# Patient Record
Sex: Female | Born: 1970 | Race: White | Hispanic: No | Marital: Married | Smoking: Never smoker
Health system: Southern US, Community
[De-identification: ages and names within clinical notes are randomized; demographics above are authoritative.]

## PROBLEM LIST (undated history)

## (undated) DIAGNOSIS — I10 Essential (primary) hypertension: Secondary | ICD-10-CM

## (undated) DIAGNOSIS — M1990 Unspecified osteoarthritis, unspecified site: Secondary | ICD-10-CM

## (undated) DIAGNOSIS — Z171 Estrogen receptor negative status [ER-]: Principal | ICD-10-CM

## (undated) DIAGNOSIS — T4145XA Adverse effect of unspecified anesthetic, initial encounter: Secondary | ICD-10-CM

## (undated) DIAGNOSIS — T8859XA Other complications of anesthesia, initial encounter: Secondary | ICD-10-CM

## (undated) DIAGNOSIS — F419 Anxiety disorder, unspecified: Secondary | ICD-10-CM

## (undated) DIAGNOSIS — C50412 Malignant neoplasm of upper-outer quadrant of left female breast: Principal | ICD-10-CM

## (undated) DIAGNOSIS — C50919 Malignant neoplasm of unspecified site of unspecified female breast: Secondary | ICD-10-CM

## (undated) DIAGNOSIS — K5900 Constipation, unspecified: Secondary | ICD-10-CM

## (undated) DIAGNOSIS — Z923 Personal history of irradiation: Secondary | ICD-10-CM

## (undated) DIAGNOSIS — F32A Depression, unspecified: Secondary | ICD-10-CM

## (undated) HISTORY — DX: Depression, unspecified: F32.A

## (undated) HISTORY — DX: Constipation, unspecified: K59.00

## (undated) HISTORY — DX: Anxiety disorder, unspecified: F41.9

## (undated) HISTORY — DX: Estrogen receptor negative status (ER-): Z17.1

## (undated) HISTORY — DX: Malignant neoplasm of upper-outer quadrant of left female breast: C50.412

## (undated) HISTORY — PX: TUBAL LIGATION: SHX77

## (undated) HISTORY — PX: ANKLE SURGERY: SHX546

## (undated) HISTORY — PX: WISDOM TOOTH EXTRACTION: SHX21

---
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
## 1999-04-13 ENCOUNTER — Inpatient Hospital Stay (HOSPITAL_COMMUNITY): Admission: AD | Admit: 1999-04-13 | Discharge: 1999-04-13 | Payer: Self-pay | Admitting: Obstetrics & Gynecology

## 1999-04-16 ENCOUNTER — Other Ambulatory Visit: Admission: RE | Admit: 1999-04-16 | Discharge: 1999-04-16 | Payer: Self-pay | Admitting: Obstetrics & Gynecology

## 1999-12-06 ENCOUNTER — Ambulatory Visit (HOSPITAL_COMMUNITY): Admission: RE | Admit: 1999-12-06 | Discharge: 1999-12-06 | Payer: Self-pay | Admitting: Obstetrics and Gynecology

## 1999-12-06 ENCOUNTER — Encounter (INDEPENDENT_AMBULATORY_CARE_PROVIDER_SITE_OTHER): Payer: Self-pay | Admitting: Specialist

## 2005-03-02 ENCOUNTER — Ambulatory Visit: Payer: Self-pay | Admitting: Cardiovascular Disease

## 2005-03-02 ENCOUNTER — Observation Stay (HOSPITAL_COMMUNITY): Admission: EM | Admit: 2005-03-02 | Discharge: 2005-03-03 | Payer: Self-pay | Admitting: Emergency Medicine

## 2005-03-03 ENCOUNTER — Encounter: Payer: Self-pay | Admitting: Cardiovascular Disease

## 2008-04-29 ENCOUNTER — Emergency Department (HOSPITAL_BASED_OUTPATIENT_CLINIC_OR_DEPARTMENT_OTHER): Admission: EM | Admit: 2008-04-29 | Discharge: 2008-04-30 | Payer: Self-pay | Admitting: Emergency Medicine

## 2008-04-30 ENCOUNTER — Ambulatory Visit: Payer: Self-pay | Admitting: Diagnostic Radiology

## 2009-01-07 ENCOUNTER — Ambulatory Visit: Payer: Self-pay | Admitting: Diagnostic Radiology

## 2009-01-07 ENCOUNTER — Emergency Department (HOSPITAL_BASED_OUTPATIENT_CLINIC_OR_DEPARTMENT_OTHER): Admission: EM | Admit: 2009-01-07 | Discharge: 2009-01-07 | Payer: Self-pay | Admitting: Emergency Medicine

## 2009-01-10 ENCOUNTER — Ambulatory Visit (HOSPITAL_BASED_OUTPATIENT_CLINIC_OR_DEPARTMENT_OTHER): Admission: RE | Admit: 2009-01-10 | Discharge: 2009-01-11 | Payer: Self-pay | Admitting: Orthopedic Surgery

## 2009-04-24 ENCOUNTER — Ambulatory Visit: Payer: Self-pay | Admitting: Diagnostic Radiology

## 2009-04-24 ENCOUNTER — Emergency Department (HOSPITAL_BASED_OUTPATIENT_CLINIC_OR_DEPARTMENT_OTHER): Admission: EM | Admit: 2009-04-24 | Discharge: 2009-04-24 | Payer: Self-pay | Admitting: Emergency Medicine

## 2010-01-04 ENCOUNTER — Emergency Department (HOSPITAL_BASED_OUTPATIENT_CLINIC_OR_DEPARTMENT_OTHER)
Admission: EM | Admit: 2010-01-04 | Discharge: 2010-01-05 | Payer: Self-pay | Source: Home / Self Care | Admitting: Emergency Medicine

## 2010-01-26 ENCOUNTER — Encounter: Payer: Self-pay | Admitting: Family Medicine

## 2010-03-17 LAB — CBC
HCT: 37.1 % (ref 36.0–46.0)
Hemoglobin: 12.8 g/dL (ref 12.0–15.0)
MCH: 28.6 pg (ref 26.0–34.0)
MCHC: 34.5 g/dL (ref 30.0–36.0)
MCV: 83 fL (ref 78.0–100.0)
Platelets: 209 10*3/uL (ref 150–400)
RBC: 4.47 MIL/uL (ref 3.87–5.11)
RDW: 13.2 % (ref 11.5–15.5)
WBC: 5.7 K/uL (ref 4.0–10.5)

## 2010-03-17 LAB — COMPREHENSIVE METABOLIC PANEL WITH GFR
ALT: 32 U/L (ref 0–35)
AST: 29 U/L (ref 0–37)
Alkaline Phosphatase: 117 U/L (ref 39–117)
BUN: 8 mg/dL (ref 6–23)
CO2: 24 meq/L (ref 19–32)
Chloride: 104 meq/L (ref 96–112)
GFR calc Af Amer: >60 mL/min (ref >60)
GFR calc non Af Amer: >60 mL/min (ref >60)
Glucose, Bld: 102 mg/dL — ABNORMAL HIGH (ref 70–99)
Total Protein: 7.5 g/dL (ref 6.0–8.3)

## 2010-03-17 LAB — DIFFERENTIAL
Basophils Absolute: 0 K/uL (ref 0.0–0.1)
Basophils Relative: 1 % (ref 0–1)
Eosinophils Absolute: 0.1 10*3/uL (ref 0.0–0.7)
Eosinophils Relative: 1 % (ref 0–5)
Lymphocytes Relative: 26 % (ref 12–46)
Lymphs Abs: 1.5 10*3/uL (ref 0.7–4.0)
Monocytes Absolute: 0.4 10*3/uL (ref 0.1–1.0)
Monocytes Relative: 8 % (ref 3–12)
Neutro Abs: 3.7 10*3/uL (ref 1.7–7.7)
Neutrophils Relative %: 65 % (ref 43–77)

## 2010-03-17 LAB — COMPREHENSIVE METABOLIC PANEL
Albumin: 4.1 g/dL (ref 3.5–5.2)
Calcium: 9.1 mg/dL (ref 8.4–10.5)
Creatinine, Ser: 0.6 mg/dL (ref 0.4–1.2)
Potassium: 4.1 mEq/L (ref 3.5–5.1)
Sodium: 139 mEq/L (ref 135–145)
Total Bilirubin: 1 mg/dL (ref 0.3–1.2)

## 2010-03-17 LAB — URINALYSIS, ROUTINE W REFLEX MICROSCOPIC
Glucose, UA: NEGATIVE mg/dL
Hgb urine dipstick: NEGATIVE
Ketones, ur: NEGATIVE mg/dL
Nitrite: NEGATIVE
Protein, ur: NEGATIVE mg/dL
Specific Gravity, Urine: 1.025 (ref 1.005–1.030)
Urobilinogen, UA: 0.2 mg/dL (ref 0.0–1.0)
pH: 5.5 (ref 5.0–8.0)

## 2010-03-17 LAB — LIPASE, BLOOD: Lipase: 103 U/L (ref 23–300)

## 2010-03-17 LAB — PREGNANCY, URINE: Preg Test, Ur: NEGATIVE

## 2010-03-25 LAB — POCT CARDIAC MARKERS
CKMB, poc: <1 ng/mL — ABNORMAL LOW (ref 1.0–8.0)
Myoglobin, poc: 50.7 ng/mL (ref 12–200)
Troponin i, poc: <0.05 ng/mL (ref 0.00–0.09)

## 2010-03-25 LAB — BASIC METABOLIC PANEL WITH GFR
BUN: 7 mg/dL (ref 6–23)
Calcium: 9.1 mg/dL (ref 8.4–10.5)
GFR calc Af Amer: >60 mL/min (ref >60)
Glucose, Bld: 102 mg/dL — ABNORMAL HIGH (ref 70–99)
Potassium: 3.6 meq/L (ref 3.5–5.1)
Sodium: 142 meq/L (ref 135–145)

## 2010-03-25 LAB — CBC
HCT: 38.7 % (ref 36.0–46.0)
Hemoglobin: 13.2 g/dL (ref 12.0–15.0)
MCHC: 34.1 g/dL (ref 30.0–36.0)
MCV: 85.3 fL (ref 78.0–100.0)
Platelets: 261 10*3/uL (ref 150–400)
RBC: 4.54 MIL/uL (ref 3.87–5.11)
RDW: 12.7 % (ref 11.5–15.5)
WBC: 5.1 K/uL (ref 4.0–10.5)

## 2010-03-25 LAB — PREGNANCY, URINE: Preg Test, Ur: NEGATIVE

## 2010-03-25 LAB — URINALYSIS, ROUTINE W REFLEX MICROSCOPIC
Glucose, UA: NEGATIVE mg/dL
Hgb urine dipstick: NEGATIVE
Ketones, ur: 15 mg/dL — AB
Nitrite: NEGATIVE
Protein, ur: NEGATIVE mg/dL
Specific Gravity, Urine: 1.031 — ABNORMAL HIGH (ref 1.005–1.030)
Urobilinogen, UA: 1 mg/dL (ref 0.0–1.0)
pH: 5.5 (ref 5.0–8.0)

## 2010-03-25 LAB — DIFFERENTIAL
Basophils Absolute: 0 K/uL (ref 0.0–0.1)
Basophils Relative: 1 % (ref 0–1)
Eosinophils Absolute: 0.1 K/uL (ref 0.0–0.7)
Eosinophils Relative: 2 % (ref 0–5)
Lymphocytes Relative: 27 % (ref 12–46)
Lymphs Abs: 1.4 10*3/uL (ref 0.7–4.0)
Monocytes Absolute: 0.3 K/uL (ref 0.1–1.0)
Monocytes Relative: 5 % (ref 3–12)
Neutro Abs: 3.3 K/uL (ref 1.7–7.7)
Neutrophils Relative %: 66 % (ref 43–77)

## 2010-03-25 LAB — URINE MICROSCOPIC-ADD ON

## 2010-03-25 LAB — BASIC METABOLIC PANEL
CO2: 29 mEq/L (ref 19–32)
Chloride: 102 mEq/L (ref 96–112)
Creatinine, Ser: 0.7 mg/dL (ref 0.4–1.2)
GFR calc non Af Amer: >60 mL/min (ref >60)

## 2010-03-25 LAB — D-DIMER, QUANTITATIVE: D-Dimer, Quant: 0.37 ug/mL-FEU (ref 0.00–0.48)

## 2010-05-23 NOTE — H&P (Signed)
[name], [name]              ACCOUNT NO.:  [id number]   MEDICAL RECORD NO.:  [id number]          PATIENT TYPE:  INP   LOCATION:  5511                         FACILITY:  [facility]   PHYSICIAN:  Charlton Haws, M.D.     DATE OF BIRTH:  10/01/1970   DATE OF ADMISSION:  03/02/2005  DATE OF DISCHARGE:                                HISTORY & PHYSICAL   CARDIOLOGIST:  New, being seen by Dr. Maurine Cane   PRIMARY CARE PHYSICIAN:  None.  Patient previously was followed by Dr. Cliffton Asters  in [place] prior to moving to [place].   She was admitted by Dr. Julio Sicks   We were asked to consult secondary to chest pain.   Ms. Luscher is a 40 year old Caucasian female with no known coronary artery  disease history who complains of chest discomfort episodes approximately for  four months, however, has postponed going to the doctor until yesterday.  She states chest discomfort is not similar to the pleuritic discomfort she  had recently with bronchitis in which she was treated with steroids and  antibiotics in December of 2006.  This discomfort is more of an intense  sharp pain associated with palpitations at times.  Yesterday she experienced  episode around 5 p.m. while she was studying.  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  She  complains of episodes of palpitations with her heart racing to the point  that she states she fell down the steps back in December with episode, but  denied any syncopal episodes with this.  Really not sure clarification of  exactly what made her fall.  Her chest discomfort yesterday was above her  left breast.  She states it is sharp pain radiating up into her neck and  down her back.  She states she did become short of breath.  She did have  palpitations with this.  She denied any lightheadedness or dizziness.  She  states she does  have a headache.  When she has the chest discomfort it feels  like an intense pounding like her blood pressure is up.  She states she  rested for a while.  The chest pain eased off.  Initially she rated it an 8-  9 on a scale of 1-10, sharp, intense pressure.  Lasted around five to six  hours.  She states it waxed and waned throughout the evening.  However, the  chest pain did not resolve until after the IV nitroglycerin was initiated in  the emergency room and it did not resolve at that time until 45 minutes  after the nitroglycerin had been initiated.  Blood pressure initially in the  emergency room was 149/97.  Patient also received Toradol in the emergency  room and sublingual nitroglycerin prior to the drip being initiated.  She  also received a Xopenex nebulizer treatment secondary to wheezing and 1 mg  of morphine IV in the emergency department.  Currently she is rating her  chest discomfort around a 3 or 4.  She is resting quietly on the bed without  any frank discomfort.   ALLERGIES:  No known drug allergies.   MEDICATIONS:  At home, none.  Here she is receiving Proventil inhaler,  aspirin 325, Toradol, Xopenex, nitroglycerin.  She also has morphine p.r.n.  IV.   PAST MEDICAL HISTORY:  1.  Pregnancy induced hypertension with episodes of preeclampsia.  Patient      states blood pressure has returned to normal since pregnancies.      Cholesterol questionable mild elevation; however, patient states it was      okay five years ago.  2.  Recent bronchitis treated with steroids and antibiotics.   SOCIAL HISTORY:  She lives in [place] with her husband.  She is a Biomedical scientist.  She has four children.  She denies any tobacco, ETOH, drug, or  herbal medication use.  She is on a regular diet.  No routine exercise.   FAMILY HISTORY:  Mother alive with hypertension, obesity.  Father alive with  CHF and atrial fibrillation, borderline diabetes.  A brother with  hypertension.    REVIEW OF SYSTEMS:  Positive for sweats, headache, chest pain, shortness of  breath, dyspnea on exertion, edema, palpitations, questionable  presyncope/syncopal episode with falling down steps.  Positive for wheezing.  Positive for nausea.   PHYSICAL EXAMINATION:  VITAL SIGNS:  Temperature 98, pulse 82, respirations  20, blood pressure 149/97, saturation 95% on room air.  GENERAL:  She is in no acute distress.  HEENT:  Pupils are equal, round, and reactive to light.  Sclera is clear.  NECK:  Without lymphadenopathy.  Negative bruit or JVD.  CARDIOVASCULAR:  Regular rate and rhythm.  S1, S2.  Pulses are 2+ and equal  without bruits.  LUNGS:  Clear to auscultation bilaterally.  ABDOMEN:  Soft, nontender.  Positive bowel sounds.  EXTREMITIES:  Without clubbing, cyanosis, edema.  She has 2+ DPs bilateral.  NEUROLOGIC:  Alert and oriented x3.  Cranial nerves II-XII grossly intact.   Chest x-ray showing mild peribronchial thickening without focal air space  disease.  EKG:  Sinus rhythm at 81 without acute changes.  Hemoglobin 13.6,  hematocrit 40.  Sodium 139, potassium 3.7, BUN 10, creatinine 0.8 with a  glucose of 115.  D-dimer less than 0.22.  Cardiac enzymes:  First set  troponin 0.01.   Dr. Maurine Cane in to examine and assess patient with atypical chest pain in  setting of recent bronchitis and increased psychosocial stress at home, job,  and school.   PLAN:  Rule out myocardial infarction.  Discontinue her nitroglycerin.  We  will use Percocet and Toradol for pain.  Will plan on obtaining a CT of the  chest and echocardiogram today and doing a stress Myoview inpatient  tomorrow.      Dorian Pod, NP    ______________________________  Charlton Haws, M.D.    MB/MEDQ  D:  03/02/2005  T:  03/02/2005  Job:  [id number]

## 2010-05-23 NOTE — Op Note (Signed)
[facility]  Patient:    [name], [name]                          MRN: [id number] Proc. Date: 12/06/99 Adm. Date:  [id number] Attending:  Miguel Aschoff                           Operative Report  PREOPERATIVE DIAGNOSIS:       Missed abortion.  POSTOPERATIVE DIAGNOSIS:      Missed abortion.  OPERATION:                    Suction dilation and curettage.  SURGEON:                      Dr. Miguel Aschoff.  ANESTHESIA:                   IV sedation with paracervical block.  COMPLICATIONS:                None.  ESTIMATED BLOOD LOSS:         Approximately 50 cc.  INDICATIONS:                  Patient is a 40 year old white female, gravida 4, para 2-0-1-2 now approximately eight to nine weeks pregnant. The patient has had serial HCG values drawn which have fallen. Follow-up ultrasound has confirmed the presence on an an-embryonic pregnancy. In view of the falling HCGs and the abnormal ultrasound she presents now to undergo suction D&C to evacuate the uterus of this abnormal pregnancy.  DESCRIPTION OF PROCEDURE:     Patient was taken to the operating room, placed in the supine position, and IV sedation was administered. She was then placed in the dorsal lithotomy position and prepped and draped in the usual sterile fashion. Speculum was placed in the vaginal vault and anterior cervical lip was grasped with the tenaculum and then injected with 18 cc 1% Xylocaine by placing 6 cc at the 12 oclock, 4 oclock and 8 oclock positions. After this was done.  Serial Pratt dilators were used until the endocervical canal was dilated up to a 25 Pratt dilator; then a #8 vacuum curet was placed in the uterine cavity and contents of the uterus were evacuated without difficulty. After this was done sharp curettage was carried out yielding the small amount of additional tissue then a final pass was made with the vacuum curet. At this point with no additional tissue being returned  and good hemostasis, all instruments were removed, the patient was taken out of the lithotomy position and brought to the recovery room in satisfactory condition.  Plan is for the patient is to be discharged home. Medications for home include Darvocet-N 100 one every six hours as needed for pain and doxycycline 100 mg twice a day x 3 days. She is to call if there are any problems such as fever, pain, or heavy bleeding. She is instructed to set up a follow-up visit in the office for four weeks. DD:  12/06/99 TD:  12/06/99 Job: [id number]

## 2010-05-23 NOTE — Consult Note (Signed)
[name], [name]              ACCOUNT NO.:  [id number]   MEDICAL RECORD NO.:  [id number]          PATIENT TYPE:  INP   LOCATION:  5511                         FACILITY:  [facility]   PHYSICIAN:  Charlton Haws, M.D.     DATE OF BIRTH:  10/14/1970   DATE OF CONSULTATION:  DATE OF DISCHARGE:                                   CONSULTATION   Consult for chest pain.   [name] is a 40 year old patient admitted earlier this morning by  primary care.   The patient had substernal chest pain.   Her only past medical history is really some preeclampsia with one of her  pregnancies.  She has four healthy children.   The patient has been recovering from some bronchitis and completed a course  of antibiotics and steroids recently.   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   The pain is atypical for angina, it is not necessarily exertional, it has  been consistent and not really relieved with nitro.  She seems to get better  relief with Toradol.   On evaluation in the ER her enzymes are negative, her D-Dimer is less than  0.22.   She is also noted on review of systems increasing palpitations.   PAST MEDICAL HISTORY:  Remarkable for recent bronchitis treated with  antibiotics and steroids, pregnancy induced hypertension.   The patient has no known allergies.   She denies any tobacco, alcohol or drug use.   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   EXAM:  She is afebrile, heart rate is 90 and regular, blood pressure is  currently 148/90, sats are 95% on room air.  LUNGS:  Clear.  Carotids normal.  S1, S2  without murmur, rub, gallop, click.  ABDOMEN:  Benign.  EXTREMITIES:  Intact pulses, no edema.   Lab work is unremarkable.  She has ruled out for myocardial infarction.  D-  Dimer is less than 0.2.  Chest x-ray shows mild bronchitis and no focal  infiltrates.  EKG essentially shows sinus rhythm at 81 and is normal.   IMPRESSION:  I do not think the patient's pain is cardiac in etiology  however, it was bad enough for her to come to the hospital.   I think it is reasonable to check a 2D echocardiogram and also a chest CT to  rule out noncardiac causes of her pain, so long as these are normal, she can  be scheduled for a stress Myoview in the morning.   I would stop her IV nitroglycerin for the time being, I would treat her pain  with Darvocet and Toradol.   Further recommendations will be based on the results of her echo  and stress  test.   I suspect that the patient is musculoskeletal or pleuritic in nature from  her recent bronchitis and increased anxiety and stress levels.           ______________________________  Charlton Haws, M.D.     PN/MEDQ  D:  03/02/2005  T:  03/02/2005  Job:  [id number]

## 2010-05-23 NOTE — H&P (Signed)
[name], [name]              ACCOUNT NO.:  [id number]   MEDICAL RECORD NO.:  [id number]          PATIENT TYPE:  EMS   LOCATION:  MAJO                         FACILITY:  [facility]   PHYSICIAN:  Hollice Espy, M.D.DATE OF BIRTH:  August 24, 1970   DATE OF ADMISSION:  03/02/2005  DATE OF DISCHARGE:                                HISTORY & PHYSICAL   ATTENDING PHYSICIAN:  Dr. Greggory Stallion Osei-Bonsu   CHIEF COMPLAINT:  Chest pressure.   HISTORY OF PRESENT ILLNESS:  Patient is a 40 year old white female with  essentially no past medical history other than preeclampsia about a year ago  who presents to the emergency room after an episode of chest tightness.  Patient lately has been recovering from a case of bronchitis and completed a  course of antibiotics and steroids for that.  She has felt that she has not  fully recovered from this; however, she says the episodes of chest pressure  and shortness of breath were different than her previous episode of  bronchitis.  She describes it as a chest pressure, about a 6 or 7 out of 10  starting over her left breast and feeling it up to her left shoulder and  left side of neck.  She said that she felt some associated shortness of  breath with it, as well, she felt some nausea.  She became concerned and  came into the emergency room for further evaluation.  There she had a chest  x-ray done which showed bronchitis.  She initially received a dose of  Toradol which she said actually felt like the chest pressure was worsened  and was to an 8 or 9 out of 10.  She then received some nitroglycerin which  greatly improved her symptoms and made it down to about a 2/10.  Currently  she says she feels some residual chest pressure but it is more lower and  lateral on her left breast, more underneath then radiating down to her left  axilla.  She says overall her symptoms feel much better.  She no longer  feels any shortness of breath now.  Laboratory work is done  on the patient.  Patient had an essentially normal EKG, cardiac enzymes, and her D-dimer is  less than 0.22.  Given this history it was felt the patient might be best  evaluated to rule out any type of cardiac issue.  Currently is doing well.  She denies any headaches or vision changes, dysphagia.  No current chest  pain.  Minor chest pressure.  No palpitations.  No wheezing, coughing.  No  abdominal pain.  No hematuria, dysuria, constipation, diarrhea, focal  extremity numbness, weakness, or pain.   REVIEW OF SYSTEMS:  Otherwise negative.   PAST MEDICAL HISTORY:  1.  Recent case of bronchitis.  2.  Pregnancy induced hypertension.   MEDICATIONS:  Patient just finished a dose of steroids and Biaxin, but not  on any long-term medications.   ALLERGIES:  None.   SOCIAL HISTORY:  Patient denies any tobacco, alcohol, or drug use.  She  lives at home with her husband and four  kids.   FAMILY HISTORY:  Noted for extensive CAD.   PHYSICAL EXAMINATION:  VITAL SIGNS:  Temperature 98, heart rate 95, blood  pressure 149/97, respirations 22, O2 saturation 95% on room air.  Following  improvement in patient's chest pressure her blood pressure has come down to  107/61.  GENERAL:  Patient is alert and oriented x3.  No apparent distress.  HEENT:  Normocephalic, atraumatic.  Mucous membranes are moist.  She has no  carotid bruit.  HEART:  Regular rate and rhythm with a very, very subtle 1-2/6 systolic  ejection murmur.  LUNGS:  Clear to auscultation bilaterally, although the ER attending  reported wheezing earlier.  ABDOMEN:  Soft, nontender, nondistended.  Positive bowel sounds.  EXTREMITIES:  No clubbing, cyanosis, edema.   LABORATORIES:  D-dimer less than 0.22.  Sodium 139, potassium 3.7, chloride  106, bicarbonate 25, BUN 10, creatinine 0.8, glucose 115.  CPK 49.6, MB less  than 1, troponin I less than 0.05.  Chest x-ray shows signs of mild  bronchitis and no focal infiltrate.    ASSESSMENT/PLAN:  1.  Chest pressure.  It is possible this may be related to some residual      bronchitis.  Will go ahead and check an ABG but given patient's fairly      good history as well as a family history, and she is slightly on the      obese side, will go ahead and admit her for an observation to rule out      any type of cardiac issue.  She will have two more sets of cardiac      enzymes, check a fasting lipid profile, and get a treadmill stress test.      Hollice Espy, M.D.  Electronically Signed     SKK/MEDQ  D:  03/02/2005  T:  03/02/2005  Job:  [id number]

## 2011-01-27 ENCOUNTER — Emergency Department (HOSPITAL_BASED_OUTPATIENT_CLINIC_OR_DEPARTMENT_OTHER)
Admission: EM | Admit: 2011-01-27 | Discharge: 2011-01-27 | Disposition: A | Discharge status: Home / Self Care | Payer: Self-pay | Attending: Emergency Medicine | Admitting: Emergency Medicine

## 2011-01-27 ENCOUNTER — Encounter (HOSPITAL_BASED_OUTPATIENT_CLINIC_OR_DEPARTMENT_OTHER): Payer: Self-pay

## 2011-01-27 ENCOUNTER — Emergency Department (INDEPENDENT_AMBULATORY_CARE_PROVIDER_SITE_OTHER): Payer: Self-pay

## 2011-01-27 DIAGNOSIS — R059 Cough, unspecified: Secondary | ICD-10-CM

## 2011-01-27 DIAGNOSIS — R05 Cough: Secondary | ICD-10-CM

## 2011-01-27 DIAGNOSIS — J3489 Other specified disorders of nose and nasal sinuses: Secondary | ICD-10-CM

## 2011-01-27 DIAGNOSIS — J329 Chronic sinusitis, unspecified: Secondary | ICD-10-CM | POA: Insufficient documentation

## 2011-01-27 DIAGNOSIS — R509 Fever, unspecified: Secondary | ICD-10-CM

## 2011-01-27 MED ORDER — FLUTICASONE PROPIONATE 50 MCG/ACT NA SUSP: 2.0000 | Freq: Every day | NASAL | Status: DC

## 2011-01-27 MED ORDER — AMOXICILLIN 500 MG PO CAPS: 500.0000 mg | ORAL_CAPSULE | Freq: Three times a day (TID) | ORAL | Status: AC

## 2011-01-27 NOTE — ED Notes (Signed)
Pt states she has been coughing and has had no relief with otc medications.

## 2011-01-27 NOTE — ED Provider Notes (Signed)
History     CSN: [id number]  Arrival date & time 01/27/11  1654   First MD Initiated Contact with Patient 01/27/11 1709      Chief Complaint  Patient presents with  . Cough    (Consider location/radiation/quality/duration/timing/severity/associated sxs/prior treatment) Patient is a 41 y.o. female presenting with cough. The history is provided by the patient. No language interpreter was used.  Cough This is a new problem. The current episode started more than 2 days ago. The problem occurs constantly. The problem has not changed since onset.The cough is productive of sputum. The maximum temperature recorded prior to her arrival was 100 to 100.9 F. Associated symptoms include chills, headaches and rhinorrhea. Pertinent negatives include no sore throat and no shortness of breath. She has tried decongestants for the symptoms. She is not a smoker.    History reviewed. No pertinent past medical history.  Past Surgical History  Procedure Date  . Ankle surgery   . Tubal ligation     No family history on file.  History  Substance Use Topics  . Smoking status: Former Games developer  . Smokeless tobacco: Not on file  . Alcohol Use: No    OB History    Grav Para Term Preterm Abortions TAB SAB Ect Mult Living                  Review of Systems  Constitutional: Positive for chills.  HENT: Positive for rhinorrhea. Negative for sore throat.   Respiratory: Positive for cough. Negative for shortness of breath.   Neurological: Positive for headaches.    Allergies  Review of patient's allergies indicates no known allergies.  Home Medications   Current Outpatient Rx  Name Route Sig Dispense Refill  . ACETAMINOPHEN 500 MG PO TABS Oral Take 1,000 mg by mouth every 6 (six) hours as needed. For fever/pain    . DM-PHENYLEPHRINE-ACETAMINOPHEN 10-5-325 MG PO CAPS Oral Take 2 capsules by mouth every 6 (six) hours as needed. For congestion    . ZICAM COLD REMEDY NA GEL Nasal Place into the nose  2 (two) times daily as needed. For congestion    . IBUPROFEN 200 MG PO TABS Oral Take 200 mg by mouth every 6 (six) hours as needed. For congestion      BP 151/96  Pulse 97  Temp(Src) 98.4 F (36.9 C) (Oral)  Resp 16  Ht 5\' 7"  (1.702 m)  Wt 259 lb (117.482 kg)  BMI 40.57 kg/m2  SpO2 100%  LMP 01/12/2011  Physical Exam  Nursing note and vitals reviewed. Constitutional: She is oriented to person, place, and time. She appears well-developed and well-nourished.  HENT:  Right Ear: Tympanic membrane normal.  Left Ear: Tympanic membrane normal.  Nose: Right sinus exhibits frontal sinus tenderness. Left sinus exhibits frontal sinus tenderness.  Eyes: Conjunctivae and EOM are normal.  Cardiovascular: Normal rate and regular rhythm.   Pulmonary/Chest: Effort normal and breath sounds normal.  Musculoskeletal: Normal range of motion.  Neurological: She is alert and oriented to person, place, and time.  Skin: Skin is warm.    ED Course  Procedures (including critical care time)  Labs Reviewed - No data to display Dg Chest 2 View  01/27/2011  *RADIOLOGY REPORT*  Clinical Data: Cough, fever and sinus pain.  CHEST - 2 VIEW  Comparison: 04/24/2009.  Findings: Trachea is midline.  Heart size normal.  Lungs are clear. No pleural fluid.  IMPRESSION: No acute findings.  Original Report Authenticated By: Reyes Ivan, M.D.  1. Sinusitis       MDM  Chest x-ray negative:will treat for sinusitis        Teressa Lower, NP 01/27/11 1759

## 2011-01-27 NOTE — ED Notes (Signed)
Pt c/o productive cough, fever and sinus pain onset last Thursday.  Pt using Alka Seltzer, zycam and Tylenol for relief at home.

## 2011-01-28 NOTE — ED Provider Notes (Signed)
Medical screening examination/treatment/procedure(s) were performed by non-physician practitioner and as supervising physician I was immediately available for consultation/collaboration.   Brooklin Rieger A. Aleea Hendry, MD 01/28/11 0009 

## 2016-05-16 ENCOUNTER — Encounter (HOSPITAL_BASED_OUTPATIENT_CLINIC_OR_DEPARTMENT_OTHER): Payer: Self-pay | Admitting: *Deleted

## 2016-05-16 ENCOUNTER — Emergency Department (HOSPITAL_BASED_OUTPATIENT_CLINIC_OR_DEPARTMENT_OTHER)
Admission: EM | Admit: 2016-05-16 | Discharge: 2016-05-16 | Disposition: A | Discharge status: Home / Self Care | Payer: Self-pay | Attending: Emergency Medicine | Admitting: Emergency Medicine

## 2016-05-16 DIAGNOSIS — R5383 Other fatigue: Secondary | ICD-10-CM

## 2016-05-16 DIAGNOSIS — Z87891 Personal history of nicotine dependence: Secondary | ICD-10-CM | POA: Insufficient documentation

## 2016-05-16 DIAGNOSIS — N63 Unspecified lump in unspecified breast: Secondary | ICD-10-CM

## 2016-05-16 DIAGNOSIS — N6320 Unspecified lump in the left breast, unspecified quadrant: Secondary | ICD-10-CM | POA: Insufficient documentation

## 2016-05-16 LAB — CBC WITH DIFFERENTIAL/PLATELET
Basophils Absolute: 0 10*3/uL (ref 0.0–0.1)
Basophils Relative: 1 %
EOS ABS: 0.2 10*3/uL (ref 0.0–0.7)
EOS PCT: 3 %
HCT: 35.4 % — ABNORMAL LOW (ref 36.0–46.0)
HEMOGLOBIN: 12.1 g/dL (ref 12.0–15.0)
Lymphocytes Relative: 26 %
Lymphs Abs: 1.7 10*3/uL (ref 0.7–4.0)
MCH: 29.5 pg (ref 26.0–34.0)
MCHC: 34.2 g/dL (ref 30.0–36.0)
MCV: 86.3 fL (ref 78.0–100.0)
Monocytes Absolute: 0.5 10*3/uL (ref 0.1–1.0)
Monocytes Relative: 8 %
Neutro Abs: 4.2 10*3/uL (ref 1.7–7.7)
Neutrophils Relative %: 62 %
PLATELETS: 219 10*3/uL (ref 150–400)
RBC: 4.1 MIL/uL (ref 3.87–5.11)
RDW: 12.8 % (ref 11.5–15.5)
WBC: 6.6 10*3/uL (ref 4.0–10.5)

## 2016-05-16 LAB — BASIC METABOLIC PANEL
Anion gap: 7 (ref 5–15)
BUN: 8 mg/dL (ref 6–20)
CO2: 26 mmol/L (ref 22–32)
Calcium: 8.8 mg/dL — ABNORMAL LOW (ref 8.9–10.3)
Chloride: 104 mmol/L (ref 101–111)
Creatinine, Ser: 0.63 mg/dL (ref 0.44–1.00)
GFR calc Af Amer: >60 mL/min (ref >60)
Glucose, Bld: 96 mg/dL (ref 65–99)
POTASSIUM: 3.6 mmol/L (ref 3.5–5.1)
Sodium: 137 mmol/L (ref 135–145)

## 2016-05-16 LAB — URINALYSIS, ROUTINE W REFLEX MICROSCOPIC
Bilirubin Urine: NEGATIVE
Glucose, UA: NEGATIVE mg/dL
Hgb urine dipstick: NEGATIVE
Ketones, ur: NEGATIVE mg/dL
Leukocytes, UA: NEGATIVE
Nitrite: NEGATIVE
PROTEIN: NEGATIVE mg/dL
Specific Gravity, Urine: 1.023 (ref 1.005–1.030)
pH: 5.5 (ref 5.0–8.0)

## 2016-05-16 NOTE — ED Provider Notes (Signed)
[facility] DEPT Provider Note   CSN: [id number] Arrival date & time: 05/16/16  1618     History   Chief Complaint Chief Complaint  Patient presents with  . Breast Pain    HPI [name] is a 46 y.o. female.  Patient presents with complaint of "not feeling good" for the past 2-3 days. She cannot give specifics except for that she feels tired and does not have a lot of energy. She denies any fevers, URI symptoms, chest pain, shortness of breath, cough, nausea, vomiting, diarrhea, abdominal pain, urinary symptoms. She does note that she first noted a tender lump in her lateral left breast starting yesterday. She states that this area is not been particularly red or warm. Patient is tearful during exam and states that she has been under a lot of stress recently. 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 She has not seen a primary care physician in some time and has not had a routine physical, stating that she cannot take care of herself because she has to many other responsibilities with family. The onset of this condition was acute. The course is constant. Aggravating factors: none. Alleviating factors: none.        History reviewed. No pertinent past medical history.  There are no active problems to display for this patient.   Past Surgical History:  Procedure Laterality Date  . ANKLE SURGERY    . TUBAL LIGATION      OB History    No data available       Home Medications    Prior to Admission medications   Medication Sig Start Date End Date Taking? Authorizing Provider  acetaminophen (TYLENOL) 500 MG tablet Take 1,000 mg by mouth every 6 (six) hours as needed. For fever/pain    [provider]  DM-Phenylephrine-Acetaminophen (ALKA-SELTZER PLUS DAY COLD/FLU) 10-5-325 MG CAPS Take 2 capsules by mouth every 6 (six) hours as needed. For congestion    [provider]  fluticasone (FLONASE)  50 MCG/ACT nasal spray Place 2 sprays into the nose daily. 01/27/11 01/27/12  Glendell Docker, NP  Homeopathic Products [facility] COLD REMEDY) GEL Place into the nose 2 (two) times daily as needed. For congestion    [provider]  ibuprofen (ADVIL,MOTRIN) 200 MG tablet Take 200 mg by mouth every 6 (six) hours as needed. For congestion    [provider]    Family History History reviewed. No pertinent family history.  Social History Social History  Substance Use Topics  . Smoking status: Former Research scientist (life sciences)  . Smokeless tobacco: Not on file  . Alcohol use No     Allergies   Patient has no known allergies.   Review of Systems Review of Systems  Constitutional: Positive for fatigue. Negative for fever.  HENT: Negative for rhinorrhea and sore throat.   Eyes: Negative for redness.  Respiratory: Negative for cough.   Cardiovascular: Positive for chest pain (tender breast lump).  Gastrointestinal: Negative for abdominal pain, diarrhea, nausea and vomiting.  Genitourinary: Negative for dysuria.  Musculoskeletal: Negative for myalgias.  Skin: Negative for rash.  Neurological: Negative for headaches.     Physical Exam Updated Vital Signs BP 136/84 (BP Location: Right Arm)   Pulse 73   Temp 98.9 F (37.2 C) (Oral)   Resp 18   Ht 5\' 7"  (1.702 m)   Wt 259 lb (117.5 kg)   LMP 03/16/2016   SpO2 98%  BMI 40.57 kg/m   Physical Exam  Constitutional: She appears well-developed and well-nourished.  HENT:  Head: Normocephalic and atraumatic.  Mouth/Throat: Oropharynx is clear and moist.  Eyes: Conjunctivae are normal.  Neck: Normal range of motion. Neck supple.  Pulmonary/Chest: No respiratory distress.  Neurological: She is alert.  Skin: Skin is warm and dry.  Patient with 3cm soft left lateral breast mass. No nipple discharge. No overlying redness. No significant tenderness. Most consistent with fibrocystic change.  Psychiatric: Her mood appears anxious. Her  affect is labile.  Patient tearful  Nursing note and vitals reviewed.    ED Treatments / Results  Labs (all labs ordered are listed, but only abnormal results are displayed) Labs Reviewed  CBC WITH DIFFERENTIAL/PLATELET - Abnormal; Notable for the following:       Result Value   HCT 35.4 (*)    All other components within normal limits  BASIC METABOLIC PANEL - Abnormal; Notable for the following:    Calcium 8.8 (*)    All other components within normal limits  URINALYSIS, ROUTINE W REFLEX MICROSCOPIC - Abnormal; Notable for the following:    Color, Urine AMBER (*)    APPearance CLOUDY (*)    All other components within normal limits    Procedures Procedures (including critical care time)  Medications Ordered in ED Medications - No data to display   Initial Impression / Assessment and Plan / ED Course  I have reviewed the triage vital signs and the nursing notes.  Pertinent labs & imaging results that were available during my care of the patient were reviewed by me and considered in my medical decision making (see chart for details).     Patient seen and examined. Discussed workup with patient.  Vital signs reviewed and are as follows: BP 136/84 (BP Location: Right Arm)   Pulse 73   Temp 98.9 F (37.2 C) (Oral)   Resp 18   Ht 5\' 7"  (1.702 m)   Wt 259 lb (117.5 kg)   LMP 03/16/2016   SpO2 98%   BMI 40.57 kg/m   4:06 PM Breast exam performed with nurse tech chaperone.  Regarding this, encouraged PCP follow-up. Referrals given. Low suspicion for breast cancer given exam, however I feel be important for patient to follow-up for recheck to ensure  area is improving or stable.  Discussed that stress may be contributing to her current symptoms and I encouraged her to find time to relax and time for herself. We did discuss that her workup today is not complete and that she would benefit from PCP follow-up and general routine physical to ensure no other contributing  problems such as low vitamin levels or low thyroid recurring.  She verbalizes understanding and agrees with plan. Final Clinical Impressions(s) / ED Diagnoses   Final diagnoses:  Fatigue, unspecified type  Lump or mass in breast   Findings as above. Patient appears well.  No dangerous or life-threatening conditions suspected or identified by history, physical exam, and by work-up. No indications for hospitalization identified.    New Prescriptions Discharge Medication List as of 05/16/2016  6:01 PM       Carlisle Cater, Hershal Coria 05/16/16 1805    Tanna Furry, MD 05/22/16 [phone]

## 2016-05-16 NOTE — Discharge Instructions (Signed)
Please read and follow all provided instructions.  Your diagnoses today include:  1. Fatigue, unspecified type   2. Lump or mass in breast     Tests performed today include:  Blood counts and electrolytes, urine test - no significant findings  Vital signs. See below for your results today.   Medications prescribed:   None  Take any prescribed medications only as directed.  Home care instructions:  Follow any educational materials contained in this packet.  Follow-up instructions: Please follow-up with your primary care provider in the next 1-2 weeks for further evaluation of your symptoms.   You should also have your breast lump rechecked. You may need an ultrasound or mammogram for further evaluation if it does not go away.  Return instructions:   Please return to the Emergency Department if you experience worsening symptoms.   Please return if you have any other emergent concerns.  Additional Information:  Your vital signs today were: BP (!) 163/76 (BP Location: Left Arm)    Pulse 73    Temp 98.9 F (37.2 C) (Oral)    Resp 20    Ht 5\' 7"  (1.702 m)    Wt 117.5 kg    LMP 03/16/2016    SpO2 99%    BMI 40.57 kg/m  If your blood pressure (BP) was elevated above 135/85 this visit, please have this repeated by your doctor within one month. --------------

## 2016-05-16 NOTE — ED Notes (Signed)
Pt states she has not felt well for a couple of weeks. Noticed knot to left breast x couple days. Has not had a mammogram for about 14 years. Also c/o low back pain.

## 2016-05-16 NOTE — ED Triage Notes (Signed)
Pt c/o left breast pain with lump x 1 day

## 2016-05-16 NOTE — ED Notes (Signed)
ED Provider at bedside. 

## 2016-05-22 ENCOUNTER — Ambulatory Visit: Payer: Self-pay | Attending: Internal Medicine | Admitting: Physician Assistant

## 2016-05-22 VITALS — BP 149/92 | HR 83 | Temp 98.6°F | Resp 16 | Wt 256.8 lb

## 2016-05-22 DIAGNOSIS — R030 Elevated blood-pressure reading, without diagnosis of hypertension: Secondary | ICD-10-CM

## 2016-05-22 DIAGNOSIS — R5383 Other fatigue: Secondary | ICD-10-CM

## 2016-05-22 DIAGNOSIS — N6320 Unspecified lump in the left breast, unspecified quadrant: Secondary | ICD-10-CM

## 2016-05-22 DIAGNOSIS — Z79899 Other long term (current) drug therapy: Secondary | ICD-10-CM | POA: Insufficient documentation

## 2016-05-22 NOTE — Progress Notes (Signed)
Patient ID: [name], female   DOB: 04-15-1970, 46 y.o.   MRN: [id number]    [name], is a 46 y.o. female  YIF:[id number]  OMV:[id number]  DOB - 07-31-1970  Subjective:  Chief Complaint and HPI: [name] is a 46 y.o. female here today to establish care and for a follow up visitAfter being seen in the ED 05/16/2016 for fatigue and c/o L breast mass. She noticed the area on her breast ~ 2 weeks ago and it seems to be getting bigger.  It is tender.  Her periods are irregular and they have been since onset of menses.  BTL for [facility].  LMP ~months ago.  Her last MMG was about 14 years ago.  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 Her dad passed away about 1 year ago from [place].  Mom has no h/o breast CA.    ED/Hospital notes reviewed.    ROS:   Constitutional:  No f/c, No night sweats, No unexplained weight loss. +fatigue EENT:  No vision changes, No blurry vision, No hearing changes. No mouth, throat, or ear problems.  Respiratory: No cough, No SOB Cardiac: No CP, no palpitations GI:  No abd pain, No N/V/D. GU: No Urinary s/sx Musculoskeletal: No joint pain Neuro: No headache, no dizziness, no motor weakness.  Skin: No rash Endocrine:  No polydipsia. No polyuria.  Psych: Denies SI/HI  No problems updated.  ALLERGIES: No Known Allergies  PAST MEDICAL HISTORY: No past medical history on file.  MEDICATIONS AT HOME: Prior to Admission medications   Medication Sig Start Date End Date Taking? Authorizing Provider  acetaminophen (TYLENOL) 500 MG tablet Take 1,000 mg by mouth every 6 (six) hours as needed. For fever/pain    [provider]  DM-Phenylephrine-Acetaminophen (ALKA-SELTZER PLUS DAY COLD/FLU) 10-5-325 MG CAPS Take 2 capsules by mouth every 6 (six) hours as needed. For congestion    [provider]  fluticasone (FLONASE) 50 MCG/ACT nasal spray Place 2 sprays into the nose daily. 01/27/11  01/27/12  Glendell Docker, NP  Homeopathic Products [facility] COLD REMEDY) GEL Place into the nose 2 (two) times daily as needed. For congestion    [provider]  ibuprofen (ADVIL,MOTRIN) 200 MG tablet Take 200 mg by mouth every 6 (six) hours as needed. For congestion    [provider]     Objective:  EXAM:   Vitals:   05/22/16 1446  BP: (!) 149/92  Pulse: 83  Resp: 16  Temp: 98.6 F (37 C)  TempSrc: Oral  SpO2: 98%  Weight: 256 lb 12.8 oz (116.5 kg)    General appearance : A&OX3. NAD. Non-toxic-appearing HEENT: Atraumatic and Normocephalic.  PERRLA. EOM intact.  TM clear B. Mouth-MMM, post pharynx WNL w/o erythema, No PND. Neck: supple, no JVD. No cervical lymphadenopathy. No thyromegaly Chest/Lungs:  Breathing-non-labored, Good air entry bilaterally, breath sounds normal without rales, rhonchi, or wheezing  CVS: S1 S2 regular, no murmurs, gallops, rubs  B breasts and axillae examined.  Skin is warm dry and intact with no concerning findings.  R breast with no areas of concern and axilla unremarkable.  L breat-there is an area in the L breast at ~3 o'clock that feels dense c/w fibrocystic changes-there is no discretet/firm/fixed mass.  L axilla unremarkable.  B breast and axilla examined both sitting and lying.   Extremities: Bilateral Lower Ext shows no edema, both legs are warm to touch with =  pulse throughout Neurology:  CN II-XII grossly intact, Non focal.   Psych:  TP linear. J/I WNL. Normal speech. Appropriate eye contact and affect.  Skin:  No Rash  Data Review No results found for: HGBA1C   Assessment & Plan   1. Left breast mass Most consistent with fibrocystic changes by palpation - MM Digital Diagnostic Unilat L; Future  2. Fatigue, unspecified type - Vitamin D, 25-hydroxy - TSH  3. Elevated BP without diagnosis of hypertension Check BP OOO 3-5 times weekly and record and bring to next visit.    Patient have been counseled extensively  about nutrition and exercise  Return in about 3 weeks (around 06/12/2016) for assign PCP; CPE and f/up fatigue/breast lump.  The patient was given clear instructions to go to ER or return to medical center if symptoms don't improve, worsen or new problems develop. The patient verbalized understanding. The patient was told to call to get lab results if they haven't heard anything in the next week.   Freeman Caldron, PA-C [facility] and [facility] [place], [place]   05/22/2016, 4:41 PM

## 2016-05-23 LAB — TSH: TSH: 1.55 u[IU]/mL (ref 0.450–4.500)

## 2016-05-23 LAB — VITAMIN D 25 HYDROXY (VIT D DEFICIENCY, FRACTURES): VIT D 25 HYDROXY: 28.4 ng/mL — AB (ref 30.0–100.0)

## 2016-05-25 ENCOUNTER — Other Ambulatory Visit: Payer: Self-pay | Admitting: Physician Assistant

## 2016-05-25 ENCOUNTER — Other Ambulatory Visit: Payer: Self-pay

## 2016-05-25 DIAGNOSIS — N6320 Unspecified lump in the left breast, unspecified quadrant: Secondary | ICD-10-CM

## 2016-05-27 ENCOUNTER — Ambulatory Visit: Payer: Self-pay

## 2016-05-27 ENCOUNTER — Telehealth: Payer: Self-pay

## 2016-05-27 NOTE — Telephone Encounter (Signed)
Contacted pt to go over lab results pt didn't answer lvm asking pt to give me a call at her earliest convenience   If pt calls back please give results: vitamin D is low. She can take 2000units vitamin D daily over the counter. This can contribute to muscle aches, anxiety, fatigue, and depression. We will recheck this level in 3-4 months. Her thyroid test was normal.

## 2016-05-29 ENCOUNTER — Other Ambulatory Visit (HOSPITAL_COMMUNITY): Payer: Self-pay | Admitting: *Deleted

## 2016-05-29 DIAGNOSIS — N6310 Unspecified lump in the right breast, unspecified quadrant: Secondary | ICD-10-CM

## 2016-06-12 ENCOUNTER — Encounter: Payer: Self-pay | Admitting: Internal Medicine

## 2016-06-12 ENCOUNTER — Ambulatory Visit: Payer: Medicaid Other | Attending: Internal Medicine | Admitting: Internal Medicine

## 2016-06-12 VITALS — BP 142/90 | HR 60 | Temp 98.0°F | Resp 12 | Ht 67.0 in | Wt 260.4 lb

## 2016-06-12 DIAGNOSIS — Z0001 Encounter for general adult medical examination with abnormal findings: Secondary | ICD-10-CM | POA: Diagnosis not present

## 2016-06-12 DIAGNOSIS — I10 Essential (primary) hypertension: Secondary | ICD-10-CM | POA: Insufficient documentation

## 2016-06-12 DIAGNOSIS — R600 Localized edema: Secondary | ICD-10-CM | POA: Insufficient documentation

## 2016-06-12 DIAGNOSIS — Z0000 Encounter for general adult medical examination without abnormal findings: Secondary | ICD-10-CM | POA: Insufficient documentation

## 2016-06-12 DIAGNOSIS — N6320 Unspecified lump in the left breast, unspecified quadrant: Secondary | ICD-10-CM | POA: Diagnosis not present

## 2016-06-12 DIAGNOSIS — N926 Irregular menstruation, unspecified: Secondary | ICD-10-CM

## 2016-06-12 DIAGNOSIS — Z1211 Encounter for screening for malignant neoplasm of colon: Secondary | ICD-10-CM

## 2016-06-12 DIAGNOSIS — E559 Vitamin D deficiency, unspecified: Secondary | ICD-10-CM

## 2016-06-12 DIAGNOSIS — R7989 Other specified abnormal findings of blood chemistry: Secondary | ICD-10-CM

## 2016-06-12 DIAGNOSIS — Z131 Encounter for screening for diabetes mellitus: Secondary | ICD-10-CM

## 2016-06-12 DIAGNOSIS — N925 Other specified irregular menstruation: Secondary | ICD-10-CM | POA: Diagnosis not present

## 2016-06-12 DIAGNOSIS — Z87891 Personal history of nicotine dependence: Secondary | ICD-10-CM | POA: Insufficient documentation

## 2016-06-12 DIAGNOSIS — Z6841 Body Mass Index (BMI) 40.0 and over, adult: Secondary | ICD-10-CM | POA: Insufficient documentation

## 2016-06-12 DIAGNOSIS — N6321 Unspecified lump in the left breast, upper outer quadrant: Secondary | ICD-10-CM | POA: Diagnosis not present

## 2016-06-12 LAB — POCT GLYCOSYLATED HEMOGLOBIN (HGB A1C): HEMOGLOBIN A1C: 5.2

## 2016-06-12 MED ORDER — HYDROCHLOROTHIAZIDE 25 MG PO TABS: 12.5000 mg | ORAL_TABLET | Freq: Every day | ORAL | 3 refills | Status: DC

## 2016-06-12 MED FILL — HYDROCHLOROTHIAZIDE 25 MG T: 25 | 30 days supply | Qty: 15 | Fill #0 | Status: TO

## 2016-06-12 NOTE — Patient Instructions (Addendum)
Give appointment with [name].   Please return to lab after being on blood pressure medication for 1 week. Check blood pressure 2-3 times a week and bring in readings with you  Please keep calendar of when your menses begins and ends and how heavy or light it is.   You can take Vitamin D 400 IU daily.  Purchase over the counter.    DASH Eating Plan DASH stands for "Dietary Approaches to Stop Hypertension." The DASH eating plan is a healthy eating plan that has been shown to reduce high blood pressure (hypertension). It may also reduce your risk for type 2 diabetes, heart disease, and stroke. The DASH eating plan may also help with weight loss. What are tips for following this plan? General guidelines  Avoid eating more than 2,300 mg (milligrams) of salt (sodium) a day. If you have hypertension, you may need to reduce your sodium intake to 1,500 mg a day.  Limit alcohol intake to no more than 1 drink a day for nonpregnant women and 2 drinks a day for men. One drink equals 12 oz of beer, 5 oz of wine, or 1 oz of hard liquor.  Work with your health care provider to maintain a healthy body weight or to lose weight. Ask what an ideal weight is for you.  Get at least 30 minutes of exercise that causes your heart to beat faster (aerobic exercise) most days of the week. Activities may include walking, swimming, or biking.  Work with your health care provider or diet and nutrition specialist (dietitian) to adjust your eating plan to your individual calorie needs. Reading food labels  Check food labels for the amount of sodium per serving. Choose foods with less than 5 percent of the Daily Value of sodium. Generally, foods with less than 300 mg of sodium per serving fit into this eating plan.  To find whole grains, look for the word "whole" as the first word in the ingredient list. Shopping  Buy products labeled as "low-sodium" or "no salt added."  Buy fresh foods. Avoid canned foods and  premade or frozen meals. Cooking  Avoid adding salt when cooking. Use salt-free seasonings or herbs instead of table salt or sea salt. Check with your health care provider or pharmacist before using salt substitutes.  Do not fry foods. Cook foods using healthy methods such as baking, boiling, grilling, and broiling instead.  Cook with heart-healthy oils, such as olive, canola, soybean, or sunflower oil. Meal planning   Eat a balanced diet that includes: ? 5 or more servings of fruits and vegetables each day. At each meal, try to fill half of your plate with fruits and vegetables. ? Up to 6-8 servings of whole grains each day. ? Less than 6 oz of lean meat, poultry, or fish each day. A 3-oz serving of meat is about the same size as a deck of cards. One egg equals 1 oz. ? 2 servings of low-fat dairy each day. ? A serving of nuts, seeds, or beans 5 times each week. ? Heart-healthy fats. Healthy fats called Omega-3 fatty acids are found in foods such as flaxseeds and coldwater fish, like sardines, salmon, and mackerel.  Limit how much you eat of the following: ? Canned or prepackaged foods. ? Food that is high in trans fat, such as fried foods. ? Food that is high in saturated fat, such as fatty meat. ? Sweets, desserts, sugary drinks, and other foods with added sugar. ? Full-fat dairy products.  Do not salt foods before eating.  Try to eat at least 2 vegetarian meals each week.  Eat more home-cooked food and less restaurant, buffet, and fast food.  When eating at a restaurant, ask that your food be prepared with less salt or no salt, if possible. What foods are recommended? The items listed may not be a complete list. Talk with your dietitian about what dietary choices are best for you. Grains Whole-grain or whole-wheat bread. Whole-grain or whole-wheat pasta. Brown rice. [name]. Bulgur. Whole-grain and low-sodium cereals. Pita bread. Low-fat, low-sodium crackers.  Whole-wheat flour tortillas. Vegetables Fresh or frozen vegetables (raw, steamed, roasted, or grilled). Low-sodium or reduced-sodium tomato and vegetable juice. Low-sodium or reduced-sodium tomato sauce and tomato paste. Low-sodium or reduced-sodium canned vegetables. Fruits All fresh, dried, or frozen fruit. Canned fruit in natural juice (without added sugar). Meat and other protein foods Skinless chicken or [place]. Ground chicken or [place]. Pork with fat trimmed off. Fish and seafood. Egg whites. Dried beans, peas, or lentils. Unsalted nuts, nut butters, and seeds. Unsalted canned beans. Lean cuts of beef with fat trimmed off. Low-sodium, lean deli meat. Dairy Low-fat (1%) or fat-free (skim) milk. Fat-free, low-fat, or reduced-fat cheeses. Nonfat, low-sodium ricotta or cottage cheese. Low-fat or nonfat yogurt. Low-fat, low-sodium cheese. Fats and oils Soft margarine without trans fats. Vegetable oil. Low-fat, reduced-fat, or light mayonnaise and salad dressings (reduced-sodium). Canola, safflower, olive, soybean, and sunflower oils. Avocado. Seasoning and other foods Herbs. Spices. Seasoning mixes without salt. Unsalted popcorn and pretzels. Fat-free sweets. What foods are not recommended? The items listed may not be a complete list. Talk with your dietitian about what dietary choices are best for you. Grains Baked goods made with fat, such as croissants, muffins, or some breads. Dry pasta or rice meal packs. Vegetables Creamed or fried vegetables. Vegetables in a cheese sauce. Regular canned vegetables (not low-sodium or reduced-sodium). Regular canned tomato sauce and paste (not low-sodium or reduced-sodium). Regular tomato and vegetable juice (not low-sodium or reduced-sodium). [name]. Olives. Fruits Canned fruit in a light or heavy syrup. Fried fruit. Fruit in cream or butter sauce. Meat and other protein foods Fatty cuts of meat. Ribs. Fried meat. [name]. Sausage. Bologna and other  processed lunch meats. Salami. Fatback. Hotdogs. Bratwurst. Salted nuts and seeds. Canned beans with added salt. Canned or smoked fish. Whole eggs or egg yolks. Chicken or [place] with skin. Dairy Whole or 2% milk, cream, and half-and-half. Whole or full-fat cream cheese. Whole-fat or sweetened yogurt. Full-fat cheese. Nondairy creamers. Whipped toppings. Processed cheese and cheese spreads. Fats and oils Butter. Stick margarine. Lard. Shortening. Ghee. Bacon fat. Tropical oils, such as coconut, palm kernel, or palm oil. Seasoning and other foods Salted popcorn and pretzels. Onion salt, garlic salt, seasoned salt, table salt, and sea salt. Worcestershire sauce. Tartar sauce. Barbecue sauce. Teriyaki sauce. Soy sauce, including reduced-sodium. Steak sauce. Canned and packaged gravies. Fish sauce. Oyster sauce. Cocktail sauce. Horseradish that you find on the shelf. Ketchup. Mustard. Meat flavorings and tenderizers. Bouillon cubes. Hot sauce and Tabasco sauce. Premade or packaged marinades. Premade or packaged taco seasonings. Relishes. Regular salad dressings. Where to find more information:  National Heart, Lung, and [place]: https://wilson-eaton.com/  American Heart Association: www.heart.org Summary  The DASH eating plan is a healthy eating plan that has been shown to reduce high blood pressure (hypertension). It may also reduce your risk for type 2 diabetes, heart disease, and stroke.  With the DASH eating plan, you should limit salt (sodium)  intake to 2,300 mg a day. If you have hypertension, you may need to reduce your sodium intake to 1,500 mg a day.  When on the DASH eating plan, aim to eat more fresh fruits and vegetables, whole grains, lean proteins, low-fat dairy, and heart-healthy fats.  Work with your health care provider or diet and nutrition specialist (dietitian) to adjust your eating plan to your individual calorie needs. This information is not intended to replace advice given to  you by your health care provider. Make sure you discuss any questions you have with your health care provider. Document Released: 12/11/2010 Document Revised: 12/16/2015 Document Reviewed: 12/16/2015 Elsevier Interactive Patient Education  2017 [employer].

## 2016-06-12 NOTE — Progress Notes (Signed)
Patient ID: [name], female    DOB: 06-11-1970  MRN: [id number]  CC: Establish Care   Subjective: [name] is a 46 y.o. female who presents for physical.  Saw PA here on the 18th of last month. Her concerns today include:  1.  Here for pap but currently on menses -Tubal ligation 14 yrs ago -no menses x 5 mths, then started having heavy menses last night.  Prior to 5 mths ago, menses were every 6-8 wks.  Menses were always irregular from teenage years.   -no hot flashes, mood swings, vag. Dryness. -no abn pap. Last pap was 6-7 yrs ago.  -no vaginal discgh or itching -Sexually active with her husband only  Lump in LT breast 1 month -little sore -fhx of BR CA in [place] at age 41+ -last MMG was 14 yrs ago -Referral given for mammogram on last visit 2 weeks ago but patient states she has not been called as yet for this  2.  BP elevated on last visit.   -Reports she was not told to check blood pressures and bring in readings today -hx of HTN during last 2 pregnancies but nl afterwards.  -BS  Also run high in pregnancy -goes to gym 3-4 days a wk; just started going -retaining more fluid in legs x few wks  3.  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 Trying to eat more of a high protein diet, has cut back on fatty foods  Current Outpatient Prescriptions on File Prior to Visit  Medication Sig Dispense Refill  . acetaminophen (TYLENOL) 500 MG tablet Take 1,000 mg by mouth every 6 (six) hours as needed. For fever/pain    . fluticasone (FLONASE) 50 MCG/ACT nasal spray Place 2 sprays into the nose daily. 16 g 0   No current facility-administered medications on file prior to visit.     No Known Allergies  Social History   Social History  . Marital status: Married    Spouse name: N/A  . Number of children: N/A  . Years of education: N/A   Occupational History  . Not on file.   Social History Main Topics  . Smoking status:  Former Research scientist (life sciences)  . Smokeless tobacco: Not on file  . Alcohol use No  . Drug use: No  . Sexual activity: Not on file   Other Topics Concern  . Not on file   Social History Narrative  . No narrative on file    Family History  Problem Relation Age of Onset  . Diabetes Mother   . Hypertension Mother   . Arthritis Mother   . Colon cancer Father   . Diabetes Father   . Heart disease Father   . Hypertension Father   . Breast cancer Maternal Grandfather     Past Surgical History:  Procedure Laterality Date  . ANKLE SURGERY    . TUBAL LIGATION      ROS: Review of Systems  Constitutional: Positive for activity change (Just joined a gym and is working with a Clinical research associate) and appetite change (eating less).  HENT: Negative for congestion, hearing loss and sinus pain.   Eyes: Negative for visual disturbance.  Respiratory: Negative for cough and shortness of breath.   Cardiovascular: Positive for leg swelling. Negative for chest pain and palpitations.  Gastrointestinal: Negative for abdominal pain, blood in stool and constipation.       Has an umbilical hernia that has not changed in size  and is not painful.  Genitourinary: Negative for dyspareunia and dysuria.  Musculoskeletal: Negative for arthralgias.  Neurological: Negative for dizziness and headaches.  Psychiatric/Behavioral: Negative for dysphoric mood. The patient is not nervous/anxious.     PHYSICAL EXAM: BP 132/88 (BP Location: Right Arm, Patient Position: Sitting, Cuff Size: Large)   Pulse 60   Temp 98 F (36.7 C) (Oral)   Resp 12   Ht 5\' 7"  (1.702 m)   Wt 260 lb 6.4 oz (118.1 kg)   LMP 06/11/2016 (Exact Date)   SpO2 98%   BMI 40.78 kg/m   BP 142/90 on repeat Wt Readings from Last 3 Encounters:  06/12/16 260 lb 6.4 oz (118.1 kg)  05/22/16 256 lb 12.8 oz (116.5 kg)  05/16/16 259 lb (117.5 kg)    Physical Exam General appearance - alert, well appearing, and in no distress Mental status - alert, oriented to  person, place, and time, normal mood, behavior, speech, dress, motor activity, and thought processes Eyes - pupils equal and reactive, extraocular eye movements intact Ears - bilateral TM's and external ear canals normal Nose - normal and patent, no erythema, discharge or polyps Mouth - mucous membranes moist, pharynx normal without lesions Neck - supple, no thyroid enlargement Lymphatics -no cervical or axillary lymphadenopathy Chest - clear to auscultation, no wheezes, rales or rhonchi, symmetric air entry Heart - normal rate, regular rhythm, normal S1, S2, no murmurs, rubs, clicks or gallops Abdomen - soft, nontender, nondistended, no masses or organomegaly.  Small reducible umbilical hernia Breasts - firm 3 cm mass felt in the left breast lateral to the nipple. Pelvic - deferred as patient currently has vaginal bleeding Neurological - cranial nerves II through XII intact, motor and sensory grossly normal bilaterally Extremities - 1+ lower extremity edema. Good peripheral pulses   Results for orders placed or performed in visit on 06/12/16  HgB A1c  Result Value Ref Range   Hemoglobin A1C 5.2     ASSESSMENT AND PLAN: ,1. Annual visit for general adult medical examination with abnormal findings -Commended her in efforts to change eating habits and start of her exercise program. Encouraged her to continue. -Counseled extensively on healthy eating habits   2. Left breast mass -I had my nurse called today and schedule this mammogram for her. Further management will be based on results  3. Screening for diabetes mellitus Diabetes screen negative today - HgB A1c  4. Essential hypertension -Started HCTZ which will help with blood pressure and lower extremity edema. -DASH diet discussed -Advised to check blood pressure a few times a week and bring in readings on next visit - hydrochlorothiazide (HYDRODIURIL) 25 MG tablet; Take 0.5 tablets (12.5 mg total) by mouth daily.  Dispense: 45  tablet; Refill: 3 - Lipid panel; Future - Comprehensive metabolic panel; Future  5. Irregular menses -Patient likely perimenopausal. Will check, on levels today. Asked to keep a calendar of bleeding including the length and whether bleeding is heavy or light -Pap smear deferred to next visit due to bleeding - Follicle stimulating hormone; Future - Luteinizing hormone; Future - Prolactin; Future  6. Class 3 severe obesity due to excess calories without serious comorbidity with body mass index (BMI) of 40.0 to 44.9 in adult [facility]) -See discussion above  7. Colon cancer screening -Referred for colon cancer screening given that the age has now changed to 59 and she has family history of colon cancer - Ambulatory referral to Gastroenterology  8. Low vitamin D level Level was 28 on  last visit. Advised to take vitamin D 400 international units daily this can be purchased over-the-counter   Patient was given the opportunity to ask questions.  Patient verbalized understanding of the plan and was able to repeat key elements of the plan.   Orders Placed This Encounter  Procedures  . Lipid panel  . Comprehensive metabolic panel  . Follicle stimulating hormone  . Luteinizing hormone  . Prolactin  . Ambulatory referral to Gastroenterology  . HgB A1c     Requested Prescriptions   Signed Prescriptions Disp Refills  . hydrochlorothiazide (HYDRODIURIL) 25 MG tablet 45 tablet 3    Sig: Take 0.5 tablets (12.5 mg total) by mouth daily.    Return in about 4 weeks (around 07/10/2016) for PAP and recheck BP .  [name], MD, FACP

## 2016-06-18 ENCOUNTER — Encounter (HOSPITAL_COMMUNITY): Payer: Self-pay

## 2016-06-18 ENCOUNTER — Ambulatory Visit
Admission: RE | Admit: 2016-06-18 | Discharge: 2016-06-18 | Disposition: A | Discharge status: Home / Self Care | Payer: No Typology Code available for payment source | Source: Ambulatory Visit | Attending: Obstetrics and Gynecology | Admitting: Obstetrics and Gynecology

## 2016-06-18 ENCOUNTER — Ambulatory Visit (HOSPITAL_COMMUNITY)
Admission: RE | Admit: 2016-06-18 | Discharge: 2016-06-18 | Disposition: A | Discharge status: Home / Self Care | Payer: Medicaid Other | Source: Ambulatory Visit | Attending: Obstetrics and Gynecology | Admitting: Obstetrics and Gynecology

## 2016-06-18 ENCOUNTER — Ambulatory Visit
Admission: RE | Admit: 2016-06-18 | Discharge: 2016-06-18 | Disposition: A | Discharge status: Home / Self Care | Payer: No Typology Code available for payment source | Source: Ambulatory Visit | Attending: Physician Assistant | Admitting: Physician Assistant

## 2016-06-18 ENCOUNTER — Other Ambulatory Visit (HOSPITAL_COMMUNITY): Payer: Self-pay | Admitting: Obstetrics and Gynecology

## 2016-06-18 VITALS — BP 146/100 | Ht 67.5 in | Wt 248.6 lb

## 2016-06-18 DIAGNOSIS — R599 Enlarged lymph nodes, unspecified: Secondary | ICD-10-CM

## 2016-06-18 DIAGNOSIS — N6310 Unspecified lump in the right breast, unspecified quadrant: Secondary | ICD-10-CM

## 2016-06-18 DIAGNOSIS — N6320 Unspecified lump in the left breast, unspecified quadrant: Secondary | ICD-10-CM

## 2016-06-18 DIAGNOSIS — Z01419 Encounter for gynecological examination (general) (routine) without abnormal findings: Secondary | ICD-10-CM

## 2016-06-18 NOTE — Progress Notes (Signed)
Complaints of left breast lump x 5 weeks.  Pap Smear: Pap smear completed today. Last Pap smear was 10 years ago and normal per patient. Per patient has no history of an abnormal Pap smear. No Pap smear results are in EPIC.  Physical exam: Breasts Breasts symmetrical. No skin abnormalities bilateral breasts. No nipple retraction bilateral breasts. No nipple discharge bilateral breasts. No lymphadenopathy. No lumps palpated right breast. Palpated a lump within the left breast between 2-3 o'clock 1 cm from the nipple. Complaints of tenderness when palpated lump. Referred patient to the [facility] for a diagnostic mammogram and possible left breast ultrasound. Appointment scheduled for Thursday, June 18, 2016 at 1520.  Pelvic/Bimanual   Ext Genitalia No lesions, no swelling and no discharge observed on external genitalia.         Vagina Vagina pink and normal texture. No lesions or discharge observed in vagina.          Cervix Cervix is present. Cervix pink and of normal texture. No discharge observed.     Uterus Uterus is present and palpable. Uterus in normal position and normal size.        Adnexae Bilateral ovaries present and palpable. No tenderness on palpation.          Rectovaginal No rectal exam completed today since patient had no rectal complaints. No skin abnormalities observed on exam.    Smoking History: Patient has never smoked.  Patient Navigation: Patient education provided. Access to services provided for patient through [facility] program.

## 2016-06-18 NOTE — Patient Instructions (Signed)
Explained breast self awareness with [name]. Let patient know [facility] will cover Pap smears and HPV typing every 5 years unless has a history of abnormal Pap smears. Referred patient to the [facility] for a diagnostic mammogram and possible left breast ultrasound. Appointment scheduled for Thursday, June 18, 2016 at 1520. Let patient know will follow up with her within the next couple weeks with results with results of Pap smear by phone. [name] verbalized understanding.  [name], Arvil Chaco, RN 3:37 PM

## 2016-06-19 ENCOUNTER — Ambulatory Visit
Admission: RE | Admit: 2016-06-19 | Discharge: 2016-06-19 | Disposition: A | Discharge status: Home / Self Care | Payer: No Typology Code available for payment source | Source: Ambulatory Visit | Attending: Obstetrics and Gynecology | Admitting: Obstetrics and Gynecology

## 2016-06-19 ENCOUNTER — Ambulatory Visit: Payer: Medicaid Other | Attending: Internal Medicine

## 2016-06-19 ENCOUNTER — Encounter (HOSPITAL_COMMUNITY): Payer: Self-pay | Admitting: *Deleted

## 2016-06-19 ENCOUNTER — Ambulatory Visit: Payer: Self-pay

## 2016-06-19 DIAGNOSIS — R599 Enlarged lymph nodes, unspecified: Secondary | ICD-10-CM

## 2016-06-19 DIAGNOSIS — N6320 Unspecified lump in the left breast, unspecified quadrant: Secondary | ICD-10-CM

## 2016-06-19 DIAGNOSIS — N926 Irregular menstruation, unspecified: Secondary | ICD-10-CM | POA: Diagnosis not present

## 2016-06-19 DIAGNOSIS — I10 Essential (primary) hypertension: Secondary | ICD-10-CM

## 2016-06-19 NOTE — Progress Notes (Signed)
Patient here for lab visit only 

## 2016-06-20 LAB — FOLLICLE STIMULATING HORMONE: FSH: 10.9 m[IU]/mL

## 2016-06-20 LAB — COMPREHENSIVE METABOLIC PANEL
ALT: 14 IU/L (ref 0–32)
AST: 18 IU/L (ref 0–40)
Albumin/Globulin Ratio: 1.5 (ref 1.2–2.2)
Albumin: 4.5 g/dL (ref 3.5–5.5)
Alkaline Phosphatase: 101 IU/L (ref 39–117)
BUN/Creatinine Ratio: 21 (ref 9–23)
BUN: 15 mg/dL (ref 6–24)
Bilirubin Total: 0.9 mg/dL (ref 0.0–1.2)
CALCIUM: 9.6 mg/dL (ref 8.7–10.2)
CO2: 26 mmol/L (ref 20–29)
Chloride: 98 mmol/L (ref 96–106)
Creatinine, Ser: 0.72 mg/dL (ref 0.57–1.00)
GFR, EST AFRICAN AMERICAN: 116 mL/min/{1.73_m2} (ref >59)
GFR, EST NON AFRICAN AMERICAN: 101 mL/min/{1.73_m2} (ref >59)
GLUCOSE: 116 mg/dL — AB (ref 65–99)
Globulin, Total: 3.1 g/dL (ref 1.5–4.5)
Potassium: 3.6 mmol/L (ref 3.5–5.2)
Sodium: 141 mmol/L (ref 134–144)
TOTAL PROTEIN: 7.6 g/dL (ref 6.0–8.5)

## 2016-06-20 LAB — LIPID PANEL
CHOL/HDL RATIO: 3.2 ratio (ref 0.0–4.4)
Cholesterol, Total: 200 mg/dL — ABNORMAL HIGH (ref 100–199)
HDL: 62 mg/dL (ref >39)
LDL Calculated: 119 mg/dL — ABNORMAL HIGH (ref 0–99)
Triglycerides: 96 mg/dL (ref 0–149)
VLDL Cholesterol Cal: 19 mg/dL (ref 5–40)

## 2016-06-20 LAB — LUTEINIZING HORMONE: LH: 8.7 m[IU]/mL

## 2016-06-20 LAB — PROLACTIN: PROLACTIN: 23.8 ng/mL — AB (ref 4.8–23.3)

## 2016-06-22 ENCOUNTER — Other Ambulatory Visit: Payer: Self-pay | Admitting: Internal Medicine

## 2016-06-22 ENCOUNTER — Encounter: Payer: Self-pay | Admitting: Gastroenterology

## 2016-06-22 LAB — CYTOLOGY - PAP
DIAGNOSIS: NEGATIVE
HPV: NOT DETECTED

## 2016-06-22 NOTE — Telephone Encounter (Signed)
Pt calling to advise that her breast biopsy came back positive for breast cancer and due to that she is not sleeping. Requests a mild sleep aid. Also states that her bp has been elevated, though she continues to take her meds. Please f/u with pt.

## 2016-06-22 NOTE — Telephone Encounter (Signed)
I attempted to call pt on home phone this evening. No answer. Called mobile and got her voicemail.  I did not leave a message.  Will try again tomorrow.

## 2016-06-22 NOTE — Telephone Encounter (Signed)
Will defer to Dr. Wynetta Emery

## 2016-06-23 ENCOUNTER — Encounter: Payer: Self-pay | Admitting: *Deleted

## 2016-06-23 ENCOUNTER — Telehealth: Payer: Self-pay | Admitting: *Deleted

## 2016-06-23 DIAGNOSIS — Z171 Estrogen receptor negative status [ER-]: Secondary | ICD-10-CM

## 2016-06-23 DIAGNOSIS — C50412 Malignant neoplasm of upper-outer quadrant of left female breast: Secondary | ICD-10-CM | POA: Insufficient documentation

## 2016-06-23 HISTORY — DX: Estrogen receptor negative status (ER-): Z17.1

## 2016-06-23 HISTORY — DX: Malignant neoplasm of upper-outer quadrant of left female breast: C50.412

## 2016-06-23 MED ORDER — TRAZODONE HCL 50 MG PO TABS: 25.0000 mg | ORAL_TABLET | Freq: Every evening | ORAL | 3 refills | Status: DC | PRN

## 2016-06-23 MED FILL — ?TRAZODONE 50 MG TABLET: 50 | 30 days supply | Qty: 30 | Fill #0 | Status: TO

## 2016-06-23 NOTE — Addendum Note (Signed)
Addended by: [name] B on: 06/23/2016 09:00 AM   Modules accepted: Orders

## 2016-06-23 NOTE — Telephone Encounter (Signed)
Spoke with pt this a.m. 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 Patient also reports having had a Pap smear done the same day as her breast biopsy. I have reviewed the Pap smear report that was negative for any cancer cells. Patient reports that her diastolic blood pressure has been running high despite using the HCTZ. She feels this is related to stress of which she is currently going through. Yesterday was her lowest reading of 138/92. She is not sleeping well since this diagnosis and requested something to help her rest at night and get her mind off of things. We discuss putting her on a low dose of trazodone. I will give her the 50 mg tablets she'll take a 1/2-1 tab QHS PRN. She will continue to monitor blood pressure. If pressures still remain high despite getting adequate sleep then we will add a low dose of lisinopril. Patient was agreeable to this plan.

## 2016-06-23 NOTE — Telephone Encounter (Signed)
Confirmed [facility] for 6.27.18 at 1215 .  Instructions and contact information given.

## 2016-06-24 ENCOUNTER — Telehealth: Payer: Self-pay

## 2016-06-24 NOTE — Telephone Encounter (Signed)
Contacted pt to go over lab results pt didn't answer and was unable to lvm  If pt calls back please give results: LDL cholesterol is mildly elevated at 119 with normal being less than 100. Healthy eating and regular exercise will help to lower cholesterol. Hormone levels were normal. Not in perimenopausal range as yet. Kidney and liver function tests were normal.

## 2016-06-26 ENCOUNTER — Ambulatory Visit: Payer: No Typology Code available for payment source | Admitting: Nurse Practitioner

## 2016-06-28 ENCOUNTER — Encounter: Payer: Self-pay | Admitting: General Surgery

## 2016-06-29 ENCOUNTER — Encounter (HOSPITAL_COMMUNITY): Payer: Self-pay | Admitting: *Deleted

## 2016-06-29 NOTE — Progress Notes (Signed)
Letter mailed to patient with negative pap smear results. Next pap smear due in five years.  

## 2016-07-01 ENCOUNTER — Other Ambulatory Visit (HOSPITAL_BASED_OUTPATIENT_CLINIC_OR_DEPARTMENT_OTHER): Payer: Medicaid Other

## 2016-07-01 ENCOUNTER — Ambulatory Visit (HOSPITAL_BASED_OUTPATIENT_CLINIC_OR_DEPARTMENT_OTHER): Payer: Medicaid Other | Admitting: Oncology

## 2016-07-01 ENCOUNTER — Other Ambulatory Visit: Payer: Self-pay | Admitting: *Deleted

## 2016-07-01 ENCOUNTER — Ambulatory Visit
Admission: RE | Admit: 2016-07-01 | Discharge: 2016-07-01 | Disposition: A | Discharge status: Home / Self Care | Payer: Medicaid Other | Source: Ambulatory Visit | Attending: Radiation Oncology | Admitting: Radiation Oncology

## 2016-07-01 ENCOUNTER — Encounter: Payer: Self-pay | Admitting: Oncology

## 2016-07-01 ENCOUNTER — Ambulatory Visit: Payer: Medicaid Other | Attending: General Surgery | Admitting: Physical Therapy

## 2016-07-01 ENCOUNTER — Encounter: Payer: Self-pay | Admitting: Physical Therapy

## 2016-07-01 ENCOUNTER — Other Ambulatory Visit: Payer: Self-pay | Admitting: General Surgery

## 2016-07-01 VITALS — BP 133/73 | HR 69 | Temp 98.4°F | Resp 18 | Ht 67.5 in | Wt 255.2 lb

## 2016-07-01 DIAGNOSIS — Z171 Estrogen receptor negative status [ER-]: Secondary | ICD-10-CM | POA: Insufficient documentation

## 2016-07-01 DIAGNOSIS — Z170 Estrogen receptor positive status [ER+]: Secondary | ICD-10-CM

## 2016-07-01 DIAGNOSIS — R293 Abnormal posture: Secondary | ICD-10-CM | POA: Diagnosis present

## 2016-07-01 DIAGNOSIS — C50412 Malignant neoplasm of upper-outer quadrant of left female breast: Secondary | ICD-10-CM

## 2016-07-01 DIAGNOSIS — C773 Secondary and unspecified malignant neoplasm of axilla and upper limb lymph nodes: Secondary | ICD-10-CM

## 2016-07-01 LAB — CBC WITH DIFFERENTIAL/PLATELET
BASO%: 0.3 % (ref 0.0–2.0)
Basophils Absolute: 0 10*3/uL (ref 0.0–0.1)
EOS ABS: 0.1 10*3/uL (ref 0.0–0.5)
EOS%: 2 % (ref 0.0–7.0)
HCT: 37.3 % (ref 34.8–46.6)
HGB: 12.3 g/dL (ref 11.6–15.9)
LYMPH%: 23.1 % (ref 14.0–49.7)
MCH: 28.5 pg (ref 25.1–34.0)
MCHC: 33 g/dL (ref 31.5–36.0)
MCV: 86.5 fL (ref 79.5–101.0)
MONO#: 0.4 10*3/uL (ref 0.1–0.9)
MONO%: 7.1 % (ref 0.0–14.0)
NEUT#: 4 10*3/uL (ref 1.5–6.5)
NEUT%: 67.5 % (ref 38.4–76.8)
PLATELETS: 188 10*3/uL (ref 145–400)
RBC: 4.31 10*6/uL (ref 3.70–5.45)
RDW: 12.8 % (ref 11.2–14.5)
WBC: 5.9 10*3/uL (ref 3.9–10.3)
lymph#: 1.4 10*3/uL (ref 0.9–3.3)

## 2016-07-01 LAB — COMPREHENSIVE METABOLIC PANEL
ALT: 14 U/L (ref 0–55)
ANION GAP: 8 meq/L (ref 3–11)
AST: 15 U/L (ref 5–34)
Albumin: 3.4 g/dL — ABNORMAL LOW (ref 3.5–5.0)
Alkaline Phosphatase: 85 U/L (ref 40–150)
BUN: 8.8 mg/dL (ref 7.0–26.0)
CHLORIDE: 105 meq/L (ref 98–109)
CO2: 27 meq/L (ref 22–29)
Calcium: 9.1 mg/dL (ref 8.4–10.4)
Creatinine: 0.8 mg/dL (ref 0.6–1.1)
EGFR: 89 mL/min/{1.73_m2} — AB (ref >90)
Glucose: 104 mg/dl (ref 70–140)
POTASSIUM: 3.8 meq/L (ref 3.5–5.1)
Sodium: 140 mEq/L (ref 136–145)
Total Bilirubin: 0.64 mg/dL (ref 0.20–1.20)
Total Protein: 7 g/dL (ref 6.4–8.3)

## 2016-07-01 NOTE — Therapy (Signed)
[place], [place], [address] Phone: [phone]   Fax:  [phone]  Physical Therapy Evaluation  Patient Details  Name: [name] MRN: [id number] Date of Birth: [birth year] Referring Provider: Dr. [name]  Encounter Date: 07/01/2016      PT End of Session - 07/01/16 1629    Visit Number 1   Number of Visits 1   PT Start Time 1694   PT Stop Time 1602   PT Time Calculation (min) 32 min   Activity Tolerance Patient tolerated treatment well   Behavior During Therapy [facility] for tasks assessed/performed      Past Medical History:  Diagnosis Date  . Malignant neoplasm of upper-outer quadrant of left breast in female, estrogen receptor negative ([place]) 06/23/2016    Past Surgical History:  Procedure Laterality Date  . ANKLE SURGERY    . TUBAL LIGATION      There were no vitals filed for this visit.       Subjective Assessment - 07/01/16 1622    Subjective Patient reports she is here today to be seen by her medical team for her newly diagnosed left breast cancer.   Patient is accompained by: Family member   Pertinent History Patient was diagnosed on 06/18/16 with left triple negative breast cancer. It is located in the upper outer quadrant and measures 2.5 cm. 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 She has known positive axillary lymph nodes and is stage 3. She has no other known medical problems.   Patient Stated Goals reduce lymphedema risk and learn post op shoulder ROM HEP   Currently in Pain? Yes   Pain Score 8    Pain Location Back   Pain Orientation Lower;Right;Left   Pain Descriptors / Indicators Aching  Deep   Pain Type Acute pain   Pain Onset 1 to 4 weeks ago   Pain Frequency Intermittent   Aggravating Factors  Wakes her at night   Pain Relieving Factors unknown   Multiple Pain Sites No            [facility] PT Assessment - 07/01/16 0001      Assessment   Medical Diagnosis Left breast cancer   Referring Provider Dr. [name]   Onset Date/Surgical Date 06/18/16   Hand Dominance Right   Prior Therapy none     Precautions   Precautions Other (comment)   Precaution Comments active cancer     Restrictions   Weight Bearing Restrictions No     Balance Screen   Has the patient fallen in the past 6 months No   Has the patient had a decrease in activity level because of a fear of falling?  No   Is the patient reluctant to leave their home because of a fear of falling?  No     Home Social worker Private residence   Living Arrangements Spouse/significant other;Children;Parent  Husband, 70 and 64 y.o. 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   Leisure She goes to [employer] 3-4 x/wk  and does 30 min cardio     Cognition   Overall Cognitive Status Within Functional Limits for tasks assessed     Posture/Postural Control   Posture/Postural Control Postural limitations   Postural Limitations  Forward head;Rounded Shoulders     ROM / Strength   AROM / PROM / Strength AROM;Strength     AROM   AROM Assessment Site Shoulder;Cervical   Right/Left Shoulder Right;Left   Right Shoulder Extension 50 Degrees   Right Shoulder Flexion 162 Degrees   Right Shoulder ABduction 170 Degrees   Right Shoulder Internal Rotation 78 Degrees   Right Shoulder External Rotation 83 Degrees   Left Shoulder Extension 52 Degrees   Left Shoulder Flexion 162 Degrees   Left Shoulder ABduction 169 Degrees   Left Shoulder Internal Rotation 64 Degrees   Left Shoulder External Rotation 85 Degrees   Cervical Flexion WNL   Cervical Extension WNL   Cervical - Right Side Bend WNL   Cervical - Left Side Bend WNL   Cervical - Right Rotation WNL   Cervical - Left  Rotation WNL     Strength   Overall Strength Within functional limits for tasks performed           LYMPHEDEMA/ONCOLOGY QUESTIONNAIRE - 07/01/16 1628      Type   Cancer Type Left breast cancer     Lymphedema Assessments   Lymphedema Assessments Upper extremities     Right Upper Extremity Lymphedema   10 cm Proximal to Olecranon Process 38.5 cm   Olecranon Process 29.7 cm   10 cm Proximal to Ulnar Styloid Process 26.7 cm   Just Proximal to Ulnar Styloid Process 17.9 cm   Across Hand at [employer] 19.7 cm   At [place] of 2nd Digit 6.1 cm     Left Upper Extremity Lymphedema   10 cm Proximal to Olecranon Process 38.5 cm   Olecranon Process 28.8 cm   10 cm Proximal to Ulnar Styloid Process 26.3 cm   Just Proximal to Ulnar Styloid Process 17.4 cm   Across Hand at [employer] 19.3 cm   At [place] of 2nd Digit 6.2 cm         Objective measurements completed on examination: See above findings.            Patient was instructed today in a home exercise program today for post op shoulder range of motion. These included active assist shoulder flexion in sitting, scapular retraction, wall walking with shoulder abduction, and hands behind head external rotation.  She was encouraged to do these twice a day, holding 3 seconds and repeating 5 times when permitted by her physician.          PT Education - 07/01/16 1629    Education provided Yes   Education Details Lymphedema risk reduction and post op shoulder ROM HEP   Person(s) Educated Patient;Spouse   Methods Explanation;Demonstration;Handout   Comprehension Returned demonstration;Verbalized understanding              Breast Clinic Goals - 07/01/16 1632      Patient will be able to verbalize understanding of pertinent lymphedema risk reduction practices relevant to her diagnosis specifically related to skin care.   Time 1   Period Days   Status Achieved     Patient will be able to return demonstrate  and/or verbalize understanding of the post-op home exercise program related to regaining shoulder range of motion.   Time 1   Period Days   Status Achieved     Patient will be able to verbalize understanding of the importance of attending the postoperative After Breast Cancer Class for further lymphedema risk reduction education and therapeutic exercise.   Time 1  Period Days   Status Achieved               Plan - 07/01/16 1629    Clinical Impression Statement Patient was diagnosed on 06/18/16 with left triple negative breast cancer. It is located in the upper outer quadrant and measures 2.5 cm. 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 She has known positive axillary lymph nodes and is stage 3. She has no other known medical problems. Her multidisciplinary medical team met prior to her assessments to determine a recommended treatment plan. She is planning to have neoadjuvant chemotherapy, a left mastectomy and targeted axillary node dissection, and radiation. She will benefit from post op PT to regain shoulder ROM and reduce lymphedema risk as her risk will be high due to a node dissection, radiated axillary nodes, and a BMI greater than 30,.   History and Personal Factors relevant to plan of care: None   Clinical Presentation Stable   Clinical Presentation due to: Stable condition   Clinical Decision Making Low   Rehab Potential Excellent   Clinical Impairments Affecting Rehab Potential none except possible extent of disease    PT Frequency One time visit   PT Treatment/Interventions Therapeutic exercise;Patient/family education   PT Next Visit Plan Will f/u after surgery to determine PT needs   PT Home Exercise Plan Post op shoulder ROM HEP   Consulted and Agree with Plan of Care Patient;Family member/caregiver   Family Member Consulted Husband      Patient will benefit from skilled therapeutic intervention in order to improve the following deficits and  impairments:  Decreased range of motion, Impaired UE functional use, Pain, Decreased knowledge of precautions, Postural dysfunction  Visit Diagnosis: Carcinoma of upper-outer quadrant of left breast in female, estrogen receptor negative ([place]) - Plan: PT plan of care cert/re-cert  Abnormal posture - Plan: PT plan of care cert/re-cert   Patient will follow up at outpatient cancer rehab if needed following surgery.  If the patient requires physical therapy at that time, a specific plan will be dictated and sent to the referring physician for approval. The patient was educated today on appropriate basic range of motion exercises to begin post operatively and the importance of attending the After Breast Cancer class following surgery.  Patient was educated today on lymphedema risk reduction practices as it pertains to recommendations that will benefit the patient immediately following surgery.  She verbalized good understanding.  No additional physical therapy is indicated at this time.      Problem List Patient Active Problem List   Diagnosis Date Noted  . Malignant neoplasm of upper-outer quadrant of left breast in female, estrogen receptor negative ([place]) 06/23/2016  . Left breast mass 06/12/2016  . Essential hypertension 06/12/2016  . Class 3 severe obesity due to excess calories without serious comorbidity with body mass index (BMI) of 40.0 to 44.9 in adult ([place]) 06/12/2016  . Low vitamin D level 06/12/2016  . Irregular menses 06/12/2016   Annia Friendly, PT 07/01/16 4:34 PM   [place], [place], [address] Phone: [phone]   Fax:  [phone]  Name: [name] MRN: [id number] Date of Birth: May 03, 1970

## 2016-07-01 NOTE — Progress Notes (Signed)
[facility] Psychosocial Distress Screening Spiritual Care  Counselor met with patient and her husband in [facility] to introduce [facility] team/resources, reviewing distress screen per protocol.  The patient scored a 10 on the Psychosocial Distress Thermometer which indicates Severe distress. Also assessed for distress and other psychosocial needs.   Counselor reviewed distress screening with [name]. [name] stated that while she had originally reported her distress at a 5/10, it had increased to a 10/10 following her meetings with her treatment team. Patient reported that she was not expecting her diagnosis to be so severe. Patient shared that she is very concerned for her two high-school aged daughters. Patient's father died suddenly from colon cancer in March 2017, leaving her with painful memories associated with cancer.   Patient is the caretaker of her mother and a nurturing figure to her family; patient reported not being able to sleep well because "I have to stay strong during the day because everyone is depending on me, but then at night I have all that time to think about everything that I haven't been able to all day." Patient reports feeling confident in her ability to fight cancer, but disoriented and overwhelmed by the diagnosis.  Follow up needed: Yes. Counselor will f/u with client as she seemed interested in individual counseling; [name] stated that she would like to be contacted by an LCSW to help her connect with resources around Medicare/financial strain.   ON[facility] DISTRESS SCREENING 07/01/2016  Screening Type Initial Screening  Distress experienced in past week (1-10) 10  Practical problem type Insurance  Family Problem type Children  Emotional problem type Adjusting to illness  Physical Problem type Sleep/insomnia  Referral to clinical social work Yes  Referral to support programs Yes   [place] Pam, [place] [facility] [place]

## 2016-07-01 NOTE — Progress Notes (Signed)
Radiation Oncology         [phone] ________________________________  Initial outpatient Consultation  Name: [name] MRN: [id number]  Date: 07/01/2016  DOB: 06/05/1970  [id number], Dalbert Batman, MD  [name], MD   REFERRING PHYSICIAN: [name], MD  DIAGNOSIS:    ICD-10-CM   1. 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 female who initially presented to the ED with a palpable left breast mass.  There was a large area of density on mammogram measuring 10.3 cm. On [place] the mass measured 2.5 cm at the 2 o'clock position. 2 lymph nodes in the left axilla were abnormal. The largest lymph node was biopsied. Biopsy of the left breast mass at the 2 o'clock position revealed grade III invasive ductal carcinoma, triple negative. She also demonstrated triple negative carcinoma in the left axillary tissue upon biopsy.  The patient presents to the clinic today to discuss the role that radiation therapy may play in the treatment of her disease. She is accompanied by her husband today.  On review of systems, the patient is positive for night sweats, fatigue, loss of sleep, glasses, top dentures, and feet swelling. She reports an umbilical hernia from [id number] without complications, mild pain from biopsies, breast lump, as well as back and joint pain. She reports anxiety.  She reports irregular  periods; she is still menstruating. She denies smoking and denies alcohol consumption.   Of note, the patient has a family history of breast cancer in her grandmother. Her father passed away recently of colon cancer.  PREVIOUS RADIATION THERAPY: No  PAST MEDICAL HISTORY:  has a past medical history of Malignant neoplasm of upper-outer quadrant of left breast in female, estrogen receptor negative ([place]) (06/23/2016).    PAST SURGICAL HISTORY: Past Surgical History:  Procedure Laterality Date  . ANKLE SURGERY    . TUBAL LIGATION      FAMILY HISTORY: family history includes Arthritis in her mother; Breast cancer in her maternal aunt and maternal grandmother; Cervical cancer in her maternal aunt; Colon cancer in her father; Diabetes in her father and mother; Heart disease in her father; Hypertension in her father and mother; Lung cancer in her maternal aunt.  SOCIAL HISTORY:  reports that she has never smoked. She has never used smokeless tobacco. She reports that she does not drink alcohol or use drugs. 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  ALLERGIES: Patient has no known allergies.  MEDICATIONS:  Current Outpatient Prescriptions  Medication Sig Dispense Refill  . acetaminophen (TYLENOL) 500 MG tablet Take 1,000 mg by mouth every 6 (six) hours as needed. For fever/pain    . fluticasone (FLONASE) 50 MCG/ACT nasal spray Place 2 sprays into the nose daily. 16 g 0  . hydrochlorothiazide (HYDRODIURIL) 25 MG tablet Take 0.5 tablets (12.5 mg total) by mouth daily. 45 tablet 3  . traZODone (DESYREL) 50 MG tablet  Take 0.5-1 tablets (25-50 mg total) by mouth at bedtime as needed for sleep. 30 tablet 3   No current facility-administered medications for this encounter.     REVIEW OF SYSTEMS: A 10+ POINT REVIEW OF SYSTEMS WAS OBTAINED including neurology, dermatology, psychiatry, cardiac, respiratory, lymph, extremities, GI, GU,  Musculoskeletal, constitutional, breasts, reproductive, HEENT.  All pertinent positives are noted in the HPI.  All others are negative.   PHYSICAL EXAM:   Vitals with BMI 07/01/2016  Height 5' 7.5"  Weight 255 lbs 3 oz  BMI 85.0  Systolic 277  Diastolic 73  Pulse 69  Respirations 18  General: Alert and oriented, in no acute distress. HEENT: Head is normocephalic. Oropharynx and oral cavity are clear. Neck: Neck is supple, no palpable cervical or supraclavicular lymphadenopathy or masses. Heart: Regular in rate and rhythm with no murmurs. Chest: Clear to auscultation bilaterally. Abdomen: Soft, non tender, non distended. Extremities: No edema in her extremities. Lymphatics: see Neck Exam Skin: No concerning lesions. Musculoskeletal: symmetric strength throughout. Neurologic: EOMI. Rapidly alternating movements intact. No obvious focalities. Speech is fluent. Coordination is intact. Psychiatric: Judgment and insight are intact. Affect is appropriate. Breasts: Around the 3 o'clock position of the left breast there is a mass measuring about 9 cm in greatest dimension. No obvious lymphadenopathy in the left axilla. No palpable masses in the right breast or axilla.  ECOG = 0   LABORATORY DATA:  Lab Results  Component Value Date   WBC 5.9 07/01/2016   HGB 12.3 07/01/2016   HCT 37.3 07/01/2016   MCV 86.5 07/01/2016   PLT 188 07/01/2016   CMP     Component Value Date/Time   NA 140 07/01/2016 1135   K 3.8 07/01/2016 1135   CL 98 06/19/2016 0858   CO2 27 07/01/2016 1135   GLUCOSE 104 07/01/2016 1135   BUN 8.8 07/01/2016 1135   CREATININE 0.8 07/01/2016 1135   CALCIUM 9.1 07/01/2016 1135   PROT 7.0 07/01/2016 1135   ALBUMIN 3.4 (L) 07/01/2016 1135   AST 15 07/01/2016 1135   ALT 14 07/01/2016 1135   ALKPHOS 85 07/01/2016 1135   BILITOT 0.64 07/01/2016 1135   GFRNONAA 101 06/19/2016 0858   GFRAA 116 06/19/2016 0858     RADIOGRAPHY: US Breast Ltd Uni Left Inc Axilla  Result  Date: 06/18/2016 CLINICAL DATA:  Left breast upper outer quadrant area of palpable concern felt by the patient. EXAM: 2D DIGITAL DIAGNOSTIC BILATERAL MAMMOGRAM WITH CAD AND ADJUNCT TOMO ULTRASOUND LEFT BREAST COMPARISON:  None available. ACR Breast Density Category b: There are scattered areas of fibroglandular density. FINDINGS: There are no suspicious masses, areas of architectural distortion or microcalcifications in the right breast. There is a regional focal asymmetry in the left breast upper outer quadrant spanning 10.3 by 7.0 by 5.4 cm. Within it, there is an ill-defined mass measuring 1.9 cm. Mammographic images were processed with CAD. On physical exam, there is a firm palpable mass in the left breast upper outer quadrant, middle depth, which measures approximately 5-6 cm. Targeted ultrasound is performed, showing left breast 2 o'clock 7 cm from the nipple hypoechoic irregular mass measuring 1.7 by 1.7 by 2.5 cm. Abnormal shadowing hypoechoic tissue is seen within the adjacent breast parenchyma, too subtle to be measured sonographically. There are two abnormal left lower axillary lymph nodes with cortical thickening of up to 1 cm. IMPRESSION: Highly suspicious left breast upper outer quadrant palpable mass, which corresponds to a large regional focal asymmetry. Sonographic evaluation demonstrates  a 2.5 cm mass at 2 o'clock, which could be used as a target for core needle biopsy, however the extent of disease is suspected to be larger than what the sonographic measurements indicate. Two abnormal left axillary lymph nodes, probably malignant. RECOMMENDATION: Ultrasound-guided core needle biopsy of left breast 2 o'clock mass and 1 of the abnormal left axillary lymph nodes. I have discussed the findings and recommendations with the patient. Results were also provided in writing at the conclusion of the visit. If applicable, a reminder letter will be sent to the patient regarding the next appointment. BI-RADS  CATEGORY  5: Highly suggestive of malignancy. Electronically Signed   By: Fidela Salisbury M.D.   On: 06/18/2016 17:50   Mm Diag Breast Tomo Bilateral  Result Date: 06/18/2016 CLINICAL DATA:  Left breast upper outer quadrant area of palpable concern felt by the patient. EXAM: 2D DIGITAL DIAGNOSTIC BILATERAL MAMMOGRAM WITH CAD AND ADJUNCT TOMO ULTRASOUND LEFT BREAST COMPARISON:  None available. ACR Breast Density Category b: There are scattered areas of fibroglandular density. FINDINGS: There are no suspicious masses, areas of architectural distortion or microcalcifications in the right breast. There is a regional focal asymmetry in the left breast upper outer quadrant spanning 10.3 by 7.0 by 5.4 cm. Within it, there is an ill-defined mass measuring 1.9 cm. Mammographic images were processed with CAD. On physical exam, there is a firm palpable mass in the left breast upper outer quadrant, middle depth, which measures approximately 5-6 cm. Targeted ultrasound is performed, showing left breast 2 o'clock 7 cm from the nipple hypoechoic irregular mass measuring 1.7 by 1.7 by 2.5 cm. Abnormal shadowing hypoechoic tissue is seen within the adjacent breast parenchyma, too subtle to be measured sonographically. There are two abnormal left lower axillary lymph nodes with cortical thickening of up to 1 cm. IMPRESSION: Highly suspicious left breast upper outer quadrant palpable mass, which corresponds to a large regional focal asymmetry. Sonographic evaluation demonstrates a 2.5 cm mass at 2 o'clock, which could be used as a target for core needle biopsy, however the extent of disease is suspected to be larger than what the sonographic measurements indicate. Two abnormal left axillary lymph nodes, probably malignant. RECOMMENDATION: Ultrasound-guided core needle biopsy of left breast 2 o'clock mass and 1 of the abnormal left axillary lymph nodes. I have discussed the findings and recommendations with the patient. Results  were also provided in writing at the conclusion of the visit. If applicable, a reminder letter will be sent to the patient regarding the next appointment. BI-RADS CATEGORY  5: Highly suggestive of malignancy. Electronically Signed   By: Fidela Salisbury M.D.   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 This was found to be concordant by Dr. Abelardo Diesel. Pathology results were discussed with the patient by telephone. The patient reported doing well after the biopsies with tenderness at the sites. Post biopsy instructions and care were reviewed and questions were answered. The patient was encouraged to call [facility] for any additional concerns. The patient was referred to [facility] on July 01, 2016. Pathology results reported by Terie Purser, RN on 06/22/2016. Electronically Signed   By: Abelardo Diesel M.D.   On: 06/22/2016 14:42  Result Date: 06/22/2016 CLINICAL DATA:  Left breast mass and abnormal left axillary lymph node for biopsy. EXAM: ULTRASOUND GUIDED LEFT BREAST CORE NEEDLE BIOPSY COMPARISON:  Previous exam(s). FINDINGS: I met with the patient and we discussed the procedure of ultrasound-guided biopsy, including benefits and alternatives. We discussed the high likelihood of a successful procedure. We discussed the risks of the procedure, including infection, bleeding, tissue injury, clip migration, and inadequate sampling. Informed written consent was given. The usual time-out protocol was performed immediately prior to the procedure. Lesion quadrant: Upper-outer quadrant Using sterile technique and 1% Lidocaine as local anesthetic, under direct ultrasound  visualization, a 12 gauge spring-loaded device was used to perform biopsy of spicular hypoechoic mass at the left breast 2 o'clock using a lateral approach. At the conclusion of the procedure a ribbon tissue marker clip was deployed into the biopsy cavity. Follow up 2 view mammogram was performed and dictated separately. Lesion quadrant: Left axilla Using sterile technique and 1% Lidocaine as local anesthetic, under direct ultrasound visualization, a 14 gauge spring-loaded device was used to perform biopsy of abnormal left axillary lymph node using a lateral approach. At the conclusion of the procedure a HydroMARK tissue marker clip was deployed into the biopsy cavity. Follow up 2 view mammogram was performed and dictated separately. IMPRESSION: Ultrasound guided biopsies of left breast and left axilla. No apparent complications. Electronically Signed: By: Abelardo Diesel M.D. On: 06/19/2016 14:57   Mm Clip Placement Left  Result Date: 06/19/2016 CLINICAL DATA:  Status post ultrasound-guided core biopsy of left breast mass and abnormal left axillary lymph node. EXAM: DIAGNOSTIC LEFT MAMMOGRAM POST ULTRASOUND BIOPSY COMPARISON:  Previous exam(s). FINDINGS: Mammographic images were obtained following ultrasound guided biopsy of abnormal mass at left breast 2 o'clock and abnormal left axillary lymph node. Cc and lateral views of the left breast demonstrate ribbon biopsy clip in the mass of concern at left breast 2 o'clock. Mammogram also demonstrates a HydroMARK clip in the abnormal lymph node of concern in the left axilla. IMPRESSION: Post biopsy mammogram demonstrating biopsy clips in the masses of concern. Final Assessment: Post Procedure Mammograms for Marker Placement Electronically Signed   By: Abelardo Diesel M.D.   On: 06/19/2016 15:23   [place] Lt Breast Bx W Loc Dev 1st Lesion Img Bx Spec US Guide  Addendum Date: 06/22/2016   ADDENDUM REPORT: 06/22/2016 14:42 ADDENDUM: Pathology revealed GRADE III INVASIVE  DUCTAL CARCINOMA of the Left breast, 2:00 o'clock. 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 This was found to be concordant by Dr. Abelardo Diesel. Pathology results were discussed with the patient by telephone. The patient reported doing well after the biopsies with tenderness at the sites. Post biopsy instructions and care were reviewed and questions were answered. The patient was encouraged to call [facility] for any additional concerns. The patient was referred to [facility] at [facility] on July 01, 2016. Pathology results reported by Terie Purser, RN on 06/22/2016. Electronically Signed   By: Abelardo Diesel M.D.   On: 06/22/2016 14:42   Result Date: 06/22/2016 CLINICAL DATA:  Left breast mass and abnormal left axillary lymph node for biopsy. EXAM: ULTRASOUND GUIDED LEFT BREAST CORE NEEDLE BIOPSY COMPARISON:  Previous exam(s). FINDINGS: I met with the patient and we discussed the procedure of ultrasound-guided biopsy, including benefits and alternatives. We discussed the high likelihood of a successful procedure. We discussed the risks of the procedure, including infection, bleeding,  tissue injury, clip migration, and inadequate sampling. Informed written consent was given. The usual time-out protocol was performed immediately prior to the procedure. Lesion quadrant: Upper-outer quadrant Using sterile technique and 1% Lidocaine as local anesthetic, under direct ultrasound visualization, a 12 gauge spring-loaded device was used to perform biopsy of spicular hypoechoic mass at the left breast 2 o'clock using a lateral approach. At the conclusion of the procedure a ribbon tissue marker clip was deployed into the biopsy cavity. Follow up 2 view mammogram was performed and dictated separately. Lesion quadrant: Left axilla Using sterile technique and 1% Lidocaine as local anesthetic, under direct ultrasound  visualization, a 14 gauge spring-loaded device was used to perform biopsy of abnormal left axillary lymph node using a lateral approach. At the conclusion of the procedure a HydroMARK tissue marker clip was deployed into the biopsy cavity. Follow up 2 view mammogram was performed and dictated separately. IMPRESSION: Ultrasound guided biopsies of left breast and left axilla. No apparent complications. Electronically Signed: By: Abelardo Diesel M.D. On: 06/19/2016 14:57      IMPRESSION/PLAN: [name] is a 46 y.o. patient with Left Breast Invasive Ductal Carcinoma, triple negative, Grade III.  Per tumor board discussion, patient is a good candidate for staging studies, genetic testing, MRI of the breasts, neoadjuvant chemotherapy, surgery, possibly with breast conservation depending on response to chemotherapy, and ultimately adjuvant radiotherapy.  It was a pleasure meeting the patient today. We discussed the risks, benefits, and side effects of radiotherapy. I recommend radiotherapy to the left breast to reduce her risk of locoregional recurrence by 2/3.  We discussed that radiation would take approximately 6 weeks to complete and that I would give the patient a few weeks to heal following surgery before starting treatment planning. If chemotherapy were to be given, this would precede radiotherapy. We spoke about acute effects including skin irritation and fatigue as well as much less common late effects including internal organ injury or irritation. We spoke about the latest technology that is used to minimize the risk of late effects for patients undergoing radiotherapy to the breast or chest wall. No guarantees of treatment were given. The patient is enthusiastic about proceeding with treatment. I look forward to participating in the patient's care.  I will await her referral back to me for postoperative follow-up and eventual CT simulation/treatment planning.   She and I are very much hoping her  disease is not metastatic; her back pain is somewhat concerning.  If she has stage IV disease, adjuvant radiotherapy would not improve her life expectancy necessarily but could still have value for local control. We will see what her staging scans show.     __________________________________________   [name], MD  This document serves as a record of services personally performed by [name], MD. It was created on her behalf by Maryla Morrow, a trained medical scribe. The creation of this record is based on the scribe's personal observations and the provider's statements to them. This document has been checked and approved by the attending provider.

## 2016-07-01 NOTE — Patient Instructions (Signed)

## 2016-07-01 NOTE — Progress Notes (Signed)
[place]  Telephone:(336) [phone] Fax:(336) [phone]     ID: [name] DOB: 1970-02-16  MR#: [id number]  IOX#:[id number]  Patient Care Team: [name], MD as PCP - General (Internal Medicine) [name], MD as Consulting Physician (General Surgery) Delray Reza, [name], MD as Consulting Physician (Oncology) [name], MD as Attending Physician (Radiation Oncology) Chauncey Cruel, MD OTHER MD:  CHIEF COMPLAINT: Triple negative breast cancer  CURRENT TREATMENT: Neoadjuvant chemotherapy   BREAST CANCER HISTORY: The patient was seen in the emergency room 05/16/2016 with nonspecific complaints but also noting that she had a tender lump in her lateral left breast which she said she had noted the day before. Exam by the emergency room physician confirmed a 3 cm soft left lateral breast mass without overlying erythema or nipple changes. This was felt to be most consistent with fibrocystic change but the patient was referred back to her primary care physician for further evaluation. She saw the physician assistant in May 18 and then Dr. Wynetta Emery on 06/12/2016 who scheduled a bilateral diagnostic mammography with tomography and left breast ultrasonography at the [facility] 06/18/2016. This found the breast density to be category B. In the left breast upper outer quadrant there was an area of asymmetry measuring up to 10.3 cm. On exam this was firm and palpable and on ultrasound there was an irregular mass measuring 2.5 cm with some subtle changes in the surrounding tissue. In the left lower axilla there were 2 suspicious-looking lymph nodes.  Biopsy of the left breast upper outer quadrant and one of the suspicious lymph nodes 06/19/2016 showed ([id number]) both to be involved by invasive ductal carcinoma, grade 3, estrogen and progesterone receptor negative, HER-2 not amplified with a signals ratio being 1.53-1.74 and the number per cell 2.35-3.05. The MIB-1  was 90%.  The patient's subsequent history is as detailed below.  INTERVAL HISTORY: [name] was evaluated in the multidisciplinary breast cancer clinic 07/01/2016 accompanied by her husband [place]. Her case was also presented in the multidisciplinary breast cancer conference that same morning. At that time a preliminary plan was proposed:Neoadjuvant chemotherapy followed by definitive surgery, adjuvant radiation, and genetics referral  REVIEW OF SYSTEMS: Aside from the mass itself, there were no specific symptoms leading to mammography. The patient denies unusual headaches, visual changes, nausea, vomiting, stiff neck, dizziness, or gait imbalance. There has been no cough, phlegm production, or pleurisy, no chest pain or pressure, and no change in bowel or bladder habits. The patient denies fever, rash, bleeding, unexplained fatigue or unexplained weight loss. She admits to some back pain and joint pain here and there, to anxiety issues, and to a history of umbilical hernia. A detailed review of systems was otherwise entirely negative.   PAST MEDICAL HISTORY: Past Medical History:  Diagnosis Date  . Malignant neoplasm of upper-outer quadrant of left breast in female, estrogen receptor negative ([place]) 06/23/2016    PAST SURGICAL HISTORY: Past Surgical History:  Procedure Laterality Date  . ANKLE SURGERY    . TUBAL LIGATION      FAMILY HISTORY Family History  Problem Relation Age of Onset  . Diabetes Mother   . Hypertension Mother   . Arthritis Mother   . Colon cancer Father   . Diabetes Father   . Heart disease Father   . Hypertension Father   . Breast cancer Maternal Grandmother   The patient's father died at age 30 from colon cancer which she never had treated. The patient's mother is living,  20 years old as of June 2018. The patient has one brother, one sister. On the mother's side there is a history of breast cancer in the grandmother, age 71, and then 1 aunt with breast cancer  age 61, and another with lung cancer age 80. There is no history of ovarian cancer in the family.  GYNECOLOGIC HISTORY:  Patient's last menstrual period was 06/11/2016 (exact date). Menarche age 31, first live birth age 30. She is GX P4. Her periods have never been regular. She had a period in June 2018 but not for 4 months before that. She took birth control for some years remotely with no complications. She is status post bilateral tubal ligation.  SOCIAL HISTORY:  The patient's husband owns and runs a food truck area did the serve mostly [place] food. 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 Her husband Shelda Truby (goes by "[name]") is originally from [place] of. The patient's children from her first marriage are Sunday Shams 47 years old (all ages as of June 2018) currently in the [employer], and [name], who is studying psychology at [name]. Together with [name] the patient's children are [name] and hila, aged 38 and 32.     ADVANCED DIRECTIVES: Not in place   HEALTH MAINTENANCE: Social History  Substance Use Topics  . Smoking status: Never Smoker  . Smokeless tobacco: Never Used  . Alcohol use No     Colonoscopy:Never   PQZ:RAQT 2018  Bone density:Never   No Known Allergies  Current Outpatient Prescriptions  Medication Sig Dispense Refill  . acetaminophen (TYLENOL) 500 MG tablet Take 1,000 mg by mouth every 6 (six) hours as needed. For fever/pain    . fluticasone (FLONASE) 50 MCG/ACT nasal spray Place 2 sprays into the nose daily. 16 g 0  . hydrochlorothiazide (HYDRODIURIL) 25 MG tablet Take 0.5 tablets (12.5 mg total) by mouth daily. 45 tablet 3  . traZODone (DESYREL) 50 MG tablet Take 0.5-1 tablets (25-50 mg total) by mouth at bedtime as needed for sleep. 30 tablet 3   No current facility-administered medications for this visit.     OBJECTIVE: Middle-aged white woman in no acute  distress  Vitals:   07/01/16 1227  BP: 133/73  Pulse: 69  Resp: 18  Temp: 98.4 F (36.9 C)     Body mass index is 39.38 kg/m.   Filed Weights   07/01/16 1227  Weight: 255 lb 3.2 oz (115.8 kg)       ECOG FS:1 - Symptomatic but completely ambulatory  Ocular: Sclerae unicteric, pupils round and equal Ear-nose-throat: Oropharynx clear and moist Lymphatic: No cervical or supraclavicular adenopathy Lungs no rales or rhonchi Heart regular rate and rhythm Abd soft, nontender, positive bowel sounds MSK no focal spinal tenderness, no joint edema Neuro: non-focal, well-oriented, appropriate affect Breasts: The right breast is unremarkable. In the left breast just lateral to the nipple there is a movable mass which measures approximately 3 cm. There is no overlying erythema or nipple retraction. Both axillae are benign.   LAB RESULTS:  CMP     Component Value Date/Time   NA 140 07/01/2016 1135   K 3.8 07/01/2016 1135   CL 98 06/19/2016 0858   CO2 27 07/01/2016 1135   GLUCOSE 104 07/01/2016 1135   BUN 8.8 07/01/2016 1135   CREATININE 0.8 07/01/2016 1135   CALCIUM 9.1 07/01/2016 1135   PROT 7.0 07/01/2016 1135   ALBUMIN  3.4 (L) 07/01/2016 1135   AST 15 07/01/2016 1135   ALT 14 07/01/2016 1135   ALKPHOS 85 07/01/2016 1135   BILITOT 0.64 07/01/2016 1135   GFRNONAA 101 06/19/2016 0858   GFRAA 116 06/19/2016 0858    No results found for: TOTALPROTELP, ALBUMINELP, A1GS, A2GS, BETS, BETA2SER, GAMS, MSPIKE, SPEI  No results found for: Nils Pyle, [facility]  Lab Results  Component Value Date   WBC 5.9 07/01/2016   NEUTROABS 4.0 07/01/2016   HGB 12.3 07/01/2016   HCT 37.3 07/01/2016   MCV 86.5 07/01/2016   PLT 188 07/01/2016      Chemistry      Component Value Date/Time   NA 140 07/01/2016 1135   K 3.8 07/01/2016 1135   CL 98 06/19/2016 0858   CO2 27 07/01/2016 1135   BUN 8.8 07/01/2016 1135   CREATININE 0.8 07/01/2016 1135      Component Value  Date/Time   CALCIUM 9.1 07/01/2016 1135   ALKPHOS 85 07/01/2016 1135   AST 15 07/01/2016 1135   ALT 14 07/01/2016 1135   BILITOT 0.64 07/01/2016 1135       No results found for: LABCA2  No components found for: [id number]  No results for input(s): INR in the last 168 hours.  Urinalysis    Component Value Date/Time   COLORURINE AMBER (A) 05/16/2016 1709   APPEARANCEUR CLOUDY (A) 05/16/2016 1709   LABSPEC 1.023 05/16/2016 1709   PHURINE 5.5 05/16/2016 1709   GLUCOSEU NEGATIVE 05/16/2016 1709   HGBUR NEGATIVE 05/16/2016 1709   BILIRUBINUR NEGATIVE 05/16/2016 1709   KETONESUR NEGATIVE 05/16/2016 1709   PROTEINUR NEGATIVE 05/16/2016 1709   UROBILINOGEN 0.2 01/04/2010 1911   NITRITE NEGATIVE 05/16/2016 1709   LEUKOCYTESUR NEGATIVE 05/16/2016 1709     STUDIES: US Breast Ltd Uni Left Inc Axilla  Result Date: 06/18/2016 CLINICAL DATA:  Left breast upper outer quadrant area of palpable concern felt by the patient. EXAM: 2D DIGITAL DIAGNOSTIC BILATERAL MAMMOGRAM WITH CAD AND ADJUNCT TOMO ULTRASOUND LEFT BREAST COMPARISON:  None available. ACR Breast Density Category b: There are scattered areas of fibroglandular density. FINDINGS: There are no suspicious masses, areas of architectural distortion or microcalcifications in the right breast. There is a regional focal asymmetry in the left breast upper outer quadrant spanning 10.3 by 7.0 by 5.4 cm. Within it, there is an ill-defined mass measuring 1.9 cm. Mammographic images were processed with CAD. On physical exam, there is a firm palpable mass in the left breast upper outer quadrant, middle depth, which measures approximately 5-6 cm. Targeted ultrasound is performed, showing left breast 2 o'clock 7 cm from the nipple hypoechoic irregular mass measuring 1.7 by 1.7 by 2.5 cm. Abnormal shadowing hypoechoic tissue is seen within the adjacent breast parenchyma, too subtle to be measured sonographically. There are two abnormal left lower axillary  lymph nodes with cortical thickening of up to 1 cm. IMPRESSION: Highly suspicious left breast upper outer quadrant palpable mass, which corresponds to a large regional focal asymmetry. Sonographic evaluation demonstrates a 2.5 cm mass at 2 o'clock, which could be used as a target for core needle biopsy, however the extent of disease is suspected to be larger than what the sonographic measurements indicate. Two abnormal left axillary lymph nodes, probably malignant. RECOMMENDATION: Ultrasound-guided core needle biopsy of left breast 2 o'clock mass and 1 of the abnormal left axillary lymph nodes. I have discussed the findings and recommendations with the patient. Results were also provided in writing at the conclusion of the  visit. If applicable, a reminder letter will be sent to the patient regarding the next appointment. BI-RADS CATEGORY  5: Highly suggestive of malignancy. Electronically Signed   By: Fidela Salisbury M.D.   On: 06/18/2016 17:50   Mm Diag Breast Tomo Bilateral  Result Date: 06/18/2016 CLINICAL DATA:  Left breast upper outer quadrant area of palpable concern felt by the patient. EXAM: 2D DIGITAL DIAGNOSTIC BILATERAL MAMMOGRAM WITH CAD AND ADJUNCT TOMO ULTRASOUND LEFT BREAST COMPARISON:  None available. ACR Breast Density Category b: There are scattered areas of fibroglandular density. FINDINGS: There are no suspicious masses, areas of architectural distortion or microcalcifications in the right breast. There is a regional focal asymmetry in the left breast upper outer quadrant spanning 10.3 by 7.0 by 5.4 cm. Within it, there is an ill-defined mass measuring 1.9 cm. Mammographic images were processed with CAD. On physical exam, there is a firm palpable mass in the left breast upper outer quadrant, middle depth, which measures approximately 5-6 cm. Targeted ultrasound is performed, showing left breast 2 o'clock 7 cm from the nipple hypoechoic irregular mass measuring 1.7 by 1.7 by 2.5 cm.  Abnormal shadowing hypoechoic tissue is seen within the adjacent breast parenchyma, too subtle to be measured sonographically. There are two abnormal left lower axillary lymph nodes with cortical thickening of up to 1 cm. IMPRESSION: Highly suspicious left breast upper outer quadrant palpable mass, which corresponds to a large regional focal asymmetry. Sonographic evaluation demonstrates a 2.5 cm mass at 2 o'clock, which could be used as a target for core needle biopsy, however the extent of disease is suspected to be larger than what the sonographic measurements indicate. Two abnormal left axillary lymph nodes, probably malignant. RECOMMENDATION: Ultrasound-guided core needle biopsy of left breast 2 o'clock mass and 1 of the abnormal left axillary lymph nodes. I have discussed the findings and recommendations with the patient. Results were also provided in writing at the conclusion of the visit. If applicable, a reminder letter will be sent to the patient regarding the next appointment. BI-RADS CATEGORY  5: Highly suggestive of malignancy. Electronically Signed   By: Fidela Salisbury M.D.   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 This was found to be concordant by Dr. Abelardo Diesel. Pathology results were discussed with the patient by telephone. The patient reported doing well after the biopsies with tenderness at the sites. Post biopsy instructions and care were reviewed and questions were answered. The patient was encouraged to call [facility] for any additional concerns. The patient was referred to [facility] on July 01, 2016. Pathology results reported by Terie Purser, RN  on 06/22/2016. Electronically Signed   By: Abelardo Diesel M.D.   On: 06/22/2016 14:42   Result Date: 06/22/2016 CLINICAL DATA:  Left breast mass and abnormal left axillary lymph node for biopsy. EXAM: ULTRASOUND GUIDED LEFT BREAST CORE NEEDLE BIOPSY COMPARISON:  Previous exam(s). FINDINGS: I met with the patient and we discussed the procedure of ultrasound-guided biopsy, including benefits and alternatives. We discussed the high likelihood of a successful procedure. We discussed the risks of the procedure, including infection, bleeding, tissue injury, clip migration, and inadequate sampling. Informed written consent was given. The usual time-out protocol was  performed immediately prior to the procedure. Lesion quadrant: Upper-outer quadrant Using sterile technique and 1% Lidocaine as local anesthetic, under direct ultrasound visualization, a 12 gauge spring-loaded device was used to perform biopsy of spicular hypoechoic mass at the left breast 2 o'clock using a lateral approach. At the conclusion of the procedure a ribbon tissue marker clip was deployed into the biopsy cavity. Follow up 2 view mammogram was performed and dictated separately. Lesion quadrant: Left axilla Using sterile technique and 1% Lidocaine as local anesthetic, under direct ultrasound visualization, a 14 gauge spring-loaded device was used to perform biopsy of abnormal left axillary lymph node using a lateral approach. At the conclusion of the procedure a HydroMARK tissue marker clip was deployed into the biopsy cavity. Follow up 2 view mammogram was performed and dictated separately. IMPRESSION: Ultrasound guided biopsies of left breast and left axilla. No apparent complications. Electronically Signed: By: Abelardo Diesel M.D. On: 06/19/2016 14:57   Mm Clip Placement Left  Result Date: 06/19/2016 CLINICAL DATA:  Status post ultrasound-guided core biopsy of left breast mass and abnormal left axillary lymph node. EXAM: DIAGNOSTIC LEFT MAMMOGRAM  POST ULTRASOUND BIOPSY COMPARISON:  Previous exam(s). FINDINGS: Mammographic images were obtained following ultrasound guided biopsy of abnormal mass at left breast 2 o'clock and abnormal left axillary lymph node. Cc and lateral views of the left breast demonstrate ribbon biopsy clip in the mass of concern at left breast 2 o'clock. Mammogram also demonstrates a HydroMARK clip in the abnormal lymph node of concern in the left axilla. IMPRESSION: Post biopsy mammogram demonstrating biopsy clips in the masses of concern. Final Assessment: Post Procedure Mammograms for Marker Placement Electronically Signed   By: Abelardo Diesel M.D.   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 This was found to be concordant by Dr. Abelardo Diesel. Pathology results were discussed with the patient by telephone. The patient reported doing well after the biopsies with tenderness at the sites. Post biopsy instructions and care were reviewed and questions were answered. The patient was encouraged to call [facility] for any additional concerns. The patient was referred to [facility] on July 01, 2016. Pathology results reported by Terie Purser, RN on 06/22/2016. Electronically Signed   By: Abelardo Diesel M.D.   On: 06/22/2016 14:42   Result Date: 06/22/2016 CLINICAL DATA:  Left breast mass and abnormal left axillary lymph node for biopsy. EXAM: ULTRASOUND GUIDED LEFT BREAST CORE NEEDLE BIOPSY COMPARISON:  Previous exam(s). FINDINGS: I met with the patient and we discussed the procedure of ultrasound-guided biopsy, including benefits and alternatives. We discussed the high likelihood of a  successful procedure. We discussed the risks of the procedure, including infection, bleeding, tissue injury, clip migration, and inadequate sampling. Informed written consent was given. The usual time-out protocol was performed immediately prior to the procedure. Lesion quadrant: Upper-outer quadrant Using sterile technique and 1% Lidocaine as local anesthetic, under direct ultrasound visualization, a 12 gauge spring-loaded device was used to perform biopsy of spicular hypoechoic mass at the left breast 2 o'clock using a lateral approach. At the conclusion of the procedure a ribbon tissue marker clip was deployed into the biopsy cavity. Follow up  2 view mammogram was performed and dictated separately. Lesion quadrant: Left axilla Using sterile technique and 1% Lidocaine as local anesthetic, under direct ultrasound visualization, a 14 gauge spring-loaded device was used to perform biopsy of abnormal left axillary lymph node using a lateral approach. At the conclusion of the procedure a HydroMARK tissue marker clip was deployed into the biopsy cavity. Follow up 2 view mammogram was performed and dictated separately. IMPRESSION: Ultrasound guided biopsies of left breast and left axilla. No apparent complications. Electronically Signed: By: Abelardo Diesel M.D. On: 06/19/2016 14:57    ELIGIBLE FOR AVAILABLE RESEARCH PROTOCOL: no   ASSESSMENT: 46 y.o. [name] woman status post left breast upper outer quadrant and left axillary lymph node biopsy 06/19/2016, both positive for a clinical T2-T3 N1, stage IIIB-C invasive ductal carcinoma, grade 3, triple negative, with an MIB-1 of 90%  (1) genetics testing pending  (2) neoadjuvant chemotherapy to consist of doxorubicin and cyclophosphamide in dose dense fashion 4 followed by paclitaxel weekly 12 given with carboplatin.  (3) definitive surgery to follow  (4) adjuvant radiation to follow surgery  (5) consider PREVENT trial--referral placed  07/01/2016   PLAN: We spent the better part of today's hour-long appointment discussing the biology of breast cancer in general, and the specifics of the patient's tumor in particular. We first reviewed the fact that cancer is not one disease but more than 100 different diseases and that it is important to keep them separate-- otherwise when friends and relatives discuss their own cancer experiences with [name] confusion can result. Similarly we explained that if breast cancer spreads to the bone or liver, the patient would not have bone cancer or liver cancer, but breast cancer in the bone and breast cancer in the liver: one cancer in three places-- not 3 different cancers which otherwise would have to be treated in 3 different ways.  We discussed the difference between local and systemic therapy. In terms of loco-regional treatment, lumpectomy plus radiation is equivalent to mastectomy as far as survival is concerned. However in cases of locally advanced disease mastectomy may be unavoidable. For that reason we are going to suggest neoadjuvant treatment and we discussed the fact that the sequence chemotherapy to surgery as opposed to surgery to chemotherapy does not affect the ultimate outcome  We then discussed the rationale for systemic therapy. There is some risk that this cancer may have already spread to other parts of her body. We are going to proceed to staging studies, as is standard in stage III disease. My hope however is that these will be negative. She understands this does not mean this cancer is not hiding in other parts of her body, outside of the breast and armpit: In fact the likelihood of that in stage III disease is high, but those occult deposits would be expected to be microscopic and not to show on any scans or lab test.  Next we went over the options for systemic therapy which are anti-estrogens, anti-HER-2 immunotherapy, and chemotherapy. [name] does not meet criteria for anti-HER-2  immunotherapy or anti-estrogens. Her only choice for systemic treatment is chemotherapy and accordingly that is what we focused on today.  More specifically she will receive cyclophosphamide and doxorubicin in dose dense fashion 4 followed by weekly paclitaxel with weekly carboplatin for a total of 12 doses. The addition of carboplatin to this regimen has been shown to increase the complete pathologic response rate, which is itself a marker for long-term disease-free survival. We discussed the possible toxicities,  side effects and complications of these agents.  Accordingly the plan will be for a port placement, staging studies, echocardiogram, and "chemotherapy school". The patient will then return to see me to operational lies her supportive medications. We are hoping to be able to start her chemotherapy no later than mid July.  [name] has a good understanding of the overall plan. She agrees with it. She knows the goal of treatment in her case is cure. She will call with any problems that may develop before her next visit here.  Chauncey Cruel, MD   07/01/2016 12:30 PM Medical Oncology and Hematology [facility] [address] [place], [place] [address] Tel. [phone]    Fax. [phone]

## 2016-07-02 ENCOUNTER — Telehealth: Payer: Self-pay | Admitting: Internal Medicine

## 2016-07-02 MED ORDER — ALPRAZOLAM 0.5 MG PO TABS: ORAL_TABLET | ORAL | 0 refills | Status: DC

## 2016-07-02 NOTE — Progress Notes (Signed)
START ON PATHWAY REGIMEN - Breast     A cycle is every 21 days:     Doxorubicin      Cyclophosphamide      Paclitaxel      Carboplatin   **Always confirm dose/schedule in your pharmacy ordering system**    Patient Characteristics: Preoperative or Nonsurgical Candidate (Clinical Staging), Neoadjuvant Therapy followed by Surgery, Invasive Disease, Chemotherapy, HER2 Negative/Unknown/Equivocal, ER Negative, Platinum Therapy Indicated Therapeutic Status: Preoperative or Nonsurgical Candidate (Clinical Staging) AJCC M Category: cM0 AJCC Grade: G3 Breast Surgical Plan: Neoadjuvant Therapy followed by Surgery ER Status: Negative (-) AJCC 8 Stage Grouping: IIIC HER2 Status: Negative (-) AJCC T Category: cT3 AJCC N Category: cN1 PR Status: Negative (-) Type of Therapy: Platinum Therapy Indicated Intent of Therapy: Curative Intent, Discussed with Patient

## 2016-07-02 NOTE — Telephone Encounter (Signed)
Spoke with patient this a.m. via phone as a follow-up to see how she is doing. Patient reports that she is feeling overwhelmed after meeting with the oncology treatment team yesterday. She has good family support. She is scheduled to have an MRI in 2 days. She is usually not claustrophobic but is feeling anxious about having it done. I recommended taking a low dose of Xanax and hour before the MRI. Patient was agreeable to this. She also wanted to know when her next appointment with me will be. The last appointment was canceled after she had a Pap smear done through the cancer center. I told her that it is more important for her to get everything done in regards to this new cancer diagnoses but I will have our schedulers give her a follow-up appointment sometime within the next several weeks. Rxn for Xanax to [facility]. Patient will pick it up tomorrow. Patient informed that she must have a designated driver to one from the MRI since she will take Xanax prior to the study.

## 2016-07-03 ENCOUNTER — Other Ambulatory Visit: Payer: Self-pay | Admitting: Internal Medicine

## 2016-07-03 ENCOUNTER — Other Ambulatory Visit: Payer: Self-pay | Admitting: Oncology

## 2016-07-03 ENCOUNTER — Telehealth: Payer: Self-pay | Admitting: *Deleted

## 2016-07-03 ENCOUNTER — Other Ambulatory Visit: Payer: Self-pay | Admitting: *Deleted

## 2016-07-03 ENCOUNTER — Encounter: Payer: Self-pay | Admitting: *Deleted

## 2016-07-03 DIAGNOSIS — Z171 Estrogen receptor negative status [ER-]: Principal | ICD-10-CM

## 2016-07-03 DIAGNOSIS — C50412 Malignant neoplasm of upper-outer quadrant of left female breast: Secondary | ICD-10-CM

## 2016-07-03 NOTE — Telephone Encounter (Signed)
Spoke with patient from [facility] 6/27.  I have her port placement scheduled for 7/10 at 930am with IR.  She is aware of appointment.  Encouraged her to call with any needs or concerns.

## 2016-07-04 ENCOUNTER — Ambulatory Visit (HOSPITAL_COMMUNITY)
Admission: RE | Admit: 2016-07-04 | Discharge: 2016-07-04 | Disposition: A | Discharge status: Home / Self Care | Payer: Medicaid Other | Source: Ambulatory Visit | Attending: Oncology | Admitting: Oncology

## 2016-07-04 DIAGNOSIS — C773 Secondary and unspecified malignant neoplasm of axilla and upper limb lymph nodes: Secondary | ICD-10-CM | POA: Insufficient documentation

## 2016-07-04 DIAGNOSIS — C50412 Malignant neoplasm of upper-outer quadrant of left female breast: Secondary | ICD-10-CM | POA: Diagnosis not present

## 2016-07-04 DIAGNOSIS — Z171 Estrogen receptor negative status [ER-]: Secondary | ICD-10-CM | POA: Diagnosis not present

## 2016-07-04 MED ORDER — GADOBENATE DIMEGLUMINE 529 MG/ML IV SOLN
20.0000 mL | Freq: Once | INTRAVENOUS | Status: AC | PRN
  Administered 2016-07-04: 20 mL via INTRAVENOUS

## 2016-07-06 ENCOUNTER — Ambulatory Visit (HOSPITAL_COMMUNITY)
Admission: RE | Admit: 2016-07-06 | Discharge: 2016-07-06 | Disposition: A | Discharge status: Home / Self Care | Payer: Medicaid Other | Source: Ambulatory Visit | Attending: Oncology | Admitting: Oncology

## 2016-07-06 ENCOUNTER — Encounter (HOSPITAL_COMMUNITY): Payer: Self-pay

## 2016-07-06 ENCOUNTER — Encounter: Payer: Self-pay | Admitting: Oncology

## 2016-07-06 ENCOUNTER — Encounter (HOSPITAL_COMMUNITY)
Admission: RE | Admit: 2016-07-06 | Discharge: 2016-07-06 | Disposition: A | Discharge status: Home / Self Care | Payer: Medicaid Other | Source: Ambulatory Visit | Attending: Oncology | Admitting: Oncology

## 2016-07-06 DIAGNOSIS — K8020 Calculus of gallbladder without cholecystitis without obstruction: Secondary | ICD-10-CM | POA: Insufficient documentation

## 2016-07-06 DIAGNOSIS — Z171 Estrogen receptor negative status [ER-]: Secondary | ICD-10-CM | POA: Insufficient documentation

## 2016-07-06 DIAGNOSIS — C50412 Malignant neoplasm of upper-outer quadrant of left female breast: Secondary | ICD-10-CM | POA: Diagnosis present

## 2016-07-06 DIAGNOSIS — M1288 Other specific arthropathies, not elsewhere classified, other specified site: Secondary | ICD-10-CM | POA: Insufficient documentation

## 2016-07-06 MED ORDER — IOPAMIDOL (ISOVUE-300) INJECTION 61%
75.0000 mL | Freq: Once | INTRAVENOUS | Status: AC | PRN
  Administered 2016-07-06: 75 mL via INTRAVENOUS

## 2016-07-06 MED ORDER — TECHNETIUM TC 99M MEDRONATE IV KIT
19.2000 | PACK | Freq: Once | INTRAVENOUS | Status: AC | PRN
  Administered 2016-07-06: 19.2 via INTRAVENOUS

## 2016-07-06 MED ORDER — IOPAMIDOL (ISOVUE-300) INJECTION 61%
INTRAVENOUS | Status: AC
  Administered 2016-07-06: 75 mL via INTRAVENOUS
  Filled 2016-07-06: qty 75

## 2016-07-06 NOTE — Progress Notes (Signed)
Called patient from 3 day look-back list to introduce myself as her Arboriculturist and to ask if she has financial questions or concerns. Patient states she applied for [facility] Medicaid and wasn't sure if she would be approved or not. Patient and spouse are self-employed and income is limited. Advised her that if not approved for [facility] Medicaid, she would automatically receive a 55% discount for being uninsured and if any treamtment drugs are eligible for assistance, Rob would reach out to her to apply.  Patient had several questions regarding assistance at [facility] and Wellness and the orange card. Advised her to reach out to them to reschedule missed appointment regarding this.  Discussed Advertising account executive and income. Patient would like to apply for the grant and will bring proof of income with her for our scheduled appointment on 07/17/16. Patient has my contact name and number for any additional financial questions or concerns and was very appreciative of the phone call.

## 2016-07-07 ENCOUNTER — Telehealth: Payer: Self-pay | Admitting: *Deleted

## 2016-07-07 ENCOUNTER — Encounter: Payer: Self-pay | Admitting: *Deleted

## 2016-07-07 NOTE — Telephone Encounter (Signed)
Pt called for results of scans- results reviewed with pt including noted areas of arthritis on bone scan with no evidence of bony metastasis.

## 2016-07-10 ENCOUNTER — Ambulatory Visit (HOSPITAL_COMMUNITY)
Admission: RE | Admit: 2016-07-10 | Discharge: 2016-07-10 | Disposition: A | Discharge status: Home / Self Care | Payer: Medicaid Other | Source: Ambulatory Visit | Attending: Oncology | Admitting: Oncology

## 2016-07-10 ENCOUNTER — Other Ambulatory Visit: Payer: Self-pay

## 2016-07-10 ENCOUNTER — Other Ambulatory Visit: Payer: Self-pay | Admitting: *Deleted

## 2016-07-10 DIAGNOSIS — I358 Other nonrheumatic aortic valve disorders: Secondary | ICD-10-CM | POA: Diagnosis not present

## 2016-07-10 DIAGNOSIS — Z171 Estrogen receptor negative status [ER-]: Secondary | ICD-10-CM

## 2016-07-10 DIAGNOSIS — C50412 Malignant neoplasm of upper-outer quadrant of left female breast: Secondary | ICD-10-CM

## 2016-07-10 DIAGNOSIS — I10 Essential (primary) hypertension: Secondary | ICD-10-CM | POA: Insufficient documentation

## 2016-07-10 DIAGNOSIS — I360 Nonrheumatic tricuspid (valve) stenosis: Secondary | ICD-10-CM

## 2016-07-10 LAB — ECHOCARDIOGRAM COMPLETE
AVLVOTPG: 6 mmHg
CHL CUP RV SYS PRESS: 30 mmHg
CHL CUP TV REG PEAK VELOCITY: 262 cm/s
E decel time: 236 msec
EERAT: 7.61
FS: 40 % (ref 28–44)
IVS/LV PW RATIO, ED: 1.33
LA diam index: 1.54 cm/m2
LA vol index: 24.5 mL/m2
LA vol: 58.9 mL
LASIZE: 37 mm
LAVOLA4C: 65.5 mL
LDCA: 2.84 cm2
LEFT ATRIUM END SYS DIAM: 37 mm
LV E/e'average: 7.61
LV PW d: 8.17 mm — AB (ref 0.6–1.1)
LVEEMED: 7.61
LVELAT: 13.4 cm/s
LVOT SV: 81 mL
LVOT VTI: 28.6 cm
LVOT diameter: 19 mm
LVOT peak vel: 125 cm/s
MV Dec: 236
MV pk E vel: 102 m/s
MVPG: 4 mmHg
MVPKAVEL: 60.2 m/s
RV LATERAL S' VELOCITY: 9.14 cm/s
RV TAPSE: 24.9 mm
TDI e' lateral: 13.4
TDI e' medial: 5.98
TRMAXVEL: 262 cm/s

## 2016-07-10 NOTE — Progress Notes (Signed)
  Echocardiogram 2D Echocardiogram has been performed.  [name] [name] [name] 07/10/2016, 12:28 PM

## 2016-07-10 NOTE — Progress Notes (Signed)
[facility] Counseling Note  Counselor called patient to f/u on pt's emotional distress following meeting on 6/27 at [facility]. Pt was busy, so counselor left message with pt's husband, [name].   Westly Pam, [place] [facility] [place]

## 2016-07-13 ENCOUNTER — Other Ambulatory Visit: Payer: Self-pay | Admitting: Radiology

## 2016-07-14 ENCOUNTER — Ambulatory Visit (HOSPITAL_COMMUNITY)
Admission: RE | Admit: 2016-07-14 | Discharge: 2016-07-14 | Disposition: A | Discharge status: Home / Self Care | Payer: Medicaid Other | Source: Ambulatory Visit | Attending: Oncology | Admitting: Oncology

## 2016-07-14 ENCOUNTER — Encounter (HOSPITAL_COMMUNITY): Payer: Self-pay

## 2016-07-14 ENCOUNTER — Other Ambulatory Visit: Payer: Self-pay | Admitting: Oncology

## 2016-07-14 DIAGNOSIS — Z803 Family history of malignant neoplasm of breast: Secondary | ICD-10-CM | POA: Diagnosis not present

## 2016-07-14 DIAGNOSIS — Z7951 Long term (current) use of inhaled steroids: Secondary | ICD-10-CM | POA: Diagnosis not present

## 2016-07-14 DIAGNOSIS — Z171 Estrogen receptor negative status [ER-]: Principal | ICD-10-CM

## 2016-07-14 DIAGNOSIS — C779 Secondary and unspecified malignant neoplasm of lymph node, unspecified: Secondary | ICD-10-CM | POA: Insufficient documentation

## 2016-07-14 DIAGNOSIS — C50412 Malignant neoplasm of upper-outer quadrant of left female breast: Secondary | ICD-10-CM

## 2016-07-14 HISTORY — PX: IR FLUORO GUIDE PORT INSERTION RIGHT: IMG5741

## 2016-07-14 HISTORY — PX: IR US GUIDE VASC ACCESS RIGHT: IMG2390

## 2016-07-14 LAB — CBC
HEMATOCRIT: 36.9 % (ref 36.0–46.0)
Hemoglobin: 12.4 g/dL (ref 12.0–15.0)
MCH: 28.1 pg (ref 26.0–34.0)
MCHC: 33.6 g/dL (ref 30.0–36.0)
MCV: 83.5 fL (ref 78.0–100.0)
Platelets: 209 10*3/uL (ref 150–400)
RBC: 4.42 MIL/uL (ref 3.87–5.11)
RDW: 12.6 % (ref 11.5–15.5)
WBC: 4.9 10*3/uL (ref 4.0–10.5)

## 2016-07-14 LAB — BASIC METABOLIC PANEL WITH GFR
Anion gap: 7 (ref 5–15)
BUN: 11 mg/dL (ref 6–20)
CO2: 27 mmol/L (ref 22–32)
Calcium: 8.9 mg/dL (ref 8.9–10.3)
Chloride: 104 mmol/L (ref 101–111)
Creatinine, Ser: 0.64 mg/dL (ref 0.44–1.00)
GFR calc Af Amer: >60 mL/min
GFR calc non Af Amer: >60 mL/min
Glucose, Bld: 109 mg/dL — ABNORMAL HIGH (ref 65–99)
Potassium: 3.8 mmol/L (ref 3.5–5.1)
Sodium: 138 mmol/L (ref 135–145)

## 2016-07-14 LAB — PROTIME-INR
INR: 1.02
PROTHROMBIN TIME: 13.4 s (ref 11.4–15.2)

## 2016-07-14 LAB — APTT: aPTT: 27 s (ref 24–36)

## 2016-07-14 MED ORDER — LIDOCAINE-EPINEPHRINE (PF) 2 %-1:200000 IJ SOLN
INTRAMUSCULAR | Status: AC | PRN
  Administered 2016-07-14: 20 mL via INTRADERMAL

## 2016-07-14 MED ORDER — HEPARIN SOD (PORK) LOCK FLUSH 100 UNIT/ML IV SOLN
INTRAVENOUS | Status: AC | PRN
  Administered 2016-07-14: 500 [IU] via INTRAVENOUS

## 2016-07-14 MED ORDER — CEFAZOLIN SODIUM-DEXTROSE 2-4 GM/100ML-% IV SOLN
INTRAVENOUS | Status: AC
  Filled 2016-07-14: qty 100

## 2016-07-14 MED ORDER — MIDAZOLAM HCL 2 MG/2ML IJ SOLN
INTRAMUSCULAR | Status: AC | PRN
  Administered 2016-07-14 (×2): 1 mg via INTRAVENOUS

## 2016-07-14 MED ORDER — MIDAZOLAM HCL 2 MG/2ML IJ SOLN
INTRAMUSCULAR | Status: AC
  Filled 2016-07-14: qty 4

## 2016-07-14 MED ORDER — SODIUM CHLORIDE 0.9 % IV SOLN
INTRAVENOUS | Status: DC
  Administered 2016-07-14: 10:00:00 via INTRAVENOUS

## 2016-07-14 MED ORDER — LIDOCAINE-EPINEPHRINE (PF) 2 %-1:200000 IJ SOLN
INTRAMUSCULAR | Status: AC
  Filled 2016-07-14: qty 20

## 2016-07-14 MED ORDER — CEFAZOLIN SODIUM-DEXTROSE 2-4 GM/100ML-% IV SOLN
2.0000 g | INTRAVENOUS | Status: AC
  Administered 2016-07-14: 2 g via INTRAVENOUS

## 2016-07-14 MED ORDER — FENTANYL CITRATE (PF) 100 MCG/2ML IJ SOLN
INTRAMUSCULAR | Status: AC
  Filled 2016-07-14: qty 4

## 2016-07-14 MED ORDER — HEPARIN SOD (PORK) LOCK FLUSH 100 UNIT/ML IV SOLN
INTRAVENOUS | Status: AC
  Filled 2016-07-14: qty 5

## 2016-07-14 MED ORDER — FENTANYL CITRATE (PF) 100 MCG/2ML IJ SOLN
INTRAMUSCULAR | Status: AC | PRN
  Administered 2016-07-14 (×2): 50 ug via INTRAVENOUS

## 2016-07-14 MED ORDER — LIDOCAINE HCL 1 % IJ SOLN
INTRAMUSCULAR | Status: AC
  Filled 2016-07-14: qty 20

## 2016-07-14 NOTE — Procedures (Signed)
   R IJ Port cathter placement with US and fluoroscopy No complication No blood loss. See complete dictation in [facility] PACS.  D. Alden Bensinger MD Main # [phone] Pager  [phone]      

## 2016-07-14 NOTE — Discharge Instructions (Signed)
May remove gauze in 24 hours and shower. Call cancer center at [phone] for any problems or questions.  911 for emergencies.       Implanted Port Insertion, Care After This sheet gives you information about how to care for yourself after your procedure. Your health care provider may also give you more specific instructions. If you have problems or questions, contact your health care provider. What can I expect after the procedure? After your procedure, it is common to have:  Discomfort at the port insertion site.  Bruising on the skin over the port. This should improve over 3-4 days.  Follow these instructions at home: [facility] care  After your port is placed, you will get a manufacturer's information card. The card has information about your port. Keep this card with you at all times.  Take care of the port as told by your health care provider. Ask your health care provider if you or a family member can get training for taking care of the port at home. A home health care nurse may also take care of the port.  Make sure to remember what type of port you have. Incision care  Follow instructions from your health care provider about how to take care of your port insertion site. Make sure you: ? Wash your hands with soap and water before you change your bandage (dressing). If soap and water are not available, use hand sanitizer. ? Change your dressing as told by your health care provider. ? Leave stitches (sutures), skin glue, or adhesive strips in place. These skin closures may need to stay in place for 2 weeks or longer. If adhesive strip edges start to loosen and curl up, you may trim the loose edges. Do not remove adhesive strips completely unless your health care provider tells you to do that.  Check your port insertion site every day for signs of infection. Check for: ? More redness, swelling, or pain. ? More fluid or blood. ? Warmth. ? Pus or a bad smell. General  instructions  Do not take baths, swim, or use a hot tub until your health care provider approves.  Do not lift anything that is heavier than 10 lb (4.5 kg) for a week, or as told by your health care provider.  Ask your health care provider when it is okay to: ? Return to work or school. ? Resume usual physical activities or sports.  Do not drive for 24 hours if you were given a medicine to help you relax (sedative).  Take over-the-counter and prescription medicines only as told by your health care provider.  Wear a medical alert bracelet in case of an emergency. This will tell any health care providers that you have a port.  Keep all follow-up visits as told by your health care provider. This is important. Contact a health care provider if:  You cannot flush your port with saline as directed, or you cannot draw blood from the port.  You have a fever or chills.  You have more redness, swelling, or pain around your port insertion site.  You have more fluid or blood coming from your port insertion site.  Your port insertion site feels warm to the touch.  You have pus or a bad smell coming from the port insertion site. Get help right away if:  You have chest pain or shortness of breath.  You have bleeding from your port that you cannot control. Summary  Take care of the port as told  by your health care provider.  Change your dressing as told by your health care provider.  Keep all follow-up visits as told by your health care provider. This information is not intended to replace advice given to you by your health care provider. Make sure you discuss any questions you have with your health care provider. Document Released: 10/12/2012 Document Revised: 11/13/2015 Document Reviewed: 11/13/2015 Elsevier Interactive Patient Education  2017 [place] An implanted port is a type of central line that is placed under the skin. Central lines are  used to provide IV access when treatment or nutrition needs to be given through a persons veins. Implanted ports are used for long-term IV access. An implanted port may be placed because:  You need IV medicine that would be irritating to the small veins in your hands or arms.  You need long-term IV medicines, such as antibiotics.  You need IV nutrition for a long period.  You need frequent blood draws for lab tests.  You need dialysis.  Implanted ports are usually placed in the chest area, but they can also be placed in the upper arm, the abdomen, or the leg. An implanted port has two main parts:  Reservoir. The reservoir is round and will appear as a small, raised area under your skin. The reservoir is the part where a needle is inserted to give medicines or draw blood.  Catheter. The catheter is a thin, flexible tube that extends from the reservoir. The catheter is placed into a large vein. Medicine that is inserted into the reservoir goes into the catheter and then into the vein.  How will I care for my incision site? Do not get the incision site wet. Bathe or shower as directed by your health care provider. How is my port accessed? Special steps must be taken to access the port:  Before the port is accessed, a numbing cream can be placed on the skin. This helps numb the skin over the port site.  Your health care provider uses a sterile technique to access the port. ? Your health care provider must put on a mask and sterile gloves. ? The skin over your port is cleaned carefully with an antiseptic and allowed to dry. ? The port is gently pinched between sterile gloves, and a needle is inserted into the port.  Only "non-coring" port needles should be used to access the port. Once the port is accessed, a blood return should be checked. This helps ensure that the port is in the vein and is not clogged.  If your port needs to remain accessed for a constant infusion, a clear  (transparent) bandage will be placed over the needle site. The bandage and needle will need to be changed every week, or as directed by your health care provider.  Keep the bandage covering the needle clean and dry. Do not get it wet. Follow your health care providers instructions on how to take a shower or bath while the port is accessed.  If your port does not need to stay accessed, no bandage is needed over the port.  What is flushing? Flushing helps keep the port from getting clogged. Follow your health care providers instructions on how and when to flush the port. Ports are usually flushed with saline solution or a medicine called heparin. The need for flushing will depend on how the port is used.  If the port is used for intermittent medicines or blood draws,  the port will need to be flushed: ? After medicines have been given. ? After blood has been drawn. ? As part of routine maintenance.  If a constant infusion is running, the port may not need to be flushed.  How long will my port stay implanted? The port can stay in for as long as your health care provider thinks it is needed. When it is time for the port to come out, surgery will be done to remove it. The procedure is similar to the one performed when the port was put in. When should I seek immediate medical care? When you have an implanted port, you should seek immediate medical care if:  You notice a bad smell coming from the incision site.  You have swelling, redness, or drainage at the incision site.  You have more swelling or pain at the port site or the surrounding area.  You have a fever that is not controlled with medicine.  This information is not intended to replace advice given to you by your health care provider. Make sure you discuss any questions you have with your health care provider. Document Released: 12/22/2004 Document Revised: 05/30/2015 Document Reviewed: 08/29/2012 Elsevier Interactive Patient  Education  2017 [place].       Moderate Conscious Sedation, Adult, Care After These instructions provide you with information about caring for yourself after your procedure. Your health care provider may also give you more specific instructions. Your treatment has been planned according to current medical practices, but problems sometimes occur. Call your health care provider if you have any problems or questions after your procedure. What can I expect after the procedure? After your procedure, it is common:  To feel sleepy for several hours.  To feel clumsy and have poor balance for several hours.  To have poor judgment for several hours.  To vomit if you eat too soon.  Follow these instructions at home: For at least 24 hours after the procedure:   Do not: ? Participate in activities where you could fall or become injured. ? Drive. ? Use heavy machinery. ? Drink alcohol. ? Take sleeping pills or medicines that cause drowsiness. ? Make important decisions or sign legal documents. ? Take care of children on your own.  Rest. Eating and drinking  Follow the diet recommended by your health care provider.  If you vomit: ? Drink water, juice, or soup when you can drink without vomiting. ? Make sure you have little or no nausea before eating solid foods. General instructions  Have a responsible adult stay with you until you are awake and alert.  Take over-the-counter and prescription medicines only as told by your health care provider.  If you smoke, do not smoke without supervision.  Keep all follow-up visits as told by your health care provider. This is important. Contact a health care provider if:  You keep feeling nauseous or you keep vomiting.  You feel light-headed.  You develop a rash.  You have a fever. Get help right away if:  You have trouble breathing. This information is not intended to replace advice given to you by your health care provider.  Make sure you discuss any questions you have with your health care provider. Document Released: 10/12/2012 Document Revised: 05/27/2015 Document Reviewed: 04/13/2015 Elsevier Interactive Patient Education  [employer].

## 2016-07-14 NOTE — Consult Note (Signed)
Chief Complaint: Patient was seen in consultation today for Port-A-Cath placement   Referring Physician(s): Magrinat,Gustav C  Supervising Physician: Arne Cleveland  Patient Status: [facility] - Out-pt  History of Present Illness: [name] is a 46 y.o. female with history of recently diagnosed left breast carcinoma who presents today for Port-A-Cath placement for planned chemotherapy.  Past Medical History:  Diagnosis Date  . Malignant neoplasm of upper-outer quadrant of left breast in female, estrogen receptor negative ([place]) 06/23/2016    Past Surgical History:  Procedure Laterality Date  . ANKLE SURGERY    . TUBAL LIGATION      Allergies: Patient has no known allergies.  Medications: Prior to Admission medications   Medication Sig Start Date End Date Taking? Authorizing Provider  acetaminophen (TYLENOL) 500 MG tablet Take 1,000 mg by mouth every 6 (six) hours as needed. For fever/pain   Yes [provider]  hydrochlorothiazide (HYDRODIURIL) 25 MG tablet Take 0.5 tablets (12.5 mg total) by mouth daily. 06/12/16  Yes [name], MD  traZODone (DESYREL) 50 MG tablet Take 0.5-1 tablets (25-50 mg total) by mouth at bedtime as needed for sleep. 06/23/16  Yes [name], MD  ALPRAZolam Duanne Moron) 0.5 MG tablet 1 tab 1/2 to 1 hr prior to MRI study.  May repeat x 1 if needed. Patient not taking: Reported on 07/10/2016 07/02/16   [name], MD  fluticasone [facility]) 50 MCG/ACT nasal spray Place 2 sprays into the nose daily. 01/27/11 01/27/12  Glendell Docker, NP     Family History  Problem Relation Age of Onset  . Diabetes Mother   . Hypertension Mother   . Arthritis Mother   . Colon cancer Father   . Diabetes Father   . Heart disease Father   . Hypertension Father   . Breast cancer Maternal Grandmother   . Breast cancer Maternal Aunt   . Cervical cancer Maternal Aunt   . Lung cancer Maternal Aunt     Social History   Social History  .  Marital status: Married    Spouse name: N/A  . Number of children: N/A  . Years of education: N/A   Social History Main Topics  . Smoking status: Never Smoker  . Smokeless tobacco: Never Used  . Alcohol use No  . Drug use: No  . Sexual activity: Not Asked   Other Topics Concern  . None   Social History Narrative  . None      Review of Systems denies fever, chest pain, dyspnea, cough, abdominal/back pain, nausea, vomiting or abnormal bleeding. She does have occasional headaches.  Vital Signs: BP (!) 138/94 (BP Location: Right Arm)   Pulse 68   Temp 98.2 F (36.8 C) (Oral)   Resp 16   LMP 07/06/2016   SpO2 99%   Physical Exam Awake, alert. Chest clear to auscultation bilaterally. Heart with regular rate and rhythm. Abdomen obese,  soft, positive bowel sounds, nontender; lower extremities with full range of motion  Mallampati Score:     Imaging: Ct Chest W Contrast  Result Date: 07/06/2016 CLINICAL DATA:  Left breast cancer. EXAM: CT CHEST WITH CONTRAST TECHNIQUE: Multidetector CT imaging of the chest was performed during intravenous contrast administration. CONTRAST:  75 cc Isovue-300. COMPARISON:  03/02/2005. FINDINGS: Cardiovascular: Vascular structures are unremarkable. Heart is at the upper limits of normal in size. No pericardial effusion. Mediastinum/Nodes: Masslike soft tissue is seen in the lateral aspect of the left breast, measuring approximately 3.4 cm. There are  2 enlarged lymph nodes in the left axilla, measuring up to 1.6 cm in short axis. No mediastinal, hilar, right axillary or internal mammary adenopathy. Esophagus is grossly unremarkable. Lungs/Pleura: There are scattered subpleural lymph nodes measuring up to 4 mm, unchanged. No pleural fluid. Airway is unremarkable. Upper Abdomen: Visualized portion of the liver is unremarkable. A stone is partially imaged in the gallbladder. Visualized portions of the adrenal glands, kidneys, spleen, pancreas, stomach and  bowel are grossly unremarkable. No upper abdominal adenopathy. Musculoskeletal: No worrisome lytic or sclerotic lesions. IMPRESSION: 1. Left breast mass with left axillary adenopathy. No additional evidence of metastatic disease. 2. Cholelithiasis. Electronically Signed   By: Lorin Picket M.D.   On: 07/06/2016 08:57   Nm Bone Scan Whole Body  Result Date: 07/06/2016 CLINICAL DATA:  Left-sided breast carcinoma EXAM: NUCLEAR MEDICINE WHOLE BODY BONE SCAN TECHNIQUE: Whole body anterior and posterior images were obtained approximately 3 hours after intravenous injection of radiopharmaceutical. RADIOPHARMACEUTICALS:  19.2 mCi Technetium-54mMDP IV COMPARISON:  Chest CT July 06, 2016 FINDINGS: There is a punctate focus of increased radiotracer uptake at the level of the pedicle on the right at T11. There is modest increased uptake in each knee and shoulder, likely of arthropathic etiology. Elsewhere, the distribution of uptake of radiotracer is within normal limits. Kidneys are noted in the flank positions bilaterally. IMPRESSION: Small focus of increased uptake in the region of the pedicle of T11 on the right. CT through this area shows equivocal arthropathy in this area. Areas of increased uptake in the shoulders and knees is almost certainly of arthropathic etiology. There are no findings which are felt to be indicative of bony metastatic disease. Electronically Signed   By: WLowella GripIII M.D.   On: 07/06/2016 12:00   Mr Breast Bilateral W Wo Contrast  Result Date: 07/06/2016 CLINICAL DATA:  Newly diagnosed left breast cancer with metastatic lymph node. LABS:  GFR greater than 60 EXAM: BILATERAL BREAST MRI WITH AND WITHOUT CONTRAST TECHNIQUE: Multiplanar, multisequence MR images of both breasts were obtained prior to and following the intravenous administration of 20 ml of MultiHance. THREE-DIMENSIONAL MR IMAGE RENDERING ON INDEPENDENT WORKSTATION: Three-dimensional MR images were rendered by  post-processing of the original MR data on an independent workstation. The three-dimensional MR images were interpreted, and findings are reported in the following complete MRI report for this study. Three dimensional images were evaluated at the independent DynaCad workstation COMPARISON:  Previous exam(s). FINDINGS: Breast composition: b. Scattered fibroglandular tissue. Background parenchymal enhancement: Moderate. Right breast: No mass or abnormal enhancement. Left breast: Irregular enhancing mass within the upper-outer quadrant of the left breast, at middle depth, measuring 2.5 x 2 x 1.6 cm (transverse by AP by craniocaudal dimensions) with biopsy clip artifact, consistent with biopsy proven carcinoma. Additional extensive non-mass enhancement extending anterior, superior and inferior from the biopsy-proven carcinoma. This extensive non-mass enhancement within the outer left breast extends from posterior to anterior/subareolar depth, measuring at least 10 cm AP dimension and 7 cm craniocaudal dimension, almost certainly indicating multifocal and multicentric disease. Lymph nodes: 3 enlarged/morphologically abnormal lymph nodes are identified in the left axilla, 1 of which corresponds to the biopsy-proven lymph node metastasis. No enlarged or morphologically abnormal lymph nodes are identified in the right axilla or within the internal mammary chain regions. Ancillary findings:  None. IMPRESSION: 1. Biopsy-proven carcinoma within the upper-outer quadrant of the left breast, at middle depth, measuring 2.5 cm, with associated biopsy clip artifact. 2. Extensive highly suspicious non-mass enhancement within  the outer left breast, involving the upper outer quadrant and lower outer quadrant, extending from posterior depth to anterior/subareolar depth, measuring over 10 cm AP extent and 7 cm craniocaudal dimension, almost certainly indicating multicentric disease and corresponding to the extent of asymmetry seen on  initial diagnostic mammogram of 06/18/2016. 3. Three enlarged/morphologically abnormal lymph nodes in the left axilla, 1 of which corresponds to the biopsy-proven lymph node metastasis. No additional lymphadenopathy in the right axilla or internal mammary chain regions. 4. No evidence of malignancy within the right breast. RECOMMENDATION: 1. Per current treatment plan for patient's known left breast cancer. 2. If breast conservation therapy for the left breast was considered, MRI-guided biopsies would be needed for the most anterior and superior aspects of the extensive non-mass enhancement in the outer left breast to confirm extent of the presumed multicentric disease. BI-RADS CATEGORY  6: Known biopsy-proven malignancy. Electronically Signed   By: Franki Cabot M.D.   On: 07/06/2016 11:53   US Breast Ltd Uni Left Inc Axilla  Result Date: 06/18/2016 CLINICAL DATA:  Left breast upper outer quadrant area of palpable concern felt by the patient. EXAM: 2D DIGITAL DIAGNOSTIC BILATERAL MAMMOGRAM WITH CAD AND ADJUNCT TOMO ULTRASOUND LEFT BREAST COMPARISON:  None available. ACR Breast Density Category b: There are scattered areas of fibroglandular density. FINDINGS: There are no suspicious masses, areas of architectural distortion or microcalcifications in the right breast. There is a regional focal asymmetry in the left breast upper outer quadrant spanning 10.3 by 7.0 by 5.4 cm. Within it, there is an ill-defined mass measuring 1.9 cm. Mammographic images were processed with CAD. On physical exam, there is a firm palpable mass in the left breast upper outer quadrant, middle depth, which measures approximately 5-6 cm. Targeted ultrasound is performed, showing left breast 2 o'clock 7 cm from the nipple hypoechoic irregular mass measuring 1.7 by 1.7 by 2.5 cm. Abnormal shadowing hypoechoic tissue is seen within the adjacent breast parenchyma, too subtle to be measured sonographically. There are two abnormal left lower  axillary lymph nodes with cortical thickening of up to 1 cm. IMPRESSION: Highly suspicious left breast upper outer quadrant palpable mass, which corresponds to a large regional focal asymmetry. Sonographic evaluation demonstrates a 2.5 cm mass at 2 o'clock, which could be used as a target for core needle biopsy, however the extent of disease is suspected to be larger than what the sonographic measurements indicate. Two abnormal left axillary lymph nodes, probably malignant. RECOMMENDATION: Ultrasound-guided core needle biopsy of left breast 2 o'clock mass and 1 of the abnormal left axillary lymph nodes. I have discussed the findings and recommendations with the patient. Results were also provided in writing at the conclusion of the visit. If applicable, a reminder letter will be sent to the patient regarding the next appointment. BI-RADS CATEGORY  5: Highly suggestive of malignancy. Electronically Signed   By: Fidela Salisbury M.D.   On: 06/18/2016 17:50   Mm Diag Breast Tomo Bilateral  Result Date: 06/18/2016 CLINICAL DATA:  Left breast upper outer quadrant area of palpable concern felt by the patient. EXAM: 2D DIGITAL DIAGNOSTIC BILATERAL MAMMOGRAM WITH CAD AND ADJUNCT TOMO ULTRASOUND LEFT BREAST COMPARISON:  None available. ACR Breast Density Category b: There are scattered areas of fibroglandular density. FINDINGS: There are no suspicious masses, areas of architectural distortion or microcalcifications in the right breast. There is a regional focal asymmetry in the left breast upper outer quadrant spanning 10.3 by 7.0 by 5.4 cm. Within it, there is an ill-defined  mass measuring 1.9 cm. Mammographic images were processed with CAD. On physical exam, there is a firm palpable mass in the left breast upper outer quadrant, middle depth, which measures approximately 5-6 cm. Targeted ultrasound is performed, showing left breast 2 o'clock 7 cm from the nipple hypoechoic irregular mass measuring 1.7 by 1.7 by 2.5  cm. Abnormal shadowing hypoechoic tissue is seen within the adjacent breast parenchyma, too subtle to be measured sonographically. There are two abnormal left lower axillary lymph nodes with cortical thickening of up to 1 cm. IMPRESSION: Highly suspicious left breast upper outer quadrant palpable mass, which corresponds to a large regional focal asymmetry. Sonographic evaluation demonstrates a 2.5 cm mass at 2 o'clock, which could be used as a target for core needle biopsy, however the extent of disease is suspected to be larger than what the sonographic measurements indicate. Two abnormal left axillary lymph nodes, probably malignant. RECOMMENDATION: Ultrasound-guided core needle biopsy of left breast 2 o'clock mass and 1 of the abnormal left axillary lymph nodes. I have discussed the findings and recommendations with the patient. Results were also provided in writing at the conclusion of the visit. If applicable, a reminder letter will be sent to the patient regarding the next appointment. BI-RADS CATEGORY  5: Highly suggestive of malignancy. Electronically Signed   By: Fidela Salisbury M.D.   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 This was found to be concordant by Dr. Abelardo Diesel. Pathology results were discussed with the patient by telephone. The patient reported doing well after the biopsies with tenderness at the sites. Post biopsy instructions and care were reviewed and questions were answered. The patient was encouraged to call [facility] for any additional concerns. The patient was referred to [facility] on July 01, 2016. Pathology results reported by Terie Purser,  RN on 06/22/2016. Electronically Signed   By: Abelardo Diesel M.D.   On: 06/22/2016 14:42   Result Date: 06/22/2016 CLINICAL DATA:  Left breast mass and abnormal left axillary lymph node for biopsy. EXAM: ULTRASOUND GUIDED LEFT BREAST CORE NEEDLE BIOPSY COMPARISON:  Previous exam(s). FINDINGS: I met with the patient and we discussed the procedure of ultrasound-guided biopsy, including benefits and alternatives. We discussed the high likelihood of a successful procedure. We discussed the risks of the procedure, including infection, bleeding, tissue injury, clip migration, and inadequate sampling. Informed written consent was given. The usual time-out protocol was performed immediately prior to the procedure. Lesion quadrant: Upper-outer quadrant Using sterile technique and 1% Lidocaine as local anesthetic, under direct ultrasound visualization, a 12 gauge spring-loaded device was used to perform biopsy of spicular hypoechoic mass at the left breast 2 o'clock using a lateral approach. At the conclusion of the procedure a ribbon tissue marker clip was deployed into the biopsy cavity. Follow up 2 view mammogram was performed and dictated separately. Lesion quadrant: Left axilla Using sterile technique and 1% Lidocaine as local anesthetic, under direct ultrasound visualization, a 14 gauge spring-loaded device was used to perform biopsy of abnormal left axillary lymph node using a lateral approach. At the conclusion of the procedure a HydroMARK tissue marker clip was deployed into the biopsy cavity. Follow up 2 view mammogram was performed and dictated  separately. IMPRESSION: Ultrasound guided biopsies of left breast and left axilla. No apparent complications. Electronically Signed: By: Sherian Rein M.D. On: 06/19/2016 14:57   Mm Clip Placement Left  Result Date: 06/19/2016 CLINICAL DATA:  Status post ultrasound-guided core biopsy of left breast mass and abnormal left axillary lymph node. EXAM: DIAGNOSTIC LEFT  MAMMOGRAM POST ULTRASOUND BIOPSY COMPARISON:  Previous exam(s). FINDINGS: Mammographic images were obtained following ultrasound guided biopsy of abnormal mass at left breast 2 o'clock and abnormal left axillary lymph node. Cc and lateral views of the left breast demonstrate ribbon biopsy clip in the mass of concern at left breast 2 o'clock. Mammogram also demonstrates a HydroMARK clip in the abnormal lymph node of concern in the left axilla. IMPRESSION: Post biopsy mammogram demonstrating biopsy clips in the masses of concern. Final Assessment: Post Procedure Mammograms for Marker Placement Electronically Signed   By: Sherian Rein M.D.   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 This was found to be concordant by Dr. Sherian Rein. Pathology results were discussed with the patient by telephone. The patient reported doing well after the biopsies with tenderness at the sites. Post biopsy instructions and care were reviewed and questions were answered. The patient was encouraged to call [facility] for any additional concerns. The patient was referred to [facility] on July 01, 2016. Pathology results reported by Rene Kocher, RN on 06/22/2016. Electronically Signed   By: Sherian Rein M.D.   On: 06/22/2016 14:42   Result Date: 06/22/2016 CLINICAL DATA:  Left breast mass and abnormal left axillary lymph node for biopsy. EXAM: ULTRASOUND GUIDED LEFT BREAST CORE NEEDLE BIOPSY COMPARISON:  Previous exam(s). FINDINGS: I met with the patient and we discussed the procedure of ultrasound-guided biopsy, including benefits and alternatives. We discussed the high  likelihood of a successful procedure. We discussed the risks of the procedure, including infection, bleeding, tissue injury, clip migration, and inadequate sampling. Informed written consent was given. The usual time-out protocol was performed immediately prior to the procedure. Lesion quadrant: Upper-outer quadrant Using sterile technique and 1% Lidocaine as local anesthetic, under direct ultrasound visualization, a 12 gauge spring-loaded device was used to perform biopsy of spicular hypoechoic mass at the left breast 2 o'clock using a lateral approach. At the conclusion of the procedure a ribbon tissue marker clip was deployed into the biopsy cavity. Follow up 2 view mammogram was performed and dictated separately. Lesion quadrant: Left axilla Using sterile technique and 1% Lidocaine as local anesthetic, under direct ultrasound visualization, a 14 gauge spring-loaded device was used to perform biopsy of abnormal left axillary lymph node using a lateral approach. At the conclusion of the procedure a HydroMARK tissue marker clip was deployed into the biopsy cavity. Follow up 2 view mammogram was performed and dictated separately. IMPRESSION: Ultrasound guided biopsies of left breast and left axilla. No apparent complications. Electronically Signed: By: Sherian Rein M.D. On: 06/19/2016 14:57    Labs:  CBC:  Recent Labs  05/16/16 1709 07/01/16 1135 07/14/16 0954  WBC 6.6 5.9 4.9  HGB 12.1 12.3 12.4  HCT 35.4* 37.3 36.9  PLT 219 188 209    COAGS:  Recent  Labs  07/14/16 0954  INR 1.02  APTT 27    BMP:  Recent Labs  05/16/16 1709 06/19/16 0858 07/01/16 1135 07/14/16 0954  NA 137 141 140 138  K 3.6 3.6 3.8 3.8  CL 104 98  --  104  CO2 _0 GLUCOSE 96 116* 104 109*  BUN 8 15 8.8 11  CALCIUM 8.8* 9.6 9.1 8.9  CREATININE 0.63 0.72 0.8 0.64  GFRNONAA >60 101  --  >60  GFRAA >60 116  --  >60    LIVER FUNCTION TESTS:  Recent Labs  06/19/16 0858 07/01/16 1135  BILITOT  0.9 0.64  AST 18 15  ALT 14 14  ALKPHOS 101 85  PROT 7.6 7.0  ALBUMIN 4.5 3.4*    TUMOR MARKERS: No results for input(s): AFPTM, CEA, CA199, CHROMGRNA in the last 8760 hours.  Assessment and Plan: Patient with history of recently diagnosed left breast carcinoma; presents today for Port-A-Cath placement for chemotherapy.Risks and benefits discussed with the patient/family including, but not limited to bleeding, infection, pneumothorax, or fibrin sheath development and need for additional procedures. All of the patient's questions were answered, patient is agreeable to proceed. Consent signed and in chart.     Thank you for this interesting consult.  I greatly enjoyed meeting Dalisha Furia and look forward to participating in their care.  A copy of this report was sent to the requesting provider on this date.  Electronically Signed: D. Rowe Robert, PA-C 07/14/2016, 11:19 AM   I spent a total of  25 minutes   in face to face in clinical consultation, greater than 50% of which was counseling/coordinating care for Port-A-Cath placement

## 2016-07-15 ENCOUNTER — Encounter: Payer: Self-pay | Admitting: Oncology

## 2016-07-15 NOTE — Progress Notes (Signed)
Patient called to advise she has been approved for [facility] and just wanted to make me aware. Called and left her a voicemail to confirm I received the message.

## 2016-07-16 ENCOUNTER — Encounter (HOSPITAL_COMMUNITY): Payer: Self-pay | Admitting: *Deleted

## 2016-07-16 NOTE — Progress Notes (Signed)
Patient is approved for [employer]. Medicaid ID # [id number] P. Active dates 06/05/16-06/04/17.

## 2016-07-17 ENCOUNTER — Ambulatory Visit (HOSPITAL_BASED_OUTPATIENT_CLINIC_OR_DEPARTMENT_OTHER): Payer: Medicaid Other | Admitting: Oncology

## 2016-07-17 ENCOUNTER — Ambulatory Visit: Payer: Medicaid Other

## 2016-07-17 ENCOUNTER — Encounter: Payer: Self-pay | Admitting: Oncology

## 2016-07-17 VITALS — BP 129/72 | HR 72 | Temp 98.0°F | Resp 16 | Ht 67.5 in | Wt 257.0 lb

## 2016-07-17 DIAGNOSIS — C50412 Malignant neoplasm of upper-outer quadrant of left female breast: Secondary | ICD-10-CM | POA: Diagnosis present

## 2016-07-17 DIAGNOSIS — Z171 Estrogen receptor negative status [ER-]: Secondary | ICD-10-CM | POA: Diagnosis not present

## 2016-07-17 MED ORDER — LIDOCAINE-PRILOCAINE 2.5-2.5 % EX CREA: TOPICAL_CREAM | CUTANEOUS | 3 refills | Status: DC

## 2016-07-17 MED ORDER — DEXAMETHASONE 4 MG PO TABS: ORAL_TABLET | ORAL | 1 refills | Status: DC

## 2016-07-17 MED ORDER — LORATADINE 10 MG PO TABS: 10.0000 mg | ORAL_TABLET | Freq: Every day | ORAL | 1 refills | Status: DC

## 2016-07-17 MED ORDER — PROCHLORPERAZINE MALEATE 10 MG PO TABS: 10.0000 mg | ORAL_TABLET | Freq: Four times a day (QID) | ORAL | 1 refills | Status: DC | PRN

## 2016-07-17 MED ORDER — LORAZEPAM 0.5 MG PO TABS: 0.5000 mg | ORAL_TABLET | Freq: Every evening | ORAL | 0 refills | Status: DC | PRN

## 2016-07-17 MED FILL — PROCHLORPERAZINE 10 MG TAB: 10 | 7 days supply | Qty: 30 | Fill #0

## 2016-07-17 MED FILL — traZODone HCL 50 MG TABS: 50 | 30 days supply | Qty: 30 | Fill #0

## 2016-07-17 MED FILL — DEXAMETHASONE 4 MG TABLET: 4 | 7 days supply | Qty: 30 | Fill #0

## 2016-07-17 MED FILL — LORazepam 0.5 MG TABS: 0.5 | 30 days supply | Qty: 30 | Fill #0

## 2016-07-17 MED FILL — HYDROCHLOROTHIAZIDE 25 MG T: 25 | 30 days supply | Qty: 15 | Fill #0

## 2016-07-17 MED FILL — LIDOCAINE-PRILOCAINE CREAM: 2.5-2.5 | 10 days supply | Qty: 30 | Fill #0

## 2016-07-17 NOTE — Progress Notes (Signed)
Patient came in for our appointment to bring proof of income for the [employer]. Patient approved for one-time $1000 [employer]. Patient given a copy of approval as well as the expense sheet and outpatient pharmacy information. Gave patient a referral for cancer care for additional transportation assistance as well as ACS. Advised patient she may bring ACS to be faxed if she wishes. Patient has my card for any additional financial questions or concerns.

## 2016-07-17 NOTE — Progress Notes (Signed)
[place]  Telephone:(336) [phone] Fax:(336) [phone]     ID: [name] DOB: 1970/09/06  MR#: [id number]  DXA#:[id number]  Patient Care Team: [name], MD as PCP - General (Internal Medicine) [name], MD as Consulting Physician (General Surgery) Zanyia Silbaugh, [name], MD as Consulting Physician (Oncology) [name], MD as Attending Physician (Radiation Oncology) Chauncey Cruel, MD OTHER MD:  CHIEF COMPLAINT: Triple negative breast cancer  CURRENT TREATMENT: Neoadjuvant chemotherapy  INTERVAL HISTORY: [name] returns today for follow-up and treatment of her triple negative breast cancer accompanied by her husband. Since her last visit here she had an echocardiogram, on 07/10/2016, which showed an ejection fraction in the 60-65% range. She had a right IJ Port-A-Cath placed by interventional radiology 07/14/2016.  On 07/06/2016 she had a CT scan of the chest and a bone scan which showed no evidence of metastatic disease. On the same day she had bilateral breast MRI with and without contrast. This showed the mass in the upper outer left breast to measure 2.5 cm, but there was extensive non-masslike enhancement throughout the left breast extending at least over 7 cm. There were 3 and large morphologically abnormal lymph nodes in the left axilla.   She is now ready to start chemotherapy, with day 1 cycle 1 scheduled for 07/20/2016.  She is here today to review her supportive medications.  REVIEW OF SYSTEMS: She did well with port placement, with no unusual pain, bleeding or other complications. She had a harder time with a breast MRI. Xanax was only partially successful at helping her get through it. She is very anxious and somewhat depressed. She is not exercising regularly. She feels a little bit of "pulling" in her left nipple area, which she feels is inverting. A detailed review of systems today was otherwise stable.   BREAST CANCER  HISTORY: From the original intake note:  The patient was seen in the emergency room 05/16/2016 with nonspecific complaints but also noting that she had a tender lump in her lateral left breast which she said she had noted the day before. Exam by the emergency room physician confirmed a 3 cm soft left lateral breast mass without overlying erythema or nipple changes. This was felt to be most consistent with fibrocystic change but the patient was referred back to her primary care physician for further evaluation. She saw the physician assistant in May 18 and then Dr. Wynetta Emery on 06/12/2016 who scheduled a bilateral diagnostic mammography with tomography and left breast ultrasonography at the [facility] 06/18/2016. This found the breast density to be category B. In the left breast upper outer quadrant there was an area of asymmetry measuring up to 10.3 cm. On exam this was firm and palpable and on ultrasound there was an irregular mass measuring 2.5 cm with some subtle changes in the surrounding tissue. In the left lower axilla there were 2 suspicious-looking lymph nodes.  Biopsy of the left breast upper outer quadrant and one of the suspicious lymph nodes 06/19/2016 showed ([id number]) both to be involved by invasive ductal carcinoma, grade 3, estrogen and progesterone receptor negative, HER-2 not amplified with a signals ratio being 1.53-1.74 and the number per cell 2.35-3.05. The MIB-1 was 90%.  The patient's subsequent history is as detailed below.   PAST MEDICAL HISTORY: Past Medical History:  Diagnosis Date  . Malignant neoplasm of upper-outer quadrant of left breast in female, estrogen receptor negative ([place]) 06/23/2016    PAST SURGICAL HISTORY: Past Surgical History:  Procedure Laterality  Date  . ANKLE SURGERY    . IR FLUORO GUIDE PORT INSERTION RIGHT  07/14/2016  . IR US GUIDE VASC ACCESS RIGHT  07/14/2016  . TUBAL LIGATION      FAMILY HISTORY Family History  Problem Relation Age of  Onset  . Diabetes Mother   . Hypertension Mother   . Arthritis Mother   . Colon cancer Father   . Diabetes Father   . Heart disease Father   . Hypertension Father   . Breast cancer Maternal Grandmother   . Breast cancer Maternal Aunt   . Cervical cancer Maternal Aunt   . Lung cancer Maternal Aunt   The patient's father died at age 8 from colon cancer which she never had treated. The patient's mother is living, 39 years old as of June 2018. The patient has one brother, one sister. On the mother's side there is a history of breast cancer in the grandmother, age 25, and then 1 aunt with breast cancer age 29, and another with lung cancer age 8. There is no history of ovarian cancer in the family.  GYNECOLOGIC HISTORY:  Patient's last menstrual period was 07/06/2016. Menarche age 30, first live birth age 65. She is GX P4. Her periods have never been regular. She had a period in June 2018 but not for 4 months before that. She took birth control for some years remotely with no complications. She is status post bilateral tubal ligation.  SOCIAL HISTORY:  The patient's husband owns and runs a food truck area did the serve mostly [place] food. 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 Her husband Anacarolina Evelyn (goes by "[name]") is originally from care of. The patient's children from her first marriage are [name] 47 years old (all ages as of June 2018) currently in the [employer], and [name], who is studying psychology at [employer]. Together with [name] the patient's children are [name] and hila, aged 77 and 23.     ADVANCED DIRECTIVES: Not in place   HEALTH MAINTENANCE: Social History  Substance Use Topics  . Smoking status: Never Smoker  . Smokeless tobacco: Never Used  . Alcohol use No     Colonoscopy:Never   BTD:VVOH 2018  Bone density:Never   No Known Allergies  Current Outpatient  Prescriptions  Medication Sig Dispense Refill  . acetaminophen (TYLENOL) 500 MG tablet Take 1,000 mg by mouth every 6 (six) hours as needed. For fever/pain    . dexamethasone (DECADRON) 4 MG tablet Take 2 tablets by mouth once a day on the day after chemotherapy and then take 2 tablets two times a day for 2 days. Take with food. 30 tablet 1  . fluticasone (FLONASE) 50 MCG/ACT nasal spray Place 2 sprays into the nose daily. 16 g 0  . hydrochlorothiazide (HYDRODIURIL) 25 MG tablet Take 0.5 tablets (12.5 mg total) by mouth daily. 45 tablet 3  . lidocaine-prilocaine (EMLA) cream Apply to affected area once 30 g 3  . loratadine (CLARITIN) 10 MG tablet Take 1 tablet (10 mg total) by mouth daily. 90 tablet 1  . LORazepam (ATIVAN) 0.5 MG tablet Take 1 tablet (0.5 mg total) by mouth at bedtime as needed (Nausea or vomiting). 30 tablet 0  . prochlorperazine (COMPAZINE) 10 MG tablet Take 1 tablet (10 mg total) by mouth every 6 (six) hours as needed (Nausea or vomiting). 30 tablet 1  . traZODone (DESYREL) 50 MG tablet Take  0.5-1 tablets (25-50 mg total) by mouth at bedtime as needed for sleep. 30 tablet 3   No current facility-administered medications for this visit.     OBJECTIVE: Middle-aged white woman Who appears stated age  60:   07/17/16 1405  BP: 129/72  Pulse: 72  Resp: 16  Temp: 98 F (36.7 C)     Body mass index is 39.66 kg/m.   Filed Weights   07/17/16 1405  Weight: 257 lb (116.6 kg)       ECOG FS:1 - Symptomatic but completely ambulatory  Sclerae unicteric, EOMs intact Oropharynx clear and moist No cervical or supraclavicular adenopathy Lungs no rales or rhonchi Heart regular rate and rhythm Abd soft, nontender, positive bowel sounds MSK no focal spinal tenderness, no upper extremity lymphedema Neuro: nonfocal, well oriented, appropriate affect Breasts: The right breast is benign. The mass in the left breast, lateral to the nipple, measures about 3-4 cm. It is movable,  and there is no overlying erythema. It slightly distorts the breast contour when she is sitting or lying. There is very minimal tethering of the left nipple. Both axillae are benign.  LAB RESULTS:  CMP     Component Value Date/Time   NA 138 07/14/2016 0954   NA 140 07/01/2016 1135   K 3.8 07/14/2016 0954   K 3.8 07/01/2016 1135   CL 104 07/14/2016 0954   CO2 27 07/14/2016 0954   CO2 27 07/01/2016 1135   GLUCOSE 109 (H) 07/14/2016 0954   GLUCOSE 104 07/01/2016 1135   BUN 11 07/14/2016 0954   BUN 8.8 07/01/2016 1135   CREATININE 0.64 07/14/2016 0954   CREATININE 0.8 07/01/2016 1135   CALCIUM 8.9 07/14/2016 0954   CALCIUM 9.1 07/01/2016 1135   PROT 7.0 07/01/2016 1135   ALBUMIN 3.4 (L) 07/01/2016 1135   AST 15 07/01/2016 1135   ALT 14 07/01/2016 1135   ALKPHOS 85 07/01/2016 1135   BILITOT 0.64 07/01/2016 1135   GFRNONAA >60 07/14/2016 0954   GFRAA >60 07/14/2016 0954    No results found for: [name], A1GS, A2GS, BETS, BETA2SER, GAMS, MSPIKE, SPEI  No results found for: Nils Pyle, [facility]  Lab Results  Component Value Date   WBC 4.9 07/14/2016   NEUTROABS 4.0 07/01/2016   HGB 12.4 07/14/2016   HCT 36.9 07/14/2016   MCV 83.5 07/14/2016   PLT 209 07/14/2016      Chemistry      Component Value Date/Time   NA 138 07/14/2016 0954   NA 140 07/01/2016 1135   K 3.8 07/14/2016 0954   K 3.8 07/01/2016 1135   CL 104 07/14/2016 0954   CO2 27 07/14/2016 0954   CO2 27 07/01/2016 1135   BUN 11 07/14/2016 0954   BUN 8.8 07/01/2016 1135   CREATININE 0.64 07/14/2016 0954   CREATININE 0.8 07/01/2016 1135      Component Value Date/Time   CALCIUM 8.9 07/14/2016 0954   CALCIUM 9.1 07/01/2016 1135   ALKPHOS 85 07/01/2016 1135   AST 15 07/01/2016 1135   ALT 14 07/01/2016 1135   BILITOT 0.64 07/01/2016 1135       No results found for: LABCA2  No components found for: [id number]   Recent Labs Lab 07/14/16 0954  INR 1.02     Urinalysis    Component Value Date/Time   COLORURINE AMBER (A) 05/16/2016 1709   APPEARANCEUR CLOUDY (A) 05/16/2016 1709   LABSPEC 1.023 05/16/2016 1709   PHURINE 5.5 05/16/2016 1709   GLUCOSEU NEGATIVE  05/16/2016 1709   HGBUR NEGATIVE 05/16/2016 1709   BILIRUBINUR NEGATIVE 05/16/2016 1709   KETONESUR NEGATIVE 05/16/2016 1709   PROTEINUR NEGATIVE 05/16/2016 1709   UROBILINOGEN 0.2 01/04/2010 1911   NITRITE NEGATIVE 05/16/2016 1709   LEUKOCYTESUR NEGATIVE 05/16/2016 1709     STUDIES: Ct Chest W Contrast  Result Date: 07/06/2016 CLINICAL DATA:  Left breast cancer. EXAM: CT CHEST WITH CONTRAST TECHNIQUE: Multidetector CT imaging of the chest was performed during intravenous contrast administration. CONTRAST:  75 cc Isovue-300. COMPARISON:  03/02/2005. FINDINGS: Cardiovascular: Vascular structures are unremarkable. Heart is at the upper limits of normal in size. No pericardial effusion. Mediastinum/Nodes: Masslike soft tissue is seen in the lateral aspect of the left breast, measuring approximately 3.4 cm. There are 2 enlarged lymph nodes in the left axilla, measuring up to 1.6 cm in short axis. No mediastinal, hilar, right axillary or internal mammary adenopathy. Esophagus is grossly unremarkable. Lungs/Pleura: There are scattered subpleural lymph nodes measuring up to 4 mm, unchanged. No pleural fluid. Airway is unremarkable. Upper Abdomen: Visualized portion of the liver is unremarkable. A stone is partially imaged in the gallbladder. Visualized portions of the adrenal glands, kidneys, spleen, pancreas, stomach and bowel are grossly unremarkable. No upper abdominal adenopathy. Musculoskeletal: No worrisome lytic or sclerotic lesions. IMPRESSION: 1. Left breast mass with left axillary adenopathy. No additional evidence of metastatic disease. 2. Cholelithiasis. Electronically Signed   By: Lorin Picket M.D.   On: 07/06/2016 08:57   Nm Bone Scan Whole Body  Result Date: 07/06/2016 CLINICAL  DATA:  Left-sided breast carcinoma EXAM: NUCLEAR MEDICINE WHOLE BODY BONE SCAN TECHNIQUE: Whole body anterior and posterior images were obtained approximately 3 hours after intravenous injection of radiopharmaceutical. RADIOPHARMACEUTICALS:  19.2 mCi Technetium-65mMDP IV COMPARISON:  Chest CT July 06, 2016 FINDINGS: There is a punctate focus of increased radiotracer uptake at the level of the pedicle on the right at T11. There is modest increased uptake in each knee and shoulder, likely of arthropathic etiology. Elsewhere, the distribution of uptake of radiotracer is within normal limits. Kidneys are noted in the flank positions bilaterally. IMPRESSION: Small focus of increased uptake in the region of the pedicle of T11 on the right. CT through this area shows equivocal arthropathy in this area. Areas of increased uptake in the shoulders and knees is almost certainly of arthropathic etiology. There are no findings which are felt to be indicative of bony metastatic disease. Electronically Signed   By: WLowella GripIII M.D.   On: 07/06/2016 12:00   Mr Breast Bilateral W Wo Contrast  Result Date: 07/06/2016 CLINICAL DATA:  Newly diagnosed left breast cancer with metastatic lymph node. LABS:  GFR greater than 60 EXAM: BILATERAL BREAST MRI WITH AND WITHOUT CONTRAST TECHNIQUE: Multiplanar, multisequence MR images of both breasts were obtained prior to and following the intravenous administration of 20 ml of MultiHance. THREE-DIMENSIONAL MR IMAGE RENDERING ON INDEPENDENT WORKSTATION: Three-dimensional MR images were rendered by post-processing of the original MR data on an independent workstation. The three-dimensional MR images were interpreted, and findings are reported in the following complete MRI report for this study. Three dimensional images were evaluated at the independent DynaCad workstation COMPARISON:  Previous exam(s). FINDINGS: Breast composition: b. Scattered fibroglandular tissue. Background  parenchymal enhancement: Moderate. Right breast: No mass or abnormal enhancement. Left breast: Irregular enhancing mass within the upper-outer quadrant of the left breast, at middle depth, measuring 2.5 x 2 x 1.6 cm (transverse by AP by craniocaudal dimensions) with biopsy clip artifact, consistent  with biopsy proven carcinoma. Additional extensive non-mass enhancement extending anterior, superior and inferior from the biopsy-proven carcinoma. This extensive non-mass enhancement within the outer left breast extends from posterior to anterior/subareolar depth, measuring at least 10 cm AP dimension and 7 cm craniocaudal dimension, almost certainly indicating multifocal and multicentric disease. Lymph nodes: 3 enlarged/morphologically abnormal lymph nodes are identified in the left axilla, 1 of which corresponds to the biopsy-proven lymph node metastasis. No enlarged or morphologically abnormal lymph nodes are identified in the right axilla or within the internal mammary chain regions. Ancillary findings:  None. IMPRESSION: 1. Biopsy-proven carcinoma within the upper-outer quadrant of the left breast, at middle depth, measuring 2.5 cm, with associated biopsy clip artifact. 2. Extensive highly suspicious non-mass enhancement within the outer left breast, involving the upper outer quadrant and lower outer quadrant, extending from posterior depth to anterior/subareolar depth, measuring over 10 cm AP extent and 7 cm craniocaudal dimension, almost certainly indicating multicentric disease and corresponding to the extent of asymmetry seen on initial diagnostic mammogram of 06/18/2016. 3. Three enlarged/morphologically abnormal lymph nodes in the left axilla, 1 of which corresponds to the biopsy-proven lymph node metastasis. No additional lymphadenopathy in the right axilla or internal mammary chain regions. 4. No evidence of malignancy within the right breast. RECOMMENDATION: 1. Per current treatment plan for patient's known  left breast cancer. 2. If breast conservation therapy for the left breast was considered, MRI-guided biopsies would be needed for the most anterior and superior aspects of the extensive non-mass enhancement in the outer left breast to confirm extent of the presumed multicentric disease. BI-RADS CATEGORY  6: Known biopsy-proven malignancy. Electronically Signed   By: Franki Cabot M.D.   On: 07/06/2016 11:53   Ir US Guide Vasc Access Right  Result Date: 07/14/2016 CLINICAL DATA:  Left breast carcinoma common the durable venous access for chemotherapy regimen. EXAM: TUNNELED PORT CATHETER PLACEMENT WITH ULTRASOUND AND FLUOROSCOPIC GUIDANCE FLUOROSCOPY TIME:  0.1 minute, 40uGym2 DAP ANESTHESIA/SEDATION: Intravenous Fentanyl and Versed were administered as conscious sedation during continuous monitoring of the patient's level of consciousness and physiological / cardiorespiratory status by the radiology RN, with a total moderate sedation time of 22 minutes. TECHNIQUE: The procedure, risks, benefits, and alternatives were explained to the patient. Questions regarding the procedure were encouraged and answered. The patient understands and consents to the procedure. As antibiotic prophylaxis, cefazolin 2 g was ordered pre-procedure and administered intravenously within one hour of incision. Patency of the right IJ vein was confirmed with ultrasound with image documentation. An appropriate skin site was determined. Skin site was marked. Region was prepped using maximum barrier technique including cap and mask, sterile gown, sterile gloves, large sterile sheet, and Chlorhexidine as cutaneous antisepsis. The region was infiltrated locally with 1% lidocaine. Under real-time ultrasound guidance, the right IJ vein was accessed with a 21 gauge micropuncture needle; the needle tip within the vein was confirmed with ultrasound image documentation. Needle was exchanged over a 018 guidewire for transitional dilator which allowed  passage of the [facility] wire into the IVC. Over this, the transitional dilator was exchanged for a 5 [place] MPA catheter. A small incision was made on the right anterior chest wall and a subcutaneous pocket fashioned. The power-injectable port was positioned and its catheter tunneled to the right IJ dermatotomy site. The MPA catheter was exchanged over an Amplatz wire for a peel-away sheath, through which the port catheter, which had been trimmed to the appropriate length, was advanced and positioned under fluoroscopy with its  tip at the cavoatrial junction. Spot chest radiograph confirms good catheter position and no pneumothorax. The pocket was closed with deep interrupted and subcuticular continuous 3-0 Monocryl sutures. The port was flushed per protocol. The incisions were covered with Dermabond then covered with a sterile dressing. COMPLICATIONS: COMPLICATIONS None immediate IMPRESSION: Technically successful right IJ power-injectable port catheter placement. Ready for routine use. Electronically Signed   By: Lucrezia Europe M.D.   On: 07/14/2016 12:43   [place] Axillary Node Core Biopsy Left  Addendum Date: 06/22/2016   ADDENDUM REPORT: 06/22/2016 14:42 ADDENDUM: Pathology revealed GRADE III INVASIVE DUCTAL CARCINOMA of the Left breast, 2:00 o'clock. 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 This was found to be concordant by Dr. Abelardo Diesel. Pathology results were discussed with the patient by telephone. The patient reported doing well after the biopsies with tenderness at the sites. Post biopsy instructions and care were reviewed and questions were answered. The patient was encouraged to call [facility] for any additional concerns. The patient was referred to [facility] on July 01, 2016. Pathology results reported by Terie Purser, RN on 06/22/2016. Electronically Signed   By: Abelardo Diesel M.D.   On:  06/22/2016 14:42   Result Date: 06/22/2016 CLINICAL DATA:  Left breast mass and abnormal left axillary lymph node for biopsy. EXAM: ULTRASOUND GUIDED LEFT BREAST CORE NEEDLE BIOPSY COMPARISON:  Previous exam(s). FINDINGS: I met with the patient and we discussed the procedure of ultrasound-guided biopsy, including benefits and alternatives. We discussed the high likelihood of a successful procedure. We discussed the risks of the procedure, including infection, bleeding, tissue injury, clip migration, and inadequate sampling. Informed written consent was given. The usual time-out protocol was performed immediately prior to the procedure. Lesion quadrant: Upper-outer quadrant Using sterile technique and 1% Lidocaine as local anesthetic, under direct ultrasound visualization, a 12 gauge spring-loaded device was used to perform biopsy of spicular hypoechoic mass at the left breast 2 o'clock using a lateral approach. At the conclusion of the procedure a ribbon tissue marker clip was deployed into the biopsy cavity. Follow up 2 view mammogram was performed and dictated separately. Lesion quadrant: Left axilla Using sterile technique and 1% Lidocaine as local anesthetic, under direct ultrasound visualization, a 14 gauge spring-loaded device was used to perform biopsy of abnormal left axillary lymph node using a lateral approach. At the conclusion of the procedure a HydroMARK tissue marker clip was deployed into the biopsy cavity. Follow up 2 view mammogram was performed and dictated separately. IMPRESSION: Ultrasound guided biopsies of left breast and left axilla. No apparent complications. Electronically Signed: By: Abelardo Diesel M.D. On: 06/19/2016 14:57   Mm Clip Placement Left  Result Date: 06/19/2016 CLINICAL DATA:  Status post ultrasound-guided core biopsy of left breast mass and abnormal left axillary lymph node. EXAM: DIAGNOSTIC LEFT MAMMOGRAM POST ULTRASOUND BIOPSY COMPARISON:  Previous exam(s). FINDINGS:  Mammographic images were obtained following ultrasound guided biopsy of abnormal mass at left breast 2 o'clock and abnormal left axillary lymph node. Cc and lateral views of the left breast demonstrate ribbon biopsy clip in the mass of concern at left breast 2 o'clock. Mammogram also demonstrates a HydroMARK clip in the abnormal lymph node of concern in the left axilla. IMPRESSION: Post biopsy mammogram demonstrating biopsy clips in the masses of concern. Final Assessment: Post Procedure Mammograms for Marker Placement Electronically Signed   By: Mallie Darting.D.  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 This was found to be concordant by Dr. Abelardo Diesel. Pathology results were discussed with the patient by telephone. The patient reported doing well after the biopsies with tenderness at the sites. Post biopsy instructions and care were reviewed and questions were answered. The patient was encouraged to call [facility] for any additional concerns. The patient was referred to [facility] at [facility] on July 01, 2016. Pathology results reported by Terie Purser, RN on 06/22/2016. Electronically Signed   By: Abelardo Diesel M.D.   On: 06/22/2016 14:42   Result Date: 06/22/2016 CLINICAL DATA:  Left breast mass and abnormal left axillary lymph node for biopsy. EXAM: ULTRASOUND GUIDED LEFT BREAST CORE NEEDLE BIOPSY COMPARISON:  Previous exam(s). FINDINGS: I met with the patient and we discussed the procedure of ultrasound-guided biopsy, including benefits and alternatives. We discussed the high likelihood of a successful procedure. We discussed the risks of the procedure,  including infection, bleeding, tissue injury, clip migration, and inadequate sampling. Informed written consent was given. The usual time-out protocol was performed immediately prior to the procedure. Lesion quadrant: Upper-outer quadrant Using sterile technique and 1% Lidocaine as local anesthetic, under direct ultrasound visualization, a 12 gauge spring-loaded device was used to perform biopsy of spicular hypoechoic mass at the left breast 2 o'clock using a lateral approach. At the conclusion of the procedure a ribbon tissue marker clip was deployed into the biopsy cavity. Follow up 2 view mammogram was performed and dictated separately. Lesion quadrant: Left axilla Using sterile technique and 1% Lidocaine as local anesthetic, under direct ultrasound visualization, a 14 gauge spring-loaded device was used to perform biopsy of abnormal left axillary lymph node using a lateral approach. At the conclusion of the procedure a HydroMARK tissue marker clip was deployed into the biopsy cavity. Follow up 2 view mammogram was performed and dictated separately. IMPRESSION: Ultrasound guided biopsies of left breast and left axilla. No apparent complications. Electronically Signed: By: Abelardo Diesel M.D. On: 06/19/2016 14:57   Ir Fluoro Guide Port Insertion Right  Result Date: 07/14/2016 CLINICAL DATA:  Left breast carcinoma common the durable venous access for chemotherapy regimen. EXAM: TUNNELED PORT CATHETER PLACEMENT WITH ULTRASOUND AND FLUOROSCOPIC GUIDANCE FLUOROSCOPY TIME:  0.1 minute, 40uGym2 DAP ANESTHESIA/SEDATION: Intravenous Fentanyl and Versed were administered as conscious sedation during continuous monitoring of the patient's level of consciousness and physiological / cardiorespiratory status by the radiology RN, with a total moderate sedation time of 22 minutes. TECHNIQUE: The procedure, risks, benefits, and alternatives were explained to the patient. Questions regarding the procedure were encouraged and  answered. The patient understands and consents to the procedure. As antibiotic prophylaxis, cefazolin 2 g was ordered pre-procedure and administered intravenously within one hour of incision. Patency of the right IJ vein was confirmed with ultrasound with image documentation. An appropriate skin site was determined. Skin site was marked. Region was prepped using maximum barrier technique including cap and mask, sterile gown, sterile gloves, large sterile sheet, and Chlorhexidine as cutaneous antisepsis. The region was infiltrated locally with 1% lidocaine. Under real-time ultrasound guidance, the right IJ vein was accessed with a 21 gauge micropuncture needle; the needle tip  within the vein was confirmed with ultrasound image documentation. Needle was exchanged over a 018 guidewire for transitional dilator which allowed passage of the [facility] wire into the IVC. Over this, the transitional dilator was exchanged for a 5 [place] MPA catheter. A small incision was made on the right anterior chest wall and a subcutaneous pocket fashioned. The power-injectable port was positioned and its catheter tunneled to the right IJ dermatotomy site. The MPA catheter was exchanged over an Amplatz wire for a peel-away sheath, through which the port catheter, which had been trimmed to the appropriate length, was advanced and positioned under fluoroscopy with its tip at the cavoatrial junction. Spot chest radiograph confirms good catheter position and no pneumothorax. The pocket was closed with deep interrupted and subcuticular continuous 3-0 Monocryl sutures. The port was flushed per protocol. The incisions were covered with Dermabond then covered with a sterile dressing. COMPLICATIONS: COMPLICATIONS None immediate IMPRESSION: Technically successful right IJ power-injectable port catheter placement. Ready for routine use. Electronically Signed   By: Lucrezia Europe M.D.   On: 07/14/2016 12:43    ELIGIBLE FOR AVAILABLE RESEARCH PROTOCOL:  no   ASSESSMENT: 46 y.o. [place] woman status post left breast upper outer quadrant and left axillary lymph node biopsy 06/19/2016, both positive for a clinical T2-T3 N1, stage IIIB-C invasive ductal carcinoma, grade 3, triple negative, with an MIB-1 of 90%  (1) genetics testing pending  (2) neoadjuvant chemotherapy to consist of doxorubicin and cyclophosphamide in dose dense fashion 4 followed by paclitaxel weekly 12 given with carboplatin starting 07/20/2016  (3) definitive surgery to follow  (4) adjuvant radiation to follow surgery  PLAN: I spent approximately 30 minutes with Elmo and coronary with most of that time spent discussing her concerns. We reviewed images of the MRI so she can see the areas in question. I think she is going to need a mastectomy but we will of course repeat the MRI at the end of her adjuvant treatment.  We again reviewed the possible toxicities side effects and complications of her chemotherapy, which will be standard AC-T. She is scheduled to start Monday, 07/20/2016. I gave them a roadmap of how to take her supportive medications, reviewed that in detail with them, and placed all the prescriptions.  I think she will need significant support at least through the initial chemotherapy cycles. She will need to be seen at least the week after each cycle and possibly with each cycle as well.  I encouraged her to call us with any problems or concerns before the next visit.  Chauncey Cruel, MD   07/19/2016 10:34 AM Medical Oncology and Hematology [facility] [address] [place], [place] [address] Tel. [phone]    Fax. [phone]

## 2016-07-20 ENCOUNTER — Ambulatory Visit: Payer: Medicaid Other

## 2016-07-20 ENCOUNTER — Other Ambulatory Visit: Payer: Medicaid Other

## 2016-07-20 ENCOUNTER — Other Ambulatory Visit: Payer: Self-pay | Admitting: Adult Health

## 2016-07-20 ENCOUNTER — Ambulatory Visit (HOSPITAL_BASED_OUTPATIENT_CLINIC_OR_DEPARTMENT_OTHER): Payer: Medicaid Other

## 2016-07-20 ENCOUNTER — Encounter: Payer: Self-pay | Admitting: *Deleted

## 2016-07-20 ENCOUNTER — Ambulatory Visit: Payer: Medicaid Other | Admitting: Internal Medicine

## 2016-07-20 VITALS — BP 127/75 | HR 68 | Temp 98.4°F | Resp 17

## 2016-07-20 DIAGNOSIS — Z5111 Encounter for antineoplastic chemotherapy: Secondary | ICD-10-CM

## 2016-07-20 DIAGNOSIS — Z95828 Presence of other vascular implants and grafts: Secondary | ICD-10-CM

## 2016-07-20 DIAGNOSIS — C50412 Malignant neoplasm of upper-outer quadrant of left female breast: Secondary | ICD-10-CM | POA: Diagnosis not present

## 2016-07-20 DIAGNOSIS — Z171 Estrogen receptor negative status [ER-]: Principal | ICD-10-CM

## 2016-07-20 DIAGNOSIS — Z5189 Encounter for other specified aftercare: Secondary | ICD-10-CM | POA: Diagnosis not present

## 2016-07-20 LAB — RESEARCH LABS

## 2016-07-20 MED ORDER — SODIUM CHLORIDE 0.9 % IV SOLN
Freq: Once | INTRAVENOUS | Status: AC
  Administered 2016-07-20: 14:00:00 via INTRAVENOUS

## 2016-07-20 MED ORDER — DOXORUBICIN HCL CHEMO IV INJECTION 2 MG/ML
60.0000 mg/m2 | Freq: Once | INTRAVENOUS | Status: AC
  Administered 2016-07-20: 142 mg via INTRAVENOUS
  Filled 2016-07-20: qty 71

## 2016-07-20 MED ORDER — SODIUM CHLORIDE 0.9 % IV SOLN
600.0000 mg/m2 | Freq: Once | INTRAVENOUS | Status: AC
  Administered 2016-07-20: 1420 mg via INTRAVENOUS
  Filled 2016-07-20: qty 71

## 2016-07-20 MED ORDER — SODIUM CHLORIDE 0.9% FLUSH
10.0000 mL | INTRAVENOUS | Status: DC | PRN
Start: 2016-07-20 — End: 2016-07-20
  Administered 2016-07-20: 10 mL
  Filled 2016-07-20: qty 10

## 2016-07-20 MED ORDER — HEPARIN SOD (PORK) LOCK FLUSH 100 UNIT/ML IV SOLN
500.0000 [IU] | Freq: Once | INTRAVENOUS | Status: AC | PRN
  Administered 2016-07-20: 500 [IU]
  Filled 2016-07-20: qty 5

## 2016-07-20 MED ORDER — SODIUM CHLORIDE 0.9 % IV SOLN
Freq: Once | INTRAVENOUS | Status: AC
  Administered 2016-07-20: 14:00:00 via INTRAVENOUS
  Filled 2016-07-20: qty 5

## 2016-07-20 MED ORDER — SODIUM CHLORIDE 0.9% FLUSH
10.0000 mL | INTRAVENOUS | Status: DC | PRN
  Administered 2016-07-20: 10 mL via INTRAVENOUS
  Filled 2016-07-20: qty 10

## 2016-07-20 MED ORDER — PALONOSETRON HCL INJECTION 0.25 MG/5ML
INTRAVENOUS | Status: AC
  Filled 2016-07-20: qty 5

## 2016-07-20 MED ORDER — PEGFILGRASTIM 6 MG/0.6ML ~~LOC~~ PSKT
6.0000 mg | PREFILLED_SYRINGE | Freq: Once | SUBCUTANEOUS | Status: AC
  Administered 2016-07-20: 6 mg via SUBCUTANEOUS
  Filled 2016-07-20: qty 0.6

## 2016-07-20 MED ORDER — PALONOSETRON HCL INJECTION 0.25 MG/5ML
0.2500 mg | Freq: Once | INTRAVENOUS | Status: AC
  Administered 2016-07-20: 0.25 mg via INTRAVENOUS

## 2016-07-20 NOTE — Patient Instructions (Signed)
Implanted Port Home Guide An implanted port is a type of central line that is placed under the skin. Central lines are used to provide IV access when treatment or nutrition needs to be given through a person's veins. Implanted ports are used for long-term IV access. An implanted port may be placed because:  You need IV medicine that would be irritating to the small veins in your hands or arms.  You need long-term IV medicines, such as antibiotics.  You need IV nutrition for a long period.  You need frequent blood draws for lab tests.  You need dialysis.  Implanted ports are usually placed in the chest area, but they can also be placed in the upper arm, the abdomen, or the leg. An implanted port has two main parts:  Reservoir. The reservoir is round and will appear as a small, raised area under your skin. The reservoir is the part where a needle is inserted to give medicines or draw blood.  Catheter. The catheter is a thin, flexible tube that extends from the reservoir. The catheter is placed into a large vein. Medicine that is inserted into the reservoir goes into the catheter and then into the vein.  How will I care for my incision site? Do not get the incision site wet. Bathe or shower as directed by your health care provider. How is my port accessed? Special steps must be taken to access the port:  Before the port is accessed, a numbing cream can be placed on the skin. This helps numb the skin over the port site.  Your health care provider uses a sterile technique to access the port. ? Your health care provider must put on a mask and sterile gloves. ? The skin over your port is cleaned carefully with an antiseptic and allowed to dry. ? The port is gently pinched between sterile gloves, and a needle is inserted into the port.  Only "non-coring" port needles should be used to access the port. Once the port is accessed, a blood return should be checked. This helps ensure that the port  is in the vein and is not clogged.  If your port needs to remain accessed for a constant infusion, a clear (transparent) bandage will be placed over the needle site. The bandage and needle will need to be changed every week, or as directed by your health care provider.  Keep the bandage covering the needle clean and dry. Do not get it wet. Follow your health care provider's instructions on how to take a shower or bath while the port is accessed.  If your port does not need to stay accessed, no bandage is needed over the port.  What is flushing? Flushing helps keep the port from getting clogged. Follow your health care provider's instructions on how and when to flush the port. Ports are usually flushed with saline solution or a medicine called heparin. The need for flushing will depend on how the port is used.  If the port is used for intermittent medicines or blood draws, the port will need to be flushed: ? After medicines have been given. ? After blood has been drawn. ? As part of routine maintenance.  If a constant infusion is running, the port may not need to be flushed.  How long will my port stay implanted? The port can stay in for as long as your health care provider thinks it is needed. When it is time for the port to come out, surgery will be   done to remove it. The procedure is similar to the one performed when the port was put in. When should I seek immediate medical care? When you have an implanted port, you should seek immediate medical care if:  You notice a bad smell coming from the incision site.  You have swelling, redness, or drainage at the incision site.  You have more swelling or pain at the port site or the surrounding area.  You have a fever that is not controlled with medicine.  This information is not intended to replace advice given to you by your health care provider. Make sure you discuss any questions you have with your health care provider. Document  Released: 12/22/2004 Document Revised: 05/30/2015 Document Reviewed: 08/29/2012 Elsevier Interactive Patient Education  2017 Elsevier Inc.  

## 2016-07-20 NOTE — Progress Notes (Signed)
Patient reported sinus burning once Cytoxan infusion finished. Cytoxan infused over 30 minutes. Will notify pharmacy that infusion needs to be slower. Fluids infused afterwards for 10 minutes to decrease sinus headache/burning.

## 2016-07-20 NOTE — Patient Instructions (Signed)
[facility] Discharge Instructions for Patients Receiving Chemotherapy  Today you received the following chemotherapy agents: Adriamycin, Cytoxan    To help prevent nausea and vomiting after your treatment, we encourage you to take your nausea medication as prescribed.   If you develop nausea and vomiting that is not controlled by your nausea medication, call the clinic.   BELOW ARE SYMPTOMS THAT SHOULD BE REPORTED IMMEDIATELY:  *FEVER GREATER THAN 100.5 F  *CHILLS WITH OR WITHOUT FEVER  NAUSEA AND VOMITING THAT IS NOT CONTROLLED WITH YOUR NAUSEA MEDICATION  *UNUSUAL SHORTNESS OF BREATH  *UNUSUAL BRUISING OR BLEEDING  TENDERNESS IN MOUTH AND THROAT WITH OR WITHOUT PRESENCE OF ULCERS  *URINARY PROBLEMS  *BOWEL PROBLEMS  UNUSUAL RASH Items with * indicate a potential emergency and should be followed up as soon as possible.  Feel free to call the clinic you have any questions or concerns. The clinic phone number is [phone].  Please show the [place] at check-in to the Emergency Department and triage nurse.  Doxorubicin injection What is this medicine? DOXORUBICIN (dox oh ROO bi sin) is a chemotherapy drug. It is used to treat many kinds of cancer like leukemia, lymphoma, neuroblastoma, sarcoma, and Wilms' tumor. It is also used to treat bladder cancer, breast cancer, lung cancer, ovarian cancer, stomach cancer, and thyroid cancer. This medicine may be used for other purposes; ask your health care provider or pharmacist if you have questions. COMMON BRAND NAME(S): Adriamycin, Adriamycin PFS, Adriamycin RDF, Rubex What should I tell my health care provider before I take this medicine? They need to know if you have any of these conditions: -heart disease -history of low blood counts caused by a medicine -liver disease -recent or ongoing radiation therapy -an unusual or allergic reaction to doxorubicin, other chemotherapy agents, other medicines,  foods, dyes, or preservatives -pregnant or trying to get pregnant -breast-feeding How should I use this medicine? This drug is given as an infusion into a vein. It is administered in a hospital or clinic by a specially trained health care professional. If you have pain, swelling, burning or any unusual feeling around the site of your injection, tell your health care professional right away. Talk to your pediatrician regarding the use of this medicine in children. Special care may be needed. Overdosage: If you think you have taken too much of this medicine contact a poison control center or emergency room at once. NOTE: This medicine is only for you. Do not share this medicine with others. What if I miss a dose? It is important not to miss your dose. Call your doctor or health care professional if you are unable to keep an appointment. What may interact with this medicine? This medicine may interact with the following medications: -6-mercaptopurine -paclitaxel -phenytoin -St. John's Wort -trastuzumab -verapamil This list may not describe all possible interactions. Give your health care provider a list of all the medicines, herbs, non-prescription drugs, or dietary supplements you use. Also tell them if you smoke, drink alcohol, or use illegal drugs. Some items may interact with your medicine. What should I watch for while using this medicine? This drug may make you feel generally unwell. This is not uncommon, as chemotherapy can affect healthy cells as well as cancer cells. Report any side effects. Continue your course of treatment even though you feel ill unless your doctor tells you to stop. There is a maximum amount of this medicine you should receive throughout your life. The amount depends on  the medical condition being treated and your overall health. Your doctor will watch how much of this medicine you receive in your lifetime. Tell your doctor if you have taken this medicine before. You  may need blood work done while you are taking this medicine. Your urine may turn red for a few days after your dose. This is not blood. If your urine is dark or brown, call your doctor. In some cases, you may be given additional medicines to help with side effects. Follow all directions for their use. Call your doctor or health care professional for advice if you get a fever, chills or sore throat, or other symptoms of a cold or flu. Do not treat yourself. This drug decreases your body's ability to fight infections. Try to avoid being around people who are sick. This medicine may increase your risk to bruise or bleed. Call your doctor or health care professional if you notice any unusual bleeding. Talk to your doctor about your risk of cancer. You may be more at risk for certain types of cancers if you take this medicine. Do not become pregnant while taking this medicine or for 6 months after stopping it. Women should inform their doctor if they wish to become pregnant or think they might be pregnant. Men should not father a child while taking this medicine and for 6 months after stopping it. There is a potential for serious side effects to an unborn child. Talk to your health care professional or pharmacist for more information. Do not breast-feed an infant while taking this medicine. This medicine has caused ovarian failure in some women and reduced sperm counts in some men This medicine may interfere with the ability to have a child. Talk with your doctor or health care professional if you are concerned about your fertility. What side effects may I notice from receiving this medicine? Side effects that you should report to your doctor or health care professional as soon as possible: -allergic reactions like skin rash, itching or hives, swelling of the face, lips, or tongue -breathing problems -chest pain -fast or irregular heartbeat -low blood counts - this medicine may decrease the number of white  blood cells, red blood cells and platelets. You may be at increased risk for infections and bleeding. -pain, redness, or irritation at site where injected -signs of infection - fever or chills, cough, sore throat, pain or difficulty passing urine -signs of decreased platelets or bleeding - bruising, pinpoint red spots on the skin, black, tarry stools, blood in the urine -swelling of the ankles, feet, hands -tiredness -weakness Side effects that usually do not require medical attention (report to your doctor or health care professional if they continue or are bothersome): -diarrhea -hair loss -mouth sores -nail discoloration or damage -nausea -red colored urine -vomiting This list may not describe all possible side effects. Call your doctor for medical advice about side effects. You may report side effects to FDA at 1-800-FDA-1088. Where should I keep my medicine? This drug is given in a hospital or clinic and will not be stored at home. NOTE: This sheet is a summary. It may not cover all possible information. If you have questions about this medicine, talk to your doctor, pharmacist, or health care provider.  2018 Elsevier/Gold Standard (2015-02-18 11:28:51) Cyclophosphamide injection What is this medicine? CYCLOPHOSPHAMIDE (sye kloe FOSS fa mide) is a chemotherapy drug. It slows the growth of cancer cells. This medicine is used to treat many types of cancer like lymphoma, myeloma,  leukemia, breast cancer, and ovarian cancer, to name a few. This medicine may be used for other purposes; ask your health care provider or pharmacist if you have questions. COMMON BRAND NAME(S): Cytoxan, Neosar What should I tell my health care provider before I take this medicine? They need to know if you have any of these conditions: -blood disorders -history of other chemotherapy -infection -kidney disease -liver disease -recent or ongoing radiation therapy -tumors in the bone marrow -an unusual or  allergic reaction to cyclophosphamide, other chemotherapy, other medicines, foods, dyes, or preservatives -pregnant or trying to get pregnant -breast-feeding How should I use this medicine? This drug is usually given as an injection into a vein or muscle or by infusion into a vein. It is administered in a hospital or clinic by a specially trained health care professional. Talk to your pediatrician regarding the use of this medicine in children. Special care may be needed. Overdosage: If you think you have taken too much of this medicine contact a poison control center or emergency room at once. NOTE: This medicine is only for you. Do not share this medicine with others. What if I miss a dose? It is important not to miss your dose. Call your doctor or health care professional if you are unable to keep an appointment. What may interact with this medicine? This medicine may interact with the following medications: -amiodarone -amphotericin B -azathioprine -certain antiviral medicines for HIV or AIDS such as protease inhibitors (e.g., indinavir, ritonavir) and zidovudine -certain blood pressure medications such as benazepril, captopril, enalapril, fosinopril, lisinopril, moexipril, monopril, perindopril, quinapril, ramipril, trandolapril -certain cancer medications such as anthracyclines (e.g., daunorubicin, doxorubicin), busulfan, cytarabine, paclitaxel, pentostatin, tamoxifen, trastuzumab -certain diuretics such as chlorothiazide, chlorthalidone, hydrochlorothiazide, indapamide, metolazone -certain medicines that treat or prevent blood clots like warfarin -certain muscle relaxants such as succinylcholine -cyclosporine -etanercept -indomethacin -medicines to increase blood counts like filgrastim, pegfilgrastim, sargramostim -medicines used as general anesthesia -metronidazole -natalizumab This list may not describe all possible interactions. Give your health care provider a list of all the  medicines, herbs, non-prescription drugs, or dietary supplements you use. Also tell them if you smoke, drink alcohol, or use illegal drugs. Some items may interact with your medicine. What should I watch for while using this medicine? Visit your doctor for checks on your progress. This drug may make you feel generally unwell. This is not uncommon, as chemotherapy can affect healthy cells as well as cancer cells. Report any side effects. Continue your course of treatment even though you feel ill unless your doctor tells you to stop. Drink water or other fluids as directed. Urinate often, even at night. In some cases, you may be given additional medicines to help with side effects. Follow all directions for their use. Call your doctor or health care professional for advice if you get a fever, chills or sore throat, or other symptoms of a cold or flu. Do not treat yourself. This drug decreases your body's ability to fight infections. Try to avoid being around people who are sick. This medicine may increase your risk to bruise or bleed. Call your doctor or health care professional if you notice any unusual bleeding. Be careful brushing and flossing your teeth or using a toothpick because you may get an infection or bleed more easily. If you have any dental work done, tell your dentist you are receiving this medicine. You may get drowsy or dizzy. Do not drive, use machinery, or do anything that needs mental alertness until  you know how this medicine affects you. Do not become pregnant while taking this medicine or for 1 year after stopping it. Women should inform their doctor if they wish to become pregnant or think they might be pregnant. Men should not father a child while taking this medicine and for 4 months after stopping it. There is a potential for serious side effects to an unborn child. Talk to your health care professional or pharmacist for more information. Do not breast-feed an infant while taking  this medicine. This medicine may interfere with the ability to have a child. This medicine has caused ovarian failure in some women. This medicine has caused reduced sperm counts in some men. You should talk with your doctor or health care professional if you are concerned about your fertility. If you are going to have surgery, tell your doctor or health care professional that you have taken this medicine. What side effects may I notice from receiving this medicine? Side effects that you should report to your doctor or health care professional as soon as possible: -allergic reactions like skin rash, itching or hives, swelling of the face, lips, or tongue -low blood counts - this medicine may decrease the number of white blood cells, red blood cells and platelets. You may be at increased risk for infections and bleeding. -signs of infection - fever or chills, cough, sore throat, pain or difficulty passing urine -signs of decreased platelets or bleeding - bruising, pinpoint red spots on the skin, black, tarry stools, blood in the urine -signs of decreased red blood cells - unusually weak or tired, fainting spells, lightheadedness -breathing problems -dark urine -dizziness -palpitations -swelling of the ankles, feet, hands -trouble passing urine or change in the amount of urine -weight gain -yellowing of the eyes or skin Side effects that usually do not require medical attention (report to your doctor or health care professional if they continue or are bothersome): -changes in nail or skin color -hair loss -missed menstrual periods -mouth sores -nausea, vomiting This list may not describe all possible side effects. Call your doctor for medical advice about side effects. You may report side effects to FDA at 1-800-FDA-1088. Where should I keep my medicine? This drug is given in a hospital or clinic and will not be stored at home. NOTE: This sheet is a summary. It may not cover all possible  information. If you have questions about this medicine, talk to your doctor, pharmacist, or health care provider.  2018 Elsevier/Gold Standard (2011-11-06 16:22:58)

## 2016-07-21 ENCOUNTER — Ambulatory Visit: Payer: No Typology Code available for payment source | Admitting: Internal Medicine

## 2016-07-23 ENCOUNTER — Telehealth: Payer: Self-pay

## 2016-07-23 NOTE — Telephone Encounter (Signed)
Chemo f/u

## 2016-07-25 ENCOUNTER — Emergency Department (HOSPITAL_BASED_OUTPATIENT_CLINIC_OR_DEPARTMENT_OTHER)
Admission: EM | Admit: 2016-07-25 | Discharge: 2016-07-25 | Disposition: A | Discharge status: Home / Self Care | Payer: Medicaid Other | Attending: Emergency Medicine | Admitting: Emergency Medicine

## 2016-07-25 ENCOUNTER — Emergency Department (HOSPITAL_BASED_OUTPATIENT_CLINIC_OR_DEPARTMENT_OTHER): Payer: Medicaid Other

## 2016-07-25 ENCOUNTER — Encounter (HOSPITAL_BASED_OUTPATIENT_CLINIC_OR_DEPARTMENT_OTHER): Payer: Self-pay | Admitting: Emergency Medicine

## 2016-07-25 DIAGNOSIS — Z872 Personal history of diseases of the skin and subcutaneous tissue: Secondary | ICD-10-CM | POA: Insufficient documentation

## 2016-07-25 DIAGNOSIS — Z79899 Other long term (current) drug therapy: Secondary | ICD-10-CM | POA: Diagnosis not present

## 2016-07-25 DIAGNOSIS — Z853 Personal history of malignant neoplasm of breast: Secondary | ICD-10-CM | POA: Diagnosis not present

## 2016-07-25 DIAGNOSIS — I10 Essential (primary) hypertension: Secondary | ICD-10-CM | POA: Insufficient documentation

## 2016-07-25 DIAGNOSIS — N644 Mastodynia: Secondary | ICD-10-CM | POA: Diagnosis present

## 2016-07-25 LAB — CBC WITH DIFFERENTIAL/PLATELET
BASOS PCT: 0 %
Basophils Absolute: 0 10*3/uL (ref 0.0–0.1)
EOS ABS: 0.1 10*3/uL (ref 0.0–0.7)
EOS PCT: 3 %
HEMATOCRIT: 39.1 % (ref 36.0–46.0)
Hemoglobin: 13 g/dL (ref 12.0–15.0)
Lymphocytes Relative: 16 %
Lymphs Abs: 0.7 10*3/uL (ref 0.7–4.0)
MCH: 28.4 pg (ref 26.0–34.0)
MCHC: 33.2 g/dL (ref 30.0–36.0)
MCV: 85.4 fL (ref 78.0–100.0)
MONO ABS: 0 10*3/uL — AB (ref 0.1–1.0)
MONOS PCT: 1 %
Neutro Abs: 3.4 10*3/uL (ref 1.7–7.7)
Neutrophils Relative %: 80 %
PLATELETS: 171 10*3/uL (ref 150–400)
RBC: 4.58 MIL/uL (ref 3.87–5.11)
RDW: 12.8 % (ref 11.5–15.5)
WBC: 4.3 10*3/uL (ref 4.0–10.5)

## 2016-07-25 LAB — COMPREHENSIVE METABOLIC PANEL
ALBUMIN: 3.5 g/dL (ref 3.5–5.0)
ALT: 24 U/L (ref 14–54)
ANION GAP: 9 (ref 5–15)
AST: 19 U/L (ref 15–41)
Alkaline Phosphatase: 83 U/L (ref 38–126)
BILIRUBIN TOTAL: 0.7 mg/dL (ref 0.3–1.2)
BUN: 14 mg/dL (ref 6–20)
CO2: 28 mmol/L (ref 22–32)
Calcium: 8.1 mg/dL — ABNORMAL LOW (ref 8.9–10.3)
Chloride: 99 mmol/L — ABNORMAL LOW (ref 101–111)
Creatinine, Ser: 0.57 mg/dL (ref 0.44–1.00)
GFR calc Af Amer: >60 mL/min (ref >60)
GFR calc non Af Amer: >60 mL/min (ref >60)
GLUCOSE: 138 mg/dL — AB (ref 65–99)
POTASSIUM: 3 mmol/L — AB (ref 3.5–5.1)
SODIUM: 136 mmol/L (ref 135–145)
TOTAL PROTEIN: 7 g/dL (ref 6.5–8.1)

## 2016-07-25 LAB — I-STAT CG4 LACTIC ACID, ED: Lactic Acid, Venous: 1.29 mmol/L (ref 0.5–1.9)

## 2016-07-25 MED ORDER — ACETAMINOPHEN 325 MG PO TABS
650.0000 mg | ORAL_TABLET | Freq: Once | ORAL | Status: AC
  Administered 2016-07-25: 650 mg via ORAL
  Filled 2016-07-25: qty 2

## 2016-07-25 MED ORDER — IOPAMIDOL (ISOVUE-300) INJECTION 61%
100.0000 mL | Freq: Once | INTRAVENOUS | Status: AC | PRN
Start: 2016-07-25 — End: 2016-07-25
  Administered 2016-07-25: 100 mL via INTRAVENOUS

## 2016-07-25 NOTE — ED Triage Notes (Signed)
Patient reports that she was recently dx with Breast CA and started chemo on Monday. She reports that today her left breast is more swollen and warm to touch. She states that it feels heavier

## 2016-07-25 NOTE — ED Notes (Signed)
Alert, NAD, calm, interactive, resps e/u, speaking in clear complete sentences, no dyspnea noted, skin W&D, VSS, no changes, denies questions or needs. Out with family x2.

## 2016-07-25 NOTE — ED Notes (Signed)
EDPA into room to update pt/family. Denies questions or needs. VSS. No changes.

## 2016-07-25 NOTE — ED Notes (Signed)
Not in room, pt in CT 

## 2016-07-25 NOTE — ED Provider Notes (Signed)
[facility] DEPT [facility] Provider Note   CSN: [id number] Arrival date & time: 07/25/16  1708     History   Chief Complaint Chief Complaint  Patient presents with  . Breast Pain    HPI [name] is a 46 y.o. female.  The history is provided by the patient and medical records.    46 year old female with newly diagnosed left breast cancer in May 2018, presenting to the ED with increased left breast pain. Had [place] on 06/19/16 which confirmed invasive ductal carcinoma, stage III.  Patient underwent MRI on 07-06-16 which revealed increased mass in upper outer quadrant of left breast as well as 10 x 7 cm enhancement surrounding in the soft tissue of the breast. States she started chemotherapy on Monday 07/20/16.  States she tolerated it fairly well, minimal nausea/vomiting.  She did receive Neulesta infusion on 07/21/16.  States yesterday she started feeling that her left breast had a 10 pound weight inside". States her nipple has started to invert and her breast has felt very warm to the touch. She's not noticed any fever, has been checking regularly at home area no issues with the right breast. States pain is worse with movement or direct palpation. States her left breast also feels more firm to the touch. States she called the oncology office as she was not sure what to do and they encouraged her to be evaluated. States otherwise she's been feeling fairly well, continues to have some fatigue which is unchanged from prior.  She is followed by [name].  Past Medical History:  Diagnosis Date  . Malignant neoplasm of upper-outer quadrant of left breast in female, estrogen receptor negative ([place]) 06/23/2016    Patient Active Problem List   Diagnosis Date Noted  . Malignant neoplasm of upper-outer quadrant of left breast in female, estrogen receptor negative ([place]) 06/23/2016  . Left breast mass 06/12/2016  . Essential hypertension 06/12/2016  . Class 3 severe obesity due to excess calories  without serious comorbidity with body mass index (BMI) of 40.0 to 44.9 in adult ([place]) 06/12/2016  . Low vitamin D level 06/12/2016  . Irregular menses 06/12/2016    Past Surgical History:  Procedure Laterality Date  . ANKLE SURGERY    . IR FLUORO GUIDE PORT INSERTION RIGHT  07/14/2016  . IR US GUIDE VASC ACCESS RIGHT  07/14/2016  . TUBAL LIGATION      OB History    Gravida Para Term Preterm AB Living   6       2 4    SAB TAB Ectopic Multiple Live Births   2       4       Home Medications    Prior to Admission medications   Medication Sig Start Date End Date Taking? Authorizing Provider  acetaminophen (TYLENOL) 500 MG tablet Take 1,000 mg by mouth every 6 (six) hours as needed. For fever/pain    [provider]  dexamethasone (DECADRON) 4 MG tablet Take 2 tablets by mouth once a day on the day after chemotherapy and then take 2 tablets two times a day for 2 days. Take with food. 07/17/16   Magrinat, [name], MD  fluticasone (FLONASE) 50 MCG/ACT nasal spray Place 2 sprays into the nose daily. 01/27/11 01/27/12  Glendell Docker, NP  hydrochlorothiazide (HYDRODIURIL) 25 MG tablet Take 0.5 tablets (12.5 mg total) by mouth daily. 06/12/16   [name], MD  lidocaine-prilocaine (EMLA) cream Apply to affected area once 07/17/16   Magrinat, Sarajane Jews  C, MD  loratadine (CLARITIN) 10 MG tablet Take 1 tablet (10 mg total) by mouth daily. 07/17/16   Magrinat, [name], MD  LORazepam (ATIVAN) 0.5 MG tablet Take 1 tablet (0.5 mg total) by mouth at bedtime as needed (Nausea or vomiting). 07/17/16   Magrinat, [name], MD  prochlorperazine (COMPAZINE) 10 MG tablet Take 1 tablet (10 mg total) by mouth every 6 (six) hours as needed (Nausea or vomiting). 07/17/16   Magrinat, [name], MD  traZODone (DESYREL) 50 MG tablet Take 0.5-1 tablets (25-50 mg total) by mouth at bedtime as needed for sleep. 06/23/16   [name], MD    Family History Family History  Problem Relation Age of Onset    . Diabetes Mother   . Hypertension Mother   . Arthritis Mother   . Colon cancer Father   . Diabetes Father   . Heart disease Father   . Hypertension Father   . Breast cancer Maternal Grandmother   . Breast cancer Maternal Aunt   . Cervical cancer Maternal Aunt   . Lung cancer Maternal Aunt     Social History Social History  Substance Use Topics  . Smoking status: Never Smoker  . Smokeless tobacco: Never Used  . Alcohol use No     Allergies   Patient has no known allergies.   Review of Systems Review of Systems  Constitutional:       Left breast pain  All other systems reviewed and are negative.    Physical Exam Updated Vital Signs BP (!) 118/91 (BP Location: Right Arm)   Pulse 77   Temp 98.4 F (36.9 C) (Oral)   Resp 18   Ht 5' 7.5" (1.715 m)   Wt 116.6 kg (257 lb)   LMP 07/06/2016   SpO2 100%   BMI 39.66 kg/m   Physical Exam  Constitutional: She is oriented to person, place, and time. She appears well-developed and well-nourished.  HENT:  Head: Normocephalic and atraumatic.  Mouth/Throat: Oropharynx is clear and moist.  Eyes: Pupils are equal, round, and reactive to light. Conjunctivae and EOM are normal.  Neck: Normal range of motion.  Cardiovascular: Normal rate, regular rhythm and normal heart sounds.   Pulmonary/Chest: Effort normal and breath sounds normal. No respiratory distress. She has no wheezes. Left breast exhibits inverted nipple, mass, skin change and tenderness.  Left breast with area of firmness along lateral margin extending about 4-5 cm, this area is tender to palpation with further tenderness extending to the inferior portion of the breasts, nipple has inverted, some dimpling of the skin noted; no discharge or bleeding, overlying skin is warm to touch without cellulitic changes Right breast appears normal  Abdominal: Soft. Bowel sounds are normal. There is no tenderness. There is no rebound.  Musculoskeletal: Normal range of motion.   Neurological: She is alert and oriented to person, place, and time.  Skin: Skin is warm and dry.  Psychiatric: She has a normal mood and affect.  Nursing note and vitals reviewed.    ED Treatments / Results  Labs (all labs ordered are listed, but only abnormal results are displayed) Labs Reviewed  CBC WITH DIFFERENTIAL/PLATELET - Abnormal; Notable for the following:       Result Value   Monocytes Absolute 0.0 (*)    All other components within normal limits  COMPREHENSIVE METABOLIC PANEL - Abnormal; Notable for the following:    Potassium 3.0 (*)    Chloride 99 (*)    Glucose, Bld 138 (*)  Calcium 8.1 (*)    All other components within normal limits  CULTURE, BLOOD (ROUTINE X 2)  CULTURE, BLOOD (ROUTINE X 2)  I-STAT CG4 LACTIC ACID, ED  I-STAT CG4 LACTIC ACID, ED    EKG  EKG Interpretation None       Radiology Ct Chest W Contrast  Result Date: 07/25/2016 CLINICAL DATA:  Known left breast cancer with increased swelling and heaviness. Warmth. EXAM: CT CHEST WITH CONTRAST TECHNIQUE: Multidetector CT imaging of the chest was performed during intravenous contrast administration. CONTRAST:  172mL ISOVUE-300 IOPAMIDOL (ISOVUE-300) INJECTION 61% COMPARISON:  07/06/2016 FINDINGS: Cardiovascular: Normal heart size. No pericardial effusion. No acute vascular finding. Mediastinum/Nodes: Negative for thoracic adenopathy. 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 The superficial left axillary node is essentially size stable at 17 mm short axis. There is greater coverage of the left breast compared to prior. A mass with calcification is seen in the outer left breast. This skin is diffusely thicker over the left breast when compared to prior. This may reflect inflammatory cancer, but did develop fairly rapidly and infection is not excluded. Per EMR, no radiation has been performed. Reportedly there is concern for abscess. No abscess is present. Lungs/Pleura:  There is no edema, consolidation, effusion, or pneumothorax. Negative for nodule Upper Abdomen: No acute or aggressive finding. Cholelithiasis, known from prior. Musculoskeletal: No acute or aggressive finding. IMPRESSION: Skin thickening over the left breast which is new/ progressed since 07/06/2016. In the setting of ipsilateral breast cancer with axillary adenopathy, this is primarily concerning for inflammatory cancer, but did develop rapidly and needs correlation for infectious symptoms. There was clinical concern for abscess, which is absent. Electronically Signed   By: Monte Fantasia M.D.   On: 07/25/2016 20:32    Procedures Procedures (including critical care time)  Medications Ordered in ED Medications - No data to display   Initial Impression / Assessment and Plan / ED Course  I have reviewed the triage vital signs and the nursing notes.  Pertinent labs & imaging results that were available during my care of the patient were reviewed by me and considered in my medical decision making (see chart for details).  46 year old female with hx of stage III ductal carcinoma in situ here with skin changes of the left breast over the past 2 days.  No fever or chills at home.  Reports her nipple has also now inverted.  Afebrile, non-toxic in appearance here.  Skin of the left breast does show some dimpling changes, nipple was inverted without drainage or bleeding. Along the lateral margin of the breast skin does feel very firm.  No cellulitic changes.  Patient did start chemotherapy on Monday, 5 days ago. We'll plan for screening labs, lactate.  Discussed with radiology regarding best possible imaging given limited ultrasound capabilities at this facility, recommend CT scan of the chest with contrast with focus on soft tissue of the left breast.  Labwork overall reassuring, normal white count. Lactate is normal. Blood cultures sent and are pending.  CT scan with skin thickening over the left breast  which is progressed from MRI on 07-06-16.  There is concern that this may represent inflammatory cancer. There is no underlying abscess, hematoma, or other abnormalities. No cellulitic changes on my exam. Patient was discussed with on-call oncology, Dr. Marin Olp-- recommends close follow-up in clinic on Monday, patient may need punch biopsy of the skin as well as repeat MRI. I have message patient's oncologist, [name], about CT findings today  and need for close follow-up.  Patient and her family all acknowledged understanding and agreed with plan of care, all questions were answered. Patient was given strict return cautions for any new or worsening symptoms including increased swelling of the breast, erythema, high fever, bleeding, etc.  Final Clinical Impressions(s) / ED Diagnoses   Final diagnoses:  History of skin changes of breast    New Prescriptions Discharge Medication List as of 07/25/2016  9:39 PM       Larene Pickett, PA-C 07/25/16 2237    Varney Biles, MD 07/25/16 2344

## 2016-07-25 NOTE — Discharge Instructions (Signed)
Monitor skin changes at home for any increasing redness, swelling, drainage, bleeding, or if you develop fever. Follow-up with [name] on Monday-- as we discussed, you may need to be scheduled for punch biopsy and/or repeat MRI. I have messaged [name] about your visit so he should be expecting you. Return to the ED for new or worsening symptoms.

## 2016-07-25 NOTE — ED Notes (Signed)
Alert, NAD, calm, interactive, resps e/u, speaking in clear complete sentences, no dyspnea noted, skin W&D, updated, steady gait to b/r, family x2 present, (denies: pain, sob, nausea, dizziness or visual changes). Family at [facility].

## 2016-07-27 ENCOUNTER — Other Ambulatory Visit: Payer: Self-pay

## 2016-07-27 ENCOUNTER — Ambulatory Visit: Payer: Medicaid Other | Attending: Internal Medicine | Admitting: Internal Medicine

## 2016-07-27 ENCOUNTER — Encounter: Payer: Self-pay | Admitting: Internal Medicine

## 2016-07-27 VITALS — BP 115/76 | HR 73 | Temp 98.8°F | Resp 16 | Wt 259.6 lb

## 2016-07-27 DIAGNOSIS — K5900 Constipation, unspecified: Secondary | ICD-10-CM | POA: Insufficient documentation

## 2016-07-27 DIAGNOSIS — Z79899 Other long term (current) drug therapy: Secondary | ICD-10-CM | POA: Insufficient documentation

## 2016-07-27 DIAGNOSIS — R51 Headache: Secondary | ICD-10-CM | POA: Diagnosis not present

## 2016-07-27 DIAGNOSIS — C50412 Malignant neoplasm of upper-outer quadrant of left female breast: Secondary | ICD-10-CM

## 2016-07-27 DIAGNOSIS — N926 Irregular menstruation, unspecified: Secondary | ICD-10-CM | POA: Diagnosis not present

## 2016-07-27 DIAGNOSIS — Z800 Family history of malignant neoplasm of digestive organs: Secondary | ICD-10-CM | POA: Insufficient documentation

## 2016-07-27 DIAGNOSIS — I10 Essential (primary) hypertension: Secondary | ICD-10-CM | POA: Insufficient documentation

## 2016-07-27 DIAGNOSIS — R110 Nausea: Secondary | ICD-10-CM | POA: Insufficient documentation

## 2016-07-27 DIAGNOSIS — Z171 Estrogen receptor negative status [ER-]: Secondary | ICD-10-CM

## 2016-07-27 DIAGNOSIS — Z803 Family history of malignant neoplasm of breast: Secondary | ICD-10-CM | POA: Insufficient documentation

## 2016-07-27 DIAGNOSIS — Z833 Family history of diabetes mellitus: Secondary | ICD-10-CM | POA: Diagnosis not present

## 2016-07-27 DIAGNOSIS — Z8249 Family history of ischemic heart disease and other diseases of the circulatory system: Secondary | ICD-10-CM | POA: Diagnosis not present

## 2016-07-27 DIAGNOSIS — Z801 Family history of malignant neoplasm of trachea, bronchus and lung: Secondary | ICD-10-CM | POA: Insufficient documentation

## 2016-07-27 DIAGNOSIS — Z8261 Family history of arthritis: Secondary | ICD-10-CM | POA: Insufficient documentation

## 2016-07-27 DIAGNOSIS — R519 Headache, unspecified: Secondary | ICD-10-CM

## 2016-07-27 DIAGNOSIS — C50912 Malignant neoplasm of unspecified site of left female breast: Secondary | ICD-10-CM

## 2016-07-27 DIAGNOSIS — Z9889 Other specified postprocedural states: Secondary | ICD-10-CM | POA: Diagnosis not present

## 2016-07-27 MED ORDER — DOCUSATE SODIUM 100 MG PO CAPS: 100.0000 mg | ORAL_CAPSULE | Freq: Two times a day (BID) | ORAL | 2 refills | Status: DC

## 2016-07-27 NOTE — Progress Notes (Signed)
Pt states Saturday her left breast became warm to touch and tender Pt states she called the on call and she was [facility] Pt states she got a CT done and they stated the skin is growing

## 2016-07-27 NOTE — Progress Notes (Signed)
Patient ID: [name], female    DOB: Sep 13, 1970  MRN: [id number]  CC: Follow-up   Subjective: [name] is a 46 y.o. female who presents for f/u visit. Daughter is with her. Her concerns today include:  1. Breast CA -has had 1st round chemo.  Possible surgery in Dec post chem -Patient seen in ER this weekend for redness, warmth and heaviness of the left breast with retraction of the nipple.  Had CAT scan that revealed thickening that seemed consistent with inflammatory breast CA  Has onc appt tomorrow -good family support.  Sleeping OK. Trazodone helps  2.HTN Checking BP.  Range SBP 120-130s, DBP 70-80. Compliant with HCTZ  3.  Obesity: -husband cooking all meals -some constipation over past wk despite Mirilax.  4. Headaches -thinks it is stress.  Started several wks ago -3-4 x a wk.  Last 3-4 hrs [place] areas and back of head.  Not into neck -more of a dull ache. -+ nausea.  No photophobia. Wears reading glasses and vision has change -not taking anything for HA -better laying down -no previous HA hx.   Patient Active Problem List   Diagnosis Date Noted  . Constipation 07/27/2016  . Malignant neoplasm of upper-outer quadrant of left breast in female, estrogen receptor negative ([place]) 06/23/2016  . Left breast mass 06/12/2016  . Essential hypertension 06/12/2016  . Class 3 severe obesity due to excess calories without serious comorbidity with body mass index (BMI) of 40.0 to 44.9 in adult ([place]) 06/12/2016  . Low vitamin D level 06/12/2016  . Irregular menses 06/12/2016     Current Outpatient Prescriptions on File Prior to Visit  Medication Sig Dispense Refill  . acetaminophen (TYLENOL) 500 MG tablet Take 1,000 mg by mouth every 6 (six) hours as needed. For fever/pain    . dexamethasone (DECADRON) 4 MG tablet Take 2 tablets by mouth once a day on the day after chemotherapy and then take 2 tablets two times a day for 2 days. Take with food. 30 tablet 1  .  fluticasone (FLONASE) 50 MCG/ACT nasal spray Place 2 sprays into the nose daily. 16 g 0  . hydrochlorothiazide (HYDRODIURIL) 25 MG tablet Take 0.5 tablets (12.5 mg total) by mouth daily. 45 tablet 3  . lidocaine-prilocaine (EMLA) cream Apply to affected area once 30 g 3  . loratadine (CLARITIN) 10 MG tablet Take 1 tablet (10 mg total) by mouth daily. 90 tablet 1  . LORazepam (ATIVAN) 0.5 MG tablet Take 1 tablet (0.5 mg total) by mouth at bedtime as needed (Nausea or vomiting). 30 tablet 0  . prochlorperazine (COMPAZINE) 10 MG tablet Take 1 tablet (10 mg total) by mouth every 6 (six) hours as needed (Nausea or vomiting). 30 tablet 1  . traZODone (DESYREL) 50 MG tablet Take 0.5-1 tablets (25-50 mg total) by mouth at bedtime as needed for sleep. 30 tablet 3   No current facility-administered medications on file prior to visit.     No Known Allergies  Social History   Social History  . Marital status: Married    Spouse name: N/A  . Number of children: N/A  . Years of education: N/A   Occupational History  . Not on file.   Social History Main Topics  . Smoking status: Never Smoker  . Smokeless tobacco: Never Used  . Alcohol use No  . Drug use: No  . Sexual activity: Not on file   Other Topics Concern  . Not on file   Social  History Narrative  . No narrative on file    Family History  Problem Relation Age of Onset  . Diabetes Mother   . Hypertension Mother   . Arthritis Mother   . Colon cancer Father   . Diabetes Father   . Heart disease Father   . Hypertension Father   . Breast cancer Maternal Grandmother   . Breast cancer Maternal Aunt   . Cervical cancer Maternal Aunt   . Lung cancer Maternal Aunt     Past Surgical History:  Procedure Laterality Date  . ANKLE SURGERY    . IR FLUORO GUIDE PORT INSERTION RIGHT  07/14/2016  . IR US GUIDE VASC ACCESS RIGHT  07/14/2016  . TUBAL LIGATION      ROS: Review of Systems Negative except as stated above  PHYSICAL  EXAM: BP 115/76   Pulse 73   Temp 98.8 F (37.1 C) (Oral)   Resp 16   Wt 259 lb 9.6 oz (117.8 kg)   LMP 07/06/2016   SpO2 96%   BMI 40.06 kg/m   Wt Readings from Last 3 Encounters:  07/27/16 259 lb 9.6 oz (117.8 kg)  07/25/16 257 lb (116.6 kg)  07/17/16 257 lb (116.6 kg)   Physical Exam General appearance - alert, well appearing, middle-age Caucasian female and in no distress Mental status - alert, oriented to person, place, and time, normal mood, behavior, speech, dress, motor activity, and thought processes Neck - supple, no significant adenopathy Chest - clear to auscultation, no wheezes, rales or rhonchi, symmetric air entry Heart - normal rate, regular rhythm, normal S1, S2, no murmurs, rubs, clicks or gallops Breasts - left breast: Nipple is retracted. Some dimpling of the skin. Large firm mass occupying the outer mid to lower quadrant. Skin is very warm to touch. No erythema. No fluctuance. Neurological - cranial nerves II through XII intact, motor and sensory grossly normal bilaterally Extremities - peripheral pulses normal, no pedal edema, no clubbing or cyanosis   ASSESSMENT AND PLAN: 1. Constipation, unspecified constipation type -Recommend increase fiber intake. Add Colace to MiraLAX. -Patient reports that oncologist recommended that she hold off on having the colonoscopy (ordered on last visit) while going through initial chemotherapy - docusate sodium (COLACE) 100 MG capsule; Take 1 capsule (100 mg total) by mouth 2 (two) times daily.  Dispense: 60 capsule; Refill: 2  2. Intractable episodic headache, unspecified headache type -Headache seems consistent with tension headache. However given this is new symptom in recently diagnosed breast cancer, we need to do a CAT scan of the head to rule out any metastasis -Okay to use Tylenol or Advil over-the-counter when necessary  3. Malignant neoplasm of left breast in female, estrogen receptor negative, unspecified site of  breast ([place]) -pt to keep appointment with her oncologist - CT Head Wo Contrast; Future  4. Essential hypertension At goal   Patient was given the opportunity to ask questions.  Patient verbalized understanding of the plan and was able to repeat key elements of the plan.   Orders Placed This Encounter  Procedures  . CT Head Wo Contrast     Requested Prescriptions   Signed Prescriptions Disp Refills  . docusate sodium (COLACE) 100 MG capsule 60 capsule 2    Sig: Take 1 capsule (100 mg total) by mouth 2 (two) times daily.    Return in about 3 months (around 10/27/2016), or if symptoms worsen or fail to improve.  [name], MD, FACP

## 2016-07-27 NOTE — Progress Notes (Signed)
[place]  Telephone:(336) [phone] Fax:(336) [phone]     ID: Annita Ratliff DOB: 1970/05/24  MR#: [id number]  QIH#:[id number]  Patient Care Team: [name], MD as PCP - General (Internal Medicine) [name], MD as Consulting Physician (General Surgery) Dreyden Rohrman, [name], MD as Consulting Physician (Oncology) [name], MD as Attending Physician (Radiation Oncology) Chauncey Cruel, MD OTHER MD:  CHIEF COMPLAINT: Triple negative breast cancer  CURRENT TREATMENT: Neoadjuvant chemotherapy  INTERVAL HISTORY: Coutney returns today for follow-up and treatment of her triple negative breast cancer. Today is day 9 cycle 1 of 4 planned cycles of cyclophosphamide and doxorubicin to be given in dose dense fashion, to be followed by weekly carboplatin and paclitaxel 12  She did remarkably well with her first cycle of chemotherapy, with no nausea whatsoever, much less vomiting. By Thursday, which was day 4, she was beginning to feel a little "peaked". She felt even worse by Saturday morning, which was day 6. That day her breast started to hurt and feel tender in ATM swollen. The nipple seemed to have retracted more. She called and was directed to the emergency room where a CT scan of the chest was obtained. This shows swelling suggestive to them of possible inflammatory breast cancer. It did not suggest infection. She was not started on an antibiotic.  Whatever the problem was the pain and swelling completely resolved over the next day and a half. Know she never had any fever associated with this.  REVIEW OF SYSTEMS: Aside from the problems above, she was somewhat constipated. She usually has a bowel movement every 2-3 days, this time she had 1 just once a week. She has not yet begun to lose her hair. A detailed review of systems today was otherwise noncontributory   BREAST CANCER HISTORY: From the original intake note:  The patient was seen in the emergency  room 05/16/2016 with nonspecific complaints but also noting that she had a tender lump in her lateral left breast which she said she had noted the day before. Exam by the emergency room physician confirmed a 3 cm soft left lateral breast mass without overlying erythema or nipple changes. This was felt to be most consistent with fibrocystic change but the patient was referred back to her primary care physician for further evaluation. She saw the physician assistant in May 18 and then Dr. Wynetta Emery on 06/12/2016 who scheduled a bilateral diagnostic mammography with tomography and left breast ultrasonography at the [facility] 06/18/2016. This found the breast density to be category B. In the left breast upper outer quadrant there was an area of asymmetry measuring up to 10.3 cm. On exam this was firm and palpable and on ultrasound there was an irregular mass measuring 2.5 cm with some subtle changes in the surrounding tissue. In the left lower axilla there were 2 suspicious-looking lymph nodes.  Biopsy of the left breast upper outer quadrant and one of the suspicious lymph nodes 06/19/2016 showed ([id number]) both to be involved by invasive ductal carcinoma, grade 3, estrogen and progesterone receptor negative, HER-2 not amplified with a signals ratio being 1.53-1.74 and the number per cell 2.35-3.05. The MIB-1 was 90%.  The patient's subsequent history is as detailed below.   PAST MEDICAL HISTORY: Past Medical History:  Diagnosis Date  . Malignant neoplasm of upper-outer quadrant of left breast in female, estrogen receptor negative ([place]) 06/23/2016    PAST SURGICAL HISTORY: Past Surgical History:  Procedure Laterality Date  . ANKLE SURGERY    .  IR FLUORO GUIDE PORT INSERTION RIGHT  07/14/2016  . IR US GUIDE VASC ACCESS RIGHT  07/14/2016  . TUBAL LIGATION      FAMILY HISTORY Family History  Problem Relation Age of Onset  . Diabetes Mother   . Hypertension Mother   . Arthritis Mother   . Colon  cancer Father   . Diabetes Father   . Heart disease Father   . Hypertension Father   . Breast cancer Maternal Grandmother   . Breast cancer Maternal Aunt   . Cervical cancer Maternal Aunt   . Lung cancer Maternal Aunt   The patient's father died at age 46 from colon cancer which she never had treated. The patient's mother is living, 74 years old as of June 2018. The patient has one brother, one sister. On the mother's side there is a history of breast cancer in the grandmother, age 9, and then 1 aunt with breast cancer age 18, and another with lung cancer age 85. There is no history of ovarian cancer in the family.  GYNECOLOGIC HISTORY:  Patient's last menstrual period was 07/06/2016. Menarche age 79, first live birth age 33. She is GX P4. Her periods have never been regular. She had a period in June 2018 but not for 4 months before that. She took birth control for some years remotely with no complications. She is status post bilateral tubal ligation.  SOCIAL HISTORY:  The patient's husband owns and runs a food truck area did the serve mostly [place] food. 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 Her husband Satori Krabill (goes by "[name]") is originally from care of. The patient's children from her first marriage are Sunday Shams 47 years old (all ages as of June 2018) currently in the [employer], and [name], who is studying psychology at [name]. Together with [name] the patient's children are [name] and hila, aged 21 and 97.     ADVANCED DIRECTIVES: Not in place   HEALTH MAINTENANCE: Social History  Substance Use Topics  . Smoking status: Never Smoker  . Smokeless tobacco: Never Used  . Alcohol use No     Colonoscopy:Never   QAS:TMHD 2018  Bone density:Never   No Known Allergies  Current Outpatient Prescriptions  Medication Sig Dispense Refill  . acetaminophen (TYLENOL) 500 MG tablet Take  1,000 mg by mouth every 6 (six) hours as needed. For fever/pain    . ciprofloxacin (CIPRO) 500 MG tablet Take 1 tablet (500 mg total) by mouth 2 (two) times daily. 40 tablet 1  . dexamethasone (DECADRON) 4 MG tablet Take 2 tablets by mouth once a day on the day after chemotherapy and then take 2 tablets two times a day for 2 days. Take with food. 30 tablet 1  . docusate sodium (COLACE) 100 MG capsule Take 1 capsule (100 mg total) by mouth 2 (two) times daily. 60 capsule 2  . fluticasone (FLONASE) 50 MCG/ACT nasal spray Place 2 sprays into the nose daily. 16 g 0  . hydrochlorothiazide (HYDRODIURIL) 25 MG tablet Take 0.5 tablets (12.5 mg total) by mouth daily. 45 tablet 3  . lidocaine-prilocaine (EMLA) cream Apply to affected area once 30 g 3  . loratadine (CLARITIN) 10 MG tablet Take 1 tablet (10 mg total) by mouth daily. 90 tablet 1  . LORazepam (ATIVAN) 0.5 MG tablet Take 1 tablet (0.5 mg total) by mouth at bedtime as needed (Nausea or vomiting). 30 tablet  0  . prochlorperazine (COMPAZINE) 10 MG tablet Take 1 tablet (10 mg total) by mouth every 6 (six) hours as needed (Nausea or vomiting). 30 tablet 1  . traZODone (DESYREL) 50 MG tablet Take 0.5-1 tablets (25-50 mg total) by mouth at bedtime as needed for sleep. 30 tablet 3   No current facility-administered medications for this visit.     OBJECTIVE: Middle-aged white woman In no acute distress  Vitals:   07/28/16 1609  BP: 125/88  Pulse: 77  Resp: 18  Temp: 98.3 F (36.8 C)     Body mass index is 39.3 kg/m.   Filed Weights   07/28/16 1609  Weight: 254 lb 11.2 oz (115.5 kg)       ECOG FS:1 - Symptomatic but completely ambulatory  Sclerae unicteric, pupils round and equal Oropharynx clear and moist No cervical or supraclavicular adenopathy Lungs no rales or rhonchi Heart regular rate and rhythm Abd soft, nontender, positive bowel sounds MSK no focal spinal tenderness, no upper extremity lymphedema Neuro: nonfocal, well  oriented, appropriate affect Breasts: The right breast is unremarkable. The left breast is soft, not swollen, in fact wrinkled, with no erythema whatsoever, and only very minimal nipple retraction, no different from what was noted before. There is still a movable mass in the lateral aspect of the left breast. Both axillae are benign.  LAB RESULTS:  CMP     Component Value Date/Time   NA 138 07/28/2016 1534   K 3.8 07/28/2016 1534   CL 99 (L) 07/25/2016 1830   CO2 28 07/28/2016 1534   GLUCOSE 108 07/28/2016 1534   BUN 5.9 (L) 07/28/2016 1534   CREATININE 0.7 07/28/2016 1534   CALCIUM 9.1 07/28/2016 1534   PROT 6.9 07/28/2016 1534   ALBUMIN 3.3 (L) 07/28/2016 1534   AST 11 07/28/2016 1534   ALT 20 07/28/2016 1534   ALKPHOS 96 07/28/2016 1534   BILITOT 0.58 07/28/2016 1534   GFRNONAA >60 07/25/2016 1830   GFRAA >60 07/25/2016 1830    No results found for: TOTALPROTELP, ALBUMINELP, A1GS, A2GS, BETS, BETA2SER, GAMS, MSPIKE, SPEI  No results found for: KPAFRELGTCHN, LAMBDASER, KAPLAMBRATIO  Lab Results  Component Value Date   WBC 1.4 (L) 07/28/2016   NEUTROABS 0.2 (LL) 07/28/2016   HGB 12.7 07/28/2016   HCT 37.9 07/28/2016   MCV 83.9 07/28/2016   PLT 155 07/28/2016      Chemistry      Component Value Date/Time   NA 138 07/28/2016 1534   K 3.8 07/28/2016 1534   CL 99 (L) 07/25/2016 1830   CO2 28 07/28/2016 1534   BUN 5.9 (L) 07/28/2016 1534   CREATININE 0.7 07/28/2016 1534      Component Value Date/Time   CALCIUM 9.1 07/28/2016 1534   ALKPHOS 96 07/28/2016 1534   AST 11 07/28/2016 1534   ALT 20 07/28/2016 1534   BILITOT 0.58 07/28/2016 1534       No results found for: LABCA2  No components found for: GQQPYP950  No results for input(s): INR in the last 168 hours.  Urinalysis    Component Value Date/Time   COLORURINE AMBER (A) 05/16/2016 1709   APPEARANCEUR CLOUDY (A) 05/16/2016 1709   LABSPEC 1.023 05/16/2016 1709   PHURINE 5.5 05/16/2016 1709    GLUCOSEU NEGATIVE 05/16/2016 1709   HGBUR NEGATIVE 05/16/2016 1709   BILIRUBINUR NEGATIVE 05/16/2016 1709   KETONESUR NEGATIVE 05/16/2016 1709   PROTEINUR NEGATIVE 05/16/2016 1709   UROBILINOGEN 0.2 01/04/2010 1911   NITRITE NEGATIVE 05/16/2016  [place] 05/16/2016 1709     STUDIES: Ct Chest W Contrast  Result Date: 07/25/2016 CLINICAL DATA:  Known left breast cancer with increased swelling and heaviness. [place]. EXAM: CT CHEST WITH CONTRAST TECHNIQUE: Multidetector CT imaging of the chest was performed during intravenous contrast administration. CONTRAST:  164m ISOVUE-300 IOPAMIDOL (ISOVUE-300) INJECTION 61% COMPARISON:  07/06/2016 FINDINGS: Cardiovascular: Normal heart size. No pericardial effusion. No acute vascular finding. Mediastinum/Nodes: Negative for thoracic adenopathy. 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 The superficial left axillary node is essentially size stable at 17 mm short axis. There is greater coverage of the left breast compared to prior. A mass with calcification is seen in the outer left breast. This skin is diffusely thicker over the left breast when compared to prior. This may reflect inflammatory cancer, but did develop fairly rapidly and infection is not excluded. Per EMR, no radiation has been performed. Reportedly there is concern for abscess. No abscess is present. Lungs/Pleura: There is no edema, consolidation, effusion, or pneumothorax. Negative for nodule Upper Abdomen: No acute or aggressive finding. Cholelithiasis, known from prior. Musculoskeletal: No acute or aggressive finding. IMPRESSION: Skin thickening over the left breast which is new/ progressed since 07/06/2016. In the setting of ipsilateral breast cancer with axillary adenopathy, this is primarily concerning for inflammatory cancer, but did develop rapidly and needs correlation for infectious symptoms. There was clinical concern for abscess, which is  absent. Electronically Signed   By: JMonte FantasiaM.D.   On: 07/25/2016 20:32   Ct Chest W Contrast  Result Date: 07/06/2016 CLINICAL DATA:  Left breast cancer. EXAM: CT CHEST WITH CONTRAST TECHNIQUE: Multidetector CT imaging of the chest was performed during intravenous contrast administration. CONTRAST:  75 cc Isovue-300. COMPARISON:  03/02/2005. FINDINGS: Cardiovascular: Vascular structures are unremarkable. Heart is at the upper limits of normal in size. No pericardial effusion. Mediastinum/Nodes: Masslike soft tissue is seen in the lateral aspect of the left breast, measuring approximately 3.4 cm. There are 2 enlarged lymph nodes in the left axilla, measuring up to 1.6 cm in short axis. No mediastinal, hilar, right axillary or internal mammary adenopathy. Esophagus is grossly unremarkable. Lungs/Pleura: There are scattered subpleural lymph nodes measuring up to 4 mm, unchanged. No pleural fluid. Airway is unremarkable. Upper Abdomen: Visualized portion of the liver is unremarkable. A stone is partially imaged in the gallbladder. Visualized portions of the adrenal glands, kidneys, spleen, pancreas, stomach and bowel are grossly unremarkable. No upper abdominal adenopathy. Musculoskeletal: No worrisome lytic or sclerotic lesions. IMPRESSION: 1. Left breast mass with left axillary adenopathy. No additional evidence of metastatic disease. 2. Cholelithiasis. Electronically Signed   By: MLorin PicketM.D.   On: 07/06/2016 08:57   Nm Bone Scan Whole Body  Result Date: 07/06/2016 CLINICAL DATA:  Left-sided breast carcinoma EXAM: NUCLEAR MEDICINE WHOLE BODY BONE SCAN TECHNIQUE: Whole body anterior and posterior images were obtained approximately 3 hours after intravenous injection of radiopharmaceutical. RADIOPHARMACEUTICALS:  19.2 mCi Technetium-928mDP IV COMPARISON:  Chest CT July 06, 2016 FINDINGS: There is a punctate focus of increased radiotracer uptake at the level of the pedicle on the right at T11.  There is modest increased uptake in each knee and shoulder, likely of arthropathic etiology. Elsewhere, the distribution of uptake of radiotracer is within normal limits. Kidneys are noted in the flank positions bilaterally. IMPRESSION: Small focus of increased uptake in the region of the pedicle of T11 on the right. CT through this area shows equivocal arthropathy in  this area. Areas of increased uptake in the shoulders and knees is almost certainly of arthropathic etiology. There are no findings which are felt to be indicative of bony metastatic disease. Electronically Signed   By: Lowella Grip III M.D.   On: 07/06/2016 12:00   Mr Breast Bilateral W Wo Contrast  Result Date: 07/06/2016 CLINICAL DATA:  Newly diagnosed left breast cancer with metastatic lymph node. LABS:  GFR greater than 60 EXAM: BILATERAL BREAST MRI WITH AND WITHOUT CONTRAST TECHNIQUE: Multiplanar, multisequence MR images of both breasts were obtained prior to and following the intravenous administration of 20 ml of MultiHance. THREE-DIMENSIONAL MR IMAGE RENDERING ON INDEPENDENT WORKSTATION: Three-dimensional MR images were rendered by post-processing of the original MR data on an independent workstation. The three-dimensional MR images were interpreted, and findings are reported in the following complete MRI report for this study. Three dimensional images were evaluated at the independent DynaCad workstation COMPARISON:  Previous exam(s). FINDINGS: Breast composition: b. Scattered fibroglandular tissue. Background parenchymal enhancement: Moderate. Right breast: No mass or abnormal enhancement. Left breast: Irregular enhancing mass within the upper-outer quadrant of the left breast, at middle depth, measuring 2.5 x 2 x 1.6 cm (transverse by AP by craniocaudal dimensions) with biopsy clip artifact, consistent with biopsy proven carcinoma. Additional extensive non-mass enhancement extending anterior, superior and inferior from the  biopsy-proven carcinoma. This extensive non-mass enhancement within the outer left breast extends from posterior to anterior/subareolar depth, measuring at least 10 cm AP dimension and 7 cm craniocaudal dimension, almost certainly indicating multifocal and multicentric disease. Lymph nodes: 3 enlarged/morphologically abnormal lymph nodes are identified in the left axilla, 1 of which corresponds to the biopsy-proven lymph node metastasis. No enlarged or morphologically abnormal lymph nodes are identified in the right axilla or within the internal mammary chain regions. Ancillary findings:  None. IMPRESSION: 1. Biopsy-proven carcinoma within the upper-outer quadrant of the left breast, at middle depth, measuring 2.5 cm, with associated biopsy clip artifact. 2. Extensive highly suspicious non-mass enhancement within the outer left breast, involving the upper outer quadrant and lower outer quadrant, extending from posterior depth to anterior/subareolar depth, measuring over 10 cm AP extent and 7 cm craniocaudal dimension, almost certainly indicating multicentric disease and corresponding to the extent of asymmetry seen on initial diagnostic mammogram of 06/18/2016. 3. Three enlarged/morphologically abnormal lymph nodes in the left axilla, 1 of which corresponds to the biopsy-proven lymph node metastasis. No additional lymphadenopathy in the right axilla or internal mammary chain regions. 4. No evidence of malignancy within the right breast. RECOMMENDATION: 1. Per current treatment plan for patient's known left breast cancer. 2. If breast conservation therapy for the left breast was considered, MRI-guided biopsies would be needed for the most anterior and superior aspects of the extensive non-mass enhancement in the outer left breast to confirm extent of the presumed multicentric disease. BI-RADS CATEGORY  6: Known biopsy-proven malignancy. Electronically Signed   By: Franki Cabot M.D.   On: 07/06/2016 11:53   Ir US  Guide Vasc Access Right  Result Date: 07/14/2016 CLINICAL DATA:  Left breast carcinoma common the durable venous access for chemotherapy regimen. EXAM: TUNNELED PORT CATHETER PLACEMENT WITH ULTRASOUND AND FLUOROSCOPIC GUIDANCE FLUOROSCOPY TIME:  0.1 minute, 40uGym2 DAP ANESTHESIA/SEDATION: Intravenous Fentanyl and Versed were administered as conscious sedation during continuous monitoring of the patient's level of consciousness and physiological / cardiorespiratory status by the radiology RN, with a total moderate sedation time of 22 minutes. TECHNIQUE: The procedure, risks, benefits, and alternatives were explained to the  patient. Questions regarding the procedure were encouraged and answered. The patient understands and consents to the procedure. As antibiotic prophylaxis, cefazolin 2 g was ordered pre-procedure and administered intravenously within one hour of incision. Patency of the right IJ vein was confirmed with ultrasound with image documentation. An appropriate skin site was determined. Skin site was marked. Region was prepped using maximum barrier technique including cap and mask, sterile gown, sterile gloves, large sterile sheet, and Chlorhexidine as cutaneous antisepsis. The region was infiltrated locally with 1% lidocaine. Under real-time ultrasound guidance, the right IJ vein was accessed with a 21 gauge micropuncture needle; the needle tip within the vein was confirmed with ultrasound image documentation. Needle was exchanged over a 018 guidewire for transitional dilator which allowed passage of the [facility] wire into the IVC. Over this, the transitional dilator was exchanged for a 5 [place] MPA catheter. A small incision was made on the right anterior chest wall and a subcutaneous pocket fashioned. The power-injectable port was positioned and its catheter tunneled to the right IJ dermatotomy site. The MPA catheter was exchanged over an Amplatz wire for a peel-away sheath, through which the port  catheter, which had been trimmed to the appropriate length, was advanced and positioned under fluoroscopy with its tip at the cavoatrial junction. Spot chest radiograph confirms good catheter position and no pneumothorax. The pocket was closed with deep interrupted and subcuticular continuous 3-0 Monocryl sutures. The port was flushed per protocol. The incisions were covered with Dermabond then covered with a sterile dressing. COMPLICATIONS: COMPLICATIONS None immediate IMPRESSION: Technically successful right IJ power-injectable port catheter placement. Ready for routine use. Electronically Signed   By: Lucrezia Europe M.D.   On: 07/14/2016 12:43   Ir Fluoro Guide Port Insertion Right  Result Date: 07/14/2016 CLINICAL DATA:  Left breast carcinoma common the durable venous access for chemotherapy regimen. EXAM: TUNNELED PORT CATHETER PLACEMENT WITH ULTRASOUND AND FLUOROSCOPIC GUIDANCE FLUOROSCOPY TIME:  0.1 minute, 40uGym2 DAP ANESTHESIA/SEDATION: Intravenous Fentanyl and Versed were administered as conscious sedation during continuous monitoring of the patient's level of consciousness and physiological / cardiorespiratory status by the radiology RN, with a total moderate sedation time of 22 minutes. TECHNIQUE: The procedure, risks, benefits, and alternatives were explained to the patient. Questions regarding the procedure were encouraged and answered. The patient understands and consents to the procedure. As antibiotic prophylaxis, cefazolin 2 g was ordered pre-procedure and administered intravenously within one hour of incision. Patency of the right IJ vein was confirmed with ultrasound with image documentation. An appropriate skin site was determined. Skin site was marked. Region was prepped using maximum barrier technique including cap and mask, sterile gown, sterile gloves, large sterile sheet, and Chlorhexidine as cutaneous antisepsis. The region was infiltrated locally with 1% lidocaine. Under real-time  ultrasound guidance, the right IJ vein was accessed with a 21 gauge micropuncture needle; the needle tip within the vein was confirmed with ultrasound image documentation. Needle was exchanged over a 018 guidewire for transitional dilator which allowed passage of the [facility] wire into the IVC. Over this, the transitional dilator was exchanged for a 5 [place] MPA catheter. A small incision was made on the right anterior chest wall and a subcutaneous pocket fashioned. The power-injectable port was positioned and its catheter tunneled to the right IJ dermatotomy site. The MPA catheter was exchanged over an Amplatz wire for a peel-away sheath, through which the port catheter, which had been trimmed to the appropriate length, was advanced and positioned under fluoroscopy with its tip at  the cavoatrial junction. Spot chest radiograph confirms good catheter position and no pneumothorax. The pocket was closed with deep interrupted and subcuticular continuous 3-0 Monocryl sutures. The port was flushed per protocol. The incisions were covered with Dermabond then covered with a sterile dressing. COMPLICATIONS: COMPLICATIONS None immediate IMPRESSION: Technically successful right IJ power-injectable port catheter placement. Ready for routine use. Electronically Signed   By: Lucrezia Europe M.D.   On: 07/14/2016 12:43    ELIGIBLE FOR AVAILABLE RESEARCH PROTOCOL: no   ASSESSMENT: 46 y.o. [place] woman status post left breast upper outer quadrant and left axillary lymph node biopsy 06/19/2016, both positive for a clinical T2-T3 N1, stage IIIB-C invasive ductal carcinoma, grade 3, triple negative, with an MIB-1 of 90%  (1) genetics testing pending  (2) neoadjuvant chemotherapy to consist of doxorubicin and cyclophosphamide in dose dense fashion 4 followed by paclitaxel weekly 12 given with carboplatin starting 07/20/2016  (3) definitive surgery to follow  (4) adjuvant radiation to follow surgery  PLAN: I am not sure  exactly what was seen in the emergency room on the 6 of treatment, but clearly lovely does not have inflammatory breast cancer. There is not a hint of redness or swelling in the left breast today.  She does have significant neutropenia. I think it would be prudent to prophylax her with ciprofloxacin and we will do 500 mg twice daily for 5 days beginning this evening. I'm going ahead and writing her for total of 40 tablets which should be sufficient for the first 4 cycles of chemotherapy.  She understands she is going to be losing her hair within the next 10 days. She already has an appointment for a possible wait donation and is planning to wear had some bandanna [place].  Since she did so well regarding nausea and cutting down her dose of dexamethasone to 4 mg twice a day for the first 2-1/2 days. I am also adding an additional morning dose on Friday and Saturday morning's since that's when she was feeling "peaked". She will continue to use the Compazine as before.  Also she will start stool softeners on day 1 of treatment. Hopefully that will take care of the constipation issue.  Otherwise she will return July 31 for cycle 2 and then a week later to troubleshoot problems after cycle 2.  Chauncey Cruel, MD   07/28/2016 8:09 PM Medical Oncology and Hematology [facility] [address] [place], [place] [address] Tel. [phone]    Fax. [phone]

## 2016-07-27 NOTE — Patient Instructions (Signed)
1.  Use the Colace to help decrease constipation.  Try eating a green leafy veggie with at least one of your meals daily.  Incorporate fiber in your diet.   2. Use Tylenol or Advil as needed for headache.  Try relaxation techniques like deep breathing to help relax.   3. We will get a CAT scan of your head.

## 2016-07-28 ENCOUNTER — Other Ambulatory Visit (HOSPITAL_BASED_OUTPATIENT_CLINIC_OR_DEPARTMENT_OTHER): Payer: Medicaid Other

## 2016-07-28 ENCOUNTER — Other Ambulatory Visit: Payer: Self-pay | Admitting: *Deleted

## 2016-07-28 ENCOUNTER — Ambulatory Visit (HOSPITAL_BASED_OUTPATIENT_CLINIC_OR_DEPARTMENT_OTHER): Payer: Medicaid Other | Admitting: Oncology

## 2016-07-28 ENCOUNTER — Encounter: Payer: Self-pay | Admitting: *Deleted

## 2016-07-28 VITALS — BP 125/88 | HR 77 | Temp 98.3°F | Resp 18 | Ht 67.5 in | Wt 254.7 lb

## 2016-07-28 DIAGNOSIS — D709 Neutropenia, unspecified: Secondary | ICD-10-CM

## 2016-07-28 DIAGNOSIS — Z171 Estrogen receptor negative status [ER-]: Secondary | ICD-10-CM | POA: Diagnosis not present

## 2016-07-28 DIAGNOSIS — C50412 Malignant neoplasm of upper-outer quadrant of left female breast: Secondary | ICD-10-CM

## 2016-07-28 DIAGNOSIS — C773 Secondary and unspecified malignant neoplasm of axilla and upper limb lymph nodes: Secondary | ICD-10-CM

## 2016-07-28 LAB — COMPREHENSIVE METABOLIC PANEL
ALBUMIN: 3.3 g/dL — AB (ref 3.5–5.0)
ALK PHOS: 96 U/L (ref 40–150)
ALT: 20 U/L (ref 0–55)
AST: 11 U/L (ref 5–34)
Anion Gap: 7 mEq/L (ref 3–11)
BUN: 5.9 mg/dL — AB (ref 7.0–26.0)
CALCIUM: 9.1 mg/dL (ref 8.4–10.4)
CHLORIDE: 103 meq/L (ref 98–109)
CO2: 28 mEq/L (ref 22–29)
Creatinine: 0.7 mg/dL (ref 0.6–1.1)
Glucose: 108 mg/dl (ref 70–140)
POTASSIUM: 3.8 meq/L (ref 3.5–5.1)
SODIUM: 138 meq/L (ref 136–145)
Total Bilirubin: 0.58 mg/dL (ref 0.20–1.20)
Total Protein: 6.9 g/dL (ref 6.4–8.3)

## 2016-07-28 LAB — CBC WITH DIFFERENTIAL/PLATELET
BASO%: 2 % (ref 0.0–2.0)
BASOS ABS: 0 10*3/uL (ref 0.0–0.1)
EOS%: 9.4 % — AB (ref 0.0–7.0)
Eosinophils Absolute: 0.1 10*3/uL (ref 0.0–0.5)
HEMATOCRIT: 37.9 % (ref 34.8–46.6)
HGB: 12.7 g/dL (ref 11.6–15.9)
LYMPH%: 63.2 % — AB (ref 14.0–49.7)
MCH: 28.1 pg (ref 25.1–34.0)
MCHC: 33.5 g/dL (ref 31.5–36.0)
MCV: 83.9 fL (ref 79.5–101.0)
MONO#: 0.2 10*3/uL (ref 0.1–0.9)
MONO%: 14.9 % — ABNORMAL HIGH (ref 0.0–14.0)
NEUT#: 0.2 10*3/uL — CL (ref 1.5–6.5)
NEUT%: 10.5 % — AB (ref 38.4–76.8)
PLATELETS: 155 10*3/uL (ref 145–400)
RBC: 4.52 10*6/uL (ref 3.70–5.45)
RDW: 13.1 % (ref 11.2–14.5)
WBC: 1.4 10*3/uL — ABNORMAL LOW (ref 3.9–10.3)
lymph#: 0.9 10*3/uL (ref 0.9–3.3)

## 2016-07-28 LAB — DRAW EXTRA CLOT TUBE

## 2016-07-28 MED ORDER — CIPROFLOXACIN HCL 500 MG PO TABS: 500.0000 mg | ORAL_TABLET | Freq: Two times a day (BID) | ORAL | 1 refills | Status: DC

## 2016-07-28 MED FILL — CIPROFLOXACIN HCL 500 MG TA: 500 | 20 days supply | Qty: 40 | Fill #0

## 2016-07-30 LAB — CULTURE, BLOOD (ROUTINE X 2)
CULTURE: NO GROWTH
CULTURE: NO GROWTH
SPECIAL REQUESTS: ADEQUATE
Special Requests: ADEQUATE

## 2016-07-31 ENCOUNTER — Other Ambulatory Visit: Payer: Self-pay | Admitting: *Deleted

## 2016-07-31 DIAGNOSIS — Z171 Estrogen receptor negative status [ER-]: Principal | ICD-10-CM

## 2016-07-31 DIAGNOSIS — C50412 Malignant neoplasm of upper-outer quadrant of left female breast: Secondary | ICD-10-CM

## 2016-08-03 ENCOUNTER — Other Ambulatory Visit: Payer: No Typology Code available for payment source

## 2016-08-03 ENCOUNTER — Ambulatory Visit: Payer: No Typology Code available for payment source

## 2016-08-03 ENCOUNTER — Ambulatory Visit: Payer: Medicaid Other

## 2016-08-03 ENCOUNTER — Other Ambulatory Visit (HOSPITAL_BASED_OUTPATIENT_CLINIC_OR_DEPARTMENT_OTHER): Payer: Medicaid Other

## 2016-08-03 ENCOUNTER — Ambulatory Visit (HOSPITAL_BASED_OUTPATIENT_CLINIC_OR_DEPARTMENT_OTHER): Payer: Medicaid Other

## 2016-08-03 VITALS — BP 131/85 | HR 59 | Temp 98.2°F | Resp 17

## 2016-08-03 DIAGNOSIS — Z171 Estrogen receptor negative status [ER-]: Principal | ICD-10-CM

## 2016-08-03 DIAGNOSIS — C50412 Malignant neoplasm of upper-outer quadrant of left female breast: Secondary | ICD-10-CM

## 2016-08-03 DIAGNOSIS — Z5111 Encounter for antineoplastic chemotherapy: Secondary | ICD-10-CM

## 2016-08-03 DIAGNOSIS — D709 Neutropenia, unspecified: Secondary | ICD-10-CM | POA: Diagnosis not present

## 2016-08-03 DIAGNOSIS — Z5189 Encounter for other specified aftercare: Secondary | ICD-10-CM | POA: Diagnosis not present

## 2016-08-03 DIAGNOSIS — Z95828 Presence of other vascular implants and grafts: Secondary | ICD-10-CM

## 2016-08-03 LAB — COMPREHENSIVE METABOLIC PANEL
ALBUMIN: 3.4 g/dL — AB (ref 3.5–5.0)
ALT: 14 U/L (ref 0–55)
AST: 13 U/L (ref 5–34)
Alkaline Phosphatase: 86 U/L (ref 40–150)
Anion Gap: 8 mEq/L (ref 3–11)
BUN: 8 mg/dL (ref 7.0–26.0)
CALCIUM: 9.7 mg/dL (ref 8.4–10.4)
CHLORIDE: 105 meq/L (ref 98–109)
CO2: 26 meq/L (ref 22–29)
CREATININE: 0.7 mg/dL (ref 0.6–1.1)
EGFR: >90 mL/min/{1.73_m2} (ref >90)
GLUCOSE: 99 mg/dL (ref 70–140)
Potassium: 4.2 mEq/L (ref 3.5–5.1)
Sodium: 139 mEq/L (ref 136–145)
TOTAL PROTEIN: 6.7 g/dL (ref 6.4–8.3)
Total Bilirubin: 0.46 mg/dL (ref 0.20–1.20)

## 2016-08-03 LAB — CBC WITH DIFFERENTIAL/PLATELET
BASO%: 0.7 % (ref 0.0–2.0)
Basophils Absolute: 0 10*3/uL (ref 0.0–0.1)
EOS%: 0.4 % (ref 0.0–7.0)
Eosinophils Absolute: 0 10*3/uL (ref 0.0–0.5)
HEMATOCRIT: 36.7 % (ref 34.8–46.6)
HEMOGLOBIN: 12.3 g/dL (ref 11.6–15.9)
LYMPH#: 1.4 10*3/uL (ref 0.9–3.3)
LYMPH%: 24.6 % (ref 14.0–49.7)
MCH: 28.5 pg (ref 25.1–34.0)
MCHC: 33.5 g/dL (ref 31.5–36.0)
MCV: 85.2 fL (ref 79.5–101.0)
MONO#: 0.5 10*3/uL (ref 0.1–0.9)
MONO%: 8.1 % (ref 0.0–14.0)
NEUT%: 66.2 % (ref 38.4–76.8)
NEUTROS ABS: 3.8 10*3/uL (ref 1.5–6.5)
NRBC: 0 % (ref 0–0)
Platelets: 138 10*3/uL — ABNORMAL LOW (ref 145–400)
RBC: 4.31 10*6/uL (ref 3.70–5.45)
RDW: 13 % (ref 11.2–14.5)
WBC: 5.7 10*3/uL (ref 3.9–10.3)

## 2016-08-03 MED ORDER — SODIUM CHLORIDE 0.9 % IV SOLN
Freq: Once | INTRAVENOUS | Status: AC
  Administered 2016-08-03: 16:00:00 via INTRAVENOUS
  Filled 2016-08-03: qty 5

## 2016-08-03 MED ORDER — PALONOSETRON HCL INJECTION 0.25 MG/5ML
0.2500 mg | Freq: Once | INTRAVENOUS | Status: AC
  Administered 2016-08-03: 0.25 mg via INTRAVENOUS

## 2016-08-03 MED ORDER — SODIUM CHLORIDE 0.9 % IV SOLN
Freq: Once | INTRAVENOUS | Status: AC
  Administered 2016-08-03: 16:00:00 via INTRAVENOUS

## 2016-08-03 MED ORDER — DOXORUBICIN HCL CHEMO IV INJECTION 2 MG/ML
60.0000 mg/m2 | Freq: Once | INTRAVENOUS | Status: AC
Start: 2016-08-03 — End: 2016-08-03
  Administered 2016-08-03: 142 mg via INTRAVENOUS
  Filled 2016-08-03: qty 71

## 2016-08-03 MED ORDER — PALONOSETRON HCL INJECTION 0.25 MG/5ML
INTRAVENOUS | Status: AC
  Filled 2016-08-03: qty 5

## 2016-08-03 MED ORDER — CYCLOPHOSPHAMIDE CHEMO INJECTION 1 GM
600.0000 mg/m2 | Freq: Once | INTRAMUSCULAR | Status: AC
  Administered 2016-08-03: 1420 mg via INTRAVENOUS
  Filled 2016-08-03: qty 71

## 2016-08-03 MED ORDER — SODIUM CHLORIDE 0.9% FLUSH
10.0000 mL | INTRAVENOUS | Status: DC | PRN
  Filled 2016-08-03: qty 10

## 2016-08-03 MED ORDER — HEPARIN SOD (PORK) LOCK FLUSH 100 UNIT/ML IV SOLN
500.0000 [IU] | Freq: Once | INTRAVENOUS | Status: DC | PRN
  Filled 2016-08-03: qty 5

## 2016-08-03 MED ORDER — PEGFILGRASTIM 6 MG/0.6ML ~~LOC~~ PSKT
6.0000 mg | PREFILLED_SYRINGE | Freq: Once | SUBCUTANEOUS | Status: AC
  Administered 2016-08-03: 6 mg via SUBCUTANEOUS
  Filled 2016-08-03: qty 0.6

## 2016-08-03 NOTE — Patient Instructions (Signed)

## 2016-08-03 NOTE — Patient Instructions (Signed)
[facility] Discharge Instructions for Patients Receiving Chemotherapy  Today you received the following chemotherapy agents Doxorubicin, Cytoxan.   To help prevent nausea and vomiting after your treatment, we encourage you to take your nausea medication as prescribed.    If you develop nausea and vomiting that is not controlled by your nausea medication, call the clinic.   BELOW ARE SYMPTOMS THAT SHOULD BE REPORTED IMMEDIATELY:  *FEVER GREATER THAN 100.5 F  *CHILLS WITH OR WITHOUT FEVER  NAUSEA AND VOMITING THAT IS NOT CONTROLLED WITH YOUR NAUSEA MEDICATION  *UNUSUAL SHORTNESS OF BREATH  *UNUSUAL BRUISING OR BLEEDING  TENDERNESS IN MOUTH AND THROAT WITH OR WITHOUT PRESENCE OF ULCERS  *URINARY PROBLEMS  *BOWEL PROBLEMS  UNUSUAL RASH Items with * indicate a potential emergency and should be followed up as soon as possible.  Feel free to call the clinic you have any questions or concerns. The clinic phone number is [phone].  Please show the CHEMO ALERT CARD at check-in to the Emergency Department and triage nurse.  

## 2016-08-04 ENCOUNTER — Encounter: Payer: Self-pay | Admitting: Genetics

## 2016-08-04 ENCOUNTER — Ambulatory Visit (HOSPITAL_BASED_OUTPATIENT_CLINIC_OR_DEPARTMENT_OTHER): Payer: Medicaid Other | Admitting: Genetics

## 2016-08-04 ENCOUNTER — Other Ambulatory Visit: Payer: Medicaid Other

## 2016-08-04 DIAGNOSIS — Z803 Family history of malignant neoplasm of breast: Secondary | ICD-10-CM | POA: Diagnosis not present

## 2016-08-04 DIAGNOSIS — C50412 Malignant neoplasm of upper-outer quadrant of left female breast: Secondary | ICD-10-CM

## 2016-08-04 DIAGNOSIS — Z7183 Encounter for nonprocreative genetic counseling: Secondary | ICD-10-CM

## 2016-08-04 DIAGNOSIS — C50912 Malignant neoplasm of unspecified site of left female breast: Secondary | ICD-10-CM

## 2016-08-04 DIAGNOSIS — Z171 Estrogen receptor negative status [ER-]: Principal | ICD-10-CM

## 2016-08-04 DIAGNOSIS — Z800 Family history of malignant neoplasm of digestive organs: Secondary | ICD-10-CM | POA: Diagnosis not present

## 2016-08-04 NOTE — Progress Notes (Signed)
REFERRING PROVIDER: Chauncey Cruel, MD [address] [place], [place] [address]  PRIMARY PROVIDER:  [name], MD  PRIMARY REASON FOR VISIT:  1. Malignant neoplasm of left breast in female, estrogen receptor negative, unspecified site of breast ([place])   2. Family history of breast cancer   3. Family history of colon cancer      HISTORY OF PRESENT ILLNESS:   Ms. Cherian, a 46 y.o. female, was seen for a [place] cancer genetics consultation at the request of [name] due to a personal and family history of cancer.  Ms. Moylan presents to clinic today to discuss the possibility of a hereditary predisposition to cancer, genetic testing, and to further clarify her future cancer risks, as well as potential cancer risks for family members. Ms. Errickson is accompanied to her appointment by her 57 year old daughter, Sandie Ano.  CANCER HISTORY: In June 2018, at the age of 15, Ms. 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 She is currently undergoing neoadjuvant chemotherapy.   Past Medical History:  Diagnosis Date  . Malignant neoplasm of upper-outer quadrant of left breast in female, estrogen receptor negative ([place]) 06/23/2016    Past Surgical History:  Procedure Laterality Date  . ANKLE SURGERY    . IR FLUORO GUIDE PORT INSERTION RIGHT  07/14/2016  . IR US GUIDE VASC ACCESS RIGHT  07/14/2016  . TUBAL LIGATION      Social History   Social History  . Marital status: Married    Spouse name: N/A  . Number of children: N/A  . Years of education: N/A   Social History Main Topics  . Smoking status: Never Smoker  . Smokeless tobacco: Never Used  . Alcohol use No  . Drug use: No  . Sexual activity: Not on file   Other Topics Concern  . Not on file   Social History Narrative  . No narrative on file     FAMILY HISTORY:  We obtained a detailed, 4-generation family history.  Significant diagnoses are listed  below: Family History  Problem Relation Age of Onset  . Diabetes Mother   . Hypertension Mother   . Arthritis Mother   . Colon cancer Father 59       d.76 metastatic at time of diagnosis  . Diabetes Father   . Heart disease Father   . Hypertension Father   . Breast cancer Maternal Grandmother 40       d.60s  . Breast cancer Maternal Aunt 52  . Cervical cancer Maternal Aunt 68  . Breast cancer Maternal Aunt 46       d.50s  . Cancer Maternal Uncle        d.62s unspecified type of cancer  . Breast cancer Cousin 58       paternal first-cousin (daughter of unaffected aunt)   Ms. Matsunaga has three daughters, ages 65, 12, and 56, and one son, age 71. None have had cancers or tumors. Ms. Knotek has a brother and a sister. They are twins to each other and are currently 29. Neither have had cancer, though she notes her sister has a long history of abnormal pap tests.  Ms. Wirtanen mother is 24 without cancers. Ms. Christensen had four maternal uncles and six maternal aunts who lived to adulthood. Two maternal aunts died as infants. All of her uncles are deceased. One had an unspecified form of cancer and died in his early-60s. All but one of her aunts is  deceased. Her only living aunt, [name], had breast cancer in her early-50s and cervical cancer shortly after. [name] is currently 21. Another aunt, [name], was diagnosed with breast cancer at 57 and died in her 76s. The rest of the aunts did not have cancer. Ms. Sweetland maternal grandmother was diagnosed with breast cancer at 63 and died in her 47s. Ms. Bisher maternal grandfather left the family when her mother was very young, so she has no further information about him.  Ms. Garrison father recently died from colon cancer at age 47. She reports that his cancer was metastatic at the time of diagnosis and her father refused treatment. Ms. Raval only paternal uncle died at birth. Her only paternal aunt is 57 without cancers. This aunt's  daughter, June, was diagnosed with breast cancer around the age of 19 and is currently in her late-50s. Ms. Youngren paternal grandmother died at 15 without cancer. Her paternal grandfather died at 69 without cancer. This grandfather's mother had tongue cancer and died in her 81s.  Ms. Moroz is unaware of previous family history of genetic testing for hereditary cancer risks. Patient's maternal ancestors are of Caucasian and Native American descent, and paternal ancestors are of Caucasian descent. There is no reported Ashkenazi Jewish ancestry. There is no known consanguinity.  GENETIC COUNSELING ASSESSMENT: Phoebie Shad is a 46 y.o. female with a personal and family history which is somewhat suggestive of a hereditary cancer syndrome and predisposition to cancer. We, therefore, discussed and recommended the following at today's visit.   DISCUSSION: We reviewed the characteristics, features and inheritance patterns of hereditary cancer syndromes. We also discussed genetic testing, including the appropriate family members to test, the process of testing, insurance coverage and turn-around-time for results. We discussed the implications of a negative, positive and/or variant of uncertain significant result. We recommended Ms. Zeiger pursue genetic testing for the Common Hereditary Cancers Panel offered by [facility]. [facility]'s Common Hereditary Cancers Panel includes analysis of the following 46 genes: APC, ATM, AXIN2, BARD1, BMPR1A, BRCA1, BRCA2, BRIP1, CDH1, CDKN2A, CHEK2, CTNNA1, DICER1, EPCAM, GREM1, HOXB13, KIT, MEN1, MLH1, MSH2, MSH3, MSH6, MUTYH, NBN, NF1, NTHL1, PALB2, PDGFRA, PMS2, POLD1, POLE, PTEN, RAD50, RAD51C, RAD51D, SDHA, SDHB, SDHC, SDHD, SMAD4, SMARCA4, STK11, TP53, TSC1, TSC2, and VHL.   Based on Ms. Puccinelli's personal and family history of cancer, she meets medical criteria for genetic testing. Given previous experience with genetic testing performed through [facility] for individuals  with [facility], Ms. Duling should have no out-of-pocket expense for her genetic testing.   PLAN: After considering the risks, benefits, and limitations, Ms. Linzy  provided informed consent to pursue genetic testing and the blood sample was sent to [facility] for analysis of the 46-gene Common Hereditary Cancers Panel. Results should be available within approximately 3 weeks' time, at which point they will be disclosed by telephone to Ms. Key, as will any additional recommendations warranted by these results. This information will also be available in Epic.   Lastly, we encouraged Ms. Gren to remain in contact with cancer genetics annually so that we can continuously update the family history and inform her of any changes in cancer genetics and testing that may be of benefit for this family.   Ms.  Gunnels questions were answered to her satisfaction today. Our contact information was provided should additional questions or concerns arise. Thank you for the referral and allowing [place] to share in the care of your patient.   Mal Misty, MS, [facility]  [email or web address] phone: [phone]  The patient was seen for a total of 50 minutes in face-to-face genetic counseling.  This patient was discussed with Drs. Magrinat, Lindi Adie and/or Burr Medico who agrees with the above.    _______________________________________________________________________ For Office Staff:  Number of people involved in session: 2 Was an Intern/ student involved with case: no

## 2016-08-05 ENCOUNTER — Encounter (HOSPITAL_COMMUNITY): Payer: Self-pay

## 2016-08-05 ENCOUNTER — Telehealth: Payer: Self-pay

## 2016-08-05 ENCOUNTER — Ambulatory Visit (HOSPITAL_COMMUNITY)
Admission: RE | Admit: 2016-08-05 | Discharge: 2016-08-05 | Disposition: A | Discharge status: Home / Self Care | Payer: Medicaid Other | Source: Ambulatory Visit | Attending: Internal Medicine | Admitting: Internal Medicine

## 2016-08-05 DIAGNOSIS — R930 Abnormal findings on diagnostic imaging of skull and head, not elsewhere classified: Secondary | ICD-10-CM | POA: Diagnosis not present

## 2016-08-05 DIAGNOSIS — I6523 Occlusion and stenosis of bilateral carotid arteries: Secondary | ICD-10-CM | POA: Diagnosis not present

## 2016-08-05 DIAGNOSIS — R51 Headache: Secondary | ICD-10-CM | POA: Diagnosis present

## 2016-08-05 DIAGNOSIS — Z171 Estrogen receptor negative status [ER-]: Secondary | ICD-10-CM | POA: Insufficient documentation

## 2016-08-05 DIAGNOSIS — C50912 Malignant neoplasm of unspecified site of left female breast: Secondary | ICD-10-CM

## 2016-08-05 NOTE — Telephone Encounter (Signed)
Diane from [facility] radiology called for  Patient report for pt CT results

## 2016-08-07 ENCOUNTER — Telehealth: Payer: Self-pay | Admitting: Internal Medicine

## 2016-08-07 ENCOUNTER — Encounter: Payer: Self-pay | Admitting: Internal Medicine

## 2016-08-07 DIAGNOSIS — R9389 Abnormal findings on diagnostic imaging of other specified body structures: Secondary | ICD-10-CM

## 2016-08-07 MED FILL — PROCHLORPERAZINE 10 MG TAB: 10 | 7 days supply | Qty: 30 | Fill #1

## 2016-08-07 MED FILL — DEXAMETHASONE 4 MG TABLET: 4 | 7 days supply | Qty: 30 | Fill #1

## 2016-08-07 NOTE — Telephone Encounter (Signed)
Patient concern

## 2016-08-07 NOTE — Telephone Encounter (Signed)
Phone call placed to patient this a.m. on home prone and mobile. No answer on the home phone. I got voicemail on mobile phone but did not leave a message. I will send patient a my chart message letting her know the results of the CAT scan of the brain and the need for an MRI.

## 2016-08-08 NOTE — Telephone Encounter (Signed)
I was able to speak with pt later yesterday after she responded to the Mychart message I had sent. We discussed the results and need for MRI with and without contrast. Pt is concerned and willing to move forward with imaging.  I will also send message to her oncologist to make him aware of CT scan head findings.

## 2016-08-10 ENCOUNTER — Other Ambulatory Visit: Payer: Self-pay

## 2016-08-10 DIAGNOSIS — C50412 Malignant neoplasm of upper-outer quadrant of left female breast: Secondary | ICD-10-CM

## 2016-08-10 DIAGNOSIS — Z171 Estrogen receptor negative status [ER-]: Principal | ICD-10-CM

## 2016-08-10 MED FILL — traZODone HCL 50 MG TABS: 50 | 30 days supply | Qty: 30 | Fill #1

## 2016-08-10 NOTE — Telephone Encounter (Signed)
Contacted pt and made aware of appointment for MRI is scheduled for August 14, 2016 @12pm  but is to arrive at 70 @ [facility] pt is aware and doesn't have any questions or concerns

## 2016-08-11 ENCOUNTER — Telehealth: Payer: Self-pay

## 2016-08-11 ENCOUNTER — Other Ambulatory Visit (HOSPITAL_BASED_OUTPATIENT_CLINIC_OR_DEPARTMENT_OTHER): Payer: Medicaid Other

## 2016-08-11 ENCOUNTER — Ambulatory Visit (HOSPITAL_BASED_OUTPATIENT_CLINIC_OR_DEPARTMENT_OTHER): Payer: Medicaid Other | Admitting: Oncology

## 2016-08-11 VITALS — BP 120/75 | HR 103 | Temp 98.6°F | Resp 18 | Ht 67.5 in | Wt 253.4 lb

## 2016-08-11 DIAGNOSIS — Z171 Estrogen receptor negative status [ER-]: Secondary | ICD-10-CM | POA: Diagnosis not present

## 2016-08-11 DIAGNOSIS — C773 Secondary and unspecified malignant neoplasm of axilla and upper limb lymph nodes: Secondary | ICD-10-CM | POA: Diagnosis not present

## 2016-08-11 DIAGNOSIS — C50412 Malignant neoplasm of upper-outer quadrant of left female breast: Secondary | ICD-10-CM

## 2016-08-11 LAB — CBC WITH DIFFERENTIAL/PLATELET
BASO%: 3.4 % — ABNORMAL HIGH (ref 0.0–2.0)
Basophils Absolute: 0 10*3/uL (ref 0.0–0.1)
EOS ABS: 0 10*3/uL (ref 0.0–0.5)
EOS%: 1.9 % (ref 0.0–7.0)
HEMATOCRIT: 36.5 % (ref 34.8–46.6)
HEMOGLOBIN: 12.3 g/dL (ref 11.6–15.9)
LYMPH#: 0.6 10*3/uL — AB (ref 0.9–3.3)
LYMPH%: 70.7 % — AB (ref 14.0–49.7)
MCH: 28.1 pg (ref 25.1–34.0)
MCHC: 33.6 g/dL (ref 31.5–36.0)
MCV: 83.6 fL (ref 79.5–101.0)
MONO#: 0.1 10*3/uL (ref 0.1–0.9)
MONO%: 15.4 % — ABNORMAL HIGH (ref 0.0–14.0)
NEUT%: 8.6 % — ABNORMAL LOW (ref 38.4–76.8)
NEUTROS ABS: 0.1 10*3/uL — AB (ref 1.5–6.5)
PLATELETS: 120 10*3/uL — AB (ref 145–400)
RBC: 4.36 10*6/uL (ref 3.70–5.45)
RDW: 13.3 % (ref 11.2–14.5)
WBC: 0.9 10*3/uL — AB (ref 3.9–10.3)

## 2016-08-11 LAB — COMPREHENSIVE METABOLIC PANEL
ALBUMIN: 3.3 g/dL — AB (ref 3.5–5.0)
ALK PHOS: 89 U/L (ref 40–150)
ALT: 13 U/L (ref 0–55)
ANION GAP: 9 meq/L (ref 3–11)
AST: 12 U/L (ref 5–34)
BILIRUBIN TOTAL: 0.58 mg/dL (ref 0.20–1.20)
BUN: 8 mg/dL (ref 7.0–26.0)
CALCIUM: 9.2 mg/dL (ref 8.4–10.4)
CO2: 26 mEq/L (ref 22–29)
CREATININE: 0.7 mg/dL (ref 0.6–1.1)
Chloride: 102 mEq/L (ref 98–109)
Glucose: 168 mg/dl — ABNORMAL HIGH (ref 70–140)
Potassium: 3.8 mEq/L (ref 3.5–5.1)
Sodium: 137 mEq/L (ref 136–145)
TOTAL PROTEIN: 6.7 g/dL (ref 6.4–8.3)

## 2016-08-11 LAB — TECHNOLOGIST REVIEW

## 2016-08-11 MED ORDER — DEXAMETHASONE 4 MG PO TABS: 4.0000 mg | ORAL_TABLET | Freq: Two times a day (BID) | ORAL | 1 refills | Status: DC

## 2016-08-11 MED ORDER — POLYETHYLENE GLYCOL 3350 17 G PO PACK: 17.0000 g | PACK | Freq: Every day | ORAL | 0 refills | Status: DC

## 2016-08-11 NOTE — Progress Notes (Signed)
[place]  [phone] [phone]     ID: [name] DOB: [birth year]  MR#: [id number]  WER#:[id number]  Patient Care Team: [name], MD as PCP - General (Internal Medicine) [name], MD as Consulting Physician (General Surgery) Jenny Lai, [name], MD as Consulting Physician (Oncology) [name], MD as Attending Physician (Radiation Oncology) Chauncey Cruel, MD OTHER MD:  CHIEF COMPLAINT: Triple negative breast cancer  CURRENT TREATMENT: Neoadjuvant chemotherapy  INTERVAL HISTORY: [name] returns today for follow-up and treatment of her triple negative breast cancer, accompanied by her son [name]. Today is day 9 cycle 2 of 4 planned cycles of cyclophosphamide and doxorubicin, to be followed by paclitaxel and carboplatin.   [name] did much better with cycle 2. Decreasing the day 2 and 3 steroids and extending the steroids for 2 additional days made an enormous difference. The biggest problem she had was continuing constipation, despite taking Colace 2 tablets twice daily. She had stopped MiraLAX however. She tried apple juice and other remedies but was very uncomfortable for several days and when she did have a bowel movement he was very hard and "tore my bottom up".  [name] is very excited because she says her left breast is "now just the way used to be before".  REVIEW OF SYSTEMS: She had significantly less bone pain with the second dose of OnPro. A detailed review of systems today was otherwise noncontributory  BREAST CANCER HISTORY: From the original intake note:  The patient was seen in the emergency room 05/16/2016 with nonspecific complaints but also noting that she had a tender lump in her lateral left breast which she said she had noted the day before. Exam by the emergency room physician confirmed a 3 cm soft left lateral breast mass without overlying erythema or nipple changes. This was felt to be most consistent with  fibrocystic change but the patient was referred back to her primary care physician for further evaluation. She saw the physician assistant in May 18 and then Dr. Wynetta Emery on 06/12/2016 who scheduled a bilateral diagnostic mammography with tomography and left breast ultrasonography at the [facility] 06/18/2016. This found the breast density to be category B. In the left breast upper outer quadrant there was an area of asymmetry measuring up to 10.3 cm. On exam this was firm and palpable and on ultrasound there was an irregular mass measuring 2.5 cm with some subtle changes in the surrounding tissue. In the left lower axilla there were 2 suspicious-looking lymph nodes.  Biopsy of the left breast upper outer quadrant and one of the suspicious lymph nodes 06/19/2016 showed ([id number]) both to be involved by invasive ductal carcinoma, grade 3, estrogen and progesterone receptor negative, HER-2 not amplified with a signals ratio being 1.53-1.74 and the number per cell 2.35-3.05. The MIB-1 was 90%.  The patient's subsequent history is as detailed below.   PAST MEDICAL HISTORY: Past Medical History:  Diagnosis Date  . Malignant neoplasm of upper-outer quadrant of left breast in female, estrogen receptor negative ([place]) 06/23/2016    PAST SURGICAL HISTORY: Past Surgical History:  Procedure Laterality Date  . ANKLE SURGERY    . IR FLUORO GUIDE PORT INSERTION RIGHT  07/14/2016  . IR US GUIDE VASC ACCESS RIGHT  07/14/2016  . TUBAL LIGATION      FAMILY HISTORY Family History  Problem Relation Age of Onset  . Diabetes Mother   . Hypertension Mother   . Arthritis Mother   . Colon cancer Father 74  d.76 metastatic at time of diagnosis  . Diabetes Father   . Heart disease Father   . Hypertension Father   . Breast cancer Maternal Grandmother 57       d.60s  . Breast cancer Maternal Aunt 52  . Cervical cancer Maternal Aunt 43  . Breast cancer Maternal Aunt 46       d.50s  . Cancer Maternal  Uncle        d.62s unspecified type of cancer  . Breast cancer Cousin 68       paternal first-cousin (daughter of unaffected aunt)  The patient's father died at age 78 from colon cancer which she never had treated. The patient's mother is living, 91 years old as of June 2018. The patient has one brother, one sister. On the mother's side there is a history of breast cancer in the grandmother, age 35, and then 1 aunt with breast cancer age 65, and another with lung cancer age 35. There is no history of ovarian cancer in the family.  GYNECOLOGIC HISTORY:  No LMP recorded. Menarche age 41, first live birth age 66. She is GX P4. Her periods have never been regular. She had a period in June 2018 but not for 4 months before that. She took birth control for some years remotely with no complications. She is status post bilateral tubal ligation.  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 Her husband [name] (goes by "[name]") is originally from [name]. The patient's children from her first marriage are [name] 47 years old (all ages as of June 2018) recently in the [employer] but now taking courses sent rocking him immunity college, and [name], who is studying psychology at [name]. Together with [name] the patient's children are [name] and hila, aged 32 and 55.     ADVANCED DIRECTIVES: Not in place   HEALTH MAINTENANCE: Social History  Substance Use Topics  . Smoking status: Never Smoker  . Smokeless tobacco: Never Used  . Alcohol use No     Colonoscopy:Never   VOJ:JKKX 2018  Bone density:Never   No Known Allergies  Current Outpatient Prescriptions  Medication Sig Dispense Refill  . acetaminophen (TYLENOL) 500 MG tablet Take 1,000 mg by mouth every 6 (six) hours as needed. For  fever/pain    . ciprofloxacin (CIPRO) 500 MG tablet Take 1 tablet (500 mg total) by mouth 2 (two) times daily. 40 tablet 1  . dexamethasone (DECADRON) 4 MG tablet Take 2 tablets by mouth once a day on the day after chemotherapy and then take 2 tablets two times a day for 2 days. Take with food. 30 tablet 1  . docusate sodium (COLACE) 100 MG capsule Take 1 capsule (100 mg total) by mouth 2 (two) times daily. 60 capsule 2  . fluticasone (FLONASE) 50 MCG/ACT nasal spray Place 2 sprays into the nose daily. 16 g 0  . hydrochlorothiazide (HYDRODIURIL) 25 MG tablet Take 0.5 tablets (12.5 mg total) by mouth daily. 45 tablet 3  . lidocaine-prilocaine (EMLA) cream Apply to affected area once 30 g 3  . loratadine (CLARITIN) 10 MG tablet Take 1 tablet (10 mg total) by mouth daily. 90 tablet 1  . LORazepam (ATIVAN) 0.5 MG tablet Take 1 tablet (0.5 mg total) by mouth at bedtime as needed (Nausea or  vomiting). 30 tablet 0  . prochlorperazine (COMPAZINE) 10 MG tablet Take 1 tablet (10 mg total) by mouth every 6 (six) hours as needed (Nausea or vomiting). 30 tablet 1  . traZODone (DESYREL) 50 MG tablet Take 0.5-1 tablets (25-50 mg total) by mouth at bedtime as needed for sleep. 30 tablet 3   No current facility-administered medications for this visit.     OBJECTIVE: Middle-aged white womanWho appears stated age  46:   08/11/16 1231  BP: 120/75  Pulse: (!) 103  Resp: 18  Temp: 98.6 F (37 C)     Body mass index is 39.1 kg/m.   Filed Weights   08/11/16 1231  Weight: 253 lb 6.4 oz (114.9 kg)       ECOG FS:1 - Symptomatic but completely ambulatory  Sclerae unicteric, EOMs intact Oropharynx clear and moist No cervical or supraclavicular adenopathy Lungs no rales or rhonchi Heart regular rate and rhythm Abd soft, nontender, positive bowel sounds MSK no focal spinal tenderness, no upper extremity lymphedema Neuro: nonfocal, well oriented, appropriate affect Breasts: The right breast is benign.  The left breast currently is unremarkable. There are no palpable masses or skin changes and no nipple changes. Both axillae are benign.  Left breast on 08/11/2016     LAB RESULTS:  CMP     Component Value Date/Time   NA 137 08/11/2016 1156   K 3.8 08/11/2016 1156   CL 99 (L) 07/25/2016 1830   CO2 26 08/11/2016 1156   GLUCOSE 168 (H) 08/11/2016 1156   BUN 8.0 08/11/2016 1156   CREATININE 0.7 08/11/2016 1156   CALCIUM 9.2 08/11/2016 1156   PROT 6.7 08/11/2016 1156   ALBUMIN 3.3 (L) 08/11/2016 1156   AST 12 08/11/2016 1156   ALT 13 08/11/2016 1156   ALKPHOS 89 08/11/2016 1156   BILITOT 0.58 08/11/2016 1156   GFRNONAA >60 07/25/2016 1830   GFRAA >60 07/25/2016 1830    No results found for: TOTALPROTELP, ALBUMINELP, A1GS, A2GS, BETS, BETA2SER, GAMS, MSPIKE, SPEI  No results found for: Nils Pyle, [facility]  Lab Results  Component Value Date   WBC 0.9 (LL) 08/11/2016   NEUTROABS 0.1 (LL) 08/11/2016   HGB 12.3 08/11/2016   HCT 36.5 08/11/2016   MCV 83.6 08/11/2016   PLT 120 (L) 08/11/2016      Chemistry      Component Value Date/Time   NA 137 08/11/2016 1156   K 3.8 08/11/2016 1156   CL 99 (L) 07/25/2016 1830   CO2 26 08/11/2016 1156   BUN 8.0 08/11/2016 1156   CREATININE 0.7 08/11/2016 1156      Component Value Date/Time   CALCIUM 9.2 08/11/2016 1156   ALKPHOS 89 08/11/2016 1156   AST 12 08/11/2016 1156   ALT 13 08/11/2016 1156   BILITOT 0.58 08/11/2016 1156       No results found for: LABCA2  No components found for: [id number]  No results for input(s): INR in the last 168 hours.  Urinalysis    Component Value Date/Time   COLORURINE AMBER (A) 05/16/2016 1709   APPEARANCEUR CLOUDY (A) 05/16/2016 1709   LABSPEC 1.023 05/16/2016 1709   PHURINE 5.5 05/16/2016 1709   GLUCOSEU NEGATIVE 05/16/2016 1709   HGBUR NEGATIVE 05/16/2016 1709   BILIRUBINUR NEGATIVE 05/16/2016 1709   KETONESUR NEGATIVE 05/16/2016 1709   PROTEINUR NEGATIVE  05/16/2016 1709   UROBILINOGEN 0.2 01/04/2010 1911   NITRITE NEGATIVE 05/16/2016 1709   LEUKOCYTESUR NEGATIVE 05/16/2016 1709     STUDIES: Ct Head  Wo Contrast  Result Date: 08/05/2016 CLINICAL DATA:  Headache for 2 weeks. Currently receiving chemotherapy for breast carcinoma EXAM: CT HEAD WITHOUT CONTRAST TECHNIQUE: Contiguous axial images were obtained from the base of the skull through the vertex without intravenous contrast. COMPARISON:  None. FINDINGS: Brain: Ventricles are normal in size and configuration. Mild prominence of the cisterna magna is an anatomic variant. There is no intracranial mass, hemorrhage, midline shift, or extra-axial fluid collection apparent. Decreased attenuation is seen throughout mid pons region. Elsewhere gray-white compartments appear normal. Vascular: No hyperdense vessel. There is modest calcification in the cavernous carotid artery regions bilaterally. Skull: The bony calvarium appears intact. There is symmetric somewhat ground-glass type appearance along each inferior orbital floor. Sinuses/Orbits: There is mild mucosal thickening in several ethmoid air cells bilaterally. Other visualized paranasal sinuses are clear. Orbits appear symmetric bilaterally. Other: Mastoid air cells are clear. IMPRESSION: 1. Decreased attenuation is seen in the mid pons region. Elsewhere gray-white compartments appear normal. 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 This entity is usually seen following rapid correction of hyponatremia. It has been seen associated with chemotherapy on occasion, however. Given this appearance, correlation with brain MRI pre and post-contrast is advised. This area conceivably could represent edema from a focal metastasis in this area. 2. Elsewhere gray-white compartments appear normal. No mass is seen on noncontrast enhanced study. No hemorrhage. No extra-axial fluid. 3.  Slight  calcification in each cavernous carotid artery. 4.  Mild mucosal thickening in each ethmoid air cell. 5. Essentially symmetric ground-glass type opacity in each orbital floor region. The appearance of the bone in this area is atypical for metastatic disease. 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 Particular attention to these areas on MR advised. These results will be called to the ordering clinician or representative by the Radiologist Assistant, and communication documented in the PACS or zVision Dashboard. Electronically Signed   By: Lowella Grip III M.D.   On: 08/05/2016 16:05   Ct Chest W Contrast  Result Date: 07/25/2016 CLINICAL DATA:  Known left breast cancer with increased swelling and heaviness. [place]. EXAM: CT CHEST WITH CONTRAST TECHNIQUE: Multidetector CT imaging of the chest was performed during intravenous contrast administration. CONTRAST:  121m ISOVUE-300 IOPAMIDOL (ISOVUE-300) INJECTION 61% COMPARISON:  07/06/2016 FINDINGS: Cardiovascular: Normal heart size. No pericardial effusion. No acute vascular finding. Mediastinum/Nodes: Negative for thoracic adenopathy. 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 The superficial left axillary node is essentially size stable at 17 mm short axis. There is greater coverage of the left breast compared to prior. A mass with calcification is seen in the outer left breast. This skin is diffusely thicker over the left breast when compared to prior. This may reflect inflammatory cancer, but did develop fairly rapidly and infection is not excluded. Per EMR, no radiation has been performed. Reportedly there is concern for abscess. No abscess is present. Lungs/Pleura: There is no edema, consolidation, effusion, or pneumothorax. Negative for nodule Upper Abdomen: No acute or aggressive finding. Cholelithiasis, known from prior. Musculoskeletal: No acute or aggressive finding. IMPRESSION: Skin  thickening over the left breast which is new/ progressed since 07/06/2016. In the setting of ipsilateral breast cancer with axillary adenopathy, this is primarily concerning for inflammatory cancer, but did develop rapidly and needs correlation for infectious symptoms. There was clinical concern for abscess, which is absent. Electronically Signed   By: JMonte FantasiaM.D.   On: 07/25/2016  20:32   Ir US Guide Vasc Access Right  Result Date: 07/14/2016 CLINICAL DATA:  Left breast carcinoma common the durable venous access for chemotherapy regimen. EXAM: TUNNELED PORT CATHETER PLACEMENT WITH ULTRASOUND AND FLUOROSCOPIC GUIDANCE FLUOROSCOPY TIME:  0.1 minute, 40uGym2 DAP ANESTHESIA/SEDATION: Intravenous Fentanyl and Versed were administered as conscious sedation during continuous monitoring of the patient's level of consciousness and physiological / cardiorespiratory status by the radiology RN, with a total moderate sedation time of 22 minutes. TECHNIQUE: The procedure, risks, benefits, and alternatives were explained to the patient. Questions regarding the procedure were encouraged and answered. The patient understands and consents to the procedure. As antibiotic prophylaxis, cefazolin 2 g was ordered pre-procedure and administered intravenously within one hour of incision. Patency of the right IJ vein was confirmed with ultrasound with image documentation. An appropriate skin site was determined. Skin site was marked. Region was prepped using maximum barrier technique including cap and mask, sterile gown, sterile gloves, large sterile sheet, and Chlorhexidine as cutaneous antisepsis. The region was infiltrated locally with 1% lidocaine. Under real-time ultrasound guidance, the right IJ vein was accessed with a 21 gauge micropuncture needle; the needle tip within the vein was confirmed with ultrasound image documentation. Needle was exchanged over a 018 guidewire for transitional dilator which allowed passage of  the [facility] wire into the IVC. Over this, the transitional dilator was exchanged for a 5 [place] MPA catheter. A small incision was made on the right anterior chest wall and a subcutaneous pocket fashioned. The power-injectable port was positioned and its catheter tunneled to the right IJ dermatotomy site. The MPA catheter was exchanged over an Amplatz wire for a peel-away sheath, through which the port catheter, which had been trimmed to the appropriate length, was advanced and positioned under fluoroscopy with its tip at the cavoatrial junction. Spot chest radiograph confirms good catheter position and no pneumothorax. The pocket was closed with deep interrupted and subcuticular continuous 3-0 Monocryl sutures. The port was flushed per protocol. The incisions were covered with Dermabond then covered with a sterile dressing. COMPLICATIONS: COMPLICATIONS None immediate IMPRESSION: Technically successful right IJ power-injectable port catheter placement. Ready for routine use. Electronically Signed   By: Lucrezia Europe M.D.   On: 07/14/2016 12:43   Ir Fluoro Guide Port Insertion Right  Result Date: 07/14/2016 CLINICAL DATA:  Left breast carcinoma common the durable venous access for chemotherapy regimen. EXAM: TUNNELED PORT CATHETER PLACEMENT WITH ULTRASOUND AND FLUOROSCOPIC GUIDANCE FLUOROSCOPY TIME:  0.1 minute, 40uGym2 DAP ANESTHESIA/SEDATION: Intravenous Fentanyl and Versed were administered as conscious sedation during continuous monitoring of the patient's level of consciousness and physiological / cardiorespiratory status by the radiology RN, with a total moderate sedation time of 22 minutes. TECHNIQUE: The procedure, risks, benefits, and alternatives were explained to the patient. Questions regarding the procedure were encouraged and answered. The patient understands and consents to the procedure. As antibiotic prophylaxis, cefazolin 2 g was ordered pre-procedure and administered intravenously within one hour  of incision. Patency of the right IJ vein was confirmed with ultrasound with image documentation. An appropriate skin site was determined. Skin site was marked. Region was prepped using maximum barrier technique including cap and mask, sterile gown, sterile gloves, large sterile sheet, and Chlorhexidine as cutaneous antisepsis. The region was infiltrated locally with 1% lidocaine. Under real-time ultrasound guidance, the right IJ vein was accessed with a 21 gauge micropuncture needle; the needle tip within the vein was confirmed with ultrasound image documentation. Needle was exchanged over a 018 guidewire for  transitional dilator which allowed passage of the [facility] wire into the IVC. Over this, the transitional dilator was exchanged for a 5 [place] MPA catheter. A small incision was made on the right anterior chest wall and a subcutaneous pocket fashioned. The power-injectable port was positioned and its catheter tunneled to the right IJ dermatotomy site. The MPA catheter was exchanged over an Amplatz wire for a peel-away sheath, through which the port catheter, which had been trimmed to the appropriate length, was advanced and positioned under fluoroscopy with its tip at the cavoatrial junction. Spot chest radiograph confirms good catheter position and no pneumothorax. The pocket was closed with deep interrupted and subcuticular continuous 3-0 Monocryl sutures. The port was flushed per protocol. The incisions were covered with Dermabond then covered with a sterile dressing. COMPLICATIONS: COMPLICATIONS None immediate IMPRESSION: Technically successful right IJ power-injectable port catheter placement. Ready for routine use. Electronically Signed   By: Lucrezia Europe M.D.   On: 07/14/2016 12:43    ELIGIBLE FOR AVAILABLE RESEARCH PROTOCOL: no   ASSESSMENT: 46 y.o. [place] woman status post left breast upper outer quadrant and left axillary lymph node biopsy 06/19/2016, both positive for a clinical T2-T3 N1,  stage IIIB-C invasive ductal carcinoma, grade 3, triple negative, with an MIB-1 of 90%  (1) genetics testing 08/04/2016  (2) neoadjuvant chemotherapy to consist of doxorubicin and cyclophosphamide in dose dense fashion 4 followed by paclitaxel weekly 12 given with carboplatin starting 07/20/2016  (3) definitive surgery to follow  (4) adjuvant radiation to follow surgery  PLAN: Locally did much better with cycle 2 and I anticipate she will do equally well or better with the next 2 cycle 4 cyclophosphamide and doxorubicin doses.  The main problem she had was constipation. We are going to have her take docusate 2 tablets twice daily and MiraLAX daily beginning on the day of treatment if she has not had a bowel movement by day 3 she will take milk of magnesia. If that does not work she will call us.  Otherwise she is going on Cipro today to prophylaxis for infection given her severe neutropenia.  She knows to call for any other problems that may develop before her next visit.  Chauncey Cruel, MD   08/11/2016 1:05 PM Medical Oncology and Hematology [facility] [address] [place], [place] [address] Tel. [phone]    Fax. [phone]

## 2016-08-11 NOTE — Telephone Encounter (Signed)
Critical lab values received.  Dr [name] made aware

## 2016-08-14 ENCOUNTER — Ambulatory Visit (HOSPITAL_COMMUNITY): Admission: RE | Admit: 2016-08-14 | Payer: Medicaid Other | Source: Ambulatory Visit

## 2016-08-14 ENCOUNTER — Other Ambulatory Visit: Payer: Self-pay

## 2016-08-14 DIAGNOSIS — Z171 Estrogen receptor negative status [ER-]: Principal | ICD-10-CM

## 2016-08-14 DIAGNOSIS — C50412 Malignant neoplasm of upper-outer quadrant of left female breast: Secondary | ICD-10-CM

## 2016-08-17 ENCOUNTER — Ambulatory Visit: Payer: No Typology Code available for payment source

## 2016-08-17 ENCOUNTER — Other Ambulatory Visit (HOSPITAL_BASED_OUTPATIENT_CLINIC_OR_DEPARTMENT_OTHER): Payer: Medicaid Other

## 2016-08-17 ENCOUNTER — Other Ambulatory Visit: Payer: No Typology Code available for payment source

## 2016-08-17 ENCOUNTER — Ambulatory Visit: Payer: Medicaid Other

## 2016-08-17 ENCOUNTER — Ambulatory Visit (HOSPITAL_BASED_OUTPATIENT_CLINIC_OR_DEPARTMENT_OTHER): Payer: Medicaid Other

## 2016-08-17 DIAGNOSIS — C50412 Malignant neoplasm of upper-outer quadrant of left female breast: Secondary | ICD-10-CM | POA: Diagnosis present

## 2016-08-17 DIAGNOSIS — Z5189 Encounter for other specified aftercare: Secondary | ICD-10-CM

## 2016-08-17 DIAGNOSIS — Z171 Estrogen receptor negative status [ER-]: Principal | ICD-10-CM

## 2016-08-17 DIAGNOSIS — Z95828 Presence of other vascular implants and grafts: Secondary | ICD-10-CM

## 2016-08-17 DIAGNOSIS — Z5111 Encounter for antineoplastic chemotherapy: Secondary | ICD-10-CM | POA: Diagnosis not present

## 2016-08-17 LAB — COMPREHENSIVE METABOLIC PANEL
ALK PHOS: 96 U/L (ref 40–150)
ALT: 14 U/L (ref 0–55)
AST: 13 U/L (ref 5–34)
Albumin: 3.3 g/dL — ABNORMAL LOW (ref 3.5–5.0)
Anion Gap: 9 mEq/L (ref 3–11)
BUN: 7.8 mg/dL (ref 7.0–26.0)
CHLORIDE: 104 meq/L (ref 98–109)
CO2: 27 meq/L (ref 22–29)
Calcium: 9.3 mg/dL (ref 8.4–10.4)
Creatinine: 0.7 mg/dL (ref 0.6–1.1)
GLUCOSE: 111 mg/dL (ref 70–140)
POTASSIUM: 3.6 meq/L (ref 3.5–5.1)
SODIUM: 140 meq/L (ref 136–145)
Total Bilirubin: 0.37 mg/dL (ref 0.20–1.20)
Total Protein: 6.5 g/dL (ref 6.4–8.3)

## 2016-08-17 LAB — CBC WITH DIFFERENTIAL/PLATELET
BASO%: 0.6 % (ref 0.0–2.0)
BASOS ABS: 0 10*3/uL (ref 0.0–0.1)
EOS ABS: 0 10*3/uL (ref 0.0–0.5)
EOS%: 0 % (ref 0.0–7.0)
HCT: 33 % — ABNORMAL LOW (ref 34.8–46.6)
HGB: 11.4 g/dL — ABNORMAL LOW (ref 11.6–15.9)
LYMPH%: 14.3 % (ref 14.0–49.7)
MCH: 28.7 pg (ref 25.1–34.0)
MCHC: 34.6 g/dL (ref 31.5–36.0)
MCV: 82.8 fL (ref 79.5–101.0)
MONO#: 0.5 10*3/uL (ref 0.1–0.9)
MONO%: 6.1 % (ref 0.0–14.0)
NEUT#: 5.9 10*3/uL (ref 1.5–6.5)
NEUT%: 79 % — ABNORMAL HIGH (ref 38.4–76.8)
Platelets: 187 10*3/uL (ref 145–400)
RBC: 3.98 10*6/uL (ref 3.70–5.45)
RDW: 13.8 % (ref 11.2–14.5)
WBC: 7.4 10*3/uL (ref 3.9–10.3)
lymph#: 1.1 10*3/uL (ref 0.9–3.3)

## 2016-08-17 MED ORDER — HEPARIN SOD (PORK) LOCK FLUSH 100 UNIT/ML IV SOLN
500.0000 [IU] | Freq: Once | INTRAVENOUS | Status: AC | PRN
  Administered 2016-08-17: 500 [IU]
  Filled 2016-08-17: qty 5

## 2016-08-17 MED ORDER — SODIUM CHLORIDE 0.9% FLUSH
10.0000 mL | INTRAVENOUS | Status: DC | PRN
  Administered 2016-08-17: 10 mL
  Filled 2016-08-17: qty 10

## 2016-08-17 MED ORDER — SODIUM CHLORIDE 0.9 % IV SOLN
Freq: Once | INTRAVENOUS | Status: AC
  Administered 2016-08-17: 16:00:00 via INTRAVENOUS

## 2016-08-17 MED ORDER — PEGFILGRASTIM 6 MG/0.6ML ~~LOC~~ PSKT
6.0000 mg | PREFILLED_SYRINGE | Freq: Once | SUBCUTANEOUS | Status: AC
  Administered 2016-08-17: 6 mg via SUBCUTANEOUS
  Filled 2016-08-17: qty 0.6

## 2016-08-17 MED ORDER — SODIUM CHLORIDE 0.9 % IV SOLN
Freq: Once | INTRAVENOUS | Status: AC
  Administered 2016-08-17: 16:00:00 via INTRAVENOUS
  Filled 2016-08-17: qty 5

## 2016-08-17 MED ORDER — PALONOSETRON HCL INJECTION 0.25 MG/5ML
INTRAVENOUS | Status: AC
  Filled 2016-08-17: qty 5

## 2016-08-17 MED ORDER — CYCLOPHOSPHAMIDE CHEMO INJECTION 1 GM
600.0000 mg/m2 | Freq: Once | INTRAMUSCULAR | Status: AC
  Administered 2016-08-17: 1420 mg via INTRAVENOUS
  Filled 2016-08-17: qty 71

## 2016-08-17 MED ORDER — DOXORUBICIN HCL CHEMO IV INJECTION 2 MG/ML
60.0000 mg/m2 | Freq: Once | INTRAVENOUS | Status: AC
  Administered 2016-08-17: 142 mg via INTRAVENOUS
  Filled 2016-08-17: qty 71

## 2016-08-17 MED ORDER — SODIUM CHLORIDE 0.9% FLUSH
10.0000 mL | INTRAVENOUS | Status: DC | PRN
  Administered 2016-08-17: 10 mL via INTRAVENOUS
  Filled 2016-08-17: qty 10

## 2016-08-17 MED ORDER — PALONOSETRON HCL INJECTION 0.25 MG/5ML
0.2500 mg | Freq: Once | INTRAVENOUS | Status: AC
  Administered 2016-08-17: 0.25 mg via INTRAVENOUS

## 2016-08-17 NOTE — Patient Instructions (Signed)
[facility] Discharge Instructions for Patients Receiving Chemotherapy  Today you received the following chemotherapy agents Adriamycin /Cytoxan  To help prevent nausea and vomiting after your treatment, we encourage you to take your nausea medication     If you develop nausea and vomiting that is not controlled by your nausea medication, call the clinic.   BELOW ARE SYMPTOMS THAT SHOULD BE REPORTED IMMEDIATELY:  *FEVER GREATER THAN 100.5 F  *CHILLS WITH OR WITHOUT FEVER  NAUSEA AND VOMITING THAT IS NOT CONTROLLED WITH YOUR NAUSEA MEDICATION  *UNUSUAL SHORTNESS OF BREATH  *UNUSUAL BRUISING OR BLEEDING  TENDERNESS IN MOUTH AND THROAT WITH OR WITHOUT PRESENCE OF ULCERS  *URINARY PROBLEMS  *BOWEL PROBLEMS  UNUSUAL RASH Items with * indicate a potential emergency and should be followed up as soon as possible.  Feel free to call the clinic you have any questions or concerns. The clinic phone number is [phone].  Please show the CHEMO ALERT CARD at check-in to the Emergency Department and triage nurse.   

## 2016-08-21 ENCOUNTER — Other Ambulatory Visit: Payer: Self-pay | Admitting: *Deleted

## 2016-08-21 DIAGNOSIS — Z171 Estrogen receptor negative status [ER-]: Principal | ICD-10-CM

## 2016-08-21 DIAGNOSIS — C50412 Malignant neoplasm of upper-outer quadrant of left female breast: Secondary | ICD-10-CM

## 2016-08-24 ENCOUNTER — Other Ambulatory Visit (HOSPITAL_BASED_OUTPATIENT_CLINIC_OR_DEPARTMENT_OTHER): Payer: Medicaid Other

## 2016-08-24 ENCOUNTER — Ambulatory Visit (HOSPITAL_BASED_OUTPATIENT_CLINIC_OR_DEPARTMENT_OTHER): Payer: Medicaid Other | Admitting: Oncology

## 2016-08-24 VITALS — BP 122/68 | HR 108 | Temp 98.3°F | Resp 18 | Ht 67.5 in | Wt 256.7 lb

## 2016-08-24 DIAGNOSIS — Z171 Estrogen receptor negative status [ER-]: Secondary | ICD-10-CM

## 2016-08-24 DIAGNOSIS — C50412 Malignant neoplasm of upper-outer quadrant of left female breast: Secondary | ICD-10-CM

## 2016-08-24 DIAGNOSIS — C773 Secondary and unspecified malignant neoplasm of axilla and upper limb lymph nodes: Secondary | ICD-10-CM

## 2016-08-24 LAB — CBC WITH DIFFERENTIAL/PLATELET
BASO%: 3.2 % — AB (ref 0.0–2.0)
Basophils Absolute: 0 10*3/uL (ref 0.0–0.1)
EOS%: 1.1 % (ref 0.0–7.0)
Eosinophils Absolute: 0 10*3/uL (ref 0.0–0.5)
HEMATOCRIT: 31.6 % — AB (ref 34.8–46.6)
HGB: 10.7 g/dL — ABNORMAL LOW (ref 11.6–15.9)
LYMPH#: 0.3 10*3/uL — AB (ref 0.9–3.3)
LYMPH%: 42.3 % (ref 14.0–49.7)
MCH: 28.2 pg (ref 25.1–34.0)
MCHC: 33.9 g/dL (ref 31.5–36.0)
MCV: 83.1 fL (ref 79.5–101.0)
MONO#: 0 10*3/uL — ABNORMAL LOW (ref 0.1–0.9)
MONO%: 5 % (ref 0.0–14.0)
NEUT%: 48.4 % (ref 38.4–76.8)
NEUTROS ABS: 0.3 10*3/uL — AB (ref 1.5–6.5)
Platelets: 139 10*3/uL — ABNORMAL LOW (ref 145–400)
RBC: 3.8 10*6/uL (ref 3.70–5.45)
RDW: 13.6 % (ref 11.2–14.5)
WBC: 0.6 10*3/uL — AB (ref 3.9–10.3)

## 2016-08-24 LAB — COMPREHENSIVE METABOLIC PANEL
ALBUMIN: 3.3 g/dL — AB (ref 3.5–5.0)
ALK PHOS: 96 U/L (ref 40–150)
ALT: 14 U/L (ref 0–55)
ANION GAP: 7 meq/L (ref 3–11)
AST: 14 U/L (ref 5–34)
BUN: 8 mg/dL (ref 7.0–26.0)
CALCIUM: 9.1 mg/dL (ref 8.4–10.4)
CHLORIDE: 103 meq/L (ref 98–109)
CO2: 29 mEq/L (ref 22–29)
Creatinine: 0.7 mg/dL (ref 0.6–1.1)
Glucose: 163 mg/dl — ABNORMAL HIGH (ref 70–140)
POTASSIUM: 3.4 meq/L — AB (ref 3.5–5.1)
Sodium: 138 mEq/L (ref 136–145)
Total Bilirubin: 0.9 mg/dL (ref 0.20–1.20)
Total Protein: 6.6 g/dL (ref 6.4–8.3)

## 2016-08-24 NOTE — Progress Notes (Signed)
[place]  [phone]) [phone] Fax:([phone]) [phone]     ID: Raneisha Bress DOB: [birth year]  MR#: [id number]  HRC#:[id number]  Patient Care Team: [name], MD as PCP - General (Internal Medicine) [name], MD as Consulting Physician (General Surgery) Bailley Guilford, [name], MD as Consulting Physician (Oncology) [name], MD as Attending Physician (Radiation Oncology) Chauncey Cruel, MD OTHER MD:  CHIEF COMPLAINT: Triple negative breast cancer  CURRENT TREATMENT: Neoadjuvant chemotherapy  INTERVAL HISTORY: Jaloni returns today for follow-up and treatment of her triple negative breast cancer. Today is day 8 cycle 3 of 4 planned cycles of doxorubicin and cyclophosphamide which she receives in dose dense fashion, to be followed by paclitaxel and carboplatin weekly.  She continues to do very well with treatment. She has figured out that if she takes Colace daily she does not need to take MiraLAX and she has a bowel movement most days. It is not perfect she says but it is a lot better and there are no really hard bowel movements.  There have been no intercurrent fevers. Her gums are little sore sometimes but she has never had frank mouth sores.  REVIEW OF SYSTEMS: She was helping her husband on a very busy day in [place] (had to prepare 700 meals) and she was very hot to the point of almost fainting. She noted that she was not sweating. This happened on 2 occasions. It sounds to me like it was close to the heat stroke. Aside from that a detailed review of systems today was stable  BREAST CANCER HISTORY: From the original intake note:  The patient was seen in the emergency room 05/16/2016 with nonspecific complaints but also noting that she had a tender lump in her lateral left breast which she said she had noted the day before. Exam by the emergency room physician confirmed a 3 cm soft left lateral breast mass without overlying erythema or nipple  changes. This was felt to be most consistent with fibrocystic change but the patient was referred back to her primary care physician for further evaluation. She saw the physician assistant in May 18 and then Dr. Wynetta Emery on 06/12/2016 who scheduled a bilateral diagnostic mammography with tomography and left breast ultrasonography at the [facility] 06/18/2016. This found the breast density to be category B. In the left breast upper outer quadrant there was an area of asymmetry measuring up to 10.3 cm. On exam this was firm and palpable and on ultrasound there was an irregular mass measuring 2.5 cm with some subtle changes in the surrounding tissue. In the left lower axilla there were 2 suspicious-looking lymph nodes.  Biopsy of the left breast upper outer quadrant and one of the suspicious lymph nodes 06/19/2016 showed ([id number]) both to be involved by invasive ductal carcinoma, grade 3, estrogen and progesterone receptor negative, HER-2 not amplified with a signals ratio being 1.53-1.74 and the number per cell 2.35-3.05. The MIB-1 was 90%.  The patient's subsequent history is as detailed below.   PAST MEDICAL HISTORY: Past Medical History:  Diagnosis Date  . Malignant neoplasm of upper-outer quadrant of left breast in female, estrogen receptor negative ([place]) 06/23/2016    PAST SURGICAL HISTORY: Past Surgical History:  Procedure Laterality Date  . ANKLE SURGERY    . IR FLUORO GUIDE PORT INSERTION RIGHT  07/14/2016  . IR US GUIDE VASC ACCESS RIGHT  07/14/2016  . TUBAL LIGATION      FAMILY HISTORY Family History  Problem Relation Age of Onset  .  Diabetes Mother   . Hypertension Mother   . Arthritis Mother   . Colon cancer Father 58       d.76 metastatic at time of diagnosis  . Diabetes Father   . Heart disease Father   . Hypertension Father   . Breast cancer Maternal Grandmother 20       d.60s  . Breast cancer Maternal Aunt 52  . Cervical cancer Maternal Aunt 15  . Breast cancer  Maternal Aunt 46       d.50s  . Cancer Maternal Uncle        d.62s unspecified type of cancer  . Breast cancer Cousin 66       paternal first-cousin (daughter of unaffected aunt)  The patient's father died at age 73 from colon cancer which she never had treated. The patient's mother is living, 5 years old as of June 2018. The patient has one brother, one sister. On the mother's side there is a history of breast cancer in the grandmother, age 35, and then 1 aunt with breast cancer age 23, and another with lung cancer age 29. There is no history of ovarian cancer in the family.  GYNECOLOGIC HISTORY:  No LMP recorded. Menarche age 73, first live birth age 2. She is GX P4. Her periods have never been regular. She had a period in June 2018 but not for 4 months before that. She took birth control for some years remotely with no complications. She is status post bilateral tubal ligation.  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 Her husband Delonda Coley (goes by "[name]") is originally from [name]. The patient's children from her first marriage are [name] [name] 47 years old (all ages as of June 2018) recently in the [employer] but now taking courses sent rocking him immunity college, and [name], who is studying psychology at [name]. Together with [name] the patient's children are [name] and hila, aged 11 and 19.     ADVANCED DIRECTIVES: Not in place   HEALTH MAINTENANCE: Social History  Substance Use Topics  . Smoking status: Never Smoker  . Smokeless tobacco: Never Used  . Alcohol use No     Colonoscopy:Never   FAO:ZHYQ 2018  Bone density:Never   No Known Allergies  Current Outpatient Prescriptions  Medication Sig Dispense Refill  . acetaminophen (TYLENOL) 500 MG tablet Take 1,000 mg by  mouth every 6 (six) hours as needed. For fever/pain    . ciprofloxacin (CIPRO) 500 MG tablet Take 1 tablet (500 mg total) by mouth 2 (two) times daily. 40 tablet 1  . dexamethasone (DECADRON) 4 MG tablet Take 1 tablet (4 mg total) by mouth 2 (two) times daily. Take 2 tablets by mouth once a day on the day after chemotherapy and then take 2 tablets two times a day for 2 days. Take with food. 30 tablet 1  . docusate sodium (COLACE) 100 MG capsule Take 1 capsule (100 mg total) by mouth 2 (two) times daily. 60 capsule 2  . hydrochlorothiazide (HYDRODIURIL) 25 MG tablet Take 0.5 tablets (12.5 mg total) by mouth daily. 45 tablet 3  . lidocaine-prilocaine (EMLA) cream Apply to affected area once 30 g 3  . loratadine (CLARITIN) 10 MG tablet Take 1 tablet (10 mg total) by mouth daily. 90 tablet 1  .  LORazepam (ATIVAN) 0.5 MG tablet Take 1 tablet (0.5 mg total) by mouth at bedtime as needed (Nausea or vomiting). 30 tablet 0  . prochlorperazine (COMPAZINE) 10 MG tablet Take 1 tablet (10 mg total) by mouth every 6 (six) hours as needed (Nausea or vomiting). 30 tablet 1  . traZODone (DESYREL) 50 MG tablet Take 0.5-1 tablets (25-50 mg total) by mouth at bedtime as needed for sleep. 30 tablet 3   No current facility-administered medications for this visit.     OBJECTIVE: Middle-aged white woman In no acute distress  Vitals:   08/24/16 1202  BP: 122/68  Pulse: (!) 108  Resp: 18  Temp: 98.3 F (36.8 C)  SpO2: 100%     Body mass index is 39.61 kg/m.   Filed Weights   08/24/16 1202  Weight: 256 lb 11.2 oz (116.4 kg)       ECOG FS:1 - Symptomatic but completely ambulatory  Sclerae unicteric, EOMs intact Oropharynx clear and moist No cervical or supraclavicular adenopathy Lungs no rales or rhonchi Heart regular rate and rhythm Abd soft, nontender, positive bowel sounds MSK no focal spinal tenderness, no upper extremity lymphedema Neuro: nonfocal, well oriented, appropriate affect Breasts: I do  not palpate a mass in either breast. Both axillae are benign.   Left breast on 08/11/2016     LAB RESULTS:  CMP     Component Value Date/Time   NA 140 08/17/2016 1317   K 3.6 08/17/2016 1317   CL 99 (L) 07/25/2016 1830   CO2 27 08/17/2016 1317   GLUCOSE 111 08/17/2016 1317   BUN 7.8 08/17/2016 1317   CREATININE 0.7 08/17/2016 1317   CALCIUM 9.3 08/17/2016 1317   PROT 6.5 08/17/2016 1317   ALBUMIN 3.3 (L) 08/17/2016 1317   AST 13 08/17/2016 1317   ALT 14 08/17/2016 1317   ALKPHOS 96 08/17/2016 1317   BILITOT 0.37 08/17/2016 1317   GFRNONAA >60 07/25/2016 1830   GFRAA >60 07/25/2016 1830    No results found for: TOTALPROTELP, ALBUMINELP, A1GS, A2GS, BETS, BETA2SER, GAMS, MSPIKE, SPEI  No results found for: KPAFRELGTCHN, LAMBDASER, KAPLAMBRATIO  Lab Results  Component Value Date   WBC 0.6 (LL) 08/24/2016   NEUTROABS 0.3 (LL) 08/24/2016   HGB 10.7 (L) 08/24/2016   HCT 31.6 (L) 08/24/2016   MCV 83.1 08/24/2016   PLT 139 (L) 08/24/2016      Chemistry      Component Value Date/Time   NA 140 08/17/2016 1317   K 3.6 08/17/2016 1317   CL 99 (L) 07/25/2016 1830   CO2 27 08/17/2016 1317   BUN 7.8 08/17/2016 1317   CREATININE 0.7 08/17/2016 1317      Component Value Date/Time   CALCIUM 9.3 08/17/2016 1317   ALKPHOS 96 08/17/2016 1317   AST 13 08/17/2016 1317   ALT 14 08/17/2016 1317   BILITOT 0.37 08/17/2016 1317       No results found for: LABCA2  No components found for: [id number]  No results for input(s): INR in the last 168 hours.  Urinalysis    Component Value Date/Time   COLORURINE AMBER (A) 05/16/2016 1709   APPEARANCEUR CLOUDY (A) 05/16/2016 1709   LABSPEC 1.023 05/16/2016 1709   PHURINE 5.5 05/16/2016 1709   GLUCOSEU NEGATIVE 05/16/2016 1709   HGBUR NEGATIVE 05/16/2016 1709   BILIRUBINUR NEGATIVE 05/16/2016 1709   KETONESUR NEGATIVE 05/16/2016 1709   PROTEINUR NEGATIVE 05/16/2016 1709   UROBILINOGEN 0.2 01/04/2010 1911   NITRITE NEGATIVE  05/16/2016 1709  LEUKOCYTESUR NEGATIVE 05/16/2016 1709     STUDIES: Ct Head Wo Contrast  Result Date: 08/05/2016 CLINICAL DATA:  Headache for 2 weeks. Currently receiving chemotherapy for breast carcinoma EXAM: CT HEAD WITHOUT CONTRAST TECHNIQUE: Contiguous axial images were obtained from the base of the skull through the vertex without intravenous contrast. COMPARISON:  None. FINDINGS: Brain: Ventricles are normal in size and configuration. Mild prominence of the cisterna magna is an anatomic variant. There is no intracranial mass, hemorrhage, midline shift, or extra-axial fluid collection apparent. Decreased attenuation is seen throughout mid pons region. Elsewhere gray-white compartments appear normal. Vascular: No hyperdense vessel. There is modest calcification in the cavernous carotid artery regions bilaterally. Skull: The bony calvarium appears intact. There is symmetric somewhat ground-glass type appearance along each inferior orbital floor. Sinuses/Orbits: There is mild mucosal thickening in several ethmoid air cells bilaterally. Other visualized paranasal sinuses are clear. Orbits appear symmetric bilaterally. Other: Mastoid air cells are clear. IMPRESSION: 1. Decreased attenuation is seen in the mid pons region. Elsewhere gray-white compartments appear normal. 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 This entity is usually seen following rapid correction of hyponatremia. It has been seen associated with chemotherapy on occasion, however. Given this appearance, correlation with brain MRI pre and post-contrast is advised. This area conceivably could represent edema from a focal metastasis in this area. 2. Elsewhere gray-white compartments appear normal. No mass is seen on noncontrast enhanced study. No hemorrhage. No extra-axial fluid. 3.  Slight calcification in each cavernous carotid artery. 4.  Mild mucosal thickening in each  ethmoid air cell. 5. Essentially symmetric ground-glass type opacity in each orbital floor region. The appearance of the bone in this area is atypical for metastatic disease. 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 Particular attention to these areas on MR advised. These results will be called to the ordering clinician or representative by the Radiologist Assistant, and communication documented in the PACS or zVision Dashboard. Electronically Signed   By: Lowella Grip III M.D.   On: 08/05/2016 16:05   Ct Chest W Contrast  Result Date: 07/25/2016 CLINICAL DATA:  Known left breast cancer with increased swelling and heaviness. [place]. EXAM: CT CHEST WITH CONTRAST TECHNIQUE: Multidetector CT imaging of the chest was performed during intravenous contrast administration. CONTRAST:  156m ISOVUE-300 IOPAMIDOL (ISOVUE-300) INJECTION 61% COMPARISON:  07/06/2016 FINDINGS: Cardiovascular: Normal heart size. No pericardial effusion. No acute vascular finding. Mediastinum/Nodes: Negative for thoracic adenopathy. 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 The superficial left axillary node is essentially size stable at 17 mm short axis. There is greater coverage of the left breast compared to prior. A mass with calcification is seen in the outer left breast. This skin is diffusely thicker over the left breast when compared to prior. This may reflect inflammatory cancer, but did develop fairly rapidly and infection is not excluded. Per EMR, no radiation has been performed. Reportedly there is concern for abscess. No abscess is present. Lungs/Pleura: There is no edema, consolidation, effusion, or pneumothorax. Negative for nodule Upper Abdomen: No acute or aggressive finding. Cholelithiasis, known from prior. Musculoskeletal: No acute or aggressive finding. IMPRESSION: Skin thickening over the left breast which is new/ progressed since 07/06/2016. In the  setting of ipsilateral breast cancer with axillary adenopathy, this is primarily concerning for inflammatory cancer, but did develop rapidly and needs correlation for infectious symptoms. There was clinical concern for abscess, which is absent. Electronically Signed  By: Monte Fantasia M.D.   On: 07/25/2016 20:32    ELIGIBLE FOR AVAILABLE RESEARCH PROTOCOL: no   ASSESSMENT: 46 y.o. [place] woman status post left breast upper outer quadrant and left axillary lymph node biopsy 06/19/2016, both positive for a clinical T2-T3 N1, stage IIIB-C invasive ductal carcinoma, grade 3, triple negative, with an MIB-1 of 90%  (1) genetics testing 08/04/2016  (2) neoadjuvant chemotherapy to consist of doxorubicin and cyclophosphamide in dose dense fashion 4 followed by paclitaxel weekly 12 given with carboplatin starting 07/20/2016  (3) definitive surgery to follow  (4) adjuvant radiation to follow surgery  PLAN: Missy has pretty much figured out how to survive these treatments and is doing quite well with that. The good news of course is that she has had no major problems and has not needed dose reductions or delays.  We discussed management of the constipation and she is now taking Colace daily. That is working well and she has not needed MiraLAX.  With her Patterson below 500 I am putting her on Cipro for 5 days. She has tolerated that well in the earlier 2 cycles  I don't have a simple explanation for her near heat stroke. It is interesting that she was not sweating despite being very hot. We talked about what she can do that happens but the main thing for her to do is to avoid significant heat exposure in the next few weeks.  She knows to call for any other issues that may develop before the next visit here. Chauncey Cruel, MD   08/24/2016 12:19 PM Medical Oncology and Hematology [facility] [address] [place],  [address] Tel. [phone]    Fax. [phone]

## 2016-08-25 ENCOUNTER — Ambulatory Visit (HOSPITAL_COMMUNITY)
Admission: RE | Admit: 2016-08-25 | Discharge: 2016-08-25 | Disposition: A | Discharge status: Home / Self Care | Payer: Medicaid Other | Source: Ambulatory Visit | Attending: Internal Medicine | Admitting: Internal Medicine

## 2016-08-25 DIAGNOSIS — R9389 Abnormal findings on diagnostic imaging of other specified body structures: Secondary | ICD-10-CM

## 2016-08-25 DIAGNOSIS — R938 Abnormal findings on diagnostic imaging of other specified body structures: Secondary | ICD-10-CM | POA: Insufficient documentation

## 2016-08-25 MED ORDER — GADOBENATE DIMEGLUMINE 529 MG/ML IV SOLN
20.0000 mL | Freq: Once | INTRAVENOUS | Status: AC | PRN
  Administered 2016-08-25: 20 mL via INTRAVENOUS

## 2016-08-26 ENCOUNTER — Emergency Department (HOSPITAL_BASED_OUTPATIENT_CLINIC_OR_DEPARTMENT_OTHER): Payer: Medicaid Other

## 2016-08-26 ENCOUNTER — Inpatient Hospital Stay (HOSPITAL_BASED_OUTPATIENT_CLINIC_OR_DEPARTMENT_OTHER)
Admission: EM | Admit: 2016-08-26 | Discharge: 2016-08-29 | DRG: 809 | Disposition: A | Discharge status: Home / Self Care | Payer: Medicaid Other | Attending: Family Medicine | Admitting: Family Medicine

## 2016-08-26 ENCOUNTER — Encounter (HOSPITAL_BASED_OUTPATIENT_CLINIC_OR_DEPARTMENT_OTHER): Payer: Self-pay

## 2016-08-26 DIAGNOSIS — Z6841 Body Mass Index (BMI) 40.0 and over, adult: Secondary | ICD-10-CM | POA: Diagnosis not present

## 2016-08-26 DIAGNOSIS — A419 Sepsis, unspecified organism: Secondary | ICD-10-CM | POA: Diagnosis not present

## 2016-08-26 DIAGNOSIS — R5081 Fever presenting with conditions classified elsewhere: Secondary | ICD-10-CM | POA: Diagnosis present

## 2016-08-26 DIAGNOSIS — L568 Other specified acute skin changes due to ultraviolet radiation: Secondary | ICD-10-CM | POA: Diagnosis present

## 2016-08-26 DIAGNOSIS — E669 Obesity, unspecified: Secondary | ICD-10-CM | POA: Diagnosis present

## 2016-08-26 DIAGNOSIS — C773 Secondary and unspecified malignant neoplasm of axilla and upper limb lymph nodes: Secondary | ICD-10-CM | POA: Diagnosis not present

## 2016-08-26 DIAGNOSIS — I10 Essential (primary) hypertension: Secondary | ICD-10-CM | POA: Diagnosis present

## 2016-08-26 DIAGNOSIS — Z171 Estrogen receptor negative status [ER-]: Secondary | ICD-10-CM | POA: Diagnosis not present

## 2016-08-26 DIAGNOSIS — E876 Hypokalemia: Secondary | ICD-10-CM | POA: Diagnosis present

## 2016-08-26 DIAGNOSIS — Z833 Family history of diabetes mellitus: Secondary | ICD-10-CM

## 2016-08-26 DIAGNOSIS — B349 Viral infection, unspecified: Secondary | ICD-10-CM | POA: Diagnosis present

## 2016-08-26 DIAGNOSIS — R21 Rash and other nonspecific skin eruption: Secondary | ICD-10-CM | POA: Diagnosis not present

## 2016-08-26 DIAGNOSIS — Z8249 Family history of ischemic heart disease and other diseases of the circulatory system: Secondary | ICD-10-CM

## 2016-08-26 DIAGNOSIS — C50412 Malignant neoplasm of upper-outer quadrant of left female breast: Secondary | ICD-10-CM

## 2016-08-26 DIAGNOSIS — R509 Fever, unspecified: Secondary | ICD-10-CM | POA: Diagnosis not present

## 2016-08-26 DIAGNOSIS — Z9221 Personal history of antineoplastic chemotherapy: Secondary | ICD-10-CM

## 2016-08-26 DIAGNOSIS — D696 Thrombocytopenia, unspecified: Secondary | ICD-10-CM | POA: Diagnosis present

## 2016-08-26 DIAGNOSIS — D709 Neutropenia, unspecified: Secondary | ICD-10-CM | POA: Diagnosis present

## 2016-08-26 DIAGNOSIS — D703 Neutropenia due to infection: Principal | ICD-10-CM | POA: Diagnosis present

## 2016-08-26 DIAGNOSIS — Z803 Family history of malignant neoplasm of breast: Secondary | ICD-10-CM | POA: Diagnosis not present

## 2016-08-26 DIAGNOSIS — R197 Diarrhea, unspecified: Secondary | ICD-10-CM | POA: Diagnosis not present

## 2016-08-26 DIAGNOSIS — Z8049 Family history of malignant neoplasm of other genital organs: Secondary | ICD-10-CM | POA: Diagnosis not present

## 2016-08-26 LAB — URINALYSIS, ROUTINE W REFLEX MICROSCOPIC
BILIRUBIN URINE: NEGATIVE
Glucose, UA: NEGATIVE mg/dL
HGB URINE DIPSTICK: NEGATIVE
KETONES UR: NEGATIVE mg/dL
Leukocytes, UA: NEGATIVE
NITRITE: NEGATIVE
PH: 6 (ref 5.0–8.0)
Protein, ur: NEGATIVE mg/dL
Specific Gravity, Urine: 1.006 (ref 1.005–1.030)

## 2016-08-26 LAB — COMPREHENSIVE METABOLIC PANEL
ALT: 14 U/L (ref 14–54)
ANION GAP: 9 (ref 5–15)
AST: 24 U/L (ref 15–41)
Albumin: 3.4 g/dL — ABNORMAL LOW (ref 3.5–5.0)
Alkaline Phosphatase: 89 U/L (ref 38–126)
BILIRUBIN TOTAL: 0.4 mg/dL (ref 0.3–1.2)
BUN: 5 mg/dL — ABNORMAL LOW (ref 6–20)
CALCIUM: 8.5 mg/dL — AB (ref 8.9–10.3)
CO2: 25 mmol/L (ref 22–32)
Chloride: 102 mmol/L (ref 101–111)
Creatinine, Ser: 0.64 mg/dL (ref 0.44–1.00)
GFR calc non Af Amer: >60 mL/min (ref >60)
Glucose, Bld: 144 mg/dL — ABNORMAL HIGH (ref 65–99)
Potassium: 3.5 mmol/L (ref 3.5–5.1)
Sodium: 136 mmol/L (ref 135–145)
TOTAL PROTEIN: 6.7 g/dL (ref 6.5–8.1)

## 2016-08-26 LAB — CBC WITH DIFFERENTIAL/PLATELET
Basophils Absolute: 0 10*3/uL (ref 0.0–0.1)
Basophils Relative: 0 %
EOS ABS: 0 10*3/uL (ref 0.0–0.7)
Eosinophils Relative: 1 %
HCT: 24.3 % — ABNORMAL LOW (ref 36.0–46.0)
Hemoglobin: 8.1 g/dL — ABNORMAL LOW (ref 12.0–15.0)
LYMPHS PCT: 59 %
Lymphs Abs: 1 10*3/uL (ref 0.7–4.0)
MCH: 28.4 pg (ref 26.0–34.0)
MCHC: 33.3 g/dL (ref 30.0–36.0)
MCV: 85.3 fL (ref 78.0–100.0)
MONO ABS: 0.5 10*3/uL (ref 0.1–1.0)
Monocytes Relative: 28 %
NEUTROS PCT: 12 %
Neutro Abs: 0.2 10*3/uL — ABNORMAL LOW (ref 1.7–7.7)
PLATELETS: 179 10*3/uL (ref 150–400)
RBC: 2.85 MIL/uL — ABNORMAL LOW (ref 3.87–5.11)
RDW: 13.6 % (ref 11.5–15.5)
WBC: 1.7 10*3/uL — ABNORMAL LOW (ref 4.0–10.5)
nRBC: 3 /100 WBC — ABNORMAL HIGH

## 2016-08-26 LAB — PROTIME-INR
INR: 0.9
PROTHROMBIN TIME: 12.1 s (ref 11.4–15.2)

## 2016-08-26 LAB — I-STAT CG4 LACTIC ACID, ED
Lactic Acid, Venous: 2.48 mmol/L (ref 0.5–1.9)
Lactic Acid, Venous: 3.24 mmol/L (ref 0.5–1.9)

## 2016-08-26 MED ORDER — VANCOMYCIN HCL IN DEXTROSE 1-5 GM/200ML-% IV SOLN
1000.0000 mg | Freq: Once | INTRAVENOUS | Status: AC
  Administered 2016-08-26: 1000 mg via INTRAVENOUS
  Filled 2016-08-26: qty 200

## 2016-08-26 MED ORDER — ACETAMINOPHEN 500 MG PO TABS
ORAL_TABLET | ORAL | Status: AC
  Filled 2016-08-26: qty 1

## 2016-08-26 MED ORDER — CEFEPIME HCL 2 G IJ SOLR
INTRAMUSCULAR | Status: AC
  Filled 2016-08-26: qty 2

## 2016-08-26 MED ORDER — DEXTROSE 5 % IV SOLN
2.0000 g | Freq: Once | INTRAVENOUS | Status: AC
  Administered 2016-08-26: 2 g via INTRAVENOUS

## 2016-08-26 MED ORDER — ACETAMINOPHEN 500 MG PO TABS
500.0000 mg | ORAL_TABLET | Freq: Once | ORAL | Status: AC
  Administered 2016-08-26: 500 mg via ORAL

## 2016-08-26 MED ORDER — SODIUM CHLORIDE 0.9 % IV BOLUS (SEPSIS)
2000.0000 mL | Freq: Once | INTRAVENOUS | Status: AC
  Administered 2016-08-26: 2000 mL via INTRAVENOUS

## 2016-08-26 MED ORDER — SODIUM CHLORIDE 0.9 % IV BOLUS (SEPSIS)
1500.0000 mL | Freq: Once | INTRAVENOUS | Status: AC
  Administered 2016-08-26: 1500 mL via INTRAVENOUS

## 2016-08-26 NOTE — ED Notes (Signed)
ED Provider at bedside. 

## 2016-08-26 NOTE — ED Notes (Signed)
amb to BR w/o difficulty 

## 2016-08-26 NOTE — ED Notes (Signed)
Patient transported to X-ray 

## 2016-08-26 NOTE — ED Triage Notes (Signed)
C/o fever started last night-pt is chemo pt-last chemo last Monday-fever at home 100.5-tylenol 635pm-was advised by oncology to come to ED-NAD-steady gait

## 2016-08-26 NOTE — ED Provider Notes (Signed)
[name] Note   CSN: [id number] Arrival date & time: 08/26/16  2011     History   Chief Complaint Chief Complaint  Patient presents with  . Fever    HPI [name] is a 46 y.o. female.  HPI Patient woke around 3 AM with diaphoresis and fever to 100.5. Complaint of generalized myalgias and fatigue. She's had a mild "scratchy" cough. No nausea, vomiting or diarrhea. Denies chest pain or abdominal pain. No urinary symptoms. Has developed bilateral upper extremity rash over the last 2 days. Describes the rash as burning. Patient is being treated for breast cancer. Last treatment was last Monday. She took a course of Decadron and was started on prophylactic Cipro on Monday. Past Medical History:  Diagnosis Date  . Malignant neoplasm of upper-outer quadrant of left breast in female, estrogen receptor negative ([name]) 06/23/2016    Patient Active Problem List   Diagnosis Date Noted  . Neutropenic fever ([name]) 08/26/2016  . Constipation 07/27/2016  . Malignant neoplasm of upper-outer quadrant of left breast in female, estrogen receptor negative ([name]) 06/23/2016  . Left breast mass 06/12/2016  . Essential hypertension 06/12/2016  . Class 3 severe obesity due to excess calories without serious comorbidity with body mass index (BMI) of 40.0 to 44.9 in adult ([name]) 06/12/2016  . Low vitamin D level 06/12/2016  . Irregular menses 06/12/2016    Past Surgical History:  Procedure Laterality Date  . ANKLE SURGERY    . IR FLUORO GUIDE PORT INSERTION RIGHT  07/14/2016  . IR US GUIDE VASC ACCESS RIGHT  07/14/2016  . TUBAL LIGATION      OB History    Gravida Para Term Preterm AB Living   6       2 4    SAB TAB Ectopic Multiple Live Births   2       4       Home Medications    Prior to Admission medications   Medication Sig Start Date End Date Taking? Authorizing Provider  acetaminophen (TYLENOL) 500 MG tablet Take 1,000 mg by mouth every 6 (six) hours as  needed. For fever/pain    [provider]  ciprofloxacin (CIPRO) 500 MG tablet Take 1 tablet (500 mg total) by mouth 2 (two) times daily. 07/28/16   Magrinat, [name], MD  dexamethasone (DECADRON) 4 MG tablet Take 1 tablet (4 mg total) by mouth 2 (two) times daily. Take 2 tablets by mouth once a day on the day after chemotherapy and then take 2 tablets two times a day for 2 days. Take with food. 08/11/16   Magrinat, [name], MD  docusate sodium (COLACE) 100 MG capsule Take 1 capsule (100 mg total) by mouth 2 (two) times daily. 07/27/16   [name], MD  hydrochlorothiazide (HYDRODIURIL) 25 MG tablet Take 0.5 tablets (12.5 mg total) by mouth daily. 06/12/16   [name], MD  lidocaine-prilocaine (EMLA) cream Apply to affected area once 07/17/16   Magrinat, [name], MD  loratadine (CLARITIN) 10 MG tablet Take 1 tablet (10 mg total) by mouth daily. 07/17/16   Magrinat, [name], MD  LORazepam (ATIVAN) 0.5 MG tablet Take 1 tablet (0.5 mg total) by mouth at bedtime as needed (Nausea or vomiting). 07/17/16   Magrinat, [name], MD  prochlorperazine (COMPAZINE) 10 MG tablet Take 1 tablet (10 mg total) by mouth every 6 (six) hours as needed (Nausea or vomiting). 07/17/16   Magrinat, [name], MD  traZODone (DESYREL) 50  MG tablet Take 0.5-1 tablets (25-50 mg total) by mouth at bedtime as needed for sleep. 06/23/16   [name], MD    Family History Family History  Problem Relation Age of Onset  . Diabetes Mother   . Hypertension Mother   . Arthritis Mother   . Colon cancer Father 68       d.76 metastatic at time of diagnosis  . Diabetes Father   . Heart disease Father   . Hypertension Father   . Breast cancer Maternal Grandmother 25       d.60s  . Breast cancer Maternal Aunt 52  . Cervical cancer Maternal Aunt 96  . Breast cancer Maternal Aunt 46       d.50s  . Cancer Maternal Uncle        d.62s unspecified type of cancer  . Breast cancer Cousin 43       paternal  first-cousin (daughter of unaffected aunt)    Social History Social History  Substance Use Topics  . Smoking status: Never Smoker  . Smokeless tobacco: Never Used  . Alcohol use No     Allergies   Patient has no known allergies.   Review of Systems Review of Systems  Constitutional: Positive for chills, diaphoresis, fatigue and fever.  HENT: Negative for sinus pain, sinus pressure, sore throat and trouble swallowing.   Eyes: Negative for photophobia and visual disturbance.  Respiratory: Positive for cough. Negative for shortness of breath.   Cardiovascular: Negative for chest pain, palpitations and leg swelling.  Gastrointestinal: Negative for abdominal pain, diarrhea, nausea and vomiting.  Genitourinary: Negative for difficulty urinating, dysuria, flank pain and frequency.  Musculoskeletal: Positive for back pain and myalgias. Negative for neck pain and neck stiffness.  Skin: Negative for rash and wound.  Neurological: Positive for weakness (generalized). Negative for dizziness, syncope, light-headedness, numbness and headaches.  All other systems reviewed and are negative.    Physical Exam Updated Vital Signs BP (!) 149/90   Pulse 91   Temp 98.5 F (36.9 C)   Resp 20   Ht 5' 7.5" (1.715 m)   Wt 114.8 kg (253 lb)   LMP  (LMP Unknown)   SpO2 100%   BMI 39.04 kg/m   Physical Exam  Constitutional: She is oriented to person, place, and time. She appears well-developed and well-nourished. No distress.  HENT:  Head: Normocephalic and atraumatic.  Mouth/Throat: Oropharynx is clear and moist. No oropharyngeal exudate.  Eyes: Pupils are equal, round, and reactive to light. EOM are normal.  Neck: Normal range of motion. Neck supple.  No meningismus  Cardiovascular: Regular rhythm.  Exam reveals no gallop and no friction rub.   No murmur heard. Tachycardia  Pulmonary/Chest: Effort normal and breath sounds normal. No respiratory distress. She has no wheezes. She has no  rales. She exhibits no tenderness.  Abdominal: Soft. Bowel sounds are normal. She exhibits no distension and no mass. There is no tenderness. There is no rebound and no guarding.  Musculoskeletal: Normal range of motion. She exhibits no edema or tenderness.  No midline thoracic or lumbar tenderness. No CVA tenderness bilaterally.  Lymphadenopathy:    She has no cervical adenopathy.  Neurological: She is alert and oriented to person, place, and time.  Moving all extremities without focal deficit. Sensation fully intact.  Skin: Skin is warm and dry. Capillary refill takes less than 2 seconds. Rash noted. No erythema.  Patient with clusters of maculopapular rash to the dorsum of the bilateral forearms.  Mildly erythematous.  Psychiatric: She has a normal mood and affect. Her behavior is normal.  Nursing note and vitals reviewed.    ED Treatments / Results  Labs (all labs ordered are listed, but only abnormal results are displayed) Labs Reviewed  COMPREHENSIVE METABOLIC PANEL - Abnormal; Notable for the following:       Result Value   Glucose, Bld 144 (*)    BUN 5 (*)    Calcium 8.5 (*)    Albumin 3.4 (*)    All other components within normal limits  CBC WITH DIFFERENTIAL/PLATELET - Abnormal; Notable for the following:    WBC 1.7 (*)    RBC 2.85 (*)    Hemoglobin 8.1 (*)    HCT 24.3 (*)    nRBC 3 (*)    Neutro Abs 0.2 (*)    All other components within normal limits  I-STAT CG4 LACTIC ACID, ED - Abnormal; Notable for the following:    Lactic Acid, Venous 3.24 (*)    All other components within normal limits  CULTURE, BLOOD (ROUTINE X 2)  CULTURE, BLOOD (ROUTINE X 2)  PROTIME-INR  URINALYSIS, ROUTINE W REFLEX MICROSCOPIC  I-STAT CG4 LACTIC ACID, ED    EKG  EKG Interpretation None       Radiology Dg Chest 2 View  Result Date: 08/26/2016 CLINICAL DATA:  Fever, active chemotherapy for breast cancer. EXAM: CHEST  2 VIEW COMPARISON:  Chest CT 07/25/2016 FINDINGS: Right  chest port with tip in the mid SVC. The cardiomediastinal contours are normal. The lungs are clear. Pulmonary vasculature is normal. No consolidation, pleural effusion, or pneumothorax. No acute osseous abnormalities are seen. IMPRESSION: No acute abnormality.  Right chest port in place. Electronically Signed   By: Jeb Levering M.D.   On: 08/26/2016 21:43   Mr Jeri Cos KW Contrast  Result Date: 08/26/2016 CLINICAL DATA:  Breast cancer with headache. Abnormal recent CT scan. EXAM: MRI HEAD WITHOUT AND WITH CONTRAST TECHNIQUE: Multiplanar, multiecho pulse sequences of the brain and surrounding structures were obtained without and with intravenous contrast. CONTRAST:  57mL MULTIHANCE GADOBENATE DIMEGLUMINE 529 MG/ML IV SOLN COMPARISON:  None. FINDINGS: Brain: The midline structures are normal. There is no focal diffusion restriction to indicate acute infarct. There are 1-2 scattered foci of hyperintense T2 weighted signal in the frontal white matter, which may be seen in the setting of migraine headaches or early chronic microvascular disease; however, it is also seen in normal patients of this age. Parenchymal signal is otherwise normal. Specifically, there is no abnormality of the pons. No intraparenchymal hematoma or chronic microhemorrhage. Brain volume is normal for age without lobar predominant atrophy. The dura is normal and there is no extra-axial collection. Vascular: Major intracranial arterial and venous sinus flow voids are preserved. Skull and upper cervical spine: The visualized skull base, calvarium, upper cervical spine and extracranial soft tissues are normal. Sinuses/Orbits: No fluid levels or advanced mucosal thickening. No mastoid or middle ear effusion. Normal orbits. Specifically, no abnormality of the orbital floor. IMPRESSION: Normal MRI of the brain. Electronically Signed   By: Ulyses Jarred M.D.   On: 08/26/2016 01:04    Procedures Procedures (including critical care  time)  Medications Ordered in ED Medications  vancomycin (VANCOCIN) IVPB 1000 mg/200 mL premix (1,000 mg Intravenous New Bag/Given 08/26/16 2221)  sodium chloride 0.9 % bolus 1,500 mL (1,500 mLs Intravenous New Bag/Given 08/26/16 2200)  ceFEPIme (MAXIPIME) 2 g injection (not administered)  sodium chloride 0.9 % bolus 2,000 mL (0  mLs Intravenous Stopped 08/26/16 2200)  ceFEPIme (MAXIPIME) 2 g in dextrose 5 % 50 mL IVPB (0 g Intravenous Stopped 08/26/16 2222)     Initial Impression / Assessment and Plan / ED Course  I have reviewed the triage vital signs and the nursing notes.  Pertinent labs & imaging results that were available during my care of the patient were reviewed by me and considered in my medical decision making (see chart for details).     Meet sepsis criteria. Given broad-spectrum antibiotics and 30 mL per KG. The source of infection is not identified. Discussed with oncologist and anticipate transfer to [facility] long Tachycardia has resolved. Patient remains hemodynamically stable. Discussed with oncologist on call who will see patient tomorrow morning. Agrees with plan. Hospitalist, Dr. Dreama Saa will accept in transfer.  Final Clinical Impressions(s) / ED Diagnoses   Final diagnoses:  Neutropenic fever ([place])    New Prescriptions New Prescriptions   No medications on file     Julianne Rice, MD 08/26/16 2254

## 2016-08-26 NOTE — Progress Notes (Signed)
I was called regarding transfer to [facility]. Pt has breast ca s/p chemotherapy , last one on Monday, comes today with cc of fever/chills and found to be tachycardic. UA/CXR and PE was unremarkable in identifying source of infection. ED physician started vanc/zosyn. Oncologist was called and he advised transfer to [facility] and will see pt in am. I accepted the trasnfer to a tele bed. Pt is not hypotensive. Tachycardia improved with IVF.

## 2016-08-27 ENCOUNTER — Other Ambulatory Visit: Payer: Self-pay | Admitting: Oncology

## 2016-08-27 DIAGNOSIS — R21 Rash and other nonspecific skin eruption: Secondary | ICD-10-CM | POA: Diagnosis present

## 2016-08-27 DIAGNOSIS — C773 Secondary and unspecified malignant neoplasm of axilla and upper limb lymph nodes: Secondary | ICD-10-CM

## 2016-08-27 DIAGNOSIS — R5081 Fever presenting with conditions classified elsewhere: Secondary | ICD-10-CM

## 2016-08-27 DIAGNOSIS — A419 Sepsis, unspecified organism: Secondary | ICD-10-CM | POA: Diagnosis present

## 2016-08-27 DIAGNOSIS — I10 Essential (primary) hypertension: Secondary | ICD-10-CM

## 2016-08-27 DIAGNOSIS — D709 Neutropenia, unspecified: Secondary | ICD-10-CM

## 2016-08-27 DIAGNOSIS — Z171 Estrogen receptor negative status [ER-]: Secondary | ICD-10-CM

## 2016-08-27 DIAGNOSIS — R197 Diarrhea, unspecified: Secondary | ICD-10-CM

## 2016-08-27 DIAGNOSIS — C50412 Malignant neoplasm of upper-outer quadrant of left female breast: Secondary | ICD-10-CM

## 2016-08-27 DIAGNOSIS — E876 Hypokalemia: Secondary | ICD-10-CM

## 2016-08-27 LAB — CBC
HEMATOCRIT: 24.7 % — AB (ref 36.0–46.0)
Hemoglobin: 8.4 g/dL — ABNORMAL LOW (ref 12.0–15.0)
MCH: 28.5 pg (ref 26.0–34.0)
MCHC: 34 g/dL (ref 30.0–36.0)
MCV: 83.7 fL (ref 78.0–100.0)
Platelets: 110 10*3/uL — ABNORMAL LOW (ref 150–400)
RBC: 2.95 MIL/uL — ABNORMAL LOW (ref 3.87–5.11)
RDW: 14.2 % (ref 11.5–15.5)
WBC: 1.6 10*3/uL — AB (ref 4.0–10.5)

## 2016-08-27 LAB — BRAIN NATRIURETIC PEPTIDE: B NATRIURETIC PEPTIDE 5: 23.8 pg/mL (ref 0.0–100.0)

## 2016-08-27 LAB — BASIC METABOLIC PANEL
Anion gap: 4 — ABNORMAL LOW (ref 5–15)
CHLORIDE: 109 mmol/L (ref 101–111)
CO2: 27 mmol/L (ref 22–32)
CREATININE: 0.51 mg/dL (ref 0.44–1.00)
Calcium: 7.8 mg/dL — ABNORMAL LOW (ref 8.9–10.3)
GFR calc Af Amer: >60 mL/min (ref >60)
GFR calc non Af Amer: >60 mL/min (ref >60)
GLUCOSE: 113 mg/dL — AB (ref 65–99)
POTASSIUM: 3.4 mmol/L — AB (ref 3.5–5.1)
SODIUM: 140 mmol/L (ref 135–145)

## 2016-08-27 LAB — RESPIRATORY PANEL BY PCR
ADENOVIRUS-RVPPCR: NOT DETECTED
Bordetella pertussis: NOT DETECTED
CORONAVIRUS 229E-RVPPCR: NOT DETECTED
CORONAVIRUS HKU1-RVPPCR: NOT DETECTED
CORONAVIRUS NL63-RVPPCR: NOT DETECTED
Chlamydophila pneumoniae: NOT DETECTED
Coronavirus OC43: NOT DETECTED
INFLUENZA A-RVPPCR: NOT DETECTED
INFLUENZA B-RVPPCR: NOT DETECTED
MYCOPLASMA PNEUMONIAE-RVPPCR: NOT DETECTED
Metapneumovirus: NOT DETECTED
PARAINFLUENZA VIRUS 1-RVPPCR: NOT DETECTED
PARAINFLUENZA VIRUS 4-RVPPCR: NOT DETECTED
Parainfluenza Virus 2: NOT DETECTED
Parainfluenza Virus 3: NOT DETECTED
RHINOVIRUS / ENTEROVIRUS - RVPPCR: NOT DETECTED
Respiratory Syncytial Virus: NOT DETECTED

## 2016-08-27 LAB — HIV ANTIBODY (ROUTINE TESTING W REFLEX): HIV Screen 4th Generation wRfx: NONREACTIVE

## 2016-08-27 LAB — LACTIC ACID, PLASMA
LACTIC ACID, VENOUS: 1.1 mmol/L (ref 0.5–1.9)
LACTIC ACID, VENOUS: 1.7 mmol/L (ref 0.5–1.9)
Lactic Acid, Venous: 2.2 mmol/L (ref 0.5–1.9)

## 2016-08-27 LAB — PROCALCITONIN: Procalcitonin: <0.1 ng/mL

## 2016-08-27 LAB — HCG, QUANTITATIVE, PREGNANCY: HCG, BETA CHAIN, QUANT, S: 1 m[IU]/mL (ref <5)

## 2016-08-27 MED ORDER — DOCUSATE SODIUM 100 MG PO CAPS: 100.0000 mg | ORAL_CAPSULE | Freq: Two times a day (BID) | ORAL | Status: DC | PRN

## 2016-08-27 MED ORDER — VANCOMYCIN HCL IN DEXTROSE 1-5 GM/200ML-% IV SOLN
1000.0000 mg | Freq: Three times a day (TID) | INTRAVENOUS | Status: DC
  Administered 2016-08-27 – 2016-08-29 (×7): 1000 mg via INTRAVENOUS
  Filled 2016-08-27 (×6): qty 200

## 2016-08-27 MED ORDER — VALACYCLOVIR HCL 500 MG PO TABS
1000.0000 mg | ORAL_TABLET | Freq: Three times a day (TID) | ORAL | Status: DC
  Administered 2016-08-27 – 2016-08-28 (×4): 1000 mg via ORAL
  Filled 2016-08-27 (×4): qty 2

## 2016-08-27 MED ORDER — ENOXAPARIN SODIUM 40 MG/0.4ML ~~LOC~~ SOLN: 40.0000 mg | SUBCUTANEOUS | Status: DC

## 2016-08-27 MED ORDER — SODIUM CHLORIDE 0.9 % IV SOLN
INTRAVENOUS | Status: DC
  Administered 2016-08-27 – 2016-08-29 (×6): via INTRAVENOUS

## 2016-08-27 MED ORDER — ENOXAPARIN SODIUM 60 MG/0.6ML ~~LOC~~ SOLN
60.0000 mg | Freq: Every day | SUBCUTANEOUS | Status: DC
Start: 2016-08-27 — End: 2016-08-29
  Administered 2016-08-27 – 2016-08-29 (×3): 60 mg via SUBCUTANEOUS
  Filled 2016-08-27 (×3): qty 0.6

## 2016-08-27 MED ORDER — LORAZEPAM 0.5 MG PO TABS: 0.5000 mg | ORAL_TABLET | Freq: Every evening | ORAL | Status: DC | PRN

## 2016-08-27 MED ORDER — DEXTROSE 5 % IV SOLN
2.0000 g | Freq: Three times a day (TID) | INTRAVENOUS | Status: DC
  Administered 2016-08-27 – 2016-08-29 (×7): 2 g via INTRAVENOUS
  Filled 2016-08-27 (×8): qty 2

## 2016-08-27 MED ORDER — VANCOMYCIN HCL IN DEXTROSE 1-5 GM/200ML-% IV SOLN
1000.0000 mg | INTRAVENOUS | Status: AC
  Administered 2016-08-27: 1000 mg via INTRAVENOUS
  Filled 2016-08-27: qty 200

## 2016-08-27 MED ORDER — LORATADINE 10 MG PO TABS: 10.0000 mg | ORAL_TABLET | Freq: Every day | ORAL | Status: DC

## 2016-08-27 MED ORDER — ACETAMINOPHEN 500 MG PO TABS
500.0000 mg | ORAL_TABLET | Freq: Four times a day (QID) | ORAL | Status: DC | PRN
  Administered 2016-08-27 – 2016-08-29 (×4): 500 mg via ORAL
  Filled 2016-08-27 (×5): qty 1

## 2016-08-27 MED ORDER — HYDRALAZINE HCL 20 MG/ML IJ SOLN: 5.0000 mg | INTRAMUSCULAR | Status: DC | PRN

## 2016-08-27 MED ORDER — HYDROCORTISONE 2.5 % RE CREA
TOPICAL_CREAM | Freq: Two times a day (BID) | RECTAL | Status: DC
  Administered 2016-08-27 – 2016-08-28 (×4): via RECTAL
  Administered 2016-08-28: 1 via RECTAL
  Administered 2016-08-29: 09:00:00 via RECTAL
  Filled 2016-08-27: qty 28.35

## 2016-08-27 MED ORDER — POTASSIUM CHLORIDE 20 MEQ/15ML (10%) PO SOLN
20.0000 meq | Freq: Once | ORAL | Status: AC
  Administered 2016-08-27: 20 meq via ORAL
  Filled 2016-08-27: qty 15

## 2016-08-27 MED ORDER — TRAZODONE HCL 50 MG PO TABS
25.0000 mg | ORAL_TABLET | Freq: Every evening | ORAL | Status: DC | PRN
  Administered 2016-08-27: 50 mg via ORAL
  Filled 2016-08-27 (×2): qty 1

## 2016-08-27 MED ORDER — ONDANSETRON HCL 4 MG/2ML IJ SOLN
4.0000 mg | Freq: Three times a day (TID) | INTRAMUSCULAR | Status: DC | PRN
  Administered 2016-08-27: 4 mg via INTRAVENOUS
  Filled 2016-08-27: qty 2

## 2016-08-27 MED ORDER — LIDOCAINE-PRILOCAINE 2.5-2.5 % EX CREA: TOPICAL_CREAM | Freq: Every day | CUTANEOUS | Status: DC | PRN

## 2016-08-27 MED ORDER — DOCUSATE SODIUM 100 MG PO CAPS
200.0000 mg | ORAL_CAPSULE | Freq: Two times a day (BID) | ORAL | Status: DC
  Administered 2016-08-27 – 2016-08-29 (×5): 200 mg via ORAL
  Filled 2016-08-27 (×5): qty 2

## 2016-08-27 MED ORDER — VALACYCLOVIR HCL 500 MG PO TABS
500.0000 mg | ORAL_TABLET | Freq: Three times a day (TID) | ORAL | Status: DC
  Filled 2016-08-27: qty 1

## 2016-08-27 MED ORDER — ZOLPIDEM TARTRATE 5 MG PO TABS: 5.0000 mg | ORAL_TABLET | Freq: Every evening | ORAL | Status: DC | PRN

## 2016-08-27 MED ORDER — SODIUM CHLORIDE 0.9% FLUSH
10.0000 mL | INTRAVENOUS | Status: DC | PRN
  Administered 2016-08-29: 10 mL
  Filled 2016-08-27: qty 40

## 2016-08-27 MED ORDER — SODIUM CHLORIDE 0.9 % IV BOLUS (SEPSIS)
1000.0000 mL | Freq: Once | INTRAVENOUS | Status: AC
  Administered 2016-08-27: 1000 mL via INTRAVENOUS

## 2016-08-27 NOTE — Progress Notes (Signed)
CRITICAL VALUE ALERT  Critical Value:  Lactic acid 2.2  Date & Time Notied:  08/27/16, 1352  Provider Notified: Dr. Teryl Lucy  Orders Received/Actions taken: Orders received

## 2016-08-27 NOTE — Progress Notes (Signed)
Patient seen and examined at bedside, patient admitted after midnight, please see earlier detailed admission note by Ivor Costa, MD. Briefly, patient presented with fevers, chills and a rash and found to be neutropenic. She was started on empiric treatment including treatment for possible shingles. [name] called to see patient. Patient given Neulasta on 08/17/2016.   Cordelia Poche, MD [facility] 08/27/2016, 11:03 AM Pager: [phone]

## 2016-08-27 NOTE — Progress Notes (Signed)
Patient just arrived from [facility]. Admitting doctor notified.

## 2016-08-27 NOTE — Progress Notes (Signed)
Pharmacy Antibiotic Note  [name] is a 46 y.o. female with hx of breast ca admitted on 08/26/2016 with neutropenic fever .  Pharmacy has been consulted for cefepime and vancomycin dosing.  Plan: Cefepime 2 Gm IV q8h Vancomycin 1 GM x1 ED + 1 Gm = 2 Gm load then 1 Gm IV q8h VT=15-20 mg/L Daily Scr Adjusted Lovenox to 60 mg daily (~0.5 mg/kg) in pt with BMI>30 F/u scr/cultures/levels as needed  Height: 5' 7.5" (171.5 cm) Weight: 253 lb (114.8 kg) IBW/kg (Calculated) : 62.75  Temp (24hrs), Avg:99.3 F (37.4 C), Min:98.5 F (36.9 C), Max:100.1 F (37.8 C)   Recent Labs Lab 08/24/16 1152 08/24/16 1152 08/26/16 2100 08/26/16 2102 08/26/16 2355  WBC 0.6*  --  1.7*  --   --   CREATININE  --  0.7 0.64  --   --   LATICACIDVEN  --   --   --  3.24* 2.48*    Estimated Creatinine Clearance: 114.7 mL/min (by C-G formula based on SCr of 0.64 mg/dL).    No Known Allergies  Antimicrobials this admission: 8/22 cefepime >>  8/23 vancomycin >>   Dose adjustments this admission:   Microbiology results:  BCx:   UCx:    Sputum:    MRSA PCR:   Thank you for allowing pharmacy to be a part of this patient's care.  Dorrene German 08/27/2016 2:07 AM

## 2016-08-27 NOTE — Progress Notes (Signed)
[name]   DOB:10/28/1970   WC#:[id number]   VOH#:[id number]  Subjective:  [name] did well with cycle 3 of her chemo, then developed diarrhea (24 hrs) low-grade fever (masked by tylenol) and bony aches-- which she had not had with previous chemotherapy cycles. She started cipro prophylactcally 08/19. Denies other symptoms on extensive ROS. Husband not in room   Objective: young appearing White woman exmained in bed Vitals:   08/27/16 0618 08/27/16 1431  BP: 110/65 115/65  Pulse: 90 89  Resp: (!) 21 20  Temp: 98.5 F (36.9 C) 97.8 F (36.6 C)  SpO2: 98% 98%    Body mass index is 40.74 kg/m.  Intake/Output Summary (Last 24 hours) at 08/27/16 1917 Last data filed at 08/27/16 1500  Gross per 24 hour  Intake          1362.25 ml  Output              800 ml  Net           562.25 ml     Sclerae unicteric  Oropharynx shows no thrush or other lesions  No cervical or supraclavicular adenopathy  Lungs no rales or wheezes--auscultated anterolaterally  Heart regular rate and rhythm  Abdomen soft, +BS  Neuro nonfocal  Breast exam: deferred  CBG (last 3)  No results for input(s): GLUCAP in the last 72 hours.   Labs:  Lab Results  Component Value Date   WBC 1.6 (L) 08/27/2016   HGB 8.4 (L) 08/27/2016   HCT 24.7 (L) 08/27/2016   MCV 83.7 08/27/2016   PLT 110 (L) 08/27/2016   NEUTROABS 0.2 (L) 08/26/2016    @LASTCHEMISTRY @  Urine Studies No results for input(s): UHGB, CRYS in the last 72 hours.  Invalid input(s): UACOL, UAPR, USPG, UPH, UTP, UGL, UKET, UBIL, UNIT, UROB, ULEU, UEPI, UWBC, URBC, UBAC, CAST, UCOM, [place]  Basic Metabolic Panel:  Recent Labs Lab 08/24/16 1152 08/26/16 2100 08/27/16 0448  NA 138 136 140  K 3.4* 3.5 3.4*  CL  --  102 109  CO2 29 25 27   GLUCOSE 163* 144* 113*  BUN 8.0 5* <5*  CREATININE 0.7 0.64 0.51  CALCIUM 9.1 8.5* 7.8*   GFR Estimated Creatinine Clearance: 117.5 mL/min (by C-G formula based on SCr of 0.51 mg/dL). Liver Function  Tests:  Recent Labs Lab 08/24/16 1152 08/26/16 2100  AST 14 24  ALT 14 14  ALKPHOS 96 89  BILITOT 0.90 0.4  PROT 6.6 6.7  ALBUMIN 3.3* 3.4*   No results for input(s): LIPASE, AMYLASE in the last 168 hours. No results for input(s): AMMONIA in the last 168 hours. Coagulation profile  Recent Labs Lab 08/26/16 2100  INR 0.90    CBC:  Recent Labs Lab 08/24/16 1152 08/26/16 2100 08/27/16 0448  WBC 0.6* 1.7* 1.6*  NEUTROABS 0.3* 0.2*  --   HGB 10.7* 8.1* 8.4*  HCT 31.6* 24.3* 24.7*  MCV 83.1 85.3 83.7  PLT 139* 179 110*   Cardiac Enzymes: No results for input(s): CKTOTAL, CKMB, CKMBINDEX, TROPONINI in the last 168 hours. BNP: Invalid input(s): POCBNP CBG: No results for input(s): GLUCAP in the last 168 hours. D-Dimer No results for input(s): DDIMER in the last 72 hours. Hgb A1c No results for input(s): HGBA1C in the last 72 hours. Lipid Profile No results for input(s): CHOL, HDL, LDLCALC, TRIG, CHOLHDL, LDLDIRECT in the last 72 hours. Thyroid function studies No results for input(s): TSH, T4TOTAL, T3FREE, THYROIDAB in the last 72 hours.  Invalid input(s):  FREET3 Anemia work up No results for input(s): VITAMINB12, FOLATE, FERRITIN, TIBC, IRON, RETICCTPCT in the last 72 hours. Microbiology Recent Results (from the past 240 hour(s))  Culture, blood (Routine x 2)     Status: None (Preliminary result)   Collection Time: 08/26/16  8:50 PM  Result Value Ref Range Status   Specimen Description BLOOD RIGHT PORTA CATH  Final   Special Requests   Final    BOTTLES DRAWN AEROBIC AND ANAEROBIC Blood Culture adequate volume   Culture   Final    NO GROWTH < 12 HOURS Performed at [facility], [address], [place], [place] [address]    Report Status PENDING  Incomplete  Culture, blood (Routine x 2)     Status: None (Preliminary result)   Collection Time: 08/26/16  9:06 PM  Result Value Ref Range Status   Specimen Description BLOOD LEFT WRIST  Final   Special  Requests   Final    BOTTLES DRAWN AEROBIC AND ANAEROBIC Blood Culture adequate volume   Culture   Final    NO GROWTH < 12 HOURS Performed at [facility], [place] [address], [place], [place] [address]    Report Status PENDING  Incomplete  Respiratory Panel by PCR     Status: None   Collection Time: 08/27/16  1:58 AM  Result Value Ref Range Status   Adenovirus NOT DETECTED NOT DETECTED Final   Coronavirus 229E NOT DETECTED NOT DETECTED Final   Coronavirus HKU1 NOT DETECTED NOT DETECTED Final   Coronavirus NL63 NOT DETECTED NOT DETECTED Final   Coronavirus OC43 NOT DETECTED NOT DETECTED Final   Metapneumovirus NOT DETECTED NOT DETECTED Final   Rhinovirus / Enterovirus NOT DETECTED NOT DETECTED Final   Influenza A NOT DETECTED NOT DETECTED Final   Influenza B NOT DETECTED NOT DETECTED Final   Parainfluenza Virus 1 NOT DETECTED NOT DETECTED Final   Parainfluenza Virus 2 NOT DETECTED NOT DETECTED Final   Parainfluenza Virus 3 NOT DETECTED NOT DETECTED Final   Parainfluenza Virus 4 NOT DETECTED NOT DETECTED Final   Respiratory Syncytial Virus NOT DETECTED NOT DETECTED Final   Bordetella pertussis NOT DETECTED NOT DETECTED Final   Chlamydophila pneumoniae NOT DETECTED NOT DETECTED Final   Mycoplasma pneumoniae NOT DETECTED NOT DETECTED Final    Comment: Performed at [facility], [place] [address] [place], [place] [address]      Studies:  Dg Chest 2 View  Result Date: 08/26/2016 CLINICAL DATA:  Fever, active chemotherapy for breast cancer. EXAM: CHEST  2 VIEW COMPARISON:  Chest CT 07/25/2016 FINDINGS: Right chest port with tip in the mid SVC. The cardiomediastinal contours are normal. The lungs are clear. Pulmonary vasculature is normal. No consolidation, pleural effusion, or pneumothorax. No acute osseous abnormalities are seen. IMPRESSION: No acute abnormality.  Right chest port in place. Electronically Signed   By: Jeb Levering M.D.   On: 08/26/2016 21:43    Assessment:  46 y.o. [place] woman admitted with febrile neutropenia, currently day 11 cycle 3 of cytoxan/ adriamycin, day 10 neulasta  (0) status post left breast upper outer quadrant and left axillary lymph node biopsy 06/19/2016, both positive for a clinical T2-T3 N1, stage IIIB-C invasive ductal carcinoma, grade 3, triple negative, with an MIB-1 of 90%  (1) genetics testing 08/04/2016  (2) neoadjuvant chemotherapy to consist of doxorubicin and cyclophosphamide in dose dense fashion 4 followed by paclitaxel weekly 12 given with carboplatin starting 07/20/2016  (3) definitive surgery to follow  (  4) adjuvant radiation to follow surgery    Plan:  Given the brief diarrhea and the bony aches, in absence of localizing symptoms, most likely we are dealing with a viral illness. Note she was on cipro at the time of fever.  Counts are rising as c/w 3 days ago and should be normalizing soon. No need for neupogen  Sh is scheduled for cycle 4 on 08/27 but this will be delayed a week.  Ancipate d/c to home soon, likely SAT.   Will follow with you   Chauncey Cruel, MD 08/27/2016  7:17 PM Medical Oncology and Hematology [facility] [address] [place], [place] [address] Tel. [phone]    Fax. [phone]

## 2016-08-27 NOTE — H&P (Addendum)
History and Physical    [name] DPO:[id number] DOB: [birth year] DOA: 08/26/2016  Referring MD/NP/PA:   PCP: [name], MD   Patient coming from:  The patient is coming from home.  At baseline, pt is independent for most of ADL.   Chief Complaint: Fever, chills, rash  HPI: [name] is a 46 y.o. female with medical history significant of hypertension, prostate cancer, anemia, who presents with fever, chills and rash.  Patient states that she just complicated a course of chemotherapy on last Monday. She taking Cipro for prophylaxis purposes. She developed fever and chills today. She has a dry cough and shortness of breath on exertion, but no chest pain. Denies tenderness in the calf area. She has a runny nose, but no sore throat. Patient states that she had diarrhea 2 days ago which has resolved. Currently no nausea, vomiting, abdominal pain or diarrhea. No symptoms of UTI. Patient states that she noted rashes in both arms 2 days ago. The rashes are erythematous, with burning sensation, but no pain or itches.  ED Course: pt was found to have WBC 1.7, hemoglobin 8.1, lactic acid 3.24-->2.48, INR 0.90, negative urinalysis, potassium 3.4, creatinine normal, temperature 100.1, tachycardia, tachypnea, O2 sat are 97% on room air, negative chest x-ray. Patient is admitted to telemetry bed as inpatient.  Review of Systems:   General: has fevers, chills, no body weight gain, has poor appetite, has fatigue HEENT: no blurry vision, hearing changes or sore throat Respiratory: no dyspnea, has coughing, no wheezing CV: no chest pain, no palpitations GI: no nausea, vomiting, abdominal pain, diarrhea, constipation GU: no dysuria, burning on urination, increased urinary frequency, hematuria  Ext: no leg edema Neuro: no unilateral weakness, numbness, or tingling, no vision change or hearing loss Skin: has rash, no skin tear. MSK: No muscle spasm, no deformity, no limitation of range of  movement in spin Heme: No easy bruising.  Travel history: No recent long distant travel.  Allergy: No Known Allergies  Past Medical History:  Diagnosis Date  . Malignant neoplasm of upper-outer quadrant of left breast in female, estrogen receptor negative ([place]) 06/23/2016    Past Surgical History:  Procedure Laterality Date  . ANKLE SURGERY    . IR FLUORO GUIDE PORT INSERTION RIGHT  07/14/2016  . IR US GUIDE VASC ACCESS RIGHT  07/14/2016  . TUBAL LIGATION      Social History:  reports that she has never smoked. She has never used smokeless tobacco. She reports that she does not drink alcohol or use drugs.  Family History:  Family History  Problem Relation Age of Onset  . Diabetes Mother   . Hypertension Mother   . Arthritis Mother   . Colon cancer Father 67       d.76 metastatic at time of diagnosis  . Diabetes Father   . Heart disease Father   . Hypertension Father   . Breast cancer Maternal Grandmother 4       d.60s  . Breast cancer Maternal Aunt 52  . Cervical cancer Maternal Aunt 66  . Breast cancer Maternal Aunt 46       d.50s  . Cancer Maternal Uncle        d.62s unspecified type of cancer  . Breast cancer Cousin 84       paternal first-cousin (daughter of unaffected aunt)     Prior to Admission medications   Medication Sig Start Date End Date Taking? Authorizing Provider  acetaminophen (TYLENOL) 500 MG tablet Take  1,000 mg by mouth every 6 (six) hours as needed. For fever/pain    [provider]  ciprofloxacin (CIPRO) 500 MG tablet Take 1 tablet (500 mg total) by mouth 2 (two) times daily. 07/28/16   Magrinat, [name], MD  dexamethasone (DECADRON) 4 MG tablet Take 1 tablet (4 mg total) by mouth 2 (two) times daily. Take 2 tablets by mouth once a day on the day after chemotherapy and then take 2 tablets two times a day for 2 days. Take with food. 08/11/16   Magrinat, [name], MD  docusate sodium (COLACE) 100 MG capsule Take 1 capsule (100 mg total) by  mouth 2 (two) times daily. 07/27/16   [name], MD  hydrochlorothiazide (HYDRODIURIL) 25 MG tablet Take 0.5 tablets (12.5 mg total) by mouth daily. 06/12/16   [name], MD  lidocaine-prilocaine (EMLA) cream Apply to affected area once 07/17/16   Magrinat, [name], MD  loratadine (CLARITIN) 10 MG tablet Take 1 tablet (10 mg total) by mouth daily. 07/17/16   Magrinat, [name], MD  LORazepam (ATIVAN) 0.5 MG tablet Take 1 tablet (0.5 mg total) by mouth at bedtime as needed (Nausea or vomiting). 07/17/16   Magrinat, [name], MD  prochlorperazine (COMPAZINE) 10 MG tablet Take 1 tablet (10 mg total) by mouth every 6 (six) hours as needed (Nausea or vomiting). 07/17/16   Magrinat, [name], MD  traZODone (DESYREL) 50 MG tablet Take 0.5-1 tablets (25-50 mg total) by mouth at bedtime as needed for sleep. 06/23/16   [name], MD    Physical Exam: Vitals:   08/26/16 2345 08/26/16 2352 08/27/16 0130 08/27/16 0138  BP:   129/73   Pulse: 100  96   Resp: (!) 28  16   Temp:  99.5 F (37.5 C) 99.2 F (37.3 C)   TempSrc:  Oral Oral   SpO2: 98%  97%   Weight:      Height:    5' 7.5" (1.715 m)   General: Not in acute distress HEENT:       Eyes: PERRL, EOMI, no scleral icterus.       ENT: No discharge from the ears and nose, no pharynx injection, no tonsillar enlargement.        Neck: No JVD, no bruit, no mass felt. Heme: No neck lymph node enlargement. Cardiac: S1/S2, RRR, No murmurs, No gallops or rubs. Respiratory:  No rales, wheezing, rhonchi or rubs. GI: Soft, nondistended, nontender, no rebound pain, no organomegaly, BS present. GU: No hematuria Ext: No pitting leg edema bilaterally. 2+DP/PT pulse bilaterally. Musculoskeletal: No joint deformities, No joint redness or warmth, no limitation of ROM in spin. Skin: has erythematous rashes in both arms. Neuro: Alert, oriented X3, cranial nerves II-XII grossly intact, moves all extremities normally. Psych: Patient is not  psychotic, no suicidal or hemocidal ideation.  Labs on Admission: I have personally reviewed following labs and imaging studies  CBC:  Recent Labs Lab 08/24/16 1152 08/26/16 2100  WBC 0.6* 1.7*  NEUTROABS 0.3* 0.2*  HGB 10.7* 8.1*  HCT 31.6* 24.3*  MCV 83.1 85.3  PLT 139* 101   Basic Metabolic Panel:  Recent Labs Lab 08/24/16 1152 08/26/16 2100  NA 138 136  K 3.4* 3.5  CL  --  102  CO2 29 25  GLUCOSE 163* 144*  BUN 8.0 5*  CREATININE 0.7 0.64  CALCIUM 9.1 8.5*   GFR: Estimated Creatinine Clearance: 114.7 mL/min (by C-G formula based on SCr of 0.64 mg/dL).  Liver Function Tests:  Recent Labs Lab 08/24/16 1152 08/26/16 2100  AST 14 24  ALT 14 14  ALKPHOS 96 89  BILITOT 0.90 0.4  PROT 6.6 6.7  ALBUMIN 3.3* 3.4*   No results for input(s): LIPASE, AMYLASE in the last 168 hours. No results for input(s): AMMONIA in the last 168 hours. Coagulation Profile:  Recent Labs Lab 08/26/16 2100  INR 0.90   Cardiac Enzymes: No results for input(s): CKTOTAL, CKMB, CKMBINDEX, TROPONINI in the last 168 hours. BNP (last 3 results) No results for input(s): PROBNP in the last 8760 hours. HbA1C: No results for input(s): HGBA1C in the last 72 hours. CBG: No results for input(s): GLUCAP in the last 168 hours. Lipid Profile: No results for input(s): CHOL, HDL, LDLCALC, TRIG, CHOLHDL, LDLDIRECT in the last 72 hours. Thyroid Function Tests: No results for input(s): TSH, T4TOTAL, FREET4, T3FREE, THYROIDAB in the last 72 hours. Anemia Panel: No results for input(s): VITAMINB12, FOLATE, FERRITIN, TIBC, IRON, RETICCTPCT in the last 72 hours. Urine analysis:    Component Value Date/Time   COLORURINE YELLOW 08/26/2016 [place] 08/26/2016 2203   LABSPEC 1.006 08/26/2016 2203   PHURINE 6.0 08/26/2016 2203   GLUCOSEU NEGATIVE 08/26/2016 2203   HGBUR NEGATIVE 08/26/2016 2203   BILIRUBINUR NEGATIVE 08/26/2016 2203   KETONESUR NEGATIVE 08/26/2016 2203    PROTEINUR NEGATIVE 08/26/2016 2203   UROBILINOGEN 0.2 01/04/2010 1911   NITRITE NEGATIVE 08/26/2016 2203   LEUKOCYTESUR NEGATIVE 08/26/2016 2203   Sepsis Labs: @LABRCNTIP (procalcitonin:4,lacticidven:4) )No results found for this or any previous visit (from the past 240 hour(s)).   Radiological Exams on Admission: Dg Chest 2 View  Result Date: 08/26/2016 CLINICAL DATA:  Fever, active chemotherapy for breast cancer. EXAM: CHEST  2 VIEW COMPARISON:  Chest CT 07/25/2016 FINDINGS: Right chest port with tip in the mid SVC. The cardiomediastinal contours are normal. The lungs are clear. Pulmonary vasculature is normal. No consolidation, pleural effusion, or pneumothorax. No acute osseous abnormalities are seen. IMPRESSION: No acute abnormality.  Right chest port in place. Electronically Signed   By: Jeb Levering M.D.   On: 08/26/2016 21:43   Mr Jeri Cos RF Contrast  Result Date: 08/26/2016 CLINICAL DATA:  Breast cancer with headache. Abnormal recent CT scan. EXAM: MRI HEAD WITHOUT AND WITH CONTRAST TECHNIQUE: Multiplanar, multiecho pulse sequences of the brain and surrounding structures were obtained without and with intravenous contrast. CONTRAST:  25mL MULTIHANCE GADOBENATE DIMEGLUMINE 529 MG/ML IV SOLN COMPARISON:  None. FINDINGS: Brain: The midline structures are normal. There is no focal diffusion restriction to indicate acute infarct. There are 1-2 scattered foci of hyperintense T2 weighted signal in the frontal white matter, which may be seen in the setting of migraine headaches or early chronic microvascular disease; however, it is also seen in normal patients of this age. Parenchymal signal is otherwise normal. Specifically, there is no abnormality of the pons. No intraparenchymal hematoma or chronic microhemorrhage. Brain volume is normal for age without lobar predominant atrophy. The dura is normal and there is no extra-axial collection. Vascular: Major intracranial arterial and venous sinus  flow voids are preserved. Skull and upper cervical spine: The visualized skull base, calvarium, upper cervical spine and extracranial soft tissues are normal. Sinuses/Orbits: No fluid levels or advanced mucosal thickening. No mastoid or middle ear effusion. Normal orbits. Specifically, no abnormality of the orbital floor. IMPRESSION: Normal MRI of the brain. Electronically Signed   By: Ulyses Jarred M.D.   On: 08/26/2016 01:04  EKG: Not done in ED, will get one.   Assessment/Plan Principal Problem:   Neutropenic fever ([place]) Active Problems:   Essential hypertension   Malignant neoplasm of upper-outer quadrant of left breast in female, estrogen receptor negative (HCC)   Sepsis (HCC)   Rash   Hypokalemia   Neutropenic fever and sepsis: WBC 1.7. Source of infection not clear. Urinalysis negative. Chest x-ray negative. Patient admits Christopher sepsis with tachycardia, tachypnea, fever and neutropenia. Lactic acid elevated. Hemodynamically stable currently.  - will admit to tele bed as inpt - started vancomycin and cefepime - blood culture x2 and urine culutre - Check respiratory virus panel due to runny nose - will get Procalcitonin and trend lactic acid level per sepsis protocol  -IVF: 3.5 L of NS bolus, followed by 125 cc/h  Essential hypertension: -hold HCTZ -IV hydralazine when necessary  Malignant neoplasm of upper-outer quadrant of left breast in female, estrogen receptor negative ([place]): Complicated course course of chemotherapy 2 weeks ago. Followed up with [name]  -Oncology was consulted by EDP (ED physician did not record oncologist name), will see in AM -will add [name] to treatment team  Rashes in arms: Unclear etiology. Patient states that her rashes are burning.  -start Valtrex empirically for possible shingles due to neutropenic fever and sepsis.  Hypokalemia: K=3.4 on admission. - Repleted   DVT ppx: SQ Lovenox Code Status: Full code Family  Communication:  Yes, patient's husband at bed side Disposition Plan:  Anticipate discharge back to previous home environment Consults called:  Oncology was consulted by EDP (ED physician did not record oncologist name. Admission status:  Inpatient/tele     Date of Service 08/27/2016    Ivor Costa [facility] Pager [phone]  If 7PM-7AM, please contact night-coverage [email or web address] Password [facility] 08/27/2016, 4:35 AM

## 2016-08-28 ENCOUNTER — Other Ambulatory Visit: Payer: Self-pay | Admitting: Emergency Medicine

## 2016-08-28 DIAGNOSIS — Z171 Estrogen receptor negative status [ER-]: Principal | ICD-10-CM

## 2016-08-28 DIAGNOSIS — C50412 Malignant neoplasm of upper-outer quadrant of left female breast: Secondary | ICD-10-CM

## 2016-08-28 LAB — BASIC METABOLIC PANEL
ANION GAP: 4 — AB (ref 5–15)
CALCIUM: 8.2 mg/dL — AB (ref 8.9–10.3)
CO2: 26 mmol/L (ref 22–32)
Chloride: 112 mmol/L — ABNORMAL HIGH (ref 101–111)
Creatinine, Ser: 0.55 mg/dL (ref 0.44–1.00)
GFR calc Af Amer: >60 mL/min (ref >60)
GFR calc non Af Amer: >60 mL/min (ref >60)
GLUCOSE: 96 mg/dL (ref 65–99)
Potassium: 3.7 mmol/L (ref 3.5–5.1)
SODIUM: 142 mmol/L (ref 135–145)

## 2016-08-28 LAB — CBC WITH DIFFERENTIAL/PLATELET
Basophils Absolute: 0.1 10*3/uL (ref 0.0–0.1)
Basophils Relative: 2 %
EOS ABS: 0 10*3/uL (ref 0.0–0.7)
Eosinophils Relative: 0 %
HCT: 25.7 % — ABNORMAL LOW (ref 36.0–46.0)
HEMOGLOBIN: 8.8 g/dL — AB (ref 12.0–15.0)
Lymphocytes Relative: 26 %
Lymphs Abs: 0.7 10*3/uL (ref 0.7–4.0)
MCH: 28.9 pg (ref 26.0–34.0)
MCHC: 34.2 g/dL (ref 30.0–36.0)
MCV: 84.3 fL (ref 78.0–100.0)
MONO ABS: 0.6 10*3/uL (ref 0.1–1.0)
Monocytes Relative: 24 %
NEUTROS ABS: 1.3 10*3/uL — AB (ref 1.7–7.7)
NRBC: 2 /100{WBCs} — AB
Neutrophils Relative %: 48 %
Platelets: 115 10*3/uL — ABNORMAL LOW (ref 150–400)
RBC: 3.05 MIL/uL — ABNORMAL LOW (ref 3.87–5.11)
RDW: 14.6 % (ref 11.5–15.5)
WBC: 2.7 10*3/uL — AB (ref 4.0–10.5)

## 2016-08-28 LAB — LACTIC ACID, PLASMA: LACTIC ACID, VENOUS: 1.6 mmol/L (ref 0.5–1.9)

## 2016-08-28 LAB — URINE CULTURE: Culture: NO GROWTH

## 2016-08-28 MED ORDER — DIPHENHYDRAMINE HCL 25 MG PO CAPS
25.0000 mg | ORAL_CAPSULE | ORAL | Status: DC | PRN
  Administered 2016-08-29: 25 mg via ORAL
  Filled 2016-08-28: qty 1

## 2016-08-28 NOTE — Progress Notes (Signed)
PROGRESS NOTE    [name]  TGY:[id number] DOB: 1970-12-09 DOA: 08/26/2016 PCP: [name], MD   Brief Narrative: [name] is a 46 y.o. female with a history of breast cancer currently undergoing chemotherapy,n, anemia. Patient presented with fever, chills, and rash. She is found to be neutropenic and started on empiric antibiotics.   Assessment & Plan:   Principal Problem:   Neutropenic fever ([place]) Active Problems:   Essential hypertension   Malignant neoplasm of upper-outer quadrant of left breast in female, estrogen receptor negative (HCC)   Sepsis ([place])   Rash   Hypokalemia   Neutropenic fever SIRS Unknown source. Possibly secondary to viral illness (if so, likely viral GI illness). Neutrophils improved. Afebrile in last 24 hours while on antibiotics -continue vancomycin and cefepime -blood cultures pending  Essential hypertension Patient states this started during a time of stress for her. She has never had high blood pressure otherwise. Blood pressure very controlled here. -discontinue hydrochlorothiazide on discharge  Malignant neoplasm upper-outer quadrant of left breast Follows with [name]. Patient undergoing chemotherapy.  Rash Initially thought to be secondary to shingles. Since this is bilateral in the setting of patient receiving doxorubicin, would favor photosensitivity rash rather than shingles. -Discontinue Valtrex  Hypokalemia Resolved with supplementation  Thrombocytopenia Pelvic showed a 2 by chemotherapy. Patient has had thrombocytopenia dating back to July. Trended up slightly.  DVT prophylaxis: Lovenox Code Status: Full code Family Communication: None at bedside Disposition Plan: Discharge home in 24 hours pending culture results   Consultants:   Medical oncology  Procedures:   None  Antimicrobials:  Vancomycin  Cefepime  Valtrex    Subjective: No problems overnight. Rash  improved.  Objective: Vitals:   08/27/16 0618 08/27/16 1431 08/27/16 2004 08/28/16 0447  BP: 110/65 115/65 131/71 110/71  Pulse: 90 89 83 76  Resp: (!) 21 20 19 20   Temp: 98.5 F (36.9 C) 97.8 F (36.6 C) 99.2 F (37.3 C) (!) 97.5 F (36.4 C)  TempSrc: Oral Oral Oral Oral  SpO2: 98% 98% 99% 97%  Weight:      Height:        Intake/Output Summary (Last 24 hours) at 08/28/16 1332 Last data filed at 08/28/16 1100  Gross per 24 hour  Intake          3558.34 ml  Output                2 ml  Net          3556.34 ml   Filed Weights   08/26/16 2018 08/26/16 2112 08/27/16 0138  Weight: 114.8 kg (253 lb) 114.8 kg (253 lb) 119.7 kg (264 lb)    Examination:  General exam: Appears calm and comfortable Respiratory system: Clear to auscultation. Respiratory effort normal. Cardiovascular system: S1 & S2 heard, RRR. No murmurs, rubs, gallops or clicks. Gastrointestinal system: Abdomen is nondistended, soft and nontender. Normal bowel sounds heard. Central nervous system: Alert and oriented. No focal neurological deficits. Extremities: No edema. No calf tenderness Skin: No cyanosis. Papular rash with surrounding erythema of bilateral forearms Psychiatry: Judgement and insight appear normal. Mood & affect appropriate.     Data Reviewed: I have personally reviewed following labs and imaging studies  CBC:  Recent Labs Lab 08/24/16 1152 08/26/16 2100 08/27/16 0448 08/28/16 0434  WBC 0.6* 1.7* 1.6* 2.7*  NEUTROABS 0.3* 0.2*  --  1.3*  HGB 10.7* 8.1* 8.4* 8.8*  HCT 31.6* 24.3* 24.7* 25.7*  MCV 83.1 85.3 83.7 84.3  PLT 139* 179 110* 536*   Basic Metabolic Panel:  Recent Labs Lab 08/24/16 1152 08/26/16 2100 08/27/16 0448 08/28/16 0434  NA 138 136 140 142  K 3.4* 3.5 3.4* 3.7  CL  --  102 109 112*  CO2 29 25 27 26   GLUCOSE 163* 144* 113* 96  BUN 8.0 5* <5* <5*  CREATININE 0.7 0.64 0.51 0.55  CALCIUM 9.1 8.5* 7.8* 8.2*   GFR: Estimated Creatinine Clearance: 117.5  mL/min (by C-G formula based on SCr of 0.55 mg/dL). Liver Function Tests:  Recent Labs Lab 08/24/16 1152 08/26/16 2100  AST 14 24  ALT 14 14  ALKPHOS 96 89  BILITOT 0.90 0.4  PROT 6.6 6.7  ALBUMIN 3.3* 3.4*   No results for input(s): LIPASE, AMYLASE in the last 168 hours. No results for input(s): AMMONIA in the last 168 hours. Coagulation Profile:  Recent Labs Lab 08/26/16 2100  INR 0.90   Cardiac Enzymes: No results for input(s): CKTOTAL, CKMB, CKMBINDEX, TROPONINI in the last 168 hours. BNP (last 3 results) No results for input(s): PROBNP in the last 8760 hours. HbA1C: No results for input(s): HGBA1C in the last 72 hours. CBG: No results for input(s): GLUCAP in the last 168 hours. Lipid Profile: No results for input(s): CHOL, HDL, LDLCALC, TRIG, CHOLHDL, LDLDIRECT in the last 72 hours. Thyroid Function Tests: No results for input(s): TSH, T4TOTAL, FREET4, T3FREE, THYROIDAB in the last 72 hours. Anemia Panel: No results for input(s): VITAMINB12, FOLATE, FERRITIN, TIBC, IRON, RETICCTPCT in the last 72 hours. Sepsis Labs:  Recent Labs Lab 08/27/16 0221 08/27/16 0448 08/27/16 1258 08/28/16 0434  PROCALCITON <0.10  --   --   --   LATICACIDVEN 1.1 1.7 2.2* 1.6    Recent Results (from the past 240 hour(s))  Culture, blood (Routine x 2)     Status: None (Preliminary result)   Collection Time: 08/26/16  8:50 PM  Result Value Ref Range Status   Specimen Description BLOOD RIGHT PORTA CATH  Final   Special Requests   Final    BOTTLES DRAWN AEROBIC AND ANAEROBIC Blood Culture adequate volume   Culture   Final    NO GROWTH < 12 HOURS Performed at [facility], [place] [address] [place], [place] [address]    Report Status PENDING  Incomplete  Culture, blood (Routine x 2)     Status: None (Preliminary result)   Collection Time: 08/26/16  9:06 PM  Result Value Ref Range Status   Specimen Description BLOOD LEFT WRIST  Final   Special Requests   Final    BOTTLES  DRAWN AEROBIC AND ANAEROBIC Blood Culture adequate volume   Culture   Final    NO GROWTH < 12 HOURS Performed at [facility], [place] [address] [place], [place] [address]    Report Status PENDING  Incomplete  Respiratory Panel by PCR     Status: None   Collection Time: 08/27/16  1:58 AM  Result Value Ref Range Status   Adenovirus NOT DETECTED NOT DETECTED Final   Coronavirus 229E NOT DETECTED NOT DETECTED Final   Coronavirus HKU1 NOT DETECTED NOT DETECTED Final   Coronavirus NL63 NOT DETECTED NOT DETECTED Final   Coronavirus OC43 NOT DETECTED NOT DETECTED Final   Metapneumovirus NOT DETECTED NOT DETECTED Final   Rhinovirus / Enterovirus NOT DETECTED NOT DETECTED Final   Influenza A NOT DETECTED NOT DETECTED Final   Influenza B NOT DETECTED NOT DETECTED Final   Parainfluenza Virus 1 NOT  DETECTED NOT DETECTED Final   Parainfluenza Virus 2 NOT DETECTED NOT DETECTED Final   Parainfluenza Virus 3 NOT DETECTED NOT DETECTED Final   Parainfluenza Virus 4 NOT DETECTED NOT DETECTED Final   Respiratory Syncytial Virus NOT DETECTED NOT DETECTED Final   Bordetella pertussis NOT DETECTED NOT DETECTED Final   Chlamydophila pneumoniae NOT DETECTED NOT DETECTED Final   Mycoplasma pneumoniae NOT DETECTED NOT DETECTED Final    Comment: Performed at [facility], [place] [address], [place], [place] [address]  Urine Culture     Status: None   Collection Time: 08/27/16  5:20 AM  Result Value Ref Range Status   Specimen Description URINE, CLEAN CATCH  Final   Special Requests NONE  Final   Culture   Final    NO GROWTH Performed at [facility], [place] [name] [place], [place] [address]    Report Status 08/28/2016 FINAL  Final         Radiology Studies: Dg Chest 2 View  Result Date: 08/26/2016 CLINICAL DATA:  Fever, active chemotherapy for breast cancer. EXAM: CHEST  2 VIEW COMPARISON:  Chest CT 07/25/2016 FINDINGS: Right chest port with tip in the mid SVC. The cardiomediastinal  contours are normal. The lungs are clear. Pulmonary vasculature is normal. No consolidation, pleural effusion, or pneumothorax. No acute osseous abnormalities are seen. IMPRESSION: No acute abnormality.  Right chest port in place. Electronically Signed   By: Jeb Levering M.D.   On: 08/26/2016 21:43        Scheduled Meds: . docusate sodium  200 mg Oral BID  . enoxaparin (LOVENOX) injection  60 mg Subcutaneous Daily  . hydrocortisone   Rectal BID   Continuous Infusions: . sodium chloride 125 mL/hr at 08/27/16 2215  . ceFEPime (MAXIPIME) IV Stopped (08/28/16 1950)  . vancomycin Stopped (08/28/16 0841)     LOS: 2 days     Cordelia Poche, MD [facility] 08/28/2016, 1:32 PM Pager: [phone]  If 7PM-7AM, please contact night-coverage [email or web address] Password [email or web address] 08/28/2016, 1:32 PM

## 2016-08-28 NOTE — Progress Notes (Signed)
Report received from Caren Griffins., RN patient is in bed currently sleeping. Will continue to monitor.

## 2016-08-29 ENCOUNTER — Other Ambulatory Visit: Payer: Self-pay | Admitting: Oncology

## 2016-08-29 LAB — CREATININE, SERUM: Creatinine, Ser: 0.5 mg/dL (ref 0.44–1.00)

## 2016-08-29 MED ORDER — ACETAMINOPHEN 500 MG PO TABS: 1000.0000 mg | ORAL_TABLET | Freq: Four times a day (QID) | ORAL | Status: DC | PRN

## 2016-08-29 MED ORDER — HEPARIN SOD (PORK) LOCK FLUSH 100 UNIT/ML IV SOLN
500.0000 [IU] | INTRAVENOUS | Status: AC | PRN
  Administered 2016-08-29: 500 [IU]

## 2016-08-29 NOTE — Discharge Instructions (Signed)
[name],  You were admitted for concern of bacterial infection in the setting of low white blood cell counts. Your counts improved and workup was negative for bacterial infection. You can follow-up with your primary care physician and oncologist. Your chemotherapy schedule was adjusted.

## 2016-08-29 NOTE — Discharge Summary (Signed)
Physician Discharge Summary  [name] KXF:[id number] DOB: [birth year] DOA: 08/26/2016  PCP: [name], MD  Admit date: 08/26/2016 Discharge date: 08/29/2016  Admitted From: Home Disposition: Home  Recommendations for Outpatient Follow-up:  1. Follow up with PCP in 1 week 2. Follow up with Oncologist 3. Please obtain CBC in one week  Home Health: None Equipment/Devices: None  Discharge Condition: Stable CODE STATUS: Full code Diet recommendation: Heart healthy   Brief/Interim Summary:  Admission HPI written by Ivor Costa, MD   Chief Complaint: Fever, chills, rash  HPI: [name] is a 46 y.o. female with medical history significant of hypertension, prostate cancer, anemia, who presents with fever, chills and rash.  Patient states that she just complicated a course of chemotherapy on last Monday. She taking Cipro for prophylaxis purposes. She developed fever and chills today. She has a dry cough and shortness of breath on exertion, but no chest pain. Denies tenderness in the calf area. She has a runny nose, but no sore throat. Patient states that she had diarrhea 2 days ago which has resolved. Currently no nausea, vomiting, abdominal pain or diarrhea. No symptoms of UTI. Patient states that she noted rashes in both arms 2 days ago. The rashes are erythematous, with burning sensation, but no pain or itches.  ED Course: pt was found to have WBC 1.7, hemoglobin 8.1, lactic acid 3.24-->2.48, INR 0.90, negative urinalysis, potassium 3.4, creatinine normal, temperature 100.1, tachycardia, tachypnea, O2 sat are 97% on room air, negative chest x-ray. Patient is admitted to telemetry bed as inpatient.   Hospital course:  Neutropenic fever SIRS Unknown source. Possibly secondary to viral illness (if so, likely viral GI illness). Neutrophils improved. Afebrile in last 24 hours while on antibiotics. Vancomycin and cefepime discontinued. Likely secondary to viral illness.  Oncology consulted and agreed. Discharged without antibiotics. Blood cultures negative to date but final is pending.  Essential hypertension Patient states this started during a time of stress for her. She has never had high blood pressure otherwise. Blood pressure very controlled here. Discontinued hydrochlorothiazide at discharge.  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. Patient undergoing chemotherapy.  Rash Initially thought to be secondary to shingles. Since this is bilateral in the setting of patient receiving doxorubicin, would favor photosensitivity rash rather than shingles. Valtrex was started discontinued prior to discharge. Rash improved prior to discharge.  Hypokalemia Resolved with supplementation  Thrombocytopenia Patient has had thrombocytopenia dating back to July. Trended up slightly but stable. Outpatient follow-up.  Discharge Diagnoses:  Principal Problem:   Neutropenic fever ([place]) Active Problems:   Essential hypertension   Malignant neoplasm of upper-outer quadrant of left breast in female, estrogen receptor negative ([place])   Sepsis ([place])   Rash   Hypokalemia    Discharge Instructions  Discharge Instructions    Call MD for:  difficulty breathing, headache or visual disturbances    Complete by:  As directed    Call MD for:  extreme fatigue    Complete by:  As directed    Call MD for:  persistant dizziness or light-headedness    Complete by:  As directed    Call MD for:  persistant nausea and vomiting    Complete by:  As directed    Call MD for:  temperature >100.4    Complete by:  As directed    Increase activity slowly    Complete by:  As directed      Allergies as of 08/29/2016  No Known Allergies     Medication List    STOP taking these medications   ciprofloxacin 500 MG tablet Commonly known as:  CIPRO   hydrochlorothiazide 25 MG tablet Commonly known as:  HYDRODIURIL     TAKE these  medications   acetaminophen 500 MG tablet Commonly known as:  TYLENOL Take 2 tablets (1,000 mg total) by mouth every 6 (six) hours as needed for mild pain. For fever/pain What changed:  when to take this   dexamethasone 4 MG tablet Commonly known as:  DECADRON Take 1 tablet (4 mg total) by mouth 2 (two) times daily. Take 2 tablets by mouth once a day on the day after chemotherapy and then take 2 tablets two times a day for 2 days. Take with food.   docusate sodium 100 MG capsule Commonly known as:  COLACE Take 1 capsule (100 mg total) by mouth 2 (two) times daily. What changed:  how much to take   lidocaine-prilocaine cream Commonly known as:  EMLA Apply to affected area once   loratadine 10 MG tablet Commonly known as:  CLARITIN Take 1 tablet (10 mg total) by mouth daily. What changed:  when to take this   LORazepam 0.5 MG tablet Commonly known as:  ATIVAN Take 1 tablet (0.5 mg total) by mouth at bedtime as needed (Nausea or vomiting).   prochlorperazine 10 MG tablet Commonly known as:  COMPAZINE Take 1 tablet (10 mg total) by mouth every 6 (six) hours as needed (Nausea or vomiting).   traZODone 50 MG tablet Commonly known as:  DESYREL Take 0.5-1 tablets (25-50 mg total) by mouth at bedtime as needed for sleep.            Discharge Care Instructions        Start     Ordered   08/29/16 0000  Increase activity slowly     08/29/16 1030   08/29/16 0000  Call MD for:  temperature >100.4     08/29/16 1030   08/29/16 0000  Call MD for:  persistant nausea and vomiting     08/29/16 1030   08/29/16 0000  Call MD for:  difficulty breathing, headache or visual disturbances     08/29/16 1030   08/29/16 0000  Call MD for:  persistant dizziness or light-headedness     08/29/16 1030   08/29/16 0000  Call MD for:  extreme fatigue     08/29/16 1030   08/29/16 0000  acetaminophen (TYLENOL) 500 MG tablet  Every 6 hours PRN     08/29/16 1030     Follow-up Information     [name], MD. Schedule an appointment as soon as possible for a visit in 1 week(s).   Specialty:  Internal Medicine Contact information: [address] [place] [address] [phone]        Magrinat, [name], MD Follow up.   Specialty:  Oncology Contact information: [place] [address] [phone]          No Known Allergies  Consultations:  Oncology   Procedures/Studies: Dg Chest 2 View  Result Date: 08/26/2016 CLINICAL DATA:  Fever, active chemotherapy for breast cancer. EXAM: CHEST  2 VIEW COMPARISON:  Chest CT 07/25/2016 FINDINGS: Right chest port with tip in the mid SVC. The cardiomediastinal contours are normal. The lungs are clear. Pulmonary vasculature is normal. No consolidation, pleural effusion, or pneumothorax. No acute osseous abnormalities are seen. IMPRESSION: No acute abnormality.  Right chest port  in place. Electronically Signed   By: Jeb Levering M.D.   On: 08/26/2016 21:43   Ct Head Wo Contrast  Result Date: 08/05/2016 CLINICAL DATA:  Headache for 2 weeks. Currently receiving chemotherapy for breast carcinoma EXAM: CT HEAD WITHOUT CONTRAST TECHNIQUE: Contiguous axial images were obtained from the base of the skull through the vertex without intravenous contrast. COMPARISON:  None. FINDINGS: Brain: Ventricles are normal in size and configuration. Mild prominence of the cisterna magna is an anatomic variant. There is no intracranial mass, hemorrhage, midline shift, or extra-axial fluid collection apparent. Decreased attenuation is seen throughout mid pons region. Elsewhere gray-white compartments appear normal. Vascular: No hyperdense vessel. There is modest calcification in the cavernous carotid artery regions bilaterally. Skull: The bony calvarium appears intact. There is symmetric somewhat ground-glass type appearance along each inferior orbital floor. Sinuses/Orbits: There is mild mucosal thickening in several  ethmoid air cells bilaterally. Other visualized paranasal sinuses are clear. Orbits appear symmetric bilaterally. Other: Mastoid air cells are clear. IMPRESSION: 1. Decreased attenuation is seen in the mid pons region. Elsewhere gray-white compartments appear normal. 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 This entity is usually seen following rapid correction of hyponatremia. It has been seen associated with chemotherapy on occasion, however. Given this appearance, correlation with brain MRI pre and post-contrast is advised. This area conceivably could represent edema from a focal metastasis in this area. 2. Elsewhere gray-white compartments appear normal. No mass is seen on noncontrast enhanced study. No hemorrhage. No extra-axial fluid. 3.  Slight calcification in each cavernous carotid artery. 4.  Mild mucosal thickening in each ethmoid air cell. 5. Essentially symmetric ground-glass type opacity in each orbital floor region. The appearance of the bone in this area is atypical for metastatic disease. 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 Particular attention to these areas on MR advised. These results will be called to the ordering clinician or representative by the Radiologist Assistant, and communication documented in the PACS or zVision Dashboard. Electronically Signed   By: Lowella Grip III M.D.   On: 08/05/2016 16:05   Mr Jeri Cos ZT Contrast  Result Date: 08/26/2016 CLINICAL DATA:  Breast cancer with headache. Abnormal recent CT scan. EXAM: MRI HEAD WITHOUT AND WITH CONTRAST TECHNIQUE: Multiplanar, multiecho pulse sequences of the brain and surrounding structures were obtained without and with intravenous contrast. CONTRAST:  35mL MULTIHANCE GADOBENATE DIMEGLUMINE 529 MG/ML IV SOLN COMPARISON:  None. FINDINGS: Brain: The midline structures are normal. There is no focal diffusion  restriction to indicate acute infarct. There are 1-2 scattered foci of hyperintense T2 weighted signal in the frontal white matter, which may be seen in the setting of migraine headaches or early chronic microvascular disease; however, it is also seen in normal patients of this age. Parenchymal signal is otherwise normal. Specifically, there is no abnormality of the pons. No intraparenchymal hematoma or chronic microhemorrhage. Brain volume is normal for age without lobar predominant atrophy. The dura is normal and there is no extra-axial collection. Vascular: Major intracranial arterial and venous sinus flow voids are preserved. Skull and upper cervical spine: The visualized skull base, calvarium, upper cervical spine and extracranial soft tissues are normal. Sinuses/Orbits: No fluid levels or advanced mucosal thickening. No mastoid or middle ear effusion. Normal orbits. Specifically, no abnormality of the orbital floor. IMPRESSION: Normal MRI of the brain. Electronically Signed   By: Ulyses Jarred M.D.   On: 08/26/2016 01:04  Subjective: No chills, sweats  Discharge Exam: Vitals:   08/28/16 2113 08/29/16 0526  BP: 123/72 117/70  Pulse: 84 87  Resp: 20 16  Temp: 98.4 F (36.9 C) 98.6 F (37 C)  SpO2: 100% 97%   Vitals:   08/28/16 0447 08/28/16 1300 08/28/16 2113 08/29/16 0526  BP: 110/71 123/64 123/72 117/70  Pulse: 76 94 84 87  Resp: 20 20 20 16   Temp: (!) 97.5 F (36.4 C) 99.2 F (37.3 C) 98.4 F (36.9 C) 98.6 F (37 C)  TempSrc: Oral Oral Oral Oral  SpO2: 97% 100% 100% 97%  Weight:      Height:        General exam: Appears calm and comfortable Respiratory system: Clear to auscultation. Respiratory effort normal. Cardiovascular system: S1 & S2 heard, RRR. No murmurs, rubs, gallops or clicks. Gastrointestinal system: Abdomen is nondistended, soft and nontender. Normal bowel sounds heard. Central nervous system: Alert and oriented. No focal neurological  deficits. Extremities: No edema. No calf tenderness Skin: No cyanosis. Papular rash with surrounding erythema of bilateral forearms Psychiatry: Judgement and insight appear normal. Mood & affect appropriate.     The results of significant diagnostics from this hospitalization (including imaging, microbiology, ancillary and laboratory) are listed below for reference.     Microbiology: Recent Results (from the past 240 hour(s))  Culture, blood (Routine x 2)     Status: None (Preliminary result)   Collection Time: 08/26/16  8:50 PM  Result Value Ref Range Status   Specimen Description BLOOD RIGHT PORTA CATH  Final   Special Requests   Final    BOTTLES DRAWN AEROBIC AND ANAEROBIC Blood Culture adequate volume   Culture NO GROWTH 3 DAYS  Final   Report Status PENDING  Incomplete  Culture, blood (Routine x 2)     Status: None (Preliminary result)   Collection Time: 08/26/16  9:06 PM  Result Value Ref Range Status   Specimen Description BLOOD LEFT WRIST  Final   Special Requests   Final    BOTTLES DRAWN AEROBIC AND ANAEROBIC Blood Culture adequate volume   Culture NO GROWTH 3 DAYS  Final   Report Status PENDING  Incomplete  Respiratory Panel by PCR     Status: None   Collection Time: 08/27/16  1:58 AM  Result Value Ref Range Status   Adenovirus NOT DETECTED NOT DETECTED Final   Coronavirus 229E NOT DETECTED NOT DETECTED Final   Coronavirus HKU1 NOT DETECTED NOT DETECTED Final   Coronavirus NL63 NOT DETECTED NOT DETECTED Final   Coronavirus OC43 NOT DETECTED NOT DETECTED Final   Metapneumovirus NOT DETECTED NOT DETECTED Final   Rhinovirus / Enterovirus NOT DETECTED NOT DETECTED Final   Influenza A NOT DETECTED NOT DETECTED Final   Influenza B NOT DETECTED NOT DETECTED Final   Parainfluenza Virus 1 NOT DETECTED NOT DETECTED Final   Parainfluenza Virus 2 NOT DETECTED NOT DETECTED Final   Parainfluenza Virus 3 NOT DETECTED NOT DETECTED Final   Parainfluenza Virus 4 NOT DETECTED NOT  DETECTED Final   Respiratory Syncytial Virus NOT DETECTED NOT DETECTED Final   Bordetella pertussis NOT DETECTED NOT DETECTED Final   Chlamydophila pneumoniae NOT DETECTED NOT DETECTED Final   Mycoplasma pneumoniae NOT DETECTED NOT DETECTED Final    Comment: Performed at [facility], [place] [address], [place], [place] [address]  Urine Culture     Status: None   Collection Time: 08/27/16  5:20 AM  Result Value Ref Range Status   Specimen  Description URINE, CLEAN CATCH  Final   Special Requests NONE  Final   Culture   Final    NO GROWTH Performed at [facility], [place] [address], [place], [place] [address]    Report Status 08/28/2016 FINAL  Final     Labs: BNP (last 3 results)  Recent Labs  08/27/16 0221  BNP 34.1   Basic Metabolic Panel:  Recent Labs Lab 08/24/16 1152 08/26/16 2100 08/27/16 0448 08/28/16 0434 08/29/16 0338  NA 138 136 140 142  --   K 3.4* 3.5 3.4* 3.7  --   CL  --  102 109 112*  --   CO2 29 25 27 26   --   GLUCOSE 163* 144* 113* 96  --   BUN 8.0 5* <5* <5*  --   CREATININE 0.7 0.64 0.51 0.55 0.50  CALCIUM 9.1 8.5* 7.8* 8.2*  --    Liver Function Tests:  Recent Labs Lab 08/24/16 1152 08/26/16 2100  AST 14 24  ALT 14 14  ALKPHOS 96 89  BILITOT 0.90 0.4  PROT 6.6 6.7  ALBUMIN 3.3* 3.4*   No results for input(s): LIPASE, AMYLASE in the last 168 hours. No results for input(s): AMMONIA in the last 168 hours. CBC:  Recent Labs Lab 08/24/16 1152 08/26/16 2100 08/27/16 0448 08/28/16 0434  WBC 0.6* 1.7* 1.6* 2.7*  NEUTROABS 0.3* 0.2*  --  1.3*  HGB 10.7* 8.1* 8.4* 8.8*  HCT 31.6* 24.3* 24.7* 25.7*  MCV 83.1 85.3 83.7 84.3  PLT 139* 179 110* 115*   Cardiac Enzymes: No results for input(s): CKTOTAL, CKMB, CKMBINDEX, TROPONINI in the last 168 hours. BNP: Invalid input(s): POCBNP CBG: No results for input(s): GLUCAP in the last 168 hours. D-Dimer No results for input(s): DDIMER in the last 72 hours. Hgb A1c No results for  input(s): HGBA1C in the last 72 hours. Lipid Profile No results for input(s): CHOL, HDL, LDLCALC, TRIG, CHOLHDL, LDLDIRECT in the last 72 hours. Thyroid function studies No results for input(s): TSH, T4TOTAL, T3FREE, THYROIDAB in the last 72 hours.  Invalid input(s): FREET3 Anemia work up No results for input(s): VITAMINB12, FOLATE, FERRITIN, TIBC, IRON, RETICCTPCT in the last 72 hours. Urinalysis    Component Value Date/Time   COLORURINE YELLOW 08/26/2016 2203   APPEARANCEUR CLEAR 08/26/2016 2203   LABSPEC 1.006 08/26/2016 2203   PHURINE 6.0 08/26/2016 2203   GLUCOSEU NEGATIVE 08/26/2016 2203   HGBUR NEGATIVE 08/26/2016 2203   BILIRUBINUR NEGATIVE 08/26/2016 2203   KETONESUR NEGATIVE 08/26/2016 2203   PROTEINUR NEGATIVE 08/26/2016 2203   UROBILINOGEN 0.2 01/04/2010 1911   NITRITE NEGATIVE 08/26/2016 2203   LEUKOCYTESUR NEGATIVE 08/26/2016 2203   Sepsis Labs Invalid input(s): PROCALCITONIN,  WBC,  LACTICIDVEN Microbiology Recent Results (from the past 240 hour(s))  Culture, blood (Routine x 2)     Status: None (Preliminary result)   Collection Time: 08/26/16  8:50 PM  Result Value Ref Range Status   Specimen Description BLOOD RIGHT PORTA CATH  Final   Special Requests   Final    BOTTLES DRAWN AEROBIC AND ANAEROBIC Blood Culture adequate volume   Culture NO GROWTH 3 DAYS  Final   Report Status PENDING  Incomplete  Culture, blood (Routine x 2)     Status: None (Preliminary result)   Collection Time: 08/26/16  9:06 PM  Result Value Ref Range Status   Specimen Description BLOOD LEFT WRIST  Final   Special Requests   Final    BOTTLES DRAWN AEROBIC AND ANAEROBIC  Blood Culture adequate volume   Culture NO GROWTH 3 DAYS  Final   Report Status PENDING  Incomplete  Respiratory Panel by PCR     Status: None   Collection Time: 08/27/16  1:58 AM  Result Value Ref Range Status   Adenovirus NOT DETECTED NOT DETECTED Final   Coronavirus 229E NOT DETECTED NOT DETECTED Final    Coronavirus HKU1 NOT DETECTED NOT DETECTED Final   Coronavirus NL63 NOT DETECTED NOT DETECTED Final   Coronavirus OC43 NOT DETECTED NOT DETECTED Final   Metapneumovirus NOT DETECTED NOT DETECTED Final   Rhinovirus / Enterovirus NOT DETECTED NOT DETECTED Final   Influenza A NOT DETECTED NOT DETECTED Final   Influenza B NOT DETECTED NOT DETECTED Final   Parainfluenza Virus 1 NOT DETECTED NOT DETECTED Final   Parainfluenza Virus 2 NOT DETECTED NOT DETECTED Final   Parainfluenza Virus 3 NOT DETECTED NOT DETECTED Final   Parainfluenza Virus 4 NOT DETECTED NOT DETECTED Final   Respiratory Syncytial Virus NOT DETECTED NOT DETECTED Final   Bordetella pertussis NOT DETECTED NOT DETECTED Final   Chlamydophila pneumoniae NOT DETECTED NOT DETECTED Final   Mycoplasma pneumoniae NOT DETECTED NOT DETECTED Final    Comment: Performed at [facility], [place] [address] [place], [place] [address]  Urine Culture     Status: None   Collection Time: 08/27/16  5:20 AM  Result Value Ref Range Status   Specimen Description URINE, CLEAN CATCH  Final   Special Requests NONE  Final   Culture   Final    NO GROWTH Performed at [facility], [place] [address] [place], [place] [address]    Report Status 08/28/2016 FINAL  Final     SIGNED:   Cordelia Poche, MD [facility] 08/29/2016, 10:32 AM Pager [phone]  If 7PM-7AM, please contact night-coverage [email or web address] Password [email or web address]

## 2016-08-29 NOTE — Progress Notes (Signed)
[name]   DOB:15-Jul-1970   ZD#:[id number]   IRJ#:[id number]  Subjective:  [name] is feeling much better, takinh short walks in her room, sitting up in recliner; no intercurrent fever, cough, phlegm, SOB; discussed brain MRI results;eager to go home   Objective: young appearing White woman exmained in bed Vitals:   08/28/16 2113 08/29/16 0526  BP: 123/72 117/70  Pulse: 84 87  Resp: 20 16  Temp: 98.4 F (36.9 C) 98.6 F (37 C)  SpO2: 100% 97%    Body mass index is 40.74 kg/m.  Intake/Output Summary (Last 24 hours) at 08/29/16 1023 Last data filed at 08/29/16 0200  Gross per 24 hour  Intake          2865.01 ml  Output                6 ml  Net          2859.01 ml    CBG (last 3)  No results for input(s): GLUCAP in the last 72 hours.   Labs:  Lab Results  Component Value Date   WBC 2.7 (L) 08/28/2016   HGB 8.8 (L) 08/28/2016   HCT 25.7 (L) 08/28/2016   MCV 84.3 08/28/2016   PLT 115 (L) 08/28/2016   NEUTROABS 1.3 (L) 08/28/2016    @LASTCHEMISTRY @  Urine Studies No results for input(s): UHGB, CRYS in the last 72 hours.  Invalid input(s): UACOL, UAPR, USPG, UPH, UTP, UGL, UKET, UBIL, UNIT, UROB, ULEU, UEPI, UWBC, URBC, UBAC, CAST, [place], [place]  Basic Metabolic Panel:  Recent Labs Lab 08/24/16 1152  08/26/16 2100 08/27/16 0448 08/28/16 0434 08/29/16 0338  NA 138  --  136 140 142  --   K 3.4*  < > 3.5 3.4* 3.7  --   CL  --   --  102 109 112*  --   CO2 29  --  25 27 26   --   GLUCOSE 163*  --  144* 113* 96  --   BUN 8.0  --  5* <5* <5*  --   CREATININE 0.7  --  0.64 0.51 0.55 0.50  CALCIUM 9.1  --  8.5* 7.8* 8.2*  --   < > = values in this interval not displayed. GFR Estimated Creatinine Clearance: 117.5 mL/min (by C-G formula based on SCr of 0.5 mg/dL). Liver Function Tests:  Recent Labs Lab 08/24/16 1152 08/26/16 2100  AST 14 24  ALT 14 14  ALKPHOS 96 89  BILITOT 0.90 0.4  PROT 6.6 6.7  ALBUMIN 3.3* 3.4*   No results for input(s): LIPASE,  AMYLASE in the last 168 hours. No results for input(s): AMMONIA in the last 168 hours. Coagulation profile  Recent Labs Lab 08/26/16 2100  INR 0.90    CBC:  Recent Labs Lab 08/24/16 1152 08/26/16 2100 08/27/16 0448 08/28/16 0434  WBC 0.6* 1.7* 1.6* 2.7*  NEUTROABS 0.3* 0.2*  --  1.3*  HGB 10.7* 8.1* 8.4* 8.8*  HCT 31.6* 24.3* 24.7* 25.7*  MCV 83.1 85.3 83.7 84.3  PLT 139* 179 110* 115*   Cardiac Enzymes: No results for input(s): CKTOTAL, CKMB, CKMBINDEX, TROPONINI in the last 168 hours. BNP: Invalid input(s): POCBNP CBG: No results for input(s): GLUCAP in the last 168 hours. D-Dimer No results for input(s): DDIMER in the last 72 hours. Hgb A1c No results for input(s): HGBA1C in the last 72 hours. Lipid Profile No results for input(s): CHOL, HDL, LDLCALC, TRIG, CHOLHDL, LDLDIRECT in the last 72 hours. Thyroid function studies No  results for input(s): TSH, T4TOTAL, T3FREE, THYROIDAB in the last 72 hours.  Invalid input(s): FREET3 Anemia work up No results for input(s): VITAMINB12, FOLATE, FERRITIN, TIBC, IRON, RETICCTPCT in the last 72 hours. Microbiology Recent Results (from the past 240 hour(s))  Culture, blood (Routine x 2)     Status: None (Preliminary result)   Collection Time: 08/26/16  8:50 PM  Result Value Ref Range Status   Specimen Description BLOOD RIGHT PORTA CATH  Final   Special Requests   Final    BOTTLES DRAWN AEROBIC AND ANAEROBIC Blood Culture adequate volume   Culture NO GROWTH 3 DAYS  Final   Report Status PENDING  Incomplete  Culture, blood (Routine x 2)     Status: None (Preliminary result)   Collection Time: 08/26/16  9:06 PM  Result Value Ref Range Status   Specimen Description BLOOD LEFT WRIST  Final   Special Requests   Final    BOTTLES DRAWN AEROBIC AND ANAEROBIC Blood Culture adequate volume   Culture NO GROWTH 3 DAYS  Final   Report Status PENDING  Incomplete  Respiratory Panel by PCR     Status: None   Collection Time: 08/27/16   1:58 AM  Result Value Ref Range Status   Adenovirus NOT DETECTED NOT DETECTED Final   Coronavirus 229E NOT DETECTED NOT DETECTED Final   Coronavirus HKU1 NOT DETECTED NOT DETECTED Final   Coronavirus NL63 NOT DETECTED NOT DETECTED Final   Coronavirus OC43 NOT DETECTED NOT DETECTED Final   Metapneumovirus NOT DETECTED NOT DETECTED Final   Rhinovirus / Enterovirus NOT DETECTED NOT DETECTED Final   Influenza A NOT DETECTED NOT DETECTED Final   Influenza B NOT DETECTED NOT DETECTED Final   Parainfluenza Virus 1 NOT DETECTED NOT DETECTED Final   Parainfluenza Virus 2 NOT DETECTED NOT DETECTED Final   Parainfluenza Virus 3 NOT DETECTED NOT DETECTED Final   Parainfluenza Virus 4 NOT DETECTED NOT DETECTED Final   Respiratory Syncytial Virus NOT DETECTED NOT DETECTED Final   Bordetella pertussis NOT DETECTED NOT DETECTED Final   Chlamydophila pneumoniae NOT DETECTED NOT DETECTED Final   Mycoplasma pneumoniae NOT DETECTED NOT DETECTED Final    Comment: Performed at [facility], [place] [address], [place], [place] [address]  Urine Culture     Status: None   Collection Time: 08/27/16  5:20 AM  Result Value Ref Range Status   Specimen Description URINE, CLEAN CATCH  Final   Special Requests NONE  Final   Culture   Final    NO GROWTH Performed at [facility], [place] [address], [place], [place] [address]    Report Status 08/28/2016 FINAL  Final      Studies:  Dg Chest 2 View  Result Date: 08/26/2016 CLINICAL DATA:  Fever, active chemotherapy for breast cancer. EXAM: CHEST  2 VIEW COMPARISON:  Chest CT 07/25/2016 FINDINGS: Right chest port with tip in the mid SVC. The cardiomediastinal contours are normal. The lungs are clear. Pulmonary vasculature is normal. No consolidation, pleural effusion, or pneumothorax. No acute osseous abnormalities are seen. IMPRESSION: No acute abnormality.  Right chest port in place. Electronically Signed   By: Jeb Levering M.D.   On: 08/26/2016 21:43    Ct Head Wo Contrast  Result Date: 08/05/2016 CLINICAL DATA:  Headache for 2 weeks. Currently receiving chemotherapy for breast carcinoma EXAM: CT HEAD WITHOUT CONTRAST TECHNIQUE: Contiguous axial images were obtained from the base of the skull through the vertex without intravenous contrast. COMPARISON:  None. FINDINGS: Brain: Ventricles are normal in size and configuration. Mild prominence of the cisterna magna is an anatomic variant. There is no intracranial mass, hemorrhage, midline shift, or extra-axial fluid collection apparent. Decreased attenuation is seen throughout mid pons region. Elsewhere gray-white compartments appear normal. Vascular: No hyperdense vessel. There is modest calcification in the cavernous carotid artery regions bilaterally. Skull: The bony calvarium appears intact. There is symmetric somewhat ground-glass type appearance along each inferior orbital floor. Sinuses/Orbits: There is mild mucosal thickening in several ethmoid air cells bilaterally. Other visualized paranasal sinuses are clear. Orbits appear symmetric bilaterally. Other: Mastoid air cells are clear. IMPRESSION: 1. Decreased attenuation is seen in the mid pons region. Elsewhere gray-white compartments appear normal. 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 This entity is usually seen following rapid correction of hyponatremia. It has been seen associated with chemotherapy on occasion, however. Given this appearance, correlation with brain MRI pre and post-contrast is advised. This area conceivably could represent edema from a focal metastasis in this area. 2. Elsewhere gray-white compartments appear normal. No mass is seen on noncontrast enhanced study. No hemorrhage. No extra-axial fluid. 3.  Slight calcification in each cavernous carotid artery. 4.  Mild mucosal thickening in each ethmoid air cell. 5. Essentially symmetric ground-glass type opacity  in each orbital floor region. The appearance of the bone in this area is atypical for metastatic disease. 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 Particular attention to these areas on MR advised. These results will be called to the ordering clinician or representative by the Radiologist Assistant, and communication documented in the PACS or zVision Dashboard. Electronically Signed   By: Lowella Grip III M.D.   On: 08/05/2016 16:05   Mr Jeri Cos TM Contrast  Result Date: 08/26/2016 CLINICAL DATA:  Breast cancer with headache. Abnormal recent CT scan. EXAM: MRI HEAD WITHOUT AND WITH CONTRAST TECHNIQUE: Multiplanar, multiecho pulse sequences of the brain and surrounding structures were obtained without and with intravenous contrast. CONTRAST:  41mL MULTIHANCE GADOBENATE DIMEGLUMINE 529 MG/ML IV SOLN COMPARISON:  None. FINDINGS: Brain: The midline structures are normal. There is no focal diffusion restriction to indicate acute infarct. There are 1-2 scattered foci of hyperintense T2 weighted signal in the frontal white matter, which may be seen in the setting of migraine headaches or early chronic microvascular disease; however, it is also seen in normal patients of this age. Parenchymal signal is otherwise normal. Specifically, there is no abnormality of the pons. No intraparenchymal hematoma or chronic microhemorrhage. Brain volume is normal for age without lobar predominant atrophy. The dura is normal and there is no extra-axial collection. Vascular: Major intracranial arterial and venous sinus flow voids are preserved. Skull and upper cervical spine: The visualized skull base, calvarium, upper cervical spine and extracranial soft tissues are normal. Sinuses/Orbits: No fluid levels or advanced mucosal thickening. No mastoid or middle ear effusion. Normal orbits. Specifically, no abnormality of the orbital floor. IMPRESSION: Normal MRI of the brain. Electronically Signed    By: Ulyses Jarred M.D.   On: 08/26/2016 01:04    Assessment: 46 y.o. [place] woman admitted with febrile neutropenia, currently day 13 cycle 3 of cytoxan/ adriamycin, day 12 neulasta  (0) status post left breast upper outer quadrant and left axillary lymph node biopsy 06/19/2016, both positive for a clinical T2-T3 N1, stage IIIB-C invasive ductal carcinoma, grade 3, triple negative, with an MIB-1 of 90%  (1) genetics testing  08/04/2016  (2) neoadjuvant chemotherapy to consist of doxorubicin and cyclophosphamide in dose dense fashion 4 followed by paclitaxel weekly 12 given with carboplatin starting 07/20/2016  (3) definitive surgery to follow  (4) adjuvant radiation to follow surgery    Plan:  Counts have recovered, she is afebrile, and all cultures remain negative; agree with d/c plans  She was on cipro when current events developed. I would be comfortable no resuming oral antibiotics at discharge but leave that to [facility] discretion  I have cancelled her 8/27 treatment and visit and moved it to 9/4. I have also dropped her chemo doses by 10%. She is aware.  Appreciate your excellent care of this patient!    Chauncey Cruel, MD 08/29/2016  10:23 AM Medical Oncology and Hematology [facility] [address] [place], [place] [address] Tel. [phone]    Fax. [phone]

## 2016-08-29 NOTE — Progress Notes (Signed)
On call provider notified regarding pt's expired telemetry order. Will continue to monitor closely. Carnella Guadalajara I

## 2016-08-30 LAB — BLOOD CULTURE ID PANEL (REFLEXED)

## 2016-08-31 ENCOUNTER — Ambulatory Visit: Payer: No Typology Code available for payment source

## 2016-08-31 ENCOUNTER — Other Ambulatory Visit: Payer: No Typology Code available for payment source

## 2016-08-31 ENCOUNTER — Other Ambulatory Visit: Payer: Medicaid Other

## 2016-08-31 ENCOUNTER — Ambulatory Visit: Payer: Medicaid Other | Admitting: Oncology

## 2016-08-31 LAB — CULTURE, BLOOD (ROUTINE X 2)
Culture: NO GROWTH
Special Requests: ADEQUATE

## 2016-09-02 LAB — CULTURE, BLOOD (ROUTINE X 2): Special Requests: ADEQUATE

## 2016-09-03 ENCOUNTER — Telehealth: Payer: Self-pay | Admitting: Oncology

## 2016-09-03 NOTE — Telephone Encounter (Signed)
sw pt to inform if infusion a[ppt 9/6 at 3 pm per Murvin Natal email

## 2016-09-04 ENCOUNTER — Telehealth: Payer: Self-pay | Admitting: Genetics

## 2016-09-04 NOTE — Telephone Encounter (Addendum)
Reviewed that germline genetic testing revealed no pathogenic mutations. This is typically considered to be a negative result. Testing was performed through [facility]'s 46-gene Common Hereditary Cancers Panel. [facility] Common Hereditary Cancers Panel includes analysis of the following 46 genes: APC, ATM, AXIN2, BARD1, BMPR1A, BRCA1, BRCA2, BRIP1, CDH1, CDKN2A, CHEK2, CTNNA1, DICER1, EPCAM, GREM1, HOXB13, KIT, MEN1, MLH1, MSH2, MSH3, MSH6, MUTYH, NBN, NF1, NTHL1, PALB2, PDGFRA, PMS2, POLD1, POLE, PTEN, RAD50, RAD51C, RAD51D, SDHA, SDHB, SDHC, SDHD, SMAD4, SMARCA4, STK11, TP53, TSC1, TSC2, and VHL.  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 A third genetic testing laboratory ([facility]) classifies this variant as a VUS. We reviewed why different labs may have different classifications of the same variant and acknowledged the complexity of making medical management decisions in the context of conflicting interpretations. Ms. Olvera was informed that [facility] An[facility] will test two of her family members at no expense as part of the family variant resolution program. Ms. Branca plans to have her mother and maternal aunt, [name], come in for a consultation and blood draw on Monday, September 10.  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 However, there are no known conflicting interpretations of this variant. Therefore, the presence of this VUS should not be used to make decisions regarding medical management.  I plan to meet with Ms. Dinges during her infusion appointment on Thursday, September 6 to discuss implications of her results and next steps in more detail. For more detailed discussion, please see genetic counseling documentation from 09/08/2016  once available. Result report dated 08/17/2016.

## 2016-09-08 ENCOUNTER — Encounter: Payer: Self-pay | Admitting: *Deleted

## 2016-09-08 ENCOUNTER — Ambulatory Visit (HOSPITAL_BASED_OUTPATIENT_CLINIC_OR_DEPARTMENT_OTHER): Payer: Medicaid Other | Admitting: Oncology

## 2016-09-08 ENCOUNTER — Other Ambulatory Visit (HOSPITAL_BASED_OUTPATIENT_CLINIC_OR_DEPARTMENT_OTHER): Payer: Medicaid Other

## 2016-09-08 ENCOUNTER — Other Ambulatory Visit: Payer: Medicaid Other

## 2016-09-08 VITALS — BP 124/73 | HR 86 | Temp 98.1°F | Resp 17 | Ht 67.5 in | Wt 256.4 lb

## 2016-09-08 DIAGNOSIS — C50412 Malignant neoplasm of upper-outer quadrant of left female breast: Secondary | ICD-10-CM

## 2016-09-08 DIAGNOSIS — Z171 Estrogen receptor negative status [ER-]: Principal | ICD-10-CM

## 2016-09-08 DIAGNOSIS — C773 Secondary and unspecified malignant neoplasm of axilla and upper limb lymph nodes: Secondary | ICD-10-CM | POA: Diagnosis not present

## 2016-09-08 LAB — CBC WITH DIFFERENTIAL/PLATELET
BASO%: 1.3 % (ref 0.0–2.0)
Basophils Absolute: 0.1 10*3/uL (ref 0.0–0.1)
EOS ABS: 0 10*3/uL (ref 0.0–0.5)
EOS%: 0.3 % (ref 0.0–7.0)
HEMATOCRIT: 34.6 % — AB (ref 34.8–46.6)
HEMOGLOBIN: 11.9 g/dL (ref 11.6–15.9)
LYMPH#: 0.9 10*3/uL (ref 0.9–3.3)
LYMPH%: 20.5 % (ref 14.0–49.7)
MCH: 29.8 pg (ref 25.1–34.0)
MCHC: 34.4 g/dL (ref 31.5–36.0)
MCV: 86.8 fL (ref 79.5–101.0)
MONO#: 0.5 10*3/uL (ref 0.1–0.9)
MONO%: 11.6 % (ref 0.0–14.0)
NEUT%: 66.3 % (ref 38.4–76.8)
NEUTROS ABS: 2.9 10*3/uL (ref 1.5–6.5)
PLATELETS: 271 10*3/uL (ref 145–400)
RBC: 3.98 10*6/uL (ref 3.70–5.45)
RDW: 19.3 % — AB (ref 11.2–14.5)
WBC: 4.3 10*3/uL (ref 3.9–10.3)

## 2016-09-08 LAB — COMPREHENSIVE METABOLIC PANEL
ALT: 22 U/L (ref 0–55)
AST: 22 U/L (ref 5–34)
Albumin: 3.8 g/dL (ref 3.5–5.0)
Alkaline Phosphatase: 81 U/L (ref 40–150)
Anion Gap: 13 mEq/L — ABNORMAL HIGH (ref 3–11)
BUN: 10.1 mg/dL (ref 7.0–26.0)
CO2: 29 meq/L (ref 22–29)
Calcium: 10 mg/dL (ref 8.4–10.4)
Chloride: 101 mEq/L (ref 98–109)
Creatinine: 0.8 mg/dL (ref 0.6–1.1)
EGFR: 89 mL/min/{1.73_m2} — AB (ref >90)
GLUCOSE: 112 mg/dL (ref 70–140)
POTASSIUM: 3.5 meq/L (ref 3.5–5.1)
SODIUM: 143 meq/L (ref 136–145)
Total Bilirubin: 0.71 mg/dL (ref 0.20–1.20)
Total Protein: 7.5 g/dL (ref 6.4–8.3)

## 2016-09-08 NOTE — Progress Notes (Signed)
[name]  [phone] [phone]     ID: [name] DOB: 1970/04/04  MR#: [id number]  [id number]#:[id number]  Patient Care Team: [name], MD as PCP - General (Internal Medicine) [name], MD as Consulting Physician (General Surgery) Maigan Bittinger, [name], MD as Consulting Physician (Oncology) [name], MD as Attending Physician (Radiation Oncology) Soijett A Blue OTHER MD:  CHIEF COMPLAINT: Triple negative breast cancer  CURRENT TREATMENT: Neoadjuvant chemotherapy  INTERVAL HISTORY: [name] returns today for follow-up and treatment of her triple negative breast cancer. After her first third cycle of cyclophosphamide and doxorubicin she had to be admitted with febrile neutropenia. This delayed her treatment by week. The treatment was further delayed by 3 days because of lack of availability in the cancer Center.  I have also dropped her dose by 10% for this final cyclophosphamide and doxorubicin cycle.   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 She notes that she has been recently taken off of her HCTZ and her blood pressure has improved. She states that she is awaiting conclusive results of her genetic testing and notes that her results were previously reviewed by two labs with inconclusive results. Pt reports that her genetic testing results will be sent to a 3rd lab where it will be tested again for conclusive results. Pt states that her mother and aunt will be tested through genetic testing this coming Monday, 09/14/2016. Pt reports that she would like to discuss possible mastectomy procedure that would be completed by Dr. Dalbert Batman due to her preliminary genetic testing results.  Pt reports that following her treatment on Thursday, 09/10/2016 she would like her treatments to be on Mondays. Pt LMP was in May 2018 with spotting noted in June 2018.    REVIEW OF SYSTEMS: Denies fever, mouth sores,  trouble swallowing, cough, or bowel issues. A detailed review of systems today was otherwise stable except as noted above   BREAST CANCER HISTORY: From the original intake note:  The patient was seen in the emergency room 05/16/2016 with nonspecific complaints but also noting that she had a tender lump in her lateral left breast which she said she had noted the day before. Exam by the emergency room physician confirmed a 3 cm soft left lateral breast mass without overlying erythema or nipple changes. This was felt to be most consistent with fibrocystic change but the patient was referred back to her primary care physician for further evaluation. She saw the physician assistant in May 18 and then Dr. Wynetta Sarah on 06/12/2016 who scheduled a bilateral diagnostic mammography with tomography and left breast ultrasonography at the [facility] 06/18/2016. This found the breast density to be category B. In the left breast upper outer quadrant there was an area of asymmetry measuring up to 10.3 cm. On exam this was firm and palpable and on ultrasound there was an irregular mass measuring 2.5 cm with some subtle changes in the surrounding tissue. In the left lower axilla there were 2 suspicious-looking lymph nodes.  Biopsy of the left breast upper outer quadrant and one of the suspicious lymph nodes 06/19/2016 showed ([id number]) both to be involved by invasive ductal carcinoma, grade 3, estrogen and progesterone receptor negative, HER-2 not amplified with a signals ratio being 1.53-1.74 and the number per cell 2.35-3.05. The MIB-1 was 90%.  The patient's subsequent history is as detailed below.   PAST MEDICAL HISTORY: Past Medical History:  Diagnosis Date  .  Malignant neoplasm of upper-outer quadrant of left breast in female, estrogen receptor negative ([facility]) 06/23/2016    PAST SURGICAL HISTORY: Past Surgical History:  Procedure Laterality Date  . ANKLE SURGERY    . IR FLUORO GUIDE PORT INSERTION RIGHT   07/14/2016  . IR US GUIDE VASC ACCESS RIGHT  07/14/2016  . TUBAL LIGATION      FAMILY HISTORY Family History  Problem Relation Age of Onset  . Diabetes Mother   . Hypertension Mother   . Arthritis Mother   . Colon cancer Father 66       d.76 metastatic at time of diagnosis  . Diabetes Father   . Heart disease Father   . Hypertension Father   . Breast cancer Maternal Grandmother 50       d.60s  . Breast cancer Maternal Aunt 52  . Cervical cancer Maternal Aunt 79  . Breast cancer Maternal Aunt 46       d.50s  . Cancer Maternal Uncle        d.62s unspecified type of cancer  . Breast cancer Cousin 35       paternal first-cousin (daughter of unaffected aunt)  The patient's father died at age 21 from colon cancer which she never had treated. The patient's mother is living, 64 years old as of June 2018. The patient has one brother, one sister. On the mother's side there is a history of breast cancer in the grandmother, age 101, and then 1 aunt with breast cancer age 32, and another with lung cancer age 104. There is no history of ovarian cancer in the family.  GYNECOLOGIC HISTORY:  No LMP recorded (lmp unknown). Menarche age 56, first live birth age 34. She is GX P4. Her periods have never been regular. She had a period in June 2018 but not for 4 months before that. She took birth control for some years remotely with no complications. She is status post bilateral tubal ligation.  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 Her husband Saman Giddens (goes by "[name]") is originally from [name]. The patient's children from her first marriage are [name] [name] 46 years old (all ages as of June 2018) recently in the [employer] but now taking courses sent rocking him immunity college, and [name], who is studying  psychology at [name]. Together with [name] the patient's children are [name] and hila, aged 14 and 58.     ADVANCED DIRECTIVES: Not in place   HEALTH MAINTENANCE: Social History  Substance Use Topics  . Smoking status: Never Smoker  . Smokeless tobacco: Never Used  . Alcohol use No     Colonoscopy:Never   NTI:RWER 2018  Bone density:Never   No Known Allergies  Current Outpatient Prescriptions  Medication Sig Dispense Refill  . acetaminophen (TYLENOL) 500 MG tablet Take 2 tablets (1,000 mg total) by mouth every 6 (six) hours as needed for mild pain. For fever/pain    . dexamethasone (DECADRON) 4 MG tablet Take 1 tablet (4 mg total) by mouth 2 (two) times daily. Take 2 tablets by mouth once a day on the day after chemotherapy and then take 2 tablets two times a day for 2 days. Take with food. 30 tablet 1  . docusate sodium (COLACE) 100 MG capsule Take 1 capsule (100 mg total) by  mouth 2 (two) times daily. (Patient taking differently: Take 200 mg by mouth 2 (two) times daily. ) 60 capsule 2  . lidocaine-prilocaine (EMLA) cream Apply to affected area once 30 g 3  . loratadine (CLARITIN) 10 MG tablet Take 1 tablet (10 mg total) by mouth daily. (Patient taking differently: Take 10 mg by mouth as directed. ) 90 tablet 1  . LORazepam (ATIVAN) 0.5 MG tablet Take 1 tablet (0.5 mg total) by mouth at bedtime as needed (Nausea or vomiting). 30 tablet 0  . prochlorperazine (COMPAZINE) 10 MG tablet Take 1 tablet (10 mg total) by mouth every 6 (six) hours as needed (Nausea or vomiting). 30 tablet 1  . traZODone (DESYREL) 50 MG tablet Take 0.5-1 tablets (25-50 mg total) by mouth at bedtime as needed for sleep. 30 tablet 3   No current facility-administered medications for this visit.     OBJECTIVE: Middle-aged white woman Who appears stated age  46:   09/08/16 1225  BP: 124/73  Pulse: 86  Resp: 17  Temp: 98.1 F (36.7 C)  SpO2: 100%     Body mass index is 39.57 kg/m.    Filed Weights   09/08/16 1225  Weight: 256 lb 6.4 oz (116.3 kg)       ECOG FS:1 - Symptomatic but completely ambulatory  Sclerae unicteric, pupils round and equal Oropharynx clear and moist No cervical or supraclavicular adenopathy Lungs no rales or rhonchi Heart regular rate and rhythm Abd soft, nontender, positive bowel sounds MSK no focal spinal tenderness, no upper extremity lymphedema Neuro: nonfocal, well oriented, appropriate affect Breasts: I do not palpate a mass in either breast. Both axillae are benign   Left breast on 08/11/2016     LAB RESULTS:  CMP     Component Value Date/Time   NA 143 09/08/2016 1148   K 3.5 09/08/2016 1148   CL 112 (H) 08/28/2016 0434   CO2 29 09/08/2016 1148   GLUCOSE 112 09/08/2016 1148   BUN 10.1 09/08/2016 1148   CREATININE 0.8 09/08/2016 1148   CALCIUM 10.0 09/08/2016 1148   PROT 7.5 09/08/2016 1148   ALBUMIN 3.8 09/08/2016 1148   AST 22 09/08/2016 1148   ALT 22 09/08/2016 1148   ALKPHOS 81 09/08/2016 1148   BILITOT 0.71 09/08/2016 1148   GFRNONAA >60 08/29/2016 0338   GFRAA >60 08/29/2016 0338    No results found for: TOTALPROTELP, ALBUMINELP, A1GS, A2GS, BETS, BETA2SER, GAMS, MSPIKE, SPEI  No results found for: Nils Pyle, [facility]  Lab Results  Component Value Date   WBC 4.3 09/08/2016   NEUTROABS 2.9 09/08/2016   HGB 11.9 09/08/2016   HCT 34.6 (L) 09/08/2016   MCV 86.8 09/08/2016   PLT 271 09/08/2016      Chemistry      Component Value Date/Time   NA 143 09/08/2016 1148   K 3.5 09/08/2016 1148   CL 112 (H) 08/28/2016 0434   CO2 29 09/08/2016 1148   BUN 10.1 09/08/2016 1148   CREATININE 0.8 09/08/2016 1148      Component Value Date/Time   CALCIUM 10.0 09/08/2016 1148   ALKPHOS 81 09/08/2016 1148   AST 22 09/08/2016 1148   ALT 22 09/08/2016 1148   BILITOT 0.71 09/08/2016 1148       No results found for: LABCA2  No components found for: [id number]  No results for input(s):  INR in the last 168 hours.  Urinalysis    Component Value Date/Time   COLORURINE YELLOW 08/26/2016 2203  APPEARANCEUR CLEAR 08/26/2016 2203   LABSPEC 1.006 08/26/2016 2203   PHURINE 6.0 08/26/2016 2203   GLUCOSEU NEGATIVE 08/26/2016 2203   HGBUR NEGATIVE 08/26/2016 2203   BILIRUBINUR NEGATIVE 08/26/2016 2203   KETONESUR NEGATIVE 08/26/2016 2203   PROTEINUR NEGATIVE 08/26/2016 2203   UROBILINOGEN 0.2 01/04/2010 1911   NITRITE NEGATIVE 08/26/2016 2203   LEUKOCYTESUR NEGATIVE 08/26/2016 2203     STUDIES: Dg Chest 2 View  Result Date: 08/26/2016 CLINICAL DATA:  Fever, active chemotherapy for breast cancer. EXAM: CHEST  2 VIEW COMPARISON:  Chest CT 07/25/2016 FINDINGS: Right chest port with tip in the mid SVC. The cardiomediastinal contours are normal. The lungs are clear. Pulmonary vasculature is normal. No consolidation, pleural effusion, or pneumothorax. No acute osseous abnormalities are seen. IMPRESSION: No acute abnormality.  Right chest port in place. Electronically Signed   By: Jeb Levering M.D.   On: 08/26/2016 21:43   Mr Jeri Cos NO Contrast  Result Date: 08/26/2016 CLINICAL DATA:  Breast cancer with headache. Abnormal recent CT scan. EXAM: MRI HEAD WITHOUT AND WITH CONTRAST TECHNIQUE: Multiplanar, multiecho pulse sequences of the brain and surrounding structures were obtained without and with intravenous contrast. CONTRAST:  64m MULTIHANCE GADOBENATE DIMEGLUMINE 529 MG/ML IV SOLN COMPARISON:  None. FINDINGS: Brain: The midline structures are normal. There is no focal diffusion restriction to indicate acute infarct. There are 1-2 scattered foci of hyperintense T2 weighted signal in the frontal white matter, which may be seen in the setting of migraine headaches or early chronic microvascular disease; however, it is also seen in normal patients of this age. Parenchymal signal is otherwise normal. Specifically, there is no abnormality of the pons. No intraparenchymal hematoma or  chronic microhemorrhage. Brain volume is normal for age without lobar predominant atrophy. The dura is normal and there is no extra-axial collection. Vascular: Major intracranial arterial and venous sinus flow voids are preserved. Skull and upper cervical spine: The visualized skull base, calvarium, upper cervical spine and extracranial soft tissues are normal. Sinuses/Orbits: No fluid levels or advanced mucosal thickening. No mastoid or middle ear effusion. Normal orbits. Specifically, no abnormality of the orbital floor. IMPRESSION: Normal MRI of the brain. Electronically Signed   By: KUlyses JarredM.D.   On: 08/26/2016 01:04    ELIGIBLE FOR AVAILABLE RESEARCH PROTOCOL: no   ASSESSMENT: 46y.o. 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 Additional testing of the patient's mother and maternal aunt is pending. Otherwise [facility]'s Common Hereditary Cancers Panel found no deleterious mutations in APC, ATM, AXIN2, BARD1, BMPR1A, BRCA1, BRCA2, BRIP1, CDH1, CDKN2A, CHEK2, CTNNA1, DICER1, EPCAM, GREM1, HOXB13, KIT, MEN1, MLH1, MSH2, MSH3, MSH6, MUTYH, NBN, NF1, NTHL1, PALB2, PDGFRA, PMS2, POLD1, POLE, PTEN, RAD50, RAD51C, RAD51D, SDHA, SDHB, SDHC, SDHD, SMAD4, SMARCA4, STK11, TP53, TSC1, TSC2, and VHL.  (2) neoadjuvant chemotherapy to consist of doxorubicin and cyclophosphamide in dose dense fashion 4 completed 09/10/2016 to be followed by paclitaxel weekly 12 given with carboplatin   (a) cycle 4 of cyclophosphamide and doxorubicin was delayed 10 days and dose decreased 10% because of febrile neutropenia after cycle  3  (3) definitive surgery to follow  (4) adjuvant radiation to  follow surgery  PLAN: [name] has recovered well from her febrile neutropenia episode and is ready to proceed to her final cycle of cyclophosphamide and doxorubicin. Once she completes that she will be ready to start paclitaxel and carboplatin, to be started 09/28/2016. We will see her that day to review how she is doing, Chek counts, and oriented her to the use of oral anti-emetics, which may not require steroids.  We reviewed her genetics testing, which raises a question regarding a deleterious mutation in BRCA2. Testing of family members is pending  At this point she is interested in the possibility of nipple sparing mastectomies with immediate expander reconstruction. She would also consider bilateral salpingo-oophorectomy.  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  She knows to call for any other problems that may develop before her next visit here.  [name], [name], MD  09/08/16 12:47 PM Medical Oncology and Hematology [facility] [address] [place], [place] [address] Tel. [phone]    Fax. [phone]  This document serves as a record of services personally performed by Lurline Del, MD. It was created on her behalf by Steva Colder, a trained medical scribe. The creation of this record is based on the scribe's personal observations and the provider's statements to them. This document has been checked and approved by the attending provider.

## 2016-09-10 ENCOUNTER — Ambulatory Visit (HOSPITAL_BASED_OUTPATIENT_CLINIC_OR_DEPARTMENT_OTHER): Payer: Medicaid Other

## 2016-09-10 VITALS — BP 127/76 | HR 97 | Temp 98.3°F | Resp 18

## 2016-09-10 DIAGNOSIS — C50412 Malignant neoplasm of upper-outer quadrant of left female breast: Secondary | ICD-10-CM

## 2016-09-10 DIAGNOSIS — Z171 Estrogen receptor negative status [ER-]: Principal | ICD-10-CM

## 2016-09-10 DIAGNOSIS — C773 Secondary and unspecified malignant neoplasm of axilla and upper limb lymph nodes: Secondary | ICD-10-CM

## 2016-09-10 DIAGNOSIS — Z5111 Encounter for antineoplastic chemotherapy: Secondary | ICD-10-CM

## 2016-09-10 MED ORDER — PALONOSETRON HCL INJECTION 0.25 MG/5ML
0.2500 mg | Freq: Once | INTRAVENOUS | Status: AC
  Administered 2016-09-10: 0.25 mg via INTRAVENOUS

## 2016-09-10 MED ORDER — SODIUM CHLORIDE 0.9 % IV SOLN
Freq: Once | INTRAVENOUS | Status: AC
  Administered 2016-09-10: 15:00:00 via INTRAVENOUS

## 2016-09-10 MED ORDER — PALONOSETRON HCL INJECTION 0.25 MG/5ML
INTRAVENOUS | Status: AC
  Filled 2016-09-10: qty 5

## 2016-09-10 MED ORDER — SODIUM CHLORIDE 0.9 % IV SOLN
Freq: Once | INTRAVENOUS | Status: AC
  Administered 2016-09-10: 15:00:00 via INTRAVENOUS
  Filled 2016-09-10: qty 5

## 2016-09-10 MED ORDER — SODIUM CHLORIDE 0.9% FLUSH
10.0000 mL | INTRAVENOUS | Status: DC | PRN
  Administered 2016-09-10: 10 mL
  Filled 2016-09-10: qty 10

## 2016-09-10 MED ORDER — PEGFILGRASTIM 6 MG/0.6ML ~~LOC~~ PSKT
6.0000 mg | PREFILLED_SYRINGE | Freq: Once | SUBCUTANEOUS | Status: AC
  Administered 2016-09-10: 6 mg via SUBCUTANEOUS
  Filled 2016-09-10: qty 0.6

## 2016-09-10 MED ORDER — DOXORUBICIN HCL CHEMO IV INJECTION 2 MG/ML
54.0000 mg/m2 | Freq: Once | INTRAVENOUS | Status: AC
  Administered 2016-09-10: 126 mg via INTRAVENOUS
  Filled 2016-09-10: qty 63

## 2016-09-10 MED ORDER — SODIUM CHLORIDE 0.9 % IV SOLN
540.0000 mg/m2 | Freq: Once | INTRAVENOUS | Status: AC
  Administered 2016-09-10: 1260 mg via INTRAVENOUS
  Filled 2016-09-10: qty 63

## 2016-09-10 MED ORDER — HEPARIN SOD (PORK) LOCK FLUSH 100 UNIT/ML IV SOLN
500.0000 [IU] | Freq: Once | INTRAVENOUS | Status: AC | PRN
Start: 2016-09-10 — End: 2016-09-10
  Administered 2016-09-10: 500 [IU]
  Filled 2016-09-10: qty 5

## 2016-09-10 NOTE — Patient Instructions (Signed)
[facility] Discharge Instructions for Patients Receiving Chemotherapy  Today you received the following chemotherapy agents Adriamycin/Cytoxan.  To help prevent nausea and vomiting after your treatment, we encourage you to take your nausea medication as prescribed.    If you develop nausea and vomiting that is not controlled by your nausea medication, call the clinic.   BELOW ARE SYMPTOMS THAT SHOULD BE REPORTED IMMEDIATELY:  *FEVER GREATER THAN 100.5 F  *CHILLS WITH OR WITHOUT FEVER  NAUSEA AND VOMITING THAT IS NOT CONTROLLED WITH YOUR NAUSEA MEDICATION  *UNUSUAL SHORTNESS OF BREATH  *UNUSUAL BRUISING OR BLEEDING  TENDERNESS IN MOUTH AND THROAT WITH OR WITHOUT PRESENCE OF ULCERS  *URINARY PROBLEMS  *BOWEL PROBLEMS  UNUSUAL RASH Items with * indicate a potential emergency and should be followed up as soon as possible.  Feel free to call the clinic you have any questions or concerns. The clinic phone number is [phone].  Please show the CHEMO ALERT CARD at check-in to the Emergency Department and triage nurse.   

## 2016-09-21 ENCOUNTER — Encounter: Payer: Self-pay | Admitting: Genetic Counselor

## 2016-09-21 DIAGNOSIS — Z1379 Encounter for other screening for genetic and chromosomal anomalies: Secondary | ICD-10-CM | POA: Insufficient documentation

## 2016-09-25 ENCOUNTER — Other Ambulatory Visit: Payer: Self-pay

## 2016-09-25 DIAGNOSIS — C50412 Malignant neoplasm of upper-outer quadrant of left female breast: Secondary | ICD-10-CM

## 2016-09-25 DIAGNOSIS — Z171 Estrogen receptor negative status [ER-]: Principal | ICD-10-CM

## 2016-09-28 ENCOUNTER — Other Ambulatory Visit: Payer: Self-pay | Admitting: Hematology and Oncology

## 2016-09-28 ENCOUNTER — Other Ambulatory Visit (HOSPITAL_BASED_OUTPATIENT_CLINIC_OR_DEPARTMENT_OTHER): Payer: Medicaid Other

## 2016-09-28 ENCOUNTER — Ambulatory Visit (HOSPITAL_BASED_OUTPATIENT_CLINIC_OR_DEPARTMENT_OTHER): Payer: Medicaid Other | Admitting: Adult Health

## 2016-09-28 ENCOUNTER — Ambulatory Visit (HOSPITAL_BASED_OUTPATIENT_CLINIC_OR_DEPARTMENT_OTHER): Payer: Medicaid Other

## 2016-09-28 ENCOUNTER — Other Ambulatory Visit (HOSPITAL_COMMUNITY)
Admission: RE | Admit: 2016-09-28 | Discharge: 2016-09-28 | Disposition: A | Discharge status: Home / Self Care | Payer: Medicaid Other | Source: Other Acute Inpatient Hospital | Attending: Adult Health | Admitting: Adult Health

## 2016-09-28 ENCOUNTER — Ambulatory Visit: Payer: Medicaid Other

## 2016-09-28 VITALS — BP 121/82 | HR 81 | Temp 98.2°F | Resp 18 | Ht 67.5 in | Wt 259.2 lb

## 2016-09-28 VITALS — BP 125/86 | HR 78 | Temp 99.2°F | Resp 16

## 2016-09-28 DIAGNOSIS — C50412 Malignant neoplasm of upper-outer quadrant of left female breast: Secondary | ICD-10-CM

## 2016-09-28 DIAGNOSIS — Z171 Estrogen receptor negative status [ER-]: Principal | ICD-10-CM

## 2016-09-28 DIAGNOSIS — R399 Unspecified symptoms and signs involving the genitourinary system: Secondary | ICD-10-CM | POA: Diagnosis present

## 2016-09-28 DIAGNOSIS — Z5111 Encounter for antineoplastic chemotherapy: Secondary | ICD-10-CM

## 2016-09-28 DIAGNOSIS — C773 Secondary and unspecified malignant neoplasm of axilla and upper limb lymph nodes: Secondary | ICD-10-CM | POA: Diagnosis not present

## 2016-09-28 LAB — CBC WITH DIFFERENTIAL/PLATELET
BASO%: 0.7 % (ref 0.0–2.0)
BASOS ABS: 0 10*3/uL (ref 0.0–0.1)
EOS%: 0.2 % (ref 0.0–7.0)
Eosinophils Absolute: 0 10*3/uL (ref 0.0–0.5)
HEMATOCRIT: 32 % — AB (ref 34.8–46.6)
HGB: 10.8 g/dL — ABNORMAL LOW (ref 11.6–15.9)
LYMPH%: 20.9 % (ref 14.0–49.7)
MCH: 30 pg (ref 25.1–34.0)
MCHC: 33.8 g/dL (ref 31.5–36.0)
MCV: 88.7 fL (ref 79.5–101.0)
MONO#: 0.5 10*3/uL (ref 0.1–0.9)
MONO%: 11.4 % (ref 0.0–14.0)
NEUT#: 3.2 10*3/uL (ref 1.5–6.5)
NEUT%: 66.8 % (ref 38.4–76.8)
Platelets: 226 10*3/uL (ref 145–400)
RBC: 3.61 10*6/uL — AB (ref 3.70–5.45)
RDW: 18.5 % — ABNORMAL HIGH (ref 11.2–14.5)
WBC: 4.8 10*3/uL (ref 3.9–10.3)
lymph#: 1 10*3/uL (ref 0.9–3.3)

## 2016-09-28 LAB — COMPREHENSIVE METABOLIC PANEL
ALBUMIN: 3.4 g/dL — AB (ref 3.5–5.0)
ALK PHOS: 84 U/L (ref 40–150)
ALT: 19 U/L (ref 0–55)
AST: 20 U/L (ref 5–34)
Anion Gap: 7 mEq/L (ref 3–11)
BILIRUBIN TOTAL: 0.57 mg/dL (ref 0.20–1.20)
BUN: 6.3 mg/dL — AB (ref 7.0–26.0)
CO2: 27 meq/L (ref 22–29)
Calcium: 9.3 mg/dL (ref 8.4–10.4)
Chloride: 104 mEq/L (ref 98–109)
Creatinine: 0.7 mg/dL (ref 0.6–1.1)
EGFR: >90 mL/min/{1.73_m2} (ref >90)
GLUCOSE: 104 mg/dL (ref 70–140)
POTASSIUM: 3.7 meq/L (ref 3.5–5.1)
SODIUM: 139 meq/L (ref 136–145)
TOTAL PROTEIN: 6.7 g/dL (ref 6.4–8.3)

## 2016-09-28 LAB — URINALYSIS, COMPLETE (UACMP) WITH MICROSCOPIC
Bilirubin Urine: NEGATIVE
GLUCOSE, UA: NEGATIVE mg/dL
HGB URINE DIPSTICK: NEGATIVE
Ketones, ur: NEGATIVE mg/dL
Leukocytes, UA: NEGATIVE
NITRITE: NEGATIVE
Protein, ur: NEGATIVE mg/dL
RBC / HPF: NONE SEEN RBC/hpf (ref 0–5)
SPECIFIC GRAVITY, URINE: 1.002 — AB (ref 1.005–1.030)
pH: 5 (ref 5.0–8.0)

## 2016-09-28 MED ORDER — FAMOTIDINE IN NACL 20-0.9 MG/50ML-% IV SOLN
20.0000 mg | Freq: Once | INTRAVENOUS | Status: AC
  Administered 2016-09-28: 20 mg via INTRAVENOUS

## 2016-09-28 MED ORDER — PALONOSETRON HCL INJECTION 0.25 MG/5ML
INTRAVENOUS | Status: AC
  Filled 2016-09-28: qty 5

## 2016-09-28 MED ORDER — DEXTROSE 5 % IV SOLN
80.0000 mg/m2 | Freq: Once | INTRAVENOUS | Status: AC
  Administered 2016-09-28: 186 mg via INTRAVENOUS
  Filled 2016-09-28: qty 31

## 2016-09-28 MED ORDER — DIPHENHYDRAMINE HCL 50 MG/ML IJ SOLN
50.0000 mg | Freq: Once | INTRAMUSCULAR | Status: AC
  Administered 2016-09-28: 50 mg via INTRAVENOUS

## 2016-09-28 MED ORDER — SODIUM CHLORIDE 0.9% FLUSH
10.0000 mL | Freq: Once | INTRAVENOUS | Status: AC
  Administered 2016-09-28: 10 mL
  Filled 2016-09-28: qty 10

## 2016-09-28 MED ORDER — LIDOCAINE-PRILOCAINE 2.5-2.5 % EX CREA: TOPICAL_CREAM | CUTANEOUS | 3 refills | Status: DC

## 2016-09-28 MED ORDER — SODIUM CHLORIDE 0.9 % IV SOLN
Freq: Once | INTRAVENOUS | Status: AC
  Administered 2016-09-28: 16:00:00 via INTRAVENOUS

## 2016-09-28 MED ORDER — SODIUM CHLORIDE 0.9 % IV SOLN
300.0000 mg | Freq: Once | INTRAVENOUS | Status: AC
  Administered 2016-09-28: 300 mg via INTRAVENOUS
  Filled 2016-09-28: qty 30

## 2016-09-28 MED ORDER — HEPARIN SOD (PORK) LOCK FLUSH 100 UNIT/ML IV SOLN
500.0000 [IU] | Freq: Once | INTRAVENOUS | Status: AC | PRN
  Administered 2016-09-28: 500 [IU]
  Filled 2016-09-28: qty 5

## 2016-09-28 MED ORDER — PROCHLORPERAZINE MALEATE 10 MG PO TABS: 10.0000 mg | ORAL_TABLET | Freq: Four times a day (QID) | ORAL | 1 refills | Status: DC | PRN

## 2016-09-28 MED ORDER — SODIUM CHLORIDE 0.9 % IV SOLN
20.0000 mg | Freq: Once | INTRAVENOUS | Status: AC
  Administered 2016-09-28: 20 mg via INTRAVENOUS
  Filled 2016-09-28: qty 2

## 2016-09-28 MED ORDER — SODIUM CHLORIDE 0.9% FLUSH
10.0000 mL | INTRAVENOUS | Status: DC | PRN
  Administered 2016-09-28: 10 mL
  Filled 2016-09-28: qty 10

## 2016-09-28 MED ORDER — FAMOTIDINE IN NACL 20-0.9 MG/50ML-% IV SOLN
INTRAVENOUS | Status: AC
  Filled 2016-09-28: qty 50

## 2016-09-28 MED ORDER — DIPHENHYDRAMINE HCL 50 MG/ML IJ SOLN
INTRAMUSCULAR | Status: AC
  Filled 2016-09-28: qty 1

## 2016-09-28 MED ORDER — PALONOSETRON HCL INJECTION 0.25 MG/5ML
0.2500 mg | Freq: Once | INTRAVENOUS | Status: AC
  Administered 2016-09-28: 0.25 mg via INTRAVENOUS

## 2016-09-28 MED FILL — PROCHLORPERAZINE 10 MG TAB: 10 | 8 days supply | Qty: 30 | Fill #0

## 2016-09-28 NOTE — Progress Notes (Signed)
[place]  [phone] [phone]     ID: [name] DOB: 02-17-1970  MR#: [id number]  KGM#:[id number]  Patient Care Team: [name], MD as PCP - General (Internal Medicine) [name], MD as Consulting Physician (General Surgery) Magrinat, [name], MD as Consulting Physician (Oncology) [name], MD as Attending Physician (Radiation Oncology) [name], NP OTHER MD:  CHIEF COMPLAINT: Triple negative breast cancer  CURRENT TREATMENT: Neoadjuvant chemotherapy  INTERVAL HISTORY: [name] returns today for follow-up and treatment of her triple negative breast cancer.  She is doing well today.  She is slightly anxious to start chemotherapy with Paclitaxel and Carboplatin.    REVIEW OF SYSTEMS: [name] has had a couple of weeks of mild low grade fevers in the absence of any other symptoms.  She says that she will feel hot when this happens and sometimes will check her temperature and it will be at the most 100 degrees.  She denies any cold symptoms such as cough, nasal drainage.  She denies any urinary symptoms, skin symptoms.  This has been going on since prior to receiving her final dose of Doxorubucin and Cyclophosphamide.  She denies any other issues today.     BREAST CANCER HISTORY: From the original intake note:  The patient was seen in the emergency room 05/16/2016 with nonspecific complaints but also noting that she had a tender lump in her lateral left breast which she said she had noted the day before. Exam by the emergency room physician confirmed a 3 cm soft left lateral breast mass without overlying erythema or nipple changes. This was felt to be most consistent with fibrocystic change but the patient was referred back to her primary care physician for further evaluation. She saw the physician assistant in May 18 and then Dr. Wynetta Emery on 06/12/2016 who scheduled a bilateral diagnostic mammography with tomography and left  breast ultrasonography at the [facility] 06/18/2016. This found the breast density to be category B. In the left breast upper outer quadrant there was an area of asymmetry measuring up to 10.3 cm. On exam this was firm and palpable and on ultrasound there was an irregular mass measuring 2.5 cm with some subtle changes in the surrounding tissue. In the left lower axilla there were 2 suspicious-looking lymph nodes.  Biopsy of the left breast upper outer quadrant and one of the suspicious lymph nodes 06/19/2016 showed ([id number]) both to be involved by invasive ductal carcinoma, grade 3, estrogen and progesterone receptor negative, HER-2 not amplified with a signals ratio being 1.53-1.74 and the number per cell 2.35-3.05. The MIB-1 was 90%.  The patient's subsequent history is as detailed below.   PAST MEDICAL HISTORY: Past Medical History:  Diagnosis Date  . Malignant neoplasm of upper-outer quadrant of left breast in female, estrogen receptor negative ([facility]) 06/23/2016    PAST SURGICAL HISTORY: Past Surgical History:  Procedure Laterality Date  . ANKLE SURGERY    . IR FLUORO GUIDE PORT INSERTION RIGHT  07/14/2016  . IR US GUIDE VASC ACCESS RIGHT  07/14/2016  . TUBAL LIGATION      FAMILY HISTORY Family History  Problem Relation Age of Onset  . Diabetes Mother   . Hypertension Mother   . Arthritis Mother   . Colon cancer Father 24       d.76 metastatic at time of diagnosis  . Diabetes Father   . Heart disease Father   . Hypertension Father   . Breast cancer Maternal Grandmother  56       d.60s  . Breast cancer Maternal Aunt 52  . Cervical cancer Maternal Aunt 38  . Breast cancer Maternal Aunt 46       d.50s  . Cancer Maternal Uncle        d.62s unspecified type of cancer  . Breast cancer Cousin 61       paternal first-cousin (daughter of unaffected aunt)  The patient's father died at age 56 from colon cancer which she never had treated. The patient's mother is living, 5 years  old as of June 2018. The patient has one brother, one sister. On the mother's side there is a history of breast cancer in the grandmother, age 76, and then 1 aunt with breast cancer age 32, and another with lung cancer age 22. There is no history of ovarian cancer in the family.  GYNECOLOGIC HISTORY:  No LMP recorded (lmp unknown). Menarche age 55, first live birth age 57. She is GX P4. Her periods have never been regular. She had a period in June 2018 but not for 4 months before that. She took birth control for some years remotely with no complications. She is status post bilateral tubal ligation.  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 Her husband Tremeka Helbling (goes by "[name]") is originally from [name]. The patient's children from her first marriage are [name] [name] 47 years old (all ages as of June 2018) recently in the [employer] but now taking courses sent rocking him immunity college, and [name], who is studying psychology at [name]. Together with [name] the patient's children are [name] and hila, aged 73 and 22.     ADVANCED DIRECTIVES: Not in place   HEALTH MAINTENANCE: Social History  Substance Use Topics  . Smoking status: Never Smoker  . Smokeless tobacco: Never Used  . Alcohol use No     Colonoscopy:Never   EYE:[facility] 2018  Bone density:Never   No Known Allergies  Current Outpatient Prescriptions  Medication Sig Dispense Refill  . acetaminophen (TYLENOL) 500 MG tablet Take 2 tablets (1,000 mg total) by mouth every 6 (six) hours as needed for mild pain. For fever/pain    . dexamethasone (DECADRON) 4 MG tablet Take 1 tablet (4 mg total) by mouth 2 (two) times daily. Take 2 tablets by mouth once a day on the day after chemotherapy and then take 2 tablets two times a day for 2  days. Take with food. 30 tablet 1  . docusate sodium (COLACE) 100 MG capsule Take 1 capsule (100 mg total) by mouth 2 (two) times daily. (Patient taking differently: Take 200 mg by mouth 2 (two) times daily. ) 60 capsule 2  . lidocaine-prilocaine (EMLA) cream Apply to affected area once 30 g 3  . lidocaine-prilocaine (EMLA) cream Apply to affected area once 30 g 3  . loratadine (CLARITIN) 10 MG tablet Take 1 tablet (10 mg total) by mouth daily. (Patient taking differently: Take 10 mg by mouth as directed. ) 90 tablet 1  . LORazepam (ATIVAN) 0.5 MG tablet Take 1 tablet (0.5 mg total) by mouth at bedtime as needed (Nausea or vomiting). 30 tablet 0  . prochlorperazine (COMPAZINE) 10 MG tablet Take 1 tablet (10 mg total) by mouth every 6 (six) hours as needed (Nausea or vomiting). 30 tablet  1  . traZODone (DESYREL) 50 MG tablet Take 0.5-1 tablets (25-50 mg total) by mouth at bedtime as needed for sleep. 30 tablet 3   No current facility-administered medications for this visit.     OBJECTIVE:   Vitals:   09/28/16 1500  BP: 121/82  Pulse: 81  Resp: 18  Temp: 98.2 F (36.8 C)  SpO2: 100%     Body mass index is 40 kg/m.   Filed Weights   09/28/16 1500  Weight: 259 lb 3.2 oz (117.6 kg)  GENERAL: Patient is a well appearing female in no acute distress HEENT:  Sclerae anicteric.  Oropharynx clear and moist. No ulcerations or evidence of oropharyngeal candidiasis. Neck is supple.  NODES:  No cervical, supraclavicular, or axillary lymphadenopathy palpated.  BREAST EXAM:  Deferred. LUNGS:  Clear to auscultation bilaterally.  No wheezes or rhonchi. HEART:  Regular rate and rhythm. No murmur appreciated. ABDOMEN:  Soft, nontender.  Positive, normoactive bowel sounds. No organomegaly palpated. MSK:  No focal spinal tenderness to palpation. Full range of motion bilaterally in the upper extremities. EXTREMITIES:  No peripheral edema.   SKIN:  Clear with no obvious rashes or skin changes. No nail  dyscrasia. NEURO:  Nonfocal. Well oriented.  Appropriate affect. ECOG FS:1 - Symptomatic but completely ambulatory    LAB RESULTS:  CMP     Component Value Date/Time   NA 139 09/28/2016 1428   K 3.7 09/28/2016 1428   CL 112 (H) 08/28/2016 0434   CO2 27 09/28/2016 1428   GLUCOSE 104 09/28/2016 1428   BUN 6.3 (L) 09/28/2016 1428   CREATININE 0.7 09/28/2016 1428   CALCIUM 9.3 09/28/2016 1428   PROT 6.7 09/28/2016 1428   ALBUMIN 3.4 (L) 09/28/2016 1428   AST 20 09/28/2016 1428   ALT 19 09/28/2016 1428   ALKPHOS 84 09/28/2016 1428   BILITOT 0.57 09/28/2016 1428   GFRNONAA >60 08/29/2016 0338   GFRAA >60 08/29/2016 0338    No results found for: TOTALPROTELP, ALBUMINELP, A1GS, A2GS, BETS, BETA2SER, GAMS, MSPIKE, SPEI  No results found for: KPAFRELGTCHN, LAMBDASER, KAPLAMBRATIO  Lab Results  Component Value Date   WBC 4.8 09/28/2016   NEUTROABS 3.2 09/28/2016   HGB 10.8 (L) 09/28/2016   HCT 32.0 (L) 09/28/2016   MCV 88.7 09/28/2016   PLT 226 09/28/2016      Chemistry      Component Value Date/Time   NA 139 09/28/2016 1428   K 3.7 09/28/2016 1428   CL 112 (H) 08/28/2016 0434   CO2 27 09/28/2016 1428   BUN 6.3 (L) 09/28/2016 1428   CREATININE 0.7 09/28/2016 1428      Component Value Date/Time   CALCIUM 9.3 09/28/2016 1428   ALKPHOS 84 09/28/2016 1428   AST 20 09/28/2016 1428   ALT 19 09/28/2016 1428   BILITOT 0.57 09/28/2016 1428       No results found for: LABCA2  No components found for: [id number]  No results for input(s): INR in the last 168 hours.  Urinalysis    Component Value Date/Time   COLORURINE STRAW (A) 09/28/2016 1515   APPEARANCEUR CLEAR 09/28/2016 1515   LABSPEC 1.002 (L) 09/28/2016 1515   PHURINE 5.0 09/28/2016 1515   GLUCOSEU NEGATIVE 09/28/2016 1515   [place] 09/28/2016 [place] 09/28/2016 1515   [place] 09/28/2016 1515   PROTEINUR NEGATIVE 09/28/2016 1515   UROBILINOGEN 0.2 01/04/2010 1911    NITRITE NEGATIVE 09/28/2016 1515   LEUKOCYTESUR NEGATIVE 09/28/2016 1515  STUDIES: No results found.  ELIGIBLE FOR AVAILABLE RESEARCH PROTOCOL: no   ASSESSMENT: 46 y.o. 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 Additional testing of the patient's mother and maternal aunt is pending. Otherwise [facility]'s Common Hereditary Cancers Panel found no deleterious mutations in APC, ATM, AXIN2, BARD1, BMPR1A, BRCA1, BRCA2, BRIP1, CDH1, CDKN2A, CHEK2, CTNNA1, DICER1, EPCAM, GREM1, HOXB13, KIT, MEN1, MLH1, MSH2, MSH3, MSH6, MUTYH, NBN, NF1, NTHL1, PALB2, PDGFRA, PMS2, POLD1, POLE, PTEN, RAD50, RAD51C, RAD51D, SDHA, SDHB, SDHC, SDHD, SMAD4, SMARCA4, STK11, TP53, TSC1, TSC2, and VHL.  (2) neoadjuvant chemotherapy to consist of doxorubicin and cyclophosphamide in dose dense fashion 4 completed 09/10/2016 to be followed by paclitaxel weekly 12 given with carboplatin   (a) cycle 4 of cyclophosphamide and doxorubicin was delayed 10 days and dose decreased 10% because of febrile neutropenia after cycle 3  (3) definitive surgery to follow  (4) adjuvant radiation to follow surgery  PLAN:  Rissa is doing well today.  Her labs today are normal.  I reviewed these with her in detail.  I reviewed the Paclitaxel and Carboplatin with her in detail.  I reviewed possible side effects including peripheral neuropathy.  She verbalizes understanding of her treatment.  She is positive about her treatment because her mass has gotten much smaller.  In regards to these low grade fevers, I cannot find an etiology.  For now she will  monitor, and if they increase, continue, or if she develops any accompanying symptoms, she will let us know.    Geanna will return in 1 week for labs, follow up, and her second week of chemotherapy.  She knows to call between now and then for any questions or concerns.    A total of (30) minutes of face-to-face time was spent with this patient with greater than 50% of that time in counseling and care-coordination.   Gardenia Phlegm, NP  09/29/16 8:24 AM Medical Oncology and Hematology [facility] [address] [place], [place] [address] Tel. [phone]    Fax. [phone]

## 2016-09-28 NOTE — Patient Instructions (Addendum)
[facility] Discharge Instructions for Patients Receiving Chemotherapy  Today you received the following chemotherapy agents: Taxol and Carboplatin To help prevent nausea and vomiting after your treatment, we encourage you to take your nausea medication as prescribed.   If you develop nausea and vomiting that is not controlled by your nausea medication, call the clinic.   BELOW ARE SYMPTOMS THAT SHOULD BE REPORTED IMMEDIATELY:  *FEVER GREATER THAN 100.5 F  *CHILLS WITH OR WITHOUT FEVER  NAUSEA AND VOMITING THAT IS NOT CONTROLLED WITH YOUR NAUSEA MEDICATION  *UNUSUAL SHORTNESS OF BREATH  *UNUSUAL BRUISING OR BLEEDING  TENDERNESS IN MOUTH AND THROAT WITH OR WITHOUT PRESENCE OF ULCERS  *URINARY PROBLEMS  *BOWEL PROBLEMS  UNUSUAL RASH Items with * indicate a potential emergency and should be followed up as soon as possible.  Feel free to call the clinic you have any questions or concerns. The clinic phone number is [phone].  Please show the [facility] at check-in to the Emergency Department and triage nurse.  Carboplatin injection What is this medicine? CARBOPLATIN (KAR boe pla tin) is a chemotherapy drug. It targets fast dividing cells, like cancer cells, and causes these cells to die. This medicine is used to treat ovarian cancer and many other cancers. This medicine may be used for other purposes; ask your health care provider or pharmacist if you have questions. COMMON BRAND NAME(S): Paraplatin What should I tell my health care provider before I take this medicine? They need to know if you have any of these conditions: -blood disorders -hearing problems -kidney disease -recent or ongoing radiation therapy -an unusual or allergic reaction to carboplatin, cisplatin, other chemotherapy, other medicines, foods, dyes, or preservatives -pregnant or trying to get pregnant -breast-feeding How should I use this medicine? This drug is usually given as  an infusion into a vein. It is administered in a hospital or clinic by a specially trained health care professional. Talk to your pediatrician regarding the use of this medicine in children. Special care may be needed. Overdosage: If you think you have taken too much of this medicine contact a poison control center or emergency room at once. NOTE: This medicine is only for you. Do not share this medicine with others. What if I miss a dose? It is important not to miss a dose. Call your doctor or health care professional if you are unable to keep an appointment. What may interact with this medicine? -medicines for seizures -medicines to increase blood counts like filgrastim, pegfilgrastim, sargramostim -some antibiotics like amikacin, gentamicin, neomycin, streptomycin, tobramycin -vaccines Talk to your doctor or health care professional before taking any of these medicines: -acetaminophen -aspirin -ibuprofen -ketoprofen -naproxen This list may not describe all possible interactions. Give your health care provider a list of all the medicines, herbs, non-prescription drugs, or dietary supplements you use. Also tell them if you smoke, drink alcohol, or use illegal drugs. Some items may interact with your medicine. What should I watch for while using this medicine? Your condition will be monitored carefully while you are receiving this medicine. You will need important blood work done while you are taking this medicine. This drug may make you feel generally unwell. This is not uncommon, as chemotherapy can affect healthy cells as well as cancer cells. Report any side effects. Continue your course of treatment even though you feel ill unless your doctor tells you to stop. In some cases, you may be given additional medicines to help with side effects. Follow all  directions for their use. Call your doctor or health care professional for advice if you get a fever, chills or sore throat, or other  symptoms of a cold or flu. Do not treat yourself. This drug decreases your body's ability to fight infections. Try to avoid being around people who are sick. This medicine may increase your risk to bruise or bleed. Call your doctor or health care professional if you notice any unusual bleeding. Be careful brushing and flossing your teeth or using a toothpick because you may get an infection or bleed more easily. If you have any dental work done, tell your dentist you are receiving this medicine. Avoid taking products that contain aspirin, acetaminophen, ibuprofen, naproxen, or ketoprofen unless instructed by your doctor. These medicines may hide a fever. Do not become pregnant while taking this medicine. Women should inform their doctor if they wish to become pregnant or think they might be pregnant. There is a potential for serious side effects to an unborn child. Talk to your health care professional or pharmacist for more information. Do not breast-feed an infant while taking this medicine. What side effects may I notice from receiving this medicine? Side effects that you should report to your doctor or health care professional as soon as possible: -allergic reactions like skin rash, itching or hives, swelling of the face, lips, or tongue -signs of infection - fever or chills, cough, sore throat, pain or difficulty passing urine -signs of decreased platelets or bleeding - bruising, pinpoint red spots on the skin, black, tarry stools, nosebleeds -signs of decreased red blood cells - unusually weak or tired, fainting spells, lightheadedness -breathing problems -changes in hearing -changes in vision -chest pain -high blood pressure -low blood counts - This drug may decrease the number of white blood cells, red blood cells and platelets. You may be at increased risk for infections and bleeding. -nausea and vomiting -pain, swelling, redness or irritation at the injection site -pain, tingling,  numbness in the hands or feet -problems with balance, talking, walking -trouble passing urine or change in the amount of urine Side effects that usually do not require medical attention (report to your doctor or health care professional if they continue or are bothersome): -hair loss -loss of appetite -metallic taste in the mouth or changes in taste This list may not describe all possible side effects. Call your doctor for medical advice about side effects. You may report side effects to FDA at 1-800-FDA-1088. Where should I keep my medicine? This drug is given in a hospital or clinic and will not be stored at home. NOTE: This sheet is a summary. It may not cover all possible information. If you have questions about this medicine, talk to your doctor, pharmacist, or health care provider.  2018 [employer]/Gold Standard (2007-03-29 14:38:05)    Paclitaxel injection What is this medicine? PACLITAXEL (PAK li TAX el) is a chemotherapy drug. It targets fast dividing cells, like cancer cells, and causes these cells to die. This medicine is used to treat ovarian cancer, breast cancer, and other cancers. This medicine may be used for other purposes; ask your health care provider or pharmacist if you have questions. COMMON BRAND NAME(S): Onxol, Taxol What should I tell my health care provider before I take this medicine? They need to know if you have any of these conditions: -blood disorders -irregular heartbeat -infection (especially a virus infection such as chickenpox, cold sores, or herpes) -liver disease -previous or ongoing radiation therapy -an unusual or allergic reaction  to paclitaxel, alcohol, polyoxyethylated castor oil, other chemotherapy agents, other medicines, foods, dyes, or preservatives -pregnant or trying to get pregnant -breast-feeding How should I use this medicine? This drug is given as an infusion into a vein. It is administered in a hospital or clinic by a specially  trained health care professional. Talk to your pediatrician regarding the use of this medicine in children. Special care may be needed. Overdosage: If you think you have taken too much of this medicine contact a poison control center or emergency room at once. NOTE: This medicine is only for you. Do not share this medicine with others. What if I miss a dose? It is important not to miss your dose. Call your doctor or health care professional if you are unable to keep an appointment. What may interact with this medicine? Do not take this medicine with any of the following medications: -disulfiram -metronidazole This medicine may also interact with the following medications: -cyclosporine -diazepam -ketoconazole -medicines to increase blood counts like filgrastim, pegfilgrastim, sargramostim -other chemotherapy drugs like cisplatin, doxorubicin, epirubicin, etoposide, teniposide, vincristine -quinidine -testosterone -vaccines -verapamil Talk to your doctor or health care professional before taking any of these medicines: -acetaminophen -aspirin -ibuprofen -ketoprofen -naproxen This list may not describe all possible interactions. Give your health care provider a list of all the medicines, herbs, non-prescription drugs, or dietary supplements you use. Also tell them if you smoke, drink alcohol, or use illegal drugs. Some items may interact with your medicine. What should I watch for while using this medicine? Your condition will be monitored carefully while you are receiving this medicine. You will need important blood work done while you are taking this medicine. This medicine can cause serious allergic reactions. To reduce your risk you will need to take other medicine(s) before treatment with this medicine. If you experience allergic reactions like skin rash, itching or hives, swelling of the face, lips, or tongue, tell your doctor or health care professional right away. In some cases,  you may be given additional medicines to help with side effects. Follow all directions for their use. This drug may make you feel generally unwell. This is not uncommon, as chemotherapy can affect healthy cells as well as cancer cells. Report any side effects. Continue your course of treatment even though you feel ill unless your doctor tells you to stop. Call your doctor or health care professional for advice if you get a fever, chills or sore throat, or other symptoms of a cold or flu. Do not treat yourself. This drug decreases your body's ability to fight infections. Try to avoid being around people who are sick. This medicine may increase your risk to bruise or bleed. Call your doctor or health care professional if you notice any unusual bleeding. Be careful brushing and flossing your teeth or using a toothpick because you may get an infection or bleed more easily. If you have any dental work done, tell your dentist you are receiving this medicine. Avoid taking products that contain aspirin, acetaminophen, ibuprofen, naproxen, or ketoprofen unless instructed by your doctor. These medicines may hide a fever. Do not become pregnant while taking this medicine. Women should inform their doctor if they wish to become pregnant or think they might be pregnant. There is a potential for serious side effects to an unborn child. Talk to your health care professional or pharmacist for more information. Do not breast-feed an infant while taking this medicine. Men are advised not to father a child while receiving  this medicine. This product may contain alcohol. Ask your pharmacist or healthcare provider if this medicine contains alcohol. Be sure to tell all healthcare providers you are taking this medicine. Certain medicines, like metronidazole and disulfiram, can cause an unpleasant reaction when taken with alcohol. The reaction includes flushing, headache, nausea, vomiting, sweating, and increased thirst. The  reaction can last from 30 minutes to several hours. What side effects may I notice from receiving this medicine? Side effects that you should report to your doctor or health care professional as soon as possible: -allergic reactions like skin rash, itching or hives, swelling of the face, lips, or tongue -low blood counts - This drug may decrease the number of white blood cells, red blood cells and platelets. You may be at increased risk for infections and bleeding. -signs of infection - fever or chills, cough, sore throat, pain or difficulty passing urine -signs of decreased platelets or bleeding - bruising, pinpoint red spots on the skin, black, tarry stools, nosebleeds -signs of decreased red blood cells - unusually weak or tired, fainting spells, lightheadedness -breathing problems -chest pain -high or low blood pressure -mouth sores -nausea and vomiting -pain, swelling, redness or irritation at the injection site -pain, tingling, numbness in the hands or feet -slow or irregular heartbeat -swelling of the ankle, feet, hands Side effects that usually do not require medical attention (report to your doctor or health care professional if they continue or are bothersome): -bone pain -complete hair loss including hair on your head, underarms, pubic hair, eyebrows, and eyelashes -changes in the color of fingernails -diarrhea -loosening of the fingernails -loss of appetite -muscle or joint pain -red flush to skin -sweating This list may not describe all possible side effects. Call your doctor for medical advice about side effects. You may report side effects to FDA at 1-800-FDA-1088. Where should I keep my medicine? This drug is given in a hospital or clinic and will not be stored at home. NOTE: This sheet is a summary. It may not cover all possible information. If you have questions about this medicine, talk to your doctor, pharmacist, or health care provider.  2018 [employer]  Standard (2014-10-23 19:58:00)

## 2016-09-29 ENCOUNTER — Telehealth: Payer: Self-pay

## 2016-09-29 ENCOUNTER — Encounter: Payer: Self-pay | Admitting: Adult Health

## 2016-09-29 NOTE — Telephone Encounter (Signed)
Chemo f/u call.  See flowsheet 

## 2016-10-05 ENCOUNTER — Other Ambulatory Visit (HOSPITAL_BASED_OUTPATIENT_CLINIC_OR_DEPARTMENT_OTHER): Payer: Medicaid Other

## 2016-10-05 ENCOUNTER — Encounter: Payer: Self-pay | Admitting: *Deleted

## 2016-10-05 ENCOUNTER — Ambulatory Visit: Payer: Medicaid Other

## 2016-10-05 ENCOUNTER — Ambulatory Visit (HOSPITAL_BASED_OUTPATIENT_CLINIC_OR_DEPARTMENT_OTHER): Payer: Medicaid Other | Admitting: Adult Health

## 2016-10-05 ENCOUNTER — Telehealth: Payer: Self-pay

## 2016-10-05 ENCOUNTER — Ambulatory Visit (HOSPITAL_BASED_OUTPATIENT_CLINIC_OR_DEPARTMENT_OTHER): Payer: Medicaid Other

## 2016-10-05 VITALS — BP 109/60 | HR 99 | Temp 98.6°F | Resp 18 | Ht 67.5 in | Wt 257.4 lb

## 2016-10-05 DIAGNOSIS — Z171 Estrogen receptor negative status [ER-]: Secondary | ICD-10-CM

## 2016-10-05 DIAGNOSIS — C773 Secondary and unspecified malignant neoplasm of axilla and upper limb lymph nodes: Secondary | ICD-10-CM

## 2016-10-05 DIAGNOSIS — C50412 Malignant neoplasm of upper-outer quadrant of left female breast: Secondary | ICD-10-CM

## 2016-10-05 DIAGNOSIS — Z5111 Encounter for antineoplastic chemotherapy: Secondary | ICD-10-CM

## 2016-10-05 LAB — CBC WITH DIFFERENTIAL/PLATELET
BASO%: 1.9 % (ref 0.0–2.0)
BASOS ABS: 0.1 10*3/uL (ref 0.0–0.1)
EOS%: 0.4 % (ref 0.0–7.0)
Eosinophils Absolute: 0 10*3/uL (ref 0.0–0.5)
HCT: 30.7 % — ABNORMAL LOW (ref 34.8–46.6)
HEMOGLOBIN: 10.6 g/dL — AB (ref 11.6–15.9)
LYMPH%: 20.5 % (ref 14.0–49.7)
MCH: 30.7 pg (ref 25.1–34.0)
MCHC: 34.4 g/dL (ref 31.5–36.0)
MCV: 89.2 fL (ref 79.5–101.0)
MONO#: 0.4 10*3/uL (ref 0.1–0.9)
MONO%: 8 % (ref 0.0–14.0)
NEUT#: 3.1 10*3/uL (ref 1.5–6.5)
NEUT%: 69.2 % (ref 38.4–76.8)
Platelets: 249 10*3/uL (ref 145–400)
RBC: 3.45 10*6/uL — ABNORMAL LOW (ref 3.70–5.45)
RDW: 17.7 % — AB (ref 11.2–14.5)
WBC: 4.5 10*3/uL (ref 3.9–10.3)
lymph#: 0.9 10*3/uL (ref 0.9–3.3)

## 2016-10-05 LAB — COMPREHENSIVE METABOLIC PANEL
ALBUMIN: 3.7 g/dL (ref 3.5–5.0)
ALK PHOS: 86 U/L (ref 40–150)
ALT: 35 U/L (ref 0–55)
ANION GAP: 11 meq/L (ref 3–11)
AST: 25 U/L (ref 5–34)
BILIRUBIN TOTAL: 0.68 mg/dL (ref 0.20–1.20)
BUN: 7.4 mg/dL (ref 7.0–26.0)
CALCIUM: 9.3 mg/dL (ref 8.4–10.4)
CO2: 24 meq/L (ref 22–29)
CREATININE: 0.7 mg/dL (ref 0.6–1.1)
Chloride: 102 mEq/L (ref 98–109)
EGFR: >90 mL/min/{1.73_m2} (ref >90)
Glucose: 118 mg/dl (ref 70–140)
Potassium: 3.6 mEq/L (ref 3.5–5.1)
Sodium: 137 mEq/L (ref 136–145)
TOTAL PROTEIN: 7.1 g/dL (ref 6.4–8.3)

## 2016-10-05 MED ORDER — LIDOCAINE-PRILOCAINE 2.5-2.5 % EX CREA: TOPICAL_CREAM | CUTANEOUS | 3 refills | Status: DC

## 2016-10-05 MED ORDER — VENLAFAXINE HCL ER 37.5 MG PO CP24: 37.5000 mg | ORAL_CAPSULE | Freq: Every day | ORAL | 0 refills | Status: DC

## 2016-10-05 MED ORDER — PALONOSETRON HCL INJECTION 0.25 MG/5ML
0.2500 mg | Freq: Once | INTRAVENOUS | Status: AC
  Administered 2016-10-05: 0.25 mg via INTRAVENOUS

## 2016-10-05 MED ORDER — SODIUM CHLORIDE 0.9 % IV SOLN
300.0000 mg | Freq: Once | INTRAVENOUS | Status: AC
  Administered 2016-10-05: 300 mg via INTRAVENOUS
  Filled 2016-10-05: qty 30

## 2016-10-05 MED ORDER — FAMOTIDINE IN NACL 20-0.9 MG/50ML-% IV SOLN
INTRAVENOUS | Status: AC
  Filled 2016-10-05: qty 50

## 2016-10-05 MED ORDER — PALONOSETRON HCL INJECTION 0.25 MG/5ML
INTRAVENOUS | Status: AC
  Filled 2016-10-05: qty 5

## 2016-10-05 MED ORDER — DEXAMETHASONE SODIUM PHOSPHATE 100 MG/10ML IJ SOLN
20.0000 mg | Freq: Once | INTRAMUSCULAR | Status: AC
  Administered 2016-10-05: 20 mg via INTRAVENOUS
  Filled 2016-10-05: qty 2

## 2016-10-05 MED ORDER — SODIUM CHLORIDE 0.9 % IV SOLN
Freq: Once | INTRAVENOUS | Status: AC
  Administered 2016-10-05: 16:00:00 via INTRAVENOUS

## 2016-10-05 MED ORDER — TRAZODONE HCL 50 MG PO TABS: 50.0000 mg | ORAL_TABLET | Freq: Every evening | ORAL | 0 refills | Status: DC | PRN

## 2016-10-05 MED ORDER — DIPHENHYDRAMINE HCL 50 MG/ML IJ SOLN
INTRAMUSCULAR | Status: AC
  Filled 2016-10-05: qty 1

## 2016-10-05 MED ORDER — SODIUM CHLORIDE 0.9% FLUSH
10.0000 mL | INTRAVENOUS | Status: DC | PRN
  Administered 2016-10-05: 10 mL
  Filled 2016-10-05: qty 10

## 2016-10-05 MED ORDER — FAMOTIDINE IN NACL 20-0.9 MG/50ML-% IV SOLN
20.0000 mg | Freq: Once | INTRAVENOUS | Status: AC
  Administered 2016-10-05: 20 mg via INTRAVENOUS

## 2016-10-05 MED ORDER — PACLITAXEL CHEMO INJECTION 300 MG/50ML
80.0000 mg/m2 | Freq: Once | INTRAVENOUS | Status: AC
  Administered 2016-10-05: 186 mg via INTRAVENOUS
  Filled 2016-10-05: qty 31

## 2016-10-05 MED ORDER — DIPHENHYDRAMINE HCL 50 MG/ML IJ SOLN
50.0000 mg | Freq: Once | INTRAMUSCULAR | Status: AC
  Administered 2016-10-05: 50 mg via INTRAVENOUS

## 2016-10-05 MED ORDER — SODIUM CHLORIDE 0.9% FLUSH
10.0000 mL | Freq: Once | INTRAVENOUS | Status: AC
  Administered 2016-10-05: 10 mL
  Filled 2016-10-05: qty 10

## 2016-10-05 MED ORDER — HEPARIN SOD (PORK) LOCK FLUSH 100 UNIT/ML IV SOLN
500.0000 [IU] | Freq: Once | INTRAVENOUS | Status: AC | PRN
  Administered 2016-10-05: 500 [IU]
  Filled 2016-10-05: qty 5

## 2016-10-05 MED FILL — LIDOCAINE-PRILOCAINE CREAM: 2.5-2.5 | 10 days supply | Qty: 30 | Fill #0

## 2016-10-05 MED FILL — VENLAFAXINE HCL ER 37.5 MG: 37.5 | 30 days supply | Qty: 30 | Fill #0

## 2016-10-05 MED FILL — traZODone HCL 50 MG TABS: 50 | 30 days supply | Qty: 60 | Fill #0

## 2016-10-05 NOTE — Telephone Encounter (Signed)
Printed avs and calender per 10/1 los..AP requested for patient to see her on 10/8@2 :30 (double book)

## 2016-10-05 NOTE — Progress Notes (Signed)
[facility]  [phone] [phone]     ID: [name] DOB: Feb 26, 1970  MR#: [id number]  HER#:[id number]  Patient Care Team: Sarah Pier, MD as PCP - General (Internal Medicine) Sarah Skates, MD as Consulting Physician (General Surgery) Khan, Sarah Dad, MD as Consulting Physician (Oncology) Sarah Gibson, MD as Attending Physician (Radiation Oncology) Sarah Dock, NP OTHER MD:  CHIEF COMPLAINT: Triple negative breast cancer  CURRENT TREATMENT: Neoadjuvant chemotherapy  INTERVAL HISTORY: [name] returns today for follow-up and treatment of her triple negative breast cancer.  She is here prior to receiving cycle 2 of Paclitaxel and Carboplatin.  She tolerated her first cycle relatively well.  She particularly denies peripheral neuropathy.    REVIEW OF SYSTEMS: [name] is doing well today.  She did have some mild aches following chemotherapy, but this has resolved.  She is increasingly tearful today due her genetic testing results and what that may mean for her surgery.  She was initially happy, due to her tumor shrinking to the point she can no longer feel it, and was hopeful that she could perhaps undergo lumpectomy.  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  She has not yet followed up with genetics to review the results in further detail.    [name]'s husband is very concerned about her.  She has experienced increased tearfulness, anhedonia, fatigue, increased sleeping.  This has been worse over the past few weeks, and is getting worse.  She denies any suicidal ideation.     BREAST CANCER HISTORY: From the original intake note:  The patient was seen in the emergency room 05/16/2016 with nonspecific complaints but also noting that she had a tender lump in her lateral  left breast which she said she had noted the day before. Exam by the emergency room physician confirmed a 3 cm soft left lateral breast mass without overlying erythema or nipple changes. This was felt to be most consistent with fibrocystic change but the patient was referred back to her primary care physician for further evaluation. She saw the physician assistant in May 18 and then Dr. Wynetta Khan on 06/12/2016 who scheduled a bilateral diagnostic mammography with tomography and left breast ultrasonography at the [facility] 06/18/2016. This found the breast density to be category B. In the left breast upper outer quadrant there was an area of asymmetry measuring up to 10.3 cm. On exam this was firm and palpable and on ultrasound there was an irregular mass measuring 2.5 cm with some subtle changes in the surrounding tissue. In the left lower axilla there were 2 suspicious-looking lymph nodes.  Biopsy of the left breast upper outer quadrant and one of the suspicious lymph nodes 06/19/2016 showed ([id number]) both to be involved by invasive ductal carcinoma, grade 3, estrogen and progesterone receptor negative, HER-2 not amplified with a signals ratio being 1.53-1.74 and the number per cell 2.35-3.05. The MIB-1 was 90%.  The patient's subsequent history is as detailed below.   PAST MEDICAL HISTORY: Past Medical History:  Diagnosis Date  . Malignant neoplasm of upper-outer quadrant of left breast in female, estrogen receptor negative ([facility]) 06/23/2016    PAST SURGICAL HISTORY: Past Surgical History:  Procedure Laterality Date  . ANKLE SURGERY    . IR FLUORO GUIDE PORT INSERTION RIGHT  07/14/2016  . IR US GUIDE VASC  ACCESS RIGHT  07/14/2016  . TUBAL LIGATION      FAMILY HISTORY Family History  Problem Relation Age of Onset  . Diabetes Mother   . Hypertension Mother   . Arthritis Mother   . Colon cancer Father 33       d.76 metastatic at time of diagnosis  . Diabetes Father   . Heart disease  Father   . Hypertension Father   . Breast cancer Maternal Grandmother 68       d.60s  . Breast cancer Maternal Aunt 52  . Cervical cancer Maternal Aunt 93  . Breast cancer Maternal Aunt 46       d.50s  . Cancer Maternal Uncle        d.62s unspecified type of cancer  . Breast cancer Cousin 24       paternal first-cousin (daughter of unaffected aunt)  The patient's father died at age 28 from colon cancer which she never had treated. The patient's mother is living, 53 years old as of June 2018. The patient has one brother, one sister. On the mother's side there is a history of breast cancer in the grandmother, age 28, and then 1 aunt with breast cancer age 75, and another with lung cancer age 67. There is no history of ovarian cancer in the family.  GYNECOLOGIC HISTORY:  No LMP recorded. Menarche age 45, first live birth age 4. She is GX P4. Her periods have never been regular. She had a period in June 2018 but not for 4 months before that. She took birth control for some years remotely with no complications. She is status post bilateral tubal ligation.  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 Her husband [name] (goes by "[name]") is originally from [name]. The patient's children from her first marriage are [name] "[name]" [name] 47 years old (all ages as of June 2018) recently in the [employer] but now taking courses sent rocking him immunity college, and [name], who is studying psychology at [name]. Together with [name] the patient's children are [name] and [name], aged 71 and 18.     ADVANCED DIRECTIVES: Not in place   HEALTH MAINTENANCE: Social History  Substance Use Topics  . Smoking status: Never Smoker  . Smokeless tobacco: Never Used  . Alcohol use No     Colonoscopy:Never    OXB:DZHG 2018  Bone density:Never   No Known Allergies  Current Outpatient Prescriptions  Medication Sig Dispense Refill  . docusate sodium (COLACE) 100 MG capsule Take 1 capsule (100 mg total) by mouth 2 (two) times daily. (Patient taking differently: Take 200 mg by mouth 2 (two) times daily. ) 60 capsule 2  . lidocaine-prilocaine (EMLA) cream Apply to affected area once 30 g 3  . lidocaine-prilocaine (EMLA) cream Apply to affected area once 30 g 3  . acetaminophen (TYLENOL) 500 MG tablet Take 2 tablets (1,000 mg total) by mouth every 6 (six) hours as needed for mild pain. For fever/pain     No current facility-administered medications for this visit.     OBJECTIVE:   Vitals:   10/05/16 1446  BP: 109/60  Pulse: 99  Resp: 18  Temp: 98.6 F (37 C)  SpO2: 99%     Body mass index is 39.72 kg/m.   Filed Weights  10/05/16 1446  Weight: 257 lb 6.4 oz (116.8 kg)  GENERAL: Patient is a well appearing female in no acute distress HEENT:  Sclerae anicteric.  Oropharynx clear and moist. No ulcerations or evidence of oropharyngeal candidiasis. Neck is supple.  NODES:  No cervical, supraclavicular, or axillary lymphadenopathy palpated.  BREAST EXAM:  Deferred. LUNGS:  Clear to auscultation bilaterally.  No wheezes or rhonchi. HEART:  Regular rate and rhythm. No murmur appreciated. ABDOMEN:  Soft, nontender.  Positive, normoactive bowel sounds. No organomegaly palpated. MSK:  No focal spinal tenderness to palpation. Full range of motion bilaterally in the upper extremities. EXTREMITIES:  No peripheral edema.   SKIN:  Clear with no obvious rashes or skin changes. No nail dyscrasia. NEURO:  Nonfocal. Well oriented. Tearful. ECOG FS:1 - Symptomatic but completely ambulatory    LAB RESULTS:  CMP     Component Value Date/Time   NA 139 09/28/2016 1428   K 3.7 09/28/2016 1428   CL 112 (H) 08/28/2016 0434   CO2 27 09/28/2016 1428   GLUCOSE 104 09/28/2016 1428   BUN 6.3 (L)  09/28/2016 1428   CREATININE 0.7 09/28/2016 1428   CALCIUM 9.3 09/28/2016 1428   PROT 6.7 09/28/2016 1428   ALBUMIN 3.4 (L) 09/28/2016 1428   AST 20 09/28/2016 1428   ALT 19 09/28/2016 1428   ALKPHOS 84 09/28/2016 1428   BILITOT 0.57 09/28/2016 1428   GFRNONAA >60 08/29/2016 0338   GFRAA >60 08/29/2016 0338    No results found for: TOTALPROTELP, ALBUMINELP, A1GS, A2GS, BETS, BETA2SER, GAMS, MSPIKE, SPEI  No results found for: KPAFRELGTCHN, LAMBDASER, [facility]  Lab Results  Component Value Date   WBC 4.5 10/05/2016   NEUTROABS 3.1 10/05/2016   HGB 10.6 (L) 10/05/2016   HCT 30.7 (L) 10/05/2016   MCV 89.2 10/05/2016   PLT 249 10/05/2016      Chemistry      Component Value Date/Time   NA 139 09/28/2016 1428   K 3.7 09/28/2016 1428   CL 112 (H) 08/28/2016 0434   CO2 27 09/28/2016 1428   BUN 6.3 (L) 09/28/2016 1428   CREATININE 0.7 09/28/2016 1428      Component Value Date/Time   CALCIUM 9.3 09/28/2016 1428   ALKPHOS 84 09/28/2016 1428   AST 20 09/28/2016 1428   ALT 19 09/28/2016 1428   BILITOT 0.57 09/28/2016 1428       No results found for: LABCA2  No components found for: [id number]  No results for input(s): INR in the last 168 hours.  Urinalysis    Component Value Date/Time   COLORURINE STRAW (A) 09/28/2016 1515   APPEARANCEUR CLEAR 09/28/2016 1515   LABSPEC 1.002 (L) 09/28/2016 1515   PHURINE 5.0 09/28/2016 1515   GLUCOSEU NEGATIVE 09/28/2016 1515   HGBUR NEGATIVE 09/28/2016 1515   BILIRUBINUR NEGATIVE 09/28/2016 1515   KETONESUR NEGATIVE 09/28/2016 1515   PROTEINUR NEGATIVE 09/28/2016 1515   UROBILINOGEN 0.2 01/04/2010 1911   NITRITE NEGATIVE 09/28/2016 1515   LEUKOCYTESUR NEGATIVE 09/28/2016 1515     STUDIES: No results found.  ELIGIBLE FOR AVAILABLE RESEARCH PROTOCOL: no   ASSESSMENT: 46 y.o. 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 This has been classified as likely pathogenic by [place] [facility]  but not other labs. Additional testing of the patient's mother and maternal aunt is pending. Otherwise [facility]'s Common Hereditary Cancers Panel found no deleterious mutations in APC, ATM, AXIN2, BARD1, BMPR1A, BRCA1, BRCA2, BRIP1, CDH1, CDKN2A, CHEK2, CTNNA1, DICER1, EPCAM, GREM1, HOXB13, KIT, MEN1, MLH1, MSH2, MSH3, MSH6, MUTYH, NBN, NF1, NTHL1, PALB2, PDGFRA, PMS2, POLD1, POLE, PTEN, RAD50, RAD51C, RAD51D, SDHA, SDHB, SDHC, SDHD, SMAD4, SMARCA4, STK11, TP53, TSC1, TSC2, and VHL.  (2) neoadjuvant chemotherapy to consist of doxorubicin and cyclophosphamide in dose dense fashion 4 completed 09/10/2016 to be followed by paclitaxel weekly 12 given with carboplatin   (a) cycle 4 of cyclophosphamide and doxorubicin was delayed 10 days and dose decreased 10% because of febrile neutropenia after cycle 3  (3) definitive surgery to follow  (4) adjuvant radiation to follow surgery  PLAN: [name] is doing moderately well today.  Her labs are stable and she will proceed with her second cycle of pacliataxel and carboplatin.  She and I reviewed that she is likely experiencing anxiety and depression.  I reviewed options for treatment including Effexor XR 37.5 mg daily.  This is a very low dose and will likely need to be increased soon.  I reviewed that with her, in addition to the risks/benefits of Effexor.  She would like to start taking it.  Due to this, I will see her back next week and make sure she is tolerating it well and see if we need to increase it and check in with how she is feeling.  I will also send a message to genetics to see if someone can sit down with her and discuss her results.    [name] will return in 1 week for labs,  follow up, and her third week of Paclitaxel and Carboplatin.  She knows to call between now and then for any questions or concerns.    A total of (30) minutes of face-to-face time was spent with this patient with greater than 50% of that time in counseling and care-coordination.   Sarah Phlegm, NP  10/05/16 2:52 PM Medical Oncology and Hematology [facility] [address] [place], [place] [address] Tel. [phone]    Fax. [phone]

## 2016-10-05 NOTE — Patient Instructions (Signed)
[facility] Discharge Instructions for Patients Receiving Chemotherapy  Today you received the following chemotherapy agents Taxol and Carboplatin.  To help prevent nausea and vomiting after your treatment, we encourage you to take your nausea medication as prescribed.   If you develop nausea and vomiting that is not controlled by your nausea medication, call the clinic.   BELOW ARE SYMPTOMS THAT SHOULD BE REPORTED IMMEDIATELY:  *FEVER GREATER THAN 100.5 F  *CHILLS WITH OR WITHOUT FEVER  NAUSEA AND VOMITING THAT IS NOT CONTROLLED WITH YOUR NAUSEA MEDICATION  *UNUSUAL SHORTNESS OF BREATH  *UNUSUAL BRUISING OR BLEEDING  TENDERNESS IN MOUTH AND THROAT WITH OR WITHOUT PRESENCE OF ULCERS  *URINARY PROBLEMS  *BOWEL PROBLEMS  UNUSUAL RASH Items with * indicate a potential emergency and should be followed up as soon as possible.  Feel free to call the clinic you have any questions or concerns. The clinic phone number is [phone].  Please show the CHEMO ALERT CARD at check-in to the Emergency Department and triage nurse.   

## 2016-10-05 NOTE — Patient Instructions (Signed)
Venlafaxine extended-release capsules  What is this medicine?  VENLAFAXINE(VEN la fax een) is used to treat depression, anxiety and panic disorder.  This medicine may be used for other purposes; ask your health care provider or pharmacist if you have questions.  COMMON BRAND NAME(S): Effexor XR  What should I tell my health care provider before I take this medicine?  They need to know if you have any of these conditions:  -bleeding disorders  -glaucoma  -heart disease  -high blood pressure  -high cholesterol  -kidney disease  -liver disease  -low levels of sodium in the blood  -mania or bipolar disorder  -seizures  -suicidal thoughts, plans, or attempt; a previous suicide attempt by you or a family  -take medicines that treat or prevent blood clots  -thyroid disease  -an unusual or allergic reaction to venlafaxine, desvenlafaxine, other medicines, foods, dyes, or preservatives  -pregnant or trying to get pregnant  -breast-feeding  How should I use this medicine?  Take this medicine by mouth with a full glass of water. Follow the directions on the prescription label. Do not cut, crush, or chew this medicine. Take it with food. If needed, the capsule may be carefully opened and the entire contents sprinkled on a spoonful of cool applesauce. Swallow the applesauce/pellet mixture right away without chewing and follow with a glass of water to ensure complete swallowing of the pellets. Try to take your medicine at about the same time each day. Do not take your medicine more often than directed. Do not stop taking this medicine suddenly except upon the advice of your doctor. Stopping this medicine too quickly may cause serious side effects or your condition may worsen.  A special MedGuide will be given to you by the pharmacist with each prescription and refill. Be sure to read this information carefully each time.  Talk to your pediatrician regarding the use of this medicine in children. Special care may be  needed.  Overdosage: If you think you have taken too much of this medicine contact a poison control center or emergency room at once.  NOTE: This medicine is only for you. Do not share this medicine with others.  What if I miss a dose?  If you miss a dose, take it as soon as you can. If it is almost time for your next dose, take only that dose. Do not take double or extra doses.  What may interact with this medicine?  Do not take this medicine with any of the following medications:  -certain medicines for fungal infections like fluconazole, itraconazole, ketoconazole, posaconazole, voriconazole  -cisapride  -desvenlafaxine  -dofetilide  -dronedarone  -duloxetine  -levomilnacipran  -linezolid  -MAOIs like Carbex, Eldepryl, Marplan, Nardil, and Parnate  -methylene blue (injected into a vein)  -milnacipran  -pimozide  -thioridazine  -ziprasidone  This medicine may also interact with the following medications:  -amphetamines  -aspirin and aspirin-like medicines  -certain medicines for depression, anxiety, or psychotic disturbances  -certain medicines for migraine headaches like almotriptan, eletriptan, frovatriptan, naratriptan, rizatriptan, sumatriptan, zolmitriptan  -certain medicines for sleep  -certain medicines that treat or prevent blood clots like dalteparin, enoxaparin, warfarin  -cimetidine  -clozapine  -diuretics  -fentanyl  -furazolidone  -indinavir  -isoniazid  -lithium  -metoprolol  -NSAIDS, medicines for pain and inflammation, like ibuprofen or naproxen  -other medicines that prolong the QT interval (cause an abnormal heart rhythm)  -procarbazine  -rasagiline  -supplements like St. John's wort, kava kava, valerian  -tramadol  -tryptophan    worse. Visit your doctor or health care professional for regular checks on your progress. Because it may take several weeks to see the full effects of this medicine, it is important to continue your treatment as prescribed by your doctor. Patients and their families should watch out for new or worsening thoughts of suicide or depression. Also watch out for sudden changes in feelings such as feeling anxious, agitated, panicky, irritable, hostile, aggressive, impulsive, severely restless, overly excited and hyperactive, or not being able to sleep. If this happens, especially at the beginning of treatment or after a change in dose, call your health care professional. This medicine can cause an increase in blood pressure. Check with your doctor for instructions on monitoring your blood pressure while taking this medicine. You may get drowsy or dizzy. Do not drive, use machinery, or do anything that needs mental alertness until you know how this medicine affects you. Do not stand or sit up quickly, especially if you are an older patient. This reduces the risk of dizzy or fainting spells. Alcohol may interfere with the effect of this medicine. Avoid alcoholic drinks. Your mouth may get dry. Chewing sugarless gum, sucking hard candy and drinking plenty of water will help. Contact your doctor if the problem does not go away or is severe. What side effects may I notice from receiving this medicine? Side effects that you should report to your doctor or health care professional as soon as possible: -allergic reactions like skin rash, itching or hives, swelling of the face, lips, or tongue -anxious -breathing problems -confusion -changes in vision -chest pain -confusion -elevated mood, decreased need for sleep, racing thoughts, impulsive behavior -eye pain -fast, irregular  heartbeat -feeling faint or lightheaded, falls -feeling agitated, angry, or irritable -hallucination, loss of contact with reality -high blood pressure -loss of balance or coordination -palpitations -redness, blistering, peeling or loosening of the skin, including inside the mouth -restlessness, pacing, inability to keep still -seizures -stiff muscles -suicidal thoughts or other mood changes -trouble passing urine or change in the amount of urine -trouble sleeping -unusual bleeding or bruising -unusually weak or tired -vomiting Side effects that usually do not require medical attention (report to your doctor or health care professional if they continue or are bothersome): -change in sex drive or performance -change in appetite or weight -constipation -dizziness -dry mouth -headache -increased sweating -nausea -tired This list may not describe all possible side effects. Call your doctor for medical advice about side effects. You may report side effects to FDA at 1-800-FDA-1088. Where should I keep my medicine? Keep out of the reach of children. Store at a controlled temperature between 20 and 25 degrees C (68 degrees and 77 degrees F), in a dry place. Throw away any unused medicine after the expiration date. NOTE: This sheet is a summary. It may not cover all possible information. If you have questions about this medicine, talk to your doctor, pharmacist, or health care provider.  2018 Elsevier/Gold Standard (2015-05-23 18:38:02)  

## 2016-10-06 ENCOUNTER — Ambulatory Visit: Payer: Self-pay | Admitting: Genetics

## 2016-10-06 ENCOUNTER — Encounter: Payer: Self-pay | Admitting: Adult Health

## 2016-10-06 DIAGNOSIS — Z1379 Encounter for other screening for genetic and chromosomal anomalies: Secondary | ICD-10-CM

## 2016-10-06 NOTE — Progress Notes (Signed)
HPI: Ms. Cortopassi was previously seen in the [place] clinic on 08/04/2016 due to a personal and family history of cancer and concerns regarding a hereditary predisposition to cancer. Please refer to our prior cancer genetics clinic note for more information regarding Ms. Eves's medical, social and family histories, and our assessment and recommendations, at the time. Ms. Platter recent genetic test results were disclosed to her, as were recommendations warranted by these results. These results and recommendations are discussed in more detail below.  CANCER HISTORY:  In June 2018, at the age of 46, Ms. Balow was diagnosed with triple negative invasive ductal carcinoma of the left breast. She is currently undergoing neoadjuvant chemotherapy.    Malignant neoplasm of upper-outer quadrant of left breast in female, estrogen receptor negative ([place])   06/23/2016 Initial Diagnosis    Malignant neoplasm of upper-outer quadrant of left breast in female, estrogen receptor negative ([place])     08/17/2016 Genetic Testing    BRCA2 c.8169T>A and MSH2 c.2050G>T VUS found on the common hereditary cancer panel.  The Hereditary Gene Panel offered by [facility] includes sequencing and/or deletion duplication testing of the following 46 genes: APC, ATM, AXIN2, BARD1, BMPR1A, BRCA1, BRCA2, BRIP1, CDH1, CDKN2A (p14ARF), CDKN2A (p16INK4a), CHEK2, CTNNA1, DICER1, EPCAM (Deletion/duplication testing only), GREM1 (promoter region deletion/duplication testing only), KIT, MEN1, MLH1, MSH2, MSH3, MSH6, MUTYH, NBN, NF1, NHTL1, PALB2, PDGFRA, PMS2, POLD1, POLE, PTEN, RAD50, RAD51C, RAD51D, SDHB, SDHC, SDHD, SMAD4, SMARCA4. STK11, TP53, TSC1, TSC2, and VHL.  The following genes were evaluated for sequence changes only: SDHA and HOXB13 c.251G>A variant only.  The report date is August 17, 2016.         FAMILY HISTORY:  We obtained a detailed, 4-generation family history.  Significant diagnoses are listed  below: Family History  Problem Relation Age of Onset  . Diabetes Mother   . Hypertension Mother   . Arthritis Mother   . Colon cancer Father 27       d.76 metastatic at time of diagnosis  . Diabetes Father   . Heart disease Father   . Hypertension Father   . Breast cancer Maternal Grandmother 32       d.60s  . Breast cancer Maternal Aunt 52  . Cervical cancer Maternal Aunt 61  . Breast cancer Maternal Aunt 46       d.50s  . Cancer Maternal Uncle        d.62s unspecified type of cancer  . Breast cancer Cousin 21       paternal first-cousin (daughter of unaffected aunt)   Ms. Schouten has three daughters, ages 63, 1, and 45, and one son, age 36. None have had cancers or tumors. Ms. Abernathy has a brother and a sister. They are twins to each other and are currently 7. Neither have had cancer, though she notes her sister has a long history of abnormal pap tests.  Ms. Garretson mother is 61 without cancers. Ms. Friedly had four maternal uncles and six maternal aunts who lived to adulthood. Two maternal aunts died as infants. All of her uncles are deceased. One had an unspecified form of cancer and died in his early-60s. All but one of her aunts is deceased. Her only living aunt, [name], had breast cancer in her early-50s and cervical cancer shortly after. [name] is currently 35. Another aunt, [name], was diagnosed with breast cancer at 18 and died in her 59s. The rest of the aunts did not have cancer. Ms. Philemon maternal grandmother was  diagnosed with breast cancer at 51 and died in her 19s. Ms. Romo maternal grandfather left the family when her mother was very young, so she has no further information about him.  Ms. Benito father recently died from colon cancer at age 33. She reports that his cancer was metastatic at the time of diagnosis and her father refused treatment. Ms. Blanchard only paternal uncle died at birth. Her only paternal aunt is 25 without cancers. This aunt's  daughter, June, was diagnosed with breast cancer around the age of 80 and is currently in her late-50s. Ms. Novicki paternal grandmother died at 40 without cancer. Her paternal grandfather died at 55 without cancer. This grandfather's mother had tongue cancer and died in her 20s.  Ms. Macquarrie is unaware of previous family history of genetic testing for hereditary cancer risks. Patient's maternal ancestors are of Caucasian and Native American descent, and paternal ancestors are of Caucasian descent. There is no reported Ashkenazi Jewish ancestry. There is no known consanguinity.  GENETIC TEST RESULTS: Genetic testing performed through [facility]'s Common Hereditary Cancer Panel reported out on 08/17/2016 showed no pathogenic mutations. This is typically considered to be a negative result. Testing was performed through [facility]'s 46-gene Common Hereditary Cancers Panel. [facility]'s Common Hereditary Cancers Panel includes analysis of the following 46 genes: APC, ATM, AXIN2, BARD1, BMPR1A, BRCA1, BRCA2, BRIP1, CDH1, CDKN2A, CHEK2, CTNNA1, DICER1, EPCAM, GREM1, HOXB13, KIT, MEN1, MLH1, MSH2, MSH3, MSH6, MUTYH, NBN, NF1, NTHL1, PALB2, PDGFRA, PMS2, POLD1, POLE, PTEN, RAD50, RAD51C, RAD51D, SDHA, SDHB, SDHC, SDHD, SMAD4, SMARCA4, STK11, TP53, TSC1, TSC2, and VHL.  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 Discussed that though VUSs should not typically change clinical management, another genetic testing laboratory [facility]) has classified this VUS as "likely pathogenic". A third genetic testing laboratory ([facility]) classifies this variant as a VUS. During our telephone results disclosure, we reviewed why different labs may have different classifications of the same variant and acknowledged the complexity of making medical management decisions in the context of conflicting interpretations. Ms. Lemonds was informed that [facility] will  test two of her family members at no expense as part of the family variant resolution program. At the time of our telephone conversation on 09/04/2016, Ms. Schrupp planed to have her mother and maternal aunt, [name], come in for a consultation and blood draw on Monday, September 10. However, this did not occur. Ms. Abruzzo mother and aunt [name] remain candidates for genetic testing.  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 However, there are no known conflicting interpretations of this variant. Therefore, the presence of this VUS should not be used to make decisions regarding medical management.  I planed to meet with Ms. Myrick during her infusion appointment on Thursday, September 6 to discuss implications of her results and next steps in more detail. However, we were not able to meet on September 6 and Ms. Delamora has an upcoming genetic counseling appointment on October 09, 2016. Result report is dated 08/17/2016.   Ultimately, at this time, it is unknown if this variant is associated with increased cancer risk or if this is a normal finding, but most variants such as this get reclassified to being inconsequential. We acknowledge that in rare cases, VUSs may be reclassified to pathogenic and may be actionable in the future. However, due to its current classification by the lab that tested Ms. Crespin, her BRCA2 VUS should not be used to make medical management  decisions. With time, we suspect the lab will determine the significance of this variant, if any. If we do learn more about it, we will try to contact Ms. Brogdon to discuss it further. However, it is important to stay in touch with [place] periodically and keep the address and phone number up to date.  The test report will be scanned into EPIC and will be located under the Molecular Pathology section of the Results Review tab.  We discussed with Ms. Mizrachi that since the current genetic testing is not perfect, it is  possible there may be a gene mutation in one of these genes that current testing cannot detect, but that chance is small. We also discussed, that it is possible that another gene that has not yet been discovered, or that we have not yet tested, is responsible for the cancer diagnoses in the family. Therefore, important to remain in touch with cancer genetics in the future so that we can continue to offer Ms. Fariss the most up to date genetic testing.   CANCER SCREENING RECOMMENDATIONS: Given Ms. Blatchford's personal and family histories, we must interpret these negative results with some caution.  Families with features suggestive of hereditary risk for cancer tend to have multiple family members with cancer, diagnoses in multiple generations and diagnoses before the age of 39. Ms. Resler family exhibits some of these features. Thus this result may simply reflect our current inability to detect all mutations within these genes or there may be a different gene that has not yet been discovered or tested. It is also possible that her BRCA2 VUS will ultimately be reclassified to causative/pathogenic. However, this is a rare occurrence and current guidelines do not support making medical management decisions based on the presence of a VUS. Ms. Pharris is recommended to continue the breast cancer treatment and screening plan as advised by her oncology team and primary care physician. Due to her father's history of colon cancer, NCCN guidelines indicate that Ms. Oliveria should undergo colonoscopy at least every 5 years.  RECOMMENDATIONS FOR FAMILY MEMBERS: Women in this family might be at some increased risk of developing breast cancer, over the general population risk, simply due to the family history of cancer. We recommended women in this family have a yearly mammogram beginning at age 74, or 18 years younger than the earliest onset of cancer, an annual clinical breast exam, and perform monthly breast  self-exams. Specifically, Ms. Cabacungan's daughters should begin annual breast imaging through mammograms and/or breast MRIs at age 58 (87 years prior to Ms. Preble's age at diagnosis). At that time, her daughters should consult their ordering physician regarding whether high-risk breast cancer screening protocol is indicated. Women in this family should also have a gynecological exam as recommended by their primary provider. All family members should have a colonoscopy by age 48.  Based on Ms. Mahn's family history, we recommended her maternal aunt [name], who was diagnosed with breast cancer at age 61, have genetic counseling and testing. Additional family members may be candidates for genetic testing and should consult their physicians and/or a genetic counselor if they would like to learn more about their hereditary cancer risks and genetic testing options. Furthermore, complimentary genetic testing for the BRCA2 VUS is available to two of Ms. Rosenbloom's family members. However, the purpose of testing family members for the BRCA2 VUS is intended to potentially determine the significance of this variant and may or may not affect clinical management of these family members. The [facility]  family variant testing program approval code is [id number] and should be referenced for testing her family members under this program.  Ms. Gehling will let us know if we can be of any assistance in coordinating genetic counseling and/or testing for her family members.   FOLLOW-UP: Lastly, cancer genetics is a rapidly advancing field and it is possible that new genetic tests will be appropriate for her and/or her family members in the future. We encouraged her to remain in contact with cancer genetics on an annual basis so we can update her personal and family histories and let her know of advances in cancer genetics that may benefit this family.     Mal Misty, MS, [facility] Certified Scientist, [email or web address]

## 2016-10-08 ENCOUNTER — Ambulatory Visit (HOSPITAL_BASED_OUTPATIENT_CLINIC_OR_DEPARTMENT_OTHER): Payer: Medicaid Other | Admitting: Genetic Counselor

## 2016-10-08 ENCOUNTER — Encounter: Payer: Self-pay | Admitting: *Deleted

## 2016-10-08 ENCOUNTER — Encounter: Payer: Self-pay | Admitting: Genetic Counselor

## 2016-10-08 DIAGNOSIS — C50412 Malignant neoplasm of upper-outer quadrant of left female breast: Secondary | ICD-10-CM | POA: Diagnosis not present

## 2016-10-08 DIAGNOSIS — Z7183 Encounter for nonprocreative genetic counseling: Secondary | ICD-10-CM

## 2016-10-08 DIAGNOSIS — Z171 Estrogen receptor negative status [ER-]: Secondary | ICD-10-CM

## 2016-10-08 DIAGNOSIS — Z803 Family history of malignant neoplasm of breast: Secondary | ICD-10-CM | POA: Diagnosis not present

## 2016-10-08 DIAGNOSIS — Z1379 Encounter for other screening for genetic and chromosomal anomalies: Secondary | ICD-10-CM

## 2016-10-08 DIAGNOSIS — Z800 Family history of malignant neoplasm of digestive organs: Secondary | ICD-10-CM

## 2016-10-08 NOTE — Progress Notes (Addendum)
HPI: [name] was previously seen in the [place] clinic due to a personal and family history of cancer and concerns regarding a hereditary predisposition to cancer. Please refer to our prior cancer genetics clinic note for more information regarding [name]'s medical, social and family histories, and our assessment and recommendations, at the time. [name] recent genetic test results were disclosed to her, as were recommendations warranted by these results. These results and recommendations are discussed in more detail below.  CANCER HISTORY:    Malignant neoplasm of upper-outer quadrant of left breast in female, estrogen receptor negative ([place])   06/23/2016 Initial Diagnosis    Malignant neoplasm of upper-outer quadrant of left breast in female, estrogen receptor negative ([place])     08/17/2016 Genetic Testing    BRCA2 c.8169T>A and MSH2 c.2050G>T VUS found on the common hereditary cancer panel.  The Hereditary Gene Panel offered by [facility] includes sequencing and/or deletion duplication testing of the following 46 genes: APC, ATM, AXIN2, BARD1, BMPR1A, BRCA1, BRCA2, BRIP1, CDH1, CDKN2A (p14ARF), CDKN2A (p16INK4a), CHEK2, CTNNA1, DICER1, EPCAM (Deletion/duplication testing only), GREM1 (promoter region deletion/duplication testing only), KIT, MEN1, MLH1, MSH2, MSH3, MSH6, MUTYH, NBN, NF1, NHTL1, PALB2, PDGFRA, PMS2, POLD1, POLE, PTEN, RAD50, RAD51C, RAD51D, SDHB, SDHC, SDHD, SMAD4, SMARCA4. STK11, TP53, TSC1, TSC2, and VHL.  The following genes were evaluated for sequence changes only: SDHA and HOXB13 c.251G>A variant only.  The report date is August 17, 2016.        FAMILY HISTORY:  We obtained a detailed, 4-generation family history.  Significant diagnoses are listed below: Family History  Problem Relation Age of Onset  . Diabetes Mother   . Hypertension Mother   . Arthritis Mother   . Colon cancer Father 61       d.76 metastatic at time of diagnosis  .  Diabetes Father   . Heart disease Father   . Hypertension Father   . Breast cancer Maternal Grandmother 37       d.60s  . Breast cancer Maternal Aunt 52  . Cervical cancer Maternal Aunt 53  . Breast cancer Maternal Aunt 46       d.50s  . Cancer Maternal Uncle        d.62s unspecified type of cancer  . Breast cancer Cousin 80       paternal first-cousin (daughter of unaffected aunt)  . Cancer Maternal Aunt 52       "Female Cancer"  . Cancer Maternal Aunt        unknown cancer   This information is updated with information about the patient's mothers family history:  [name] has three daughters, ages 32, 70, and 10, and one son, age 81. None have had cancers or tumors. [name] has a brother and a sister. They are twins to each other and are currently 42. Neither have had cancer, though she notes her sister has a long history of abnormal pap tests.  [name] mother is 6 without cancers. [name] had four maternal uncles and six maternal aunts who lived to adulthood. Two maternal aunts died as infants. All of her uncles are deceased. One had an unspecified form of cancer and died in his early-60's. All but one of her aunts is deceased. Her only living aunt, [name], had breast cancer in her early-50's and cervical cancer shortly after. [name] is currently 93. Another aunt, [name], was diagnosed with breast cancer at 2 and died in her 46's, an aunt, [name] died at  age 76 from a "female cancer", and a fourth aunt had an unknown cancer.  One aunt did not have cancer. [name] maternal grandmother was diagnosed with breast cancer at 1 and died in her 68's. Ms. Ging maternal grandfather left the family when her mother was very young, so she has no further information about him.  [name] father recently died from colon cancer at age 4. She reports that his cancer was metastatic at the time of diagnosis and her father refused treatment. [name] only  paternal uncle died at birth. Her only paternal aunt is 29 without cancers. This aunt's daughter, [name], was diagnosed with breast cancer around the age of 65 and is currently in her late-50's. [name] paternal grandmother died at 16 without cancer. Her paternal grandfather died at 75 without cancer. This grandfather's mother had tongue cancer and died in her 62's.  [name] is unaware of previous family history of genetic testing for hereditary cancer risks. Patient's maternal ancestors are of Caucasian and Native American descent, and paternal ancestors are of Caucasian descent. There is no reported Ashkenazi Jewish ancestry. There is no known consanguinity.  GENETIC TEST RESULTS: Genetic testing reported out on August 17, 2016 through the Common Hereditary cancer panel cancer panel found no deleterious mutations.  The Hereditary Gene Panel offered by [facility] includes sequencing and/or deletion duplication testing of the following 46 genes: APC, ATM, AXIN2, BARD1, BMPR1A, BRCA1, BRCA2, BRIP1, CDH1, CDKN2A (p14ARF), CDKN2A (p16INK4a), CHEK2, CTNNA1, DICER1, EPCAM (Deletion/duplication testing only), GREM1 (promoter region deletion/duplication testing only), KIT, MEN1, MLH1, MSH2, MSH3, MSH6, MUTYH, NBN, NF1, NHTL1, PALB2, PDGFRA, PMS2, POLD1, POLE, PTEN, RAD50, RAD51C, RAD51D, SDHB, SDHC, SDHD, SMAD4, SMARCA4. STK11, TP53, TSC1, TSC2, and VHL.  The following genes were evaluated for sequence changes only: SDHA and HOXB13 c.251G>A variant only.  The test report has been scanned into EPIC and is located under the Molecular Pathology section of the Results Review tab.   We discussed with [name] that since the current genetic testing is not perfect, it is possible there may be a gene mutation in one of these genes that current testing cannot detect, but that chance is small. We also discussed, that it is possible that another gene that has not yet been discovered, or that we have not yet  tested, is responsible for the cancer diagnoses in the family, and it is, therefore, important to remain in touch with cancer genetics in the future so that we can continue to offer [name] the most up to date genetic testing.   Genetic testing did detect two Variants of Unknown Significance one in the MSH2 gene called c.2050G>T and a second in BRCA2 called c.8169T>A. At this time, it is unknown if these variants are associated with increased cancer risk or if this is a normal finding, but most variants such as this get reclassified to being inconsequential. In some circumstances, a variant could be "upgraded" and be called pathogenic. Based off the interpretation from this laboratory, both the MSH2 and BRCA2 VUS should not be used to make medical management decisions. With time, we suspect the lab will determine the significance of these variant, if any. If we do learn more about it, we will try to contact [name] to discuss it further. However, it is important to stay in touch with [place] periodically and keep the address and phone number up to date.   ADDITIONAL GENETIC TESTING: [facility] genetics will allow the patient to choose two family members  to undergo genetic testing for free under their variant testing program.  The patient's mother is here today with the patient and will undergo genetic testing through [facility]'s variant testing program.  The patient's maternal aunt, [name], who had breast cancer in her 60's, is not here today.  We discussed that we can test her mother today, and if her mother is positive we can then offer testing to the aunt.  If her mother is negative, we could then focus on her father's side of the family.  We also discussed with [name] that another laboratory calls this same BRCA2 variant as Likely Pathogenic.  This could mean that the BRCA2 VUS will be reclassified in the future as likely pathogenic by [facility]. When two laboratories have conflicting interpretations, it  causes difficulty in how to proceed. We discussed that her breast cancer diagnosis would likely allow her to have a mastectomy, but having a VUS would not qualify her to have her ovaries out.  In order to provide the patient with the most preventive management, we should consider testing her through [facility].  By testing through [facility], she would have a test that indicates her BRCA variant as likely pathogenic and we could proceed with management based on a positive result.  We disucssed that there are two ways to do this:  1. We could do single site testing on [name] through [facility].  Her insurance may not want to pay for this since they have already paid for testing through [facility].  At that time it would be self pay for approximately $200.  2. If [name] mother tests positive, we can offer testing to her aunt [name] through both [facility] and [facility].  [facility]'s test would be through their variant testing program and therefore there would not be a cost.  We could also go through her insurance to cover a panel test through [facility].  If she is positive, [facility] will allow family members to undergo genetic testing for free, so Ms. Millay could get single site testing for free.  RECOMMENDATIONS FOR FAMILY MEMBERS: Women in this family might be at some increased risk of developing cancer, over the general population risk, simply due to the family history of cancer. We recommended women in this family have a yearly mammogram beginning at age 26, or 44 years younger than the earliest onset of cancer, an annual clinical breast exam, and perform monthly breast self-exams. Women in this family should also have a gynecological exam as recommended by their primary provider. All family members should have a colonoscopy by age 7.  Based on Ms. Dejoy's family history, we recommended her maternal aunt [name], who was diagnosed with breast cancer at age 45, and her paternal first  cousin, [name], who was diagnosed with breast cancer at age 53, have genetic counseling and testing. Ms. Hanover will let us know if we can be of any assistance in coordinating genetic counseling and/or testing for her cousin [name], and she will have her aunt [name] come in for testing in the near future.   FOLLOW-UP: Ms. Lampron mother underwent genetic testing today.  If she is positive, Ms. Oregel aunt will come in for testing through both the variant testing program at [facility] as well as [facility] clinical testing.  This will hopefully be informative for many family members, as well as Ms. Shorty.  Lastly, we discussed with Ms. Mclamb that cancer genetics is a rapidly advancing field and it is possible that new  genetic tests will be appropriate for her and/or her family members in the future. We encouraged her to remain in contact with cancer genetics on an annual basis so we can update her personal and family histories and let her know of advances in cancer genetics that may benefit this family.   Our contact number was provided. Ms. Parco questions were answered to her satisfaction, and she knows she is welcome to call us at anytime with additional questions or concerns.   Roma Kayser, MS, [facility] Certified Genetic Counselor [email or web address]

## 2016-10-09 ENCOUNTER — Encounter: Payer: Medicaid Other | Admitting: Genetic Counselor

## 2016-10-11 NOTE — Progress Notes (Addendum)
[place]  [phone] [phone]     ID: [name] DOB: [birth year]  MR#: [id number]  RFF#:[id number]  Patient Care Team: [name], MD as PCP - General (Internal Medicine) [name], MD as Consulting Physician (General Surgery) Magrinat, [name], MD as Consulting Physician (Oncology) [name], MD as Attending Physician (Radiation Oncology) [name], NP OTHER MD:  CHIEF COMPLAINT: Triple negative breast cancer  CURRENT TREATMENT: Neoadjuvant chemotherapy  INTERVAL HISTORY: [name] returns today for follow-up and treatment of her triple negative breast cancer.  She is here prior to receiving cycle 3 of Paclitaxel and Carboplatin.  She tolerated her first cycle relatively well.  She particularly denies peripheral neuropathy.    REVIEW OF SYSTEMS: [name] was feeling poorly last week and was depressed and tearful.  I started her on Effexor.  I had also suggested she increase her trazodone to 2 per night instead of 1 to help her sleep.  She did sleep so much better and was feeling so much better she never started Effexor.  She denies any peripheral neuropathy. Otherwise today, a detailed ROS is non contributory.    BREAST CANCER HISTORY: From the original intake note:  The patient was seen in the emergency room 05/16/2016 with nonspecific complaints but also noting that she had a tender lump in her lateral left breast which she said she had noted the day before. Exam by the emergency room physician confirmed a 3 cm soft left lateral breast mass without overlying erythema or nipple changes. This was felt to be most consistent with fibrocystic change but the patient was referred back to her primary care physician for further evaluation. She saw the physician assistant in May 18 and then Dr. Wynetta Emery on 06/12/2016 who scheduled a bilateral diagnostic mammography with tomography and left breast ultrasonography at the [facility]  06/18/2016. This found the breast density to be category B. In the left breast upper outer quadrant there was an area of asymmetry measuring up to 10.3 cm. On exam this was firm and palpable and on ultrasound there was an irregular mass measuring 2.5 cm with some subtle changes in the surrounding tissue. In the left lower axilla there were 2 suspicious-looking lymph nodes.  Biopsy of the left breast upper outer quadrant and one of the suspicious lymph nodes 06/19/2016 showed ([id number]) both to be involved by invasive ductal carcinoma, grade 3, estrogen and progesterone receptor negative, HER-2 not amplified with a signals ratio being 1.53-1.74 and the number per cell 2.35-3.05. The MIB-1 was 90%.  The patient's subsequent history is as detailed below.   PAST MEDICAL HISTORY: Past Medical History:  Diagnosis Date  . Malignant neoplasm of upper-outer quadrant of left breast in female, estrogen receptor negative ([place]) 06/23/2016    PAST SURGICAL HISTORY: Past Surgical History:  Procedure Laterality Date  . ANKLE SURGERY    . IR FLUORO GUIDE PORT INSERTION RIGHT  07/14/2016  . IR US GUIDE VASC ACCESS RIGHT  07/14/2016  . TUBAL LIGATION      FAMILY HISTORY Family History  Problem Relation Age of Onset  . Diabetes Mother   . Hypertension Mother   . Arthritis Mother   . Colon cancer Father 8       d.76 metastatic at time of diagnosis  . Diabetes Father   . Heart disease Father   . Hypertension Father   . Breast cancer Maternal Grandmother 58       d.60s  . Breast cancer  Maternal Aunt 52  . Cervical cancer Maternal Aunt 79  . Breast cancer Maternal Aunt 46       d.50s  . Cancer Maternal Uncle        d.62s unspecified type of cancer  . Breast cancer Cousin 19       paternal first-cousin (daughter of unaffected aunt)  . Cancer Maternal Aunt 9       "Female Cancer"  . Cancer Maternal Aunt        unknown cancer  The patient's father died at age 27 from colon cancer which she  never had treated. The patient's mother is living, 9 years old as of June 2018. The patient has one brother, one sister. On the mother's side there is a history of breast cancer in the grandmother, age 7, and then 1 aunt with breast cancer age 72, and another with lung cancer age 84. There is no history of ovarian cancer in the family.  GYNECOLOGIC HISTORY:  No LMP recorded. Menarche age 3, first live birth age 42. She is GX P4. Her periods have never been regular. She had a period in June 2018 but not for 4 months before that. She took birth control for some years remotely with no complications. She is status post bilateral tubal ligation.  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 Her husband [name] (goes by "[name]") is originally from [name]. The patient's children from her first marriage are [name] "[name]" [name] 47 years old (all ages as of June 2018) recently in the [employer] but now taking courses sent rocking him immunity college, and [name], who is studying psychology at [name]. Together with [name] the patient's children are [name] and [name], aged 24 and 77.     ADVANCED DIRECTIVES: Not in place   HEALTH MAINTENANCE: Social History  Substance Use Topics  . Smoking status: Never Smoker  . Smokeless tobacco: Never Used  . Alcohol use No     Colonoscopy:Never   MWU:XLKG 2018  Bone density:Never   No Known Allergies  Current Outpatient Prescriptions  Medication Sig Dispense Refill  . acetaminophen (TYLENOL) 500 MG tablet Take 2 tablets (1,000 mg total) by mouth every 6 (six) hours as needed for mild pain. For fever/pain    . docusate sodium (COLACE) 100 MG capsule Take 1 capsule (100 mg total) by mouth 2 (two) times daily. (Patient taking differently: Take 200 mg by mouth 2 (two)  times daily. ) 60 capsule 2  . lidocaine-prilocaine (EMLA) cream Apply to affected area once 30 g 3  . lidocaine-prilocaine (EMLA) cream Apply to affected area once 30 g 3  . traZODone (DESYREL) 50 MG tablet Take 1 tablet (50 mg total) by mouth at bedtime as needed for sleep. Take 1 tab x 2 nights, then ok to increase to two tab QHS PRN 60 tablet 0  . venlafaxine XR (EFFEXOR-XR) 37.5 MG 24 hr capsule Take 1 capsule (37.5 mg total) by mouth daily with breakfast. 30 capsule 0   No current facility-administered medications for this visit.    Facility-Administered Medications Ordered in Other Visits  Medication Dose Route Frequency Provider Last Rate Last Dose  . 0.9 %  sodium chloride infusion   Intravenous Once Destiny Hagin, Charlestine Massed, NP      . dexamethasone (DECADRON) 20 mg  in sodium chloride 0.9 % 50 mL IVPB  20 mg Intravenous Once Ava Tangney, Charlestine Massed, NP      . diphenhydrAMINE (BENADRYL) injection 50 mg  50 mg Intravenous Once Tykiera Raven, Charlestine Massed, NP      . famotidine (PEPCID) IVPB 20 mg premix  20 mg Intravenous Once Dereon Williamsen, Charlestine Massed, NP      . heparin lock flush 100 unit/mL  500 Units Intracatheter Once PRN Gardenia Phlegm, NP      . palonosetron (ALOXI) injection 0.25 mg  0.25 mg Intravenous Once Suvan Stcyr, Charlestine Massed, NP      . sodium chloride flush (NS) 0.9 % injection 10 mL  10 mL Intracatheter PRN Gardenia Phlegm, NP        OBJECTIVE:   Vitals:   10/12/16 1451  BP: 124/82  Pulse: 98  Resp: 18  Temp: 98.4 F (36.9 C)  SpO2: 100%     Body mass index is 39.81 kg/m.   Filed Weights   10/12/16 1451  Weight: 258 lb (117 kg)  GENERAL: Patient is a well appearing female in no acute distress HEENT:  Sclerae anicteric.  Oropharynx clear and moist. No ulcerations or evidence of oropharyngeal candidiasis. Neck is supple.  NODES:  No cervical, supraclavicular, or axillary lymphadenopathy palpated.  BREAST EXAM:  Deferred. LUNGS:  Clear to  auscultation bilaterally.  No wheezes or rhonchi. HEART:  Regular rate and rhythm. No murmur appreciated. ABDOMEN:  Soft, nontender.  Positive, normoactive bowel sounds. No organomegaly palpated. MSK:  No focal spinal tenderness to palpation. Full range of motion bilaterally in the upper extremities. EXTREMITIES:  No peripheral edema.   SKIN:  Clear with no obvious rashes or skin changes. No nail dyscrasia. NEURO:  Nonfocal. Well oriented. Tearful. ECOG FS:1 - Symptomatic but completely ambulatory    LAB RESULTS:  CMP     Component Value Date/Time   NA 139 10/12/2016 1435   K 3.4 (L) 10/12/2016 1435   CL 112 (H) 08/28/2016 0434   CO2 25 10/12/2016 1435   GLUCOSE 156 (H) 10/12/2016 1435   BUN 5.5 (L) 10/12/2016 1435   CREATININE 0.8 10/12/2016 1435   CALCIUM 9.2 10/12/2016 1435   PROT 6.5 10/12/2016 1435   ALBUMIN 3.4 (L) 10/12/2016 1435   AST 27 10/12/2016 1435   ALT 38 10/12/2016 1435   ALKPHOS 87 10/12/2016 1435   BILITOT 0.67 10/12/2016 1435   GFRNONAA >60 08/29/2016 0338   GFRAA >60 08/29/2016 0338    No results found for: TOTALPROTELP, ALBUMINELP, A1GS, A2GS, BETS, BETA2SER, GAMS, MSPIKE, SPEI  No results found for: KPAFRELGTCHN, LAMBDASER, [facility]  Lab Results  Component Value Date   WBC 2.0 (L) 10/12/2016   NEUTROABS 1.2 (L) 10/12/2016   HGB 9.6 (L) 10/12/2016   HCT 28.6 (L) 10/12/2016   MCV 90.8 10/12/2016   PLT 189 10/12/2016      Chemistry      Component Value Date/Time   NA 139 10/12/2016 1435   K 3.4 (L) 10/12/2016 1435   CL 112 (H) 08/28/2016 0434   CO2 25 10/12/2016 1435   BUN 5.5 (L) 10/12/2016 1435   CREATININE 0.8 10/12/2016 1435      Component Value Date/Time   CALCIUM 9.2 10/12/2016 1435   ALKPHOS 87 10/12/2016 1435   AST 27 10/12/2016 1435   ALT 38 10/12/2016 1435   BILITOT 0.67 10/12/2016 1435       No results found for: LABCA2  No components found for: [id number]  No results  for input(s): INR in the last 168  hours.  Urinalysis    Component Value Date/Time   COLORURINE STRAW (A) 09/28/2016 1515   APPEARANCEUR CLEAR 09/28/2016 1515   LABSPEC 1.002 (L) 09/28/2016 1515   PHURINE 5.0 09/28/2016 1515   GLUCOSEU NEGATIVE 09/28/2016 1515   HGBUR NEGATIVE 09/28/2016 1515   BILIRUBINUR NEGATIVE 09/28/2016 1515   KETONESUR NEGATIVE 09/28/2016 1515   PROTEINUR NEGATIVE 09/28/2016 1515   UROBILINOGEN 0.2 01/04/2010 1911   NITRITE NEGATIVE 09/28/2016 1515   LEUKOCYTESUR NEGATIVE 09/28/2016 1515     STUDIES: No results found.  ELIGIBLE FOR AVAILABLE RESEARCH PROTOCOL: no   ASSESSMENT: 46 y.o. 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 Additional testing of the patient's mother and maternal aunt is pending. Otherwise [facility]'s Common Hereditary Cancers Panel found no deleterious mutations in APC, ATM, AXIN2, BARD1, BMPR1A, BRCA1, BRCA2, BRIP1, CDH1, CDKN2A, CHEK2, CTNNA1, DICER1, EPCAM, GREM1, HOXB13, KIT, MEN1, MLH1, MSH2, MSH3, MSH6, MUTYH, NBN, NF1, NTHL1, PALB2, PDGFRA, PMS2, POLD1, POLE, PTEN, RAD50, RAD51C, RAD51D, SDHA, SDHB, SDHC, SDHD, SMAD4, SMARCA4, STK11, TP53, TSC1, TSC2, and VHL.  (2) neoadjuvant chemotherapy to consist of doxorubicin and cyclophosphamide in dose dense fashion 4 completed 09/10/2016 to be followed by paclitaxel weekly 12 given with carboplatin   (a) cycle 4 of cyclophosphamide and doxorubicin was delayed 10 days and dose decreased 10% because of febrile neutropenia after cycle 3  (3) definitive surgery to follow  (4) adjuvant radiation to follow surgery  PLAN: Teasia is doing  well today.  Her ANC is slightly low at 1.2.  I reviewed this with [name], and she will receive 3 weeks of chemotherapy, and get onpro (Neulasta) on the third week day 1.  She will proceed with her third Paclitaxel/Carboplatin today.  I ordered onpro for weeks 6, 9, and 12.  She will also start taking Effexor and will likely increase it next week to 40m per day.  I will see her back in 2 weeks and if she tolerates it well, will send in the prescription for 751mper day.    LoEmillyill return in 2 weeks for labs, follow up, and her fourth week of Paclitaxel and Carboplatin.  She knows to call between now and then for any questions or concerns.     LiGardenia PhlegmNP  10/12/16 4:17 PM Medical Oncology and Hematology CoOn[facility] LLC[address] DrivevSkagway[place] [id number]. [phone]  Fax. [phone] ADDENDUM: LoRhyliad a hard time last week and felt she couldn't continue but "letting it all ago" meaning expressing herself and letting people know how she felt helped a lot. The trazodone also helped, and she is now sleeping better.  I strongly encouraged her to start the venlafaxine. A large proportion of my patients greatly benefit from approximately 6 months on this medication during and after chemotherapy. We discussed the possible toxicities, side effects and complications. She tells me she is good to give it a try.  She has equivocal results of BRCA. If she is agreeable to bilateral mastectomies I would be comfortable with that. Otherwise she will have intensified screening.  The plan at this point is to continue chemotherapy but do it 3 weeks on 1 week off with Neulasta on day 2 of the third week treatment. I think this will optimize her chances of a complete pathologic response  I personally saw this patient and performed a substantive portion of this encounter with the listed APP documented above.   Chauncey Cruel, MD Medical Oncology and  Hematology [facility] [address] [place],  [address] Tel. [phone]    Fax. [phone]

## 2016-10-12 ENCOUNTER — Other Ambulatory Visit (HOSPITAL_BASED_OUTPATIENT_CLINIC_OR_DEPARTMENT_OTHER): Payer: Medicaid Other

## 2016-10-12 ENCOUNTER — Ambulatory Visit (HOSPITAL_BASED_OUTPATIENT_CLINIC_OR_DEPARTMENT_OTHER): Payer: Medicaid Other

## 2016-10-12 ENCOUNTER — Other Ambulatory Visit: Payer: Self-pay | Admitting: Oncology

## 2016-10-12 ENCOUNTER — Ambulatory Visit (HOSPITAL_BASED_OUTPATIENT_CLINIC_OR_DEPARTMENT_OTHER): Payer: Medicaid Other | Admitting: Adult Health

## 2016-10-12 ENCOUNTER — Telehealth: Payer: Self-pay | Admitting: Oncology

## 2016-10-12 VITALS — BP 124/82 | HR 98 | Temp 98.4°F | Resp 18 | Ht 67.5 in | Wt 258.0 lb

## 2016-10-12 DIAGNOSIS — C50412 Malignant neoplasm of upper-outer quadrant of left female breast: Secondary | ICD-10-CM

## 2016-10-12 DIAGNOSIS — Z171 Estrogen receptor negative status [ER-]: Secondary | ICD-10-CM | POA: Diagnosis not present

## 2016-10-12 DIAGNOSIS — C773 Secondary and unspecified malignant neoplasm of axilla and upper limb lymph nodes: Secondary | ICD-10-CM

## 2016-10-12 DIAGNOSIS — Z5111 Encounter for antineoplastic chemotherapy: Secondary | ICD-10-CM | POA: Diagnosis not present

## 2016-10-12 LAB — CBC WITH DIFFERENTIAL/PLATELET
BASO%: 0.5 % (ref 0.0–2.0)
Basophils Absolute: 0 10*3/uL (ref 0.0–0.1)
EOS%: 2 % (ref 0.0–7.0)
Eosinophils Absolute: 0 10*3/uL (ref 0.0–0.5)
HCT: 28.6 % — ABNORMAL LOW (ref 34.8–46.6)
HGB: 9.6 g/dL — ABNORMAL LOW (ref 11.6–15.9)
LYMPH%: 28.8 % (ref 14.0–49.7)
MCH: 30.5 pg (ref 25.1–34.0)
MCHC: 33.6 g/dL (ref 31.5–36.0)
MCV: 90.8 fL (ref 79.5–101.0)
MONO#: 0.1 10*3/uL (ref 0.1–0.9)
MONO%: 6.6 % (ref 0.0–14.0)
NEUT#: 1.2 10*3/uL — ABNORMAL LOW (ref 1.5–6.5)
NEUT%: 62.1 % (ref 38.4–76.8)
PLATELETS: 189 10*3/uL (ref 145–400)
RBC: 3.15 10*6/uL — AB (ref 3.70–5.45)
RDW: 16 % — ABNORMAL HIGH (ref 11.2–14.5)
WBC: 2 10*3/uL — AB (ref 3.9–10.3)
lymph#: 0.6 10*3/uL — ABNORMAL LOW (ref 0.9–3.3)

## 2016-10-12 LAB — COMPREHENSIVE METABOLIC PANEL
ALBUMIN: 3.4 g/dL — AB (ref 3.5–5.0)
ALK PHOS: 87 U/L (ref 40–150)
ALT: 38 U/L (ref 0–55)
AST: 27 U/L (ref 5–34)
Anion Gap: 10 mEq/L (ref 3–11)
BUN: 5.5 mg/dL — AB (ref 7.0–26.0)
CALCIUM: 9.2 mg/dL (ref 8.4–10.4)
CHLORIDE: 104 meq/L (ref 98–109)
CO2: 25 mEq/L (ref 22–29)
Creatinine: 0.8 mg/dL (ref 0.6–1.1)
EGFR: >90 mL/min/{1.73_m2} (ref >90)
GLUCOSE: 156 mg/dL — AB (ref 70–140)
Potassium: 3.4 mEq/L — ABNORMAL LOW (ref 3.5–5.1)
Sodium: 139 mEq/L (ref 136–145)
Total Bilirubin: 0.67 mg/dL (ref 0.20–1.20)
Total Protein: 6.5 g/dL (ref 6.4–8.3)

## 2016-10-12 MED ORDER — DIPHENHYDRAMINE HCL 50 MG/ML IJ SOLN
INTRAMUSCULAR | Status: AC
Start: 2016-10-12 — End: 2016-10-12
  Filled 2016-10-12: qty 1

## 2016-10-12 MED ORDER — SODIUM CHLORIDE 0.9% FLUSH
10.0000 mL | INTRAVENOUS | Status: DC | PRN
  Administered 2016-10-12: 10 mL
  Filled 2016-10-12: qty 10

## 2016-10-12 MED ORDER — FAMOTIDINE IN NACL 20-0.9 MG/50ML-% IV SOLN
20.0000 mg | Freq: Once | INTRAVENOUS | Status: AC
  Administered 2016-10-12: 20 mg via INTRAVENOUS

## 2016-10-12 MED ORDER — PALONOSETRON HCL INJECTION 0.25 MG/5ML
INTRAVENOUS | Status: AC
  Filled 2016-10-12: qty 5

## 2016-10-12 MED ORDER — HEPARIN SOD (PORK) LOCK FLUSH 100 UNIT/ML IV SOLN
500.0000 [IU] | Freq: Once | INTRAVENOUS | Status: AC | PRN
  Administered 2016-10-12: 500 [IU]
  Filled 2016-10-12: qty 5

## 2016-10-12 MED ORDER — DIPHENHYDRAMINE HCL 50 MG/ML IJ SOLN
50.0000 mg | Freq: Once | INTRAMUSCULAR | Status: AC
  Administered 2016-10-12: 50 mg via INTRAVENOUS

## 2016-10-12 MED ORDER — SODIUM CHLORIDE 0.9 % IV SOLN
80.0000 mg/m2 | Freq: Once | INTRAVENOUS | Status: AC
  Administered 2016-10-12: 186 mg via INTRAVENOUS
  Filled 2016-10-12: qty 31

## 2016-10-12 MED ORDER — FAMOTIDINE IN NACL 20-0.9 MG/50ML-% IV SOLN
INTRAVENOUS | Status: AC
  Filled 2016-10-12: qty 50

## 2016-10-12 MED ORDER — DEXAMETHASONE SODIUM PHOSPHATE 100 MG/10ML IJ SOLN
20.0000 mg | Freq: Once | INTRAMUSCULAR | Status: AC
  Administered 2016-10-12: 20 mg via INTRAVENOUS
  Filled 2016-10-12: qty 2

## 2016-10-12 MED ORDER — PALONOSETRON HCL INJECTION 0.25 MG/5ML
0.2500 mg | Freq: Once | INTRAVENOUS | Status: AC
  Administered 2016-10-12: 0.25 mg via INTRAVENOUS

## 2016-10-12 MED ORDER — SODIUM CHLORIDE 0.9 % IV SOLN
300.0000 mg | Freq: Once | INTRAVENOUS | Status: AC
  Administered 2016-10-12: 300 mg via INTRAVENOUS
  Filled 2016-10-12: qty 30

## 2016-10-12 MED ORDER — PEGFILGRASTIM 6 MG/0.6ML ~~LOC~~ PSKT
6.0000 mg | PREFILLED_SYRINGE | Freq: Once | SUBCUTANEOUS | Status: AC
  Administered 2016-10-12: 6 mg via SUBCUTANEOUS
  Filled 2016-10-12: qty 0.6

## 2016-10-12 MED ORDER — SODIUM CHLORIDE 0.9 % IV SOLN
Freq: Once | INTRAVENOUS | Status: AC
  Administered 2016-10-12: 16:00:00 via INTRAVENOUS

## 2016-10-12 NOTE — Telephone Encounter (Signed)
Scheduled appts per 10/8 los. Patient will get updated schedule in infusion.

## 2016-10-12 NOTE — Progress Notes (Signed)
Per. Thedore Mins, NP, okay to treat patient with ANC 1.2 and WBC 2.0.

## 2016-10-12 NOTE — Patient Instructions (Addendum)
   [facility] Discharge Instructions for Patients Receiving Chemotherapy  Today you received the following chemotherapy agents Taxol and Carboplatin   To help prevent nausea and vomiting after your treatment, we encourage you to take your nausea medication as directed.    If you develop nausea and vomiting that is not controlled by your nausea medication, call the clinic.   BELOW ARE SYMPTOMS THAT SHOULD BE REPORTED IMMEDIATELY:  *FEVER GREATER THAN 100.5 F  *CHILLS WITH OR WITHOUT FEVER  NAUSEA AND VOMITING THAT IS NOT CONTROLLED WITH YOUR NAUSEA MEDICATION  *UNUSUAL SHORTNESS OF BREATH  *UNUSUAL BRUISING OR BLEEDING  TENDERNESS IN MOUTH AND THROAT WITH OR WITHOUT PRESENCE OF ULCERS  *URINARY PROBLEMS  *BOWEL PROBLEMS  UNUSUAL RASH Items with * indicate a potential emergency and should be followed up as soon as possible.  Feel free to call the clinic should you have any questions or concerns. The clinic phone number is [phone].  Please show the CHEMO ALERT CARD at check-in to the Emergency Department and triage nurse.   

## 2016-10-14 ENCOUNTER — Encounter (HOSPITAL_COMMUNITY): Payer: Self-pay | Admitting: *Deleted

## 2016-10-19 ENCOUNTER — Ambulatory Visit: Payer: Medicaid Other

## 2016-10-19 ENCOUNTER — Other Ambulatory Visit: Payer: Medicaid Other

## 2016-10-19 ENCOUNTER — Ambulatory Visit: Payer: Medicaid Other | Admitting: Oncology

## 2016-10-21 ENCOUNTER — Telehealth: Payer: Self-pay

## 2016-10-21 NOTE — Telephone Encounter (Signed)
Returned pt's vm.  No answer, mailbox full.

## 2016-10-24 ENCOUNTER — Emergency Department (HOSPITAL_BASED_OUTPATIENT_CLINIC_OR_DEPARTMENT_OTHER): Payer: Medicaid Other

## 2016-10-24 ENCOUNTER — Encounter (HOSPITAL_BASED_OUTPATIENT_CLINIC_OR_DEPARTMENT_OTHER): Payer: Self-pay | Admitting: Emergency Medicine

## 2016-10-24 ENCOUNTER — Emergency Department (HOSPITAL_COMMUNITY): Admission: EM | Admit: 2016-10-24 | Discharge: 2016-10-24 | Payer: Medicaid Other | Source: Home / Self Care

## 2016-10-24 ENCOUNTER — Inpatient Hospital Stay (HOSPITAL_BASED_OUTPATIENT_CLINIC_OR_DEPARTMENT_OTHER)
Admission: EM | Admit: 2016-10-24 | Discharge: 2016-10-26 | DRG: 315 | Disposition: A | Discharge status: Home / Self Care | Payer: Medicaid Other | Attending: Internal Medicine | Admitting: Internal Medicine

## 2016-10-24 DIAGNOSIS — L0390 Cellulitis, unspecified: Secondary | ICD-10-CM | POA: Diagnosis not present

## 2016-10-24 DIAGNOSIS — Z79899 Other long term (current) drug therapy: Secondary | ICD-10-CM | POA: Diagnosis not present

## 2016-10-24 DIAGNOSIS — C50412 Malignant neoplasm of upper-outer quadrant of left female breast: Secondary | ICD-10-CM | POA: Diagnosis present

## 2016-10-24 DIAGNOSIS — R509 Fever, unspecified: Secondary | ICD-10-CM

## 2016-10-24 DIAGNOSIS — L03313 Cellulitis of chest wall: Secondary | ICD-10-CM | POA: Diagnosis present

## 2016-10-24 DIAGNOSIS — T451X5A Adverse effect of antineoplastic and immunosuppressive drugs, initial encounter: Secondary | ICD-10-CM | POA: Diagnosis present

## 2016-10-24 DIAGNOSIS — T80212A Local infection due to central venous catheter, initial encounter: Secondary | ICD-10-CM

## 2016-10-24 DIAGNOSIS — Z6841 Body Mass Index (BMI) 40.0 and over, adult: Secondary | ICD-10-CM | POA: Diagnosis not present

## 2016-10-24 DIAGNOSIS — D6489 Other specified anemias: Secondary | ICD-10-CM | POA: Diagnosis present

## 2016-10-24 DIAGNOSIS — E876 Hypokalemia: Secondary | ICD-10-CM | POA: Diagnosis present

## 2016-10-24 DIAGNOSIS — Z171 Estrogen receptor negative status [ER-]: Secondary | ICD-10-CM | POA: Diagnosis not present

## 2016-10-24 DIAGNOSIS — D701 Agranulocytosis secondary to cancer chemotherapy: Secondary | ICD-10-CM | POA: Diagnosis present

## 2016-10-24 DIAGNOSIS — D6959 Other secondary thrombocytopenia: Secondary | ICD-10-CM | POA: Diagnosis present

## 2016-10-24 DIAGNOSIS — Z95828 Presence of other vascular implants and grafts: Secondary | ICD-10-CM | POA: Diagnosis not present

## 2016-10-24 DIAGNOSIS — Y848 Other medical procedures as the cause of abnormal reaction of the patient, or of later complication, without mention of misadventure at the time of the procedure: Secondary | ICD-10-CM | POA: Diagnosis present

## 2016-10-24 DIAGNOSIS — C50411 Malignant neoplasm of upper-outer quadrant of right female breast: Secondary | ICD-10-CM | POA: Diagnosis not present

## 2016-10-24 DIAGNOSIS — K5900 Constipation, unspecified: Secondary | ICD-10-CM | POA: Diagnosis present

## 2016-10-24 DIAGNOSIS — C773 Secondary and unspecified malignant neoplasm of axilla and upper limb lymph nodes: Secondary | ICD-10-CM | POA: Diagnosis not present

## 2016-10-24 DIAGNOSIS — T80219A Unspecified infection due to central venous catheter, initial encounter: Secondary | ICD-10-CM | POA: Diagnosis present

## 2016-10-24 DIAGNOSIS — I10 Essential (primary) hypertension: Secondary | ICD-10-CM | POA: Diagnosis present

## 2016-10-24 DIAGNOSIS — L03319 Cellulitis of trunk, unspecified: Secondary | ICD-10-CM | POA: Diagnosis not present

## 2016-10-24 HISTORY — DX: Malignant neoplasm of unspecified site of unspecified female breast: C50.919

## 2016-10-24 LAB — CBC WITH DIFFERENTIAL/PLATELET
BASOS ABS: 0 10*3/uL (ref 0.0–0.1)
BASOS PCT: 1 %
EOS ABS: 0 10*3/uL (ref 0.0–0.7)
Eosinophils Relative: 0 %
HEMATOCRIT: 29.2 % — AB (ref 36.0–46.0)
Hemoglobin: 9.6 g/dL — ABNORMAL LOW (ref 12.0–15.0)
Lymphocytes Relative: 14 %
Lymphs Abs: 0.7 10*3/uL (ref 0.7–4.0)
MCH: 30.9 pg (ref 26.0–34.0)
MCHC: 32.9 g/dL (ref 30.0–36.0)
MCV: 93.9 fL (ref 78.0–100.0)
MONO ABS: 0.3 10*3/uL (ref 0.1–1.0)
MONOS PCT: 6 %
NEUTROS ABS: 3.9 10*3/uL (ref 1.7–7.7)
NEUTROS PCT: 79 %
Platelets: 98 10*3/uL — ABNORMAL LOW (ref 150–400)
RBC: 3.11 MIL/uL — ABNORMAL LOW (ref 3.87–5.11)
RDW: 17.3 % — AB (ref 11.5–15.5)
WBC: 5 10*3/uL (ref 4.0–10.5)

## 2016-10-24 LAB — COMPREHENSIVE METABOLIC PANEL
ALT: 28 U/L (ref 14–54)
AST: 31 U/L (ref 15–41)
Albumin: 3.4 g/dL — ABNORMAL LOW (ref 3.5–5.0)
Alkaline Phosphatase: 97 U/L (ref 38–126)
Anion gap: 8 (ref 5–15)
BILIRUBIN TOTAL: 0.6 mg/dL (ref 0.3–1.2)
BUN: 5 mg/dL — ABNORMAL LOW (ref 6–20)
CALCIUM: 9.1 mg/dL (ref 8.9–10.3)
CO2: 29 mmol/L (ref 22–32)
CREATININE: 0.54 mg/dL (ref 0.44–1.00)
Chloride: 103 mmol/L (ref 101–111)
GFR calc non Af Amer: >60 mL/min (ref >60)
Glucose, Bld: 132 mg/dL — ABNORMAL HIGH (ref 65–99)
Potassium: 3.1 mmol/L — ABNORMAL LOW (ref 3.5–5.1)
Sodium: 140 mmol/L (ref 135–145)
TOTAL PROTEIN: 7.1 g/dL (ref 6.5–8.1)

## 2016-10-24 LAB — URINALYSIS, ROUTINE W REFLEX MICROSCOPIC
BILIRUBIN URINE: NEGATIVE
Glucose, UA: NEGATIVE mg/dL
Hgb urine dipstick: NEGATIVE
Ketones, ur: NEGATIVE mg/dL
Leukocytes, UA: NEGATIVE
Nitrite: NEGATIVE
Protein, ur: NEGATIVE mg/dL
Specific Gravity, Urine: >1.03 — ABNORMAL HIGH (ref 1.005–1.030)
pH: 5.5 (ref 5.0–8.0)

## 2016-10-24 LAB — PROTIME-INR
INR: 0.95
PROTHROMBIN TIME: 12.6 s (ref 11.4–15.2)

## 2016-10-24 LAB — MAGNESIUM: Magnesium: 1.4 mg/dL — ABNORMAL LOW (ref 1.7–2.4)

## 2016-10-24 LAB — LACTIC ACID, PLASMA: Lactic Acid, Venous: 1.9 mmol/L (ref 0.5–1.9)

## 2016-10-24 MED ORDER — DEXTROSE 5 % IV SOLN
2.0000 g | Freq: Once | INTRAVENOUS | Status: AC
  Administered 2016-10-24: 2 g via INTRAVENOUS

## 2016-10-24 MED ORDER — SODIUM CHLORIDE 0.9 % IV BOLUS (SEPSIS)
1000.0000 mL | Freq: Once | INTRAVENOUS | Status: AC
  Administered 2016-10-24: 1000 mL via INTRAVENOUS

## 2016-10-24 MED ORDER — HYDROCODONE-ACETAMINOPHEN 5-325 MG PO TABS: 1.0000 | ORAL_TABLET | ORAL | Status: DC | PRN

## 2016-10-24 MED ORDER — LORATADINE 10 MG PO TABS: 10.0000 mg | ORAL_TABLET | Freq: Every day | ORAL | Status: DC

## 2016-10-24 MED ORDER — VANCOMYCIN HCL IN DEXTROSE 1-5 GM/200ML-% IV SOLN
1000.0000 mg | Freq: Once | INTRAVENOUS | Status: AC
  Administered 2016-10-24: 1000 mg via INTRAVENOUS
  Filled 2016-10-24: qty 200

## 2016-10-24 MED ORDER — ACETAMINOPHEN 650 MG RE SUPP: 650.0000 mg | Freq: Four times a day (QID) | RECTAL | Status: DC | PRN

## 2016-10-24 MED ORDER — POTASSIUM CHLORIDE CRYS ER 20 MEQ PO TBCR
40.0000 meq | EXTENDED_RELEASE_TABLET | Freq: Once | ORAL | Status: AC
  Administered 2016-10-24: 40 meq via ORAL
  Filled 2016-10-24: qty 2

## 2016-10-24 MED ORDER — SODIUM CHLORIDE 0.9 % IV SOLN
INTRAVENOUS | Status: DC
  Administered 2016-10-24: 21:00:00 via INTRAVENOUS

## 2016-10-24 MED ORDER — ONDANSETRON HCL 4 MG PO TABS: 4.0000 mg | ORAL_TABLET | Freq: Four times a day (QID) | ORAL | Status: DC | PRN

## 2016-10-24 MED ORDER — VENLAFAXINE HCL ER 37.5 MG PO CP24
37.5000 mg | ORAL_CAPSULE | Freq: Every day | ORAL | Status: DC
  Filled 2016-10-24 (×2): qty 1

## 2016-10-24 MED ORDER — SODIUM CHLORIDE 0.9% FLUSH
3.0000 mL | Freq: Two times a day (BID) | INTRAVENOUS | Status: DC
  Administered 2016-10-24 – 2016-10-26 (×3): 3 mL via INTRAVENOUS

## 2016-10-24 MED ORDER — TRAZODONE HCL 50 MG PO TABS
100.0000 mg | ORAL_TABLET | Freq: Every evening | ORAL | Status: DC | PRN
  Administered 2016-10-25 (×2): 100 mg via ORAL
  Filled 2016-10-24 (×2): qty 2

## 2016-10-24 MED ORDER — ONDANSETRON HCL 4 MG/2ML IJ SOLN: 4.0000 mg | Freq: Four times a day (QID) | INTRAMUSCULAR | Status: DC | PRN

## 2016-10-24 MED ORDER — ACETAMINOPHEN 325 MG PO TABS: 650.0000 mg | ORAL_TABLET | Freq: Four times a day (QID) | ORAL | Status: DC | PRN

## 2016-10-24 MED ORDER — CEFEPIME HCL 2 G IJ SOLR
INTRAMUSCULAR | Status: AC
  Filled 2016-10-24: qty 2

## 2016-10-24 NOTE — ED Notes (Signed)
Lactic acid 3.41 reported to Dr. Gilford Raid

## 2016-10-24 NOTE — Progress Notes (Signed)
MD notified, no cardiac monitor needed at this time, pt. to remain on [facility].

## 2016-10-24 NOTE — ED Provider Notes (Signed)
[place] EMERGENCY DEPARTMENT Provider Note   CSN: [id number] Arrival date & time: 10/24/16  1354     History   Chief Complaint Chief Complaint  Patient presents with  . Wound Infection    HPI [name] is a 46 y.o. female.  Pt presents to the ED today with a possible wound infection.  The pt was diagnosed with stage IIIB-C invasive ductal carcinoma.  She is followed by [name].  Pt has been receiving chemotherapy with  doxorubicin and cyclophosphamide given with carboplatin cycle 4 of cyclophosphamide and doxorubicin was delayed 10 days and dose decreased 10% because of febrile neutropenia after cycle 3. Surgery is planned after chemo.  Pt had her last chemo on 10/8.  Yesterday, she developed redness and swelling around port site.  The redness has worsened.  The pt has not had a fever.  She said the port is as sore now as it was when it was placed.          Past Medical History:  Diagnosis Date  . Malignant neoplasm of upper-outer quadrant of left breast in female, estrogen receptor negative ([place]) 06/23/2016    Patient Active Problem List   Diagnosis Date Noted  . Family history of breast cancer 10/08/2016  . Genetic testing 09/21/2016  . Sepsis ([place]) 08/27/2016  . Rash 08/27/2016  . Hypokalemia 08/27/2016  . Neutropenic fever ([place]) 08/26/2016  . Constipation 07/27/2016  . Malignant neoplasm of upper-outer quadrant of left breast in female, estrogen receptor negative ([employer]) 06/23/2016  . Left breast mass 06/12/2016  . Essential hypertension 06/12/2016  . Class 3 severe obesity due to excess calories without serious comorbidity with body mass index (BMI) of 40.0 to 44.9 in adult ([place]) 06/12/2016  . Low vitamin D level 06/12/2016  . Irregular menses 06/12/2016    Past Surgical History:  Procedure Laterality Date  . ANKLE SURGERY    . IR FLUORO GUIDE PORT INSERTION RIGHT  07/14/2016  . IR US GUIDE VASC ACCESS RIGHT  07/14/2016  . TUBAL  LIGATION      OB History    Gravida Para Term Preterm AB Living   6       2 4    SAB TAB Ectopic Multiple Live Births   2       4       Home Medications    Prior to Admission medications   Medication Sig Start Date End Date Taking? Authorizing Provider  acetaminophen (TYLENOL) 500 MG tablet Take 2 tablets (1,000 mg total) by mouth every 6 (six) hours as needed for mild pain. For fever/pain 08/29/16   Mariel Aloe, MD  docusate sodium (COLACE) 100 MG capsule Take 1 capsule (100 mg total) by mouth 2 (two) times daily. Patient taking differently: Take 200 mg by mouth 2 (two) times daily.  07/27/16   [name], MD  lidocaine-prilocaine (EMLA) cream Apply to affected area once 07/17/16   Magrinat, [name], MD  lidocaine-prilocaine (EMLA) cream Apply to affected area once 10/05/16   Gardenia Phlegm, NP  traZODone (DESYREL) 50 MG tablet Take 1 tablet (50 mg total) by mouth at bedtime as needed for sleep. Take 1 tab x 2 nights, then ok to increase to two tab QHS PRN 10/05/16   Gardenia Phlegm, NP  venlafaxine XR (EFFEXOR-XR) 37.5 MG 24 hr capsule Take 1 capsule (37.5 mg total) by mouth daily with breakfast. 10/05/16   Causey, Charlestine Massed, NP    Family History  Family History  Problem Relation Age of Onset  . Diabetes Mother   . Hypertension Mother   . Arthritis Mother   . Colon cancer Father 15       d.76 metastatic at time of diagnosis  . Diabetes Father   . Heart disease Father   . Hypertension Father   . Breast cancer Maternal Grandmother 87       d.60s  . Breast cancer Maternal Aunt 52  . Cervical cancer Maternal Aunt 60  . Breast cancer Maternal Aunt 46       d.50s  . Cancer Maternal Uncle        d.62s unspecified type of cancer  . Breast cancer Cousin 49       paternal first-cousin (daughter of unaffected aunt)  . Cancer Maternal Aunt 75       "Female Cancer"  . Cancer Maternal Aunt        unknown cancer    Social History Social History    Substance Use Topics  . Smoking status: Never Smoker  . Smokeless tobacco: Never Used  . Alcohol use No     Allergies   Patient has no known allergies.   Review of Systems Review of Systems  Skin:       Redness/swelling around port  All other systems reviewed and are negative.    Physical Exam Updated Vital Signs BP 110/76   Pulse 97   Temp 98.3 F (36.8 C) (Oral)   Resp 16   LMP 06/24/2016 Comment: on chemo  SpO2 99%   Physical Exam  Constitutional: She is oriented to person, place, and time. She appears well-developed and well-nourished.  HENT:  Head: Normocephalic and atraumatic.  Right Ear: External ear normal.  Left Ear: External ear normal.  Nose: Nose normal.  Mouth/Throat: Oropharynx is clear and moist.  Eyes: Pupils are equal, round, and reactive to light. Conjunctivae and EOM are normal.  Neck: Normal range of motion. Neck supple.  Cardiovascular: Regular rhythm, normal heart sounds and intact distal pulses.  Tachycardia present.   Pulmonary/Chest: Effort normal and breath sounds normal.  Abdominal: Soft. Bowel sounds are normal.  Musculoskeletal: Normal range of motion.  Neurological: She is alert and oriented to person, place, and time.  Skin: Skin is warm.     Psychiatric: She has a normal mood and affect. Her behavior is normal. Judgment and thought content normal.  Nursing note and vitals reviewed.    ED Treatments / Results  Labs (all labs ordered are listed, but only abnormal results are displayed) Labs Reviewed  COMPREHENSIVE METABOLIC PANEL - Abnormal; Notable for the following:       Result Value   Potassium 3.1 (*)    Glucose, Bld 132 (*)    BUN 5 (*)    Albumin 3.4 (*)    All other components within normal limits  CBC WITH DIFFERENTIAL/PLATELET - Abnormal; Notable for the following:    RBC 3.11 (*)    Hemoglobin 9.6 (*)    HCT 29.2 (*)    RDW 17.3 (*)    Platelets 98 (*)    All other components within normal limits   CULTURE, BLOOD (ROUTINE X 2)  CULTURE, BLOOD (ROUTINE X 2)  PROTIME-INR  URINALYSIS, ROUTINE W REFLEX MICROSCOPIC  I-STAT CG4 LACTIC ACID, ED    EKG  EKG Interpretation  Date/Time:  Saturday October 24 2016 14:34:57 EDT Ventricular Rate:  100 PR Interval:    QRS Duration: 102 QT Interval:  339  QTC Calculation: 438 R Axis:   4 Text Interpretation:  Sinus tachycardia Confirmed by Isla Pence [phone]) on 10/24/2016 2:38:47 PM       Radiology Dg Chest 2 View  Result Date: 10/24/2016 CLINICAL DATA:  Pain at Port-A-Cath site.  Left breast cancer. EXAM: CHEST  2 VIEW COMPARISON:  08/26/2016 FINDINGS: Right-sided Port-A-Cath is unchanged in position, with tip projecting over the mid SVC. Minimal right hemidiaphragm elevation. Midline trachea. Normal heart size and mediastinal contours. No pleural effusion or pneumothorax. Mildly low lung volumes. Clear lungs. IMPRESSION: No acute cardiopulmonary disease. Electronically Signed   By: Abigail Miyamoto M.D.   On: 10/24/2016 15:26    Procedures Procedures (including critical care time)  Medications Ordered in ED Medications  ceFEPIme (MAXIPIME) 2 g injection (not administered)  potassium chloride SA (K-DUR,KLOR-CON) CR tablet 40 mEq (not administered)  sodium chloride 0.9 % bolus 1,000 mL (0 mLs Intravenous Stopped 10/24/16 1610)  vancomycin (VANCOCIN) IVPB 1000 mg/200 mL premix (0 mg Intravenous Stopped 10/24/16 1610)  ceFEPIme (MAXIPIME) 2 g in dextrose 5 % 50 mL IVPB (2 g Intravenous New Bag/Given 10/24/16 1548)  sodium chloride 0.9 % bolus 1,000 mL (1,000 mLs Intravenous New Bag/Given 10/24/16 1612)     Initial Impression / Assessment and Plan / ED Course  I have reviewed the triage vital signs and the nursing notes.  Pertinent labs & imaging results that were available during my care of the patient were reviewed by me and considered in my medical decision making (see chart for details).    ANC 3950   Lactic acid elevated.   Pt d/w hospitalists for admission.  Final Clinical Impressions(s) / ED Diagnoses   Final diagnoses:  Cellulitis of skin  Malignant neoplasm of upper-outer quadrant of left breast in female, estrogen receptor negative ([place])  Hypokalemia  Infection due to Port-A-Cath, initial encounter    New Prescriptions New Prescriptions   No medications on file     Isla Pence, MD 10/24/16 1627

## 2016-10-24 NOTE — H&P (Signed)
[name] QIW:[id number] DOB: [birth year] DOA: 10/24/2016     PCP: [name], MD   Outpatient Specialists: [facility]   Patient coming from:  home Lives With family   Chief Complaint: Redness and swelling around the port  HPI: [name] is a 46 y.o. female with medical history significant of triple negative breast cancer,     Presented with pain and swelling of right port site no associated fevers .  Reports pain around port the  site. at [facility] blood cultures were obtained and she was started on vancomycin and cefepime. Port was originally inserted on July 14, 2016 by IR Dr. Arne Cleveland it has been functioning well otherwise last chemotherapy received through port was October 5. Patient has had history of neutropenic fever after cycle 3 of doxorubicin and cyclophosphamide.  Reports T max at home 99.2 but had night sweats.   Reports had to skip last chemo due to leukopenia and was given Nulasta on 10/8  Reportedly lactic acid at [facility] up to 3 no lab results av available but here LA 1.9   Regarding pertinent Chronic problems: Patient has known history of breast cancer status post port placement on carboplatin and Taxol   IN ER:  Temp (24hrs), Avg:98.7 F (37.1 C), Min:98.3 F (36.8 C), Max:99 F (37.2 C)      on arrival  ED Triage Vitals  Enc Vitals Group     BP 10/24/16 1405 (!) 122/96     Pulse Rate 10/24/16 1405 (!) 110     Resp 10/24/16 1405 20     Temp 10/24/16 1405 98.3 F (36.8 C)     Temp Source 10/24/16 1405 Oral     SpO2 10/24/16 1405 100 %     Weight --      Height --      Head Circumference --      Peak Flow --      Pain Score 10/24/16 1402 9     Pain Loc --      Pain Edu? --      Excl. in [place]? --     Latest 16 99% Hr 92 BP 109/61 Na 140 K 3.1BUN 5 Alb 3.4 WBC5.0 Hg 9.6 PLT 98 INR 0.95 LA  CXR non-acute   Following Medications were ordered in ER: Medications  ceFEPIme (MAXIPIME) 2 g injection (not  administered)  sodium chloride 0.9 % bolus 1,000 mL (0 mLs Intravenous Stopped 10/24/16 1610)  vancomycin (VANCOCIN) IVPB 1000 mg/200 mL premix (0 mg Intravenous Stopped 10/24/16 1610)  ceFEPIme (MAXIPIME) 2 g in dextrose 5 % 50 mL IVPB (0 g Intravenous Stopped 10/24/16 1645)  sodium chloride 0.9 % bolus 1,000 mL (0 mLs Intravenous Stopped 10/24/16 1717)  potassium chloride SA (K-DUR,KLOR-CON) CR tablet 40 mEq (40 mEq Oral Given 10/24/16 1647)   Hospitalist was called for admission for possible port infection  Review of Systems:    Pertinent positives include: erythema around port site  Constitutional:  No weight loss, night sweats, Fevers, chills, fatigue, weight loss  HEENT:  No headaches, Difficulty swallowing,Tooth/dental problems,Sore throat,  No sneezing, itching, ear ache, nasal congestion, post nasal drip,  Cardio-vascular:  No chest pain, Orthopnea, PND, anasarca, dizziness, palpitations.no Bilateral lower extremity swelling  GI:  No heartburn, indigestion, abdominal pain, nausea, vomiting, diarrhea, change in bowel habits, loss of appetite, melena, blood in stool, hematemesis Resp:  no shortness of breath at rest. No dyspnea on exertion, No excess mucus, no productive  cough, No non-productive cough, No coughing up of blood.No change in color of mucus.No wheezing. Skin:  no rash or lesions. No jaundice GU:  no dysuria, change in color of urine, no urgency or frequency. No straining to urinate.  No flank pain.  Musculoskeletal:  No joint pain or no joint swelling. No decreased range of motion. No back pain.  Psych:  No change in mood or affect. No depression or anxiety. No memory loss.  Neuro: no localizing neurological complaints, no tingling, no weakness, no double vision, no gait abnormality, no slurred speech, no confusion  As per HPI otherwise 10 point review of systems negative.   Past Medical History: Past Medical History:  Diagnosis Date  . Malignant neoplasm  of upper-outer quadrant of left breast in female, estrogen receptor negative ([place]) 06/23/2016   Past Surgical History:  Procedure Laterality Date  . ANKLE SURGERY    . IR FLUORO GUIDE PORT INSERTION RIGHT  07/14/2016  . IR US GUIDE VASC ACCESS RIGHT  07/14/2016  . TUBAL LIGATION       Social History:  Ambulatory  independently    reports that she has never smoked. She has never used smokeless tobacco. She reports that she does not drink alcohol or use drugs.  Allergies:  No Known Allergies     Family History:  Family History  Problem Relation Age of Onset  . Diabetes Mother   . Hypertension Mother   . Arthritis Mother   . Colon cancer Father 89       d.76 metastatic at time of diagnosis  . Diabetes Father   . Heart disease Father   . Hypertension Father   . Breast cancer Maternal Grandmother 75       d.60s  . Breast cancer Maternal Aunt 52  . Cervical cancer Maternal Aunt 37  . Breast cancer Maternal Aunt 46       d.50s  . Cancer Maternal Uncle        d.62s unspecified type of cancer  . Breast cancer Cousin 56       paternal first-cousin (daughter of unaffected aunt)  . Cancer Maternal Aunt 2       "Female Cancer"  . Cancer Maternal Aunt        unknown cancer    Medications: Prior to Admission medications   Medication Sig Start Date End Date Taking? Authorizing Provider  docusate sodium (COLACE) 100 MG capsule Take 1 capsule (100 mg total) by mouth 2 (two) times daily. Patient taking differently: Take 200 mg by mouth 2 (two) times daily.  07/27/16  Yes [name], MD  lidocaine-prilocaine (EMLA) cream Apply to affected area once 07/17/16  Yes Magrinat, [name], MD  loratadine (CLARITIN) 10 MG tablet Take 10 mg by mouth daily. On chemo days   Yes [provider]  prochlorperazine (COMPAZINE) 10 MG tablet Take 10 mg by mouth every 6 (six) hours as needed for nausea or vomiting. On day of and three days after chemo   Yes [provider]    traZODone (DESYREL) 50 MG tablet Take 1 tablet (50 mg total) by mouth at bedtime as needed for sleep. Take 1 tab x 2 nights, then ok to increase to two tab QHS PRN Patient taking differently: Take 100 mg by mouth at bedtime as needed for sleep.  10/05/16  Yes Causey, Charlestine Massed, NP  acetaminophen (TYLENOL) 500 MG tablet Take 2 tablets (1,000 mg total) by mouth every 6 (six) hours as needed  for mild pain. For fever/pain Patient not taking: Reported on 10/24/2016 08/29/16   Mariel Aloe, MD  lidocaine-prilocaine (EMLA) cream Apply to affected area once Patient not taking: Reported on 10/24/2016 10/05/16   Gardenia Phlegm, NP  venlafaxine XR (EFFEXOR-XR) 37.5 MG 24 hr capsule Take 1 capsule (37.5 mg total) by mouth daily with breakfast. 10/05/16   Gardenia Phlegm, NP    Physical Exam: Patient Vitals for the past 24 hrs:  BP Temp Temp src Pulse Resp SpO2  10/24/16 1847 127/84 99 F (37.2 C) Oral 89 18 100 %  10/24/16 1747 109/61 - - 92 16 99 %  10/24/16 1700 103/76 - - - (!) 21 -  10/24/16 1630 100/76 - - - 18 -  10/24/16 1608 110/76 - - 97 16 99 %  10/24/16 1600 94/68 - - 94 17 99 %  10/24/16 1534 113/80 - - 93 (!) 22 98 %  10/24/16 1405 (!) 122/96 98.3 F (36.8 C) Oral (!) 110 20 100 %    1. General:  in No Acute distress   Chronically ill -appearing 2. Psychological: Alert and Oriented 3. Head/ENT:    Dry Mucous Membranes                          Head Non traumatic, neck supple                          Normal  Dentition 4. SKIN:  decreased Skin turgor,  Skin clean Dry and intact some erythema and warmth no fluctuance  around the port noted    5. Heart: Regular rate and rhythm no  Murmur, no Rub or gallop 6. Lungs: Clear to auscultation bilaterally, no wheezes or crackles   7. Abdomen: Soft,  non-tender, Non distended  obese  bowel sounds present 8. Lower extremities: no clubbing, cyanosis, or edema 9. Neurologically Grossly intact, moving all 4 extremities  equally   10. MSK: Normal range of motion   body mass index is unknown because there is no height or weight on file.  Labs on Admission:   Labs on Admission: I have personally reviewed following labs and imaging studies  CBC:  Recent Labs Lab 10/24/16 1455  WBC 5.0  NEUTROABS 3.9  HGB 9.6*  HCT 29.2*  MCV 93.9  PLT 98*   Basic Metabolic Panel:  Recent Labs Lab 10/24/16 1455  NA 140  K 3.1*  CL 103  CO2 29  GLUCOSE 132*  BUN 5*  CREATININE 0.54  CALCIUM 9.1   GFR: Estimated Creatinine Clearance: 116 mL/min (by C-G formula based on SCr of 0.54 mg/dL). Liver Function Tests:  Recent Labs Lab 10/24/16 1455  AST 31  ALT 28  ALKPHOS 97  BILITOT 0.6  PROT 7.1  ALBUMIN 3.4*   No results for input(s): LIPASE, AMYLASE in the last 168 hours. No results for input(s): AMMONIA in the last 168 hours. Coagulation Profile:  Recent Labs Lab 10/24/16 1455  INR 0.95   Cardiac Enzymes: No results for input(s): CKTOTAL, CKMB, CKMBINDEX, TROPONINI in the last 168 hours. BNP (last 3 results) No results for input(s): PROBNP in the last 8760 hours. HbA1C: No results for input(s): HGBA1C in the last 72 hours. CBG: No results for input(s): GLUCAP in the last 168 hours. Lipid Profile: No results for input(s): CHOL, HDL, LDLCALC, TRIG, CHOLHDL, LDLDIRECT in the last 72 hours. Thyroid Function Tests: No results for  input(s): TSH, T4TOTAL, FREET4, T3FREE, THYROIDAB in the last 72 hours. Anemia Panel: No results for input(s): VITAMINB12, FOLATE, FERRITIN, TIBC, IRON, RETICCTPCT in the last 72 hours. Urine analysis:    Component Value Date/Time   COLORURINE YELLOW 10/24/2016 1621   APPEARANCEUR CLEAR 10/24/2016 1621   LABSPEC >1.030 (H) 10/24/2016 1621   PHURINE 5.5 10/24/2016 1621   GLUCOSEU NEGATIVE 10/24/2016 1621   HGBUR NEGATIVE 10/24/2016 1621   BILIRUBINUR NEGATIVE 10/24/2016 1621   KETONESUR NEGATIVE 10/24/2016 1621   PROTEINUR NEGATIVE 10/24/2016 1621    UROBILINOGEN 0.2 01/04/2010 1911   NITRITE NEGATIVE 10/24/2016 1621   LEUKOCYTESUR NEGATIVE 10/24/2016 1621   Sepsis Labs: @LABRCNTIP (procalcitonin:4,lacticidven:4) )No results found for this or any previous visit (from the past 240 hour(s)).     UA no evidence of UTI     Lab Results  Component Value Date   HGBA1C 5.2 06/12/2016    Estimated Creatinine Clearance: 116 mL/min (by C-G formula based on SCr of 0.54 mg/dL).  BNP (last 3 results) No results for input(s): PROBNP in the last 8760 hours.   ECG REPORT  Independently reviewed Rate:101  Rhythm: sinus tachycardia  ST&T Change: No acute ischemic changes  QTC 438  There were no vitals filed for this visit.   Cultures:    Component Value Date/Time   SDES URINE, CLEAN CATCH 08/27/2016 0520   SPECREQUEST NONE 08/27/2016 0520   CULT  08/27/2016 0520    NO GROWTH Performed at [facility], [place] [address], [place], [place] [address]    REPTSTATUS 08/28/2016 FINAL 08/27/2016 0520     Radiological Exams on Admission: Dg Chest 2 View  Result Date: 10/24/2016 CLINICAL DATA:  Pain at Port-A-Cath site.  Left breast cancer. EXAM: CHEST  2 VIEW COMPARISON:  08/26/2016 FINDINGS: Right-sided Port-A-Cath is unchanged in position, with tip projecting over the mid SVC. Minimal right hemidiaphragm elevation. Midline trachea. Normal heart size and mediastinal contours. No pleural effusion or pneumothorax. Mildly low lung volumes. Clear lungs. IMPRESSION: No acute cardiopulmonary disease. Electronically Signed   By: Abigail Miyamoto M.D.   On: 10/24/2016 15:26    Chart has been reviewed    Assessment/Plan   46 y.o. female with medical history significant of triple negative breast cancer,  Admitted for cellulitis around Port-A-Cath  Present on Admission: . Cellulitis  - over Port-a- cath, blood cultures from the port not obtained in ER due to strong suspicion of overlying infection peripheral cultures obtained patient already  received doses of vancomycin and cefepime. No evidence of severe sepsis or hemodynamic instability. Appreciate oncology and interventional radiology input. For now continue to treat with broad-spectrum antibiotics . Hypokalemia  -replace and check mag . Essential hypertension currently stable not on any home medications will allow some permissive hypertension for the 9   Other plan as per orders.  DVT prophylaxis:  SCD   Code Status:  FULL CODE  as per patient   Family Communication:   Family   at  Bedside  plan of care was discussed with Son, Daughter,   Disposition Plan:     To home once workup is complete and patient is stable              Consults called: Spoke to Dr. Irene Limbo [facility]cology   Admission status:   inpatient       Level of care    medical floor     I have spent a total of 48min on this admission  extra time was  spent to discuss case with consultants  McCreary 10/24/2016, 8:54 PM    [facility]  Pager [phone]   after 2 AM please page floor coverage PA If 7AM-7PM, please contact the day team taking care of the patient  [email or web address]  Password [email or web address]

## 2016-10-24 NOTE — ED Notes (Signed)
Patient transported to X-ray 

## 2016-10-24 NOTE — Progress Notes (Signed)
Pt. arrived to floor via stretcher from [facility]. Alert and oriented x 4, no respiratory distress noted.

## 2016-10-24 NOTE — ED Triage Notes (Signed)
PT presents with c/o redness and pain to right chest port a cath site. PT received chemo thru this port Oct 5th.

## 2016-10-24 NOTE — Progress Notes (Signed)
H/o triple negative breast cancer uner neoadjuvant chemotherapy, last on 10/8 with carboplatin and taxol, presented to [facility] with redness and pain to right chest port site.  No fever, no leukocytosis or neutropenia, does has elevated lactic acid.   blood culture obtained, she received ivf /vanc/cefepime, med surg bed requested.  Please call flow manager once patient arrives.

## 2016-10-25 ENCOUNTER — Encounter (HOSPITAL_COMMUNITY): Payer: Self-pay | Admitting: *Deleted

## 2016-10-25 DIAGNOSIS — L0390 Cellulitis, unspecified: Secondary | ICD-10-CM

## 2016-10-25 DIAGNOSIS — T80219A Unspecified infection due to central venous catheter, initial encounter: Principal | ICD-10-CM

## 2016-10-25 LAB — CBC
HCT: 25.2 % — ABNORMAL LOW (ref 36.0–46.0)
Hemoglobin: 8.4 g/dL — ABNORMAL LOW (ref 12.0–15.0)
MCH: 31.2 pg (ref 26.0–34.0)
MCHC: 33.3 g/dL (ref 30.0–36.0)
MCV: 93.7 fL (ref 78.0–100.0)
PLATELETS: 97 10*3/uL — AB (ref 150–400)
RBC: 2.69 MIL/uL — AB (ref 3.87–5.11)
RDW: 17.4 % — ABNORMAL HIGH (ref 11.5–15.5)
WBC: 4.4 10*3/uL (ref 4.0–10.5)

## 2016-10-25 LAB — PHOSPHORUS: Phosphorus: 3.6 mg/dL (ref 2.5–4.6)

## 2016-10-25 LAB — COMPREHENSIVE METABOLIC PANEL
ALBUMIN: 2.9 g/dL — AB (ref 3.5–5.0)
ALT: 25 U/L (ref 14–54)
ANION GAP: 6 (ref 5–15)
AST: 23 U/L (ref 15–41)
Alkaline Phosphatase: 84 U/L (ref 38–126)
BUN: <5 mg/dL — ABNORMAL LOW (ref 6–20)
CHLORIDE: 107 mmol/L (ref 101–111)
CO2: 27 mmol/L (ref 22–32)
Calcium: 8.5 mg/dL — ABNORMAL LOW (ref 8.9–10.3)
Creatinine, Ser: 0.51 mg/dL (ref 0.44–1.00)
GFR calc non Af Amer: >60 mL/min (ref >60)
GLUCOSE: 102 mg/dL — AB (ref 65–99)
Potassium: 3.9 mmol/L (ref 3.5–5.1)
SODIUM: 140 mmol/L (ref 135–145)
Total Bilirubin: 0.8 mg/dL (ref 0.3–1.2)
Total Protein: 6 g/dL — ABNORMAL LOW (ref 6.5–8.1)

## 2016-10-25 LAB — TSH: TSH: 1.902 u[IU]/mL (ref 0.350–4.500)

## 2016-10-25 LAB — MAGNESIUM: MAGNESIUM: 1.6 mg/dL — AB (ref 1.7–2.4)

## 2016-10-25 MED ORDER — DOCUSATE SODIUM 100 MG PO CAPS
200.0000 mg | ORAL_CAPSULE | Freq: Two times a day (BID) | ORAL | Status: DC
  Administered 2016-10-25 – 2016-10-26 (×3): 200 mg via ORAL
  Filled 2016-10-25 (×3): qty 2

## 2016-10-25 MED ORDER — DEXTROSE 5 % IV SOLN
2.0000 g | Freq: Three times a day (TID) | INTRAVENOUS | Status: DC
  Administered 2016-10-25 – 2016-10-26 (×2): 2 g via INTRAVENOUS
  Filled 2016-10-25 (×3): qty 2

## 2016-10-25 MED ORDER — VANCOMYCIN HCL IN DEXTROSE 1-5 GM/200ML-% IV SOLN
1000.0000 mg | Freq: Three times a day (TID) | INTRAVENOUS | Status: DC
  Administered 2016-10-26 (×2): 1000 mg via INTRAVENOUS

## 2016-10-25 MED ORDER — DEXTROSE 5 % IV SOLN
2.0000 g | Freq: Three times a day (TID) | INTRAVENOUS | Status: DC
  Administered 2016-10-25: 2 g via INTRAVENOUS
  Filled 2016-10-25 (×2): qty 2

## 2016-10-25 MED ORDER — CEFAZOLIN SODIUM-DEXTROSE 2-4 GM/100ML-% IV SOLN
2.0000 g | Freq: Three times a day (TID) | INTRAVENOUS | Status: DC
  Filled 2016-10-25 (×2): qty 100

## 2016-10-25 NOTE — Progress Notes (Signed)
Pharmacy Antibiotic Note  [name] is a 46 y.o. female admitted on 10/24/2016 with redness, pain, swelling at [facility] site, no fever noted, not neutropenic. Received Vancomycin and Cefepime x1 at [facility]. Transferred to [facility] 10/20 pm, abx not continued, Cefepime and Vancomycin protocol orders not released. Cefepime ordered this am for sepsis, but patient does not meet sepsis criteria, Blood cx 10/20 have not resulted with BCID, suggested Ancef for cellulitis.  Plan: Cefepime 2gm q12 ordered, changed to q8, received one dose Cefepime, then order discontinued by MD Text paged MD since no further abx orders, see I-vent, she was not aware order d/c Begin Ancef 2gm q8 IR to eval PAC site 10/22    Temp (24hrs), Avg:98.6 F (37 C), Min:98.3 F (36.8 C), Max:99 F (37.2 C)   Recent Labs Lab 10/24/16 1455 10/24/16 1914 10/25/16 0410  WBC 5.0  --  4.4  CREATININE 0.54  --  0.51  LATICACIDVEN  --  1.9  --     Estimated Creatinine Clearance: 116 mL/min (by C-G formula based on SCr of 0.51 mg/dL).    No Known Allergies  Antimicrobials this admission: 10/20 Cefepime >> 10/21 10/20 Vanc x1 >>  10/21 Ancef  Dose adjustments this admission:  Microbiology results: 10/20 BCx: sent  Thank you for allowing pharmacy to be a part of this patient's care.  Minda Ditto 10/25/2016 10:34 AM

## 2016-10-25 NOTE — Progress Notes (Signed)
PROGRESS NOTE    [name]  WRU:[id number] DOB: 1970/10/23 DOA: 10/24/2016 PCP: [name], MD  Brief Narrative: A 46 year old female with a history of triple negative breast cancer with a port in the right upper chest admitted with fever and swelling and redness at the site of the port she is being admitted for cellulitis and was given vancomycin and cefepime in the ER.  Blood cultures have been drawn which are pending at this time patient is currently on chemotherapy patient has had the port placed on July 10 of 2018 by interventional radiology.  She does complain of constipation today.  She was also being given IV fluids she is reports that her body swelling up from fluids.  She denies any nausea or vomiting she does complain of constipation no chest pain shortness of breath .  Assessment & Plan:   Active Problems:   Essential hypertension   Malignant neoplasm of upper-outer quadrant of left breast in female, estrogen receptor negative (H[facility])   Hypokalemia   Cellulitis  Cellulitis overlying the right chest port-continue vancomycin and cefepime follow-up blood cultures.  This patient has a history of neutropenic fever after doxorubicin and cyclophosphamide.  Hypokalemia potassium repleted.  Malignant neoplasm of the left breast estrogen receptor negative.  Constipation restart Colace 200 twice daily like how she takes at home.  Anemia and thrombocytopenia question chemo related follow-up levels tomorrow.  DVT prophylaxis: SCD Code Status full code Family Communication no family available Disposition Plan: Continue antibiotics follow-up cultures discharge once stable. Consultants: None Procedures:  Antimicrobials: Vancomycin and cefepime Subjective: Feels better complains of constipation  Objective: Vitals:   10/24/16 1747 10/24/16 1847 10/25/16 0005 10/25/16 0650  BP: 109/61 127/84 (!) 119/91 118/69  Pulse: 92 89 89 85  Resp: 16 18 20 20   Temp:  99 F (37.2  C) 98.6 F (37 C) 98.5 F (36.9 C)  TempSrc:  Oral Oral Oral  SpO2: 99% 100% 100% 100%    Intake/Output Summary (Last 24 hours) at 10/25/16 1037 Last data filed at 10/25/16 0500  Gross per 24 hour  Intake             3150 ml  Output                0 ml  Net             3150 ml   There were no vitals filed for this visit.  Examination:  General exam: Appears calm and comfortable  Respiratory system: Clear to auscultation. Respiratory effort normal. Cardiovascular system: S1 & S2 heard, RRR. No JVD, murmurs, rubs, gallops or clicks. No pedal edema. Gastrointestinal system: Abdomen is nondistended, soft and nontender. No organomegaly or masses felt. Normal bowel sounds heard. Central nervous system: Alert and oriented. No focal neurological deficits. Extremities: Symmetric 5 x 5 power. Skin: Skin overlying the right upper chest where the port is looks erythematous.  And tender. Psychiatry: Judgement and insight appear normal. Mood & affect appropriate.     Data Reviewed: I have personally reviewed following labs and imaging studies  CBC:  Recent Labs Lab 10/24/16 1455 10/25/16 0410  WBC 5.0 4.4  NEUTROABS 3.9  --   HGB 9.6* 8.4*  HCT 29.2* 25.2*  MCV 93.9 93.7  PLT 98* 97*   Basic Metabolic Panel:  Recent Labs Lab 10/24/16 1455 10/24/16 2203 10/25/16 0410  NA 140  --  140  K 3.1*  --  3.9  CL 103  --  107  CO2 29  --  27  GLUCOSE 132*  --  102*  BUN 5*  --  <5*  CREATININE 0.54  --  0.51  CALCIUM 9.1  --  8.5*  MG  --  1.4* 1.6*  PHOS  --   --  3.6   GFR: Estimated Creatinine Clearance: 116 mL/min (by C-G formula based on SCr of 0.51 mg/dL). Liver Function Tests:  Recent Labs Lab 10/24/16 1455 10/25/16 0410  AST 31 23  ALT 28 25  ALKPHOS 97 84  BILITOT 0.6 0.8  PROT 7.1 6.0*  ALBUMIN 3.4* 2.9*   No results for input(s): LIPASE, AMYLASE in the last 168 hours. No results for input(s): AMMONIA in the last 168 hours. Coagulation  Profile:  Recent Labs Lab 10/24/16 1455  INR 0.95   Cardiac Enzymes: No results for input(s): CKTOTAL, CKMB, CKMBINDEX, TROPONINI in the last 168 hours. BNP (last 3 results) No results for input(s): PROBNP in the last 8760 hours. HbA1C: No results for input(s): HGBA1C in the last 72 hours. CBG: No results for input(s): GLUCAP in the last 168 hours. Lipid Profile: No results for input(s): CHOL, HDL, LDLCALC, TRIG, CHOLHDL, LDLDIRECT in the last 72 hours. Thyroid Function Tests:  Recent Labs  10/25/16 0410  TSH 1.902   Anemia Panel: No results for input(s): VITAMINB12, FOLATE, FERRITIN, TIBC, IRON, RETICCTPCT in the last 72 hours. Sepsis Labs:  Recent Labs Lab 10/24/16 1914  LATICACIDVEN 1.9    No results found for this or any previous visit (from the past 240 hour(s)).       Radiology Studies: Dg Chest 2 View  Result Date: 10/24/2016 CLINICAL DATA:  Pain at Port-A-Cath site.  Left breast cancer. EXAM: CHEST  2 VIEW COMPARISON:  08/26/2016 FINDINGS: Right-sided Port-A-Cath is unchanged in position, with tip projecting over the mid SVC. Minimal right hemidiaphragm elevation. Midline trachea. Normal heart size and mediastinal contours. No pleural effusion or pneumothorax. Mildly low lung volumes. Clear lungs. IMPRESSION: No acute cardiopulmonary disease. Electronically Signed   By: Abigail Miyamoto M.D.   On: 10/24/2016 15:26        Scheduled Meds: . docusate sodium  200 mg Oral BID  . sodium chloride flush  3 mL Intravenous Q12H  . venlafaxine XR  37.5 mg Oral Q breakfast   Continuous Infusions: . ceFEPime (MAXIPIME) IV       LOS: 1 day      Georgette Shell, MD [facility]   If 7PM-7AM, please contact night-coverage [email or web address] Password [email or web address] 10/25/2016, 10:37 AM

## 2016-10-25 NOTE — Progress Notes (Signed)
Pharmacy Antibiotic Note  [name] is a 46 y.o. female admitted on 10/24/2016 with redness, pain, swelling at [facility] site, no fever noted, not neutropenic. Received Vancomycin and Cefepime x1 at [facility]. Transferred to [facility] 10/20 pm, abx not continued, Cefepime and Vancomycin protocol orders not released. Cefepime ordered this am for sepsis, but patient does not meet sepsis criteria, Blood cx 10/20 have not resulted with BCID, suggested Ancef for cellulitis.  10/21: Pharmacy is asked to resume Cefepime and Vancomycin. Did not receive any doses of Ancef.  Plan: Resume Cefepime 2g q8h Start Vancomycin at 1g q8h Measure Vanc trough at steady state. Follow up renal fxn, culture results, and clinical course. IR to eval PAC site 10/22    Temp (24hrs), Avg:98.3 F (36.8 C), Min:97.9 F (36.6 C), Max:98.6 F (37 C)   Recent Labs Lab 10/24/16 1455 10/24/16 1914 10/25/16 0410  WBC 5.0  --  4.4  CREATININE 0.54  --  0.51  LATICACIDVEN  --  1.9  --     Estimated Creatinine Clearance: 116 mL/min (by C-G formula based on SCr of 0.51 mg/dL).    No Known Allergies  Antimicrobials this admission: 10/20 Vanc x 1 >> 10/21 Vanc >> 10/20 Cefepime >>   Dose adjustments this admission:  Microbiology results: 10/20 BCx: sent  Thank you for allowing pharmacy to be a part of this patient's care.  Romeo Rabon, PharmD. Mobile: [phone]. 10/25/2016,7:51 PM.

## 2016-10-25 NOTE — Progress Notes (Signed)
Oncology Short note  Informed of patients hospitalization by admitting hospitalist. Concern for some low grade fevers and redness and induration around the port ? Cellulitis vs minimal chemo infiltration and irritation from tilted port. No fluctuance. Afebrile since hospitalization. Culture results pending. On broad spectrum abx. Not neutropenic. Patient notes significant clinical improvement overnight and decreased redness and swelling around port site. Plan -IV Abx per hospitalist -Will message and inform Dr Curlene Labrum about hospitalization for f/u tomorrow. -no clear indication at this time for port removal -- if depend on clinical improvement and culture results. -could consult IR tomorrow if need for port reservoir position assessment -- appears tilted and might cause issues with assess/chemo infiltration. -would hold off assessing port at this time to avoid passing need needle through potentially cellulitic area into port.  Sullivan Lone MD MS

## 2016-10-26 ENCOUNTER — Other Ambulatory Visit: Payer: Self-pay | Admitting: Oncology

## 2016-10-26 ENCOUNTER — Other Ambulatory Visit: Payer: Medicaid Other

## 2016-10-26 ENCOUNTER — Telehealth: Payer: Self-pay

## 2016-10-26 ENCOUNTER — Ambulatory Visit (HOSPITAL_BASED_OUTPATIENT_CLINIC_OR_DEPARTMENT_OTHER): Payer: Medicaid Other | Admitting: Oncology

## 2016-10-26 ENCOUNTER — Ambulatory Visit: Payer: Medicaid Other

## 2016-10-26 DIAGNOSIS — L03319 Cellulitis of trunk, unspecified: Secondary | ICD-10-CM

## 2016-10-26 DIAGNOSIS — Z171 Estrogen receptor negative status [ER-]: Secondary | ICD-10-CM

## 2016-10-26 DIAGNOSIS — C50412 Malignant neoplasm of upper-outer quadrant of left female breast: Secondary | ICD-10-CM

## 2016-10-26 DIAGNOSIS — C773 Secondary and unspecified malignant neoplasm of axilla and upper limb lymph nodes: Secondary | ICD-10-CM

## 2016-10-26 DIAGNOSIS — C50411 Malignant neoplasm of upper-outer quadrant of right female breast: Secondary | ICD-10-CM

## 2016-10-26 LAB — CBC WITH DIFFERENTIAL/PLATELET
BASOS PCT: 1 %
Basophils Absolute: 0 10*3/uL (ref 0.0–0.1)
Eosinophils Absolute: 0 10*3/uL (ref 0.0–0.7)
Eosinophils Relative: 1 %
HEMATOCRIT: 25.3 % — AB (ref 36.0–46.0)
HEMOGLOBIN: 8.4 g/dL — AB (ref 12.0–15.0)
LYMPHS PCT: 23 %
Lymphs Abs: 0.7 10*3/uL (ref 0.7–4.0)
MCH: 30.7 pg (ref 26.0–34.0)
MCHC: 33.2 g/dL (ref 30.0–36.0)
MCV: 92.3 fL (ref 78.0–100.0)
MONO ABS: 0.2 10*3/uL (ref 0.1–1.0)
MONOS PCT: 6 %
NEUTROS ABS: 2.3 10*3/uL (ref 1.7–7.7)
NEUTROS PCT: 70 %
Platelets: 93 10*3/uL — ABNORMAL LOW (ref 150–400)
RBC: 2.74 MIL/uL — ABNORMAL LOW (ref 3.87–5.11)
RDW: 17.1 % — AB (ref 11.5–15.5)
WBC: 3.2 10*3/uL — ABNORMAL LOW (ref 4.0–10.5)

## 2016-10-26 LAB — BASIC METABOLIC PANEL
ANION GAP: 8 (ref 5–15)
BUN: 5 mg/dL — ABNORMAL LOW (ref 6–20)
CALCIUM: 8.7 mg/dL — AB (ref 8.9–10.3)
CO2: 27 mmol/L (ref 22–32)
Chloride: 105 mmol/L (ref 101–111)
Creatinine, Ser: 0.52 mg/dL (ref 0.44–1.00)
GFR calc non Af Amer: >60 mL/min (ref >60)
Glucose, Bld: 103 mg/dL — ABNORMAL HIGH (ref 65–99)
Potassium: 3.7 mmol/L (ref 3.5–5.1)
Sodium: 140 mmol/L (ref 135–145)

## 2016-10-26 LAB — I-STAT CG4 LACTIC ACID, ED: Lactic Acid, Venous: 3.41 mmol/L (ref 0.5–1.9)

## 2016-10-26 MED ORDER — CEPHALEXIN 500 MG PO CAPS: 500.0000 mg | ORAL_CAPSULE | Freq: Four times a day (QID) | ORAL | 0 refills | Status: AC

## 2016-10-26 MED ORDER — CEFAZOLIN SODIUM-DEXTROSE 2-4 GM/100ML-% IV SOLN
2.0000 g | Freq: Three times a day (TID) | INTRAVENOUS | Status: DC
  Administered 2016-10-26: 2 g via INTRAVENOUS
  Filled 2016-10-26 (×2): qty 100

## 2016-10-26 MED FILL — CEPHALEXIN 500 MG CAPSULE: 500 | 10 days supply | Qty: 40 | Fill #0

## 2016-10-26 NOTE — Progress Notes (Deleted)
PROGRESS NOTE    [name]  JGG:[id number] DOB: 06-24-1970 DOA: 10/24/2016 PCP: [name], MD  Brief Narrative:46 year old female with a history of triple negative breast cancer with a port in the right upper chest admitted with fever and swelling and redness at the site of the port she is being admitted for cellulitis and was given vancomycin and cefepime in the ER.  Blood cultures have been drawn which are pending at this time patient is currently on chemotherapy patient has had the port placed on July 10 of 2018 by interventional radiology.  She does complain of constipation today.  She was also being given IV fluids she is reports that her body swelling up from fluids.  She denies any nausea or vomiting she does complain of constipation no chest pain shortness of breath .  IR and onc notes reviwed.to be started on ancef today.vanco and rocephin to be dcd.patient feels better,no fever.decreased pain.  Assessment & Plan:   Active Problems:   Essential hypertension   Malignant neoplasm of upper-outer quadrant of left breast in female, estrogen receptor negative (HCC)   Hypokalemia   Cellulitis   Cellulitis porta cath site -decraese erythema,tenderness.blood cx pending from the periphery.  ER NEG BREAST CA  CHRONIC ANEMIA/THROMBOCYTOPENIA   PLAN WAIT FOR FINAL CULTURE,CONTINUE IV ANCEF. DVT prophylaxis:SCD Code Status:FULL Family Communication:NONE Disposition Plan: TBD Consultants:    ONC/IR Procedures:  Antimicrobials:ANCEF Subjective:FEELS BETTER   Objective: Vitals:   10/25/16 1302 10/25/16 2046 10/26/16 0625 10/26/16 0655  BP: 127/85 117/75  (!) 95/59  Pulse: 88 100  82  Resp: 16 20  20   Temp: 97.9 F (36.6 C) 98.8 F (37.1 C)  98.5 F (36.9 C)  TempSrc: Oral Oral  Oral  SpO2: 99% 99%  99%  Weight:    119.2 kg (262 lb 12.8 oz)  Height:   5' 7.5" (1.715 m)     Intake/Output Summary (Last 24 hours) at 10/26/16 1232 Last data filed at 10/26/16  0317  Gross per 24 hour  Intake              783 ml  Output                0 ml  Net              783 ml   Filed Weights   10/26/16 0655  Weight: 119.2 kg (262 lb 12.8 oz)    Examination:  General exam: Appears calm and comfortable  Respiratory system: Clear to auscultation. Respiratory effort normal. Cardiovascular system: S1 & S2 heard, RRR. No JVD, murmurs, rubs, gallops or clicks. No pedal edema. Gastrointestinal system: Abdomen is nondistended, soft and nontender. No organomegaly or masses felt. Normal bowel sounds heard. Central nervous system: Alert and oriented. No focal neurological deficits. Extremities: Symmetric 5 x 5 power. Skin: ERYTHEMA RIGHT UPPER CHEST DECREASED Psychiatry: Judgement and insight appear normal. Mood & affect appropriate.     Data Reviewed: I have personally reviewed following labs and imaging studies  CBC:  Recent Labs Lab 10/24/16 1455 10/25/16 0410 10/26/16 0409  WBC 5.0 4.4 3.2*  NEUTROABS 3.9  --  2.3  HGB 9.6* 8.4* 8.4*  HCT 29.2* 25.2* 25.3*  MCV 93.9 93.7 92.3  PLT 98* 97* 93*   Basic Metabolic Panel:  Recent Labs Lab 10/24/16 1455 10/24/16 2203 10/25/16 0410 10/26/16 0409  NA 140  --  140 140  K 3.1*  --  3.9 3.7  CL 103  --  107 105  CO2 29  --  27 27  GLUCOSE 132*  --  102* 103*  BUN 5*  --  <5* 5*  CREATININE 0.54  --  0.51 0.52  CALCIUM 9.1  --  8.5* 8.7*  MG  --  1.4* 1.6*  --   PHOS  --   --  3.6  --    GFR: Estimated Creatinine Clearance: 117.2 mL/min (by C-G formula based on SCr of 0.52 mg/dL). Liver Function Tests:  Recent Labs Lab 10/24/16 1455 10/25/16 0410  AST 31 23  ALT 28 25  ALKPHOS 97 84  BILITOT 0.6 0.8  PROT 7.1 6.0*  ALBUMIN 3.4* 2.9*   No results for input(s): LIPASE, AMYLASE in the last 168 hours. No results for input(s): AMMONIA in the last 168 hours. Coagulation Profile:  Recent Labs Lab 10/24/16 1455  INR 0.95   Cardiac Enzymes: No results for input(s): CKTOTAL, CKMB,  CKMBINDEX, TROPONINI in the last 168 hours. BNP (last 3 results) No results for input(s): PROBNP in the last 8760 hours. HbA1C: No results for input(s): HGBA1C in the last 72 hours. CBG: No results for input(s): GLUCAP in the last 168 hours. Lipid Profile: No results for input(s): CHOL, HDL, LDLCALC, TRIG, CHOLHDL, LDLDIRECT in the last 72 hours. Thyroid Function Tests:  Recent Labs  10/25/16 0410  TSH 1.902   Anemia Panel: No results for input(s): VITAMINB12, FOLATE, FERRITIN, TIBC, IRON, RETICCTPCT in the last 72 hours. Sepsis Labs:  Recent Labs Lab 10/24/16 1914  LATICACIDVEN 1.9    Recent Results (from the past 240 hour(s))  Culture, blood (Routine x 2)     Status: None (Preliminary result)   Collection Time: 10/24/16  2:15 PM  Result Value Ref Range Status   Specimen Description BLOOD RIGHT HAND  Final   Special Requests   Final    BOTTLES DRAWN AEROBIC AND ANAEROBIC Blood Culture adequate volume   Culture   Final    NO GROWTH 1 DAY Performed at [facility], [address], [place], [place] [address]    Report Status PENDING  Incomplete  Culture, blood (Routine x 2)     Status: None (Preliminary result)   Collection Time: 10/24/16  2:55 PM  Result Value Ref Range Status   Specimen Description BLOOD LEFT ANTECUBITAL  Final   Special Requests   Final    BOTTLES DRAWN AEROBIC AND ANAEROBIC Blood Culture adequate volume   Culture   Final    NO GROWTH 1 DAY Performed at [facility], [place] [address], [place], [place] [address]    Report Status PENDING  Incomplete         Radiology Studies: Dg Chest 2 View  Result Date: 10/24/2016 CLINICAL DATA:  Pain at Port-A-Cath site.  Left breast cancer. EXAM: CHEST  2 VIEW COMPARISON:  08/26/2016 FINDINGS: Right-sided Port-A-Cath is unchanged in position, with tip projecting over the mid SVC. Minimal right hemidiaphragm elevation. Midline trachea. Normal heart size and mediastinal contours. No pleural  effusion or pneumothorax. Mildly low lung volumes. Clear lungs. IMPRESSION: No acute cardiopulmonary disease. Electronically Signed   By: Abigail Miyamoto M.D.   On: 10/24/2016 15:26        Scheduled Meds: . docusate sodium  200 mg Oral BID  . sodium chloride flush  3 mL Intravenous Q12H  . venlafaxine XR  37.5 mg Oral Q breakfast   Continuous Infusions: .  ceFAZolin (ANCEF) IV       LOS:  2 days        Georgette Shell, MD [facility]   If 7PM-7AM, please contact night-coverage [email or web address] Password [facility] 10/26/2016, 12:32 PM

## 2016-10-26 NOTE — Progress Notes (Signed)
[name]   DOB:[birth year]   HW#:[id number]   RXY#:[id number]  Subjective:  Ronni started having pain and some redness associated with her port 10/19; this got worse over the next 24 hours and she was evaluated in the ED 10/20 with no fever but and elevated lactic acid and she was admitted for further treatent and evaluation. Cultures were obtained, negative so far, and she was started on vanco and cefepime, switched to cefazolin today.  She is feeling "much better, really well," and is anxous to go home. Specifically no tenderness or discharge from port at this time   Objective: young White woman examined inbed Vitals:   10/25/16 2046 10/26/16 0655  BP: 117/75 (!) 95/59  Pulse: 100 82  Resp: 20 20  Temp: 98.8 F (37.1 C) 98.5 F (36.9 C)  SpO2: 99% 99%    Body mass index is 40.55 kg/m.  Intake/Output Summary (Last 24 hours) at 10/26/16 1325 Last data filed at 10/26/16 0317  Gross per 24 hour  Intake              543 ml  Output                0 ml  Net              543 ml     Sclerae unicteric  No cervical or supraclavicular adenopathy  Lungs no rales or wheezes--auscultated anterolaterally  Heart regular rate and rhythm  Abdomen soft, +BS  Neuro nonfocal  Breast exam: I do not feel a mass in the left breast  Skin: port site intact, no erythema or swelling, no tenderness CBG (last 3)  No results for input(s): GLUCAP in the last 72 hours.   Labs:  Lab Results  Component Value Date   WBC 3.2 (L) 10/26/2016   HGB 8.4 (L) 10/26/2016   HCT 25.3 (L) 10/26/2016   MCV 92.3 10/26/2016   PLT 93 (L) 10/26/2016   NEUTROABS 2.3 10/26/2016    @LASTCHEMISTRY @  Urine Studies No results for input(s): UHGB, CRYS in the last 72 hours.  Invalid input(s): UACOL, UAPR, USPG, UPH, UTP, UGL, UKET, UBIL, UNIT, UROB, ULEU, UEPI, UWBC, URBC, UBAC, CAST, [place], [place]  Basic Metabolic Panel:  Recent Labs Lab 10/24/16 1455 10/24/16 2203 10/25/16 0410 10/26/16 0409  NA 140  --   140 140  K 3.1*  --  3.9 3.7  CL 103  --  107 105  CO2 29  --  27 27  GLUCOSE 132*  --  102* 103*  BUN 5*  --  <5* 5*  CREATININE 0.54  --  0.51 0.52  CALCIUM 9.1  --  8.5* 8.7*  MG  --  1.4* 1.6*  --   PHOS  --   --  3.6  --    GFR Estimated Creatinine Clearance: 117.2 mL/min (by C-G formula based on SCr of 0.52 mg/dL). Liver Function Tests:  Recent Labs Lab 10/24/16 1455 10/25/16 0410  AST 31 23  ALT 28 25  ALKPHOS 97 84  BILITOT 0.6 0.8  PROT 7.1 6.0*  ALBUMIN 3.4* 2.9*   No results for input(s): LIPASE, AMYLASE in the last 168 hours. No results for input(s): AMMONIA in the last 168 hours. Coagulation profile  Recent Labs Lab 10/24/16 1455  INR 0.95    CBC:  Recent Labs Lab 10/24/16 1455 10/25/16 0410 10/26/16 0409  WBC 5.0 4.4 3.2*  NEUTROABS 3.9  --  2.3  HGB 9.6* 8.4* 8.4*  HCT  29.2* 25.2* 25.3*  MCV 93.9 93.7 92.3  PLT 98* 97* 93*   Cardiac Enzymes: No results for input(s): CKTOTAL, CKMB, CKMBINDEX, TROPONINI in the last 168 hours. BNP: Invalid input(s): POCBNP CBG: No results for input(s): GLUCAP in the last 168 hours. D-Dimer No results for input(s): DDIMER in the last 72 hours. Hgb A1c No results for input(s): HGBA1C in the last 72 hours. Lipid Profile No results for input(s): CHOL, HDL, LDLCALC, TRIG, CHOLHDL, LDLDIRECT in the last 72 hours. Thyroid function studies  Recent Labs  10/25/16 0410  TSH 1.902   Anemia work up No results for input(s): VITAMINB12, FOLATE, FERRITIN, TIBC, IRON, RETICCTPCT in the last 72 hours. Microbiology Recent Results (from the past 240 hour(s))  Culture, blood (Routine x 2)     Status: None (Preliminary result)   Collection Time: 10/24/16  2:15 PM  Result Value Ref Range Status   Specimen Description BLOOD RIGHT HAND  Final   Special Requests   Final    BOTTLES DRAWN AEROBIC AND ANAEROBIC Blood Culture adequate volume   Culture   Final    NO GROWTH 1 DAY Performed at [facility], [address], [place], [place] [address]    Report Status PENDING  Incomplete  Culture, blood (Routine x 2)     Status: None (Preliminary result)   Collection Time: 10/24/16  2:55 PM  Result Value Ref Range Status   Specimen Description BLOOD LEFT ANTECUBITAL  Final   Special Requests   Final    BOTTLES DRAWN AEROBIC AND ANAEROBIC Blood Culture adequate volume   Culture   Final    NO GROWTH 1 DAY Performed at [facility], [place] [address], [place], [place] [address]    Report Status PENDING  Incomplete      Studies:  Dg Chest 2 View  Result Date: 10/24/2016 CLINICAL DATA:  Pain at Port-A-Cath site.  Left breast cancer. EXAM: CHEST  2 VIEW COMPARISON:  08/26/2016 FINDINGS: Right-sided Port-A-Cath is unchanged in position, with tip projecting over the mid SVC. Minimal right hemidiaphragm elevation. Midline trachea. Normal heart size and mediastinal contours. No pleural effusion or pneumothorax. Mildly low lung volumes. Clear lungs. IMPRESSION: No acute cardiopulmonary disease. Electronically Signed   By: Abigail Miyamoto M.D.   On: 10/24/2016 15:26    Assessment: 46 y.o.  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 Additional testing of the patient's mother and maternal aunt is pending. Otherwise [facility]'s Common Hereditary Cancers Panel found no deleterious mutations in APC, ATM, AXIN2, BARD1, BMPR1A, BRCA1, BRCA2, BRIP1, CDH1, CDKN2A, CHEK2, CTNNA1, DICER1, EPCAM, GREM1, HOXB13, KIT, MEN1, MLH1, MSH2, MSH3, MSH6, MUTYH, NBN, NF1, NTHL1, PALB2, PDGFRA, PMS2, POLD1, POLE, PTEN, RAD50, RAD51C, RAD51D, SDHA,  SDHB, SDHC, SDHD, SMAD4, SMARCA4, STK11, TP53, TSC1, TSC2, and VHL.  (2) neoadjuvant chemotherapy to consist of doxorubicin and cyclophosphamide in dose dense fashion 4 completed 09/10/2016 to be followed by paclitaxel weekly 12 given with carboplatin              (a) cycle 4 of cyclophosphamide and doxorubicin was delayed 10 days and dose decreased 10% because of febrile neutropenia after cycle  3  (3) definitive surgery to follow  (4) adjuvant radiation to follow surgery    Plan:  Annarae is now 15 days out from her most recent chemo, is not neutropenic, and vitals are stable. Port associated cellulitis clinically resolved.  I would discharge to home on keflex or similar for an additional 5 days, which will give [place] time to follow up on final culture results. She already has an appt with [place] 10/29  Please let me know if I can be of further help   Chauncey Cruel, MD 10/26/2016  1:25 PM Medical Oncology and Hematology [facility] [place], [place] [address] Tel. [phone]    Fax. [phone]

## 2016-10-26 NOTE — Telephone Encounter (Signed)
Spoke with patient concerning upcoming appointment. Per 10/22 sch mesage

## 2016-10-26 NOTE — Progress Notes (Signed)
Patient ID: [name], female   DOB: 11-Nov-1970, 46 y.o.   MRN: [id number] Pt's right chest port site looks and feels much better today per pt. There is sig less erythema at site and no evidence of drainage/skin breakdown/fluctuance; minimal tenderness. She is afebrile. Blood cultures pend. WBC 3.2, hgb stable, plts 93k. . Would cont to observe on antbx for now and await final culture results.

## 2016-10-26 NOTE — Discharge Summary (Signed)
Physician Discharge Summary  [name] VOZ:[id number] DOB: 08-Aug-1970 DOA: 10/24/2016  PCP: [name], MD  Admit date: 10/24/2016 Discharge date: 10/26/2016  Admitted Fromhome :Disposition:  home Recommendations for Outpatient Follow-up:  1. Follow up with PCP in 1-2 weeks 2. Please obtain BMP/CBC in one week Home Health none Equipment/Devices:none  Discharge Condition: Stable CODE STATUS: Full code Diet recommendation: Regular diet  Brief/Interim Summary:: 46 year old female with a history of triple negative breast cancer with a port in the right upper chest admitted with fever and swelling and redness at the site of the port she is being admitted for cellulitis and was given vancomycin and cefepime in the ER. Blood cultures have been drawn which are pending at this time patient is currently on chemotherapy patient has had the port placed on July 10 of 2018 by interventional radiology. She does complain of constipation today. She was also being given IV fluids she is reports that her body swelling up from fluids. She denies any nausea or vomiting she does complain of constipation no chest pain shortness of breath .  Active Problems:   Essential hypertension   Malignant neoplasm of upper-outer quadrant of left breast in female, estrogen receptor negative (HCC)   Hypokalemia   Cellulitis  Cellulitis porta cath site -decraese erythema,tenderness.blood cx pending from the periphery.  ER NEG BREAST CA  Discharge Instructions follow up with onc   Allergies as of 10/26/2016   No Known Allergies     Medication List    TAKE these medications   acetaminophen 500 MG tablet Commonly known as:  TYLENOL Take 2 tablets (1,000 mg total) by mouth every 6 (six) hours as needed for mild pain. For fever/pain   cephALEXin 500 MG capsule Commonly known as:  KEFLEX Take 1 capsule (500 mg total) by mouth 4 (four) times daily.   docusate sodium 100 MG capsule Commonly  known as:  COLACE Take 1 capsule (100 mg total) by mouth 2 (two) times daily. What changed:  how much to take   lidocaine-prilocaine cream Commonly known as:  EMLA Apply to affected area once   lidocaine-prilocaine cream Commonly known as:  EMLA Apply to affected area once   loratadine 10 MG tablet Commonly known as:  CLARITIN Take 10 mg by mouth daily. On chemo days   prochlorperazine 10 MG tablet Commonly known as:  COMPAZINE Take 10 mg by mouth every 6 (six) hours as needed for nausea or vomiting. On day of and three days after chemo   traZODone 50 MG tablet Commonly known as:  DESYREL Take 1 tablet (50 mg total) by mouth at bedtime as needed for sleep. Take 1 tab x 2 nights, then ok to increase to two tab QHS PRN What changed:  how much to take  additional instructions   venlafaxine XR 37.5 MG 24 hr capsule Commonly known as:  EFFEXOR-XR Take 1 capsule (37.5 mg total) by mouth daily with breakfast.       No Known Allergies  Consultations:  onc   Procedures/Studies: Dg Chest 2 View  Result Date: 10/24/2016 CLINICAL DATA:  Pain at Port-A-Cath site.  Left breast cancer. EXAM: CHEST  2 VIEW COMPARISON:  08/26/2016 FINDINGS: Right-sided Port-A-Cath is unchanged in position, with tip projecting over the mid SVC. Minimal right hemidiaphragm elevation. Midline trachea. Normal heart size and mediastinal contours. No pleural effusion or pneumothorax. Mildly low lung volumes. Clear lungs. IMPRESSION: No acute cardiopulmonary disease. Electronically Signed   By: Adria Devon.D.  On: 10/24/2016 15:26    (Echo, Carotid, EGD, Colonoscopy, ERCP)    Subjective:   Discharge Exam: Vitals:   10/26/16 0655 10/26/16 1428  BP: (!) 95/59 104/62  Pulse: 82 89  Resp: 20 18  Temp: 98.5 F (36.9 C) 98.4 F (36.9 C)  SpO2: 99% 98%   Vitals:   10/25/16 2046 10/26/16 0625 10/26/16 0655 10/26/16 1428  BP: 117/75  (!) 95/59 104/62  Pulse: 100  82 89  Resp: 20  20 18    Temp: 98.8 F (37.1 C)  98.5 F (36.9 C) 98.4 F (36.9 C)  TempSrc: Oral  Oral Oral  SpO2: 99%  99% 98%  Weight:   119.2 kg (262 lb 12.8 oz)   Height:  5' 7.5" (1.715 m)      General: Pt is alert, awake, not in acute distress Cardiovascular: RRR, S1/S2 +, no rubs, no gallops Respiratory: CTA bilaterally, no wheezing, no rhonchi Abdominal: Soft, NT, ND, bowel sounds + Extremities: no edema, no cyanosis    The results of significant diagnostics from this hospitalization (including imaging, microbiology, ancillary and laboratory) are listed below for reference.     Microbiology: Recent Results (from the past 240 hour(s))  Culture, blood (Routine x 2)     Status: None (Preliminary result)   Collection Time: 10/24/16  2:15 PM  Result Value Ref Range Status   Specimen Description BLOOD RIGHT HAND  Final   Special Requests   Final    BOTTLES DRAWN AEROBIC AND ANAEROBIC Blood Culture adequate volume   Culture   Final    NO GROWTH 1 DAY Performed at [facility], [address] [place], [place] [address]    Report Status PENDING  Incomplete  Culture, blood (Routine x 2)     Status: None (Preliminary result)   Collection Time: 10/24/16  2:55 PM  Result Value Ref Range Status   Specimen Description BLOOD LEFT ANTECUBITAL  Final   Special Requests   Final    BOTTLES DRAWN AEROBIC AND ANAEROBIC Blood Culture adequate volume   Culture   Final    NO GROWTH 1 DAY Performed at [facility], [place] [address] [place], [place] [address]    Report Status PENDING  Incomplete     Labs: BNP (last 3 results)  Recent Labs  08/27/16 0221  BNP 85.0   Basic Metabolic Panel:  Recent Labs Lab 10/24/16 1455 10/24/16 2203 10/25/16 0410 10/26/16 0409  NA 140  --  140 140  K 3.1*  --  3.9 3.7  CL 103  --  107 105  CO2 29  --  27 27  GLUCOSE 132*  --  102* 103*  BUN 5*  --  <5* 5*  CREATININE 0.54  --  0.51 0.52  CALCIUM 9.1  --  8.5* 8.7*  MG  --  1.4* 1.6*  --    PHOS  --   --  3.6  --    Liver Function Tests:  Recent Labs Lab 10/24/16 1455 10/25/16 0410  AST 31 23  ALT 28 25  ALKPHOS 97 84  BILITOT 0.6 0.8  PROT 7.1 6.0*  ALBUMIN 3.4* 2.9*   No results for input(s): LIPASE, AMYLASE in the last 168 hours. No results for input(s): AMMONIA in the last 168 hours. CBC:  Recent Labs Lab 10/24/16 1455 10/25/16 0410 10/26/16 0409  WBC 5.0 4.4 3.2*  NEUTROABS 3.9  --  2.3  HGB 9.6* 8.4* 8.4*  HCT 29.2* 25.2*  25.3*  MCV 93.9 93.7 92.3  PLT 98* 97* 93*   Cardiac Enzymes: No results for input(s): CKTOTAL, CKMB, CKMBINDEX, TROPONINI in the last 168 hours. BNP: Invalid input(s): POCBNP CBG: No results for input(s): GLUCAP in the last 168 hours. D-Dimer No results for input(s): DDIMER in the last 72 hours. Hgb A1c No results for input(s): HGBA1C in the last 72 hours. Lipid Profile No results for input(s): CHOL, HDL, LDLCALC, TRIG, CHOLHDL, LDLDIRECT in the last 72 hours. Thyroid function studies  Recent Labs  10/25/16 0410  TSH 1.902   Anemia work up No results for input(s): VITAMINB12, FOLATE, FERRITIN, TIBC, IRON, RETICCTPCT in the last 72 hours. Urinalysis    Component Value Date/Time   COLORURINE YELLOW 10/24/2016 1621   APPEARANCEUR CLEAR 10/24/2016 1621   LABSPEC >1.030 (H) 10/24/2016 1621   PHURINE 5.5 10/24/2016 1621   GLUCOSEU NEGATIVE 10/24/2016 1621   HGBUR NEGATIVE 10/24/2016 1621   BILIRUBINUR NEGATIVE 10/24/2016 1621   KETONESUR NEGATIVE 10/24/2016 1621   PROTEINUR NEGATIVE 10/24/2016 1621   UROBILINOGEN 0.2 01/04/2010 1911   NITRITE NEGATIVE 10/24/2016 1621   LEUKOCYTESUR NEGATIVE 10/24/2016 1621   Sepsis Labs Invalid input(s): PROCALCITONIN,  WBC,  LACTICIDVEN Microbiology Recent Results (from the past 240 hour(s))  Culture, blood (Routine x 2)     Status: None (Preliminary result)   Collection Time: 10/24/16  2:15 PM  Result Value Ref Range Status   Specimen Description BLOOD RIGHT HAND  Final    Special Requests   Final    BOTTLES DRAWN AEROBIC AND ANAEROBIC Blood Culture adequate volume   Culture   Final    NO GROWTH 1 DAY Performed at [facility], [place] [address] [place], [place] [address]    Report Status PENDING  Incomplete  Culture, blood (Routine x 2)     Status: None (Preliminary result)   Collection Time: 10/24/16  2:55 PM  Result Value Ref Range Status   Specimen Description BLOOD LEFT ANTECUBITAL  Final   Special Requests   Final    BOTTLES DRAWN AEROBIC AND ANAEROBIC Blood Culture adequate volume   Culture   Final    NO GROWTH 1 DAY Performed at [facility], [place] [address], [place], [place] [address]    Report Status PENDING  Incomplete     Time coordinating discharge: Over 30 minutes  SIGNED:   Georgette Shell, MD  [facility] 10/26/2016, 2:35 PM   If 7PM-7AM, please contact night-coverage [email or web address] Password [email or web address]

## 2016-10-26 NOTE — Progress Notes (Signed)
Patient discharged to home, all discharge medications and instructions reviewed and questions answered.  Patient to be assisted to vehicle by wheelchair.  

## 2016-10-26 NOTE — Progress Notes (Signed)
No show-patient was admitted--see admission note

## 2016-10-28 ENCOUNTER — Ambulatory Visit (HOSPITAL_COMMUNITY)
Admission: RE | Admit: 2016-10-28 | Discharge: 2016-10-28 | Disposition: A | Discharge status: Home / Self Care | Payer: Medicaid Other | Source: Ambulatory Visit | Attending: Oncology | Admitting: Oncology

## 2016-10-28 ENCOUNTER — Ambulatory Visit (HOSPITAL_BASED_OUTPATIENT_CLINIC_OR_DEPARTMENT_OTHER)
Admission: RE | Admit: 2016-10-28 | Discharge: 2016-10-28 | Disposition: A | Discharge status: Home / Self Care | Payer: Medicaid Other | Source: Ambulatory Visit | Attending: Cardiology | Admitting: Cardiology

## 2016-10-28 ENCOUNTER — Encounter (HOSPITAL_COMMUNITY): Payer: Self-pay | Admitting: Cardiology

## 2016-10-28 VITALS — BP 140/78 | HR 98 | Wt 264.0 lb

## 2016-10-28 DIAGNOSIS — Z803 Family history of malignant neoplasm of breast: Secondary | ICD-10-CM | POA: Diagnosis not present

## 2016-10-28 DIAGNOSIS — I10 Essential (primary) hypertension: Secondary | ICD-10-CM | POA: Insufficient documentation

## 2016-10-28 DIAGNOSIS — Z171 Estrogen receptor negative status [ER-]: Secondary | ICD-10-CM | POA: Insufficient documentation

## 2016-10-28 DIAGNOSIS — Z9221 Personal history of antineoplastic chemotherapy: Secondary | ICD-10-CM | POA: Diagnosis not present

## 2016-10-28 DIAGNOSIS — Z79899 Other long term (current) drug therapy: Secondary | ICD-10-CM | POA: Insufficient documentation

## 2016-10-28 DIAGNOSIS — C50412 Malignant neoplasm of upper-outer quadrant of left female breast: Secondary | ICD-10-CM

## 2016-10-28 NOTE — Progress Notes (Signed)
Advanced Heart Failure Medication Review by a Pharmacist  Does the patient  feel that his/her medications are working for him/her?  yes  Has the patient been experiencing any side effects to the medications prescribed?  no  Does the patient measure his/her own blood pressure or blood glucose at home?  no   Does the patient have any problems obtaining medications due to transportation or finances?   no  Understanding of regimen: good Understanding of indications: good Potential of compliance: good Patient understands to avoid NSAIDs. Patient understands to avoid decongestants.  Issues to address at subsequent visits: None   Pharmacist comments: [name] is a pleasant 46 yo F presenting without a medication list but with good recall of her regimen. She reports good compliance with her regimen and did not have any specific medication-related questions or concerns for me at this time.   Ruta Hinds. Velva Harman, PharmD, BCPS, CPP Clinical Pharmacist Pager: [phone] Phone: [phone] 10/28/2016 2:09 PM      Time with patient: 10 minutes Preparation and documentation time: 2 minutes Total time: 12 minutes

## 2016-10-28 NOTE — Progress Notes (Signed)
  Echocardiogram 2D Echocardiogram has been performed.  [name] 10/28/2016, 1:38 PM

## 2016-10-28 NOTE — Patient Instructions (Signed)
Your physician has requested that you have an echocardiogram. Echocardiography is a painless test that uses sound waves to create images of your heart. It provides your doctor with information about the size and shape of your heart and how well your heart's chambers and valves are working. This procedure takes approximately one hour. There are no restrictions for this procedure.  Your physician recommends that you schedule a follow-up appointment in: 6 months.  

## 2016-10-29 ENCOUNTER — Ambulatory Visit: Payer: Medicaid Other | Admitting: Internal Medicine

## 2016-10-29 NOTE — Progress Notes (Signed)
Oncology: [name]  46 yo with breast cancer was referred by [name] for cardio-oncology evaluation.  Breast cancer was diagnosed in 5/18 on left with positive axillary nodes.  ER-/PR-/HER2-.  4 cycles doxorubicin/cyclophosphamide completed 09/10/16.  Getting 12 cycles paclitaxel/carboplatin.  Will then have surgery and radiation.    She has no prior cardiac history. She does not smoke.  She has mild dyspnea after walking up a flight of steps. No dyspnea walking on flat ground.  No chest pain.   PMH: 1. Breast cancer: Diagnosed in 5/18 on left with positive axillary nodes.  ER-/PR-/HER2-.  4 cycles doxorubicin/cyclophosphamide completed 09/10/16.  Getting 12 cycles paclitaxel/carboplatin.  Will then have surgery and radiation.  - Echo (7/18): EF 60-65% - Echo (10/18): EF 60-65%, GLS -20.4%  Social History   Social History  . Marital status: Married    Spouse name: N/A  . Number of children: N/A  . Years of education: N/A   Occupational History  . Not on file.   Social History Main Topics  . Smoking status: Never Smoker  . Smokeless tobacco: Never Used  . Alcohol use No  . Drug use: No  . Sexual activity: Not on file   Other Topics Concern  . Not on file   Social History Narrative  . No narrative on file   Family History  Problem Relation Age of Onset  . Diabetes Mother   . Hypertension Mother   . Arthritis Mother   . Colon cancer Father 64       d.76 metastatic at time of diagnosis  . Diabetes Father   . Heart disease Father   . Hypertension Father   . Breast cancer Maternal Grandmother 63       d.60s  . Breast cancer Maternal Aunt 52  . Cervical cancer Maternal Aunt 77  . Breast cancer Maternal Aunt 46       d.50s  . Cancer Maternal Uncle        d.62s unspecified type of cancer  . Breast cancer Cousin 38       paternal first-cousin (daughter of unaffected aunt)  . Cancer Maternal Aunt 44       "Female Cancer"  . Cancer Maternal Aunt        unknown  cancer   ROS: All systems reviewed and negative except as per HPI.  Current Outpatient Prescriptions  Medication Sig Dispense Refill  . cephALEXin (KEFLEX) 500 MG capsule Take 1 capsule (500 mg total) by mouth 4 (four) times daily. 40 capsule 0  . lidocaine-prilocaine (EMLA) cream Apply to affected area once 30 g 3  . loratadine (CLARITIN) 10 MG tablet Take 10 mg by mouth daily. On chemo days    . prochlorperazine (COMPAZINE) 10 MG tablet Take 10 mg by mouth every 6 (six) hours as needed for nausea or vomiting. On day of and three days after chemo    . traZODone (DESYREL) 50 MG tablet Take 100 mg by mouth at bedtime as needed for sleep.    Marland Kitchen acetaminophen (TYLENOL) 500 MG tablet Take 2 tablets (1,000 mg total) by mouth every 6 (six) hours as needed for mild pain. For fever/pain (Patient not taking: Reported on 10/24/2016)    . docusate sodium (COLACE) 100 MG capsule Take 1 capsule (100 mg total) by mouth 2 (two) times daily. (Patient not taking: Reported on 10/28/2016) 60 capsule 2   No current facility-administered medications for this encounter.    BP 140/78   Pulse 98  Wt 264 lb (119.7 kg)   SpO2 100%   BMI 40.74 kg/m  General: NAD Neck: No JVD, no thyromegaly or thyroid nodule.  Lungs: Clear to auscultation bilaterally with normal respiratory effort. CV: Nondisplaced PMI.  Heart regular S1/S2, no S3/S4, no murmur.  No peripheral edema.  No carotid bruit.  Normal pedal pulses.  Abdomen: Soft, nontender, no hepatosplenomegaly, no distention.  Skin: Intact without lesions or rashes.  Neurologic: Alert and oriented x 3.  Psych: Normal affect. Extremities: No clubbing or cyanosis.  HEENT: Normal.   Assessment/Plan: 46 yo with triple negative breast cancer presents for cardio-oncology evaluation.  She had 4 cycles doxorubicin-based chemo ending in 9/18.  We discussed the cardiac risk of doxorubicin and the rationale behind echo screening for cardiotoxicity.  Echo in 7/18 was normal  (prior to treatment).  I reviewed today's echo: EF and strain pattern remains normal.   - She will followup with echo in 6 months to look for any signs of late toxicity from doxorubicin.   Loralie Champagne 10/29/2016

## 2016-10-30 LAB — CULTURE, BLOOD (ROUTINE X 2)
CULTURE: NO GROWTH
Culture: NO GROWTH
SPECIAL REQUESTS: ADEQUATE
Special Requests: ADEQUATE

## 2016-11-02 ENCOUNTER — Ambulatory Visit (HOSPITAL_BASED_OUTPATIENT_CLINIC_OR_DEPARTMENT_OTHER): Payer: Medicaid Other | Admitting: Adult Health

## 2016-11-02 ENCOUNTER — Telehealth: Payer: Self-pay | Admitting: Oncology

## 2016-11-02 ENCOUNTER — Encounter: Payer: Self-pay | Admitting: Adult Health

## 2016-11-02 ENCOUNTER — Other Ambulatory Visit (HOSPITAL_BASED_OUTPATIENT_CLINIC_OR_DEPARTMENT_OTHER): Payer: Medicaid Other

## 2016-11-02 ENCOUNTER — Ambulatory Visit (HOSPITAL_BASED_OUTPATIENT_CLINIC_OR_DEPARTMENT_OTHER): Payer: Medicaid Other

## 2016-11-02 ENCOUNTER — Ambulatory Visit: Payer: Medicaid Other

## 2016-11-02 VITALS — BP 133/78 | HR 92 | Temp 98.3°F | Resp 18 | Ht 67.5 in | Wt 261.0 lb

## 2016-11-02 DIAGNOSIS — Z171 Estrogen receptor negative status [ER-]: Principal | ICD-10-CM

## 2016-11-02 DIAGNOSIS — C50412 Malignant neoplasm of upper-outer quadrant of left female breast: Secondary | ICD-10-CM

## 2016-11-02 DIAGNOSIS — C773 Secondary and unspecified malignant neoplasm of axilla and upper limb lymph nodes: Secondary | ICD-10-CM | POA: Diagnosis not present

## 2016-11-02 DIAGNOSIS — Z5111 Encounter for antineoplastic chemotherapy: Secondary | ICD-10-CM | POA: Diagnosis present

## 2016-11-02 DIAGNOSIS — Z95828 Presence of other vascular implants and grafts: Secondary | ICD-10-CM | POA: Insufficient documentation

## 2016-11-02 LAB — COMPREHENSIVE METABOLIC PANEL
ALBUMIN: 3.4 g/dL — AB (ref 3.5–5.0)
ALK PHOS: 82 U/L (ref 40–150)
ALT: 32 U/L (ref 0–55)
ANION GAP: 10 meq/L (ref 3–11)
AST: 28 U/L (ref 5–34)
BILIRUBIN TOTAL: 0.9 mg/dL (ref 0.20–1.20)
BUN: 5.2 mg/dL — ABNORMAL LOW (ref 7.0–26.0)
CO2: 25 mEq/L (ref 22–29)
Calcium: 9.3 mg/dL (ref 8.4–10.4)
Chloride: 105 mEq/L (ref 98–109)
Creatinine: 0.7 mg/dL (ref 0.6–1.1)
Glucose: 114 mg/dl (ref 70–140)
Potassium: 3.5 mEq/L (ref 3.5–5.1)
Sodium: 140 mEq/L (ref 136–145)
TOTAL PROTEIN: 6.5 g/dL (ref 6.4–8.3)

## 2016-11-02 LAB — CBC WITH DIFFERENTIAL/PLATELET
BASO%: 0.5 % (ref 0.0–2.0)
Basophils Absolute: 0 10*3/uL (ref 0.0–0.1)
EOS%: 1.1 % (ref 0.0–7.0)
Eosinophils Absolute: 0 10*3/uL (ref 0.0–0.5)
HEMATOCRIT: 28.9 % — AB (ref 34.8–46.6)
HEMOGLOBIN: 9.7 g/dL — AB (ref 11.6–15.9)
LYMPH#: 0.6 10*3/uL — AB (ref 0.9–3.3)
LYMPH%: 18.8 % (ref 14.0–49.7)
MCH: 31.8 pg (ref 25.1–34.0)
MCHC: 33.7 g/dL (ref 31.5–36.0)
MCV: 94.4 fL (ref 79.5–101.0)
MONO#: 0.3 10*3/uL (ref 0.1–0.9)
MONO%: 8.4 % (ref 0.0–14.0)
NEUT#: 2.3 10*3/uL (ref 1.5–6.5)
NEUT%: 71.2 % (ref 38.4–76.8)
Platelets: 172 10*3/uL (ref 145–400)
RBC: 3.06 10*6/uL — ABNORMAL LOW (ref 3.70–5.45)
RDW: 19.9 % — AB (ref 11.2–14.5)
WBC: 3.2 10*3/uL — ABNORMAL LOW (ref 3.9–10.3)

## 2016-11-02 MED ORDER — SODIUM CHLORIDE 0.9 % IV SOLN
Freq: Once | INTRAVENOUS | Status: AC
  Administered 2016-11-02: 16:00:00 via INTRAVENOUS

## 2016-11-02 MED ORDER — FAMOTIDINE IN NACL 20-0.9 MG/50ML-% IV SOLN
INTRAVENOUS | Status: AC
  Filled 2016-11-02: qty 50

## 2016-11-02 MED ORDER — SODIUM CHLORIDE 0.9% FLUSH
10.0000 mL | INTRAVENOUS | Status: DC | PRN
  Administered 2016-11-02: 10 mL
  Filled 2016-11-02: qty 10

## 2016-11-02 MED ORDER — PALONOSETRON HCL INJECTION 0.25 MG/5ML
0.2500 mg | Freq: Once | INTRAVENOUS | Status: AC
  Administered 2016-11-02: 0.25 mg via INTRAVENOUS

## 2016-11-02 MED ORDER — SODIUM CHLORIDE 0.9 % IV SOLN
20.0000 mg | Freq: Once | INTRAVENOUS | Status: AC
  Administered 2016-11-02: 20 mg via INTRAVENOUS
  Filled 2016-11-02: qty 2

## 2016-11-02 MED ORDER — DIPHENHYDRAMINE HCL 50 MG/ML IJ SOLN
50.0000 mg | Freq: Once | INTRAMUSCULAR | Status: AC
  Administered 2016-11-02: 50 mg via INTRAVENOUS

## 2016-11-02 MED ORDER — PACLITAXEL CHEMO INJECTION 300 MG/50ML
80.0000 mg/m2 | Freq: Once | INTRAVENOUS | Status: AC
  Administered 2016-11-02: 186 mg via INTRAVENOUS
  Filled 2016-11-02: qty 31

## 2016-11-02 MED ORDER — SODIUM CHLORIDE 0.9 % IV SOLN
300.0000 mg | Freq: Once | INTRAVENOUS | Status: AC
  Administered 2016-11-02: 300 mg via INTRAVENOUS
  Filled 2016-11-02: qty 30

## 2016-11-02 MED ORDER — FAMOTIDINE IN NACL 20-0.9 MG/50ML-% IV SOLN
20.0000 mg | Freq: Once | INTRAVENOUS | Status: AC
  Administered 2016-11-02: 20 mg via INTRAVENOUS

## 2016-11-02 MED ORDER — DIPHENHYDRAMINE HCL 50 MG/ML IJ SOLN
INTRAMUSCULAR | Status: AC
  Filled 2016-11-02: qty 1

## 2016-11-02 MED ORDER — HEPARIN SOD (PORK) LOCK FLUSH 100 UNIT/ML IV SOLN
500.0000 [IU] | Freq: Once | INTRAVENOUS | Status: AC | PRN
  Administered 2016-11-02: 500 [IU]
  Filled 2016-11-02: qty 5

## 2016-11-02 MED ORDER — SODIUM CHLORIDE 0.9% FLUSH
10.0000 mL | Freq: Once | INTRAVENOUS | Status: AC
  Administered 2016-11-02: 10 mL
  Filled 2016-11-02: qty 10

## 2016-11-02 MED ORDER — PALONOSETRON HCL INJECTION 0.25 MG/5ML
INTRAVENOUS | Status: AC
  Filled 2016-11-02: qty 5

## 2016-11-02 NOTE — Telephone Encounter (Signed)
Scheduled appt per 10/29 los. Patient did not want avs or calendar.

## 2016-11-02 NOTE — Patient Instructions (Signed)
   [facility] Discharge Instructions for Patients Receiving Chemotherapy  Today you received the following chemotherapy agents Taxol and Carboplatin   To help prevent nausea and vomiting after your treatment, we encourage you to take your nausea medication as directed.    If you develop nausea and vomiting that is not controlled by your nausea medication, call the clinic.   BELOW ARE SYMPTOMS THAT SHOULD BE REPORTED IMMEDIATELY:  *FEVER GREATER THAN 100.5 F  *CHILLS WITH OR WITHOUT FEVER  NAUSEA AND VOMITING THAT IS NOT CONTROLLED WITH YOUR NAUSEA MEDICATION  *UNUSUAL SHORTNESS OF BREATH  *UNUSUAL BRUISING OR BLEEDING  TENDERNESS IN MOUTH AND THROAT WITH OR WITHOUT PRESENCE OF ULCERS  *URINARY PROBLEMS  *BOWEL PROBLEMS  UNUSUAL RASH Items with * indicate a potential emergency and should be followed up as soon as possible.  Feel free to call the clinic should you have any questions or concerns. The clinic phone number is [phone].  Please show the CHEMO ALERT CARD at check-in to the Emergency Department and triage nurse.   

## 2016-11-02 NOTE — Patient Instructions (Signed)

## 2016-11-02 NOTE — Progress Notes (Signed)
[facility]  Telephone:([phone]) [phone] Fax:(336) [phone]     ID: [name] DOB: [birth year]  MR#: [id number]  HAL#:[id number]  Patient Care Team: [name], MD as PCP - General (Internal Medicine) [name], MD as Consulting Physician (General Surgery) Magrinat, [name], MD as Consulting Physician (Oncology) [name], MD as Attending Physician (Radiation Oncology) [name], NP OTHER MD:  CHIEF COMPLAINT: Triple negative breast cancer  CURRENT TREATMENT: Neoadjuvant chemotherapy  INTERVAL HISTORY: [name] returns today for follow-up and treatment of her triple negative breast cancer.  She receives Paclitaxel and Carboplatin on days 1, 8, 15, followed by Neulasta on Day 16. Every 28 days. She was recently hospitalized and discharged due to cellulitis over her port.  She was evaluated by IR.  Her port was not accessed during this time and she was treated with antibiotics successfully.  Her infection has resolved and she has completed antibiotics for this.    REVIEW OF SYSTEMS: [name] is feeling well today.  Her mood is good.  She didn't like how the Effexor made her feel so she stopped taking it.  She denies peripheral neuropathy.  She denies any other issues and a detailed ROS is otherwise non contributory.      BREAST CANCER HISTORY: From the original intake note:  The patient was seen in the emergency room 05/16/2016 with nonspecific complaints but also noting that she had a tender lump in her lateral left breast which she said she had noted the day before. Exam by the emergency room physician confirmed a 3 cm soft left lateral breast mass without overlying erythema or nipple changes. This was felt to be most consistent with fibrocystic change but the patient was referred back to her primary care physician for further evaluation. She saw the physician assistant in May 18 and then Dr. Wynetta Emery on 06/12/2016 who scheduled a bilateral diagnostic  mammography with tomography and left breast ultrasonography at the [facility] 06/18/2016. This found the breast density to be category B. In the left breast upper outer quadrant there was an area of asymmetry measuring up to 10.3 cm. On exam this was firm and palpable and on ultrasound there was an irregular mass measuring 2.5 cm with some subtle changes in the surrounding tissue. In the left lower axilla there were 2 suspicious-looking lymph nodes.  Biopsy of the left breast upper outer quadrant and one of the suspicious lymph nodes 06/19/2016 showed ([id number]) both to be involved by invasive ductal carcinoma, grade 3, estrogen and progesterone receptor negative, HER-2 not amplified with a signals ratio being 1.53-1.74 and the number per cell 2.35-3.05. The MIB-1 was 90%.  The patient's subsequent history is as detailed below.   PAST MEDICAL HISTORY: Past Medical History:  Diagnosis Date  . Breast cancer ([place])    lf breast  . Malignant neoplasm of upper-outer quadrant of left breast in female, estrogen receptor negative ([place]) 06/23/2016    PAST SURGICAL HISTORY: Past Surgical History:  Procedure Laterality Date  . ANKLE SURGERY    . IR FLUORO GUIDE PORT INSERTION RIGHT  07/14/2016  . IR US GUIDE VASC ACCESS RIGHT  07/14/2016  . TUBAL LIGATION      FAMILY HISTORY Family History  Problem Relation Age of Onset  . Diabetes Mother   . Hypertension Mother   . Arthritis Mother   . Colon cancer Father 34       d.76 metastatic at time of diagnosis  . Diabetes Father   .  Heart disease Father   . Hypertension Father   . Breast cancer Maternal Grandmother 71       d.60s  . Breast cancer Maternal Aunt 52  . Cervical cancer Maternal Aunt 77  . Breast cancer Maternal Aunt 46       d.50s  . Cancer Maternal Uncle        d.62s unspecified type of cancer  . Breast cancer Cousin 29       paternal first-cousin (daughter of unaffected aunt)  . Cancer Maternal Aunt 11       "Female Cancer"   . Cancer Maternal Aunt        unknown cancer  The patient's father died at age 54 from colon cancer which she never had treated. The patient's mother is living, 37 years old as of June 2018. The patient has one brother, one sister. On the mother's side there is a history of breast cancer in the grandmother, age 53, and then 1 aunt with breast cancer age 73, and another with lung cancer age 4. There is no history of ovarian cancer in the family.  GYNECOLOGIC HISTORY:  No LMP recorded. Menarche age 78, first live birth age 56. She is GX P4. Her periods have never been regular. She had a period in June 2018 but not for 4 months before that. She took birth control for some years remotely with no complications. She is status post bilateral tubal ligation.  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 Her husband [name] (goes by "[name]") is originally from [name]. The patient's children from her first marriage are [name] "[name]" [name] 47 years old (all ages as of June 2018) recently in the [employer] but now taking courses sent rocking him immunity college, and [name], who is studying psychology at [name]. Together with [name] the patient's children are [name] and [name], aged 59 and 65.     ADVANCED DIRECTIVES: Not in place   HEALTH MAINTENANCE: Social History  Substance Use Topics  . Smoking status: Never Smoker  . Smokeless tobacco: Never Used  . Alcohol use No     Colonoscopy:Never   LMB:EMLJ 2018  Bone density:Never   No Known Allergies  Current Outpatient Prescriptions  Medication Sig Dispense Refill  . cephALEXin (KEFLEX) 500 MG capsule Take 1 capsule (500 mg total) by mouth 4 (four) times daily. 40 capsule 0  . docusate sodium (COLACE) 100 MG capsule Take 1 capsule (100 mg total) by  mouth 2 (two) times daily. 60 capsule 2  . prochlorperazine (COMPAZINE) 10 MG tablet Take 10 mg by mouth every 6 (six) hours as needed for nausea or vomiting. On day of and three days after chemo    . traZODone (DESYREL) 50 MG tablet Take 100 mg by mouth at bedtime as needed for sleep.    Marland Kitchen acetaminophen (TYLENOL) 500 MG tablet Take 2 tablets (1,000 mg total) by mouth every 6 (six) hours as needed for mild pain. For fever/pain (Patient not taking: Reported on 10/24/2016)    . lidocaine-prilocaine (EMLA) cream Apply to affected area once (Patient not taking: Reported on 11/02/2016) 30 g 3  . loratadine (CLARITIN) 10 MG tablet Take 10 mg by mouth daily. On chemo days     No current facility-administered medications for this visit.  OBJECTIVE:   Vitals:   11/02/16 1450  BP: 133/78  Pulse: 92  Resp: 18  Temp: 98.3 F (36.8 C)  SpO2: 100%     Body mass index is 40.28 kg/m.   Filed Weights   11/02/16 1450  Weight: 261 lb (118.4 kg)  GENERAL: Patient is a well appearing female in no acute distress HEENT:  Sclerae anicteric.  Oropharynx clear and moist. No ulcerations or evidence of oropharyngeal candidiasis. Neck is supple.  NODES:  No cervical, supraclavicular, or axillary lymphadenopathy palpated.  BREAST EXAM:  Deferred. LUNGS:  Clear to auscultation bilaterally.  No wheezes or rhonchi. HEART:  Regular rate and rhythm. No murmur appreciated. ABDOMEN:  Soft, nontender.  Positive, normoactive bowel sounds. No organomegaly palpated. MSK:  No focal spinal tenderness to palpation. Full range of motion bilaterally in the upper extremities. EXTREMITIES:  No peripheral edema.   SKIN:  Clear with no obvious rashes or skin changes. No nail dyscrasia. NEURO:  Nonfocal. Well oriented. Tearful. ECOG FS:1 - Symptomatic but completely ambulatory    LAB RESULTS:  CMP     Component Value Date/Time   NA 140 10/26/2016 0409   NA 139 10/12/2016 1435   K 3.7 10/26/2016 0409   K 3.4 (L)  10/12/2016 1435   CL 105 10/26/2016 0409   CO2 27 10/26/2016 0409   CO2 25 10/12/2016 1435   GLUCOSE 103 (H) 10/26/2016 0409   GLUCOSE 156 (H) 10/12/2016 1435   BUN 5 (L) 10/26/2016 0409   BUN 5.5 (L) 10/12/2016 1435   CREATININE 0.52 10/26/2016 0409   CREATININE 0.8 10/12/2016 1435   CALCIUM 8.7 (L) 10/26/2016 0409   CALCIUM 9.2 10/12/2016 1435   PROT 6.0 (L) 10/25/2016 0410   PROT 6.5 10/12/2016 1435   ALBUMIN 2.9 (L) 10/25/2016 0410   ALBUMIN 3.4 (L) 10/12/2016 1435   AST 23 10/25/2016 0410   AST 27 10/12/2016 1435   ALT 25 10/25/2016 0410   ALT 38 10/12/2016 1435   ALKPHOS 84 10/25/2016 0410   ALKPHOS 87 10/12/2016 1435   BILITOT 0.8 10/25/2016 0410   BILITOT 0.67 10/12/2016 1435   GFRNONAA >60 10/26/2016 0409   GFRAA >60 10/26/2016 0409    No results found for: TOTALPROTELP, ALBUMINELP, A1GS, A2GS, BETS, BETA2SER, GAMS, MSPIKE, SPEI  No results found for: Nils Pyle, [facility]  Lab Results  Component Value Date   WBC 3.2 (L) 11/02/2016   NEUTROABS 2.3 11/02/2016   HGB 9.7 (L) 11/02/2016   HCT 28.9 (L) 11/02/2016   MCV 94.4 11/02/2016   PLT 172 11/02/2016      Chemistry      Component Value Date/Time   NA 140 10/26/2016 0409   NA 139 10/12/2016 1435   K 3.7 10/26/2016 0409   K 3.4 (L) 10/12/2016 1435   CL 105 10/26/2016 0409   CO2 27 10/26/2016 0409   CO2 25 10/12/2016 1435   BUN 5 (L) 10/26/2016 0409   BUN 5.5 (L) 10/12/2016 1435   CREATININE 0.52 10/26/2016 0409   CREATININE 0.8 10/12/2016 1435      Component Value Date/Time   CALCIUM 8.7 (L) 10/26/2016 0409   CALCIUM 9.2 10/12/2016 1435   ALKPHOS 84 10/25/2016 0410   ALKPHOS 87 10/12/2016 1435   AST 23 10/25/2016 0410   AST 27 10/12/2016 1435   ALT 25 10/25/2016 0410   ALT 38 10/12/2016 1435   BILITOT 0.8 10/25/2016 0410   BILITOT 0.67 10/12/2016 1435       No results  found for: LABCA2  No components found for: [id number]  No results for input(s): INR in the last 168  hours.  Urinalysis    Component Value Date/Time   COLORURINE YELLOW 10/24/2016 1621   APPEARANCEUR CLEAR 10/24/2016 1621   LABSPEC >1.030 (H) 10/24/2016 1621   PHURINE 5.5 10/24/2016 1621   GLUCOSEU NEGATIVE 10/24/2016 1621   HGBUR NEGATIVE 10/24/2016 1621   BILIRUBINUR NEGATIVE 10/24/2016 1621   KETONESUR NEGATIVE 10/24/2016 1621   PROTEINUR NEGATIVE 10/24/2016 1621   UROBILINOGEN 0.2 01/04/2010 1911   NITRITE NEGATIVE 10/24/2016 1621   LEUKOCYTESUR NEGATIVE 10/24/2016 1621     STUDIES: Dg Chest 2 View  Result Date: 10/24/2016 CLINICAL DATA:  Pain at Port-A-Cath site.  Left breast cancer. EXAM: CHEST  2 VIEW COMPARISON:  08/26/2016 FINDINGS: Right-sided Port-A-Cath is unchanged in position, with tip projecting over the mid SVC. Minimal right hemidiaphragm elevation. Midline trachea. Normal heart size and mediastinal contours. No pleural effusion or pneumothorax. Mildly low lung volumes. Clear lungs. IMPRESSION: No acute cardiopulmonary disease. Electronically Signed   By: Abigail Miyamoto M.D.   On: 10/24/2016 15:26    ELIGIBLE FOR AVAILABLE RESEARCH PROTOCOL: no   ASSESSMENT: 46 y.o. 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 Additional testing of the patient's mother and maternal aunt is pending. Otherwise [facility]'s Common Hereditary Cancers Panel found no deleterious mutations in APC, ATM, AXIN2, BARD1, BMPR1A, BRCA1, BRCA2, BRIP1, CDH1, CDKN2A, CHEK2, CTNNA1, DICER1, EPCAM, GREM1, HOXB13, KIT, MEN1, MLH1, MSH2, MSH3, MSH6, MUTYH, NBN, NF1, NTHL1, PALB2, PDGFRA, PMS2, POLD1, POLE, PTEN, RAD50,  RAD51C, RAD51D, SDHA, SDHB, SDHC, SDHD, SMAD4, SMARCA4, STK11, TP53, TSC1, TSC2, and VHL.  (2) neoadjuvant chemotherapy to consist of doxorubicin and cyclophosphamide in dose dense fashion 4 completed 09/10/2016 to be followed by paclitaxel weekly 12 given with carboplatin   (a) cycle 4 of cyclophosphamide and doxorubicin was delayed 10 days and dose decreased 10% because of febrile neutropenia after cycle 3  (3) definitive surgery to follow  (4) adjuvant radiation to follow surgery  PLAN: Zyara Is doing well today.  Her infection has resolved and she has recovered from her hospitalization.  We discussed this in detail today.  She is tolerating chemotherapy well.  She will proceed with week four of Paclitaxel today.  She will return in 1 week for labs, evaluation, and chemotherapy.    A total of (30) minutes of face-to-face time was spent with this patient with greater than 50% of that time in counseling and care-coordination.   Gardenia Phlegm, NP  11/02/16 3:02 PM Medical Oncology and Hematology [facility] [address] [place], [place] [address] Tel. [phone]    Fax. [phone]

## 2016-11-03 ENCOUNTER — Encounter: Payer: Self-pay | Admitting: Adult Health

## 2016-11-04 NOTE — Progress Notes (Signed)
[facility]  [phone] [phone]     ID: [name] DOB: Mar 13, 1970  MR#: [id number]  CXK#:[id number]  Patient Care Team: Sarah Pier, MD as PCP - General (Internal Medicine) Sarah Skates, MD as Consulting Physician (General Surgery) [name], Sarah Dad, MD as Consulting Physician (Oncology) Sarah Gibson, MD as Attending Physician (Radiation Oncology) OTHER MD:  CHIEF COMPLAINT: Triple negative breast cancer  CURRENT TREATMENT: Neoadjuvant chemotherapy  INTERVAL HISTORY: [name] returns today for follow-up and treatment of her triple negative breast cancer. She is currently on neoadjuvant chemotherapy, and started carboplatin/paclitaxel September 28, 2016.  Today is cycle 5 of a total of 12 planned cycles. She is well overall. She reports some hot flashes. Her last known menstrual period was 5 months ago at the start of treatment. She is having no issues with her port.   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 She reports generalized soreness due to the increased walking at the wedding. Her mother fell and broke her arm this past weekend, which has caused increased stress in her home. She denies mouth sores.  She denies unusual headaches, visual changes, nausea, vomiting, or dizziness. There has been no unusual cough, phlegm production, or pleurisy. This been no change in bowel or bladder habits. She denies unexplained fatigue or unexplained weight loss, bleeding, rash, or fever. A detailed review of systems was otherwise stable.       BREAST CANCER HISTORY: From the original intake note:  The patient was seen in the emergency room 05/16/2016 with nonspecific complaints but also noting that she had a tender lump in her lateral left breast which she said she had noted the day before. Exam by the emergency  room physician confirmed a 3 cm soft left lateral breast mass without overlying erythema or nipple changes. This was felt to be most consistent with fibrocystic change but the patient was referred back to her primary care physician for further evaluation. She saw the physician assistant in May 18 and then Dr. Wynetta Khan on 06/12/2016 who scheduled a bilateral diagnostic mammography with tomography and left breast ultrasonography at the [facility] 06/18/2016. This found the breast density to be category B. In the left breast upper outer quadrant there was an area of asymmetry measuring up to 10.3 cm. On exam this was firm and palpable and on ultrasound there was an irregular mass measuring 2.5 cm with some subtle changes in the surrounding tissue. In the left lower axilla there were 2 suspicious-looking lymph nodes.  Biopsy of the left breast upper outer quadrant and one of the suspicious lymph nodes 06/19/2016 showed ([id number]) both to be involved by invasive ductal carcinoma, grade 3, estrogen and progesterone receptor negative, HER-2 not amplified with a signals ratio being 1.53-1.74 and the number per cell 2.35-3.05. The MIB-1 was 90%.  The patient's subsequent history is as detailed below.   PAST MEDICAL HISTORY: Past Medical History:  Diagnosis Date  . Breast cancer ([name])    lf breast  . Malignant neoplasm of upper-outer quadrant of left breast in female, estrogen receptor negative ([name]) 06/23/2016    PAST SURGICAL HISTORY: Past Surgical History:  Procedure Laterality Date  . ANKLE SURGERY    . IR FLUORO GUIDE PORT INSERTION RIGHT  07/14/2016  . IR US GUIDE VASC ACCESS RIGHT  07/14/2016  . TUBAL LIGATION  FAMILY HISTORY Family History  Problem Relation Age of Onset  . Diabetes Mother   . Hypertension Mother   . Arthritis Mother   . Colon cancer Father 21       d.76 metastatic at time of diagnosis  . Diabetes Father   . Heart disease Father   . Hypertension Father   .  Breast cancer Maternal Grandmother 82       d.60s  . Breast cancer Maternal Aunt 52  . Cervical cancer Maternal Aunt 82  . Breast cancer Maternal Aunt 46       d.50s  . Cancer Maternal Uncle        d.62s unspecified type of cancer  . Breast cancer Cousin 61       paternal first-cousin (daughter of unaffected aunt)  . Cancer Maternal Aunt 83       "Female Cancer"  . Cancer Maternal Aunt        unknown cancer  The patient's father died at age 60 from colon cancer which she never had treated. The patient's mother is living, 46 years old as of June 2018. The patient has one brother, one sister. On the mother's side there is a history of breast cancer in the grandmother, age 51, and then 1 aunt with breast cancer age 39, and another with lung cancer age 34. There is no history of ovarian cancer in the family.  GYNECOLOGIC HISTORY:  No LMP recorded. Menarche age 65, first live birth age 65. She is GX P4. Her periods have never been regular. She had a period in June 2018 but not for 4 months before that. She took birth control for some years remotely with no complications. She is status post bilateral tubal ligation.  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 Her husband [name] (goes by "[name]") is originally from [name]. The patient's children from her first marriage are [name] "[name]" [name] 47 years old (all ages as of June 2018) recently in the [employer] but now taking courses sent rocking him immunity college, and [name], who is studying psychology at [name]. Together with [name] the patient's children are [name] and [name], aged 62 and 34.     ADVANCED DIRECTIVES: Not in place   HEALTH MAINTENANCE: Social History   Tobacco Use  . Smoking status: Never Smoker  . Smokeless tobacco: Never Used   Substance Use Topics  . Alcohol use: No  . Drug use: No     Colonoscopy:Never   VNR:[facility] 2018  Bone density:Never   No Known Allergies  Current Outpatient Medications  Medication Sig Dispense Refill  . acetaminophen (TYLENOL) 500 MG tablet Take 2 tablets (1,000 mg total) by mouth every 6 (six) hours as needed for mild pain. For fever/pain    . docusate sodium (COLACE) 100 MG capsule Take 1 capsule (100 mg total) by mouth 2 (two) times daily. 60 capsule 2  . lidocaine-prilocaine (EMLA) cream Apply to affected area once 30 g 3  . loratadine (CLARITIN) 10 MG tablet Take 10 mg by mouth daily. On chemo days    . prochlorperazine (COMPAZINE) 10 MG tablet Take 10 mg by mouth every 6 (six) hours as needed for nausea or vomiting. On day of and three days after chemo    . traZODone (DESYREL) 50 MG tablet  Take 100 mg by mouth at bedtime as needed for sleep.     No current facility-administered medications for this visit.     OBJECTIVE: Young white woman who appears stated age  46:   11/09/16 1413  BP: 122/70  Pulse: (!) 108  Resp: 18  Temp: 98.3 F (36.8 C)  SpO2: 99%     Body mass index is 39.58 kg/m.   Filed Weights   11/09/16 1413  Weight: 256 lb 8 oz (116.3 kg)   ECOG FS:1 - Symptomatic but completely ambulatory  Sclerae unicteric, EOMs intact Oropharynx clear and moist No cervical or supraclavicular adenopathy Lungs no rales or rhonchi Heart regular rate and rhythm Abd soft, nontender, positive bowel sounds MSK no focal spinal tenderness, no upper extremity lymphedema Neuro: nonfocal, well oriented, appropriate affect Breasts: The right breast is benign.  I do not palpate any mass in the left breast and there are no skin or nipple changes of concern both axilla are benign    LAB RESULTS:  CMP     Component Value Date/Time   NA 140 11/09/2016 1342   K 3.4 (L) 11/09/2016 1342   CL 105 10/26/2016 0409   CO2 26 11/09/2016 1342   GLUCOSE 135 11/09/2016 1342    BUN 6.2 (L) 11/09/2016 1342   CREATININE 0.7 11/09/2016 1342   CALCIUM 9.3 11/09/2016 1342   PROT 6.9 11/09/2016 1342   ALBUMIN 3.5 11/09/2016 1342   AST 29 11/09/2016 1342   ALT 33 11/09/2016 1342   ALKPHOS 86 11/09/2016 1342   BILITOT 1.00 11/09/2016 1342   GFRNONAA >60 10/26/2016 0409   GFRAA >60 10/26/2016 0409    No results found for: TOTALPROTELP, ALBUMINELP, A1GS, A2GS, BETS, BETA2SER, GAMS, MSPIKE, SPEI  No results found for: KPAFRELGTCHN, LAMBDASER, KAPLAMBRATIO  Lab Results  Component Value Date   WBC 2.4 (L) 11/09/2016   NEUTROABS 1.5 11/09/2016   HGB 9.4 (L) 11/09/2016   HCT 28.4 (L) 11/09/2016   MCV 95.0 11/09/2016   PLT 193 11/09/2016      Chemistry      Component Value Date/Time   NA 140 11/09/2016 1342   K 3.4 (L) 11/09/2016 1342   CL 105 10/26/2016 0409   CO2 26 11/09/2016 1342   BUN 6.2 (L) 11/09/2016 1342   CREATININE 0.7 11/09/2016 1342      Component Value Date/Time   CALCIUM 9.3 11/09/2016 1342   ALKPHOS 86 11/09/2016 1342   AST 29 11/09/2016 1342   ALT 33 11/09/2016 1342   BILITOT 1.00 11/09/2016 1342       No results found for: LABCA2  No components found for: [id number]  No results for input(s): INR in the last 168 hours.  Urinalysis    Component Value Date/Time   COLORURINE YELLOW 10/24/2016 1621   APPEARANCEUR CLEAR 10/24/2016 1621   LABSPEC >1.030 (H) 10/24/2016 1621   PHURINE 5.5 10/24/2016 1621   GLUCOSEU NEGATIVE 10/24/2016 1621   HGBUR NEGATIVE 10/24/2016 1621   BILIRUBINUR NEGATIVE 10/24/2016 1621   KETONESUR NEGATIVE 10/24/2016 1621   PROTEINUR NEGATIVE 10/24/2016 1621   UROBILINOGEN 0.2 01/04/2010 1911   NITRITE NEGATIVE 10/24/2016 1621   LEUKOCYTESUR NEGATIVE 10/24/2016 1621     STUDIES: Dg Chest 2 View  Result Date: 10/24/2016 CLINICAL DATA:  Pain at Port-A-Cath site.  Left breast cancer. EXAM: CHEST  2 VIEW COMPARISON:  08/26/2016 FINDINGS: Right-sided Port-A-Cath is unchanged in position, with tip  projecting over the mid SVC. Minimal right hemidiaphragm elevation. Midline trachea. Normal  heart size and mediastinal contours. No pleural effusion or pneumothorax. Mildly low lung volumes. Clear lungs. IMPRESSION: No acute cardiopulmonary disease. Electronically Signed   By: Abigail Miyamoto M.D.   On: 10/24/2016 15:26    ELIGIBLE FOR AVAILABLE RESEARCH PROTOCOL: no   ASSESSMENT: 46 y.o. 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 Additional testing of the patient's mother and maternal aunt is pending. Otherwise [facility]'s Common Hereditary Cancers Panel found no deleterious mutations in APC, ATM, AXIN2, BARD1, BMPR1A, BRCA1, BRCA2, BRIP1, CDH1, CDKN2A, CHEK2, CTNNA1, DICER1, EPCAM, GREM1, HOXB13, KIT, MEN1, MLH1, MSH2, MSH3, MSH6, MUTYH, NBN, NF1, NTHL1, PALB2, PDGFRA, PMS2, POLD1, POLE, PTEN, RAD50, RAD51C, RAD51D, SDHA, SDHB, SDHC, SDHD, SMAD4, SMARCA4, STK11, TP53, TSC1, TSC2, and VHL.  (2) neoadjuvant chemotherapy to consist of doxorubicin and cyclophosphamide in dose dense fashion 4 completed 09/10/2016 to be followed by paclitaxel weekly 12 given with carboplatin   (a) cycle 4 of cyclophosphamide and doxorubicin was delayed 10 days and dose decreased 10% because of febrile neutropenia after cycle 3  (3) definitive surgery to follow  (4) adjuvant radiation to follow surgery  PLAN: Joee will proceed to her fifth of 12 planned weekly doses of carboplatin and paclitaxel today.  We have had to interrupt treatment at times but we have not decreased the doses.  She will need OnPro after the next cycle and  I have put that in for her.  She was having excessive sleepiness from the premed Benadryl and I have dropped that to 25 mg from 50 mg.  As far as the pelvic rawness is concerned I counseled her on how to use Monistat if she develops a yeast infection.  She will use some coconut oil in the meantime.  She will let us know if that does not work for her  She will eat food at least once daily to correct the low potassium  She knows to call for any other problems that may develop before her next visit.  [name], Sarah Dad, MD  11/09/16 2:42 PM Medical Oncology and Hematology [facility] [address] [place], [place] [address] Tel. [phone]    Fax. [phone]  This document serves as a record of services personally performed by Lurline Del, MD. It was created on his behalf by Sheron Nightingale, a trained medical scribe. The creation of this record is based on the scribe's personal observations and the provider's statements to them.   I have reviewed the above documentation for accuracy and completeness, and I agree with the above.

## 2016-11-09 ENCOUNTER — Other Ambulatory Visit (HOSPITAL_BASED_OUTPATIENT_CLINIC_OR_DEPARTMENT_OTHER): Payer: Medicaid Other

## 2016-11-09 ENCOUNTER — Ambulatory Visit (HOSPITAL_BASED_OUTPATIENT_CLINIC_OR_DEPARTMENT_OTHER): Payer: Medicaid Other

## 2016-11-09 ENCOUNTER — Ambulatory Visit: Payer: Medicaid Other

## 2016-11-09 ENCOUNTER — Ambulatory Visit (HOSPITAL_BASED_OUTPATIENT_CLINIC_OR_DEPARTMENT_OTHER): Payer: Medicaid Other | Admitting: Oncology

## 2016-11-09 VITALS — BP 122/70 | HR 108 | Temp 98.3°F | Resp 18 | Ht 67.5 in | Wt 256.5 lb

## 2016-11-09 DIAGNOSIS — Z171 Estrogen receptor negative status [ER-]: Secondary | ICD-10-CM | POA: Diagnosis not present

## 2016-11-09 DIAGNOSIS — C50412 Malignant neoplasm of upper-outer quadrant of left female breast: Secondary | ICD-10-CM

## 2016-11-09 DIAGNOSIS — Z5111 Encounter for antineoplastic chemotherapy: Secondary | ICD-10-CM

## 2016-11-09 DIAGNOSIS — Z95828 Presence of other vascular implants and grafts: Secondary | ICD-10-CM

## 2016-11-09 DIAGNOSIS — L03818 Cellulitis of other sites: Secondary | ICD-10-CM

## 2016-11-09 DIAGNOSIS — C773 Secondary and unspecified malignant neoplasm of axilla and upper limb lymph nodes: Secondary | ICD-10-CM | POA: Diagnosis not present

## 2016-11-09 DIAGNOSIS — Z6841 Body Mass Index (BMI) 40.0 and over, adult: Secondary | ICD-10-CM

## 2016-11-09 LAB — CBC WITH DIFFERENTIAL/PLATELET
BASO%: 0.8 % (ref 0.0–2.0)
BASOS ABS: 0 10*3/uL (ref 0.0–0.1)
EOS%: 2.5 % (ref 0.0–7.0)
Eosinophils Absolute: 0.1 10*3/uL (ref 0.0–0.5)
HCT: 28.4 % — ABNORMAL LOW (ref 34.8–46.6)
HEMOGLOBIN: 9.4 g/dL — AB (ref 11.6–15.9)
LYMPH%: 28.2 % (ref 14.0–49.7)
MCH: 31.4 pg (ref 25.1–34.0)
MCHC: 33.1 g/dL (ref 31.5–36.0)
MCV: 95 fL (ref 79.5–101.0)
MONO#: 0.2 10*3/uL (ref 0.1–0.9)
MONO%: 6.3 % (ref 0.0–14.0)
NEUT#: 1.5 10*3/uL (ref 1.5–6.5)
NEUT%: 62.2 % (ref 38.4–76.8)
Platelets: 193 10*3/uL (ref 145–400)
RBC: 2.99 10*6/uL — AB (ref 3.70–5.45)
RDW: 15.6 % — AB (ref 11.2–14.5)
WBC: 2.4 10*3/uL — ABNORMAL LOW (ref 3.9–10.3)
lymph#: 0.7 10*3/uL — ABNORMAL LOW (ref 0.9–3.3)

## 2016-11-09 LAB — COMPREHENSIVE METABOLIC PANEL
ALBUMIN: 3.5 g/dL (ref 3.5–5.0)
ALT: 33 U/L (ref 0–55)
AST: 29 U/L (ref 5–34)
Alkaline Phosphatase: 86 U/L (ref 40–150)
Anion Gap: 10 mEq/L (ref 3–11)
BUN: 6.2 mg/dL — AB (ref 7.0–26.0)
CHLORIDE: 103 meq/L (ref 98–109)
CO2: 26 meq/L (ref 22–29)
Calcium: 9.3 mg/dL (ref 8.4–10.4)
Creatinine: 0.7 mg/dL (ref 0.6–1.1)
EGFR: >60 mL/min/{1.73_m2} (ref >60)
GLUCOSE: 135 mg/dL (ref 70–140)
POTASSIUM: 3.4 meq/L — AB (ref 3.5–5.1)
SODIUM: 140 meq/L (ref 136–145)
Total Bilirubin: 1 mg/dL (ref 0.20–1.20)
Total Protein: 6.9 g/dL (ref 6.4–8.3)

## 2016-11-09 MED ORDER — PALONOSETRON HCL INJECTION 0.25 MG/5ML
INTRAVENOUS | Status: AC
  Filled 2016-11-09: qty 5

## 2016-11-09 MED ORDER — SODIUM CHLORIDE 0.9 % IV SOLN
80.0000 mg/m2 | Freq: Once | INTRAVENOUS | Status: AC
  Administered 2016-11-09: 186 mg via INTRAVENOUS
  Filled 2016-11-09: qty 31

## 2016-11-09 MED ORDER — DIPHENHYDRAMINE HCL 50 MG/ML IJ SOLN
INTRAMUSCULAR | Status: AC
  Filled 2016-11-09: qty 1

## 2016-11-09 MED ORDER — DIPHENHYDRAMINE HCL 50 MG/ML IJ SOLN
25.0000 mg | Freq: Once | INTRAMUSCULAR | Status: AC
  Administered 2016-11-09: 25 mg via INTRAVENOUS

## 2016-11-09 MED ORDER — SODIUM CHLORIDE 0.9 % IV SOLN
20.0000 mg | Freq: Once | INTRAVENOUS | Status: AC
  Administered 2016-11-09: 20 mg via INTRAVENOUS
  Filled 2016-11-09: qty 2

## 2016-11-09 MED ORDER — SODIUM CHLORIDE 0.9 % IV SOLN
Freq: Once | INTRAVENOUS | Status: AC
  Administered 2016-11-09: 16:00:00 via INTRAVENOUS

## 2016-11-09 MED ORDER — PALONOSETRON HCL INJECTION 0.25 MG/5ML
0.2500 mg | Freq: Once | INTRAVENOUS | Status: AC
  Administered 2016-11-09: 0.25 mg via INTRAVENOUS

## 2016-11-09 MED ORDER — SODIUM CHLORIDE 0.9% FLUSH
10.0000 mL | Freq: Once | INTRAVENOUS | Status: AC
  Administered 2016-11-09: 10 mL
  Filled 2016-11-09: qty 10

## 2016-11-09 MED ORDER — FAMOTIDINE IN NACL 20-0.9 MG/50ML-% IV SOLN
INTRAVENOUS | Status: AC
  Filled 2016-11-09: qty 50

## 2016-11-09 MED ORDER — FAMOTIDINE IN NACL 20-0.9 MG/50ML-% IV SOLN
20.0000 mg | Freq: Once | INTRAVENOUS | Status: AC
  Administered 2016-11-09: 20 mg via INTRAVENOUS

## 2016-11-09 MED ORDER — HEPARIN SOD (PORK) LOCK FLUSH 100 UNIT/ML IV SOLN
500.0000 [IU] | Freq: Once | INTRAVENOUS | Status: AC | PRN
  Administered 2016-11-09: 500 [IU]
  Filled 2016-11-09: qty 5

## 2016-11-09 MED ORDER — SODIUM CHLORIDE 0.9% FLUSH
10.0000 mL | INTRAVENOUS | Status: DC | PRN
  Administered 2016-11-09: 10 mL
  Filled 2016-11-09: qty 10

## 2016-11-09 MED ORDER — SODIUM CHLORIDE 0.9 % IV SOLN
300.0000 mg | Freq: Once | INTRAVENOUS | Status: AC
  Administered 2016-11-09: 300 mg via INTRAVENOUS
  Filled 2016-11-09: qty 30

## 2016-11-09 NOTE — Patient Instructions (Signed)
   [facility] Discharge Instructions for Patients Receiving Chemotherapy  Today you received the following chemotherapy agents Taxol and Carboplatin   To help prevent nausea and vomiting after your treatment, we encourage you to take your nausea medication as directed.    If you develop nausea and vomiting that is not controlled by your nausea medication, call the clinic.   BELOW ARE SYMPTOMS THAT SHOULD BE REPORTED IMMEDIATELY:  *FEVER GREATER THAN 100.5 F  *CHILLS WITH OR WITHOUT FEVER  NAUSEA AND VOMITING THAT IS NOT CONTROLLED WITH YOUR NAUSEA MEDICATION  *UNUSUAL SHORTNESS OF BREATH  *UNUSUAL BRUISING OR BLEEDING  TENDERNESS IN MOUTH AND THROAT WITH OR WITHOUT PRESENCE OF ULCERS  *URINARY PROBLEMS  *BOWEL PROBLEMS  UNUSUAL RASH Items with * indicate a potential emergency and should be followed up as soon as possible.  Feel free to call the clinic should you have any questions or concerns. The clinic phone number is [phone].  Please show the CHEMO ALERT CARD at check-in to the Emergency Department and triage nurse.   

## 2016-11-10 ENCOUNTER — Encounter: Payer: Self-pay | Admitting: *Deleted

## 2016-11-16 ENCOUNTER — Ambulatory Visit: Payer: Medicaid Other

## 2016-11-16 ENCOUNTER — Ambulatory Visit (HOSPITAL_BASED_OUTPATIENT_CLINIC_OR_DEPARTMENT_OTHER): Payer: Medicaid Other

## 2016-11-16 ENCOUNTER — Ambulatory Visit (HOSPITAL_BASED_OUTPATIENT_CLINIC_OR_DEPARTMENT_OTHER): Payer: Medicaid Other | Admitting: Oncology

## 2016-11-16 ENCOUNTER — Other Ambulatory Visit (HOSPITAL_BASED_OUTPATIENT_CLINIC_OR_DEPARTMENT_OTHER): Payer: Medicaid Other

## 2016-11-16 VITALS — BP 147/90 | HR 108 | Temp 98.8°F | Resp 18

## 2016-11-16 VITALS — BP 134/87 | HR 102 | Temp 98.4°F | Resp 20 | Ht 67.5 in | Wt 257.5 lb

## 2016-11-16 DIAGNOSIS — C50412 Malignant neoplasm of upper-outer quadrant of left female breast: Secondary | ICD-10-CM

## 2016-11-16 DIAGNOSIS — C773 Secondary and unspecified malignant neoplasm of axilla and upper limb lymph nodes: Secondary | ICD-10-CM | POA: Diagnosis not present

## 2016-11-16 DIAGNOSIS — Z171 Estrogen receptor negative status [ER-]: Principal | ICD-10-CM

## 2016-11-16 DIAGNOSIS — D709 Neutropenia, unspecified: Secondary | ICD-10-CM

## 2016-11-16 DIAGNOSIS — Z95828 Presence of other vascular implants and grafts: Secondary | ICD-10-CM

## 2016-11-16 DIAGNOSIS — Z5189 Encounter for other specified aftercare: Secondary | ICD-10-CM

## 2016-11-16 LAB — COMPREHENSIVE METABOLIC PANEL
ALBUMIN: 3.6 g/dL (ref 3.5–5.0)
ALK PHOS: 91 U/L (ref 40–150)
ALT: 40 U/L (ref 0–55)
AST: 28 U/L (ref 5–34)
Anion Gap: 11 mEq/L (ref 3–11)
BILIRUBIN TOTAL: 0.91 mg/dL (ref 0.20–1.20)
BUN: 7.2 mg/dL (ref 7.0–26.0)
CALCIUM: 9.4 mg/dL (ref 8.4–10.4)
CO2: 24 mEq/L (ref 22–29)
CREATININE: 0.8 mg/dL (ref 0.6–1.1)
Chloride: 103 mEq/L (ref 98–109)
EGFR: >60 mL/min/{1.73_m2} (ref >60)
Glucose: 160 mg/dl — ABNORMAL HIGH (ref 70–140)
Potassium: 3.6 mEq/L (ref 3.5–5.1)
Sodium: 138 mEq/L (ref 136–145)
TOTAL PROTEIN: 7.1 g/dL (ref 6.4–8.3)

## 2016-11-16 LAB — CBC WITH DIFFERENTIAL/PLATELET
BASO%: 0.8 % (ref 0.0–2.0)
Basophils Absolute: 0 10*3/uL (ref 0.0–0.1)
EOS%: 0.8 % (ref 0.0–7.0)
Eosinophils Absolute: 0 10*3/uL (ref 0.0–0.5)
HEMATOCRIT: 28.3 % — AB (ref 34.8–46.6)
HEMOGLOBIN: 9.6 g/dL — AB (ref 11.6–15.9)
LYMPH#: 0.6 10*3/uL — AB (ref 0.9–3.3)
LYMPH%: 44.6 % (ref 14.0–49.7)
MCH: 31.9 pg (ref 25.1–34.0)
MCHC: 33.9 g/dL (ref 31.5–36.0)
MCV: 94 fL (ref 79.5–101.0)
MONO#: 0.1 10*3/uL (ref 0.1–0.9)
MONO%: 5.4 % (ref 0.0–14.0)
NEUT#: 0.6 10*3/uL — ABNORMAL LOW (ref 1.5–6.5)
NEUT%: 48.4 % (ref 38.4–76.8)
PLATELETS: 170 10*3/uL (ref 145–400)
RBC: 3.01 10*6/uL — ABNORMAL LOW (ref 3.70–5.45)
RDW: 15.3 % — AB (ref 11.2–14.5)
WBC: 1.3 10*3/uL — ABNORMAL LOW (ref 3.9–10.3)

## 2016-11-16 MED ORDER — SODIUM CHLORIDE 0.9% FLUSH
10.0000 mL | Freq: Once | INTRAVENOUS | Status: AC
  Administered 2016-11-16: 10 mL
  Filled 2016-11-16: qty 10

## 2016-11-16 MED ORDER — HEPARIN SOD (PORK) LOCK FLUSH 100 UNIT/ML IV SOLN
500.0000 [IU] | Freq: Once | INTRAVENOUS | Status: AC
  Administered 2016-11-16: 500 [IU]
  Filled 2016-11-16: qty 5

## 2016-11-16 MED ORDER — TBO-FILGRASTIM 300 MCG/0.5ML ~~LOC~~ SOSY
300.0000 ug | PREFILLED_SYRINGE | Freq: Once | SUBCUTANEOUS | Status: AC
Start: 2016-11-16 — End: 2016-11-16
  Administered 2016-11-16: 300 ug via SUBCUTANEOUS
  Filled 2016-11-16: qty 0.5

## 2016-11-16 NOTE — Progress Notes (Signed)
[place]  [phone] [phone]     ID: [name] DOB: [birth year]  MR#: [id number]  PPI#:[id number]  Patient Care Team: [name], MD as PCP - General (Internal Medicine) [name], MD as Consulting Physician (General Surgery) Evren Shankland, [name], MD as Consulting Physician (Oncology) [name], MD as Attending Physician (Radiation Oncology) OTHER MD:  CHIEF COMPLAINT: Triple negative breast cancer  CURRENT TREATMENT: Neoadjuvant chemotherapy  INTERVAL HISTORY: [name] returns today for follow-up and treatment of her triple negative breast cancer. She is accompanied by her daughter. She is currently receiving neoadjuvant chemotherapy and today is day 1 cycle 6 of 12 weekly paclitaxel dose as planned. Her white blood cell counts are low and will not be treated today.   REVIEW OF SYSTEMS: [name] reports that her mother recently had surgery at [facility] for her shoulder. She did not go to the hospital, but notes that it caused her some stress because she is the one who normally helps to take care of her mother. She reports that she has been taking it easy. She denies unusual headaches, visual changes, nausea, vomiting, or dizziness. There has been no unusual cough, phlegm production, or pleurisy. This been no change in bowel or bladder habits. She denies unexplained fatigue or unexplained weight loss, bleeding, rash, or fever. A detailed review of systems was otherwise entirely stable.      BREAST CANCER HISTORY: From the original intake note:  The patient was seen in the emergency room 05/16/2016 with nonspecific complaints but also noting that she had a tender lump in her lateral left breast which she said she had noted the day before. Exam by the emergency room physician confirmed a 3 cm soft left lateral breast mass without overlying erythema or nipple changes. This was felt to be most consistent with fibrocystic change but the  patient was referred back to her primary care physician for further evaluation. She saw the physician assistant in May 18 and then Dr. Wynetta Emery on 06/12/2016 who scheduled a bilateral diagnostic mammography with tomography and left breast ultrasonography at the [facility] 06/18/2016. This found the breast density to be category B. In the left breast upper outer quadrant there was an area of asymmetry measuring up to 10.3 cm. On exam this was firm and palpable and on ultrasound there was an irregular mass measuring 2.5 cm with some subtle changes in the surrounding tissue. In the left lower axilla there were 2 suspicious-looking lymph nodes.  Biopsy of the left breast upper outer quadrant and one of the suspicious lymph nodes 06/19/2016 showed ([id number]) both to be involved by invasive ductal carcinoma, grade 3, estrogen and progesterone receptor negative, HER-2 not amplified with a signals ratio being 1.53-1.74 and the number per cell 2.35-3.05. The MIB-1 was 90%.  The patient's subsequent history is as detailed below.   PAST MEDICAL HISTORY: Past Medical History:  Diagnosis Date  . Breast cancer ([place])    lf breast  . Malignant neoplasm of upper-outer quadrant of left breast in female, estrogen receptor negative ([place]) 06/23/2016    PAST SURGICAL HISTORY: Past Surgical History:  Procedure Laterality Date  . ANKLE SURGERY    . IR FLUORO GUIDE PORT INSERTION RIGHT  07/14/2016  . IR US GUIDE VASC ACCESS RIGHT  07/14/2016  . TUBAL LIGATION      FAMILY HISTORY Family History  Problem Relation Age of Onset  . Diabetes Mother   . Hypertension Mother   . Arthritis Mother   .  Colon cancer Father 13       d.76 metastatic at time of diagnosis  . Diabetes Father   . Heart disease Father   . Hypertension Father   . Breast cancer Maternal Grandmother 49       d.60s  . Breast cancer Maternal Aunt 52  . Cervical cancer Maternal Aunt 57  . Breast cancer Maternal Aunt 46       d.50s  . Cancer  Maternal Uncle        d.62s unspecified type of cancer  . Breast cancer Cousin 66       paternal first-cousin (daughter of unaffected aunt)  . Cancer Maternal Aunt 66       "Female Cancer"  . Cancer Maternal Aunt        unknown cancer  The patient's father died at age 53 from colon cancer which she never had treated. The patient's mother is living, 28 years old as of June 2018. The patient has one brother, one sister. On the mother's side there is a history of breast cancer in the grandmother, age 96, and then 1 aunt with breast cancer age 44, and another with lung cancer age 53. There is no history of ovarian cancer in the family.  GYNECOLOGIC HISTORY:  No LMP recorded. Menarche age 57, first live birth age 40. She is GX P4. Her periods have never been regular. She had a period in June 2018 but not for 4 months before that. She took birth control for some years remotely with no complications. She is status post bilateral tubal ligation.  SOCIAL HISTORY:  The patient's husband owns and runs a food truck in the area. They serve mostly [place] and [place] food. 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 Her husband Ravin Bendall (goes by "[name]") is originally from [name]. The patient has two children from her first marriage. [name], who is 47 years old, as of June 2018, who was recently in the [employer], but now is taking courses at [employer] and [name], who is studying psychology at [name]. Together with [name] the patient has two children who are [name] and hila, aged 67 and 41.     ADVANCED DIRECTIVES: Not in place   HEALTH MAINTENANCE: Social History   Tobacco Use  . Smoking status: Never Smoker  . Smokeless tobacco: Never Used  Substance Use Topics  . Alcohol use: No  . Drug use: No     Colonoscopy:Never   EUM:PNTI 2018  Bone density:Never   No Known  Allergies  Current Outpatient Medications  Medication Sig Dispense Refill  . acetaminophen (TYLENOL) 500 MG tablet Take 2 tablets (1,000 mg total) by mouth every 6 (six) hours as needed for mild pain. For fever/pain    . docusate sodium (COLACE) 100 MG capsule Take 1 capsule (100 mg total) by mouth 2 (two) times daily. 60 capsule 2  . lidocaine-prilocaine (EMLA) cream Apply to affected area once 30 g 3  . loratadine (CLARITIN) 10 MG tablet Take 10 mg by mouth daily. On chemo days    . prochlorperazine (COMPAZINE) 10 MG tablet Take 10 mg by mouth every 6 (six) hours as needed for nausea or vomiting. On day of and three days after chemo    . traZODone (DESYREL) 50 MG tablet Take 100 mg by mouth at bedtime as needed for sleep.     No current facility-administered medications for  this visit.     OBJECTIVE: Young white woman in no acute distress  Vitals:   11/16/16 1434  BP: 134/87  Pulse: (!) 102  Resp: 20  Temp: 98.4 F (36.9 C)  SpO2: 100%     Body mass index is 39.73 kg/m.   Filed Weights   11/16/16 1434  Weight: 257 lb 8 oz (116.8 kg)   ECOG FS:1 - Symptomatic but completely ambulatory  Sclerae unicteric, pupils round and equal Oropharynx clear and moist No cervical or supraclavicular adenopathy Lungs no rales or rhonchi Heart regular rate and rhythm Abd soft, nontender, positive bowel sounds MSK no focal spinal tenderness, no upper extremity lymphedema Neuro: nonfocal, well oriented, appropriate affect Breasts: The right breast is unremarkable.  There is no palpable mass and no skin or nipple change of concern in the left breast.  Both axillae are benign.  LAB RESULTS:  CMP     Component Value Date/Time   NA 140 11/09/2016 1342   K 3.4 (L) 11/09/2016 1342   CL 105 10/26/2016 0409   CO2 26 11/09/2016 1342   GLUCOSE 135 11/09/2016 1342   BUN 6.2 (L) 11/09/2016 1342   CREATININE 0.7 11/09/2016 1342   CALCIUM 9.3 11/09/2016 1342   PROT 6.9 11/09/2016 1342    ALBUMIN 3.5 11/09/2016 1342   AST 29 11/09/2016 1342   ALT 33 11/09/2016 1342   ALKPHOS 86 11/09/2016 1342   BILITOT 1.00 11/09/2016 1342   GFRNONAA >60 10/26/2016 0409   GFRAA >60 10/26/2016 0409    No results found for: TOTALPROTELP, ALBUMINELP, A1GS, A2GS, BETS, BETA2SER, GAMS, MSPIKE, SPEI  No results found for: KPAFRELGTCHN, LAMBDASER, KAPLAMBRATIO  Lab Results  Component Value Date   WBC 1.3 (L) 11/16/2016   NEUTROABS 0.6 (L) 11/16/2016   HGB 9.6 (L) 11/16/2016   HCT 28.3 (L) 11/16/2016   MCV 94.0 11/16/2016   PLT 170 11/16/2016      Chemistry      Component Value Date/Time   NA 140 11/09/2016 1342   K 3.4 (L) 11/09/2016 1342   CL 105 10/26/2016 0409   CO2 26 11/09/2016 1342   BUN 6.2 (L) 11/09/2016 1342   CREATININE 0.7 11/09/2016 1342      Component Value Date/Time   CALCIUM 9.3 11/09/2016 1342   ALKPHOS 86 11/09/2016 1342   AST 29 11/09/2016 1342   ALT 33 11/09/2016 1342   BILITOT 1.00 11/09/2016 1342       No results found for: LABCA2  No components found for: BTDHRC163  No results for input(s): INR in the last 168 hours.  Urinalysis    Component Value Date/Time   COLORURINE YELLOW 10/24/2016 1621   APPEARANCEUR CLEAR 10/24/2016 1621   LABSPEC >1.030 (H) 10/24/2016 1621   PHURINE 5.5 10/24/2016 1621   GLUCOSEU NEGATIVE 10/24/2016 1621   HGBUR NEGATIVE 10/24/2016 1621   BILIRUBINUR NEGATIVE 10/24/2016 1621   KETONESUR NEGATIVE 10/24/2016 1621   PROTEINUR NEGATIVE 10/24/2016 1621   UROBILINOGEN 0.2 01/04/2010 1911   NITRITE NEGATIVE 10/24/2016 1621   LEUKOCYTESUR NEGATIVE 10/24/2016 1621     STUDIES: Dg Chest 2 View  Result Date: 10/24/2016 CLINICAL DATA:  Pain at Port-A-Cath site.  Left breast cancer. EXAM: CHEST  2 VIEW COMPARISON:  08/26/2016 FINDINGS: Right-sided Port-A-Cath is unchanged in position, with tip projecting over the mid SVC. Minimal right hemidiaphragm elevation. Midline trachea. Normal heart size and mediastinal  contours. No pleural effusion or pneumothorax. Mildly low lung volumes. Clear lungs. IMPRESSION: No acute cardiopulmonary disease.  Electronically Signed   By: Abigail Miyamoto M.D.   On: 10/24/2016 15:26    ELIGIBLE FOR AVAILABLE RESEARCH PROTOCOL: no   ASSESSMENT: 46 y.o. 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 Additional testing of the patient's mother and maternal aunt is pending. Otherwise [facility]'s Common Hereditary Cancers Panel found no deleterious mutations in APC, ATM, AXIN2, BARD1, BMPR1A, BRCA1, BRCA2, BRIP1, CDH1, CDKN2A, CHEK2, CTNNA1, DICER1, EPCAM, GREM1, HOXB13, KIT, MEN1, MLH1, MSH2, MSH3, MSH6, MUTYH, NBN, NF1, NTHL1, PALB2, PDGFRA, PMS2, POLD1, POLE, PTEN, RAD50, RAD51C, RAD51D, SDHA, SDHB, SDHC, SDHD, SMAD4, SMARCA4, STK11, TP53, TSC1, TSC2, and VHL.  (2) neoadjuvant chemotherapy to consist of doxorubicin and cyclophosphamide in dose dense fashion 4 completed 09/10/2016 to be followed by paclitaxel weekly 12 given with carboplatin   (a) cycle 4 of cyclophosphamide and doxorubicin was delayed 10 days and dose decreased 10% because of febrile neutropenia after cycle 3  (3) definitive surgery to follow  (4) adjuvant radiation to follow surgery  PLAN: [name] was going to receive her sixth of 12 planned doses of chemo today, followed by Neulasta, but her ANC is low and given her past problems with infection I do not think we want to take a risk.  Accordingly I am setting her up for Neupogen today in the next 3 days.  Hopefully she can be treated next week, and if so then we can continue to  give her Neupogen 3 times after each Monday treatment (she would receive these on Wednesday Thursday and Friday) in order to keep her counts up so we can treat her in a reasonable amount of time.  The good news is that I feel no mass at all in her left breast.  I think she will be very pleased with the final surgical results.  She knows to call us for any temperature or any other symptoms of concern  Derek Laughter, [name], MD  11/16/16 2:47 PM Medical Oncology and Hematology [facility] [address] [place], [place] [address] Tel. [phone]    Fax. [phone]  This document serves as a record of services personally performed by Chauncey Cruel, MD. It was created on his behalf by Margit Banda, a trained medical scribe. The creation of this record is based on the scribe's personal observations and the provider's statements to them.   I have reviewed the above documentation for accuracy and completeness, and I agree with the above.

## 2016-11-16 NOTE — Patient Instructions (Addendum)
Tbo-Filgrastim injection What is this medicine? TBO-FILGRASTIM (T B O fil GRA stim) is a granulocyte colony-stimulating factor that stimulates the growth of neutrophils, a type of white blood cell important in the body's fight against infection. It is used to reduce the incidence of fever and infection in patients with certain types of cancer who are receiving chemotherapy that affects the bone marrow. This medicine may be used for other purposes; ask your health care provider or pharmacist if you have questions. COMMON BRAND NAME(S): Granix What should I tell my health care provider before I take this medicine? They need to know if you have any of these conditions: -bone scan or tests planned -kidney disease -sickle cell anemia -an unusual or allergic reaction to tbo-filgrastim, filgrastim, pegfilgrastim, other medicines, foods, dyes, or preservatives -pregnant or trying to get pregnant -breast-feeding How should I use this medicine? This medicine is for injection under the skin. If you get this medicine at home, you will be taught how to prepare and give this medicine. Refer to the Instructions for Use that come with your medication packaging. Use exactly as directed. Take your medicine at regular intervals. Do not take your medicine more often than directed. It is important that you put your used needles and syringes in a special sharps container. Do not put them in a trash can. If you do not have a sharps container, call your pharmacist or healthcare provider to get one. Talk to your pediatrician regarding the use of this medicine in children. Special care may be needed. Overdosage: If you think you have taken too much of this medicine contact a poison control center or emergency room at once. NOTE: This medicine is only for you. Do not share this medicine with others. What if I miss a dose? It is important not to miss your dose. Call your doctor or health care professional if you miss a  dose. What may interact with this medicine? This medicine may interact with the following medications: -medicines that may cause a release of neutrophils, such as lithium This list may not describe all possible interactions. Give your health care provider a list of all the medicines, herbs, non-prescription drugs, or dietary supplements you use. Also tell them if you smoke, drink alcohol, or use illegal drugs. Some items may interact with your medicine. What should I watch for while using this medicine? You may need blood work done while you are taking this medicine. What side effects may I notice from receiving this medicine? Side effects that you should report to your doctor or health care professional as soon as possible: -allergic reactions like skin rash, itching or hives, swelling of the face, lips, or tongue -blood in the urine -dark urine -dizziness -fast heartbeat -feeling faint -shortness of breath or breathing problems -signs and symptoms of infection like fever or chills; cough; or sore throat -signs and symptoms of kidney injury like trouble passing urine or change in the amount of urine -stomach or side pain, or pain at the shoulder -sweating -swelling of the legs, ankles, or abdomen -tiredness Side effects that usually do not require medical attention (report to your doctor or health care professional if they continue or are bothersome): -bone pain -headache -muscle pain -vomiting This list may not describe all possible side effects. Call your doctor for medical advice about side effects. You may report side effects to FDA at 1-800-FDA-1088. Where should I keep my medicine? Keep out of the reach of children. Store in a refrigerator between   2 and 8 degrees C (36 and 46 degrees F). Keep in carton to protect from light. Throw away this medicine if it is left out of the refrigerator for more than 5 consecutive days. Throw away any unused medicine after the expiration  date. NOTE: This sheet is a summary. It may not cover all possible information. If you have questions about this medicine, talk to your doctor, pharmacist, or health care provider.  2018 Elsevier/Gold Standard (2015-02-11 19:07:04)  

## 2016-11-17 ENCOUNTER — Other Ambulatory Visit: Payer: Self-pay | Admitting: Adult Health

## 2016-11-17 ENCOUNTER — Ambulatory Visit (HOSPITAL_BASED_OUTPATIENT_CLINIC_OR_DEPARTMENT_OTHER): Payer: Medicaid Other

## 2016-11-17 VITALS — BP 126/71 | HR 107 | Temp 98.6°F | Resp 18

## 2016-11-17 DIAGNOSIS — Z5189 Encounter for other specified aftercare: Secondary | ICD-10-CM

## 2016-11-17 DIAGNOSIS — Z171 Estrogen receptor negative status [ER-]: Secondary | ICD-10-CM

## 2016-11-17 DIAGNOSIS — C50412 Malignant neoplasm of upper-outer quadrant of left female breast: Secondary | ICD-10-CM | POA: Diagnosis not present

## 2016-11-17 DIAGNOSIS — D709 Neutropenia, unspecified: Secondary | ICD-10-CM | POA: Diagnosis present

## 2016-11-17 DIAGNOSIS — Z95828 Presence of other vascular implants and grafts: Secondary | ICD-10-CM

## 2016-11-17 MED ORDER — TBO-FILGRASTIM 300 MCG/0.5ML ~~LOC~~ SOSY
300.0000 ug | PREFILLED_SYRINGE | Freq: Once | SUBCUTANEOUS | Status: AC
  Administered 2016-11-17: 300 ug via SUBCUTANEOUS
  Filled 2016-11-17: qty 0.5

## 2016-11-17 NOTE — Patient Instructions (Signed)
Tbo-Filgrastim injection What is this medicine? TBO-FILGRASTIM (T B O fil GRA stim) is a granulocyte colony-stimulating factor that stimulates the growth of neutrophils, a type of white blood cell important in the body's fight against infection. It is used to reduce the incidence of fever and infection in patients with certain types of cancer who are receiving chemotherapy that affects the bone marrow. This medicine may be used for other purposes; ask your health care provider or pharmacist if you have questions. COMMON BRAND NAME(S): Granix What should I tell my health care provider before I take this medicine? They need to know if you have any of these conditions: -bone scan or tests planned -kidney disease -sickle cell anemia -an unusual or allergic reaction to tbo-filgrastim, filgrastim, pegfilgrastim, other medicines, foods, dyes, or preservatives -pregnant or trying to get pregnant -breast-feeding How should I use this medicine? This medicine is for injection under the skin. If you get this medicine at home, you will be taught how to prepare and give this medicine. Refer to the Instructions for Use that come with your medication packaging. Use exactly as directed. Take your medicine at regular intervals. Do not take your medicine more often than directed. It is important that you put your used needles and syringes in a special sharps container. Do not put them in a trash can. If you do not have a sharps container, call your pharmacist or healthcare provider to get one. Talk to your pediatrician regarding the use of this medicine in children. Special care may be needed. Overdosage: If you think you have taken too much of this medicine contact a poison control center or emergency room at once. NOTE: This medicine is only for you. Do not share this medicine with others. What if I miss a dose? It is important not to miss your dose. Call your doctor or health care professional if you miss a  dose. What may interact with this medicine? This medicine may interact with the following medications: -medicines that may cause a release of neutrophils, such as lithium This list may not describe all possible interactions. Give your health care provider a list of all the medicines, herbs, non-prescription drugs, or dietary supplements you use. Also tell them if you smoke, drink alcohol, or use illegal drugs. Some items may interact with your medicine. What should I watch for while using this medicine? You may need blood work done while you are taking this medicine. What side effects may I notice from receiving this medicine? Side effects that you should report to your doctor or health care professional as soon as possible: -allergic reactions like skin rash, itching or hives, swelling of the face, lips, or tongue -blood in the urine -dark urine -dizziness -fast heartbeat -feeling faint -shortness of breath or breathing problems -signs and symptoms of infection like fever or chills; cough; or sore throat -signs and symptoms of kidney injury like trouble passing urine or change in the amount of urine -stomach or side pain, or pain at the shoulder -sweating -swelling of the legs, ankles, or abdomen -tiredness Side effects that usually do not require medical attention (report to your doctor or health care professional if they continue or are bothersome): -bone pain -headache -muscle pain -vomiting This list may not describe all possible side effects. Call your doctor for medical advice about side effects. You may report side effects to FDA at 1-800-FDA-1088. Where should I keep my medicine? Keep out of the reach of children. Store in a refrigerator between   2 and 8 degrees C (36 and 46 degrees F). Keep in carton to protect from light. Throw away this medicine if it is left out of the refrigerator for more than 5 consecutive days. Throw away any unused medicine after the expiration  date. NOTE: This sheet is a summary. It may not cover all possible information. If you have questions about this medicine, talk to your doctor, pharmacist, or health care provider.  2018 Elsevier/Gold Standard (2015-02-11 19:07:04)  

## 2016-11-18 ENCOUNTER — Ambulatory Visit (HOSPITAL_BASED_OUTPATIENT_CLINIC_OR_DEPARTMENT_OTHER): Payer: Medicaid Other

## 2016-11-18 VITALS — BP 124/89 | HR 98 | Temp 98.2°F | Resp 18

## 2016-11-18 DIAGNOSIS — C50412 Malignant neoplasm of upper-outer quadrant of left female breast: Secondary | ICD-10-CM

## 2016-11-18 DIAGNOSIS — Z5189 Encounter for other specified aftercare: Secondary | ICD-10-CM

## 2016-11-18 DIAGNOSIS — D709 Neutropenia, unspecified: Secondary | ICD-10-CM

## 2016-11-18 DIAGNOSIS — Z95828 Presence of other vascular implants and grafts: Secondary | ICD-10-CM

## 2016-11-18 DIAGNOSIS — Z171 Estrogen receptor negative status [ER-]: Secondary | ICD-10-CM

## 2016-11-18 MED ORDER — TBO-FILGRASTIM 300 MCG/0.5ML ~~LOC~~ SOSY
300.0000 ug | PREFILLED_SYRINGE | Freq: Once | SUBCUTANEOUS | Status: AC
  Administered 2016-11-18: 300 ug via SUBCUTANEOUS
  Filled 2016-11-18: qty 0.5

## 2016-11-18 MED FILL — traZODone HCL 50 MG TABS: 50 | 30 days supply | Qty: 60 | Fill #0

## 2016-11-19 ENCOUNTER — Ambulatory Visit (HOSPITAL_BASED_OUTPATIENT_CLINIC_OR_DEPARTMENT_OTHER): Payer: Medicaid Other

## 2016-11-19 DIAGNOSIS — C50412 Malignant neoplasm of upper-outer quadrant of left female breast: Secondary | ICD-10-CM | POA: Diagnosis not present

## 2016-11-19 DIAGNOSIS — Z5189 Encounter for other specified aftercare: Secondary | ICD-10-CM

## 2016-11-19 DIAGNOSIS — D709 Neutropenia, unspecified: Secondary | ICD-10-CM | POA: Diagnosis present

## 2016-11-19 DIAGNOSIS — Z171 Estrogen receptor negative status [ER-]: Secondary | ICD-10-CM

## 2016-11-19 DIAGNOSIS — Z95828 Presence of other vascular implants and grafts: Secondary | ICD-10-CM

## 2016-11-19 MED ORDER — TBO-FILGRASTIM 300 MCG/0.5ML ~~LOC~~ SOSY
300.0000 ug | PREFILLED_SYRINGE | Freq: Once | SUBCUTANEOUS | Status: AC
  Administered 2016-11-19: 300 ug via SUBCUTANEOUS
  Filled 2016-11-19: qty 0.5

## 2016-11-19 NOTE — Patient Instructions (Signed)
Tbo-Filgrastim injection What is this medicine? TBO-FILGRASTIM (T B O fil GRA stim) is a granulocyte colony-stimulating factor that stimulates the growth of neutrophils, a type of white blood cell important in the body's fight against infection. It is used to reduce the incidence of fever and infection in patients with certain types of cancer who are receiving chemotherapy that affects the bone marrow. This medicine may be used for other purposes; ask your health care provider or pharmacist if you have questions. COMMON BRAND NAME(S): Granix What should I tell my health care provider before I take this medicine? They need to know if you have any of these conditions: -bone scan or tests planned -kidney disease -sickle cell anemia -an unusual or allergic reaction to tbo-filgrastim, filgrastim, pegfilgrastim, other medicines, foods, dyes, or preservatives -pregnant or trying to get pregnant -breast-feeding How should I use this medicine? This medicine is for injection under the skin. If you get this medicine at home, you will be taught how to prepare and give this medicine. Refer to the Instructions for Use that come with your medication packaging. Use exactly as directed. Take your medicine at regular intervals. Do not take your medicine more often than directed. It is important that you put your used needles and syringes in a special sharps container. Do not put them in a trash can. If you do not have a sharps container, call your pharmacist or healthcare provider to get one. Talk to your pediatrician regarding the use of this medicine in children. Special care may be needed. Overdosage: If you think you have taken too much of this medicine contact a poison control center or emergency room at once. NOTE: This medicine is only for you. Do not share this medicine with others. What if I miss a dose? It is important not to miss your dose. Call your doctor or health care professional if you miss a  dose. What may interact with this medicine? This medicine may interact with the following medications: -medicines that may cause a release of neutrophils, such as lithium This list may not describe all possible interactions. Give your health care provider a list of all the medicines, herbs, non-prescription drugs, or dietary supplements you use. Also tell them if you smoke, drink alcohol, or use illegal drugs. Some items may interact with your medicine. What should I watch for while using this medicine? You may need blood work done while you are taking this medicine. What side effects may I notice from receiving this medicine? Side effects that you should report to your doctor or health care professional as soon as possible: -allergic reactions like skin rash, itching or hives, swelling of the face, lips, or tongue -blood in the urine -dark urine -dizziness -fast heartbeat -feeling faint -shortness of breath or breathing problems -signs and symptoms of infection like fever or chills; cough; or sore throat -signs and symptoms of kidney injury like trouble passing urine or change in the amount of urine -stomach or side pain, or pain at the shoulder -sweating -swelling of the legs, ankles, or abdomen -tiredness Side effects that usually do not require medical attention (report to your doctor or health care professional if they continue or are bothersome): -bone pain -headache -muscle pain -vomiting This list may not describe all possible side effects. Call your doctor for medical advice about side effects. You may report side effects to FDA at 1-800-FDA-1088. Where should I keep my medicine? Keep out of the reach of children. Store in a refrigerator between   2 and 8 degrees C (36 and 46 degrees F). Keep in carton to protect from light. Throw away this medicine if it is left out of the refrigerator for more than 5 consecutive days. Throw away any unused medicine after the expiration  date. NOTE: This sheet is a summary. It may not cover all possible information. If you have questions about this medicine, talk to your doctor, pharmacist, or health care provider.  2018 Elsevier/Gold Standard (2015-02-11 19:07:04)  

## 2016-11-23 ENCOUNTER — Other Ambulatory Visit (HOSPITAL_BASED_OUTPATIENT_CLINIC_OR_DEPARTMENT_OTHER): Payer: Medicaid Other

## 2016-11-23 ENCOUNTER — Ambulatory Visit (HOSPITAL_BASED_OUTPATIENT_CLINIC_OR_DEPARTMENT_OTHER): Payer: Medicaid Other

## 2016-11-23 ENCOUNTER — Telehealth: Payer: Self-pay | Admitting: Adult Health

## 2016-11-23 ENCOUNTER — Encounter: Payer: Self-pay | Admitting: Adult Health

## 2016-11-23 ENCOUNTER — Ambulatory Visit (HOSPITAL_BASED_OUTPATIENT_CLINIC_OR_DEPARTMENT_OTHER): Payer: Medicaid Other | Admitting: Adult Health

## 2016-11-23 ENCOUNTER — Ambulatory Visit: Payer: Medicaid Other

## 2016-11-23 ENCOUNTER — Other Ambulatory Visit: Payer: Self-pay | Admitting: Oncology

## 2016-11-23 VITALS — BP 121/93 | HR 111 | Temp 98.4°F | Resp 20 | Ht 67.5 in | Wt 258.5 lb

## 2016-11-23 DIAGNOSIS — C50412 Malignant neoplasm of upper-outer quadrant of left female breast: Secondary | ICD-10-CM

## 2016-11-23 DIAGNOSIS — Z171 Estrogen receptor negative status [ER-]: Secondary | ICD-10-CM | POA: Diagnosis not present

## 2016-11-23 DIAGNOSIS — Z95828 Presence of other vascular implants and grafts: Secondary | ICD-10-CM

## 2016-11-23 DIAGNOSIS — Z5111 Encounter for antineoplastic chemotherapy: Secondary | ICD-10-CM

## 2016-11-23 DIAGNOSIS — C773 Secondary and unspecified malignant neoplasm of axilla and upper limb lymph nodes: Secondary | ICD-10-CM | POA: Diagnosis not present

## 2016-11-23 DIAGNOSIS — R399 Unspecified symptoms and signs involving the genitourinary system: Secondary | ICD-10-CM

## 2016-11-23 LAB — COMPREHENSIVE METABOLIC PANEL
ALBUMIN: 3.6 g/dL (ref 3.5–5.0)
ALK PHOS: 91 U/L (ref 40–150)
ALT: 25 U/L (ref 0–55)
AST: 23 U/L (ref 5–34)
Anion Gap: 11 mEq/L (ref 3–11)
BILIRUBIN TOTAL: 0.66 mg/dL (ref 0.20–1.20)
BUN: 6.5 mg/dL — AB (ref 7.0–26.0)
CO2: 28 mEq/L (ref 22–29)
CREATININE: 0.8 mg/dL (ref 0.6–1.1)
Calcium: 9.3 mg/dL (ref 8.4–10.4)
Chloride: 101 mEq/L (ref 98–109)
EGFR: >60 mL/min/{1.73_m2} (ref >60)
GLUCOSE: 108 mg/dL (ref 70–140)
Potassium: 3.4 mEq/L — ABNORMAL LOW (ref 3.5–5.1)
SODIUM: 139 meq/L (ref 136–145)
TOTAL PROTEIN: 7.1 g/dL (ref 6.4–8.3)

## 2016-11-23 LAB — URINALYSIS, MICROSCOPIC - CHCC
BILIRUBIN (URINE): NEGATIVE
BLOOD: NEGATIVE
Glucose: NEGATIVE mg/dL
KETONES: NEGATIVE mg/dL
Leukocyte Esterase: NEGATIVE
Nitrite: NEGATIVE
PH: 6 (ref 4.6–8.0)
Protein: NEGATIVE mg/dL
RBC / HPF: NEGATIVE (ref 0–2)
Specific Gravity, Urine: 1.025 (ref 1.003–1.035)
Urobilinogen, UR: 0.2 mg/dL (ref 0.2–1)

## 2016-11-23 LAB — CBC WITH DIFFERENTIAL/PLATELET
BASO%: 0.8 % (ref 0.0–2.0)
Basophils Absolute: 0 10*3/uL (ref 0.0–0.1)
EOS ABS: 0 10*3/uL (ref 0.0–0.5)
EOS%: 0.2 % (ref 0.0–7.0)
HCT: 32 % — ABNORMAL LOW (ref 34.8–46.6)
HEMOGLOBIN: 10.7 g/dL — AB (ref 11.6–15.9)
LYMPH%: 33.7 % (ref 14.0–49.7)
MCH: 31.4 pg (ref 25.1–34.0)
MCHC: 33.5 g/dL (ref 31.5–36.0)
MCV: 93.8 fL (ref 79.5–101.0)
MONO#: 0.5 10*3/uL (ref 0.1–0.9)
MONO%: 17.9 % — AB (ref 0.0–14.0)
NEUT%: 47.4 % (ref 38.4–76.8)
NEUTROS ABS: 1.4 10*3/uL — AB (ref 1.5–6.5)
Platelets: 165 10*3/uL (ref 145–400)
RBC: 3.41 10*6/uL — ABNORMAL LOW (ref 3.70–5.45)
RDW: 18.3 % — AB (ref 11.2–14.5)
WBC: 3 10*3/uL — AB (ref 3.9–10.3)
lymph#: 1 10*3/uL (ref 0.9–3.3)

## 2016-11-23 MED ORDER — SODIUM CHLORIDE 0.9% FLUSH
10.0000 mL | Freq: Once | INTRAVENOUS | Status: AC
  Administered 2016-11-23: 10 mL
  Filled 2016-11-23: qty 10

## 2016-11-23 MED ORDER — SODIUM CHLORIDE 0.9% FLUSH
10.0000 mL | INTRAVENOUS | Status: DC | PRN
  Administered 2016-11-23: 10 mL
  Filled 2016-11-23: qty 10

## 2016-11-23 MED ORDER — SODIUM CHLORIDE 0.9 % IV SOLN
Freq: Once | INTRAVENOUS | Status: AC
Start: 2016-11-23 — End: 2016-11-23
  Administered 2016-11-23: 15:00:00 via INTRAVENOUS

## 2016-11-23 MED ORDER — DIPHENHYDRAMINE HCL 50 MG/ML IJ SOLN
INTRAMUSCULAR | Status: AC
  Filled 2016-11-23: qty 1

## 2016-11-23 MED ORDER — SODIUM CHLORIDE 0.9 % IV SOLN
20.0000 mg | Freq: Once | INTRAVENOUS | Status: AC
  Administered 2016-11-23: 20 mg via INTRAVENOUS
  Filled 2016-11-23: qty 2

## 2016-11-23 MED ORDER — FAMOTIDINE IN NACL 20-0.9 MG/50ML-% IV SOLN
INTRAVENOUS | Status: AC
  Filled 2016-11-23: qty 50

## 2016-11-23 MED ORDER — SODIUM CHLORIDE 0.9 % IV SOLN
80.0000 mg/m2 | Freq: Once | INTRAVENOUS | Status: AC
  Administered 2016-11-23: 186 mg via INTRAVENOUS
  Filled 2016-11-23: qty 31

## 2016-11-23 MED ORDER — PALONOSETRON HCL INJECTION 0.25 MG/5ML
INTRAVENOUS | Status: AC
  Filled 2016-11-23: qty 5

## 2016-11-23 MED ORDER — HEPARIN SOD (PORK) LOCK FLUSH 100 UNIT/ML IV SOLN
500.0000 [IU] | Freq: Once | INTRAVENOUS | Status: AC | PRN
  Administered 2016-11-23: 500 [IU]
  Filled 2016-11-23: qty 5

## 2016-11-23 MED ORDER — SODIUM CHLORIDE 0.9 % IV SOLN
300.0000 mg | Freq: Once | INTRAVENOUS | Status: AC
  Administered 2016-11-23: 300 mg via INTRAVENOUS
  Filled 2016-11-23: qty 30

## 2016-11-23 MED ORDER — DIPHENHYDRAMINE HCL 50 MG/ML IJ SOLN
25.0000 mg | Freq: Once | INTRAMUSCULAR | Status: AC
  Administered 2016-11-23: 25 mg via INTRAVENOUS

## 2016-11-23 MED ORDER — PALONOSETRON HCL INJECTION 0.25 MG/5ML
0.2500 mg | Freq: Once | INTRAVENOUS | Status: AC
  Administered 2016-11-23: 0.25 mg via INTRAVENOUS

## 2016-11-23 MED ORDER — FAMOTIDINE IN NACL 20-0.9 MG/50ML-% IV SOLN
20.0000 mg | Freq: Once | INTRAVENOUS | Status: AC
  Administered 2016-11-23: 20 mg via INTRAVENOUS

## 2016-11-23 NOTE — Telephone Encounter (Signed)
Added appts for 11/24 and 11/26 per 11/19 los. All other appointments remain the same. Spoke with patient she is aware. Patient declined printout.

## 2016-11-23 NOTE — Progress Notes (Signed)
[place]  Telephone:([phone]) [phone] Fax:(336) [phone]     ID: [name] DOB: 1970/04/17  MR#: [id number]  SAY#:[id number]  Patient Care Team: Sarah Pier, MD as PCP - General (Internal Medicine) Sarah Skates, MD as Consulting Physician (General Surgery) Khan, Sarah Dad, MD as Consulting Physician (Oncology) Sarah Gibson, MD as Attending Physician (Radiation Oncology) OTHER MD:  CHIEF COMPLAINT: Triple negative breast cancer  CURRENT TREATMENT: Neoadjuvant chemotherapy  INTERVAL HISTORY: [name] returns today for follow-up and treatment of her triple negative breast cancer. She is accompanied by her friend. She is currently receiving neoadjuvant chemotherapy and today is day 1 cycle 6 of 12 weekly paclitaxel and carboplatin dose as planned. Her chemotherapy was recently delayed due to neutropenia.  REVIEW OF SYSTEMS: [name] received four days of Granix last week.  She did have some bone pain after the third injection that resolved with tylenol.  She is fatigued.  She has a mild intermittent numbness in her right fingertips, however none anywhere else.  Otherwise a detailed ROS is non contributory.    BREAST CANCER HISTORY: From the original intake note:  The patient was seen in the emergency room 05/16/2016 with nonspecific complaints but also noting that she had a tender lump in her lateral left breast which she said she had noted the day before. Exam by the emergency room physician confirmed a 3 cm soft left lateral breast mass without overlying erythema or nipple changes. This was felt to be most consistent with fibrocystic change but the patient was referred back to her primary care physician for further evaluation. She saw the physician assistant in May 18 and then Dr. Wynetta Khan on 06/12/2016 who scheduled a bilateral diagnostic mammography with tomography and left breast ultrasonography at the [facility] 06/18/2016. This found the breast density to be  category B. In the left breast upper outer quadrant there was an area of asymmetry measuring up to 10.3 cm. On exam this was firm and palpable and on ultrasound there was an irregular mass measuring 2.5 cm with some subtle changes in the surrounding tissue. In the left lower axilla there were 2 suspicious-looking lymph nodes.  Biopsy of the left breast upper outer quadrant and one of the suspicious lymph nodes 06/19/2016 showed ([id number]) both to be involved by invasive ductal carcinoma, grade 3, estrogen and progesterone receptor negative, HER-2 not amplified with a signals ratio being 1.53-1.74 and the number per cell 2.35-3.05. The MIB-1 was 90%.  The patient's subsequent history is as detailed below.   PAST MEDICAL HISTORY: Past Medical History:  Diagnosis Date  . Breast cancer ([place])    lf breast  . Malignant neoplasm of upper-outer quadrant of left breast in female, estrogen receptor negative ([place]) 06/23/2016    PAST SURGICAL HISTORY: Past Surgical History:  Procedure Laterality Date  . ANKLE SURGERY    . IR FLUORO GUIDE PORT INSERTION RIGHT  07/14/2016  . IR US GUIDE VASC ACCESS RIGHT  07/14/2016  . TUBAL LIGATION      FAMILY HISTORY Family History  Problem Relation Age of Onset  . Diabetes Mother   . Hypertension Mother   . Arthritis Mother   . Colon cancer Father 27       d.76 metastatic at time of diagnosis  . Diabetes Father   . Heart disease Father   . Hypertension Father   . Breast cancer Maternal Grandmother 4       d.60s  . Breast cancer Maternal Aunt 52  .  Cervical cancer Maternal Aunt 104  . Breast cancer Maternal Aunt 46       d.50s  . Cancer Maternal Uncle        d.62s unspecified type of cancer  . Breast cancer Cousin 10       paternal first-cousin (daughter of unaffected aunt)  . Cancer Maternal Aunt 4       "Female Cancer"  . Cancer Maternal Aunt        unknown cancer  The patient's father died at age 35 from colon cancer which she never had  treated. The patient's mother is living, 67 years old as of June 2018. The patient has one brother, one sister. On the mother's side there is a history of breast cancer in the grandmother, age 42, and then 1 aunt with breast cancer age 85, and another with lung cancer age 55. There is no history of ovarian cancer in the family.  GYNECOLOGIC HISTORY:  No LMP recorded. Menarche age 77, first live birth age 84. She is GX P4. Her periods have never been regular. She had a period in June 2018 but not for 4 months before that. She took birth control for some years remotely with no complications. She is status post bilateral tubal ligation.  SOCIAL HISTORY:  The patient's husband owns and runs a food truck in the area. They serve mostly [place] and [place] food. 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 Her husband [name] (goes by "[name]") is originally from [name]. The patient has two children from her first marriage. [name] "[name], who is 47 years old, as of June 2018, who was recently in the [employer], but now is taking courses at [employer] and [name], who is studying psychology at [name]. Together with [name] the patient has two children who are [name] and [name], aged 63 and 60.     ADVANCED DIRECTIVES: Not in place   HEALTH MAINTENANCE: Social History   Tobacco Use  . Smoking status: Never Smoker  . Smokeless tobacco: Never Used  Substance Use Topics  . Alcohol use: No  . Drug use: No     Colonoscopy:Never   CWU:GQBV 2018  Bone density:Never   No Known Allergies  Current Outpatient Medications  Medication Sig Dispense Refill  . acetaminophen (TYLENOL) 500 MG tablet Take 2 tablets (1,000 mg total) by mouth every 6 (six) hours as needed for mild pain. For fever/pain    . docusate sodium (COLACE) 100 MG capsule Take 1 capsule (100 mg total) by mouth 2 (two) times daily. 60 capsule  2  . lidocaine-prilocaine (EMLA) cream Apply to affected area once 30 g 3  . loratadine (CLARITIN) 10 MG tablet Take 10 mg by mouth daily. On chemo days    . prochlorperazine (COMPAZINE) 10 MG tablet Take 10 mg by mouth every 6 (six) hours as needed for nausea or vomiting. On day of and three days after chemo    . traZODone (DESYREL) 50 MG tablet Take 100 mg by mouth at bedtime as needed for sleep.    . traZODone (DESYREL) 50 MG tablet TAKE 1 TABLET BY MOUTH AT BEDTIME AS NEEDED FOR SLEEP FOR 2 NIGHTS, THEN OK TO INCREASE TO 2 TABLETS EACH NIGHT AT BEDTIME 60 tablet 0   No current facility-administered medications for this visit.     OBJECTIVE:   Vitals:   11/23/16 1339  BP: (!) 121/93  Pulse: Marland Kitchen)  111  Resp: 20  Temp: 98.4 F (36.9 C)  SpO2: 100%     Body mass index is 39.89 kg/m.   Filed Weights   11/23/16 1339  Weight: 258 lb 8 oz (117.3 kg)   ECOG FS:1 - Symptomatic but completely ambulatory GENERAL: Patient is a well appearing female in no acute distress HEENT:  Sclerae anicteric.  Oropharynx clear and moist. No ulcerations or evidence of oropharyngeal candidiasis. Neck is supple.  NODES:  No cervical, supraclavicular, or axillary lymphadenopathy palpated.  BREAST EXAM:  Deferred. LUNGS:  Clear to auscultation bilaterally.  No wheezes or rhonchi. HEART:  Regular rate and rhythm. No murmur appreciated. ABDOMEN:  Soft, nontender.  Positive, normoactive bowel sounds. No organomegaly palpated. MSK:  No focal spinal tenderness to palpation. Full range of motion bilaterally in the upper extremities. EXTREMITIES:  No peripheral edema.   SKIN:  Clear with no obvious rashes or skin changes. No nail dyscrasia. NEURO:  Nonfocal. Well oriented.  Appropriate affect.    LAB RESULTS:  CMP     Component Value Date/Time   NA 138 11/16/2016 1402   K 3.6 11/16/2016 1402   CL 105 10/26/2016 0409   CO2 24 11/16/2016 1402   GLUCOSE 160 (H) 11/16/2016 1402   BUN 7.2 11/16/2016 1402    CREATININE 0.8 11/16/2016 1402   CALCIUM 9.4 11/16/2016 1402   PROT 7.1 11/16/2016 1402   ALBUMIN 3.6 11/16/2016 1402   AST 28 11/16/2016 1402   ALT 40 11/16/2016 1402   ALKPHOS 91 11/16/2016 1402   BILITOT 0.91 11/16/2016 1402   GFRNONAA >60 10/26/2016 0409   GFRAA >60 10/26/2016 0409    No results found for: TOTALPROTELP, ALBUMINELP, A1GS, A2GS, BETS, BETA2SER, GAMS, MSPIKE, SPEI  No results found for: Nils Pyle, KAPLAMBRATIO  Lab Results  Component Value Date   WBC 3.0 (L) 11/23/2016   NEUTROABS 1.4 (L) 11/23/2016   HGB 10.7 (L) 11/23/2016   HCT 32.0 (L) 11/23/2016   MCV 93.8 11/23/2016   PLT 165 11/23/2016      Chemistry      Component Value Date/Time   NA 138 11/16/2016 1402   K 3.6 11/16/2016 1402   CL 105 10/26/2016 0409   CO2 24 11/16/2016 1402   BUN 7.2 11/16/2016 1402   CREATININE 0.8 11/16/2016 1402      Component Value Date/Time   CALCIUM 9.4 11/16/2016 1402   ALKPHOS 91 11/16/2016 1402   AST 28 11/16/2016 1402   ALT 40 11/16/2016 1402   BILITOT 0.91 11/16/2016 1402       No results found for: LABCA2  No components found for: [id number]  No results for input(s): INR in the last 168 hours.  Urinalysis    Component Value Date/Time   COLORURINE YELLOW 10/24/2016 1621   APPEARANCEUR CLEAR 10/24/2016 1621   LABSPEC >1.030 (H) 10/24/2016 1621   PHURINE 5.5 10/24/2016 1621   GLUCOSEU NEGATIVE 10/24/2016 1621   HGBUR NEGATIVE 10/24/2016 1621   BILIRUBINUR NEGATIVE 10/24/2016 1621   KETONESUR NEGATIVE 10/24/2016 1621   PROTEINUR NEGATIVE 10/24/2016 1621   UROBILINOGEN 0.2 01/04/2010 1911   NITRITE NEGATIVE 10/24/2016 1621   LEUKOCYTESUR NEGATIVE 10/24/2016 1621     STUDIES: Dg Chest 2 View  Result Date: 10/24/2016 CLINICAL DATA:  Pain at Port-A-Cath site.  Left breast cancer. EXAM: CHEST  2 VIEW COMPARISON:  08/26/2016 FINDINGS: Right-sided Port-A-Cath is unchanged in position, with tip projecting over the mid SVC. Minimal  right hemidiaphragm elevation. Midline trachea. Normal heart size  and mediastinal contours. No pleural effusion or pneumothorax. Mildly low lung volumes. Clear lungs. IMPRESSION: No acute cardiopulmonary disease. Electronically Signed   By: Abigail Miyamoto M.D.   On: 10/24/2016 15:26    ELIGIBLE FOR AVAILABLE RESEARCH PROTOCOL: no   ASSESSMENT: 46 y.o. 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 Additional testing of the patient's mother and maternal aunt is pending. Otherwise [facility]'s Common Hereditary Cancers Panel found no deleterious mutations in APC, ATM, AXIN2, BARD1, BMPR1A, BRCA1, BRCA2, BRIP1, CDH1, CDKN2A, CHEK2, CTNNA1, DICER1, EPCAM, GREM1, HOXB13, KIT, MEN1, MLH1, MSH2, MSH3, MSH6, MUTYH, NBN, NF1, NTHL1, PALB2, PDGFRA, PMS2, POLD1, POLE, PTEN, RAD50, RAD51C, RAD51D, SDHA, SDHB, SDHC, SDHD, SMAD4, SMARCA4, STK11, TP53, TSC1, TSC2, and VHL.  (2) neoadjuvant chemotherapy to consist of doxorubicin and cyclophosphamide in dose dense fashion 4 completed 09/10/2016 to be followed by paclitaxel weekly 12 given with carboplatin   (a) cycle 4 of cyclophosphamide and doxorubicin was delayed 10 days and dose decreased 10% because of febrile neutropenia after cycle 3  (b) cycle 6 of Paclitaxel and Carboplatin delayed due to neutropenia, therefore Granix added to Wednesday, Thursday, Friday following chemotherapy days  (3) definitive surgery to follow  (4) adjuvant radiation to follow surgery  PLAN:  Adler is doing well today.  Her ANC is 1.4.  She will proceed with chemotherapy today.  I reviewed her labs  with [name].  We will not reduce her dosages of chemotherapy at this point.  We will give her three days of Granix afterwards and hopefully keep her on schedule.  She will return next week and we will see where her labs are.  I reviewed this plan with Zephaniah and she is in agreement.   Kamyah will return in one week for labs, f/u and her seventh cycle of chemotherapy.  Her Granix will be Wednesday, Friday, Saturday due to the Thanksgiving Holiday this week.    A total of (20) minutes of face-to-face time was spent with this patient with greater than 50% of that time in counseling and care-coordination.   Wilber Bihari, NP  11/23/16 1:51 PM Medical Oncology and Hematology [facility] [address] [place], [place] [address] Tel. [phone]    Fax. [phone]

## 2016-11-23 NOTE — Patient Instructions (Signed)
   [facility] Discharge Instructions for Patients Receiving Chemotherapy  Today you received the following chemotherapy agents Taxol and Carboplatin   To help prevent nausea and vomiting after your treatment, we encourage you to take your nausea medication as directed.    If you develop nausea and vomiting that is not controlled by your nausea medication, call the clinic.   BELOW ARE SYMPTOMS THAT SHOULD BE REPORTED IMMEDIATELY:  *FEVER GREATER THAN 100.5 F  *CHILLS WITH OR WITHOUT FEVER  NAUSEA AND VOMITING THAT IS NOT CONTROLLED WITH YOUR NAUSEA MEDICATION  *UNUSUAL SHORTNESS OF BREATH  *UNUSUAL BRUISING OR BLEEDING  TENDERNESS IN MOUTH AND THROAT WITH OR WITHOUT PRESENCE OF ULCERS  *URINARY PROBLEMS  *BOWEL PROBLEMS  UNUSUAL RASH Items with * indicate a potential emergency and should be followed up as soon as possible.  Feel free to call the clinic should you have any questions or concerns. The clinic phone number is [phone].  Please show the CHEMO ALERT CARD at check-in to the Emergency Department and triage nurse.   

## 2016-11-23 NOTE — Progress Notes (Signed)
Per Wilber Bihari, NP, patient ok to treat with current [place].

## 2016-11-24 ENCOUNTER — Telehealth: Payer: Self-pay | Admitting: *Deleted

## 2016-11-24 NOTE — Telephone Encounter (Signed)
Per request of Wilber Bihari, NP, notified patient of results from Urinalysis as normal.  Patient expressed thanks and complete understanding.

## 2016-11-25 ENCOUNTER — Ambulatory Visit (HOSPITAL_BASED_OUTPATIENT_CLINIC_OR_DEPARTMENT_OTHER): Payer: Medicaid Other

## 2016-11-25 VITALS — BP 112/79 | HR 99 | Temp 98.1°F | Resp 18

## 2016-11-25 DIAGNOSIS — Z5189 Encounter for other specified aftercare: Secondary | ICD-10-CM

## 2016-11-25 DIAGNOSIS — Z95828 Presence of other vascular implants and grafts: Secondary | ICD-10-CM

## 2016-11-25 DIAGNOSIS — C50412 Malignant neoplasm of upper-outer quadrant of left female breast: Secondary | ICD-10-CM

## 2016-11-25 DIAGNOSIS — D709 Neutropenia, unspecified: Secondary | ICD-10-CM | POA: Diagnosis not present

## 2016-11-25 DIAGNOSIS — Z171 Estrogen receptor negative status [ER-]: Secondary | ICD-10-CM

## 2016-11-25 LAB — URINE CULTURE

## 2016-11-25 MED ORDER — TBO-FILGRASTIM 300 MCG/0.5ML ~~LOC~~ SOSY
300.0000 ug | PREFILLED_SYRINGE | Freq: Once | SUBCUTANEOUS | Status: AC
  Administered 2016-11-25: 300 ug via SUBCUTANEOUS
  Filled 2016-11-25: qty 0.5

## 2016-11-27 ENCOUNTER — Ambulatory Visit (HOSPITAL_BASED_OUTPATIENT_CLINIC_OR_DEPARTMENT_OTHER): Payer: Medicaid Other

## 2016-11-27 VITALS — BP 128/76 | HR 107 | Temp 98.3°F | Resp 18

## 2016-11-27 DIAGNOSIS — Z171 Estrogen receptor negative status [ER-]: Secondary | ICD-10-CM

## 2016-11-27 DIAGNOSIS — D709 Neutropenia, unspecified: Secondary | ICD-10-CM | POA: Diagnosis not present

## 2016-11-27 DIAGNOSIS — Z5189 Encounter for other specified aftercare: Secondary | ICD-10-CM | POA: Diagnosis not present

## 2016-11-27 DIAGNOSIS — Z95828 Presence of other vascular implants and grafts: Secondary | ICD-10-CM

## 2016-11-27 DIAGNOSIS — C50412 Malignant neoplasm of upper-outer quadrant of left female breast: Secondary | ICD-10-CM | POA: Diagnosis not present

## 2016-11-27 MED ORDER — TBO-FILGRASTIM 300 MCG/0.5ML ~~LOC~~ SOSY
300.0000 ug | PREFILLED_SYRINGE | Freq: Once | SUBCUTANEOUS | Status: AC
  Administered 2016-11-27: 300 ug via SUBCUTANEOUS
  Filled 2016-11-27: qty 0.5

## 2016-11-28 ENCOUNTER — Ambulatory Visit (HOSPITAL_BASED_OUTPATIENT_CLINIC_OR_DEPARTMENT_OTHER): Payer: Medicaid Other

## 2016-11-28 VITALS — BP 121/88 | HR 90 | Temp 98.1°F | Resp 16

## 2016-11-28 DIAGNOSIS — C50412 Malignant neoplasm of upper-outer quadrant of left female breast: Secondary | ICD-10-CM

## 2016-11-28 DIAGNOSIS — D701 Agranulocytosis secondary to cancer chemotherapy: Secondary | ICD-10-CM

## 2016-11-28 DIAGNOSIS — Z171 Estrogen receptor negative status [ER-]: Principal | ICD-10-CM

## 2016-11-28 MED ORDER — TBO-FILGRASTIM 300 MCG/0.5ML ~~LOC~~ SOSY
300.0000 ug | PREFILLED_SYRINGE | Freq: Once | SUBCUTANEOUS | Status: AC
  Administered 2016-11-28: 300 ug via SUBCUTANEOUS

## 2016-11-30 ENCOUNTER — Ambulatory Visit (HOSPITAL_BASED_OUTPATIENT_CLINIC_OR_DEPARTMENT_OTHER): Payer: Medicaid Other | Admitting: Adult Health

## 2016-11-30 ENCOUNTER — Telehealth: Payer: Self-pay | Admitting: *Deleted

## 2016-11-30 ENCOUNTER — Encounter: Payer: Self-pay | Admitting: Adult Health

## 2016-11-30 ENCOUNTER — Ambulatory Visit: Payer: Medicaid Other

## 2016-11-30 ENCOUNTER — Other Ambulatory Visit (HOSPITAL_BASED_OUTPATIENT_CLINIC_OR_DEPARTMENT_OTHER): Payer: Medicaid Other

## 2016-11-30 ENCOUNTER — Ambulatory Visit (HOSPITAL_BASED_OUTPATIENT_CLINIC_OR_DEPARTMENT_OTHER): Payer: Medicaid Other

## 2016-11-30 ENCOUNTER — Telehealth: Payer: Self-pay | Admitting: Adult Health

## 2016-11-30 VITALS — BP 110/83 | HR 90 | Temp 98.7°F | Resp 16 | Ht 67.5 in | Wt 259.0 lb

## 2016-11-30 DIAGNOSIS — D709 Neutropenia, unspecified: Secondary | ICD-10-CM

## 2016-11-30 DIAGNOSIS — C50412 Malignant neoplasm of upper-outer quadrant of left female breast: Secondary | ICD-10-CM | POA: Diagnosis not present

## 2016-11-30 DIAGNOSIS — Z5189 Encounter for other specified aftercare: Secondary | ICD-10-CM

## 2016-11-30 DIAGNOSIS — C773 Secondary and unspecified malignant neoplasm of axilla and upper limb lymph nodes: Secondary | ICD-10-CM | POA: Diagnosis not present

## 2016-11-30 DIAGNOSIS — Z95828 Presence of other vascular implants and grafts: Secondary | ICD-10-CM

## 2016-11-30 DIAGNOSIS — Z5111 Encounter for antineoplastic chemotherapy: Secondary | ICD-10-CM

## 2016-11-30 DIAGNOSIS — Z171 Estrogen receptor negative status [ER-]: Principal | ICD-10-CM

## 2016-11-30 LAB — COMPREHENSIVE METABOLIC PANEL
ALK PHOS: 94 U/L (ref 40–150)
ALT: 34 U/L (ref 0–55)
AST: 24 U/L (ref 5–34)
Albumin: 3.5 g/dL (ref 3.5–5.0)
Anion Gap: 8 mEq/L (ref 3–11)
BILIRUBIN TOTAL: 0.62 mg/dL (ref 0.20–1.20)
BUN: 7.6 mg/dL (ref 7.0–26.0)
CHLORIDE: 102 meq/L (ref 98–109)
CO2: 27 meq/L (ref 22–29)
Calcium: 9.1 mg/dL (ref 8.4–10.4)
Creatinine: 0.9 mg/dL (ref 0.6–1.1)
GLUCOSE: 98 mg/dL (ref 70–140)
Potassium: 4 mEq/L (ref 3.5–5.1)
SODIUM: 138 meq/L (ref 136–145)
TOTAL PROTEIN: 6.8 g/dL (ref 6.4–8.3)

## 2016-11-30 LAB — CBC WITH DIFFERENTIAL/PLATELET
BASO%: 0.3 % (ref 0.0–2.0)
BASOS ABS: 0 10*3/uL (ref 0.0–0.1)
EOS ABS: 0 10*3/uL (ref 0.0–0.5)
EOS%: 0.7 % (ref 0.0–7.0)
HEMATOCRIT: 28.3 % — AB (ref 34.8–46.6)
HGB: 9.4 g/dL — ABNORMAL LOW (ref 11.6–15.9)
LYMPH#: 0.7 10*3/uL — AB (ref 0.9–3.3)
LYMPH%: 22.6 % (ref 14.0–49.7)
MCH: 31.5 pg (ref 25.1–34.0)
MCHC: 33.2 g/dL (ref 31.5–36.0)
MCV: 95 fL (ref 79.5–101.0)
MONO#: 0.2 10*3/uL (ref 0.1–0.9)
MONO%: 6.3 % (ref 0.0–14.0)
NEUT#: 2.1 10*3/uL (ref 1.5–6.5)
NEUT%: 70.1 % (ref 38.4–76.8)
PLATELETS: 77 10*3/uL — AB (ref 145–400)
RBC: 2.98 10*6/uL — AB (ref 3.70–5.45)
RDW: 16 % — ABNORMAL HIGH (ref 11.2–14.5)
WBC: 3 10*3/uL — ABNORMAL LOW (ref 3.9–10.3)

## 2016-11-30 MED ORDER — SODIUM CHLORIDE 0.9 % IV SOLN
80.0000 mg/m2 | Freq: Once | INTRAVENOUS | Status: AC
  Administered 2016-11-30: 186 mg via INTRAVENOUS
  Filled 2016-11-30: qty 31

## 2016-11-30 MED ORDER — FAMOTIDINE IN NACL 20-0.9 MG/50ML-% IV SOLN
INTRAVENOUS | Status: AC
  Filled 2016-11-30: qty 50

## 2016-11-30 MED ORDER — FAMOTIDINE IN NACL 20-0.9 MG/50ML-% IV SOLN
20.0000 mg | Freq: Once | INTRAVENOUS | Status: AC
  Administered 2016-11-30: 20 mg via INTRAVENOUS

## 2016-11-30 MED ORDER — PEGFILGRASTIM 6 MG/0.6ML ~~LOC~~ PSKT
6.0000 mg | PREFILLED_SYRINGE | Freq: Once | SUBCUTANEOUS | Status: AC
  Administered 2016-11-30: 6 mg via SUBCUTANEOUS
  Filled 2016-11-30: qty 0.6

## 2016-11-30 MED ORDER — PALONOSETRON HCL INJECTION 0.25 MG/5ML
INTRAVENOUS | Status: AC
Start: 2016-11-30 — End: 2016-11-30
  Filled 2016-11-30: qty 5

## 2016-11-30 MED ORDER — SODIUM CHLORIDE 0.9 % IV SOLN
Freq: Once | INTRAVENOUS | Status: AC
  Administered 2016-11-30: 16:00:00 via INTRAVENOUS

## 2016-11-30 MED ORDER — PALONOSETRON HCL INJECTION 0.25 MG/5ML
0.2500 mg | Freq: Once | INTRAVENOUS | Status: AC
  Administered 2016-11-30: 0.25 mg via INTRAVENOUS

## 2016-11-30 MED ORDER — SODIUM CHLORIDE 0.9% FLUSH
10.0000 mL | Freq: Once | INTRAVENOUS | Status: AC
  Administered 2016-11-30: 10 mL
  Filled 2016-11-30: qty 10

## 2016-11-30 MED ORDER — DIPHENHYDRAMINE HCL 50 MG/ML IJ SOLN
25.0000 mg | Freq: Once | INTRAMUSCULAR | Status: AC
  Administered 2016-11-30: 25 mg via INTRAVENOUS

## 2016-11-30 MED ORDER — DIPHENHYDRAMINE HCL 50 MG/ML IJ SOLN
INTRAMUSCULAR | Status: AC
  Filled 2016-11-30: qty 1

## 2016-11-30 MED ORDER — SODIUM CHLORIDE 0.9% FLUSH
10.0000 mL | INTRAVENOUS | Status: DC | PRN
  Administered 2016-11-30: 10 mL
  Filled 2016-11-30: qty 10

## 2016-11-30 MED ORDER — HEPARIN SOD (PORK) LOCK FLUSH 100 UNIT/ML IV SOLN
500.0000 [IU] | Freq: Once | INTRAVENOUS | Status: AC | PRN
  Administered 2016-11-30: 500 [IU]
  Filled 2016-11-30: qty 5

## 2016-11-30 MED ORDER — SODIUM CHLORIDE 0.9 % IV SOLN
20.0000 mg | Freq: Once | INTRAVENOUS | Status: AC
  Administered 2016-11-30: 20 mg via INTRAVENOUS
  Filled 2016-11-30: qty 2

## 2016-11-30 MED ORDER — SODIUM CHLORIDE 0.9 % IV SOLN
300.0000 mg | Freq: Once | INTRAVENOUS | Status: AC
  Administered 2016-11-30: 300 mg via INTRAVENOUS
  Filled 2016-11-30: qty 30

## 2016-11-30 NOTE — Telephone Encounter (Signed)
Patient scheduled per 11/26 los. Patient declined AVS and calendar.

## 2016-11-30 NOTE — Telephone Encounter (Signed)
OK to treat with PLT of 77 per Dr [name]

## 2016-11-30 NOTE — Progress Notes (Signed)
CBC/CMET reviewed by Mendel Ryder, NP VO " Ok to treat despite labs"

## 2016-11-30 NOTE — Progress Notes (Signed)
[place]  [phone] Fax:([phone]) [phone]     ID: [name] DOB: [birth year]  MR#: [id number]  RCV#:[id number]  Patient Care Team: Sarah Pier, MD as PCP - General (Internal Medicine) Sarah Skates, MD as Consulting Physician (General Surgery) Khan, Sarah Dad, MD as Consulting Physician (Oncology) Sarah Gibson, MD as Attending Physician (Radiation Oncology) OTHER MD:  CHIEF COMPLAINT: Triple negative breast cancer  CURRENT TREATMENT: Neoadjuvant chemotherapy  INTERVAL HISTORY: [name] returns today for follow-up and treatment of her triple negative breast cancer. She is accompanied by her friend. She is currently receiving neoadjuvant chemotherapy and today is day 1 cycle 7 of 12 weekly paclitaxel and carboplatin dose as planned.   REVIEW OF SYSTEMS: [name] is doing well today.  She received three doses of granix last week and she gets hot flashes and night sweats after receiving the granix.  Otherwise she is doing well and a detailed ROS is non contributory.   BREAST CANCER HISTORY: From the original intake note:  The patient was seen in the emergency room 05/16/2016 with nonspecific complaints but also noting that she had a tender lump in her lateral left breast which she said she had noted the day before. Exam by the emergency room physician confirmed a 3 cm soft left lateral breast mass without overlying erythema or nipple changes. This was felt to be most consistent with fibrocystic change but the patient was referred back to her primary care physician for further evaluation. She saw the physician assistant in May 18 and then Dr. Wynetta Khan on 06/12/2016 who scheduled a bilateral diagnostic mammography with tomography and left breast ultrasonography at the [facility] 06/18/2016. This found the breast density to be category B. In the left breast upper outer quadrant there was an area of asymmetry measuring up to 10.3 cm. On exam this was firm  and palpable and on ultrasound there was an irregular mass measuring 2.5 cm with some subtle changes in the surrounding tissue. In the left lower axilla there were 2 suspicious-looking lymph nodes.  Biopsy of the left breast upper outer quadrant and one of the suspicious lymph nodes 06/19/2016 showed ([id number]) both to be involved by invasive ductal carcinoma, grade 3, estrogen and progesterone receptor negative, HER-2 not amplified with a signals ratio being 1.53-1.74 and the number per cell 2.35-3.05. The MIB-1 was 90%.  The patient's subsequent history is as detailed below.   PAST MEDICAL HISTORY: Past Medical History:  Diagnosis Date  . Breast cancer ([place])    lf breast  . Malignant neoplasm of upper-outer quadrant of left breast in female, estrogen receptor negative ([place]) 06/23/2016    PAST SURGICAL HISTORY: Past Surgical History:  Procedure Laterality Date  . ANKLE SURGERY    . IR FLUORO GUIDE PORT INSERTION RIGHT  07/14/2016  . IR US GUIDE VASC ACCESS RIGHT  07/14/2016  . TUBAL LIGATION      FAMILY HISTORY Family History  Problem Relation Age of Onset  . Diabetes Mother   . Hypertension Mother   . Arthritis Mother   . Colon cancer Father 70       d.76 metastatic at time of diagnosis  . Diabetes Father   . Heart disease Father   . Hypertension Father   . Breast cancer Maternal Grandmother 52       d.60s  . Breast cancer Maternal Aunt 52  . Cervical cancer Maternal Aunt 25  . Breast cancer Maternal Aunt 46       d.50s  .  Cancer Maternal Uncle        d.62s unspecified type of cancer  . Breast cancer Cousin 22       paternal first-cousin (daughter of unaffected aunt)  . Cancer Maternal Aunt 59       "Female Cancer"  . Cancer Maternal Aunt        unknown cancer  The patient's father died at age 41 from colon cancer which she never had treated. The patient's mother is living, 29 years old as of June 2018. The patient has one brother, one sister. On the mother's side  there is a history of breast cancer in the grandmother, age 2, and then 1 aunt with breast cancer age 33, and another with lung cancer age 28. There is no history of ovarian cancer in the family.  GYNECOLOGIC HISTORY:  No LMP recorded. Menarche age 60, first live birth age 65. She is GX P4. Her periods have never been regular. She had a period in June 2018 but not for 4 months before that. She took birth control for some years remotely with no complications. She is status post bilateral tubal ligation.  SOCIAL HISTORY:  The patient's husband owns and runs a food truck in the area. They serve mostly [place] and [place] food. 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 Her husband [name] (goes by "[name]") is originally from [name]. The patient has two children from her first marriage. [name], who is 47 years old, as of June 2018, who was recently in the [employer], but now is taking courses at [employer] and [name], who is studying psychology at [name]. Together with [name] the patient has two children who are [name] and [name], aged 39 and 66.     ADVANCED DIRECTIVES: Not in place   HEALTH MAINTENANCE: Social History   Tobacco Use  . Smoking status: Never Smoker  . Smokeless tobacco: Never Used  Substance Use Topics  . Alcohol use: No  . Drug use: No     Colonoscopy:Never   KNL:ZJQB 2018  Bone density:Never   No Known Allergies  Current Outpatient Medications  Medication Sig Dispense Refill  . acetaminophen (TYLENOL) 500 MG tablet Take 2 tablets (1,000 mg total) by mouth every 6 (six) hours as needed for mild pain. For fever/pain    . docusate sodium (COLACE) 100 MG capsule Take 1 capsule (100 mg total) by mouth 2 (two) times daily. 60 capsule 2  . lidocaine-prilocaine (EMLA) cream Apply to affected area once 30 g 3  . loratadine (CLARITIN) 10 MG tablet Take 10 mg by mouth  daily. On chemo days    . prochlorperazine (COMPAZINE) 10 MG tablet Take 10 mg by mouth every 6 (six) hours as needed for nausea or vomiting. On day of and three days after chemo    . traZODone (DESYREL) 50 MG tablet Take 100 mg by mouth at bedtime as needed for sleep.    . traZODone (DESYREL) 50 MG tablet TAKE 1 TABLET BY MOUTH AT BEDTIME AS NEEDED FOR SLEEP FOR 2 NIGHTS, THEN OK TO INCREASE TO 2 TABLETS EACH NIGHT AT BEDTIME 60 tablet 0   No current facility-administered medications for this visit.     OBJECTIVE:   Vitals:   11/30/16 1512  BP: 110/83  Pulse: 90  Resp: 16  Temp: 98.7 F (37.1 C)  SpO2: 100%     Body mass index is 39.97  kg/m.   Filed Weights   11/30/16 1512  Weight: 259 lb (117.5 kg)   ECOG FS:1 - Symptomatic but completely ambulatory GENERAL: Patient is a well appearing female in no acute distress HEENT:  Sclerae anicteric.  PERRL.  Oropharynx clear and moist. No ulcerations or evidence of oropharyngeal candidiasis. Neck is supple.  NODES:  No cervical, supraclavicular, or axillary lymphadenopathy palpated.  BREAST EXAM:  Deferred. LUNGS:  Clear to auscultation bilaterally.  No wheezes or rhonchi. HEART:  Regular rate and rhythm. No murmur appreciated. ABDOMEN:  Soft, nontender.  Positive, normoactive bowel sounds. No organomegaly palpated. MSK:  No focal spinal tenderness to palpation. Full range of motion bilaterally in the upper extremities. EXTREMITIES:  No peripheral edema.   SKIN:  Clear with no obvious rashes or skin changes. No nail dyscrasia. NEURO:  Nonfocal. Well oriented.  Appropriate affect.    LAB RESULTS:  CMP     Component Value Date/Time   NA 139 11/23/2016 1251   K 3.4 (L) 11/23/2016 1251   CL 105 10/26/2016 0409   CO2 28 11/23/2016 1251   GLUCOSE 108 11/23/2016 1251   BUN 6.5 (L) 11/23/2016 1251   CREATININE 0.8 11/23/2016 1251   CALCIUM 9.3 11/23/2016 1251   PROT 7.1 11/23/2016 1251   ALBUMIN 3.6 11/23/2016 1251   AST 23  11/23/2016 1251   ALT 25 11/23/2016 1251   ALKPHOS 91 11/23/2016 1251   BILITOT 0.66 11/23/2016 1251   GFRNONAA >60 10/26/2016 0409   GFRAA >60 10/26/2016 0409    No results found for: TOTALPROTELP, ALBUMINELP, A1GS, A2GS, BETS, BETA2SER, GAMS, MSPIKE, SPEI  No results found for: KPAFRELGTCHN, LAMBDASER, KAPLAMBRATIO  Lab Results  Component Value Date   WBC 3.0 (L) 11/30/2016   NEUTROABS 2.1 11/30/2016   HGB 9.4 (L) 11/30/2016   HCT 28.3 (L) 11/30/2016   MCV 95.0 11/30/2016   PLT 77 (L) 11/30/2016      Chemistry      Component Value Date/Time   NA 139 11/23/2016 1251   K 3.4 (L) 11/23/2016 1251   CL 105 10/26/2016 0409   CO2 28 11/23/2016 1251   BUN 6.5 (L) 11/23/2016 1251   CREATININE 0.8 11/23/2016 1251      Component Value Date/Time   CALCIUM 9.3 11/23/2016 1251   ALKPHOS 91 11/23/2016 1251   AST 23 11/23/2016 1251   ALT 25 11/23/2016 1251   BILITOT 0.66 11/23/2016 1251       No results found for: LABCA2  No components found for: [id number]  No results for input(s): INR in the last 168 hours.  Urinalysis    Component Value Date/Time   COLORURINE YELLOW 10/24/2016 1621   APPEARANCEUR CLEAR 10/24/2016 1621   LABSPEC 1.025 11/23/2016 1251   PHURINE 6.0 11/23/2016 1251   PHURINE 5.5 10/24/2016 1621   GLUCOSEU Negative 11/23/2016 1251   HGBUR Negative 11/23/2016 1251   HGBUR NEGATIVE 10/24/2016 1621   BILIRUBINUR Negative 11/23/2016 1251   KETONESUR Negative 11/23/2016 [place] 10/24/2016 1621   PROTEINUR Negative 11/23/2016 1251   PROTEINUR NEGATIVE 10/24/2016 1621   UROBILINOGEN 0.2 11/23/2016 1251   NITRITE Negative 11/23/2016 1251   NITRITE NEGATIVE 10/24/2016 1621   LEUKOCYTESUR Negative 11/23/2016 1251     STUDIES: No results found.  ELIGIBLE FOR AVAILABLE RESEARCH PROTOCOL: no   ASSESSMENT: 46 y.o. 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 Additional testing of the patient's mother and maternal aunt is pending. Otherwise [facility]'s Common Hereditary Cancers Panel found no deleterious mutations in APC, ATM, AXIN2, BARD1, BMPR1A, BRCA1, BRCA2, BRIP1, CDH1, CDKN2A, CHEK2, CTNNA1, DICER1, EPCAM, GREM1, HOXB13, KIT, MEN1, MLH1, MSH2, MSH3, MSH6, MUTYH, NBN, NF1, NTHL1, PALB2, PDGFRA, PMS2, POLD1, POLE, PTEN, RAD50, RAD51C, RAD51D, SDHA, SDHB, SDHC, SDHD, SMAD4, SMARCA4, STK11, TP53, TSC1, TSC2, and VHL.  (2) neoadjuvant chemotherapy to consist of doxorubicin and cyclophosphamide in dose dense fashion 4 completed 09/10/2016 to be followed by paclitaxel weekly 12 given with carboplatin   (a) cycle 4 of cyclophosphamide and doxorubicin was delayed 10 days and dose decreased 10% because of febrile neutropenia after cycle 3  (b) cycle 6 of Paclitaxel and Carboplatin delayed due to neutropenia, therefore Granix added to Wednesday, Thursday, Friday following chemotherapy days  (3) definitive surgery to follow  (4) adjuvant radiation to follow surgery  PLAN: Tanea is doing well today.  Her plt count is 77.  Her ANC is normal today.  I reviewed this with [name].  Per [name], she will proceed with chemotherapy today at normal doses, with Onpro, and skip chemotherapy next week. I will go ahead and see her next week to check her blood counts and make sure she doesn't need IV fluids or anything else.    I reviewed the above plan with Yanel and she is in agreement and verbalizes understanding.  She does not have any questions.  She knows to call prior to her next appt in one week for any questions or concerns.     A total of  (20) minutes of face-to-face time was spent with this patient with greater than 50% of that time in counseling and care-coordination.   Wilber Bihari, NP  11/30/16 3:18 PM Medical Oncology and Hematology [facility] [address] [place], [place] [address] Tel. [phone]    Fax. [phone]

## 2016-11-30 NOTE — Patient Instructions (Signed)
   [facility] Discharge Instructions for Patients Receiving Chemotherapy  Today you received the following chemotherapy agents Taxol and Carboplatin   To help prevent nausea and vomiting after your treatment, we encourage you to take your nausea medication as directed.    If you develop nausea and vomiting that is not controlled by your nausea medication, call the clinic.   BELOW ARE SYMPTOMS THAT SHOULD BE REPORTED IMMEDIATELY:  *FEVER GREATER THAN 100.5 F  *CHILLS WITH OR WITHOUT FEVER  NAUSEA AND VOMITING THAT IS NOT CONTROLLED WITH YOUR NAUSEA MEDICATION  *UNUSUAL SHORTNESS OF BREATH  *UNUSUAL BRUISING OR BLEEDING  TENDERNESS IN MOUTH AND THROAT WITH OR WITHOUT PRESENCE OF ULCERS  *URINARY PROBLEMS  *BOWEL PROBLEMS  UNUSUAL RASH Items with * indicate a potential emergency and should be followed up as soon as possible.  Feel free to call the clinic should you have any questions or concerns. The clinic phone number is [phone].  Please show the CHEMO ALERT CARD at check-in to the Emergency Department and triage nurse.   

## 2016-12-03 ENCOUNTER — Ambulatory Visit: Payer: Medicaid Other

## 2016-12-07 ENCOUNTER — Ambulatory Visit (HOSPITAL_COMMUNITY)
Admission: RE | Admit: 2016-12-07 | Discharge: 2016-12-07 | Disposition: A | Discharge status: Home / Self Care | Payer: Medicaid Other | Source: Ambulatory Visit | Attending: Adult Health | Admitting: Adult Health

## 2016-12-07 ENCOUNTER — Ambulatory Visit: Payer: Medicaid Other

## 2016-12-07 ENCOUNTER — Ambulatory Visit (HOSPITAL_BASED_OUTPATIENT_CLINIC_OR_DEPARTMENT_OTHER): Payer: Medicaid Other | Admitting: Adult Health

## 2016-12-07 ENCOUNTER — Other Ambulatory Visit (HOSPITAL_BASED_OUTPATIENT_CLINIC_OR_DEPARTMENT_OTHER): Payer: Medicaid Other

## 2016-12-07 ENCOUNTER — Encounter: Payer: Self-pay | Admitting: Adult Health

## 2016-12-07 ENCOUNTER — Ambulatory Visit: Payer: Medicaid Other | Admitting: Oncology

## 2016-12-07 ENCOUNTER — Encounter: Payer: Self-pay | Admitting: *Deleted

## 2016-12-07 ENCOUNTER — Telehealth: Payer: Self-pay | Admitting: Oncology

## 2016-12-07 VITALS — BP 128/94 | HR 104 | Temp 98.5°F | Resp 19 | Ht 67.5 in | Wt 258.7 lb

## 2016-12-07 DIAGNOSIS — Z171 Estrogen receptor negative status [ER-]: Principal | ICD-10-CM

## 2016-12-07 DIAGNOSIS — R509 Fever, unspecified: Secondary | ICD-10-CM | POA: Diagnosis present

## 2016-12-07 DIAGNOSIS — Z452 Encounter for adjustment and management of vascular access device: Secondary | ICD-10-CM | POA: Diagnosis not present

## 2016-12-07 DIAGNOSIS — C50412 Malignant neoplasm of upper-outer quadrant of left female breast: Secondary | ICD-10-CM

## 2016-12-07 DIAGNOSIS — C773 Secondary and unspecified malignant neoplasm of axilla and upper limb lymph nodes: Secondary | ICD-10-CM | POA: Diagnosis not present

## 2016-12-07 DIAGNOSIS — Z95828 Presence of other vascular implants and grafts: Secondary | ICD-10-CM

## 2016-12-07 LAB — COMPREHENSIVE METABOLIC PANEL
ALT: 28 U/L (ref 0–55)
ANION GAP: 10 meq/L (ref 3–11)
AST: 20 U/L (ref 5–34)
Albumin: 3.6 g/dL (ref 3.5–5.0)
Alkaline Phosphatase: 101 U/L (ref 40–150)
BUN: 3.5 mg/dL — ABNORMAL LOW (ref 7.0–26.0)
CHLORIDE: 102 meq/L (ref 98–109)
CO2: 27 meq/L (ref 22–29)
Calcium: 9.1 mg/dL (ref 8.4–10.4)
Creatinine: 0.7 mg/dL (ref 0.6–1.1)
Glucose: 106 mg/dl (ref 70–140)
POTASSIUM: 3.3 meq/L — AB (ref 3.5–5.1)
Sodium: 139 mEq/L (ref 136–145)
Total Bilirubin: 0.76 mg/dL (ref 0.20–1.20)
Total Protein: 6.7 g/dL (ref 6.4–8.3)

## 2016-12-07 LAB — CBC WITH DIFFERENTIAL/PLATELET
BASO%: 0.2 % (ref 0.0–2.0)
Basophils Absolute: 0 10*3/uL (ref 0.0–0.1)
EOS%: 0.4 % (ref 0.0–7.0)
Eosinophils Absolute: 0 10*3/uL (ref 0.0–0.5)
HCT: 27.1 % — ABNORMAL LOW (ref 34.8–46.6)
HGB: 8.9 g/dL — ABNORMAL LOW (ref 11.6–15.9)
LYMPH%: 22.1 % (ref 14.0–49.7)
MCH: 31 pg (ref 25.1–34.0)
MCHC: 32.8 g/dL (ref 31.5–36.0)
MCV: 94.4 fL (ref 79.5–101.0)
MONO#: 0.6 10*3/uL (ref 0.1–0.9)
MONO%: 12.8 % (ref 0.0–14.0)
NEUT#: 2.9 10*3/uL (ref 1.5–6.5)
NEUT%: 64.5 % (ref 38.4–76.8)
PLATELETS: 94 10*3/uL — AB (ref 145–400)
RBC: 2.87 10*6/uL — AB (ref 3.70–5.45)
RDW: 16.3 % — ABNORMAL HIGH (ref 11.2–14.5)
WBC: 4.5 10*3/uL (ref 3.9–10.3)
lymph#: 1 10*3/uL (ref 0.9–3.3)

## 2016-12-07 MED ORDER — SODIUM CHLORIDE 0.9% FLUSH
10.0000 mL | INTRAVENOUS | Status: DC | PRN
  Administered 2016-12-07: 10 mL via INTRAVENOUS
  Filled 2016-12-07: qty 10

## 2016-12-07 MED ORDER — CIPROFLOXACIN HCL 500 MG PO TABS: 500.0000 mg | ORAL_TABLET | Freq: Two times a day (BID) | ORAL | 0 refills | Status: DC

## 2016-12-07 MED ORDER — SODIUM CHLORIDE 0.9% FLUSH
10.0000 mL | Freq: Once | INTRAVENOUS | Status: AC
  Administered 2016-12-07: 10 mL
  Filled 2016-12-07: qty 10

## 2016-12-07 MED ORDER — HEPARIN SOD (PORK) LOCK FLUSH 100 UNIT/ML IV SOLN
500.0000 [IU] | Freq: Once | INTRAVENOUS | Status: AC
  Administered 2016-12-07: 500 [IU] via INTRAVENOUS
  Filled 2016-12-07: qty 5

## 2016-12-07 MED ORDER — AMOXICILLIN-POT CLAVULANATE 875-125 MG PO TABS: 1.0000 | ORAL_TABLET | Freq: Two times a day (BID) | ORAL | 0 refills | Status: DC

## 2016-12-07 MED FILL — AMOX TR-K CLV 875-125 MG TA: 875-125 | 5 days supply | Qty: 10 | Fill #0

## 2016-12-07 MED FILL — CIPROFLOXACIN HCL 500 MG TA: 500 | 5 days supply | Qty: 10 | Fill #0

## 2016-12-07 NOTE — Progress Notes (Signed)
[facility]  Telephone:([phone]) [phone] Fax:(336) [phone]     ID: [name] DOB: 02/28/1970  MR#: [id number]  XNA#:[id number]  Patient Care Team: Sarah Pier, MD as PCP - General (Internal Medicine) Sarah Skates, MD as Consulting Physician (General Surgery) Khan, Sarah Dad, MD as Consulting Physician (Oncology) Sarah Gibson, MD as Attending Physician (Radiation Oncology) OTHER MD:  CHIEF COMPLAINT: Triple negative breast cancer  CURRENT TREATMENT: Neoadjuvant chemotherapy  INTERVAL HISTORY: [name] returns today for follow-up and treatment of her triple negative breast cancer. She is accompanied by her friend. She is currently receiving neoadjuvant chemotherapy and today is day 8 cycle 7 of 12 weekly paclitaxel and carboplatin dose as planned.   REVIEW OF SYSTEMS: [name] is not feeling well today.  She has had a low grade fever of 100.3 to 100.4 for the past two days.  She has some mild dyspnea.  She denies cough, nasal drainage, vomiting, diarrhea, skin issues or concerns, or concerns at her port site (has had previous cellulitis at port).  She is eating and drinking well.  Otherwise a detailed ROS is non contributory.    BREAST CANCER HISTORY: From the original intake note:  The patient was seen in the emergency room 05/16/2016 with nonspecific complaints but also noting that she had a tender lump in her lateral left breast which she said she had noted the day before. Exam by the emergency room physician confirmed a 3 cm soft left lateral breast mass without overlying erythema or nipple changes. This was felt to be most consistent with fibrocystic change but the patient was referred back to her primary care physician for further evaluation. She saw the physician assistant in May 18 and then Dr. Wynetta Khan on 06/12/2016 who scheduled a bilateral diagnostic mammography with tomography and left breast ultrasonography at the [facility] 06/18/2016. This found the  breast density to be category B. In the left breast upper outer quadrant there was an area of asymmetry measuring up to 10.3 cm. On exam this was firm and palpable and on ultrasound there was an irregular mass measuring 2.5 cm with some subtle changes in the surrounding tissue. In the left lower axilla there were 2 suspicious-looking lymph nodes.  Biopsy of the left breast upper outer quadrant and one of the suspicious lymph nodes 06/19/2016 showed ([id number]) both to be involved by invasive ductal carcinoma, grade 3, estrogen and progesterone receptor negative, HER-2 not amplified with a signals ratio being 1.53-1.74 and the number per cell 2.35-3.05. The MIB-1 was 90%.  The patient's subsequent history is as detailed below.   PAST MEDICAL HISTORY: Past Medical History:  Diagnosis Date  . Breast cancer ([place])    lf breast  . Malignant neoplasm of upper-outer quadrant of left breast in female, estrogen receptor negative ([place]) 06/23/2016    PAST SURGICAL HISTORY: Past Surgical History:  Procedure Laterality Date  . ANKLE SURGERY    . IR FLUORO GUIDE PORT INSERTION RIGHT  07/14/2016  . IR US GUIDE VASC ACCESS RIGHT  07/14/2016  . TUBAL LIGATION      FAMILY HISTORY Family History  Problem Relation Age of Onset  . Diabetes Mother   . Hypertension Mother   . Arthritis Mother   . Colon cancer Father 82       d.76 metastatic at time of diagnosis  . Diabetes Father   . Heart disease Father   . Hypertension Father   . Breast cancer Maternal Grandmother 3  d.49s  . Breast cancer Maternal Aunt 52  . Cervical cancer Maternal Aunt 36  . Breast cancer Maternal Aunt 46       d.50s  . Cancer Maternal Uncle        d.62s unspecified type of cancer  . Breast cancer Cousin 30       paternal first-cousin (daughter of unaffected aunt)  . Cancer Maternal Aunt 52       "Female Cancer"  . Cancer Maternal Aunt        unknown cancer  The patient's father died at age 54 from colon cancer  which she never had treated. The patient's mother is living, 64 years old as of June 2018. The patient has one brother, one sister. On the mother's side there is a history of breast cancer in the grandmother, age 37, and then 1 aunt with breast cancer age 39, and another with lung cancer age 61. There is no history of ovarian cancer in the family.  GYNECOLOGIC HISTORY:  No LMP recorded. Menarche age 19, first live birth age 32. She is GX P4. Her periods have never been regular. She had a period in June 2018 but not for 4 months before that. She took birth control for some years remotely with no complications. She is status post bilateral tubal ligation.  SOCIAL HISTORY:  The patient's husband owns and runs a food truck in the area. They serve mostly [place] and [place] food. 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 Her husband [name] (goes by "[name]") is originally from [name]. The patient has two children from her first marriage. [name] "[name], who is 47 years old, as of June 2018, who was recently in the [employer], but now is taking courses at [employer] and [name], who is studying psychology at [name] Together with [name] the patient has two children who are [name] and [name], aged 39 and 97.     ADVANCED DIRECTIVES: Not in place   HEALTH MAINTENANCE: Social History   Tobacco Use  . Smoking status: Never Smoker  . Smokeless tobacco: Never Used  Substance Use Topics  . Alcohol use: No  . Drug use: No     Colonoscopy:Never   AGT:XMIW 2018  Bone density:Never   No Known Allergies  Current Outpatient Medications  Medication Sig Dispense Refill  . acetaminophen (TYLENOL) 500 MG tablet Take 2 tablets (1,000 mg total) by mouth every 6 (six) hours as needed for mild pain. For fever/pain    . docusate sodium (COLACE) 100 MG capsule Take 1 capsule (100 mg total) by mouth 2 (two)  times daily. 60 capsule 2  . lidocaine-prilocaine (EMLA) cream Apply to affected area once 30 g 3  . loratadine (CLARITIN) 10 MG tablet Take 10 mg by mouth daily. On chemo days    . prochlorperazine (COMPAZINE) 10 MG tablet Take 10 mg by mouth every 6 (six) hours as needed for nausea or vomiting. On day of and three days after chemo    . traZODone (DESYREL) 50 MG tablet Take 100 mg by mouth at bedtime as needed for sleep.    . traZODone (DESYREL) 50 MG tablet TAKE 1 TABLET BY MOUTH AT BEDTIME AS NEEDED FOR SLEEP FOR 2 NIGHTS, THEN OK TO INCREASE TO 2 TABLETS EACH NIGHT AT BEDTIME 60 tablet 0  . amoxicillin-clavulanate (AUGMENTIN) 875-125 MG tablet Take 1 tablet by mouth 2 (two) times daily. 10  tablet 0  . ciprofloxacin (CIPRO) 500 MG tablet Take 1 tablet (500 mg total) by mouth 2 (two) times daily. 10 tablet 0   Current Facility-Administered Medications  Medication Dose Route Frequency Provider Last Rate Last Dose  . sodium chloride flush (NS) 0.9 % injection 10 mL  10 mL Intravenous PRN Gardenia Phlegm, NP   10 mL at 12/07/16 1427    OBJECTIVE:   Vitals:   12/07/16 1336  BP: (!) 128/94  Pulse: (!) 104  Resp: 19  Temp: 98.5 F (36.9 C)  SpO2: 99%     Body mass index is 39.92 kg/m.   Filed Weights   12/07/16 1336  Weight: 258 lb 11.2 oz (117.3 kg)   ECOG FS:1 - Symptomatic but completely ambulatory GENERAL: Patient is a well appearing female in no acute distress HEENT:  Sclerae anicteric.  PERRL.  Oropharynx clear and moist. No ulcerations or evidence of oropharyngeal candidiasis. Neck is supple.  NODES:  No cervical, supraclavicular, or axillary lymphadenopathy palpated.  BREAST EXAM:  Deferred. LUNGS:  Clear to auscultation bilaterally.  No wheezes or rhonchi. HEART:  Regular rate and rhythm. No murmur appreciated. ABDOMEN:  Soft, nontender.  Positive, normoactive bowel sounds. No organomegaly palpated. MSK:  No focal spinal tenderness to palpation. Full range of  motion bilaterally in the upper extremities. EXTREMITIES:  No peripheral edema.   SKIN:  Clear with no obvious rashes or skin changes. No nail dyscrasia. NEURO:  Nonfocal. Well oriented.  Appropriate affect.    LAB RESULTS:  CMP     Component Value Date/Time   NA 139 12/07/2016 1249   K 3.3 (L) 12/07/2016 1249   CL 105 10/26/2016 0409   CO2 27 12/07/2016 1249   GLUCOSE 106 12/07/2016 1249   BUN 3.5 (L) 12/07/2016 1249   CREATININE 0.7 12/07/2016 1249   CALCIUM 9.1 12/07/2016 1249   PROT 6.7 12/07/2016 1249   ALBUMIN 3.6 12/07/2016 1249   AST 20 12/07/2016 1249   ALT 28 12/07/2016 1249   ALKPHOS 101 12/07/2016 1249   BILITOT 0.76 12/07/2016 1249   GFRNONAA >60 10/26/2016 0409   GFRAA >60 10/26/2016 0409    No results found for: TOTALPROTELP, ALBUMINELP, A1GS, A2GS, BETS, BETA2SER, GAMS, MSPIKE, SPEI  No results found for: KPAFRELGTCHN, LAMBDASER, KAPLAMBRATIO  Lab Results  Component Value Date   WBC 4.5 12/07/2016   NEUTROABS 2.9 12/07/2016   HGB 8.9 (L) 12/07/2016   HCT 27.1 (L) 12/07/2016   MCV 94.4 12/07/2016   PLT 94 (L) 12/07/2016      Chemistry      Component Value Date/Time   NA 139 12/07/2016 1249   K 3.3 (L) 12/07/2016 1249   CL 105 10/26/2016 0409   CO2 27 12/07/2016 1249   BUN 3.5 (L) 12/07/2016 1249   CREATININE 0.7 12/07/2016 1249      Component Value Date/Time   CALCIUM 9.1 12/07/2016 1249   ALKPHOS 101 12/07/2016 1249   AST 20 12/07/2016 1249   ALT 28 12/07/2016 1249   BILITOT 0.76 12/07/2016 1249       No results found for: LABCA2  No components found for: TSVXBL390  No results for input(s): INR in the last 168 hours.  Urinalysis    Component Value Date/Time   COLORURINE YELLOW 10/24/2016 [place] 10/24/2016 1621   LABSPEC 1.025 11/23/2016 1251   PHURINE 6.0 11/23/2016 1251   PHURINE 5.5 10/24/2016 1621   GLUCOSEU Negative 11/23/2016 1251   HGBUR Negative 11/23/2016  [place] 10/24/2016 1621    BILIRUBINUR Negative 11/23/2016 1251   KETONESUR Negative 11/23/2016 [place] 10/24/2016 1621   PROTEINUR Negative 11/23/2016 1251   PROTEINUR NEGATIVE 10/24/2016 1621   UROBILINOGEN 0.2 11/23/2016 1251   NITRITE Negative 11/23/2016 1251   NITRITE NEGATIVE 10/24/2016 1621   LEUKOCYTESUR Negative 11/23/2016 1251     STUDIES: No results found.  ELIGIBLE FOR AVAILABLE RESEARCH PROTOCOL: no   ASSESSMENT: 46 y.o. 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 Additional testing of the patient's mother and maternal aunt is pending. Otherwise [facility]'s Common Hereditary Cancers Panel found no deleterious mutations in APC, ATM, AXIN2, BARD1, BMPR1A, BRCA1, BRCA2, BRIP1, CDH1, CDKN2A, CHEK2, CTNNA1, DICER1, EPCAM, GREM1, HOXB13, KIT, MEN1, MLH1, MSH2, MSH3, MSH6, MUTYH, NBN, NF1, NTHL1, PALB2, PDGFRA, PMS2, POLD1, POLE, PTEN, RAD50, RAD51C, RAD51D, SDHA, SDHB, SDHC, SDHD, SMAD4, SMARCA4, STK11, TP53, TSC1, TSC2, and VHL.  (2) neoadjuvant chemotherapy to consist of doxorubicin and cyclophosphamide in dose dense fashion 4 completed 09/10/2016 to be followed by paclitaxel weekly 12 given with carboplatin   (a) cycle 4 of cyclophosphamide and doxorubicin was delayed 10 days and dose decreased 10% because of febrile neutropenia after cycle 3  (b) cycle 6 of Paclitaxel and Carboplatin delayed due to neutropenia, therefore Granix added to Wednesday, Thursday, Friday following chemotherapy days  (3) definitive surgery to follow  (4) adjuvant radiation to follow surgery  PLAN:  [name] is doing  moderately well.  Due to her fever we will get blood cultures x 1--one centrally and one peripherally, she will also undergo chest xray.  I reviewed it with [name], and since she's had issues with infections previously, she will start on both Cipro and Augmentin BID.  Her WBC and neutrophils are normal today.  She knows to call for any questions or concerns whatsoever.  She will return in one week for labs, f/u and cycle 8 of Taxol/Carbo.    I reviewed the above plan with [name] and she is in agreement and verbalizes understanding.  She does not have any questions.  She knows to call prior to her next appt in one week for any questions or concerns.     A total of (30) minutes of face-to-face time was spent with this patient with greater than 50% of that time in counseling and care-coordination.   Wilber Bihari, NP  12/07/16 2:29 PM Medical Oncology and Hematology [facility] [address] [place], [place] [address] Tel. [phone]    Fax. [phone]

## 2016-12-07 NOTE — Telephone Encounter (Signed)
Patient declined AVS and calendar of upcoming appointments. Patient will receive update in MyChart.  °

## 2016-12-08 ENCOUNTER — Telehealth: Payer: Self-pay

## 2016-12-08 NOTE — Telephone Encounter (Signed)
FYI

## 2016-12-08 NOTE — Telephone Encounter (Signed)
Spoke with patient to let her know that CXR results were normal.  Asked if she was feeling any better and if she had any more fever.  Pt stated that fever was 100.1 last night and that she had not checked it today but does not feel like she has one.   LPN asked if she feels that antibiotics have caused any side effects so far and pt says no.  Encouraged pt to call center if she notices any changes or begins to feel worse.  Pt voiced understanding and thanks.

## 2016-12-08 NOTE — Telephone Encounter (Signed)
-----   Message from Gardenia Phlegm, NP sent at 12/08/2016 10:07 AM EST ----- Chest xray normal, please tell patient.  Please check on her and see how she is doing and how she is tolerating her antibiotics ----- Message ----- From: Interface, Rad Results In Sent: 12/08/2016   7:06 AM To: Gardenia Phlegm, NP

## 2016-12-13 LAB — CULTURE, BLOOD (SINGLE)

## 2016-12-14 ENCOUNTER — Other Ambulatory Visit: Payer: Medicaid Other

## 2016-12-14 ENCOUNTER — Ambulatory Visit: Payer: Medicaid Other | Admitting: Adult Health

## 2016-12-14 ENCOUNTER — Ambulatory Visit: Payer: Medicaid Other

## 2016-12-18 NOTE — Progress Notes (Signed)
[name]  Telephone:([phone]) [phone] [phone]     ID: [name] DOB: 1970-08-04  MR#: [id number]  JYN#:[id number]  Patient Care Team: [name], MD as PCP - General (Internal Medicine) [name], MD as Consulting Physician (General Surgery) Cherisa Brucker, [name], MD as Consulting Physician (Oncology) [name], MD as Attending Physician (Radiation Oncology) OTHER MD:  CHIEF COMPLAINT: Triple negative breast cancer  CURRENT TREATMENT: Neoadjuvant chemotherapy  INTERVAL HISTORY: [name] returns today for follow-up and treatment of her triple negative breast cancer. She is accompanied by her daughter.  She is continuing neoadjuvant chemotherapy and today is day 1 cycle 8 of paclitaxel and carboplatin. She notes that she experienced a lot of severe pain with the shots. She notes that the pain was worse than the Nuelasta. She denies having oral sores. She notes that she had no trouble with her port.  Since her last visit, she completed a chest x-ray on 12/07/2016 showing: PowerPort catheter noted with lead tip over the superior vena cava. No acute cardiopulmonary disease.   REVIEW OF SYSTEMS: Deniz reports that she feels good overall. She has had an increase in energy. She reports that she has 5 treatments left. She had some antibiotics. She denies unusual headaches, visual changes, nausea, vomiting, or dizziness. There has been no unusual cough, phlegm production, or pleurisy. This been no change in bowel or bladder habits. She denies unexplained fatigue or unexplained weight loss, bleeding, rash, or fever. A detailed review of systems was otherwise stable.    BREAST CANCER HISTORY: From the original intake note:  The patient was seen in the emergency room 05/16/2016 with nonspecific complaints but also noting that she had a tender lump in her lateral left breast which she said she had noted the day before. Exam by the emergency room physician  confirmed a 3 cm soft left lateral breast mass without overlying erythema or nipple changes. This was felt to be most consistent with fibrocystic change but the patient was referred back to her primary care physician for further evaluation. She saw the physician assistant in May 18 and then Dr. Wynetta Emery on 06/12/2016 who scheduled a bilateral diagnostic mammography with tomography and left breast ultrasonography at the [facility] 06/18/2016. This found the breast density to be category B. In the left breast upper outer quadrant there was an area of asymmetry measuring up to 10.3 cm. On exam this was firm and palpable and on ultrasound there was an irregular mass measuring 2.5 cm with some subtle changes in the surrounding tissue. In the left lower axilla there were 2 suspicious-looking lymph nodes.  Biopsy of the left breast upper outer quadrant and one of the suspicious lymph nodes 06/19/2016 showed ([id number]) both to be involved by invasive ductal carcinoma, grade 3, estrogen and progesterone receptor negative, HER-2 not amplified with a signals ratio being 1.53-1.74 and the number per cell 2.35-3.05. The MIB-1 was 90%.  The patient's subsequent history is as detailed below.   PAST MEDICAL HISTORY: Past Medical History:  Diagnosis Date  . Breast cancer ([place])    lf breast  . Malignant neoplasm of upper-outer quadrant of left breast in female, estrogen receptor negative ([place]) 06/23/2016    PAST SURGICAL HISTORY: Past Surgical History:  Procedure Laterality Date  . ANKLE SURGERY    . IR FLUORO GUIDE PORT INSERTION RIGHT  07/14/2016  . IR US GUIDE VASC ACCESS RIGHT  07/14/2016  . TUBAL LIGATION      FAMILY HISTORY Family  History  Problem Relation Age of Onset  . Diabetes Mother   . Hypertension Mother   . Arthritis Mother   . Colon cancer Father 3       d.76 metastatic at time of diagnosis  . Diabetes Father   . Heart disease Father   . Hypertension Father   . Breast cancer  Maternal Grandmother 49       d.60s  . Breast cancer Maternal Aunt 52  . Cervical cancer Maternal Aunt 79  . Breast cancer Maternal Aunt 46       d.50s  . Cancer Maternal Uncle        d.62s unspecified type of cancer  . Breast cancer Cousin 68       paternal first-cousin (daughter of unaffected aunt)  . Cancer Maternal Aunt 46       "Female Cancer"  . Cancer Maternal Aunt        unknown cancer  The patient's father died at age 69 from colon cancer which she never had treated. The patient's mother is living, 47 years old as of June 2018. The patient has one brother, one sister. On the mother's side there is a history of breast cancer in the grandmother, age 34, and then 1 aunt with breast cancer age 47, and another with lung cancer age 47. There is no history of ovarian cancer in the family.  GYNECOLOGIC HISTORY:  No LMP recorded. Menarche age 46, first live birth age 46. She is GX P4. Her periods have never been regular. She had a period in June 2018 but not for 4 months before that. She took birth control for some years remotely with no complications. She is status post bilateral tubal ligation.  SOCIAL HISTORY:  The patient's husband owns and runs a food truck in the area. They serve mostly [place] and [place] food. 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 Her husband [name] [name] (goes by "[name]") is originally from [name]. The patient has two children from her first marriage. [name], who is 47 years old, as of June 2018, who was recently in the [employer], but now is taking courses at [employer] and [name], who is studying psychology at [name]. Together with [name] the patient has two children who are [name] and hila, aged 49 and 47.     ADVANCED DIRECTIVES: Not in place   HEALTH MAINTENANCE: Social History   Tobacco Use  . Smoking status: Never Smoker  . Smokeless  tobacco: Never Used  Substance Use Topics  . Alcohol use: No  . Drug use: No     Colonoscopy:Never   ZRA:QTMA 2018  Bone density:Never   No Known Allergies  Current Outpatient Medications  Medication Sig Dispense Refill  . acetaminophen (TYLENOL) 500 MG tablet Take 2 tablets (1,000 mg total) by mouth every 6 (six) hours as needed for mild pain. For fever/pain    . docusate sodium (COLACE) 100 MG capsule Take 1 capsule (100 mg total) by mouth 2 (two) times daily. 60 capsule 2  . lidocaine-prilocaine (EMLA) cream Apply to affected area once 30 g 3  . loratadine (CLARITIN) 10 MG tablet Take 10 mg by mouth daily. On chemo days    . prochlorperazine (COMPAZINE) 10 MG tablet Take 10 mg by mouth every 6 (six) hours as needed for nausea or vomiting. On day of and three days after chemo    . traZODone (  DESYREL) 50 MG tablet Take 100 mg by mouth at bedtime as needed for sleep.    . traZODone (DESYREL) 50 MG tablet TAKE 1 TABLET BY MOUTH AT BEDTIME AS NEEDED FOR SLEEP FOR 2 NIGHTS, THEN OK TO INCREASE TO 2 TABLETS EACH NIGHT AT BEDTIME 60 tablet 0   No current facility-administered medications for this visit.     OBJECTIVE: Middle-aged white woman in no acute distress  Vitals:   12/21/16 1245  BP: 139/86  Pulse: 94  Resp: 18  Temp: 98.6 F (37 C)  SpO2: 98%     Body mass index is 40.48 kg/m.   Filed Weights   12/21/16 1245  Weight: 262 lb 4.8 oz (119 kg)   ECOG FS:0 - Asymptomatic  Sclerae unicteric, pupils round and equal Oropharynx clear and moist No cervical or supraclavicular adenopathy Lungs no rales or rhonchi Heart regular rate and rhythm Abd soft, nontender, positive bowel sounds MSK no focal spinal tenderness, no upper extremity lymphedema Neuro: nonfocal, well oriented, appropriate affect Breasts: I do not palpate any mass in the left breast.  Both axillae are benign   LAB RESULTS:  CMP     Component Value Date/Time   NA 141 12/21/2016 1206   K 3.6  12/21/2016 1206   CL 105 10/26/2016 0409   CO2 26 12/21/2016 1206   GLUCOSE 102 12/21/2016 1206   BUN 6.2 (L) 12/21/2016 1206   CREATININE 0.7 12/21/2016 1206   CALCIUM 9.2 12/21/2016 1206   PROT 6.7 12/21/2016 1206   ALBUMIN 3.6 12/21/2016 1206   AST 21 12/21/2016 1206   ALT 19 12/21/2016 1206   ALKPHOS 77 12/21/2016 1206   BILITOT 0.89 12/21/2016 1206   GFRNONAA >60 10/26/2016 0409   GFRAA >60 10/26/2016 0409    No results found for: TOTALPROTELP, ALBUMINELP, A1GS, A2GS, BETS, BETA2SER, GAMS, MSPIKE, SPEI  No results found for: Nils Pyle, [facility]  Lab Results  Component Value Date   WBC 3.0 (L) 12/21/2016   NEUTROABS 1.9 12/21/2016   HGB 9.8 (L) 12/21/2016   HCT 30.4 (L) 12/21/2016   MCV 99.0 12/21/2016   PLT 153 12/21/2016      Chemistry      Component Value Date/Time   NA 141 12/21/2016 1206   K 3.6 12/21/2016 1206   CL 105 10/26/2016 0409   CO2 26 12/21/2016 1206   BUN 6.2 (L) 12/21/2016 1206   CREATININE 0.7 12/21/2016 1206      Component Value Date/Time   CALCIUM 9.2 12/21/2016 1206   ALKPHOS 77 12/21/2016 1206   AST 21 12/21/2016 1206   ALT 19 12/21/2016 1206   BILITOT 0.89 12/21/2016 1206       No results found for: LABCA2  No components found for: [id number]  No results for input(s): INR in the last 168 hours.  Urinalysis    Component Value Date/Time   COLORURINE YELLOW 10/24/2016 1621   APPEARANCEUR CLEAR 10/24/2016 1621   LABSPEC 1.025 11/23/2016 1251   PHURINE 6.0 11/23/2016 1251   PHURINE 5.5 10/24/2016 1621   GLUCOSEU Negative 11/23/2016 1251   HGBUR Negative 11/23/2016 1251   HGBUR NEGATIVE 10/24/2016 1621   BILIRUBINUR Negative 11/23/2016 1251   KETONESUR Negative 11/23/2016 [name] 10/24/2016 1621   PROTEINUR Negative 11/23/2016 1251   PROTEINUR NEGATIVE 10/24/2016 1621   UROBILINOGEN 0.2 11/23/2016 1251   NITRITE Negative 11/23/2016 1251   NITRITE NEGATIVE 10/24/2016 1621   LEUKOCYTESUR  Negative 11/23/2016 1251     STUDIES:  Dg Chest 2 View  Result Date: 12/08/2016 CLINICAL DATA:  Fever. Shortness of breath. Chemotherapy. Breast cancer . EXAM: CHEST  2 VIEW COMPARISON:  10/24/2016. FINDINGS: PowerPort catheter with lead tip over the superior vena cava. Mediastinum hilar structures normal. Heart size normal. No focal infiltrate. No pleural effusion or pneumothorax. No focal bony abnormality. IMPRESSION: 1. PowerPort catheter noted with lead tip over the superior vena cava. 2.  No acute cardiopulmonary disease. Electronically Signed   By: Marcello Moores  Register   On: 12/08/2016 06:51    ELIGIBLE FOR AVAILABLE RESEARCH PROTOCOL: no   ASSESSMENT: 46 y.o. 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 Additional testing of the patient's mother and maternal aunt is pending. Otherwise [facility]'s Common Hereditary Cancers Panel found no deleterious mutations in APC, ATM, AXIN2, BARD1, BMPR1A, BRCA1, BRCA2, BRIP1, CDH1, CDKN2A, CHEK2, CTNNA1, DICER1, EPCAM, GREM1, HOXB13, KIT, MEN1, MLH1, MSH2, MSH3, MSH6, MUTYH, NBN, NF1, NTHL1, PALB2, PDGFRA, PMS2, POLD1, POLE, PTEN, RAD50, RAD51C, RAD51D, SDHA, SDHB, SDHC, SDHD, SMAD4, SMARCA4, STK11, TP53, TSC1, TSC2, and VHL.  (2) neoadjuvant chemotherapy to consist of doxorubicin and cyclophosphamide in dose dense fashion 4 completed 09/10/2016 to be followed by paclitaxel weekly 12 given with carboplatin   (a) cycle 4 of cyclophosphamide and doxorubicin was delayed 10 days and dose decreased 10% because of febrile neutropenia after cycle 3  (b) cycle 6 of Paclitaxel  and Carboplatin delayed due to neutropenia, therefore Granix added to Wednesday, Thursday, Friday following chemotherapy days  (3) definitive surgery to follow  (4) adjuvant radiation to follow surgery  PLAN: I am not sure were going to be able to give Erleen any more treatment after today.  She is not tolerating the Neupogen.  She does well with the Neulasta but giving the Taxol every 2 weeks is not documented in this setting.  Accordingly very likely this will be her final treatment.  She will have Neulasta with this dose.  She will have an MRI of the breast 01/01/2017 and then she will see her surgeon to discuss surgery the first week in January.  She will see [place] at the same time.  If there is more disease there than we expect then we can always proceed to treatment that day, which is scheduled, but my expectation is no further treatment after today and proceeding directly to surgery  We discussed her surgical options.  She carries a mutation for which was classified as malignant by [employer].  This does not mean that she has to have bilateral mastectomies.  It does mean if she keeps her breast, which is her preference, we will have to do breast MRI yearly together with mammography, each 6 months apart.  She is agreeable to this.  At this point then I am hopeful that she will get good results on her breast MRI, be able to proceed to lumpectomy and targeted axillary lymph node dissection, and then move on to radiation.  Dara Beidleman, [name], MD  12/21/16 1:10 PM Medical Oncology and Hematology [facility] [address] [place], [place] [address] Tel. [phone]    Fax. [phone]  This document serves as a  record of services personally performed by Lurline Del, MD. It was created on his behalf by Sheron Nightingale, a trained medical scribe. The creation of this record is based on the scribe's personal observations and the provider's statements to them.   I have  reviewed the above documentation for accuracy and completeness, and I agree with the above.

## 2016-12-21 ENCOUNTER — Ambulatory Visit (HOSPITAL_BASED_OUTPATIENT_CLINIC_OR_DEPARTMENT_OTHER): Payer: Medicaid Other

## 2016-12-21 ENCOUNTER — Ambulatory Visit (HOSPITAL_BASED_OUTPATIENT_CLINIC_OR_DEPARTMENT_OTHER): Payer: Medicaid Other | Admitting: Oncology

## 2016-12-21 ENCOUNTER — Ambulatory Visit (HOSPITAL_BASED_OUTPATIENT_CLINIC_OR_DEPARTMENT_OTHER): Payer: Medicaid Other | Admitting: Medical

## 2016-12-21 VITALS — BP 132/80 | HR 85 | Resp 18

## 2016-12-21 VITALS — BP 139/86 | HR 94 | Temp 98.6°F | Resp 18 | Ht 67.5 in | Wt 262.3 lb

## 2016-12-21 DIAGNOSIS — C50412 Malignant neoplasm of upper-outer quadrant of left female breast: Secondary | ICD-10-CM

## 2016-12-21 DIAGNOSIS — Z171 Estrogen receptor negative status [ER-]: Secondary | ICD-10-CM

## 2016-12-21 DIAGNOSIS — Z5189 Encounter for other specified aftercare: Secondary | ICD-10-CM | POA: Diagnosis not present

## 2016-12-21 DIAGNOSIS — C773 Secondary and unspecified malignant neoplasm of axilla and upper limb lymph nodes: Secondary | ICD-10-CM | POA: Diagnosis not present

## 2016-12-21 DIAGNOSIS — Z452 Encounter for adjustment and management of vascular access device: Secondary | ICD-10-CM | POA: Diagnosis present

## 2016-12-21 DIAGNOSIS — Z5111 Encounter for antineoplastic chemotherapy: Secondary | ICD-10-CM

## 2016-12-21 DIAGNOSIS — Z95828 Presence of other vascular implants and grafts: Secondary | ICD-10-CM

## 2016-12-21 DIAGNOSIS — T451X5A Adverse effect of antineoplastic and immunosuppressive drugs, initial encounter: Secondary | ICD-10-CM

## 2016-12-21 LAB — COMPREHENSIVE METABOLIC PANEL
ALBUMIN: 3.6 g/dL (ref 3.5–5.0)
ALK PHOS: 77 U/L (ref 40–150)
ALT: 19 U/L (ref 0–55)
ANION GAP: 9 meq/L (ref 3–11)
AST: 21 U/L (ref 5–34)
BILIRUBIN TOTAL: 0.89 mg/dL (ref 0.20–1.20)
BUN: 6.2 mg/dL — AB (ref 7.0–26.0)
CALCIUM: 9.2 mg/dL (ref 8.4–10.4)
CO2: 26 mEq/L (ref 22–29)
CREATININE: 0.7 mg/dL (ref 0.6–1.1)
Chloride: 105 mEq/L (ref 98–109)
EGFR: >60 mL/min/{1.73_m2} (ref >60)
Glucose: 102 mg/dl (ref 70–140)
Potassium: 3.6 mEq/L (ref 3.5–5.1)
Sodium: 141 mEq/L (ref 136–145)
Total Protein: 6.7 g/dL (ref 6.4–8.3)

## 2016-12-21 LAB — CBC WITH DIFFERENTIAL/PLATELET
BASO%: 0.3 % (ref 0.0–2.0)
BASOS ABS: 0 10*3/uL (ref 0.0–0.1)
EOS%: 0.7 % (ref 0.0–7.0)
Eosinophils Absolute: 0 10*3/uL (ref 0.0–0.5)
HEMATOCRIT: 30.4 % — AB (ref 34.8–46.6)
HEMOGLOBIN: 9.8 g/dL — AB (ref 11.6–15.9)
LYMPH#: 0.8 10*3/uL — AB (ref 0.9–3.3)
LYMPH%: 26.9 % (ref 14.0–49.7)
MCH: 31.9 pg (ref 25.1–34.0)
MCHC: 32.2 g/dL (ref 31.5–36.0)
MCV: 99 fL (ref 79.5–101.0)
MONO#: 0.2 10*3/uL (ref 0.1–0.9)
MONO%: 8.1 % (ref 0.0–14.0)
NEUT#: 1.9 10*3/uL (ref 1.5–6.5)
NEUT%: 64 % (ref 38.4–76.8)
PLATELETS: 153 10*3/uL (ref 145–400)
RBC: 3.07 10*6/uL — ABNORMAL LOW (ref 3.70–5.45)
RDW: 18.2 % — AB (ref 11.2–14.5)
WBC: 3 10*3/uL — ABNORMAL LOW (ref 3.9–10.3)

## 2016-12-21 MED ORDER — METHYLPREDNISOLONE SODIUM SUCC 125 MG IJ SOLR
125.0000 mg | Freq: Once | INTRAMUSCULAR | Status: AC | PRN
  Administered 2016-12-21: 125 mg via INTRAVENOUS

## 2016-12-21 MED ORDER — SODIUM CHLORIDE 0.9 % IV SOLN
Freq: Once | INTRAVENOUS | Status: AC | PRN
  Administered 2016-12-21: 17:00:00 via INTRAVENOUS

## 2016-12-21 MED ORDER — PALONOSETRON HCL INJECTION 0.25 MG/5ML
INTRAVENOUS | Status: AC
  Filled 2016-12-21: qty 5

## 2016-12-21 MED ORDER — CARBOPLATIN CHEMO INJECTION 450 MG/45ML
300.0000 mg | Freq: Once | INTRAVENOUS | Status: AC
  Administered 2016-12-21: 300 mg via INTRAVENOUS
  Filled 2016-12-21: qty 30

## 2016-12-21 MED ORDER — SODIUM CHLORIDE 0.9% FLUSH
10.0000 mL | INTRAVENOUS | Status: DC | PRN
  Filled 2016-12-21: qty 10

## 2016-12-21 MED ORDER — SODIUM CHLORIDE 0.9% FLUSH
10.0000 mL | Freq: Once | INTRAVENOUS | Status: AC
  Administered 2016-12-21: 10 mL
  Filled 2016-12-21: qty 10

## 2016-12-21 MED ORDER — FAMOTIDINE IN NACL 20-0.9 MG/50ML-% IV SOLN
INTRAVENOUS | Status: AC
  Filled 2016-12-21: qty 50

## 2016-12-21 MED ORDER — PALONOSETRON HCL INJECTION 0.25 MG/5ML
0.2500 mg | Freq: Once | INTRAVENOUS | Status: AC
  Administered 2016-12-21: 0.25 mg via INTRAVENOUS

## 2016-12-21 MED ORDER — DIPHENHYDRAMINE HCL 50 MG/ML IJ SOLN
INTRAMUSCULAR | Status: AC
  Filled 2016-12-21: qty 1

## 2016-12-21 MED ORDER — SODIUM CHLORIDE 0.9 % IV SOLN
Freq: Once | INTRAVENOUS | Status: AC
Start: 2016-12-21 — End: 2016-12-21
  Administered 2016-12-21: 14:00:00 via INTRAVENOUS

## 2016-12-21 MED ORDER — SODIUM CHLORIDE 0.9 % IV SOLN
20.0000 mg | Freq: Once | INTRAVENOUS | Status: AC
  Administered 2016-12-21: 20 mg via INTRAVENOUS
  Filled 2016-12-21: qty 2

## 2016-12-21 MED ORDER — PEGFILGRASTIM 6 MG/0.6ML ~~LOC~~ PSKT
6.0000 mg | PREFILLED_SYRINGE | Freq: Once | SUBCUTANEOUS | Status: AC
  Administered 2016-12-21: 6 mg via SUBCUTANEOUS

## 2016-12-21 MED ORDER — DIPHENHYDRAMINE HCL 50 MG/ML IJ SOLN
25.0000 mg | Freq: Once | INTRAMUSCULAR | Status: AC | PRN
  Administered 2016-12-21: 50 mg via INTRAVENOUS

## 2016-12-21 MED ORDER — SODIUM CHLORIDE 0.9 % IV SOLN
80.0000 mg/m2 | Freq: Once | INTRAVENOUS | Status: AC
  Administered 2016-12-21: 186 mg via INTRAVENOUS
  Filled 2016-12-21: qty 31

## 2016-12-21 MED ORDER — HEPARIN SOD (PORK) LOCK FLUSH 100 UNIT/ML IV SOLN
500.0000 [IU] | Freq: Once | INTRAVENOUS | Status: AC | PRN
  Administered 2016-12-21: 500 [IU]
  Filled 2016-12-21: qty 5

## 2016-12-21 MED ORDER — FAMOTIDINE IN NACL 20-0.9 MG/50ML-% IV SOLN
20.0000 mg | Freq: Once | INTRAVENOUS | Status: AC
  Administered 2016-12-21: 20 mg via INTRAVENOUS

## 2016-12-21 MED ORDER — PEGFILGRASTIM 6 MG/0.6ML ~~LOC~~ PSKT
PREFILLED_SYRINGE | SUBCUTANEOUS | Status: AC
  Filled 2016-12-21: qty 0.6

## 2016-12-21 MED ORDER — DIPHENHYDRAMINE HCL 50 MG/ML IJ SOLN
25.0000 mg | Freq: Once | INTRAMUSCULAR | Status: AC
  Administered 2016-12-21: 25 mg via INTRAVENOUS

## 2016-12-21 MED ORDER — FAMOTIDINE IN NACL 20-0.9 MG/50ML-% IV SOLN
20.0000 mg | Freq: Once | INTRAVENOUS | Status: AC | PRN
  Administered 2016-12-21: 20 mg via INTRAVENOUS

## 2016-12-21 NOTE — Progress Notes (Unsigned)
1710:Pt c/o nausea and flushing to her face. Pt receiving Carboplatin via patent port. Carboplatin stopped and IVF started.  1712: Pt given solumedrol; see MAR. Lucianne Lei, PA at bedside to evaluate pt. Pt c/o flushing to her chest at this time also; red hives noted to mid chest. Pt denies any shortness of breath or other complaints. Pt awake and alert talking at all times. Vitals as charted.  1716: Pt noted to be anxious and encouraged to take slow and steady breaths. Pepcid given per protocol; see MAR. Lucianne Lei, PA remains at bedside.  1720: Pt continues to have redness and flushing and agrees to benadryl at this time; see MAR for medication administration.  Vitals as charted. Pt encouraged to take slow and steady breaths.  1727: Pt redness from cheeks starting to decrease and pt appears more comfortable. Pt vitals as charted. Per Lucianne Lei; not going to restart Carboplatin since such small volume remains; pt agreeable to plan. Pt ok to receive IVF at this time. IVF running without difficulty.  1746: Pt ambulatory to bathroom without difficulty.  1804: Pt resting in chair with unlabored and equal respirations. Pt denies any complaints at this time. Redness gone from cheeks and pt chest rash has diminished.

## 2016-12-21 NOTE — Patient Instructions (Signed)
   [facility] Discharge Instructions for Patients Receiving Chemotherapy  Today you received the following chemotherapy agents Taxol and Carboplatin   To help prevent nausea and vomiting after your treatment, we encourage you to take your nausea medication as directed.    If you develop nausea and vomiting that is not controlled by your nausea medication, call the clinic.   BELOW ARE SYMPTOMS THAT SHOULD BE REPORTED IMMEDIATELY:  *FEVER GREATER THAN 100.5 F  *CHILLS WITH OR WITHOUT FEVER  NAUSEA AND VOMITING THAT IS NOT CONTROLLED WITH YOUR NAUSEA MEDICATION  *UNUSUAL SHORTNESS OF BREATH  *UNUSUAL BRUISING OR BLEEDING  TENDERNESS IN MOUTH AND THROAT WITH OR WITHOUT PRESENCE OF ULCERS  *URINARY PROBLEMS  *BOWEL PROBLEMS  UNUSUAL RASH Items with * indicate a potential emergency and should be followed up as soon as possible.  Feel free to call the clinic should you have any questions or concerns. The clinic phone number is [phone].  Please show the CHEMO ALERT CARD at check-in to the Emergency Department and triage nurse.   

## 2016-12-22 NOTE — Progress Notes (Signed)
[facility] Clinic Progress Note   [name] [id number] July 28, 1970 46 y.o.  [name] is managed by [name]   Actively treated with chemotherapy: yes  Current Therapy: Carboplatin and Taxol   Last Treated: 12/21/2016   Assessment: Plan:    Adverse effect of chemotherapy, initial encounter  [name] was seen in the infusion room for a suspected chemotherapy reaction. She was receiving Carboplatin at the time of her reaction. She had received all of her Taxol and all but < 40 ml of Carboplatin prior to onset of symptoms. Her symptoms included: flushing, facial and neck erythema, and hives. Carboplatin was stopped and [name] was given solumedrol 125 mg IV and Benadryl 50 mg IV after onset of her symptoms. [name] did  respond to intervention. The patient did not receive the last < 40 ml of carboplatin.  Please see After Visit Summary for patient specific instructions.  Future Appointments  Date Time Provider [place]  01/02/2017  1:10 PM GI-315 MR 1 [id number] [facility] [facility]  01/07/2017  1:45 PM [facility] LAB 2 [facility] None  01/07/2017  2:30 PM Magrinat, [name], MD [facility] None  01/07/2017  3:30 PM [facility] C10 [facility] None  01/28/2017  2:00 PM [facility] FLUSH NURSE 2 [facility] None    No orders of the defined types were placed in this encounter.      Subjective:   Patient ID:  [name] is a 46 y.o. (DOB 1970-10-01) female.  Chief Complaint: No chief complaint on file.   HPI [name] was seen in the infusion room for a suspected chemotherapy reaction. She was receiving Carboplatin at the time of her reaction. She had received all of her Taxol and all but < 40 ml of Carboplatin prior to onset of symptoms. Her symptoms included: flushing, facial and neck erythema, and hives. Carboplatin was stopped and [name] was given solumedrol 125 mg IV and Benadryl 50 mg IV after onset of her  symptoms. [name] did  respond to intervention. The patient did not receive the last < 40 ml of carboplatin.  Medications: I have reviewed the patient's current medications.  Allergies: No Known Allergies  Past Medical History:  Diagnosis Date  . Breast cancer ([place])    lf breast  . Malignant neoplasm of upper-outer quadrant of left breast in female, estrogen receptor negative ([place]) 06/23/2016    Past Surgical History:  Procedure Laterality Date  . ANKLE SURGERY    . IR FLUORO GUIDE PORT INSERTION RIGHT  07/14/2016  . IR US GUIDE VASC ACCESS RIGHT  07/14/2016  . TUBAL LIGATION      Family History  Problem Relation Age of Onset  . Diabetes Mother   . Hypertension Mother   . Arthritis Mother   . Colon cancer Father 52       d.76 metastatic at time of diagnosis  . Diabetes Father   . Heart disease Father   . Hypertension Father   . Breast cancer Maternal Grandmother 19       d.60s  . Breast cancer Maternal Aunt 52  . Cervical cancer Maternal Aunt 68  . Breast cancer Maternal Aunt 46       d.50s  . Cancer Maternal Uncle        d.62s unspecified type of cancer  . Breast cancer Cousin 64       paternal first-cousin (daughter of unaffected aunt)  . Cancer Maternal Aunt 48       "Female  Cancer"  . Cancer Maternal Aunt        unknown cancer    Social History   Socioeconomic History  . Marital status: Married    Spouse name: Not on file  . Number of children: Not on file  . Years of education: Not on file  . Highest education level: Not on file  Social Needs  . Financial resource strain: Not on file  . Food insecurity - worry: Not on file  . Food insecurity - inability: Not on file  . Transportation needs - medical: Not on file  . Transportation needs - non-medical: Not on file  Occupational History  . Not on file  Tobacco Use  . Smoking status: Never Smoker  . Smokeless tobacco: Never Used  Substance and Sexual Activity  . Alcohol use: No  . Drug use: No    . Sexual activity: Not on file  Other Topics Concern  . Not on file  Social History Narrative  . Not on file    Past Medical History, Surgical history, Social history, and Family history were reviewed and updated as appropriate.   Please see review of systems for further details on the patient's review from today.   Review of Systems:  Review of Systems  Constitutional: Negative for chills, diaphoresis and fever.  HENT: Negative for congestion and trouble swallowing.   Respiratory: Negative for cough, chest tightness and shortness of breath.   Cardiovascular: Negative for chest pain and leg swelling.  Gastrointestinal: Negative for nausea and vomiting.  Skin: Positive for color change and rash.    Objective:   Physical Exam:  There were no vitals taken for this visit. ECOG: 0  Physical Exam  Constitutional: No distress.  HENT:  Head: Atraumatic.  Mouth/Throat: Oropharynx is clear and moist. No oropharyngeal exudate.  Cardiovascular: Normal rate, regular rhythm and normal heart sounds. Exam reveals no gallop and no friction rub.  No murmur heard. Pulmonary/Chest: Effort normal and breath sounds normal. No respiratory distress. She has no wheezes. She has no rales.  Neurological: She is alert. Coordination normal.  Skin: Skin is warm and dry. Rash noted. She is not diaphoretic. There is erythema.       Lab Review:     Component Value Date/Time   NA 141 12/21/2016 1206   K 3.6 12/21/2016 1206   CL 105 10/26/2016 0409   CO2 26 12/21/2016 1206   GLUCOSE 102 12/21/2016 1206   BUN 6.2 (L) 12/21/2016 1206   CREATININE 0.7 12/21/2016 1206   CALCIUM 9.2 12/21/2016 1206   PROT 6.7 12/21/2016 1206   ALBUMIN 3.6 12/21/2016 1206   AST 21 12/21/2016 1206   ALT 19 12/21/2016 1206   ALKPHOS 77 12/21/2016 1206   BILITOT 0.89 12/21/2016 1206   GFRNONAA >60 10/26/2016 0409   GFRAA >60 10/26/2016 0409       Component Value Date/Time   WBC 3.0 (L) 12/21/2016 1206   WBC  3.2 (L) 10/26/2016 0409   RBC 3.07 (L) 12/21/2016 1206   RBC 2.74 (L) 10/26/2016 0409   HGB 9.8 (L) 12/21/2016 1206   HCT 30.4 (L) 12/21/2016 1206   PLT 153 12/21/2016 1206   MCV 99.0 12/21/2016 1206   MCH 31.9 12/21/2016 1206   MCH 30.7 10/26/2016 0409   MCHC 32.2 12/21/2016 1206   MCHC 33.2 10/26/2016 0409   RDW 18.2 (H) 12/21/2016 1206   LYMPHSABS 0.8 (L) 12/21/2016 1206   MONOABS 0.2 12/21/2016 1206   EOSABS 0.0 12/21/2016 1206  BASOSABS 0.0 12/21/2016 1206   -------------------------------  Imaging from last 24 hours (if applicable):  Radiology interpretation: Dg Chest 2 View  Result Date: 12/08/2016 CLINICAL DATA:  Fever. Shortness of breath. Chemotherapy. Breast cancer . EXAM: CHEST  2 VIEW COMPARISON:  10/24/2016. FINDINGS: PowerPort catheter with lead tip over the superior vena cava. Mediastinum hilar structures normal. Heart size normal. No focal infiltrate. No pleural effusion or pneumothorax. No focal bony abnormality. IMPRESSION: 1. PowerPort catheter noted with lead tip over the superior vena cava. 2.  No acute cardiopulmonary disease. Electronically Signed   By: Marcello Moores  Register   On: 12/08/2016 06:51

## 2016-12-24 ENCOUNTER — Ambulatory Visit: Payer: Medicaid Other | Admitting: Oncology

## 2016-12-24 ENCOUNTER — Other Ambulatory Visit: Payer: Medicaid Other

## 2016-12-24 ENCOUNTER — Ambulatory Visit: Payer: Medicaid Other

## 2016-12-31 ENCOUNTER — Other Ambulatory Visit: Payer: Medicaid Other

## 2016-12-31 ENCOUNTER — Ambulatory Visit: Payer: Medicaid Other

## 2017-01-01 ENCOUNTER — Other Ambulatory Visit: Payer: Medicaid Other

## 2017-01-01 ENCOUNTER — Ambulatory Visit: Payer: Medicaid Other

## 2017-01-02 ENCOUNTER — Ambulatory Visit
Admission: RE | Admit: 2017-01-02 | Discharge: 2017-01-02 | Disposition: A | Discharge status: Home / Self Care | Payer: Medicaid Other | Source: Ambulatory Visit | Attending: Oncology | Admitting: Oncology

## 2017-01-02 DIAGNOSIS — C50412 Malignant neoplasm of upper-outer quadrant of left female breast: Secondary | ICD-10-CM

## 2017-01-02 DIAGNOSIS — Z171 Estrogen receptor negative status [ER-]: Principal | ICD-10-CM

## 2017-01-02 MED ORDER — GADOBENATE DIMEGLUMINE 529 MG/ML IV SOLN
20.0000 mL | Freq: Once | INTRAVENOUS | Status: AC | PRN
  Administered 2017-01-02: 20 mL via INTRAVENOUS

## 2017-01-04 ENCOUNTER — Telehealth: Payer: Self-pay | Admitting: Adult Health

## 2017-01-04 NOTE — Telephone Encounter (Signed)
Reviewed MRI results with Teyonna in detail over the phone.  She verbalized understanding and appreciation of call.    Wilber Bihari, NP

## 2017-01-05 DIAGNOSIS — Z923 Personal history of irradiation: Secondary | ICD-10-CM

## 2017-01-05 DIAGNOSIS — Z9221 Personal history of antineoplastic chemotherapy: Secondary | ICD-10-CM

## 2017-01-05 HISTORY — DX: Personal history of antineoplastic chemotherapy: Z92.21

## 2017-01-05 HISTORY — DX: Personal history of irradiation: Z92.3

## 2017-01-07 ENCOUNTER — Encounter: Payer: Self-pay | Admitting: *Deleted

## 2017-01-07 ENCOUNTER — Other Ambulatory Visit (HOSPITAL_BASED_OUTPATIENT_CLINIC_OR_DEPARTMENT_OTHER): Payer: Medicaid Other

## 2017-01-07 ENCOUNTER — Ambulatory Visit (HOSPITAL_BASED_OUTPATIENT_CLINIC_OR_DEPARTMENT_OTHER): Payer: Medicaid Other | Admitting: Oncology

## 2017-01-07 ENCOUNTER — Other Ambulatory Visit: Payer: Self-pay | Admitting: General Surgery

## 2017-01-07 ENCOUNTER — Telehealth: Payer: Self-pay | Admitting: Oncology

## 2017-01-07 ENCOUNTER — Ambulatory Visit: Payer: Medicaid Other

## 2017-01-07 VITALS — BP 142/80 | HR 108 | Temp 98.7°F | Resp 20 | Wt 264.6 lb

## 2017-01-07 DIAGNOSIS — C773 Secondary and unspecified malignant neoplasm of axilla and upper limb lymph nodes: Secondary | ICD-10-CM

## 2017-01-07 DIAGNOSIS — Z171 Estrogen receptor negative status [ER-]: Principal | ICD-10-CM

## 2017-01-07 DIAGNOSIS — C50412 Malignant neoplasm of upper-outer quadrant of left female breast: Secondary | ICD-10-CM

## 2017-01-07 DIAGNOSIS — Z6841 Body Mass Index (BMI) 40.0 and over, adult: Secondary | ICD-10-CM

## 2017-01-07 LAB — COMPREHENSIVE METABOLIC PANEL
ALT: 24 U/L (ref 0–55)
ANION GAP: 10 meq/L (ref 3–11)
AST: 23 U/L (ref 5–34)
Albumin: 3.7 g/dL (ref 3.5–5.0)
Alkaline Phosphatase: 105 U/L (ref 40–150)
BUN: 6.2 mg/dL — ABNORMAL LOW (ref 7.0–26.0)
CHLORIDE: 103 meq/L (ref 98–109)
CO2: 27 meq/L (ref 22–29)
CREATININE: 0.7 mg/dL (ref 0.6–1.1)
Calcium: 9.4 mg/dL (ref 8.4–10.4)
EGFR: >60 mL/min/{1.73_m2} (ref >60)
Glucose: 148 mg/dl — ABNORMAL HIGH (ref 70–140)
POTASSIUM: 3.5 meq/L (ref 3.5–5.1)
Sodium: 140 mEq/L (ref 136–145)
Total Bilirubin: 0.84 mg/dL (ref 0.20–1.20)
Total Protein: 7 g/dL (ref 6.4–8.3)

## 2017-01-07 LAB — CBC WITH DIFFERENTIAL/PLATELET
BASO%: 0.3 % (ref 0.0–2.0)
Basophils Absolute: 0 10*3/uL (ref 0.0–0.1)
EOS%: 0.5 % (ref 0.0–7.0)
Eosinophils Absolute: 0 10*3/uL (ref 0.0–0.5)
HCT: 33.4 % — ABNORMAL LOW (ref 34.8–46.6)
HGB: 10.9 g/dL — ABNORMAL LOW (ref 11.6–15.9)
LYMPH%: 14.4 % (ref 14.0–49.7)
MCH: 32.4 pg (ref 25.1–34.0)
MCHC: 32.6 g/dL (ref 31.5–36.0)
MCV: 99.4 fL (ref 79.5–101.0)
MONO#: 0.4 10*3/uL (ref 0.1–0.9)
MONO%: 6.3 % (ref 0.0–14.0)
NEUT#: 4.9 10*3/uL (ref 1.5–6.5)
NEUT%: 78.5 % — AB (ref 38.4–76.8)
PLATELETS: 141 10*3/uL — AB (ref 145–400)
RBC: 3.36 10*6/uL — AB (ref 3.70–5.45)
RDW: 16.4 % — ABNORMAL HIGH (ref 11.2–14.5)
WBC: 6.3 10*3/uL (ref 3.9–10.3)
lymph#: 0.9 10*3/uL (ref 0.9–3.3)

## 2017-01-07 NOTE — Telephone Encounter (Signed)
Patient scheduled per 1/3 los. Patient will receive update in MyChart.

## 2017-01-07 NOTE — Progress Notes (Signed)
[place]  [phone] [phone]) [phone]     ID: [name] DOB: 1970/07/02  MR#: [id number]  EVO#:[id number]  Patient Care Team: [name], MD as PCP - General (Internal Medicine) [name], MD as Consulting Physician (General Surgery) Kamali Nephew, [name], MD as Consulting Physician (Oncology) [name], MD as Attending Physician (Radiation Oncology) OTHER MD:  CHIEF COMPLAINT: Triple negative breast cancer  CURRENT TREATMENT: Neoadjuvant chemotherapy  INTERVAL HISTORY: [name] returns today for a follow-up of her triple negative breast cancer.  She has just been restaged with a repeat breast MRI with very favorable results.  REVIEW OF SYSTEMS: [name] is doing well overall. She went to [facility] for the week with her family for the holiday, which she enjoyed very much. She states that her energy level is not 100%, and she still will experience some SOB when going up the stairs. However, she has been walking a lot and each time she adds on five more minutes each time. She is eager to get back to the gym. She is interested in the the yoga and tai chi classes for exercise. [name] will be having a lumpectomy in the next couple of weeks. At that time her port will be removed and a radioactive seed implant will be placed. She denies unusual headaches, visual changes, nausea, vomiting, or dizziness. There has been no unusual cough, phlegm production, or pleurisy. This been no change in bowel or bladder habits. She denies unexplained fatigue or unexplained weight loss, bleeding, rash, or fever. A detailed review of systems was otherwise entirely stable.   BREAST CANCER HISTORY: From the original intake note:  The patient was seen in the emergency room 05/16/2016 with nonspecific complaints but also noting that she had a tender lump in her lateral left breast which she said she had noted the day before. Exam by the emergency room physician  confirmed a 3 cm soft left lateral breast mass without overlying erythema or nipple changes. This was felt to be most consistent with fibrocystic change but the patient was referred back to her primary care physician for further evaluation. She saw the physician assistant in May 18 and then Dr. Wynetta Emery on 06/12/2016 who scheduled a bilateral diagnostic mammography with tomography and left breast ultrasonography at the [facility] 06/18/2016. This found the breast density to be category B. In the left breast upper outer quadrant there was an area of asymmetry measuring up to 10.3 cm. On exam this was firm and palpable and on ultrasound there was an irregular mass measuring 2.5 cm with some subtle changes in the surrounding tissue. In the left lower axilla there were 2 suspicious-looking lymph nodes.  Biopsy of the left breast upper outer quadrant and one of the suspicious lymph nodes 06/19/2016 showed ([id number]) both to be involved by invasive ductal carcinoma, grade 3, estrogen and progesterone receptor negative, HER-2 not amplified with a signals ratio being 1.53-1.74 and the number per cell 2.35-3.05. The MIB-1 was 90%.  The patient's subsequent history is as detailed below.   PAST MEDICAL HISTORY: Past Medical History:  Diagnosis Date  . Breast cancer ([place])    lf breast  . Malignant neoplasm of upper-outer quadrant of left breast in female, estrogen receptor negative ([place]) 06/23/2016    PAST SURGICAL HISTORY: Past Surgical History:  Procedure Laterality Date  . ANKLE SURGERY    . IR FLUORO GUIDE PORT INSERTION RIGHT  07/14/2016  . IR US GUIDE VASC ACCESS RIGHT  07/14/2016  .  TUBAL LIGATION      FAMILY HISTORY Family History  Problem Relation Age of Onset  . Diabetes Mother   . Hypertension Mother   . Arthritis Mother   . Colon cancer Father 68       d.76 metastatic at time of diagnosis  . Diabetes Father   . Heart disease Father   . Hypertension Father   . Breast cancer  Maternal Grandmother 57       d.60s  . Breast cancer Maternal Aunt 52  . Cervical cancer Maternal Aunt 21  . Breast cancer Maternal Aunt 46       d.50s  . Cancer Maternal Uncle        d.62s unspecified type of cancer  . Breast cancer Cousin 16       paternal first-cousin (daughter of unaffected aunt)  . Cancer Maternal Aunt 20       "Female Cancer"  . Cancer Maternal Aunt        unknown cancer  The patient's father died at age 67 from colon cancer which she never had treated. The patient's mother is living, 48 years old as of June 2018. The patient has one brother, one sister. On the mother's side there is a history of breast cancer in the grandmother, age 65, and then 1 aunt with breast cancer age 11, and another with lung cancer age 49. There is no history of ovarian cancer in the family.  GYNECOLOGIC HISTORY:  No LMP recorded. Menarche age 43, first live birth age 17. She is GX P4. Her periods have never been regular. She had a period in June 2018 but not for 4 months before that. She took birth control for some years remotely with no complications. She is status post bilateral tubal ligation.  SOCIAL HISTORY:  The patient's husband owns and runs a food truck in the area. They serve mostly [place] and [place] food. 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 Her husband [name] (goes by "[name]") is originally from [name]. The patient has two children from her first marriage. Sarah Board "Tye Khan, who is 48 years old, as of June 2018, who was recently in the [employer], but now is taking courses at [employer] and [name], who is studying psychology at [name]. Together with [name] the patient has two children who are [name] and hila, aged 25 and 12.     ADVANCED DIRECTIVES: Not in place   HEALTH MAINTENANCE: Social History   Tobacco Use  . Smoking status: Never Smoker  . Smokeless  tobacco: Never Used  Substance Use Topics  . Alcohol use: No  . Drug use: No     Colonoscopy:Never   YBO:FBPZ 2018  Bone density:Never   No Known Allergies  Current Outpatient Medications  Medication Sig Dispense Refill  . acetaminophen (TYLENOL) 500 MG tablet Take 2 tablets (1,000 mg total) by mouth every 6 (six) hours as needed for mild pain. For fever/pain    . docusate sodium (COLACE) 100 MG capsule Take 1 capsule (100 mg total) by mouth 2 (two) times daily. 60 capsule 2  . lidocaine-prilocaine (EMLA) cream Apply to affected area once 30 g 3  . loratadine (CLARITIN) 10 MG tablet Take 10 mg by mouth daily. On chemo days    . prochlorperazine (COMPAZINE) 10 MG tablet Take 10 mg by mouth every 6 (six) hours as needed for nausea or vomiting. On day of  and three days after chemo    . traZODone (DESYREL) 50 MG tablet Take 100 mg by mouth at bedtime as needed for sleep.    . traZODone (DESYREL) 50 MG tablet TAKE 1 TABLET BY MOUTH AT BEDTIME AS NEEDED FOR SLEEP FOR 2 NIGHTS, THEN OK TO INCREASE TO 2 TABLETS EACH NIGHT AT BEDTIME 60 tablet 0   No current facility-administered medications for this visit.    Facility-Administered Medications Ordered in Other Visits  Medication Dose Route Frequency Provider Last Rate Last Dose  . sodium chloride flush (NS) 0.9 % injection 10 mL  10 mL Intracatheter PRN Leslie Jester, [name], MD        OBJECTIVE: Middle-aged white woman who appears stated age  Vitals:   01/07/17 1404  BP: (!) 142/80  Pulse: (!) 108  Resp: 20  Temp: 98.7 F (37.1 C)  SpO2: 100%     Body mass index is 40.83 kg/m.   Filed Weights   01/07/17 1404  Weight: 264 lb 9.6 oz (120 kg)   ECOG FS:0 - Asymptomatic  Exam was not repeated today  LAB RESULTS:  CMP     Component Value Date/Time   NA 140 01/07/2017 1342   K 3.5 01/07/2017 1342   CL 105 10/26/2016 0409   CO2 27 01/07/2017 1342   GLUCOSE 148 (H) 01/07/2017 1342   BUN 6.2 (L) 01/07/2017 1342   CREATININE  0.7 01/07/2017 1342   CALCIUM 9.4 01/07/2017 1342   PROT 7.0 01/07/2017 1342   ALBUMIN 3.7 01/07/2017 1342   AST 23 01/07/2017 1342   ALT 24 01/07/2017 1342   ALKPHOS 105 01/07/2017 1342   BILITOT 0.84 01/07/2017 1342   GFRNONAA >60 10/26/2016 0409   GFRAA >60 10/26/2016 0409    No results found for: TOTALPROTELP, ALBUMINELP, A1GS, A2GS, BETS, BETA2SER, GAMS, MSPIKE, SPEI  No results found for: KPAFRELGTCHN, LAMBDASER, KAPLAMBRATIO  Lab Results  Component Value Date   WBC 6.3 01/07/2017   NEUTROABS 4.9 01/07/2017   HGB 10.9 (L) 01/07/2017   HCT 33.4 (L) 01/07/2017   MCV 99.4 01/07/2017   PLT 141 (L) 01/07/2017      Chemistry      Component Value Date/Time   NA 140 01/07/2017 1342   K 3.5 01/07/2017 1342   CL 105 10/26/2016 0409   CO2 27 01/07/2017 1342   BUN 6.2 (L) 01/07/2017 1342   CREATININE 0.7 01/07/2017 1342      Component Value Date/Time   CALCIUM 9.4 01/07/2017 1342   ALKPHOS 105 01/07/2017 1342   AST 23 01/07/2017 1342   ALT 24 01/07/2017 1342   BILITOT 0.84 01/07/2017 1342       No results found for: LABCA2  No components found for: [id number]  No results for input(s): INR in the last 168 hours.  Urinalysis    Component Value Date/Time   COLORURINE YELLOW 10/24/2016 1621   APPEARANCEUR CLEAR 10/24/2016 1621   LABSPEC 1.025 11/23/2016 1251   PHURINE 6.0 11/23/2016 1251   PHURINE 5.5 10/24/2016 1621   GLUCOSEU Negative 11/23/2016 1251   HGBUR Negative 11/23/2016 1251   HGBUR NEGATIVE 10/24/2016 1621   BILIRUBINUR Negative 11/23/2016 1251   KETONESUR Negative 11/23/2016 [place] 10/24/2016 1621   PROTEINUR Negative 11/23/2016 1251   PROTEINUR NEGATIVE 10/24/2016 1621   UROBILINOGEN 0.2 11/23/2016 1251   NITRITE Negative 11/23/2016 1251   NITRITE NEGATIVE 10/24/2016 1621   LEUKOCYTESUR Negative 11/23/2016 1251     STUDIES: Mr Breast Bilateral W  Wo Contrast  Result Date: 01/04/2017 CLINICAL DATA:  Follow-up known left  breast cancer. LABS:  None EXAM: BILATERAL BREAST MRI WITH AND WITHOUT CONTRAST TECHNIQUE: Multiplanar, multisequence MR images of both breasts were obtained prior to and following the intravenous administration of 20 ml of MultiHance. THREE-DIMENSIONAL MR IMAGE RENDERING ON INDEPENDENT WORKSTATION: Three-dimensional MR images were rendered by post-processing of the original MR data on an independent workstation. The three-dimensional MR images were interpreted, and findings are reported in the following complete MRI report for this study. Three dimensional images were evaluated at the independent DynaCad workstation COMPARISON:  Previous exam(s). FINDINGS: Breast composition: b. Scattered fibroglandular tissue. Background parenchymal enhancement: Mild Right breast: A few scattered foci are identified of no significance. No suspicious findings in the right breast. Left breast: A few scattered foci are identified of no significance. No abnormal enhancement is identified in the region of the biopsied malignancy in the left breast. The biopsy clip is seen in the left breast at 3 o'clock with no abnormal enhancement seen in this region on the 20 second dynamic imaging. On the comparison MRI, there was significant surrounding non mass enhancement spanning at least 10 cm in AP dimension and 7 cm in cranial caudal dimension. This surrounding non mass enhancement has significantly improved. However, there is still mild increased enhancement in the lateral left breast compared to the right. The most prominent remaining non mass enhancement is seen on series 12, image 82 spanning 1.9 cm. Lymph nodes: The previously identified abnormal left axillary nodes have resolved. No abnormal nodes identified. Ancillary findings:  None. IMPRESSION: 1. Significant interval improvement on the left. Specifically, no abnormal enhancement remains at the site of biopsied malignancy. There is still mild asymmetric enhancement in the lateral  left breast compared to the remainder of the breasts which correlates with the site of previous non mass enhancement on the comparison. The most prominent remaining non mass enhancement is seen on series 12, image 82 spanning 1.9 cm. 2. No abnormal lymph nodes are identified on this study. The 3 abnormal nodes on the previous study have returned to normal size. 3. No MRI evidence of malignancy in the right breast. RECOMMENDATION: Continued surgical and oncologic follow up. BI-RADS CATEGORY  6: Known biopsy-proven malignancy. Electronically Signed   By: Dorise Bullion III M.D   On: 01/04/2017 10:25    ELIGIBLE FOR AVAILABLE RESEARCH PROTOCOL: no   ASSESSMENT: 47 y.o. 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 Additional testing of the patient's mother and maternal aunt is pending. Otherwise [facility]'s Common Hereditary Cancers Panel found no deleterious mutations in APC, ATM, AXIN2, BARD1, BMPR1A, BRCA1, BRCA2, BRIP1, CDH1, CDKN2A, CHEK2, CTNNA1, DICER1, EPCAM, GREM1, HOXB13, KIT, MEN1, MLH1, MSH2, MSH3, MSH6, MUTYH, NBN, NF1, NTHL1, PALB2, PDGFRA, PMS2, POLD1, POLE, PTEN, RAD50, RAD51C, RAD51D, SDHA, SDHB, SDHC, SDHD, SMAD4, SMARCA4, STK11, TP53, TSC1, TSC2, and VHL.  (2) neoadjuvant chemotherapy to consist of doxorubicin and cyclophosphamide in dose dense fashion 4 completed 09/10/2016 to be followed by paclitaxel weekly 12 given with carboplatin   (a) cycle 4 of cyclophosphamide and doxorubicin was delayed 10 days and dose decreased 10% because of febrile  neutropenia after cycle 3  (b) cycle 6 of Paclitaxel  and Carboplatin delayed due to neutropenia, therefore Granix added to Wednesday, Thursday, Friday following chemotherapy days  (3) definitive surgery to follow  (4) adjuvant radiation to follow surgery  PLAN: I spent approximately 30 minutes with Tyreesha reviewing the MRI images and discussing the surgical options.  At this point she is expecting a lumpectomy and that seems entirely reasonable.  She understands she will need radiation postop.  She expects to be scheduled for the seed placement sometime next week  I do not see any point with her receiving one last chemo today.  That would only complicate the surgical planning.  Accordingly the chemo today has been canceled.  She will see me today approximately 10 days after her surgery, which will give [place] time to present her case again in the multidisciplinary breast cancer clinic.  While waiting for her surgery I encouraged her in our tai chi classes here.  She knows to call for any other issues that may develop before the next visit.  Lilianne Delair, [name], MD  01/07/17 2:26 PM Medical Oncology and Hematology [facility] [address] [place], [place] [address] Tel. [phone]    Fax. [phone]  This document serves as a record of services personally performed by Chauncey Cruel, MD. It was created on his behalf by Margit Banda, a trained medical scribe. The creation of this record is based on the scribe's personal observations and the provider's statements to them.   I have reviewed the above documentation for accuracy and completeness, and I agree with the above.

## 2017-01-08 NOTE — Telephone Encounter (Signed)
Patient declined AVS and calendar of upcoming appointments. Will receive update in MyChart.

## 2017-01-12 ENCOUNTER — Other Ambulatory Visit: Payer: Self-pay | Admitting: General Surgery

## 2017-01-12 ENCOUNTER — Other Ambulatory Visit: Payer: Self-pay | Admitting: *Deleted

## 2017-01-12 DIAGNOSIS — Z171 Estrogen receptor negative status [ER-]: Principal | ICD-10-CM

## 2017-01-12 DIAGNOSIS — C50412 Malignant neoplasm of upper-outer quadrant of left female breast: Secondary | ICD-10-CM

## 2017-01-14 ENCOUNTER — Telehealth: Payer: Self-pay | Admitting: Oncology

## 2017-01-14 NOTE — Telephone Encounter (Signed)
Cancelled 1/24 flush due to patient getting port removed - spoke with patient regarding these changes.

## 2017-01-22 NOTE — Pre-Procedure Instructions (Signed)
[name]  01/22/2017      [place], [place] [address] Phone: [phone] Fax: [phone]    Your procedure is scheduled on Wednesday, January 27, 2017  Report to [facility] Admitting Entrance "A" at 8:00AM   Call this number if you have problems the morning of surgery:  [phone]   Remember:  Do not eat food or drink liquids after midnight.  Take these medicines the morning of surgery with A SIP OF WATER: If needed Acetaminophen (TYLENOL) for pain.  As of today, stop taking all Aspirins, Vitamins, Fish oils, and Herbal medications. Also stop all NSAIDS i.e. Advil, Ibuprofen, Motrin, Aleve, Anaprox, Naproxen, BC and Goody Powders.  Please complete your PRE-SURGERY ENSURE that was given to before you leave your house the morning of surgery.  Please, if able, drink it in one setting. DO NOT SIP.   Do not wear jewelry, make-up or nail polish.  Do not wear lotions, powders, perfumes, or deodorant.  Do not shave 48 hours prior to surgery.    Do not bring valuables to the hospital.  [facility] is not responsible for any belongings or valuables.  Contacts, dentures or bridgework may not be worn into surgery.  Leave your suitcase in the car.  After surgery it may be brought to your room.  For patients admitted to the hospital, discharge time will be determined by your treatment team.  Patients discharged the day of surgery will not be allowed to drive home.   Special instructions:  [place]- Preparing For Surgery  Before surgery, you can play an important role. Because skin is not sterile, your skin needs to be as free of germs as possible. You can reduce the number of germs on your skin by washing with CHG (chlorahexidine gluconate) Soap before surgery.  CHG is an antiseptic cleaner which kills germs and bonds with the skin to continue killing germs even after  washing.  Please do not use if you have an allergy to CHG or antibacterial soaps. If your skin becomes reddened/irritated stop using the CHG.  Do not shave (including legs and underarms) for at least 48 hours prior to first CHG shower. It is OK to shave your face.  Please follow these instructions carefully.   1. Shower the NIGHT BEFORE SURGERY and the MORNING OF SURGERY with CHG.   2. If you chose to wash your hair, wash your hair first as usual with your normal shampoo.  3. After you shampoo, rinse your hair and body thoroughly to remove the shampoo.  4. Use CHG as you would any other liquid soap. You can apply CHG directly to the skin and wash gently with a scrungie or a clean washcloth.   5. Apply the CHG Soap to your body ONLY FROM THE NECK DOWN.  Do not use on open wounds or open sores. Avoid contact with your eyes, ears, mouth and genitals (private parts). Wash Face and genitals (private parts)  with your normal soap.  6. Wash thoroughly, paying special attention to the area where your surgery will be performed.  7. Thoroughly rinse your body with warm water from the neck down.  8. DO NOT shower/wash with your normal soap after using and rinsing off the CHG Soap.  9. Pat yourself dry with a CLEAN TOWEL.  10. Wear CLEAN PAJAMAS to bed the night before surgery, wear comfortable clothes the morning  of surgery  11. Place CLEAN SHEETS on your bed the night of your first shower and DO NOT SLEEP WITH PETS.  Day of Surgery: Do not apply any deodorants/lotions. Please wear clean clothes to the hospital/surgery center.    Please read over the following fact sheets that you were given. Pain Booklet, Coughing and Deep Breathing and Surgical Site Infection Prevention

## 2017-01-24 NOTE — H&P (Signed)
[name] Location: [facility] Patient #: [id number] DOB: May 11, 1970 Married / Language: English / Race: White Female      History of Present Illness        This is a 47 year old female who returns following neoadjuvant chemotherapy to discuss and plan definitive left breast surgery. [name] and Dr. [name] are involved in her care. [name] is her PCP. Imaging location is [facility].      Initially evaluated on July 01, 2016 at which time imaging studies showed a 10 cm area of asymmetry in the lateral left breast in an area of palpable abnormality. Ultrasound showed a 6.5 cm mass 7 cm from the nipple but it felt much larger. Ultrasound showed 2 abnormal axillary nodes. Image guided biopsy showed left breast invasive ductal carcinoma, high-grade, triple negative breast cancer. Image guided biopsy of one of the lymph node showed metastatic cancer. Interventional radiology placed her port. 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 She is seeing [name] today. MRI shows a very good response with just some vague density in the lateral left breast. Her axillary lymph nodes looked normal.     She is motivated for breast conservation surgery and I think we can try to do that.     Comorbidities include BMI of 40, anxiety, incisional hernia at umbilicus, hypertension Family history reveals maternal grandmother had breast cancer. Father died of metastatic colon cancer. Mother living with diabetes Social history reveals she is married. One son and one daughter     We had a long discussion about surgical management. We talked about mastectomy with or without reconstruction, lumpectomy, radiation therapy, targeted axillary dissection. She favors breast conservation and I think that is reasonable Were going to check with [name] about removing the port but he may be through with chemotherapy.      She'll  be scheduled for injection blue dye left breast, left breast lumpectomy with radioactive seed localization, excision targeted deep left axillary lymph node with radioactive seed, standard left axillary sentinel lymph node mapping and biopsy, and removal of Port-A-Cath We discussed the indications, details, techniques, and numerous risk of this surgery. She is aware the risks of bleeding, infection, reoperation for positive margins or multiple positive nodes, arm swelling, arm numbness, cosmetic deformity, bleeding, infection. She understands all these issues well. All of her questions were answered. She agrees with this plan.   Addendum Note [name] confirmed that we may remove the port.   Allergies  No Known Drug Allergies  Allergies Reconciled   Medication History  Prochlorperazine Maleate (10MG  Tablet, Oral) Active. Medications Reconciled  Vitals  Weight: 264 lb Height: 67.5in Body Surface Area: 2.29 m Body Mass Index: 40.74 kg/m  Temp.: 97.67F  Pulse: 99 (Regular)  BP: 130/82 (Sitting, Left Arm, Standard)       Physical Exam  General Mental Status-Alert. General Appearance-Not in acute distress. Build & Nutrition-Well nourished. Posture-Normal posture. Gait-Normal. Note: BMI 40. Pleasant. Cooperative.   Head and Neck Head-normocephalic, atraumatic with no lesions or palpable masses. Trachea-midline. Thyroid Gland Characteristics - normal size and consistency and no palpable nodules.  Chest and Lung Exam Chest and lung exam reveals -on auscultation, normal breath sounds, no adventitious sounds and normal vocal resonance.  Breast Note: Breasts are large. Right breast reveals no skin change, mass, or adenopathy. Port palpable right infraclavicular area. Left breast reveals no palpable mass or axillary adenopathy. No arm edema. Good range of motion.  Cardiovascular Cardiovascular examination reveals -normal heart  sounds, regular rate and rhythm with no murmurs and femoral artery auscultation bilaterally reveals normal pulses, no bruits, no thrills.  Abdomen Inspection Inspection of the abdomen reveals - Note: 2.5 cm partially incarcerated incisional hernia at the umbilicus. Nontender. No inflammation. Present for years. Transverse scar below umbilicus. Palpation/Percussion Palpation and Percussion of the abdomen reveal - Soft, Non Tender, No Rigidity (guarding), No hepatosplenomegaly and No Palpable abdominal masses.  Neurologic Neurologic evaluation reveals -alert and oriented x 3 with no impairment of recent or remote memory, normal attention span and ability to concentrate, normal sensation and normal coordination.  Musculoskeletal Normal Exam - Bilateral-Upper Extremity Strength Normal and Lower Extremity Strength Normal.    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 today but I suspect that he has completed your chemotherapy and that we can remove your Port-A-Cath at the same time We will ask him to be sure  you will be scheduled for left breast lumpectomy with radioactive seed localization, left axillary deep targeted lymph node biopsy with radioactive seed, left axillary sentinel lymph node biopsy, removal of Port-A-Cath We have discussed the indications, techniques, and risks of this  surgery in detail  BMI 40.0-44.9, ADULT (Z68.41) HYPERTENSION, ESSENTIAL (S50) UMBILICAL HERNIA (N39.7)    Jeffrie Stander M. Dalbert Batman, M.D., [facility] General and Minimally invasive Surgery Breast and Colorectal Surgery Office:   [phone] Pager:   [phone]

## 2017-01-25 ENCOUNTER — Encounter (HOSPITAL_COMMUNITY): Payer: Self-pay

## 2017-01-25 ENCOUNTER — Other Ambulatory Visit: Payer: Self-pay | Admitting: General Surgery

## 2017-01-25 ENCOUNTER — Encounter (HOSPITAL_COMMUNITY)
Admission: RE | Admit: 2017-01-25 | Discharge: 2017-01-25 | Disposition: A | Discharge status: Home / Self Care | Payer: Medicaid Other | Source: Ambulatory Visit | Attending: General Surgery | Admitting: General Surgery

## 2017-01-25 ENCOUNTER — Other Ambulatory Visit: Payer: Self-pay

## 2017-01-25 DIAGNOSIS — Z01812 Encounter for preprocedural laboratory examination: Secondary | ICD-10-CM | POA: Diagnosis present

## 2017-01-25 DIAGNOSIS — I10 Essential (primary) hypertension: Secondary | ICD-10-CM | POA: Insufficient documentation

## 2017-01-25 DIAGNOSIS — K429 Umbilical hernia without obstruction or gangrene: Secondary | ICD-10-CM | POA: Diagnosis not present

## 2017-01-25 DIAGNOSIS — C50412 Malignant neoplasm of upper-outer quadrant of left female breast: Secondary | ICD-10-CM | POA: Diagnosis not present

## 2017-01-25 HISTORY — DX: Adverse effect of unspecified anesthetic, initial encounter: T41.45XA

## 2017-01-25 HISTORY — DX: Essential (primary) hypertension: I10

## 2017-01-25 HISTORY — DX: Other complications of anesthesia, initial encounter: T88.59XA

## 2017-01-25 LAB — CBC WITH DIFFERENTIAL/PLATELET
BASOS ABS: 0.1 10*3/uL (ref 0.0–0.1)
BASOS PCT: 1 %
EOS ABS: 0.1 10*3/uL (ref 0.0–0.7)
Eosinophils Relative: 2 %
HEMATOCRIT: 39.3 % (ref 36.0–46.0)
Hemoglobin: 13.1 g/dL (ref 12.0–15.0)
Lymphocytes Relative: 20 %
Lymphs Abs: 1 10*3/uL (ref 0.7–4.0)
MCH: 31.5 pg (ref 26.0–34.0)
MCHC: 33.3 g/dL (ref 30.0–36.0)
MCV: 94.5 fL (ref 78.0–100.0)
MONOS PCT: 7 %
Monocytes Absolute: 0.4 10*3/uL (ref 0.1–1.0)
NEUTROS ABS: 3.6 10*3/uL (ref 1.7–7.7)
NEUTROS PCT: 70 %
Platelets: 181 10*3/uL (ref 150–400)
RBC: 4.16 MIL/uL (ref 3.87–5.11)
RDW: 13.4 % (ref 11.5–15.5)
WBC: 5.2 10*3/uL (ref 4.0–10.5)

## 2017-01-25 LAB — COMPREHENSIVE METABOLIC PANEL
ALBUMIN: 4.2 g/dL (ref 3.5–5.0)
ALK PHOS: 96 U/L (ref 38–126)
ALT: 29 U/L (ref 14–54)
ANION GAP: 14 (ref 5–15)
AST: 35 U/L (ref 15–41)
BUN: 8 mg/dL (ref 6–20)
CO2: 24 mmol/L (ref 22–32)
Calcium: 9.7 mg/dL (ref 8.9–10.3)
Chloride: 97 mmol/L — ABNORMAL LOW (ref 101–111)
Creatinine, Ser: 0.75 mg/dL (ref 0.44–1.00)
GFR calc Af Amer: >60 mL/min (ref >60)
GFR calc non Af Amer: >60 mL/min (ref >60)
GLUCOSE: 116 mg/dL — AB (ref 65–99)
Potassium: 3.5 mmol/L (ref 3.5–5.1)
SODIUM: 135 mmol/L (ref 135–145)
Total Bilirubin: 1.2 mg/dL (ref 0.3–1.2)
Total Protein: 7.6 g/dL (ref 6.5–8.1)

## 2017-01-25 LAB — HCG, SERUM, QUALITATIVE: Preg, Serum: NEGATIVE

## 2017-01-25 NOTE — Progress Notes (Signed)
SPOKE WITH NURSE AT [facility] RE: PATIENT HAVING A QUESTION ABOUT PROCEDURE.  PATIENT THOUGHT  DR. INGRAM WAS GOING TO " REMOVE THE 3 AXILLARY LYMPH NODES THAT SHOWED CANCER."  NURSE STATED SHE WOULD ASK DR. INGRAM IF HE WOULD CALL PATIENT AT HOME.

## 2017-01-26 ENCOUNTER — Ambulatory Visit
Admission: RE | Admit: 2017-01-26 | Discharge: 2017-01-26 | Disposition: A | Discharge status: Home / Self Care | Payer: Medicaid Other | Source: Ambulatory Visit | Attending: General Surgery | Admitting: General Surgery

## 2017-01-26 ENCOUNTER — Other Ambulatory Visit: Payer: Self-pay | Admitting: General Surgery

## 2017-01-26 DIAGNOSIS — C50412 Malignant neoplasm of upper-outer quadrant of left female breast: Secondary | ICD-10-CM

## 2017-01-26 DIAGNOSIS — Z171 Estrogen receptor negative status [ER-]: Principal | ICD-10-CM

## 2017-01-26 MED ORDER — CEFAZOLIN SODIUM 10 G IJ SOLR
3.0000 g | INTRAMUSCULAR | Status: AC
  Administered 2017-01-27: 3 g via INTRAVENOUS
  Filled 2017-01-26: qty 3000

## 2017-01-27 ENCOUNTER — Ambulatory Visit
Admission: RE | Admit: 2017-01-27 | Discharge: 2017-01-27 | Disposition: A | Discharge status: Home / Self Care | Payer: Medicaid Other | Source: Ambulatory Visit | Attending: General Surgery | Admitting: General Surgery

## 2017-01-27 ENCOUNTER — Ambulatory Visit (HOSPITAL_COMMUNITY): Payer: Medicaid Other | Admitting: Anesthesiology

## 2017-01-27 ENCOUNTER — Encounter (HOSPITAL_COMMUNITY)
Admission: RE | Disposition: A | Discharge status: Home / Self Care | Payer: Self-pay | Source: Ambulatory Visit | Attending: General Surgery

## 2017-01-27 ENCOUNTER — Ambulatory Visit (HOSPITAL_COMMUNITY)
Admission: RE | Admit: 2017-01-27 | Discharge: 2017-01-27 | Disposition: A | Discharge status: Home / Self Care | Payer: Medicaid Other | Source: Ambulatory Visit | Attending: General Surgery | Admitting: General Surgery

## 2017-01-27 ENCOUNTER — Encounter (HOSPITAL_COMMUNITY): Payer: Self-pay

## 2017-01-27 DIAGNOSIS — I10 Essential (primary) hypertension: Secondary | ICD-10-CM | POA: Diagnosis not present

## 2017-01-27 DIAGNOSIS — F419 Anxiety disorder, unspecified: Secondary | ICD-10-CM | POA: Diagnosis not present

## 2017-01-27 DIAGNOSIS — Z803 Family history of malignant neoplasm of breast: Secondary | ICD-10-CM | POA: Insufficient documentation

## 2017-01-27 DIAGNOSIS — C773 Secondary and unspecified malignant neoplasm of axilla and upper limb lymph nodes: Secondary | ICD-10-CM | POA: Diagnosis not present

## 2017-01-27 DIAGNOSIS — C50412 Malignant neoplasm of upper-outer quadrant of left female breast: Secondary | ICD-10-CM | POA: Diagnosis not present

## 2017-01-27 DIAGNOSIS — Z452 Encounter for adjustment and management of vascular access device: Secondary | ICD-10-CM | POA: Diagnosis not present

## 2017-01-27 DIAGNOSIS — Z6841 Body Mass Index (BMI) 40.0 and over, adult: Secondary | ICD-10-CM | POA: Insufficient documentation

## 2017-01-27 DIAGNOSIS — N6489 Other specified disorders of breast: Secondary | ICD-10-CM | POA: Diagnosis present

## 2017-01-27 DIAGNOSIS — Z171 Estrogen receptor negative status [ER-]: Principal | ICD-10-CM

## 2017-01-27 DIAGNOSIS — Z9221 Personal history of antineoplastic chemotherapy: Secondary | ICD-10-CM | POA: Diagnosis not present

## 2017-01-27 HISTORY — PX: BREAST LUMPECTOMY: SHX2

## 2017-01-27 HISTORY — PX: BREAST LUMPECTOMY WITH RADIOACTIVE SEED AND SENTINEL LYMPH NODE BIOPSY: SHX6550

## 2017-01-27 HISTORY — PX: PORT-A-CATH REMOVAL: SHX5289

## 2017-01-27 SURGERY — BREAST LUMPECTOMY WITH RADIOACTIVE SEED AND SENTINEL LYMPH NODE BIOPSY
Anesthesia: General | Site: Breast | Laterality: Right

## 2017-01-27 MED ORDER — CHLORHEXIDINE GLUCONATE CLOTH 2 % EX PADS: 6.0000 | MEDICATED_PAD | Freq: Once | CUTANEOUS | Status: DC

## 2017-01-27 MED ORDER — DEXAMETHASONE SODIUM PHOSPHATE 10 MG/ML IJ SOLN
INTRAMUSCULAR | Status: DC | PRN
  Administered 2017-01-27: 10 mg via INTRAVENOUS

## 2017-01-27 MED ORDER — SUCCINYLCHOLINE CHLORIDE 200 MG/10ML IV SOSY
PREFILLED_SYRINGE | INTRAVENOUS | Status: AC
  Filled 2017-01-27: qty 10

## 2017-01-27 MED ORDER — LIDOCAINE 2% (20 MG/ML) 5 ML SYRINGE
INTRAMUSCULAR | Status: AC
  Filled 2017-01-27: qty 5

## 2017-01-27 MED ORDER — BUPIVACAINE-EPINEPHRINE (PF) 0.5% -1:200000 IJ SOLN
INTRAMUSCULAR | Status: DC | PRN
  Administered 2017-01-27: 30 mL via PERINEURAL

## 2017-01-27 MED ORDER — FENTANYL CITRATE (PF) 250 MCG/5ML IJ SOLN
INTRAMUSCULAR | Status: AC
  Filled 2017-01-27: qty 5

## 2017-01-27 MED ORDER — GABAPENTIN 300 MG PO CAPS
300.0000 mg | ORAL_CAPSULE | ORAL | Status: AC
  Administered 2017-01-27: 300 mg via ORAL
  Filled 2017-01-27: qty 1

## 2017-01-27 MED ORDER — 0.9 % SODIUM CHLORIDE (POUR BTL) OPTIME
TOPICAL | Status: DC | PRN
  Administered 2017-01-27: 1000 mL

## 2017-01-27 MED ORDER — SODIUM CHLORIDE 0.9 % IJ SOLN
INTRAMUSCULAR | Status: AC
  Filled 2017-01-27: qty 10

## 2017-01-27 MED ORDER — SODIUM CHLORIDE 0.9 % IJ SOLN
INTRAVENOUS | Status: DC | PRN
  Administered 2017-01-27: 10:00:00 via INTRAMUSCULAR

## 2017-01-27 MED ORDER — LIDOCAINE 2% (20 MG/ML) 5 ML SYRINGE
INTRAMUSCULAR | Status: DC | PRN
  Administered 2017-01-27: 60 mg via INTRAVENOUS

## 2017-01-27 MED ORDER — MIDAZOLAM HCL 2 MG/2ML IJ SOLN
INTRAMUSCULAR | Status: AC
  Administered 2017-01-27: 2 mg via INTRAVENOUS
  Filled 2017-01-27: qty 2

## 2017-01-27 MED ORDER — LACTATED RINGERS IV SOLN
INTRAVENOUS | Status: DC | PRN
  Administered 2017-01-27 (×2): via INTRAVENOUS

## 2017-01-27 MED ORDER — FENTANYL CITRATE (PF) 100 MCG/2ML IJ SOLN
INTRAMUSCULAR | Status: DC | PRN
  Administered 2017-01-27: 100 ug via INTRAVENOUS
  Administered 2017-01-27: 50 ug via INTRAVENOUS

## 2017-01-27 MED ORDER — OXYCODONE HCL 5 MG PO TABS: 5.0000 mg | ORAL_TABLET | ORAL | Status: DC | PRN

## 2017-01-27 MED ORDER — PROPOFOL 10 MG/ML IV BOLUS
INTRAVENOUS | Status: AC
  Filled 2017-01-27: qty 20

## 2017-01-27 MED ORDER — FENTANYL CITRATE (PF) 100 MCG/2ML IJ SOLN
INTRAMUSCULAR | Status: AC
  Administered 2017-01-27: 50 ug via INTRAVENOUS
  Filled 2017-01-27: qty 2

## 2017-01-27 MED ORDER — ONDANSETRON HCL 4 MG/2ML IJ SOLN
INTRAMUSCULAR | Status: DC | PRN
  Administered 2017-01-27: 4 mg via INTRAVENOUS

## 2017-01-27 MED ORDER — ACETAMINOPHEN 500 MG PO TABS
1000.0000 mg | ORAL_TABLET | ORAL | Status: AC
  Administered 2017-01-27: 1000 mg via ORAL
  Filled 2017-01-27: qty 2

## 2017-01-27 MED ORDER — CELECOXIB 200 MG PO CAPS
200.0000 mg | ORAL_CAPSULE | ORAL | Status: AC
  Administered 2017-01-27: 200 mg via ORAL
  Filled 2017-01-27: qty 1

## 2017-01-27 MED ORDER — METHYLENE BLUE 0.5 % INJ SOLN
INTRAVENOUS | Status: AC
  Filled 2017-01-27: qty 10

## 2017-01-27 MED ORDER — FENTANYL CITRATE (PF) 100 MCG/2ML IJ SOLN
25.0000 ug | INTRAMUSCULAR | Status: DC | PRN
  Administered 2017-01-27 (×2): 50 ug via INTRAVENOUS

## 2017-01-27 MED ORDER — BUPIVACAINE-EPINEPHRINE 0.5% -1:200000 IJ SOLN
INTRAMUSCULAR | Status: DC | PRN
  Administered 2017-01-27: 30 mL

## 2017-01-27 MED ORDER — TECHNETIUM TC 99M SULFUR COLLOID FILTERED
1.0000 | Freq: Once | INTRAVENOUS | Status: AC | PRN
  Administered 2017-01-27: 1 via INTRADERMAL

## 2017-01-27 MED ORDER — PROPOFOL 10 MG/ML IV BOLUS
INTRAVENOUS | Status: DC | PRN
  Administered 2017-01-27: 170 mg via INTRAVENOUS

## 2017-01-27 MED ORDER — MIDAZOLAM HCL 5 MG/5ML IJ SOLN
INTRAMUSCULAR | Status: DC | PRN
  Administered 2017-01-27: 2 mg via INTRAVENOUS

## 2017-01-27 MED ORDER — FENTANYL CITRATE (PF) 100 MCG/2ML IJ SOLN
INTRAMUSCULAR | Status: AC
  Administered 2017-01-27: 100 ug via INTRAVENOUS
  Filled 2017-01-27: qty 2

## 2017-01-27 MED ORDER — MIDAZOLAM HCL 2 MG/2ML IJ SOLN
2.0000 mg | Freq: Once | INTRAMUSCULAR | Status: AC
  Administered 2017-01-27: 2 mg via INTRAVENOUS
  Filled 2017-01-27: qty 2

## 2017-01-27 MED ORDER — HYDROCODONE-ACETAMINOPHEN 5-325 MG PO TABS: 1.0000 | ORAL_TABLET | Freq: Four times a day (QID) | ORAL | 0 refills | Status: DC | PRN

## 2017-01-27 MED ORDER — ONDANSETRON HCL 4 MG/2ML IJ SOLN
INTRAMUSCULAR | Status: AC
  Filled 2017-01-27: qty 2

## 2017-01-27 MED ORDER — BUPIVACAINE-EPINEPHRINE (PF) 0.5% -1:200000 IJ SOLN
INTRAMUSCULAR | Status: AC
  Filled 2017-01-27: qty 30

## 2017-01-27 MED ORDER — LIDOCAINE-EPINEPHRINE 1 %-1:100000 IJ SOLN
INTRAMUSCULAR | Status: AC
  Filled 2017-01-27: qty 1

## 2017-01-27 MED ORDER — MIDAZOLAM HCL 2 MG/2ML IJ SOLN
INTRAMUSCULAR | Status: AC
  Filled 2017-01-27: qty 2

## 2017-01-27 MED ORDER — DEXAMETHASONE SODIUM PHOSPHATE 10 MG/ML IJ SOLN
INTRAMUSCULAR | Status: AC
  Filled 2017-01-27: qty 1

## 2017-01-27 MED ORDER — ONDANSETRON HCL 4 MG/2ML IJ SOLN
4.0000 mg | Freq: Four times a day (QID) | INTRAMUSCULAR | Status: AC | PRN
  Administered 2017-01-27: 4 mg via INTRAVENOUS

## 2017-01-27 MED ORDER — OXYCODONE HCL 5 MG PO TABS: 5.0000 mg | ORAL_TABLET | Freq: Once | ORAL | Status: DC | PRN

## 2017-01-27 MED ORDER — FENTANYL CITRATE (PF) 100 MCG/2ML IJ SOLN
100.0000 ug | Freq: Once | INTRAMUSCULAR | Status: AC
  Administered 2017-01-27: 100 ug via INTRAVENOUS
  Filled 2017-01-27: qty 2

## 2017-01-27 MED ORDER — SUCCINYLCHOLINE CHLORIDE 200 MG/10ML IV SOSY
PREFILLED_SYRINGE | INTRAVENOUS | Status: DC | PRN
  Administered 2017-01-27: 100 mg via INTRAVENOUS

## 2017-01-27 MED ORDER — OXYCODONE HCL 5 MG/5ML PO SOLN: 5.0000 mg | Freq: Once | ORAL | Status: DC | PRN

## 2017-01-27 MED FILL — HYDROCODON-APAP 5-325: 5-325 | 3 days supply | Qty: 30 | Fill #0

## 2017-01-27 SURGICAL SUPPLY — 65 items
ADH SKN CLS APL DERMABOND .7 (GAUZE/BANDAGES/DRESSINGS) ×4
APPLIER CLIP 9.375 MED OPEN (MISCELLANEOUS) ×3
APR CLP MED 9.3 20 MLT OPN (MISCELLANEOUS) ×2
BINDER BREAST 3XL (GAUZE/BANDAGES/DRESSINGS) ×1 IMPLANT
BINDER BREAST LRG (GAUZE/BANDAGES/DRESSINGS) IMPLANT
BINDER BREAST XLRG (GAUZE/BANDAGES/DRESSINGS) IMPLANT
BLADE SURG 15 STRL LF DISP TIS (BLADE) ×4 IMPLANT
BLADE SURG 15 STRL SS (BLADE) ×6
CANISTER SUCT 3000ML PPV (MISCELLANEOUS) ×3 IMPLANT
CHLORAPREP W/TINT 10.5 ML (MISCELLANEOUS) ×3 IMPLANT
CHLORAPREP W/TINT 26ML (MISCELLANEOUS) ×4 IMPLANT
CLIP APPLIE 9.375 MED OPEN (MISCELLANEOUS) ×2 IMPLANT
CONT SPEC 4OZ CLIKSEAL STRL BL (MISCELLANEOUS) ×3 IMPLANT
COVER PROBE W GEL 5X96 (DRAPES) ×3 IMPLANT
COVER SURGICAL LIGHT HANDLE (MISCELLANEOUS) ×7 IMPLANT
DECANTER SPIKE VIAL GLASS SM (MISCELLANEOUS) ×2 IMPLANT
DERMABOND ADVANCED (GAUZE/BANDAGES/DRESSINGS) ×2
DERMABOND ADVANCED .7 DNX12 (GAUZE/BANDAGES/DRESSINGS) ×2 IMPLANT
DEVICE DUBIN SPECIMEN MAMMOGRA (MISCELLANEOUS) ×3 IMPLANT
DRAPE CHEST BREAST 15X10 FENES (DRAPES) ×3 IMPLANT
DRAPE HALF SHEET 40X57 (DRAPES) ×2 IMPLANT
DRAPE LAPAROTOMY 100X72 PEDS (DRAPES) ×2 IMPLANT
DRAPE UTILITY XL STRL (DRAPES) ×3 IMPLANT
DRSG PAD ABDOMINAL 8X10 ST (GAUZE/BANDAGES/DRESSINGS) ×3 IMPLANT
ELECT CAUTERY BLADE 6.4 (BLADE) ×3 IMPLANT
ELECT REM PT RETURN 9FT ADLT (ELECTROSURGICAL) ×3
ELECTRODE REM PT RTRN 9FT ADLT (ELECTROSURGICAL) ×2 IMPLANT
FILTER STRAW FLUID ASPIR (MISCELLANEOUS) ×1 IMPLANT
GAUZE SPONGE 4X4 12PLY STRL (GAUZE/BANDAGES/DRESSINGS) ×3 IMPLANT
GAUZE SPONGE 4X4 12PLY STRL LF (GAUZE/BANDAGES/DRESSINGS) ×1 IMPLANT
GAUZE SPONGE 4X4 16PLY XRAY LF (GAUZE/BANDAGES/DRESSINGS) ×3 IMPLANT
GLOVE BIOGEL PI IND STRL 6 (GLOVE) IMPLANT
GLOVE BIOGEL PI INDICATOR 6 (GLOVE) ×1
GLOVE EUDERMIC 7 POWDERFREE (GLOVE) ×4 IMPLANT
GLOVE SURG SS PI 6.0 STRL IVOR (GLOVE) ×2 IMPLANT
GOWN STRL REUS W/ TWL LRG LVL3 (GOWN DISPOSABLE) ×2 IMPLANT
GOWN STRL REUS W/ TWL XL LVL3 (GOWN DISPOSABLE) ×2 IMPLANT
GOWN STRL REUS W/TWL LRG LVL3 (GOWN DISPOSABLE) ×3
GOWN STRL REUS W/TWL XL LVL3 (GOWN DISPOSABLE) ×3
ILLUMINATOR WAVEGUIDE N/F (MISCELLANEOUS) IMPLANT
KIT BASIN OR (CUSTOM PROCEDURE TRAY) ×3 IMPLANT
KIT MARKER MARGIN INK (KITS) ×3 IMPLANT
KIT ROOM TURNOVER OR (KITS) ×3 IMPLANT
LIGHT WAVEGUIDE WIDE FLAT (MISCELLANEOUS) IMPLANT
NDL HYPO 25GX1X1/2 BEV (NEEDLE) ×2 IMPLANT
NDL SAFETY ECLIPSE 18X1.5 (NEEDLE) IMPLANT
NEEDLE HYPO 18GX1.5 SHARP (NEEDLE)
NEEDLE HYPO 25GX1X1/2 BEV (NEEDLE) ×3 IMPLANT
NS IRRIG 1000ML POUR BTL (IV SOLUTION) ×3 IMPLANT
PACK SURGICAL SETUP 50X90 (CUSTOM PROCEDURE TRAY) ×3 IMPLANT
PAD ABD 8X10 STRL (GAUZE/BANDAGES/DRESSINGS) ×1 IMPLANT
PAD ARMBOARD 7.5X6 YLW CONV (MISCELLANEOUS) ×3 IMPLANT
PENCIL BUTTON HOLSTER BLD 10FT (ELECTRODE) ×3 IMPLANT
SPONGE LAP 4X18 X RAY DECT (DISPOSABLE) ×4 IMPLANT
SUT MNCRL AB 4-0 PS2 18 (SUTURE) ×7 IMPLANT
SUT SILK 2 0 SH (SUTURE) ×3 IMPLANT
SUT VIC AB 2-0 CT1 27 (SUTURE) ×3
SUT VIC AB 2-0 CT1 TAPERPNT 27 (SUTURE) IMPLANT
SUT VIC AB 3-0 SH 18 (SUTURE) ×4 IMPLANT
SYR BULB 3OZ (MISCELLANEOUS) ×3 IMPLANT
SYR CONTROL 10ML LL (SYRINGE) ×4 IMPLANT
TOWEL OR 17X24 6PK STRL BLUE (TOWEL DISPOSABLE) ×2 IMPLANT
TOWEL OR 17X26 10 PK STRL BLUE (TOWEL DISPOSABLE) ×3 IMPLANT
TUBE CONNECTING 12X1/4 (SUCTIONS) ×3 IMPLANT
YANKAUER SUCT BULB TIP NO VENT (SUCTIONS) ×3 IMPLANT

## 2017-01-27 NOTE — Anesthesia Procedure Notes (Signed)
Anesthesia Regional Block: Pectoralis block   Pre-Anesthetic Checklist: ,, timeout performed, Correct Patient, Correct Site, Correct Laterality, Correct Procedure, Correct Position, site marked, Risks and benefits discussed,  Surgical consent,  Pre-op evaluation,  At surgeon's request and post-op pain management  Laterality: Left  Prep: chloraprep       Needles:  Injection technique: Single-shot  Needle Type: Echogenic Needle     Needle Length: 9cm  Needle Gauge: 21     Additional Needles:   Narrative:  Start time: 01/27/2017 9:20 AM End time: 01/27/2017 9:32 AM Injection made incrementally with aspirations every 5 mL.  Performed by: Personally  Anesthesiologist: Albertha Ghee, MD  Additional Notes: Pt tolerated the procedure well.

## 2017-01-27 NOTE — Op Note (Signed)
Patient Name:           [name]   Date of Surgery:        01/27/2017  Pre op Diagnosis:      Primary cancer left breast with metastasis to axillary lymph nodes                                       History Port-A-Cath placement                                       History neoadjuvant chemotherapy  Post op Diagnosis:    Same  Procedure:                 Inject blue dye left breast                                      Left breast lumpectomy with radioactive seed localization                                      Left axillary targeted deep axillary node biopsy with radioactive seed localization                                      Left axillary deep sentinel lymph node mapping and biopsy                                      Removal of Port-A-Cath  Surgeon:                     [name], M.D., FACS  Assistant:                      Or staff    Operative Indications:   This is a 47 year old female who is brought to the operating room following neoadjuvant chemotherapy for definitive left breast surgery. [name] and Dr. [name] are involved in her care. [name] is her PCP. Imaging location is [facility].      Initially evaluated on July 01, 2016 at which time imaging studies showed a 10 cm area of asymmetry in the lateral left breast in an area of palpable abnormality. Ultrasound showed a 6.5 cm mass 7 cm from the nipple but it felt much larger. Ultrasound showed 2 abnormal axillary nodes. Image guided biopsy showed left breast invasive ductal carcinoma, high-grade, triple negative breast cancer. Image guided biopsy of one of the lymph node showed metastatic cancer. Interventional radiology placed her port. 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 MRI shows a very good response with just some vague density in the lateral left breast. Her axillary lymph nodes looked normal.     She has completed her chemotherapy and Dr. [name] says we can remove the  port.     She is motivated for breast conservation surgery and I think we can try to do that.     Comorbidities include BMI of 40,  anxiety, incisional hernia at umbilicus, hypertension Family history reveals maternal grandmother had breast cancer. Father died of metastatic colon cancer. Mother living with diabetes     We had a long discussion about surgical management. We talked about mastectomy with or without reconstruction, lumpectomy, radiation therapy, targeted axillary dissection. She favors breast conservation and I think that is reasonable      She'll be scheduled for injection blue dye left breast, left breast lumpectomy with radioactive seed localization, excision targeted deep left axillary lymph node with radioactive seed, standard left axillary sentinel lymph node mapping and biopsy, and removal of Port-A-Cath She agrees with this plan.    Operative Findings:       The port was removed from the right infraclavicular area without difficulty.  The radioactive seed was found in the very lateral left breast and was removed with a radial linear incision.  I took a generous specimen because of the original size of the tumor.  The tissues were somewhat fibrotic and showed chemotherapy effect, but there was no gross palpable mass.  The specimen mammogram looked good containing the radioactive seed and the original marker clip.  In the left axilla I found the targeted lymph node and the imaging study confirmed the presence of the seed.  I found four deep sentinel lymph nodes that were mapped using technetium-99.  Therefore, 5 axillary nodes were removed.  Procedure in Detail:          Following the induction of general LMA anesthesia a surgical timeout was performed.  Intravenous antibiotics were given.  Following alcohol prep I injected 5 mL of dilute methylene blue into the left breast, subareolar area and massaged the breast for a few minutes.     The entire left breast, right breast, neck,  and left axilla and chest wall were prepped and draped in a sterile fashion.  0.5% Marcaine with epinephrine was used as a local infiltration anesthetic.  I made a transverse incision in the right infraclavicular area, through the old scar.  I dissected the port away from the subcutaneous tissue and the port and catheter were removed intact.  This was passed off to the circulating nurse.  There was no bleeding or infection.  The subcutaneous tissue was closed with 3-0 Vicryl sutures and the skin closed with a running subcuticular 4-0 Monocryl and Dermabond.       Using the neoprobe I identified the radioactive seed in the lateral left breast.  A transverse linear incision was made with the knife and the lumpectomy was performed with the neoprobe and electrocautery.  The specimen was removed and marked with silk sutures and a 6 color ink kit to orient the pathologist.  The specimen mammogram looked good.  The specimen was probably 7 cm in diameter.  Specimen was sent to the pathology lab where the seed was retrieved.  This wound was irrigated with saline.  Hemostasis was excellent.  I placed 5 metal clips in the walls of the lumpectomy cavity.  The breast tissues were reapproximated in several layers with interrupted sutures of 3-0 Vicryl and the skin closed with a running subcuticular 4-0 Monocryl and Dermabond.            I then made a transverse incision in the left axilla at the hairline.  Using the neoprobe on the I-125 setting I dissected down into the axillary space and identified and retrieved the targeted node..  This node was imaged on the Faxitron and contained the marker clip  and the radioactive seed.  This was so noted and sent to the lab where the seed was retrieved.  I then changed the neoprobe setting to technetium 99 and dissected out four more lymph nodes that contained blue dye and the radioactivity and that was all we had.  Hemostasis was excellent and achieved with electrocautery.  The wound  was irrigated with saline.  The clavipectoral fascia was closed with 2-0 Vicryl sutures.  The subcutaneous     tissue was closed with 3-0 Vicryl sutures and the skin closed with a running subcuticular 4-0 Monocryl and Dermabond.      All of the incisions were painted with Dermabond.  Dry bandages and a breast binder were placed.  The patient tolerated the procedure well was taken to PACU in stable condition.  EBL 25 mL.  Counts correct.  Complications none.          [name], M.D., FACS General and Minimally Invasive Surgery Breast and Colorectal Surgery    Addendum: I logged onto the [facility] website and reviewed her prescription medication history  01/27/2017 12:02 PM

## 2017-01-27 NOTE — Transfer of Care (Signed)
Immediate Anesthesia Transfer of Care Note  Patient: [name]  Procedure(s) Performed: LEFT BREAST LUMPECTOMY WITH RADIOACTIVE SEED LOCALIZATION, LEFT AXILLARY DEEP LYMPH NODE BIOPSY WITH RADIOACTIVE SEED LOCALIZATION, LEFT AXILLARY SENTINEL LYMPH NODE MAPPING AND BIOPSY WITH BLUE DYE INJECTION (Left ) REMOVAL PORT-A-CATH (N/A )  Patient Location: PACU  Anesthesia Type:General and Regional  Level of Consciousness: awake and alert   Airway & Oxygen Therapy: Patient Spontanous Breathing and Patient connected to nasal cannula oxygen  Post-op Assessment: Report given to RN, Post -op Vital signs reviewed and stable and Patient moving all extremities X 4  Post vital signs: Reviewed and stable  Last Vitals:  Vitals:   01/27/17 0810  Pulse: (!) 105  Resp: 20  Temp: 36.8 C  SpO2: 98%    Last Pain:  Vitals:   01/27/17 0810  TempSrc: Oral      Patients Stated Pain Goal: 3 (70/26/37 8588)  Complications: No apparent anesthesia complications

## 2017-01-27 NOTE — Discharge Instructions (Signed)
[facility] °Office Phone Number [phone] ° °BREAST BIOPSY/ PARTIAL MASTECTOMY: POST OP INSTRUCTIONS ° °Always review your discharge instruction sheet given to you by the facility where your surgery was performed. ° °IF YOU HAVE DISABILITY OR FAMILY LEAVE FORMS, YOU MUST BRING THEM TO THE OFFICE FOR PROCESSING.  DO NOT GIVE THEM TO YOUR DOCTOR. ° °1. A prescription for pain medication may be given to you upon discharge.  Take your pain medication as prescribed, if needed.  If narcotic pain medicine is not needed, then you may take acetaminophen (Tylenol) or ibuprofen (Advil) as needed. °2. Take your usually prescribed medications unless otherwise directed °3. If you need a refill on your pain medication, please contact your pharmacy.  They will contact our office to request authorization.  Prescriptions will not be filled after 5pm or on week-ends. °4. You should eat very light the first 24 hours after surgery, such as soup, crackers, pudding, etc.  Resume your normal diet the day after surgery. °5. Most patients will experience some swelling and bruising in the breast.  Ice packs and a good support bra will help.  Swelling and bruising can take several days to resolve.  °6. It is common to experience some constipation if taking pain medication after surgery.  Increasing fluid intake and taking a stool softener will usually help or prevent this problem from occurring.  A mild laxative (Milk of Magnesia or Miralax) should be taken according to package directions if there are no bowel movements after 48 hours. °7. Unless discharge instructions indicate otherwise, you may remove your bandages 24-48 hours after surgery, and you may shower at that time.  You may have steri-strips (small skin tapes) in place directly over the incision.  These strips should be left on the skin for 7-10 days.  If your surgeon used skin glue on the incision, you may shower in 24 hours.  The glue will flake off over the  next 2-3 weeks.  Any sutures or staples will be removed at the office during your follow-up visit. °8. ACTIVITIES:  You may resume regular daily activities (gradually increasing) beginning the next day.  Wearing a good support bra or sports bra minimizes pain and swelling.  You may have sexual intercourse when it is comfortable. °a. You may drive when you no longer are taking prescription pain medication, you can comfortably wear a seatbelt, and you can safely maneuver your car and apply brakes. °b. RETURN TO WORK:  ______________________________________________________________________________________ °9. You should see your doctor in the office for a follow-up appointment approximately two weeks after your surgery.  Your doctor’s nurse will typically make your follow-up appointment when she calls you with your pathology report.  Expect your pathology report 2-3 business days after your surgery.  You may call to check if you do not hear from us after three days. °10. OTHER INSTRUCTIONS: _______________________________________________________________________________________________ _____________________________________________________________________________________________________________________________________ °_____________________________________________________________________________________________________________________________________ °_____________________________________________________________________________________________________________________________________ ° °WHEN TO CALL YOUR DOCTOR: °1. Fever over 101.0 °2. Nausea and/or vomiting. °3. Extreme swelling or bruising. °4. Continued bleeding from incision. °5. Increased pain, redness, or drainage from the incision. ° °The clinic staff is available to answer your questions during regular business hours.  Please don’t hesitate to call and ask to speak to one of the nurses for clinical concerns.  If you have a medical emergency, go to the nearest  emergency room or call 911.  A surgeon from [facility] is always on call at the hospital. ° °For further questions, please visit [email or web address]  °

## 2017-01-27 NOTE — Interval H&P Note (Signed)
History and Physical Interval Note:  01/27/2017 8:02 AM  [name]  has presented today for surgery, with the diagnosis of left breast cancer  The various methods of treatment have been discussed with the patient and family. After consideration of risks, benefits and other options for treatment, the patient has consented to  Procedure(s): LEFT BREAST LUMPECTOMY WITH RADIOACTIVE SEED LOCALIZATION, LEFT AXILLARY DEEP LYMPH NODE BIOPSY WITH RADIOACTIVE SEED LOCALIZATION, LEFT AXILLARY SENTINEL LYMPH NODE MAPPING AND BIOPSY WITH BLUE DYE INJECTION (Left) REMOVAL PORT-A-CATH (N/A) as a surgical intervention .  The patient's history has been reviewed, patient examined, no change in status, stable for surgery.  I have reviewed the patient's chart and labs.  Questions were answered to the patient's satisfaction.     Adin Hector

## 2017-01-27 NOTE — Anesthesia Preprocedure Evaluation (Addendum)
Anesthesia Evaluation  Patient identified by MRN, date of birth, ID band Patient awake    Reviewed: Allergy & Precautions, H&P , NPO status , Patient's Chart, lab work & pertinent test results  Airway Mallampati: II   Neck ROM: full    Dental   Pulmonary neg pulmonary ROS,    breath sounds clear to auscultation       Cardiovascular hypertension,  Rhythm:regular Rate:Normal     Neuro/Psych    GI/Hepatic   Endo/Other  Morbid obesity  Renal/GU      Musculoskeletal   Abdominal   Peds  Hematology   Anesthesia Other Findings   Reproductive/Obstetrics Breast CA                             Anesthesia Physical Anesthesia Plan  ASA: II  Anesthesia Plan: General   Post-op Pain Management:  Regional for Post-op pain   Induction: Intravenous  PONV Risk Score and Plan: 3 and Ondansetron, Dexamethasone, Midazolam and Treatment may vary due to age or medical condition  Airway Management Planned: Oral ETT  Additional Equipment:   Intra-op Plan:   Post-operative Plan: Extubation in OR  Informed Consent: I have reviewed the patients History and Physical, chart, labs and discussed the procedure including the risks, benefits and alternatives for the proposed anesthesia with the patient or authorized representative who has indicated his/her understanding and acceptance.     Plan Discussed with: CRNA, Anesthesiologist and Surgeon  Anesthesia Plan Comments:        Anesthesia Quick Evaluation

## 2017-01-27 NOTE — Anesthesia Procedure Notes (Signed)
Procedure Name: Intubation Date/Time: 01/27/2017 10:17 AM Performed by: Renato Shin, CRNA Pre-anesthesia Checklist: Patient identified, Emergency Drugs available, Suction available, Patient being monitored and Timeout performed Patient Re-evaluated:Patient Re-evaluated prior to induction Oxygen Delivery Method: Circle system utilized Preoxygenation: Pre-oxygenation with 100% oxygen Induction Type: IV induction Ventilation: Mask ventilation without difficulty Laryngoscope Size: Miller and 2 Grade View: Grade I Tube type: Oral Tube size: 7.0 mm Number of attempts: 1 Airway Equipment and Method: Stylet Placement Confirmation: ETT inserted through vocal cords under direct vision Secured at: 22 cm Tube secured with: Tape Dental Injury: Teeth and Oropharynx as per pre-operative assessment

## 2017-01-28 ENCOUNTER — Telehealth: Payer: Self-pay | Admitting: *Deleted

## 2017-01-28 ENCOUNTER — Encounter (HOSPITAL_COMMUNITY): Payer: Self-pay | Admitting: General Surgery

## 2017-01-28 NOTE — Telephone Encounter (Signed)
This RN received call to inform Dr Jannifer Rodney she had her surgery yesterday - " he wanted me to call so he could reschedule my appointment scheduled for next week - until after they can present me at conference "  [name] is currently scheduled with Dr Jannifer Rodney on 1/30 - she is scheduled for Rad Onc on 2/6.  " I am available whenever he says "  [name] stated she is doing well today including " the nerve block is still active so I am not having any pain"  She stated appreciation of care provided at the surgical center " I could not have ask for better ".  She also stated difficulty in contacting the scheduling department at this office per above " I left several messages "  This RN apologized for above - and informed her to please call to this RN when she does not get a return call appropriately even with a different department so her needs can be addressed.  No further needs at this time.  This note will be sent to MD and Navigators for communication of above and for change in appointment if needed.  Pt will need a return call to verify her appointment.

## 2017-01-29 ENCOUNTER — Other Ambulatory Visit: Payer: Self-pay | Admitting: Oncology

## 2017-01-31 NOTE — Progress Notes (Signed)
Inform patient of Pathology report,. Tell her that there was no cancer left in the breast or the lymph nodes. We call this a Complete Pathologic Response, and it is excellent news. I will discuss this with her in detail at her first post op visit. I called and left a brief message on her machine to call me. Let me know that you were able to reach her. Monday. Thanks,  HMI

## 2017-02-02 ENCOUNTER — Ambulatory Visit: Payer: Medicaid Other | Admitting: Oncology

## 2017-02-02 NOTE — Progress Notes (Signed)
Location of Breast Cancer: Left Breast  Histology per Pathology Report:  06/19/16 Diagnosis 1. Breast, left, needle core biopsy, 2:00 o'clock - INVASIVE DUCTAL CARCINOMA, GRADE III. - SEE MICROSCOPIC DESCRIPTION.  Receptor Status: ER (NEG), PR (NEG), Her2-neu (NEG), Ki- (90%)  2. Lymph node, needle/core biopsy, left axilla - METASTATIC CARCINOMA. - SEE MICROSCOPIC DESCRIPTION.  Receptor Status: ER(NEG), PR (NEG), Her2-neu (NEG), Ki-(90%)  Did patient present with symptoms or was this found on screening mammography?: She presented to the ED on 05/16/16 with a self- palpated left breast mass.   Past/Anticipated interventions by surgeon, if any: 01/27/17 Procedure:                 Inject blue dye left breast                                      Left breast lumpectomy with radioactive seed localization                                      Left axillary targeted deep axillary node biopsy with radioactive seed localization                                      Left axillary deep sentinel lymph node mapping and biopsy                                      Removal of Port-A-Cath  Surgeon:                     Edsel Petrin. Dalbert Batman, M.D., FACS   Past/Anticipated interventions by medical oncology, if any:  01/08/16 [name] Neoadjuvant chemotherapy to consist of doxorubicin and cyclophosphamide in dose dense fashion 4 completed 09/10/2016 to be followed by paclitaxel weekly 12 given with carboplatin              (a) cycle 4 of cyclophosphamide and doxorubicin was delayed 10 days and dose decreased 10% because of febrile neutropenia after cycle 3             (b) cycle 6 of Paclitaxel and Carboplatin delayed due to neutropenia, therefore Granix added to Wednesday, Thursday, Friday following chemotherapy days Definitive surgery to follow (don 01/27/17) Adjuvant radiation to follow surgery  Lymphedema issues, if any:   She denies, she has good arm mobility  Pain issues, if any:  She reports  nerve pain underneath her left arm. She was started on gabapentin today by [name].   SAFETY ISSUES:  Prior radiation? No  Pacemaker/ICD? No  Possible current pregnancy? No, she has not had a period since June after starting chemotherapy.   Is the patient on methotrexate? No  Current Complaints / other details:    Vital signs from Dr. [name] doctor visit this am: Temp 98.6, BP 110/75, pulse 85, resp 18, 97% Room air O2 sat.     Kyesha Balla, Stephani Police, RN 02/02/2017,9:38 AM

## 2017-02-04 NOTE — Anesthesia Postprocedure Evaluation (Signed)
Anesthesia Post Note  Patient: [name]  Procedure(s) Performed: LEFT BREAST LUMPECTOMY WITH RADIOACTIVE SEED LOCALIZATION, LEFT AXILLARY DEEP LYMPH NODE BIOPSY WITH RADIOACTIVE SEED LOCALIZATION, LEFT AXILLARY SENTINEL LYMPH NODE MAPPING AND BIOPSY WITH BLUE DYE INJECTION (Left Breast) REMOVAL PORT-A-CATH (Right Breast)     Patient location during evaluation: PACU Anesthesia Type: General Level of consciousness: awake and alert Pain management: pain level controlled Vital Signs Assessment: post-procedure vital signs reviewed and stable Respiratory status: spontaneous breathing, nonlabored ventilation, respiratory function stable and patient connected to nasal cannula oxygen Cardiovascular status: blood pressure returned to baseline and stable Postop Assessment: no apparent nausea or vomiting Anesthetic complications: no    Last Vitals:  Vitals:   01/27/17 1245 01/27/17 1255  BP:  117/84  Pulse: 88 89  Resp: 14 12  Temp:  36.8 C  SpO2: 95% 96%    Last Pain:  Vitals:   01/27/17 1241  TempSrc:   PainSc: [name]

## 2017-02-05 NOTE — Progress Notes (Signed)
[facility]  [phone] [phone]     ID: [name] DOB: September 05, 1970  MR#: [id number]  OVA#:[id number]  Patient Care Team: [name], MD as PCP - General (Internal Medicine) [name], MD as Consulting Physician (General Surgery) Caeson Filippi, [name], MD as Consulting Physician (Oncology) [name], MD as Attending Physician (Radiation Oncology) OTHER MD:  CHIEF COMPLAINT: Triple negative breast cancer  CURRENT TREATMENT: adjuvant radiation pending  INTERVAL HISTORY: [name] returns today for a follow-up of her triple negative breast cancer accompanied by her husband. She notes that her surgery went well, but she is experiencing some discomfort in the left axilla.  Since her last visit, she underwent left lumpectomy and left axillary sentinel lymph node sampling ([id number])  on 01/27/2017 with pathology showing: fibrosis with slight chronic inflammation consistent with treatment effect. No residual carcinoma. Final margins clear. Fibrocystic changes with calcifications. (0/4) left axillary lymph nodes benign and negative for carcinoma.  She is meeting with Dr. Lanell Persons today to plan her radiation treatments   REVIEW OF SYSTEMS: [name] reports that she is having pain and burning in the left axilla. She notes that her surgery was painful. She takes ibuprofen as needed, but she notes that the pain medicine does not ease the constant nerve discomfort. She notes that she places a soft pillow under her arm, which aids this. She notes that she has good range of motion. She reports that she is working and getting back to her normal routine. She denies unusual headaches, visual changes, nausea, vomiting, or dizziness. There has been no unusual cough, phlegm production, or pleurisy. This been no change in bowel or bladder habits. She denies unexplained fatigue or unexplained weight loss, bleeding, rash, or fever. A detailed review of systems was  otherwise stable.    BREAST CANCER HISTORY: From the original intake note:  The patient was seen in the emergency room 05/16/2016 with nonspecific complaints but also noting that she had a tender lump in her lateral left breast which she said she had noted the day before. Exam by the emergency room physician confirmed a 3 cm soft left lateral breast mass without overlying erythema or nipple changes. This was felt to be most consistent with fibrocystic change but the patient was referred back to her primary care physician for further evaluation. She saw the physician assistant in May 18 and then Dr. Wynetta Emery on 06/12/2016 who scheduled a bilateral diagnostic mammography with tomography and left breast ultrasonography at the [facility] 06/18/2016. This found the breast density to be category B. In the left breast upper outer quadrant there was an area of asymmetry measuring up to 10.3 cm. On exam this was firm and palpable and on ultrasound there was an irregular mass measuring 2.5 cm with some subtle changes in the surrounding tissue. In the left lower axilla there were 2 suspicious-looking lymph nodes.  Biopsy of the left breast upper outer quadrant and one of the suspicious lymph nodes 06/19/2016 showed ([id number]) both to be involved by invasive ductal carcinoma, grade 3, estrogen and progesterone receptor negative, HER-2 not amplified with a signals ratio being 1.53-1.74 and the number per cell 2.35-3.05. The MIB-1 was 90%.  The patient's subsequent history is as detailed below.   PAST MEDICAL HISTORY: Past Medical History:  Diagnosis Date  . Breast cancer ([place])    lf breast  . Complication of anesthesia    DAY SURGERY 2010 OR 2011 ASPIRIATED AND STAYED OVERNIGHT  . Hypertension  OFF MEDS  SINCE CHEMO X 5 MONTHS  . Malignant neoplasm of upper-outer quadrant of left breast in female, estrogen receptor negative ([place]) 06/23/2016    PAST SURGICAL HISTORY: Past Surgical History:    Procedure Laterality Date  . ANKLE SURGERY    . BREAST LUMPECTOMY WITH RADIOACTIVE SEED AND SENTINEL LYMPH NODE BIOPSY Left 01/27/2017   Procedure: LEFT BREAST LUMPECTOMY WITH RADIOACTIVE SEED LOCALIZATION, LEFT AXILLARY DEEP LYMPH NODE BIOPSY WITH RADIOACTIVE SEED LOCALIZATION, LEFT AXILLARY SENTINEL LYMPH NODE MAPPING AND BIOPSY WITH BLUE DYE INJECTION;  Surgeon: [name], MD;  Location: [facility];  Service: General;  Laterality: Left;  . IR FLUORO GUIDE PORT INSERTION RIGHT  07/14/2016  . IR US GUIDE VASC ACCESS RIGHT  07/14/2016  . PORT-A-CATH REMOVAL Right 01/27/2017   Procedure: REMOVAL PORT-A-CATH;  Surgeon: [name], MD;  Location: [facility];  Service: General;  Laterality: Right;  . TUBAL LIGATION    . WISDOM TOOTH EXTRACTION      FAMILY HISTORY Family History  Problem Relation Age of Onset  . Diabetes Mother   . Hypertension Mother   . Arthritis Mother   . Colon cancer Father 45       d.76 metastatic at time of diagnosis  . Diabetes Father   . Heart disease Father   . Hypertension Father   . Breast cancer Maternal Grandmother 63       d.60s  . Breast cancer Maternal Aunt 52  . Cervical cancer Maternal Aunt 53  . Breast cancer Maternal Aunt 46       d.50s  . Cancer Maternal Uncle        d.62s unspecified type of cancer  . Breast cancer Cousin 32       paternal first-cousin (daughter of unaffected aunt)  . Cancer Maternal Aunt 60       "Female Cancer"  . Cancer Maternal Aunt        unknown cancer  The patient's father died at age 109 from colon cancer which she never had treated. The patient's mother is living, 29 years old as of June 2018. The patient has one brother, one sister. On the mother's side there is a history of breast cancer in the grandmother, age 46, and then 1 aunt with breast cancer age 15, and another with lung cancer age 43. There is no history of ovarian cancer in the family.  GYNECOLOGIC HISTORY:  No LMP recorded. Menarche age 60, first live  birth age 14. She is GX P4. Her periods have never been regular. She had a period in June 2018 but not for 4 months before that. She took birth control for some years remotely with no complications. She is status post bilateral tubal ligation.  SOCIAL HISTORY:  The patient's husband owns and runs a food truck in the area. They serve mostly [place] and [place] food. 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 Her husband Marquise Lambson (goes by "[name]") is originally from [name]. The patient has two children from her first marriage. [name], who is 48 years old, as of June 2018, who was recently in the [employer], but now is taking courses at [employer] and [name], who is studying psychology at [name] Together with [name] the patient has two children who are [name] and hila, aged 22 and 33.     ADVANCED DIRECTIVES: Not in place   HEALTH MAINTENANCE: Social History  Tobacco Use  . Smoking status: Never Smoker  . Smokeless tobacco: Never Used  Substance Use Topics  . Alcohol use: No  . Drug use: No     Colonoscopy:Never   JXB:JYNW 2018  Bone density:Never   Allergies  Allergen Reactions  . Carboplatin Other (See Comments)    Red, blotchy, hot    Current Outpatient Medications  Medication Sig Dispense Refill  . docusate sodium (COLACE) 100 MG capsule Take 1 capsule (100 mg total) by mouth 2 (two) times daily. 60 capsule 2  . HYDROcodone-acetaminophen (NORCO) 5-325 MG tablet Take 1-2 tablets by mouth every 6 (six) hours as needed for moderate pain or severe pain. 30 tablet 0   No current facility-administered medications for this visit.    Facility-Administered Medications Ordered in Other Visits  Medication Dose Route Frequency Provider Last Rate Last Dose  . sodium chloride flush (NS) 0.9 % injection 10 mL  10 mL Intracatheter PRN Noreene Boreman, [name], MD        OBJECTIVE:  Middle-aged white woman in no acute distress  Vitals:   02/10/17 0819  BP: 110/75  Pulse: 85  Resp: 18  Temp: 98.6 F (37 C)  SpO2: 97%     Body mass index is 40.38 kg/m.   Filed Weights   02/10/17 0819  Weight: 257 lb 12.8 oz (116.9 kg)   ECOG FS:1 - Symptomatic but completely ambulatory  Sclerae unicteric, pupils round and equal No cervical or supraclavicular adenopathy Lungs no rales or rhonchi Heart regular rate and rhythm Abd soft, obese, nontender, positive bowel sounds MSK no focal spinal tenderness, no left upper extremity lymphedema Neuro: nonfocal, well oriented, positive affect Breasts: The right breast is unremarkable.  The left breast is status post surgery.  The incisions are healing nicely.  The cosmetic result is good.  There is some induration in the medial inferior aspect of the incision which will be the new baseline.  Both axillae are benign   LAB RESULTS:  CMP     Component Value Date/Time   NA 135 01/25/2017 1447   NA 140 01/07/2017 1342   K 3.5 01/25/2017 1447   K 3.5 01/07/2017 1342   CL 97 (L) 01/25/2017 1447   CO2 24 01/25/2017 1447   CO2 27 01/07/2017 1342   GLUCOSE 116 (H) 01/25/2017 1447   GLUCOSE 148 (H) 01/07/2017 1342   BUN 8 01/25/2017 1447   BUN 6.2 (L) 01/07/2017 1342   CREATININE 0.75 01/25/2017 1447   CREATININE 0.7 01/07/2017 1342   CALCIUM 9.7 01/25/2017 1447   CALCIUM 9.4 01/07/2017 1342   PROT 7.6 01/25/2017 1447   PROT 7.0 01/07/2017 1342   ALBUMIN 4.2 01/25/2017 1447   ALBUMIN 3.7 01/07/2017 1342   AST 35 01/25/2017 1447   AST 23 01/07/2017 1342   ALT 29 01/25/2017 1447   ALT 24 01/07/2017 1342   ALKPHOS 96 01/25/2017 1447   ALKPHOS 105 01/07/2017 1342   BILITOT 1.2 01/25/2017 1447   BILITOT 0.84 01/07/2017 1342   GFRNONAA >60 01/25/2017 1447   GFRAA >60 01/25/2017 1447    No results found for: TOTALPROTELP, ALBUMINELP, A1GS, A2GS, BETS, BETA2SER, GAMS, MSPIKE, SPEI  No results found for: KPAFRELGTCHN,  LAMBDASER, KAPLAMBRATIO  Lab Results  Component Value Date   WBC 5.2 01/25/2017   NEUTROABS 3.6 01/25/2017   HGB 13.1 01/25/2017   HCT 39.3 01/25/2017   MCV 94.5 01/25/2017   PLT 181 01/25/2017      Chemistry  Component Value Date/Time   NA 135 01/25/2017 1447   NA 140 01/07/2017 1342   K 3.5 01/25/2017 1447   K 3.5 01/07/2017 1342   CL 97 (L) 01/25/2017 1447   CO2 24 01/25/2017 1447   CO2 27 01/07/2017 1342   BUN 8 01/25/2017 1447   BUN 6.2 (L) 01/07/2017 1342   CREATININE 0.75 01/25/2017 1447   CREATININE 0.7 01/07/2017 1342      Component Value Date/Time   CALCIUM 9.7 01/25/2017 1447   CALCIUM 9.4 01/07/2017 1342   ALKPHOS 96 01/25/2017 1447   ALKPHOS 105 01/07/2017 1342   AST 35 01/25/2017 1447   AST 23 01/07/2017 1342   ALT 29 01/25/2017 1447   ALT 24 01/07/2017 1342   BILITOT 1.2 01/25/2017 1447   BILITOT 0.84 01/07/2017 1342       No results found for: LABCA2  No components found for: WSFKCL275  No results for input(s): INR in the last 168 hours.  Urinalysis    Component Value Date/Time   COLORURINE YELLOW 10/24/2016 1621   APPEARANCEUR CLEAR 10/24/2016 1621   LABSPEC 1.025 11/23/2016 1251   PHURINE 6.0 11/23/2016 1251   PHURINE 5.5 10/24/2016 1621   GLUCOSEU Negative 11/23/2016 1251   HGBUR Negative 11/23/2016 1251   HGBUR NEGATIVE 10/24/2016 1621   BILIRUBINUR Negative 11/23/2016 1251   KETONESUR Negative 11/23/2016 1251   KETONESUR NEGATIVE 10/24/2016 1621   PROTEINUR Negative 11/23/2016 1251   PROTEINUR NEGATIVE 10/24/2016 1621   UROBILINOGEN 0.2 11/23/2016 1251   NITRITE Negative 11/23/2016 1251   NITRITE NEGATIVE 10/24/2016 1621   LEUKOCYTESUR Negative 11/23/2016 1251     STUDIES: Nm Sentinel Node Inj-no Rpt (breast)  Result Date: 01/27/2017 Sulfur colloid was injected by the nuclear medicine technologist for melanoma sentinel node.   Mm Breast Surgical Specimen  Result Date: 01/27/2017 CLINICAL DATA:  Post left axillary  lymph node dissection. EXAM: SPECIMEN RADIOGRAPH OF THE LEFT AXILLA COMPARISON:  Previous exam(s). FINDINGS: Status post excision of the left breast. The radioactive seed and spiral shaped biopsy marker clip are present, and completely intact. IMPRESSION: Specimen radiograph of the left axilla. Electronically Signed   By: Fidela Salisbury M.D.   On: 01/27/2017 12:25   Mm Breast Surgical Specimen  Result Date: 01/27/2017 CLINICAL DATA:  Evaluate surgical specimen following excision of left breast invasive ductal carcinoma. EXAM: SPECIMEN RADIOGRAPH OF THE LEFT BREAST COMPARISON:  Previous exam(s). FINDINGS: Status post excision of the left breast. The radioactive seed and biopsy marker clip are present, completely intact, and were marked for pathology. IMPRESSION: Specimen radiograph of the left breast. Electronically Signed   By: Margarette Canada M.D.   On: 01/27/2017 11:39   Mm Lt Radioactive Seed Loc Mammo Guide  Result Date: 01/26/2017 CLINICAL DATA:  Pre lumpectomy localization of invasive ductal carcinoma in the 2 o'clock position of the left breast treated with neoadjuvant chemotherapy. EXAM: MAMMOGRAPHIC GUIDED RADIOACTIVE SEED LOCALIZATION OF THE LEFT BREAST COMPARISON:  Previous exam(s). FINDINGS: Patient presents for radioactive seed localization prior to left lumpectomy. I met with the patient and we discussed the procedure of seed localization including benefits and alternatives. We discussed the high likelihood of a successful procedure. We discussed the risks of the procedure including infection, bleeding, tissue injury and further surgery. We discussed the low dose of radioactivity involved in the procedure. Informed, written consent was given. The usual time-out protocol was performed immediately prior to the procedure. Using mammographic guidance, sterile technique, 1% lidocaine and an I-125 radioactive seed,  the previously placed ribbon shaped biopsy marker clip in the 2 o'clock position of  the left breast was localized using a lateral approach. The follow-up mammogram images confirm the seed in the expected location and were marked for Dr. Dalbert Batman. Follow-up survey of the patient confirms presence of the radioactive seed. Order number of I-125 seed:  [id number]. Total activity:  0.248 mCi reference Date: 12/25/2016 The patient tolerated the procedure well and was released from the [facility]. She was given instructions regarding seed removal. IMPRESSION: Radioactive seed localization left breast. No apparent complications. Electronically Signed   By: Claudie Revering M.D.   On: 01/26/2017 14:08   [place] Lt Radioactive Seed Loc  Result Date: 01/26/2017 CLINICAL DATA:  Pre left lumpectomy and lymph node excision localization of a previously biopsied metastatic left axillary lymph node. EXAM: ULTRASOUND GUIDED RADIOACTIVE SEED LOCALIZATION OF THE LEFT AXILLA COMPARISON:  Previous exam(s). FINDINGS: Patient presents for radioactive seed localization prior to left lumpectomy and lymph node excision. I met with the patient and we discussed the procedure of seed localization including benefits and alternatives. We discussed the high likelihood of a successful procedure. We discussed the risks of the procedure including infection, bleeding, tissue injury and further surgery. We discussed the low dose of radioactivity involved in the procedure. Informed, written consent was given. The usual time-out protocol was performed immediately prior to the procedure. Using ultrasound guidance, sterile technique, 1% lidocaine and an I-125 radioactive seed, the previously placed spiral shaped HydroMARK biopsy marker clip within a previously biopsied metastatic left axillary lymph node was localized using a caudal approach. The follow-up mammogram images confirm the seed in the expected location and were marked for Dr. Dalbert Batman. Follow-up survey of the patient confirms presence of the radioactive seed. Order number of I-125  seed:  [id number]. Total activity:  0.253 mCi reference Date: 01/06/2017 The patient tolerated the procedure well and was released from the [facility]. She was given instructions regarding seed removal. IMPRESSION: Radioactive seed localization of a previously biopsied metastatic left axillary lymph node. No apparent complications. Electronically Signed   By: Claudie Revering M.D.   On: 01/26/2017 14:07   Mm Clip Placement Left  Result Date: 01/27/2017 CLINICAL DATA:  Status post ultrasound guided seed localization of a spiral shaped biopsy marker clip within a previously biopsied metastatic left axillary lymph node. The patient also had a mammographically guided seed localization of a ribbon shaped biopsy marker clip in the 2 o'clock position of the left breast at the location of a previously biopsied invasive ductal carcinoma. EXAM: DIAGNOSTIC LEFT MAMMOGRAM POST ULTRASOUND-GUIDED RADIOACTIVE SEED PLACEMENT COMPARISON:  Previous exam(s). FINDINGS: Mammographic images were obtained following ultrasound-guided radioactive seed placement. These demonstrate a radioactive seed immediately adjacent to the previously placed spiral shaped biopsy marker clip within a left axillary lymph node. There is also a seed immediately adjacent to the previously placed ribbon shaped biopsy marker clip in the 2 o'clock position of the left breast. IMPRESSION: Appropriate location of the radioactive seeds. Final Assessment: Post Procedure Mammograms for Seed Placement Electronically Signed   By: Claudie Revering M.D.   On: 01/27/2017 08:14    ELIGIBLE FOR AVAILABLE RESEARCH PROTOCOL: no   ASSESSMENT: 47 y.o. 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 Additional testing of the patient's mother and maternal aunt is pending. Otherwise [facility]'s Common Hereditary Cancers Panel found no deleterious mutations in APC, ATM, AXIN2, BARD1, BMPR1A, BRCA1, BRCA2, BRIP1, CDH1, CDKN2A, CHEK2, CTNNA1, DICER1, EPCAM, GREM1, HOXB13, KIT, MEN1, MLH1, MSH2, MSH3, MSH6, MUTYH, NBN, NF1, NTHL1, PALB2, PDGFRA, PMS2, POLD1, POLE, PTEN, RAD50, RAD51C, RAD51D, SDHA, SDHB, SDHC, SDHD, SMAD4, SMARCA4, STK11, TP53, TSC1, TSC2, and VHL.  (2) neoadjuvant chemotherapy to consist of doxorubicin and cyclophosphamide in dose dense fashion 4 completed 09/10/2016 to be followed by paclitaxel weekly 12 given with carboplatin   (a) cycle 4 of cyclophosphamide and doxorubicin was delayed 10 days and dose decreased 10% because of febrile neutropenia after cycle 3  (b) cycle 6 of Paclitaxel and Carboplatin delayed due to neutropenia, therefore Granix added to Wednesday, Thursday, Friday following chemotherapy days  (3) status post left lumpectomy and left axillary sentinel lymph node sampling 01/27/2017 with pathology showing a complete pathologic response (ypT0 ypN0); 5 left axillary lymph nodes removed  (4) adjuvant radiation pending  PLAN: [name] at the best possible response to her neoadjuvant chemotherapy.  In cases like hers, a complete pathologic response does predict a good long-term prognosis.  She understands she had 5 lymph nodes removed from the armpit and that this can compromise the drainage from the left arm.  She will be using her right arm primarily for blood draws, blood pressure testing, etc.  She is having some discomfort in the left axilla.  She may benefit from Neurontin at bedtime for this.  We discussed that and I went ahead and wrote her the prescription.  I did not encourage her to use it during the day as it may make her sleepy.  Is now ready  to get back into work, which is demanding.  However she also needs to start a walking or similar program.  She understands she will eventually develop some fatigue from radiation and that exercise is the best way to shake that off.  She is going to return to see me in approximately 3 months.  We will initiate long-term follow-up at that point.  She knows to call for any other issues that may develop before the next visit.   Sena Hoopingarner, [name], MD  02/10/17 8:32 AM Medical Oncology and Hematology [facility] [address] [place], [place] [address] Tel. [phone]    Fax. [phone]  This document serves as a record of services personally performed by Lurline Del, MD. It was created on his behalf by Sheron Nightingale, a trained medical scribe. The creation of this record is based on the scribe's personal observations and the provider's statements to them.   I have reviewed the above documentation for accuracy and completeness, and I agree with the above.

## 2017-02-10 ENCOUNTER — Ambulatory Visit
Admission: RE | Admit: 2017-02-10 | Discharge: 2017-02-10 | Disposition: A | Discharge status: Home / Self Care | Payer: Medicaid Other | Source: Ambulatory Visit | Attending: Radiation Oncology | Admitting: Radiation Oncology

## 2017-02-10 ENCOUNTER — Inpatient Hospital Stay (HOSPITAL_BASED_OUTPATIENT_CLINIC_OR_DEPARTMENT_OTHER): Payer: Medicaid Other | Admitting: Oncology

## 2017-02-10 ENCOUNTER — Encounter: Payer: Self-pay | Admitting: Radiation Oncology

## 2017-02-10 VITALS — BP 110/75 | HR 85 | Temp 98.6°F | Resp 18 | Ht 67.0 in | Wt 257.8 lb

## 2017-02-10 DIAGNOSIS — Z6841 Body Mass Index (BMI) 40.0 and over, adult: Secondary | ICD-10-CM | POA: Diagnosis not present

## 2017-02-10 DIAGNOSIS — C50412 Malignant neoplasm of upper-outer quadrant of left female breast: Secondary | ICD-10-CM | POA: Diagnosis not present

## 2017-02-10 DIAGNOSIS — Z171 Estrogen receptor negative status [ER-]: Secondary | ICD-10-CM | POA: Insufficient documentation

## 2017-02-10 DIAGNOSIS — Z9221 Personal history of antineoplastic chemotherapy: Secondary | ICD-10-CM | POA: Insufficient documentation

## 2017-02-10 DIAGNOSIS — Z888 Allergy status to other drugs, medicaments and biological substances status: Secondary | ICD-10-CM

## 2017-02-10 DIAGNOSIS — G588 Other specified mononeuropathies: Secondary | ICD-10-CM | POA: Insufficient documentation

## 2017-02-10 DIAGNOSIS — C773 Secondary and unspecified malignant neoplasm of axilla and upper limb lymph nodes: Secondary | ICD-10-CM | POA: Insufficient documentation

## 2017-02-10 DIAGNOSIS — Z79899 Other long term (current) drug therapy: Secondary | ICD-10-CM | POA: Diagnosis not present

## 2017-02-10 DIAGNOSIS — Z9851 Tubal ligation status: Secondary | ICD-10-CM | POA: Insufficient documentation

## 2017-02-10 MED ORDER — GABAPENTIN 300 MG PO CAPS: 300.0000 mg | ORAL_CAPSULE | Freq: Every day | ORAL | 4 refills | Status: DC

## 2017-02-10 MED FILL — GABAPENTIN 300 MG CAPSULE: 300 | 30 days supply | Qty: 30 | Fill #0

## 2017-02-10 NOTE — Progress Notes (Signed)
Radiation Oncology         [phone] ________________________________  Name: [name] MRN: [id number]  Date: 02/10/2017  DOB: 1970-03-15  Follow-Up Visit Note  Outpatient  CC: [name], MD  [name], MD  Diagnosis:      ICD-10-CM   1. Malignant neoplasm of upper-outer quadrant of left breast in female, estrogen receptor negative ([place]) C50.412    Z17.1    Cancer Staging Malignant neoplasm of upper-outer quadrant of left breast in female, estrogen receptor negative ([place]) Staging form: Breast, AJCC 8th Edition - Clinical stage from 07/01/2016: Stage IIIB (cT2, cN1(f), cM0, G3, ER: Negative, PR: Negative, HER2: Negative) - Unsigned    Clinical Stage T2-T3 N1 M0, Pathologic Stage ypT0 ypN0 Left Breast UOQ Invasive Ductal Carcinoma, ER Negative / PR Negative / Her2 Negative, Grade III  CHIEF COMPLAINT: Here to discuss management of left breast cancer  Narrative:  The patient returns today for follow-up.   To review, right before consultation, she underwent multiple staging scans including an MRI of the breasts on 07/04/2016 that showed the biopsy-proven carcinoma within the UOQ of the left breast measuring 2.5 cm, as well as a 10 x 7 cm non-mass enhancement within the outer left breast. There were three enlarged/abnormal lymph nodes in the left axilla, 1 of which corresponded to the biopsy-proven lymph node metastasis. There was no evidence of malignancy in the right breast and no additional lymphadenopathy in the right axilla or internal mammary chain regions. A CT of the chest performed on 07/06/2016 showed the known left breast mass with left axillary adenopathy and no additional evidence of metastatic disease. Her bone scan on 07/06/2016 showed no findings felt to be indicative of bony metastatic disease.  She received neoadjuvant chemotherapy under the care of [name] with doxorubicin and cyclophosphamide x4, completed 09/10/2016, followed by paclitaxel  weekly x12 given with carboplatin, completed 01/07/2017. She then underwent left breast lumpectomy with left axillary deep lymph node biopsy on 01/27/2017. Pathology revealed fibrosis with slight chronic inflammation and no residual carcinoma with clear margins, showing a complete pathologic response to therapy. All 5 left axillary lymph nodes were also negative.  The patient has kindly been referred back today for discussion of adjuvant radiation treatment. She is accompanied by her husband. On review of systems, she reports nerve pain underneath her left arm. She was started on gabapentin today by [name]. She denies any lymphedema issues and has good arm mobility.           ALLERGIES:  is allergic to carboplatin.  Meds: Current Outpatient Medications  Medication Sig Dispense Refill  . docusate sodium (COLACE) 100 MG capsule Take 100 mg by mouth 2 (two) times daily.    Marland Kitchen gabapentin (NEURONTIN) 300 MG capsule Take 1 capsule (300 mg total) by mouth at bedtime. 90 capsule 4  . ibuprofen (ADVIL,MOTRIN) 200 MG tablet Take 1-2 tablets (200-400 mg total) by mouth every 8 (eight) hours as needed.     No current facility-administered medications for this encounter.     Physical Findings: Vital signs from Dr. [name] doctor visit this am: Temp 98.6, BP 110/75, pulse 85, resp 18, 97% Room air O2 sat.   General: Alert and oriented, in no acute distress. HEENT: Head is normocephalic. Neck: Neck is supple, no palpable cervical or supraclavicular lymphadenopathy. Heart: Regular in rate and rhythm with no murmurs, rubs, or gallops. Vascular: She has a healing scar over the port-a-cath site where it was removed from  her upper chest. Chest: Clear to auscultation bilaterally, with no rhonchi, wheezes, or rales. Abdomen: Soft, nontender, nondistended, with no rigidity or guarding. Extremities: No cyanosis or edema. Lymphatics: see Neck Exam Musculoskeletal: Good ROM in her right shoulder.    Neurologic: No obvious focalities. Speech is fluent.  Psychiatric: Judgment and insight are intact. Affect is appropriate. Breast exam reveals no palpable masses in the right breast. She has a little bit of erythema in the the lateral lumpectomy scar and the left axillary scar but does not appear to be infected, and there is no significant warmth.  Lab Findings: Lab Results  Component Value Date   WBC 5.2 01/25/2017   HGB 13.1 01/25/2017   HCT 39.3 01/25/2017   MCV 94.5 01/25/2017   PLT 181 01/25/2017       Radiographic Findings: Nm Sentinel Node Inj-no Rpt (breast)  Result Date: 01/27/2017 Sulfur colloid was injected by the nuclear medicine technologist for melanoma sentinel node.   Mm Breast Surgical Specimen  Result Date: 01/27/2017 CLINICAL DATA:  Post left axillary lymph node dissection. EXAM: SPECIMEN RADIOGRAPH OF THE LEFT AXILLA COMPARISON:  Previous exam(s). FINDINGS: Status post excision of the left breast. The radioactive seed and spiral shaped biopsy marker clip are present, and completely intact. IMPRESSION: Specimen radiograph of the left axilla. Electronically Signed   By: Fidela Salisbury M.D.   On: 01/27/2017 12:25   Mm Breast Surgical Specimen  Result Date: 01/27/2017 CLINICAL DATA:  Evaluate surgical specimen following excision of left breast invasive ductal carcinoma. EXAM: SPECIMEN RADIOGRAPH OF THE LEFT BREAST COMPARISON:  Previous exam(s). FINDINGS: Status post excision of the left breast. The radioactive seed and biopsy marker clip are present, completely intact, and were marked for pathology. IMPRESSION: Specimen radiograph of the left breast. Electronically Signed   By: Margarette Canada M.D.   On: 01/27/2017 11:39   Mm Lt Radioactive Seed Loc Mammo Guide  Result Date: 01/26/2017 CLINICAL DATA:  Pre lumpectomy localization of invasive ductal carcinoma in the 2 o'clock position of the left breast treated with neoadjuvant chemotherapy. EXAM: MAMMOGRAPHIC GUIDED  RADIOACTIVE SEED LOCALIZATION OF THE LEFT BREAST COMPARISON:  Previous exam(s). FINDINGS: Patient presents for radioactive seed localization prior to left lumpectomy. I met with the patient and we discussed the procedure of seed localization including benefits and alternatives. We discussed the high likelihood of a successful procedure. We discussed the risks of the procedure including infection, bleeding, tissue injury and further surgery. We discussed the low dose of radioactivity involved in the procedure. Informed, written consent was given. The usual time-out protocol was performed immediately prior to the procedure. Using mammographic guidance, sterile technique, 1% lidocaine and an I-125 radioactive seed, the previously placed ribbon shaped biopsy marker clip in the 2 o'clock position of the left breast was localized using a lateral approach. The follow-up mammogram images confirm the seed in the expected location and were marked for Dr. Dalbert Batman. Follow-up survey of the patient confirms presence of the radioactive seed. Order number of I-125 seed:  [id number]. Total activity:  0.248 mCi reference Date: 12/25/2016 The patient tolerated the procedure well and was released from the [facility]. She was given instructions regarding seed removal. IMPRESSION: Radioactive seed localization left breast. No apparent complications. Electronically Signed   By: Claudie Revering M.D.   On: 01/26/2017 14:08   [place] Lt Radioactive Seed Loc  Result Date: 01/26/2017 CLINICAL DATA:  Pre left lumpectomy and lymph node excision localization of a previously biopsied metastatic left axillary lymph node.  EXAM: ULTRASOUND GUIDED RADIOACTIVE SEED LOCALIZATION OF THE LEFT AXILLA COMPARISON:  Previous exam(s). FINDINGS: Patient presents for radioactive seed localization prior to left lumpectomy and lymph node excision. I met with the patient and we discussed the procedure of seed localization including benefits and alternatives. We  discussed the high likelihood of a successful procedure. We discussed the risks of the procedure including infection, bleeding, tissue injury and further surgery. We discussed the low dose of radioactivity involved in the procedure. Informed, written consent was given. The usual time-out protocol was performed immediately prior to the procedure. Using ultrasound guidance, sterile technique, 1% lidocaine and an I-125 radioactive seed, the previously placed spiral shaped HydroMARK biopsy marker clip within a previously biopsied metastatic left axillary lymph node was localized using a caudal approach. The follow-up mammogram images confirm the seed in the expected location and were marked for Dr. Dalbert Batman. Follow-up survey of the patient confirms presence of the radioactive seed. Order number of I-125 seed:  [id number]. Total activity:  0.253 mCi reference Date: 01/06/2017 The patient tolerated the procedure well and was released from the [facility]. She was given instructions regarding seed removal. IMPRESSION: Radioactive seed localization of a previously biopsied metastatic left axillary lymph node. No apparent complications. Electronically Signed   By: Claudie Revering M.D.   On: 01/26/2017 14:07   Mm Clip Placement Left  Result Date: 01/27/2017 CLINICAL DATA:  Status post ultrasound guided seed localization of a spiral shaped biopsy marker clip within a previously biopsied metastatic left axillary lymph node. The patient also had a mammographically guided seed localization of a ribbon shaped biopsy marker clip in the 2 o'clock position of the left breast at the location of a previously biopsied invasive ductal carcinoma. EXAM: DIAGNOSTIC LEFT MAMMOGRAM POST ULTRASOUND-GUIDED RADIOACTIVE SEED PLACEMENT COMPARISON:  Previous exam(s). FINDINGS: Mammographic images were obtained following ultrasound-guided radioactive seed placement. These demonstrate a radioactive seed immediately adjacent to the previously placed  spiral shaped biopsy marker clip within a left axillary lymph node. There is also a seed immediately adjacent to the previously placed ribbon shaped biopsy marker clip in the 2 o'clock position of the left breast. IMPRESSION: Appropriate location of the radioactive seeds. Final Assessment: Post Procedure Mammograms for Seed Placement Electronically Signed   By: Claudie Revering M.D.   On: 01/27/2017 08:14    Impression/Plan:  Clinical Stage T2-T3 N1 Mx, Pathologic Stage ypT0 ypN0 Left Breast UOQ Invasive Ductal Carcinoma, ER Negative / PR Negative / Her2 Negative, Grade III  We discussed adjuvant radiotherapy today.  I recommend radiotherapy to the left breast and SCV/PAB nodes in order to reduce risk of locoregional recurrence.  The risks, benefits and side effects of this treatment were discussed in detail.  She understands that radiotherapy is associated with skin irritation and fatigue in the acute setting. Late effects can include cosmetic changes and rare injury to internal organs.   She is enthusiastic about proceeding with treatment. A consent form has been signed and placed in her chart.  The patient has had a tubal ligation, and she is not current menstruating. Her pregnancy test two weeks ago was negative.  A total of 5 medically necessary complex treatment devices will be fabricated and supervised by me: 4 fields with MLCs for custom blocks to protect heart, and lungs;  and, a Vac-lok. MORE COMPLEX DEVICES MAY BE MADE IN DOSIMETRY FOR FIELD IN FIELD BEAMS FOR DOSE HOMOGENEITY.  I have requested : 3D Simulation which is medically necessary to give adequate dose  to at risk tissues while sparing lungs and heart.  I have requested a DVH of the following structures: lungs, heart, left lumpectomy cavity.    The patient will receive 50 Gy in 25 fractions to the left breast and regional nodes  with 4 fields.  This will be followed by a boost. Simulation in 2 wks.  I spent at least 25 minutes face to  face with the patient and more than 50% of that time was spent in counseling and/or coordination of care. _____________________________________   [name], MD  This document serves as a record of services personally performed by [name], MD. It was created on her behalf by Rae Lips, a trained medical scribe. The creation of this record is based on the scribe's personal observations and the provider's statements to them. This document has been checked and approved by the attending provider.

## 2017-02-19 ENCOUNTER — Other Ambulatory Visit: Payer: Self-pay

## 2017-02-19 ENCOUNTER — Telehealth: Payer: Self-pay

## 2017-02-19 DIAGNOSIS — C50412 Malignant neoplasm of upper-outer quadrant of left female breast: Secondary | ICD-10-CM

## 2017-02-19 DIAGNOSIS — Z171 Estrogen receptor negative status [ER-]: Principal | ICD-10-CM

## 2017-02-19 MED ORDER — GABAPENTIN 100 MG PO CAPS: 200.0000 mg | ORAL_CAPSULE | Freq: Every day | ORAL | 1 refills | Status: DC

## 2017-02-19 MED FILL — GABAPENTIN 100 MG CAPS: 100 | 30 days supply | Qty: 60 | Fill #0

## 2017-02-19 NOTE — Telephone Encounter (Signed)
Returned pt's call regarding her VM about the Neurontin 300 mg is helping at night with burning under left arm but she is returning to work needs something to take during the day for this discomfort.  She reports the hydrocodone does not help so she doesn't take it.  Verbal order for Neurontin 200 mg during the day and explained to pt she can take 100-200 mg during day and continue her 300 mg at bedtime. Pt voiced understanding.

## 2017-02-24 ENCOUNTER — Ambulatory Visit: Payer: Medicaid Other | Admitting: Radiation Oncology

## 2017-02-26 ENCOUNTER — Encounter (HOSPITAL_BASED_OUTPATIENT_CLINIC_OR_DEPARTMENT_OTHER): Payer: Self-pay | Admitting: *Deleted

## 2017-02-26 ENCOUNTER — Other Ambulatory Visit: Payer: Self-pay

## 2017-02-26 ENCOUNTER — Emergency Department (HOSPITAL_BASED_OUTPATIENT_CLINIC_OR_DEPARTMENT_OTHER)
Admission: EM | Admit: 2017-02-26 | Discharge: 2017-02-26 | Disposition: A | Discharge status: Home / Self Care | Payer: Medicaid Other | Attending: Emergency Medicine | Admitting: Emergency Medicine

## 2017-02-26 ENCOUNTER — Emergency Department (HOSPITAL_BASED_OUTPATIENT_CLINIC_OR_DEPARTMENT_OTHER): Payer: Medicaid Other

## 2017-02-26 DIAGNOSIS — Z79899 Other long term (current) drug therapy: Secondary | ICD-10-CM | POA: Diagnosis not present

## 2017-02-26 DIAGNOSIS — M79602 Pain in left arm: Secondary | ICD-10-CM | POA: Diagnosis not present

## 2017-02-26 DIAGNOSIS — I10 Essential (primary) hypertension: Secondary | ICD-10-CM | POA: Diagnosis not present

## 2017-02-26 MED ORDER — METHOCARBAMOL 500 MG PO TABS
1000.0000 mg | ORAL_TABLET | Freq: Once | ORAL | Status: AC
  Administered 2017-02-26: 1000 mg via ORAL
  Filled 2017-02-26: qty 2

## 2017-02-26 MED ORDER — METHOCARBAMOL 500 MG PO TABS: 1000.0000 mg | ORAL_TABLET | Freq: Four times a day (QID) | ORAL | 0 refills | Status: DC

## 2017-02-26 NOTE — ED Triage Notes (Signed)
Pain in her left shoulder since yesterday. She called her MD and made an appointment for Monday. The pain has continued. She has limited ROM. Hx of lumpectomy and lymph node removal x 4 in January. She has been on Neurontin for the past 2 weeks. She is suppose to start radiation on Wednesday but can't if she can't raise her arm.

## 2017-02-26 NOTE — ED Provider Notes (Signed)
[facility] EMERGENCY DEPARTMENT Provider Note   CSN: [id number] Arrival date & time: 02/26/17  1831     History   Chief Complaint Chief Complaint  Patient presents with  . Arm Pain    HPI [name] is a 47 y.o. female.  Patient with history of breast cancer status post surgery and removal of lymph nodes presents with complaint of acute onset, sharp searing pain to the posterior left shoulder radiating down the lateral aspect of her upper left arm.  Patient has had some milder pain in the axilla area at the site of the surgery for which she was prescribed gabapentin which helped.  No acute injury today.  No numbness, tingling, or weakness in the arm or hand.  No other treatments.  She is concerned because she is due to start radiation next week and is having difficulty raising her arm above shoulder level.  She denies any neck pain.  No fevers, nausea or vomiting.  No chest pain or shortness of breath.      Past Medical History:  Diagnosis Date  . Breast cancer ([place])    lf breast  . Complication of anesthesia    DAY SURGERY 2010 OR 2011 ASPIRIATED AND STAYED OVERNIGHT  . Hypertension    OFF MEDS  SINCE CHEMO X 5 MONTHS  . Malignant neoplasm of upper-outer quadrant of left breast in female, estrogen receptor negative ([place]) 06/23/2016    Patient Active Problem List   Diagnosis Date Noted  . Port-A-Cath in place 11/02/2016  . Cellulitis 10/24/2016  . Family history of breast cancer 10/08/2016  . Genetic testing 09/21/2016  . Sepsis ([place]) 08/27/2016  . Rash 08/27/2016  . Hypokalemia 08/27/2016  . Neutropenic fever ([place]) 08/26/2016  . Constipation 07/27/2016  . Malignant neoplasm of upper-outer quadrant of left breast in female, estrogen receptor negative ([place]) 06/23/2016  . Left breast mass 06/12/2016  . Essential hypertension 06/12/2016  . Class 3 severe obesity due to excess calories without serious comorbidity with body mass index (BMI) of 40.0 to 44.9  in adult ([place]) 06/12/2016  . Low vitamin D level 06/12/2016  . Irregular menses 06/12/2016    Past Surgical History:  Procedure Laterality Date  . ANKLE SURGERY    . BREAST LUMPECTOMY WITH RADIOACTIVE SEED AND SENTINEL LYMPH NODE BIOPSY Left 01/27/2017   Procedure: LEFT BREAST LUMPECTOMY WITH RADIOACTIVE SEED LOCALIZATION, LEFT AXILLARY DEEP LYMPH NODE BIOPSY WITH RADIOACTIVE SEED LOCALIZATION, LEFT AXILLARY SENTINEL LYMPH NODE MAPPING AND BIOPSY WITH BLUE DYE INJECTION;  Surgeon: [name], MD;  Location: [facility];  Service: General;  Laterality: Left;  . IR FLUORO GUIDE PORT INSERTION RIGHT  07/14/2016  . IR US GUIDE VASC ACCESS RIGHT  07/14/2016  . PORT-A-CATH REMOVAL Right 01/27/2017   Procedure: REMOVAL PORT-A-CATH;  Surgeon: [name], MD;  Location: [facility];  Service: General;  Laterality: Right;  . TUBAL LIGATION    . WISDOM TOOTH EXTRACTION      OB History    Gravida Para Term Preterm AB Living   6       2 4    SAB TAB Ectopic Multiple Live Births   2       4       Home Medications    Prior to Admission medications   Medication Sig Start Date End Date Taking? Authorizing Provider  docusate sodium (COLACE) 100 MG capsule Take 100 mg by mouth 2 (two) times daily.    [provider]  gabapentin (NEURONTIN) 100  MG capsule Take 2 capsules (200 mg total) by mouth daily. 02/19/17   Magrinat, [name], MD  gabapentin (NEURONTIN) 300 MG capsule Take 1 capsule (300 mg total) by mouth at bedtime. 02/10/17   Magrinat, [name], MD  ibuprofen (ADVIL,MOTRIN) 200 MG tablet Take 1-2 tablets (200-400 mg total) by mouth every 8 (eight) hours as needed. 02/10/17   Magrinat, [name], MD  methocarbamol (ROBAXIN) 500 MG tablet Take 2 tablets (1,000 mg total) by mouth 4 (four) times daily. 02/26/17   Carlisle Cater, PA-C    Family History Family History  Problem Relation Age of Onset  . Diabetes Mother   . Hypertension Mother   . Arthritis Mother   . Colon cancer Father 75        d.76 metastatic at time of diagnosis  . Diabetes Father   . Heart disease Father   . Hypertension Father   . Breast cancer Maternal Grandmother 87       d.60s  . Breast cancer Maternal Aunt 52  . Cervical cancer Maternal Aunt 66  . Breast cancer Maternal Aunt 46       d.50s  . Cancer Maternal Uncle        d.62s unspecified type of cancer  . Breast cancer Cousin 65       paternal first-cousin (daughter of unaffected aunt)  . Cancer Maternal Aunt 50       "Female Cancer"  . Cancer Maternal Aunt        unknown cancer    Social History Social History   Tobacco Use  . Smoking status: Never Smoker  . Smokeless tobacco: Never Used  Substance Use Topics  . Alcohol use: No  . Drug use: No     Allergies   Carboplatin   Review of Systems Review of Systems  Constitutional: Negative for activity change.  Musculoskeletal: Positive for myalgias. Negative for arthralgias, back pain, joint swelling and neck pain.  Skin: Negative for wound.  Neurological: Negative for weakness and numbness.     Physical Exam Updated Vital Signs BP 126/87 (BP Location: Right Arm)   Pulse 75   Temp 98.5 F (36.9 C) (Oral)   Resp 18   Ht 5\' 7"  (1.702 m)   Wt 116.1 kg (256 lb)   SpO2 100%   BMI 40.10 kg/m   Physical Exam  Constitutional: She appears well-developed and well-nourished.  HENT:  Head: Normocephalic and atraumatic.  Eyes: Pupils are equal, round, and reactive to light.  Neck: Normal range of motion. Neck supple.  Cardiovascular: Regular rhythm and normal pulses. Exam reveals no decreased pulses.  No murmur heard. Pulses:      Radial pulses are 2+ on the right side, and 2+ on the left side.  Pulmonary/Chest: Effort normal and breath sounds normal. No respiratory distress. She has no wheezes. She has no rales.  Musculoskeletal: She exhibits tenderness. She exhibits no edema.       Left shoulder: She exhibits decreased range of motion and tenderness. She exhibits no bony  tenderness.       Left elbow: Normal.       Left wrist: Normal.       Cervical back: She exhibits normal range of motion, no tenderness and no bony tenderness.       Left upper arm: She exhibits tenderness. She exhibits no bony tenderness and no swelling.       Left forearm: Normal.       Arms:  Left hand: Normal.  Neurological: She is alert. No sensory deficit.  Motor, sensation, and vascular distal to the injury is fully intact.   Skin: Skin is warm and dry.  Psychiatric: She has a normal mood and affect.  Nursing note and vitals reviewed.    ED Treatments / Results  Labs (all labs ordered are listed, but only abnormal results are displayed) Labs Reviewed - No data to display  EKG  EKG Interpretation None       Radiology Dg Shoulder Left  Result Date: 02/26/2017 CLINICAL DATA:  Lt lumpectomy w/lymph node removal in January. States yesterday she developed severe posterior Lt shoulder pain radiating down the posterolateral area of her Lt humerus w/no injury. Best images possible obtained. Patient is starting radiating next week and has decreased ROM in Lt arm today EXAM: LEFT SHOULDER - 2+ VIEW COMPARISON:  None. FINDINGS: There is no evidence of fracture or dislocation. There is no evidence of arthropathy or other focal bone abnormality. Soft tissues are unremarkable. IMPRESSION: Negative. Electronically Signed   By: Lucrezia Europe M.D.   On: 02/26/2017 20:16   Dg Humerus Left  Result Date: 02/26/2017 CLINICAL DATA:  Lt lumpectomy w/lymph node removal in January. States yesterday she developed severe posterior Lt shoulder pain radiating down the posterolateral area of her Lt humerus w/no injury. Best images possible obtained. Patient is starting radiating next week and has decreased ROM in Lt arm today EXAM: LEFT HUMERUS - 2+ VIEW COMPARISON:  None. FINDINGS: There is no evidence of fracture or other focal bone lesions. Soft tissues are unremarkable. Surgical clips in the left  breast. IMPRESSION: Negative. Electronically Signed   By: Lucrezia Europe M.D.   On: 02/26/2017 20:16    Procedures Procedures (including critical care time)  Medications Ordered in ED Medications  methocarbamol (ROBAXIN) tablet 1,000 mg (not administered)     Initial Impression / Assessment and Plan / ED Course  I have reviewed the triage vital signs and the nursing notes.  Pertinent labs & imaging results that were available during my care of the patient were reviewed by me and considered in my medical decision making (see chart for details).     Patient seen and examined.  Pain seems neuropathic in nature.  X-rays ordered.  Patient does not have any clinical signs and symptoms of DVT or arterial occlusion.  Distal arm and hand with normal sensation and strength.  Vital signs reviewed and are as follows: BP 126/87 (BP Location: Right Arm)   Pulse 75   Temp 98.5 F (36.9 C) (Oral)   Resp 18   Ht 5\' 7"  (1.702 m)   Wt 116.1 kg (256 lb)   SpO2 100%   BMI 40.10 kg/m   9:11 PM x-rays were negative.  Discussed possible treatment modalities including pain medications, NSAIDs, muscle relaxants.  Offered sling, patient declines because she does want the shoulder to become more stiff.  Discussed topical medications as well as heat and ice.  She will use these at home.  Dose of Robaxin given prior to discharge.  Encouraged follow-up with her doctors for further evaluation if not improving.  Final Clinical Impressions(s) / ED Diagnoses   Final diagnoses:  Musculoskeletal pain of left upper extremity   Patient with left shoulder and upper extremity pain suggestive of neuropathic pain.  Also muscle spasm may be related.  X-rays are negative without any concerning bony findings.  Normal radial pulses without signs of ischemia.  No clinical signs of upper  extremity DVT.  Will treat conservatively and have patient follow-up for further evaluation.  Patient does not have any neck tenderness or  pain at the current time.  ED Discharge Orders        Ordered    methocarbamol (ROBAXIN) 500 MG tablet  4 times daily     02/26/17 2055       Carlisle Cater, Hershal Coria 02/26/17 2113    Margette Fast, MD 02/27/17 1114

## 2017-02-26 NOTE — ED Notes (Signed)
Left arm pain   Decreased rom  States chemo tx next week

## 2017-02-26 NOTE — Discharge Instructions (Addendum)
Please read and follow all provided instructions.  Your diagnoses today include:  1. Musculoskeletal pain of left upper extremity     Tests performed today include: An x-ray of the affected area - does NOT show any broken bones Vital signs. See below for your results today.   Medications prescribed:  Robaxin (methocarbamol) - muscle relaxer medication  DO NOT drive or perform any activities that require you to be awake and alert because this medicine can make you drowsy.   Take any prescribed medications only as directed.  Home care instructions:  Follow any educational materials contained in this packet Follow R.I.C.E. Protocol: R - rest your injury  I  - use ice on injury without applying directly to skin C - compress injury with bandage or splint E - elevate the injury as much as possible  Follow-up instructions: Please follow-up with your primary care provider if you continue to have significant pain in 1 week.   Return instructions:  Please return if your fingers are numb or tingling, appear gray or blue, or you have severe pain (also elevate the arm and loosen splint or wrap if you were given one) Please return to the Emergency Department if you experience worsening symptoms.  Please return if you have any other emergent concerns.  Additional Information:  Your vital signs today were: BP 126/87 (BP Location: Right Arm)    Pulse 75    Temp 98.5 F (36.9 C) (Oral)    Resp 18    Ht 5\' 7"  (1.702 m)    Wt 116.1 kg (256 lb)    SpO2 100%    BMI 40.10 kg/m  If your blood pressure (BP) was elevated above 135/85 this visit, please have this repeated by your doctor within one month. --------------

## 2017-02-27 ENCOUNTER — Encounter (HOSPITAL_BASED_OUTPATIENT_CLINIC_OR_DEPARTMENT_OTHER): Payer: Self-pay | Admitting: *Deleted

## 2017-02-27 ENCOUNTER — Other Ambulatory Visit: Payer: Self-pay

## 2017-02-27 ENCOUNTER — Emergency Department (HOSPITAL_BASED_OUTPATIENT_CLINIC_OR_DEPARTMENT_OTHER): Payer: Medicaid Other

## 2017-02-27 DIAGNOSIS — Z79899 Other long term (current) drug therapy: Secondary | ICD-10-CM | POA: Diagnosis not present

## 2017-02-27 DIAGNOSIS — M25512 Pain in left shoulder: Secondary | ICD-10-CM | POA: Diagnosis not present

## 2017-02-27 DIAGNOSIS — Z853 Personal history of malignant neoplasm of breast: Secondary | ICD-10-CM | POA: Diagnosis not present

## 2017-02-27 NOTE — ED Triage Notes (Signed)
Pt reports left shoulder pain x 2 days. Was seen yesterday for same. Was treated for same yesterday. Pt has breast cancer and had lumpectomy and node removal in jan. Reports that she cannot raise her arm.

## 2017-02-28 ENCOUNTER — Emergency Department (HOSPITAL_BASED_OUTPATIENT_CLINIC_OR_DEPARTMENT_OTHER)
Admission: EM | Admit: 2017-02-28 | Discharge: 2017-02-28 | Disposition: A | Discharge status: Home / Self Care | Payer: Medicaid Other | Attending: Emergency Medicine | Admitting: Emergency Medicine

## 2017-02-28 DIAGNOSIS — M25512 Pain in left shoulder: Secondary | ICD-10-CM

## 2017-02-28 MED ORDER — OXYCODONE-ACETAMINOPHEN 5-325 MG PO TABS: 1.0000 | ORAL_TABLET | Freq: Four times a day (QID) | ORAL | 0 refills | Status: DC | PRN

## 2017-02-28 MED ORDER — ONDANSETRON 8 MG PO TBDP
8.0000 mg | ORAL_TABLET | Freq: Once | ORAL | Status: AC
Start: 2017-02-28 — End: 2017-02-28
  Administered 2017-02-28: 8 mg via ORAL
  Filled 2017-02-28: qty 1

## 2017-02-28 MED ORDER — ONDANSETRON 8 MG PO TBDP: 8.0000 mg | ORAL_TABLET | Freq: Three times a day (TID) | ORAL | 0 refills | Status: DC | PRN

## 2017-02-28 MED ORDER — HYDROMORPHONE HCL 1 MG/ML IJ SOLN
2.0000 mg | Freq: Once | INTRAMUSCULAR | Status: AC
  Administered 2017-02-28: 2 mg via INTRAMUSCULAR
  Filled 2017-02-28: qty 2

## 2017-02-28 NOTE — ED Provider Notes (Signed)
[facility] Provider Note: Georgena Spurling, MD, FACEP  CSN: [id number] MRN: [id number] ARRIVAL: 02/27/17 at 2039 ROOM: [place]  Shoulder Pain   HISTORY OF PRESENT ILLNESS  02/28/17 1:52 AM [name] is a 47 y.o. female with a history of left breast cancer status post resection and axillary sentinel lymph node biopsy in January of this year following chemotherapy last year.  She is scheduled to start radiation therapy next week.  She is here with pain in her left shoulder and left upper arm for the past 3 days.  She was seen for this 2 days ago and diagnosed with musculoskeletal pain.  Since then the pain has worsened.  Pain is located primarily on the lateral aspect of the left upper arm stopping at about the elbow.  There is no associated redness, warmth or significant swelling.  She has not injured her left shoulder or arm.  She had some postoperative neuropathy treated with gabapentin but this pain is different and much more severe.  She rates it as a 10 out of 10, worse with attempted movement of the left shoulder, especially attempted abduction.  There is no numbness or weakness of the left arm distally.  Her surgical incisions have healed well without signs of infection.  Consultation with the [facility] state controlled substances database reveals the patient has received 2 prescriptions for opioids in the past year.   Past Medical History:  Diagnosis Date  . Breast cancer ([place])    lf breast  . Complication of anesthesia    DAY SURGERY 2010 OR 2011 ASPIRIATED AND STAYED OVERNIGHT  . Hypertension    OFF MEDS  SINCE CHEMO X 5 MONTHS  . Malignant neoplasm of upper-outer quadrant of left breast in female, estrogen receptor negative ([place]) 06/23/2016    Past Surgical History:  Procedure Laterality Date  . ANKLE SURGERY    . BREAST LUMPECTOMY WITH RADIOACTIVE SEED AND SENTINEL LYMPH NODE BIOPSY Left 01/27/2017   Procedure: LEFT BREAST  LUMPECTOMY WITH RADIOACTIVE SEED LOCALIZATION, LEFT AXILLARY DEEP LYMPH NODE BIOPSY WITH RADIOACTIVE SEED LOCALIZATION, LEFT AXILLARY SENTINEL LYMPH NODE MAPPING AND BIOPSY WITH BLUE DYE INJECTION;  Surgeon: [name], MD;  Location: [facility];  Service: General;  Laterality: Left;  . IR FLUORO GUIDE PORT INSERTION RIGHT  07/14/2016  . IR US GUIDE VASC ACCESS RIGHT  07/14/2016  . PORT-A-CATH REMOVAL Right 01/27/2017   Procedure: REMOVAL PORT-A-CATH;  Surgeon: [name], MD;  Location: [facility];  Service: General;  Laterality: Right;  . TUBAL LIGATION    . WISDOM TOOTH EXTRACTION      Family History  Problem Relation Age of Onset  . Diabetes Mother   . Hypertension Mother   . Arthritis Mother   . Colon cancer Father 74       d.76 metastatic at time of diagnosis  . Diabetes Father   . Heart disease Father   . Hypertension Father   . Breast cancer Maternal Grandmother 65       d.60s  . Breast cancer Maternal Aunt 52  . Cervical cancer Maternal Aunt 29  . Breast cancer Maternal Aunt 46       d.50s  . Cancer Maternal Uncle        d.62s unspecified type of cancer  . Breast cancer Cousin 6       paternal first-cousin (daughter of unaffected aunt)  . Cancer Maternal Aunt 33       "Female Cancer"  .  Cancer Maternal Aunt        unknown cancer    Social History   Tobacco Use  . Smoking status: Never Smoker  . Smokeless tobacco: Never Used  Substance Use Topics  . Alcohol use: No  . Drug use: No    Prior to Admission medications   Medication Sig Start Date End Date Taking? Authorizing Provider  docusate sodium (COLACE) 100 MG capsule Take 100 mg by mouth 2 (two) times daily.    [provider]  gabapentin (NEURONTIN) 100 MG capsule Take 2 capsules (200 mg total) by mouth daily. 02/19/17   Magrinat, [name], MD  gabapentin (NEURONTIN) 300 MG capsule Take 1 capsule (300 mg total) by mouth at bedtime. 02/10/17   Magrinat, [name], MD  ibuprofen (ADVIL,MOTRIN) 200 MG  tablet Take 1-2 tablets (200-400 mg total) by mouth every 8 (eight) hours as needed. 02/10/17   Magrinat, [name], MD  methocarbamol (ROBAXIN) 500 MG tablet Take 2 tablets (1,000 mg total) by mouth 4 (four) times daily. 02/26/17   Carlisle Cater, PA-C    Allergies Carboplatin   REVIEW OF SYSTEMS  Negative except as noted here or in the History of Present Illness.   PHYSICAL EXAMINATION  Initial Vital Signs Blood pressure (!) 147/90, pulse 96, temperature 98.8 F (37.1 C), temperature source Oral, resp. rate 20, height 5\' 7"  (1.702 m), weight 117.9 kg (260 lb), SpO2 99 %.  Examination General: Well-developed, well-nourished female in no acute distress; appearance consistent with age of record HENT: normocephalic; atraumatic Eyes: pupils equal, round and reactive to light; extraocular muscles intact Neck: supple Heart: regular rate and rhythm Lungs: clear to auscultation bilaterally Abdomen: soft; nondistended; nontender; bowel sounds present Extremities: No deformity; full range of motion except left shoulder limited by pain especially on abduction; pulses normal; lateral left upper arm tenderness and pain on attempted passive range of motion without edema, erythema or warmth; left upper extremity distally neurovascularly intact Neurologic: Awake, alert and oriented; motor function intact in all extremities and symmetric; no facial droop Skin: Warm and dry Psychiatric: Tearful   RESULTS  Summary of this visit's results, reviewed by myself:   EKG Interpretation  Date/Time:    Ventricular Rate:    PR Interval:    QRS Duration:   QT Interval:    QTC Calculation:   R Axis:     Text Interpretation:        Laboratory Studies: No results found for this or any previous visit (from the past 24 hour(s)). Imaging Studies: US Venous Img Upper Uni Left  Result Date: 02/27/2017 CLINICAL DATA:  47 year old female with left upper extremity swelling and heaviness x2 days. History of  breast cancer. EXAM: Left UPPER EXTREMITY VENOUS DOPPLER ULTRASOUND TECHNIQUE: Gray-scale sonography with graded compression, as well as color Doppler and duplex ultrasound were performed to evaluate the upper extremity deep venous system from the level of the subclavian vein and including the jugular, axillary, basilic, radial, ulnar and upper cephalic vein. Spectral Doppler was utilized to evaluate flow at rest and with distal augmentation maneuvers. COMPARISON:  None. FINDINGS: Contralateral Subclavian Vein: There is linear peripheral nonocclusive echogenic material in the right internal jugular vein most consistent with chronic thrombus/scarring. Internal Jugular Vein: No evidence of thrombus. Normal compressibility, respiratory phasicity and response to augmentation. Subclavian Vein: No evidence of thrombus. Normal compressibility, respiratory phasicity and response to augmentation. Axillary Vein: No evidence of thrombus. Normal compressibility, respiratory phasicity and response to augmentation. Cephalic Vein: No evidence  of thrombus. Normal compressibility, respiratory phasicity and response to augmentation. Basilic Vein: No evidence of thrombus. Normal compressibility, respiratory phasicity and response to augmentation. Brachial Veins: No evidence of thrombus. Normal compressibility, respiratory phasicity and response to augmentation. Radial Veins: No evidence of thrombus. Normal compressibility, respiratory phasicity and response to augmentation. Ulnar Veins: No evidence of thrombus. Normal compressibility, respiratory phasicity and response to augmentation. Venous Reflux:  None visualized. Other Findings:  None visualized. IMPRESSION: No evidence of DVT within the left upper extremity. 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 Clinical correlation is recommended. Electronically Signed   By: Anner Crete M.D.   On: 02/27/2017 22:33   Dg Shoulder  Left  Result Date: 02/26/2017 CLINICAL DATA:  Lt lumpectomy w/lymph node removal in January. States yesterday she developed severe posterior Lt shoulder pain radiating down the posterolateral area of her Lt humerus w/no injury. Best images possible obtained. Patient is starting radiating next week and has decreased ROM in Lt arm today EXAM: LEFT SHOULDER - 2+ VIEW COMPARISON:  None. FINDINGS: There is no evidence of fracture or dislocation. There is no evidence of arthropathy or other focal bone abnormality. Soft tissues are unremarkable. IMPRESSION: Negative. Electronically Signed   By: Lucrezia Europe M.D.   On: 02/26/2017 20:16   Dg Humerus Left  Result Date: 02/26/2017 CLINICAL DATA:  Lt lumpectomy w/lymph node removal in January. States yesterday she developed severe posterior Lt shoulder pain radiating down the posterolateral area of her Lt humerus w/no injury. Best images possible obtained. Patient is starting radiating next week and has decreased ROM in Lt arm today EXAM: LEFT HUMERUS - 2+ VIEW COMPARISON:  None. FINDINGS: There is no evidence of fracture or other focal bone lesions. Soft tissues are unremarkable. Surgical clips in the left breast. IMPRESSION: Negative. Electronically Signed   By: Lucrezia Europe M.D.   On: 02/26/2017 20:16    ED COURSE  Nursing notes and initial vitals signs, including pulse oximetry, reviewed.  Vitals:   02/27/17 2044 02/27/17 2045 02/28/17 0032  BP:  (!) 143/91 (!) 147/90  Pulse:  100 96  Resp:  18 20  Temp:  98.8 F (37.1 C)   TempSrc:  Oral   SpO2:  100% 99%  Weight: 117.9 kg (260 lb)    Height: 5\' 7"  (1.702 m)     The patient advised of reassuring imaging studies.  The exact etiology of her pain is unclear but I suspect it is neuropathic.  She likely will need an MRI.  She has an appointment tomorrow with her surgeons PA and 2 days later with her radiation oncologist.  PROCEDURES    ED DIAGNOSES     ICD-10-CM   1. Acute pain of left shoulder  M25.512        Kyrin Gratz, Jenny Reichmann, MD 02/28/17 [phone]

## 2017-02-28 NOTE — ED Notes (Signed)
Pt given d/c instructions as per chart. Rx x 2 with precautions. Verbalizes understanding. No questions. 

## 2017-03-01 ENCOUNTER — Telehealth: Payer: Self-pay | Admitting: *Deleted

## 2017-03-01 MED ORDER — METHYLPREDNISOLONE 4 MG PO TBPK: ORAL_TABLET | ORAL | 0 refills | Status: DC

## 2017-03-01 MED FILL — LYRICA 25 MG CAPSULE: 25 | 14 days supply | Qty: 28 | Fill #0

## 2017-03-01 MED FILL — METHYLPREDNISOLONE 4 MG TAB: 4 | 6 days supply | Qty: 21 | Fill #0

## 2017-03-01 MED FILL — IBUPROFEN 600 MG TABLET: 600 | 7 days supply | Qty: 21 | Fill #0

## 2017-03-01 NOTE — Telephone Encounter (Signed)
This RN spoke with pt per her concern with ongoing left arm/shoulder pain " more like on the outside then runs inside"-  " I now cannot raise my arm up well at all nor can I grip anything ( from her cell phone, to the door knob )".  She sees a little swelling - no redness.  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 " They told Tommi Rumps to massage my arm but that only made it worse "  She then went to Dr Darrel Hoover this morning and was seen by his PA ( he is out of the office ) - prescription for Lyrica prescribed and was informed to stop the gabapentin.  [place] is calling here " because I am very uncomfortable taking the Lyrica with it's side effects and such and want Dr Gerarda Fraction opinion "  Per MD review of above - recommended for her to take a medrol dose pak- and if not improving will obtain an MRI of area.  Above discussed with [place] - including use of cool or ice for comfort to area on shoulder/under her arm.  Anabelle verbalized understanding and will call with update for possible need for further work up.

## 2017-03-02 ENCOUNTER — Ambulatory Visit: Payer: Medicaid Other | Admitting: Adult Health

## 2017-03-02 ENCOUNTER — Other Ambulatory Visit: Payer: Medicaid Other

## 2017-03-03 ENCOUNTER — Encounter: Payer: Self-pay | Admitting: Radiation Oncology

## 2017-03-03 ENCOUNTER — Ambulatory Visit
Admission: RE | Admit: 2017-03-03 | Discharge: 2017-03-03 | Disposition: A | Discharge status: Home / Self Care | Payer: Medicaid Other | Source: Ambulatory Visit | Attending: Radiation Oncology | Admitting: Radiation Oncology

## 2017-03-03 ENCOUNTER — Telehealth: Payer: Self-pay

## 2017-03-03 DIAGNOSIS — C50412 Malignant neoplasm of upper-outer quadrant of left female breast: Secondary | ICD-10-CM

## 2017-03-03 DIAGNOSIS — Z170 Estrogen receptor positive status [ER+]: Secondary | ICD-10-CM | POA: Insufficient documentation

## 2017-03-03 DIAGNOSIS — Z510 Encounter for antineoplastic radiation therapy: Secondary | ICD-10-CM | POA: Diagnosis present

## 2017-03-03 DIAGNOSIS — Z171 Estrogen receptor negative status [ER-]: Secondary | ICD-10-CM

## 2017-03-03 NOTE — Telephone Encounter (Signed)
Received VM from pt regarding how her left arm/shoulder is doing, she is currently taking Medrol Dosepak.  Returned pt's call and she reports she is doing much better and steroids are helping, swelling decreased. Relayed message to Dr [name].  Reminded pt to call for any further issues and she voiced understanding.

## 2017-03-03 NOTE — Progress Notes (Signed)
Radiation Oncology         [phone] ________________________________  Name: [name] MRN: [id number]  Date: 03/03/2017  DOB: 07-22-1970  SIMULATION AND TREATMENT PLANNING NOTE/   Special treatment procedure   Outpatient  DIAGNOSIS:     ICD-10-CM   1. Malignant neoplasm of upper-outer quadrant of left breast in female, estrogen receptor negative ([place]) C50.412    Z17.1     NARRATIVE:  The patient was brought to the [facility].  Identity was confirmed.  All relevant records and images related to the planned course of therapy were reviewed.  The patient freely provided informed written consent to proceed with treatment after reviewing the details related to the planned course of therapy. The consent form was witnessed and verified by the simulation staff.    Then, the patient was set-up in a stable reproducible supine position for radiation therapy with her ipsilateral arm over her head, and her upper body secured in a custom-made Vac-lok device.  CT images were obtained.  Surface markings were placed.  The CT images were loaded into the planning software.    Special treatment procedure:  Special treatment procedure was performed today due to the extra time and effort required by myself to plan and prepare this patient for deep inspiration breath hold technique.  I have determined cardiac sparing to be of benefit to this patient to prevent long term cardiac damage due to radiation of the heart.  Bellows were placed on the patient's abdomen. To facilitate cardiac sparing, the patient was coached by the radiation therapists on breath hold techniques and breathing practice was performed. Practice waveforms were obtained. The patient was then scanned while maintaining breath hold in the treatment position.  This image was then transferred over to the imaging specialist. The imaging specialist then created a fusion of the free breathing and breath hold scans using the chest  wall as the stable structure. I personally reviewed the fusion in axial, coronal and sagittal image planes.  Excellent cardiac sparing was obtained.  I felt the patient is an appropriate candidate for breath hold and the patient will be treated as such.  The image fusion was then reviewed with the patient to reinforce the necessity of reproducible breath hold.   TREATMENT PLANNING NOTE: Treatment planning then occurred.  The radiation prescription was entered and confirmed.     A total of 5 medically necessary complex treatment devices were fabricated and supervised by me: 4 fields with MLCs for custom blocks to protect heart, and lungs;  and, a Vac-lok. MORE COMPLEX DEVICES MAY BE MADE IN DOSIMETRY FOR FIELD IN FIELD BEAMS FOR DOSE HOMOGENEITY.  I have requested : 3D Simulation which is medically necessary to give adequate dose to at risk tissues while sparing lungs and heart.  I have requested a DVH of the following structures: lungs, heart, lumpectomy cavity (left), cord, esophagus.    The patient will receive 50 Gy in 25 fractions to the left breast and PAB/SCV nodes with 4 fields.   This will be followed by a boost.  Optical Surface Tracking Plan:  Since intensity modulated radiotherapy (IMRT) and 3D conformal radiation treatment methods are predicated on accurate and precise positioning for treatment, intrafraction motion monitoring is medically necessary to ensure accurate and safe treatment delivery. The ability to quantify intrafraction motion without excessive ionizing radiation dose can only be performed with optical surface tracking. Accordingly, surface imaging offers the opportunity to obtain 3D measurements of patient position throughout  IMRT and 3D treatments without excessive radiation exposure. I am ordering optical surface tracking for this patient's upcoming course of radiotherapy.  ________________________________   Reference:  Ursula Alert, J, et al. Surface  imaging-based analysis of intrafraction motion for breast radiotherapy patients.Journal of [place], n. 6, nov. 2014. ISSN [id number].  Available at: <http://www.jacmp.org/index.php/jacmp/article/view/4957>.    -----------------------------------  [name], MD

## 2017-03-04 ENCOUNTER — Telehealth: Payer: Self-pay | Admitting: Physical Therapy

## 2017-03-04 ENCOUNTER — Ambulatory Visit: Payer: Medicaid Other | Attending: General Surgery | Admitting: Physical Therapy

## 2017-03-05 DIAGNOSIS — Z510 Encounter for antineoplastic radiation therapy: Secondary | ICD-10-CM | POA: Insufficient documentation

## 2017-03-05 DIAGNOSIS — Z171 Estrogen receptor negative status [ER-]: Secondary | ICD-10-CM | POA: Diagnosis not present

## 2017-03-05 DIAGNOSIS — C50412 Malignant neoplasm of upper-outer quadrant of left female breast: Secondary | ICD-10-CM | POA: Insufficient documentation

## 2017-03-10 ENCOUNTER — Ambulatory Visit
Admission: RE | Admit: 2017-03-10 | Discharge: 2017-03-10 | Disposition: A | Discharge status: Home / Self Care | Payer: Medicaid Other | Source: Ambulatory Visit | Attending: Radiation Oncology | Admitting: Radiation Oncology

## 2017-03-10 DIAGNOSIS — Z510 Encounter for antineoplastic radiation therapy: Secondary | ICD-10-CM | POA: Diagnosis not present

## 2017-03-11 ENCOUNTER — Ambulatory Visit
Admission: RE | Admit: 2017-03-11 | Discharge: 2017-03-11 | Disposition: A | Discharge status: Home / Self Care | Payer: Medicaid Other | Source: Ambulatory Visit | Attending: Radiation Oncology | Admitting: Radiation Oncology

## 2017-03-11 DIAGNOSIS — Z171 Estrogen receptor negative status [ER-]: Secondary | ICD-10-CM | POA: Diagnosis not present

## 2017-03-11 DIAGNOSIS — C50412 Malignant neoplasm of upper-outer quadrant of left female breast: Secondary | ICD-10-CM

## 2017-03-11 DIAGNOSIS — Z510 Encounter for antineoplastic radiation therapy: Secondary | ICD-10-CM | POA: Diagnosis not present

## 2017-03-11 MED ORDER — ALRA NON-METALLIC DEODORANT (RAD-ONC)
1.0000 "application " | Freq: Once | TOPICAL | Status: AC
  Administered 2017-03-11: 1 via TOPICAL

## 2017-03-11 MED ORDER — RADIAPLEXRX EX GEL
Freq: Once | CUTANEOUS | Status: AC
  Administered 2017-03-11: 15:00:00 via TOPICAL

## 2017-03-11 MED FILL — GABAPENTIN 300 MG CAPSULE: 300 | 30 days supply | Qty: 30 | Fill #1

## 2017-03-11 NOTE — Progress Notes (Signed)

## 2017-03-12 ENCOUNTER — Ambulatory Visit
Admission: RE | Admit: 2017-03-12 | Discharge: 2017-03-12 | Disposition: A | Discharge status: Home / Self Care | Payer: Medicaid Other | Source: Ambulatory Visit | Attending: Radiation Oncology | Admitting: Radiation Oncology

## 2017-03-12 DIAGNOSIS — Z510 Encounter for antineoplastic radiation therapy: Secondary | ICD-10-CM | POA: Diagnosis not present

## 2017-03-15 ENCOUNTER — Ambulatory Visit
Admission: RE | Admit: 2017-03-15 | Discharge: 2017-03-15 | Disposition: A | Discharge status: Home / Self Care | Payer: Medicaid Other | Source: Ambulatory Visit | Attending: Radiation Oncology | Admitting: Radiation Oncology

## 2017-03-15 ENCOUNTER — Ambulatory Visit: Payer: Medicaid Other

## 2017-03-15 DIAGNOSIS — Z510 Encounter for antineoplastic radiation therapy: Secondary | ICD-10-CM | POA: Diagnosis not present

## 2017-03-15 MED FILL — GABAPENTIN 100 MG CAPSULE: 100 | 30 days supply | Qty: 60 | Fill #1

## 2017-03-16 ENCOUNTER — Ambulatory Visit: Payer: Medicaid Other

## 2017-03-16 ENCOUNTER — Ambulatory Visit
Admission: RE | Admit: 2017-03-16 | Discharge: 2017-03-16 | Disposition: A | Discharge status: Home / Self Care | Payer: Medicaid Other | Source: Ambulatory Visit | Attending: Radiation Oncology | Admitting: Radiation Oncology

## 2017-03-16 DIAGNOSIS — Z510 Encounter for antineoplastic radiation therapy: Secondary | ICD-10-CM | POA: Diagnosis not present

## 2017-03-17 ENCOUNTER — Ambulatory Visit: Payer: Medicaid Other

## 2017-03-17 ENCOUNTER — Ambulatory Visit
Admission: RE | Admit: 2017-03-17 | Discharge: 2017-03-17 | Disposition: A | Discharge status: Home / Self Care | Payer: Medicaid Other | Source: Ambulatory Visit | Attending: Radiation Oncology | Admitting: Radiation Oncology

## 2017-03-17 DIAGNOSIS — Z510 Encounter for antineoplastic radiation therapy: Secondary | ICD-10-CM | POA: Diagnosis not present

## 2017-03-18 ENCOUNTER — Ambulatory Visit
Admission: RE | Admit: 2017-03-18 | Discharge: 2017-03-18 | Disposition: A | Discharge status: Home / Self Care | Payer: Medicaid Other | Source: Ambulatory Visit | Attending: Radiation Oncology | Admitting: Radiation Oncology

## 2017-03-18 ENCOUNTER — Ambulatory Visit: Payer: Medicaid Other

## 2017-03-18 DIAGNOSIS — Z510 Encounter for antineoplastic radiation therapy: Secondary | ICD-10-CM | POA: Diagnosis not present

## 2017-03-19 ENCOUNTER — Ambulatory Visit: Payer: Medicaid Other

## 2017-03-19 ENCOUNTER — Ambulatory Visit
Admission: RE | Admit: 2017-03-19 | Discharge: 2017-03-19 | Disposition: A | Discharge status: Home / Self Care | Payer: Medicaid Other | Source: Ambulatory Visit | Attending: Radiation Oncology | Admitting: Radiation Oncology

## 2017-03-19 DIAGNOSIS — Z510 Encounter for antineoplastic radiation therapy: Secondary | ICD-10-CM | POA: Diagnosis not present

## 2017-03-21 ENCOUNTER — Other Ambulatory Visit: Payer: Self-pay | Admitting: Oncology

## 2017-03-22 ENCOUNTER — Ambulatory Visit: Payer: Medicaid Other

## 2017-03-22 ENCOUNTER — Ambulatory Visit
Admission: RE | Admit: 2017-03-22 | Discharge: 2017-03-22 | Disposition: A | Discharge status: Home / Self Care | Payer: Medicaid Other | Source: Ambulatory Visit | Attending: Radiation Oncology | Admitting: Radiation Oncology

## 2017-03-22 DIAGNOSIS — Z510 Encounter for antineoplastic radiation therapy: Secondary | ICD-10-CM | POA: Diagnosis not present

## 2017-03-23 ENCOUNTER — Ambulatory Visit
Admission: RE | Admit: 2017-03-23 | Discharge: 2017-03-23 | Disposition: A | Discharge status: Home / Self Care | Payer: Medicaid Other | Source: Ambulatory Visit | Attending: Radiation Oncology | Admitting: Radiation Oncology

## 2017-03-23 DIAGNOSIS — Z510 Encounter for antineoplastic radiation therapy: Secondary | ICD-10-CM | POA: Diagnosis not present

## 2017-03-24 ENCOUNTER — Ambulatory Visit
Admission: RE | Admit: 2017-03-24 | Discharge: 2017-03-24 | Disposition: A | Discharge status: Home / Self Care | Payer: Medicaid Other | Source: Ambulatory Visit | Attending: Radiation Oncology | Admitting: Radiation Oncology

## 2017-03-24 DIAGNOSIS — Z510 Encounter for antineoplastic radiation therapy: Secondary | ICD-10-CM | POA: Diagnosis not present

## 2017-03-25 ENCOUNTER — Ambulatory Visit
Admission: RE | Admit: 2017-03-25 | Discharge: 2017-03-25 | Disposition: A | Discharge status: Home / Self Care | Payer: Medicaid Other | Source: Ambulatory Visit | Attending: Radiation Oncology | Admitting: Radiation Oncology

## 2017-03-25 DIAGNOSIS — Z510 Encounter for antineoplastic radiation therapy: Secondary | ICD-10-CM | POA: Diagnosis not present

## 2017-03-26 ENCOUNTER — Ambulatory Visit
Admission: RE | Admit: 2017-03-26 | Discharge: 2017-03-26 | Disposition: A | Discharge status: Home / Self Care | Payer: Medicaid Other | Source: Ambulatory Visit | Attending: Radiation Oncology | Admitting: Radiation Oncology

## 2017-03-26 DIAGNOSIS — Z510 Encounter for antineoplastic radiation therapy: Secondary | ICD-10-CM | POA: Diagnosis not present

## 2017-03-29 ENCOUNTER — Ambulatory Visit
Admission: RE | Admit: 2017-03-29 | Discharge: 2017-03-29 | Disposition: A | Discharge status: Home / Self Care | Payer: Medicaid Other | Source: Ambulatory Visit | Attending: Radiation Oncology | Admitting: Radiation Oncology

## 2017-03-29 DIAGNOSIS — Z510 Encounter for antineoplastic radiation therapy: Secondary | ICD-10-CM | POA: Diagnosis not present

## 2017-03-30 ENCOUNTER — Ambulatory Visit
Admission: RE | Admit: 2017-03-30 | Discharge: 2017-03-30 | Disposition: A | Discharge status: Home / Self Care | Payer: Medicaid Other | Source: Ambulatory Visit | Attending: Radiation Oncology | Admitting: Radiation Oncology

## 2017-03-30 DIAGNOSIS — Z510 Encounter for antineoplastic radiation therapy: Secondary | ICD-10-CM | POA: Diagnosis not present

## 2017-03-31 ENCOUNTER — Ambulatory Visit
Admission: RE | Admit: 2017-03-31 | Discharge: 2017-03-31 | Disposition: A | Discharge status: Home / Self Care | Payer: Medicaid Other | Source: Ambulatory Visit | Attending: Radiation Oncology | Admitting: Radiation Oncology

## 2017-03-31 DIAGNOSIS — Z510 Encounter for antineoplastic radiation therapy: Secondary | ICD-10-CM | POA: Diagnosis not present

## 2017-04-01 ENCOUNTER — Ambulatory Visit
Admission: RE | Admit: 2017-04-01 | Discharge: 2017-04-01 | Disposition: A | Discharge status: Home / Self Care | Payer: Medicaid Other | Source: Ambulatory Visit | Attending: Radiation Oncology | Admitting: Radiation Oncology

## 2017-04-01 DIAGNOSIS — Z510 Encounter for antineoplastic radiation therapy: Secondary | ICD-10-CM | POA: Diagnosis not present

## 2017-04-02 ENCOUNTER — Ambulatory Visit
Admission: RE | Admit: 2017-04-02 | Discharge: 2017-04-02 | Disposition: A | Discharge status: Home / Self Care | Payer: Medicaid Other | Source: Ambulatory Visit | Attending: Radiation Oncology | Admitting: Radiation Oncology

## 2017-04-02 DIAGNOSIS — Z510 Encounter for antineoplastic radiation therapy: Secondary | ICD-10-CM | POA: Diagnosis not present

## 2017-04-05 ENCOUNTER — Ambulatory Visit
Admission: RE | Admit: 2017-04-05 | Discharge: 2017-04-05 | Disposition: A | Discharge status: Home / Self Care | Payer: Medicaid Other | Source: Ambulatory Visit | Attending: Radiation Oncology | Admitting: Radiation Oncology

## 2017-04-05 DIAGNOSIS — Z171 Estrogen receptor negative status [ER-]: Secondary | ICD-10-CM | POA: Insufficient documentation

## 2017-04-05 DIAGNOSIS — Z510 Encounter for antineoplastic radiation therapy: Secondary | ICD-10-CM | POA: Insufficient documentation

## 2017-04-05 DIAGNOSIS — C50412 Malignant neoplasm of upper-outer quadrant of left female breast: Secondary | ICD-10-CM | POA: Diagnosis not present

## 2017-04-05 MED ORDER — RADIAPLEXRX EX GEL
Freq: Once | CUTANEOUS | Status: AC
  Administered 2017-04-05: 17:00:00 via TOPICAL

## 2017-04-06 ENCOUNTER — Ambulatory Visit
Admission: RE | Admit: 2017-04-06 | Discharge: 2017-04-06 | Disposition: A | Discharge status: Home / Self Care | Payer: Medicaid Other | Source: Ambulatory Visit | Attending: Radiation Oncology | Admitting: Radiation Oncology

## 2017-04-06 DIAGNOSIS — Z510 Encounter for antineoplastic radiation therapy: Secondary | ICD-10-CM | POA: Diagnosis not present

## 2017-04-07 ENCOUNTER — Ambulatory Visit
Admission: RE | Admit: 2017-04-07 | Discharge: 2017-04-07 | Disposition: A | Discharge status: Home / Self Care | Payer: Medicaid Other | Source: Ambulatory Visit | Attending: Radiation Oncology | Admitting: Radiation Oncology

## 2017-04-07 DIAGNOSIS — Z510 Encounter for antineoplastic radiation therapy: Secondary | ICD-10-CM | POA: Diagnosis not present

## 2017-04-08 ENCOUNTER — Ambulatory Visit
Admission: RE | Admit: 2017-04-08 | Discharge: 2017-04-08 | Disposition: A | Discharge status: Home / Self Care | Payer: Medicaid Other | Source: Ambulatory Visit | Attending: Radiation Oncology | Admitting: Radiation Oncology

## 2017-04-08 DIAGNOSIS — Z510 Encounter for antineoplastic radiation therapy: Secondary | ICD-10-CM | POA: Diagnosis not present

## 2017-04-09 ENCOUNTER — Ambulatory Visit
Admission: RE | Admit: 2017-04-09 | Discharge: 2017-04-09 | Disposition: A | Discharge status: Home / Self Care | Payer: Medicaid Other | Source: Ambulatory Visit | Attending: Radiation Oncology | Admitting: Radiation Oncology

## 2017-04-09 DIAGNOSIS — Z171 Estrogen receptor negative status [ER-]: Principal | ICD-10-CM

## 2017-04-09 DIAGNOSIS — C50412 Malignant neoplasm of upper-outer quadrant of left female breast: Secondary | ICD-10-CM

## 2017-04-09 DIAGNOSIS — Z510 Encounter for antineoplastic radiation therapy: Secondary | ICD-10-CM | POA: Diagnosis not present

## 2017-04-09 MED ORDER — SILVER SULFADIAZINE 1 % EX CREA
TOPICAL_CREAM | Freq: Two times a day (BID) | CUTANEOUS | Status: DC
  Administered 2017-04-09: 14:00:00 via TOPICAL

## 2017-04-12 ENCOUNTER — Ambulatory Visit: Payer: Medicaid Other | Admitting: Radiation Oncology

## 2017-04-12 ENCOUNTER — Ambulatory Visit
Admission: RE | Admit: 2017-04-12 | Discharge: 2017-04-12 | Disposition: A | Discharge status: Home / Self Care | Payer: Medicaid Other | Source: Ambulatory Visit | Attending: Radiation Oncology | Admitting: Radiation Oncology

## 2017-04-12 DIAGNOSIS — Z510 Encounter for antineoplastic radiation therapy: Secondary | ICD-10-CM | POA: Diagnosis not present

## 2017-04-13 ENCOUNTER — Ambulatory Visit
Admission: RE | Admit: 2017-04-13 | Discharge: 2017-04-13 | Disposition: A | Discharge status: Home / Self Care | Payer: Medicaid Other | Source: Ambulatory Visit | Attending: Radiation Oncology | Admitting: Radiation Oncology

## 2017-04-13 DIAGNOSIS — Z510 Encounter for antineoplastic radiation therapy: Secondary | ICD-10-CM | POA: Diagnosis not present

## 2017-04-14 ENCOUNTER — Ambulatory Visit
Admission: RE | Admit: 2017-04-14 | Discharge: 2017-04-14 | Disposition: A | Discharge status: Home / Self Care | Payer: Medicaid Other | Source: Ambulatory Visit | Attending: Radiation Oncology | Admitting: Radiation Oncology

## 2017-04-14 ENCOUNTER — Other Ambulatory Visit: Payer: Self-pay | Admitting: Oncology

## 2017-04-14 DIAGNOSIS — Z171 Estrogen receptor negative status [ER-]: Principal | ICD-10-CM

## 2017-04-14 DIAGNOSIS — Z510 Encounter for antineoplastic radiation therapy: Secondary | ICD-10-CM | POA: Diagnosis not present

## 2017-04-14 DIAGNOSIS — C50412 Malignant neoplasm of upper-outer quadrant of left female breast: Secondary | ICD-10-CM

## 2017-04-14 MED ORDER — SILVER SULFADIAZINE 1 % EX CREA
TOPICAL_CREAM | Freq: Once | CUTANEOUS | Status: AC
  Administered 2017-04-14: 16:00:00 via TOPICAL

## 2017-04-14 MED FILL — GABAPENTIN 300 MG CAPSULE: 300 | 30 days supply | Qty: 30 | Fill #2

## 2017-04-14 MED FILL — GABAPENTIN 100 MG CAPSULE: 100 | 30 days supply | Qty: 60 | Fill #0

## 2017-04-15 ENCOUNTER — Ambulatory Visit
Admission: RE | Admit: 2017-04-15 | Discharge: 2017-04-15 | Disposition: A | Discharge status: Home / Self Care | Payer: Medicaid Other | Source: Ambulatory Visit | Attending: Radiation Oncology | Admitting: Radiation Oncology

## 2017-04-15 DIAGNOSIS — Z510 Encounter for antineoplastic radiation therapy: Secondary | ICD-10-CM | POA: Diagnosis not present

## 2017-04-16 ENCOUNTER — Ambulatory Visit
Admission: RE | Admit: 2017-04-16 | Discharge: 2017-04-16 | Disposition: A | Discharge status: Home / Self Care | Payer: Medicaid Other | Source: Ambulatory Visit | Attending: Radiation Oncology | Admitting: Radiation Oncology

## 2017-04-16 DIAGNOSIS — Z510 Encounter for antineoplastic radiation therapy: Secondary | ICD-10-CM | POA: Diagnosis not present

## 2017-04-19 ENCOUNTER — Ambulatory Visit
Admission: RE | Admit: 2017-04-19 | Discharge: 2017-04-19 | Disposition: A | Discharge status: Home / Self Care | Payer: Medicaid Other | Source: Ambulatory Visit | Attending: Radiation Oncology | Admitting: Radiation Oncology

## 2017-04-19 DIAGNOSIS — C50412 Malignant neoplasm of upper-outer quadrant of left female breast: Secondary | ICD-10-CM

## 2017-04-19 DIAGNOSIS — Z171 Estrogen receptor negative status [ER-]: Principal | ICD-10-CM

## 2017-04-19 DIAGNOSIS — Z510 Encounter for antineoplastic radiation therapy: Secondary | ICD-10-CM | POA: Diagnosis not present

## 2017-04-19 MED ORDER — SILVER SULFADIAZINE 1 % EX CREA
TOPICAL_CREAM | Freq: Two times a day (BID) | CUTANEOUS | Status: DC
  Administered 2017-04-19: 16:00:00 via TOPICAL

## 2017-04-20 ENCOUNTER — Ambulatory Visit
Admission: RE | Admit: 2017-04-20 | Discharge: 2017-04-20 | Disposition: A | Discharge status: Home / Self Care | Payer: Medicaid Other | Source: Ambulatory Visit | Attending: Radiation Oncology | Admitting: Radiation Oncology

## 2017-04-20 ENCOUNTER — Other Ambulatory Visit: Payer: Self-pay | Admitting: Radiation Oncology

## 2017-04-20 DIAGNOSIS — C50412 Malignant neoplasm of upper-outer quadrant of left female breast: Secondary | ICD-10-CM

## 2017-04-20 DIAGNOSIS — Z171 Estrogen receptor negative status [ER-]: Principal | ICD-10-CM

## 2017-04-20 DIAGNOSIS — Z510 Encounter for antineoplastic radiation therapy: Secondary | ICD-10-CM | POA: Diagnosis not present

## 2017-04-20 MED ORDER — OXYCODONE-ACETAMINOPHEN 5-325 MG PO TABS: 1.0000 | ORAL_TABLET | Freq: Four times a day (QID) | ORAL | 0 refills | Status: DC | PRN

## 2017-04-20 MED ORDER — SILVER SULFADIAZINE 1 % EX CREA
TOPICAL_CREAM | Freq: Once | CUTANEOUS | Status: AC
  Administered 2017-04-20: 16:00:00 via TOPICAL

## 2017-04-20 MED FILL — OXYCODONE-ACETAMINOPHEN 5-3: 5-325 | 9 days supply | Qty: 20 | Fill #0

## 2017-04-21 ENCOUNTER — Ambulatory Visit
Admission: RE | Admit: 2017-04-21 | Discharge: 2017-04-21 | Disposition: A | Discharge status: Home / Self Care | Payer: Medicaid Other | Source: Ambulatory Visit | Attending: Radiation Oncology | Admitting: Radiation Oncology

## 2017-04-21 DIAGNOSIS — Z510 Encounter for antineoplastic radiation therapy: Secondary | ICD-10-CM | POA: Diagnosis not present

## 2017-04-22 ENCOUNTER — Other Ambulatory Visit: Payer: Medicaid Other

## 2017-04-22 ENCOUNTER — Ambulatory Visit: Payer: Medicaid Other | Admitting: Oncology

## 2017-04-23 ENCOUNTER — Telehealth: Payer: Self-pay | Admitting: Oncology

## 2017-04-23 NOTE — Telephone Encounter (Signed)
Called pt re appt that was added per GM being ouot on PAL - spoke w/ pt and confirmed appt.

## 2017-05-05 ENCOUNTER — Encounter: Payer: Self-pay | Admitting: Radiation Oncology

## 2017-05-05 NOTE — Progress Notes (Signed)
  Radiation Oncology         [phone] ________________________________  Name: [name] MRN: [id number]  Date: 05/05/2017  DOB: 14-Nov-1970  End of Treatment Note  Diagnosis:  Cancer Staging Malignant neoplasm of upper-outer quadrant of left breast in female, estrogen receptor negative ([place]) Staging form: Breast, AJCC 8th Edition - Clinical stage from 07/01/2016: Stage IIIB (cT2, cN1(f), cM0, G3, ER: Negative, PR: Negative, HER2: Negative) - Unsigned   Clinical StageT2-T3N1 M0, Pathologic Stage ypT0 ypN0 LeftBreast UOQ Invasive Ductal Carcinoma, ERNegative/ PR Negative/ Her2 Negative, GradeIII     Indication for treatment:  Curative       Radiation treatment dates:   03/11/2017-04/21/2017  Site/dose:   1. Left breast and SCV/axillary nodes, 2 Gy x 25 fractions for a total dose of 50 Gy           2. Boost, 2 Gy x  5 fractions for a total dose of 10 Gy  Beams/energy:   1. 3D, 15X//10X        2. isodose, 15X//10X  Narrative: The patient tolerated radiation treatment relatively well. At the beginning of her treatments, she denied pain, fatigue, or skin changes and noted that she was using radiaplex. On PE, no skin changes were noted. Towards the end of her treatments, she reported   pain to her left upper chest, left axilla, and underneath her left breast. She also noted drainage underneath her left breast and fatigue. She was noted to be using silvadene cream and neosporin, along with neurontin for nerve pain. On PE, it was noted 3 areas of moist desquamation over the left breast.   Plan: The patient has completed radiation treatment. The patient will return to radiation oncology clinic for routine followup in one month. I advised them to call or return sooner if they have any questions or concerns related to their recovery or treatment.  -----------------------------------  [name], MD  This document serves as a record of services personally performed by [name], MD.  It was created on her behalf by Steva Colder, a trained medical scribe. The creation of this record is based on the scribe's personal observations and the provider's statements to them. This document has been checked and approved by the attending provider.

## 2017-05-11 ENCOUNTER — Telehealth: Payer: Self-pay

## 2017-05-11 NOTE — Telephone Encounter (Signed)
Returned pt's call, she wants to let Dr [name] know that her daughter has a brain mass, being admitted to hospital and she is very concerned. Told her I would pass along the message to him. Offered her emotional support. No other needs per pt at this time.  Gave message to Dr [name].

## 2017-05-14 ENCOUNTER — Ambulatory Visit: Payer: Medicaid Other | Admitting: Oncology

## 2017-05-14 ENCOUNTER — Other Ambulatory Visit: Payer: Medicaid Other

## 2017-05-14 NOTE — Telephone Encounter (Signed)
Opened by error.

## 2017-05-17 MED FILL — GABAPENTIN 100 MG CAPSULE: 100 | 30 days supply | Qty: 60 | Fill #1

## 2017-05-17 MED FILL — GABAPENTIN 300 MG CAPSULE: 300 | 30 days supply | Qty: 30 | Fill #3

## 2017-05-21 ENCOUNTER — Inpatient Hospital Stay: Payer: Medicaid Other | Admitting: Oncology

## 2017-05-21 ENCOUNTER — Inpatient Hospital Stay: Payer: Medicaid Other | Attending: Oncology

## 2017-05-21 ENCOUNTER — Inpatient Hospital Stay: Payer: Medicaid Other

## 2017-05-21 DIAGNOSIS — Z853 Personal history of malignant neoplasm of breast: Secondary | ICD-10-CM | POA: Insufficient documentation

## 2017-05-26 ENCOUNTER — Encounter: Payer: Self-pay | Admitting: Radiation Oncology

## 2017-05-26 NOTE — Progress Notes (Signed)
Ms. Grupe presents for follow up of radiation completed 04/21/2017 to her Left Breast. She saw [name] 05/28/17. She has healed well from her radiation. She has redness present to her Left Breast. She does have a reddened area to her Left lateral breast area that has become more prominent over the past few days. She is applying neosporin to this area. She is not using any other type of cream at this time, but is aware that she can use a vitamin E cream.   BP 116/80 (BP Location: Right Arm, Patient Position: Sitting, Cuff Size: Large)   Pulse 75   Temp 98.2 F (36.8 C) (Oral)   Resp 20   Ht 5' 5.5" (1.664 m)   Wt 253 lb (114.8 kg)   SpO2 98%   BMI 41.46 kg/m    Wt Readings from Last 3 Encounters:  06/02/17 253 lb (114.8 kg)  06/01/17 252 lb 12 oz (114.6 kg)  05/28/17 253 lb 9.6 oz (115 kg)

## 2017-05-27 NOTE — Progress Notes (Signed)
[place]  [phone] [phone]     ID: [name] DOB: 1970-07-29  MR#: [id number]  JYN#:[id number]  Patient Care Team: [name], MD as PCP - General (Internal Medicine) [name], MD as Consulting Physician (General Surgery) Keelan Pomerleau, [name], MD as Consulting Physician (Oncology) [name], MD as Attending Physician (Radiation Oncology) OTHER MD:  CHIEF COMPLAINT: Triple negative breast cancer  CURRENT TREATMENT: adjuvant radiation pending  INTERVAL HISTORY: [name] returns today for a follow-up and treatment of her triple negative breast cancer.  Since her last visit here she completed her radiation treatments.  She generally did quite well with those until she got to treatment #18.  After that she had significant "skin burning" and fatigue.  She has since recovered nicely, and has been able to get back to work.    The big problem of course that the entire family is facing is the fact that all of a sudden the patient's older daughter [name] developed blindness in one eye and has been found to have a brain tumor.  The pathology after biopsy is still pending.  At this point according to [facility] the girl has lost her vision in her left eye and has very limited vision in the right eye.  They are waiting on the final path to start treatment.   REVIEW OF SYSTEMS: [name] has not been able to work full-time recently because of the daughters long hospitalization and other problems, and she is also currently not on a regular exercise program.  However she denies any unusual headaches, visual changes, nausea, vomiting, cough, phlegm production, pleurisy, shortness of breath, or any change in bowel or bladder habits.  A detailed review of systems today was stable   BREAST CANCER HISTORY: From the original intake note:  The patient was seen in the emergency room 05/16/2016 with nonspecific complaints but also noting that she had a  tender lump in her lateral left breast which she said she had noted the day before. Exam by the emergency room physician confirmed a 3 cm soft left lateral breast mass without overlying erythema or nipple changes. This was felt to be most consistent with fibrocystic change but the patient was referred back to her primary care physician for further evaluation. She saw the physician assistant in May 18 and then Dr. Wynetta Emery on 06/12/2016 who scheduled a bilateral diagnostic mammography with tomography and left breast ultrasonography at the [place] 06/18/2016. This found the breast density to be category B. In the left breast upper outer quadrant there was an area of asymmetry measuring up to 10.3 cm. On exam this was firm and palpable and on ultrasound there was an irregular mass measuring 2.5 cm with some subtle changes in the surrounding tissue. In the left lower axilla there were 2 suspicious-looking lymph nodes.  Biopsy of the left breast upper outer quadrant and one of the suspicious lymph nodes 06/19/2016 showed ([id number]) both to be involved by invasive ductal carcinoma, grade 3, estrogen and progesterone receptor negative, HER-2 not amplified with a signals ratio being 1.53-1.74 and the number per cell 2.35-3.05. The MIB-1 was 90%.  The patient's subsequent history is as detailed below.   PAST MEDICAL HISTORY: Past Medical History:  Diagnosis Date  . Breast cancer ([place])    lf breast  . Complication of anesthesia    DAY SURGERY 2010 OR 2011 ASPIRIATED AND STAYED OVERNIGHT  . History of radiation therapy 03/11/17- 04/21/17   Left breast, 2 Gy  in 25 fractions for a total dose of 50 Gy, Boost, 2 Gy in 5 fractions for a total dose of 10 Gy  . Hypertension    OFF MEDS  SINCE CHEMO X 5 MONTHS  . Malignant neoplasm of upper-outer quadrant of left breast in female, estrogen receptor negative ([place]) 06/23/2016    PAST SURGICAL HISTORY: Past Surgical History:  Procedure Laterality Date  . ANKLE  SURGERY    . BREAST LUMPECTOMY WITH RADIOACTIVE SEED AND SENTINEL LYMPH NODE BIOPSY Left 01/27/2017   Procedure: LEFT BREAST LUMPECTOMY WITH RADIOACTIVE SEED LOCALIZATION, LEFT AXILLARY DEEP LYMPH NODE BIOPSY WITH RADIOACTIVE SEED LOCALIZATION, LEFT AXILLARY SENTINEL LYMPH NODE MAPPING AND BIOPSY WITH BLUE DYE INJECTION;  Surgeon: [name], MD;  Location: [facility];  Service: General;  Laterality: Left;  . IR FLUORO GUIDE PORT INSERTION RIGHT  07/14/2016  . IR US GUIDE VASC ACCESS RIGHT  07/14/2016  . PORT-A-CATH REMOVAL Right 01/27/2017   Procedure: REMOVAL PORT-A-CATH;  Surgeon: [name], MD;  Location: [facility];  Service: General;  Laterality: Right;  . TUBAL LIGATION    . WISDOM TOOTH EXTRACTION      FAMILY HISTORY Family History  Problem Relation Age of Onset  . Diabetes Mother   . Hypertension Mother   . Arthritis Mother   . Colon cancer Father 24       d.76 metastatic at time of diagnosis  . Diabetes Father   . Heart disease Father   . Hypertension Father   . Breast cancer Maternal Grandmother 41       d.60s  . Breast cancer Maternal Aunt 52  . Cervical cancer Maternal Aunt 60  . Breast cancer Maternal Aunt 46       d.50s  . Cancer Maternal Uncle        d.62s unspecified type of cancer  . Breast cancer Cousin 61       paternal first-cousin (daughter of unaffected aunt)  . Cancer Maternal Aunt 80       "Female Cancer"  . Cancer Maternal Aunt        unknown cancer  The patient's father died at age 69 from colon cancer which she never had treated. The patient's mother is living, 4 years old as of June 2018. The patient has one brother, one sister. On the mother's side there is a history of breast cancer in the grandmother, age 62, and then 1 aunt with breast cancer age 65, and another with lung cancer age 29. There is no history of ovarian cancer in the family.  GYNECOLOGIC HISTORY:  No LMP recorded. (Menstrual status: Other). Menarche age 59, first live birth age 58.  She is GX P4. Her periods have never been regular. She had a period in June 2018 but not for 4 months before that. She took birth control for some years remotely with no complications. She is status post bilateral tubal ligation.  SOCIAL HISTORY:  The patient's husband owns and runs a food truck in the area. They serve mostly [place] and [place] food. 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 Her husband Shaley Leavens (goes by "[name]") is originally from [name]. The patient has two children from her first marriage. [name], who is 48 years old, as of June 2018, who was recently in the [employer], but now is taking courses at [employer] and [name], who is studying psychology at [name]. Together with [name] the  patient has two children who are [name] and hila, aged 59 and 56.     ADVANCED DIRECTIVES: Not in place   HEALTH MAINTENANCE: Social History   Tobacco Use  . Smoking status: Never Smoker  . Smokeless tobacco: Never Used  Substance Use Topics  . Alcohol use: No  . Drug use: No     Colonoscopy:Never   UEA:VWUJ 2018  Bone density:Never   Allergies  Allergen Reactions  . Carboplatin Other (See Comments)    Red, blotchy, hot    Current Outpatient Medications  Medication Sig Dispense Refill  . gabapentin (NEURONTIN) 100 MG capsule TAKE 2 CAPSULES BY MOUTH DAILY 60 capsule 1  . gabapentin (NEURONTIN) 300 MG capsule Take 1 capsule (300 mg total) by mouth at bedtime. 90 capsule 4  . ibuprofen (ADVIL,MOTRIN) 200 MG tablet Take 1-2 tablets (200-400 mg total) by mouth every 8 (eight) hours as needed.     No current facility-administered medications for this visit.     OBJECTIVE: Middle-aged white woman who appears stated age  47:   05/28/17 1419  BP: (!) 124/59  Pulse: 84  Resp: 18  Temp: 98.2 F (36.8 C)  SpO2: 100%     Body mass index is 39.72 kg/m.   Filed  Weights   05/28/17 1419  Weight: 253 lb 9.6 oz (115 kg)   ECOG FS:1 - Symptomatic but completely ambulatory  Sclerae unicteric, EOMs intact Oropharynx clear and moist No cervical or supraclavicular adenopathy Lungs no rales or rhonchi Heart regular rate and rhythm Abd soft, nontender, positive bowel sounds MSK no focal spinal tenderness, no upper extremity lymphedema Neuro: nonfocal, well oriented, appropriate affect Breasts: Right breast is benign.  The left breast is status post surgery and radiation.  There is still mild hyperpigmentation.  There is mild skin coarsening.  These are expected changes.  There is no evidence of disease recurrence.  Both axillae are benign.  LAB RESULTS:  CMP     Component Value Date/Time   NA 135 01/25/2017 1447   NA 140 01/07/2017 1342   K 3.5 01/25/2017 1447   K 3.5 01/07/2017 1342   CL 97 (L) 01/25/2017 1447   CO2 24 01/25/2017 1447   CO2 27 01/07/2017 1342   GLUCOSE 116 (H) 01/25/2017 1447   GLUCOSE 148 (H) 01/07/2017 1342   BUN 8 01/25/2017 1447   BUN 6.2 (L) 01/07/2017 1342   CREATININE 0.75 01/25/2017 1447   CREATININE 0.7 01/07/2017 1342   CALCIUM 9.7 01/25/2017 1447   CALCIUM 9.4 01/07/2017 1342   PROT 7.6 01/25/2017 1447   PROT 7.0 01/07/2017 1342   ALBUMIN 4.2 01/25/2017 1447   ALBUMIN 3.7 01/07/2017 1342   AST 35 01/25/2017 1447   AST 23 01/07/2017 1342   ALT 29 01/25/2017 1447   ALT 24 01/07/2017 1342   ALKPHOS 96 01/25/2017 1447   ALKPHOS 105 01/07/2017 1342   BILITOT 1.2 01/25/2017 1447   BILITOT 0.84 01/07/2017 1342   GFRNONAA >60 01/25/2017 1447   GFRAA >60 01/25/2017 1447    No results found for: TOTALPROTELP, ALBUMINELP, A1GS, A2GS, BETS, BETA2SER, GAMS, MSPIKE, SPEI  No results found for: KPAFRELGTCHN, LAMBDASER, KAPLAMBRATIO  Lab Results  Component Value Date   WBC 3.0 (L) 05/28/2017   NEUTROABS 1.4 (L) 05/28/2017   HGB 12.1 05/28/2017   HCT 35.9 05/28/2017   MCV 92.3 05/28/2017   PLT 126 (L)  05/28/2017      Chemistry      Component Value Date/Time  NA 135 01/25/2017 1447   NA 140 01/07/2017 1342   K 3.5 01/25/2017 1447   K 3.5 01/07/2017 1342   CL 97 (L) 01/25/2017 1447   CO2 24 01/25/2017 1447   CO2 27 01/07/2017 1342   BUN 8 01/25/2017 1447   BUN 6.2 (L) 01/07/2017 1342   CREATININE 0.75 01/25/2017 1447   CREATININE 0.7 01/07/2017 1342      Component Value Date/Time   CALCIUM 9.7 01/25/2017 1447   CALCIUM 9.4 01/07/2017 1342   ALKPHOS 96 01/25/2017 1447   ALKPHOS 105 01/07/2017 1342   AST 35 01/25/2017 1447   AST 23 01/07/2017 1342   ALT 29 01/25/2017 1447   ALT 24 01/07/2017 1342   BILITOT 1.2 01/25/2017 1447   BILITOT 0.84 01/07/2017 1342       No results found for: LABCA2  No components found for: [id number]  No results for input(s): INR in the last 168 hours.  Urinalysis    Component Value Date/Time   COLORURINE YELLOW 10/24/2016 1621   APPEARANCEUR CLEAR 10/24/2016 1621   LABSPEC 1.025 11/23/2016 1251   PHURINE 6.0 11/23/2016 1251   PHURINE 5.5 10/24/2016 1621   GLUCOSEU Negative 11/23/2016 1251   HGBUR Negative 11/23/2016 1251   HGBUR NEGATIVE 10/24/2016 1621   BILIRUBINUR Negative 11/23/2016 1251   KETONESUR Negative 11/23/2016 [place] 10/24/2016 1621   PROTEINUR Negative 11/23/2016 1251   PROTEINUR NEGATIVE 10/24/2016 1621   UROBILINOGEN 0.2 11/23/2016 1251   NITRITE Negative 11/23/2016 1251   NITRITE NEGATIVE 10/24/2016 1621   LEUKOCYTESUR Negative 11/23/2016 1251     STUDIES: Repeat mammography is due in July  ELIGIBLE FOR AVAILABLE RESEARCH PROTOCOL: no   ASSESSMENT: 47 y.o. 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 Additional testing of the patient's mother and maternal aunt is pending. Otherwise [facility]'s Common Hereditary Cancers Panel found no deleterious mutations in APC, ATM, AXIN2, BARD1, BMPR1A, BRCA1, BRCA2, BRIP1, CDH1, CDKN2A, CHEK2, CTNNA1, DICER1, EPCAM, GREM1, HOXB13, KIT, MEN1, MLH1, MSH2, MSH3, MSH6, MUTYH, NBN, NF1, NTHL1, PALB2, PDGFRA, PMS2, POLD1, POLE, PTEN, RAD50, RAD51C, RAD51D, SDHA, SDHB, SDHC, SDHD, SMAD4, SMARCA4, STK11, TP53, TSC1, TSC2, and VHL.  (2) neoadjuvant chemotherapy to consist of doxorubicin and cyclophosphamide in dose dense fashion 4 completed 09/10/2016 to be followed by paclitaxel weekly 12 given with carboplatin   (a) cycle 4 of cyclophosphamide and doxorubicin was delayed 10 days and dose decreased 10% because of febrile neutropenia after cycle 3  (b) cycle 6 of Paclitaxel and Carboplatin delayed due to neutropenia, therefore Granix added to Wednesday, Thursday, Friday following chemotherapy days  (3) status post left lumpectomy and left axillary sentinel lymph node sampling 01/27/2017 with pathology showing a complete pathologic response (ypT0 ypN0); 5 left axillary lymph nodes removed  (4) adjuvant radiation 03/11/2017-04/21/2017 Site/dose:   1. Left breast, 2 Gy in 25 fractions for a total dose of 50 Gy                      2. Boost, 2 Gy in 5 fractions for a total dose of 10 Gy     PLAN: Allison completed  her systemic and local therapy and is now starting long-term follow-up.  We went over her lab work which is shows a slightly depressed marrow consistent with her prior chemotherapy.  That may or may not normalize.  Certainly the normal ANC and the fact that the platelet count is over 100,000 indicate that she should have no problems even if the counts remain slightly low as they are now  Of course they are mostly concerned about their daughter.   Hopefully they will get good news when the final pathology comes back.  That should happen sometime middle of next week  I am going to see her again in 3 months.  If everything is going well at that time I will see her 6 months thereafter.  In the meantime I have encouraged her to be as active as possible  She knows to call for any other issues that may develop before her next visit here.   Beula Joyner, [name], MD  05/28/17 2:48 PM Medical Oncology and Hematology [facility] [address] [place], [place] [address] Tel. [phone]    Fax. [phone]  This document serves as a record of services personally performed by Chauncey Cruel, MD. It was created on his behalf by Margit Banda, a trained medical scribe. The creation of this record is based on the scribe's personal observations and the provider's statements to them.   I have reviewed the above documentation for accuracy and completeness, and I agree with the above.

## 2017-05-28 ENCOUNTER — Inpatient Hospital Stay (HOSPITAL_BASED_OUTPATIENT_CLINIC_OR_DEPARTMENT_OTHER): Payer: Medicaid Other | Admitting: Oncology

## 2017-05-28 ENCOUNTER — Telehealth: Payer: Self-pay | Admitting: Oncology

## 2017-05-28 ENCOUNTER — Inpatient Hospital Stay: Payer: Medicaid Other

## 2017-05-28 ENCOUNTER — Other Ambulatory Visit: Payer: Self-pay | Admitting: Oncology

## 2017-05-28 VITALS — BP 124/59 | HR 84 | Temp 98.2°F | Resp 18 | Ht 67.0 in | Wt 253.6 lb

## 2017-05-28 DIAGNOSIS — C50412 Malignant neoplasm of upper-outer quadrant of left female breast: Secondary | ICD-10-CM

## 2017-05-28 DIAGNOSIS — Z171 Estrogen receptor negative status [ER-]: Principal | ICD-10-CM

## 2017-05-28 DIAGNOSIS — Z853 Personal history of malignant neoplasm of breast: Secondary | ICD-10-CM

## 2017-05-28 LAB — CBC WITH DIFFERENTIAL (CANCER CENTER ONLY)
BASOS ABS: 0 10*3/uL (ref 0.0–0.1)
Basophils Relative: 1 %
EOS PCT: 5 %
Eosinophils Absolute: 0.1 10*3/uL (ref 0.0–0.5)
HCT: 35.9 % (ref 34.8–46.6)
HEMOGLOBIN: 12.1 g/dL (ref 11.6–15.9)
LYMPHS PCT: 43 %
Lymphs Abs: 1.3 10*3/uL (ref 0.9–3.3)
MCH: 31.1 pg (ref 25.1–34.0)
MCHC: 33.7 g/dL (ref 31.5–36.0)
MCV: 92.3 fL (ref 79.5–101.0)
Monocytes Absolute: 0.2 10*3/uL (ref 0.1–0.9)
Monocytes Relative: 5 %
NEUTROS ABS: 1.4 10*3/uL — AB (ref 1.5–6.5)
Neutrophils Relative %: 46 %
PLATELETS: 126 10*3/uL — AB (ref 145–400)
RBC: 3.89 MIL/uL (ref 3.70–5.45)
RDW: 14.6 % — ABNORMAL HIGH (ref 11.2–14.5)
WBC Count: 3 10*3/uL — ABNORMAL LOW (ref 3.9–10.3)

## 2017-05-28 LAB — COMPREHENSIVE METABOLIC PANEL
ALK PHOS: 83 U/L (ref 38–126)
ALT: 20 U/L (ref 14–54)
AST: 21 U/L (ref 15–41)
Albumin: 3.8 g/dL (ref 3.5–5.0)
Anion gap: 9 (ref 5–15)
BUN: 10 mg/dL (ref 6–20)
CALCIUM: 9 mg/dL (ref 8.9–10.3)
CO2: 24 mmol/L (ref 22–32)
CREATININE: 0.61 mg/dL (ref 0.44–1.00)
Chloride: 106 mmol/L (ref 101–111)
GFR calc Af Amer: >60 mL/min (ref >60)
Glucose, Bld: 100 mg/dL — ABNORMAL HIGH (ref 65–99)
Potassium: 4 mmol/L (ref 3.5–5.1)
Sodium: 139 mmol/L (ref 135–145)
Total Bilirubin: 0.8 mg/dL (ref 0.3–1.2)
Total Protein: 6.9 g/dL (ref 6.5–8.1)

## 2017-05-28 NOTE — Telephone Encounter (Signed)
Gave avs and calendar ° °

## 2017-06-01 ENCOUNTER — Encounter (HOSPITAL_COMMUNITY): Payer: Self-pay | Admitting: Cardiology

## 2017-06-01 ENCOUNTER — Other Ambulatory Visit: Payer: Self-pay

## 2017-06-01 ENCOUNTER — Ambulatory Visit (HOSPITAL_BASED_OUTPATIENT_CLINIC_OR_DEPARTMENT_OTHER)
Admission: RE | Admit: 2017-06-01 | Discharge: 2017-06-01 | Disposition: A | Discharge status: Home / Self Care | Payer: Medicaid Other | Source: Ambulatory Visit | Attending: Cardiology | Admitting: Cardiology

## 2017-06-01 ENCOUNTER — Other Ambulatory Visit (HOSPITAL_COMMUNITY): Payer: Self-pay

## 2017-06-01 ENCOUNTER — Ambulatory Visit (HOSPITAL_COMMUNITY)
Admission: RE | Admit: 2017-06-01 | Discharge: 2017-06-01 | Disposition: A | Discharge status: Home / Self Care | Payer: Medicaid Other | Source: Ambulatory Visit | Attending: Internal Medicine | Admitting: Internal Medicine

## 2017-06-01 VITALS — BP 136/87 | HR 74 | Wt 252.8 lb

## 2017-06-01 DIAGNOSIS — Z803 Family history of malignant neoplasm of breast: Secondary | ICD-10-CM | POA: Diagnosis not present

## 2017-06-01 DIAGNOSIS — C50412 Malignant neoplasm of upper-outer quadrant of left female breast: Secondary | ICD-10-CM

## 2017-06-01 DIAGNOSIS — Z171 Estrogen receptor negative status [ER-]: Secondary | ICD-10-CM | POA: Diagnosis not present

## 2017-06-01 DIAGNOSIS — Z79899 Other long term (current) drug therapy: Secondary | ICD-10-CM | POA: Insufficient documentation

## 2017-06-01 NOTE — Progress Notes (Signed)
  Echocardiogram 2D Echocardiogram has been performed.  [name] 06/01/2017, 2:00 PM

## 2017-06-02 ENCOUNTER — Encounter: Payer: Self-pay | Admitting: Radiation Oncology

## 2017-06-02 ENCOUNTER — Ambulatory Visit
Admission: RE | Admit: 2017-06-02 | Discharge: 2017-06-02 | Disposition: A | Discharge status: Home / Self Care | Payer: Medicaid Other | Source: Ambulatory Visit | Attending: Radiation Oncology | Admitting: Radiation Oncology

## 2017-06-02 ENCOUNTER — Other Ambulatory Visit: Payer: Self-pay

## 2017-06-02 VITALS — BP 116/80 | HR 75 | Temp 98.2°F | Resp 20 | Ht 65.5 in | Wt 253.0 lb

## 2017-06-02 DIAGNOSIS — Z923 Personal history of irradiation: Secondary | ICD-10-CM | POA: Diagnosis not present

## 2017-06-02 DIAGNOSIS — R238 Other skin changes: Secondary | ICD-10-CM | POA: Insufficient documentation

## 2017-06-02 DIAGNOSIS — C50412 Malignant neoplasm of upper-outer quadrant of left female breast: Secondary | ICD-10-CM

## 2017-06-02 DIAGNOSIS — Z79899 Other long term (current) drug therapy: Secondary | ICD-10-CM | POA: Diagnosis not present

## 2017-06-02 DIAGNOSIS — Z171 Estrogen receptor negative status [ER-]: Secondary | ICD-10-CM | POA: Insufficient documentation

## 2017-06-02 HISTORY — DX: Personal history of irradiation: Z92.3

## 2017-06-02 NOTE — Progress Notes (Signed)
Oncology: [name]  47 yo with breast cancer was referred by [name] for cardio-oncology evaluation.  Breast cancer was diagnosed in 5/18 on left with positive axillary nodes.  ER-/PR-/HER2-.  4 cycles doxorubicin/cyclophosphamide completed 09/10/16.  Has had 12 cycles paclitaxel/carboplatin.  She had lumpectomy in 1/19 and radiation in 3/19.     She has no prior cardiac history. No significant exertional dyspnea or chest pain.   PMH: 1. Breast cancer: Diagnosed in 5/18 on left with positive axillary nodes.  ER-/PR-/HER2-.  4 cycles doxorubicin/cyclophosphamide completed 09/10/16.  Completed 12 cycles paclitaxel/carboplatin.  Had lumpectomy in 1/19 and radiation in 3/19.   - Echo (7/18): EF 60-65% - Echo (10/18): EF 60-65%, GLS -20.4% - Echo (5/19): EF 55-60%, GLS -21%  Social History   Socioeconomic History  . Marital status: Married    Spouse name: Not on file  . Number of children: Not on file  . Years of education: Not on file  . Highest education level: Not on file  Occupational History  . Not on file  Social Needs  . Financial resource strain: Not on file  . Food insecurity:    Worry: Not on file    Inability: Not on file  . Transportation needs:    Medical: Not on file    Non-medical: Not on file  Tobacco Use  . Smoking status: Never Smoker  . Smokeless tobacco: Never Used  Substance and Sexual Activity  . Alcohol use: No  . Drug use: No  . Sexual activity: Not on file  Lifestyle  . Physical activity:    Days per week: Not on file    Minutes per session: Not on file  . Stress: Not on file  Relationships  . Social connections:    Talks on phone: Not on file    Gets together: Not on file    Attends religious service: Not on file    Active member of club or organization: Not on file    Attends meetings of clubs or organizations: Not on file    Relationship status: Not on file  . Intimate partner violence:    Fear of current or ex partner: Not on file   Emotionally abused: Not on file    Physically abused: Not on file    Forced sexual activity: Not on file  Other Topics Concern  . Not on file  Social History Narrative  . Not on file   Family History  Problem Relation Age of Onset  . Diabetes Mother   . Hypertension Mother   . Arthritis Mother   . Colon cancer Father 43       d.76 metastatic at time of diagnosis  . Diabetes Father   . Heart disease Father   . Hypertension Father   . Breast cancer Maternal Grandmother 28       d.60s  . Breast cancer Maternal Aunt 52  . Cervical cancer Maternal Aunt 107  . Breast cancer Maternal Aunt 46       d.50s  . Cancer Maternal Uncle        d.62s unspecified type of cancer  . Breast cancer Cousin 56       paternal first-cousin (daughter of unaffected aunt)  . Cancer Maternal Aunt 85       "Female Cancer"  . Cancer Maternal Aunt        unknown cancer   ROS: All systems reviewed and negative except as per HPI.  Current Outpatient Medications  Medication  Sig Dispense Refill  . gabapentin (NEURONTIN) 100 MG capsule TAKE 2 CAPSULES BY MOUTH DAILY 60 capsule 1  . gabapentin (NEURONTIN) 300 MG capsule Take 1 capsule (300 mg total) by mouth at bedtime. 90 capsule 4  . ibuprofen (ADVIL,MOTRIN) 200 MG tablet Take 1-2 tablets (200-400 mg total) by mouth every 8 (eight) hours as needed.     No current facility-administered medications for this encounter.    BP 136/87   Pulse 74   Wt 252 lb 12 oz (114.6 kg)   SpO2 100%   BMI 39.59 kg/m  General: NAD Neck: No JVD, no thyromegaly or thyroid nodule.  Lungs: Clear to auscultation bilaterally with normal respiratory effort. CV: Nondisplaced PMI.  Heart regular S1/S2, no S3/S4, no murmur.  No peripheral edema.  No carotid bruit.  Normal pedal pulses.  Abdomen: Soft, nontender, no hepatosplenomegaly, no distention.  Skin: Intact without lesions or rashes.  Neurologic: Alert and oriented x 3.  Psych: Normal affect. Extremities: No clubbing or  cyanosis.  HEENT: Normal.   Assessment/Plan: 47 yo with triple negative breast cancer presents for cardio-oncology followup.  She had 4 cycles doxorubicin-based chemo ending in 9/18.  Screening echoes have shown normal LV systolic function. She had an echo done today to assess for late cardio-toxic effects of doxorubicin that I reviewed, strain and EF remain stable.  She will need no further screening echoes and can followup as needed.   Loralie Champagne 06/02/2017

## 2017-06-02 NOTE — Progress Notes (Signed)
Radiation Oncology         [phone] ________________________________  Name: [name] MRN: [id number]  Date: 06/02/2017  DOB: [birth year]  Follow-Up Visit Note  Outpatient  CC: [name], MD  [name], MD  Diagnosis and Prior Radiotherapy:    ICD-10-CM   1. Malignant neoplasm of upper-outer quadrant of left breast in female, estrogen receptor negative ([place]) C50.412    Z17.1      Cancer Staging Malignant neoplasm of upper-outer quadrant of left breast in female, estrogen receptor negative ([place]) Staging form: Breast, AJCC 8th Edition - Clinical stage from 07/01/2016: Stage IIIB (cT2, cN1(f), cM0, G3, ER: Negative, PR: Negative, HER2: Negative) - Unsigned  ClinicalStageT2-T3N1 M0, Pathologic StageypT0 ypN0LeftBreast UOQ Invasive Ductal Carcinoma, ERNegative/ PR Negative/ Her2 Negative, GradeIII     CHIEF COMPLAINT: Here for follow-up and surveillance of breast cancer  Narrative:  The patient returns today for routine follow-up. She is accompanied by her daughter today. She notes that her daughter has recently been diagnosed with brain cancer recently.         She is scheduled for a mammogram and [place] on 07/13/2017. She also has maintained follow up with her Medical Oncologist, [name].   On review of systems, she reports new area of concern to her left axilla that resembles a pimple. She has treated this area with an abx ointment. she denies any other symptoms. Pertinent positives are listed and detailed within the above HPI.                         ALLERGIES:  is allergic to carboplatin.  Meds: Current Outpatient Medications  Medication Sig Dispense Refill  . gabapentin (NEURONTIN) 100 MG capsule TAKE 2 CAPSULES BY MOUTH DAILY 60 capsule 1  . gabapentin (NEURONTIN) 300 MG capsule Take 1 capsule (300 mg total) by mouth at bedtime. 90 capsule 4  . ibuprofen (ADVIL,MOTRIN) 200 MG tablet Take 1-2 tablets (200-400 mg total) by mouth every 8 (eight)  hours as needed.     No current facility-administered medications for this encounter.     Physical Findings: The patient is in no acute distress. Patient is alert and oriented.  height is 5' 5.5" (1.664 m) and weight is 253 lb (114.8 kg). Her oral temperature is 98.2 F (36.8 C). Her blood pressure is 116/80 and her pulse is 75. Her respiration is 20 and oxygen saturation is 98%. .    Satisfactory skin healing in radiotherapy fields. Axilla: Lateral to and beneath the L axillary scar, she has a superficial raised pustular area Breast: Over her left breast, the skin still mildly hyperpigmented, but overall has healed very well.    Lab Findings: Lab Results  Component Value Date   WBC 3.0 (L) 05/28/2017   HGB 12.1 05/28/2017   HCT 35.9 05/28/2017   MCV 92.3 05/28/2017   PLT 126 (L) 05/28/2017    Radiographic Findings: No results found.  Impression/Plan: Healing well from radiotherapy to the breast tissue.  Continue skin care with topical Vitamin E Oil and / or lotion for at least 2 more months for further healing.  Apply warm compresses and neosporin to left axilla.  I encouraged her to continue with yearly mammography as appropriate (for intact breast tissue) and followup with medical oncology. I will see her back on an as-needed basis. I have encouraged her to call if she has any issues or concerns in the future. I wished her the very best.  I spent 15 minutes face to face with the patient and more than 50% of that time was spent in counseling and/or coordination of care. _____________________________________   [name], MD   This document serves as a record of services personally performed by [name], MD. It was created on her behalf by Steva Colder, a trained medical scribe. The creation of this record is based on the scribe's personal observations and the provider's statements to them. This document has been checked and approved by the attending provider.

## 2017-07-07 ENCOUNTER — Other Ambulatory Visit: Payer: Self-pay | Admitting: Oncology

## 2017-07-07 DIAGNOSIS — Z171 Estrogen receptor negative status [ER-]: Principal | ICD-10-CM

## 2017-07-07 DIAGNOSIS — C50412 Malignant neoplasm of upper-outer quadrant of left female breast: Secondary | ICD-10-CM

## 2017-07-07 MED FILL — GABAPENTIN 100 MG CAPSULE: 100 | 30 days supply | Qty: 60 | Fill #0

## 2017-07-07 MED FILL — GABAPENTIN 300 MG CAPSULE: 300 | 30 days supply | Qty: 30 | Fill #4

## 2017-07-13 ENCOUNTER — Ambulatory Visit: Payer: Medicaid Other

## 2017-07-13 ENCOUNTER — Ambulatory Visit
Admission: RE | Admit: 2017-07-13 | Discharge: 2017-07-13 | Disposition: A | Discharge status: Home / Self Care | Payer: Medicaid Other | Source: Ambulatory Visit | Attending: Oncology | Admitting: Oncology

## 2017-07-13 DIAGNOSIS — Z171 Estrogen receptor negative status [ER-]: Principal | ICD-10-CM

## 2017-07-13 DIAGNOSIS — C50412 Malignant neoplasm of upper-outer quadrant of left female breast: Secondary | ICD-10-CM

## 2017-07-16 ENCOUNTER — Telehealth: Payer: Self-pay | Admitting: Adult Health

## 2017-07-16 NOTE — Telephone Encounter (Signed)
Tried to reach regarding 11/11

## 2017-08-13 MED FILL — GABAPENTIN 100 MG CAPSULE: 100 | 30 days supply | Qty: 60 | Fill #1

## 2017-08-13 MED FILL — GABAPENTIN 300 MG CAPSULE: 300 | 30 days supply | Qty: 30 | Fill #5

## 2017-08-29 NOTE — Progress Notes (Signed)
[place]  [phone] [phone]) [phone]     ID: [name] DOB: Dec 05, 1970  MR#: [id number]  IWL#:[id number]  Patient Care Team: [name], MD as PCP - General (Internal Medicine) [name], MD as Consulting Physician (General Surgery) Freemon Binford, [name], MD as Consulting Physician (Oncology) [name], MD as Attending Physician (Radiation Oncology) OTHER MD:  CHIEF COMPLAINT: Triple negative breast cancer  CURRENT TREATMENT: Observation  INTERVAL HISTORY: [name] returns today for a follow-up and treatment of her triple negative breast cancer. She continues under observation. She continues to work. Her hair is growing back, and she is pleased with this. She is also regaining her energy.   Since her last visit, she underwent diagnostic bilateral mammography with CAD and tomography on 07/13/2017 at [facility] showing: breast density category B. There was no evidence of malignancy.    REVIEW OF SYSTEMS: [name] reports that her daughter had an intracranial germinoma, but though the tumor is gone, it affected her vision. 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 She is being followed by Dr. Mickeal Skinner. [name] has some sunburn on her face from being outside this weekend. For exercise, she takes walks around her office building (about 0.5-1 miles) about 3 times per day. Since 2018, she has lost 22 lbs, and she feels ike she is getting back to her normal self. She denies unusual headaches, visual changes, nausea, vomiting, or dizziness. There has been no unusual cough, phlegm production, or pleurisy. There has been no change in bowel or bladder habits. She denies unexplained fatigue or unexplained weight loss, bleeding, rash, or fever. A detailed review of systems was otherwise stable.    BREAST CANCER HISTORY: From the original intake note:  The patient was seen in the emergency room 05/16/2016 with nonspecific  complaints but also noting that she had a tender lump in her lateral left breast which she said she had noted the day before. Exam by the emergency room physician confirmed a 3 cm soft left lateral breast mass without overlying erythema or nipple changes. This was felt to be most consistent with fibrocystic change but the patient was referred back to her primary care physician for further evaluation. She saw the physician assistant in May 18 and then Dr. Wynetta Emery on 06/12/2016 who scheduled a bilateral diagnostic mammography with tomography and left breast ultrasonography at [facility] 06/18/2016. This found the breast density to be category B. In the left breast upper outer quadrant there was an area of asymmetry measuring up to 10.3 cm. On exam this was firm and palpable and on ultrasound there was an irregular mass measuring 2.5 cm with some subtle changes in the surrounding tissue. In the left lower axilla there were 2 suspicious-looking lymph nodes.  Biopsy of the left breast upper outer quadrant and one of the suspicious lymph nodes 06/19/2016 showed ([id number]) both to be involved by invasive ductal carcinoma, grade 3, estrogen and progesterone receptor negative, HER-2 not amplified with a signals ratio being 1.53-1.74 and the number per cell 2.35-3.05. The MIB-1 was 90%.  The patient's subsequent history is as detailed below.   PAST MEDICAL HISTORY: Past Medical History:  Diagnosis Date  . Breast cancer ([place])    lf breast  . Complication of anesthesia    DAY SURGERY 2010 OR 2011 ASPIRIATED AND STAYED OVERNIGHT  . History of radiation therapy 03/11/17- 04/21/17   Left breast, 2 Gy in 25 fractions  for a total dose of 50 Gy, Boost, 2 Gy in 5 fractions for a total dose of 10 Gy  . Hypertension    OFF MEDS  SINCE CHEMO X 5 MONTHS  . Malignant neoplasm of upper-outer quadrant of left breast in female, estrogen receptor negative ([place]) 06/23/2016    PAST SURGICAL HISTORY: Past Surgical  History:  Procedure Laterality Date  . ANKLE SURGERY    . BREAST LUMPECTOMY WITH RADIOACTIVE SEED AND SENTINEL LYMPH NODE BIOPSY Left 01/27/2017   Procedure: LEFT BREAST LUMPECTOMY WITH RADIOACTIVE SEED LOCALIZATION, LEFT AXILLARY DEEP LYMPH NODE BIOPSY WITH RADIOACTIVE SEED LOCALIZATION, LEFT AXILLARY SENTINEL LYMPH NODE MAPPING AND BIOPSY WITH BLUE DYE INJECTION;  Surgeon: [name], MD;  Location: [facility];  Service: General;  Laterality: Left;  . IR FLUORO GUIDE PORT INSERTION RIGHT  07/14/2016  . IR US GUIDE VASC ACCESS RIGHT  07/14/2016  . PORT-A-CATH REMOVAL Right 01/27/2017   Procedure: REMOVAL PORT-A-CATH;  Surgeon: [name], MD;  Location: [facility];  Service: General;  Laterality: Right;  . TUBAL LIGATION    . WISDOM TOOTH EXTRACTION      FAMILY HISTORY Family History  Problem Relation Age of Onset  . Diabetes Mother   . Hypertension Mother   . Arthritis Mother   . Colon cancer Father 75       d.76 metastatic at time of diagnosis  . Diabetes Father   . Heart disease Father   . Hypertension Father   . Breast cancer Maternal Grandmother 33       d.60s  . Breast cancer Maternal Aunt 52  . Cervical cancer Maternal Aunt 4  . Breast cancer Maternal Aunt 46       d.50s  . Cancer Maternal Uncle        d.62s unspecified type of cancer  . Breast cancer Cousin 30       paternal first-cousin (daughter of unaffected aunt)  . Cancer Maternal Aunt 12       "Female Cancer"  . Cancer Maternal Aunt        unknown cancer  The patient's father died at age 105 from colon cancer which she never had treated. The patient's mother is living, 20 years old as of June 2018. The patient has one brother, one sister. On the mother's side there is a history of breast cancer in the grandmother, age 78, and then 1 aunt with breast cancer age 5, and another with lung cancer age 14. There is no history of ovarian cancer in the family.  GYNECOLOGIC HISTORY:  No LMP recorded. (Menstrual status:  Other). Menarche age 61, first live birth age 53. She is GX P4. Her periods have never been regular. She had a period in June 2018 but not for 4 months before that. She took birth control for some years remotely with no complications. She is status post bilateral tubal ligation.  SOCIAL HISTORY:  The patient's husband owns and runs a food truck in the area. They serve mostly [place] and [place] food. 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 Her husband Kerline Trahan (goes by "[name]") is originally from [name]. The patient has two children from her first marriage. Her son [name], who is 52 years old, as of June 2018, who was recently in the [employer], but now is taking courses at [employer] and  Her daughter [name], who was studying psychology at [name] before her  recent illness. Together with [name] the patient has two other children who are [name] and hila, aged 46 and 25.     ADVANCED DIRECTIVES: Not in place   HEALTH MAINTENANCE: Social History   Tobacco Use  . Smoking status: Never Smoker  . Smokeless tobacco: Never Used  Substance Use Topics  . Alcohol use: No  . Drug use: No     Colonoscopy:Never   SWH:QPRF 2018  Bone density:Never   Allergies  Allergen Reactions  . Carboplatin Other (See Comments)    Red, blotchy, hot    Current Outpatient Medications  Medication Sig Dispense Refill  . gabapentin (NEURONTIN) 100 MG capsule TAKE 2 CAPSULES BY MOUTH DAILY 60 capsule 1  . gabapentin (NEURONTIN) 300 MG capsule Take 1 capsule (300 mg total) by mouth at bedtime. 90 capsule 4  . ibuprofen (ADVIL,MOTRIN) 200 MG tablet Take 1-2 tablets (200-400 mg total) by mouth every 8 (eight) hours as needed.     No current facility-administered medications for this visit.     OBJECTIVE: Middle-aged white woman in no acute distress  Vitals:   08/30/17 1445  BP: 93/69  Pulse: 80    Resp: 18  Temp: 98 F (36.7 C)  SpO2: 100%     Body mass index is 39.76 kg/m.   Filed Weights   08/30/17 1445  Weight: 238 lb 14.4 oz (108.4 kg)   ECOG FS:0 - Asymptomatic  Sclerae unicteric, pupils round and equal Oropharynx clear and moist No cervical or supraclavicular adenopathy Lungs no rales or rhonchi Heart regular rate and rhythm Abd soft, nontender, positive bowel sounds MSK no focal spinal tenderness, no upper extremity lymphedema Neuro: nonfocal, well oriented, appropriate affect Breasts: The right breast is unremarkable.  On the left the patient has undergone surgery and radiation, with minimal hyperpigmentation remaining.  There is no evidence of disease recurrence.  Both axillae are benign.  LAB RESULTS:  CMP     Component Value Date/Time   NA 143 08/30/2017 1410   NA 140 01/07/2017 1342   K 3.0 (LL) 08/30/2017 1410   K 3.5 01/07/2017 1342   CL 99 08/30/2017 1410   CO2 33 (H) 08/30/2017 1410   CO2 27 01/07/2017 1342   GLUCOSE 97 08/30/2017 1410   GLUCOSE 148 (H) 01/07/2017 1342   BUN 8 08/30/2017 1410   BUN 6.2 (L) 01/07/2017 1342   CREATININE 0.78 08/30/2017 1410   CREATININE 0.7 01/07/2017 1342   CALCIUM 9.7 08/30/2017 1410   CALCIUM 9.4 01/07/2017 1342   PROT 7.5 08/30/2017 1410   PROT 7.0 01/07/2017 1342   ALBUMIN 4.2 08/30/2017 1410   ALBUMIN 3.7 01/07/2017 1342   AST 32 08/30/2017 1410   AST 23 01/07/2017 1342   ALT 30 08/30/2017 1410   ALT 24 01/07/2017 1342   ALKPHOS 98 08/30/2017 1410   ALKPHOS 105 01/07/2017 1342   BILITOT 1.2 08/30/2017 1410   BILITOT 0.84 01/07/2017 1342   GFRNONAA >60 08/30/2017 1410   GFRAA >60 08/30/2017 1410    No results found for: TOTALPROTELP, ALBUMINELP, A1GS, A2GS, BETS, BETA2SER, GAMS, MSPIKE, SPEI  No results found for: KPAFRELGTCHN, LAMBDASER, KAPLAMBRATIO  Lab Results  Component Value Date   WBC 2.9 (L) 08/30/2017   NEUTROABS 1.6 08/30/2017   HGB 12.7 08/30/2017   HCT 39.1 08/30/2017   MCV  90.1 08/30/2017   PLT 160 08/30/2017      Chemistry      Component Value Date/Time   NA 143 08/30/2017 1410  NA 140 01/07/2017 1342   K 3.0 (LL) 08/30/2017 1410   K 3.5 01/07/2017 1342   CL 99 08/30/2017 1410   CO2 33 (H) 08/30/2017 1410   CO2 27 01/07/2017 1342   BUN 8 08/30/2017 1410   BUN 6.2 (L) 01/07/2017 1342   CREATININE 0.78 08/30/2017 1410   CREATININE 0.7 01/07/2017 1342      Component Value Date/Time   CALCIUM 9.7 08/30/2017 1410   CALCIUM 9.4 01/07/2017 1342   ALKPHOS 98 08/30/2017 1410   ALKPHOS 105 01/07/2017 1342   AST 32 08/30/2017 1410   AST 23 01/07/2017 1342   ALT 30 08/30/2017 1410   ALT 24 01/07/2017 1342   BILITOT 1.2 08/30/2017 1410   BILITOT 0.84 01/07/2017 1342       No results found for: LABCA2  No components found for: [id number]  No results for input(s): INR in the last 168 hours.  Urinalysis    Component Value Date/Time   COLORURINE YELLOW 10/24/2016 1621   APPEARANCEUR CLEAR 10/24/2016 1621   LABSPEC 1.025 11/23/2016 1251   PHURINE 6.0 11/23/2016 1251   PHURINE 5.5 10/24/2016 1621   GLUCOSEU Negative 11/23/2016 1251   HGBUR Negative 11/23/2016 [place] 10/24/2016 1621   BILIRUBINUR Negative 11/23/2016 1251   KETONESUR Negative 11/23/2016 [place] 10/24/2016 1621   PROTEINUR Negative 11/23/2016 1251   PROTEINUR NEGATIVE 10/24/2016 1621   UROBILINOGEN 0.2 11/23/2016 1251   NITRITE Negative 11/23/2016 1251   NITRITE NEGATIVE 10/24/2016 1621   LEUKOCYTESUR Negative 11/23/2016 1251     STUDIES: Since her last visit, she underwent diagnostic bilateral mammography with CAD and tomography on 07/13/2017 at [facility] showing: breast density category B. There was no evidence of malignancy.   ELIGIBLE FOR AVAILABLE RESEARCH PROTOCOL: no   ASSESSMENT: 47 y.o. 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 Additional testing of the patient's mother and maternal aunt is pending. Otherwise [facility]'s Common Hereditary Cancers Panel found no deleterious mutations in APC, ATM, AXIN2, BARD1, BMPR1A, BRCA1, BRCA2, BRIP1, CDH1, CDKN2A, CHEK2, CTNNA1, DICER1, EPCAM, GREM1, HOXB13, KIT, MEN1, MLH1, MSH2, MSH3, MSH6, MUTYH, NBN, NF1, NTHL1, PALB2, PDGFRA, PMS2, POLD1, POLE, PTEN, RAD50, RAD51C, RAD51D, SDHA, SDHB, SDHC, SDHD, SMAD4, SMARCA4, STK11, TP53, TSC1, TSC2, and VHL.  (2) neoadjuvant chemotherapy to consist of doxorubicin and cyclophosphamide in dose dense fashion 4 completed 09/10/2016 to be followed by paclitaxel weekly 12 given with carboplatin   (a) cycle 4 of cyclophosphamide and doxorubicin was delayed 10 days and dose decreased 10% because of febrile neutropenia after cycle 3  (b) cycle 6 of Paclitaxel and Carboplatin delayed due to neutropenia, therefore Granix added to Wednesday, Thursday, Friday following chemotherapy days  (3) status post left lumpectomy and left axillary sentinel lymph node sampling 01/27/2017 with pathology showing a complete pathologic response (ypT0 ypN0); 5 left axillary lymph nodes removed  (4) adjuvant radiation 03/11/2017-04/21/2017 Site/dose:   1. Left breast, 2 Gy in 25 fractions for a total dose of 50 Gy                      2.  Boost, 2 Gy in 5 fractions for a total dose of 10 Gy     PLAN: Sherine is now 8 months out from definitive surgery for her breast cancer with no evidence of disease recurrence.  This is favorable.  Her life has been turned upside down because of her own illness and the illness of her daughter.  Nevertheless she is doing great with her  diet and exercise program.  She is thinking long-term.  She is delighted with her 22 pound weight loss as compared to February of this year  I encouraged and commended her current habits.  She will see our 9 assistant in survivorship in November.  She will have a repeat mammogram in July and see me again next August.  She knows to call for any other issues that may develop before the next visit here.   Sahiti Joswick, [name], MD  08/30/17 3:14 PM Medical Oncology and Hematology [facility] [address] [place], [place] [address] Tel. [phone]    Fax. [phone]  Alice Rieger, am acting as scribe for Chauncey Cruel MD.  I, Lurline Del MD, have reviewed the above documentation for accuracy and completeness, and I agree with the above.

## 2017-08-30 ENCOUNTER — Inpatient Hospital Stay: Payer: Medicaid Other | Attending: Oncology | Admitting: Oncology

## 2017-08-30 ENCOUNTER — Inpatient Hospital Stay: Payer: Medicaid Other

## 2017-08-30 VITALS — BP 93/69 | HR 80 | Temp 98.0°F | Resp 18 | Ht 65.0 in | Wt 238.9 lb

## 2017-08-30 DIAGNOSIS — C50412 Malignant neoplasm of upper-outer quadrant of left female breast: Secondary | ICD-10-CM

## 2017-08-30 DIAGNOSIS — Z171 Estrogen receptor negative status [ER-]: Secondary | ICD-10-CM

## 2017-08-30 DIAGNOSIS — Z853 Personal history of malignant neoplasm of breast: Secondary | ICD-10-CM | POA: Insufficient documentation

## 2017-08-30 LAB — COMPREHENSIVE METABOLIC PANEL
ALT: 30 U/L (ref 0–44)
ANION GAP: 11 (ref 5–15)
AST: 32 U/L (ref 15–41)
Albumin: 4.2 g/dL (ref 3.5–5.0)
Alkaline Phosphatase: 98 U/L (ref 38–126)
BILIRUBIN TOTAL: 1.2 mg/dL (ref 0.3–1.2)
BUN: 8 mg/dL (ref 6–20)
CALCIUM: 9.7 mg/dL (ref 8.9–10.3)
CO2: 33 mmol/L — ABNORMAL HIGH (ref 22–32)
Chloride: 99 mmol/L (ref 98–111)
Creatinine, Ser: 0.78 mg/dL (ref 0.44–1.00)
GFR calc non Af Amer: >60 mL/min (ref >60)
Glucose, Bld: 97 mg/dL (ref 70–99)
Potassium: 3 mmol/L — CL (ref 3.5–5.1)
Sodium: 143 mmol/L (ref 135–145)
TOTAL PROTEIN: 7.5 g/dL (ref 6.5–8.1)

## 2017-08-30 LAB — CBC WITH DIFFERENTIAL (CANCER CENTER ONLY)
BASOS ABS: 0 10*3/uL (ref 0.0–0.1)
BASOS PCT: 1 %
Eosinophils Absolute: 0.1 10*3/uL (ref 0.0–0.5)
Eosinophils Relative: 2 %
HCT: 39.1 % (ref 34.8–46.6)
HEMOGLOBIN: 12.7 g/dL (ref 11.6–15.9)
Lymphocytes Relative: 34 %
Lymphs Abs: 1 10*3/uL (ref 0.9–3.3)
MCH: 29.3 pg (ref 25.1–34.0)
MCHC: 32.5 g/dL (ref 31.5–36.0)
MCV: 90.1 fL (ref 79.5–101.0)
Monocytes Absolute: 0.2 10*3/uL (ref 0.1–0.9)
Monocytes Relative: 8 %
NEUTROS ABS: 1.6 10*3/uL (ref 1.5–6.5)
NEUTROS PCT: 55 %
Platelet Count: 160 10*3/uL (ref 145–400)
RBC: 4.34 MIL/uL (ref 3.70–5.45)
RDW: 14 % (ref 11.2–14.5)
WBC: 2.9 10*3/uL — AB (ref 3.9–10.3)

## 2017-08-30 MED ORDER — GABAPENTIN 300 MG PO CAPS: 300.0000 mg | ORAL_CAPSULE | Freq: Every day | ORAL | 4 refills | Status: DC

## 2017-08-30 MED ORDER — GABAPENTIN 100 MG PO CAPS: 200.0000 mg | ORAL_CAPSULE | Freq: Every day | ORAL | 1 refills | Status: DC

## 2017-09-21 MED FILL — GABAPENTIN 300 MG CAPSULE: 300 | 30 days supply | Qty: 30 | Fill #6

## 2017-09-22 MED FILL — GABAPENTIN 100 MG CAPSULE: 100 | 30 days supply | Qty: 60 | Fill #0

## 2017-10-27 MED FILL — GABAPENTIN 100 MG CAPSULE: 100 | 30 days supply | Qty: 60 | Fill #1

## 2017-10-27 MED FILL — GABAPENTIN 300 MG CAPSULE: 300 | 30 days supply | Qty: 30 | Fill #7

## 2017-11-09 ENCOUNTER — Telehealth: Payer: Self-pay | Admitting: Adult Health

## 2017-11-09 NOTE — Telephone Encounter (Signed)
R/s appt per 11/5 sch message - pt is aware of appt

## 2017-11-15 ENCOUNTER — Encounter: Payer: Medicaid Other | Admitting: Adult Health

## 2017-11-19 ENCOUNTER — Encounter: Payer: Self-pay | Admitting: Adult Health

## 2017-11-19 ENCOUNTER — Ambulatory Visit (HOSPITAL_COMMUNITY)
Admission: RE | Admit: 2017-11-19 | Discharge: 2017-11-19 | Disposition: A | Discharge status: Home / Self Care | Payer: Medicaid Other | Source: Ambulatory Visit | Attending: Adult Health | Admitting: Adult Health

## 2017-11-19 ENCOUNTER — Telehealth: Payer: Self-pay

## 2017-11-19 ENCOUNTER — Inpatient Hospital Stay: Payer: Medicaid Other | Attending: Oncology | Admitting: Adult Health

## 2017-11-19 VITALS — BP 97/84 | HR 79 | Temp 98.3°F | Resp 18 | Ht 65.0 in | Wt 237.7 lb

## 2017-11-19 DIAGNOSIS — C50412 Malignant neoplasm of upper-outer quadrant of left female breast: Secondary | ICD-10-CM | POA: Diagnosis present

## 2017-11-19 DIAGNOSIS — Z171 Estrogen receptor negative status [ER-]: Secondary | ICD-10-CM

## 2017-11-19 DIAGNOSIS — Z853 Personal history of malignant neoplasm of breast: Secondary | ICD-10-CM | POA: Diagnosis present

## 2017-11-19 NOTE — Progress Notes (Signed)
CLINIC:  Survivorship   REASON FOR VISIT:  Routine follow-up post-treatment for a recent history of breast cancer.  BRIEF ONCOLOGIC HISTORY:    Malignant neoplasm of upper-outer quadrant of left breast in female, estrogen receptor negative ([place])   06/19/2016 Initial Diagnosis    status post left breast upper outer quadrant and left axillary lymph node biopsy, both positive for a clinical T2-T3 N1, stage IIIB-C invasive ductal carcinoma, grade 3, triple negative, with an MIB-1 of 90%    07/20/2016 - 12/21/2016 Neo-Adjuvant Chemotherapy    neoadjuvant chemotherapy to consist of doxorubicin and cyclophosphamide in dose dense fashion 4 completed 09/10/2016 to be followed by paclitaxel weekly 12 given with carboplatin              (a) cycle 4 of cyclophosphamide and doxorubicin was delayed 10 days and dose decreased 10% because of febrile neutropenia after cycle 3             (b) cycle 6 of Paclitaxel and Carboplatin delayed due to neutropenia, therefore Granix added to Wednesday, Thursday, Friday following chemotherapy days    08/17/2016 Genetic Testing    BRCA2 c.8169T>A and MSH2 c.2050G>T VUS found on the common hereditary cancer panel.  The Hereditary Gene Panel offered by [facility] includes sequencing and/or deletion duplication testing of the following 46 genes: APC, ATM, AXIN2, BARD1, BMPR1A, BRCA1, BRCA2, BRIP1, CDH1, CDKN2A (p14ARF), CDKN2A (p16INK4a), CHEK2, CTNNA1, DICER1, EPCAM (Deletion/duplication testing only), GREM1 (promoter region deletion/duplication testing only), KIT, MEN1, MLH1, MSH2, MSH3, MSH6, MUTYH, NBN, NF1, NHTL1, PALB2, PDGFRA, PMS2, POLD1, POLE, PTEN, RAD50, RAD51C, RAD51D, SDHB, SDHC, SDHD, SMAD4, SMARCA4. STK11, TP53, TSC1, TSC2, and VHL.  The following genes were evaluated for sequence changes only: SDHA and HOXB13 c.251G>A variant only.  The report date is August 17, 2016.     01/27/2017 Surgery    status post left lumpectomy and left axillary sentinel lymph node  sampling with pathology showing a complete pathologic response (ypT0 ypN0); 5 left axillary lymph nodes removed      03/11/2017 - 04/21/2017 Radiation Therapy    adjuvant radiation 03/11/2017-04/21/2017 Site/dose:1. Left breast, 2 Gy in 25 fractions for a total dose of 50 Gy 2. Boost, 2 Gy in 5 fractions for a total dose of 10 Gy      INTERVAL HISTORY:  Ms. Smylie presents to the [facility] today for our initial meeting to review her survivorship care plan detailing her treatment course for breast cancer, as well as monitoring long-term side effects of that treatment, education regarding health maintenance, screening, and overall wellness and health promotion.     Overall, Ms. Oldenburg reports feeling moderately well.  She notes that in 07/2017 she underwent diagnostic mammogram and Dr. Radford Pax noted palpable typical scarring in the far lateral left breast.  Rena says she reviewed an area in the lower inner quadrant that was concerning as well. Her mammogram showed no evidence of malignancy.She saw [name] in August, who did not note any concern on physical exam.  This area in her breast she notes is worsening.  She notes that she has a knot in the breast with a worsening skin texture change.    Sonakshi also had some pain in her hips and legs.  This has been going on for the past week and a half.  She has lost some weight, about 15 pounds since her last visit.      REVIEW OF SYSTEMS:  Review of Systems  Constitutional: Negative for appetite change, chills,  fatigue, fever and unexpected weight change.  HENT:   Negative for hearing loss, lump/mass, sore throat and trouble swallowing.   Eyes: Negative for eye problems and icterus.  Respiratory: Negative for chest tightness, cough and shortness of breath.   Cardiovascular: Negative for chest pain, leg swelling and palpitations.  Gastrointestinal: Negative for abdominal distention, abdominal pain,  constipation, diarrhea, nausea and vomiting.  Endocrine: Negative for hot flashes.  Skin: Negative for itching and rash.  Neurological: Negative for dizziness, extremity weakness, headaches and numbness.  Psychiatric/Behavioral: Negative for depression and sleep disturbance. The patient is not nervous/anxious.     ONCOLOGY TREATMENT TEAM:  1. Surgeon:  Dr. Dalbert Batman at [facility] 2. Medical Oncologist: [name]  3. Radiation Oncologist: Dr. [name]    PAST MEDICAL/SURGICAL HISTORY:  Past Medical History:  Diagnosis Date  . Breast cancer ([place])    lf breast  . Complication of anesthesia    DAY SURGERY 2010 OR 2011 ASPIRIATED AND STAYED OVERNIGHT  . History of radiation therapy 03/11/17- 04/21/17   Left breast, 2 Gy in 25 fractions for a total dose of 50 Gy, Boost, 2 Gy in 5 fractions for a total dose of 10 Gy  . Hypertension    OFF MEDS  SINCE CHEMO X 5 MONTHS  . Malignant neoplasm of upper-outer quadrant of left breast in female, estrogen receptor negative ([place]) 06/23/2016   Past Surgical History:  Procedure Laterality Date  . ANKLE SURGERY    . BREAST LUMPECTOMY WITH RADIOACTIVE SEED AND SENTINEL LYMPH NODE BIOPSY Left 01/27/2017   Procedure: LEFT BREAST LUMPECTOMY WITH RADIOACTIVE SEED LOCALIZATION, LEFT AXILLARY DEEP LYMPH NODE BIOPSY WITH RADIOACTIVE SEED LOCALIZATION, LEFT AXILLARY SENTINEL LYMPH NODE MAPPING AND BIOPSY WITH BLUE DYE INJECTION;  Surgeon: [name], MD;  Location: [facility];  Service: General;  Laterality: Left;  . IR FLUORO GUIDE PORT INSERTION RIGHT  07/14/2016  . IR US GUIDE VASC ACCESS RIGHT  07/14/2016  . PORT-A-CATH REMOVAL Right 01/27/2017   Procedure: REMOVAL PORT-A-CATH;  Surgeon: [name], MD;  Location: [facility];  Service: General;  Laterality: Right;  . TUBAL LIGATION    . WISDOM TOOTH EXTRACTION       ALLERGIES:  Allergies  Allergen Reactions  . Carboplatin Other (See Comments)    Red, blotchy, hot     CURRENT MEDICATIONS:    Outpatient Encounter Medications as of 11/19/2017  Medication Sig  . gabapentin (NEURONTIN) 100 MG capsule Take 2 capsules (200 mg total) by mouth daily.  Marland Kitchen gabapentin (NEURONTIN) 300 MG capsule Take 1 capsule (300 mg total) by mouth at bedtime.  Marland Kitchen ibuprofen (ADVIL,MOTRIN) 200 MG tablet Take 1-2 tablets (200-400 mg total) by mouth every 8 (eight) hours as needed.   No facility-administered encounter medications on file as of 11/19/2017.      ONCOLOGIC FAMILY HISTORY:  Family History  Problem Relation Age of Onset  . Diabetes Mother   . Hypertension Mother   . Arthritis Mother   . Colon cancer Father 45       d.76 metastatic at time of diagnosis  . Diabetes Father   . Heart disease Father   . Hypertension Father   . Breast cancer Maternal Grandmother 71       d.60s  . Breast cancer Maternal Aunt 52  . Cervical cancer Maternal Aunt 93  . Breast cancer Maternal Aunt 46       d.50s  . Cancer Maternal Uncle        d.62s unspecified type  of cancer  . Breast cancer Cousin 58       paternal first-cousin (daughter of unaffected aunt)  . Cancer Maternal Aunt 12       "Female Cancer"  . Cancer Maternal Aunt        unknown cancer     GENETIC COUNSELING/TESTING: See above  SOCIAL HISTORY:  Social History   Socioeconomic History  . Marital status: Married    Spouse name: Not on file  . Number of children: Not on file  . Years of education: Not on file  . Highest education level: Not on file  Occupational History  . Not on file  Social Needs  . Financial resource strain: Not on file  . Food insecurity:    Worry: Not on file    Inability: Not on file  . Transportation needs:    Medical: Not on file    Non-medical: Not on file  Tobacco Use  . Smoking status: Never Smoker  . Smokeless tobacco: Never Used  Substance and Sexual Activity  . Alcohol use: No  . Drug use: No  . Sexual activity: Not on file  Lifestyle  . Physical activity:    Days per week: Not on file     Minutes per session: Not on file  . Stress: Not on file  Relationships  . Social connections:    Talks on phone: Not on file    Gets together: Not on file    Attends religious service: Not on file    Active member of club or organization: Not on file    Attends meetings of clubs or organizations: Not on file    Relationship status: Not on file  . Intimate partner violence:    Fear of current or ex partner: Not on file    Emotionally abused: Not on file    Physically abused: Not on file    Forced sexual activity: Not on file  Other Topics Concern  . Not on file  Social History Narrative  . Not on file     PHYSICAL EXAMINATION:  Vital Signs:   Vitals:   11/19/17 1353  BP: 97/84  Pulse: 79  Resp: 18  Temp: 98.3 F (36.8 C)  SpO2: 99%   Filed Weights   11/19/17 1353  Weight: 237 lb 11.2 oz (107.8 kg)   General: Well-nourished, well-appearing female in no acute distress.  She is unaccompanied today.   HEENT: Head is normocephalic.  Pupils equal and reactive to light. Conjunctivae clear without exudate.  Sclerae anicteric. Oral mucosa is pink, moist.  Oropharynx is pink without lesions or erythema.  Lymph: No cervical, supraclavicular, or infraclavicular lymphadenopathy noted on palpation.  Cardiovascular: Regular rate and rhythm.Marland Kitchen Respiratory: Clear to auscultation bilaterally. Chest expansion symmetric; breathing non-labored.  Breasts: right breast benign, left breast s/p lumpectomy and radiation, the area in her breast feels consistent with radiation changes GI: Abdomen soft and round; non-tender, non-distended. Bowel sounds normoactive.  GU: Deferred.  Neuro: No focal deficits. Steady gait.  Psych: Mood and affect normal and appropriate for situation.  Extremities: No edema. MSK: No focal spinal tenderness to palpation.  Full range of motion in bilateral upper extremities Skin: Warm and dry.  LABORATORY DATA:  None for this visit.  DIAGNOSTIC IMAGING:  None  for this visit.      ASSESSMENT AND PLAN:  Ms.. Sonntag is a pleasant 47 y.o. 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  She presents to the Survivorship Clinic for our initial meeting and routine follow-up post-completion of treatment for breast cancer.    1. Stage IIIB left breast cancer:  Ms. Delong is continuing to recover from definitive treatment for breast cancer. She will follow-up with her medical oncologist, [name] in 08/2018 with history and physical exam per surveillance protocol. Today, a comprehensive survivorship care plan and treatment summary was reviewed with the patient today detailing her breast cancer diagnosis, treatment course, potential late/long-term effects of treatment, appropriate follow-up care with recommendations for the future, and patient education resources.  A copy of this summary, along with a letter will be sent to the patient's primary care provider via mail/fax/In Basket message after today's visit.    2. Bilateral hip pain: Will get plan films to evaluate  3. Left breast changes: These are most consistent with radiation changes, however since the patient is concerned, and notes a worsening change, will get mammogram and ultrasound to evaluate.  4. Bone health:  Given Ms. Belisle's age/history of breast cancer, she is at risk for bone demineralization.  She was given education on specific activities to promote bone health.  5. Cancer screening:  Due to Ms. Cortez's history and her age, she should receive screening for skin cancers, colon cancer, and gynecologic cancers.  The information and recommendations are listed on the patient's comprehensive care plan/treatment summary and were reviewed in detail with the patient.    6. Health maintenance and wellness promotion: Ms. Durkin was encouraged to consume  5-7 servings of fruits and vegetables per day. We reviewed the "Nutrition Rainbow" handout, as well as the handout "Take Control of Your Health and Reduce Your Cancer Risk" from the [place].  She was also encouraged to engage in moderate to vigorous exercise for 30 minutes per day most days of the week. We discussed the [employer] fitness program, which is designed for cancer survivors to help them become more physically fit after cancer treatments.  She was instructed to limit her alcohol consumption and continue to abstain from tobacco use.     7. Support services/counseling: It is not uncommon for this period of the patient's cancer care trajectory to be one of many emotions and stressors.  We discussed an opportunity for her to participate in the next session of [facility] ("Finding Your New Normal") support group series designed for patients after they have completed treatment.   Ms. Mckenna was encouraged to take advantage of our many other support services programs, support groups, and/or counseling in coping with her new life as a cancer survivor after completing anti-cancer treatment.  She was offered support today through active listening and expressive supportive counseling.  She was given information regarding our available services and encouraged to contact me with any questions or for help enrolling in any of our support group/programs.    Dispo:   -Return to cancer center in 08/2018 for f/u with [name]  -Mammogram and ultrasound ordered -Follow up with Dr. Dalbert Batman -She is welcome to return back to the Survivorship Clinic at any time; no additional follow-up needed at this time.  -Consider referral back to survivorship as a long-term survivor for continued surveillance  A total of (30) minutes of face-to-face time was spent with this patient with greater than 50% of that time in counseling and care-coordination.   Gardenia Phlegm, NP Survivorship  Program [place] [phone]   Note: PRIMARY CARE  [place], [place], [place] [phone]

## 2017-11-19 NOTE — Telephone Encounter (Signed)
Spoke to patient informing of normal xray results.  Encouraged to call center if pain get worse.  Patient voiced understanding.

## 2017-11-19 NOTE — Telephone Encounter (Signed)
-----   Message from Gardenia Phlegm, NP sent at 11/19/2017  4:02 PM EST ----- Let patient know xrays are normal.  If pain worsens, she should let us know ----- Message ----- From: Interface, Rad Results In Sent: 11/19/2017   3:45 PM EST To: Gardenia Phlegm, NP

## 2017-11-22 ENCOUNTER — Telehealth: Payer: Self-pay | Admitting: Adult Health

## 2017-11-22 NOTE — Telephone Encounter (Signed)
No 11/15 los.

## 2017-11-24 ENCOUNTER — Ambulatory Visit
Admission: RE | Admit: 2017-11-24 | Discharge: 2017-11-24 | Disposition: A | Discharge status: Home / Self Care | Payer: Medicaid Other | Source: Ambulatory Visit | Attending: Adult Health | Admitting: Adult Health

## 2017-11-24 ENCOUNTER — Telehealth: Payer: Self-pay

## 2017-11-24 ENCOUNTER — Other Ambulatory Visit: Payer: Self-pay | Admitting: Oncology

## 2017-11-24 DIAGNOSIS — Z171 Estrogen receptor negative status [ER-]: Principal | ICD-10-CM

## 2017-11-24 DIAGNOSIS — C50412 Malignant neoplasm of upper-outer quadrant of left female breast: Secondary | ICD-10-CM

## 2017-11-24 MED FILL — GABAPENTIN 300 MG CAPSULE: 300 | 30 days supply | Qty: 30 | Fill #8

## 2017-11-24 MED FILL — GABAPENTIN 100 MG CAPSULE: 100 | 30 days supply | Qty: 60 | Fill #0

## 2017-11-24 NOTE — Telephone Encounter (Signed)
Patient called with c/o continued hip and leg pain.  X rays done on 11/19/17 and were ok.  She describes pain like after she has had chemo, "it feels like my bones are exploding".  Patient takes gabapentin but no other pain meds. Offered her to see Lucianne Lei in [facility] and she agreed.  Appt 11/25/17 at 9 am.

## 2017-11-25 ENCOUNTER — Inpatient Hospital Stay (HOSPITAL_BASED_OUTPATIENT_CLINIC_OR_DEPARTMENT_OTHER): Payer: Medicaid Other | Admitting: Medical

## 2017-11-25 VITALS — BP 113/79 | HR 64 | Temp 97.8°F | Resp 17 | Ht 65.0 in | Wt 236.5 lb

## 2017-11-25 DIAGNOSIS — Z853 Personal history of malignant neoplasm of breast: Secondary | ICD-10-CM

## 2017-11-25 DIAGNOSIS — Z171 Estrogen receptor negative status [ER-]: Secondary | ICD-10-CM

## 2017-11-25 DIAGNOSIS — C50412 Malignant neoplasm of upper-outer quadrant of left female breast: Secondary | ICD-10-CM

## 2017-11-25 DIAGNOSIS — M898X9 Other specified disorders of bone, unspecified site: Secondary | ICD-10-CM

## 2017-11-25 MED ORDER — HYDROCODONE-ACETAMINOPHEN 5-325 MG PO TABS: ORAL_TABLET | ORAL | 0 refills | Status: DC

## 2017-11-25 MED FILL — HYDROCODON-APAP 5-325: 5-325 | 6 days supply | Qty: 25 | Fill #0

## 2017-11-26 NOTE — Progress Notes (Signed)
[facility] Progress Note   [name] [id number] 05-Feb-1970 47 y.o.  [name] is managed by [name]  Actively treated with chemotherapy/immunotherapy: no  Assessment: Plan:    Bone pain  Malignant neoplasm of upper-outer quadrant of left breast in female, estrogen receptor negative ([place])   Progressive bilateral hip lower extremity bony pain despite negative bilateral hip x-rays: The patient was referred for a bone scan.  The patient will be contacted with the results of her bone scan when they become available.  She was given a prescription of Vicodin to use as needed.  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 on 09/01/2018.  Please see After Visit Summary for patient specific instructions.  Future Appointments  Date Time Provider [place]  09/01/2018  2:00 PM [facility] 6 [facility] None  09/01/2018  2:30 PM Magrinat, [name], MD [facility] None    No orders of the defined types were placed in this encounter.      Subjective:   Patient ID:  [name] is a 47 y.o. (DOB January 17, 1970) female.  Chief Complaint: No chief complaint on file.   HPI [name] is a 47 year old female with a history of a stage IIIb ER negative malignant neoplasm of the left breast who has been followed by [name].  She was most recently seen in the survivorship clinic on 11/19/2017.  At that time she reported that she had had at least a 2 to 2-1/2-week history of pain in her hips with radiation down the posterior right leg.  She was referred for bilateral hip x-rays which returned negative.  The patient reports that she has been gradually increasing her activity and has been on her feet more.  She notes that her pain increases throughout the day.  She denies any trauma.  She continues to have night sweats since her chemotherapy but denies fevers or chills.  She  has been working to lose weight.  Medications: I have reviewed the patient's current medications.  Allergies:  Allergies  Allergen Reactions  . Carboplatin Other (See Comments)    Red, blotchy, hot    Past Medical History:  Diagnosis Date  . Breast cancer ([place])    lf breast  . Complication of anesthesia    DAY SURGERY 2010 OR 2011 ASPIRIATED AND STAYED OVERNIGHT  . History of radiation therapy 03/11/17- 04/21/17   Left breast, 2 Gy in 25 fractions for a total dose of 50 Gy, Boost, 2 Gy in 5 fractions for a total dose of 10 Gy  . Hypertension    OFF MEDS  SINCE CHEMO X 5 MONTHS  . Malignant neoplasm of upper-outer quadrant of left breast in female, estrogen receptor negative ([place]) 06/23/2016    Past Surgical History:  Procedure Laterality Date  . ANKLE SURGERY    . BREAST LUMPECTOMY WITH RADIOACTIVE SEED AND SENTINEL LYMPH NODE BIOPSY Left 01/27/2017   Procedure: LEFT BREAST LUMPECTOMY WITH RADIOACTIVE SEED LOCALIZATION, LEFT AXILLARY DEEP LYMPH NODE BIOPSY WITH RADIOACTIVE SEED LOCALIZATION, LEFT AXILLARY SENTINEL LYMPH NODE MAPPING AND BIOPSY WITH BLUE DYE INJECTION;  Surgeon: [name], MD;  Location: [facility];  Service: General;  Laterality: Left;  . IR FLUORO GUIDE PORT INSERTION RIGHT  07/14/2016  . IR US GUIDE VASC ACCESS RIGHT  07/14/2016  . PORT-A-CATH REMOVAL Right 01/27/2017   Procedure: REMOVAL PORT-A-CATH;  Surgeon: [name], MD;  Location: [facility];  Service: General;  Laterality: Right;  . TUBAL LIGATION    . WISDOM TOOTH EXTRACTION      Family History  Problem Relation Age of Onset  . Diabetes Mother   . Hypertension Mother   . Arthritis Mother   . Colon cancer Father 29       d.76 metastatic at time of diagnosis  . Diabetes Father   . Heart disease Father   . Hypertension Father   . Breast cancer Maternal Grandmother 24       d.60s  . Breast cancer Maternal Aunt 52  . Cervical cancer Maternal Aunt 33  . Breast cancer Maternal Aunt 46       d.50s  .  Cancer Maternal Uncle        d.62s unspecified type of cancer  . Breast cancer Cousin 72       paternal first-cousin (daughter of unaffected aunt)  . Cancer Maternal Aunt 55       "Female Cancer"  . Cancer Maternal Aunt        unknown cancer    Social History   Socioeconomic History  . Marital status: Married    Spouse name: Not on file  . Number of children: Not on file  . Years of education: Not on file  . Highest education level: Not on file  Occupational History  . Not on file  Social Needs  . Financial resource strain: Not on file  . Food insecurity:    Worry: Not on file    Inability: Not on file  . Transportation needs:    Medical: Not on file    Non-medical: Not on file  Tobacco Use  . Smoking status: Never Smoker  . Smokeless tobacco: Never Used  Substance and Sexual Activity  . Alcohol use: No  . Drug use: No  . Sexual activity: Not on file  Lifestyle  . Physical activity:    Days per week: Not on file    Minutes per session: Not on file  . Stress: Not on file  Relationships  . Social connections:    Talks on phone: Not on file    Gets together: Not on file    Attends religious service: Not on file    Active member of club or organization: Not on file    Attends meetings of clubs or organizations: Not on file    Relationship status: Not on file  . Intimate partner violence:    Fear of current or ex partner: Not on file    Emotionally abused: Not on file    Physically abused: Not on file    Forced sexual activity: Not on file  Other Topics Concern  . Not on file  Social History Narrative  . Not on file    Past Medical History, Surgical history, Social history, and Family history were reviewed and updated as appropriate.   Please see review of systems for further details on the patient's review from today.   Review of Systems:  Review of Systems  Constitutional: Positive for activity change (The patient is gradually been increasing her activity  and has been on her feet more.) and diaphoresis. Negative for appetite change, chills, fever and unexpected weight change.  Musculoskeletal: Positive for arthralgias and myalgias.       Bilateral hip pain with radiation inferiorly along the right posterior lower extremity.    Objective:   Physical Exam:  BP 113/79 (BP Location: Left Arm, Patient Position: Sitting)   Pulse  64   Temp 97.8 F (36.6 C) (Oral)   Resp 17   Ht 5\' 5"  (1.651 m)   Wt 236 lb 8 oz (107.3 kg)   SpO2 99%   BMI 39.36 kg/m  ECOG: 0  Physical Exam  Constitutional: No distress.  HENT:  Head: Normocephalic and atraumatic.  Cardiovascular: Normal rate, regular rhythm and normal heart sounds. Exam reveals no gallop and no friction rub.  No murmur heard. Pulmonary/Chest: Effort normal and breath sounds normal. No stridor. No respiratory distress. She has no wheezes. She has no rales.  Neurological: She is alert. Coordination normal.  No tenderness in the bilateral sciatic notches. Negative bilateral straight leg raises.  Skin: Skin is warm and dry. No rash noted. She is not diaphoretic. No erythema.  Psychiatric: She has a normal mood and affect. Her behavior is normal. Judgment and thought content normal.    Lab Review:     Component Value Date/Time   NA 143 08/30/2017 1410   NA 140 01/07/2017 1342   K 3.0 (LL) 08/30/2017 1410   K 3.5 01/07/2017 1342   CL 99 08/30/2017 1410   CO2 33 (H) 08/30/2017 1410   CO2 27 01/07/2017 1342   GLUCOSE 97 08/30/2017 1410   GLUCOSE 148 (H) 01/07/2017 1342   BUN 8 08/30/2017 1410   BUN 6.2 (L) 01/07/2017 1342   CREATININE 0.78 08/30/2017 1410   CREATININE 0.7 01/07/2017 1342   CALCIUM 9.7 08/30/2017 1410   CALCIUM 9.4 01/07/2017 1342   PROT 7.5 08/30/2017 1410   PROT 7.0 01/07/2017 1342   ALBUMIN 4.2 08/30/2017 1410   ALBUMIN 3.7 01/07/2017 1342   AST 32 08/30/2017 1410   AST 23 01/07/2017 1342   ALT 30 08/30/2017 1410   ALT 24 01/07/2017 1342   ALKPHOS 98  08/30/2017 1410   ALKPHOS 105 01/07/2017 1342   BILITOT 1.2 08/30/2017 1410   BILITOT 0.84 01/07/2017 1342   GFRNONAA >60 08/30/2017 1410   GFRAA >60 08/30/2017 1410       Component Value Date/Time   WBC 2.9 (L) 08/30/2017 1410   WBC 5.2 01/25/2017 1447   RBC 4.34 08/30/2017 1410   HGB 12.7 08/30/2017 1410   HGB 10.9 (L) 01/07/2017 1342   HCT 39.1 08/30/2017 1410   HCT 33.4 (L) 01/07/2017 1342   PLT 160 08/30/2017 1410   PLT 141 (L) 01/07/2017 1342   MCV 90.1 08/30/2017 1410   MCV 99.4 01/07/2017 1342   MCH 29.3 08/30/2017 1410   MCHC 32.5 08/30/2017 1410   RDW 14.0 08/30/2017 1410   RDW 16.4 (H) 01/07/2017 1342   LYMPHSABS 1.0 08/30/2017 1410   LYMPHSABS 0.9 01/07/2017 1342   MONOABS 0.2 08/30/2017 1410   MONOABS 0.4 01/07/2017 1342   EOSABS 0.1 08/30/2017 1410   EOSABS 0.0 01/07/2017 1342   BASOSABS 0.0 08/30/2017 1410   BASOSABS 0.0 01/07/2017 1342   -------------------------------  Imaging from last 24 hours (if applicable):  Radiology interpretation: US Breast Ltd Uni Left Inc Axilla  Result Date: 11/24/2017 CLINICAL DATA:  47 year old with a personal history of malignant lumpectomy of the UPPER OUTER QUADRANT of the LEFT breast in January, 2019 (after neoadjuvant chemotherapy), pathology 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 Patient has undergone adjuvant radiation therapy. The patient presents today with a palpable lump involving the INNER LEFT breast which she initially noted approximately 4 months ago, which has become more conspicuous in the past 2 weeks. She is also  questioning early LEFT nipple retraction. EXAM: DIGITAL DIAGNOSTIC LEFT MAMMOGRAM WITH CAD AND TOMO ULTRASOUND LEFT BREAST COMPARISON:  Mammography 07/13/2017 (BILATERAL), 01/27/2017 (LEFT) and earlier. Prior LEFT breast ultrasound 06/19/2016 and 06/18/2016 were performed in a different part of the breast. ACR Breast Density Category c: The  breast tissue is heterogeneously dense, which may obscure small masses. FINDINGS: Standard 2D and tomosynthesis full field CC and MLO views of the LEFT breast were obtained. Tomosynthesis and synthesized spot compression tangential view of the area of concern in the LEFT breast was also obtained. A standard 2D magnification CC view of the lumpectomy site in the LEFT breast was also obtained. In the area of palpable concern in the INNER LEFT breast is skin thickening and trabecular thickening related to post radiation changes. These findings are present diffusely throughout the LEFT breast. There is no mass or architectural distortion in the area of palpable concern. Post surgical scar/architectural distortion at the lumpectomy site in the UPPER OUTER LEFT breast at POSTERIOR depth. No findings suspicious for malignancy in the LEFT breast. Mammographic images were processed with CAD. On physical exam, there is palpable skin thickening in the INNER LEFT breast in the area of concern, though I do not palpate a discrete mass. I do not identify nipple retraction as questioned by the patient. Targeted LEFT breast ultrasound is performed, showing marked skin thickening involving the INNER breast at the 9 o'clock position approximately 4 cm from the nipple in the area of concern. The skin measures up to 7 mm in thickness. Edema and trabecular thickening is present in the underlying breast tissues. No suspicious solid mass or abnormal acoustic shadowing is identified. Because of the patient concern of early nipple retraction, the tissues behind the nipple were evaluated and have a similar edematous appearance to the tissues in the inner breast, but there is no mass or architectural distortion. IMPRESSION: 1. No mammographic or sonographic evidence of malignancy involving the LEFT breast. 2. Extensive post radiation skin thickening and trabecular thickening throughout the LEFT breast, including the MEDIAL breast in the area  of patient concern. 3. Expected post lumpectomy changes involving the UPPER OUTER LEFT breast. RECOMMENDATION: Annual BILATERAL diagnostic mammography which is due in July, 2020. I have discussed the findings and recommendations with the patient. Results were also provided in writing at the conclusion of the visit. If applicable, a reminder letter will be sent to the patient regarding the next appointment. BI-RADS CATEGORY  2: Benign. Electronically Signed   By: Evangeline Dakin M.D.   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 Patient has undergone adjuvant radiation therapy. The patient presents today with a palpable lump involving the INNER LEFT breast which she initially noted approximately 4 months ago, which has become more conspicuous in the past 2 weeks. She is also questioning early LEFT nipple retraction. EXAM: DIGITAL DIAGNOSTIC LEFT MAMMOGRAM WITH CAD AND TOMO ULTRASOUND LEFT BREAST COMPARISON:  Mammography 07/13/2017 (BILATERAL), 01/27/2017 (LEFT) and earlier. Prior LEFT breast ultrasound 06/19/2016 and 06/18/2016 were performed in a different part of the breast. ACR Breast Density Category c: The breast tissue is heterogeneously dense, which may obscure small masses. FINDINGS: Standard 2D and tomosynthesis full field CC and MLO views of  the LEFT breast were obtained. Tomosynthesis and synthesized spot compression tangential view of the area of concern in the LEFT breast was also obtained. A standard 2D magnification CC view of the lumpectomy site in the LEFT breast was also obtained. In the area of palpable concern in the INNER LEFT breast is skin thickening and trabecular thickening  related to post radiation changes. These findings are present diffusely throughout the LEFT breast. There is no mass or architectural distortion in the area of palpable concern. Post surgical scar/architectural distortion at the lumpectomy site in the UPPER OUTER LEFT breast at POSTERIOR depth. No findings suspicious for malignancy in the LEFT breast. Mammographic images were processed with CAD. On physical exam, there is palpable skin thickening in the INNER LEFT breast in the area of concern, though I do not palpate a discrete mass. I do not identify nipple retraction as questioned by the patient. Targeted LEFT breast ultrasound is performed, showing marked skin thickening involving the INNER breast at the 9 o'clock position approximately 4 cm from the nipple in the area of concern. The skin measures up to 7 mm in thickness. Edema and trabecular thickening is present in the underlying breast tissues. No suspicious solid mass or abnormal acoustic shadowing is identified. Because of the patient concern of early nipple retraction, the tissues behind the nipple were evaluated and have a similar edematous appearance to the tissues in the inner breast, but there is no mass or architectural distortion. IMPRESSION: 1. No mammographic or sonographic evidence of malignancy involving the LEFT breast. 2. Extensive post radiation skin thickening and trabecular thickening throughout the LEFT breast, including the MEDIAL breast in the area of patient concern. 3. Expected post lumpectomy changes involving the UPPER OUTER LEFT breast. RECOMMENDATION: Annual BILATERAL diagnostic mammography which is due in July, 2020. I have discussed the findings and recommendations with the patient. Results were also provided in writing at the conclusion of the visit. If applicable, a reminder letter will be sent to the patient regarding the next appointment. BI-RADS CATEGORY  2: Benign. Electronically Signed   By: Evangeline Dakin M.D.   On:  11/24/2017 11:40   Dg Hip Unilat W Or W/o Pelvis 1 View Left  Result Date: 11/19/2017 CLINICAL DATA:  47 y/o F; continued and increasing pelvic pain radiating down the right greater than left lower extremities. History of breast cancer, radiation, and chemotherapy completed 6 months ago. EXAM: DG HIP (WITH OR WITHOUT PELVIS) 1V*L*; DG HIP (WITH OR WITHOUT PELVIS) 1V RIGHT COMPARISON:  None. FINDINGS: There is no evidence of hip fracture or dislocation. There is no evidence of arthropathy or other focal bone abnormality. Hip joints, sacroiliac joints, and the symphysis pubis are well maintained. IMPRESSION: Negative. Electronically Signed   By: Kristine Garbe M.D.   On: 11/19/2017 15:42   Dg Hip Unilat W Or W/o Pelvis 1 View Right  Result Date: 11/19/2017 CLINICAL DATA:  47 y/o F; continued and increasing pelvic pain radiating down the right greater than left lower extremities. History of breast cancer, radiation, and chemotherapy completed 6 months ago. EXAM: DG HIP (WITH OR WITHOUT PELVIS) 1V*L*; DG HIP (WITH OR WITHOUT PELVIS) 1V RIGHT COMPARISON:  None. FINDINGS: There is no evidence of hip fracture or dislocation. There is no evidence of arthropathy or other focal bone abnormality. Hip joints, sacroiliac joints, and the symphysis pubis are well maintained. IMPRESSION: Negative. Electronically Signed   By: Kristine Garbe M.D.   On: 11/19/2017 15:42

## 2017-12-13 ENCOUNTER — Other Ambulatory Visit: Payer: Self-pay | Admitting: Medical

## 2017-12-14 ENCOUNTER — Other Ambulatory Visit: Payer: Self-pay | Admitting: *Deleted

## 2017-12-14 MED ORDER — HYDROCODONE-ACETAMINOPHEN 5-325 MG PO TABS: ORAL_TABLET | ORAL | 0 refills | Status: DC

## 2017-12-14 MED FILL — HYDROCODON-APAP 5-325: 5-325 | 6 days supply | Qty: 25 | Fill #0

## 2017-12-14 NOTE — Telephone Encounter (Signed)
Pt left VM stating need for refill on pain med - ordered while waiting for bone scan. Bone scan is now scheduled for 12/20/2017.  Refill will be obtained and picked up by husband.  No further needs at this time.

## 2017-12-20 ENCOUNTER — Encounter (HOSPITAL_COMMUNITY)
Admission: RE | Admit: 2017-12-20 | Discharge: 2017-12-20 | Disposition: A | Discharge status: Home / Self Care | Payer: Medicaid Other | Source: Ambulatory Visit | Attending: Medical | Admitting: Medical

## 2017-12-20 DIAGNOSIS — C50412 Malignant neoplasm of upper-outer quadrant of left female breast: Secondary | ICD-10-CM | POA: Diagnosis present

## 2017-12-20 DIAGNOSIS — M898X9 Other specified disorders of bone, unspecified site: Secondary | ICD-10-CM | POA: Diagnosis present

## 2017-12-20 DIAGNOSIS — Z171 Estrogen receptor negative status [ER-]: Secondary | ICD-10-CM | POA: Diagnosis present

## 2017-12-20 MED ORDER — TECHNETIUM TC 99M MEDRONATE IV KIT
19.4000 | PACK | Freq: Once | INTRAVENOUS | Status: AC | PRN
  Administered 2017-12-20: 19.4 via INTRAVENOUS

## 2017-12-20 NOTE — Progress Notes (Signed)
These results were called to [place] and were reviewed with her . Her were answered. She expressed understanding.

## 2018-01-25 ENCOUNTER — Other Ambulatory Visit: Payer: Self-pay | Admitting: Nurse Practitioner

## 2018-03-07 ENCOUNTER — Encounter: Payer: Self-pay | Admitting: Rehabilitation

## 2018-03-07 ENCOUNTER — Other Ambulatory Visit: Payer: Self-pay

## 2018-03-07 ENCOUNTER — Other Ambulatory Visit: Payer: Self-pay | Admitting: Oncology

## 2018-03-07 ENCOUNTER — Ambulatory Visit: Payer: Medicaid Other | Attending: General Surgery | Admitting: Rehabilitation

## 2018-03-07 DIAGNOSIS — M6281 Muscle weakness (generalized): Secondary | ICD-10-CM | POA: Insufficient documentation

## 2018-03-07 DIAGNOSIS — C50412 Malignant neoplasm of upper-outer quadrant of left female breast: Secondary | ICD-10-CM | POA: Diagnosis not present

## 2018-03-07 DIAGNOSIS — M25612 Stiffness of left shoulder, not elsewhere classified: Secondary | ICD-10-CM | POA: Insufficient documentation

## 2018-03-07 DIAGNOSIS — N644 Mastodynia: Secondary | ICD-10-CM

## 2018-03-07 DIAGNOSIS — Z171 Estrogen receptor negative status [ER-]: Secondary | ICD-10-CM | POA: Diagnosis present

## 2018-03-07 DIAGNOSIS — R293 Abnormal posture: Secondary | ICD-10-CM | POA: Insufficient documentation

## 2018-03-07 DIAGNOSIS — I890 Lymphedema, not elsewhere classified: Secondary | ICD-10-CM

## 2018-03-07 MED FILL — GABAPENTIN 100 MG CAPSULE: 100 | 30 days supply | Qty: 60 | Fill #1

## 2018-03-07 NOTE — Therapy (Signed)
[place], [place], [address] Phone: [phone]   Fax:  [phone]  Physical Therapy Evaluation  Patient Details  Name: [name] MRN: [id number] Date of Birth: Aug 31, 1970 Referring Provider (PT): Dr. Renelda Loma   Encounter Date: 03/07/2018  PT End of Session - 03/07/18 1359    Visit Number  1    Number of Visits  9   for MD cert, not medicaid visits   Date for PT Re-Evaluation  63/14/97   for MD cert; not medicaid visits   Authorization Type  medicaid ; needs PA check     PT Start Time  0845    PT Stop Time  0930    PT Time Calculation (min)  45 min    Activity Tolerance  Patient tolerated treatment well    Behavior During Therapy  [facility] for tasks assessed/performed       Past Medical History:  Diagnosis Date  . Breast cancer ([place])    lf breast  . Complication of anesthesia    DAY SURGERY 2010 OR 2011 ASPIRIATED AND STAYED OVERNIGHT  . History of radiation therapy 03/11/17- 04/21/17   Left breast, 2 Gy in 25 fractions for a total dose of 50 Gy, Boost, 2 Gy in 5 fractions for a total dose of 10 Gy  . Hypertension    OFF MEDS  SINCE CHEMO X 5 MONTHS  . Malignant neoplasm of upper-outer quadrant of left breast in female, estrogen receptor negative ([place]) 06/23/2016    Past Surgical History:  Procedure Laterality Date  . ANKLE SURGERY    . BREAST LUMPECTOMY WITH RADIOACTIVE SEED AND SENTINEL LYMPH NODE BIOPSY Left 01/27/2017   Procedure: LEFT BREAST LUMPECTOMY WITH RADIOACTIVE SEED LOCALIZATION, LEFT AXILLARY DEEP LYMPH NODE BIOPSY WITH RADIOACTIVE SEED LOCALIZATION, LEFT AXILLARY SENTINEL LYMPH NODE MAPPING AND BIOPSY WITH BLUE DYE INJECTION;  Surgeon: [name], MD;  Location: [facility];  Service: General;  Laterality: Left;  . IR FLUORO GUIDE PORT INSERTION RIGHT  07/14/2016  . IR US GUIDE VASC ACCESS RIGHT  07/14/2016  . PORT-A-CATH REMOVAL Right 01/27/2017   Procedure: REMOVAL PORT-A-CATH;   Surgeon: [name], MD;  Location: [facility];  Service: General;  Laterality: Right;  . TUBAL LIGATION    . WISDOM TOOTH EXTRACTION      There were no vitals filed for this visit.   Subjective Assessment - 03/07/18 0854    Subjective  after surgery I was okay.  Now I am having alot of swelling in the breast area.  I can't wear tight things or bras.  My shoulder is also giving me trouble now.  My ring is starting to feel tight at the end of the day.     Pertinent History  s/p Left lumpectomy on 01/27/17 by Dr. Renelda Loma with SLNB due to stage IIIb ER negative malignant neoplasm of the left breast. 0/5 lymph nodes removed positive. Neo adjuvant chemotherapy 07/20/16-12/21/16 with doxorubicin and cyclophosphamide followed by paclitaxel. Radiation completed 03/11/17-04/21/17 to the Lt breast for total of 50GY+10GY boost.     Limitations  Lifting   reach behind my back, strength, ROM   Currently in Pain?  Yes    Pain Score  4     Pain Location  Breast   and shoulder   Pain Orientation  Left    Pain Descriptors / Indicators  Tender;Aching;Tightness    Pain Onset  More than a month ago    Pain Frequency  Constant  Aggravating Factors   after a long day, wearing too much compression on the bra    Pain Relieving Factors  inthe morning          [facility] PT Assessment - 03/07/18 0001      Assessment   Medical Diagnosis  Rt breast and UE lymphedema    Referring Provider (PT)  Dr. Renelda Loma    Onset Date/Surgical Date  01/27/17    Hand Dominance  Right    Next MD Visit  6 months or as neeed    Prior Therapy  no      Precautions   Precaution Comments  lymphedema, cancer history      Restrictions   Weight Bearing Restrictions  No      Balance Screen   Has the patient fallen in the past 6 months  No    Has the patient had a decrease in activity level because of a fear of falling?   No    Is the patient reluctant to leave their home because of a fear of falling?   No      Home Environment    Living Environment  Private residence    Living Arrangements  Spouse/significant other;Children    Available Help at Discharge  Family      Prior Function   Level of Independence  Independent    Vocation  Full time employment    Vocation Requirements  owns a food truck/catering    Leisure  no exercising yet      Cognition   Overall Cognitive Status  Within Functional Limits for tasks assessed      Observation/Other Assessments   Observations  retracted darkened Lt breast    Skin Integrity  well healed incisions      Sensation   Additional Comments  reports some tricep region numbness and pain if she stops gabapentin      Posture/Postural Control   Posture/Postural Control  Postural limitations    Postural Limitations  Rounded Shoulders;Forward head      ROM / Strength   AROM / PROM / Strength  AROM      AROM   AROM Assessment Site  Shoulder    Right/Left Shoulder  Left;Right    Right Shoulder Extension  40 Degrees    Right Shoulder Flexion  179 Degrees    Right Shoulder ABduction  180 Degrees    Right Shoulder Internal Rotation  75 Degrees    Right Shoulder External Rotation  90 Degrees    Right Shoulder Horizontal ABduction  30 Degrees    Left Shoulder Extension  40 Degrees    Left Shoulder Flexion  143 Degrees    Left Shoulder ABduction  145 Degrees    Left Shoulder Internal Rotation  75 Degrees    Left Shoulder External Rotation  55 Degrees    Left Shoulder Horizontal ABduction  20 Degrees      Palpation   Palpation comment  fibrosis most of Lt breast worse inferiorly and at inciisons. pt permission for PT to assess breast tissue        LYMPHEDEMA/ONCOLOGY QUESTIONNAIRE - 03/07/18 0910      Type   Cancer Type  Left breast cancer      Surgeries   Lumpectomy Date  01/27/17    Sentinel Lymph Node Biopsy Date  01/27/17    Number Lymph Nodes Removed  5      Treatment   Active Chemotherapy Treatment  No    Past Chemotherapy  Treatment  Yes    Active Radiation  Treatment  No    Past Radiation Treatment  Yes    Current Hormone Treatment  No    Past Hormone Therapy  No      What other symptoms do you have   Are you Having Heaviness or Tightness  Yes    Are you having Pain  Yes    Is it Hard or Difficult finding clothes that fit  --   bras   Is there Decreased scar mobility  Yes      Lymphedema Stage   Stage  STAGE 1 SPONTANEOUSLY REVERSIBLE      Lymphedema Assessments   Lymphedema Assessments  Upper extremities      Right Upper Extremity Lymphedema   At Axilla   42.7 cm    15 cm Proximal to Olecranon Process  41.5 cm    10 cm Proximal to Olecranon Process  37.8 cm    Olecranon Process  30 cm    15 cm Proximal to Ulnar Styloid Process  28.9 cm    10 cm Proximal to Ulnar Styloid Process  25 cm    Just Proximal to Ulnar Styloid Process  17.7 cm    Across Hand at [employer]  21 cm    At [place] of 2nd Digit  6.5 cm    Other  --      Left Upper Extremity Lymphedema   At Axilla   44.7 cm    15 cm Proximal to Olecranon Process  42 cm    10 cm Proximal to Olecranon Process  38.6 cm    Olecranon Process  29.6 cm    15 cm Proximal to Ulnar Styloid Process  28.6 cm    10 cm Proximal to Ulnar Styloid Process  25.5 cm    Just Proximal to Ulnar Styloid Process  17.3 cm    Across Hand at [employer]  19.9 cm    At [place] of 2nd Digit  6.3 cm          Quick Dash - 03/07/18 0001    Open a tight or new jar  Mild difficulty    Do heavy household chores (wash walls, wash floors)  Mild difficulty    Carry a shopping bag or briefcase  No difficulty    Wash your back  Moderate difficulty    Use a knife to cut food  No difficulty    Recreational activities in which you take some force or impact through your arm, shoulder, or hand (golf, hammering, tennis)  Severe difficulty    During the past week, to what extent has your arm, shoulder or hand problem interfered with your normal social activities with family, friends, neighbors, or groups?   Modererately    During the past week, to what extent has your arm, shoulder or hand problem limited your work or other regular daily activities  Modererately    Arm, shoulder, or hand pain.  Moderate    Tingling (pins and needles) in your arm, shoulder, or hand  Severe    Difficulty Sleeping  Moderate difficulty    DASH Score  40.91 %        Objective measurements completed on examination: See above findings.              PT Education - 03/07/18 1359    Education Details  POC    Person(s) Educated  Patient    Methods  Explanation  Comprehension  Verbalized understanding          PT Long Term Goals - 03/07/18 1407      PT LONG TERM GOAL #1   Title  Pt will be educated on lymphedema, appropriate compression garments, and when/how to use them    Time  4    Period  Weeks    Status  New      PT LONG TERM GOAL #2   Title  Pt will be independent with self MLD for the Lt breast and upper arm    Time  4    Period  Weeks    Status  New      PT LONG TERM GOAL #3   Title  Pt will improve Lt shoulder AROM to flex: 160, abd: 160 or greater    Time  4    Period  Weeks    Status  New      PT LONG TERM GOAL #4   Title  Pt will be educated on strength ABC program and community programs for self management    Time  4    Period  Weeks    Status  New             Plan - 03/07/18 1402    Clinical Impression Statement  Pt presents today with Lt breast edema/fibrosis and pain, lateral trunk edema, and shoulder dysfunction, and upper arm increase in circumferential measurements.  Pt currently has no compression bras or garments and feels overall uncomfortable wearing her bras or tight clothing.  Is having trouble reaching, doing work in her food truck, and doing other functional tasks like her hair and cleaning.      Examination-Activity Limitations  Lift;Reach Overhead;Carry    Examination-Participation Restrictions  Cleaning;Community Activity    Stability/Clinical  Decision Making  Evolving/Moderate complexity    Clinical Decision Making  Moderate    Rehab Potential  Excellent    PT Frequency  2x / week    PT Duration  4 weeks    PT Treatment/Interventions  ADLs/Self Care Home Management;Therapeutic exercise;Patient/family education;Manual lymph drainage;Manual techniques;Passive range of motion;Taping;Compression bandaging    PT Next Visit Plan  lymphedema education and handout, Lt breast MLD starting with self education, script for compression back? how was foam?  Eventually Lt shoulder ROM and functional mobility    Recommended Other Services  compression bra, compression sleeve and gauntlet    Consulted and Agree with Plan of Care  Patient       Patient will benefit from skilled therapeutic intervention in order to improve the following deficits and impairments:  Decreased skin integrity, Decreased knowledge of precautions, Decreased knowledge of use of DME, Decreased range of motion, Decreased strength, Impaired UE functional use, Pain, Increased edema  Visit Diagnosis: Carcinoma of upper-outer quadrant of left breast in female, estrogen receptor negative ([place]) - Plan: PT plan of care cert/re-cert  Abnormal posture - Plan: PT plan of care cert/re-cert  Lymphedema, not elsewhere classified - Plan: PT plan of care cert/re-cert  Stiffness of left shoulder, not elsewhere classified - Plan: PT plan of care cert/re-cert  Breast pain - Plan: PT plan of care cert/re-cert  Muscle weakness (generalized) - Plan: PT plan of care cert/re-cert     Problem List Patient Active Problem List   Diagnosis Date Noted  . Port-A-Cath in place 11/02/2016  . Cellulitis 10/24/2016  . Family history of breast cancer 10/08/2016  . Genetic testing 09/21/2016  . Sepsis ([place]) 08/27/2016  .  Rash 08/27/2016  . Hypokalemia 08/27/2016  . Neutropenic fever ([place]) 08/26/2016  . Constipation 07/27/2016  . Malignant neoplasm of upper-outer quadrant of left breast in  female, estrogen receptor negative ([place]) 06/23/2016  . Left breast mass 06/12/2016  . Essential hypertension 06/12/2016  . Class 3 severe obesity due to excess calories without serious comorbidity with body mass index (BMI) of 40.0 to 44.9 in adult ([place]) 06/12/2016  . Low vitamin D level 06/12/2016  . Irregular menses 06/12/2016    Shan Levans, PT 03/07/2018, 2:11 PM  [place], [place], [address] Phone: [phone]   Fax:  [phone]  Name: [name] MRN: [id number] Date of Birth: June 10, 1970

## 2018-03-16 ENCOUNTER — Ambulatory Visit: Payer: Medicaid Other

## 2018-03-18 ENCOUNTER — Encounter

## 2018-03-21 MED FILL — GABAPENTIN 300 MG CAPSULE: 300 | 30 days supply | Qty: 90 | Fill #0

## 2018-03-23 ENCOUNTER — Ambulatory Visit: Payer: Medicaid Other

## 2018-03-23 ENCOUNTER — Other Ambulatory Visit: Payer: Self-pay

## 2018-03-23 DIAGNOSIS — N644 Mastodynia: Secondary | ICD-10-CM

## 2018-03-23 DIAGNOSIS — C50412 Malignant neoplasm of upper-outer quadrant of left female breast: Secondary | ICD-10-CM

## 2018-03-23 DIAGNOSIS — M25612 Stiffness of left shoulder, not elsewhere classified: Secondary | ICD-10-CM

## 2018-03-23 DIAGNOSIS — I890 Lymphedema, not elsewhere classified: Secondary | ICD-10-CM

## 2018-03-23 DIAGNOSIS — R293 Abnormal posture: Secondary | ICD-10-CM

## 2018-03-23 DIAGNOSIS — M6281 Muscle weakness (generalized): Secondary | ICD-10-CM

## 2018-03-23 DIAGNOSIS — Z171 Estrogen receptor negative status [ER-]: Principal | ICD-10-CM

## 2018-03-23 NOTE — Therapy (Signed)
[place], [place], [address] Phone: [phone]   Fax:  [phone]  Physical Therapy Treatment  Patient Details  Name: [name] MRN: [id number] Date of Birth: 09/29/1970 Referring Provider (PT): Dr. Renelda Loma   Encounter Date: 03/23/2018  PT End of Session - 03/23/18 0949    Visit Number  2    Number of Visits  9   4 for Medicaid until recert   Date for PT Re-Evaluation  12/87/86   for MD cert, not Medicaid visits   Authorization Type  medicaid ; 03/16/18-04/14/18    PT Start Time  0856   pt arrived late   PT Stop Time  0936    PT Time Calculation (min)  40 min    Activity Tolerance  Patient tolerated treatment well    Behavior During Therapy  [facility] for tasks assessed/performed       Past Medical History:  Diagnosis Date  . Breast cancer ([place])    lf breast  . Complication of anesthesia    DAY SURGERY 2010 OR 2011 ASPIRIATED AND STAYED OVERNIGHT  . History of radiation therapy 03/11/17- 04/21/17   Left breast, 2 Gy in 25 fractions for a total dose of 50 Gy, Boost, 2 Gy in 5 fractions for a total dose of 10 Gy  . Hypertension    OFF MEDS  SINCE CHEMO X 5 MONTHS  . Malignant neoplasm of upper-outer quadrant of left breast in female, estrogen receptor negative ([place]) 06/23/2016    Past Surgical History:  Procedure Laterality Date  . ANKLE SURGERY    . BREAST LUMPECTOMY WITH RADIOACTIVE SEED AND SENTINEL LYMPH NODE BIOPSY Left 01/27/2017   Procedure: LEFT BREAST LUMPECTOMY WITH RADIOACTIVE SEED LOCALIZATION, LEFT AXILLARY DEEP LYMPH NODE BIOPSY WITH RADIOACTIVE SEED LOCALIZATION, LEFT AXILLARY SENTINEL LYMPH NODE MAPPING AND BIOPSY WITH BLUE DYE INJECTION;  Surgeon: [name], MD;  Location: [facility];  Service: General;  Laterality: Left;  . IR FLUORO GUIDE PORT INSERTION RIGHT  07/14/2016  . IR US GUIDE VASC ACCESS RIGHT  07/14/2016  . PORT-A-CATH REMOVAL Right 01/27/2017   Procedure: REMOVAL  PORT-A-CATH;  Surgeon: [name], MD;  Location: [facility];  Service: General;  Laterality: Right;  . TUBAL LIGATION    . WISDOM TOOTH EXTRACTION      There were no vitals filed for this visit.  Subjective Assessment - 03/23/18 0859    Subjective  Nothing much new since eval the other day except I noticed my swelling is better when I wear my sports bra.     Pertinent History  s/p Left lumpectomy on 01/27/17 by Dr. Renelda Loma with SLNB due to stage IIIb ER negative malignant neoplasm of the left breast. 0/5 lymph nodes removed positive. Neo adjuvant chemotherapy 07/20/16-12/21/16 with doxorubicin and cyclophosphamide followed by paclitaxel. Radiation completed 03/11/17-04/21/17 to the Lt breast for total of 50GY+10GY boost.     Patient Stated Goals  control the swelling in my Lt breast and better movement in my Lt arm    Currently in Pain?  No/denies                       [facility] Adult PT Treatment/Exercise - 03/23/18 0001      Manual Therapy   Manual Therapy  Manual Lymphatic Drainage (MLD);Myofascial release;Passive ROM;Scapular mobilization    Myofascial Release  To Lt axilla at area of tightness    Scapular Mobilization  Scapular depression throughout P/ROM to decrease  posterior capsule pain pt was c/o at end ROM    Manual Lymphatic Drainage (MLD)  In Supine: Short neck, 5 diaphragmatic breaths, Rt axilla and Lt inguinal nodes, anterior inter-axillary and Lt axillo-inguinal anastomosis, then focused on Lt breast beginning to instruct pt throughout.    Passive ROM  In Supine to Lt shoulder into flexion, abduction and er to pts tolerance which is limited due to pan at end of ROM.                  PT Long Term Goals - 03/07/18 1407      PT LONG TERM GOAL #1   Title  Pt will be educated on lymphedema, appropriate compression garments, and when/how to use them    Time  4    Period  Weeks    Status  New      PT LONG TERM GOAL #2   Title  Pt will be independent with  self MLD for the Lt breast and upper arm    Time  4    Period  Weeks    Status  New      PT LONG TERM GOAL #3   Title  Pt will improve Lt shoulder AROM to flex: 160, abd: 160 or greater    Time  4    Period  Weeks    Status  New      PT LONG TERM GOAL #4   Title  Pt will be educated on strength ABC program and community programs for self management    Time  4    Period  Weeks    Status  New            Plan - 03/23/18 0954    Clinical Impression Statement  First session of manual lymph drainage to Lt breast today which pt tolerated very well. Began educating pt today during session in basics of anatomy in lymphatic system and principles of MLD.  She verbalized good understanding. Also began gentle P/ROM which she did well with though does exhibit pain at end of motion at posterior capsule. Issued script for compression bra and sleeve. Pt is going to see if she can get bra at [facility] with Medicaid, if not will have [facility] do sleeve and bra.     Rehab Potential  Excellent    PT Frequency  2x / week    PT Duration  4 weeks    PT Treatment/Interventions  ADLs/Self Care Home Management;Therapeutic exercise;Patient/family education;Manual lymph drainage;Manual techniques;Passive range of motion;Taping;Compression bandaging    PT Next Visit Plan  Cont lymphedema education and issue handout, cont Lt breast MLD and instruct pt further next having her return demo, able to get bra from [facility]? how was foam?  Eventually Lt shoulder ROM and functional mobility    Recommended Other Services  Sent demographics to [facility] for sleeve (and potentially bra if she can't get from [facility] due to insurance)    Oncologist with Plan of Care  Patient       Patient will benefit from skilled therapeutic intervention in order to improve the following deficits and impairments:  Decreased skin integrity, Decreased knowledge of precautions, Decreased knowledge of use of DME,  Decreased range of motion, Decreased strength, Impaired UE functional use, Pain, Increased edema  Visit Diagnosis: Carcinoma of upper-outer quadrant of left breast in female, estrogen receptor negative ([facility])  Abnormal posture  Lymphedema, not elsewhere classified  Stiffness of  left shoulder, not elsewhere classified  Breast pain  Muscle weakness (generalized)     Problem List Patient Active Problem List   Diagnosis Date Noted  . Port-A-Cath in place 11/02/2016  . Cellulitis 10/24/2016  . Family history of breast cancer 10/08/2016  . Genetic testing 09/21/2016  . Sepsis ([place]) 08/27/2016  . Rash 08/27/2016  . Hypokalemia 08/27/2016  . Neutropenic fever ([place]) 08/26/2016  . Constipation 07/27/2016  . Malignant neoplasm of upper-outer quadrant of left breast in female, estrogen receptor negative ([place]) 06/23/2016  . Left breast mass 06/12/2016  . Essential hypertension 06/12/2016  . Class 3 severe obesity due to excess calories without serious comorbidity with body mass index (BMI) of 40.0 to 44.9 in adult ([place]) 06/12/2016  . Low vitamin D level 06/12/2016  . Irregular menses 06/12/2016    Otelia Limes, PTA 03/23/2018, 10:25 AM  [place], [place], [address] Phone: [phone]   Fax:  [phone]  Name: [name] MRN: [id number] Date of Birth: 26-Aug-1970

## 2018-03-24 ENCOUNTER — Other Ambulatory Visit: Payer: Self-pay

## 2018-03-24 ENCOUNTER — Ambulatory Visit: Payer: Medicaid Other

## 2018-03-24 DIAGNOSIS — R293 Abnormal posture: Secondary | ICD-10-CM

## 2018-03-24 DIAGNOSIS — C50412 Malignant neoplasm of upper-outer quadrant of left female breast: Secondary | ICD-10-CM

## 2018-03-24 DIAGNOSIS — Z171 Estrogen receptor negative status [ER-]: Principal | ICD-10-CM

## 2018-03-24 DIAGNOSIS — M25612 Stiffness of left shoulder, not elsewhere classified: Secondary | ICD-10-CM

## 2018-03-24 DIAGNOSIS — N644 Mastodynia: Secondary | ICD-10-CM

## 2018-03-24 DIAGNOSIS — I890 Lymphedema, not elsewhere classified: Secondary | ICD-10-CM

## 2018-03-24 DIAGNOSIS — M6281 Muscle weakness (generalized): Secondary | ICD-10-CM

## 2018-03-24 NOTE — Therapy (Addendum)
[place], [place], [address] Phone: [phone]   Fax:  [phone]  Physical Therapy Treatment  Patient Details  Name: [name] MRN: [id number] Date of Birth: 07-Dec-1970 Referring Provider (PT): Dr. Renelda Loma   Encounter Date: 03/24/2018  PT End of Session - 03/24/18 1158    Visit Number  3    Number of Visits  9   4 until Medicaid recert   Date for PT Re-Evaluation  60/73/71   for MD cert, not Medicaid visits   Authorization Type  medicaid ; 03/16/18-04/14/18    PT Start Time  1105    PT Stop Time  1152    PT Time Calculation (min)  47 min    Activity Tolerance  Patient tolerated treatment well    Behavior During Therapy  [facility] for tasks assessed/performed       Past Medical History:  Diagnosis Date  . Breast cancer ([place])    lf breast  . Complication of anesthesia    DAY SURGERY 2010 OR 2011 ASPIRIATED AND STAYED OVERNIGHT  . History of radiation therapy 03/11/17- 04/21/17   Left breast, 2 Gy in 25 fractions for a total dose of 50 Gy, Boost, 2 Gy in 5 fractions for a total dose of 10 Gy  . Hypertension    OFF MEDS  SINCE CHEMO X 5 MONTHS  . Malignant neoplasm of upper-outer quadrant of left breast in female, estrogen receptor negative ([place]) 06/23/2016    Past Surgical History:  Procedure Laterality Date  . ANKLE SURGERY    . BREAST LUMPECTOMY WITH RADIOACTIVE SEED AND SENTINEL LYMPH NODE BIOPSY Left 01/27/2017   Procedure: LEFT BREAST LUMPECTOMY WITH RADIOACTIVE SEED LOCALIZATION, LEFT AXILLARY DEEP LYMPH NODE BIOPSY WITH RADIOACTIVE SEED LOCALIZATION, LEFT AXILLARY SENTINEL LYMPH NODE MAPPING AND BIOPSY WITH BLUE DYE INJECTION;  Surgeon: [name], MD;  Location: [facility];  Service: General;  Laterality: Left;  . IR FLUORO GUIDE PORT INSERTION RIGHT  07/14/2016  . IR US GUIDE VASC ACCESS RIGHT  07/14/2016  . PORT-A-CATH REMOVAL Right 01/27/2017   Procedure: REMOVAL PORT-A-CATH;  Surgeon:  [name], MD;  Location: [facility];  Service: General;  Laterality: Right;  . TUBAL LIGATION    . WISDOM TOOTH EXTRACTION      There were no vitals filed for this visit.  Subjective Assessment - 03/24/18 1109    Subjective  Ready to learn the self MLD. My breast felt better after yesterday, it wasn't as heavy.    Pertinent History  s/p Left lumpectomy on 01/27/17 by Dr. Renelda Loma with SLNB due to stage IIIb ER negative malignant neoplasm of the left breast. 0/5 lymph nodes removed positive. Neo adjuvant chemotherapy 07/20/16-12/21/16 with doxorubicin and cyclophosphamide followed by paclitaxel. Radiation completed 03/11/17-04/21/17 to the Lt breast for total of 50GY+10GY boost.     Patient Stated Goals  control the swelling in my Lt breast and better movement in my Lt arm    Currently in Pain?  No/denies                       [facility] Adult PT Treatment/Exercise - 03/24/18 0001      Shoulder Exercises: Supine   External Rotation  AAROM;Left;5 reps   5 sec holds with dowel   Flexion  AAROM;Both;5 reps   5 sec holds   ABduction  AAROM;Left;5 reps   5 sec holds with dowel   Other Supine Exercises  Scapular series with  yellow theraband x4-5 each, but pt was instructed to hold off on these for about 1 week until her ROM improves      Manual Therapy   Manual Therapy  Manual Lymphatic Drainage (MLD);Passive ROM;Scapular mobilization    Scapular Mobilization  Scapular depression throughout P/ROM to decrease posterior capsule pain pt was c/o at end ROM, though this was greatly improved today    Manual Lymphatic Drainage (MLD)  In Supine: Short neck, 5 diaphragmatic breaths, Rt axilla and Lt inguinal nodes, anterior inter-axillary and Lt axillo-inguinal anastomosis, then focused on Lt breast instructing pt throughout and having her return demo of each step.     Passive ROM  In Supine to Lt shoulder into flexion, abduction and D2, pt seemed to have less pain at end of ROM today.              PT Education - 03/24/18 1145    Education Details  Self MLD, supine dowel exercises and supine scapular series though pt was instructed to wait about a week until ROM improves to begin this    Person(s) Educated  Patient    Methods  Explanation;Demonstration;Handout    Comprehension  Verbalized understanding;Returned demonstration;Need further instruction          PT Long Term Goals - 03/07/18 1407      PT LONG TERM GOAL #1   Title  Pt will be educated on lymphedema, appropriate compression garments, and when/how to use them    Time  4    Period  Weeks    Status  New      PT LONG TERM GOAL #2   Title  Pt will be independent with self MLD for the Lt breast and upper arm    Time  4    Period  Weeks    Status  New      PT LONG TERM GOAL #3   Title  Pt will improve Lt shoulder AROM to flex: 160, abd: 160 or greater    Time  4    Period  Weeks    Status  New      PT LONG TERM GOAL #4   Title  Pt will be educated on strength ABC program and community programs for self management    Time  4    Period  Weeks    Status  New            Plan - 03/24/18 1200    Clinical Impression Statement  Pt was able to return next day for instruction of self MLD and HEP as our clinic will be closed at least next 2 weeks due to [place]. Pt did excellent with learning and returning demo of self manual lymph drainage, supine dowel exercises and supine scapular series, though she will hold off on this for about 1 week until ROM improves. Pt verbalized understanding all.     Rehab Potential  Excellent    PT Frequency  2x / week    PT Duration  4 weeks    PT Treatment/Interventions  ADLs/Self Care Home Management;Therapeutic exercise;Patient/family education;Manual lymph drainage;Manual techniques;Passive range of motion;Taping;Compression bandaging    PT Next Visit Plan  Cont lymphedema education and issue handout, cont and review Lt breast MLD assessing her technique, able to get  bra from [place] [facility] [place]? (she already tried calling but no answer), also cont Lt shoulder ROM and reassess functional mobility as pt will have been doing HEP for at least 2 weeks  Consulted and Agree with Plan of Care  Patient       Patient will benefit from skilled therapeutic intervention in order to improve the following deficits and impairments:  Decreased skin integrity, Decreased knowledge of precautions, Decreased knowledge of use of DME, Decreased range of motion, Decreased strength, Impaired UE functional use, Pain, Increased edema  Visit Diagnosis: Carcinoma of upper-outer quadrant of left breast in female, estrogen receptor negative ([facility])  Abnormal posture  Lymphedema, not elsewhere classified  Stiffness of left shoulder, not elsewhere classified  Breast pain  Muscle weakness (generalized)     Problem List Patient Active Problem List   Diagnosis Date Noted  . Port-A-Cath in place 11/02/2016  . Cellulitis 10/24/2016  . Family history of breast cancer 10/08/2016  . Genetic testing 09/21/2016  . Sepsis ([place]) 08/27/2016  . Rash 08/27/2016  . Hypokalemia 08/27/2016  . Neutropenic fever ([place]) 08/26/2016  . Constipation 07/27/2016  . Malignant neoplasm of upper-outer quadrant of left breast in female, estrogen receptor negative ([place]) 06/23/2016  . Left breast mass 06/12/2016  . Essential hypertension 06/12/2016  . Class 3 severe obesity due to excess calories without serious comorbidity with body mass index (BMI) of 40.0 to 44.9 in adult ([place]) 06/12/2016  . Low vitamin D level 06/12/2016  . Irregular menses 06/12/2016    Otelia Limes, PTA 03/24/2018, 12:04 PM  [place], [place], [address] Phone: [phone]   Fax:  [phone]  Name: INTISAR CLAUDIO MRN: [id number] Date of Birth: [birth year]  PHYSICAL THERAPY DISCHARGE SUMMARY  Visits from Start of Care:  3  Current functional level related to goals / functional outcomes: Pt did not return after last visit with clinic closure due to COVID   Remaining deficits: unknown   Education / Equipment: Self MLD, HEP Plan: Patient agrees to discharge.  Patient goals were partially met. Patient is being discharged due to not returning since the last visit.  ?????    Shan Levans, PT

## 2018-03-24 NOTE — Patient Instructions (Addendum)
Self manual lymph drainage: Perform this sequence once a day.  Only give enough pressure no your skin to make the skin move. Start with circles near neck above collarbones, 10 times each side.  Diaphragmatic - Supine   Inhale through nose making navel move out toward hands. Exhale through puckered lips, hands follow navel in. Repeat _5__ times. Rest _10__ seconds between repeats.  Axilla - One at a Time   Using full weight of flat hand and fingers at center of uninvolved armpit, make _10__ in-place circles.   Copyright  VHI. All rights reserved.  LEG: Inguinal Nodes Stimulation   With small finger side of hand against hip crease on involved side, gently perform circles at the crease. Repeat __10_ times.   Copyright  VHI. All rights reserved.  1) Axilla to Inguinal Nodes - Sweep   On involved side, sweep _4__ times from armpit along side of trunk to hip crease.  Now gently stretch skin from the involved side to the uninvolved side across the chest at the shoulder line.  Repeat that 4 times.  Draw an imaginary diagonal line from upper outer breast through the nipple area toward lower inner breast.  Direct fluid upward and inward from this line toward the pathway across your upper chest .  Do this in three rows to treat all of the upper inner breast tissue, and do each row 3-4x.      Direct fluid to treat all of lower outer breast tissue downward and outward toward  pathway that is aimed at the left groin.  Finish by doing the pathways as described above going from your involved armpit to the same side groin and going across your upper chest from the involved shoulder to the uninvolved shoulder.  Repeat the steps above where you do circles in your left groin and right armpit.  SHOULDER: Flexion - Supine (Cane)        Cancer Rehab [phone]    Hold cane in both hands. Raise arms up overhead. Do not allow back to arch. Hold _5__ seconds. Do __5-10__ times; __1-2__ times a day.   SELF ASSISTED WITH OBJECT: Shoulder Abduction / Adduction - Supine    Hold cane with both hands. Move both arms from side to side, keep elbows straight.  Hold when stretch felt for __5__ seconds. Repeat __5-10__ times; __1-2__ times a day. Once this becomes easier progress to third picture bringing affected arm towards ear by staying out to side. Same hold for _5_seconds. Repeat  _5-10_ times, _1-2_ times/day.  Shoulder Blade Stretch    Clasp fingers behind head with elbows touching in front of face. Pull elbows back while pressing shoulder blades together. Relax and hold as tolerated, can place pillow under elbow here for comfort as needed and to allow for prolonged stretch.  Repeat __5__ times. Do __1-2__ sessions per day.     SHOULDER: External Rotation - Supine (Cane)    Hold cane with both hands. Rotate arm away from body. Keep elbow on floor and next to body. _5-10__ reps per set, hold 5 seconds, _1-2__ sets per day. Add towel to keep elbow at side.   Over Head Pull: Narrow and Wide Grip   Cancer Rehab [phone]   On back, knees bent, feet flat, band across thighs, elbows straight but relaxed. Pull hands apart (start). Keeping elbows straight, bring arms up and over head, hands toward floor. Keep pull steady on band. Hold momentarily. Return slowly, keeping pull steady, back to start. Then  do same with a wider grip on the band (past shoulder width) Repeat _5-10__ times. Band color __yellow____   Side Pull: Double Arm   On back, knees bent, feet flat. Arms perpendicular to body, shoulder level, elbows straight but relaxed. Pull arms out to sides, elbows straight. Resistance band comes across collarbones, hands toward floor. Hold momentarily. Slowly return to starting position. Repeat _5-10__ times. Band color _yellow____   Sword   On back, knees bent, feet flat, left hand on left hip, right hand above left. Pull right arm DIAGONALLY (hip to shoulder) across chest. Bring  right arm along head toward floor. Hold momentarily. Slowly return to starting position. Repeat _5-10__ times. Do with left arm. Band color _yellow_____   Shoulder Rotation: Double Arm   On back, knees bent, feet flat, elbows tucked at sides, bent 90, hands palms up. Pull hands apart and down toward floor, keeping elbows near sides. Hold momentarily. Slowly return to starting position. Repeat _5-10__ times. Band color __yellow____

## 2018-03-30 ENCOUNTER — Encounter: Payer: Medicaid Other | Admitting: Rehabilitation

## 2018-04-06 ENCOUNTER — Ambulatory Visit: Payer: Medicaid Other | Admitting: Rehabilitation

## 2018-05-11 ENCOUNTER — Other Ambulatory Visit: Payer: Self-pay | Admitting: Oncology

## 2018-05-11 DIAGNOSIS — Z171 Estrogen receptor negative status [ER-]: Principal | ICD-10-CM

## 2018-05-11 DIAGNOSIS — C50412 Malignant neoplasm of upper-outer quadrant of left female breast: Secondary | ICD-10-CM

## 2018-05-11 MED FILL — GABAPENTIN 100 MG CAPSULE: 100 | 30 days supply | Qty: 60 | Fill #0

## 2018-05-17 ENCOUNTER — Other Ambulatory Visit: Payer: Self-pay | Admitting: *Deleted

## 2018-06-15 MED FILL — GABAPENTIN 100 MG CAPSULE: 100 | 30 days supply | Qty: 60 | Fill #1

## 2018-06-15 MED FILL — GABAPENTIN 300 MG CAPSULE: 300 | 30 days supply | Qty: 90 | Fill #1

## 2018-06-23 ENCOUNTER — Other Ambulatory Visit: Payer: Self-pay | Admitting: General Surgery

## 2018-06-23 DIAGNOSIS — Z853 Personal history of malignant neoplasm of breast: Secondary | ICD-10-CM

## 2018-07-14 ENCOUNTER — Ambulatory Visit
Admission: RE | Admit: 2018-07-14 | Discharge: 2018-07-14 | Disposition: A | Discharge status: Home / Self Care | Payer: Medicaid Other | Source: Ambulatory Visit | Attending: General Surgery | Admitting: General Surgery

## 2018-07-14 ENCOUNTER — Other Ambulatory Visit: Payer: Self-pay

## 2018-07-14 DIAGNOSIS — Z853 Personal history of malignant neoplasm of breast: Secondary | ICD-10-CM

## 2018-08-06 IMAGING — MR MR BILATERAL BREAST WITHOUT AND WITH CONTRAST
4 of 12 series · 17 of 48 positions shown · IV contrast (Yes)
Comparison: Previous exam(s).

CLINICAL DATA: Newly diagnosed left breast cancer with metastatic
lymph node.

LABS:  GFR greater than 60
EXAM:
BILATERAL BREAST MRI WITH AND WITHOUT CONTRAST
TECHNIQUE: Multiplanar, multisequence MR images of both breasts were obtained
prior to and following the intravenous administration of 20 ml of
MultiHance.

[Series 4: ax ir · axial · 3.0mm · 0.74mm/px · 1 of 61 slices shown]
[im 1/61]
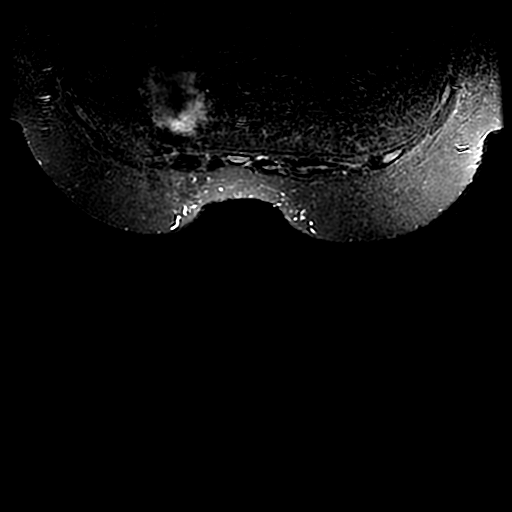

[Series 600: vibrant mph +c · axial · 1.8mm · 0.74mm/px · z∈[-63,+134]mm · 6 of 220 slices shown]
[im 1/220]
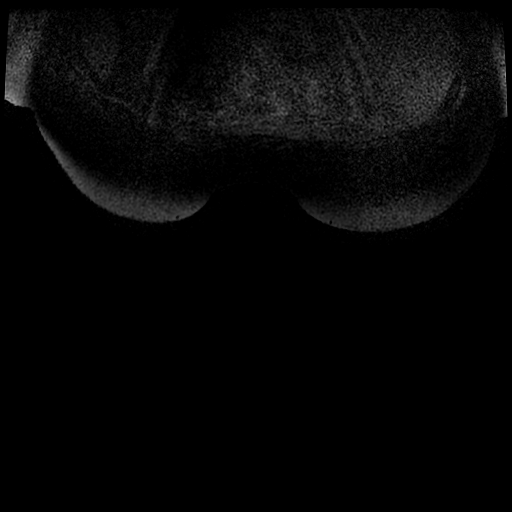
[im 44/220]
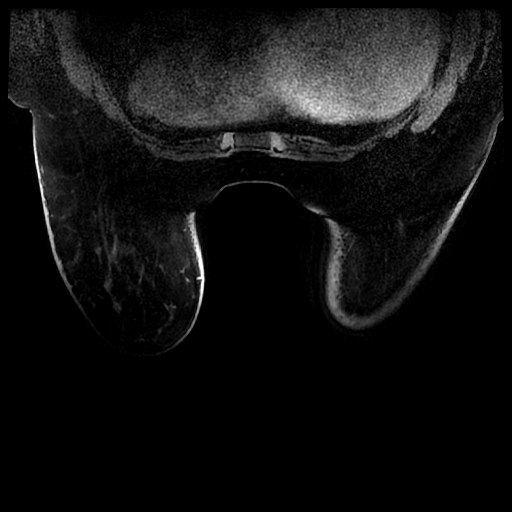
[im 88/220]
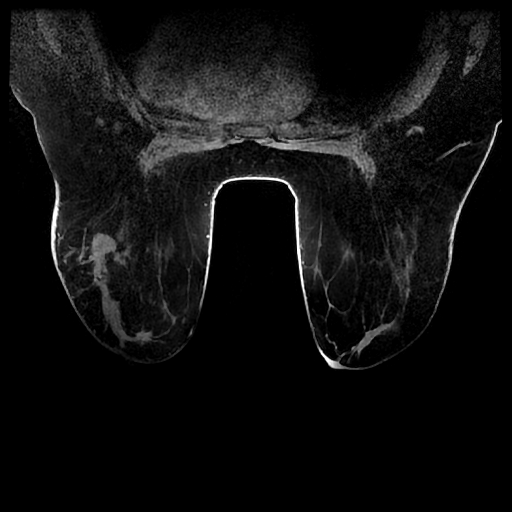
[im 132/220]
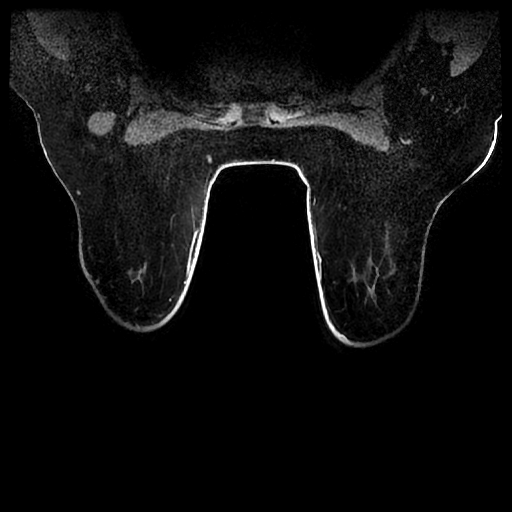
[im 176/220]
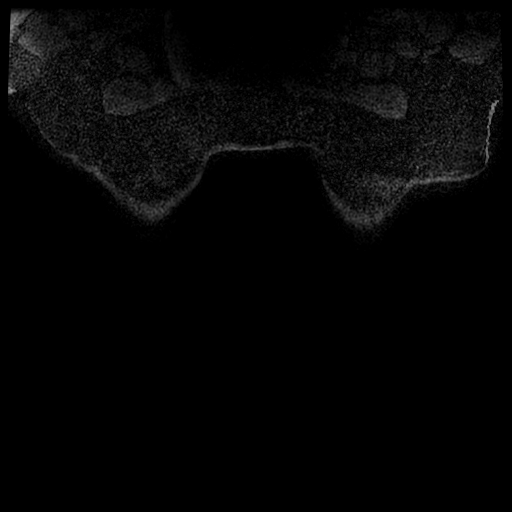
[im 220/220]
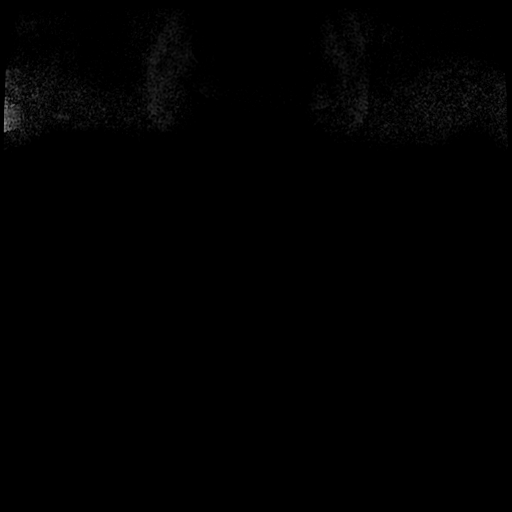

[Series 601: ph1/ vibrant mph · axial · 1.8mm · 0.74mm/px · z∈[-63,+134]mm · 6 of 220 slices shown]
[im 1/220]
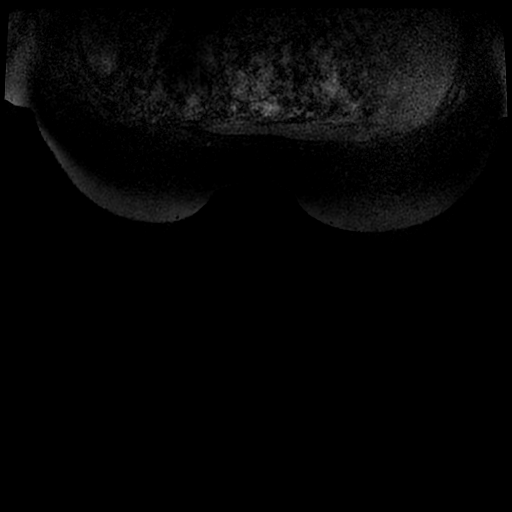
[im 44/220]
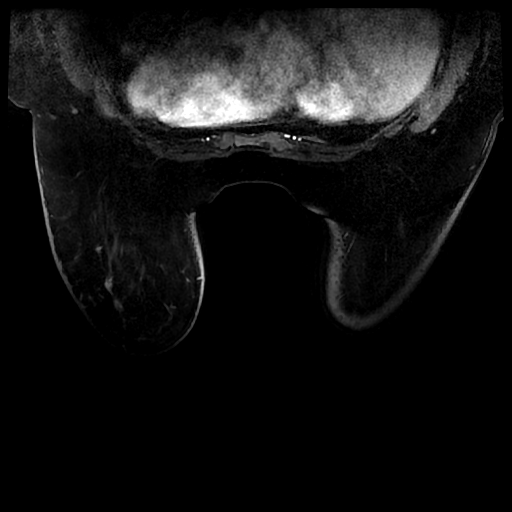
[im 88/220]
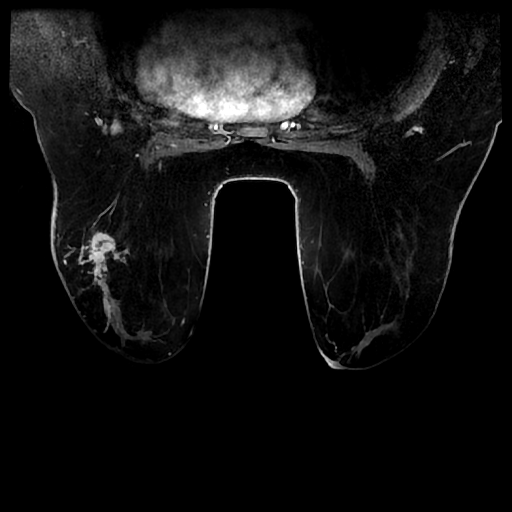
[im 132/220]
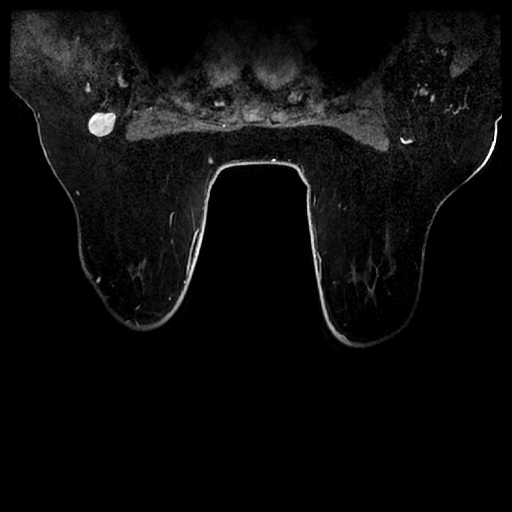
[im 176/220]
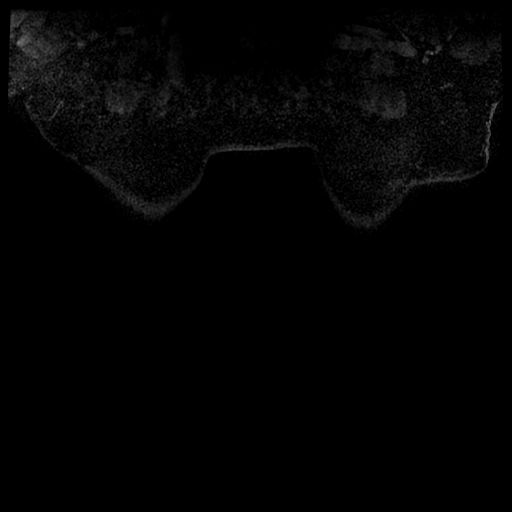
[im 220/220]
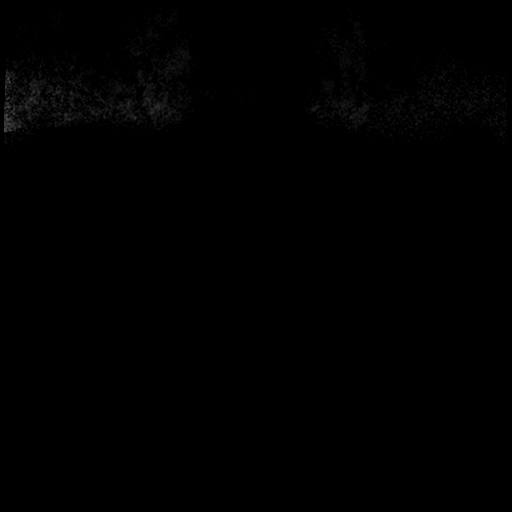

[Series 602: ph2/ vibrant mph · axial · 1.8mm · 0.74mm/px · z∈[-63,+134]mm · 4 of 220 slices shown]
[im 1/220]
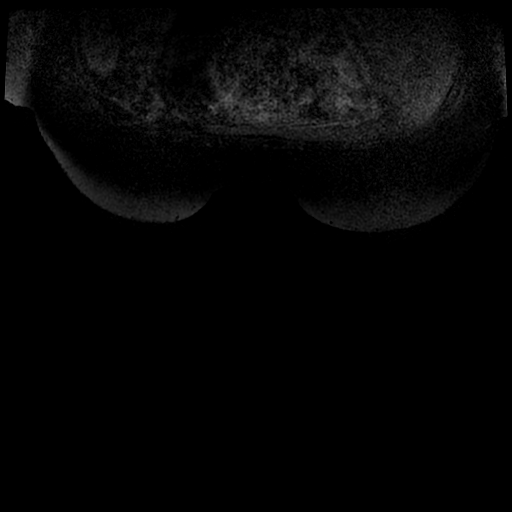
[im 44/220]
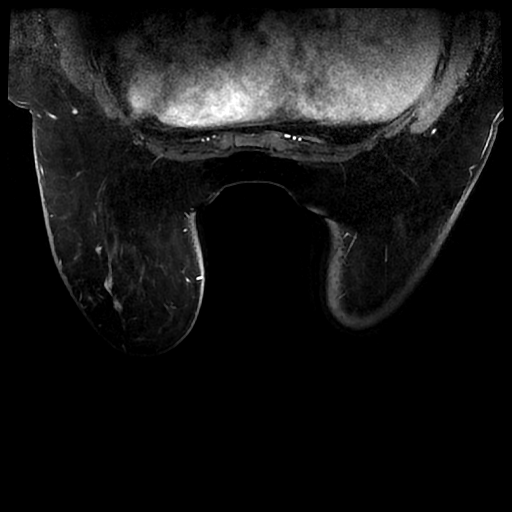
[im 132/220]
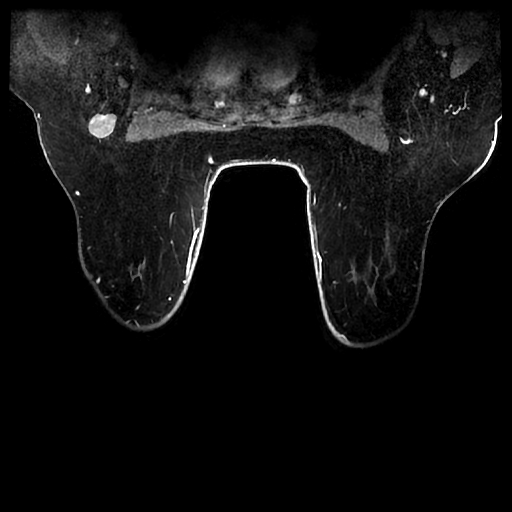
[im 220/220]
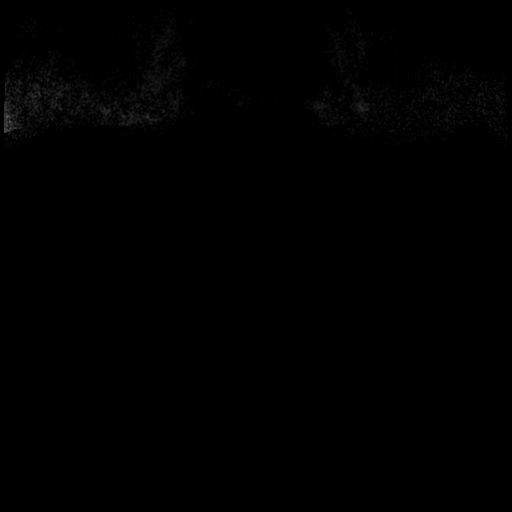

[17 of 48 positions shown; findings below may reference images not displayed]

THREE-DIMENSIONAL MR IMAGE RENDERING ON INDEPENDENT WORKSTATION:

Three-dimensional MR images were rendered by post-processing of the
original MR data on an independent workstation. The
three-dimensional MR images were interpreted, and findings are
reported in the following complete MRI report for this study. Three
dimensional images were evaluated at the independent [employer]
workstation
FINDINGS: Breast composition: b. Scattered fibroglandular tissue.

Background parenchymal enhancement: Moderate.

Right breast: No mass or abnormal enhancement.

Left breast: Irregular enhancing mass within the upper-outer
quadrant of the left breast, at middle depth, measuring 2.5 x 2 x
1.6 cm (transverse by AP by craniocaudal dimensions) with biopsy
clip artifact, consistent with biopsy proven carcinoma.

Additional extensive non-mass enhancement extending anterior,
superior and inferior from the biopsy-proven carcinoma. This
extensive non-mass enhancement within the outer left breast extends
from posterior to anterior/subareolar depth, measuring at least 10
cm AP dimension and 7 cm craniocaudal dimension, almost certainly
indicating multifocal and multicentric disease.

Lymph nodes: 3 enlarged/morphologically abnormal lymph nodes are
identified in the left axilla, 1 of which corresponds to the
biopsy-proven lymph node metastasis.

No enlarged or morphologically abnormal lymph nodes are identified
in the right axilla or within the internal mammary chain regions.

Ancillary findings:  None.
IMPRESSION: 1. Biopsy-proven carcinoma within the upper-outer quadrant of the
left breast, at middle depth, measuring 2.5 cm, with associated
biopsy clip artifact.
2. Extensive highly suspicious non-mass enhancement within the outer
left breast, involving the upper outer quadrant and lower outer
quadrant, extending from posterior depth to anterior/subareolar
depth, measuring over 10 cm AP extent and 7 cm craniocaudal
dimension, almost certainly indicating multicentric disease and
corresponding to the extent of asymmetry seen on initial diagnostic
mammogram of 06/18/2016.
3. Three enlarged/morphologically abnormal lymph nodes in the left
axilla, 1 of which corresponds to the biopsy-proven lymph node
metastasis. No additional lymphadenopathy in the right axilla or
internal mammary chain regions.
4. No evidence of malignancy within the right breast.

RECOMMENDATION:
1. Per current treatment plan for patient's known left breast
cancer.
2. If breast conservation therapy for the left breast was
considered, MRI-guided biopsies would be needed for the most
anterior and superior aspects of the extensive non-mass enhancement
in the outer left breast to confirm extent of the presumed
multicentric disease.

BI-RADS CATEGORY  6: Known biopsy-proven malignancy.

## 2018-08-09 ENCOUNTER — Other Ambulatory Visit: Payer: Self-pay | Admitting: Oncology

## 2018-08-09 DIAGNOSIS — Z171 Estrogen receptor negative status [ER-]: Secondary | ICD-10-CM

## 2018-08-09 DIAGNOSIS — C50412 Malignant neoplasm of upper-outer quadrant of left female breast: Secondary | ICD-10-CM

## 2018-08-09 MED FILL — GABAPENTIN 100 MG CAPSULE: 100 | 30 days supply | Qty: 60 | Fill #0

## 2018-08-10 ENCOUNTER — Other Ambulatory Visit: Payer: Self-pay

## 2018-08-10 MED ORDER — GABAPENTIN 300 MG PO CAPS: 600.0000 mg | ORAL_CAPSULE | Freq: Every day | ORAL | 4 refills | Status: DC

## 2018-08-10 MED FILL — GABAPENTIN 300 MG CAPSULE: 300 | 45 days supply | Qty: 90 | Fill #0

## 2018-08-31 ENCOUNTER — Other Ambulatory Visit: Payer: Self-pay | Admitting: *Deleted

## 2018-08-31 DIAGNOSIS — C50412 Malignant neoplasm of upper-outer quadrant of left female breast: Secondary | ICD-10-CM

## 2018-08-31 NOTE — Progress Notes (Signed)
No show

## 2018-09-01 ENCOUNTER — Inpatient Hospital Stay: Payer: Medicaid Other | Attending: Oncology

## 2018-09-01 ENCOUNTER — Inpatient Hospital Stay (HOSPITAL_BASED_OUTPATIENT_CLINIC_OR_DEPARTMENT_OTHER): Payer: Medicaid Other | Admitting: Oncology

## 2018-09-01 ENCOUNTER — Encounter: Payer: Self-pay | Admitting: Oncology

## 2018-09-01 DIAGNOSIS — Z171 Estrogen receptor negative status [ER-]: Secondary | ICD-10-CM

## 2018-09-01 DIAGNOSIS — C50412 Malignant neoplasm of upper-outer quadrant of left female breast: Secondary | ICD-10-CM

## 2018-09-07 ENCOUNTER — Telehealth: Payer: Self-pay | Admitting: *Deleted

## 2018-09-07 NOTE — Telephone Encounter (Signed)
!!  PLEASE OFFER FLU AND TETANUS VACCINE AND SCHEDULE A NURSE/CPP VISIT!!

## 2018-10-12 NOTE — Progress Notes (Signed)
[facility]  Telephone:([phone]) [phone] Fax:(336) [phone]     ID: [name] DOB: [birth year]  MR#: [id number]  RVU#:[id number]  Patient Care Team: [name], MD as PCP - General (Internal Medicine) [name], MD as Consulting Physician (General Surgery) , [name], MD as Consulting Physician (Oncology) [name], MD as Attending Physician (Radiation Oncology) Delice Bison Charlestine Massed, NP as Nurse Practitioner (Hematology and Oncology) OTHER MD:  CHIEF COMPLAINT: Triple negative breast cancer  CURRENT TREATMENT: Observation   INTERVAL HISTORY: [name] returns today for a follow-up and treatment of her triple negative breast cancer. She continues under observation.   Since her last visit, she underwent bilateral diagnostic mammography with tomography at [facility] on 07/14/2018 showing: breast density category B; no evidence of malignancy in either breast.  At her survivorship visit on 11/20/2018, she underwent bilateral hip x-rays for increasing pelvic pain radiating down lower extremities. Both of these were negative.  She also presented with a palpable lump to her inner left breast since about 07/2018 that became more conspicuous in early 11/2017. She proceeded to left diagnostic mammography with tomography and left breast ultrasound at [facility] on 11/24/2017. Physical exam performed at that time showed palpable skin thickening in the inner left breast without discrete mass. Scans showied breast density category C; no evidence of malignancy in the left breast; extensive post-radiation skin thickening and trabecular thickening throughout the left breast, including the medial breast in the area of patient concern.  She presented to our symptom management clinic on 11/25/2017 with bone pain. She proceeded to bone scan on 12/20/2017, which showed: no findings suggestive of metastatic disease; stable focal arthritic changes in  lower thoracic spine.   REVIEW OF SYSTEMS: [name] denies any unusual headaches visual changes cough phlegm production pleurisy shortness of breath or change in bowel or bladder habits.  Got some bone aches which she says keep her from exercising regularly.  The gabapentin is helpful with that.  She has been doing a little bit more cooking and has gained a little bit of weight which concerns her.  Aside from that a detailed review of systems today was stable   BREAST CANCER HISTORY: From the original intake note:  The patient was seen in the emergency room 05/16/2016 with nonspecific complaints but also noting that she had a tender lump in her lateral left breast which she said she had noted the day before. Exam by the emergency room physician confirmed a 3 cm soft left lateral breast mass without overlying erythema or nipple changes. This was felt to be most consistent with fibrocystic change but the patient was referred back to her primary care physician for further evaluation. She saw the physician assistant in May 18 and then Dr. Wynetta Emery on 06/12/2016 who scheduled a bilateral diagnostic mammography with tomography and left breast ultrasonography at [facility] 06/18/2016. This found the breast density to be category B. In the left breast upper outer quadrant there was an area of asymmetry measuring up to 10.3 cm. On exam this was firm and palpable and on ultrasound there was an irregular mass measuring 2.5 cm with some subtle changes in the surrounding tissue. In the left lower axilla there were 2 suspicious-looking lymph nodes.  Biopsy of the left breast upper outer quadrant and one of the suspicious lymph nodes 06/19/2016 showed ([id number]) both to be involved by invasive ductal carcinoma, grade 3, estrogen and progesterone receptor negative, HER-2 not amplified with a signals  ratio being 1.53-1.74 and the number per cell 2.35-3.05. The MIB-1 was 90%.  The patient's subsequent history is  as detailed below.   PAST MEDICAL HISTORY: Past Medical History:  Diagnosis Date  . Breast cancer ([place])    lf breast  . Complication of anesthesia    DAY SURGERY 2010 OR 2011 ASPIRIATED AND STAYED OVERNIGHT  . History of radiation therapy 03/11/17- 04/21/17   Left breast, 2 Gy in 25 fractions for a total dose of 50 Gy, Boost, 2 Gy in 5 fractions for a total dose of 10 Gy  . Hypertension    OFF MEDS  SINCE CHEMO X 5 MONTHS  . Malignant neoplasm of upper-outer quadrant of left breast in female, estrogen receptor negative ([place]) 06/23/2016  . Personal history of radiation therapy     PAST SURGICAL HISTORY: Past Surgical History:  Procedure Laterality Date  . ANKLE SURGERY    . BREAST LUMPECTOMY WITH RADIOACTIVE SEED AND SENTINEL LYMPH NODE BIOPSY Left 01/27/2017   Procedure: LEFT BREAST LUMPECTOMY WITH RADIOACTIVE SEED LOCALIZATION, LEFT AXILLARY DEEP LYMPH NODE BIOPSY WITH RADIOACTIVE SEED LOCALIZATION, LEFT AXILLARY SENTINEL LYMPH NODE MAPPING AND BIOPSY WITH BLUE DYE INJECTION;  Surgeon: [name], MD;  Location: [facility];  Service: General;  Laterality: Left;  . IR FLUORO GUIDE PORT INSERTION RIGHT  07/14/2016  . IR US GUIDE VASC ACCESS RIGHT  07/14/2016  . PORT-A-CATH REMOVAL Right 01/27/2017   Procedure: REMOVAL PORT-A-CATH;  Surgeon: [name], MD;  Location: [facility];  Service: General;  Laterality: Right;  . TUBAL LIGATION    . WISDOM TOOTH EXTRACTION      FAMILY HISTORY Family History  Problem Relation Age of Onset  . Diabetes Mother   . Hypertension Mother   . Arthritis Mother   . Colon cancer Father 24       d.76 metastatic at time of diagnosis  . Diabetes Father   . Heart disease Father   . Hypertension Father   . Breast cancer Maternal Grandmother 37       d.60s  . Breast cancer Maternal Aunt 52  . Cervical cancer Maternal Aunt 60  . Breast cancer Maternal Aunt 46       d.50s  . Cancer Maternal Uncle        d.62s unspecified type of cancer  . Breast cancer  Cousin 15       paternal first-cousin (daughter of unaffected aunt)  . Cancer Maternal Aunt 14       "Female Cancer"  . Cancer Maternal Aunt        unknown cancer  The patient's father died at age 30 from colon cancer which she never had treated. The patient's mother is living, 17 years old as of June 2018. The patient has one brother, one sister. On the mother's side there is a history of breast cancer in the grandmother, age 9, and then 1 aunt with breast cancer age 61, and another with lung cancer age 6. There is no history of ovarian cancer in the family.   GYNECOLOGIC HISTORY:  No LMP recorded. (Menstrual status: Other). Menarche age 63, first live birth age 16. She is GX P4. Her periods have never been regular. She had a period in June 2018 but not for 4 months before that. She took birth control for some years remotely with no complications. She is status post bilateral tubal ligation.   SOCIAL HISTORY:  The patient's husband owns and runs a food truck in the area. 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 Her husband Miela Desjardin (goes by "[name]") is originally from [name]. The patient has two children from her first marriage. Her son [name], who is 29 years old as of June 2018, who was recently in the [employer], but now is taking courses at [employer] and  Her daughter [name], who was studying psychology at [name] before her recent illness, which has left her 97% blind.  She however is doing remarkably well within her limitations.. Together with [name] the patient has two other children who are 66 and 16 as of October 2020     ADVANCED DIRECTIVES: Not in place   HEALTH MAINTENANCE: Social History   Tobacco Use  . Smoking status: Never Smoker  . Smokeless tobacco: Never Used  Substance Use Topics  . Alcohol use: No  . Drug use:  No     Colonoscopy:Never   BWG:YKZL 2018  Bone density:Never   Allergies  Allergen Reactions  . Carboplatin Other (See Comments)    Red, blotchy, hot    Current Outpatient Medications  Medication Sig Dispense Refill  . gabapentin (NEURONTIN) 100 MG capsule TAKE 2 CAPSULES BY MOUTH ONCE A DAY 60 capsule 1  . gabapentin (NEURONTIN) 300 MG capsule Take 2 capsules (600 mg total) by mouth at bedtime. 90 capsule 4  . HYDROcodone-acetaminophen (NORCO) 5-325 MG tablet 1/2 to 1 tablet Q 6 hours prn pain 25 tablet 0  . ibuprofen (ADVIL,MOTRIN) 200 MG tablet Take 1-2 tablets (200-400 mg total) by mouth every 8 (eight) hours as needed.     No current facility-administered medications for this visit.     OBJECTIVE: Morbidly obese white woman who appears well  Vitals:   10/13/18 1401  BP: 139/90  Pulse: 87  Resp: 18  Temp: 98.7 F (37.1 C)  SpO2: 99%     Body mass index is 43.37 kg/m.   Filed Weights   10/13/18 1401  Weight: 260 lb 9.6 oz (118.2 kg)   ECOG FS:1 - Symptomatic but completely ambulatory  Sclerae unicteric, EOMs intact Wearing a mask No cervical or supraclavicular adenopathy Lungs no rales or rhonchi Heart regular rate and rhythm Abd soft, obese, nontender, positive bowel sounds MSK no focal spinal tenderness, no upper extremity lymphedema Neuro: nonfocal, well oriented, appropriate affect Breasts: Right breast is benign.  Left breast is status post surgery and radiation.  There is no evidence of disease recurrence.  Both axillae are benign.   LAB RESULTS:  CMP     Component Value Date/Time   NA 141 10/13/2018 1346   NA 140 01/07/2017 1342   K 3.7 10/13/2018 1346   K 3.5 01/07/2017 1342   CL 105 10/13/2018 1346   CO2 28 10/13/2018 1346   CO2 27 01/07/2017 1342   GLUCOSE 85 10/13/2018 1346   GLUCOSE 148 (H) 01/07/2017 1342   BUN 9 10/13/2018 1346   BUN 6.2 (L) 01/07/2017 1342   CREATININE 0.78 10/13/2018 1346   CREATININE 0.7 01/07/2017 1342    CALCIUM 9.2 10/13/2018 1346   CALCIUM 9.4 01/07/2017 1342   PROT 7.1 10/13/2018 1346   PROT 7.0 01/07/2017 1342   ALBUMIN 3.9 10/13/2018 1346   ALBUMIN 3.7 01/07/2017 1342   AST 20 10/13/2018 1346   AST 23 01/07/2017 1342   ALT 16 10/13/2018 1346   ALT 24 01/07/2017 1342   ALKPHOS 97  10/13/2018 1346   ALKPHOS 105 01/07/2017 1342   BILITOT 0.9 10/13/2018 1346   BILITOT 0.84 01/07/2017 1342   GFRNONAA >60 10/13/2018 1346   GFRAA >60 10/13/2018 1346    No results found for: [name], A1GS, A2GS, BETS, BETA2SER, GAMS, MSPIKE, SPEI  No results found for: Nils Pyle, [facility]  Lab Results  Component Value Date   WBC 4.9 10/13/2018   NEUTROABS 2.8 10/13/2018   HGB 12.1 10/13/2018   HCT 36.9 10/13/2018   MCV 91.1 10/13/2018   PLT 155 10/13/2018      Chemistry      Component Value Date/Time   NA 141 10/13/2018 1346   NA 140 01/07/2017 1342   K 3.7 10/13/2018 1346   K 3.5 01/07/2017 1342   CL 105 10/13/2018 1346   CO2 28 10/13/2018 1346   CO2 27 01/07/2017 1342   BUN 9 10/13/2018 1346   BUN 6.2 (L) 01/07/2017 1342   CREATININE 0.78 10/13/2018 1346   CREATININE 0.7 01/07/2017 1342      Component Value Date/Time   CALCIUM 9.2 10/13/2018 1346   CALCIUM 9.4 01/07/2017 1342   ALKPHOS 97 10/13/2018 1346   ALKPHOS 105 01/07/2017 1342   AST 20 10/13/2018 1346   AST 23 01/07/2017 1342   ALT 16 10/13/2018 1346   ALT 24 01/07/2017 1342   BILITOT 0.9 10/13/2018 1346   BILITOT 0.84 01/07/2017 1342       No results found for: LABCA2  No components found for: [id number]  No results for input(s): INR in the last 168 hours.  Urinalysis    Component Value Date/Time   COLORURINE YELLOW 10/24/2016 1621   APPEARANCEUR CLEAR 10/24/2016 1621   LABSPEC 1.025 11/23/2016 1251   PHURINE 6.0 11/23/2016 1251   PHURINE 5.5 10/24/2016 1621   GLUCOSEU Negative 11/23/2016 1251   HGBUR Negative 11/23/2016 1251   HGBUR NEGATIVE 10/24/2016 1621    BILIRUBINUR Negative 11/23/2016 1251   KETONESUR Negative 11/23/2016 [place] 10/24/2016 1621   PROTEINUR Negative 11/23/2016 1251   PROTEINUR NEGATIVE 10/24/2016 1621   UROBILINOGEN 0.2 11/23/2016 1251   NITRITE Negative 11/23/2016 1251   NITRITE NEGATIVE 10/24/2016 1621   LEUKOCYTESUR Negative 11/23/2016 1251     STUDIES: Mammography results reviewed with the patient  ELIGIBLE FOR AVAILABLE RESEARCH PROTOCOL: no   ASSESSMENT: 48 y.o. 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 Additional testing of the patient's mother and maternal aunt is pending. Otherwise [facility]'s Common Hereditary Cancers Panel found no deleterious mutations in APC, ATM, AXIN2, BARD1, BMPR1A, BRCA1, BRCA2, BRIP1, CDH1, CDKN2A, CHEK2, CTNNA1, DICER1, EPCAM, GREM1, HOXB13, KIT, MEN1, MLH1, MSH2, MSH3, MSH6, MUTYH, NBN, NF1, NTHL1, PALB2, PDGFRA, PMS2, POLD1, POLE, PTEN, RAD50, RAD51C, RAD51D, SDHA, SDHB, SDHC, SDHD, SMAD4, SMARCA4, STK11, TP53, TSC1, TSC2, and VHL.  (2) neoadjuvant chemotherapy to consist of doxorubicin and cyclophosphamide in dose dense fashion 4 completed 09/10/2016 to be followed by paclitaxel weekly 12 given with carboplatin   (a) cycle 4 of cyclophosphamide and doxorubicin was delayed 10 days and dose decreased 10% because of febrile neutropenia after cycle 3  (b) cycle 6 of Paclitaxel and Carboplatin  delayed due to neutropenia, therefore Granix added to Wednesday, Thursday, Friday following chemotherapy days  (3) status post left lumpectomy and left axillary sentinel lymph node sampling  01/27/2017 with pathology showing a complete pathologic response (ypT0 ypN0); 5 left axillary lymph nodes removed  (4) adjuvant radiation 03/11/2017-04/21/2017 Site/dose:   1. Left breast, 2 Gy in 25 fractions for a total dose of 50 Gy                      2. Boost, 2 Gy in 5 fractions for a total dose of 10 Gy   PLAN: Manasvini will soon be 2 years out from definitive surgery for her breast cancer with no evidence of disease recurrence.  This is very favorable.  Her husband's business managed to survive the pandemic.  Her daughter [name] even though she is nearly completely blind, is making a surprising adjustment, gaining some independence, and studying to be a masseuse.  Leviste a 55 year old daughter is graduating early from a science program with a sophomore's worth of classes so she only needs 2 more years to complete college.  Suggested that they contact Delories Heinz at [facility] for advice regarding medical school applications in the future  Otherwise low we will have her mammogram again next year and see me a year from now  She knows to call for any other issues that may develop before the next visit.  Klohe Lovering, [name], MD  10/13/18 2:29 PM Medical Oncology and Hematology [facility] [address] [place], [place] [address] Tel. [phone]    Fax. [phone]   I, Wilburn Mylar, am acting as scribe for Dr. [name]. [name].  I, Lurline Del MD, have reviewed the above documentation for accuracy and completeness, and I agree with the above.

## 2018-10-13 ENCOUNTER — Other Ambulatory Visit: Payer: Self-pay

## 2018-10-13 ENCOUNTER — Inpatient Hospital Stay (HOSPITAL_BASED_OUTPATIENT_CLINIC_OR_DEPARTMENT_OTHER): Payer: Medicaid Other | Admitting: Oncology

## 2018-10-13 ENCOUNTER — Inpatient Hospital Stay: Payer: Medicaid Other | Attending: Oncology

## 2018-10-13 VITALS — BP 139/90 | HR 87 | Temp 98.7°F | Resp 18 | Wt 260.6 lb

## 2018-10-13 DIAGNOSIS — C50412 Malignant neoplasm of upper-outer quadrant of left female breast: Secondary | ICD-10-CM | POA: Diagnosis not present

## 2018-10-13 DIAGNOSIS — I10 Essential (primary) hypertension: Secondary | ICD-10-CM | POA: Insufficient documentation

## 2018-10-13 DIAGNOSIS — Z923 Personal history of irradiation: Secondary | ICD-10-CM | POA: Insufficient documentation

## 2018-10-13 DIAGNOSIS — Z79899 Other long term (current) drug therapy: Secondary | ICD-10-CM | POA: Insufficient documentation

## 2018-10-13 DIAGNOSIS — Z9221 Personal history of antineoplastic chemotherapy: Secondary | ICD-10-CM | POA: Diagnosis not present

## 2018-10-13 DIAGNOSIS — Z171 Estrogen receptor negative status [ER-]: Secondary | ICD-10-CM | POA: Insufficient documentation

## 2018-10-13 LAB — CBC WITH DIFFERENTIAL (CANCER CENTER ONLY)
Abs Immature Granulocytes: 0.01 10*3/uL (ref 0.00–0.07)
Basophils Absolute: 0 10*3/uL (ref 0.0–0.1)
Basophils Relative: 1 %
Eosinophils Absolute: 0.1 10*3/uL (ref 0.0–0.5)
Eosinophils Relative: 2 %
HCT: 36.9 % (ref 36.0–46.0)
Hemoglobin: 12.1 g/dL (ref 12.0–15.0)
Immature Granulocytes: 0 %
Lymphocytes Relative: 33 %
Lymphs Abs: 1.6 10*3/uL (ref 0.7–4.0)
MCH: 29.9 pg (ref 26.0–34.0)
MCHC: 32.8 g/dL (ref 30.0–36.0)
MCV: 91.1 fL (ref 80.0–100.0)
Monocytes Absolute: 0.3 10*3/uL (ref 0.1–1.0)
Monocytes Relative: 7 %
Neutro Abs: 2.8 10*3/uL (ref 1.7–7.7)
Neutrophils Relative %: 57 %
Platelet Count: 155 10*3/uL (ref 150–400)
RBC: 4.05 MIL/uL (ref 3.87–5.11)
RDW: 13.4 % (ref 11.5–15.5)
WBC Count: 4.9 10*3/uL (ref 4.0–10.5)
nRBC: 0 % (ref 0.0–0.2)

## 2018-10-13 LAB — CMP (CANCER CENTER ONLY)
ALT: 16 U/L (ref 0–44)
AST: 20 U/L (ref 15–41)
Albumin: 3.9 g/dL (ref 3.5–5.0)
Alkaline Phosphatase: 97 U/L (ref 38–126)
Anion gap: 8 (ref 5–15)
BUN: 9 mg/dL (ref 6–20)
CO2: 28 mmol/L (ref 22–32)
Calcium: 9.2 mg/dL (ref 8.9–10.3)
Chloride: 105 mmol/L (ref 98–111)
Creatinine: 0.78 mg/dL (ref 0.44–1.00)
GFR, Est AFR Am: >60 mL/min (ref >60)
GFR, Estimated: >60 mL/min (ref >60)
Glucose, Bld: 85 mg/dL (ref 70–99)
Potassium: 3.7 mmol/L (ref 3.5–5.1)
Sodium: 141 mmol/L (ref 135–145)
Total Bilirubin: 0.9 mg/dL (ref 0.3–1.2)
Total Protein: 7.1 g/dL (ref 6.5–8.1)

## 2018-10-14 ENCOUNTER — Telehealth: Payer: Self-pay | Admitting: Oncology

## 2018-10-14 NOTE — Telephone Encounter (Signed)
I left a message regarding schedule  

## 2018-10-25 MED FILL — GABAPENTIN 100 MG CAPSULE: 100 | 30 days supply | Qty: 60 | Fill #1

## 2018-10-25 MED FILL — GABAPENTIN 300 MG CAPSULE: 300 | 45 days supply | Qty: 90 | Fill #1

## 2018-12-08 MED FILL — GABAPENTIN 300 MG CAPSULE: 300 | 45 days supply | Qty: 90 | Fill #2

## 2019-01-18 ENCOUNTER — Other Ambulatory Visit: Payer: Self-pay

## 2019-01-18 ENCOUNTER — Emergency Department (HOSPITAL_BASED_OUTPATIENT_CLINIC_OR_DEPARTMENT_OTHER)
Admission: EM | Admit: 2019-01-18 | Discharge: 2019-01-18 | Disposition: A | Discharge status: Home / Self Care | Payer: Medicaid Other | Attending: Emergency Medicine | Admitting: Emergency Medicine

## 2019-01-18 ENCOUNTER — Encounter (HOSPITAL_BASED_OUTPATIENT_CLINIC_OR_DEPARTMENT_OTHER): Payer: Self-pay | Admitting: Emergency Medicine

## 2019-01-18 ENCOUNTER — Emergency Department (HOSPITAL_BASED_OUTPATIENT_CLINIC_OR_DEPARTMENT_OTHER): Payer: Medicaid Other

## 2019-01-18 DIAGNOSIS — Z888 Allergy status to other drugs, medicaments and biological substances status: Secondary | ICD-10-CM | POA: Diagnosis not present

## 2019-01-18 DIAGNOSIS — N201 Calculus of ureter: Secondary | ICD-10-CM

## 2019-01-18 DIAGNOSIS — R1013 Epigastric pain: Secondary | ICD-10-CM | POA: Diagnosis present

## 2019-01-18 DIAGNOSIS — I10 Essential (primary) hypertension: Secondary | ICD-10-CM | POA: Insufficient documentation

## 2019-01-18 DIAGNOSIS — Z79899 Other long term (current) drug therapy: Secondary | ICD-10-CM | POA: Diagnosis not present

## 2019-01-18 DIAGNOSIS — N132 Hydronephrosis with renal and ureteral calculous obstruction: Secondary | ICD-10-CM | POA: Diagnosis not present

## 2019-01-18 DIAGNOSIS — Z853 Personal history of malignant neoplasm of breast: Secondary | ICD-10-CM | POA: Diagnosis not present

## 2019-01-18 DIAGNOSIS — K8020 Calculus of gallbladder without cholecystitis without obstruction: Secondary | ICD-10-CM | POA: Insufficient documentation

## 2019-01-18 LAB — COMPREHENSIVE METABOLIC PANEL
ALT: 14 U/L (ref 0–44)
AST: 17 U/L (ref 15–41)
Albumin: 4.4 g/dL (ref 3.5–5.0)
Alkaline Phosphatase: 89 U/L (ref 38–126)
Anion gap: 8 (ref 5–15)
BUN: 15 mg/dL (ref 6–20)
CO2: 31 mmol/L (ref 22–32)
Calcium: 9.7 mg/dL (ref 8.9–10.3)
Chloride: 102 mmol/L (ref 98–111)
Creatinine, Ser: 0.96 mg/dL (ref 0.44–1.00)
GFR calc Af Amer: >60 mL/min (ref >60)
GFR calc non Af Amer: >60 mL/min (ref >60)
Glucose, Bld: 132 mg/dL — ABNORMAL HIGH (ref 70–99)
Potassium: 3.3 mmol/L — ABNORMAL LOW (ref 3.5–5.1)
Sodium: 141 mmol/L (ref 135–145)
Total Bilirubin: 0.6 mg/dL (ref 0.3–1.2)
Total Protein: 6.9 g/dL (ref 6.5–8.1)

## 2019-01-18 LAB — URINALYSIS, ROUTINE W REFLEX MICROSCOPIC
Bilirubin Urine: NEGATIVE
Glucose, UA: NEGATIVE mg/dL
Ketones, ur: NEGATIVE mg/dL
Leukocytes,Ua: NEGATIVE
Nitrite: NEGATIVE
Protein, ur: NEGATIVE mg/dL
Specific Gravity, Urine: 1.025 (ref 1.005–1.030)
pH: 5 (ref 5.0–8.0)

## 2019-01-18 LAB — CBC WITH DIFFERENTIAL/PLATELET
Abs Immature Granulocytes: 0.02 10*3/uL (ref 0.00–0.07)
Basophils Absolute: 0 10*3/uL (ref 0.0–0.1)
Basophils Relative: 0 %
Eosinophils Absolute: 0.1 10*3/uL (ref 0.0–0.5)
Eosinophils Relative: 3 %
HCT: 40.7 % (ref 36.0–46.0)
Hemoglobin: 13.4 g/dL (ref 12.0–15.0)
Immature Granulocytes: 0 %
Lymphocytes Relative: 32 %
Lymphs Abs: 1.5 10*3/uL (ref 0.7–4.0)
MCH: 30 pg (ref 26.0–34.0)
MCHC: 32.9 g/dL (ref 30.0–36.0)
MCV: 91.1 fL (ref 80.0–100.0)
Monocytes Absolute: 0.3 10*3/uL (ref 0.1–1.0)
Monocytes Relative: 7 %
Neutro Abs: 2.8 10*3/uL (ref 1.7–7.7)
Neutrophils Relative %: 58 %
Platelets: 161 10*3/uL (ref 150–400)
RBC: 4.47 MIL/uL (ref 3.87–5.11)
RDW: 13 % (ref 11.5–15.5)
WBC: 4.7 10*3/uL (ref 4.0–10.5)
nRBC: 0 % (ref 0.0–0.2)

## 2019-01-18 LAB — URINALYSIS, MICROSCOPIC (REFLEX): RBC / HPF: >50 RBC/hpf (ref 0–5)

## 2019-01-18 LAB — PREGNANCY, URINE: Preg Test, Ur: NEGATIVE

## 2019-01-18 MED ORDER — ONDANSETRON HCL 4 MG/2ML IJ SOLN
4.0000 mg | Freq: Once | INTRAMUSCULAR | Status: AC
  Administered 2019-01-18: 09:00:00 4 mg via INTRAVENOUS
  Filled 2019-01-18: qty 2

## 2019-01-18 MED ORDER — ONDANSETRON HCL 4 MG/2ML IJ SOLN
4.0000 mg | Freq: Once | INTRAMUSCULAR | Status: AC
  Administered 2019-01-18: 08:00:00 4 mg via INTRAVENOUS
  Filled 2019-01-18: qty 2

## 2019-01-18 MED ORDER — TAMSULOSIN HCL 0.4 MG PO CAPS: 0.4000 mg | ORAL_CAPSULE | Freq: Every day | ORAL | 0 refills | Status: DC

## 2019-01-18 MED ORDER — IOHEXOL 300 MG/ML  SOLN
100.0000 mL | Freq: Once | INTRAMUSCULAR | Status: AC | PRN
  Administered 2019-01-18: 100 mL via INTRAVENOUS

## 2019-01-18 MED ORDER — HYDROMORPHONE HCL 1 MG/ML IJ SOLN
1.0000 mg | Freq: Once | INTRAMUSCULAR | Status: AC
  Administered 2019-01-18: 1 mg via INTRAVENOUS
  Filled 2019-01-18: qty 1

## 2019-01-18 MED ORDER — SODIUM CHLORIDE 0.9 % IV BOLUS
1000.0000 mL | Freq: Once | INTRAVENOUS | Status: AC
  Administered 2019-01-18: 1000 mL via INTRAVENOUS

## 2019-01-18 MED ORDER — OXYCODONE-ACETAMINOPHEN 5-325 MG PO TABS: 1.0000 | ORAL_TABLET | Freq: Four times a day (QID) | ORAL | 0 refills | Status: DC | PRN

## 2019-01-18 MED ORDER — KETOROLAC TROMETHAMINE 30 MG/ML IJ SOLN
30.0000 mg | Freq: Once | INTRAMUSCULAR | Status: AC
  Administered 2019-01-18: 30 mg via INTRAVENOUS
  Filled 2019-01-18: qty 1

## 2019-01-18 MED ORDER — MORPHINE SULFATE (PF) 4 MG/ML IV SOLN
4.0000 mg | Freq: Once | INTRAVENOUS | Status: AC
  Administered 2019-01-18: 4 mg via INTRAVENOUS
  Filled 2019-01-18: qty 1

## 2019-01-18 MED ORDER — ONDANSETRON HCL 4 MG PO TABS: 4.0000 mg | ORAL_TABLET | Freq: Three times a day (TID) | ORAL | 0 refills | Status: DC | PRN

## 2019-01-18 MED FILL — ONDANSETRON HCL 4 MG TABLET: 4 | 7 days supply | Qty: 20 | Fill #0

## 2019-01-18 MED FILL — OXYCODONE-ACETAMINOPHEN 5-3: 5-325 | 2 days supply | Qty: 15 | Fill #0

## 2019-01-18 MED FILL — TAMSULOSIN HCL 0.4 MG CAP: 0.4 | 30 days supply | Qty: 30 | Fill #0

## 2019-01-18 NOTE — Discharge Instructions (Addendum)
You were seen in the emergency department for left-sided abdominal pain.  You had blood work urinalysis and a CAT scan which showed you have a kidney stone in the ureter or tube in between your kidney and bladder.  This is likely the cause of your pain.  We are prescribing you pain medication nausea medication and medication to help pass the stone.  Please contact urology for close follow-up.  Return to the emergency department if you experience uncontrolled pain or a fever.  You also incidentally had a gallstone in your gallbladder that did not show any signs of causing you problems now.

## 2019-01-18 NOTE — ED Provider Notes (Signed)
[facility] EMERGENCY DEPARTMENT Provider Note   CSN: [id number] Arrival date & time: 01/18/19  [id number]     History Chief Complaint  Patient presents with  . Abdominal Pain    [name] is a 49 y.o. female.  She has a history of breast cancer but has been off chemotherapy for over a year.  She said she woke up around 3 AM with epigastric abdominal pain that wraps around her left side into her back.  Getting progressively worse over the last few hours.  Associated with nausea and vomiting.  Normal bowel movement yesterday.  No urinary symptoms no fever.  No prior history of this pain.  No cough or shortness of breath.  No recent travel, sick contacts or change in diet.  History of a tubal ligation.  The history is provided by the patient.  Abdominal Pain Pain location:  Epigastric Pain quality: aching   Pain radiates to:  L flank Pain severity:  Severe Onset quality:  Sudden Duration:  5 hours Timing:  Constant Progression:  Worsening Chronicity:  New Context: awakening from sleep   Context: not recent travel, not sick contacts, not suspicious food intake and not trauma   Relieved by:  None tried Worsened by:  Nothing Ineffective treatments:  None tried Associated symptoms: nausea and vomiting   Associated symptoms: no chest pain, no chills, no constipation, no cough, no diarrhea, no dysuria, no fever, no hematemesis, no hematochezia, no hematuria, no melena, no shortness of breath and no sore throat        Past Medical History:  Diagnosis Date  . Breast cancer ([place])    lf breast  . Complication of anesthesia    DAY SURGERY 2010 OR 2011 ASPIRIATED AND STAYED OVERNIGHT  . History of radiation therapy 03/11/17- 04/21/17   Left breast, 2 Gy in 25 fractions for a total dose of 50 Gy, Boost, 2 Gy in 5 fractions for a total dose of 10 Gy  . Hypertension    OFF MEDS  SINCE CHEMO X 5 MONTHS  . Malignant neoplasm of upper-outer quadrant of left breast in female,  estrogen receptor negative ([place]) 06/23/2016  . Personal history of radiation therapy     Patient Active Problem List   Diagnosis Date Noted  . Port-A-Cath in place 11/02/2016  . Cellulitis 10/24/2016  . Family history of breast cancer 10/08/2016  . Genetic testing 09/21/2016  . Sepsis ([name]) 08/27/2016  . Rash 08/27/2016  . Hypokalemia 08/27/2016  . Neutropenic fever ([place]) 08/26/2016  . Constipation 07/27/2016  . Malignant neoplasm of upper-outer quadrant of left breast in female, estrogen receptor negative ([place]) 06/23/2016  . Left breast mass 06/12/2016  . Essential hypertension 06/12/2016  . Class 3 severe obesity due to excess calories without serious comorbidity with body mass index (BMI) of 40.0 to 44.9 in adult ([place]) 06/12/2016  . Low vitamin D level 06/12/2016  . Irregular menses 06/12/2016    Past Surgical History:  Procedure Laterality Date  . ANKLE SURGERY    . BREAST LUMPECTOMY WITH RADIOACTIVE SEED AND SENTINEL LYMPH NODE BIOPSY Left 01/27/2017   Procedure: LEFT BREAST LUMPECTOMY WITH RADIOACTIVE SEED LOCALIZATION, LEFT AXILLARY DEEP LYMPH NODE BIOPSY WITH RADIOACTIVE SEED LOCALIZATION, LEFT AXILLARY SENTINEL LYMPH NODE MAPPING AND BIOPSY WITH BLUE DYE INJECTION;  Surgeon: [name], MD;  Location: [facility];  Service: General;  Laterality: Left;  . IR FLUORO GUIDE PORT INSERTION RIGHT  07/14/2016  . IR US GUIDE VASC ACCESS RIGHT  07/14/2016  .  PORT-A-CATH REMOVAL Right 01/27/2017   Procedure: REMOVAL PORT-A-CATH;  Surgeon: [name], MD;  Location: [facility];  Service: General;  Laterality: Right;  . TUBAL LIGATION    . WISDOM TOOTH EXTRACTION       OB History    Gravida  6   Sarah      Term      Preterm      AB  2   Living  4     SAB  2   TAB      Ectopic      Multiple      Live Births  4           Family History  Problem Relation Age of Onset  . Diabetes Mother   . Hypertension Mother   . Arthritis Mother   . Colon cancer Father 66        d.76 metastatic at time of diagnosis  . Diabetes Father   . Heart disease Father   . Hypertension Father   . Breast cancer Maternal Grandmother 78       d.60s  . Breast cancer Maternal Aunt 52  . Cervical cancer Maternal Aunt 46  . Breast cancer Maternal Aunt 46       d.50s  . Cancer Maternal Uncle        d.62s unspecified type of cancer  . Breast cancer Cousin 71       paternal first-cousin (daughter of unaffected aunt)  . Cancer Maternal Aunt 40       "Female Cancer"  . Cancer Maternal Aunt        unknown cancer    Social History   Tobacco Use  . Smoking status: Never Smoker  . Smokeless tobacco: Never Used  Substance Use Topics  . Alcohol use: No  . Drug use: No    Home Medications Prior to Admission medications   Medication Sig Start Date End Date Taking? Authorizing Provider  gabapentin (NEURONTIN) 100 MG capsule TAKE 2 CAPSULES BY MOUTH ONCE A DAY 08/09/18  Yes Magrinat, [name], MD  gabapentin (NEURONTIN) 300 MG capsule Take 2 capsules (600 mg total) by mouth at bedtime. 08/10/18  Yes Magrinat, [name], MD  HYDROcodone-acetaminophen [facility]) 5-325 MG tablet 1/2 to 1 tablet Q 6 hours prn pain 12/14/17   Magrinat, [name], MD  ibuprofen (ADVIL,MOTRIN) 200 MG tablet Take 1-2 tablets (200-400 mg total) by mouth every 8 (eight) hours as needed. 02/10/17   Magrinat, [name], MD    Allergies    Carboplatin  Review of Systems   Review of Systems  Constitutional: Negative for chills and fever.  HENT: Negative for sore throat.   Eyes: Negative for visual disturbance.  Respiratory: Negative for cough and shortness of breath.   Cardiovascular: Negative for chest pain.  Gastrointestinal: Positive for abdominal pain, nausea and vomiting. Negative for constipation, diarrhea, hematemesis, hematochezia and melena.  Genitourinary: Negative for dysuria and hematuria.  Musculoskeletal: Negative for neck pain.  Skin: Negative for rash.  Neurological: Negative for headaches.     Physical Exam Updated Vital Signs BP (!) 145/82 (BP Location: Right Arm)   Pulse 88   Temp 97.9 F (36.6 C) (Oral)   Resp 18   Ht 5' 7.5" (1.715 m)   Wt 119.3 kg   SpO2 99%   BMI 40.58 kg/m   Physical Exam Vitals and nursing note reviewed.  Constitutional:      General: She is not in acute distress.  Appearance: She is well-developed.  HENT:     Head: Normocephalic and atraumatic.  Eyes:     Conjunctiva/sclera: Conjunctivae normal.  Cardiovascular:     Rate and Rhythm: Normal rate and regular rhythm.     Heart sounds: No murmur.  Pulmonary:     Effort: Pulmonary effort is normal. No respiratory distress.     Breath sounds: Normal breath sounds.  Abdominal:     Palpations: Abdomen is soft.     Tenderness: There is no abdominal tenderness. There is no guarding or rebound.  Musculoskeletal:        General: No deformity or signs of injury. Normal range of motion.     Cervical back: Neck supple.  Skin:    General: Skin is warm and dry.     Capillary Refill: Capillary refill takes less than 2 seconds.  Neurological:     General: No focal deficit present.     Mental Status: She is alert.     ED Results / Procedures / Treatments   Labs (all labs ordered are listed, but only abnormal results are displayed) Labs Reviewed  COMPREHENSIVE METABOLIC PANEL - Abnormal; Notable for the following components:      Result Value   Potassium 3.3 (*)    Glucose, Bld 132 (*)    All other components within normal limits  URINALYSIS, ROUTINE W REFLEX MICROSCOPIC - Abnormal; Notable for the following components:   APPearance HAZY (*)    Hgb urine dipstick LARGE (*)    All other components within normal limits  URINALYSIS, MICROSCOPIC (REFLEX) - Abnormal; Notable for the following components:   Bacteria, UA MANY (*)    All other components within normal limits  CBC WITH DIFFERENTIAL/PLATELET  PREGNANCY, URINE    EKG None  Radiology CT Abdomen Pelvis W Contrast  Result  Date: 01/18/2019 CLINICAL DATA:  Awoke this morning with low abdominal pain radiating to the left side with nausea and vomiting EXAM: CT ABDOMEN AND PELVIS WITH CONTRAST TECHNIQUE: Multidetector CT imaging of the abdomen and pelvis was performed using the standard protocol following bolus administration of intravenous contrast. CONTRAST:  12mL OMNIPAQUE IOHEXOL 300 MG/ML  SOLN COMPARISON:  01/04/2010 FINDINGS: Lower chest: No acute abnormality. Hepatobiliary: Normal liver. Single dependent gallstone. Evidence of acute cholecystitis. No bile duct dilation. Pancreas: Unremarkable. No pancreatic ductal dilatation or surrounding inflammatory changes. Spleen: Normal in size without focal abnormality. Adrenals/Urinary Tract: No adrenal masses. Kidneys normal in size, orientation and position. There is mild left perinephric stranding and left hydronephrosis. This is due to a 1-2 mm stone proximal left ureter. No intrarenal stones. No renal masses on either side. No right hydronephrosis. Remainder of the left ureter is decompressed no additional stones. Normal bladder. Stomach/Bowel: Normal stomach. Small bowel and colon are normal in caliber. No bowel wall thickening or adjacent inflammation. Normal appendix visualized. Vascular/Lymphatic: No significant vascular findings are present. No enlarged abdominal or pelvic lymph nodes. Reproductive: Uterus and bilateral adnexa are unremarkable. Other: Periumbilical fat containing hernia, unchanged from the prior CT. No other hernias. No ascites. Musculoskeletal: No fracture or acute finding.  No bone lesion. IMPRESSION: 1. 1-2 mm stone in the proximal left ureter leads to mild left hydronephrosis and perinephric stranding. This accounts for the patient's pain. 2. No other acute abnormality.  No intrarenal stones. 3. Single gallstone, new since the prior CT. 4. Stable fat containing umbilical hernia. Electronically Signed   By: Lajean Manes M.D.   On: 01/18/2019 10:31  Procedures Procedures (including critical care time)  Medications Ordered in ED Medications  morphine 4 MG/ML injection 4 mg (4 mg Intravenous Given 01/18/19 0821)  ondansetron (ZOFRAN) injection 4 mg (4 mg Intravenous Given 01/18/19 0821)  sodium chloride 0.9 % bolus 1,000 mL (0 mLs Intravenous Stopped 01/18/19 1051)  ketorolac (TORADOL) 30 MG/ML injection 30 mg (30 mg Intravenous Given 01/18/19 0902)  ondansetron (ZOFRAN) injection 4 mg (4 mg Intravenous Given 01/18/19 0902)  iohexol (OMNIPAQUE) 300 MG/ML solution 100 mL (100 mLs Intravenous Contrast Given 01/18/19 1000)  HYDROmorphone (DILAUDID) injection 1 mg (1 mg Intravenous Given 01/18/19 1050)    ED Course  I have reviewed the triage vital signs and the nursing notes.  Pertinent labs & imaging results that were available during my care of the patient were reviewed by me and considered in my medical decision making (see chart for details).  Clinical Course as of Jan 18 1707  Wed Jan 18, 3331  4275 49 year old female with sudden onset of epigastric and left-sided abdominal pain radiating through to her left back and flank.  Associate with nausea vomiting.  Differential includes gastritis, cholelithiasis, cholecystitis, pancreatitis, pyelonephritis, renal colic.  Getting some screening labs urinalysis giving some IV fluids pain medicine nausea medicine.  Will likely need imaging.   [MB]  0803 Have held off on ordering a lipase as our chemistry analyzer is down and the test would need to be shipped to [facility] likely taking 3 to 4 hours.   [MB]  [id number] Informed that the patient was still in a lot of pain and nauseous and vomiting.  I was hoping to get her renal function back before I order Toradol but will order it now.  Repeating Zofran.   [MB]  [id number] Patient CT showing a single gallstone with no evidence of inflammation around the gallbladder.  She also has a 1 to 2 mm proximal left ureteral stone likely cause of symptoms.  Reviewed  this with patient.  She still looks uncomfortable so getting another dose of some pain medicine.   [MB]  1120 Patient's pain is much improved.  Waiting on urinalysis to make sure she is safe for discharge.   [MB]    Clinical Course User Index [MB] Hayden Rasmussen, MD   MDM Rules/Calculators/A&P                     [name] was evaluated in Emergency Department on 01/18/2019 for the symptoms described in the history of present illness. She was evaluated in the context of the global COVID-19 pandemic, which necessitated consideration that the patient might be at risk for infection with the SARS-CoV-2 virus that causes COVID-19. Institutional protocols and algorithms that pertain to the evaluation of patients at risk for COVID-19 are in a state of rapid change based on information released by regulatory bodies including the CDC and federal and state organizations. These policies and algorithms were followed during the patient's care in the ED.  Final Clinical Impression(s) / ED Diagnoses Final diagnoses:  Ureterolithiasis  Calculus of gallbladder without cholecystitis without obstruction    Rx / DC Orders ED Discharge Orders         Ordered    oxyCODONE-acetaminophen (PERCOCET/ROXICET) 5-325 MG tablet  Every 6 hours PRN     01/18/19 1138    ondansetron (ZOFRAN) 4 MG tablet  Every 8 hours PRN     01/18/19 1138    tamsulosin (FLOMAX) 0.4 MG CAPS capsule  Daily  01/18/19 1138           Hayden Rasmussen, MD 01/18/19 1710

## 2019-01-18 NOTE — ED Notes (Addendum)
Pt returned from CT with increased pain and nausea.

## 2019-01-18 NOTE — ED Triage Notes (Signed)
Pt here with umbilical abd pain x 3 hours with N/V.

## 2019-02-22 ENCOUNTER — Other Ambulatory Visit: Payer: Self-pay | Admitting: Oncology

## 2019-02-22 DIAGNOSIS — Z171 Estrogen receptor negative status [ER-]: Secondary | ICD-10-CM

## 2019-02-22 DIAGNOSIS — C50412 Malignant neoplasm of upper-outer quadrant of left female breast: Secondary | ICD-10-CM

## 2019-02-22 MED FILL — GABAPENTIN 100 MG CAPSULE: 100 | 30 days supply | Qty: 60 | Fill #0

## 2019-02-22 MED FILL — GABAPENTIN 300 MG CAPSULE: 300 | 45 days supply | Qty: 90 | Fill #3

## 2019-04-24 MED FILL — GABAPENTIN 300 MG CAPSULE: 300 | 45 days supply | Qty: 90 | Fill #4

## 2019-04-24 MED FILL — GABAPENTIN 100 MG CAPSULE: 100 | 30 days supply | Qty: 60 | Fill #1

## 2019-05-12 MED FILL — GABAPENTIN 300 MG CAPSULE: 300 | 45 days supply | Qty: 90 | Fill #4

## 2019-05-12 MED FILL — GABAPENTIN 100 MG CAPSULE: 100 | 30 days supply | Qty: 60 | Fill #1

## 2019-05-23 ENCOUNTER — Encounter (HOSPITAL_BASED_OUTPATIENT_CLINIC_OR_DEPARTMENT_OTHER): Payer: Self-pay | Admitting: *Deleted

## 2019-05-23 ENCOUNTER — Other Ambulatory Visit: Payer: Self-pay

## 2019-05-23 ENCOUNTER — Emergency Department (HOSPITAL_BASED_OUTPATIENT_CLINIC_OR_DEPARTMENT_OTHER)
Admission: EM | Admit: 2019-05-23 | Discharge: 2019-05-23 | Disposition: A | Discharge status: Home / Self Care | Payer: Medicaid Other | Attending: Emergency Medicine | Admitting: Emergency Medicine

## 2019-05-23 ENCOUNTER — Emergency Department (HOSPITAL_BASED_OUTPATIENT_CLINIC_OR_DEPARTMENT_OTHER): Payer: Medicaid Other

## 2019-05-23 DIAGNOSIS — R0789 Other chest pain: Secondary | ICD-10-CM | POA: Insufficient documentation

## 2019-05-23 DIAGNOSIS — Z888 Allergy status to other drugs, medicaments and biological substances status: Secondary | ICD-10-CM | POA: Diagnosis not present

## 2019-05-23 DIAGNOSIS — R079 Chest pain, unspecified: Secondary | ICD-10-CM

## 2019-05-23 DIAGNOSIS — Z853 Personal history of malignant neoplasm of breast: Secondary | ICD-10-CM | POA: Diagnosis not present

## 2019-05-23 DIAGNOSIS — Z79899 Other long term (current) drug therapy: Secondary | ICD-10-CM | POA: Diagnosis not present

## 2019-05-23 DIAGNOSIS — I10 Essential (primary) hypertension: Secondary | ICD-10-CM | POA: Insufficient documentation

## 2019-05-23 LAB — BASIC METABOLIC PANEL
Anion gap: 8 (ref 5–15)
BUN: 8 mg/dL (ref 6–20)
CO2: 25 mmol/L (ref 22–32)
Calcium: 8.7 mg/dL — ABNORMAL LOW (ref 8.9–10.3)
Chloride: 105 mmol/L (ref 98–111)
Creatinine, Ser: 0.58 mg/dL (ref 0.44–1.00)
GFR calc Af Amer: >60 mL/min (ref >60)
GFR calc non Af Amer: >60 mL/min (ref >60)
Glucose, Bld: 101 mg/dL — ABNORMAL HIGH (ref 70–99)
Potassium: 3.4 mmol/L — ABNORMAL LOW (ref 3.5–5.1)
Sodium: 138 mmol/L (ref 135–145)

## 2019-05-23 LAB — CBC
HCT: 35.5 % — ABNORMAL LOW (ref 36.0–46.0)
Hemoglobin: 12 g/dL (ref 12.0–15.0)
MCH: 29.7 pg (ref 26.0–34.0)
MCHC: 33.8 g/dL (ref 30.0–36.0)
MCV: 87.9 fL (ref 80.0–100.0)
Platelets: 161 10*3/uL (ref 150–400)
RBC: 4.04 MIL/uL (ref 3.87–5.11)
RDW: 13.2 % (ref 11.5–15.5)
WBC: 4.6 10*3/uL (ref 4.0–10.5)
nRBC: 0 % (ref 0.0–0.2)

## 2019-05-23 LAB — TROPONIN I (HIGH SENSITIVITY)
Troponin I (High Sensitivity): 5 ng/L (ref <18)
Troponin I (High Sensitivity): 6 ng/L (ref <18)

## 2019-05-23 LAB — D-DIMER, QUANTITATIVE: D-Dimer, Quant: 0.35 ug/mL-FEU (ref 0.00–0.50)

## 2019-05-23 MED ORDER — ONDANSETRON HCL 4 MG/2ML IJ SOLN
4.0000 mg | Freq: Once | INTRAMUSCULAR | Status: AC
  Administered 2019-05-23: 4 mg via INTRAVENOUS
  Filled 2019-05-23: qty 2

## 2019-05-23 MED ORDER — SODIUM CHLORIDE 0.9% FLUSH
3.0000 mL | Freq: Once | INTRAVENOUS | Status: DC
  Filled 2019-05-23: qty 3

## 2019-05-23 NOTE — ED Provider Notes (Signed)
[facility] EMERGENCY DEPARTMENT Provider Note   CSN: [id number] Arrival date & time: 05/23/19  1830     History Chief Complaint  Patient presents with  . Chest Pain    [name] is a 49 y.o. female.   Presents to the emergency room with chief complaint of chest pain.  Notable medical history breast cancer in remission, positive family history father with heart disease, hypertension with significant chart but not on antihypertensives, non-smoker.  Late afternoon/early evening today, patient started to have dull chest heaviness/pressure sensation on her left side.  States that initially it was quite severe but has steadily been subsiding.  Occurred at rest, not associated with exertion.  Initially up to 10 out of 10 but now quite mild.  Nonradiating.  No associated difficulty breathing, no cough or fever.  Reports that she had echocardiogram monitoring because of one of her chemotherapy agents.  Per chart review, last echo 2019 grossly normal.  HPI     Past Medical History:  Diagnosis Date  . Breast cancer ([place])    lf breast  . Complication of anesthesia    DAY SURGERY 2010 OR 2011 ASPIRIATED AND STAYED OVERNIGHT  . History of radiation therapy 03/11/17- 04/21/17   Left breast, 2 Gy in 25 fractions for a total dose of 50 Gy, Boost, 2 Gy in 5 fractions for a total dose of 10 Gy  . Hypertension    OFF MEDS  SINCE CHEMO X 5 MONTHS  . Malignant neoplasm of upper-outer quadrant of left breast in female, estrogen receptor negative ([place]) 06/23/2016  . Personal history of radiation therapy     Patient Active Problem List   Diagnosis Date Noted  . Port-A-Cath in place 11/02/2016  . Cellulitis 10/24/2016  . Family history of breast cancer 10/08/2016  . Genetic testing 09/21/2016  . Sepsis ([place]) 08/27/2016  . Rash 08/27/2016  . Hypokalemia 08/27/2016  . Neutropenic fever ([place]) 08/26/2016  . Constipation 07/27/2016  . Malignant neoplasm of upper-outer quadrant of  left breast in female, estrogen receptor negative ([place]) 06/23/2016  . Left breast mass 06/12/2016  . Essential hypertension 06/12/2016  . Class 3 severe obesity due to excess calories without serious comorbidity with body mass index (BMI) of 40.0 to 44.9 in adult ([place]) 06/12/2016  . Low vitamin D level 06/12/2016  . Irregular menses 06/12/2016    Past Surgical History:  Procedure Laterality Date  . ANKLE SURGERY    . BREAST LUMPECTOMY WITH RADIOACTIVE SEED AND SENTINEL LYMPH NODE BIOPSY Left 01/27/2017   Procedure: LEFT BREAST LUMPECTOMY WITH RADIOACTIVE SEED LOCALIZATION, LEFT AXILLARY DEEP LYMPH NODE BIOPSY WITH RADIOACTIVE SEED LOCALIZATION, LEFT AXILLARY SENTINEL LYMPH NODE MAPPING AND BIOPSY WITH BLUE DYE INJECTION;  Surgeon: [name], MD;  Location: [facility];  Service: General;  Laterality: Left;  . IR FLUORO GUIDE PORT INSERTION RIGHT  07/14/2016  . IR US GUIDE VASC ACCESS RIGHT  07/14/2016  . PORT-A-CATH REMOVAL Right 01/27/2017   Procedure: REMOVAL PORT-A-CATH;  Surgeon: [name], MD;  Location: [facility];  Service: General;  Laterality: Right;  . TUBAL LIGATION    . WISDOM TOOTH EXTRACTION       OB History    Gravida  6   Para      Term      Preterm      AB  2   Living  4     SAB  2   TAB      Ectopic  Multiple      Live Births  4           Family History  Problem Relation Age of Onset  . Diabetes Mother   . Hypertension Mother   . Arthritis Mother   . Colon cancer Father 31       d.76 metastatic at time of diagnosis  . Diabetes Father   . Heart disease Father   . Hypertension Father   . Breast cancer Maternal Grandmother 78       d.60s  . Breast cancer Maternal Aunt 52  . Cervical cancer Maternal Aunt 38  . Breast cancer Maternal Aunt 46       d.50s  . Cancer Maternal Uncle        d.62s unspecified type of cancer  . Breast cancer Cousin 30       paternal first-cousin (daughter of unaffected aunt)  . Cancer Maternal Aunt 71        "Female Cancer"  . Cancer Maternal Aunt        unknown cancer    Social History   Tobacco Use  . Smoking status: Never Smoker  . Smokeless tobacco: Never Used  Substance Use Topics  . Alcohol use: No  . Drug use: No    Home Medications Prior to Admission medications   Medication Sig Start Date End Date Taking? Authorizing Provider  gabapentin (NEURONTIN) 100 MG capsule TAKE 2 CAPSULES BY MOUTH ONCE A DAY 02/22/19  Yes Magrinat, [name], MD  gabapentin (NEURONTIN) 300 MG capsule Take 2 capsules (600 mg total) by mouth at bedtime. 08/10/18  Yes Magrinat, [name], MD  HYDROcodone-acetaminophen [facility]) 5-325 MG tablet 1/2 to 1 tablet Q 6 hours prn pain 12/14/17   Magrinat, [name], MD  ibuprofen (ADVIL,MOTRIN) 200 MG tablet Take 1-2 tablets (200-400 mg total) by mouth every 8 (eight) hours as needed. 02/10/17   Magrinat, [name], MD  ondansetron (ZOFRAN) 4 MG tablet Take 1 tablet (4 mg total) by mouth every 8 (eight) hours as needed for nausea or vomiting. 01/18/19   Hayden Rasmussen, MD  oxyCODONE-acetaminophen (PERCOCET/ROXICET) 5-325 MG tablet Take 1-2 tablets by mouth every 6 (six) hours as needed for severe pain. 01/18/19   Hayden Rasmussen, MD  tamsulosin (FLOMAX) 0.4 MG CAPS capsule Take 1 capsule (0.4 mg total) by mouth daily. 01/18/19   Hayden Rasmussen, MD    Allergies    Carboplatin  Review of Systems   Review of Systems  Constitutional: Negative for chills and fever.  HENT: Negative for ear pain and sore throat.   Eyes: Negative for pain and visual disturbance.  Respiratory: Positive for chest tightness. Negative for cough and shortness of breath.   Cardiovascular: Negative for chest pain and palpitations.  Gastrointestinal: Negative for abdominal pain and vomiting.  Genitourinary: Negative for dysuria and hematuria.  Musculoskeletal: Negative for arthralgias and back pain.  Skin: Negative for color change and rash.  Neurological: Negative for seizures and syncope.   All other systems reviewed and are negative.   Physical Exam Updated Vital Signs BP 107/68   Pulse 72   Temp 99.1 F (37.3 C) (Oral)   Resp 20   Ht 5' 7.5" (1.715 m)   Wt 115.7 kg   SpO2 99%   BMI 39.35 kg/m   Physical Exam Vitals and nursing note reviewed.  Constitutional:      General: She is not in acute distress.    Appearance: She is well-developed.  HENT:     Head: Normocephalic and atraumatic.  Eyes:     Conjunctiva/sclera: Conjunctivae normal.  Cardiovascular:     Rate and Rhythm: Normal rate and regular rhythm.     Heart sounds: Normal heart sounds. No murmur.  Pulmonary:     Effort: Pulmonary effort is normal. No respiratory distress.     Breath sounds: Normal breath sounds.  Abdominal:     Palpations: Abdomen is soft.     Tenderness: There is no abdominal tenderness.  Musculoskeletal:        General: Normal range of motion.     Cervical back: Neck supple.     Right lower leg: No edema.     Left lower leg: No edema.  Skin:    General: Skin is warm and dry.  Neurological:     Mental Status: She is alert.     ED Results / Procedures / Treatments   Labs (all labs ordered are listed, but only abnormal results are displayed) Labs Reviewed  BASIC METABOLIC PANEL - Abnormal; Notable for the following components:      Result Value   Potassium 3.4 (*)    Glucose, Bld 101 (*)    Calcium 8.7 (*)    All other components within normal limits  CBC - Abnormal; Notable for the following components:   HCT 35.5 (*)    All other components within normal limits  D-DIMER, QUANTITATIVE (NOT AT [facility])  TROPONIN I (HIGH SENSITIVITY)  TROPONIN I (HIGH SENSITIVITY)    EKG EKG Interpretation  Date/Time:  Tuesday May 23 2019 18:37:55 EDT Ventricular Rate:  91 PR Interval:  152 QRS Duration: 72 QT Interval:  338 QTC Calculation: 415 R Axis:   21 Text Interpretation: Sinus rhythm with Premature atrial complexes Otherwise normal ECG Confirmed by Madalyn Rob  [phone]) on 05/23/2019 7:06:08 PM   Radiology DG Chest 2 View  Result Date: 05/23/2019 CLINICAL DATA:  Left-sided chest pain. EXAM: CHEST - 2 VIEW COMPARISON:  December 07, 2016 FINDINGS: The right-sided venous Port-A-Cath seen on the prior study has been removed. There is no evidence of acute infiltrate, pleural effusion or pneumothorax. The heart size and mediastinal contours are within normal limits. The visualized skeletal structures are unremarkable. IMPRESSION: No active cardiopulmonary disease. Electronically Signed   By: Virgina Norfolk M.D.   On: 05/23/2019 19:01    Procedures Procedures (including critical care time)  Medications Ordered in ED Medications  ondansetron (ZOFRAN) injection 4 mg (4 mg Intravenous Given 05/23/19 2126)    ED Course  I have reviewed the triage vital signs and the nursing notes.  Pertinent labs & imaging results that were available during my care of the patient were reviewed by me and considered in my medical decision making (see chart for details).    MDM Rules/Calculators/A&P                      49 year old lady history of breast cancer in remission, positive family history heart disease presenting to ER with concern for new onset chest heaviness.  In ER, patient noted to be well-appearing with stable vital signs.  Symptoms were spontaneously resolving.  EKG without ischemic change, troponin x2 grossly within normal limits.  Doubt ACS.  The vital signs were normal, given her CA history, check D-dimer, within normal limits, doubt PE.  CXR negative.  Her symptoms have resolved and she remains well-appearing on reassessment.  Discussed with patient that unclear what exactly was causing her symptoms  today however given the a forementioned work-up, believe that patient is stable and appropriate for discharge and outpatient management.  Stressed that she should return for recurrence or worsening pain.  Given her family history of heart disease, recommended  that she have close follow-up either with her cardiologist that she had seen previously or with her primary doctor to further discuss the symptoms that she experienced today, consideration for additional outpatient testing.  Discharged home.    After the discussed management above, the patient was determined to be safe for discharge.  The patient was in agreement with this plan and all questions regarding their care were answered.  ED return precautions were discussed and the patient will return to the ED with any significant worsening of condition.  Final Clinical Impression(s) / ED Diagnoses Final diagnoses:  Chest pain, unspecified type    Rx / DC Orders ED Discharge Orders    None       Lucrezia Starch, MD 05/24/19 [phone]

## 2019-05-23 NOTE — Discharge Instructions (Signed)
Recommend follow-up both with the cardiology as well as her primary care doctor.  If you have recurrence of your chest pain, difficulty in breathing, I would recommend coming back to the ER for reassessment.

## 2019-05-23 NOTE — ED Notes (Signed)
IV attempted x1 to R arm. Noted blood return to be bright red and pulsating. IV immediately removed and pressure applied to the area. Pressure dressing placed and peripheral pulses assessed. 2+ radial pulse present.

## 2019-05-23 NOTE — ED Triage Notes (Signed)
Chest pain for an hour with pain in her left arm.

## 2019-07-03 ENCOUNTER — Other Ambulatory Visit: Payer: Self-pay | Admitting: *Deleted

## 2019-07-03 ENCOUNTER — Other Ambulatory Visit: Payer: Self-pay | Admitting: Oncology

## 2019-07-03 DIAGNOSIS — Z171 Estrogen receptor negative status [ER-]: Secondary | ICD-10-CM

## 2019-07-03 DIAGNOSIS — C50412 Malignant neoplasm of upper-outer quadrant of left female breast: Secondary | ICD-10-CM

## 2019-07-03 MED FILL — GABAPENTIN 100 MG CAPSULE: 100 | 30 days supply | Qty: 60 | Fill #0

## 2019-07-03 MED FILL — GABAPENTIN 300 MG CAPSULE: 300 | 45 days supply | Qty: 90 | Fill #0

## 2019-09-13 MED FILL — GABAPENTIN 300 MG CAPSULE: 300 | 45 days supply | Qty: 90 | Fill #1

## 2019-09-18 ENCOUNTER — Other Ambulatory Visit: Payer: Self-pay | Admitting: Oncology

## 2019-09-18 DIAGNOSIS — Z853 Personal history of malignant neoplasm of breast: Secondary | ICD-10-CM

## 2019-09-25 NOTE — Progress Notes (Signed)
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 The change in variant classification was made as a result of re-review of the evidence in light of new variant interpretation guidelines and/or new information.

## 2019-09-26 ENCOUNTER — Telehealth: Payer: Self-pay | Admitting: Genetic Counselor

## 2019-09-26 NOTE — Telephone Encounter (Signed)
LM on VM that I had an update to her genetic testing from 2018 and to please give me a call.  Left CB instructions.

## 2019-09-27 ENCOUNTER — Encounter: Payer: Self-pay | Admitting: Genetic Counselor

## 2019-10-02 ENCOUNTER — Inpatient Hospital Stay: Payer: Medicaid Other | Attending: Genetic Counselor | Admitting: Genetic Counselor

## 2019-10-02 ENCOUNTER — Encounter: Payer: Self-pay | Admitting: Genetic Counselor

## 2019-10-02 ENCOUNTER — Telehealth: Payer: Self-pay | Admitting: *Deleted

## 2019-10-02 ENCOUNTER — Other Ambulatory Visit: Payer: Self-pay

## 2019-10-02 ENCOUNTER — Ambulatory Visit
Admission: RE | Admit: 2019-10-02 | Discharge: 2019-10-02 | Disposition: A | Discharge status: Home / Self Care | Payer: Medicaid Other | Source: Ambulatory Visit | Attending: Oncology | Admitting: Oncology

## 2019-10-02 DIAGNOSIS — Z808 Family history of malignant neoplasm of other organs or systems: Secondary | ICD-10-CM | POA: Diagnosis not present

## 2019-10-02 DIAGNOSIS — Z803 Family history of malignant neoplasm of breast: Secondary | ICD-10-CM | POA: Diagnosis not present

## 2019-10-02 DIAGNOSIS — Z800 Family history of malignant neoplasm of digestive organs: Secondary | ICD-10-CM | POA: Diagnosis not present

## 2019-10-02 DIAGNOSIS — Z8049 Family history of malignant neoplasm of other genital organs: Secondary | ICD-10-CM | POA: Insufficient documentation

## 2019-10-02 DIAGNOSIS — Z853 Personal history of malignant neoplasm of breast: Secondary | ICD-10-CM

## 2019-10-02 DIAGNOSIS — Z1379 Encounter for other screening for genetic and chromosomal anomalies: Secondary | ICD-10-CM

## 2019-10-02 MED FILL — GABAPENTIN 100 MG CAPSULE: 100 | 30 days supply | Qty: 60 | Fill #1

## 2019-10-02 MED FILL — GABAPENTIN 300 MG CAPSULE: 300 | 45 days supply | Qty: 90 | Fill #1

## 2019-10-02 NOTE — Telephone Encounter (Signed)
VM left by pt requesting a return call " I spoke with genetics and had my mammogram today - I am scheduled to be seen in a few weeks but just would like to speak to you and get some reassurance"  This RN returned call- obtained identified VM- message left informing pt to call again for further discussion.

## 2019-10-03 NOTE — Progress Notes (Signed)
GENETIC TEST RESULTS   Patient Name: [name] Patient Age: 49 y.o. Encounter Date: 10/02/2019  Referring Provider: Lurline Del, MD    [name] was seen in the [facility] on October 02, 2019 due to a personal and family history of cancer and concern regarding a hereditary predisposition to cancer in the family. Please refer to the prior Genetics clinic note for more information regarding [name]'s medical and family histories and our assessment at the time.   FAMILY HISTORY:  We obtained a detailed, 4-generation family history.  Significant diagnoses are listed below: Family History  Problem Relation Age of Onset  . Diabetes Mother   . Hypertension Mother   . Arthritis Mother   . Colon cancer Father 34       d.76 metastatic at time of diagnosis  . Diabetes Father   . Heart disease Father   . Hypertension Father   . Breast cancer Maternal Grandmother 79       d.60s  . Breast cancer Maternal Aunt 52  . Cervical cancer Maternal Aunt 65  . Breast cancer Maternal Aunt 46       d.50s  . Cancer Maternal Uncle        d.62s unspecified type of cancer  . Breast cancer Cousin 53       paternal first-cousin (daughter of unaffected aunt)  . Cancer Maternal Aunt 25       "Female Cancer"  . Cancer Maternal Aunt        unknown cancer  . Brain cancer Daughter 41   This information is updated with information about the patient's mothers family history:  [name] has three daughters, ages 65, 77, and 84, and one son, age 55. None have had cancers or tumors. [name] has a brother and a sister. They are twins to each other and are currently 106. Neither have had cancer, though she notes her sister has a long history of abnormal pap tests.  [name] mother is 38 without cancers. [name] had four maternal uncles and six maternal aunts who lived to adulthood. Two maternal aunts died as infants. All of her uncles are deceased. One had an  unspecified form of cancer and died in his early-60's. All but one of her aunts is deceased. Her only living aunt, [name], had breast cancer in her early-50's and cervical cancer shortly after. [name] is currently 68. Another aunt, [name], was diagnosed with breast cancer at 17 and died in her 45's, an aunt, [name] died at age 38 from a "female cancer", and a fourth aunt had an unknown cancer.  One aunt did not have cancer. [name] maternal grandmother was diagnosed with breast cancer at 20 and died in her 31's. [name] maternal grandfather left the family when her mother was very young, so she has no further information about him.  [name] father recently died from colon cancer at age 62. She reports that his cancer was metastatic at the time of diagnosis and her father refused treatment. [name] only paternal uncle died at birth. Her only paternal aunt is 76 without cancers. This aunt's daughter, [name], was diagnosed with breast cancer around the age of 35 and is currently in her late-50's. [name] paternal grandmother died at 10 without cancer. Her paternal grandfather died at 13 without cancer. This grandfather's mother had tongue cancer and died in her 18's.  SarahKhan unawareof previous family history of genetic testing for hereditary cancer risks. Patient's  maternal ancestors are of Caucasian and Native Research scientist (physical sciences), and paternal ancestors are of [place]. There is noreported Ashkenazi Jewish ancestry. There is noknown consanguinity.  GENETIC TESTING:  At the time of [name]'s visit, we recommended she pursue genetic testing of the common hereditary cancer test. The original report indicated that she had a BRCA2 and MSH2 VUS.  On September 25, 2019, the BRCA2 VUS was upgraded to Likely pathogenic as a result of re-review of the evidence in light of new variant interpretation guidelines and/or new information. 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 There were no deleterious mutations in APC, ATM, AXIN2, BARD1, BMPR1A, BRCA1, BRCA2, BRIP1, CDH1, CDK4, CDKN2A, CHEK2, CTNNA1, DICER1, EPCAM, GREM1, HOXB13, KIT, MEN1, MLH1, MSH2, MSH3, MSH6, MUTYH, NBN, NF1, NTHL1, PALB2, PDGFRA, PMS2, POLD1, POLE, PTEN, RAD50, RAD51C, RAD51D, SDHA, SDHB, SDHC, SDHD, SMAD4, SMARCA4. STK11, TP53, TSC1, TSC2, and VHL .    Genetic testing did identify a variant of uncertain significance (VUS) was identified in the MSH2 gene called c.2050G>T.  At this time, it is unknown if this variant is associated with increased cancer risk or if this is a normal finding, but most variants such as this get reclassified to being inconsequential. It should not be used to make medical management decisions. With time, we suspect the lab will determine the significance of this variant, if any. If we do learn more about it, we will try to contact [name] to discuss it further. However, it is important to stay in touch with [place] periodically and keep the address and phone number up to date.  CLINICAL CONDITION: The average woman's lifetime risk of developing breast cancer is 12%; her risk for developing ovarian cancer is 1.3% (SEER database 2014. https://seer.ShavedPoints.is. Accessed September 2017). Most cases of these cancers are sporadic and are not due to hereditary factors, but approximately 5-10% of breast and ovarian cancer cases are hereditary and due to an identifiable pathogenic variant in a disease-causing gene. Hereditary breast and ovarian cancer syndrome (HBOC) due to pathogenic variants in the BRCA1 and BRCA2 genes accounts for the majority of hereditary breast and ovarian cancer cases in individuals with a strong family history or an early-onset diagnosis.  HBOC syndrome is characterized by an increased lifetime risk for generally  adult-onset cancers including breast, contralateral breast, female breast, ovarian, prostate, and pancreatic (PMID: 16109604).  The cancers associated with BRCA1 are:  Marland Kitchen Breast cancer, up to a 84% risk  . Ovarian cancer, up to a 27% risk  . Pancreatic cancer, 2-7%  . Prostate cancer, elevated  . Melanoma, elevated  INHERITANCE: Hereditary predisposition to cancer due to pathogenic variants in the BRCA2 gene has autosomal dominant inheritance. This means that an individual with a pathogenic variant has a 50% chance of passing the condition on to his/her offspring. Most cases are inherited from a parent, but some cases may occur spontaneously (i.e., an individual with a pathogenic variant has parents who do not have it). Identification of a pathogenic variant allows for the recognition of at-risk relatives who can pursue testing for the familial variant.  Individuals with a single pathogenic BRCA2 variant are also carriers of autosomal recessive Fanconi anemia. Fanconi anemia is characterized by bone marrow failure with variable additional anomalies, which often include short stature, abnormal skin pigmentation, abnormal thumbs, malformations of the skeletal and central nervous systems, and developmental delay (PMID: 5409811, 91478295). Risk of  leukemia and early-onset solid tumors is significantly elevated with this disorder (PMID: 98264158, 30940768, 08811031). For there to be a risk of Fanconi anemia in offspring, both the patient and their partner would each have to carry a pathogenic variant in BRCA2; in this case, the risk to have an affected child is 25%.  MANAGEMENT Management Guidelines for individuals with pathogenic BRCA2 variants have been developed by the [employer]  (NCCN):  NCCN cancer surveillance:  Females:  . Breast awareness starting at age 22 . Clinical breast exams every 6-12 months beginning at 50 years of age or at the age of the earliest  diagnosed breast cancer in the family, if below age 72 . Annual breast MRIs with contrast beginning between the ages of 25 and 30 (or annual mammograms with consideration of tomosynthesis if MRI is unavailable), although the age to initiate screening may be individualized based on family history . Annual breast MRI with contrast and annual mammography with consideration of tomosynthesis between the ages of 71 and 63 . After age 65, management should be considered on an individual basis. . For women treated for breast cancer, screening of remaining breast tissue with annual mammography and breast MRI should continue. . Consider risk-reducing mastectomy and counsel regarding degree of protection, degree of cancer risk, and reconstruction options. o Address the psychosocial, social, and quality-of-life aspects of undergoing risk-reducing mastectomy. . Ms. Towles will discuss RRM with [name] at her next appointment. . Recommend risk-reducing salpingo-oophorectomy, typically between age 29 and 4 years and upon the completion of childbearing. See specific Risk-Reducing Salpingo-Oophorectomy (RRSO) Protocol in NCCN Guidelines for Ovarian Cancer - Principles of Surgery. . The patient does not have a GYN that she sees regularly.  We provided her the name of some GYN practices in the are she may look into. We also recommended that Ms. Odonoghue speak with [name] about who he may recommend. . Counseling includes a discussion of reproductive desires, extent of cancer risk, degree of protection for breast and ovarian cancer, management of menopausal symptoms, possible short-term hormonal replacement and medical issues. . Salpingectomy alone is not the standard of care for risk reduction, although clinical trials of interval salpingectomy and delayed oophorectomy are ongoing. The concern for risk reducing salpingectomy alone is that women are still at risk for developing ovarian cancer. In addition, in  pre-menopausal women, oophorectomy likely reduces the risk of developing breast cancer, but the magnitude is uncertain and may be gene specific.   . For those patients who have not elected RRSO, transvaginal ultrasound combined with serum CA-125 for ovarian cancer screening has not been shown to be sufficient sensitive or specific as to support a positive recommendation, but, although of uncertain benefit, it may be considered at the clinician's discretion starting at age 5-35 years. . Consider risk-reducing agents as options for breast and ovarian cancer.  Males:  . Breast self-exam training and education starting at age 99 years . Clinical breast exam every 12 months, beginning at age 70 years . Consider prostate-cancer screening beginning at age 68 years.  Pancreatic cancer: NCCN, as well as the [employer] of Gastroenterology Clinical Guidelines cites sufficient evidence to warrant screening for pancreatic cancer (NCCN. Genetic/Familial High-Risk Assessment: Breast and Ovarian. Version 1.2021). Recommendations carriers be limited to those with a first- or second-degree relative affected with pancreatic cancer. Ideally, screening should be performed in experienced centers utilizing a multidisciplinary approach under research conditions. Recommended screening includes annual endoscopic ultrasound and/or MRI of the pancreas  starting at age 52 or 24 years younger than the earliest age of pancreatic cancer diagnosis in the family (PMID: 78588502).  Additional risk reduction and management considerations:  . Chemoprevention can reduce the risk of breast cancer in the contralateral breast in women with BRCA1 and BRCA2 mutations who have been diagnosed with breast cancer (PMID: 7741287, 86767209). . Oral contraceptive use has been shown to reduce the risk of ovarian cancer by approximately 60% in BRCA mutation carriers if taken for at least 5 years (PMID: 4709628). . Recent studies have some  preliminary data that suggest PARP inhibitors may be a beneficial chemotherapeutic agent for a subset of patients with BRCA-associated breast and ovarian cancers. Clinical trials are currently in process to determine if and how these agents can be useful in the treatment of BRCA cancer patients (PMID: 36629476).  An individual's cancer risk and medical management are not determined by genetic test results alone. Overall cancer risk assessment incorporates additional factors including personal medical history, family history, as well as available genetic information that may result in a personalized plan for cancer prevention and surveillance.  FAMILY MEMBERS: It is important that all of Ms. Urias's relatives (both men and women) know of the presence of this gene mutation. Site-specific genetic testing can sort out who in the family is at risk and who is not. It is advantageous to know if a BRCA2 pathogenic variant is present as medical management recommendations can be implemented. At-risk relatives can be identified, allowing pursuit of a diagnostic evaluation. In addition, the available information regarding hereditary cancer susceptibility genes is constantly evolving and more clinically relevant BRCA2 data is likely to become available in the near future. Awareness of this cancer predisposition allows patients and their providers to be vigilant in maintaining close and regular contact with their local genetics clinic in anticipation of new information, inform at-risk family members, and diligently follow condition-specific screening protocols.  Ms. Bahner children and siblings have a 50% chance to have inherited this mutation. We recommend that all individuals over age 39 have genetic testing for this same mutation.  One daughter, however, is relatively young and this will not be of any consequence to her for several years. We do not test children because there is no risk to them until they are  adults. We recommend they have genetic counseling and testing by the time they are in their early 20s.    SUPPORT AND RESOURCES: If Ms. Pugsley is interested in BRCA-specific information and support, there is a  Physicist, medical Our Risk (www.facingourrisk.com) which some people have found useful. Another group that we just learned about is called Nothing Pink (www.nothingpink.org).  This group provides support both peer and financial, to individuals with hereditary breast cancer gene mutations.  To locate genetic counselors in other cities, visit the website of the [employer] (ArtistMovie.se) and Secretary/administrator for a Social worker by zip code.  We encouraged Ms. Wince to remain in contact with [place] on an annual basis so we can update her personal and family histories, and let her know of advances in cancer genetics that may benefit the family. Our contact number was provided. Ms. Pikus questions were answered to her satisfaction today, and she knows she is welcome to call anytime with additional questions.   Anjulie Dipierro P. [place], [place], [facility] Licensed, Insurance risk surveyor [email or web address] phone: [phone]  The patient was seen for a total of 35 minutes in face-to-face genetic counseling.

## 2019-10-13 ENCOUNTER — Other Ambulatory Visit: Payer: Self-pay

## 2019-10-13 DIAGNOSIS — C50412 Malignant neoplasm of upper-outer quadrant of left female breast: Secondary | ICD-10-CM

## 2019-10-13 DIAGNOSIS — Z171 Estrogen receptor negative status [ER-]: Secondary | ICD-10-CM

## 2019-10-15 NOTE — Progress Notes (Signed)
[place]  [phone] [phone]     ID: [name] DOB: [birth year]  MR#: [id number]  RAQ#:[id number]  Patient Care Team: [name], MD as PCP - General (Internal Medicine) [name], MD as Consulting Physician (General Surgery) Acsa Estey, [name], MD as Consulting Physician (Oncology) [name], MD as Attending Physician (Radiation Oncology) Delice Bison Charlestine Massed, NP as Nurse Practitioner (Hematology and Oncology) OTHER MD:  CHIEF COMPLAINT: Triple negative breast cancer  CURRENT TREATMENT: Observation   INTERVAL HISTORY: [name] returns today for a follow-up of her triple negative breast cancer. She continues under observation.   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  She is here today to discuss the implications of that  REVIEW OF SYSTEMS: [name] is doing generally well overall.  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  She frequently feels like she is 49 and does think the chemotherapy aged her.  She is currently not having hot flashes or other significant menopausal symptoms however.  She never recovered ovarian function after chemotherapy.  A detailed review of systems was otherwise stable  BREAST CANCER HISTORY: From the original intake note:  The patient was seen in the emergency room 05/16/2016 with nonspecific complaints but also noting that she had a tender lump in her lateral left breast which she said she had noted the day before. Exam by the emergency room physician confirmed a 3 cm soft left lateral breast mass without overlying erythema or nipple changes. This was felt to be most consistent with fibrocystic change but the patient was referred back to her primary care physician for further evaluation. She saw the physician assistant in May 18 and then Dr. Wynetta Emery on 06/12/2016 who  scheduled a bilateral diagnostic mammography with tomography and left breast ultrasonography at the [facility] 06/18/2016. This found the breast density to be category B. In the left breast upper outer quadrant there was an area of asymmetry measuring up to 10.3 cm. On exam this was firm and palpable and on ultrasound there was an irregular mass measuring 2.5 cm with some subtle changes in the surrounding tissue. In the left lower axilla there were 2 suspicious-looking lymph nodes.  Biopsy of the left breast upper outer quadrant and one of the suspicious lymph nodes 06/19/2016 showed ([id number]) both to be involved by invasive ductal carcinoma, grade 3, estrogen and progesterone receptor negative, HER-2 not amplified with a signals ratio being 1.53-1.74 and the number per cell 2.35-3.05. The MIB-1 was 90%.  The patient's subsequent history is as detailed below.   PAST MEDICAL HISTORY: Past Medical History:  Diagnosis Date  . Breast cancer ([place])    Left Breast Cancer  . Complication of anesthesia    DAY SURGERY 2010 OR 2011 ASPIRIATED AND STAYED OVERNIGHT  . History of radiation therapy 03/11/17- 04/21/17   Left breast, 2 Gy in 25 fractions for a total dose of 50 Gy, Boost, 2 Gy in 5 fractions for a total dose of 10 Gy  . Hypertension    OFF MEDS  SINCE CHEMO X 5 MONTHS  . Malignant neoplasm of upper-outer quadrant of left breast in female, estrogen receptor negative ([place]) 06/23/2016  . Personal history of chemotherapy 2019   Left Breast Cancer  . Personal history of radiation therapy 2019   Left Breast Cancer    PAST SURGICAL HISTORY: Past Surgical History:  Procedure Laterality Date  . ANKLE SURGERY    . BREAST LUMPECTOMY Left 01/27/2017  . BREAST LUMPECTOMY WITH RADIOACTIVE SEED AND SENTINEL LYMPH NODE BIOPSY Left 01/27/2017   Procedure: LEFT BREAST LUMPECTOMY WITH RADIOACTIVE SEED LOCALIZATION, LEFT AXILLARY DEEP LYMPH NODE BIOPSY WITH RADIOACTIVE SEED LOCALIZATION, LEFT AXILLARY  SENTINEL LYMPH NODE MAPPING AND BIOPSY WITH BLUE DYE INJECTION;  Surgeon: [name], MD;  Location: [facility];  Service: General;  Laterality: Left;  . IR FLUORO GUIDE PORT INSERTION RIGHT  07/14/2016  . IR US GUIDE VASC ACCESS RIGHT  07/14/2016  . PORT-A-CATH REMOVAL Right 01/27/2017   Procedure: REMOVAL PORT-A-CATH;  Surgeon: [name], MD;  Location: [facility];  Service: General;  Laterality: Right;  . TUBAL LIGATION    . WISDOM TOOTH EXTRACTION      FAMILY HISTORY Family History  Problem Relation Age of Onset  . Diabetes Mother   . Hypertension Mother   . Arthritis Mother   . Colon cancer Father 34       d.76 metastatic at time of diagnosis  . Diabetes Father   . Heart disease Father   . Hypertension Father   . Breast cancer Maternal Grandmother 15       d.60s  . Breast cancer Maternal Aunt 52  . Cervical cancer Maternal Aunt 54  . Breast cancer Maternal Aunt 46       d.50s  . Cancer Maternal Uncle        d.62s unspecified type of cancer  . Breast cancer Cousin 82       paternal first-cousin (daughter of unaffected aunt)  . Cancer Maternal Aunt 13       "Female Cancer"  . Cancer Maternal Aunt        unknown cancer  . Brain cancer Daughter 70  The patient's father died at age 76 from colon cancer which she never had treated. The patient's mother is living, 77 years old as of June 2018. The patient has one brother, one sister. On the mother's side there is a history of breast cancer in the grandmother, age 31, and then 1 aunt with breast cancer age 30, and another with lung cancer age 40. There is no history of ovarian cancer in the family.   GYNECOLOGIC HISTORY:  No LMP recorded. (Menstrual status: Other). Menarche age 49, first live birth age 28. She is GX P4. Her periods have never been regular. She had a period in June 2018 but not for 4 months before that. She took birth control for some years remotely with no complications. She is status post bilateral tubal  ligation.   SOCIAL HISTORY:  The patient's husband owns and runs a food truck.. They serve mostly [place] and [place] food. 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 Her husband Marianna Cid (goes by "[name]") is originally from [name]. The patient has two children from her first marriage. Her son [name], who is 70 years old as of Oct 2021, was previously in the [employer], but now is taking courses at [employer] and  her 62 y/o daughter [name], who was studying psychology at [name] before her optic pathway germinoma, which has left her 97% blind.  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 Smoking status: Never Smoker  . Smokeless tobacco: Never Used  Vaping Use  . Vaping Use: Never used  Substance Use Topics  . Alcohol use: No  . Drug use: No     Colonoscopy: Referral placed 10/16/2019  HKV:[id number] 2018  Bone density:Never   Allergies  Allergen Reactions  . Carboplatin Other (See Comments)    Red, blotchy, hot    Current Outpatient Medications  Medication Sig Dispense Refill  . gabapentin (NEURONTIN) 100 MG capsule TAKE 2 CAPSULES BY MOUTH ONCE A DAY 60 capsule 1  . gabapentin (NEURONTIN) 300 MG capsule TAKE 2 CAPSULES (600 MG TOTAL) BY MOUTH AT BEDTIME. 90 capsule 4  . HYDROcodone-acetaminophen (NORCO) 5-325 MG tablet 1/2 to 1 tablet Q 6 hours prn pain 25 tablet 0  . ibuprofen (ADVIL,MOTRIN) 200 MG tablet Take 1-2 tablets (200-400 mg total) by mouth every 8 (eight) hours as needed.    . ondansetron (ZOFRAN) 4 MG tablet Take 1 tablet (4 mg total) by mouth every 8 (eight) hours as needed for nausea or vomiting. 20 tablet 0  .  oxyCODONE-acetaminophen (PERCOCET/ROXICET) 5-325 MG tablet Take 1-2 tablets by mouth every 6 (six) hours as needed for severe pain. 15 tablet 0  . tamsulosin (FLOMAX) 0.4 MG CAPS capsule Take 1 capsule (0.4 mg total) by mouth daily. 30 capsule 0   No current facility-administered medications for this visit.    OBJECTIVE: White woman in no acute distress  Vitals:   10/16/19 1410  BP: 113/87  Pulse: 94  Resp: 18  Temp: (!) 97.4 F (36.3 C)  SpO2: 99%     Body mass index is 40.98 kg/m.   Filed Weights   10/16/19 1410  Weight: 265 lb 9.6 oz (120.5 kg)   ECOG FS:1 - Symptomatic but completely ambulatory  Sclerae unicteric, EOMs intact Wearing a mask No cervical or supraclavicular adenopathy Lungs no rales or rhonchi Heart regular rate and rhythm Abd soft, nontender, positive bowel sounds MSK no focal spinal tenderness, no upper extremity lymphedema Neuro: nonfocal, well oriented, appropriate affect Breasts: The right breast is unremarkable.  The left breast is status post radiation and surgery.  There is no evidence of local recurrence.  Both axillae are benign.   LAB RESULTS:  CMP     Component Value Date/Time   NA 141 10/16/2019 1347   NA 140 01/07/2017 1342   K 3.2 (L) 10/16/2019 1347   K 3.5 01/07/2017 1342   CL 100 10/16/2019 1347   CO2 33 (H) 10/16/2019 1347   CO2 27 01/07/2017 1342   GLUCOSE 126 (H) 10/16/2019 1347   GLUCOSE 148 (H) 01/07/2017 1342   BUN 13 10/16/2019 1347   BUN 6.2 (L) 01/07/2017 1342   CREATININE 0.95 10/16/2019 1347   CREATININE 0.7 01/07/2017 1342   CALCIUM 9.9 10/16/2019 1347   CALCIUM 9.4 01/07/2017 1342   PROT 7.7 10/16/2019 1347   PROT 7.0 01/07/2017 1342   ALBUMIN 3.8 10/16/2019 1347   ALBUMIN 3.7 01/07/2017 1342   AST 16 10/16/2019 1347   AST 23 01/07/2017 1342   ALT 17 10/16/2019 1347   ALT 24 01/07/2017 1342   ALKPHOS 95 10/16/2019 1347   ALKPHOS 105 01/07/2017 1342   BILITOT 0.8 10/16/2019 1347   BILITOT 0.84 01/07/2017  1342   GFRNONAA >60 10/16/2019 1347   GFRAA >60 05/23/2019 1921   GFRAA >60 10/13/2018 1346    No results found for:  TOTALPROTELP, ALBUMINELP, A1GS, A2GS, BETS, BETA2SER, GAMS, MSPIKE, SPEI  No results found for: Nils Pyle, [facility]  Lab Results  Component Value Date   WBC 6.1 10/16/2019   NEUTROABS 3.5 10/16/2019   HGB 13.5 10/16/2019   HCT 39.9 10/16/2019   MCV 88.9 10/16/2019   PLT 200 10/16/2019      Chemistry      Component Value Date/Time   NA 141 10/16/2019 1347   NA 140 01/07/2017 1342   K 3.2 (L) 10/16/2019 1347   K 3.5 01/07/2017 1342   CL 100 10/16/2019 1347   CO2 33 (H) 10/16/2019 1347   CO2 27 01/07/2017 1342   BUN 13 10/16/2019 1347   BUN 6.2 (L) 01/07/2017 1342   CREATININE 0.95 10/16/2019 1347   CREATININE 0.7 01/07/2017 1342      Component Value Date/Time   CALCIUM 9.9 10/16/2019 1347   CALCIUM 9.4 01/07/2017 1342   ALKPHOS 95 10/16/2019 1347   ALKPHOS 105 01/07/2017 1342   AST 16 10/16/2019 1347   AST 23 01/07/2017 1342   ALT 17 10/16/2019 1347   ALT 24 01/07/2017 1342   BILITOT 0.8 10/16/2019 1347   BILITOT 0.84 01/07/2017 1342       No results found for: LABCA2  No components found for: [id number]  No results for input(s): INR in the last 168 hours.  Urinalysis    Component Value Date/Time   COLORURINE YELLOW 01/18/2019 1100   APPEARANCEUR HAZY (A) 01/18/2019 1100   LABSPEC 1.025 01/18/2019 1100   LABSPEC 1.025 11/23/2016 1251   PHURINE 5.0 01/18/2019 1100   GLUCOSEU NEGATIVE 01/18/2019 1100   GLUCOSEU Negative 11/23/2016 1251   HGBUR LARGE (A) 01/18/2019 1100   BILIRUBINUR NEGATIVE 01/18/2019 1100   BILIRUBINUR Negative 11/23/2016 1251   KETONESUR NEGATIVE 01/18/2019 1100   PROTEINUR NEGATIVE 01/18/2019 1100   UROBILINOGEN 0.2 11/23/2016 1251   NITRITE NEGATIVE 01/18/2019 1100   LEUKOCYTESUR NEGATIVE 01/18/2019 1100   LEUKOCYTESUR Negative 11/23/2016 1251    STUDIES: MM DIAG BREAST TOMO  BILATERAL  Result Date: 10/02/2019 CLINICAL DATA:  49 year old female with history of left breast cancer status post lumpectomy and radiation. Patient presents for annual exam. No new problems today. EXAM: DIGITAL DIAGNOSTIC BILATERAL MAMMOGRAM WITH TOMO AND CAD COMPARISON:  Previous exam(s). ACR Breast Density Category b: There are scattered areas of fibroglandular density. FINDINGS: Right breast: No suspicious mass, distortion, or microcalcifications are identified to suggest presence of malignancy. Left breast: Spot 2D magnification views of the lumpectomy site were performed in addition to standard views. There are expected postsurgical changes including fat necrosis. No suspicious mass, distortion, or microcalcifications are identified to suggest presence of malignancy. Mammographic images were processed with CAD. IMPRESSION: Left breast postsurgical changes. No mammographic evidence of malignancy bilaterally. RECOMMENDATION: Diagnostic mammogram is suggested in 1 year. (Code:[id number]-B-01Y) I have discussed the findings and recommendations with the patient. If applicable, a reminder letter will be sent to the patient regarding the next appointment. BI-RADS CATEGORY  2: Benign. Electronically Signed   By: Audie Pinto M.D.   On: 10/02/2019 14:52     ELIGIBLE FOR AVAILABLE RESEARCH PROTOCOL: no   ASSESSMENT: 49 y.o. [place] woman status post left breast upper outer quadrant and left axillary lymph node biopsy 06/19/2016, both positive for a clinical T2-T3 N1, stage IIIB-C invasive ductal carcinoma, grade 3, triple negative, with an MIB-1 of 90%  (1) genetics testing 08/04/2016 showed a variant of uncertain significance in the BRCA2 namely c.[id number] (p.[id number]). This has been classified  as likely pathogenic by [place] [facility] but not other labs. Additional testing of the patient's mother and maternal aunt is pending. Otherwise [facility]'s Common Hereditary Cancers Panel found no deleterious mutations  in APC, ATM, AXIN2, BARD1, BMPR1A, BRCA1, BRCA2, BRIP1, CDH1, CDKN2A, CHEK2, CTNNA1, DICER1, EPCAM, GREM1, HOXB13, KIT, MEN1, MLH1, MSH2, MSH3, MSH6, MUTYH, NBN, NF1, NTHL1, PALB2, PDGFRA, PMS2, POLD1, POLE, PTEN, RAD50, RAD51C, RAD51D, SDHA, SDHB, SDHC, SDHD, SMAD4, SMARCA4, STK11, TP53, TSC1, TSC2, and VHL.  (2) neoadjuvant chemotherapy to consist of doxorubicin and cyclophosphamide in dose dense fashion 4 completed 09/10/2016 to be followed by paclitaxel weekly 12 given with carboplatin   (a) cycle 4 of cyclophosphamide and doxorubicin was delayed 10 days and dose decreased 10% because of febrile neutropenia after cycle 3  (b) cycle 6 of Paclitaxel and Carboplatin delayed due to neutropenia, therefore Granix added to Wednesday, Thursday, Friday following chemotherapy days  (3) status post left lumpectomy and left axillary sentinel lymph node sampling 01/27/2017 with pathology showing a complete pathologic response (ypT0 ypN0); 5 left axillary lymph nodes removed  (4) adjuvant radiation 03/11/2017-04/21/2017 Site/dose:   1. Left breast, 2 Gy in 25 fractions for a total dose of 50 Gy                      2. Boost, 2 Gy in 5 fractions for a total dose of 10 Gy  (5) BRCA2 positivity  (a) intensified screening for breast cancer initiated November 2021 with breast MRI  (b) referral to dermatology placed 10/16/2019 for melanoma screening  (c) referral to ophthalmology for yearly funduscopic exam  (d) referral to gynecologic oncology 10/16/2019 for BSO   PLAN: Liyat is now close to 3 years out from definitive surgery for her breast cancer with no evidence of disease recurrence.  That is very favorable.  We discussed her BRCA2 mutation in detail.  She understands we can screen for breast cancer and melanoma.  We discussed the possibility of bilateral mastectomies but she would prefer not to undergo further surgery and understands intensified screening is equally safe.  Are going to initiate yearly  breast MRI November 2021, alternating with mammography yearly in May.  She is being referred to [facility] dermatology for melanoma screening and she will be referred to ophthalmology for the same reason.  I have placed a referral to gynecology oncology for bilateral salpingo-oophorectomy to be performed sometime this year.  Finally she is also due for colonoscopy partly because of her age and partly because of a positive family history and that referral also has been placed.  Tentatively she will see me again after her May mammogram in 2022 but she knows to call if any of the referrals or tests above do not come through.  We also discussed testing of family members and she does plan to have her 2 older children tested within the next 2 or 3 months  Total encounter time 40 minutes.*  Tericka Devincenzi, [name], MD  10/16/19 3:10 PM Medical Oncology and Hematology [facility], [place] [address] Tel. [phone]    Fax. [phone]   I, Wilburn Mylar, am acting as scribe for Dr. [name]. Oria Klimas.  I, Lurline Del MD, have reviewed the above documentation for accuracy and completeness, and I agree with the above.   *Total Encounter Time as defined by the Centers for Medicare and Medicaid Services includes, in addition to the face-to-face time of a patient visit (documented in the note above)  non-face-to-face time: obtaining and reviewing outside history, ordering and reviewing medications, tests or procedures, care coordination (communications with other health care professionals or caregivers) and documentation in the medical record.

## 2019-10-16 ENCOUNTER — Inpatient Hospital Stay: Payer: Medicaid Other

## 2019-10-16 ENCOUNTER — Other Ambulatory Visit: Payer: Self-pay

## 2019-10-16 ENCOUNTER — Encounter: Payer: Self-pay | Admitting: Oncology

## 2019-10-16 ENCOUNTER — Inpatient Hospital Stay: Payer: Medicaid Other | Attending: Oncology | Admitting: Oncology

## 2019-10-16 VITALS — BP 113/87 | HR 94 | Temp 97.4°F | Resp 18 | Ht 67.5 in | Wt 265.6 lb

## 2019-10-16 DIAGNOSIS — Z1501 Genetic susceptibility to malignant neoplasm of breast: Secondary | ICD-10-CM | POA: Diagnosis not present

## 2019-10-16 DIAGNOSIS — Z1502 Genetic susceptibility to malignant neoplasm of ovary: Secondary | ICD-10-CM

## 2019-10-16 DIAGNOSIS — C50412 Malignant neoplasm of upper-outer quadrant of left female breast: Secondary | ICD-10-CM | POA: Diagnosis not present

## 2019-10-16 DIAGNOSIS — Z923 Personal history of irradiation: Secondary | ICD-10-CM | POA: Diagnosis not present

## 2019-10-16 DIAGNOSIS — Z171 Estrogen receptor negative status [ER-]: Secondary | ICD-10-CM

## 2019-10-16 DIAGNOSIS — Z1509 Genetic susceptibility to other malignant neoplasm: Secondary | ICD-10-CM

## 2019-10-16 DIAGNOSIS — Z148 Genetic carrier of other disease: Secondary | ICD-10-CM | POA: Diagnosis not present

## 2019-10-16 DIAGNOSIS — Z79899 Other long term (current) drug therapy: Secondary | ICD-10-CM | POA: Diagnosis not present

## 2019-10-16 DIAGNOSIS — Z08 Encounter for follow-up examination after completed treatment for malignant neoplasm: Secondary | ICD-10-CM | POA: Insufficient documentation

## 2019-10-16 DIAGNOSIS — Z853 Personal history of malignant neoplasm of breast: Secondary | ICD-10-CM | POA: Insufficient documentation

## 2019-10-16 LAB — CBC WITH DIFFERENTIAL (CANCER CENTER ONLY)
Abs Immature Granulocytes: 0.02 10*3/uL (ref 0.00–0.07)
Basophils Absolute: 0.1 10*3/uL (ref 0.0–0.1)
Basophils Relative: 1 %
Eosinophils Absolute: 0.1 10*3/uL (ref 0.0–0.5)
Eosinophils Relative: 2 %
HCT: 39.9 % (ref 36.0–46.0)
Hemoglobin: 13.5 g/dL (ref 12.0–15.0)
Immature Granulocytes: 0 %
Lymphocytes Relative: 32 %
Lymphs Abs: 1.9 10*3/uL (ref 0.7–4.0)
MCH: 30.1 pg (ref 26.0–34.0)
MCHC: 33.8 g/dL (ref 30.0–36.0)
MCV: 88.9 fL (ref 80.0–100.0)
Monocytes Absolute: 0.5 10*3/uL (ref 0.1–1.0)
Monocytes Relative: 7 %
Neutro Abs: 3.5 10*3/uL (ref 1.7–7.7)
Neutrophils Relative %: 58 %
Platelet Count: 200 10*3/uL (ref 150–400)
RBC: 4.49 MIL/uL (ref 3.87–5.11)
RDW: 13.4 % (ref 11.5–15.5)
WBC Count: 6.1 10*3/uL (ref 4.0–10.5)
nRBC: 0 % (ref 0.0–0.2)

## 2019-10-16 LAB — CMP (CANCER CENTER ONLY)
ALT: 17 U/L (ref 0–44)
AST: 16 U/L (ref 15–41)
Albumin: 3.8 g/dL (ref 3.5–5.0)
Alkaline Phosphatase: 95 U/L (ref 38–126)
Anion gap: 8 (ref 5–15)
BUN: 13 mg/dL (ref 6–20)
CO2: 33 mmol/L — ABNORMAL HIGH (ref 22–32)
Calcium: 9.9 mg/dL (ref 8.9–10.3)
Chloride: 100 mmol/L (ref 98–111)
Creatinine: 0.95 mg/dL (ref 0.44–1.00)
GFR, Estimated: >60 mL/min (ref >60)
Glucose, Bld: 126 mg/dL — ABNORMAL HIGH (ref 70–99)
Potassium: 3.2 mmol/L — ABNORMAL LOW (ref 3.5–5.1)
Sodium: 141 mmol/L (ref 135–145)
Total Bilirubin: 0.8 mg/dL (ref 0.3–1.2)
Total Protein: 7.7 g/dL (ref 6.5–8.1)

## 2019-10-16 NOTE — Progress Notes (Signed)
Referral to [facility] Dermatology successfully faxed to [phone].  Referral to [facility] Ophthalmology successfully faxed to [phone].

## 2019-10-17 ENCOUNTER — Telehealth: Payer: Self-pay | Admitting: *Deleted

## 2019-10-17 NOTE — Telephone Encounter (Signed)
Called and scheduled the patient for a new patient appt with Dr Denman George for 11/2

## 2019-10-18 ENCOUNTER — Telehealth: Payer: Self-pay | Admitting: Oncology

## 2019-10-18 NOTE — Telephone Encounter (Signed)
Scheduled per 10/11 los. Called and spoke with pt confirmed 1/25 appts

## 2019-10-24 ENCOUNTER — Other Ambulatory Visit: Payer: Self-pay | Admitting: Oncology

## 2019-10-24 DIAGNOSIS — Z171 Estrogen receptor negative status [ER-]: Secondary | ICD-10-CM

## 2019-10-24 DIAGNOSIS — C50412 Malignant neoplasm of upper-outer quadrant of left female breast: Secondary | ICD-10-CM

## 2019-10-24 NOTE — Progress Notes (Signed)
rfer GI

## 2019-10-26 NOTE — Progress Notes (Signed)
Clinic notified that referral was received for dermatology however they do not accept patient's insurance.   RN called to dermatology clinics - [facility] in [place] accepts insurance for dermatology clinic.   Referral successfully faxed to [phone].

## 2019-10-30 ENCOUNTER — Encounter: Payer: Self-pay | Admitting: Oncology

## 2019-10-30 ENCOUNTER — Encounter: Payer: Self-pay | Admitting: Gastroenterology

## 2019-11-07 ENCOUNTER — Other Ambulatory Visit: Payer: Self-pay | Admitting: Gynecologic Oncology

## 2019-11-07 ENCOUNTER — Encounter: Payer: Self-pay | Admitting: Gynecologic Oncology

## 2019-11-07 ENCOUNTER — Inpatient Hospital Stay: Payer: Medicaid Other | Attending: Oncology | Admitting: Gynecologic Oncology

## 2019-11-07 ENCOUNTER — Inpatient Hospital Stay: Payer: Medicaid Other

## 2019-11-07 ENCOUNTER — Other Ambulatory Visit: Payer: Self-pay

## 2019-11-07 VITALS — BP 126/76 | HR 79 | Temp 97.2°F | Resp 18 | Ht 67.5 in | Wt 264.0 lb

## 2019-11-07 DIAGNOSIS — E669 Obesity, unspecified: Secondary | ICD-10-CM | POA: Diagnosis not present

## 2019-11-07 DIAGNOSIS — Z1502 Genetic susceptibility to malignant neoplasm of ovary: Secondary | ICD-10-CM

## 2019-11-07 DIAGNOSIS — I10 Essential (primary) hypertension: Secondary | ICD-10-CM | POA: Diagnosis not present

## 2019-11-07 DIAGNOSIS — Z923 Personal history of irradiation: Secondary | ICD-10-CM | POA: Insufficient documentation

## 2019-11-07 DIAGNOSIS — Z79899 Other long term (current) drug therapy: Secondary | ICD-10-CM | POA: Insufficient documentation

## 2019-11-07 DIAGNOSIS — Z148 Genetic carrier of other disease: Secondary | ICD-10-CM | POA: Insufficient documentation

## 2019-11-07 DIAGNOSIS — Z853 Personal history of malignant neoplasm of breast: Secondary | ICD-10-CM

## 2019-11-07 DIAGNOSIS — Z1501 Genetic susceptibility to malignant neoplasm of breast: Secondary | ICD-10-CM

## 2019-11-07 DIAGNOSIS — R6882 Decreased libido: Secondary | ICD-10-CM | POA: Insufficient documentation

## 2019-11-07 DIAGNOSIS — K429 Umbilical hernia without obstruction or gangrene: Secondary | ICD-10-CM | POA: Insufficient documentation

## 2019-11-07 DIAGNOSIS — Z1509 Genetic susceptibility to other malignant neoplasm: Secondary | ICD-10-CM

## 2019-11-07 MED ORDER — SENNOSIDES-DOCUSATE SODIUM 8.6-50 MG PO TABS: 2.0000 | ORAL_TABLET | Freq: Every day | ORAL | 0 refills | Status: DC

## 2019-11-07 MED ORDER — TRAMADOL HCL 50 MG PO TABS: 50.0000 mg | ORAL_TABLET | Freq: Four times a day (QID) | ORAL | 0 refills | Status: DC | PRN

## 2019-11-07 MED ORDER — IBUPROFEN 800 MG PO TABS: 800.0000 mg | ORAL_TABLET | Freq: Three times a day (TID) | ORAL | 0 refills | Status: DC | PRN

## 2019-11-07 MED FILL — traMADol HCL 50 MG TABS: 50 | 2 days supply | Qty: 10 | Fill #0

## 2019-11-07 MED FILL — IBUPROFEN 800 MG TAB: 800 | 10 days supply | Qty: 30 | Fill #0

## 2019-11-07 NOTE — Progress Notes (Signed)
Consult Note: Gyn-Onc  Consult was requested by Dr. Jana Khan for the evaluation of [name] 49 y.o. female  CC:  Chief Complaint  Patient presents with  . BRCA 2    Assessment/Plan:  [name]  is a 49 y.o.  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   I am recommending risk reducing BSO.  I discussed with the patient that hysterectomy is reasonable at the time of BSO.  I explained that this at some increased surgical risk and recovery, as well as additional risk of vaginal and pelvic infection and risk of cuff dehiscence.   She has an umbilical hernia and will require a umbilical hernia repair without mesh (with primary suture repair) at the time of surgery.  I explained that this has a risk for recurrence and may need reoperation in the future.  I explained that alternatives to surgery would be expectant management with serial ultrasound and Ca1 25 assessments.  I explained that these have not been shown to significantly stage shift ovarian cancer detection.   I explained that because of her triple negative breast cancer, she is a candidate for hormone replacement therapy with single agent estrogen postoperatively.  I explained that the latest data supports that single agent estrogen is not associated with an increased risk for breast cancer development, including among women with deleterious mutations in BRCA2.  I explained the surgical procedure, its anticipated recovery.  I expect that she will need 4 weeks for off of heavy lifting.  She is self-employed.   HPI: Ms [name] is a 49 year old P4 who was seen in consultation at the request of Dr [name] for evaluation of a deleterious mutation in [name].  The patient was diagnosed with stage III triple negative breast cancer and 2019.  She was treated with surgery, chemotherapy, and radiation.  She did not have mastectomy.  She was  originally tested for deleterious gene mutations that she was premenopausal and age, however at that time a mutation in BRCA2 that was of undetermined significance was identified.  Subsequently this gene mutation was identified as being pathogenic.  Her medical history is most significant for obesity and premature menopause while on chemotherapy.  She has low libido and this is bothersome to her.  Her surgical history is most significant for breast surgery and tubal ligation.  Her gynecologic history is remarkable for 4 prior vaginal deliveries.  She has a very remote history of an abnormal Pap smear more than 20 years ago but recent Paps have all been normal.  Her family cancer history is remarkable for a father who has colon cancer and a paternal grandmother had throat cancer in a cousin on her father's side who had breast cancer.  On her mother side of the family her mother was tested for BRCA 2 was negative.  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 She lives with her husband and 4 children.   Current Meds:  Outpatient Encounter Medications as of 11/07/2019  Medication Sig  . gabapentin (NEURONTIN) 100 MG capsule TAKE 2 CAPSULES BY MOUTH ONCE A DAY  . gabapentin (NEURONTIN) 300 MG capsule TAKE 2 CAPSULES (600 MG TOTAL) BY MOUTH AT BEDTIME. (Patient taking differently: Take 300-600 mg by mouth at bedtime. )  . ibuprofen (ADVIL) 800 MG tablet Take 1 tablet (800 mg total) by mouth every 8 (eight) hours as needed for moderate  pain. For AFTER surgery only  . senna-docusate (SENOKOT-S) 8.6-50 MG tablet Take 2 tablets by mouth at bedtime. For AFTER surgery, do not take if having diarrhea  . traMADol (ULTRAM) 50 MG tablet Take 1 tablet (50 mg total) by mouth every 6 (six) hours as needed for severe pain. For AFTER surgery, do not take and drive  . [DISCONTINUED] HYDROcodone-acetaminophen (NORCO) 5-325 MG tablet 1/2 to 1 tablet Q 6 hours prn pain  . [DISCONTINUED] ibuprofen  (ADVIL,MOTRIN) 200 MG tablet Take 1-2 tablets (200-400 mg total) by mouth every 8 (eight) hours as needed.  . [DISCONTINUED] ondansetron (ZOFRAN) 4 MG tablet Take 1 tablet (4 mg total) by mouth every 8 (eight) hours as needed for nausea or vomiting.  . [DISCONTINUED] oxyCODONE-acetaminophen (PERCOCET/ROXICET) 5-325 MG tablet Take 1-2 tablets by mouth every 6 (six) hours as needed for severe pain.  . [DISCONTINUED] tamsulosin (FLOMAX) 0.4 MG CAPS capsule Take 1 capsule (0.4 mg total) by mouth daily.   No facility-administered encounter medications on file as of 11/07/2019.    Allergy:  Allergies  Allergen Reactions  . Carboplatin Other (See Comments)    Red, blotchy, hot    Social Hx:   Social History   Socioeconomic History  . Marital status: Married    Spouse name: Not on file  . Number of children: Not on file  . Years of education: Not on file  . Highest education level: Not on file  Occupational History  . Not on file  Tobacco Use  . Smoking status: Never Smoker  . Smokeless tobacco: Never Used  Vaping Use  . Vaping Use: Never used  Substance and Sexual Activity  . Alcohol use: No  . Drug use: No  . Sexual activity: Not on file  Other Topics Concern  . Not on file  Social History Narrative  . Not on file   Social Determinants of Health   Financial Resource Strain:   . Difficulty of Paying Living Expenses: Not on file  Food Insecurity:   . Worried About Charity fundraiser in the Last Year: Not on file  . Ran Out of Food in the Last Year: Not on file  Transportation Needs:   . Lack of Transportation (Medical): Not on file  . Lack of Transportation (Non-Medical): Not on file  Physical Activity:   . Days of Exercise per Week: Not on file  . Minutes of Exercise per Session: Not on file  Stress:   . Feeling of Stress : Not on file  Social Connections:   . Frequency of Communication with Friends and Family: Not on file  . Frequency of Social Gatherings with  Friends and Family: Not on file  . Attends Religious Services: Not on file  . Active Member of Clubs or Organizations: Not on file  . Attends Archivist Meetings: Not on file  . Marital Status: Not on file  Intimate Partner Violence:   . Fear of Current or Ex-Partner: Not on file  . Emotionally Abused: Not on file  . Physically Abused: Not on file  . Sexually Abused: Not on file    Past Surgical Hx:  Past Surgical History:  Procedure Laterality Date  . ANKLE SURGERY    . BREAST LUMPECTOMY Left 01/27/2017  . BREAST LUMPECTOMY WITH RADIOACTIVE SEED AND SENTINEL LYMPH NODE BIOPSY Left 01/27/2017   Procedure: LEFT BREAST LUMPECTOMY WITH RADIOACTIVE SEED LOCALIZATION, LEFT AXILLARY DEEP LYMPH NODE BIOPSY WITH RADIOACTIVE SEED LOCALIZATION, LEFT AXILLARY SENTINEL LYMPH NODE MAPPING  AND BIOPSY WITH BLUE DYE INJECTION;  Surgeon: [name], MD;  Location: [facility];  Service: General;  Laterality: Left;  . IR FLUORO GUIDE PORT INSERTION RIGHT  07/14/2016  . IR US GUIDE VASC ACCESS RIGHT  07/14/2016  . PORT-A-CATH REMOVAL Right 01/27/2017   Procedure: REMOVAL PORT-A-CATH;  Surgeon: [name], MD;  Location: [facility];  Service: General;  Laterality: Right;  . TUBAL LIGATION    . WISDOM TOOTH EXTRACTION      Past Medical Hx:  Past Medical History:  Diagnosis Date  . Breast cancer ([place])    Left Breast Cancer  . Complication of anesthesia    DAY SURGERY 2010 OR 2011 ASPIRIATED AND STAYED OVERNIGHT  . History of radiation therapy 03/11/17- 04/21/17   Left breast, 2 Gy in 25 fractions for a total dose of 50 Gy, Boost, 2 Gy in 5 fractions for a total dose of 10 Gy  . Hypertension    OFF MEDS  SINCE CHEMO X 5 MONTHS  . Malignant neoplasm of upper-outer quadrant of left breast in female, estrogen receptor negative ([place]) 06/23/2016  . Personal history of chemotherapy 2019   Left Breast Cancer  . Personal history of radiation therapy 2019   Left Breast Cancer    Past Gynecological  History:   No LMP recorded. (Menstrual status: Other).  Family Hx:  Family History  Problem Relation Age of Onset  . Diabetes Mother   . Hypertension Mother   . Arthritis Mother   . Colon cancer Father 40       d.76 metastatic at time of diagnosis  . Diabetes Father   . Heart disease Father   . Hypertension Father   . Breast cancer Maternal Grandmother 90       d.60s  . Breast cancer Maternal Aunt 52  . Cervical cancer Maternal Aunt 42  . Breast cancer Maternal Aunt 46       d.50s  . Cancer Maternal Uncle        d.62s unspecified type of cancer  . Breast cancer Cousin 60       paternal first-cousin (daughter of unaffected aunt)  . Cancer Maternal Aunt 81       "Female Cancer"  . Cancer Maternal Aunt        unknown cancer  . Brain cancer Daughter 67    Review of Systems:  Constitutional  Feels well,  ENT Normal appearing ears and nares bilaterally Skin/Breast  No rash, sores, jaundice, itching, dryness Cardiovascular  No chest pain, shortness of breath, or edema  Pulmonary  No cough or wheeze.  Gastro Intestinal  No nausea, vomitting, or diarrhoea. No bright red blood per rectum, no abdominal pain, change in bowel movement, or constipation.  Genito Urinary  No frequency, urgency, dysuria, no bleeding Musculo Skeletal  No myalgia, arthralgia, joint swelling or pain  Neurologic  No weakness, numbness, change in gait,  Psychology  No depression, anxiety, insomnia.   Vitals:  Blood pressure 126/76, pulse 79, temperature (!) 97.2 F (36.2 C), temperature source Tympanic, resp. rate 18, height 5' 7.5" (1.715 m), weight 264 lb (119.7 kg), SpO2 99 %.  Physical Exam: WD in NAD Neck  Supple NROM, without any enlargements.  Lymph Node Survey No cervical supraclavicular or inguinal adenopathy Cardiovascular  Pulse normal rate, regularity and rhythm. S1 and S2 normal.  Lungs  Clear to auscultation bilateraly, without wheezes/crackles/rhonchi. Good air movement.   Skin  No rash/lesions/breakdown  Psychiatry  Alert and oriented to person,  place, and time  Abdomen  Normoactive bowel sounds, abdomen soft, non-tender and obese with umbilical hernia. Back No CVA tenderness Genito Urinary  Vulva/vagina: Normal external female genitalia.  No lesions. No discharge or bleeding.  Bladder/urethra:  No lesions or masses, well supported bladder  Vagina: normal  Cervix: Normal appearing, no lesions.  Uterus:  Small, mobile, no parametrial involvement or nodularity.  Adnexa: no palpabe masses. Rectal  deferred Extremities  No bilateral cyanosis, clubbing or edema.  60 minutes of total time was spent for this patient encounter, including preparation, face-to-face counseling with the patient and coordination of care, review of imaging (results and images), communication with the referring provider and documentation of the encounter.   Thereasa Solo, MD  11/07/2019, 12:53 PM

## 2019-11-07 NOTE — Patient Instructions (Signed)
Plan to have an ultrasound and CA 125 and we will contact you with the results.  Preparing for your Surgery  Plan for surgery on December 12, 2019 with Dr. Everitt Amber at [facility] will be scheduled for a robotic assisted total laparoscopic hysterectomy (removal of uterus and cervix), bilateral salpingo-oophorectomy, umbilical hernia repair.   Pre-operative Testing -You will receive a phone call from presurgical testing at [facility] to arrange for a pre-operative appointment, lab appointment, and COVID test. The COVID test normally happens 3 days prior to the surgery and they ask that you self quarantine after the test up until surgery to decrease chance of exposure.  -Bring your insurance card, copy of an advanced directive if applicable, medication list  -At that visit, you will be asked to sign a consent for a possible blood transfusion in case a transfusion becomes necessary during surgery.  The need for a blood transfusion is rare but having consent is a necessary part of your care.     -You should not be taking blood thinners or aspirin at least ten days prior to surgery unless instructed by your surgeon.  -Do not take supplements such as fish oil (omega 3), red yeast rice, turmeric before your surgery.   Day Before Surgery at [facility] will be asked to take in a light diet the day before surgery. You will be advised you can have clear liquids up until 3 hours before your surgery.    Eat a light diet the day before surgery.  Examples including soups, broths, toast, yogurt, mashed potatoes.  AVOID GAS PRODUCING FOODS. Things to avoid include carbonated beverages (fizzy beverages), raw fruits and raw vegetables, or beans.   If your bowels are filled with gas, your surgeon will have difficulty visualizing your pelvic organs which increases your surgical risks.  Your role in recovery Your role is to become active as soon as directed by your doctor, while still  giving yourself time to heal.  Rest when you feel tired. You will be asked to do the following in order to speed your recovery:  - Cough and breathe deeply. This helps to clear and expand your lungs and can prevent pneumonia after surgery.  - [place]. Do mild physical activity. Walking or moving your legs help your circulation and body functions return to normal. Do not try to get up or walk alone the first time after surgery.   -If you develop swelling on one leg or the other, pain in the back of your leg, redness/warmth in one of your legs, please call the office or go to the Emergency Room to have a doppler to rule out a blood clot. For shortness of breath, chest pain-seek care in the Emergency Room as soon as possible. - Actively manage your pain. Managing your pain lets you move in comfort. We will ask you to rate your pain on a scale of zero to 10. It is your responsibility to tell your doctor or nurse where and how much you hurt so your pain can be treated.  Special Considerations -If you are diabetic, you may be placed on insulin after surgery to have closer control over your blood sugars to promote healing and recovery.  This does not mean that you will be discharged on insulin.  If applicable, your oral antidiabetics will be resumed when you are tolerating a solid diet.  -Your final pathology results from surgery should be available around one  week after surgery and the results will be relayed to you when available.  -Dr. Lahoma Crocker is the surgeon that assists your GYN Oncologist with surgery.  If you end up staying the night, the next day after your surgery you will either see Dr. Denman George, Dr. Berline Lopes, or Dr. Lahoma Crocker.  -FMLA forms can be faxed to [phone] and please allow 5-7 business days for completion.  Pain Management After Surgery -You have been prescribed your pain medication and bowel regimen medications before surgery so that you can have  these available when you are discharged from the hospital. The pain medication is for use ONLY AFTER surgery and a new prescription will not be given.   -Make sure that you have Tylenol and Ibuprofen at home to use on a regular basis after surgery for pain control. We recommend alternating the medications every hour to six hours since they work differently and are processed in the body differently for pain relief.  -Review the attached handout on narcotic use and their risks and side effects.   Bowel Regimen -You have been prescribed Sennakot-S to take nightly to prevent constipation especially if you are taking the narcotic pain medication intermittently.  It is important to prevent constipation and drink adequate amounts of liquids. You can stop taking this medication when you are not taking pain medication and you are back on your normal bowel routine.  Risks of Surgery Risks of surgery are low but include bleeding, infection, damage to surrounding structures, re-operation, blood clots, and very rarely death.   Blood Transfusion Information (For the consent to be signed before surgery)  We will be checking your blood type before surgery so in case of emergencies, we will know what type of blood you would need.                                            WHAT IS A BLOOD TRANSFUSION?  A transfusion is the replacement of blood or some of its parts. Blood is made up of multiple cells which provide different functions.  Red blood cells carry oxygen and are used for blood loss replacement.  White blood cells fight against infection.  Platelets control bleeding.  Plasma helps clot blood.  Other blood products are available for specialized needs, such as hemophilia or other clotting disorders. BEFORE THE TRANSFUSION  Who gives blood for transfusions?   You may be able to donate blood to be used at a later date on yourself (autologous donation).  Relatives can be asked to donate blood.  This is generally not any safer than if you have received blood from a stranger. The same precautions are taken to ensure safety when a relative's blood is donated.  Healthy volunteers who are fully evaluated to make sure their blood is safe. This is blood bank blood. Transfusion therapy is the safest it has ever been in the practice of medicine. Before blood is taken from a donor, a complete history is taken to make sure that person has no history of diseases nor engages in risky social behavior (examples are intravenous drug use or sexual activity with multiple partners). The donor's travel history is screened to minimize risk of transmitting infections, such as malaria. The donated blood is tested for signs of infectious diseases, such as HIV and hepatitis. The blood is then tested to be sure it is compatible with  you in order to minimize the chance of a transfusion reaction. If you or a relative donates blood, this is often done in anticipation of surgery and is not appropriate for emergency situations. It takes many days to process the donated blood. RISKS AND COMPLICATIONS Although transfusion therapy is very safe and saves many lives, the main dangers of transfusion include:   Getting an infectious disease.  Developing a transfusion reaction. This is an allergic reaction to something in the blood you were given. Every precaution is taken to prevent this. The decision to have a blood transfusion has been considered carefully by your caregiver before blood is given. Blood is not given unless the benefits outweigh the risks.  AFTER SURGERY INSTRUCTIONS  Return to work: 4 weeks if applicable  Activity: 1. Be up and out of the bed during the day.  Take a nap if needed.  You may walk up steps but be careful and use the hand rail.  Stair climbing will tire you more than you think, you may need to stop part way and rest.   2. No lifting or straining for 6 weeks over 10 pounds. No pushing, pulling,  straining for 6 weeks.  3. No driving for 1 week(s).  Do not drive if you are taking narcotic pain medicine and make sure that your reaction time has returned.   4. You can shower as soon as the next day after surgery. Shower daily.  Use your regular soap and water (not directly on the incision) and pat your incision(s) dry afterwards; don't rub.  No tub baths or submerging your body in water until cleared by your surgeon. If you have the soap that was given to you by pre-surgical testing that was used before surgery, you do not need to use it afterwards because this can irritate your incisions.   5. No sexual activity and nothing in the vagina for 8 weeks.  6. You may experience a small amount of clear drainage from your incisions, which is normal.  If the drainage persists, increases, or changes color please call the office.  7. Do not use creams, lotions, or ointments such as neosporin on your incisions after surgery until advised by your surgeon because they can cause removal of the dermabond glue on your incisions.    8. You may experience vaginal spotting after surgery or around the 6-8 week mark from surgery when the stitches at the top of the vagina begin to dissolve.  The spotting is normal but if you experience heavy bleeding, call our office.  9. Take Tylenol or ibuprofen first for pain and only use narcotic pain medication for severe pain not relieved by the Tylenol or Ibuprofen.  Monitor your Tylenol intake to a max of 4,000 mg in a 24 hour period. You can alternate these medications after surgery.  Diet: 1. Low sodium Heart Healthy Diet is recommended but you are cleared to resume your normal (before surgery) diet after your procedure.  2. It is safe to use a laxative, such as Miralax or Colace, if you have difficulty moving your bowels. You have been prescribed Sennakot at bedtime every evening to keep bowel movements regular and to prevent constipation.    Wound Care: 1. Keep  clean and dry.  Shower daily.  Reasons to call the Doctor:  Fever - Oral temperature greater than 100.4 degrees Fahrenheit  Foul-smelling vaginal discharge  Difficulty urinating  Nausea and vomiting  Increased pain at the site of the incision that  is unrelieved with pain medicine.  Difficulty breathing with or without chest pain  New calf pain especially if only on one side  Sudden, continuing increased vaginal bleeding with or without clots.   Contacts: For questions or concerns you should contact:  Dr. Everitt Amber at [phone]  [name], NP at [phone]  After Hours: call [phone] and have the GYN Oncologist paged/contacted (after 5 pm or on the weekends)

## 2019-11-08 ENCOUNTER — Telehealth: Payer: Self-pay

## 2019-11-08 LAB — CA 125: Cancer Antigen (CA) 125: 6.3 U/mL (ref 0.0–38.1)

## 2019-11-08 NOTE — Telephone Encounter (Signed)
-----   Message from Dorothyann Gibbs, NP sent at 11/08/2019 10:53 AM EDT ----- Can you let her know her CA 125 is normal? Thanks ----- Message ----- From: Buel Ream, [facility] Sent: 11/08/2019   6:36 AM EDT To: Dorothyann Gibbs, NP

## 2019-11-08 NOTE — Telephone Encounter (Signed)
Told Ms Labus that the CA-125 was normal at 6.3 as noted below by Melissa Cross,NP.

## 2019-11-14 ENCOUNTER — Ambulatory Visit (HOSPITAL_COMMUNITY)
Admission: RE | Admit: 2019-11-14 | Discharge: 2019-11-14 | Disposition: A | Discharge status: Home / Self Care | Payer: Medicaid Other | Source: Ambulatory Visit | Attending: Gynecologic Oncology | Admitting: Gynecologic Oncology

## 2019-11-14 ENCOUNTER — Other Ambulatory Visit: Payer: Self-pay

## 2019-11-14 ENCOUNTER — Telehealth: Payer: Self-pay

## 2019-11-14 DIAGNOSIS — Z1501 Genetic susceptibility to malignant neoplasm of breast: Secondary | ICD-10-CM | POA: Diagnosis not present

## 2019-11-14 DIAGNOSIS — Z1509 Genetic susceptibility to other malignant neoplasm: Secondary | ICD-10-CM | POA: Diagnosis present

## 2019-11-14 DIAGNOSIS — Z1502 Genetic susceptibility to malignant neoplasm of ovary: Secondary | ICD-10-CM | POA: Insufficient documentation

## 2019-11-14 NOTE — Telephone Encounter (Signed)
Told [name] that the [place] from today showed no masses or abnormality seen per Melissa Cross,NP.  Pt verbalized understanding.

## 2019-11-16 ENCOUNTER — Telehealth: Payer: Self-pay | Admitting: *Deleted

## 2019-11-16 ENCOUNTER — Ambulatory Visit
Admission: RE | Admit: 2019-11-16 | Discharge: 2019-11-16 | Disposition: A | Discharge status: Home / Self Care | Payer: Medicaid Other | Source: Ambulatory Visit | Attending: Oncology | Admitting: Oncology

## 2019-11-16 ENCOUNTER — Other Ambulatory Visit: Payer: Self-pay

## 2019-11-16 DIAGNOSIS — Z1501 Genetic susceptibility to malignant neoplasm of breast: Secondary | ICD-10-CM

## 2019-11-16 DIAGNOSIS — C50412 Malignant neoplasm of upper-outer quadrant of left female breast: Secondary | ICD-10-CM

## 2019-11-16 DIAGNOSIS — Z1509 Genetic susceptibility to other malignant neoplasm: Secondary | ICD-10-CM

## 2019-11-16 MED ORDER — GADOBUTROL 1 MMOL/ML IV SOLN
10.0000 mL | Freq: Once | INTRAVENOUS | Status: AC | PRN
  Administered 2019-11-16: 10 mL via INTRAVENOUS

## 2019-11-16 MED FILL — IBUPROFEN 800 MG TAB: 800 | 10 days supply | Qty: 30 | Fill #0

## 2019-11-16 MED FILL — GABAPENTIN 300 MG CAPSULE: 300 | 45 days supply | Qty: 90 | Fill #2

## 2019-11-16 MED FILL — traMADol HCL 50 MG TABS: 50 | 2 days supply | Qty: 10 | Fill #0

## 2019-11-16 NOTE — Telephone Encounter (Signed)
Pt called to left Dr [name] know she had her MRI today and results are already back. She noted areas of concern. Dr [name] will call her to discuss

## 2019-11-17 ENCOUNTER — Encounter: Payer: Self-pay | Admitting: Oncology

## 2019-11-17 ENCOUNTER — Other Ambulatory Visit: Payer: Self-pay | Admitting: Oncology

## 2019-11-17 DIAGNOSIS — Z1502 Genetic susceptibility to malignant neoplasm of ovary: Secondary | ICD-10-CM

## 2019-11-17 DIAGNOSIS — C50412 Malignant neoplasm of upper-outer quadrant of left female breast: Secondary | ICD-10-CM

## 2019-11-17 DIAGNOSIS — Z1501 Genetic susceptibility to malignant neoplasm of breast: Secondary | ICD-10-CM

## 2019-11-17 DIAGNOSIS — Z1509 Genetic susceptibility to other malignant neoplasm: Secondary | ICD-10-CM

## 2019-11-17 DIAGNOSIS — Z171 Estrogen receptor negative status [ER-]: Secondary | ICD-10-CM

## 2019-11-17 NOTE — Progress Notes (Signed)
I called [name] and also spoke with her husband [name].  She understands that we do not know what these areas in both breasts are.  They could be benign.  They could be noninvasive breast cancer.  They could be invasive breast cancer.  We need to biopsy them and that step 1.  She has been referred to [facility] surgery since Dr. Dalbert Batman retired but she does not know the name of the surgeon yet.  She is also scheduled for a gynecologic surgery 12/12/2019.  I would not make any changes in that.  Tentatively I am scheduling her for a virtual visit with me in about 3 weeks just to make sure we get all this sorted out.

## 2019-11-20 ENCOUNTER — Telehealth: Payer: Self-pay | Admitting: Oncology

## 2019-11-20 ENCOUNTER — Other Ambulatory Visit: Payer: Self-pay | Admitting: Oncology

## 2019-11-20 NOTE — Telephone Encounter (Signed)
Scheduled appt per 11/12 sch msg - pt aware of appt date and time

## 2019-11-22 ENCOUNTER — Other Ambulatory Visit: Payer: Self-pay

## 2019-11-22 ENCOUNTER — Encounter: Payer: Self-pay | Admitting: Oncology

## 2019-11-22 MED ORDER — LORAZEPAM 0.5 MG PO TABS: ORAL_TABLET | ORAL | 0 refills | Status: DC

## 2019-11-22 MED FILL — LORazepam 0.5 MG TABS: 0.5 | 1 days supply | Qty: 2 | Fill #0

## 2019-11-24 ENCOUNTER — Ambulatory Visit
Admission: RE | Admit: 2019-11-24 | Discharge: 2019-11-24 | Disposition: A | Discharge status: Home / Self Care | Payer: Medicaid Other | Source: Ambulatory Visit | Attending: Oncology | Admitting: Oncology

## 2019-11-24 ENCOUNTER — Other Ambulatory Visit: Payer: Self-pay | Admitting: Diagnostic Radiology

## 2019-11-24 ENCOUNTER — Other Ambulatory Visit: Payer: Self-pay

## 2019-11-24 DIAGNOSIS — C50412 Malignant neoplasm of upper-outer quadrant of left female breast: Secondary | ICD-10-CM

## 2019-11-24 DIAGNOSIS — C50919 Malignant neoplasm of unspecified site of unspecified female breast: Secondary | ICD-10-CM

## 2019-11-24 DIAGNOSIS — Z1501 Genetic susceptibility to malignant neoplasm of breast: Secondary | ICD-10-CM

## 2019-11-24 DIAGNOSIS — Z1502 Genetic susceptibility to malignant neoplasm of ovary: Secondary | ICD-10-CM

## 2019-11-24 DIAGNOSIS — Z171 Estrogen receptor negative status [ER-]: Secondary | ICD-10-CM

## 2019-11-24 HISTORY — DX: Malignant neoplasm of unspecified site of unspecified female breast: C50.919

## 2019-11-24 MED ORDER — GADOBUTROL 1 MMOL/ML IV SOLN
10.0000 mL | Freq: Once | INTRAVENOUS | Status: AC | PRN
  Administered 2019-11-24: 10 mL via INTRAVENOUS

## 2019-11-27 ENCOUNTER — Other Ambulatory Visit: Payer: Self-pay | Admitting: Oncology

## 2019-11-28 ENCOUNTER — Other Ambulatory Visit: Payer: Self-pay | Admitting: Oncology

## 2019-11-28 ENCOUNTER — Encounter: Payer: Self-pay | Admitting: Oncology

## 2019-11-28 NOTE — Progress Notes (Signed)
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  Recall her prior tumor was triple negative.  She tells me she has an appointment with [name] 12/22/2019.  I'm going to see her the following week.  If the cancer turns out to be ER positive we can always initiate antiestrogens.  If it is again triple negative likely she will need chemotherapy and a port

## 2019-11-29 ENCOUNTER — Other Ambulatory Visit: Payer: Self-pay

## 2019-11-29 ENCOUNTER — Ambulatory Visit (AMBULATORY_SURGERY_CENTER): Payer: Self-pay | Admitting: *Deleted

## 2019-11-29 ENCOUNTER — Telehealth: Payer: Self-pay | Admitting: Gastroenterology

## 2019-11-29 ENCOUNTER — Other Ambulatory Visit: Payer: Self-pay | Admitting: Oncology

## 2019-11-29 ENCOUNTER — Telehealth: Payer: Self-pay | Admitting: *Deleted

## 2019-11-29 VITALS — Ht 67.5 in | Wt 255.0 lb

## 2019-11-29 DIAGNOSIS — Z800 Family history of malignant neoplasm of digestive organs: Secondary | ICD-10-CM

## 2019-11-29 DIAGNOSIS — Z1211 Encounter for screening for malignant neoplasm of colon: Secondary | ICD-10-CM

## 2019-11-29 MED ORDER — SUPREP BOWEL PREP KIT 17.5-3.13-1.6 GM/177ML PO SOLN: 1.0000 | Freq: Once | ORAL | 0 refills | Status: DC

## 2019-11-29 MED FILL — SUPREP BOWEL PREP KIT: 17.5-3.13-1 | 1 days supply | Qty: 354 | Fill #0

## 2019-11-29 NOTE — Telephone Encounter (Signed)
That is a bit too soon after abd surgery for my comfort.  Please lets change her to a Mid January or later colonoscopy for FH of CRC.  Thanks

## 2019-11-29 NOTE — Telephone Encounter (Signed)
Dr Ardis Hughs,  I called pt and informed her of the recommendation She asked me to check and see if you had any Colon prior to 12-7- you had any opening 12-1 Wednesday- she wanted to do this prior to hysterectomy and she thinks she may have to have a double mastectomy after the hysterectomy- I did her PV and wanted to make sure this is ok with you  Please advise, Lelan Pons

## 2019-11-29 NOTE — Telephone Encounter (Signed)
error 

## 2019-11-29 NOTE — Telephone Encounter (Signed)
Great solution, I completely agree.  Thanks

## 2019-11-29 NOTE — Telephone Encounter (Signed)
Dr Ardis Hughs,  This pt is scheduled with you for a colon screening 01-03-2020 She has a hx of Left breast cancer 2019 with Chemo and Radiation   This is path from 11-24-2019 Diagnosis 1. Breast, left, needle core biopsy, left breast - INVASIVE DUCTAL CARCINOMA. 2. Breast, left, needle core biopsy, left breast - ORGANIZING FAT NECROSIS. 3. Breast, right, needle core biopsy, right breast - COMPLEX SCLEROSING LESION.  She is scheduled 12-12-2019 for a  XI ROBOTIC ASSISTED TOTAL HYSTERECTOMY WITH BILATERAL SALPINGO OOPHORECTOMY Bilateral General  HERNIA REPAIR UMBILICAL ADULT N    I talked to her this morning and she states Dr [name] and Dr Denman George have told her it is ok to proceed with her Colon 12-29, 3 weeks post op Hysterectomy.  I explained to her about adbominal pressure at times with a colon, she has a [facility] in her father age 35.  She wants to proceed with her colon as scheduled  Please advise, Thanks, Lelan Pons PV

## 2019-11-29 NOTE — Progress Notes (Addendum)
No egg or soy allergy known to patient  No issues with past sedation with any surgeries or procedures no intubation problems in the past  No FH of Malignant Hyperthermia No diet pills per patient No home 02 use per patient  No blood thinners per patient  Pt denies issues with constipation  No A fib or A flutter  EMMI video to pt or via [place] 19 guidelines implemented in PV today with Pt and RN   Pt verified name, DOB, address and insurance during PV today. Pt sent  instruction packet  Via My Chart .PV completed over the phone. Pt encouraged to call with questions or issues - pt wanted to do colon prior to 12-7 hysterectomy- See [place] with Dr Ardis Hughs - pt will pick up Suprep today at [facility] test 11-29 at 11 am   Due to the COVID-19 pandemic we are asking patients to follow these guidelines. Please only bring one care partner. Please be aware that your care partner may wait in the car in the parking lot or if they feel like they will be too hot to wait in the car, they may wait in the lobby on the 4th floor. All care partners are required to wear a mask the entire time (we do not have any that we can provide them), they need to practice social distancing, and we will do a Covid check for all patient's and care partners when you arrive. Also we will check their temperature and your temperature. If the care partner waits in their car they need to stay in the parking lot the entire time and we will call them on their cell phone when the patient is ready for discharge so they can bring the car to the front of the building. Also all patient's will need to wear a mask into building.

## 2019-11-29 NOTE — Telephone Encounter (Signed)
Will proceed with 12-1 colon

## 2019-12-04 ENCOUNTER — Other Ambulatory Visit: Payer: Self-pay | Admitting: Gastroenterology

## 2019-12-04 ENCOUNTER — Telehealth (HOSPITAL_BASED_OUTPATIENT_CLINIC_OR_DEPARTMENT_OTHER): Payer: Medicaid Other | Admitting: Oncology

## 2019-12-04 ENCOUNTER — Other Ambulatory Visit: Payer: Self-pay | Admitting: Oncology

## 2019-12-04 DIAGNOSIS — Z1379 Encounter for other screening for genetic and chromosomal anomalies: Secondary | ICD-10-CM

## 2019-12-04 DIAGNOSIS — Z148 Genetic carrier of other disease: Secondary | ICD-10-CM | POA: Diagnosis not present

## 2019-12-04 DIAGNOSIS — Z853 Personal history of malignant neoplasm of breast: Secondary | ICD-10-CM

## 2019-12-04 DIAGNOSIS — C50412 Malignant neoplasm of upper-outer quadrant of left female breast: Secondary | ICD-10-CM

## 2019-12-04 DIAGNOSIS — Z1502 Genetic susceptibility to malignant neoplasm of ovary: Secondary | ICD-10-CM | POA: Diagnosis not present

## 2019-12-04 LAB — SARS CORONAVIRUS 2 (TAT 6-24 HRS): SARS Coronavirus 2: NEGATIVE

## 2019-12-04 NOTE — Patient Instructions (Addendum)
DUE TO COVID-19 ONLY ONE VISITOR IS ALLOWED TO COME WITH YOU AND STAY IN THE WAITING ROOM ONLY DURING PRE OP AND PROCEDURE DAY OF SURGERY. THE 1 VISITOR  MAY VISIT WITH YOU AFTER SURGERY IN YOUR PRIVATE ROOM DURING VISITING HOURS ONLY!  YOU NEED TO HAVE A COVID 19 TEST ON: 12/08/19 @ 2:15 PM, THIS TEST MUST BE DONE BEFORE SURGERY,  COVID TESTING SITE [place] [id number], IT IS ON THE RIGHT GOING OUT [address] APPROXIMATELY  2 MINUTES PAST [employer] ON THE RIGHT. ONCE YOUR COVID TEST IS COMPLETED,  PLEASE BEGIN THE QUARANTINE INSTRUCTIONS AS OUTLINED IN YOUR HANDOUT.                [place]    Your procedure is scheduled on: 12/12/19   Report to [facility] Main  Entrance   Report to short stay at: 5:30 AM     Call this number if you have problems the morning of surgery [phone]    Remember: Do not eat food or drink liquids :After Midnight.   BRUSH YOUR TEETH MORNING OF SURGERY AND RINSE YOUR MOUTH OUT, NO CHEWING GUM CANDY OR MINTS.                        You may not have any metal on your body including hair pins and              piercings  Do not wear jewelry, make-up, lotions, powders or perfumes, deodorant             Do not wear nail polish on your fingernails.  Do not shave  48 hours prior to surgery.         Do not bring valuables to the hospital. [place].  Contacts, dentures or bridgework may not be worn into surgery.  Leave suitcase in the car. After surgery it may be brought to your room.     Patients discharged the day of surgery will not be allowed to drive home. IF YOU ARE HAVING SURGERY AND GOING HOME THE SAME DAY, YOU MUST HAVE AN ADULT TO DRIVE YOU HOME AND BE WITH YOU FOR 24 HOURS. YOU MAY GO HOME BY TAXI OR UBER OR ORTHERWISE, BUT AN ADULT MUST ACCOMPANY YOU HOME AND STAY WITH YOU FOR 24 HOURS.  Name and phone number of your driver:  Special  Instructions: N/A              Please read over the following fact sheets you were given: _____________________________________________________________________          [facility] - Preparing for Surgery Before surgery, you can play an important role.  Because skin is not sterile, your skin needs to be as free of germs as possible.  You can reduce the number of germs on your skin by washing with CHG (chlorahexidine gluconate) soap before surgery.  CHG is an antiseptic cleaner which kills germs and bonds with the skin to continue killing germs even after washing. Please DO NOT use if you have an allergy to CHG or antibacterial soaps.  If your skin becomes reddened/irritated stop using the CHG and inform your nurse when you arrive at Short Stay. Do not shave (including legs and underarms) for at least 48 hours prior to the first CHG shower.  You may shave your face/neck. Please follow these instructions carefully:  1.  Shower with CHG Soap the night before surgery and the  morning of Surgery.  2.  If you choose to wash your hair, wash your hair first as usual with your  normal  shampoo.  3.  After you shampoo, rinse your hair and body thoroughly to remove the  shampoo.                           4.  Use CHG as you would any other liquid soap.  You can apply chg directly  to the skin and wash                       Gently with a scrungie or clean washcloth.  5.  Apply the CHG Soap to your body ONLY FROM THE NECK DOWN.   Do not use on face/ open                           Wound or open sores. Avoid contact with eyes, ears mouth and genitals (private parts).                       Wash face,  Genitals (private parts) with your normal soap.             6.  Wash thoroughly, paying special attention to the area where your surgery  will be performed.  7.  Thoroughly rinse your body with warm water from the neck down.  8.  DO NOT shower/wash with your normal soap after using and rinsing off  the CHG Soap.                 9.  Pat yourself dry with a clean towel.            10.  Wear clean pajamas.            11.  Place clean sheets on your bed the night of your first shower and do not  sleep with pets. Day of Surgery : Do not apply any lotions/deodorants the morning of surgery.  Please wear clean clothes to the hospital/surgery center.  FAILURE TO FOLLOW THESE INSTRUCTIONS MAY RESULT IN THE CANCELLATION OF YOUR SURGERY PATIENT SIGNATURE_________________________________  NURSE SIGNATURE__________________________________  ________________________________________________________________________

## 2019-12-04 NOTE — Progress Notes (Signed)
[name] had follow-up diagnostic mammography 10/02/2019 which was entirely unremarkable.  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  UPDATE:  BRCA2 [id number] VUS has been upgraded to [place]. The change in variant classification was made as a result of re-review of the evidence in light of new variant interpretation guidelines and/or new information.  Accordingly we initiated intensified screening with breast MRI 11/16/2019.  This found the breast density to be B.  Both axillae were benign.  However in the right breast there was an area of non-mass-like enhancement measuring 1.9 cm. This area was biopsied 11/24/2019 and showed a complex sclerosing lesion.  In the left breast there was a small nodule measuring 0.7 cm and an area of non-mass-like enhancement during 2.4 cm. 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  She is scheduled for colonoscopy 12/06/2019, hysterectomy and bilateral salpingooophorectomy 12/11/2019 and then she will be meeting with [name] the third week in December to discuss her definitive surgery.  [name] wants to proceed with bilateral mastectomies and that is I think the best choice for her.  We discussed the fact that there are different types of mastectomies and whether she is a candidate for nipple or skin sparing is a technical issue that she will discuss with [name].  Also what kind of reconstruction would be best for her is best discussed with him and with the plastic surgeon that he chooses.  All of this will take a little time but then she is going to be recovering from her hysterectomy so I think most likely she will have her surgery in January.  The 2 questions for [place] are whether we should start tamoxifen at  this point since her cancer is estrogen receptor positive although weakly so and whether she should have a port put in at the same time as the definitive surgery.  I do not think we want to put in a port at the time of the surgery because we may change her mind regarding chemotherapy depending on the size of the tumor.  We can always put the port and later on once we make a definitive decision  The other question is more complicated.  She is going to be undergoing quite a few procedures.  I think it would be fine for her to wait until January for her surgery I do not think she is increasing her risk of losing control of this tumor because of that delay.  If there is going to be more significant delay then we would start anastrozole  She is comfortable with this plan.  She has many questions and concerns of course.  She knows to call for any other issue that may develop before her next visit with me which will be in January.

## 2019-12-05 ENCOUNTER — Other Ambulatory Visit: Payer: Self-pay

## 2019-12-05 ENCOUNTER — Encounter (HOSPITAL_COMMUNITY)
Admission: RE | Admit: 2019-12-05 | Discharge: 2019-12-05 | Disposition: A | Discharge status: Home / Self Care | Payer: Medicaid Other | Source: Ambulatory Visit | Attending: Gynecologic Oncology | Admitting: Gynecologic Oncology

## 2019-12-05 ENCOUNTER — Encounter (HOSPITAL_COMMUNITY): Payer: Self-pay

## 2019-12-05 DIAGNOSIS — Z01812 Encounter for preprocedural laboratory examination: Secondary | ICD-10-CM | POA: Diagnosis present

## 2019-12-05 HISTORY — DX: Unspecified osteoarthritis, unspecified site: M19.90

## 2019-12-05 LAB — CBC
HCT: 40.1 % (ref 36.0–46.0)
Hemoglobin: 13.6 g/dL (ref 12.0–15.0)
MCH: 30.6 pg (ref 26.0–34.0)
MCHC: 33.9 g/dL (ref 30.0–36.0)
MCV: 90.3 fL (ref 80.0–100.0)
Platelets: 156 10*3/uL (ref 150–400)
RBC: 4.44 MIL/uL (ref 3.87–5.11)
RDW: 13 % (ref 11.5–15.5)
WBC: 3.9 10*3/uL — ABNORMAL LOW (ref 4.0–10.5)
nRBC: 0 % (ref 0.0–0.2)

## 2019-12-05 LAB — COMPREHENSIVE METABOLIC PANEL
ALT: 17 U/L (ref 0–44)
AST: 20 U/L (ref 15–41)
Albumin: 4 g/dL (ref 3.5–5.0)
Alkaline Phosphatase: 80 U/L (ref 38–126)
Anion gap: 6 (ref 5–15)
BUN: 11 mg/dL (ref 6–20)
CO2: 30 mmol/L (ref 22–32)
Calcium: 9.2 mg/dL (ref 8.9–10.3)
Chloride: 101 mmol/L (ref 98–111)
Creatinine, Ser: 0.73 mg/dL (ref 0.44–1.00)
GFR, Estimated: >60 mL/min (ref >60)
Glucose, Bld: 117 mg/dL — ABNORMAL HIGH (ref 70–99)
Potassium: 3.7 mmol/L (ref 3.5–5.1)
Sodium: 137 mmol/L (ref 135–145)
Total Bilirubin: 1 mg/dL (ref 0.3–1.2)
Total Protein: 7.3 g/dL (ref 6.5–8.1)

## 2019-12-05 LAB — URINALYSIS, ROUTINE W REFLEX MICROSCOPIC
Bilirubin Urine: NEGATIVE
Glucose, UA: NEGATIVE mg/dL
Ketones, ur: NEGATIVE mg/dL
Leukocytes,Ua: NEGATIVE
Nitrite: NEGATIVE
Protein, ur: NEGATIVE mg/dL
Specific Gravity, Urine: 1.024 (ref 1.005–1.030)
pH: 5 (ref 5.0–8.0)

## 2019-12-05 NOTE — Progress Notes (Signed)
COVID Vaccine Completed: NO Date COVID Vaccine completed: COVID vaccine manufacturer: [place]   PCP - Dr. [name] Cardiologist -   Chest x-ray -  EKG - 05/25/19 Stress Test -  ECHO - 06/01/17 Cardiac Cath -  Pacemaker/ICD device last checked:  Sleep Study -  CPAP -   Fasting Blood Sugar -  Checks Blood Sugar _____ times a day  Blood Thinner Instructions: Aspirin Instructions: Last Dose:  Anesthesia review: Hx: HTN  Patient denies shortness of breath, fever, cough and chest pain at PAT appointment   Patient verbalized understanding of instructions that were given to them at the PAT appointment. Patient was also instructed that they will need to review over the PAT instructions again at home before surgery.

## 2019-12-06 ENCOUNTER — Other Ambulatory Visit: Payer: Self-pay | Admitting: Gastroenterology

## 2019-12-06 ENCOUNTER — Encounter: Payer: Self-pay | Admitting: Gastroenterology

## 2019-12-06 ENCOUNTER — Ambulatory Visit (AMBULATORY_SURGERY_CENTER): Payer: Medicaid Other | Admitting: Gastroenterology

## 2019-12-06 VITALS — BP 142/78 | HR 70 | Temp 97.8°F | Resp 17 | Ht 67.5 in | Wt 255.0 lb

## 2019-12-06 DIAGNOSIS — Z1211 Encounter for screening for malignant neoplasm of colon: Secondary | ICD-10-CM | POA: Diagnosis not present

## 2019-12-06 DIAGNOSIS — D120 Benign neoplasm of cecum: Secondary | ICD-10-CM

## 2019-12-06 LAB — URINE CULTURE

## 2019-12-06 MED ORDER — SODIUM CHLORIDE 0.9 % IV SOLN: 500.0000 mL | INTRAVENOUS | Status: DC

## 2019-12-06 NOTE — Progress Notes (Signed)
Called to room to assist during endoscopic procedure.  Patient ID and intended procedure confirmed with present staff. Received instructions for my participation in the procedure from the performing physician.  

## 2019-12-06 NOTE — Op Note (Signed)
[facility] Patient Name: [name] Procedure Date: 12/06/2019 2:22 PM MRN: [id number] Endoscopist: Milus Banister , MD Age: 49 Referring MD:  Date of Birth: Dec 07, 1970 Gender: Female Account #: [id number] Procedure:                Colonoscopy Indications:              Screening for colorectal malignant neoplasm Medicines:                Monitored Anesthesia Care Procedure:                Pre-Anesthesia Assessment:                           - Prior to the procedure, a History and Physical                            was performed, and patient medications and                            allergies were reviewed. The patient's tolerance of                            previous anesthesia was also reviewed. The risks                            and benefits of the procedure and the sedation                            options and risks were discussed with the patient.                            All questions were answered, and informed consent                            was obtained. Prior Anticoagulants: The patient has                            taken no previous anticoagulant or antiplatelet                            agents. ASA Grade Assessment: III - A patient with                            severe systemic disease. After reviewing the risks                            and benefits, the patient was deemed in                            satisfactory condition to undergo the procedure.                           After obtaining informed consent, the colonoscope  was passed under direct vision. Throughout the                            procedure, the patient's blood pressure, pulse, and                            oxygen saturations were monitored continuously. The                            Colonoscope was introduced through the anus and                            advanced to the the cecum, identified by                            appendiceal orifice  and ileocecal valve. The                            colonoscopy was performed without difficulty. The                            patient tolerated the procedure well. The quality                            of the bowel preparation was good. The ileocecal                            valve, appendiceal orifice, and rectum were                            photographed. Scope In: 2:28:53 PM Scope Out: 2:49:10 PM Scope Withdrawal Time: 0 hours 17 minutes 1 second  Total Procedure Duration: 0 hours 20 minutes 17 seconds  Findings:                 2.5cm lesion in the very proximal ascending colon,                            nearly adjacent to the IC valve. The lesion is                            centrally firm and umbilicated with obvious                            adenomatous mucosa surrounding. This was biopsied                            with a cold forceps for histology.                           The exam was otherwise without abnormality on                            direct and retroflexion views. Complications:  No immediate complications. Estimated blood loss:                            None. Estimated Blood Loss:     Estimated blood loss: none. Impression:               - Advanced polyp vs. very small colon cancer in the                            proximal ascending colon, nearly adjancent to the                            IC valve. This was biopsied extensively.                           - The examination was otherwise normal on direct                            and retroflexion views. Recommendation:           - Patient has a contact number available for                            emergencies. The signs and symptoms of potential                            delayed complications were discussed with the                            patient. Return to normal activities tomorrow.                            Written discharge instructions were provided to the                             patient.                           - Resume previous diet.                           - Continue present medications.                           - Await pathology results. Sent rush. If this is                            proven to be malignant then will likely want to try                            to coordinate upcoming gynecologic surgery with a                            right hemicolectomy. I will alert Milus Banister, MD 12/06/2019 3:06:39 PM This report has been signed electronically.

## 2019-12-06 NOTE — Progress Notes (Signed)
Pt's states no medical or surgical changes since previsit or office visit. 

## 2019-12-06 NOTE — Progress Notes (Signed)
Report given to PACU, vss 

## 2019-12-06 NOTE — Patient Instructions (Signed)
See procedure results "recommendations" for further instructions.   YOU HAD AN ENDOSCOPIC PROCEDURE TODAY AT [facility]:   Refer to the procedure report that was given to you for any specific questions about what was found during the examination.  If the procedure report does not answer your questions, please call your gastroenterologist to clarify.  If you requested that your care partner not be given the details of your procedure findings, then the procedure report has been included in a sealed envelope for you to review at your convenience later.  YOU SHOULD EXPECT: Some feelings of bloating in the abdomen. Passage of more gas than usual.  Walking can help get rid of the air that was put into your GI tract during the procedure and reduce the bloating. If you had a lower endoscopy (such as a colonoscopy or flexible sigmoidoscopy) you may notice spotting of blood in your stool or on the toilet paper. If you underwent a bowel prep for your procedure, you may not have a normal bowel movement for a few days.  Please Note:  You might notice some irritation and congestion in your nose or some drainage.  This is from the oxygen used during your procedure.  There is no need for concern and it should clear up in a day or so.  SYMPTOMS TO REPORT IMMEDIATELY:   Following lower endoscopy (colonoscopy or flexible sigmoidoscopy):  Excessive amounts of blood in the stool  Significant tenderness or worsening of abdominal pains  Swelling of the abdomen that is new, acute  Fever of 100F or higher  For urgent or emergent issues, a gastroenterologist can be reached at any hour by calling [phone]. Do not use MyChart messaging for urgent concerns.    DIET:  We do recommend a small meal at first, but then you may proceed to your regular diet.  Drink plenty of fluids but you should avoid alcoholic beverages for 24 hours.  ACTIVITY:  You should plan to take it easy for the rest of today and  you should NOT DRIVE or use heavy machinery until tomorrow (because of the sedation medicines used during the test).    FOLLOW UP: Our staff will call the number listed on your records 48-72 hours following your procedure to check on you and address any questions or concerns that you may have regarding the information given to you following your procedure. If we do not reach you, we will leave a message.  We will attempt to reach you two times.  During this call, we will ask if you have developed any symptoms of COVID 19. If you develop any symptoms (ie: fever, flu-like symptoms, shortness of breath, cough etc.) before then, please call [phone].  If you test positive for Covid 19 in the 2 weeks post procedure, please call and report this information to [place].    If any biopsies were taken you will be contacted by phone or by letter within the next 1-3 weeks.  Please call us at [phone] if you have not heard about the biopsies in 3 weeks.    SIGNATURES/CONFIDENTIALITY: You and/or your care partner have signed paperwork which will be entered into your electronic medical record.  These signatures attest to the fact that that the information above on your After Visit Summary has been reviewed and is understood.  Full responsibility of the confidentiality of this discharge information lies with you and/or your care-partner.

## 2019-12-07 ENCOUNTER — Telehealth: Payer: Self-pay | Admitting: Gastroenterology

## 2019-12-07 NOTE — Telephone Encounter (Signed)
Patient called requesting if path results are back yet stating there were sent STAT due to her procedure she is going to have done on 12/12/19.

## 2019-12-07 NOTE — Telephone Encounter (Signed)
The pt has been advised that as soon as the path results are received we will contact her. The pt has been advised of the information and verbalized understanding.

## 2019-12-07 NOTE — Telephone Encounter (Signed)
This pt is not a pt of Dr. Tarri Glenn. Routing this message to the appropriate care team.

## 2019-12-08 ENCOUNTER — Telehealth: Payer: Self-pay | Admitting: Gynecologic Oncology

## 2019-12-08 ENCOUNTER — Other Ambulatory Visit (HOSPITAL_COMMUNITY): Payer: Medicaid Other

## 2019-12-08 ENCOUNTER — Telehealth: Payer: Self-pay

## 2019-12-08 ENCOUNTER — Telehealth: Payer: Self-pay | Admitting: *Deleted

## 2019-12-08 ENCOUNTER — Telehealth: Payer: Medicaid Other | Admitting: Oncology

## 2019-12-08 ENCOUNTER — Other Ambulatory Visit: Payer: Self-pay

## 2019-12-08 DIAGNOSIS — Z800 Family history of malignant neoplasm of digestive organs: Secondary | ICD-10-CM

## 2019-12-08 DIAGNOSIS — Z1211 Encounter for screening for malignant neoplasm of colon: Secondary | ICD-10-CM

## 2019-12-08 DIAGNOSIS — K635 Polyp of colon: Secondary | ICD-10-CM

## 2019-12-08 NOTE — Telephone Encounter (Signed)
°  Follow up Call-  Call back number 12/06/2019  Post procedure Call Back phone  # [phone]  Permission to leave phone message Yes  Some recent data might be hidden     Patient questions:  Do you have a fever, pain , or abdominal swelling? No. Pain Score  0 *  Have you tolerated food without any problems? Yes.    Have you been able to return to your normal activities? Yes.    Do you have any questions about your discharge instructions: Diet   No. Medications  No. Follow up visit  No.  Do you have questions or concerns about your Care? No.  Actions: * If pain score is 4 or above: No action needed, pain <4.  1. Have you developed a fever since your procedure? no  2.   Have you had an respiratory symptoms (SOB or cough) since your procedure? no  3.   Have you tested positive for COVID 19 since your procedure no  4.   Have you had any family members/close contacts diagnosed with the COVID 19 since your procedure?  no   If yes to any of these questions please route to [name], RN and Joella Prince, RN

## 2019-12-08 NOTE — Progress Notes (Signed)
Urine culture results: multiple species present,sugest recollection.

## 2019-12-08 NOTE — Telephone Encounter (Signed)
°  Follow up Call-  Call back number 12/06/2019  Post procedure Call Back phone  # [phone]  Permission to leave phone message Yes  Some recent data might be hidden     Patient questions:  Message left to call us if necessary

## 2019-12-08 NOTE — Telephone Encounter (Signed)
Called to talk with patient about upcoming surgery with Dr. Denman George. Advised her that after discussion between [name], Dr. Denman George, and Dr. Ardis Hughs, it was decided that her prophylactic hysterectomy/BSO scheduled for Dec 12, 2019 would need to be placed on hold at this time for her to complete her breast cancer surgery/treatment. Discussed the path from her recent colonoscopy. She states she received a message from Dr. Ardis Hughs.   She states she will reach out to Dr. Cristal Generous office to see if she can be seen sooner. Advised to call for any needs or concerns.

## 2019-12-11 ENCOUNTER — Ambulatory Visit (HOSPITAL_COMMUNITY)
Admission: RE | Admit: 2019-12-11 | Discharge: 2019-12-11 | Disposition: A | Discharge status: Home / Self Care | Payer: Medicaid Other | Source: Ambulatory Visit | Attending: Oncology | Admitting: Oncology

## 2019-12-11 ENCOUNTER — Encounter (HOSPITAL_COMMUNITY)
Admission: RE | Admit: 2019-12-11 | Discharge: 2019-12-11 | Disposition: A | Discharge status: Home / Self Care | Payer: Medicaid Other | Source: Ambulatory Visit | Attending: Oncology | Admitting: Oncology

## 2019-12-11 ENCOUNTER — Other Ambulatory Visit: Payer: Self-pay

## 2019-12-11 DIAGNOSIS — Z1379 Encounter for other screening for genetic and chromosomal anomalies: Secondary | ICD-10-CM

## 2019-12-11 DIAGNOSIS — Z171 Estrogen receptor negative status [ER-]: Secondary | ICD-10-CM | POA: Diagnosis present

## 2019-12-11 DIAGNOSIS — C50412 Malignant neoplasm of upper-outer quadrant of left female breast: Secondary | ICD-10-CM | POA: Insufficient documentation

## 2019-12-11 MED ORDER — TECHNETIUM TC 99M MEDRONATE IV KIT
20.0000 | PACK | Freq: Once | INTRAVENOUS | Status: AC | PRN
  Administered 2019-12-11: 20 via INTRAVENOUS

## 2019-12-12 ENCOUNTER — Ambulatory Visit (HOSPITAL_COMMUNITY): Admission: RE | Admit: 2019-12-12 | Payer: Medicaid Other | Source: Home / Self Care | Admitting: Gynecologic Oncology

## 2019-12-12 DIAGNOSIS — Z1502 Genetic susceptibility to malignant neoplasm of ovary: Secondary | ICD-10-CM

## 2019-12-12 DIAGNOSIS — Z1501 Genetic susceptibility to malignant neoplasm of breast: Secondary | ICD-10-CM

## 2019-12-12 DIAGNOSIS — K429 Umbilical hernia without obstruction or gangrene: Secondary | ICD-10-CM

## 2019-12-12 LAB — TYPE AND SCREEN
ABO/RH(D): B POS
Antibody Screen: NEGATIVE

## 2019-12-12 SURGERY — HYSTERECTOMY, TOTAL, ROBOT-ASSISTED, LAPAROSCOPIC, WITH BILATERAL SALPINGO-OOPHORECTOMY
Anesthesia: General

## 2019-12-14 NOTE — Progress Notes (Signed)
[place]  [phone] [phone]     ID: [name] DOB: 1970-11-25  MR#: [id number]  ATF#:[id number]  Patient Care Team: [name], MD as PCP - General (Internal Medicine) Zaiya Annunziato, [name], MD as Consulting Physician (Oncology) [name], MD as Attending Physician (Radiation Oncology) Gardenia Phlegm, NP as Nurse Practitioner (Hematology and Oncology) Rolm Bookbinder, MD as Consulting Physician (General Surgery) OTHER MD:  I connected with [name] on 12/14/19 at 11:30 AM EST by telephone visit and verified that I am speaking with the correct person using two identifiers.   I discussed the limitations, risks, security and privacy concerns of performing an evaluation and management service by telemedicine and the availability of in-person appointments. I also discussed with the patient that there may be a patient responsible charge related to this service. The patient expressed understanding and agreed to proceed.   Other persons participating in the visit and their role in the encounter: Patient's husband  Patient's location: Family car Provider's location: [place]: Triple negative breast cancer; BRCA2 positive  CURRENT TREATMENT: intensified screening   INTERVAL HISTORY: [name] was contacted today for a follow-up of her triple negative breast cancer.  Her husband [name] was present and participated in the discussion.  After her VUS in BRCA was reclassified as deleterious we initiated intensified screening with breast MRI 11/16/2019.  This found the breast density to be B.  Both axillae were benign.  However in the right breast there was an area of non-mass-like enhancement measuring 1.9 cm. This area was biopsied 11/24/2019 and showed a complex sclerosing lesion.  In the left breast there was a small nodule measuring 0.7 cm and an area of non-mass-like  enhancement during 2.4 cm. 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  She underwent colonoscopy on 12/06/2019 under Dr. Ardis Hughs. A "somewhat unusual-appearing cecal polyp" was removed, and pathology ([id number]) showed tubular adenoma fragments.  I do not have a note from Dr. Ardis Hughs but from the patient's report today he recommended surgical resection of this suspicious area.   She also underwent bone scan on 12/11/2019 showing no evidence of osseous metastatic disease.  Her hysterectomy and bilateral salpingooophorectomy has been delayed.  She has met with [name] and plans on bilateral mastectomies.  She tells me she has an appointment with plastics to discuss reconstruction options.  She expects to have her partial colectomy done through a combined procedure with bilateral salpingo-oophorectomy once she recovers from her mastectomies    REVIEW OF SYSTEMS: [name] is very anxious but physically is fine.  She continues to work with her husband.  She is not exercising regularly at this point.   COVID 19 VACCINATION STATUS:    BREAST CANCER HISTORY: From the original intake note:  The patient was seen in the emergency room 05/16/2016 with nonspecific complaints but also noting that she had a tender lump in her lateral left breast which she said she had noted the day before. Exam by the emergency room physician confirmed a 3 cm soft left lateral breast mass without overlying erythema or nipple changes. This was felt to be most consistent with fibrocystic change but the patient was referred back to her primary care physician for further evaluation. She saw  the physician assistant in May 18 and then Dr. Wynetta Emery on 06/12/2016 who scheduled a bilateral diagnostic  mammography with tomography and left breast ultrasonography at the [facility] 06/18/2016. This found the breast density to be category B. In the left breast upper outer quadrant there was an area of asymmetry measuring up to 10.3 cm. On exam this was firm and palpable and on ultrasound there was an irregular mass measuring 2.5 cm with some subtle changes in the surrounding tissue. In the left lower axilla there were 2 suspicious-looking lymph nodes.  Biopsy of the left breast upper outer quadrant and one of the suspicious lymph nodes 06/19/2016 showed ([id number]) both to be involved by invasive ductal carcinoma, grade 3, estrogen and progesterone receptor negative, HER-2 not amplified with a signals ratio being 1.53-1.74 and the number per cell 2.35-3.05. The MIB-1 was 90%.  The patient's subsequent history is as detailed below.   PAST MEDICAL HISTORY: Past Medical History:  Diagnosis Date  . Arthritis   . Breast cancer ([place])    Left Breast Cancer  . Breast cancer ([place]) 11/24/2019   left   . Complication of anesthesia    DAY SURGERY 2010 OR 2011 ASPIRIATED AND STAYED OVERNIGHT  . History of radiation therapy 03/11/17- 04/21/17   Left breast, 2 Gy in 25 fractions for a total dose of 50 Gy, Boost, 2 Gy in 5 fractions for a total dose of 10 Gy  . Hypertension    OFF MEDS  SINCE CHEMO X 5 MONTHS  . Malignant neoplasm of upper-outer quadrant of left breast in female, estrogen receptor negative ([place]) 06/23/2016  . Personal history of chemotherapy 2019   Left Breast Cancer  07-2016-12-2016  . Personal history of radiation therapy 2019   Left Breast Cancer    PAST SURGICAL HISTORY: Past Surgical History:  Procedure Laterality Date  . ANKLE SURGERY    . BREAST LUMPECTOMY Left 01/27/2017  . BREAST LUMPECTOMY WITH RADIOACTIVE SEED AND SENTINEL LYMPH NODE BIOPSY Left 01/27/2017   Procedure: LEFT BREAST LUMPECTOMY WITH RADIOACTIVE SEED LOCALIZATION, LEFT AXILLARY DEEP LYMPH NODE BIOPSY WITH  RADIOACTIVE SEED LOCALIZATION, LEFT AXILLARY SENTINEL LYMPH NODE MAPPING AND BIOPSY WITH BLUE DYE INJECTION;  Surgeon: [name], MD;  Location: [facility];  Service: General;  Laterality: Left;  . IR FLUORO GUIDE PORT INSERTION RIGHT  07/14/2016  . IR US GUIDE VASC ACCESS RIGHT  07/14/2016  . PORT-A-CATH REMOVAL Right 01/27/2017   Procedure: REMOVAL PORT-A-CATH;  Surgeon: [name], MD;  Location: [facility];  Service: General;  Laterality: Right;  . TUBAL LIGATION    . WISDOM TOOTH EXTRACTION      FAMILY HISTORY Family History  Problem Relation Age of Onset  . Diabetes Mother   . Hypertension Mother   . Arthritis Mother   . Colon cancer Father 81       died .76 metastatic at time of diagnosis  . Diabetes Father   . Heart disease Father   . Hypertension Father   . Breast cancer Maternal Grandmother 21       d.60s  . Breast cancer Maternal Aunt 52  . Cervical cancer Maternal Aunt 41  . Breast cancer Maternal Aunt 46       d.50s  . Cancer Maternal Uncle        d.62s unspecified type of cancer  . Breast cancer Cousin 32       paternal first-cousin (daughter of unaffected aunt)  . Cancer Maternal Aunt 45       "  Female Cancer"  . Cancer Maternal Aunt        unknown cancer  . Brain cancer Daughter 2  . Esophageal cancer Other   . Colon polyps Neg Hx   . Rectal cancer Neg Hx   . Stomach cancer Neg Hx   The patient's father died at age 46 from colon cancer which she never had treated. The patient's mother is living, 11 years old as of June 2018. The patient has one brother, one sister. On the mother's side there is a history of breast cancer in the grandmother, age 92, and then 1 aunt with breast cancer age 39, and another with lung cancer age 61. There is no history of ovarian cancer in the family.   GYNECOLOGIC HISTORY:  No LMP recorded (lmp unknown). Patient is postmenopausal. Menarche age 89, first live birth age 62. She is GX P4. Her periods have never been regular. She had  a period in June 2018 but not for 4 months before that. She took birth control for some years remotely with no complications. She is status post bilateral tubal ligation.   SOCIAL HISTORY:  The patient's husband owns and runs a food truck.. They serve mostly [place] and [place] food. 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 Her husband [name] (goes by "[name]") is originally from [name]. The patient has two children from her first marriage. Her son [name] "[name], who is 58 years old as of Oct 2021, was previously in the [employer], but now is taking courses at [employer] and  her 89 y/o daughter [name], who was studying psychology at [name] before her optic pathway germinoma, which has left her 97% blind.  She now runs a massage service called "[employer]." Together with [name] the patient has two other children who are 44 ([employer] scholarship, doing premed) and 72 as of October 2020    ADVANCED DIRECTIVES: Not in place   HEALTH MAINTENANCE: Social History   Tobacco Use  . Smoking status: Never Smoker  . Smokeless tobacco: Never Used  Vaping Use  . Vaping Use: Never used  Substance Use Topics  . Alcohol use: No  . Drug use: No     Colonoscopy: Referral placed 10/16/2019  ZHG:DJME 2018  Bone density:Never   Allergies  Allergen Reactions  . Carboplatin Other (See Comments)    Red, blotchy, hot    Current Outpatient Medications  Medication Sig Dispense Refill  . gabapentin (NEURONTIN) 100 MG capsule TAKE 2 CAPSULES BY MOUTH ONCE A DAY (Patient not taking: Reported on 11/29/2019) 60 capsule 1  . gabapentin (NEURONTIN) 300 MG capsule TAKE 2 CAPSULES (600 MG TOTAL) BY MOUTH AT BEDTIME. 90 capsule 4  . ibuprofen (ADVIL) 800 MG tablet Take 1 tablet (800 mg total) by mouth every 8 (eight) hours as needed for moderate pain. For AFTER surgery only (Patient not taking:  Reported on 11/29/2019) 30 tablet 0  . LORazepam (ATIVAN) 0.5 MG tablet Patient to take 1 tablet, 0.2m, 1 hour prior to MRI.   May repeat if necessary. (Patient not taking: Reported on 11/29/2019) 2 tablet 0  . senna-docusate (SENOKOT-S) 8.6-50 MG tablet Take 2 tablets by mouth at bedtime. For AFTER surgery, do not take if having diarrhea (Patient not taking: Reported on 11/29/2019) 30 tablet 0  . traMADol (ULTRAM) 50 MG tablet Take 1 tablet (50 mg total) by mouth every 6 (six) hours as needed for  severe pain. For AFTER surgery, do not take and drive (Patient not taking: Reported on 11/29/2019) 10 tablet 0   No current facility-administered medications for this visit.    OBJECTIVE: White woman in no acute distress  There were no vitals filed for this visit.   There is no height or weight on file to calculate BMI.   There were no vitals filed for this visit. ECOG FS:1 - Symptomatic but completely ambulatory  Telemedicine visit 12/15/2019   LAB RESULTS:  CMP     Component Value Date/Time   NA 137 12/05/2019 1154   NA 140 01/07/2017 1342   K 3.7 12/05/2019 1154   K 3.5 01/07/2017 1342   CL 101 12/05/2019 1154   CO2 30 12/05/2019 1154   CO2 27 01/07/2017 1342   GLUCOSE 117 (H) 12/05/2019 1154   GLUCOSE 148 (H) 01/07/2017 1342   BUN 11 12/05/2019 1154   BUN 6.2 (L) 01/07/2017 1342   CREATININE 0.73 12/05/2019 1154   CREATININE 0.95 10/16/2019 1347   CREATININE 0.7 01/07/2017 1342   CALCIUM 9.2 12/05/2019 1154   CALCIUM 9.4 01/07/2017 1342   PROT 7.3 12/05/2019 1154   PROT 7.0 01/07/2017 1342   ALBUMIN 4.0 12/05/2019 1154   ALBUMIN 3.7 01/07/2017 1342   AST 20 12/05/2019 1154   AST 16 10/16/2019 1347   AST 23 01/07/2017 1342   ALT 17 12/05/2019 1154   ALT 17 10/16/2019 1347   ALT 24 01/07/2017 1342   ALKPHOS 80 12/05/2019 1154   ALKPHOS 105 01/07/2017 1342   BILITOT 1.0 12/05/2019 1154   BILITOT 0.8 10/16/2019 1347   BILITOT 0.84 01/07/2017 1342   GFRNONAA >60  12/05/2019 1154   GFRNONAA >60 10/16/2019 1347   GFRAA >60 05/23/2019 1921   GFRAA >60 10/13/2018 1346    No results found for: TOTALPROTELP, ALBUMINELP, A1GS, A2GS, BETS, BETA2SER, GAMS, MSPIKE, SPEI  No results found for: Nils Pyle, [facility]  Lab Results  Component Value Date   WBC 3.9 (L) 12/05/2019   NEUTROABS 3.5 10/16/2019   HGB 13.6 12/05/2019   HCT 40.1 12/05/2019   MCV 90.3 12/05/2019   PLT 156 12/05/2019      Chemistry      Component Value Date/Time   NA 137 12/05/2019 1154   NA 140 01/07/2017 1342   K 3.7 12/05/2019 1154   K 3.5 01/07/2017 1342   CL 101 12/05/2019 1154   CO2 30 12/05/2019 1154   CO2 27 01/07/2017 1342   BUN 11 12/05/2019 1154   BUN 6.2 (L) 01/07/2017 1342   CREATININE 0.73 12/05/2019 1154   CREATININE 0.95 10/16/2019 1347   CREATININE 0.7 01/07/2017 1342      Component Value Date/Time   CALCIUM 9.2 12/05/2019 1154   CALCIUM 9.4 01/07/2017 1342   ALKPHOS 80 12/05/2019 1154   ALKPHOS 105 01/07/2017 1342   AST 20 12/05/2019 1154   AST 16 10/16/2019 1347   AST 23 01/07/2017 1342   ALT 17 12/05/2019 1154   ALT 17 10/16/2019 1347   ALT 24 01/07/2017 1342   BILITOT 1.0 12/05/2019 1154   BILITOT 0.8 10/16/2019 1347   BILITOT 0.84 01/07/2017 1342       No results found for: LABCA2  No components found for: [id number]  No results for input(s): INR in the last 168 hours.  Urinalysis    Component Value Date/Time   COLORURINE YELLOW 12/05/2019 1154   APPEARANCEUR CLOUDY (A) 12/05/2019 1154   LABSPEC 1.024 12/05/2019 1154  LABSPEC 1.025 11/23/2016 1251   PHURINE 5.0 12/05/2019 1154   GLUCOSEU NEGATIVE 12/05/2019 1154   GLUCOSEU Negative 11/23/2016 1251   HGBUR SMALL (A) 12/05/2019 1154   BILIRUBINUR NEGATIVE 12/05/2019 1154   BILIRUBINUR Negative 11/23/2016 1251   KETONESUR NEGATIVE 12/05/2019 1154   PROTEINUR NEGATIVE 12/05/2019 1154   UROBILINOGEN 0.2 11/23/2016 1251   NITRITE NEGATIVE 12/05/2019 1154    LEUKOCYTESUR NEGATIVE 12/05/2019 1154   LEUKOCYTESUR Negative 11/23/2016 1251    STUDIES: NM Bone Scan Whole Body  Result Date: 12/11/2019 CLINICAL DATA:  Breast cancer staging. Initial diagnosis in 2018 with recurrence last month. EXAM: NUCLEAR MEDICINE WHOLE BODY BONE SCAN TECHNIQUE: Whole body anterior and posterior images were obtained approximately 3 hours after intravenous injection of radiopharmaceutical. RADIOPHARMACEUTICALS:  21.5 mCi Technetium-51mMDP IV COMPARISON:  Whole-body bone scan 12/20/2017. Abdominopelvic CT 01/18/2019. FINDINGS: There is no osseous activity favoring metastatic disease. There is scattered mild degenerative activity within the shoulders and knees. There is stable mild degenerative activity within the spine. Activity within the left calcaneal tuberosity is likely reactive. The soft tissue activity appears unremarkable. IMPRESSION: Stable bone scan demonstrating scattered mild degenerative activity. No evidence of osseous metastatic disease. Electronically Signed   By: WRichardean SaleM.D.   On: 12/11/2019 20:12   MR BREAST BILATERAL W WO CONTRAST INC CAD  Result Date: 11/16/2019 CLINICAL DATA:  High risk screening MRI. Patient with a personal history of breast cancer in 2019 status post lumpectomy and radiation. No current problems. LABS:  None. EXAM: BILATERAL BREAST MRI WITH AND WITHOUT CONTRAST TECHNIQUE: Multiplanar, multisequence MR images of both breasts were obtained prior to and following the intravenous administration of 10 ml of Gadavist Three-dimensional MR images were rendered by post-processing of the original MR data on an independent workstation. The three-dimensional MR images were interpreted, and findings are reported in the following complete MRI report for this study. Three dimensional images were evaluated at the independent interpreting workstation using the DynaCAD thin client. COMPARISON:  Previous exam(s). FINDINGS: Breast composition: b.  Scattered fibroglandular tissue. Background parenchymal enhancement: Mild Right breast: In the slightly outer right breast posterior depth there is clumped non mass enhancement measuring approximately 1.9 x 0.8 cm (series 9, image 68) with benign kinetics. There are no additional abnormal areas of enhancement in the right breast. Left breast: Postsurgical changes are identified in the upper-outer quadrant of the left breast. There is mild diffuse left breast skin thickening without significant enhancement, consistent with post radiation changes. Several areas of susceptibility artifact correspond to surgical clips. There is a small amount of rim enhancement adjacent to surgical clips in the upper outer breast with central fat, consistent with benign fat necrosis (series 9, image 66). There is another area of susceptibility just superior and anterior to this (series 9, image 55) with an adjacent enhancing small nodule. The nodule is best characterized on the non subtracted series (series 5, image 55) and measures 0.7 x 0.4 x 0.5 cm with benign kinetics. Additionally in the upper outer left breast anterior depth there is new non mass enhancement measuring 2.4 x 0.7 x 0.8 cm (series 9, image 78) which demonstrates benign kinetics. Lymph nodes: No abnormal appearing lymph nodes. Ancillary findings:  None. IMPRESSION: 1. Indeterminate clumped non mass enhancement in the slightly outer right breast posterior depth measuring 1.9 x 0.8 cm. 2. New non mass enhancement in the upper outer left breast anterior depth measuring 2.4 x 0.7 x 0.8 cm. 3. Postsurgical changes in the upper-outer quadrant  of the left breast. There is an indeterminate small enhancing nodule measuring 0.7 cm adjacent to one of the surgical clips in the lumpectomy bed. RECOMMENDATION: MRI guided core needle biopsy x 3 for the non mass enhancement in the outer RIGHT breast, the non mass enhancement in the upper outer anterior LEFT breast and for the small  enhancing nodule in the LEFT lumpectomy bed. BI-RADS CATEGORY  4: Suspicious. Electronically Signed   By: Audie Pinto M.D.   On: 11/16/2019 11:54   MM CLIP PLACEMENT LEFT  Result Date: 11/24/2019 CLINICAL DATA:  Patient status post MRI guided core needle biopsy 2 sites left breast EXAM: DIAGNOSTIC LEFT MAMMOGRAM POST MRI BIOPSY COMPARISON:  Previous exam(s). FINDINGS: Mammographic images were obtained following MRI guided biopsy of 2 sites left breast. Site 1: Lateral left breast anterior: Barbell shaped clip: In appropriate position. Site 2: Lateral left breast posterior: Cylinder-shaped clip: In appropriate position. IMPRESSION: Appropriate position of the biopsy marking clips lateral left breast as above. Final Assessment: Post Procedure Mammograms for Marker Placement Electronically Signed   By: Lovey Newcomer M.D.   On: 11/24/2019 10:05   MM CLIP PLACEMENT RIGHT  Result Date: 11/24/2019 CLINICAL DATA:  Patient status post MRI guided core needle biopsy right breast non mass enhancement. EXAM: DIAGNOSTIC RIGHT MAMMOGRAM POST MRI BIOPSY COMPARISON:  Previous exam(s). FINDINGS: Mammographic images were obtained following MRI guided biopsy of right breast non mass enhancement. The biopsy marking clip is in expected position at the site of biopsy. IMPRESSION: Appropriate positioning of the barbell shaped biopsy marking clip at the site of biopsy in the lateral right breast. Final Assessment: Post Procedure Mammograms for Marker Placement Electronically Signed   By: Lovey Newcomer M.D.   On: 11/24/2019 10:07   MR LT BREAST BX W LOC DEV 1ST LESION IMAGE BX SPEC MR GUIDE  Addendum Date: 11/29/2019   ADDENDUM REPORT: 11/28/2019 12:31 ADDENDUM: Pathology revealed GRADE III INVASIVE DUCTAL CARCINOMA of the LEFT breast, lateral anterior. This was found to be concordant by Dr. Lovey Newcomer. 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 This was found to be concordant by Dr. Lovey Newcomer. Pathology revealed COMPLEX SCLEROSING LESION of the RIGHT breast, lateral. This was found to be concordant by Dr. Lovey Newcomer, with excision recommended. Pathology results were discussed with the patient by telephone. The patient reported doing well after the biopsies with tenderness and redness at the sites. Post biopsy instructions and care were reviewed and questions were answered. The patient was encouraged to call [facility] for any additional concerns. My direct phone number was provided. Surgical consultation has been arranged with Dr. Rolm Bookbinder at [facility] on December 22, 2019. Dr. Gunnar Bulla Brandii Lakey was notified of biopsy results via EPIC message on November 28, 2019. Pathology results reported by Terie Purser, RN on 11/28/2019. Electronically Signed   By: Lovey Newcomer M.D.   On: 11/28/2019 12:31   Result Date: 11/29/2019 CLINICAL DATA:  Patient for MRI guided core needle biopsy of 3 sites, 2 within the left breast and 1 within the right breast. EXAM: MRI GUIDED CORE NEEDLE BIOPSY OF THE BILATERAL BREAST TECHNIQUE: Multiplanar, multisequence MR imaging of the BILATERAL breast was performed both before and after administration of intravenous contrast. CONTRAST:  48m GADAVIST GADOBUTROL 1 MMOL/ML IV SOLN COMPARISON:  Previous exams. FINDINGS: I met with the patient, and we discussed the procedure of MRI guided biopsy, including risks, benefits, and alternatives. Specifically, we discussed  the risks of infection, bleeding, tissue injury, clip migration, and inadequate sampling. Informed, written consent was given. The usual time out protocol was performed immediately prior to the procedure. Site 1: Left breast lateral anterior: Barbell clip Using sterile technique, 1% Lidocaine, MRI guidance, and a 9 gauge vacuum assisted device, biopsy was performed of non mass enhancement outer left breast using a lateral approach. At the conclusion of the procedure, a  barbell shaped tissue marker clip was deployed into the biopsy cavity. Follow-up 2-view mammogram was performed and dictated separately. Site 2: Left breast lateral posterior: Cylinder clip Using sterile technique, 1% Lidocaine, MRI guidance, and a 9 gauge vacuum assisted device, biopsy was performed of small enhancing nodule lateral left breast using a lateral approach. At the conclusion of the procedure, a cylinder shaped tissue marker clip was deployed into the biopsy cavity. Follow-up 2-view mammogram was performed and dictated separately. Site 3: Right breast lateral: Barbell clip Using sterile technique, 1% Lidocaine, MRI guidance, and a 9 gauge vacuum assisted device, biopsy was performed of non mass enhancement outer right breast using a lateral approach. At the conclusion of the procedure, a barbell shaped tissue marker clip was deployed into the biopsy cavity. Follow-up 2-view mammogram was performed and dictated separately. IMPRESSION: MRI guided biopsy of 2 sites left breast and 1 site right breast as above. No apparent complications. Electronically Signed: By: Lovey Newcomer M.D. On: 11/24/2019 10:04   MR LT BREAST BX W LOC DEV EA ADD LESION IMAGE BX SPEC MR GUIDE  Addendum Date: 11/29/2019   ADDENDUM REPORT: 11/28/2019 12:31 ADDENDUM: Pathology revealed GRADE III INVASIVE DUCTAL CARCINOMA of the LEFT breast, lateral anterior. This was found to be concordant by Dr. Lovey Newcomer. 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 This was found to be concordant by Dr. Lovey Newcomer. Pathology revealed COMPLEX SCLEROSING LESION of the RIGHT breast, lateral. This was found to be concordant by Dr. Lovey Newcomer, with excision recommended. Pathology results were discussed with the patient by telephone. The patient reported doing well after the biopsies with tenderness and redness at the sites. Post biopsy instructions and care were reviewed and questions were answered. The patient was  encouraged to call [facility] for any additional concerns. My direct phone number was provided. Surgical consultation has been arranged with Dr. Rolm Bookbinder at [facility] on December 22, 2019. Dr. Gunnar Bulla Reshanda Lewey was notified of biopsy results via EPIC message on November 28, 2019. Pathology results reported by Terie Purser, RN on 11/28/2019. Electronically Signed   By: Lovey Newcomer M.D.   On: 11/28/2019 12:31   Result Date: 11/29/2019 CLINICAL DATA:  Patient for MRI guided core needle biopsy of 3 sites, 2 within the left breast and 1 within the right breast. EXAM: MRI GUIDED CORE NEEDLE BIOPSY OF THE BILATERAL BREAST TECHNIQUE: Multiplanar, multisequence MR imaging of the BILATERAL breast was performed both before and after administration of intravenous contrast. CONTRAST:  22m GADAVIST GADOBUTROL 1 MMOL/ML IV SOLN COMPARISON:  Previous exams. FINDINGS: I met with the patient, and we discussed the procedure of MRI guided biopsy, including risks, benefits, and alternatives. Specifically, we discussed the risks of infection, bleeding, tissue injury, clip migration, and inadequate sampling. Informed, written consent was given. The usual time out protocol was performed immediately prior to the procedure. Site 1: Left breast lateral anterior: Barbell clip Using sterile technique, 1% Lidocaine, MRI guidance, and a 9 gauge vacuum assisted device, biopsy was performed of  non mass enhancement outer left breast using a lateral approach. At the conclusion of the procedure, a barbell shaped tissue marker clip was deployed into the biopsy cavity. Follow-up 2-view mammogram was performed and dictated separately. Site 2: Left breast lateral posterior: Cylinder clip Using sterile technique, 1% Lidocaine, MRI guidance, and a 9 gauge vacuum assisted device, biopsy was performed of small enhancing nodule lateral left breast using a lateral approach. At the conclusion of the procedure,  a cylinder shaped tissue marker clip was deployed into the biopsy cavity. Follow-up 2-view mammogram was performed and dictated separately. Site 3: Right breast lateral: Barbell clip Using sterile technique, 1% Lidocaine, MRI guidance, and a 9 gauge vacuum assisted device, biopsy was performed of non mass enhancement outer right breast using a lateral approach. At the conclusion of the procedure, a barbell shaped tissue marker clip was deployed into the biopsy cavity. Follow-up 2-view mammogram was performed and dictated separately. IMPRESSION: MRI guided biopsy of 2 sites left breast and 1 site right breast as above. No apparent complications. Electronically Signed: By: Lovey Newcomer M.D. On: 11/24/2019 10:04   MR RT BREAST BX W LOC DEV 1ST LESION IMAGE BX SPEC MR GUIDE  Addendum Date: 11/29/2019   ADDENDUM REPORT: 11/28/2019 12:31 ADDENDUM: Pathology revealed GRADE III INVASIVE DUCTAL CARCINOMA of the LEFT breast, lateral anterior. This was found to be concordant by Dr. Lovey Newcomer. 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 This was found to be concordant by Dr. Lovey Newcomer. Pathology revealed COMPLEX SCLEROSING LESION of the RIGHT breast, lateral. This was found to be concordant by Dr. Lovey Newcomer, with excision recommended. Pathology results were discussed with the patient by telephone. The patient reported doing well after the biopsies with tenderness and redness at the sites. Post biopsy instructions and care were reviewed and questions were answered. The patient was encouraged to call [facility] for any additional concerns. My direct phone number was provided. Surgical consultation has been arranged with Dr. Rolm Bookbinder at [facility] on December 22, 2019. Dr. Gunnar Bulla Braya Habermehl was notified of biopsy results via EPIC message on November 28, 2019. Pathology results reported by Terie Purser, RN on 11/28/2019. Electronically Signed   By:  Lovey Newcomer M.D.   On: 11/28/2019 12:31   Result Date: 11/29/2019 CLINICAL DATA:  Patient for MRI guided core needle biopsy of 3 sites, 2 within the left breast and 1 within the right breast. EXAM: MRI GUIDED CORE NEEDLE BIOPSY OF THE BILATERAL BREAST TECHNIQUE: Multiplanar, multisequence MR imaging of the BILATERAL breast was performed both before and after administration of intravenous contrast. CONTRAST:  69m GADAVIST GADOBUTROL 1 MMOL/ML IV SOLN COMPARISON:  Previous exams. FINDINGS: I met with the patient, and we discussed the procedure of MRI guided biopsy, including risks, benefits, and alternatives. Specifically, we discussed the risks of infection, bleeding, tissue injury, clip migration, and inadequate sampling. Informed, written consent was given. The usual time out protocol was performed immediately prior to the procedure. Site 1: Left breast lateral anterior: Barbell clip Using sterile technique, 1% Lidocaine, MRI guidance, and a 9 gauge vacuum assisted device, biopsy was performed of non mass enhancement outer left breast using a lateral approach. At the conclusion of the procedure, a barbell shaped tissue marker clip was deployed into the biopsy cavity. Follow-up 2-view mammogram was performed and dictated separately. Site 2: Left breast lateral posterior: Cylinder clip Using sterile technique, 1% Lidocaine, MRI guidance, and a 9 gauge vacuum  assisted device, biopsy was performed of small enhancing nodule lateral left breast using a lateral approach. At the conclusion of the procedure, a cylinder shaped tissue marker clip was deployed into the biopsy cavity. Follow-up 2-view mammogram was performed and dictated separately. Site 3: Right breast lateral: Barbell clip Using sterile technique, 1% Lidocaine, MRI guidance, and a 9 gauge vacuum assisted device, biopsy was performed of non mass enhancement outer right breast using a lateral approach. At the conclusion of the procedure, a barbell shaped  tissue marker clip was deployed into the biopsy cavity. Follow-up 2-view mammogram was performed and dictated separately. IMPRESSION: MRI guided biopsy of 2 sites left breast and 1 site right breast as above. No apparent complications. Electronically Signed: By: Lovey Newcomer M.D. On: 11/24/2019 10:04     ELIGIBLE FOR AVAILABLE RESEARCH PROTOCOL: no   ASSESSMENT: 49 y.o. 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 Additional testing of the patient's mother and maternal aunt is pending. Otherwise [facility]'s Common Hereditary Cancers Panel found no deleterious mutations in APC, ATM, AXIN2, BARD1, BMPR1A, BRCA1, BRCA2, BRIP1, CDH1, CDKN2A, CHEK2, CTNNA1, DICER1, EPCAM, GREM1, HOXB13, KIT, MEN1, MLH1, MSH2, MSH3, MSH6, MUTYH, NBN, NF1, NTHL1, PALB2, PDGFRA, PMS2, POLD1, POLE, PTEN, RAD50, RAD51C, RAD51D, SDHA, SDHB, SDHC, SDHD, SMAD4, SMARCA4, STK11, TP53, TSC1, TSC2, and VHL.  (2) neoadjuvant chemotherapy to consist of doxorubicin and cyclophosphamide in dose dense fashion 4 completed 09/10/2016 to be followed by paclitaxel weekly 12 given with carboplatin   (a) cycle 4 of cyclophosphamide and doxorubicin was delayed 10 days and dose decreased 10% because of febrile neutropenia after cycle 3  (b) cycle 6 of Paclitaxel and Carboplatin delayed due to neutropenia, therefore Granix added to Wednesday, Thursday, Friday following chemotherapy days  (3) status post left lumpectomy and left axillary sentinel lymph node sampling 01/27/2017 with pathology showing a complete pathologic response (ypT0 ypN0); 5 left axillary  lymph nodes removed  (4) adjuvant radiation 03/11/2017-04/21/2017 Site/dose:   1. Left breast, 2 Gy in 25 fractions for a total dose of 50 Gy                      2. Boost, 2 Gy in 5 fractions for a total dose of 10 Gy  (5) BRCA2 positivity  (a) intensified screening for breast cancer initiated November 2021   (b) referral to dermatology placed 10/16/2019 for melanoma screening  (c) referral to ophthalmology for yearly funduscopic exam  (d) referral to gynecologic oncology 10/16/2019 for BSO  (e) bilateral mastectomies planned for January 2022  (6) highly suspicious cecal polyp biopsy 12/06/2019  (a) partial colectomy planned January 2022  SECOND LEFT BREAST CANCER: (7) status post left breast biopsy 11/24/2019 for an invasive ductal carcinoma, grade 3, estrogen receptor 80% positive, with weak staining intensity, HER-2 negative, progesterone receptor negative, with an MIB-1 of 90%.   PLAN: Rashia is having a hard time deciding on what kind of surgery she wants.  She has been scheduled to meet with plastics and we briefly reviewed her options today.  Of course she has already discussed these with her primary surgeon [name].  At this point  she just wants to have simple mastectomies.  She understands that doing this foregoes the possibility of nipple sparing or skin sparing mastectomies.  She thinks she may want reconstruction later in the future but is not clear about that.  I think it would be helpful if she did clarify that decision as it may affect Dr. Cristal Generous surgical options.  Also I urged her to go ahead and meet with plastics so she has a very full understanding of all her options.  In any case at this point she is facing 3 surgeries, a partial colectomy, an eventual bilateral salpingo-oophorectomy, and bilateral breast surgeries.  The most urgent of these is the bilateral mastectomies and hopefully this can be done in January.  The other 2 surgeries could be easily  combined.  We discussed the fact that her tumor is only weakly estrogen positive.  I think she will derive some small benefit from tamoxifen eventually but I do not think is starting her on tamoxifen now and then stopping it after 2 weeks so that it does not increase her risk of clotting from her surgery would really be helpful.  Instead we will go to tamoxifen once she is done with all her surgeries.  She has an appointment with me January 23.  I will be glad to see her then if that would be helpful for her in understanding her options and firming up her decisions.  Otherwise I will see her late January 2022.     Morganna Styles, [name], MD  12/14/19 2:59 PM Medical Oncology and Hematology [facility] [place],  [address] Tel. [phone]    Fax. [phone]   I, Wilburn Mylar, am acting as scribe for Dr. [name]. Sabella Traore.  I, Lurline Del MD, have reviewed the above documentation for accuracy and completeness, and I agree with the above.   *Total Encounter Time as defined by the Centers for Medicare and Medicaid Services includes, in addition to the face-to-face time of a patient visit (documented in the note above) non-face-to-face time: obtaining and reviewing outside history, ordering and reviewing medications, tests or procedures, care coordination (communications with other health care professionals or caregivers) and documentation in the medical record.

## 2019-12-15 ENCOUNTER — Inpatient Hospital Stay: Payer: Medicaid Other | Attending: Oncology | Admitting: Oncology

## 2019-12-15 ENCOUNTER — Other Ambulatory Visit: Payer: Self-pay | Admitting: Oncology

## 2019-12-15 DIAGNOSIS — Z853 Personal history of malignant neoplasm of breast: Secondary | ICD-10-CM | POA: Insufficient documentation

## 2019-12-15 DIAGNOSIS — Z171 Estrogen receptor negative status [ER-]: Secondary | ICD-10-CM

## 2019-12-15 DIAGNOSIS — Z1502 Genetic susceptibility to malignant neoplasm of ovary: Secondary | ICD-10-CM | POA: Diagnosis not present

## 2019-12-15 DIAGNOSIS — C50912 Malignant neoplasm of unspecified site of left female breast: Secondary | ICD-10-CM

## 2019-12-15 DIAGNOSIS — C50412 Malignant neoplasm of upper-outer quadrant of left female breast: Secondary | ICD-10-CM

## 2019-12-15 DIAGNOSIS — Z1501 Genetic susceptibility to malignant neoplasm of breast: Secondary | ICD-10-CM | POA: Diagnosis not present

## 2019-12-15 DIAGNOSIS — Z1509 Genetic susceptibility to other malignant neoplasm: Secondary | ICD-10-CM

## 2019-12-15 DIAGNOSIS — Z923 Personal history of irradiation: Secondary | ICD-10-CM | POA: Insufficient documentation

## 2019-12-16 ENCOUNTER — Other Ambulatory Visit: Payer: Self-pay | Admitting: General Surgery

## 2019-12-16 DIAGNOSIS — C50912 Malignant neoplasm of unspecified site of left female breast: Secondary | ICD-10-CM | POA: Insufficient documentation

## 2019-12-16 DIAGNOSIS — C50412 Malignant neoplasm of upper-outer quadrant of left female breast: Secondary | ICD-10-CM

## 2019-12-18 ENCOUNTER — Encounter: Payer: Self-pay | Admitting: *Deleted

## 2019-12-20 ENCOUNTER — Telehealth: Payer: Self-pay | Admitting: Gastroenterology

## 2019-12-20 NOTE — Telephone Encounter (Signed)
Dr Ardis Hughs the pt has a bilateral mastectomy scheduled for 01/09/20 and a colon on 01/11/20.  She would like your opinion on whether she can postpone the colon until she heals from the upcoming breast surgery. Please advise.  Of note your next available is 02/01/20

## 2019-12-20 NOTE — Telephone Encounter (Signed)
The pt has been rescheduled to 1/27 at 730 am COVID test moved to 1/24 at 1010 am. The pt has been advised and re instructed.  All information has also been sent to My Chart.

## 2019-12-20 NOTE — Telephone Encounter (Signed)
Yes, she is going to be in pretty rough shape after her double mastectomy.  Offer her that next available appointment at the end of January.  Thanks

## 2019-12-21 ENCOUNTER — Institutional Professional Consult (permissible substitution): Payer: Medicaid Other | Admitting: Plastic Surgery

## 2019-12-27 ENCOUNTER — Encounter (HOSPITAL_BASED_OUTPATIENT_CLINIC_OR_DEPARTMENT_OTHER): Payer: Self-pay | Admitting: General Surgery

## 2019-12-27 ENCOUNTER — Other Ambulatory Visit: Payer: Self-pay | Admitting: General Surgery

## 2019-12-27 ENCOUNTER — Other Ambulatory Visit: Payer: Self-pay

## 2019-12-27 ENCOUNTER — Other Ambulatory Visit: Payer: Self-pay | Admitting: *Deleted

## 2019-12-27 DIAGNOSIS — C50412 Malignant neoplasm of upper-outer quadrant of left female breast: Secondary | ICD-10-CM

## 2019-12-27 DIAGNOSIS — Z171 Estrogen receptor negative status [ER-]: Secondary | ICD-10-CM

## 2019-12-28 ENCOUNTER — Inpatient Hospital Stay: Payer: Medicaid Other

## 2019-12-28 ENCOUNTER — Other Ambulatory Visit: Payer: Self-pay

## 2019-12-28 ENCOUNTER — Inpatient Hospital Stay (HOSPITAL_BASED_OUTPATIENT_CLINIC_OR_DEPARTMENT_OTHER): Payer: Medicaid Other | Admitting: Oncology

## 2019-12-28 VITALS — BP 117/70 | HR 88 | Temp 98.0°F | Resp 19 | Ht 67.5 in | Wt 269.7 lb

## 2019-12-28 DIAGNOSIS — C50912 Malignant neoplasm of unspecified site of left female breast: Secondary | ICD-10-CM

## 2019-12-28 DIAGNOSIS — C50412 Malignant neoplasm of upper-outer quadrant of left female breast: Secondary | ICD-10-CM

## 2019-12-28 DIAGNOSIS — Z853 Personal history of malignant neoplasm of breast: Secondary | ICD-10-CM | POA: Diagnosis not present

## 2019-12-28 DIAGNOSIS — Z171 Estrogen receptor negative status [ER-]: Secondary | ICD-10-CM | POA: Diagnosis not present

## 2019-12-28 LAB — CBC WITH DIFFERENTIAL (CANCER CENTER ONLY)
Abs Immature Granulocytes: 0.01 10*3/uL (ref 0.00–0.07)
Basophils Absolute: 0 10*3/uL (ref 0.0–0.1)
Basophils Relative: 1 %
Eosinophils Absolute: 0.1 10*3/uL (ref 0.0–0.5)
Eosinophils Relative: 3 %
HCT: 39.8 % (ref 36.0–46.0)
Hemoglobin: 13.4 g/dL (ref 12.0–15.0)
Immature Granulocytes: 0 %
Lymphocytes Relative: 36 %
Lymphs Abs: 1.4 10*3/uL (ref 0.7–4.0)
MCH: 29.6 pg (ref 26.0–34.0)
MCHC: 33.7 g/dL (ref 30.0–36.0)
MCV: 88.1 fL (ref 80.0–100.0)
Monocytes Absolute: 0.3 10*3/uL (ref 0.1–1.0)
Monocytes Relative: 7 %
Neutro Abs: 2 10*3/uL (ref 1.7–7.7)
Neutrophils Relative %: 53 %
Platelet Count: 175 10*3/uL (ref 150–400)
RBC: 4.52 MIL/uL (ref 3.87–5.11)
RDW: 13.1 % (ref 11.5–15.5)
WBC Count: 3.8 10*3/uL — ABNORMAL LOW (ref 4.0–10.5)
nRBC: 0 % (ref 0.0–0.2)

## 2019-12-28 LAB — CMP (CANCER CENTER ONLY)
ALT: 19 U/L (ref 0–44)
AST: 20 U/L (ref 15–41)
Albumin: 3.7 g/dL (ref 3.5–5.0)
Alkaline Phosphatase: 96 U/L (ref 38–126)
Anion gap: 8 (ref 5–15)
BUN: 10 mg/dL (ref 6–20)
CO2: 29 mmol/L (ref 22–32)
Calcium: 9.4 mg/dL (ref 8.9–10.3)
Chloride: 102 mmol/L (ref 98–111)
Creatinine: 0.79 mg/dL (ref 0.44–1.00)
GFR, Estimated: >60 mL/min (ref >60)
Glucose, Bld: 140 mg/dL — ABNORMAL HIGH (ref 70–99)
Potassium: 4 mmol/L (ref 3.5–5.1)
Sodium: 139 mmol/L (ref 135–145)
Total Bilirubin: 0.9 mg/dL (ref 0.3–1.2)
Total Protein: 7.4 g/dL (ref 6.5–8.1)

## 2019-12-28 NOTE — Progress Notes (Signed)
[place]  [phone] [phone]) [phone]     ID: [name] DOB: May 23, 1970  MR#: [id number]  FUX#:[id number]  Patient Care Team: Sarah Pier, MD as PCP - General (Internal Medicine) [name], Sarah Dad, MD as Consulting Physician (Oncology) Sarah Gibson, MD as Attending Physician (Radiation Oncology) [name] Sarah Massed, NP as Nurse Practitioner (Hematology and Oncology) Sarah Bookbinder, MD as Consulting Physician (General Surgery) [name] Sarah Dunn, MD as Consulting Physician (Plastic Surgery) Sarah Kaufmann, RN as Oncology Nurse Navigator Sarah Germany, RN as Oncology Nurse Navigator OTHER MD:   CHIEF COMPLAINT: Triple negative breast cancer; BRCA2 positive  CURRENT TREATMENT: Awaiting definitive surgery   INTERVAL HISTORY: [name] returns today for a follow-up of her triple negative breast cancer accompanied by her husband [name].   She is scheduled for bilateral mastectomies on 01/09/2020 under [name].  She is also scheduled for colonoscopy on 12/31/2020 under Dr. Ardis Hughs.  Her bilateral salpingo-oophorectomy and hysterectomy has been postponed until after the above procedures have been completed.   REVIEW OF SYSTEMS: [name] has had a difficult time dealing with her repeat cancer diagnosis.  She has met with me on several occasions and also with [name] her surgeon.  At this point she is fairly clear that she wants to have bilateral mastectomies and she does not want immediate reconstruction.  She may or may not want reconstruction 2 or 3 years later she says.  Today we discussed the issue of chemotherapy in more detail as noted below.   COVID 19 VACCINATION STATUS:    BREAST CANCER HISTORY: From the original intake note:  The patient was seen in the emergency room 05/16/2016 with nonspecific complaints but also noting that she had a tender lump in her lateral left breast which she said she had noted the  day before. Exam by the emergency room physician confirmed a 3 cm soft left lateral breast mass without overlying erythema or nipple changes. This was felt to be most consistent with fibrocystic change but the patient was referred back to her primary care physician for further evaluation. She saw the physician assistant in May 18 and then [name] on 06/12/2016 who scheduled a bilateral diagnostic mammography with tomography and left breast ultrasonography at the [facility] 06/18/2016. This found the breast density to be category B. In the left breast upper outer quadrant there was an area of asymmetry measuring up to 10.3 cm. On exam this was firm and palpable and on ultrasound there was an irregular mass measuring 2.5 cm with some subtle changes in the surrounding tissue. In the left lower axilla there were 2 suspicious-looking lymph nodes.  Biopsy of the left breast upper outer quadrant and one of the suspicious lymph nodes 06/19/2016 showed ([id number]) both to be involved by invasive ductal carcinoma, grade 3, estrogen and progesterone receptor negative, HER-2 not amplified with a signals ratio being 1.53-1.74 and the number per cell 2.35-3.05. The MIB-1 was 90%.  The patient's subsequent history is as detailed below.   PAST MEDICAL HISTORY: Past Medical History:  Diagnosis Date  . Arthritis   . Breast cancer ([place])    Left Breast Cancer  . Breast cancer ([place]) 11/24/2019   left   . Complication of anesthesia    DAY SURGERY 2010 OR 2011 ASPIRATED AND STAYED OVERNIGHT  . History of radiation therapy 03/11/17- 04/21/17   Left breast, 2 Gy in 25 fractions for a total dose of 50 Gy, Boost, 2 Gy in  5 fractions for a total dose of 10 Gy  . Hypertension    OFF MEDS  SINCE CHEMO X 5 MONTHS  . Malignant neoplasm of upper-outer quadrant of left breast in female, estrogen receptor negative ([place]) 06/23/2016  . Personal history of chemotherapy 2019   Left Breast Cancer  07-2016-12-2016  . Personal  history of radiation therapy 2019   Left Breast Cancer    PAST SURGICAL HISTORY: Past Surgical History:  Procedure Laterality Date  . ANKLE SURGERY    . BREAST LUMPECTOMY Left 01/27/2017  . BREAST LUMPECTOMY WITH RADIOACTIVE SEED AND SENTINEL LYMPH NODE BIOPSY Left 01/27/2017   Procedure: LEFT BREAST LUMPECTOMY WITH RADIOACTIVE SEED LOCALIZATION, LEFT AXILLARY DEEP LYMPH NODE BIOPSY WITH RADIOACTIVE SEED LOCALIZATION, LEFT AXILLARY SENTINEL LYMPH NODE MAPPING AND BIOPSY WITH BLUE DYE INJECTION;  Surgeon: Sarah Skates, MD;  Location: [facility];  Service: General;  Laterality: Left;  . IR FLUORO GUIDE PORT INSERTION RIGHT  07/14/2016  . IR US GUIDE VASC ACCESS RIGHT  07/14/2016  . PORT-A-CATH REMOVAL Right 01/27/2017   Procedure: REMOVAL PORT-A-CATH;  Surgeon: Sarah Skates, MD;  Location: [facility];  Service: General;  Laterality: Right;  . TUBAL LIGATION    . WISDOM TOOTH EXTRACTION      FAMILY HISTORY Family History  Problem Relation Age of Onset  . Diabetes Mother   . Hypertension Mother   . Arthritis Mother   . Colon cancer Father 50       died .76 metastatic at time of diagnosis  . Diabetes Father   . Heart disease Father   . Hypertension Father   . Breast cancer Maternal Grandmother 49       d.60s  . Breast cancer Maternal Aunt 52  . Cervical cancer Maternal Aunt 52  . Breast cancer Maternal Aunt 46       d.50s  . Cancer Maternal Uncle        d.62s unspecified type of cancer  . Breast cancer Cousin 70       paternal first-cousin (daughter of unaffected aunt)  . Cancer Maternal Aunt 55       "Female Cancer"  . Cancer Maternal Aunt        unknown cancer  . Brain cancer Daughter 29  . Esophageal cancer Other   . Colon polyps Neg Hx   . Rectal cancer Neg Hx   . Stomach cancer Neg Hx   The patient's father died at age 47 from colon cancer which she never had treated. The patient's mother is living, 30 years old as of June 2018. The patient has one brother, one sister. On the  mother's side there is a history of breast cancer in the grandmother, age 39, and then 1 aunt with breast cancer age 49, and another with lung cancer age 69. There is no history of ovarian cancer in the family.   GYNECOLOGIC HISTORY:  No LMP recorded (lmp unknown). Patient is postmenopausal. Menarche age 28, first live birth age 32. She is GX P4. Her periods have never been regular. She had a period in June 2018 but not for 4 months before that. She took birth control for some years remotely with no complications. She is status post bilateral tubal ligation.   SOCIAL HISTORY:  The patient's husband owns and runs a food truck.. They serve mostly [place] and [place] food. 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 Her husband Navjot Pilgrim (goes by "[name]") is originally  from [name]. The patient has two children from her first marriage. Her son [name], who is 55 years old as of Oct 2021, was previously in the [employer], but now is taking courses at [employer] and  her 52 y/o daughter [name], who was studying psychology at [name] before her optic pathway germinoma, which has left her 97% blind.  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 scholarship, doing premed) and 41 as of October 2020    ADVANCED DIRECTIVES: In the absence of any documents to the contrary the patient's husband is her healthcare power of attorney   HEALTH MAINTENANCE: Social History   Tobacco Use  . Smoking status: Never Smoker  . Smokeless tobacco: Never Used  Vaping Use  . Vaping Use: Never used  Substance Use Topics  . Alcohol use: No  . Drug use: No     Colonoscopy: Referral placed 10/16/2019  VZD:GLOV 2018  Bone density:Never   Allergies  Allergen Reactions  . Carboplatin Other (See Comments)    Red,  blotchy, hot    Current Outpatient Medications  Medication Sig Dispense Refill  . gabapentin (NEURONTIN) 100 MG capsule TAKE 2 CAPSULES BY MOUTH ONCE A DAY 60 capsule 1  . gabapentin (NEURONTIN) 300 MG capsule TAKE 2 CAPSULES (600 MG TOTAL) BY MOUTH AT BEDTIME. 90 capsule 4  . ibuprofen (ADVIL) 800 MG tablet Take 1 tablet (800 mg total) by mouth every 8 (eight) hours as needed for moderate pain. For AFTER surgery only (Patient not taking: No sig reported) 30 tablet 0  . LORazepam (ATIVAN) 0.5 MG tablet Patient to take 1 tablet, 0.38m, 1 hour prior to MRI.   May repeat if necessary. (Patient not taking: No sig reported) 2 tablet 0  . senna-docusate (SENOKOT-S) 8.6-50 MG tablet Take 2 tablets by mouth at bedtime. For AFTER surgery, do not take if having diarrhea (Patient not taking: No sig reported) 30 tablet 0  . traMADol (ULTRAM) 50 MG tablet Take 1 tablet (50 mg total) by mouth every 6 (six) hours as needed for severe pain. For AFTER surgery, do not take and drive (Patient not taking: No sig reported) 10 tablet 0   No current facility-administered medications for this visit.    OBJECTIVE: White woman who appears well  Vitals:   12/28/19 1348  BP: 117/70  Pulse: 88  Resp: 19  Temp: 98 F (36.7 C)  SpO2: 99%     Body mass index is 41.62 kg/m.   Filed Weights   12/28/19 1348  Weight: 269 lb 11.2 oz (122.3 kg)   ECOG FS:1 - Symptomatic but completely ambulatory  Sclerae unicteric, EOMs intact Wearing a mask No cervical or supraclavicular adenopathy Lungs no rales or rhonchi Heart regular rate and rhythm Abd soft, nontender, positive bowel sounds MSK no focal spinal tenderness, no upper extremity lymphedema Neuro: nonfocal, well oriented, appropriate affect Breasts: Deferred   LAB RESULTS:  CMP     Component Value Date/Time   NA 139 12/28/2019 1322   NA 140 01/07/2017 1342   K 4.0 12/28/2019 1322   K 3.5 01/07/2017 1342   CL 102 12/28/2019 1322   CO2 29 12/28/2019  1322   CO2 27 01/07/2017 1342   GLUCOSE 140 (H) 12/28/2019 1322   GLUCOSE 148 (H) 01/07/2017 1342   BUN 10 12/28/2019 1322   BUN 6.2 (L) 01/07/2017 1342  CREATININE 0.79 12/28/2019 1322   CREATININE 0.7 01/07/2017 1342   CALCIUM 9.4 12/28/2019 1322   CALCIUM 9.4 01/07/2017 1342   PROT 7.4 12/28/2019 1322   PROT 7.0 01/07/2017 1342   ALBUMIN 3.7 12/28/2019 1322   ALBUMIN 3.7 01/07/2017 1342   AST 20 12/28/2019 1322   AST 23 01/07/2017 1342   ALT 19 12/28/2019 1322   ALT 24 01/07/2017 1342   ALKPHOS 96 12/28/2019 1322   ALKPHOS 105 01/07/2017 1342   BILITOT 0.9 12/28/2019 1322   BILITOT 0.84 01/07/2017 1342   GFRNONAA >60 12/28/2019 1322   GFRAA >60 05/23/2019 1921   GFRAA >60 10/13/2018 1346    No results found for: TOTALPROTELP, ALBUMINELP, A1GS, A2GS, BETS, BETA2SER, GAMS, MSPIKE, SPEI  No results found for: Nils Pyle, [facility]  Lab Results  Component Value Date   WBC 3.8 (L) 12/28/2019   NEUTROABS 2.0 12/28/2019   HGB 13.4 12/28/2019   HCT 39.8 12/28/2019   MCV 88.1 12/28/2019   PLT 175 12/28/2019      Chemistry      Component Value Date/Time   NA 139 12/28/2019 1322   NA 140 01/07/2017 1342   K 4.0 12/28/2019 1322   K 3.5 01/07/2017 1342   CL 102 12/28/2019 1322   CO2 29 12/28/2019 1322   CO2 27 01/07/2017 1342   BUN 10 12/28/2019 1322   BUN 6.2 (L) 01/07/2017 1342   CREATININE 0.79 12/28/2019 1322   CREATININE 0.7 01/07/2017 1342      Component Value Date/Time   CALCIUM 9.4 12/28/2019 1322   CALCIUM 9.4 01/07/2017 1342   ALKPHOS 96 12/28/2019 1322   ALKPHOS 105 01/07/2017 1342   AST 20 12/28/2019 1322   AST 23 01/07/2017 1342   ALT 19 12/28/2019 1322   ALT 24 01/07/2017 1342   BILITOT 0.9 12/28/2019 1322   BILITOT 0.84 01/07/2017 1342       No results found for: LABCA2  No components found for: IWLNLG921  No results for input(s): INR in the last 168 hours.  Urinalysis    Component Value Date/Time   COLORURINE  YELLOW 12/05/2019 1154   APPEARANCEUR CLOUDY (A) 12/05/2019 1154   LABSPEC 1.024 12/05/2019 1154   LABSPEC 1.025 11/23/2016 1251   PHURINE 5.0 12/05/2019 1154   GLUCOSEU NEGATIVE 12/05/2019 1154   GLUCOSEU Negative 11/23/2016 1251   HGBUR SMALL (A) 12/05/2019 1154   BILIRUBINUR NEGATIVE 12/05/2019 1154   BILIRUBINUR Negative 11/23/2016 1251   KETONESUR NEGATIVE 12/05/2019 1154   PROTEINUR NEGATIVE 12/05/2019 1154   UROBILINOGEN 0.2 11/23/2016 1251   NITRITE NEGATIVE 12/05/2019 1154   LEUKOCYTESUR NEGATIVE 12/05/2019 1154   LEUKOCYTESUR Negative 11/23/2016 1251    STUDIES: NM Bone Scan Whole Body  Result Date: 12/11/2019 CLINICAL DATA:  Breast cancer staging. Initial diagnosis in 2018 with recurrence last month. EXAM: NUCLEAR MEDICINE WHOLE BODY BONE SCAN TECHNIQUE: Whole body anterior and posterior images were obtained approximately 3 hours after intravenous injection of radiopharmaceutical. RADIOPHARMACEUTICALS:  21.5 mCi Technetium-82mMDP IV COMPARISON:  Whole-body bone scan 12/20/2017. Abdominopelvic CT 01/18/2019. FINDINGS: There is no osseous activity favoring metastatic disease. There is scattered mild degenerative activity within the shoulders and knees. There is stable mild degenerative activity within the spine. Activity within the left calcaneal tuberosity is likely reactive. The soft tissue activity appears unremarkable. IMPRESSION: Stable bone scan demonstrating scattered mild degenerative activity. No evidence of osseous metastatic disease. Electronically Signed   By: WRichardean SaleM.D.   On: 12/11/2019 20:12  ELIGIBLE FOR AVAILABLE RESEARCH PROTOCOL: no   ASSESSMENT: 49 y.o. [place] woman status post left breast upper outer quadrant and left axillary lymph node biopsy 06/19/2016, both positive for a clinical T2-T3 N1, stage IIIB-C invasive ductal carcinoma, grade 3, triple negative, with an MIB-1 of 90%  (1) genetics testing 08/04/2016 showed a variant of  uncertain significance in the BRCA2 namely c.8169T>A (p.[id number]). 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 Additional testing of the patient's mother and maternal aunt is pending. Otherwise [facility]'s Common Hereditary Cancers Panel found no deleterious mutations in APC, ATM, AXIN2, BARD1, BMPR1A, BRCA1, BRCA2, BRIP1, CDH1, CDKN2A, CHEK2, CTNNA1, DICER1, EPCAM, GREM1, HOXB13, KIT, MEN1, MLH1, MSH2, MSH3, MSH6, MUTYH, NBN, NF1, NTHL1, PALB2, PDGFRA, PMS2, POLD1, POLE, PTEN, RAD50, RAD51C, RAD51D, SDHA, SDHB, SDHC, SDHD, SMAD4, SMARCA4, STK11, TP53, TSC1, TSC2, and VHL.  (2) neoadjuvant chemotherapy to consist of doxorubicin and cyclophosphamide in dose dense fashion 4 completed 09/10/2016 to be followed by paclitaxel weekly 12 given with carboplatin   (a) cycle 4 of cyclophosphamide and doxorubicin was delayed 10 days and dose decreased 10% because of febrile neutropenia after cycle 3  (b) cycle 6 of Paclitaxel and Carboplatin delayed due to neutropenia, therefore Granix added to Wednesday, Thursday, Friday following chemotherapy days  (3) status post left lumpectomy and left axillary sentinel lymph node sampling 01/27/2017 with pathology showing a complete pathologic response (ypT0 ypN0); 5 left axillary lymph nodes removed  (4) adjuvant radiation 03/11/2017-04/21/2017 Site/dose:   1. Left breast, 2 Gy in 25 fractions for a total dose of 50 Gy                      2. Boost, 2 Gy in 5 fractions for a total dose of 10 Gy  (5) BRCA2 positivity  (a) intensified screening for breast cancer initiated November 2021   (b) referral to dermatology placed 10/16/2019 for melanoma screening  (c) referral to ophthalmology for yearly funduscopic exam  (d) referral to gynecologic oncology 10/16/2019 for BSO  (e) bilateral mastectomies planned for January 2022  (6) highly suspicious cecal polyp biopsy 12/06/2019  (a) partial colectomy planned January 2022  SECOND  LEFT BREAST CANCER: (7) status post left breast biopsy 11/24/2019 for an invasive ductal carcinoma, grade 3, estrogen receptor 80% positive, with weak staining intensity, HER-2 negative, progesterone receptor negative, with an MIB-1 of 90%.  (8) definitive surgery pending  (9) adjuvant therapy anticipated with cyclophosphamide, methotrexate, fluorouracil (CMF) given every 21 days x 8   PLAN: [name] has considered all her surgical alternatives and at this point she does seem to have settled on bilateral mastectomies without immediate reconstruction.  She does not think that she will have a problem with the change in body image and she knows that she can consider reconstruction in the future although at this point she does not think she would want to undergo that  We discussed the fact that her breast cancer is estrogen receptor only weakly positive and progesterone receptor negative.  I tend to treat these cancers as triple negative although the estrogen positivity in her case was 80%.  Nevertheless I think she would be best served if she did receive adjuvant chemotherapy and we specifically discussed CMF.  She has a good understanding of the possible toxicities side effects and complications of this combination regimen.  After our discussion today she feels (and [name] certainly encourages in this) that she will want adjuvant chemotherapy and at this  point then would want to have the port put in at the same time as her definitive surgery.  We have gone back and forth on this before but this time she feels much more secure in her decision and I have sent [name] her surgeon in note asking if possible to have the port put in at the same time as the mastectomies  I am going to catch up with her once she is recovering from her surgery with a virtual visit January 20.  She is scheduled for colonoscopy in the week following that, depending on how she is doing and of course she still has the gynecologic  surgery to think of.  I anticipate her chemotherapy likely will start early February.  CMF is mild enough that she could undergo some of these other procedures in between chemotherapy treatments  She knows to call for any other issue that may develop before the next visit  Total encounter time 35 minutes.*   Loree Shehata, Sarah Dad, MD  12/28/19 6:15 PM Medical Oncology and Hematology [facility] [place], [place] [address] Tel. [phone]    Fax. [phone]   I, Wilburn Mylar, am acting as scribe for Dr. Virgie Khan. Rhonin Trott.  I, Lurline Del MD, have reviewed the above documentation for accuracy and completeness, and I agree with the above.   *Total Encounter Time as defined by the Centers for Medicare and Medicaid Services includes, in addition to the face-to-face time of a patient visit (documented in the note above) non-face-to-face time: obtaining and reviewing outside history, ordering and reviewing medications, tests or procedures, care coordination (communications with other health care professionals or caregivers) and documentation in the medical record.

## 2020-01-01 ENCOUNTER — Telehealth: Payer: Self-pay | Admitting: Oncology

## 2020-01-01 NOTE — Telephone Encounter (Signed)
Scheduled appts per 12/23 los. Pt confirmed appt date and time.   

## 2020-01-03 ENCOUNTER — Encounter: Payer: Medicaid Other | Admitting: Gastroenterology

## 2020-01-03 NOTE — Progress Notes (Signed)

## 2020-01-04 ENCOUNTER — Encounter: Payer: Medicaid Other | Admitting: Gynecologic Oncology

## 2020-01-05 ENCOUNTER — Other Ambulatory Visit (HOSPITAL_COMMUNITY)
Admission: RE | Admit: 2020-01-05 | Discharge: 2020-01-05 | Disposition: A | Discharge status: Home / Self Care | Payer: Medicaid Other | Source: Ambulatory Visit | Attending: General Surgery | Admitting: General Surgery

## 2020-01-05 DIAGNOSIS — Z01812 Encounter for preprocedural laboratory examination: Secondary | ICD-10-CM | POA: Diagnosis not present

## 2020-01-05 DIAGNOSIS — Z20822 Contact with and (suspected) exposure to covid-19: Secondary | ICD-10-CM | POA: Diagnosis not present

## 2020-01-05 LAB — SARS CORONAVIRUS 2 (TAT 6-24 HRS): SARS Coronavirus 2: NEGATIVE

## 2020-01-08 ENCOUNTER — Other Ambulatory Visit: Payer: Self-pay | Admitting: Oncology

## 2020-01-08 ENCOUNTER — Other Ambulatory Visit (HOSPITAL_COMMUNITY): Payer: Medicaid Other

## 2020-01-08 DIAGNOSIS — C50412 Malignant neoplasm of upper-outer quadrant of left female breast: Secondary | ICD-10-CM

## 2020-01-08 MED FILL — GABAPENTIN 300 MG CAPSULE: 300 | 45 days supply | Qty: 90 | Fill #3

## 2020-01-08 NOTE — Anesthesia Preprocedure Evaluation (Addendum)
Anesthesia Evaluation  Patient identified by MRN, date of birth, ID band Patient awake    Reviewed: Allergy & Precautions, NPO status , Patient's Chart, lab work & pertinent test results  History of Anesthesia Complications (+) history of anesthetic complications  Airway Mallampati: III  TM Distance: >3 FB Neck ROM: Full    Dental no notable dental hx. (+) Teeth Intact, Dental Advisory Given   Pulmonary neg pulmonary ROS,    Pulmonary exam normal breath sounds clear to auscultation       Cardiovascular Exercise Tolerance: Good hypertension, Normal cardiovascular exam Rhythm:Regular Rate:Normal     Neuro/Psych negative neurological ROS  negative psych ROS   GI/Hepatic negative GI ROS, Neg liver ROS,   Endo/Other  negative endocrine ROS  Renal/GU negative Renal ROS     Musculoskeletal  (+) Arthritis ,   Abdominal (+) + obese,   Peds  Hematology Lab Results      Component                Value               Date                      WBC                      3.8 (L)             12/28/2019                HGB                      13.4                12/28/2019                HCT                      39.8                12/28/2019                MCV                      88.1                12/28/2019                PLT                      175                 12/28/2019              Anesthesia Other Findings Breast CA   Reproductive/Obstetrics                            Anesthesia Physical Anesthesia Plan  ASA: III  Anesthesia Plan: General   Post-op Pain Management:  Regional for Post-op pain   Induction: Intravenous  PONV Risk Score and Plan: 4 or greater and Treatment may vary due to age or medical condition, Ondansetron, Dexamethasone, Midazolam and Scopolamine patch - Pre-op  Airway Management Planned: Oral ETT and LMA  Additional Equipment: None  Intra-op Plan:    Post-operative Plan: Extubation in OR  Informed Consent: I have reviewed the patients History  and Physical, chart, labs and discussed the procedure including the risks, benefits and alternatives for the proposed anesthesia with the patient or authorized representative who has indicated his/her understanding and acceptance.     Dental advisory given  Plan Discussed with: CRNA and Anesthesiologist  Anesthesia Plan Comments: (GA plus bilat pec blocks)       Anesthesia Quick Evaluation

## 2020-01-09 ENCOUNTER — Other Ambulatory Visit: Payer: Self-pay

## 2020-01-09 ENCOUNTER — Ambulatory Visit (HOSPITAL_BASED_OUTPATIENT_CLINIC_OR_DEPARTMENT_OTHER): Payer: Medicaid Other | Admitting: Anesthesiology

## 2020-01-09 ENCOUNTER — Encounter (HOSPITAL_COMMUNITY)
Admission: RE | Admit: 2020-01-09 | Discharge: 2020-01-09 | Disposition: A | Discharge status: Home / Self Care | Payer: Medicaid Other | Source: Ambulatory Visit | Attending: General Surgery | Admitting: General Surgery

## 2020-01-09 ENCOUNTER — Other Ambulatory Visit: Payer: Self-pay | Admitting: *Deleted

## 2020-01-09 ENCOUNTER — Encounter (HOSPITAL_BASED_OUTPATIENT_CLINIC_OR_DEPARTMENT_OTHER)
Admission: RE | Disposition: A | Discharge status: Home / Self Care | Payer: Self-pay | Source: Home / Self Care | Attending: General Surgery

## 2020-01-09 ENCOUNTER — Ambulatory Visit (HOSPITAL_COMMUNITY): Payer: Medicaid Other

## 2020-01-09 ENCOUNTER — Observation Stay (HOSPITAL_BASED_OUTPATIENT_CLINIC_OR_DEPARTMENT_OTHER)
Admission: RE | Admit: 2020-01-09 | Discharge: 2020-01-10 | Disposition: A | Discharge status: Home / Self Care | Payer: Medicaid Other | Attending: General Surgery | Admitting: General Surgery

## 2020-01-09 ENCOUNTER — Encounter (HOSPITAL_BASED_OUTPATIENT_CLINIC_OR_DEPARTMENT_OTHER): Payer: Self-pay | Admitting: General Surgery

## 2020-01-09 DIAGNOSIS — Z1501 Genetic susceptibility to malignant neoplasm of breast: Secondary | ICD-10-CM | POA: Insufficient documentation

## 2020-01-09 DIAGNOSIS — C50912 Malignant neoplasm of unspecified site of left female breast: Principal | ICD-10-CM | POA: Insufficient documentation

## 2020-01-09 DIAGNOSIS — N6489 Other specified disorders of breast: Secondary | ICD-10-CM | POA: Diagnosis not present

## 2020-01-09 DIAGNOSIS — Z9013 Acquired absence of bilateral breasts and nipples: Secondary | ICD-10-CM

## 2020-01-09 DIAGNOSIS — Z95828 Presence of other vascular implants and grafts: Secondary | ICD-10-CM

## 2020-01-09 DIAGNOSIS — C50412 Malignant neoplasm of upper-outer quadrant of left female breast: Secondary | ICD-10-CM | POA: Diagnosis present

## 2020-01-09 DIAGNOSIS — Z171 Estrogen receptor negative status [ER-]: Secondary | ICD-10-CM | POA: Diagnosis not present

## 2020-01-09 HISTORY — PX: PORTACATH PLACEMENT: SHX2246

## 2020-01-09 HISTORY — PX: MASTECTOMY W/ SENTINEL NODE BIOPSY: SHX2001

## 2020-01-09 SURGERY — MASTECTOMY WITH SENTINEL LYMPH NODE BIOPSY
Anesthesia: General | Site: Chest | Laterality: Right

## 2020-01-09 MED ORDER — DEXAMETHASONE SODIUM PHOSPHATE 10 MG/ML IJ SOLN
INTRAMUSCULAR | Status: AC
  Filled 2020-01-09: qty 1

## 2020-01-09 MED ORDER — ONDANSETRON HCL 4 MG/2ML IJ SOLN: 4.0000 mg | Freq: Four times a day (QID) | INTRAMUSCULAR | Status: DC | PRN

## 2020-01-09 MED ORDER — FENTANYL CITRATE (PF) 100 MCG/2ML IJ SOLN
INTRAMUSCULAR | Status: AC
  Filled 2020-01-09: qty 2

## 2020-01-09 MED ORDER — PROPOFOL 10 MG/ML IV BOLUS
INTRAVENOUS | Status: AC
  Filled 2020-01-09: qty 20

## 2020-01-09 MED ORDER — ROCURONIUM BROMIDE 10 MG/ML (PF) SYRINGE
PREFILLED_SYRINGE | INTRAVENOUS | Status: AC
  Filled 2020-01-09: qty 10

## 2020-01-09 MED ORDER — GABAPENTIN 300 MG PO CAPS
600.0000 mg | ORAL_CAPSULE | Freq: Every day | ORAL | Status: DC
  Administered 2020-01-09: 600 mg via ORAL
  Filled 2020-01-09: qty 2

## 2020-01-09 MED ORDER — ONDANSETRON HCL 4 MG/2ML IJ SOLN
INTRAMUSCULAR | Status: DC | PRN
  Administered 2020-01-09: 4 mg via INTRAVENOUS

## 2020-01-09 MED ORDER — SCOPOLAMINE 1 MG/3DAYS TD PT72
1.0000 | MEDICATED_PATCH | TRANSDERMAL | Status: DC
  Administered 2020-01-09: 1.5 mg via TRANSDERMAL

## 2020-01-09 MED ORDER — MIDAZOLAM HCL 2 MG/2ML IJ SOLN
INTRAMUSCULAR | Status: AC
  Filled 2020-01-09: qty 2

## 2020-01-09 MED ORDER — HEMOSTATIC AGENTS (NO CHARGE) OPTIME
TOPICAL | Status: DC | PRN
  Administered 2020-01-09: 3 via TOPICAL

## 2020-01-09 MED ORDER — BUPIVACAINE HCL (PF) 0.25 % IJ SOLN
INTRAMUSCULAR | Status: DC | PRN
  Administered 2020-01-09 (×2): 20 mL

## 2020-01-09 MED ORDER — ROCURONIUM BROMIDE 100 MG/10ML IV SOLN
INTRAVENOUS | Status: DC | PRN
  Administered 2020-01-09: 60 mg via INTRAVENOUS

## 2020-01-09 MED ORDER — KETOROLAC TROMETHAMINE 15 MG/ML IJ SOLN
15.0000 mg | Freq: Once | INTRAMUSCULAR | Status: AC
  Administered 2020-01-09: 15 mg via INTRAVENOUS

## 2020-01-09 MED ORDER — PHENYLEPHRINE 40 MCG/ML (10ML) SYRINGE FOR IV PUSH (FOR BLOOD PRESSURE SUPPORT)
PREFILLED_SYRINGE | INTRAVENOUS | Status: AC
  Filled 2020-01-09: qty 10

## 2020-01-09 MED ORDER — OXYCODONE HCL 5 MG PO TABS
5.0000 mg | ORAL_TABLET | ORAL | Status: DC | PRN
  Administered 2020-01-09: 5 mg via ORAL
  Administered 2020-01-10: 10 mg via ORAL
  Filled 2020-01-09: qty 2
  Filled 2020-01-09: qty 1

## 2020-01-09 MED ORDER — PROPOFOL 10 MG/ML IV BOLUS
INTRAVENOUS | Status: DC | PRN
  Administered 2020-01-09: 150 mg via INTRAVENOUS

## 2020-01-09 MED ORDER — KETOROLAC TROMETHAMINE 15 MG/ML IJ SOLN
15.0000 mg | INTRAMUSCULAR | Status: AC
  Administered 2020-01-09: 15 mg via INTRAVENOUS

## 2020-01-09 MED ORDER — HEPARIN SOD (PORK) LOCK FLUSH 100 UNIT/ML IV SOLN
INTRAVENOUS | Status: DC | PRN
  Administered 2020-01-09: 1000 [IU] via INTRAVENOUS

## 2020-01-09 MED ORDER — TRAMADOL HCL 50 MG PO TABS
50.0000 mg | ORAL_TABLET | Freq: Four times a day (QID) | ORAL | Status: DC | PRN
  Administered 2020-01-09: 50 mg via ORAL
  Filled 2020-01-09: qty 1

## 2020-01-09 MED ORDER — HYDROMORPHONE HCL 1 MG/ML IJ SOLN
0.2500 mg | INTRAMUSCULAR | Status: DC | PRN
  Administered 2020-01-09: 0.5 mg via INTRAVENOUS

## 2020-01-09 MED ORDER — CEFAZOLIN SODIUM-DEXTROSE 2-4 GM/100ML-% IV SOLN
INTRAVENOUS | Status: AC
  Filled 2020-01-09: qty 100

## 2020-01-09 MED ORDER — ONDANSETRON HCL 4 MG/2ML IJ SOLN: 4.0000 mg | Freq: Once | INTRAMUSCULAR | Status: DC | PRN

## 2020-01-09 MED ORDER — PHENYLEPHRINE HCL (PRESSORS) 10 MG/ML IV SOLN
INTRAVENOUS | Status: DC | PRN
  Administered 2020-01-09: 80 ug via INTRAVENOUS

## 2020-01-09 MED ORDER — EPHEDRINE SULFATE 50 MG/ML IJ SOLN
INTRAMUSCULAR | Status: DC | PRN
  Administered 2020-01-09: 5 mg via INTRAVENOUS
  Administered 2020-01-09 (×2): 10 mg via INTRAVENOUS

## 2020-01-09 MED ORDER — HEPARIN SOD (PORK) LOCK FLUSH 100 UNIT/ML IV SOLN
INTRAVENOUS | Status: AC
  Filled 2020-01-09: qty 5

## 2020-01-09 MED ORDER — HYDROMORPHONE HCL 1 MG/ML IJ SOLN
INTRAMUSCULAR | Status: AC
  Filled 2020-01-09: qty 0.5

## 2020-01-09 MED ORDER — ENSURE PRE-SURGERY PO LIQD: 296.0000 mL | Freq: Once | ORAL | Status: DC

## 2020-01-09 MED ORDER — CEFAZOLIN SODIUM-DEXTROSE 1-4 GM/50ML-% IV SOLN
INTRAVENOUS | Status: AC
  Filled 2020-01-09: qty 50

## 2020-01-09 MED ORDER — KETOROLAC TROMETHAMINE 15 MG/ML IJ SOLN
INTRAMUSCULAR | Status: AC
  Filled 2020-01-09: qty 1

## 2020-01-09 MED ORDER — METHOCARBAMOL 500 MG PO TABS
500.0000 mg | ORAL_TABLET | Freq: Four times a day (QID) | ORAL | Status: DC | PRN
  Administered 2020-01-09: 500 mg via ORAL
  Filled 2020-01-09: qty 1

## 2020-01-09 MED ORDER — ACETAMINOPHEN 500 MG PO TABS
1000.0000 mg | ORAL_TABLET | Freq: Four times a day (QID) | ORAL | Status: DC
  Administered 2020-01-09 – 2020-01-10 (×2): 1000 mg via ORAL
  Filled 2020-01-09 (×2): qty 2

## 2020-01-09 MED ORDER — LACTATED RINGERS IV SOLN: INTRAVENOUS | Status: DC

## 2020-01-09 MED ORDER — MIDAZOLAM HCL 2 MG/2ML IJ SOLN
2.0000 mg | Freq: Once | INTRAMUSCULAR | Status: AC
  Administered 2020-01-09: 2 mg via INTRAVENOUS

## 2020-01-09 MED ORDER — DEXMEDETOMIDINE (PRECEDEX) IN NS 20 MCG/5ML (4 MCG/ML) IV SYRINGE
PREFILLED_SYRINGE | INTRAVENOUS | Status: DC | PRN
  Administered 2020-01-09: 12 ug via INTRAVENOUS
  Administered 2020-01-09: 8 ug via INTRAVENOUS

## 2020-01-09 MED ORDER — SIMETHICONE 80 MG PO CHEW: 40.0000 mg | CHEWABLE_TABLET | Freq: Four times a day (QID) | ORAL | Status: DC | PRN

## 2020-01-09 MED ORDER — FENTANYL CITRATE (PF) 100 MCG/2ML IJ SOLN
INTRAMUSCULAR | Status: DC | PRN
  Administered 2020-01-09: 25 ug via INTRAVENOUS
  Administered 2020-01-09 (×2): 50 ug via INTRAVENOUS
  Administered 2020-01-09: 75 ug via INTRAVENOUS
  Administered 2020-01-09 (×2): 50 ug via INTRAVENOUS

## 2020-01-09 MED ORDER — DEXAMETHASONE SODIUM PHOSPHATE 10 MG/ML IJ SOLN
INTRAMUSCULAR | Status: DC | PRN
  Administered 2020-01-09: 10 mg via INTRAVENOUS

## 2020-01-09 MED ORDER — OXYCODONE HCL 5 MG/5ML PO SOLN: 5.0000 mg | Freq: Once | ORAL | Status: DC | PRN

## 2020-01-09 MED ORDER — HEPARIN (PORCINE) IN NACL 2-0.9 UNITS/ML
INTRAMUSCULAR | Status: AC | PRN
  Administered 2020-01-09: 1 via INTRAVENOUS

## 2020-01-09 MED ORDER — OXYCODONE HCL 5 MG PO TABS: 5.0000 mg | ORAL_TABLET | Freq: Once | ORAL | Status: DC | PRN

## 2020-01-09 MED ORDER — HEPARIN (PORCINE) IN NACL 1000-0.9 UT/500ML-% IV SOLN
INTRAVENOUS | Status: AC
  Filled 2020-01-09: qty 500

## 2020-01-09 MED ORDER — ONDANSETRON HCL 4 MG/2ML IJ SOLN
INTRAMUSCULAR | Status: AC
  Filled 2020-01-09: qty 2

## 2020-01-09 MED ORDER — BUPIVACAINE LIPOSOME 1.3 % IJ SUSP
INTRAMUSCULAR | Status: DC | PRN
  Administered 2020-01-09 (×2): 10 mL

## 2020-01-09 MED ORDER — SUGAMMADEX SODIUM 200 MG/2ML IV SOLN
INTRAVENOUS | Status: DC | PRN
  Administered 2020-01-09: 200 mg via INTRAVENOUS

## 2020-01-09 MED ORDER — MORPHINE SULFATE (PF) 4 MG/ML IV SOLN: 1.0000 mg | INTRAVENOUS | Status: DC | PRN

## 2020-01-09 MED ORDER — SCOPOLAMINE 1 MG/3DAYS TD PT72
MEDICATED_PATCH | TRANSDERMAL | Status: AC
  Filled 2020-01-09: qty 1

## 2020-01-09 MED ORDER — ACETAMINOPHEN 10 MG/ML IV SOLN
1000.0000 mg | Freq: Once | INTRAVENOUS | Status: DC | PRN
Start: 2020-01-09 — End: 2020-01-09

## 2020-01-09 MED ORDER — LIDOCAINE 2% (20 MG/ML) 5 ML SYRINGE
INTRAMUSCULAR | Status: AC
  Filled 2020-01-09: qty 5

## 2020-01-09 MED ORDER — CEFAZOLIN SODIUM-DEXTROSE 2-4 GM/100ML-% IV SOLN
2.0000 g | INTRAVENOUS | Status: AC
  Administered 2020-01-09: 3 g via INTRAVENOUS

## 2020-01-09 MED ORDER — ONDANSETRON 4 MG PO TBDP: 4.0000 mg | ORAL_TABLET | Freq: Four times a day (QID) | ORAL | Status: DC | PRN

## 2020-01-09 MED ORDER — FENTANYL CITRATE (PF) 100 MCG/2ML IJ SOLN
100.0000 ug | Freq: Once | INTRAMUSCULAR | Status: AC
  Administered 2020-01-09: 100 ug via INTRAVENOUS

## 2020-01-09 MED ORDER — CEFAZOLIN SODIUM-DEXTROSE 2-4 GM/100ML-% IV SOLN: 2.0000 g | INTRAVENOUS | Status: DC

## 2020-01-09 MED ORDER — LIDOCAINE 2% (20 MG/ML) 5 ML SYRINGE
INTRAMUSCULAR | Status: DC | PRN
  Administered 2020-01-09: 100 mg via INTRAVENOUS

## 2020-01-09 MED ORDER — AMISULPRIDE (ANTIEMETIC) 5 MG/2ML IV SOLN: 10.0000 mg | Freq: Once | INTRAVENOUS | Status: DC | PRN

## 2020-01-09 MED ORDER — SODIUM CHLORIDE 0.9 % IV SOLN: INTRAVENOUS | Status: DC

## 2020-01-09 MED ORDER — ACETAMINOPHEN 500 MG PO TABS
ORAL_TABLET | ORAL | Status: AC
  Filled 2020-01-09: qty 2

## 2020-01-09 MED ORDER — EPHEDRINE 5 MG/ML INJ
INTRAVENOUS | Status: AC
  Filled 2020-01-09: qty 10

## 2020-01-09 MED ORDER — SUGAMMADEX SODIUM 500 MG/5ML IV SOLN
INTRAVENOUS | Status: AC
  Filled 2020-01-09: qty 5

## 2020-01-09 MED ORDER — ACETAMINOPHEN 500 MG PO TABS
1000.0000 mg | ORAL_TABLET | ORAL | Status: AC
  Administered 2020-01-09: 1000 mg via ORAL

## 2020-01-09 MED ORDER — TECHNETIUM TC 99M TILMANOCEPT KIT
1.0000 | PACK | Freq: Once | INTRAVENOUS | Status: AC | PRN
  Administered 2020-01-09: 1 via INTRADERMAL

## 2020-01-09 MED FILL — GABAPENTIN 100 MG CAPSULE: 100 | 30 days supply | Qty: 60 | Fill #0

## 2020-01-09 SURGICAL SUPPLY — 72 items
ADH SKN CLS APL DERMABOND .7 (GAUZE/BANDAGES/DRESSINGS) ×8
APL PRP STRL LF DISP 70% ISPRP (MISCELLANEOUS) ×6
APPLIER CLIP 9.375 MED OPEN (MISCELLANEOUS) ×3
APR CLP MED 9.3 20 MLT OPN (MISCELLANEOUS) ×2
BAG DECANTER FOR FLEXI CONT (MISCELLANEOUS) ×3 IMPLANT
BINDER BREAST 3XL (GAUZE/BANDAGES/DRESSINGS) ×1 IMPLANT
BINDER BREAST XXLRG (GAUZE/BANDAGES/DRESSINGS) IMPLANT
BIOPATCH RED 1 DISK 7.0 (GAUZE/BANDAGES/DRESSINGS) ×2 IMPLANT
BLADE SURG 10 STRL SS (BLADE) ×4 IMPLANT
BLADE SURG 11 STRL SS (BLADE) ×3 IMPLANT
BLADE SURG 15 STRL LF DISP TIS (BLADE) ×2 IMPLANT
BLADE SURG 15 STRL SS (BLADE) ×3
CANISTER SUCT 1200ML W/VALVE (MISCELLANEOUS) ×3 IMPLANT
CHLORAPREP W/TINT 26 (MISCELLANEOUS) ×5 IMPLANT
CLIP APPLIE 9.375 MED OPEN (MISCELLANEOUS) IMPLANT
COVER BACK TABLE 60X90IN (DRAPES) ×3 IMPLANT
COVER MAYO STAND STRL (DRAPES) ×3 IMPLANT
COVER PROBE 5X48 (MISCELLANEOUS) ×3
COVER PROBE W GEL 5X96 (DRAPES) ×3 IMPLANT
DERMABOND ADVANCED (GAUZE/BANDAGES/DRESSINGS) ×4
DERMABOND ADVANCED .7 DNX12 (GAUZE/BANDAGES/DRESSINGS) ×2 IMPLANT
DRAIN CHANNEL 19F RND (DRAIN) ×4 IMPLANT
DRAPE C-ARM 42X72 X-RAY (DRAPES) ×3 IMPLANT
DRAPE LAPAROSCOPIC ABDOMINAL (DRAPES) ×3 IMPLANT
DRAPE TOP ARMCOVERS (MISCELLANEOUS) ×3 IMPLANT
DRAPE U-SHAPE 76X120 STRL (DRAPES) ×3 IMPLANT
DRAPE UTILITY XL STRL (DRAPES) ×5 IMPLANT
DRSG PAD ABDOMINAL 8X10 ST (GAUZE/BANDAGES/DRESSINGS) ×4 IMPLANT
DRSG TEGADERM 4X4.75 (GAUZE/BANDAGES/DRESSINGS) ×2 IMPLANT
ELECT BLADE 4.0 EZ CLEAN MEGAD (MISCELLANEOUS)
ELECT COATED BLADE 2.86 ST (ELECTRODE) ×3 IMPLANT
ELECT REM PT RETURN 9FT ADLT (ELECTROSURGICAL) ×3
ELECTRODE BLDE 4.0 EZ CLN MEGD (MISCELLANEOUS) IMPLANT
ELECTRODE REM PT RTRN 9FT ADLT (ELECTROSURGICAL) ×2 IMPLANT
EVACUATOR SILICONE 100CC (DRAIN) ×4 IMPLANT
GAUZE SPONGE 4X4 12PLY STRL LF (GAUZE/BANDAGES/DRESSINGS) ×3 IMPLANT
GLOVE BIO SURGEON STRL SZ7 (GLOVE) ×1 IMPLANT
GLOVE BIOGEL PI IND STRL 7.5 (GLOVE) ×2 IMPLANT
GLOVE BIOGEL PI INDICATOR 7.5 (GLOVE) ×2
GLOVE SURG ENC MOIS LTX SZ7 (GLOVE) ×3 IMPLANT
GOWN STRL REUS W/ TWL LRG LVL3 (GOWN DISPOSABLE) ×6 IMPLANT
GOWN STRL REUS W/TWL LRG LVL3 (GOWN DISPOSABLE) ×9
HEMOSTAT ARISTA ABSORB 3G PWDR (HEMOSTASIS) ×2 IMPLANT
HEMOSTAT SNOW SURGICEL 2X4 (HEMOSTASIS) ×1 IMPLANT
KIT CVR 48X5XPRB PLUP LF (MISCELLANEOUS) IMPLANT
KIT PORT POWER 8FR ISP CVUE (Port) ×1 IMPLANT
NS IRRIG 1000ML POUR BTL (IV SOLUTION) ×3 IMPLANT
PACK BASIN DAY SURGERY FS (CUSTOM PROCEDURE TRAY) ×3 IMPLANT
PENCIL SMOKE EVACUATOR (MISCELLANEOUS) ×3 IMPLANT
PIN SAFETY STERILE (MISCELLANEOUS) ×3 IMPLANT
SHEET MEDIUM DRAPE 40X70 STRL (DRAPES) ×1 IMPLANT
SLEEVE SCD COMPRESS KNEE MED (MISCELLANEOUS) ×3 IMPLANT
SLEEVE SURGEON STRL (DRAPES) ×1 IMPLANT
SPONGE LAP 18X18 RF (DISPOSABLE) ×7 IMPLANT
STAPLER VISISTAT 35W (STAPLE) ×1 IMPLANT
STRIP CLOSURE SKIN 1/2X4 (GAUZE/BANDAGES/DRESSINGS) ×5 IMPLANT
SUT ETHILON 2 0 FS 18 (SUTURE) ×4 IMPLANT
SUT ETHILON 3 0 PS 1 (SUTURE) ×2 IMPLANT
SUT MNCRL AB 4-0 PS2 18 (SUTURE) ×7 IMPLANT
SUT PROLENE 2 0 SH DA (SUTURE) ×4 IMPLANT
SUT SILK 2 0 SH (SUTURE) ×1 IMPLANT
SUT VIC AB 2-0 SH 18 (SUTURE) ×2 IMPLANT
SUT VIC AB 2-0 SH 27 (SUTURE) ×6
SUT VIC AB 2-0 SH 27XBRD (SUTURE) ×4 IMPLANT
SUT VIC AB 3-0 SH 27 (SUTURE) ×3
SUT VIC AB 3-0 SH 27X BRD (SUTURE) ×2 IMPLANT
SUT VICRYL 3-0 CR8 SH (SUTURE) ×6 IMPLANT
SYR 5ML LUER SLIP (SYRINGE) ×3 IMPLANT
SYR CONTROL 10ML LL (SYRINGE) ×3 IMPLANT
TOWEL GREEN STERILE FF (TOWEL DISPOSABLE) ×6 IMPLANT
TUBE CONNECTING 20X1/4 (TUBING) ×3 IMPLANT
YANKAUER SUCT BULB TIP NO VENT (SUCTIONS) ×3 IMPLANT

## 2020-01-09 NOTE — Anesthesia Procedure Notes (Signed)
Anesthesia Regional Block: Pectoralis block   Pre-Anesthetic Checklist: ,, timeout performed, Correct Patient, Correct Site, Correct Laterality, Correct Procedure, Correct Position, site marked, Risks and benefits discussed,  Surgical consent,  Pre-op evaluation,  At surgeon's request and post-op pain management  Laterality: Right and Upper  Prep: chloraprep       Needles:  Injection technique: Single-shot  Needle Type: Echogenic Needle     Needle Length: 9cm  Needle Gauge: 21     Additional Needles:   Procedures:,,,, ultrasound used (permanent image in chart),,,,  Narrative:  Start time: 01/09/2020 10:45 AM End time: 01/09/2020 10:53 AM Injection made incrementally with aspirations every 5 mL.  Performed by: Personally  Anesthesiologist: Trevor Iha, MD  Additional Notes: Block assessed. Patient tolerated procedure well.

## 2020-01-09 NOTE — H&P (Signed)
50 yof who in 2019 underwent a left lumpectomy and left TAD after primary chemotherapy. she had a great mr response. this tumor was a tnbc. she had a complete path response and negative nodes. at that point she had genetic testing with a brca 2 vus. 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 she saw Dr [name] at that time and he recommended MRI (she had normal mm in 9/21). in the left breast she was found to have a 7 mm nodule and a 2.4 cm area of nme. 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 The right breast has a 1.9 cm area of nme. Biopsy of this is a csl.  she has also undergone csc with Dr Ardis Hughs with a concerning polyp present- initial biopsy benign but due for repeat in January. she is here today to discuss options with her husband. Dr [name] said at conference will need port but note states we should wait.   Past Surgical History Rolm Bookbinder, MD; 12/14/2019 10:47 PM) Breast Biopsy  Left. Oral Surgery   Diagnostic Studies History Rolm Bookbinder, MD; 12/14/2019 10:47 PM) Colonoscopy  never Mammogram  within last year Pap Smear  1-5 years ago  Allergies (Tanisha A. Owens Shark, [place]; 12/14/2019 1:54 PM) No Known Allergies  [10/05/2016]: No Known Drug Allergies  [01/07/2017]: Allergies Reconciled   Medication History (Tanisha A. Owens Shark, [place]; 12/14/2019 1:54 PM) Gabapentin (300MG Capsule, Oral) Active. Gabapentin (100MG Capsule, Oral) Active. Medications Reconciled  Social History Rolm Bookbinder, MD; 12/14/2019 10:47 PM) Alcohol use  Occasional alcohol use. No caffeine use  No drug use  Tobacco use  Never smoker.  Family History Rolm Bookbinder, MD; 12/14/2019 10:47 PM) Arthritis  Family Members In General, Mother. Breast Cancer  Family Members In [place]  Father. Diabetes Mellitus  Family Members In  General, Father, Mother. Heart Disease  Father. Heart disease in female family member before age 62  Hypertension  Brother, Family Members In [place], Father, Mother. Ovarian Cancer  Family Members In General. Respiratory Condition  Father.  Vitals (Tanisha A. Brown RMA; 12/14/2019 1:54 PM) 12/14/2019 1:54 PM Weight: 268.4 lb Height: 67.5in Body Surface Area: 2.3 m Body Mass Index: 41.42 kg/m  Temp.: 98.13F  Pulse: 111 (Regular)  BP: 134/82(Sitting, Left Arm, Standard)  Physical Exam Rolm Bookbinder MD; 12/14/2019 10:38 PM) General Mental Status-Alert. Orientation-Oriented X3. Breast Nipples-No Discharge. Breast Lump-No Palpable Breast Mass. Lymphatic Head & Neck General Head & Neck Lymphatics: Bilateral - Description - Normal. Axillary General Axillary Region: Bilateral - Description - Normal. 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 we discussed reconstruction. she is not sure and I will have her see plastic surgery asap. we discussed nsm, ssm and total mastectomy without reconstruction. risks and recovery both discussed. I will have to ask Dr [name] about port and if going to take some time may start tamoxifen. I will talk to her as soon as she sees plastic surgery. 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

## 2020-01-09 NOTE — Transfer of Care (Signed)
Immediate Anesthesia Transfer of Care Note  Patient: [name]  Procedure(s) Performed: BILATERAL MASTECTOMY WITH LEFT AXILLARY SENTINEL LYMPH NODE BIOPSY (Bilateral Breast) INSERTION PORT-A-CATH (Right Chest)  Patient Location: PACU  Anesthesia Type:GA combined with regional for post-op pain  Level of Consciousness: sedated  Airway & Oxygen Therapy: Patient Spontanous Breathing and Patient connected to face mask oxygen  Post-op Assessment: Report given to RN and Post -op Vital signs reviewed and stable  Post vital signs: Reviewed and stable  Last Vitals:  Vitals Value Taken Time  BP 130/80 01/09/20 1535  Temp    Pulse 107 01/09/20 1537  Resp 18 01/09/20 1537  SpO2 100 % 01/09/20 1537  Vitals shown include unvalidated device data.  Last Pain:  Vitals:   01/09/20 1002  TempSrc: Oral  PainSc: 0-No pain      Patients Stated Pain Goal: 4 (01/09/20 1002)  Complications: No complications documented.

## 2020-01-09 NOTE — Anesthesia Postprocedure Evaluation (Signed)
Anesthesia Post Note  Patient: Anelle Parlow Holmes  Procedure(s) Performed: BILATERAL MASTECTOMY WITH LEFT AXILLARY SENTINEL LYMPH NODE BIOPSY (Bilateral Breast) INSERTION PORT-A-CATH (Right Chest)     Patient location during evaluation: PACU Anesthesia Type: General Level of consciousness: awake and alert Pain management: pain level controlled Vital Signs Assessment: post-procedure vital signs reviewed and stable Respiratory status: spontaneous breathing, nonlabored ventilation, respiratory function stable and patient connected to nasal cannula oxygen Cardiovascular status: blood pressure returned to baseline and stable Postop Assessment: no apparent nausea or vomiting Anesthetic complications: no   No complications documented.  Last Vitals:  Vitals:   01/09/20 1600 01/09/20 1615  BP: 111/84 102/76  Pulse: (!) 112 (!) 104  Resp: 11 13  Temp:    SpO2: 92% 94%    Last Pain:  Vitals:   01/09/20 1615  TempSrc:   PainSc: 4                  Trevor Iha

## 2020-01-09 NOTE — Op Note (Signed)
Preoperative diagnosis: left breast cancer, brca 2 positive Postoperative diagnosis: saa Procedure: 1. Right IJ port placement 2. Right risk reducing mastectomy 3. Left mastectomy Surgeon: Dr Serita Grammes Anesthesia: general with bilateral pec blocks EBL: 100 cc Drains 19 Fr Blake drain to either side Specimens:  1right breast marked short superior, long lateral double deep 2. Left breast marked short superior, long lateral double deep Complications none Sponge and needle count correct dispo recovery stable.  Indications: 29 yof who in 2019 underwent a left lumpectomy and left TAD after primary chemotherapy. she had a great mr response. this tumor was a tnbc. she had a complete path response and negative nodes. at that point she had genetic testing with a brca 2 vus. 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 she saw Dr [name] at that time and he recommended MRI (she had normal mm in 9/21). in the left breast she was found to have a 7 mm nodule and a 2.4 cm area of nme. 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 The right breast has a 1.9 cm area of nme. Biopsy of this is a csl.  she has also undergone csc with Dr Ardis Hughs with a concerning polyp present- initial biopsy benign but due for repeat in January. we elected to proceed with bilateral mastectomy, attempt at sn biopsy, port placement.  Procedure: After informed consent obtained patient underwent bilateral pec blocks. Ancef 3 gms given. SCDs were in place. She was injected with lymposeek in standard fashion.  She was placed under general anesthesia without complication.  She was prepped and draped in standard sterile surgical fashion. A surgical timeout was performed.  I first did the port.  She was appropriately padded.  I then identified the right IJ with the ultrasound I made a skin nick and  accessed the vein under direct vision. I then placed the wire. Fluoroscopy confirmed this in the correct position.  I confirmed wire was in the vein by ultrasound.  I then made an incision below the clavicle and created a pocket. I tunneled the line between the two sites. I then placed the dilator assembly over the wire under fluoroscopy. This was in good position. I removed the wire.  I then placed the line through the sheath and removed it.  I pulled this back and then redid it as it was short.  I then hooked the port up and this aspirated blood and flushed easily. I packed this with heparin.  I then closed with 3-0 vicryl and 4-0 monocryl. I placed glue and steristrips.    I then did the right mastectomy. I made a reduction pattern incision and then created flaps to the parasternal region, clavicle, im fold and latissimus laterally. I then removed the breast and the pectoralis fascia.  I then marked this as above.  I obtained hemostasis. I closed the lateral tissue down to the chest wall.  I then placed a 19 Fr Blake drain and secured this with a 2-0 nylon.  I then closed he dermis with 3-0 vicryl and the skin with 4-0 monocryl.  I put a 3-0 nylon to approximate the T portion.  She will have a large shelf all around due to habitus. I then placed glue and steristrips.  I performed the left mastectomy the same way without differences. This was harder due to prior  radiotherapy.  I then placed a 19 Fr Blake drain. I attempted to locate a sentinel node but there was no tracer and certainly nothing palpable in the axilla. I did not proceed with alnd as there is no real survival benefit and will not change her therapy plus nodes were negative preop.  I closed this the same way as the other side.   She tolerated this well, was extubated and transferred to recovery stable.

## 2020-01-09 NOTE — Interval H&P Note (Signed)
History and Physical Interval Note:  01/09/2020 11:16 AM  [name]  has presented today for surgery, with the diagnosis of BRCA2 POSITIVE, BREAST CANCER.  The various methods of treatment have been discussed with the patient and family. After consideration of risks, benefits and other options for treatment, the patient has consented to  Procedure(s) with comments: BILATERAL MASTECTOMY WITH LEFT AXILLARY SENTINEL LYMPH NODE BIOPSY (Bilateral) - BILATERAL PEC BLOCK, RNFA INSERTION PORT-A-CATH (Right) as a surgical intervention.  The patient's history has been reviewed, patient examined, no change in status, stable for surgery.  I have reviewed the patient's chart and labs.  Questions were answered to the patient's satisfaction.     Rolm Bookbinder

## 2020-01-09 NOTE — Brief Op Note (Signed)
01/09/2020  3:28 PM  PATIENT:  [name]  50 y.o. female  PRE-OPERATIVE DIAGNOSIS:  BRCA2 POSITIVE, BREAST CANCER  POST-OPERATIVE DIAGNOSIS:  BRCA2 POSITIVE, BREAST CANCER  PROCEDURE:  Procedure(s): BILATERAL MASTECTOMY WITH LEFT AXILLARY SENTINEL LYMPH NODE BIOPSY (Bilateral) INSERTION PORT-A-CATH (Right)  SURGEON:  Surgeon(s) and Role:    Rolm Bookbinder, MD - Primary  PHYSICIAN ASSISTANT:   ASSISTANTS: OR staff   ANESTHESIA:   General with bilateral pec blocks  EBL:  200 mL   BLOOD ADMINISTERED:none  DRAINS: 19 Fr Blake drain  LOCAL MEDICATIONS USED:  NONE  SPECIMEN:  Simple Mastectomy  DISPOSITION OF SPECIMEN:  PATHOLOGY  COUNTS:  YES  TOURNIQUET:  * No tourniquets in log *  DICTATION: .Dragon Dictation  PLAN OF CARE: Admit for overnight observation  PATIENT DISPOSITION:  PACU - hemodynamically stable.   Delay start of Pharmacological VTE agent (>24hrs) due to surgical blood loss or risk of bleeding: not applicable

## 2020-01-09 NOTE — Anesthesia Procedure Notes (Signed)
Procedure Name: Intubation Date/Time: 01/09/2020 12:15 PM Performed by: Lavonia Dana, CRNA Pre-anesthesia Checklist: Patient identified, Emergency Drugs available, Suction available and Patient being monitored Patient Re-evaluated:Patient Re-evaluated prior to induction Oxygen Delivery Method: Circle system utilized Preoxygenation: Pre-oxygenation with 100% oxygen Induction Type: IV induction Ventilation: Mask ventilation without difficulty Laryngoscope Size: Mac and 3 Grade View: Grade I Tube type: Oral Tube size: 7.0 mm Number of attempts: 1 Airway Equipment and Method: Stylet and Bite block Placement Confirmation: ETT inserted through vocal cords under direct vision,  positive ETCO2 and breath sounds checked- equal and bilateral Secured at: 22 cm Tube secured with: Tape Dental Injury: Teeth and Oropharynx as per pre-operative assessment

## 2020-01-09 NOTE — Progress Notes (Signed)
Nuc med at bedside, time-out complete, injections given left side.  VSS, pt tolerate well

## 2020-01-09 NOTE — Progress Notes (Signed)
Assisted Dr. Valma Cava with right, left, ultrasound guided, pectoralis block. Side rails up, monitors on throughout procedure. See vital signs in flow sheet. Tolerated Procedure well.

## 2020-01-09 NOTE — Anesthesia Procedure Notes (Signed)
Anesthesia Regional Block: Pectoralis block   Pre-Anesthetic Checklist: ,, timeout performed, Correct Patient, Correct Site, Correct Laterality, Correct Procedure, Correct Position, site marked, Risks and benefits discussed,  Surgical consent,  Pre-op evaluation,  At surgeon's request and post-op pain management  Laterality: Left and Upper  Prep: chloraprep       Needles:  Injection technique: Single-shot  Needle Type: Echogenic Needle     Needle Length: 9cm  Needle Gauge: 21     Additional Needles:   Procedures:,,,, ultrasound used (permanent image in chart),,,,  Narrative:  Start time: 01/09/2020 10:38 AM End time: 01/09/2020 10:45 AM Injection made incrementally with aspirations every 5 mL.  Performed by: Personally  Anesthesiologist: Trevor Iha, MD  Additional Notes: Block assessed. Patient tolerated procedure well.

## 2020-01-10 ENCOUNTER — Other Ambulatory Visit (HOSPITAL_BASED_OUTPATIENT_CLINIC_OR_DEPARTMENT_OTHER): Payer: Self-pay | Admitting: General Surgery

## 2020-01-10 DIAGNOSIS — C50912 Malignant neoplasm of unspecified site of left female breast: Secondary | ICD-10-CM | POA: Diagnosis not present

## 2020-01-10 MED ORDER — METHOCARBAMOL 500 MG PO TABS: 500.0000 mg | ORAL_TABLET | Freq: Four times a day (QID) | ORAL | 1 refills | Status: DC | PRN

## 2020-01-10 MED ORDER — OXYCODONE HCL 5 MG PO TABS: 5.0000 mg | ORAL_TABLET | ORAL | 0 refills | Status: DC | PRN

## 2020-01-10 MED FILL — oxyCODONE HCL 5 MG TABS: 5 | 2 days supply | Qty: 15 | Fill #0

## 2020-01-10 MED FILL — METHOCARBAMOL 500 MG TABLET: 500 | 7 days supply | Qty: 30 | Fill #0

## 2020-01-10 NOTE — Discharge Instructions (Signed)
[facility], [place] [phone]  MASTECTOMY: POST OP INSTRUCTIONS Take 400 mg of ibuprofen every 8 hours or 650 mg tylenol every 6 hours for next 72 hours then as needed. Use ice several times daily also. Always review your discharge instruction sheet given to you by the facility where your surgery was performed.  Next dose of Oxycodone can be given at 9:30am if needed.  IF YOU HAVE DISABILITY OR FAMILY LEAVE FORMS, YOU MUST BRING THEM TO THE OFFICE FOR PROCESSING.   DO NOT GIVE THEM TO YOUR DOCTOR. A prescription for pain medication may be given to you upon discharge.  Take your pain medication as prescribed, if needed.  If narcotic pain medicine is not needed, then you may take acetaminophen (Tylenol), naprosyn (Alleve) or ibuprofen (Advil) as needed. 1. Take your usually prescribed medications unless otherwise directed. 2. If you need a refill on your pain medication, please contact your pharmacy.  They will contact our office to request authorization.  Prescriptions will not be filled after 5pm or on week-ends. 3. You should follow a light diet the first few days after arrival home, such as soup and crackers, etc.  Resume your normal diet the day after surgery. 4. Most patients will experience some swelling and bruising on the chest and underarm.  Ice packs will help.  Swelling and bruising can take several days to resolve. Wear the binder day and night until you return to the office.  5. It is common to experience some constipation if taking pain medication after surgery.  Increasing fluid intake and taking a stool softener (such as Colace) will usually help or prevent this problem from occurring.  A mild laxative (Milk of Magnesia or Miralax) should be taken according to package instructions if there are no bowel movements after 48 hours. 6. Unless discharge instructions indicate otherwise, leave your bandage dry and in place until your next appointment in 3-5 days.  You may  take a limited sponge bath.  No tube baths or showers until the drains are removed.  You may have steri-strips (small skin tapes) in place directly over the incision.  These strips should be left on the skin for 7-10 days. If you have glue it will come off in next couple week.  Any sutures will be removed at an office visit 7. DRAINS:  If you have drains in place, it is important to keep a list of the amount of drainage produced each day in your drains.  Before leaving the hospital, you should be instructed on drain care.  Call our office if you have any questions about your drains. I will remove your drains when they put out less than 30 cc or ml for 2 consecutive days. 8. ACTIVITIES:  You may resume regular (light) daily activities beginning the next day--such as daily self-care, walking, climbing stairs--gradually increasing activities as tolerated.  You may have sexual intercourse when it is comfortable.  Refrain from any heavy lifting or straining until approved by your doctor. a. You may drive when you are no longer taking prescription pain medication, you can comfortably wear a seatbelt, and you can safely maneuver your car and apply brakes. b. RETURN TO WORK:  __________________________________________________________ 9. You should see your doctor in the office for a follow-up appointment approximately 3-5 days after your surgery.  Your doctors nurse will typically make your follow-up appointment when she calls you with your pathology report.  Expect your pathology report 3-4business days after surgery. 10. OTHER INSTRUCTIONS:  ______________________________________________________________________________________________ ____________________________________________________________________________________________ WHEN TO CALL YOUR DR WAKEFIELD: 1. Fever over 101.0 2. Nausea and/or vomiting 3. Extreme swelling or bruising 4. Continued bleeding from incision. 5. Increased pain, redness, or drainage  from the incision. The clinic staff is available to answer your questions during regular business hours.  Please dont hesitate to call and ask to speak to one of the nurses for clinical concerns.  If you have a medical emergency, go to the nearest emergency room or call 911.  A surgeon from [facility] is always on call at the hospital. [address], [address], [place], [place]  [address] ? [address] [address], [place], [place]   [address] [phone] ? [phone] ? FAX [phone] Web site: [email or web address]    About my Jackson-Pratt Bulb Drain  What is a Jackson-Pratt bulb? A Jackson-Pratt is a soft, round device used to collect drainage. It is connected to a long, thin drainage catheter, which is held in place by one or two small stiches near your surgical incision site. When the bulb is squeezed, it forms a vacuum, forcing the drainage to empty into the bulb.  Emptying the Jackson-Pratt bulb- To empty the bulb: 1. Release the plug on the top of the bulb. 2. Pour the bulb's contents into a measuring container which your nurse will provide. 3. Record the time emptied and amount of drainage. Empty the drain(s) as often as your     doctor or nurse recommends.  Date                  Time                    Amount (Drain 1)                 Amount (Drain 2)  _____________________________________________________________________  _____________________________________________________________________  _____________________________________________________________________  _____________________________________________________________________  _____________________________________________________________________  _____________________________________________________________________  _____________________________________________________________________  _____________________________________________________________________  Squeezing the Jackson-Pratt  Bulb- To squeeze the bulb: 1. Make sure the plug at the top of the bulb is open. 2. Squeeze the bulb tightly in your fist. You will hear air squeezing from the bulb. 3. Replace the plug while the bulb is squeezed. 4. Use a safety pin to attach the bulb to your clothing. This will keep the catheter from     pulling at the bulb insertion site.  When to call your doctor- Call your doctor if:  Drain site becomes red, swollen or hot.  You have a fever greater than 101 degrees F.  There is oozing at the drain site.  Drain falls out (apply a guaze bandage over the drain hole and secure it with tape).  Drainage increases daily not related to activity patterns. (You will usually have more drainage when you are active than when you are resting.)  Drainage has a bad odor.  Information for Discharge Teaching: EXPAREL (bupivacaine liposome injectable suspension)   Your surgeon or anesthesiologist gave you EXPAREL(bupivacaine) to help control your pain after surgery.   EXPAREL is a local anesthetic that provides pain relief by numbing the tissue around the surgical site.  EXPAREL is designed to release pain medication over time and can control pain for up to 72 hours.  Depending on how you respond to EXPAREL, you may require less pain medication during your recovery.  Possible side effects:  Temporary loss of sensation or ability to move in the area where bupivacaine was injected.  Nausea, vomiting, constipation  Rarely, numbness and tingling in your mouth  or lips, lightheadedness, or anxiety may occur.  Call your doctor right away if you think you may be experiencing any of these sensations, or if you have other questions regarding possible side effects.  Follow all other discharge instructions given to you by your surgeon or nurse. Eat a healthy diet and drink plenty of water or other fluids.  If you return to the hospital for any reason within 96 hours following the administration  of EXPAREL, it is important for health care providers to know that you have received this anesthetic. A teal colored band has been placed on your arm with the date, time and amount of EXPAREL you have received in order to alert and inform your health care providers. Please leave this armband in place for the full 96 hours following administration, and then you may remove the band.

## 2020-01-10 NOTE — Discharge Summary (Signed)
Physician Discharge Summary  Patient ID: [name] MRN: [id number] DOB/AGE: 1970-02-16 50 y.o.  Admit date: 01/09/2020 Discharge date: 01/10/2020  Admission Diagnoses: BRCA positive Left breast cancer  Discharge Diagnoses:  Active Problems:   S/P bilateral mastectomy   Discharged Condition: good  Hospital Course: 30 yof s/p bilateral mastectomies and right ij port placement. Doing well following am with expected drain output. Will dc home  Consults: None  Significant Diagnostic Studies: none  Treatments: surgery: bilateral mastectomies, port placement   Discharge Exam: Blood pressure 123/63, pulse 84, temperature 97.7 F (36.5 C), resp. rate 16, height 5' 7.5" (1.715 m), weight 122.2 kg, SpO2 97 %. Bilateral incisions clean, no hematoma, drains as expected  Disposition: Discharge disposition: 01-Home or Self Care        Allergies as of 01/10/2020      Reactions   Carboplatin Other (See Comments)   Red, blotchy, hot      Medication List    TAKE these medications   gabapentin 300 MG capsule Commonly known as: NEURONTIN TAKE 2 CAPSULES (600 MG TOTAL) BY MOUTH AT BEDTIME.   gabapentin 100 MG capsule Commonly known as: NEURONTIN TAKE 2 CAPSULES BY MOUTH ONCE A DAY   ibuprofen 800 MG tablet Commonly known as: ADVIL Take 1 tablet (800 mg total) by mouth every 8 (eight) hours as needed for moderate pain. For AFTER surgery only   LORazepam 0.5 MG tablet Commonly known as: ATIVAN Patient to take 1 tablet, 0.59m, 1 hour prior to MRI.   May repeat if necessary.   methocarbamol 500 MG tablet Commonly known as: ROBAXIN Take 1 tablet (500 mg total) by mouth every 6 (six) hours as needed for muscle spasms.   oxyCODONE 5 MG immediate release tablet Commonly known as: Oxy IR/ROXICODONE Take 1 tablet (5 mg total) by mouth every 4 (four) hours as needed for moderate pain.   senna-docusate 8.6-50 MG tablet Commonly known as: Senokot-S Take 2 tablets by mouth  at bedtime. For AFTER surgery, do not take if having diarrhea   traMADol 50 MG tablet Commonly known as: ULTRAM Take 1 tablet (50 mg total) by mouth every 6 (six) hours as needed for severe pain. For AFTER surgery, do not take and drive       Follow-up Information    WRolm Bookbinder MD Follow up in 2 week(s).   Specialty: General Surgery Contact information: [phone]               Signed: MRolm Bookbinder1/05/2020, 8:08 AM

## 2020-01-11 ENCOUNTER — Encounter (HOSPITAL_BASED_OUTPATIENT_CLINIC_OR_DEPARTMENT_OTHER): Payer: Self-pay | Admitting: General Surgery

## 2020-01-18 ENCOUNTER — Other Ambulatory Visit (HOSPITAL_COMMUNITY): Payer: Self-pay | Admitting: General Surgery

## 2020-01-18 ENCOUNTER — Encounter: Payer: Self-pay | Admitting: *Deleted

## 2020-01-18 MED FILL — IBUPROFEN 800 MG TAB: 800 | 13 days supply | Qty: 40 | Fill #0

## 2020-01-19 LAB — SURGICAL PATHOLOGY

## 2020-01-23 ENCOUNTER — Encounter (HOSPITAL_COMMUNITY): Payer: Self-pay | Admitting: Gastroenterology

## 2020-01-24 ENCOUNTER — Other Ambulatory Visit (HOSPITAL_COMMUNITY): Payer: Self-pay | Admitting: General Surgery

## 2020-01-24 MED FILL — METHOCARBAMOL 500 MG TABLET: 500 | 20 days supply | Qty: 60 | Fill #0

## 2020-01-24 NOTE — Progress Notes (Signed)
[name]  [phone] [phone]     ID: [name] DOB: 05/12/1953  MR#: [id number]  KGY#:[id number]  Patient Care Team: [name], MD as PCP - General (Internal Medicine) Vivian Neuwirth, [name], MD as Consulting Physician (Oncology) [name], MD as Attending Physician (Radiation Oncology) Delice Bison Charlestine Massed, NP as Nurse Practitioner (Hematology and Oncology) Rolm Bookbinder, MD as Consulting Physician (General Surgery) Claudia Desanctis Steffanie Dunn, MD as Consulting Physician (Plastic Surgery) Mauro Kaufmann, RN as Oncology Nurse Navigator Rockwell Germany, RN as Oncology Nurse Navigator Milus Banister, MD as Attending Physician (Gastroenterology) OTHER MD:  I connected with Bary Leriche on 01/[birth year]/49 at  9:15 AM EST by video enabled telemedicine visit and verified that I am speaking with the correct person using two identifiers.   I discussed the limitations, risks, security and privacy concerns of performing an evaluation and management service by telemedicine and the availability of in-person appointments. I also discussed with the patient that there may be a patient responsible charge related to this service. The patient expressed understanding and agreed to proceed.   Other persons participating in the visit and their role in the encounter: husband [name]  Patient's location: home Provider's location: [place]    CHIEF COMPLAINT: Triple negative breast cancer; BRCA2 positive  CURRENT TREATMENT: to start adjuvant chemotherapy   INTERVAL HISTORY: [name] was contacted today for a follow-up of her triple negative breast cancer accompanied by her husband [name].   Since her last visit, she underwent bilateral mastectomies on 01/09/2020 under [name]. Pathology from the procedure ([id number] ) showed: A. RIGHT BREAST  - complex sclerosing lesion  - fibrocystic changes with usual ductal  hyperplasia and calcifications B. LEFT BREAST  - invasive ductal carcinoma, 2.1 cm, grade 3  - margins not involved  She had a port placed at the same time.  She is scheduled for colonoscopy on 02/01/2020 under Dr. Ardis Hughs.  Her bilateral salpingo-oophorectomy and hysterectomy has been postponed until after the above procedures have been completed.   REVIEW OF SYSTEMS: Burnette tells me she has an "incredible peace" of mind following her surgery.  This was definitely the right decision for her.  On the other hand she still has significant pain.  She says the pain can make her cry it is so sharp and difficult.  She was prescribed gabapentin and she takes 100 mg twice daily and then 3 to 600 mg at night.  She says the 100 mg tablets do not make her too sleepy but they do not allow her to function very well for example at work.  200 mg would make her sleepy.  She is taking 800 mg of Motrin irregularly and 1000 mg of Tylenol also irregularly.  She was prescribed Robaxin by [name] and that is helping a little but not enough she says the pain is the main issue right now.  She is just beginning to get used to the way the incisions look.  Aside from these issues a detailed review of systems today was stable   COVID 19 VACCINATION STATUS:    BREAST CANCER HISTORY: From the original intake note:  The patient was seen in the emergency room 05/16/2016 with nonspecific complaints but also noting that she had a tender lump in her lateral left breast which she said she had noted the day before. Exam by the emergency room physician confirmed a 3 cm soft left lateral breast mass without overlying erythema  or nipple changes. This was felt to be most consistent with fibrocystic change but the patient was referred back to her primary care physician for further evaluation. She saw the physician assistant in May 18 and then Dr. Wynetta Emery on 06/12/2016 who scheduled a bilateral diagnostic mammography with tomography  and left breast ultrasonography at the [facility] 06/18/2016. This found the breast density to be category B. In the left breast upper outer quadrant there was an area of asymmetry measuring up to 10.3 cm. On exam this was firm and palpable and on ultrasound there was an irregular mass measuring 2.5 cm with some subtle changes in the surrounding tissue. In the left lower axilla there were 2 suspicious-looking lymph nodes.  Biopsy of the left breast upper outer quadrant and one of the suspicious lymph nodes 06/19/2016 showed ([id number]) both to be involved by invasive ductal carcinoma, grade 3, estrogen and progesterone receptor negative, HER-2 not amplified with a signals ratio being 1.53-1.74 and the number per cell 2.35-3.05. The MIB-1 was 90%.  The patient's subsequent history is as detailed below.   PAST MEDICAL HISTORY: Past Medical History:  Diagnosis Date  . Arthritis   . Breast cancer ([place])    Left Breast Cancer  . Breast cancer ([place]) 11/24/2019   left   . Complication of anesthesia    DAY SURGERY 2010 OR 2011 ASPIRATED AND STAYED OVERNIGHT  . History of radiation therapy 03/11/17- 04/21/17   Left breast, 2 Gy in 25 fractions for a total dose of 50 Gy, Boost, 2 Gy in 5 fractions for a total dose of 10 Gy  . Hypertension    OFF MEDS  SINCE CHEMO X 5 MONTHS  . Malignant neoplasm of upper-outer quadrant of left breast in female, estrogen receptor negative ([place]) 06/23/2016  . Personal history of chemotherapy 2019   Left Breast Cancer  07-2016-12-2016  . Personal history of radiation therapy 2019   Left Breast Cancer    PAST SURGICAL HISTORY: Past Surgical History:  Procedure Laterality Date  . ANKLE SURGERY    . BREAST LUMPECTOMY Left 01/27/2017  . BREAST LUMPECTOMY WITH RADIOACTIVE SEED AND SENTINEL LYMPH NODE BIOPSY Left 01/27/2017   Procedure: LEFT BREAST LUMPECTOMY WITH RADIOACTIVE SEED LOCALIZATION, LEFT AXILLARY DEEP LYMPH NODE BIOPSY WITH RADIOACTIVE SEED LOCALIZATION,  LEFT AXILLARY SENTINEL LYMPH NODE MAPPING AND BIOPSY WITH BLUE DYE INJECTION;  Surgeon: [name], MD;  Location: [facility];  Service: General;  Laterality: Left;  . IR FLUORO GUIDE PORT INSERTION RIGHT  07/14/2016  . IR US GUIDE VASC ACCESS RIGHT  07/14/2016  . MASTECTOMY W/ SENTINEL NODE BIOPSY Bilateral 01/09/2020   Procedure: BILATERAL MASTECTOMY WITH LEFT AXILLARY SENTINEL LYMPH NODE BIOPSY;  Surgeon: Rolm Bookbinder, MD;  Location: [facility];  Service: General;  Laterality: Bilateral;  . PORT-A-CATH REMOVAL Right 01/27/2017   Procedure: REMOVAL PORT-A-CATH;  Surgeon: [name], MD;  Location: [facility];  Service: General;  Laterality: Right;  . PORTACATH PLACEMENT Right 01/09/2020   Procedure: INSERTION PORT-A-CATH;  Surgeon: Rolm Bookbinder, MD;  Location: [facility];  Service: General;  Laterality: Right;  . TUBAL LIGATION    . WISDOM TOOTH EXTRACTION      FAMILY HISTORY Family History  Problem Relation Age of Onset  . Diabetes Mother   . Hypertension Mother   . Arthritis Mother   . Colon cancer Father 58       died .76 metastatic at time of diagnosis  . Diabetes Father   .  Heart disease Father   . Hypertension Father   . Breast cancer Maternal Grandmother 29       d.60s  . Breast cancer Maternal Aunt 52  . Cervical cancer Maternal Aunt 46  . Breast cancer Maternal Aunt 46       d.50s  . Cancer Maternal Uncle        d.62s unspecified type of cancer  . Breast cancer Cousin 46       paternal first-cousin (daughter of unaffected aunt)  . Cancer Maternal Aunt 73       "Female Cancer"  . Cancer Maternal Aunt        unknown cancer  . Brain cancer Daughter 58  . Esophageal cancer Other   . Colon polyps Neg Hx   . Rectal cancer Neg Hx   . Stomach cancer Neg Hx   The patient's father died at age 39 from colon cancer which she never had treated. The patient's mother is living, 44 years old as of June 2018. The patient has one brother, one  sister. On the mother's side there is a history of breast cancer in the grandmother, age 45, and then 1 aunt with breast cancer age 87, and another with lung cancer age 35. There is no history of ovarian cancer in the family.   GYNECOLOGIC HISTORY:  No LMP recorded (lmp unknown). Patient is postmenopausal. Menarche age 51, first live birth age 52. She is GX P4. Her periods have never been regular. She had a period in June 2018 but not for 4 months before that. She took birth control for some years remotely with no complications. She is status post bilateral tubal ligation.   SOCIAL HISTORY:  The patient's husband owns and runs a food truck.. They serve mostly [place] and [place] food. 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 Her husband Keoshia Steinmetz (goes by "[name]") is originally from [name]. The patient has two children from her first marriage. Her son [name], who is 47 years old as of Oct 2021, was previously in the [employer], but now is taking courses at [employer] and  her 59 y/o daughter [name], who was studying psychology at [name] before her optic pathway germinoma, which has left her 97% blind.  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 scholarship, doing premed) and 51 as of October 2020    ADVANCED DIRECTIVES: In the absence of any documents to the contrary the patient's husband is her healthcare power of attorney   HEALTH MAINTENANCE: Social History   Tobacco Use  . Smoking status: Never Smoker  . Smokeless tobacco: Never Used  Vaping Use  . Vaping Use: Never used  Substance Use Topics  . Alcohol use: No  . Drug use: No     Colonoscopy: scheduled for 02/01/2020  VFI:EPPI 2018  Bone density:Never   Allergies  Allergen Reactions  . Carboplatin Other (See Comments)     Red, blotchy, hot    Current Outpatient Medications  Medication Sig Dispense Refill  . traMADol (ULTRAM) 50 MG tablet Take 1-2 tablets (50-100 mg total) by mouth every 6 (six) hours as needed. (Patient taking differently: Take 50-100 mg by mouth every 6 (six) hours as needed for moderate pain.) 60 tablet 0  . acetaminophen (TYLENOL) 325 MG tablet Take 650 mg by mouth 3 (three)  times daily.    Marland Kitchen gabapentin (NEURONTIN) 100 MG capsule TAKE 2 CAPSULES BY MOUTH ONCE A DAY (Patient taking differently: Take 200 mg by mouth 2 (two) times daily.) 60 capsule 1  . gabapentin (NEURONTIN) 300 MG capsule TAKE 2 CAPSULES (600 MG TOTAL) BY MOUTH AT BEDTIME. 90 capsule 4  . methocarbamol (ROBAXIN) 500 MG tablet Take 1 tablet (500 mg total) by mouth every 6 (six) hours as needed for muscle spasms. 30 tablet 1  . naproxen sodium (ALEVE) 220 MG tablet Take 220 mg by mouth 3 (three) times daily.    Marland Kitchen senna-docusate (SENOKOT-S) 8.6-50 MG tablet Take 2 tablets by mouth at bedtime. For AFTER surgery, do not take if having diarrhea (Patient taking differently: Take 2 tablets by mouth at bedtime.) 30 tablet 0   No current facility-administered medications for this visit.    OBJECTIVE: White woman who appears well  There were no vitals filed for this visit.   There is no height or weight on file to calculate BMI.   There were no vitals filed for this visit. ECOG FS:1 - Symptomatic but completely ambulatory  Telemedicine visit 01/25/2020  LAB RESULTS:  CMP     Component Value Date/Time   NA 139 12/28/2019 1322   NA 140 01/07/2017 1342   K 4.0 12/28/2019 1322   K 3.5 01/07/2017 1342   CL 102 12/28/2019 1322   CO2 29 12/28/2019 1322   CO2 27 01/07/2017 1342   GLUCOSE 140 (H) 12/28/2019 1322   GLUCOSE 148 (H) 01/07/2017 1342   BUN 10 12/28/2019 1322   BUN 6.2 (L) 01/07/2017 1342   CREATININE 0.79 12/28/2019 1322   CREATININE 0.7 01/07/2017 1342   CALCIUM 9.4 12/28/2019 1322   CALCIUM 9.4 01/07/2017 1342    PROT 7.4 12/28/2019 1322   PROT 7.0 01/07/2017 1342   ALBUMIN 3.7 12/28/2019 1322   ALBUMIN 3.7 01/07/2017 1342   AST 20 12/28/2019 1322   AST 23 01/07/2017 1342   ALT 19 12/28/2019 1322   ALT 24 01/07/2017 1342   ALKPHOS 96 12/28/2019 1322   ALKPHOS 105 01/07/2017 1342   BILITOT 0.9 12/28/2019 1322   BILITOT 0.84 01/07/2017 1342   GFRNONAA >60 12/28/2019 1322   GFRAA >60 05/23/2019 1921   GFRAA >60 10/13/2018 1346    No results found for: TOTALPROTELP, ALBUMINELP, A1GS, A2GS, BETS, BETA2SER, GAMS, MSPIKE, SPEI  No results found for: Nils Pyle, [facility]  Lab Results  Component Value Date   WBC 3.8 (L) 12/28/2019   NEUTROABS 2.0 12/28/2019   HGB 13.4 12/28/2019   HCT 39.8 12/28/2019   MCV 88.1 12/28/2019   PLT 175 12/28/2019      Chemistry      Component Value Date/Time   NA 139 12/28/2019 1322   NA 140 01/07/2017 1342   K 4.0 12/28/2019 1322   K 3.5 01/07/2017 1342   CL 102 12/28/2019 1322   CO2 29 12/28/2019 1322   CO2 27 01/07/2017 1342   BUN 10 12/28/2019 1322   BUN 6.2 (L) 01/07/2017 1342   CREATININE 0.79 12/28/2019 1322   CREATININE 0.7 01/07/2017 1342      Component Value Date/Time   CALCIUM 9.4 12/28/2019 1322   CALCIUM 9.4 01/07/2017 1342   ALKPHOS 96 12/28/2019 1322   ALKPHOS 105 01/07/2017 1342   AST 20 12/28/2019 1322   AST 23 01/07/2017 1342   ALT 19 12/28/2019 1322   ALT 24 01/07/2017 1342   BILITOT 0.9 12/28/2019 1322  BILITOT 0.84 01/07/2017 1342       No results found for: LABCA2  No components found for: [id number]  No results for input(s): INR in the last 168 hours.  Urinalysis    Component Value Date/Time   COLORURINE YELLOW 12/05/2019 1154   APPEARANCEUR CLOUDY (A) 12/05/2019 1154   LABSPEC 1.024 12/05/2019 1154   LABSPEC 1.025 11/23/2016 1251   PHURINE 5.0 12/05/2019 1154   GLUCOSEU NEGATIVE 12/05/2019 1154   GLUCOSEU Negative 11/23/2016 1251   HGBUR SMALL (A) 12/05/2019 1154   BILIRUBINUR  NEGATIVE 12/05/2019 1154   BILIRUBINUR Negative 11/23/2016 1251   KETONESUR NEGATIVE 12/05/2019 1154   PROTEINUR NEGATIVE 12/05/2019 1154   UROBILINOGEN 0.2 11/23/2016 1251   NITRITE NEGATIVE 12/05/2019 1154   LEUKOCYTESUR NEGATIVE 12/05/2019 1154   LEUKOCYTESUR Negative 11/23/2016 1251    STUDIES: NM Sentinel Node Inj-No Rpt (Breast)  Result Date: 01/09/2020 Sulfur colloid was injected by the nuclear medicine technologist for melanoma sentinel node.   DG Fluoro Guide CV Line Right  Result Date: 01/09/2020 CLINICAL DATA:  50 year old female with surgical port catheter EXAM: CENTRAL VENOUS CATHETER WITH FLUOROSCOPY CONTRAST:  Op note FLUOROSCOPY TIME:  1 minutes COMPARISON:  None. FINDINGS: Single intraoperative fluoroscopic spot image of the right chest demonstrates port catheter from right IJ approach with hemostat projecting in the subclavian region. IMPRESSION: Limited images in the operating room of the right chest demonstrating catheter from right IJ approach. Please refer to the dictated operative report for full details of intraoperative findings and procedure. Electronically Signed   By: Corrie Mckusick D.O.   On: 01/09/2020 13:28     ELIGIBLE FOR AVAILABLE RESEARCH PROTOCOL: no   ASSESSMENT: 50 y.o. [place] woman status post left breast upper outer quadrant and left axillary lymph node biopsy 06/19/2016, both positive for a clinical T2-T3 N1, stage IIIB-C invasive ductal carcinoma, grade 3, triple negative, with an MIB-1 of 90%  (1) genetics testing 08/04/2016 showed a variant of uncertain significance in the BRCA2 namely c.8169T>A (p.[id number]). 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 Additional testing of the patient's mother and maternal aunt is pending. Otherwise [facility]'s Common Hereditary Cancers Panel found no deleterious mutations in APC, ATM, AXIN2, BARD1, BMPR1A, BRCA1, BRCA2, BRIP1, CDH1, CDKN2A, CHEK2, CTNNA1, DICER1, EPCAM,  GREM1, HOXB13, KIT, MEN1, MLH1, MSH2, MSH3, MSH6, MUTYH, NBN, NF1, NTHL1, PALB2, PDGFRA, PMS2, POLD1, POLE, PTEN, RAD50, RAD51C, RAD51D, SDHA, SDHB, SDHC, SDHD, SMAD4, SMARCA4, STK11, TP53, TSC1, TSC2, and VHL.  (2) neoadjuvant chemotherapy consisting of doxorubicin and cyclophosphamide in dose dense fashion 4 completed 09/10/2016 followed by paclitaxel weekly 12 given with carboplatin   (a) cycle 4 of cyclophosphamide and doxorubicin was delayed 10 days and dose decreased 10% because of febrile neutropenia after cycle 3  (b) cycle 6 of Paclitaxel and Carboplatin delayed due to neutropenia, therefore Granix added to Wednesday, Thursday, Friday following chemotherapy days  (3) status post left lumpectomy and left axillary sentinel lymph node sampling 01/27/2017 with pathology showing a complete pathologic response (ypT0 ypN0);   (a) 5 left axillary lymph nodes removed  (4) adjuvant radiation 03/11/2017-04/21/2017 Site/dose:   1. Left breast, 2 Gy in 25 fractions for a total dose of 50 Gy                      2. Boost, 2 Gy in 5 fractions for a total dose of 10 Gy  (5) BRCA2 positivity  (a) status post bilateral mastectomies January 2022  (  b) referral to dermatology placed 10/16/2019 for melanoma screening  (c) referral to ophthalmology for yearly funduscopic exam  (d) referral to gynecologic oncology 10/16/2019 for BSO  (6) highly suspicious cecal polyp biopsy 12/06/2019  (a) partial colectomy planned January 2022  SECOND LEFT BREAST CANCER: (7) status post left breast biopsy 11/24/2019 for an invasive ductal carcinoma, grade 3, estrogen receptor 80% positive, with weak staining intensity, HER-2 negative, progesterone receptor negative, with an MIB-1 of 90%.  (8) status post bilateral mastectomies 01/09/2020 showing  (a) on the right, no evidence of malignancy  (b) on the left, a pT2 pNX, stage IIB invasive ductal carcinoma, grade 3, with ample margins   (c) repeat prognostic panel on the  left again weakly estrogen receptor positive, HER2 [2+]negative, progesterone receptor negative  (9) adjuvant therapy anticipated with cyclophosphamide, methotrexate, fluorouracil (CMF) given every 21 days x 8 to start 02/12/2020  (10) olaparib, adjuvant estrogens to follow  (11) consider eventual treatment as per DESTINY trial  (12) foundation 1 requested on 01/09/2020 material   PLAN: Annabel did generally well with her bilateral mastectomies although she is still in significant pain.  We reviewed this at length today.  She is going to take Aleve 220 mg with Tylenol 500 mg together 3 times daily.  She will continue to take her gabapentin at 100 mg twice daily and 600 mg at bedtime, and if that does not control the pain she will add tramadol, 50 to 100 mg up to 4 times a day as needed.  I think this is all she will need to get her through the next couple of weeks after which the pain I expect will wane.    She has also been referred to physical therapy and prescribed muscle relaxants by her surgeon.  We discussed her chemotherapy which will consist of CMF.  She has a good understanding of the possible toxicities side effects and complications of this agent.  I think we should be able to start on 02/12/2020.  I will see her that morning to confirm and if she is ready then the plan is for 8 cycles given 21 days apart  Her tumor is HER2 negative but 2+ by immunohistochemistry as there is some data that Enhertu may be useful in these cases.  We will need to keep that in mind and if there is an indication through the FDA for this we could add that at some point in the future.  She may also be a good candidate for PARP inhibitors.  Of course she will benefit from antiestrogens  Total encounter time 25 minutes.*    Develle Sievers, [name], MD  01/26/20 11:24 AM Medical Oncology and Hematology [facility] [place], [place] [address] Tel. [phone]    Fax.  [phone]   I, Wilburn Mylar, am acting as scribe for Dr. [name]. Eloise Picone.  I, Lurline Del MD, have reviewed the above documentation for accuracy and completeness, and I agree with the above.   *Total Encounter Time as defined by the Centers for Medicare and Medicaid Services includes, in addition to the face-to-face time of a patient visit (documented in the note above) non-face-to-face time: obtaining and reviewing outside history, ordering and reviewing medications, tests or procedures, care coordination (communications with other health care professionals or caregivers) and documentation in the medical record.

## 2020-01-24 NOTE — H&P (View-Only) (Signed)
[place]  [phone] [phone]     ID: [name] DOB: 1970/03/02  MR#: [id number]  FWY#:[id number]  Patient Care Team: [name], MD as PCP - General (Internal Medicine) Arletha Marschke, [name], MD as Consulting Physician (Oncology) [name], MD as Attending Physician (Radiation Oncology) Delice Bison Charlestine Massed, NP as Nurse Practitioner (Hematology and Oncology) Rolm Bookbinder, MD as Consulting Physician (General Surgery) Claudia Desanctis Steffanie Dunn, MD as Consulting Physician (Plastic Surgery) Mauro Kaufmann, RN as Oncology Nurse Navigator Rockwell Germany, RN as Oncology Nurse Navigator Milus Banister, MD as Attending Physician (Gastroenterology) OTHER MD:  I connected with Bary Leriche on 01/26/20 at  9:15 AM EST by video enabled telemedicine visit and verified that I am speaking with the correct person using two identifiers.   I discussed the limitations, risks, security and privacy concerns of performing an evaluation and management service by telemedicine and the availability of in-person appointments. I also discussed with the patient that there may be a patient responsible charge related to this service. The patient expressed understanding and agreed to proceed.   Other persons participating in the visit and their role in the encounter: husband [name]  Patient's location: home Provider's location: [place]    CHIEF COMPLAINT: Triple negative breast cancer; BRCA2 positive  CURRENT TREATMENT: to start adjuvant chemotherapy   INTERVAL HISTORY: Cohen was contacted today for a follow-up of her triple negative breast cancer accompanied by her husband [name].   Since her last visit, she underwent bilateral mastectomies on 01/09/2020 under [name]. Pathology from the procedure ([id number] ) showed: A. RIGHT BREAST  - complex sclerosing lesion  - fibrocystic changes with usual ductal  hyperplasia and calcifications B. LEFT BREAST  - invasive ductal carcinoma, 2.1 cm, grade 3  - margins not involved  She had a port placed at the same time.  She is scheduled for colonoscopy on 02/01/2020 under Dr. Ardis Hughs.  Her bilateral salpingo-oophorectomy and hysterectomy has been postponed until after the above procedures have been completed.   REVIEW OF SYSTEMS: [name] tells me she has an "incredible peace" of mind following her surgery.  This was definitely the right decision for her.  On the other hand she still has significant pain.  She says the pain can make her cry it is so sharp and difficult.  She was prescribed gabapentin and she takes 100 mg twice daily and then 3 to 600 mg at night.  She says the 100 mg tablets do not make her too sleepy but they do not allow her to function very well for example at work.  200 mg would make her sleepy.  She is taking 800 mg of Motrin irregularly and 1000 mg of Tylenol also irregularly.  She was prescribed Robaxin by [name] and that is helping a little but not enough she says the pain is the main issue right now.  She is just beginning to get used to the way the incisions look.  Aside from these issues a detailed review of systems today was stable   COVID 19 VACCINATION STATUS:    BREAST CANCER HISTORY: From the original intake note:  The patient was seen in the emergency room 05/16/2016 with nonspecific complaints but also noting that she had a tender lump in her lateral left breast which she said she had noted the day before. Exam by the emergency room physician confirmed a 3 cm soft left lateral breast mass without overlying erythema  or nipple changes. This was felt to be most consistent with fibrocystic change but the patient was referred back to her primary care physician for further evaluation. She saw the physician assistant in May 18 and then Dr. Wynetta Emery on 06/12/2016 who scheduled a bilateral diagnostic mammography with tomography  and left breast ultrasonography at the [facility] 06/18/2016. This found the breast density to be category B. In the left breast upper outer quadrant there was an area of asymmetry measuring up to 10.3 cm. On exam this was firm and palpable and on ultrasound there was an irregular mass measuring 2.5 cm with some subtle changes in the surrounding tissue. In the left lower axilla there were 2 suspicious-looking lymph nodes.  Biopsy of the left breast upper outer quadrant and one of the suspicious lymph nodes 06/19/2016 showed ([id number]) both to be involved by invasive ductal carcinoma, grade 3, estrogen and progesterone receptor negative, HER-2 not amplified with a signals ratio being 1.53-1.74 and the number per cell 2.35-3.05. The MIB-1 was 90%.  The patient's subsequent history is as detailed below.   PAST MEDICAL HISTORY: Past Medical History:  Diagnosis Date  . Arthritis   . Breast cancer ([place])    Left Breast Cancer  . Breast cancer ([place]) 11/24/2019   left   . Complication of anesthesia    DAY SURGERY 2010 OR 2011 ASPIRATED AND STAYED OVERNIGHT  . History of radiation therapy 03/11/17- 04/21/17   Left breast, 2 Gy in 25 fractions for a total dose of 50 Gy, Boost, 2 Gy in 5 fractions for a total dose of 10 Gy  . Hypertension    OFF MEDS  SINCE CHEMO X 5 MONTHS  . Malignant neoplasm of upper-outer quadrant of left breast in female, estrogen receptor negative ([place]) 06/23/2016  . Personal history of chemotherapy 2019   Left Breast Cancer  07-2016-12-2016  . Personal history of radiation therapy 2019   Left Breast Cancer    PAST SURGICAL HISTORY: Past Surgical History:  Procedure Laterality Date  . ANKLE SURGERY    . BREAST LUMPECTOMY Left 01/27/2017  . BREAST LUMPECTOMY WITH RADIOACTIVE SEED AND SENTINEL LYMPH NODE BIOPSY Left 01/27/2017   Procedure: LEFT BREAST LUMPECTOMY WITH RADIOACTIVE SEED LOCALIZATION, LEFT AXILLARY DEEP LYMPH NODE BIOPSY WITH RADIOACTIVE SEED LOCALIZATION,  LEFT AXILLARY SENTINEL LYMPH NODE MAPPING AND BIOPSY WITH BLUE DYE INJECTION;  Surgeon: [name], MD;  Location: [facility];  Service: General;  Laterality: Left;  . IR FLUORO GUIDE PORT INSERTION RIGHT  07/14/2016  . IR US GUIDE VASC ACCESS RIGHT  07/14/2016  . MASTECTOMY W/ SENTINEL NODE BIOPSY Bilateral 01/09/2020   Procedure: BILATERAL MASTECTOMY WITH LEFT AXILLARY SENTINEL LYMPH NODE BIOPSY;  Surgeon: Rolm Bookbinder, MD;  Location: [facility];  Service: General;  Laterality: Bilateral;  . PORT-A-CATH REMOVAL Right 01/27/2017   Procedure: REMOVAL PORT-A-CATH;  Surgeon: [name], MD;  Location: [facility];  Service: General;  Laterality: Right;  . PORTACATH PLACEMENT Right 01/09/2020   Procedure: INSERTION PORT-A-CATH;  Surgeon: Rolm Bookbinder, MD;  Location: [facility];  Service: General;  Laterality: Right;  . TUBAL LIGATION    . WISDOM TOOTH EXTRACTION      FAMILY HISTORY Family History  Problem Relation Age of Onset  . Diabetes Mother   . Hypertension Mother   . Arthritis Mother   . Colon cancer Father 16       died .76 metastatic at time of diagnosis  . Diabetes Father   .  Heart disease Father   . Hypertension Father   . Breast cancer Maternal Grandmother 89       d.60s  . Breast cancer Maternal Aunt 52  . Cervical cancer Maternal Aunt 55  . Breast cancer Maternal Aunt 46       d.50s  . Cancer Maternal Uncle        d.62s unspecified type of cancer  . Breast cancer Cousin 77       paternal first-cousin (daughter of unaffected aunt)  . Cancer Maternal Aunt 62       "Female Cancer"  . Cancer Maternal Aunt        unknown cancer  . Brain cancer Daughter 9  . Esophageal cancer Other   . Colon polyps Neg Hx   . Rectal cancer Neg Hx   . Stomach cancer Neg Hx   The patient's father died at age 62 from colon cancer which she never had treated. The patient's mother is living, 28 years old as of June 2018. The patient has one brother, one  sister. On the mother's side there is a history of breast cancer in the grandmother, age 45, and then 1 aunt with breast cancer age 63, and another with lung cancer age 62. There is no history of ovarian cancer in the family.   GYNECOLOGIC HISTORY:  No LMP recorded (lmp unknown). Patient is postmenopausal. Menarche age 69, first live birth age 56. She is GX P4. Her periods have never been regular. She had a period in June 2018 but not for 4 months before that. She took birth control for some years remotely with no complications. She is status post bilateral tubal ligation.   SOCIAL HISTORY:  The patient's husband owns and runs a food truck.. They serve mostly [place] and [place] food. 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 Her husband Alanni Vader (goes by "[name]") is originally from [name]. The patient has two children from her first marriage. Her son [name], who is 7 years old as of Oct 2021, was previously in the [employer], but now is taking courses at [name] and  her 40 y/o daughter [name], who was studying psychology at [name] before her optic pathway germinoma, which has left her 97% blind.  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 scholarship, doing premed) and 65 as of October 2020    ADVANCED DIRECTIVES: In the absence of any documents to the contrary the patient's husband is her healthcare power of attorney   HEALTH MAINTENANCE: Social History   Tobacco Use  . Smoking status: Never Smoker  . Smokeless tobacco: Never Used  Vaping Use  . Vaping Use: Never used  Substance Use Topics  . Alcohol use: No  . Drug use: No     Colonoscopy: scheduled for 02/01/2020  SPQ:ZRAQ 2018  Bone density:Never   Allergies  Allergen Reactions  . Carboplatin Other (See Comments)     Red, blotchy, hot    Current Outpatient Medications  Medication Sig Dispense Refill  . traMADol (ULTRAM) 50 MG tablet Take 1-2 tablets (50-100 mg total) by mouth every 6 (six) hours as needed. (Patient taking differently: Take 50-100 mg by mouth every 6 (six) hours as needed for moderate pain.) 60 tablet 0  . acetaminophen (TYLENOL) 325 MG tablet Take 650 mg by mouth 3 (three)  times daily.    Marland Kitchen gabapentin (NEURONTIN) 100 MG capsule TAKE 2 CAPSULES BY MOUTH ONCE A DAY (Patient taking differently: Take 200 mg by mouth 2 (two) times daily.) 60 capsule 1  . gabapentin (NEURONTIN) 300 MG capsule TAKE 2 CAPSULES (600 MG TOTAL) BY MOUTH AT BEDTIME. 90 capsule 4  . methocarbamol (ROBAXIN) 500 MG tablet Take 1 tablet (500 mg total) by mouth every 6 (six) hours as needed for muscle spasms. 30 tablet 1  . naproxen sodium (ALEVE) 220 MG tablet Take 220 mg by mouth 3 (three) times daily.    Marland Kitchen senna-docusate (SENOKOT-S) 8.6-50 MG tablet Take 2 tablets by mouth at bedtime. For AFTER surgery, do not take if having diarrhea (Patient taking differently: Take 2 tablets by mouth at bedtime.) 30 tablet 0   No current facility-administered medications for this visit.    OBJECTIVE: White woman who appears well  There were no vitals filed for this visit.   There is no height or weight on file to calculate BMI.   There were no vitals filed for this visit. ECOG FS:1 - Symptomatic but completely ambulatory  Telemedicine visit 01/25/2020  LAB RESULTS:  CMP     Component Value Date/Time   NA 139 12/28/2019 1322   NA 140 01/07/2017 1342   K 4.0 12/28/2019 1322   K 3.5 01/07/2017 1342   CL 102 12/28/2019 1322   CO2 29 12/28/2019 1322   CO2 27 01/07/2017 1342   GLUCOSE 140 (H) 12/28/2019 1322   GLUCOSE 148 (H) 01/07/2017 1342   BUN 10 12/28/2019 1322   BUN 6.2 (L) 01/07/2017 1342   CREATININE 0.79 12/28/2019 1322   CREATININE 0.7 01/07/2017 1342   CALCIUM 9.4 12/28/2019 1322   CALCIUM 9.4 01/07/2017 1342    PROT 7.4 12/28/2019 1322   PROT 7.0 01/07/2017 1342   ALBUMIN 3.7 12/28/2019 1322   ALBUMIN 3.7 01/07/2017 1342   AST 20 12/28/2019 1322   AST 23 01/07/2017 1342   ALT 19 12/28/2019 1322   ALT 24 01/07/2017 1342   ALKPHOS 96 12/28/2019 1322   ALKPHOS 105 01/07/2017 1342   BILITOT 0.9 12/28/2019 1322   BILITOT 0.84 01/07/2017 1342   GFRNONAA >60 12/28/2019 1322   GFRAA >60 05/23/2019 1921   GFRAA >60 10/13/2018 1346    No results found for: TOTALPROTELP, ALBUMINELP, A1GS, A2GS, BETS, BETA2SER, GAMS, MSPIKE, SPEI  No results found for: Nils Pyle, [facility]  Lab Results  Component Value Date   WBC 3.8 (L) 12/28/2019   NEUTROABS 2.0 12/28/2019   HGB 13.4 12/28/2019   HCT 39.8 12/28/2019   MCV 88.1 12/28/2019   PLT 175 12/28/2019      Chemistry      Component Value Date/Time   NA 139 12/28/2019 1322   NA 140 01/07/2017 1342   K 4.0 12/28/2019 1322   K 3.5 01/07/2017 1342   CL 102 12/28/2019 1322   CO2 29 12/28/2019 1322   CO2 27 01/07/2017 1342   BUN 10 12/28/2019 1322   BUN 6.2 (L) 01/07/2017 1342   CREATININE 0.79 12/28/2019 1322   CREATININE 0.7 01/07/2017 1342      Component Value Date/Time   CALCIUM 9.4 12/28/2019 1322   CALCIUM 9.4 01/07/2017 1342   ALKPHOS 96 12/28/2019 1322   ALKPHOS 105 01/07/2017 1342   AST 20 12/28/2019 1322   AST 23 01/07/2017 1342   ALT 19 12/28/2019 1322   ALT 24 01/07/2017 1342   BILITOT 0.9 12/28/2019 1322  BILITOT 0.84 01/07/2017 1342       No results found for: LABCA2  No components found for: [id number]  No results for input(s): INR in the last 168 hours.  Urinalysis    Component Value Date/Time   COLORURINE YELLOW 12/05/2019 1154   APPEARANCEUR CLOUDY (A) 12/05/2019 1154   LABSPEC 1.024 12/05/2019 1154   LABSPEC 1.025 11/23/2016 1251   PHURINE 5.0 12/05/2019 1154   GLUCOSEU NEGATIVE 12/05/2019 1154   GLUCOSEU Negative 11/23/2016 1251   HGBUR SMALL (A) 12/05/2019 1154   BILIRUBINUR  NEGATIVE 12/05/2019 1154   BILIRUBINUR Negative 11/23/2016 1251   KETONESUR NEGATIVE 12/05/2019 1154   PROTEINUR NEGATIVE 12/05/2019 1154   UROBILINOGEN 0.2 11/23/2016 1251   NITRITE NEGATIVE 12/05/2019 1154   LEUKOCYTESUR NEGATIVE 12/05/2019 1154   LEUKOCYTESUR Negative 11/23/2016 1251    STUDIES: NM Sentinel Node Inj-No Rpt (Breast)  Result Date: 01/09/2020 Sulfur colloid was injected by the nuclear medicine technologist for melanoma sentinel node.   DG Fluoro Guide CV Line Right  Result Date: 01/09/2020 CLINICAL DATA:  50 year old female with surgical port catheter EXAM: CENTRAL VENOUS CATHETER WITH FLUOROSCOPY CONTRAST:  Op note FLUOROSCOPY TIME:  1 minutes COMPARISON:  None. FINDINGS: Single intraoperative fluoroscopic spot image of the right chest demonstrates port catheter from right IJ approach with hemostat projecting in the subclavian region. IMPRESSION: Limited images in the operating room of the right chest demonstrating catheter from right IJ approach. Please refer to the dictated operative report for full details of intraoperative findings and procedure. Electronically Signed   By: Corrie Mckusick D.O.   On: 01/09/2020 13:28     ELIGIBLE FOR AVAILABLE RESEARCH PROTOCOL: no   ASSESSMENT: 50 y.o. [place] woman status post left breast upper outer quadrant and left axillary lymph node biopsy 06/19/2016, both positive for a clinical T2-T3 N1, stage IIIB-C invasive ductal carcinoma, grade 3, triple negative, with an MIB-1 of 90%  (1) genetics testing 08/04/2016 showed a variant of uncertain significance in the BRCA2 namely c.8169T>A (p.Asp2723Glu). 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 Additional testing of the patient's mother and maternal aunt is pending. Otherwise [facility]'s Common Hereditary Cancers Panel found no deleterious mutations in APC, ATM, AXIN2, BARD1, BMPR1A, BRCA1, BRCA2, BRIP1, CDH1, CDKN2A, CHEK2, CTNNA1, DICER1, EPCAM,  GREM1, HOXB13, KIT, MEN1, MLH1, MSH2, MSH3, MSH6, MUTYH, NBN, NF1, NTHL1, PALB2, PDGFRA, PMS2, POLD1, POLE, PTEN, RAD50, RAD51C, RAD51D, SDHA, SDHB, SDHC, SDHD, SMAD4, SMARCA4, STK11, TP53, TSC1, TSC2, and VHL.  (2) neoadjuvant chemotherapy consisting of doxorubicin and cyclophosphamide in dose dense fashion 4 completed 09/10/2016 followed by paclitaxel weekly 12 given with carboplatin   (a) cycle 4 of cyclophosphamide and doxorubicin was delayed 10 days and dose decreased 10% because of febrile neutropenia after cycle 3  (b) cycle 6 of Paclitaxel and Carboplatin delayed due to neutropenia, therefore Granix added to Wednesday, Thursday, Friday following chemotherapy days  (3) status post left lumpectomy and left axillary sentinel lymph node sampling 01/27/2017 with pathology showing a complete pathologic response (ypT0 ypN0);   (a) 5 left axillary lymph nodes removed  (4) adjuvant radiation 03/11/2017-04/21/2017 Site/dose:   1. Left breast, 2 Gy in 25 fractions for a total dose of 50 Gy                      2. Boost, 2 Gy in 5 fractions for a total dose of 10 Gy  (5) BRCA2 positivity  (a) status post bilateral mastectomies January 2022  (  b) referral to dermatology placed 10/16/2019 for melanoma screening  (c) referral to ophthalmology for yearly funduscopic exam  (d) referral to gynecologic oncology 10/16/2019 for BSO  (6) highly suspicious cecal polyp biopsy 12/06/2019  (a) partial colectomy planned January 2022  SECOND LEFT BREAST CANCER: (7) status post left breast biopsy 11/24/2019 for an invasive ductal carcinoma, grade 3, estrogen receptor 80% positive, with weak staining intensity, HER-2 negative, progesterone receptor negative, with an MIB-1 of 90%.  (8) status post bilateral mastectomies 01/09/2020 showing  (a) on the right, no evidence of malignancy  (b) on the left, a pT2 pNX, stage IIB invasive ductal carcinoma, grade 3, with ample margins   (c) repeat prognostic panel on the  left again weakly estrogen receptor positive, HER2 [2+]negative, progesterone receptor negative  (9) adjuvant therapy anticipated with cyclophosphamide, methotrexate, fluorouracil (CMF) given every 21 days x 8 to start 02/12/2020  (10) olaparib, adjuvant estrogens to follow  (11) consider eventual treatment as per DESTINY trial  (12) foundation 1 requested on 01/09/2020 material   PLAN: Esabella did generally well with her bilateral mastectomies although she is still in significant pain.  We reviewed this at length today.  She is going to take Aleve 220 mg with Tylenol 500 mg together 3 times daily.  She will continue to take her gabapentin at 100 mg twice daily and 600 mg at bedtime, and if that does not control the pain she will add tramadol, 50 to 100 mg up to 4 times a day as needed.  I think this is all she will need to get her through the next couple of weeks after which the pain I expect will wane.    She has also been referred to physical therapy and prescribed muscle relaxants by her surgeon.  We discussed her chemotherapy which will consist of CMF.  She has a good understanding of the possible toxicities side effects and complications of this agent.  I think we should be able to start on 02/12/2020.  I will see her that morning to confirm and if she is ready then the plan is for 8 cycles given 21 days apart  Her tumor is HER2 negative but 2+ by immunohistochemistry as there is some data that Enhertu may be useful in these cases.  We will need to keep that in mind and if there is an indication through the FDA for this we could add that at some point in the future.  She may also be a good candidate for PARP inhibitors.  Of course she will benefit from antiestrogens  Total encounter time 25 minutes.*    Jennifermarie Franzen, [name], MD  01/26/20 11:24 AM Medical Oncology and Hematology [facility] [place], [place] [address] Tel. [phone]    Fax.  [phone]   I, Wilburn Mylar, am acting as scribe for Dr. [name]. Meiya Wisler.  I, Lurline Del MD, have reviewed the above documentation for accuracy and completeness, and I agree with the above.   *Total Encounter Time as defined by the Centers for Medicare and Medicaid Services includes, in addition to the face-to-face time of a patient visit (documented in the note above) non-face-to-face time: obtaining and reviewing outside history, ordering and reviewing medications, tests or procedures, care coordination (communications with other health care professionals or caregivers) and documentation in the medical record.

## 2020-01-25 ENCOUNTER — Inpatient Hospital Stay: Payer: Medicaid Other | Attending: Oncology | Admitting: Oncology

## 2020-01-25 ENCOUNTER — Other Ambulatory Visit (HOSPITAL_COMMUNITY): Payer: Self-pay | Admitting: Oncology

## 2020-01-25 ENCOUNTER — Encounter: Payer: Self-pay | Admitting: Oncology

## 2020-01-25 DIAGNOSIS — C50912 Malignant neoplasm of unspecified site of left female breast: Secondary | ICD-10-CM | POA: Diagnosis not present

## 2020-01-25 DIAGNOSIS — Z1501 Genetic susceptibility to malignant neoplasm of breast: Secondary | ICD-10-CM

## 2020-01-25 DIAGNOSIS — Z171 Estrogen receptor negative status [ER-]: Secondary | ICD-10-CM

## 2020-01-25 DIAGNOSIS — K429 Umbilical hernia without obstruction or gangrene: Secondary | ICD-10-CM | POA: Diagnosis not present

## 2020-01-25 DIAGNOSIS — Z6841 Body Mass Index (BMI) 40.0 and over, adult: Secondary | ICD-10-CM

## 2020-01-25 DIAGNOSIS — Z1509 Genetic susceptibility to other malignant neoplasm: Secondary | ICD-10-CM

## 2020-01-25 DIAGNOSIS — Z9013 Acquired absence of bilateral breasts and nipples: Secondary | ICD-10-CM

## 2020-01-25 DIAGNOSIS — C50412 Malignant neoplasm of upper-outer quadrant of left female breast: Secondary | ICD-10-CM | POA: Diagnosis not present

## 2020-01-25 DIAGNOSIS — Z1502 Genetic susceptibility to malignant neoplasm of ovary: Secondary | ICD-10-CM

## 2020-01-25 MED ORDER — TRAMADOL HCL 50 MG PO TABS: 50.0000 mg | ORAL_TABLET | Freq: Four times a day (QID) | ORAL | 0 refills | Status: DC | PRN

## 2020-01-25 MED FILL — traMADol HCL 50 MG TABS: 50 | 5 days supply | Qty: 40 | Fill #0

## 2020-01-25 MED FILL — GABAPENTIN 100 MG CAPSULE: 100 | 30 days supply | Qty: 120 | Fill #0

## 2020-01-26 ENCOUNTER — Other Ambulatory Visit: Payer: Self-pay | Admitting: Oncology

## 2020-01-26 ENCOUNTER — Encounter: Payer: Self-pay | Admitting: *Deleted

## 2020-01-26 MED ORDER — PROCHLORPERAZINE MALEATE 10 MG PO TABS: 10.0000 mg | ORAL_TABLET | Freq: Four times a day (QID) | ORAL | 1 refills | Status: DC | PRN

## 2020-01-26 MED ORDER — LIDOCAINE-PRILOCAINE 2.5-2.5 % EX CREA: TOPICAL_CREAM | CUTANEOUS | 3 refills | Status: DC

## 2020-01-26 MED FILL — PROCHLORPERAZINE 10 MG TAB: 10 | 7 days supply | Qty: 30 | Fill #0

## 2020-01-26 MED FILL — LIDOCAINE-PRILOCAINE CREAM: 2.5-2.5 | 15 days supply | Qty: 30 | Fill #0

## 2020-01-26 NOTE — Progress Notes (Signed)
DISCONTINUE ON PATHWAY REGIMEN - Breast     A cycle is every 21 days:     Doxorubicin      Cyclophosphamide      Paclitaxel      Carboplatin   **Always confirm dose/schedule in your pharmacy ordering system**  REASON: Disease Progression PRIOR TREATMENT: [id number]: AC [Doxorubicin + Cyclophosphamide q21 Days x 4 Cycles], Followed by Paclitaxel 80 mg/m2 Weekly + Carboplatin AUC=6 q21 Days x 12 Weeks TREATMENT RESPONSE: Unable to Evaluate  START OFF PATHWAY REGIMEN - Breast   [id number]:CMF (IV cyclophosphamide) q21 days:   A cycle is every 21 days:     Cyclophosphamide      Methotrexate      Fluorouracil   **Always confirm dose/schedule in your pharmacy ordering system**  Patient Characteristics: Locoregional  Recurrent Disease - Resected, HER2 Negative/Unknown/Equivocal, ER Positive, Postmenopausal Therapeutic Status: Locoregional Recurrent Disease - Resected ER Status: Positive (+) HER2 Status: Negative (-) PR Status: Negative (-) Menopausal Status: Postmenopausal Intent of Therapy: Curative Intent, Discussed with Patient

## 2020-01-29 ENCOUNTER — Telehealth: Payer: Self-pay | Admitting: *Deleted

## 2020-01-29 ENCOUNTER — Other Ambulatory Visit (HOSPITAL_COMMUNITY)
Admission: RE | Admit: 2020-01-29 | Discharge: 2020-01-29 | Disposition: A | Discharge status: Home / Self Care | Payer: Medicaid Other | Source: Ambulatory Visit | Attending: Gastroenterology | Admitting: Gastroenterology

## 2020-01-29 ENCOUNTER — Telehealth: Payer: Self-pay | Admitting: Oncology

## 2020-01-29 DIAGNOSIS — Z20822 Contact with and (suspected) exposure to covid-19: Secondary | ICD-10-CM | POA: Diagnosis not present

## 2020-01-29 DIAGNOSIS — Z01812 Encounter for preprocedural laboratory examination: Secondary | ICD-10-CM | POA: Diagnosis present

## 2020-01-29 LAB — SARS CORONAVIRUS 2 (TAT 6-24 HRS): SARS Coronavirus 2: NEGATIVE

## 2020-01-29 NOTE — Telephone Encounter (Signed)
Scheduled appts per 1/20 los. Pt confirmed appt date and time.

## 2020-01-29 NOTE — Telephone Encounter (Signed)
Request for Foundation 1 from most recent pathology completed and faxed to [place] who will complete and order test.

## 2020-01-30 ENCOUNTER — Ambulatory Visit: Payer: Medicaid Other | Admitting: Oncology

## 2020-01-30 ENCOUNTER — Other Ambulatory Visit: Payer: Self-pay

## 2020-01-30 ENCOUNTER — Encounter: Payer: Self-pay | Admitting: Oncology

## 2020-01-30 ENCOUNTER — Ambulatory Visit: Payer: Medicaid Other | Attending: General Surgery | Admitting: Rehabilitation

## 2020-01-30 ENCOUNTER — Encounter: Payer: Self-pay | Admitting: Rehabilitation

## 2020-01-30 ENCOUNTER — Encounter: Payer: Self-pay | Admitting: *Deleted

## 2020-01-30 ENCOUNTER — Telehealth: Payer: Self-pay | Admitting: Oncology

## 2020-01-30 ENCOUNTER — Other Ambulatory Visit: Payer: Medicaid Other

## 2020-01-30 DIAGNOSIS — M25611 Stiffness of right shoulder, not elsewhere classified: Secondary | ICD-10-CM | POA: Diagnosis present

## 2020-01-30 DIAGNOSIS — R293 Abnormal posture: Secondary | ICD-10-CM | POA: Diagnosis present

## 2020-01-30 DIAGNOSIS — Z483 Aftercare following surgery for neoplasm: Secondary | ICD-10-CM | POA: Diagnosis present

## 2020-01-30 DIAGNOSIS — M25612 Stiffness of left shoulder, not elsewhere classified: Secondary | ICD-10-CM | POA: Diagnosis present

## 2020-01-30 NOTE — Therapy (Signed)
[place], [place], [address] Phone: [phone]   Fax:  [phone]  Physical Therapy Evaluation  Patient Details  Name: [name] MRN: [id number] Date of Birth: Apr 14, 1970 Referring Provider (PT): [name]   Encounter Date: 01/30/2020   PT End of Session - 01/30/20 1713    Visit Number 1    Number of Visits 9    Date for PT Re-Evaluation 03/12/20    Authorization Type M[employer] healthy blue    Authorization - Number of Visits 27    PT Start Time 1600    PT Stop Time 1706    PT Time Calculation (min) 66 min    Activity Tolerance Patient tolerated treatment well    Behavior During Therapy [facility] for tasks assessed/performed           Past Medical History:  Diagnosis Date  . Arthritis   . Breast cancer ([place])    Left Breast Cancer  . Breast cancer ([place]) 11/24/2019   left   . Complication of anesthesia    DAY SURGERY 2010 OR 2011 ASPIRATED AND STAYED OVERNIGHT  . History of radiation therapy 03/11/17- 04/21/17   Left breast, 2 Gy in 25 fractions for a total dose of 50 Gy, Boost, 2 Gy in 5 fractions for a total dose of 10 Gy  . Hypertension    OFF MEDS  SINCE CHEMO X 5 MONTHS  . Malignant neoplasm of upper-outer quadrant of left breast in female, estrogen receptor negative ([place]) 06/23/2016  . Personal history of chemotherapy 2019   Left Breast Cancer  07-2016-12-2016  . Personal history of radiation therapy 2019   Left Breast Cancer    Past Surgical History:  Procedure Laterality Date  . ANKLE SURGERY    . BREAST LUMPECTOMY Left 01/27/2017  . BREAST LUMPECTOMY WITH RADIOACTIVE SEED AND SENTINEL LYMPH NODE BIOPSY Left 01/27/2017   Procedure: LEFT BREAST LUMPECTOMY WITH RADIOACTIVE SEED LOCALIZATION, LEFT AXILLARY DEEP LYMPH NODE BIOPSY WITH RADIOACTIVE SEED LOCALIZATION, LEFT AXILLARY SENTINEL LYMPH NODE MAPPING AND BIOPSY WITH BLUE DYE INJECTION;  Surgeon: [name], MD;  Location:  [place];  Service: General;  Laterality: Left;  . IR FLUORO GUIDE PORT INSERTION RIGHT  07/14/2016  . IR US GUIDE VASC ACCESS RIGHT  07/14/2016  . MASTECTOMY W/ SENTINEL NODE BIOPSY Bilateral 01/09/2020   Procedure: BILATERAL MASTECTOMY WITH LEFT AXILLARY SENTINEL LYMPH NODE BIOPSY;  Surgeon: Rolm Bookbinder, MD;  Location: [facility];  Service: General;  Laterality: Bilateral;  . PORT-A-CATH REMOVAL Right 01/27/2017   Procedure: REMOVAL PORT-A-CATH;  Surgeon: [name], MD;  Location: [place] [facility];  Service: General;  Laterality: Right;  . PORTACATH PLACEMENT Right 01/09/2020   Procedure: INSERTION PORT-A-CATH;  Surgeon: Rolm Bookbinder, MD;  Location: [facility];  Service: General;  Laterality: Right;  . TUBAL LIGATION    . WISDOM TOOTH EXTRACTION      There were no vitals filed for this visit.    Subjective Assessment - 01/30/20 1605    Subjective The left side in numb and maybe infected.  The right side has one spot that is really tight and painful    Pertinent History 2019:left lumpectomy with 5 nodes removed with radiation and chemotherapy, then recurrence on the left with bilateral mastectomy on 01/09/20 with [name] with no additional lymph nodes removed.    Limitations Lifting    Patient Stated Goals decrease the pain    Currently in Pain? Yes  Pain Score 1    when I move the right arm its up to 6-7/10   Pain Location Flank    Pain Orientation Right    Pain Descriptors / Indicators Aching;Burning    Pain Type Surgical pain    Pain Onset 1 to 4 weeks ago    Pain Frequency Intermittent    Aggravating Factors  moving the arm, sleeping on the side    Pain Relieving Factors compression, compression bra              [facility] PT Assessment - 01/30/20 0001      Assessment   Medical Diagnosis bil mastectomy    Referring Provider (PT) 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  No    Is the patient reluctant to leave their home because of a fear of falling?  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  Lt chest appearing red with dead sloughy tissue and sloughy yellow discharge on gauze.  Possible heat.  [name] notfied with chart message along with photo Pt has also sent photo and message to oncologist.      Posture/Postural Control   Posture/Postural Control Postural limitations    Postural Limitations Rounded Shoulders;Forward head;Increased thoracic kyphosis      ROM / Strength   AROM / PROM / Strength AROM      AROM   Overall AROM Comments not tested today due to pain                        Objective measurements completed on examination: See above findings.       [facility] Adult PT Treatment/Exercise - 01/30/20 0001      Posture/Postural Control   Posture Comments very guarded and difficulty relaxing arms to the sides. Work in standing on arms to the sides, shoulder elevation and depression and scapular retractions      Exercises    Exercises Other Exercises    Other Exercises  gave pt post op sheet of exercises to start as tolerated at home      Manual Therapy   Manual therapy comments daughter present who is a massage therapist was shown how to do Rt axillo inguinal pathway for increased comfort.  Small pillow given to patient for comfort  PT Education - 01/30/20 1712    Education Details post op motions, posture, arms by sides, daughter MLD for lateral trunk    Person(s) Educated Patient;Child(ren)    Methods Explanation;Demonstration;Tactile cues;Verbal cues;Handout    Comprehension Verbalized understanding;Returned demonstration;Verbal cues required;Tactile cues required;Need further instruction               PT Long Term Goals - 01/30/20 1720      PT LONG TERM GOAL #1   Title Pt will improve bil shoulder AROM to WNL for increased functional mobility    Time 8    Period Weeks    Status New      PT LONG TERM GOAL #2   Title Pt will report pain in the Rt lateral trunk as improved by at least 75%    Time 6    Period Weeks    Status New      PT LONG TERM GOAL #3   Title Pt will be able to sleep in any position in bed without increased pain    Time 6    Period Weeks    Status New      PT LONG TERM GOAL #4   Title Pt will be ind with final HEP for duration of chemotherapy    Time 6    Period Weeks    Status New                  Plan - 01/30/20 1714    Clinical Impression Statement Pt returns to PT 3 weeks after bil mastectomy due to recurrence in the left breast.  Pt is currently having nerve related intense pain around the Rt latissimus wing/near the end of the chest incision that increases with movement.  Pt has increased muscle tension, guarding, decrease AROM bilaterally, pain, and decreased functional status that will improve with PT at this time.    Personal Factors and Comorbidities Comorbidity 2;Fitness    Comorbidities SLNB, history of radiation     Examination-Activity Limitations Bed Mobility;Reach Overhead;Caring for Others;Carry;Sleep;Dressing;Hygiene/Grooming    Examination-Participation Restrictions Cleaning;Meal Prep;Yard Work;Community Activity;Occupation;Driving;Laundry    Stability/Clinical Decision Making Evolving/Moderate complexity    Clinical Decision Making Moderate    Rehab Potential Excellent    PT Frequency 2x / week    PT Duration 6 weeks    PT Treatment/Interventions ADLs/Self Care Home Management;Therapeutic exercise;Therapeutic activities;Patient/family education;Manual lymph drainage;Manual techniques;Passive range of motion;Taping;Compression bandaging;Scar mobilization    PT Next Visit Plan any word on possible infection? measure AROM if able, measure circumferences,  bil AAROM/PROM, MLD of the Rt lateral trunk teaching daughter as needed, postural awareness    PT Home Exercise Plan post op, and arm relaxation work    Recommended Other Services has compression bras    Consulted and Agree with Plan of Care Patient           Patient will benefit from skilled therapeutic intervention in order to improve the following deficits and impairments:  Decreased range of motion,Decreased strength,Impaired UE functional use,Decreased scar mobility,Postural dysfunction,Pain,Decreased skin integrity  Visit Diagnosis: Abnormal posture  Stiffness of left shoulder, not elsewhere classified  Stiffness of right shoulder, not elsewhere classified  Aftercare following surgery for neoplasm     Problem List Patient Active Problem List   Diagnosis Date Noted  . S/P bilateral mastectomy 01/09/2020  . Recurrent breast cancer, left ([place]) 12/16/2019  . Umbilical hernia without obstruction and without gangrene 11/07/2019  . BRCA2 gene mutation positive in female 10/16/2019  . Port-A-Cath  in place 11/02/2016  . Cellulitis 10/24/2016  . Family history of breast cancer 10/08/2016  . Genetic testing 09/21/2016  . Sepsis ([place])  08/27/2016  . Rash 08/27/2016  . Hypokalemia 08/27/2016  . Neutropenic fever ([place]) 08/26/2016  . Constipation 07/27/2016  . Malignant neoplasm of upper-outer quadrant of left breast in female, estrogen receptor negative ([place]) 06/23/2016  . Left breast mass 06/12/2016  . Essential hypertension 06/12/2016  . Class 3 severe obesity due to excess calories without serious comorbidity with body mass index (BMI) of 40.0 to 44.9 in adult ([place]) 06/12/2016  . Low vitamin D level 06/12/2016  . Irregular menses 06/12/2016    Stark Bray 01/30/2020, 5:24 PM  [place], [place], [address] Phone: [phone]   Fax:  [phone]  Name: [name] MRN: [id number] Date of Birth: 08-12-1970  Check all possible CPT codes: 12197- Therapeutic Exercise, 97140 - Manual Therapy and 58832 - Self Care

## 2020-01-30 NOTE — Telephone Encounter (Signed)
Scheduled appt per 1/24 sch msg - pt is aware of appt date and time   

## 2020-01-31 NOTE — Anesthesia Preprocedure Evaluation (Signed)
Anesthesia Evaluation  Patient identified by MRN, date of birth, ID band Patient awake    Reviewed: Allergy & Precautions, NPO status , Patient's Chart, lab work & pertinent test results  Airway Mallampati: II  TM Distance: >3 FB Neck ROM: Full    Dental no notable dental hx. (+) Teeth Intact, Dental Advisory Given   Pulmonary pneumonia,    Pulmonary exam normal breath sounds clear to auscultation       Cardiovascular Exercise Tolerance: Good hypertension, Normal cardiovascular exam Rhythm:Regular Rate:Normal     Neuro/Psych negative neurological ROS     GI/Hepatic negative GI ROS, Neg liver ROS,   Endo/Other  negative endocrine ROS  Renal/GU      Musculoskeletal  (+) Arthritis ,   Abdominal   Peds  Hematology   Anesthesia Other Findings Hx of Breast CA  Reproductive/Obstetrics                            Anesthesia Physical Anesthesia Plan  ASA: III  Anesthesia Plan: MAC   Post-op Pain Management:    Induction:   PONV Risk Score and Plan: Treatment may vary due to age or medical condition  Airway Management Planned: Natural Airway and Nasal Cannula  Additional Equipment:   Intra-op Plan:   Post-operative Plan:   Informed Consent: I have reviewed the patients History and Physical, chart, labs and discussed the procedure including the risks, benefits and alternatives for the proposed anesthesia with the patient or authorized representative who has indicated his/her understanding and acceptance.     Dental advisory given  Plan Discussed with: CRNA  Anesthesia Plan Comments: (Colonoscopy for cecal polyp)       Anesthesia Quick Evaluation

## 2020-02-01 ENCOUNTER — Other Ambulatory Visit (HOSPITAL_COMMUNITY): Payer: Self-pay | Admitting: Gastroenterology

## 2020-02-01 ENCOUNTER — Ambulatory Visit (HOSPITAL_COMMUNITY): Payer: Medicaid Other | Admitting: Anesthesiology

## 2020-02-01 ENCOUNTER — Ambulatory Visit (HOSPITAL_COMMUNITY)
Admission: RE | Admit: 2020-02-01 | Discharge: 2020-02-01 | Disposition: A | Discharge status: Home / Self Care | Payer: Medicaid Other | Attending: Gastroenterology | Admitting: Gastroenterology

## 2020-02-01 ENCOUNTER — Ambulatory Visit (HOSPITAL_COMMUNITY): Payer: Medicaid Other

## 2020-02-01 ENCOUNTER — Encounter (HOSPITAL_COMMUNITY)
Admission: RE | Disposition: A | Discharge status: Home / Self Care | Payer: Self-pay | Source: Home / Self Care | Attending: Gastroenterology

## 2020-02-01 ENCOUNTER — Other Ambulatory Visit: Payer: Self-pay

## 2020-02-01 ENCOUNTER — Encounter (HOSPITAL_COMMUNITY): Payer: Self-pay | Admitting: Gastroenterology

## 2020-02-01 ENCOUNTER — Telehealth: Payer: Self-pay

## 2020-02-01 DIAGNOSIS — Z923 Personal history of irradiation: Secondary | ICD-10-CM | POA: Insufficient documentation

## 2020-02-01 DIAGNOSIS — Z86010 Personal history of colonic polyps: Secondary | ICD-10-CM | POA: Insufficient documentation

## 2020-02-01 DIAGNOSIS — Z803 Family history of malignant neoplasm of breast: Secondary | ICD-10-CM | POA: Diagnosis not present

## 2020-02-01 DIAGNOSIS — Z853 Personal history of malignant neoplasm of breast: Secondary | ICD-10-CM | POA: Insufficient documentation

## 2020-02-01 DIAGNOSIS — D122 Benign neoplasm of ascending colon: Secondary | ICD-10-CM | POA: Insufficient documentation

## 2020-02-01 DIAGNOSIS — Z801 Family history of malignant neoplasm of trachea, bronchus and lung: Secondary | ICD-10-CM | POA: Insufficient documentation

## 2020-02-01 DIAGNOSIS — K668 Other specified disorders of peritoneum: Secondary | ICD-10-CM

## 2020-02-01 DIAGNOSIS — K635 Polyp of colon: Secondary | ICD-10-CM

## 2020-02-01 DIAGNOSIS — Z800 Family history of malignant neoplasm of digestive organs: Secondary | ICD-10-CM

## 2020-02-01 DIAGNOSIS — Z1211 Encounter for screening for malignant neoplasm of colon: Secondary | ICD-10-CM

## 2020-02-01 HISTORY — PX: HEMOSTASIS CLIP PLACEMENT: SHX6857

## 2020-02-01 HISTORY — PX: COLONOSCOPY WITH PROPOFOL: SHX5780

## 2020-02-01 HISTORY — PX: ENDOSCOPIC MUCOSAL RESECTION: SHX6839

## 2020-02-01 HISTORY — PX: SUBMUCOSAL LIFTING INJECTION: SHX6855

## 2020-02-01 SURGERY — COLONOSCOPY WITH PROPOFOL
Anesthesia: Monitor Anesthesia Care

## 2020-02-01 MED ORDER — SODIUM CHLORIDE 0.9 % IV SOLN: INTRAVENOUS | Status: DC

## 2020-02-01 MED ORDER — CIPROFLOXACIN IN D5W 400 MG/200ML IV SOLN
INTRAVENOUS | Status: AC
  Filled 2020-02-01: qty 200

## 2020-02-01 MED ORDER — PHENYLEPHRINE HCL (PRESSORS) 10 MG/ML IV SOLN
INTRAVENOUS | Status: DC | PRN
  Administered 2020-02-01 (×3): 80 ug via INTRAVENOUS

## 2020-02-01 MED ORDER — CIPROFLOXACIN HCL 500 MG PO TABS: 500.0000 mg | ORAL_TABLET | Freq: Two times a day (BID) | ORAL | 0 refills | Status: DC

## 2020-02-01 MED ORDER — PROPOFOL 500 MG/50ML IV EMUL
INTRAVENOUS | Status: AC
  Filled 2020-02-01: qty 200

## 2020-02-01 MED ORDER — PROPOFOL 500 MG/50ML IV EMUL
INTRAVENOUS | Status: AC
  Filled 2020-02-01: qty 100

## 2020-02-01 MED ORDER — METRONIDAZOLE IN NACL 5-0.79 MG/ML-% IV SOLN
500.0000 mg | Freq: Once | INTRAVENOUS | Status: AC
  Administered 2020-02-01: 500 mg via INTRAVENOUS
  Filled 2020-02-01: qty 100

## 2020-02-01 MED ORDER — PROPOFOL 10 MG/ML IV BOLUS
INTRAVENOUS | Status: AC
  Filled 2020-02-01: qty 20

## 2020-02-01 MED ORDER — LACTATED RINGERS IV SOLN: INTRAVENOUS | Status: DC

## 2020-02-01 MED ORDER — PROPOFOL 1000 MG/100ML IV EMUL
INTRAVENOUS | Status: AC
  Filled 2020-02-01: qty 400

## 2020-02-01 MED ORDER — METRONIDAZOLE 500 MG PO TABS: 500.0000 mg | ORAL_TABLET | Freq: Two times a day (BID) | ORAL | 0 refills | Status: DC

## 2020-02-01 MED ORDER — PROPOFOL 500 MG/50ML IV EMUL
INTRAVENOUS | Status: DC | PRN
  Administered 2020-02-01: 175 ug/kg/min via INTRAVENOUS

## 2020-02-01 MED FILL — CIPROFLOXACIN HCL 500 MG TA: 500 | 5 days supply | Qty: 10 | Fill #0

## 2020-02-01 MED FILL — METRONIDAZOLE 500 MG TABS: 500 | 5 days supply | Qty: 10 | Fill #0

## 2020-02-01 SURGICAL SUPPLY — 22 items

## 2020-02-01 NOTE — Interval H&P Note (Signed)
History and Physical Interval Note:  02/01/2020 7:12 AM  [name]  has presented today for surgery, with the diagnosis of cecal polyp.  The various methods of treatment have been discussed with the patient and family. After consideration of risks, benefits and other options for treatment, the patient has consented to  Procedure(s): COLONOSCOPY WITH PROPOFOL (N/A) as a surgical intervention.  The patient's history has been reviewed, patient examined, no change in status, stable for surgery.  I have reviewed the patient's chart and labs.  Questions were answered to the patient's satisfaction.     Milus Banister

## 2020-02-01 NOTE — Progress Notes (Signed)
Pt does not have legal guardian; pt can make own decisions

## 2020-02-01 NOTE — Discharge Instructions (Signed)
YOU HAD AN ENDOSCOPIC PROCEDURE TODAY: Refer to the procedure report and other information in the discharge instructions given to you for any specific questions about what was found during the examination. If this information does not answer your questions, please call [facility] office at [phone] to clarify.  ° °YOU SHOULD EXPECT: Some feelings of bloating in the abdomen. Passage of more gas than usual. Walking can help get rid of the air that was put into your GI tract during the procedure and reduce the bloating. If you had a lower endoscopy (such as a colonoscopy or flexible sigmoidoscopy) you may notice spotting of blood in your stool or on the toilet paper. Some abdominal soreness may be present for a day or two, also. ° °DIET: Your first meal following the procedure should be a light meal and then it is ok to progress to your normal diet. A half-sandwich or bowl of soup is an example of a good first meal. Heavy or fried foods are harder to digest and may make you feel nauseous or bloated. Drink plenty of fluids but you should avoid alcoholic beverages for 24 hours. If you had a esophageal dilation, please see attached instructions for diet.   ° °ACTIVITY: Your care partner should take you home directly after the procedure. You should plan to take it easy, moving slowly for the rest of the day. You can resume normal activity the day after the procedure however YOU SHOULD NOT DRIVE, use power tools, machinery or perform tasks that involve climbing or major physical exertion for 24 hours (because of the sedation medicines used during the test).  ° °SYMPTOMS TO REPORT IMMEDIATELY: °A gastroenterologist can be reached at any hour. Please call [phone]  for any of the following symptoms:  °Following lower endoscopy (colonoscopy, flexible sigmoidoscopy) °Excessive amounts of blood in the stool  °Significant tenderness, worsening of abdominal pains  °Swelling of the abdomen that is new, acute  °Fever of 100° or  higher  °Following upper endoscopy (EGD, EUS, ERCP, esophageal dilation) °Vomiting of blood or coffee ground material  °New, significant abdominal pain  °New, significant chest pain or pain under the shoulder blades  °Painful or persistently difficult swallowing  °New shortness of breath  °Black, tarry-looking or red, bloody stools ° °FOLLOW UP:  °If any biopsies were taken you will be contacted by phone or by letter within the next 1-3 weeks. Call [phone]  if you have not heard about the biopsies in 3 weeks.  °Please also call with any specific questions about appointments or follow up tests. ° °

## 2020-02-01 NOTE — Transfer of Care (Signed)
Immediate Anesthesia Transfer of Care Note  Patient: [name]  Procedure(s) Performed: COLONOSCOPY WITH PROPOFOL (N/A ) SUBMUCOSAL LIFTING INJECTION HEMOSTASIS CLIP PLACEMENT ENDOSCOPIC MUCOSAL RESECTION  Patient Location: PACU  Anesthesia Type:MAC  Level of Consciousness: awake, alert  and oriented  Airway & Oxygen Therapy: Patient Spontanous Breathing and Patient connected to face mask oxygen  Post-op Assessment: Report given to RN and Post -op Vital signs reviewed and stable  Post vital signs: Reviewed and stable  Last Vitals:  Vitals Value Taken Time  BP 148/95 02/01/20 0830  Temp 36.7 C 02/01/20 0814  Pulse 90 02/01/20 0833  Resp 17 02/01/20 0833  SpO2 100 % 02/01/20 0833  Vitals shown include unvalidated device data.  Last Pain:  Vitals:   02/01/20 0814  TempSrc: Oral  PainSc: 0-No pain         Complications: No complications documented.

## 2020-02-01 NOTE — Anesthesia Postprocedure Evaluation (Signed)
Anesthesia Post Note  Patient: [name]  Procedure(s) Performed: COLONOSCOPY WITH PROPOFOL (N/A ) SUBMUCOSAL LIFTING INJECTION HEMOSTASIS CLIP PLACEMENT ENDOSCOPIC MUCOSAL RESECTION     Patient location during evaluation: Endoscopy Anesthesia Type: MAC Level of consciousness: awake and alert Pain management: pain level controlled Vital Signs Assessment: post-procedure vital signs reviewed and stable Respiratory status: spontaneous breathing, nonlabored ventilation, respiratory function stable and patient connected to nasal cannula oxygen Cardiovascular status: blood pressure returned to baseline and stable Postop Assessment: no apparent nausea or vomiting Anesthetic complications: no   No complications documented.  Last Vitals:  Vitals:   02/01/20 0905 02/01/20 0910  BP:  135/78  Pulse: 83 79  Resp: 16 19  Temp:    SpO2: 96% 97%    Last Pain:  Vitals:   02/01/20 0814  TempSrc: Oral  PainSc: 0-No pain                 Barnet Glasgow

## 2020-02-01 NOTE — Telephone Encounter (Signed)
Appt made for tomorrow on 02/02/20 at 950 am.  Left message for pt to return call.  Dr Ardis Hughs what is the reason for this appt?

## 2020-02-01 NOTE — Telephone Encounter (Signed)
She saw the appt on mychart already and will be there.  Follow up colonoscopy, polyp resection.  Thanks

## 2020-02-01 NOTE — Telephone Encounter (Signed)
-----   Message from Milus Banister, MD sent at 02/01/2020  8:23 AM EST ----- She needs OV with me tomorrow morning (double book if needed) or with extender tomorrow AM if available.    Thanks

## 2020-02-01 NOTE — Op Note (Signed)
[facility] Patient Name: [name] Procedure Date: 02/01/2020 MRN: [id number] Attending MD: Milus Banister , MD Date of Birth: 1970/09/23 CSN: [id number] Age: 50 Admit Type: Outpatient Procedure:                Colonoscopy Indications:              High risk colon cancer surveillance: Personal                            history of colonic polyps; Colonoscopy 12/2019                            found a 2.5cm sessile, centrally umbilicated polyp                            in ascending segment, adjacent to IC valve, TA on                            biopsies without HGD Providers:                Milus Banister, MD, Cleda Daub, RN, William Dalton, Technician, Nelia Shi, RN Referring MD:              Medicines:                Monitored Anesthesia Care, cipro 400mg  IV and                            flagyl 500mg  IV. Complications:            No immediate complications. Estimated blood loss:                            None. Estimated Blood Loss:     Estimated blood loss: none. Procedure:                Pre-Anesthesia Assessment:                           - Prior to the procedure, a History and Physical                            was performed, and patient medications and                            allergies were reviewed. The patient's tolerance of                            previous anesthesia was also reviewed. The risks                            and benefits of the procedure and the sedation                            options and  risks were discussed with the patient.                            All questions were answered, and informed consent                            was obtained. Prior Anticoagulants: The patient has                            taken no previous anticoagulant or antiplatelet                            agents. ASA Grade Assessment: II - A patient with                            mild systemic disease. After  reviewing the risks                            and benefits, the patient was deemed in                            satisfactory condition to undergo the procedure.                           After obtaining informed consent, the colonoscope                            was passed under direct vision. Throughout the                            procedure, the patient's blood pressure, pulse, and                            oxygen saturations were monitored continuously. The                            [id number]) Olympus colonoscope was                            introduced through the anus and advanced to the the                            cecum, identified by appendiceal orifice and                            ileocecal valve. The colonoscopy was performed                            without difficulty. The patient tolerated the                            procedure well. The quality of the bowel  preparation was good. The ileocecal valve,                            appendiceal orifice, and rectum were photographed. Scope In: 7:40:37 AM Scope Out: 8:03:04 AM Scope Withdrawal Time: 0 hours 18 minutes 52 seconds  Total Procedure Duration: 0 hours 22 minutes 27 seconds  Findings:      A 25 mm polyp was found in the proximal ascending colon. The polyp was       sessile and centrally umbilicated. The polyp was lifted with several       Orise submucosal injections and then resected using Sanre/cautery in a       piecemeal fashion (two sections). Resection and retrieval appeared       complete. I suspected a full thickness injury (perforation) at the site       following rection but the images were not clear, certainly no frank       'hole." To be safe, I closed the defect by approximating the tissue       edges with placement of 5 5mm Resolution 360 Ultra clips. Closure of       the defect appeared successful.      The exam was otherwise without abnormality on direct  and retroflexion       views. Impression:               - A 25 mm polyp was found in the proximal ascending                            colon. The polyp was sessile and centrally                            umbilicated. The polyp was lifted with several                            Orise submucosal injections and then resected using                            Sanre/cautery in a piecemeal fashion (two                            sections). Resection and retrieval appeared                            complete. I suspected a full thickness injury                            (perforation) at the site following rection but the                            images were not clear, certainly no frank 'hole."                            To be safe, I closed the defect by approximating                            the tissue edges with  placement of 5 4mm                            Resolution 360 Ultra clips. Closure of the defect                            appeared successful.                           - The examination was otherwise normal on direct                            and retroflexion views. Moderate Sedation:      Not Applicable - Patient had care per Anesthesia. Recommendation:           - Clear liquids for another 24 hours.                           - Complete 5 days of oral antibiotics (cipro and                            flagyl).                           - Office visit with Dr. Ardis Hughs tomorrow AM.                           - KUB flat and upright in recovery, will be OK to                            d/c home if no free air.                           - Continue present medications.                           - Await pathology results. Procedure Code(s):        --- Professional ---                           [id number], Colonoscopy, flexible; with removal of                            tumor(s), polyp(s), or other lesion(s) by snare                            technique Diagnosis Code(s):        ---  Professional ---                           Z86.010, Personal history of colonic polyps                           K63.5, Polyp of colon CPT copyright 2019 [employer]. All rights reserved. The codes documented in this report are preliminary and upon coder review may  be revised to meet  current compliance requirements. Milus Banister, MD 02/01/2020 8:17:53 AM This report has been signed electronically. Number of Addenda: 0

## 2020-02-01 NOTE — Telephone Encounter (Signed)
Noted.  CMA notified of the add on for tomorrow.

## 2020-02-02 ENCOUNTER — Ambulatory Visit: Payer: Medicaid Other | Admitting: Gastroenterology

## 2020-02-02 ENCOUNTER — Encounter: Payer: Self-pay | Admitting: Gastroenterology

## 2020-02-02 VITALS — BP 112/80 | HR 79 | Ht 67.5 in | Wt 268.2 lb

## 2020-02-02 DIAGNOSIS — Z86010 Personal history of colonic polyps: Secondary | ICD-10-CM | POA: Diagnosis not present

## 2020-02-02 LAB — SURGICAL PATHOLOGY

## 2020-02-02 NOTE — Patient Instructions (Addendum)
If you are age 51 or younger, your body mass index should be between 19-25. Your Body mass index is 41.39 kg/m. If this is out of the aformentioned range listed, please consider follow up with your Primary Care Provider.   You will be seen in follow up on an as needed basis based on your pathology report.  Thank you for entrusting me with your care and choosing [facility].  Dr Ardis Hughs

## 2020-02-02 NOTE — Progress Notes (Signed)
Review of pertinent gastrointestinal problems: 1.  Colonoscopy December 2021 Dr. Ardis Hughs 2.5 cm sessile central umbilicated polyp in the ascending colon, directly adjacent to the IC valve, this was a tubular adenoma biopsies without high-grade dysplasia.  Repeat colonoscopy February 01, 2020 with endoscopic mucosal resection of the polyp, there was concern for perforation at the time of the procedure and so 5 endoclips were placed, she was put on empiric antibiotics, KUB in recovery showed no sign of free air.   HPI: This is a very pleasant 50 year old woman.  She is here with her husband today.  I saw her yesterday the time of a colonoscopy.  See those results and issues above.  She had some slight right lower quadrant discomfort after the procedure, it never worsened.  She went to sleep last night, got a very good night sleep, passed gas and awoke feeling absolutely fine.  No fevers or chills.  She had a liquid only diet.  She ambulates without any discomforts.  She did have expulsion of some dark prep mixed with stool this morning.   ROS: complete GI ROS as described in HPI, all other review negative.  Constitutional:  No unintentional weight loss   Past Medical History:  Diagnosis Date  . Arthritis   . Breast cancer ([place])    Left Breast Cancer  . Breast cancer ([place]) 11/24/2019   left   . Complication of anesthesia    DAY SURGERY 2010 OR 2011 ASPIRATED AND STAYED OVERNIGHT  . History of radiation therapy 03/11/17- 04/21/17   Left breast, 2 Gy in 25 fractions for a total dose of 50 Gy, Boost, 2 Gy in 5 fractions for a total dose of 10 Gy  . Hypertension    OFF MEDS  SINCE CHEMO X 5 MONTHS  . Malignant neoplasm of upper-outer quadrant of left breast in female, estrogen receptor negative ([place]) 06/23/2016  . Personal history of chemotherapy 2019   Left Breast Cancer  07-2016-12-2016  . Personal history of radiation therapy 2019   Left Breast Cancer    Past Surgical History:  Procedure  Laterality Date  . ANKLE SURGERY    . BREAST LUMPECTOMY Left 01/27/2017  . BREAST LUMPECTOMY WITH RADIOACTIVE SEED AND SENTINEL LYMPH NODE BIOPSY Left 01/27/2017   Procedure: LEFT BREAST LUMPECTOMY WITH RADIOACTIVE SEED LOCALIZATION, LEFT AXILLARY DEEP LYMPH NODE BIOPSY WITH RADIOACTIVE SEED LOCALIZATION, LEFT AXILLARY SENTINEL LYMPH NODE MAPPING AND BIOPSY WITH BLUE DYE INJECTION;  Surgeon: [name], MD;  Location: [facility];  Service: General;  Laterality: Left;  . IR FLUORO GUIDE PORT INSERTION RIGHT  07/14/2016  . IR US GUIDE VASC ACCESS RIGHT  07/14/2016  . MASTECTOMY W/ SENTINEL NODE BIOPSY Bilateral 01/09/2020   Procedure: BILATERAL MASTECTOMY WITH LEFT AXILLARY SENTINEL LYMPH NODE BIOPSY;  Surgeon: Rolm Bookbinder, MD;  Location: [place];  Service: General;  Laterality: Bilateral;  . PORT-A-CATH REMOVAL Right 01/27/2017   Procedure: REMOVAL PORT-A-CATH;  Surgeon: [name], MD;  Location: [place];  Service: General;  Laterality: Right;  . PORTACATH PLACEMENT Right 01/09/2020   Procedure: INSERTION PORT-A-CATH;  Surgeon: Rolm Bookbinder, MD;  Location: [place];  Service: General;  Laterality: Right;  . TUBAL LIGATION    . WISDOM TOOTH EXTRACTION      Current Outpatient Medications  Medication Sig Dispense Refill  . ciprofloxacin (CIPRO) 500 MG tablet Take 1 tablet (500 mg total) by mouth 2 (two) times daily. 10 tablet 0  . gabapentin (NEURONTIN) 100 MG capsule Take  200 mg by mouth 2 (two) times daily.    Marland Kitchen gabapentin (NEURONTIN) 300 MG capsule TAKE 2 CAPSULES (600 MG TOTAL) BY MOUTH AT BEDTIME. 90 capsule 4  . metroNIDAZOLE (FLAGYL) 500 MG tablet Take 1 tablet (500 mg total) by mouth 2 (two) times daily for 5 days. 10 tablet 0  . senna (SENOKOT) 8.6 MG tablet Take 2 tablets by mouth daily.    Marland Kitchen acetaminophen (TYLENOL) 325 MG tablet Take 650 mg by mouth 3 (three) times daily. (Patient not taking: Reported on 02/02/2020)    . lidocaine-prilocaine  (EMLA) cream Apply to affected area once (Patient not taking: Reported on 02/02/2020) 30 g 3  . methocarbamol (ROBAXIN) 500 MG tablet Take 1 tablet (500 mg total) by mouth every 6 (six) hours as needed for muscle spasms. (Patient not taking: Reported on 02/02/2020) 30 tablet 1  . naproxen sodium (ALEVE) 220 MG tablet Take 220 mg by mouth 3 (three) times daily. (Patient not taking: Reported on 02/02/2020)    . prochlorperazine (COMPAZINE) 10 MG tablet Take 1 tablet (10 mg total) by mouth every 6 (six) hours as needed (Nausea or vomiting). (Patient not taking: Reported on 02/02/2020) 30 tablet 1  . traMADol (ULTRAM) 50 MG tablet Take 1-2 tablets (50-100 mg total) by mouth every 6 (six) hours as needed. (Patient not taking: Reported on 02/02/2020) 60 tablet 0   No current facility-administered medications for this visit.    Allergies as of 02/02/2020 - Review Complete 02/02/2020  Allergen Reaction Noted  . Carboplatin Other (See Comments) 01/20/2017    Family History  Problem Relation Age of Onset  . Diabetes Mother   . Hypertension Mother   . Arthritis Mother   . Colon cancer Father 53       died .76 metastatic at time of diagnosis  . Diabetes Father   . Heart disease Father   . Hypertension Father   . Breast cancer Maternal Grandmother 11       d.60s  . Breast cancer Maternal Aunt 52  . Cervical cancer Maternal Aunt 56  . Breast cancer Maternal Aunt 46       d.50s  . Cancer Maternal Uncle        d.62s unspecified type of cancer  . Breast cancer Cousin 21       paternal first-cousin (daughter of unaffected aunt)  . Cancer Maternal Aunt 40       "Female Cancer"  . Cancer Maternal Aunt        unknown cancer  . Brain cancer Daughter 2  . Esophageal cancer Other   . Colon polyps Neg Hx   . Rectal cancer Neg Hx   . Stomach cancer Neg Hx     Social History   Socioeconomic History  . Marital status: Married    Spouse name: Not on file  . Number of children: Not on file  . Years  of education: Not on file  . Highest education level: Not on file  Occupational History  . Not on file  Tobacco Use  . Smoking status: Never Smoker  . Smokeless tobacco: Never Used  Vaping Use  . Vaping Use: Never used  Substance and Sexual Activity  . Alcohol use: No  . Drug use: No  . Sexual activity: Not Currently  Other Topics Concern  . Not on file  Social History Narrative  . Not on file   Social Determinants of Health   Financial Resource Strain: Not on file  Food Insecurity:  Not on file  Transportation Needs: Not on file  Physical Activity: Not on file  Stress: Not on file  Social Connections: Not on file  Intimate Partner Violence: Not on file     Physical Exam: BP 112/80   Pulse 79   Ht 5' 7.5" (1.715 m)   Wt 268 lb 3.2 oz (121.7 kg)   LMP  (LMP Unknown)   SpO2 97%   BMI 41.39 kg/m  Constitutional: generally well-appearing Psychiatric: alert and oriented x3 Abdomen: soft, very mildly tender right lower quadrant with deep palpation only, nondistended, no obvious ascites, no peritoneal signs, normal bowel sounds No peripheral edema noted in lower extremities  Assessment and plan: 50 y.o. female status post colonoscopy yesterday with endoscopic mucosal resection and at least post polypectomy syndrome if not contained perforation  She is doing very well clinically following the colonoscopy.  I am not concerned about peritonitis certainly.  She will complete her oral antibiotics of Cipro and Flagyl for complete 5-day course.  She is okay to start eating regular diet today although I did advise her to have a relatively small meal for her first meal.  She knows to call our office anytime if she has worsening abdominal pains or unexplained fevers or any other GI symptoms that concern her.  Await pathology of the resected polyp to determine proper care from here  Please see the "Patient Instructions" section for addition details about the plan.  Owens Loffler,  MD [facility] 02/02/2020, 10:31 AM   Total time on date of encounter was 20 minutes (this included time spent preparing to see the patient reviewing records; obtaining and/or reviewing separately obtained history; performing a medically appropriate exam and/or evaluation; counseling and educating the patient and family if present; ordering medications, tests or procedures if applicable; and documenting clinical information in the health record).

## 2020-02-05 ENCOUNTER — Other Ambulatory Visit: Payer: Self-pay

## 2020-02-05 ENCOUNTER — Inpatient Hospital Stay: Payer: Medicaid Other

## 2020-02-05 NOTE — Progress Notes (Signed)
Pharmacist Chemotherapy Monitoring - Initial Assessment    Anticipated start date: 02/12/20   Regimen:  . Are orders appropriate based on the patient's diagnosis, regimen, and cycle? Yes . Does the plan date match the patient's scheduled date? Yes . Is the sequencing of drugs appropriate? Yes . Are the premedications appropriate for the patient's regimen? Yes . Prior Authorization for treatment is: Approved o If applicable, is the correct biosimilar selected based on the patient's insurance? not applicable  Organ Function and Labs: Marland Kitchen Are dose adjustments needed based on the patient's renal function, hepatic function, or hematologic function? No . Are appropriate labs ordered prior to the start of patient's treatment? Yes . Other organ system assessment, if indicated: N/A . The following baseline labs, if indicated, have been ordered: N/A  Dose Assessment: . Are the drug doses appropriate? Yes . Are the following correct: o Drug concentrations Yes o IV fluid compatible with drug Yes o Administration routes Yes o Timing of therapy Yes . If applicable, does the patient have documented access for treatment and/or plans for port-a-cath placement? yes . If applicable, have lifetime cumulative doses been properly documented and assessed? yes Lifetime Dose Tracking  . Doxorubicin: 234.896 mg/m2 (552 mg) = 52.2 % of the maximum lifetime dose of 450 mg/m2  . Carboplatin: 2,400 mg = 0.01 % of the maximum lifetime dose of 999,999,999 mg  o   Toxicity Monitoring/Prevention: . The patient has the following take home antiemetics prescribed: Prochlorperazine . The patient has the following take home medications prescribed: N/A . Medication allergies and previous infusion related reactions, if applicable, have been reviewed and addressed. Yes . The patient's current medication list has been assessed for drug-drug interactions with their chemotherapy regimen. no significant drug-drug interactions  were identified on review.  Order Review: . Are the treatment plan orders signed? Yes . Is the patient scheduled to see a provider prior to their treatment? No  I verify that I have reviewed each item in the above checklist and answered each question accordingly.  @RPHNAME @

## 2020-02-06 ENCOUNTER — Encounter: Payer: Self-pay | Admitting: Rehabilitation

## 2020-02-06 ENCOUNTER — Ambulatory Visit: Payer: Medicaid Other | Attending: General Surgery | Admitting: Rehabilitation

## 2020-02-06 DIAGNOSIS — M6281 Muscle weakness (generalized): Secondary | ICD-10-CM | POA: Insufficient documentation

## 2020-02-06 DIAGNOSIS — Z483 Aftercare following surgery for neoplasm: Secondary | ICD-10-CM | POA: Insufficient documentation

## 2020-02-06 DIAGNOSIS — R293 Abnormal posture: Secondary | ICD-10-CM

## 2020-02-06 DIAGNOSIS — I890 Lymphedema, not elsewhere classified: Secondary | ICD-10-CM | POA: Diagnosis present

## 2020-02-06 DIAGNOSIS — Z171 Estrogen receptor negative status [ER-]: Secondary | ICD-10-CM | POA: Insufficient documentation

## 2020-02-06 DIAGNOSIS — C50412 Malignant neoplasm of upper-outer quadrant of left female breast: Secondary | ICD-10-CM | POA: Insufficient documentation

## 2020-02-06 DIAGNOSIS — M25611 Stiffness of right shoulder, not elsewhere classified: Secondary | ICD-10-CM | POA: Diagnosis present

## 2020-02-06 DIAGNOSIS — N644 Mastodynia: Secondary | ICD-10-CM | POA: Diagnosis present

## 2020-02-06 DIAGNOSIS — M25612 Stiffness of left shoulder, not elsewhere classified: Secondary | ICD-10-CM | POA: Diagnosis present

## 2020-02-06 NOTE — Therapy (Signed)
[place], [place], [address] Phone: [phone]   Fax:  [phone]  Physical Therapy Treatment  Patient Details  Name: [name] MRN: [id number] Date of Birth: 1970/11/24 Referring Provider (PT): [name]   Encounter Date: 02/06/2020   PT End of Session - 02/06/20 1551    Visit Number 2    Number of Visits 9    Date for PT Re-Evaluation 03/12/20    Authorization Type [employer] blue    Authorization - Visit Number 2    Authorization - Number of Visits 27    PT Start Time 1500    PT Stop Time 1546    PT Time Calculation (min) 46 min    Activity Tolerance Patient tolerated treatment well    Behavior During Therapy [facility] for tasks assessed/performed           Past Medical History:  Diagnosis Date  . Arthritis   . Breast cancer ([place])    Left Breast Cancer  . Breast cancer ([place]) 11/24/2019   left   . Complication of anesthesia    DAY SURGERY 2010 OR 2011 ASPIRATED AND STAYED OVERNIGHT  . History of radiation therapy 03/11/17- 04/21/17   Left breast, 2 Gy in 25 fractions for a total dose of 50 Gy, Boost, 2 Gy in 5 fractions for a total dose of 10 Gy  . Hypertension    OFF MEDS  SINCE CHEMO X 5 MONTHS  . Malignant neoplasm of upper-outer quadrant of left breast in female, estrogen receptor negative ([place]) 06/23/2016  . Personal history of chemotherapy 2019   Left Breast Cancer  07-2016-12-2016  . Personal history of radiation therapy 2019   Left Breast Cancer    Past Surgical History:  Procedure Laterality Date  . ANKLE SURGERY    . BREAST LUMPECTOMY Left 01/27/2017  . BREAST LUMPECTOMY WITH RADIOACTIVE SEED AND SENTINEL LYMPH NODE BIOPSY Left 01/27/2017   Procedure: LEFT BREAST LUMPECTOMY WITH RADIOACTIVE SEED LOCALIZATION, LEFT AXILLARY DEEP LYMPH NODE BIOPSY WITH RADIOACTIVE SEED LOCALIZATION, LEFT AXILLARY SENTINEL LYMPH NODE MAPPING AND BIOPSY WITH BLUE DYE INJECTION;   Surgeon: [name], MD;  Location: [place];  Service: General;  Laterality: Left;  . COLONOSCOPY WITH PROPOFOL N/A 02/01/2020   Procedure: COLONOSCOPY WITH PROPOFOL;  Surgeon: Milus Banister, MD;  [facility];  Service: Endoscopy;  Laterality: N/A;  . ENDOSCOPIC MUCOSAL RESECTION  02/01/2020   Procedure: ENDOSCOPIC MUCOSAL RESECTION;  Surgeon: Milus Banister, MD;  [facility];  Service: Endoscopy;;  . HEMOSTASIS CLIP PLACEMENT  02/01/2020   Procedure: HEMOSTASIS CLIP PLACEMENT;  Surgeon: Milus Banister, MD;  [facility];  Service: Endoscopy;;  . IR FLUORO GUIDE PORT INSERTION RIGHT  07/14/2016  . IR US GUIDE VASC ACCESS RIGHT  07/14/2016  . MASTECTOMY W/ SENTINEL NODE BIOPSY Bilateral 01/09/2020   Procedure: BILATERAL MASTECTOMY WITH LEFT AXILLARY SENTINEL LYMPH NODE BIOPSY;  Surgeon: Rolm Bookbinder, MD;  Location: [place];  Service: General;  Laterality: Bilateral;  . PORT-A-CATH REMOVAL Right 01/27/2017   Procedure: REMOVAL PORT-A-CATH;  Surgeon: [name], MD;  Location: [place];  Service: General;  Laterality: Right;  . PORTACATH PLACEMENT Right 01/09/2020   Procedure: INSERTION PORT-A-CATH;  Surgeon: Rolm Bookbinder, MD;  Location: [place];  Service: General;  Laterality: Right;  . SUBMUCOSAL LIFTING INJECTION  02/01/2020   Procedure: SUBMUCOSAL LIFTING INJECTION;  Surgeon: Milus Banister, MD;  [facility];  Service:  Endoscopy;;  . TUBAL LIGATION    . WISDOM TOOTH EXTRACTION      There were no vitals filed for this visit.   Subjective Assessment - 02/06/20 1505    Subjective I can move more.  [name] said that left side was normal from radiation.    Pertinent History 2019:left lumpectomy with 5 nodes removed with radiation and chemotherapy, then recurrence on the left with bilateral mastectomy on 01/09/20 with [name] with no additional lymph nodes removed.    Currently in Pain? Yes     Pain Score 3     Pain Location Chest    Pain Orientation Right    Pain Descriptors / Indicators Aching;Tender    Pain Onset More than a month ago    Pain Frequency Constant              [facility] PT Assessment - 02/06/20 0001      AROM   AROM Assessment Site Shoulder    Right/Left Shoulder Right;Left    Right Shoulder Extension 130 Degrees    Right Shoulder ABduction 142 Degrees    Left Shoulder Flexion 153 Degrees    Left Shoulder ABduction 153 Degrees                         [facility] Adult PT Treatment/Exercise - 02/06/20 0001      Manual Therapy   Manual Therapy Manual Lymphatic Drainage (MLD);Passive ROM;Soft tissue mobilization    Soft tissue mobilization in seated to the Rt rhomboids, latissimus,    Manual Lymphatic Drainage (MLD) in supine: short neck, 5 deep breaths, Right axillary and inguinal andoes and then focus on Rt chest quadrant towards axillary or inguinal regions. Then some work seated to the Rt lateral trunk    Passive ROM to bil shoulder watching left wound for any pulling into flexion, abduction, ER                       PT Long Term Goals - 01/30/20 1720      PT LONG TERM GOAL #1   Title Pt will improve bil shoulder AROM to WNL for increased functional mobility    Time 8    Period Weeks    Status New      PT LONG TERM GOAL #2   Title Pt will report pain in the Rt lateral trunk as improved by at least 75%    Time 6    Period Weeks    Status New      PT LONG TERM GOAL #3   Title Pt will be able to sleep in any position in bed without increased pain    Time 6    Period Weeks    Status New      PT LONG TERM GOAL #4   Title Pt will be ind with final HEP for duration of chemotherapy    Time 6    Period Weeks    Status New                 Plan - 02/06/20 1551    Clinical Impression Statement Pt in much better spirits with less overall seated discomfort and more ability to move the Rt UE today compared to 1  week ago.  Able to take AROM measurements today for baseline.  Started PROM and MLD for the Rt lateral trunk tolerated well and reporting decreased discomfort post MT.    PT  Frequency 2x / week    PT Next Visit Plan (Lt chest is not infected just radiation delayed healing)  bil AAROM/PROM, MLD of the Rt lateral trunk teaching daughter as needed, postural awareness    PT Home Exercise Plan post op, and arm relaxation work    Oncologist with Plan of Care Patient           Patient will benefit from skilled therapeutic intervention in order to improve the following deficits and impairments:     Visit Diagnosis: Abnormal posture  Stiffness of left shoulder, not elsewhere classified  Stiffness of right shoulder, not elsewhere classified  Aftercare following surgery for neoplasm     Problem List Patient Active Problem List   Diagnosis Date Noted  . S/P bilateral mastectomy 01/09/2020  . Recurrent breast cancer, left ([place]) 12/16/2019  . Umbilical hernia without obstruction and without gangrene 11/07/2019  . BRCA2 gene mutation positive in female 10/16/2019  . Port-A-Cath in place 11/02/2016  . Cellulitis 10/24/2016  . Family history of breast cancer 10/08/2016  . Genetic testing 09/21/2016  . Sepsis ([place]) 08/27/2016  . Rash 08/27/2016  . Hypokalemia 08/27/2016  . Neutropenic fever ([place]) 08/26/2016  . Constipation 07/27/2016  . Malignant neoplasm of upper-outer quadrant of left breast in female, estrogen receptor negative ([place]) 06/23/2016  . Left breast mass 06/12/2016  . Essential hypertension 06/12/2016  . Class 3 severe obesity due to excess calories without serious comorbidity with body mass index (BMI) of 40.0 to 44.9 in adult ([place]) 06/12/2016  . Low vitamin D level 06/12/2016  . Irregular menses 06/12/2016    Stark Bray 02/06/2020, 3:53 PM  [place], [place], [address] Phone:  [phone]   Fax:  [phone]  Name: [name] MRN: [id number] Date of Birth: 07-20-1970

## 2020-02-08 ENCOUNTER — Ambulatory Visit: Payer: Medicaid Other

## 2020-02-08 ENCOUNTER — Other Ambulatory Visit: Payer: Self-pay

## 2020-02-08 DIAGNOSIS — R293 Abnormal posture: Secondary | ICD-10-CM

## 2020-02-08 DIAGNOSIS — C50412 Malignant neoplasm of upper-outer quadrant of left female breast: Secondary | ICD-10-CM

## 2020-02-08 DIAGNOSIS — Z483 Aftercare following surgery for neoplasm: Secondary | ICD-10-CM

## 2020-02-08 DIAGNOSIS — M25611 Stiffness of right shoulder, not elsewhere classified: Secondary | ICD-10-CM

## 2020-02-08 DIAGNOSIS — Z171 Estrogen receptor negative status [ER-]: Secondary | ICD-10-CM

## 2020-02-08 DIAGNOSIS — N644 Mastodynia: Secondary | ICD-10-CM

## 2020-02-08 DIAGNOSIS — I890 Lymphedema, not elsewhere classified: Secondary | ICD-10-CM

## 2020-02-08 DIAGNOSIS — M25612 Stiffness of left shoulder, not elsewhere classified: Secondary | ICD-10-CM

## 2020-02-08 DIAGNOSIS — M6281 Muscle weakness (generalized): Secondary | ICD-10-CM

## 2020-02-08 NOTE — Therapy (Signed)
[place], [place], [address] Phone: [phone]   Fax:  [phone]  Physical Therapy Treatment  Patient Details  Name: [name] MRN: [id number] Date of Birth: 06/22/1970 Referring Provider (PT): [name]   Encounter Date: 02/08/2020   PT End of Session - 02/08/20 1710    Visit Number 3    Number of Visits 9    Date for PT Re-Evaluation 03/12/20    Authorization Type [employer] blue    Authorization - Visit Number 3    Authorization - Number of Visits 27    PT Start Time 1608    PT Stop Time 1701    PT Time Calculation (min) 53 min    Activity Tolerance Patient tolerated treatment well    Behavior During Therapy [facility] for tasks assessed/performed           Past Medical History:  Diagnosis Date  . Arthritis   . Breast cancer ([place])    Left Breast Cancer  . Breast cancer ([place]) 11/24/2019   left   . Complication of anesthesia    DAY SURGERY 2010 OR 2011 ASPIRATED AND STAYED OVERNIGHT  . History of radiation therapy 03/11/17- 04/21/17   Left breast, 2 Gy in 25 fractions for a total dose of 50 Gy, Boost, 2 Gy in 5 fractions for a total dose of 10 Gy  . Hypertension    OFF MEDS  SINCE CHEMO X 5 MONTHS  . Malignant neoplasm of upper-outer quadrant of left breast in female, estrogen receptor negative ([place]) 06/23/2016  . Personal history of chemotherapy 2019   Left Breast Cancer  07-2016-12-2016  . Personal history of radiation therapy 2019   Left Breast Cancer    Past Surgical History:  Procedure Laterality Date  . ANKLE SURGERY    . BREAST LUMPECTOMY Left 01/27/2017  . BREAST LUMPECTOMY WITH RADIOACTIVE SEED AND SENTINEL LYMPH NODE BIOPSY Left 01/27/2017   Procedure: LEFT BREAST LUMPECTOMY WITH RADIOACTIVE SEED LOCALIZATION, LEFT AXILLARY DEEP LYMPH NODE BIOPSY WITH RADIOACTIVE SEED LOCALIZATION, LEFT AXILLARY SENTINEL LYMPH NODE MAPPING AND BIOPSY WITH BLUE DYE INJECTION;   Surgeon: [name], MD;  Location: [place];  Service: General;  Laterality: Left;  . COLONOSCOPY WITH PROPOFOL N/A 02/01/2020   Procedure: COLONOSCOPY WITH PROPOFOL;  Surgeon: Milus Banister, MD;  [facility];  Service: Endoscopy;  Laterality: N/A;  . ENDOSCOPIC MUCOSAL RESECTION  02/01/2020   Procedure: ENDOSCOPIC MUCOSAL RESECTION;  Surgeon: Milus Banister, MD;  [facility];  Service: Endoscopy;;  . HEMOSTASIS CLIP PLACEMENT  02/01/2020   Procedure: HEMOSTASIS CLIP PLACEMENT;  Surgeon: Milus Banister, MD;  [facility];  Service: Endoscopy;;  . IR FLUORO GUIDE PORT INSERTION RIGHT  07/14/2016  . IR US GUIDE VASC ACCESS RIGHT  07/14/2016  . MASTECTOMY W/ SENTINEL NODE BIOPSY Bilateral 01/09/2020   Procedure: BILATERAL MASTECTOMY WITH LEFT AXILLARY SENTINEL LYMPH NODE BIOPSY;  Surgeon: Rolm Bookbinder, MD;  Location: [place];  Service: General;  Laterality: Bilateral;  . PORT-A-CATH REMOVAL Right 01/27/2017   Procedure: REMOVAL PORT-A-CATH;  Surgeon: [name], MD;  Location: [place];  Service: General;  Laterality: Right;  . PORTACATH PLACEMENT Right 01/09/2020   Procedure: INSERTION PORT-A-CATH;  Surgeon: Rolm Bookbinder, MD;  Location: [place];  Service: General;  Laterality: Right;  . SUBMUCOSAL LIFTING INJECTION  02/01/2020   Procedure: SUBMUCOSAL LIFTING INJECTION;  Surgeon: Milus Banister, MD;  [facility];  Service:  Endoscopy;;  . TUBAL LIGATION    . WISDOM TOOTH EXTRACTION      There were no vitals filed for this visit.   Subjective Assessment - 02/08/20 1607    Subjective A little sore after last visit, but not too bad.  Put the compression bra on today and it helps me move a little better, but at times its still real painful. Left arm feels tighter under axilla,  right arm hurts more to move it. I start chemotherapy on Monday for 6 mos, every 3 weeks.    Pertinent History 2019:left lumpectomy  with 5 nodes removed with radiation and chemotherapy, then recurrence on the left with bilateral mastectomy on 01/09/20 with [name] with no additional lymph nodes removed.    Patient Stated Goals decrease the pain    Currently in Pain? Yes    Pain Score 5     Pain Location Axilla    Pain Orientation Right    Pain Descriptors / Indicators Aching;Sharp;Shooting;Burning    Pain Onset More than a month ago    Pain Frequency Constant                             [facility] Adult PT Treatment/Exercise - 02/08/20 0001      Exercises   Other Exercises  supine AA flexion, and stargazer x5   gently secondary to wounds     Manual Therapy   Manual Therapy Manual Lymphatic Drainage (MLD);Passive ROM    Manual Lymphatic Drainage (MLD) in supine: short neck, 5 deep breaths, Right axillary and inguinal LN's and then focus on Rt chest quadrant towards axillary or inguinal regions. Then some work seated to the Rt lateral trunk    Passive ROM to bil shoulder watching left wound for any pulling into flexion, abduction, ER                       PT Long Term Goals - 01/30/20 1720      PT LONG TERM GOAL #1   Title Pt will improve bil shoulder AROM to WNL for increased functional mobility    Time 8    Period Weeks    Status New      PT LONG TERM GOAL #2   Title Pt will report pain in the Rt lateral trunk as improved by at least 75%    Time 6    Period Weeks    Status New      PT LONG TERM GOAL #3   Title Pt will be able to sleep in any position in bed without increased pain    Time 6    Period Weeks    Status New      PT LONG TERM GOAL #4   Title Pt will be ind with final HEP for duration of chemotherapy    Time 6    Period Weeks    Status New                 Plan - 02/08/20 1710    Clinical Impression Statement Reviewed AAROM in supine for shoulder flexion and stargazer to gentle stretch only.  Continued bilateral shoulder PROM with occasional VC's  to relax arm.  Pt had good tolerance for PROM and noted improvements bilaterally.  Also continued MLD to Right axillary and Inguinal LN's and right upper quandrant in Supine and lateral trunk in sitting.  Incisions still covered and pt  using antibiotic cream on them, but with no increased pain with PROM    Comorbidities SLNB, history of radiation    Examination-Activity Limitations Bed Mobility;Reach Overhead;Caring for Others;Carry;Sleep;Dressing;Hygiene/Grooming    Examination-Participation Restrictions Cleaning;Meal Prep;Yard Work;Community Activity;Occupation;Driving;Laundry    Stability/Clinical Decision Making Evolving/Moderate complexity    Rehab Potential Excellent    PT Frequency 2x / week    PT Duration 6 weeks    PT Treatment/Interventions ADLs/Self Care Home Management;Therapeutic exercise;Therapeutic activities;Patient/family education;Manual lymph drainage;Manual techniques;Passive range of motion;Taping;Compression bandaging;Scar mobilization    PT Next Visit Plan (Lt chest is not infected just radiation delayed healing)  bil AAROM/PROM, MLD of the Rt lateral trunk teaching daughter as needed, postural awareness    PT Home Exercise Plan post op, and arm relaxation work    Oncologist with Plan of Care Patient           Patient will benefit from skilled therapeutic intervention in order to improve the following deficits and impairments:  Decreased range of motion,Decreased strength,Impaired UE functional use,Decreased scar mobility,Postural dysfunction,Pain,Decreased skin integrity  Visit Diagnosis: Abnormal posture  Stiffness of left shoulder, not elsewhere classified  Stiffness of right shoulder, not elsewhere classified  Aftercare following surgery for neoplasm  Carcinoma of upper-outer quadrant of left breast in female, estrogen receptor negative ([place])  Lymphedema, not elsewhere classified  Breast pain  Muscle weakness (generalized)     Problem  List Patient Active Problem List   Diagnosis Date Noted  . S/P bilateral mastectomy 01/09/2020  . Recurrent breast cancer, left ([place]) 12/16/2019  . Umbilical hernia without obstruction and without gangrene 11/07/2019  . BRCA2 gene mutation positive in female 10/16/2019  . Port-A-Cath in place 11/02/2016  . Cellulitis 10/24/2016  . Family history of breast cancer 10/08/2016  . Genetic testing 09/21/2016  . Sepsis ([place]) 08/27/2016  . Rash 08/27/2016  . Hypokalemia 08/27/2016  . Neutropenic fever ([place]) 08/26/2016  . Constipation 07/27/2016  . Malignant neoplasm of upper-outer quadrant of left breast in female, estrogen receptor negative ([place]) 06/23/2016  . Left breast mass 06/12/2016  . Essential hypertension 06/12/2016  . Class 3 severe obesity due to excess calories without serious comorbidity with body mass index (BMI) of 40.0 to 44.9 in adult ([place]) 06/12/2016  . Low vitamin D level 06/12/2016  . Irregular menses 06/12/2016    Claris Pong 02/08/2020, 5:20 PM  [place], [place], [address] Phone: [phone]   Fax:  [phone]  Name: Uma Jerde MRN: [id number] Date of Birth: 29-Jun-1970  Cheral Almas, PT 02/08/20 5:21 PM

## 2020-02-11 NOTE — Progress Notes (Signed)
[place]  Telephone:([phone] Fax:(336) [phone]     ID: Jenay Morici DOB: 27-May-1970  MR#: [id number]  GYJ#:[id number]  Patient Care Team: [name], MD as PCP - General (Internal Medicine) Kamau Weatherall, [name], MD as Consulting Physician (Oncology) [name], MD as Attending Physician (Radiation Oncology) Delice Bison Charlestine Massed, NP as Nurse Practitioner (Hematology and Oncology) Rolm Bookbinder, MD as Consulting Physician (General Surgery) Claudia Desanctis Steffanie Dunn, MD as Consulting Physician (Plastic Surgery) Mauro Kaufmann, RN as Oncology Nurse Navigator Rockwell Germany, RN as Oncology Nurse Navigator Milus Banister, MD as Attending Physician (Gastroenterology) OTHER MD:   CHIEF COMPLAINT: Triple negative breast cancer; BRCA2 positive  CURRENT TREATMENT: adjuvant chemotherapy (CMF)   INTERVAL HISTORY: [name] returns today for a follow-up and treatment of her triple negative breast cancer accompanied by her husband [name].   Since her last visit, she underwent colonoscopy on 02/01/2020 under Dr. Ardis Hughs. Pathology from the procedure [id number]) showed: tubulovillous adenoma. Recall recommended in 6 months.  She was scheduled to start adjuvant chemotherapy, consisting of cyclophosphamide, methotrexate, fluorouracil (CMF) given every 21 days x 8, today. However she is having some wound issues as described below.  Her bilateral salpingo-oophorectomy and hysterectomy has been postponed until after the above procedures have been completed.   REVIEW OF SYSTEMS: Nikeisha tells me she has significant pain associated with the right sided surgery.  She is going to physical therapy for this.  She is taking Aleve plus Tylenol 3 times a day, tramadol up to 4 times a day and also using gabapentin.  Because she uses gabapentin during the day she thinks the nighttime gabapentin is not working as well so she is having problems with insomnia.  On the left  side there has been some wound dehiscence.  She tells me there has been leakage of yellowish fluid.  There is no pain on that side.  She has had no fever.  She has completed the antibiotics of which were given to her by Dr. Ardis Hughs after her colonoscopy (Cipro and Flagyl for 1 week).  She is having a rough week emotionally.  She has no appetite.  A detailed review of systems today was otherwise stable   COVID 19 VACCINATION STATUS: not vaccinated (as of 02/12/2020)   BREAST CANCER HISTORY: From the original intake note:  The patient was seen in the emergency room 05/16/2016 with nonspecific complaints but also noting that she had a tender lump in her lateral left breast which she said she had noted the day before. Exam by the emergency room physician confirmed a 3 cm soft left lateral breast mass without overlying erythema or nipple changes. This was felt to be most consistent with fibrocystic change but the patient was referred back to her primary care physician for further evaluation. She saw the physician assistant in May 18 and then Dr. Wynetta Emery on 06/12/2016 who scheduled a bilateral diagnostic mammography with tomography and left breast ultrasonography at the [facility] 06/18/2016. This found the breast density to be category B. In the left breast upper outer quadrant there was an area of asymmetry measuring up to 10.3 cm. On exam this was firm and palpable and on ultrasound there was an irregular mass measuring 2.5 cm with some subtle changes in the surrounding tissue. In the left lower axilla there were 2 suspicious-looking lymph nodes.  Biopsy of the left breast upper outer quadrant and one of the suspicious lymph nodes 06/19/2016 showed ([id number]) both to be involved  by invasive ductal carcinoma, grade 3, estrogen and progesterone receptor negative, HER-2 not amplified with a signals ratio being 1.53-1.74 and the number per cell 2.35-3.05. The MIB-1 was 90%.  The patient's subsequent  history is as detailed below.   PAST MEDICAL HISTORY: Past Medical History:  Diagnosis Date  . Arthritis   . Breast cancer ([place])    Left Breast Cancer  . Breast cancer ([place]) 11/24/2019   left   . Complication of anesthesia    DAY SURGERY 2010 OR 2011 ASPIRATED AND STAYED OVERNIGHT  . History of radiation therapy 03/11/17- 04/21/17   Left breast, 2 Gy in 25 fractions for a total dose of 50 Gy, Boost, 2 Gy in 5 fractions for a total dose of 10 Gy  . Hypertension    OFF MEDS  SINCE CHEMO X 5 MONTHS  . Malignant neoplasm of upper-outer quadrant of left breast in female, estrogen receptor negative ([address]) 06/23/2016  . Personal history of chemotherapy 2019   Left Breast Cancer  07-2016-12-2016  . Personal history of radiation therapy 2019   Left Breast Cancer    PAST SURGICAL HISTORY: Past Surgical History:  Procedure Laterality Date  . ANKLE SURGERY    . BREAST LUMPECTOMY Left 01/27/2017  . BREAST LUMPECTOMY WITH RADIOACTIVE SEED AND SENTINEL LYMPH NODE BIOPSY Left 01/27/2017   Procedure: LEFT BREAST LUMPECTOMY WITH RADIOACTIVE SEED LOCALIZATION, LEFT AXILLARY DEEP LYMPH NODE BIOPSY WITH RADIOACTIVE SEED LOCALIZATION, LEFT AXILLARY SENTINEL LYMPH NODE MAPPING AND BIOPSY WITH BLUE DYE INJECTION;  Surgeon: [name], MD;  Location: [facility];  Service: General;  Laterality: Left;  . COLONOSCOPY WITH PROPOFOL N/A 02/01/2020   Procedure: COLONOSCOPY WITH PROPOFOL;  Surgeon: Milus Banister, MD;  Location: [facility];  Service: Endoscopy;  Laterality: N/A;  . ENDOSCOPIC MUCOSAL RESECTION  02/01/2020   Procedure: ENDOSCOPIC MUCOSAL RESECTION;  Surgeon: Milus Banister, MD;  Location: [facility];  Service: Endoscopy;;  . HEMOSTASIS CLIP PLACEMENT  02/01/2020   Procedure: HEMOSTASIS CLIP PLACEMENT;  Surgeon: Milus Banister, MD;  Location: [facility];  Service: Endoscopy;;  . IR FLUORO GUIDE PORT INSERTION RIGHT  07/14/2016  . IR US GUIDE VASC ACCESS RIGHT  07/14/2016  . MASTECTOMY W/  SENTINEL NODE BIOPSY Bilateral 01/09/2020   Procedure: BILATERAL MASTECTOMY WITH LEFT AXILLARY SENTINEL LYMPH NODE BIOPSY;  Surgeon: Rolm Bookbinder, MD;  Location: [facility];  Service: General;  Laterality: Bilateral;  . PORT-A-CATH REMOVAL Right 01/27/2017   Procedure: REMOVAL PORT-A-CATH;  Surgeon: [name], MD;  Location: [facility];  Service: General;  Laterality: Right;  . PORTACATH PLACEMENT Right 01/09/2020   Procedure: INSERTION PORT-A-CATH;  Surgeon: Rolm Bookbinder, MD;  Location: [facility];  Service: General;  Laterality: Right;  . SUBMUCOSAL LIFTING INJECTION  02/01/2020   Procedure: SUBMUCOSAL LIFTING INJECTION;  Surgeon: Milus Banister, MD;  [facility];  Service: Endoscopy;;  . TUBAL LIGATION    . WISDOM TOOTH EXTRACTION      FAMILY HISTORY Family History  Problem Relation Age of Onset  . Diabetes Mother   . Hypertension Mother   . Arthritis Mother   . Colon cancer Father 78       died .76 metastatic at time of diagnosis  . Diabetes Father   . Heart disease Father   . Hypertension Father   . Breast cancer Maternal Grandmother 94       d.60s  . Breast cancer Maternal Aunt 52  . Cervical cancer Maternal Aunt 109  .  Breast cancer Maternal Aunt 46       d.50s  . Cancer Maternal Uncle        d.62s unspecified type of cancer  . Breast cancer Cousin 19       paternal first-cousin (daughter of unaffected aunt)  . Cancer Maternal Aunt 55       "Female Cancer"  . Cancer Maternal Aunt        unknown cancer  . Brain cancer Daughter 22  . Esophageal cancer Other   . Colon polyps Neg Hx   . Rectal cancer Neg Hx   . Stomach cancer Neg Hx   The patient's father died at age 48 from colon cancer which she never had treated. The patient's mother is living, 31 years old as of June 2018. The patient has one brother, one sister. On the mother's side there is a history of breast cancer in the grandmother, age 63, and then 1 aunt with  breast cancer age 35, and another with lung cancer age 48. There is no history of ovarian cancer in the family.   GYNECOLOGIC HISTORY:  No LMP recorded (lmp unknown). Patient is postmenopausal. Menarche age 4, first live birth age 56. She is GX P4. Her periods have never been regular. She had a period in June 2018 but not for 4 months before that. She took birth control for some years remotely with no complications. She is status post bilateral tubal ligation.   SOCIAL HISTORY:  The patient's husband owns and runs a food truck.. They serve mostly [place] and [place] food. 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 Her husband Detria Cummings (goes by "[name]") is originally from [name]. The patient has two children from her first marriage. 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 scholarship, doing premed) and 77 as of October 2020    ADVANCED DIRECTIVES: In the absence of any documents to the contrary the patient's husband is her healthcare power of attorney   HEALTH MAINTENANCE: Social History   Tobacco Use  . Smoking status: Never Smoker  . Smokeless tobacco: Never Used  Vaping Use  . Vaping Use: Never used  Substance Use Topics  . Alcohol use: No  . Drug use: No     Colonoscopy: 02/01/2020  DXA:JOIN 2018  Bone density:Never   Allergies  Allergen Reactions  . Carboplatin Other (See Comments)    Red, blotchy, hot    Current Outpatient Medications  Medication Sig Dispense Refill  . acetaminophen (TYLENOL) 325 MG  tablet Take 650 mg by mouth 3 (three) times daily.    . ciprofloxacin (CIPRO) 500 MG tablet Take 1 tablet (500 mg total) by mouth 2 (two) times daily. 10 tablet 0  . gabapentin (NEURONTIN) 100 MG capsule Take 200 mg by mouth 2 (two) times daily.    Marland Kitchen gabapentin (NEURONTIN) 300 MG capsule TAKE 2 CAPSULES (600 MG TOTAL) BY MOUTH AT BEDTIME. 90 capsule 4  . lidocaine-prilocaine (EMLA) cream Apply to affected area once 30 g 3  . methocarbamol (ROBAXIN) 500  MG tablet Take 1 tablet (500 mg total) by mouth every 6 (six) hours as needed for muscle spasms. 30 tablet 1  . naproxen sodium (ALEVE) 220 MG tablet Take 220 mg by mouth 3 (three) times daily.    . prochlorperazine (COMPAZINE) 10 MG tablet Take 1 tablet (10 mg total) by mouth every 6 (six) hours as needed (Nausea or vomiting). 30 tablet 1  . senna (SENOKOT) 8.6 MG tablet Take 2 tablets by mouth daily.    . traMADol (ULTRAM) 50 MG tablet Take 1-2 tablets (50-100 mg total) by mouth every 6 (six) hours as needed. 60 tablet 0   No current facility-administered medications for this visit.    OBJECTIVE: White woman who appears stated age  50:   02/12/20 0833  BP: (!) 125/98  Pulse: 100  Resp: 18  Temp: 97.7 F (36.5 C)  SpO2: 97%     Body mass index is 40.17 kg/m.   Filed Weights   02/12/20 0833  Weight: 260 lb 4.8 oz (118.1 kg)   ECOG FS:1 - Symptomatic but completely ambulatory  Sclerae unicteric, EOMs intact Wearing a mask No cervical or supraclavicular adenopathy Lungs no rales or rhonchi Heart regular rate and rhythm Abd soft, nontender, positive bowel sounds MSK no focal spinal tenderness, no upper extremity lymphedema Neuro: nonfocal, well oriented, appropriate affect Breasts: Status post bilateral mastectomies.  On the right the incision is healing nicely.  There is a small eschar.  On the left there is some dehiscence as imaged below.      LAB RESULTS:  CMP     Component Value Date/Time   NA 139 12/28/2019  1322   NA 140 01/07/2017 1342   K 4.0 12/28/2019 1322   K 3.5 01/07/2017 1342   CL 102 12/28/2019 1322   CO2 29 12/28/2019 1322   CO2 27 01/07/2017 1342   GLUCOSE 140 (H) 12/28/2019 1322   GLUCOSE 148 (H) 01/07/2017 1342   BUN 10 12/28/2019 1322   BUN 6.2 (L) 01/07/2017 1342   CREATININE 0.79 12/28/2019 1322   CREATININE 0.7 01/07/2017 1342   CALCIUM 9.4 12/28/2019 1322   CALCIUM 9.4 01/07/2017 1342   PROT 7.4 12/28/2019 1322   PROT 7.0 01/07/2017 1342   ALBUMIN 3.7 12/28/2019 1322   ALBUMIN 3.7 01/07/2017 1342   AST 20 12/28/2019 1322   AST 23 01/07/2017 1342   ALT 19 12/28/2019 1322   ALT 24 01/07/2017 1342   ALKPHOS 96 12/28/2019 1322   ALKPHOS 105 01/07/2017 1342   BILITOT 0.9 12/28/2019 1322   BILITOT 0.84 01/07/2017 1342   GFRNONAA >60 12/28/2019 1322   GFRAA >60 05/23/2019 1921   GFRAA >60 10/13/2018 1346    No results found for: TOTALPROTELP, ALBUMINELP, A1GS, A2GS, BETS, BETA2SER, GAMS, MSPIKE, SPEI  No results found for: Nils Pyle, [facility]  Lab Results  Component Value Date   WBC 5.5 02/12/2020   NEUTROABS 2.7 02/12/2020   HGB 12.6 02/12/2020   HCT 38.3 02/12/2020   MCV 89.3 02/12/2020   PLT 215 02/12/2020      Chemistry      Component Value Date/Time   NA 139 12/28/2019 1322   NA 140 01/07/2017 1342   K 4.0 12/28/2019 1322   K 3.5 01/07/2017 1342   CL 102 12/28/2019 1322   CO2 29 12/28/2019 1322   CO2 27 01/07/2017 1342   BUN 10 12/28/2019 1322   BUN 6.2 (L) 01/07/2017 1342   CREATININE 0.79 12/28/2019 1322   CREATININE  0.7 01/07/2017 1342      Component Value Date/Time   CALCIUM 9.4 12/28/2019 1322   CALCIUM 9.4 01/07/2017 1342   ALKPHOS 96 12/28/2019 1322   ALKPHOS 105 01/07/2017 1342   AST 20 12/28/2019 1322   AST 23 01/07/2017 1342   ALT 19 12/28/2019 1322   ALT 24 01/07/2017 1342   BILITOT 0.9 12/28/2019 1322   BILITOT 0.84 01/07/2017 1342       No results found for: LABCA2  No components found for:  [id number]  No results for input(s): INR in the last 168 hours.  Urinalysis    Component Value Date/Time   COLORURINE YELLOW 12/05/2019 1154   APPEARANCEUR CLOUDY (A) 12/05/2019 1154   LABSPEC 1.024 12/05/2019 1154   LABSPEC 1.025 11/23/2016 1251   PHURINE 5.0 12/05/2019 1154   GLUCOSEU NEGATIVE 12/05/2019 1154   GLUCOSEU Negative 11/23/2016 1251   HGBUR SMALL (A) 12/05/2019 1154   BILIRUBINUR NEGATIVE 12/05/2019 1154   BILIRUBINUR Negative 11/23/2016 1251   KETONESUR NEGATIVE 12/05/2019 1154   PROTEINUR NEGATIVE 12/05/2019 1154   UROBILINOGEN 0.2 11/23/2016 1251   NITRITE NEGATIVE 12/05/2019 1154   LEUKOCYTESUR NEGATIVE 12/05/2019 1154   LEUKOCYTESUR Negative 11/23/2016 1251    STUDIES: DG Abd 2 Views  Result Date: 02/01/2020 CLINICAL DATA:  50 year old female status post colonoscopy this morning with polyp resection and mucosal clipping. Query pneumoperitoneum. EXAM: ABDOMEN - 2 VIEW COMPARISON:  CT Abdomen and Pelvis 01/18/2019. FINDINGS: Portable semi upright and supine views of the abdomen and pelvis. A cluster of surgical clips are demonstrated along the ascending colon right lateral abdomen. Paucity of bowel gas throughout the abdomen and pelvis, nonobstructed pattern. No pneumoperitoneum is identified. Both lung bases appear negative. No acute osseous abnormality identified. Incidental pelvic phleboliths. IMPRESSION: 1. No pneumoperitoneum identified. If suspicion persists a left side down decubitus portable radiograph or Abdomen CT would be most sensitive. 2. Clustered metal clips along the right colon. Normal bowel gas pattern. Electronically Signed   By: Genevie Ann M.D.   On: 02/01/2020 08:53     ELIGIBLE FOR AVAILABLE RESEARCH PROTOCOL: no   ASSESSMENT: 50 y.o.  woman status post left breast upper outer quadrant and left axillary lymph node biopsy 06/19/2016, both positive for a clinical T2-T3 N1, stage IIIB-C invasive ductal carcinoma, grade 3, triple negative,  with an MIB-1 of 90%  (1) genetics testing 08/04/2016 showed a variant of uncertain significance in the BRCA2 namely c.8169T>A (p.Asp2723Glu). 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 Additional testing of the patient's mother and maternal aunt is pending. Otherwise [facility]'s Common Hereditary Cancers Panel found no deleterious mutations in APC, ATM, AXIN2, BARD1, BMPR1A, BRCA1, BRCA2, BRIP1, CDH1, CDKN2A, CHEK2, CTNNA1, DICER1, EPCAM, GREM1, HOXB13, KIT, MEN1, MLH1, MSH2, MSH3, MSH6, MUTYH, NBN, NF1, NTHL1, PALB2, PDGFRA, PMS2, POLD1, POLE, PTEN, RAD50, RAD51C, RAD51D, SDHA, SDHB, SDHC, SDHD, SMAD4, SMARCA4, STK11, TP53, TSC1, TSC2, and VHL.  (2) neoadjuvant chemotherapy consisting of doxorubicin and cyclophosphamide in dose dense fashion 4 completed 09/10/2016 followed by paclitaxel weekly 12 given with carboplatin   (a) cycle 4 of cyclophosphamide and doxorubicin was delayed 10 days and dose decreased 10% because of febrile neutropenia after cycle 3  (b) cycle 6 of Paclitaxel and Carboplatin delayed due to neutropenia, therefore Granix added to Wednesday, Thursday, Friday following chemotherapy days  (3) status post left lumpectomy and left axillary sentinel lymph node sampling 01/27/2017 with pathology showing a complete pathologic response (ypT0 ypN0);   (a) 5  left axillary lymph nodes removed  (4) adjuvant radiation 03/11/2017-04/21/2017 Site/dose:   1. Left breast, 2 Gy in 25 fractions for a total dose of 50 Gy                      2. Boost, 2 Gy in 5 fractions for a total dose of 10 Gy  (5) BRCA2 positivity  (a) status post bilateral mastectomies January 2022  (b) referral to dermatology placed 10/16/2019 for melanoma screening  (c) referral to ophthalmology for yearly funduscopic exam  (d) referral to gynecologic oncology 10/16/2019 for BSO  (6) highly suspicious cecal polyp biopsy 12/06/2019  (a) partial colectomy planned January  2022  SECOND LEFT BREAST CANCER: (7) status post left breast biopsy 11/24/2019 for an invasive ductal carcinoma, grade 3, estrogen receptor 80% positive, with weak staining intensity, HER-2 negative, progesterone receptor negative, with an MIB-1 of 90%.  (8) status post bilateral mastectomies 01/09/2020 showing  (a) on the right, no evidence of malignancy  (b) on the left, a pT2 pNX, stage IIB invasive ductal carcinoma, grade 3, with ample margins   (c) repeat prognostic panel on the left again weakly estrogen receptor positive, HER2 [2+]negative, progesterone receptor negative  (9) adjuvant therapy anticipated with cyclophosphamide, methotrexate, fluorouracil (CMF) given every 21 days x 8 to start 02/12/2020  (10) olaparib, adjuvant estrogens to follow  (11) consider eventual treatment as per DESTINY trial  (12) foundation 1 requested on 01/09/2020 material   PLAN: Kelsi is very motivated to start with chemotherapy, but I think it would be prudent to give her a week so her surgeon can evaluate her and she can learn how to take care of the left sided wound while continuing physical therapy.  Waiting a week for chemo really will not make a difference to the ultimate outcome.  I do think she is overwhelmed emotionally.  This is only to be expected given all she has had to go through.  It is a form of posttraumatic stress and we discussed this extensively today.  I will start her on venlafaxine at 75 mg daily and we will increase the dose in a couple of weeks to the target dose of 150 mg daily.  I have alerted her surgeon to her left sided wound changes in case he is able to troubleshoot that with her this week.  She will see me again a week from today and we will start chemo at that point  Total encounter time 35 minutes.  Nadra Hritz, [name], MD  02/12/20 8:37 AM Medical Oncology and Hematology [facility] [address], [place] [address] Tel. [phone]    Fax.  [phone]   I, Wilburn Mylar, am acting as scribe for Dr. [name]. Kynnedi Zweig.  I, Lurline Del MD, have reviewed the above documentation for accuracy and completeness, and I agree with the above.   *Total Encounter Time as defined by the Centers for Medicare and Medicaid Services includes, in addition to the face-to-face time of a patient visit (documented in the note above) non-face-to-face time: obtaining and reviewing outside history, ordering and reviewing medications, tests or procedures, care coordination (communications with other health care professionals or caregivers) and documentation in the medical record.

## 2020-02-12 ENCOUNTER — Other Ambulatory Visit: Payer: Self-pay

## 2020-02-12 ENCOUNTER — Other Ambulatory Visit: Payer: Self-pay | Admitting: Oncology

## 2020-02-12 ENCOUNTER — Inpatient Hospital Stay: Payer: Medicaid Other | Attending: Oncology | Admitting: Oncology

## 2020-02-12 ENCOUNTER — Inpatient Hospital Stay: Payer: Medicaid Other

## 2020-02-12 ENCOUNTER — Encounter: Payer: Self-pay | Admitting: *Deleted

## 2020-02-12 VITALS — BP 125/98 | HR 100 | Temp 97.7°F | Resp 18 | Ht 67.5 in | Wt 260.3 lb

## 2020-02-12 DIAGNOSIS — Z170 Estrogen receptor positive status [ER+]: Secondary | ICD-10-CM | POA: Insufficient documentation

## 2020-02-12 DIAGNOSIS — Z1502 Genetic susceptibility to malignant neoplasm of ovary: Secondary | ICD-10-CM

## 2020-02-12 DIAGNOSIS — Z1509 Genetic susceptibility to other malignant neoplasm: Secondary | ICD-10-CM

## 2020-02-12 DIAGNOSIS — Z9013 Acquired absence of bilateral breasts and nipples: Secondary | ICD-10-CM | POA: Diagnosis not present

## 2020-02-12 DIAGNOSIS — Z95828 Presence of other vascular implants and grafts: Secondary | ICD-10-CM

## 2020-02-12 DIAGNOSIS — Z923 Personal history of irradiation: Secondary | ICD-10-CM | POA: Diagnosis not present

## 2020-02-12 DIAGNOSIS — C50412 Malignant neoplasm of upper-outer quadrant of left female breast: Secondary | ICD-10-CM

## 2020-02-12 DIAGNOSIS — I10 Essential (primary) hypertension: Secondary | ICD-10-CM | POA: Insufficient documentation

## 2020-02-12 DIAGNOSIS — D122 Benign neoplasm of ascending colon: Secondary | ICD-10-CM | POA: Diagnosis not present

## 2020-02-12 DIAGNOSIS — C50912 Malignant neoplasm of unspecified site of left female breast: Secondary | ICD-10-CM | POA: Diagnosis present

## 2020-02-12 DIAGNOSIS — Z1501 Genetic susceptibility to malignant neoplasm of breast: Secondary | ICD-10-CM

## 2020-02-12 DIAGNOSIS — Z79899 Other long term (current) drug therapy: Secondary | ICD-10-CM | POA: Insufficient documentation

## 2020-02-12 DIAGNOSIS — Z171 Estrogen receptor negative status [ER-]: Secondary | ICD-10-CM

## 2020-02-12 DIAGNOSIS — K429 Umbilical hernia without obstruction or gangrene: Secondary | ICD-10-CM

## 2020-02-12 DIAGNOSIS — Z5111 Encounter for antineoplastic chemotherapy: Secondary | ICD-10-CM | POA: Diagnosis not present

## 2020-02-12 LAB — COMPREHENSIVE METABOLIC PANEL
ALT: 23 U/L (ref 0–44)
AST: 27 U/L (ref 15–41)
Albumin: 4 g/dL (ref 3.5–5.0)
Alkaline Phosphatase: 92 U/L (ref 38–126)
Anion gap: 11 (ref 5–15)
BUN: 8 mg/dL (ref 6–20)
CO2: 29 mmol/L (ref 22–32)
Calcium: 9 mg/dL (ref 8.9–10.3)
Chloride: 100 mmol/L (ref 98–111)
Creatinine, Ser: 0.82 mg/dL (ref 0.44–1.00)
GFR, Estimated: >60 mL/min (ref >60)
Glucose, Bld: 106 mg/dL — ABNORMAL HIGH (ref 70–99)
Potassium: 3.1 mmol/L — ABNORMAL LOW (ref 3.5–5.1)
Sodium: 140 mmol/L (ref 135–145)
Total Bilirubin: 0.8 mg/dL (ref 0.3–1.2)
Total Protein: 7.7 g/dL (ref 6.5–8.1)

## 2020-02-12 LAB — CBC WITH DIFFERENTIAL/PLATELET
Abs Immature Granulocytes: 0.01 10*3/uL (ref 0.00–0.07)
Basophils Absolute: 0 10*3/uL (ref 0.0–0.1)
Basophils Relative: 1 %
Eosinophils Absolute: 0.4 10*3/uL (ref 0.0–0.5)
Eosinophils Relative: 7 %
HCT: 38.3 % (ref 36.0–46.0)
Hemoglobin: 12.6 g/dL (ref 12.0–15.0)
Immature Granulocytes: 0 %
Lymphocytes Relative: 35 %
Lymphs Abs: 1.9 10*3/uL (ref 0.7–4.0)
MCH: 29.4 pg (ref 26.0–34.0)
MCHC: 32.9 g/dL (ref 30.0–36.0)
MCV: 89.3 fL (ref 80.0–100.0)
Monocytes Absolute: 0.5 10*3/uL (ref 0.1–1.0)
Monocytes Relative: 8 %
Neutro Abs: 2.7 10*3/uL (ref 1.7–7.7)
Neutrophils Relative %: 49 %
Platelets: 215 10*3/uL (ref 150–400)
RBC: 4.29 MIL/uL (ref 3.87–5.11)
RDW: 13.8 % (ref 11.5–15.5)
WBC: 5.5 10*3/uL (ref 4.0–10.5)
nRBC: 0 % (ref 0.0–0.2)

## 2020-02-12 MED ORDER — SODIUM CHLORIDE 0.9% FLUSH
10.0000 mL | Freq: Once | INTRAVENOUS | Status: AC
  Administered 2020-02-12: 10 mL
  Filled 2020-02-12: qty 10

## 2020-02-12 MED ORDER — VENLAFAXINE HCL ER 75 MG PO CP24: 75.0000 mg | ORAL_CAPSULE | Freq: Every day | ORAL | 6 refills | Status: DC

## 2020-02-12 MED FILL — VENLAFAXINE HCL ER 75 MG CA: 75 | 60 days supply | Qty: 60 | Fill #0

## 2020-02-13 ENCOUNTER — Telehealth: Payer: Self-pay | Admitting: Oncology

## 2020-02-13 ENCOUNTER — Ambulatory Visit: Payer: Medicaid Other

## 2020-02-13 NOTE — Telephone Encounter (Signed)
Scheduled appts per 2/7 los. Pt confirmed appt dates and times. Pt stated she would refer to mychart for appts scheduled further out.

## 2020-02-15 ENCOUNTER — Ambulatory Visit: Payer: Medicaid Other | Admitting: Rehabilitation

## 2020-02-16 NOTE — Progress Notes (Signed)
Pharmacist Chemotherapy Monitoring - Initial Assessment    Anticipated start date: 02/19/20  Regimen:  . Are orders appropriate based on the patient's diagnosis, regimen, and cycle? Yes . Does the plan date match the patient's scheduled date? Yes . Is the sequencing of drugs appropriate? Yes . Are the premedications appropriate for the patient's regimen? Yes . Prior Authorization for treatment is: Approved o If applicable, is the correct biosimilar selected based on the patient's insurance? not applicable  Organ Function and Labs: Marland Kitchen Are dose adjustments needed based on the patient's renal function, hepatic function, or hematologic function? Yes . Are appropriate labs ordered prior to the start of patient's treatment? Yes . Other organ system assessment, if indicated: N/A . The following baseline labs, if indicated, have been ordered: N/A  Dose Assessment: . Are the drug doses appropriate? Yes . Are the following correct: o Drug concentrations Yes o IV fluid compatible with drug Yes o Administration routes Yes o Timing of therapy Yes . If applicable, does the patient have documented access for treatment and/or plans for port-a-cath placement? yes . If applicable, have lifetime cumulative doses been properly documented and assessed? yes Lifetime Dose Tracking  . Doxorubicin: 234.896 mg/m2 (552 mg) = 52.2 % of the maximum lifetime dose of 450 mg/m2  . Carboplatin: 2,400 mg = 0.01 % of the maximum lifetime dose of 999,999,999 mg  o   Toxicity Monitoring/Prevention: . The patient has the following take home antiemetics prescribed: Prochlorperazine . The patient has the following take home medications prescribed: N/A . Medication allergies and previous infusion related reactions, if applicable, have been reviewed and addressed. Yes . The patient's current medication list has been assessed for drug-drug interactions with their chemotherapy regimen. no significant drug-drug interactions  were identified on review.  Order Review: . Are the treatment plan orders signed? Yes . Is the patient scheduled to see a provider prior to their treatment? Yes  I verify that I have reviewed each item in the above checklist and answered each question accordingly.  Philomena Course, [place], 02/16/2020  3:16 PM

## 2020-02-18 NOTE — Progress Notes (Signed)
[facility]  [phone] [phone]     ID: [name] DOB: [birth year]  MR#: [id number]  VXB#:[id number]  Patient Care Team: [name], MD as PCP - General (Internal Medicine) , [name], MD as Consulting Physician (Oncology) [name], MD as Attending Physician (Radiation Oncology) Delice Bison Charlestine Massed, NP as Nurse Practitioner (Hematology and Oncology) Rolm Bookbinder, MD as Consulting Physician (General Surgery) Claudia Desanctis Steffanie Dunn, MD as Consulting Physician (Plastic Surgery) Mauro Kaufmann, RN as Oncology Nurse Navigator Rockwell Germany, RN as Oncology Nurse Navigator Milus Banister, MD as Attending Physician (Gastroenterology) OTHER MD:   CHIEF COMPLAINT: Triple negative breast cancer; BRCA2 positive  CURRENT TREATMENT: adjuvant chemotherapy (CMF)   INTERVAL HISTORY: [name] returns today for a follow-up and treatment of her triple negative breast cancer accompanied by her husband [name].   She is scheduled to start adjuvant chemotherapy today, consisting of cyclophosphamide, methotrexate, fluorouracil (CMF) given every 21 days x 8.  We had scheduled her originally for a week ago but she had wound dehiscence and we wanted to wait a week until she had a chance to see her surgeon.  She did see [name] last week.  She is not doing wet-to-dry packing but simply showering normally and keeping the area clean.  Her bilateral salpingo-oophorectomy and hysterectomy has been postponed until after the chemotherapy has been completed.   REVIEW OF SYSTEMS: [name] is not exercising regularly.  She is doing some physical therapy exercises and she walks a little around the house.  She is not eating as much she says.  She has had no unusual headaches visual changes cough phlegm production pleurisy or shortness of breath or change in bowel or bladder habits.  A detailed review of systems is otherwise stable   COVID 19  VACCINATION STATUS: not vaccinated (as of 02/12/2020)   BREAST CANCER HISTORY: From the original intake note:  The patient was seen in the emergency room 05/16/2016 with nonspecific complaints but also noting that she had a tender lump in her lateral left breast which she said she had noted the day before. Exam by the emergency room physician confirmed a 3 cm soft left lateral breast mass without overlying erythema or nipple changes. This was felt to be most consistent with fibrocystic change but the patient was referred back to her primary care physician for further evaluation. She saw the physician assistant in May 18 and then Dr. Wynetta Emery on 06/12/2016 who scheduled a bilateral diagnostic mammography with tomography and left breast ultrasonography at the [facility] 06/18/2016. This found the breast density to be category B. In the left breast upper outer quadrant there was an area of asymmetry measuring up to 10.3 cm. On exam this was firm and palpable and on ultrasound there was an irregular mass measuring 2.5 cm with some subtle changes in the surrounding tissue. In the left lower axilla there were 2 suspicious-looking lymph nodes.  Biopsy of the left breast upper outer quadrant and one of the suspicious lymph nodes 06/19/2016 showed ([id number]) both to be involved by invasive ductal carcinoma, grade 3, estrogen and progesterone receptor negative, HER-2 not amplified with a signals ratio being 1.53-1.74 and the number per cell 2.35-3.05. The MIB-1 was 90%.  The patient's subsequent history is as detailed below.   PAST MEDICAL HISTORY: Past Medical History:  Diagnosis Date  . Arthritis   . Breast cancer ([facility])    Left Breast Cancer  . Breast cancer ([facility]) 11/24/2019  left   . Complication of anesthesia    DAY SURGERY 2010 OR 2011 ASPIRATED AND STAYED OVERNIGHT  . History of radiation therapy 03/11/17- 04/21/17   Left breast, 2 Gy in 25 fractions for a total dose of 50 Gy, Boost, 2 Gy in 5  fractions for a total dose of 10 Gy  . Hypertension    OFF MEDS  SINCE CHEMO X 5 MONTHS  . Malignant neoplasm of upper-outer quadrant of left breast in female, estrogen receptor negative ([facility]) 06/23/2016  . Personal history of chemotherapy 2019   Left Breast Cancer  07-2016-12-2016  . Personal history of radiation therapy 2019   Left Breast Cancer    PAST SURGICAL HISTORY: Past Surgical History:  Procedure Laterality Date  . ANKLE SURGERY    . BREAST LUMPECTOMY Left 01/27/2017  . BREAST LUMPECTOMY WITH RADIOACTIVE SEED AND SENTINEL LYMPH NODE BIOPSY Left 01/27/2017   Procedure: LEFT BREAST LUMPECTOMY WITH RADIOACTIVE SEED LOCALIZATION, LEFT AXILLARY DEEP LYMPH NODE BIOPSY WITH RADIOACTIVE SEED LOCALIZATION, LEFT AXILLARY SENTINEL LYMPH NODE MAPPING AND BIOPSY WITH BLUE DYE INJECTION;  Surgeon: [name], MD;  Location: [facility];  Service: General;  Laterality: Left;  . COLONOSCOPY WITH PROPOFOL N/A 02/01/2020   Procedure: COLONOSCOPY WITH PROPOFOL;  Surgeon: Milus Banister, MD;  Location: [facility];  Service: Endoscopy;  Laterality: N/A;  . ENDOSCOPIC MUCOSAL RESECTION  02/01/2020   Procedure: ENDOSCOPIC MUCOSAL RESECTION;  Surgeon: Milus Banister, MD;  Location: [facility];  Service: Endoscopy;;  . HEMOSTASIS CLIP PLACEMENT  02/01/2020   Procedure: HEMOSTASIS CLIP PLACEMENT;  Surgeon: Milus Banister, MD;  Location: [facility];  Service: Endoscopy;;  . IR FLUORO GUIDE PORT INSERTION RIGHT  07/14/2016  . IR US GUIDE VASC ACCESS RIGHT  07/14/2016  . MASTECTOMY W/ SENTINEL NODE BIOPSY Bilateral 01/09/2020   Procedure: BILATERAL MASTECTOMY WITH LEFT AXILLARY SENTINEL LYMPH NODE BIOPSY;  Surgeon: Rolm Bookbinder, MD;  Location: [facility];  Service: General;  Laterality: Bilateral;  . PORT-A-CATH REMOVAL Right 01/27/2017   Procedure: REMOVAL PORT-A-CATH;  Surgeon: [name], MD;  Location: [facility];  Service: General;  Laterality: Right;  . PORTACATH PLACEMENT Right  01/09/2020   Procedure: INSERTION PORT-A-CATH;  Surgeon: Rolm Bookbinder, MD;  Location: [facility];  Service: General;  Laterality: Right;  . SUBMUCOSAL LIFTING INJECTION  02/01/2020   Procedure: SUBMUCOSAL LIFTING INJECTION;  Surgeon: Milus Banister, MD;  Location[facility];  Service: Endoscopy;;  . TUBAL LIGATION    . WISDOM TOOTH EXTRACTION      FAMILY HISTORY Family History  Problem Relation Age of Onset  . Diabetes Mother   . Hypertension Mother   . Arthritis Mother   . Colon cancer Father 60       died .76 metastatic at time of diagnosis  . Diabetes Father   . Heart disease Father   . Hypertension Father   . Breast cancer Maternal Grandmother 57       d.60s  . Breast cancer Maternal Aunt 52  . Cervical cancer Maternal Aunt 64  . Breast cancer Maternal Aunt 46       d.50s  . Cancer Maternal Uncle        d.62s unspecified type of cancer  . Breast cancer Cousin 62       paternal first-cousin (daughter of unaffected aunt)  . Cancer Maternal Aunt 72       "Female Cancer"  . Cancer Maternal Aunt  unknown cancer  . Brain cancer Daughter 1  . Esophageal cancer Other   . Colon polyps Neg Hx   . Rectal cancer Neg Hx   . Stomach cancer Neg Hx   The patient's father died at age 68 from colon cancer which she never had treated. The patient's mother is living, 1 years old as of June 2018. The patient has one brother, one sister. On the mother's side there is a history of breast cancer in the grandmother, age 22, and then 1 aunt with breast cancer age 46, and another with lung cancer age 33. There is no history of ovarian cancer in the family.   GYNECOLOGIC HISTORY:  No LMP recorded (lmp unknown). Patient is postmenopausal. Menarche age 34, first live birth age 56. She is GX P4. Her periods have never been regular. She had a period in June 2018 but not for 4 months before that. She took birth control for some years remotely with no complications. She is  status post bilateral tubal ligation.   SOCIAL HISTORY:  The patient's husband owns and runs a food truck.. They serve mostly [place] and [place] food. 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 Her husband Penny Arrambide (goes by "[name]") is originally from [name]. The patient has two children from her first marriage. Her son [name], who is 29 years old as of Oct 2021, was previously in the [employer], but now is taking courses at [employer] and  her 73 y/o daughter [name], who was studying psychology at [name] before her optic pathway germinoma, which has left her 97% blind.  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 scholarship, doing premed) and 86 as of October 2020    ADVANCED DIRECTIVES: In the absence of any documents to the contrary the patient's husband is her healthcare power of attorney   HEALTH MAINTENANCE: Social History   Tobacco Use  . Smoking status: Never Smoker  . Smokeless tobacco: Never Used  Vaping Use  . Vaping Use: Never used  Substance Use Topics  . Alcohol use: No  . Drug use: No     Colonoscopy: 02/01/2020  ULA:GTXM 2018  Bone density:Never   Allergies  Allergen Reactions  . Carboplatin Other (See Comments)    Red, blotchy, hot    Current Outpatient Medications  Medication Sig Dispense Refill  . acetaminophen (TYLENOL) 325 MG tablet Take 650 mg by mouth 3 (three) times daily.    Marland Kitchen gabapentin (NEURONTIN) 100 MG capsule Take 200 mg by mouth 2 (two) times daily.    Marland Kitchen gabapentin (NEURONTIN) 300 MG capsule TAKE 2 CAPSULES (600 MG TOTAL) BY MOUTH AT BEDTIME. 90 capsule 4  . lidocaine-prilocaine (EMLA) cream Apply to affected area once 30 g 3  . methocarbamol (ROBAXIN) 500 MG tablet Take 1 tablet (500 mg total) by mouth every 6 (six) hours as needed for  muscle spasms. 30 tablet 1  . naproxen sodium (ALEVE) 220 MG tablet Take 220 mg by mouth 3 (three) times daily.    . prochlorperazine (COMPAZINE) 10 MG tablet Take 1 tablet (10 mg total) by mouth every 6 (six) hours as needed (Nausea or vomiting). 30 tablet 1  . senna (SENOKOT) 8.6 MG tablet Take 2 tablets by mouth daily.    . traMADol (ULTRAM) 50 MG tablet Take 1-2 tablets (50-100 mg  total) by mouth every 6 (six) hours as needed. 60 tablet 0  . venlafaxine XR (EFFEXOR-XR) 75 MG 24 hr capsule Take 1 capsule (75 mg total) by mouth daily with breakfast. 60 capsule 6   No current facility-administered medications for this visit.    OBJECTIVE: White woman in no acute distress  Vitals:   02/19/20 0817  BP: (!) 146/84  Pulse: 93  Resp: 18  Temp: 98 F (36.7 C)  SpO2: 96%     Body mass index is 40.18 kg/m.   Filed Weights   02/19/20 0817  Weight: 260 lb 6.4 oz (118.1 kg)   ECOG FS:1 - Symptomatic but completely ambulatory  Sclerae unicteric, EOMs intact Wearing a mask No cervical or supraclavicular adenopathy Lungs no rales or rhonchi Heart regular rate and rhythm Abd soft, nontender, positive bowel sounds MSK no focal spinal tenderness, no upper extremity lymphedema Neuro: nonfocal, well oriented, appropriate affect Breasts: Status post bilateral mastectomies.  The wound on the left has dehisced.  The base is yellow to pink.  There is no evidence of inflammation or infection.      LAB RESULTS:  CMP     Component Value Date/Time   NA 140 02/12/2020 0820   NA 140 01/07/2017 1342   K 3.1 (L) 02/12/2020 0820   K 3.5 01/07/2017 1342   CL 100 02/12/2020 0820   CO2 29 02/12/2020 0820   CO2 27 01/07/2017 1342   GLUCOSE 106 (H) 02/12/2020 0820   GLUCOSE 148 (H) 01/07/2017 1342   BUN 8 02/12/2020 0820   BUN 6.2 (L) 01/07/2017 1342   CREATININE 0.82 02/12/2020 0820   CREATININE 0.79 12/28/2019 1322   CREATININE 0.7 01/07/2017 1342   CALCIUM 9.0 02/12/2020 0820   CALCIUM  9.4 01/07/2017 1342   PROT 7.7 02/12/2020 0820   PROT 7.0 01/07/2017 1342   ALBUMIN 4.0 02/12/2020 0820   ALBUMIN 3.7 01/07/2017 1342   AST 27 02/12/2020 0820   AST 20 12/28/2019 1322   AST 23 01/07/2017 1342   ALT 23 02/12/2020 0820   ALT 19 12/28/2019 1322   ALT 24 01/07/2017 1342   ALKPHOS 92 02/12/2020 0820   ALKPHOS 105 01/07/2017 1342   BILITOT 0.8 02/12/2020 0820   BILITOT 0.9 12/28/2019 1322   BILITOT 0.84 01/07/2017 1342   GFRNONAA >60 02/12/2020 0820   GFRNONAA >60 12/28/2019 1322   GFRAA >60 05/23/2019 1921   GFRAA >60 10/13/2018 1346    No results found for: [name], A1GS, A2GS, BETS, BETA2SER, GAMS, MSPIKE, SPEI  No results found for: KPAFRELGTCHN, LAMBDASER, [facility]  Lab Results  Component Value Date   WBC 5.5 02/12/2020   NEUTROABS 2.7 02/12/2020   HGB 12.6 02/12/2020   HCT 38.3 02/12/2020   MCV 89.3 02/12/2020   PLT 215 02/12/2020      Chemistry      Component Value Date/Time   NA 140 02/12/2020 0820   NA 140 01/07/2017 1342   K 3.1 (L) 02/12/2020 0820   K 3.5 01/07/2017 1342   CL 100 02/12/2020 0820   CO2 29 02/12/2020 0820   CO2 27 01/07/2017 1342   BUN 8 02/12/2020 0820   BUN 6.2 (L) 01/07/2017 1342   CREATININE 0.82 02/12/2020 0820   CREATININE 0.79 12/28/2019 1322   CREATININE 0.7 01/07/2017 1342      Component Value Date/Time   CALCIUM 9.0 02/12/2020 0820   CALCIUM 9.4 01/07/2017 1342   ALKPHOS 92 02/12/2020 0820   ALKPHOS 105 01/07/2017 1342  AST 27 02/12/2020 0820   AST 20 12/28/2019 1322   AST 23 01/07/2017 1342   ALT 23 02/12/2020 0820   ALT 19 12/28/2019 1322   ALT 24 01/07/2017 1342   BILITOT 0.8 02/12/2020 0820   BILITOT 0.9 12/28/2019 1322   BILITOT 0.84 01/07/2017 1342       No results found for: LABCA2  No components found for: ATFTDD220  No results for input(s): INR in the last 168 hours.  Urinalysis    Component Value Date/Time   COLORURINE YELLOW 12/05/2019 1154   APPEARANCEUR  CLOUDY (A) 12/05/2019 1154   LABSPEC 1.024 12/05/2019 1154   LABSPEC 1.025 11/23/2016 1251   PHURINE 5.0 12/05/2019 1154   GLUCOSEU NEGATIVE 12/05/2019 1154   GLUCOSEU Negative 11/23/2016 1251   HGBUR SMALL (A) 12/05/2019 1154   BILIRUBINUR NEGATIVE 12/05/2019 1154   BILIRUBINUR Negative 11/23/2016 1251   KETONESUR NEGATIVE 12/05/2019 1154   PROTEINUR NEGATIVE 12/05/2019 1154   UROBILINOGEN 0.2 11/23/2016 1251   NITRITE NEGATIVE 12/05/2019 1154   LEUKOCYTESUR NEGATIVE 12/05/2019 1154   LEUKOCYTESUR Negative 11/23/2016 1251    STUDIES: DG Abd 2 Views  Result Date: 02/01/2020 CLINICAL DATA:  50 year old female status post colonoscopy this morning with polyp resection and mucosal clipping. Query pneumoperitoneum. EXAM: ABDOMEN - 2 VIEW COMPARISON:  CT Abdomen and Pelvis 01/18/2019. FINDINGS: Portable semi upright and supine views of the abdomen and pelvis. A cluster of surgical clips are demonstrated along the ascending colon right lateral abdomen. Paucity of bowel gas throughout the abdomen and pelvis, nonobstructed pattern. No pneumoperitoneum is identified. Both lung bases appear negative. No acute osseous abnormality identified. Incidental pelvic phleboliths. IMPRESSION: 1. No pneumoperitoneum identified. If suspicion persists a left side down decubitus portable radiograph or Abdomen CT would be most sensitive. 2. Clustered metal clips along the right colon. Normal bowel gas pattern. Electronically Signed   By: Genevie Ann M.D.   On: 02/01/2020 08:53     ELIGIBLE FOR AVAILABLE RESEARCH PROTOCOL: no   ASSESSMENT: 50 y.o. [place] woman status post left breast upper outer quadrant and left axillary lymph node biopsy 06/19/2016, both positive for a clinical T2-T3 N1, stage IIIB-C invasive ductal carcinoma, grade 3, triple negative, with an MIB-1 of 90%  (1) genetics testing 08/04/2016 showed a variant of uncertain significance in the BRCA2 namely c.8169T>A (p.[id number]). This has been  classified as likely pathogenic by [place] genetics but not other labs. Additional testing of the patient's mother and maternal aunt is pending. Otherwise [facility]'s Common Hereditary Cancers Panel found no deleterious mutations in APC, ATM, AXIN2, BARD1, BMPR1A, BRCA1, BRCA2, BRIP1, CDH1, CDKN2A, CHEK2, CTNNA1, DICER1, EPCAM, GREM1, HOXB13, KIT, MEN1, MLH1, MSH2, MSH3, MSH6, MUTYH, NBN, NF1, NTHL1, PALB2, PDGFRA, PMS2, POLD1, POLE, PTEN, RAD50, RAD51C, RAD51D, SDHA, SDHB, SDHC, SDHD, SMAD4, SMARCA4, STK11, TP53, TSC1, TSC2, and VHL.  (2) neoadjuvant chemotherapy consisting of doxorubicin and cyclophosphamide in dose dense fashion 4 completed 09/10/2016 followed by paclitaxel weekly 12 given with carboplatin   (a) cycle 4 of cyclophosphamide and doxorubicin was delayed 10 days and dose decreased 10% because of febrile neutropenia after cycle 3  (b) cycle 6 of Paclitaxel and Carboplatin delayed due to neutropenia, therefore Granix added to Wednesday, Thursday, Friday following chemotherapy days  (3) status post left lumpectomy and left axillary sentinel lymph node sampling 01/27/2017 with pathology showing a complete pathologic response (ypT0 ypN0);   (a) 5 left axillary lymph nodes removed  (4) adjuvant radiation 03/11/2017-04/21/2017 Site/dose:   1. Left breast, 2 Gy in  25 fractions for a total dose of 50 Gy                      2. 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  (8) status post bilateral mastectomies 01/09/2020 showing  (a) on the right, no evidence of malignancy  (b) on the left, a pT2 pNX, stage IIB invasive ductal carcinoma, grade 3, with ample margins   (c) repeat prognostic panel on the left again weakly estrogen receptor positive, HER2 negative, progesterone receptor negative  (9) adjuvant therapy with cyclophosphamide, methotrexate, fluorouracil (CMF) given every 21 days x 8 to start 02/19/2020  (10) olaparib, adjuvant estrogens to follow  (11) foundation 1 requested on 01/09/2020 material found in addition to the BRCA2 mutation, a 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  ERV B2 and PI K3 CA were negative, and the microsatellite status was stable, with low mutational burden (4/MB)   PLAN: [name] is ready to start with chemotherapy and we reviewed nausea and other supportive medicines today.  I am going to bring her back in 1 week just to make sure her counts remain adequate and then of course I will see her again 3 weeks from now with her next cycle.  We will be following her wound.  This will take some time to heal but right now there is no evidence of inflammation or infection.  She has instructions from her surgeon on how to take care of it.  We discussed the foundation 1 results which aside from the BRCA2 mutation do not give [place] any information regarding additional treatment targets  She knows to call for any other issue that may develop before the next visit  Total encounter time 25 minutes.*  , [name], MD  02/19/20 8:30 AM Medical Oncology and Hematology [facility] [place], [place] [address] Tel. [phone]    Fax. [phone]   I, Wilburn Mylar, am acting as scribe for Dr. [name]. .  I, Lurline Del MD, have reviewed the above documentation for accuracy and completeness, and I agree with the above.   *Total Encounter Time as defined by the Centers for Medicare and Medicaid Services includes, in addition to the face-to-face time of a patient visit (documented in the note above) non-face-to-face time: obtaining and reviewing outside history, ordering and reviewing medications, tests or procedures, care coordination (communications with other health care professionals or caregivers) and documentation in the medical record.

## 2020-02-19 ENCOUNTER — Inpatient Hospital Stay: Payer: Medicaid Other

## 2020-02-19 ENCOUNTER — Other Ambulatory Visit: Payer: Self-pay

## 2020-02-19 ENCOUNTER — Inpatient Hospital Stay (HOSPITAL_BASED_OUTPATIENT_CLINIC_OR_DEPARTMENT_OTHER): Payer: Medicaid Other | Admitting: Oncology

## 2020-02-19 ENCOUNTER — Encounter: Payer: Self-pay | Admitting: *Deleted

## 2020-02-19 VITALS — BP 146/84 | HR 93 | Temp 98.0°F | Resp 18 | Ht 67.5 in | Wt 260.4 lb

## 2020-02-19 DIAGNOSIS — C50912 Malignant neoplasm of unspecified site of left female breast: Secondary | ICD-10-CM

## 2020-02-19 DIAGNOSIS — Z1509 Genetic susceptibility to other malignant neoplasm: Secondary | ICD-10-CM

## 2020-02-19 DIAGNOSIS — Z9013 Acquired absence of bilateral breasts and nipples: Secondary | ICD-10-CM

## 2020-02-19 DIAGNOSIS — Z1501 Genetic susceptibility to malignant neoplasm of breast: Secondary | ICD-10-CM

## 2020-02-19 DIAGNOSIS — C50412 Malignant neoplasm of upper-outer quadrant of left female breast: Secondary | ICD-10-CM

## 2020-02-19 DIAGNOSIS — Z5111 Encounter for antineoplastic chemotherapy: Secondary | ICD-10-CM | POA: Diagnosis not present

## 2020-02-19 DIAGNOSIS — Z171 Estrogen receptor negative status [ER-]: Secondary | ICD-10-CM | POA: Diagnosis not present

## 2020-02-19 MED ORDER — METHOTREXATE SODIUM (PF) CHEMO INJECTION 250 MG/10ML
40.0000 mg/m2 | Freq: Once | INTRAMUSCULAR | Status: AC
  Administered 2020-02-19: 94 mg via INTRAVENOUS
  Filled 2020-02-19: qty 3.76

## 2020-02-19 MED ORDER — PALONOSETRON HCL INJECTION 0.25 MG/5ML
INTRAVENOUS | Status: AC
  Filled 2020-02-19: qty 5

## 2020-02-19 MED ORDER — PALONOSETRON HCL INJECTION 0.25 MG/5ML
0.2500 mg | Freq: Once | INTRAVENOUS | Status: AC
  Administered 2020-02-19: 0.25 mg via INTRAVENOUS

## 2020-02-19 MED ORDER — SODIUM CHLORIDE 0.9 % IV SOLN
10.0000 mg | Freq: Once | INTRAVENOUS | Status: AC
  Administered 2020-02-19: 10 mg via INTRAVENOUS
  Filled 2020-02-19: qty 10
  Filled 2020-02-19: qty 1

## 2020-02-19 MED ORDER — FLUOROURACIL CHEMO INJECTION 2.5 GM/50ML
600.0000 mg/m2 | Freq: Once | INTRAVENOUS | Status: AC
  Administered 2020-02-19: 1400 mg via INTRAVENOUS
  Filled 2020-02-19: qty 28

## 2020-02-19 MED ORDER — SODIUM CHLORIDE 0.9 % IV SOLN
Freq: Once | INTRAVENOUS | Status: AC
  Filled 2020-02-19: qty 250

## 2020-02-19 MED ORDER — SODIUM CHLORIDE 0.9% FLUSH
10.0000 mL | INTRAVENOUS | Status: DC | PRN
  Administered 2020-02-19: 10 mL
  Filled 2020-02-19: qty 10

## 2020-02-19 MED ORDER — SODIUM CHLORIDE 0.9 % IV SOLN
600.0000 mg/m2 | Freq: Once | INTRAVENOUS | Status: AC
  Administered 2020-02-19: 1420 mg via INTRAVENOUS
  Filled 2020-02-19: qty 71

## 2020-02-19 MED ORDER — HEPARIN SOD (PORK) LOCK FLUSH 100 UNIT/ML IV SOLN
500.0000 [IU] | Freq: Once | INTRAVENOUS | Status: AC | PRN
  Administered 2020-02-19: 500 [IU]
  Filled 2020-02-19: qty 5

## 2020-02-19 NOTE — Progress Notes (Signed)
OK to treat with lab from 02/12/20. OK to treat with open wound per Dr [name]

## 2020-02-19 NOTE — Patient Instructions (Signed)
[facility] Discharge Instructions for Patients Receiving Chemotherapy  Today you received the following chemotherapy agents: cyclophosphamide/methotrexate/fluorouracil.  To help prevent nausea and vomiting after your treatment, we encourage you to take your nausea medication as directed.   If you develop nausea and vomiting that is not controlled by your nausea medication, call the clinic.   BELOW ARE SYMPTOMS THAT SHOULD BE REPORTED IMMEDIATELY:  *FEVER GREATER THAN 100.5 F  *CHILLS WITH OR WITHOUT FEVER  NAUSEA AND VOMITING THAT IS NOT CONTROLLED WITH YOUR NAUSEA MEDICATION  *UNUSUAL SHORTNESS OF BREATH  *UNUSUAL BRUISING OR BLEEDING  TENDERNESS IN MOUTH AND THROAT WITH OR WITHOUT PRESENCE OF ULCERS  *URINARY PROBLEMS  *BOWEL PROBLEMS  UNUSUAL RASH Items with * indicate a potential emergency and should be followed up as soon as possible.  Feel free to call the clinic should you have any questions or concerns. The clinic phone number is [phone].  Please show the [place] at check-in to the Emergency Department and triage nurse.  Cyclophosphamide Injection What is this medicine? CYCLOPHOSPHAMIDE (sye kloe FOSS fa mide) is a chemotherapy drug. It slows the growth of cancer cells. This medicine is used to treat many types of cancer like lymphoma, myeloma, leukemia, breast cancer, and ovarian cancer, to name a few. This medicine may be used for other purposes; ask your health care provider or pharmacist if you have questions. COMMON BRAND NAME(S): Cytoxan, Neosar What should I tell my health care provider before I take this medicine? They need to know if you have any of these conditions:  heart disease  history of irregular heartbeat  infection  kidney disease  liver disease  low blood counts, like white cells, platelets, or red blood cells  on hemodialysis  recent or ongoing radiation therapy  scarring or thickening of the  lungs  trouble passing urine  an unusual or allergic reaction to cyclophosphamide, other medicines, foods, dyes, or preservatives  pregnant or trying to get pregnant  breast-feeding How should I use this medicine? This drug is usually given as an injection into a vein or muscle or by infusion into a vein. It is administered in a hospital or clinic by a specially trained health care professional. Talk to your pediatrician regarding the use of this medicine in children. Special care may be needed. Overdosage: If you think you have taken too much of this medicine contact a poison control center or emergency room at once. NOTE: This medicine is only for you. Do not share this medicine with others. What if I miss a dose? It is important not to miss your dose. Call your doctor or health care professional if you are unable to keep an appointment. What may interact with this medicine?  amphotericin B  azathioprine  certain antivirals for HIV or hepatitis  certain medicines for blood pressure, heart disease, irregular heart beat  certain medicines that treat or prevent blood clots like warfarin  certain other medicines for cancer  cyclosporine  etanercept  indomethacin  medicines that relax muscles for surgery  medicines to increase blood counts  metronidazole This list may not describe all possible interactions. Give your health care provider a list of all the medicines, herbs, non-prescription drugs, or dietary supplements you use. Also tell them if you smoke, drink alcohol, or use illegal drugs. Some items may interact with your medicine. What should I watch for while using this medicine? Your condition will be monitored carefully while you are receiving this medicine. You  may need blood work done while you are taking this medicine. Drink water or other fluids as directed. Urinate often, even at night. Some products may contain alcohol. Ask your health care professional if  this medicine contains alcohol. Be sure to tell all health care professionals you are taking this medicine. Certain medicines, like metronidazole and disulfiram, can cause an unpleasant reaction when taken with alcohol. The reaction includes flushing, headache, nausea, vomiting, sweating, and increased thirst. The reaction can last from 30 minutes to several hours. Do not become pregnant while taking this medicine or for 1 year after stopping it. Women should inform their health care professional if they wish to become pregnant or think they might be pregnant. Men should not father a child while taking this medicine and for 4 months after stopping it. There is potential for serious side effects to an unborn child. Talk to your health care professional for more information. Do not breast-feed an infant while taking this medicine or for 1 week after stopping it. This medicine has caused ovarian failure in some women. This medicine may make it more difficult to get pregnant. Talk to your health care professional if you are concerned about your fertility. This medicine has caused decreased sperm counts in some men. This may make it more difficult to father a child. Talk to your health care professional if you are concerned about your fertility. Call your health care professional for advice if you get a fever, chills, or sore throat, or other symptoms of a cold or flu. Do not treat yourself. This medicine decreases your body's ability to fight infections. Try to avoid being around people who are sick. Avoid taking medicines that contain aspirin, acetaminophen, ibuprofen, naproxen, or ketoprofen unless instructed by your health care professional. These medicines may hide a fever. Talk to your health care professional about your risk of cancer. You may be more at risk for certain types of cancer if you take this medicine. If you are going to need surgery or other procedure, tell your health care professional that  you are using this medicine. Be careful brushing or flossing your teeth or using a toothpick because you may get an infection or bleed more easily. If you have any dental work done, tell your dentist you are receiving this medicine. What side effects may I notice from receiving this medicine? Side effects that you should report to your doctor or health care professional as soon as possible:  allergic reactions like skin rash, itching or hives, swelling of the face, lips, or tongue  breathing problems  nausea, vomiting  signs and symptoms of bleeding such as bloody or black, tarry stools; red or dark brown urine; spitting up blood or brown material that looks like coffee grounds; red spots on the skin; unusual bruising or bleeding from the eyes, gums, or nose  signs and symptoms of heart failure like fast, irregular heartbeat, sudden weight gain; swelling of the ankles, feet, hands  signs and symptoms of infection like fever; chills; cough; sore throat; pain or trouble passing urine  signs and symptoms of kidney injury like trouble passing urine or change in the amount of urine  signs and symptoms of liver injury like dark yellow or brown urine; general ill feeling or flu-like symptoms; light-colored stools; loss of appetite; nausea; right upper belly pain; unusually weak or tired; yellowing of the eyes or skin Side effects that usually do not require medical attention (report to your doctor or health care professional if  they continue or are bothersome):  confusion  decreased hearing  diarrhea  facial flushing  hair loss  headache  loss of appetite  missed menstrual periods  signs and symptoms of low red blood cells or anemia such as unusually weak or tired; feeling faint or lightheaded; falls  skin discoloration This list may not describe all possible side effects. Call your doctor for medical advice about side effects. You may report side effects to FDA at  1-800-FDA-1088. Where should I keep my medicine? This drug is given in a hospital or clinic and will not be stored at home. NOTE: This sheet is a summary. It may not cover all possible information. If you have questions about this medicine, talk to your doctor, pharmacist, or health care provider.  2021 [employer]/Gold Standard (2018-09-26 09:53:29)  Methotrexate injection What is this medicine? METHOTREXATE (METH oh TREX ate) is a chemotherapy medicine. It treats certain types of cancer. Some of the cancers treated are breast cancer, head and neck cancer, leukemia, lymphoma, and osteosarcoma. This medicine can also be used to treat psoriasis and certain kinds of arthritis. This medicine may be used for other purposes; ask your health care provider or pharmacist if you have questions. What should I tell my health care provider before I take this medicine? They need to know if you have any of these conditions:  fluid in the stomach area or lungs  if you often drink alcohol  infection or immune system problems  kidney disease  liver disease  low blood counts (white cells, platelets, or red blood cells)  lung disease  recent or ongoing radiation  recent or upcoming vaccine  stomach ulcers  ulcerative colitis  an unusual or allergic reaction to methotrexate, other medicines, foods, dyes, or preservatives  pregnant or trying to get pregnant  breast-feeding How should I use this medicine? This medicine is for infusion into a vein or for injection into muscle or into the spinal fluid (whichever applies). It is usually given by a health care professional in a hospital or clinic setting. In rare cases, you might get this medicine at home. You will be taught how to give this medicine. Use exactly as directed. Take your medicine at regular intervals. Do not take your medicine more often than directed. If this medicine is used for arthritis or psoriasis, it should be taken weekly, NOT  daily. It is important that you put your used needles and syringes in a special sharps container. Do not put them in a trash can. If you do not have a sharps container, call your pharmacist or healthcare provider to get one. Talk to your pediatrician regarding the use of this medicine in children. While this drug may be prescribed for children as young as 2 years for selected conditions, precautions do apply. Overdosage: If you think you have taken too much of this medicine contact a poison control center or emergency room at once. NOTE: This medicine is only for you. Do not share this medicine with others. What if I miss a dose? It is important not to miss your dose. Call your doctor or health care professional if you are unable to keep an appointment. If you give yourself the medicine and you miss a dose, talk with your doctor or health care professional. Do not take double or extra doses. What may interact with this medicine? Do not take this medicine with any of the following medications:  acitretin This medicine may also interact with the following medications:  aspirin or aspirin-like medicines including salicylates  azathioprine  certain antibiotics like chloramphenicol, penicillin, tetracycline  certain medicines that treat or prevent blood clots like warfarin, apixaban, dabigatran, and rivaroxaban  certain medicines for stomach problems like esomeprazole, omeprazole, pantoprazole  cyclosporine  dapsone  diuretics  folic acid  gold  hydroxychloroquine  live virus vaccines  medicines for infection like acyclovir, adefovir, amphotericin B, bacitracin, cidofovir, foscarnet, ganciclovir, gentamicin, pentamidine, vancomycin  mercaptopurine  NSAIDs, medicines for pain and inflammation, like ibuprofen or naproxen  pamidronate  pemetrexed  penicillamine  phenylbutazone  phenytoin  probenacid  pyrimethamine  retinoids such as isotretinoin and  tretinoin  steroid medicines like prednisone or cortisone  sulfonamides like sulfasalazine and trimethoprim/sulfamethoxazole  theophylline  zoledronic acid This list may not describe all possible interactions. Give your health care provider a list of all the medicines, herbs, non-prescription drugs, or dietary supplements you use. Also tell them if you smoke, drink alcohol, or use illegal drugs. Some items may interact with your medicine. What should I watch for while using this medicine? This medicine may make you feel generally unwell. This is not uncommon as chemotherapy can affect healthy cells as well as cancer cells. Report any side effects. Continue your course of treatment even though you feel ill unless your health care provider tells you to stop. Your condition will be monitored carefully while you are receiving this medicine. Avoid alcoholic drinks. This medicine can cause serious side effects. To reduce the risk, your health care provider may give you other medicines to take before receiving this one. Be sure to follow the directions from your health care provider. This medicine can make you more sensitive to the sun. Keep out of the sun. If you cannot avoid being in the sun, wear protective clothing and use sunscreen. Do not use sun lamps or tanning beds/booths. You may get drowsy or dizzy. Do not drive, use machinery, or do anything that needs mental alertness until you know how this medicine affects you. Do not stand or sit up quickly, especially if you are an older patient. This reduces the risk of dizzy or fainting spells. You may need blood work while you are taking this medicine. Call your doctor or health care professional for advice if you get a fever, chills or sore throat, or other symptoms of a cold or flu. Do not treat yourself. This drug decreases your body's ability to fight infections. Try to avoid being around people who are sick. This medicine may increase your risk  to bruise or bleed. Call your doctor or health care professional if you notice any unusual bleeding. Be careful brushing or flossing your teeth or using a toothpick because you may get an infection or bleed more easily. If you have any dental work done, tell your dentist you are receiving this medicine Check with your doctor or health care professional if you get an attack of severe diarrhea, nausea and vomiting, or if you sweat a lot. The loss of too much body fluid can make it dangerous for you to take this medicine. Talk to your doctor about your risk of cancer. You may be more at risk for certain types of cancers if you take this medicine. Do not become pregnant while taking this medicine or for 6 months after stopping it. Women should inform their health care provider if they wish to become pregnant or think they might be pregnant. Men should not father a child while taking this medicine and for 3 months after  stopping it. There is potential for serious harm to an unborn child. Talk to your health care provider for more information. Do not breast-feed an infant while taking this medicine or for 1 week after stopping it. This medicine may make it more difficult to get pregnant or father a child. Talk to your health care provider if you are concerned about your fertility. What side effects may I notice from receiving this medicine? Side effects that you should report to your doctor or health care professional as soon as possible:  allergic reactions like skin rash, itching or hives, swelling of the face, lips, or tongue  back pain  breathing problems or shortness of breath  confusion  diarrhea  dry, nonproductive cough  low blood counts - this medicine may decrease the number of white blood cells, red blood cells and platelets. You may be at increased risk of infections and bleeding  mouth sores  redness, blistering, peeling or loosening of the skin, including inside the  mouth  seizures  severe headaches  signs of infection - fever or chills, cough, sore throat, pain or difficulty passing urine  signs and symptoms of bleeding such as bloody or black, tarry stools; red or dark-brown urine; spitting up blood or brown material that looks like coffee grounds; red spots on the skin; unusual bruising or bleeding from the eye, gums, or nose  signs and symptoms of kidney injury like trouble passing urine or change in the amount of urine  signs and symptoms of liver injury like dark yellow or brown urine; general ill feeling or flu-like symptoms; light-colored stools; loss of appetite; nausea; right upper belly pain; unusually weak or tired; yellowing of the eyes or skin  stiff neck  vomiting Side effects that usually do not require medical attention (report to your doctor or health care professional if they continue or are bothersome):  dizziness  hair loss  headache  stomach pain  upset stomach This list may not describe all possible side effects. Call your doctor for medical advice about side effects. You may report side effects to FDA at 1-800-FDA-1088. Where should I keep my medicine? This medicine is given in a hospital or clinic. It will not be stored at home. NOTE: This sheet is a summary. It may not cover all possible information. If you have questions about this medicine, talk to your doctor, pharmacist, or health care provider.  2021 [employer]/Gold Standard (2019-05-09 10:19:36)  Fluorouracil, 5-FU injection What is this medicine? FLUOROURACIL, 5-FU (flure oh YOOR a sil) is a chemotherapy drug. It slows the growth of cancer cells. This medicine is used to treat many types of cancer like breast cancer, colon or rectal cancer, pancreatic cancer, and stomach cancer. This medicine may be used for other purposes; ask your health care provider or pharmacist if you have questions. COMMON BRAND NAME(S): Adrucil What should I tell my health care  provider before I take this medicine? They need to know if you have any of these conditions:  blood disorders  dihydropyrimidine dehydrogenase (DPD) deficiency  infection (especially a virus infection such as chickenpox, cold sores, or herpes)  kidney disease  liver disease  malnourished, poor nutrition  recent or ongoing radiation therapy  an unusual or allergic reaction to fluorouracil, other chemotherapy, other medicines, foods, dyes, or preservatives  pregnant or trying to get pregnant  breast-feeding How should I use this medicine? This drug is given as an infusion or injection into a vein. It is administered in a hospital  or clinic by a specially trained health care professional. Talk to your pediatrician regarding the use of this medicine in children. Special care may be needed. Overdosage: If you think you have taken too much of this medicine contact a poison control center or emergency room at once. NOTE: This medicine is only for you. Do not share this medicine with others. What if I miss a dose? It is important not to miss your dose. Call your doctor or health care professional if you are unable to keep an appointment. What may interact with this medicine? Do not take this medicine with any of the following medications:  live virus vaccines This medicine may also interact with the following medications:  medicines that treat or prevent blood clots like warfarin, enoxaparin, and dalteparin This list may not describe all possible interactions. Give your health care provider a list of all the medicines, herbs, non-prescription drugs, or dietary supplements you use. Also tell them if you smoke, drink alcohol, or use illegal drugs. Some items may interact with your medicine. What should I watch for while using this medicine? Visit your doctor for checks on your progress. This drug may make you feel generally unwell. This is not uncommon, as chemotherapy can affect healthy  cells as well as cancer cells. Report any side effects. Continue your course of treatment even though you feel ill unless your doctor tells you to stop. In some cases, you may be given additional medicines to help with side effects. Follow all directions for their use. Call your doctor or health care professional for advice if you get a fever, chills or sore throat, or other symptoms of a cold or flu. Do not treat yourself. This drug decreases your body's ability to fight infections. Try to avoid being around people who are sick. This medicine may increase your risk to bruise or bleed. Call your doctor or health care professional if you notice any unusual bleeding. Be careful brushing and flossing your teeth or using a toothpick because you may get an infection or bleed more easily. If you have any dental work done, tell your dentist you are receiving this medicine. Avoid taking products that contain aspirin, acetaminophen, ibuprofen, naproxen, or ketoprofen unless instructed by your doctor. These medicines may hide a fever. Do not become pregnant while taking this medicine. Women should inform their doctor if they wish to become pregnant or think they might be pregnant. There is a potential for serious side effects to an unborn child. Talk to your health care professional or pharmacist for more information. Do not breast-feed an infant while taking this medicine. Men should inform their doctor if they wish to father a child. This medicine may lower sperm counts. Do not treat diarrhea with over the counter products. Contact your doctor if you have diarrhea that lasts more than 2 days or if it is severe and watery. This medicine can make you more sensitive to the sun. Keep out of the sun. If you cannot avoid being in the sun, wear protective clothing and use sunscreen. Do not use sun lamps or tanning beds/booths. What side effects may I notice from receiving this medicine? Side effects that you should  report to your doctor or health care professional as soon as possible:  allergic reactions like skin rash, itching or hives, swelling of the face, lips, or tongue  low blood counts - this medicine may decrease the number of white blood cells, red blood cells and platelets. You may be at  increased risk for infections and bleeding.  signs of infection - fever or chills, cough, sore throat, pain or difficulty passing urine  signs of decreased platelets or bleeding - bruising, pinpoint red spots on the skin, black, tarry stools, blood in the urine  signs of decreased red blood cells - unusually weak or tired, fainting spells, lightheadedness  breathing problems  changes in vision  chest pain  mouth sores  nausea and vomiting  pain, swelling, redness at site where injected  pain, tingling, numbness in the hands or feet  redness, swelling, or sores on hands or feet  stomach pain  unusual bleeding Side effects that usually do not require medical attention (report to your doctor or health care professional if they continue or are bothersome):  changes in finger or toe nails  diarrhea  dry or itchy skin  hair loss  headache  loss of appetite  sensitivity of eyes to the light  stomach upset  unusually teary eyes This list may not describe all possible side effects. Call your doctor for medical advice about side effects. You may report side effects to FDA at 1-800-FDA-1088. Where should I keep my medicine? This drug is given in a hospital or clinic and will not be stored at home. NOTE: This sheet is a summary. It may not cover all possible information. If you have questions about this medicine, talk to your doctor, pharmacist, or health care provider.  2021 [employer] Standard (2018-11-22 15:00:03)

## 2020-02-20 ENCOUNTER — Telehealth: Payer: Self-pay | Admitting: Oncology

## 2020-02-20 ENCOUNTER — Encounter: Payer: Medicaid Other | Admitting: Rehabilitation

## 2020-02-20 ENCOUNTER — Telehealth: Payer: Self-pay | Admitting: *Deleted

## 2020-02-20 NOTE — Telephone Encounter (Signed)
Scheduled appts per 2/14 los. Left voicemail with appt date/time.  

## 2020-02-20 NOTE — Telephone Encounter (Signed)
-----   Message from Bo Mcclintock, RN sent at 02/19/2020 10:24 AM EST ----- Regarding: 1st chemo follow up Dr [name] patient. 1st chemo follow up. Received [facility].

## 2020-02-20 NOTE — Telephone Encounter (Signed)
Called & left message on mobile # to call us back to let us know how she did with her treatment yest.

## 2020-02-22 ENCOUNTER — Ambulatory Visit: Payer: Medicaid Other | Admitting: Rehabilitation

## 2020-02-26 ENCOUNTER — Encounter: Payer: Self-pay | Admitting: *Deleted

## 2020-02-26 ENCOUNTER — Inpatient Hospital Stay: Payer: Medicaid Other

## 2020-02-26 ENCOUNTER — Inpatient Hospital Stay (HOSPITAL_BASED_OUTPATIENT_CLINIC_OR_DEPARTMENT_OTHER): Payer: Medicaid Other | Admitting: Oncology

## 2020-02-26 ENCOUNTER — Other Ambulatory Visit: Payer: Self-pay

## 2020-02-26 VITALS — BP 108/80 | HR 86 | Temp 97.9°F | Resp 18 | Ht 67.5 in | Wt 262.0 lb

## 2020-02-26 DIAGNOSIS — C50412 Malignant neoplasm of upper-outer quadrant of left female breast: Secondary | ICD-10-CM | POA: Diagnosis not present

## 2020-02-26 DIAGNOSIS — Z171 Estrogen receptor negative status [ER-]: Secondary | ICD-10-CM

## 2020-02-26 DIAGNOSIS — Z1501 Genetic susceptibility to malignant neoplasm of breast: Secondary | ICD-10-CM | POA: Diagnosis not present

## 2020-02-26 DIAGNOSIS — K429 Umbilical hernia without obstruction or gangrene: Secondary | ICD-10-CM

## 2020-02-26 DIAGNOSIS — Z9013 Acquired absence of bilateral breasts and nipples: Secondary | ICD-10-CM

## 2020-02-26 DIAGNOSIS — Z1502 Genetic susceptibility to malignant neoplasm of ovary: Secondary | ICD-10-CM

## 2020-02-26 DIAGNOSIS — Z5111 Encounter for antineoplastic chemotherapy: Secondary | ICD-10-CM | POA: Diagnosis not present

## 2020-02-26 DIAGNOSIS — Z1509 Genetic susceptibility to other malignant neoplasm: Secondary | ICD-10-CM

## 2020-02-26 DIAGNOSIS — C50912 Malignant neoplasm of unspecified site of left female breast: Secondary | ICD-10-CM | POA: Diagnosis not present

## 2020-02-26 LAB — CBC WITH DIFFERENTIAL/PLATELET
Abs Immature Granulocytes: 0.02 10*3/uL (ref 0.00–0.07)
Basophils Absolute: 0 10*3/uL (ref 0.0–0.1)
Basophils Relative: 1 %
Eosinophils Absolute: 0.1 10*3/uL (ref 0.0–0.5)
Eosinophils Relative: 5 %
HCT: 35.5 % — ABNORMAL LOW (ref 36.0–46.0)
Hemoglobin: 11.7 g/dL — ABNORMAL LOW (ref 12.0–15.0)
Immature Granulocytes: 1 %
Lymphocytes Relative: 29 %
Lymphs Abs: 0.6 10*3/uL — ABNORMAL LOW (ref 0.7–4.0)
MCH: 28.4 pg (ref 26.0–34.0)
MCHC: 33 g/dL (ref 30.0–36.0)
MCV: 86.2 fL (ref 80.0–100.0)
Monocytes Absolute: 0 10*3/uL — ABNORMAL LOW (ref 0.1–1.0)
Monocytes Relative: 2 %
Neutro Abs: 1.4 10*3/uL — ABNORMAL LOW (ref 1.7–7.7)
Neutrophils Relative %: 62 %
Platelets: 130 10*3/uL — ABNORMAL LOW (ref 150–400)
RBC: 4.12 MIL/uL (ref 3.87–5.11)
RDW: 12.4 % (ref 11.5–15.5)
WBC: 2.2 10*3/uL — ABNORMAL LOW (ref 4.0–10.5)
nRBC: 0 % (ref 0.0–0.2)

## 2020-02-26 LAB — COMPREHENSIVE METABOLIC PANEL
ALT: 12 U/L (ref 0–44)
AST: 14 U/L — ABNORMAL LOW (ref 15–41)
Albumin: 3.8 g/dL (ref 3.5–5.0)
Alkaline Phosphatase: 113 U/L (ref 38–126)
Anion gap: 11 (ref 5–15)
BUN: 8 mg/dL (ref 6–20)
CO2: 28 mmol/L (ref 22–32)
Calcium: 9.1 mg/dL (ref 8.9–10.3)
Chloride: 101 mmol/L (ref 98–111)
Creatinine, Ser: 0.73 mg/dL (ref 0.44–1.00)
GFR, Estimated: >60 mL/min (ref >60)
Glucose, Bld: 129 mg/dL — ABNORMAL HIGH (ref 70–99)
Potassium: 3.3 mmol/L — ABNORMAL LOW (ref 3.5–5.1)
Sodium: 140 mmol/L (ref 135–145)
Total Bilirubin: 0.9 mg/dL (ref 0.3–1.2)
Total Protein: 7.6 g/dL (ref 6.5–8.1)

## 2020-02-26 NOTE — Progress Notes (Signed)
[facility]  Telephone:([phone]) [phone] Fax:([phone]) [phone]     ID: Yulia Ulrich DOB: 1970-12-04  MR#: [id number]  JYN#:[id number]  Patient Care Team: [name], MD as PCP - General (Internal Medicine) Forrestine Lecrone, [name], MD as Consulting Physician (Oncology) [name], MD as Attending Physician (Radiation Oncology) Delice Bison Charlestine Massed, NP as Nurse Practitioner (Hematology and Oncology) Rolm Bookbinder, MD as Consulting Physician (General Surgery) Claudia Desanctis Steffanie Dunn, MD as Consulting Physician (Plastic Surgery) Mauro Kaufmann, RN as Oncology Nurse Navigator Rockwell Germany, RN as Oncology Nurse Navigator Milus Banister, MD as Attending Physician (Gastroenterology) OTHER MD:   CHIEF COMPLAINT: Triple negative breast cancer; BRCA2 positive  CURRENT TREATMENT: adjuvant chemotherapy (CMF)   INTERVAL HISTORY: Aunesti returns today for a follow-up and treatment of her triple negative breast cancer accompanied by her husband [name].   She began adjuvant chemotherapy at her last visit on 02/19/2020, consisting of cyclophosphamide, methotrexate, fluorouracil (CMF) given every 21 days x 8.  Today is day 8 cycle 1  Her bilateral salpingo-oophorectomy and hysterectomy has been postponed until after the chemotherapy has been completed.   REVIEW OF SYSTEMS: Anabia did generally well with her first cycle of treatment.  She really did not notice much change she says she had slight constipation for 2 days.  She took a little more Senokot and that took care of it.  She had a mild headache on day 1 which she treated with Tylenol.  During one of the chemo infusions, most likely Cytoxan, she had some tearing and some runny nose.  That lasted about a day she says.  She has had slight alteration in taste and a little bit of a drop in her appetite.  She is trying to walk most days 30 to 45 minutes.  She tells me that the students at [facility] when they found  out that she had cancer met her at the entrance of one of the games and she had a great seat and she said it was good for her daughter Deetta Perla to see her mother looking normal and a normal situation.   COVID 19 VACCINATION STATUS: not vaccinated (as of 02/12/2020)   BREAST CANCER HISTORY: From the original intake note:  The patient was seen in the emergency room 05/16/2016 with nonspecific complaints but also noting that she had a tender lump in her lateral left breast which she said she had noted the day before. Exam by the emergency room physician confirmed a 3 cm soft left lateral breast mass without overlying erythema or nipple changes. This was felt to be most consistent with fibrocystic change but the patient was referred back to her primary care physician for further evaluation. She saw the physician assistant in May 18 and then Dr. Wynetta Emery on 06/12/2016 who scheduled a bilateral diagnostic mammography with tomography and left breast ultrasonography at the [facility] 06/18/2016. This found the breast density to be category B. In the left breast upper outer quadrant there was an area of asymmetry measuring up to 10.3 cm. On exam this was firm and palpable and on ultrasound there was an irregular mass measuring 2.5 cm with some subtle changes in the surrounding tissue. In the left lower axilla there were 2 suspicious-looking lymph nodes.  Biopsy of the left breast upper outer quadrant and one of the suspicious lymph nodes 06/19/2016 showed ([id number]) both to be involved by invasive ductal carcinoma, grade 3, estrogen and progesterone receptor negative, HER-2 not amplified with  a signals ratio being 1.53-1.74 and the number per cell 2.35-3.05. The MIB-1 was 90%.  The patient's subsequent history is as detailed below.   PAST MEDICAL HISTORY: Past Medical History:  Diagnosis Date  . Arthritis   . Breast cancer (HCC)    Left Breast Cancer  . Breast cancer (HCC) 11/24/2019   left   .  Complication of anesthesia    DAY SURGERY 2010 OR 2011 ASPIRATED AND STAYED OVERNIGHT  . History of radiation therapy 03/11/17- 04/21/17   Left breast, 2 Gy in 25 fractions for a total dose of 50 Gy, Boost, 2 Gy in 5 fractions for a total dose of 10 Gy  . Hypertension    OFF MEDS  SINCE CHEMO X 5 MONTHS  . Malignant neoplasm of upper-outer quadrant of left breast in female, estrogen receptor negative (HCC) 06/23/2016  . Personal history of chemotherapy 2019   Left Breast Cancer  07-2016-12-2016  . Personal history of radiation therapy 2019   Left Breast Cancer    PAST SURGICAL HISTORY: Past Surgical History:  Procedure Laterality Date  . ANKLE SURGERY    . BREAST LUMPECTOMY Left 01/27/2017  . BREAST LUMPECTOMY WITH RADIOACTIVE SEED AND SENTINEL LYMPH NODE BIOPSY Left 01/27/2017   Procedure: LEFT BREAST LUMPECTOMY WITH RADIOACTIVE SEED LOCALIZATION, LEFT AXILLARY DEEP LYMPH NODE BIOPSY WITH RADIOACTIVE SEED LOCALIZATION, LEFT AXILLARY SENTINEL LYMPH NODE MAPPING AND BIOPSY WITH BLUE DYE INJECTION;  Surgeon: Claud Kelp, MD;  Location: [facility] OR;  Service: General;  Laterality: Left;  . COLONOSCOPY WITH PROPOFOL N/A 02/01/2020   Procedure: COLONOSCOPY WITH PROPOFOL;  Surgeon: Rachael Fee, MD;  Location: [facility];  Service: Endoscopy;  Laterality: N/A;  . ENDOSCOPIC MUCOSAL RESECTION  02/01/2020   Procedure: ENDOSCOPIC MUCOSAL RESECTION;  Surgeon: Rachael Fee, MD;  Location: [facility];  Service: Endoscopy;;  . HEMOSTASIS CLIP PLACEMENT  02/01/2020   Procedure: HEMOSTASIS CLIP PLACEMENT;  Surgeon: Rachael Fee, MD;  Location: [facility];  Service: Endoscopy;;  . IR FLUORO GUIDE PORT INSERTION RIGHT  07/14/2016  . IR US GUIDE VASC ACCESS RIGHT  07/14/2016  . MASTECTOMY W/ SENTINEL NODE BIOPSY Bilateral 01/09/2020   Procedure: BILATERAL MASTECTOMY WITH LEFT AXILLARY SENTINEL LYMPH NODE BIOPSY;  Surgeon: Emelia Loron, MD;  Location: [facility];  Service: General;   Laterality: Bilateral;  . PORT-A-CATH REMOVAL Right 01/27/2017   Procedure: REMOVAL PORT-A-CATH;  Surgeon: Claud Kelp, MD;  Location: [facility] OR;  Service: General;  Laterality: Right;  . PORTACATH PLACEMENT Right 01/09/2020   Procedure: INSERTION PORT-A-CATH;  Surgeon: Emelia Loron, MD;  Location: [facility];  Service: General;  Laterality: Right;  . SUBMUCOSAL LIFTING INJECTION  02/01/2020   Procedure: SUBMUCOSAL LIFTING INJECTION;  Surgeon: Rachael Fee, MD;  Location: [facility];  Service: Endoscopy;;  . TUBAL LIGATION    . WISDOM TOOTH EXTRACTION      FAMILY HISTORY Family History  Problem Relation Age of Onset  . Diabetes Mother   . Hypertension Mother   . Arthritis Mother   . Colon cancer Father 54       died .76 metastatic at time of diagnosis  . Diabetes Father   . Heart disease Father   . Hypertension Father   . Breast cancer Maternal Grandmother 56       d.60s  . Breast cancer Maternal Aunt 52  . Cervical cancer Maternal Aunt 55  . Breast cancer Maternal Aunt 46       d.50s  . Cancer  Maternal Uncle        d.62s unspecified type of cancer  . Breast cancer Cousin 4       paternal first-cousin (daughter of unaffected aunt)  . Cancer Maternal Aunt 16       "Female Cancer"  . Cancer Maternal Aunt        unknown cancer  . Brain cancer Daughter 39  . Esophageal cancer Other   . Colon polyps Neg Hx   . Rectal cancer Neg Hx   . Stomach cancer Neg Hx   The patient's father died at age 32 from colon cancer which she never had treated. The patient's mother is living, 52 years old as of June 2018. The patient has one brother, one sister. On the mother's side there is a history of breast cancer in the grandmother, age 53, and then 1 aunt with breast cancer age 65, and another with lung cancer age 71. There is no history of ovarian cancer in the family.   GYNECOLOGIC HISTORY:  No LMP recorded (lmp unknown). Patient is postmenopausal. Menarche age 1,  first live birth age 8. She is GX P4. Her periods have never been regular. She had a period in June 2018 but not for 4 months before that. She took birth control for some years remotely with no complications. She is status post bilateral tubal ligation.   SOCIAL HISTORY:  The patient's husband owns and runs a food truck.. They serve mostly [place] and [place] food. 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 Her husband Mila Pair (goes by "[name]") is originally from [name]. The patient has two children from her first marriage. Her son [name], who is 57 years old as of Oct 2021, was previously in the [employer], but now is taking courses at 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 scholarship, doing premed) and 46 as of October 2020    ADVANCED DIRECTIVES: In the absence of any documents to the contrary the patient's husband is her healthcare power of attorney   HEALTH MAINTENANCE: Social History   Tobacco Use  . Smoking status: Never Smoker  . Smokeless tobacco: Never Used  Vaping Use  . Vaping Use: Never used  Substance Use Topics  . Alcohol use: No  . Drug use: No     Colonoscopy: 02/01/2020  BPZ:[facility] 2018  Bone density:Never   Allergies  Allergen Reactions  . Carboplatin Other (See Comments)    Red, blotchy, hot    Current Outpatient Medications  Medication Sig Dispense Refill  . acetaminophen (TYLENOL) 325 MG tablet Take 650 mg by mouth 3 (three) times daily.    Marland Kitchen gabapentin (NEURONTIN) 100 MG capsule Take 200 mg by mouth 2 (two) times daily.    Marland Kitchen gabapentin (NEURONTIN) 300 MG capsule TAKE 2 CAPSULES (600 MG TOTAL) BY MOUTH  AT BEDTIME. 90 capsule 4  . lidocaine-prilocaine (EMLA) cream Apply to affected area once 30 g 3  . methocarbamol (ROBAXIN) 500 MG tablet Take 1 tablet (500 mg total) by mouth every 6 (six) hours as needed for muscle spasms. 30 tablet 1  . naproxen sodium (ALEVE) 220 MG tablet Take 220 mg by mouth 3 (three)  times daily.    . prochlorperazine (COMPAZINE) 10 MG tablet Take 1 tablet (10 mg total) by mouth every 6 (six) hours as needed (Nausea or vomiting). 30 tablet 1  . senna (SENOKOT) 8.6 MG tablet Take 2 tablets by mouth daily.    . traMADol (ULTRAM) 50 MG tablet Take 1-2 tablets (50-100 mg total) by mouth every 6 (six) hours as needed. 60 tablet 0  . venlafaxine XR (EFFEXOR-XR) 75 MG 24 hr capsule Take 1 capsule (75 mg total) by mouth daily with breakfast. 60 capsule 6   No current facility-administered medications for this visit.    OBJECTIVE: White woman who appears well  Vitals:   02/26/20 1214  BP: 108/80  Pulse: 86  Resp: 18  Temp: 97.9 F (36.6 C)  SpO2: 99%     Body mass index is 40.43 kg/m.   Filed Weights   02/26/20 1214  Weight: 262 lb (118.8 kg)   ECOG FS:1 - Symptomatic but completely ambulatory  Sclerae unicteric, EOMs intact Wearing a mask No cervical or supraclavicular adenopathy Lungs no rales or rhonchi Heart regular rate and rhythm Abd soft, nontender, positive bowel sounds MSK no focal spinal tenderness, no upper extremity lymphedema Neuro: nonfocal, well oriented, appropriate affect Breasts: Status post bilateral mastectomies, with dehiscence on the left.  The base looks yellow to pink.  It is not much different from prior.  There is no evidence of infection or inflammation.   LAB RESULTS:  CMP     Component Value Date/Time   NA 140 02/12/2020 0820   NA 140 01/07/2017 1342   K 3.1 (L) 02/12/2020 0820   K 3.5 01/07/2017 1342   CL 100 02/12/2020 0820   CO2 29 02/12/2020 0820   CO2 27 01/07/2017 1342   GLUCOSE 106 (H) 02/12/2020 0820   GLUCOSE  148 (H) 01/07/2017 1342   BUN 8 02/12/2020 0820   BUN 6.2 (L) 01/07/2017 1342   CREATININE 0.82 02/12/2020 0820   CREATININE 0.79 12/28/2019 1322   CREATININE 0.7 01/07/2017 1342   CALCIUM 9.0 02/12/2020 0820   CALCIUM 9.4 01/07/2017 1342   PROT 7.7 02/12/2020 0820   PROT 7.0 01/07/2017 1342   ALBUMIN 4.0 02/12/2020 0820   ALBUMIN 3.7 01/07/2017 1342   AST 27 02/12/2020 0820   AST 20 12/28/2019 1322   AST 23 01/07/2017 1342   ALT 23 02/12/2020 0820   ALT 19 12/28/2019 1322   ALT 24 01/07/2017 1342   ALKPHOS 92 02/12/2020 0820   ALKPHOS 105 01/07/2017 1342   BILITOT 0.8 02/12/2020 0820   BILITOT 0.9 12/28/2019 1322   BILITOT 0.84 01/07/2017 1342   GFRNONAA >60 02/12/2020 0820   GFRNONAA >60 12/28/2019 1322   GFRAA >60 05/23/2019 1921   GFRAA >60 10/13/2018 1346    No results found for: TOTALPROTELP, ALBUMINELP, A1GS, A2GS, BETS, BETA2SER, GAMS, MSPIKE, SPEI  No results found for: KPAFRELGTCHN, LAMBDASER, KAPLAMBRATIO  Lab Results  Component Value Date   WBC 2.2 (L) 02/26/2020   NEUTROABS 1.4 (L) 02/26/2020   HGB 11.7 (L) 02/26/2020   HCT 35.5 (L) 02/26/2020   MCV 86.2 02/26/2020   PLT 130 (L) 02/26/2020      Chemistry      Component Value Date/Time   NA 140 02/12/2020 0820   NA 140 01/07/2017 1342   K 3.1 (L) 02/12/2020 0820   K 3.5 01/07/2017 1342   CL 100 02/12/2020 0820   CO2 29 02/12/2020 0820   CO2 27 01/07/2017 1342  BUN 8 02/12/2020 0820   BUN 6.2 (L) 01/07/2017 1342   CREATININE 0.82 02/12/2020 0820   CREATININE 0.79 12/28/2019 1322   CREATININE 0.7 01/07/2017 1342      Component Value Date/Time   CALCIUM 9.0 02/12/2020 0820   CALCIUM 9.4 01/07/2017 1342   ALKPHOS 92 02/12/2020 0820   ALKPHOS 105 01/07/2017 1342   AST 27 02/12/2020 0820   AST 20 12/28/2019 1322   AST 23 01/07/2017 1342   ALT 23 02/12/2020 0820   ALT 19 12/28/2019 1322   ALT 24 01/07/2017 1342   BILITOT 0.8 02/12/2020 0820   BILITOT 0.9 12/28/2019 1322   BILITOT 0.84  01/07/2017 1342       No results found for: LABCA2  No components found for: [id number]  No results for input(s): INR in the last 168 hours.  Urinalysis    Component Value Date/Time   COLORURINE YELLOW 12/05/2019 1154   APPEARANCEUR CLOUDY (A) 12/05/2019 1154   LABSPEC 1.024 12/05/2019 1154   LABSPEC 1.025 11/23/2016 1251   PHURINE 5.0 12/05/2019 1154   GLUCOSEU NEGATIVE 12/05/2019 1154   GLUCOSEU Negative 11/23/2016 1251   HGBUR SMALL (A) 12/05/2019 1154   BILIRUBINUR NEGATIVE 12/05/2019 1154   BILIRUBINUR Negative 11/23/2016 1251   KETONESUR NEGATIVE 12/05/2019 1154   PROTEINUR NEGATIVE 12/05/2019 1154   UROBILINOGEN 0.2 11/23/2016 1251   NITRITE NEGATIVE 12/05/2019 1154   LEUKOCYTESUR NEGATIVE 12/05/2019 1154   LEUKOCYTESUR Negative 11/23/2016 1251    STUDIES: DG Abd 2 Views  Result Date: 02/01/2020 CLINICAL DATA:  50 year old female status post colonoscopy this morning with polyp resection and mucosal clipping. Query pneumoperitoneum. EXAM: ABDOMEN - 2 VIEW COMPARISON:  CT Abdomen and Pelvis 01/18/2019. FINDINGS: Portable semi upright and supine views of the abdomen and pelvis. A cluster of surgical clips are demonstrated along the ascending colon right lateral abdomen. Paucity of bowel gas throughout the abdomen and pelvis, nonobstructed pattern. No pneumoperitoneum is identified. Both lung bases appear negative. No acute osseous abnormality identified. Incidental pelvic phleboliths. IMPRESSION: 1. No pneumoperitoneum identified. If suspicion persists a left side down decubitus portable radiograph or Abdomen CT would be most sensitive. 2. Clustered metal clips along the right colon. Normal bowel gas pattern. Electronically Signed   By: Genevie Ann M.D.   On: 02/01/2020 08:53     ELIGIBLE FOR AVAILABLE RESEARCH PROTOCOL: no   ASSESSMENT: 50 y.o. [place] woman status post left breast upper outer quadrant and left axillary lymph node biopsy 06/19/2016, both positive for a  clinical T2-T3 N1, stage IIIB-C invasive ductal carcinoma, grade 3, triple negative, with an MIB-1 of 90%  (1) genetics testing 08/04/2016 showed a variant of uncertain significance in the BRCA2 namely c.8169T>A (p.[id number]). 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 Additional testing of the patient's mother and maternal aunt is pending. Otherwise [facility]'s Common Hereditary Cancers Panel found no deleterious mutations in APC, ATM, AXIN2, BARD1, BMPR1A, BRCA1, BRCA2, BRIP1, CDH1, CDKN2A, CHEK2, CTNNA1, DICER1, EPCAM, GREM1, HOXB13, KIT, MEN1, MLH1, MSH2, MSH3, MSH6, MUTYH, NBN, NF1, NTHL1, PALB2, PDGFRA, PMS2, POLD1, POLE, PTEN, RAD50, RAD51C, RAD51D, SDHA, SDHB, SDHC, SDHD, SMAD4, SMARCA4, STK11, TP53, TSC1, TSC2, and VHL.  (2) neoadjuvant chemotherapy consisting of doxorubicin and cyclophosphamide in dose dense fashion 4 completed 09/10/2016 followed by paclitaxel weekly 12 given with carboplatin   (a) cycle 4 of cyclophosphamide and doxorubicin was delayed 10 days and dose decreased 10% because of febrile neutropenia after cycle 3  (b) cycle 6 of  Paclitaxel and Carboplatin delayed due to neutropenia, therefore Granix added to Wednesday, Thursday, Friday following chemotherapy days  (3) status post left lumpectomy and left axillary sentinel lymph node sampling 01/27/2017 with pathology showing a complete pathologic response (ypT0 ypN0);   (a) 5 left axillary lymph nodes removed  (4) adjuvant radiation 03/11/2017-04/21/2017 Site/dose:   1. Left breast, 2 Gy in 25 fractions for a total dose of 50 Gy                      2. 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  (8) status post bilateral mastectomies 01/09/2020 showing  (a) on the right, no evidence of malignancy  (b) on the left, a pT2 pNX, stage IIB invasive ductal carcinoma, grade 3, with ample margins   (c) repeat prognostic panel on the left again weakly estrogen receptor positive, HER2 negative, progesterone receptor negative  (9) adjuvant therapy with cyclophosphamide, methotrexate, fluorouracil (CMF) given every 21 days x 8 to start 02/19/2020  (10) olaparib, adjuvant estrogens to follow  (11) foundation 1 requested on 01/09/2020 material found in addition to the BRCA2 mutation, a 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  ERV B2 and PI K3 CA were negative, and the microsatellite status was stable, with low mutational burden (4/MB)   PLAN: Cana did very well with her first cycle of CMF.  I think the reaction she had to very likely the Cytoxan (it was the infusion drug she said) may or may not recur.  If it does I suppose we can pretreat with increasing antihistamines.  In any case she did very well and her counts today are reassuring.  I do not think she will need a booster shot on day 3.  Of course we will follow her counts with each subsequent treatment and if her counts do drop eventually we might have to do that  We will see her on each treatment day from this point on.  Incidentally her daughter would like to do an internship here.  I have suggested that she get certified through the volunteer process.  If she runs into a bureaucratic tangle she will let me  know.  Encounter time 25 minutes.  Lisl Slingerland, [name], MD  02/26/20 12:21 PM Medical Oncology and Hematology [facility] [place], [place] [address] Tel. [phone]    Fax. [phone]   I, Wilburn Mylar, am acting as scribe for Dr. [name]. Shawna Kiener.  I, Lurline Del MD, have reviewed the above documentation for accuracy and completeness, and I agree with the above.   *Total Encounter Time as defined by the Centers for Medicare and Medicaid Services includes, in addition to the face-to-face time of a patient visit (documented in the note above) non-face-to-face time: obtaining and reviewing outside history, ordering and reviewing medications,  tests or procedures, care coordination (communications with other health care professionals or caregivers) and documentation in the medical record.

## 2020-02-27 ENCOUNTER — Ambulatory Visit: Payer: Medicaid Other

## 2020-02-28 ENCOUNTER — Telehealth: Payer: Self-pay | Admitting: Oncology

## 2020-02-28 NOTE — Telephone Encounter (Signed)
Scheduled office visit between appts that were already on pt's schedule. Made not changes to pt's original arrival time.

## 2020-02-29 ENCOUNTER — Ambulatory Visit: Payer: Medicaid Other | Admitting: Rehabilitation

## 2020-02-29 MED FILL — traMADol HCL 50 MG TABS: 50 | 2 days supply | Qty: 20 | Fill #1

## 2020-02-29 MED FILL — GABAPENTIN 300 MG CAPSULE: 300 | 45 days supply | Qty: 90 | Fill #4

## 2020-03-04 ENCOUNTER — Telehealth: Payer: Self-pay

## 2020-03-04 ENCOUNTER — Encounter: Payer: Self-pay | Admitting: Oncology

## 2020-03-04 ENCOUNTER — Other Ambulatory Visit: Payer: Self-pay | Admitting: Oncology

## 2020-03-04 MED ORDER — DOXYCYCLINE HYCLATE 100 MG PO TABS: 100.0000 mg | ORAL_TABLET | Freq: Two times a day (BID) | ORAL | 1 refills | Status: DC

## 2020-03-04 MED FILL — DOXYCYCLINE HYCLATE 100 MG: 100 | 10 days supply | Qty: 20 | Fill #0

## 2020-03-04 NOTE — Telephone Encounter (Signed)
Pt contacted to let her know that a prescription for doxycycline was called in for her at the [facility]. Pt verbalized understanding.

## 2020-03-07 ENCOUNTER — Other Ambulatory Visit: Payer: Self-pay | Admitting: Oncology

## 2020-03-07 MED FILL — AMOX TR-K CLV 875-125 MG TA: 875-125 | 7 days supply | Qty: 14 | Fill #0

## 2020-03-08 MED FILL — Dexamethasone Sodium Phosphate Inj 100 MG/10ML: INTRAMUSCULAR | Qty: 1 | Status: AC

## 2020-03-10 NOTE — Progress Notes (Unsigned)
[place]  [phone] Fax:([phone]) [phone]     ID: Wells Gerdeman DOB: 03-19-1970  MR#: [id number]  PHX#:[id number]  Patient Care Team: [name], MD as PCP - General (Internal Medicine) Magrinat, [name], MD as Consulting Physician (Oncology) [name], MD as Attending Physician (Radiation Oncology) Delice Bison Charlestine Massed, NP as Nurse Practitioner (Hematology and Oncology) Rolm Bookbinder, MD as Consulting Physician (General Surgery) Claudia Desanctis Steffanie Dunn, MD as Consulting Physician (Plastic Surgery) Mauro Kaufmann, RN as Oncology Nurse Navigator Rockwell Germany, RN as Oncology Nurse Navigator Milus Banister, MD as Attending Physician (Gastroenterology) OTHER MD:   CHIEF COMPLAINT: Triple negative breast cancer; BRCA2 positive  CURRENT TREATMENT: adjuvant chemotherapy (CMF)   INTERVAL HISTORY: Cariann returns today for a follow-up and treatment of her triple negative breast cancer accompanied by her husband [name].   She had been receiving cyclophosphamide, methotrexate, fluorouracil (CMF) given every 21 days x 8.  The first cycle was delayed a week because of wound dehiscence. Shw was scheduled for cycle #2 today but in the interim developed significant erythema and drainage from her dehisced area in the left chest wall.  We started her on doxycycline on 03/04/2020.  She took that for several days but when she saw [name] on 03/07/2020 was really not much better so she was switched to Augmentin at that time.  She has noted improvement, with more range of motion in the left upper extremity and decreased erythema, but clearly is not out of the woods yet.   Dr Donne Hazel felt and I agree it would be a better idea to give her a little more time for the wound to heal. Accordingly we are holding chemotherapy at this time and   Her bilateral salpingo-oophorectomy and hysterectomy has been postponed until after the chemotherapy has been  completed.  Note however that she has had no menstrual periods since the earlier chemotherapy she had for her first breast cancer.   REVIEW OF SYSTEMS: Xoie is still having temperatures but there are less marked.  They are approximately 99-100.5.  She still does not feel very well.  She is understandably anxious and wonders if she is going to need more surgery to debride and clean out the area.  Note she is not having any problems with hot flashes at present.  A detailed review of systems today was otherwise stable  COVID 19 VACCINATION STATUS: not vaccinated (as of 02/12/2020)   BREAST CANCER HISTORY: From the original intake note:  The patient was seen in the emergency room 05/16/2016 with nonspecific complaints but also noting that she had a tender lump in her lateral left breast which she said she had noted the day before. Exam by the emergency room physician confirmed a 3 cm soft left lateral breast mass without overlying erythema or nipple changes. This was felt to be most consistent with fibrocystic change but the patient was referred back to her primary care physician for further evaluation. She saw the physician assistant in May 18 and then Dr. Wynetta Emery on 06/12/2016 who scheduled a bilateral diagnostic mammography with tomography and left breast ultrasonography at the [facility] 06/18/2016. This found the breast density to be category B. In the left breast upper outer quadrant there was an area of asymmetry measuring up to 10.3 cm. On exam this was firm and palpable and on ultrasound there was an irregular mass measuring 2.5 cm with some subtle changes in the surrounding tissue. In the left lower axilla  there were 2 suspicious-looking lymph nodes.  Biopsy of the left breast upper outer quadrant and one of the suspicious lymph nodes 06/19/2016 showed ([id number]) both to be involved by invasive ductal carcinoma, grade 3, estrogen and progesterone receptor negative, HER-2 not amplified with  a signals ratio being 1.53-1.74 and the number per cell 2.35-3.05. The MIB-1 was 90%.  The patient's subsequent history is as detailed below.   PAST MEDICAL HISTORY: Past Medical History:  Diagnosis Date  . Arthritis   . Breast cancer ([place])    Left Breast Cancer  . Breast cancer ([place]) 11/24/2019   left   . Complication of anesthesia    DAY SURGERY 2010 OR 2011 ASPIRATED AND STAYED OVERNIGHT  . History of radiation therapy 03/11/17- 04/21/17   Left breast, 2 Gy in 25 fractions for a total dose of 50 Gy, Boost, 2 Gy in 5 fractions for a total dose of 10 Gy  . Hypertension    OFF MEDS  SINCE CHEMO X 5 MONTHS  . Malignant neoplasm of upper-outer quadrant of left breast in female, estrogen receptor negative ([place]) 06/23/2016  . Personal history of chemotherapy 2019   Left Breast Cancer  07-2016-12-2016  . Personal history of radiation therapy 2019   Left Breast Cancer    PAST SURGICAL HISTORY: Past Surgical History:  Procedure Laterality Date  . ANKLE SURGERY    . BREAST LUMPECTOMY Left 01/27/2017  . BREAST LUMPECTOMY WITH RADIOACTIVE SEED AND SENTINEL LYMPH NODE BIOPSY Left 01/27/2017   Procedure: LEFT BREAST LUMPECTOMY WITH RADIOACTIVE SEED LOCALIZATION, LEFT AXILLARY DEEP LYMPH NODE BIOPSY WITH RADIOACTIVE SEED LOCALIZATION, LEFT AXILLARY SENTINEL LYMPH NODE MAPPING AND BIOPSY WITH BLUE DYE INJECTION;  Surgeon: [name], MD;  Location: [facility];  Service: General;  Laterality: Left;  . COLONOSCOPY WITH PROPOFOL N/A 02/01/2020   Procedure: COLONOSCOPY WITH PROPOFOL;  Surgeon: Milus Banister, MD;  Location: [facility];  Service: Endoscopy;  Laterality: N/A;  . ENDOSCOPIC MUCOSAL RESECTION  02/01/2020   Procedure: ENDOSCOPIC MUCOSAL RESECTION;  Surgeon: Milus Banister, MD;  Location: [facility];  Service: Endoscopy;;  . HEMOSTASIS CLIP PLACEMENT  02/01/2020   Procedure: HEMOSTASIS CLIP PLACEMENT;  Surgeon: Milus Banister, MD;  Location: [facility];  Service: Endoscopy;;  . IR  FLUORO GUIDE PORT INSERTION RIGHT  07/14/2016  . IR US GUIDE VASC ACCESS RIGHT  07/14/2016  . MASTECTOMY W/ SENTINEL NODE BIOPSY Bilateral 01/09/2020   Procedure: BILATERAL MASTECTOMY WITH LEFT AXILLARY SENTINEL LYMPH NODE BIOPSY;  Surgeon: Rolm Bookbinder, MD;  Location: [facility];  Service: General;  Laterality: Bilateral;  . PORT-A-CATH REMOVAL Right 01/27/2017   Procedure: REMOVAL PORT-A-CATH;  Surgeon: [name], MD;  Location: [facility];  Service: General;  Laterality: Right;  . PORTACATH PLACEMENT Right 01/09/2020   Procedure: INSERTION PORT-A-CATH;  Surgeon: Rolm Bookbinder, MD;  Location: [facility];  Service: General;  Laterality: Right;  . SUBMUCOSAL LIFTING INJECTION  02/01/2020   Procedure: SUBMUCOSAL LIFTING INJECTION;  Surgeon: Milus Banister, MD;  Location: [facility];  Service: Endoscopy;;  . TUBAL LIGATION    . WISDOM TOOTH EXTRACTION      FAMILY HISTORY Family History  Problem Relation Age of Onset  . Diabetes Mother   . Hypertension Mother   . Arthritis Mother   . Colon cancer Father 47       died .76 metastatic at time of diagnosis  . Diabetes Father   . Heart disease Father   . Hypertension Father   .  Breast cancer Maternal Grandmother 77       d.60s  . Breast cancer Maternal Aunt 52  . Cervical cancer Maternal Aunt 68  . Breast cancer Maternal Aunt 46       d.50s  . Cancer Maternal Uncle        d.62s unspecified type of cancer  . Breast cancer Cousin 58       paternal first-cousin (daughter of unaffected aunt)  . Cancer Maternal Aunt 59       "Female Cancer"  . Cancer Maternal Aunt        unknown cancer  . Brain cancer Daughter 69  . Esophageal cancer Other   . Colon polyps Neg Hx   . Rectal cancer Neg Hx   . Stomach cancer Neg Hx   The patient's father died at age 46 from colon cancer which she never had treated. The patient's mother is living, 5 years old as of June 2018. The patient has one brother, one sister.  On the mother's side there is a history of breast cancer in the grandmother, age 40, and then 1 aunt with breast cancer age 44, and another with lung cancer age 13. There is no history of ovarian cancer in the family.   GYNECOLOGIC HISTORY:  No LMP recorded (lmp unknown). Patient is postmenopausal. Menarche age 43, first live birth age 38. She is GX P4. Her periods have never been regular. She had a period in June 2018 but not for 4 months before that. She took birth control for some years remotely with no complications. She is status post bilateral tubal ligation.   SOCIAL HISTORY:  The patient's husband owns and runs a food truck.. They serve mostly [place] and [place] food. 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 Her husband Rheagan Nayak (goes by "[name]") is originally from [name]. The patient has two children from her first marriage. 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 scholarship, doing premed) and 66 as of October 2020    ADVANCED DIRECTIVES: In the absence of any documents to the contrary the patient's husband is her healthcare power of attorney   HEALTH MAINTENANCE: Social History   Tobacco Use  . Smoking status: Never Smoker  . Smokeless tobacco: Never Used  Vaping Use  . Vaping Use: Never used  Substance Use Topics  . Alcohol use: No  . Drug use: No     Colonoscopy: 02/01/2020  KQA:SUOR 2018  Bone density:Never   Allergies  Allergen Reactions  . Carboplatin Other (See Comments)    Red, blotchy, hot     Current Outpatient Medications  Medication Sig Dispense Refill  . acetaminophen (TYLENOL) 325 MG tablet Take 650 mg by mouth 3 (three) times daily.    Marland Kitchen doxycycline (VIBRA-TABS) 100 MG tablet Take 1 tablet (100 mg total) by mouth 2 (two) times daily. 20 tablet 1  . gabapentin (NEURONTIN) 100 MG capsule Take 200 mg by mouth 2 (two) times daily.    Marland Kitchen gabapentin (NEURONTIN) 300 MG capsule TAKE 2 CAPSULES (  600 MG TOTAL) BY MOUTH AT BEDTIME. 90 capsule 4  . lidocaine-prilocaine (EMLA) cream Apply to affected area once 30 g 3  . methocarbamol (ROBAXIN) 500 MG tablet Take 1 tablet (500 mg total) by mouth every 6 (six) hours as needed for muscle spasms. 30 tablet 1  . naproxen sodium (ALEVE) 220 MG tablet Take 220 mg by mouth 3 (three) times daily.    . prochlorperazine (COMPAZINE) 10 MG tablet Take 1 tablet (10 mg total) by mouth every 6 (six) hours as needed (Nausea or vomiting). 30 tablet 1  . senna (SENOKOT) 8.6 MG tablet Take 2 tablets by mouth daily.    . traMADol (ULTRAM) 50 MG tablet Take 1-2 tablets (50-100 mg total) by mouth every 6 (six) hours as needed. 60 tablet 0  . venlafaxine XR (EFFEXOR-XR) 75 MG 24 hr capsule Take 1 capsule (75 mg total) by mouth daily with breakfast. 60 capsule 6   No current facility-administered medications for this visit.    OBJECTIVE: White woman who appears stated age  50:   03/11/20 0854  BP: 116/69  Pulse: 93  Resp: 18  Temp: 97.9 F (36.6 C)  SpO2: 99%     Body mass index is 40.55 kg/m.   Filed Weights   03/11/20 0854  Weight: 262 lb 12.8 oz (119.2 kg)   ECOG FS:1 - Symptomatic but completely ambulatory  Sclerae unicteric, EOMs intact Wearing a mask No cervical or supraclavicular adenopathy Lungs no rales or rhonchi Heart regular rate and rhythm Abd soft, obese, nontender, positive bowel sounds MSK no focal spinal tenderness, no upper extremity lymphedema Neuro: nonfocal, well oriented, appropriate affect Breasts: Status post  bilateral mastectomies.  The wound on the left has dehisced.  The base is yellow.  There is a little bit of erythema around the wound, but much less than on the image that is in the patient's phone from a few days ago.  She is able to AB duct the left upper extremity to about 90 degrees.      LAB RESULTS:  CMP     Component Value Date/Time   NA 140 02/26/2020 1146   NA 140 01/07/2017 1342   K 3.3 (L) 02/26/2020 1146   K 3.5 01/07/2017 1342   CL 101 02/26/2020 1146   CO2 28 02/26/2020 1146   CO2 27 01/07/2017 1342   GLUCOSE 129 (H) 02/26/2020 1146   GLUCOSE 148 (H) 01/07/2017 1342   BUN 8 02/26/2020 1146   BUN 6.2 (L) 01/07/2017 1342   CREATININE 0.73 02/26/2020 1146   CREATININE 0.79 12/28/2019 1322   CREATININE 0.7 01/07/2017 1342   CALCIUM 9.1 02/26/2020 1146   CALCIUM 9.4 01/07/2017 1342   PROT 7.6 02/26/2020 1146   PROT 7.0 01/07/2017 1342   ALBUMIN 3.8 02/26/2020 1146   ALBUMIN 3.7 01/07/2017 1342   AST 14 (L) 02/26/2020 1146   AST 20 12/28/2019 1322   AST 23 01/07/2017 1342   ALT 12 02/26/2020 1146   ALT 19 12/28/2019 1322   ALT 24 01/07/2017 1342   ALKPHOS 113 02/26/2020 1146   ALKPHOS 105 01/07/2017 1342   BILITOT 0.9 02/26/2020 1146   BILITOT 0.9 12/28/2019 1322   BILITOT 0.84 01/07/2017 1342   GFRNONAA >60 02/26/2020 1146   GFRNONAA >60 12/28/2019 1322   GFRAA >60 05/23/2019 1921   GFRAA >60 10/13/2018 1346    No results found for: TOTALPROTELP, ALBUMINELP, A1GS, A2GS, BETS, BETA2SER, GAMS, MSPIKE, SPEI  No results found for: KPAFRELGTCHN, LAMBDASER, KAPLAMBRATIO  Lab Results  Component Value Date   WBC 5.0 03/11/2020   NEUTROABS 3.2 03/11/2020   HGB 10.2 (L) 03/11/2020   HCT 31.7 (L) 03/11/2020   MCV 86.8 03/11/2020   PLT 230 03/11/2020      Chemistry      Component Value Date/Time   NA 140 02/26/2020 1146   NA 140 01/07/2017 1342   K 3.3 (L) 02/26/2020 1146   K 3.5 01/07/2017 1342   CL 101 02/26/2020 1146   CO2 28 02/26/2020 1146    CO2 27 01/07/2017 1342   BUN 8 02/26/2020 1146   BUN 6.2 (L) 01/07/2017 1342   CREATININE 0.73 02/26/2020 1146   CREATININE 0.79 12/28/2019 1322   CREATININE 0.7 01/07/2017 1342      Component Value Date/Time   CALCIUM 9.1 02/26/2020 1146   CALCIUM 9.4 01/07/2017 1342   ALKPHOS 113 02/26/2020 1146   ALKPHOS 105 01/07/2017 1342   AST 14 (L) 02/26/2020 1146   AST 20 12/28/2019 1322   AST 23 01/07/2017 1342   ALT 12 02/26/2020 1146   ALT 19 12/28/2019 1322   ALT 24 01/07/2017 1342   BILITOT 0.9 02/26/2020 1146   BILITOT 0.9 12/28/2019 1322   BILITOT 0.84 01/07/2017 1342       No results found for: LABCA2  No components found for: [id number]  No results for input(s): INR in the last 168 hours.  Urinalysis    Component Value Date/Time   COLORURINE YELLOW 12/05/2019 1154   APPEARANCEUR CLOUDY (A) 12/05/2019 1154   LABSPEC 1.024 12/05/2019 1154   LABSPEC 1.025 11/23/2016 1251   PHURINE 5.0 12/05/2019 1154   GLUCOSEU NEGATIVE 12/05/2019 1154   GLUCOSEU Negative 11/23/2016 1251   HGBUR SMALL (A) 12/05/2019 1154   BILIRUBINUR NEGATIVE 12/05/2019 1154   BILIRUBINUR Negative 11/23/2016 1251   KETONESUR NEGATIVE 12/05/2019 1154   PROTEINUR NEGATIVE 12/05/2019 1154   UROBILINOGEN 0.2 11/23/2016 1251   NITRITE NEGATIVE 12/05/2019 1154   LEUKOCYTESUR NEGATIVE 12/05/2019 1154   LEUKOCYTESUR Negative 11/23/2016 1251    STUDIES: No results found.   ELIGIBLE FOR AVAILABLE RESEARCH PROTOCOL: no   ASSESSMENT: 50 y.o. [place] woman status post left breast upper outer quadrant and left axillary lymph node biopsy 06/19/2016, both positive for a clinical T2-T3 N1, stage IIIB-C invasive ductal carcinoma, grade 3, triple negative, with an MIB-1 of 90%  (1) genetics testing 08/04/2016 showed a variant of uncertain significance in the BRCA2 namely c.[id number]>A (p.[id number]). 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 Additional testing of the  patient's mother and maternal aunt is pending. Otherwise [facility]'s Common Hereditary Cancers Panel found no deleterious mutations in APC, ATM, AXIN2, BARD1, BMPR1A, BRCA1, BRCA2, BRIP1, CDH1, CDKN2A, CHEK2, CTNNA1, DICER1, EPCAM, GREM1, HOXB13, KIT, MEN1, MLH1, MSH2, MSH3, MSH6, MUTYH, NBN, NF1, NTHL1, PALB2, PDGFRA, PMS2, POLD1, POLE, PTEN, RAD50, RAD51C, RAD51D, SDHA, SDHB, SDHC, SDHD, SMAD4, SMARCA4, STK11, TP53, TSC1, TSC2, and VHL.  (2) neoadjuvant chemotherapy consisting of doxorubicin and cyclophosphamide in dose dense fashion 4 completed 09/10/2016 followed by paclitaxel weekly 12 given with carboplatin   (a) cycle 4 of cyclophosphamide and doxorubicin was delayed 10 days and dose decreased 10% because of febrile neutropenia after cycle 3  (b) cycle 6 of Paclitaxel and Carboplatin delayed due to neutropenia, therefore Granix added to Wednesday, Thursday, Friday following chemotherapy days  (3) status post left lumpectomy and left axillary sentinel lymph node sampling 01/27/2017 with pathology showing a complete pathologic response (ypT0 ypN0);   (  a) 5 left axillary lymph nodes removed  (4) adjuvant radiation 03/11/2017-04/21/2017 Site/dose:   1. Left breast, 2 Gy in 25 fractions for a total dose of 50 Gy                      2. 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  (8) status post bilateral mastectomies 01/09/2020 showing  (a) on the right, no evidence of malignancy  (b) on the left, a pT2 pNX, stage IIB invasive ductal carcinoma, grade 3, with ample margins   (c) repeat prognostic panel on the left again weakly estrogen receptor positive, HER2 negative, progesterone receptor negative  (9) adjuvant therapy with cyclophosphamide, methotrexate, fluorouracil (CMF) given every 21 days x 8, started 02/19/2020  (a) cycle 2 held due to surgical wound dehiscence  (10) olaparib, adjuvant estrogens to follow  (11) foundation 1 requested on 01/09/2020 material found in addition to the BRCA2 mutation, a 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  ERV B2 and PI K3 CA were negative, and the microsatellite status was stable, with low mutational burden (4/MB)  (12) anastrozole started 03/11/2020 pending resumptoionm of chemotherapy   PLAN: Alixandra's wound really needs to be dealt with before we can resume the CMF chemotherapy.  This may take just 2 weeks or so or it may take longer.  Just in case I am going to start her on anastrozole and today I gave her information on the possible toxicities side effects and complications of this agent.  She will start it today.  She understands anastrozole does not affect clotting so she does not have to stop it if she does have additional surgery.  She did lose some of her hair from the first cycle of chemo.  On the other hand she is tolerating Augmentin without any significant side effects.  I do think she is responding to antibiotics.  I am going to see her again in 3 weeks and if everything is considerably improved and surgery agrees we can resume chemotherapy at that time  Total encounter time 25 minutes.*  Magrinat, [name], MD  03/11/20 9:03 AM Medical Oncology and Hematology [facility] [place], [place]  [address] Tel. [phone]    Fax. [phone]   I, Wilburn Mylar, am acting as scribe for Dr. [name]. Magrinat.  I, Lurline Del MD, have reviewed the above documentation for accuracy and completeness, and I agree with the above.   *Total Encounter Time as defined by the Centers for Medicare and Medicaid Services includes, in addition to the face-to-face time of a patient visit (documented in the note above) non-face-to-face time: obtaining and reviewing outside history, ordering and reviewing medications, tests or procedures, care coordination (communications with other health care professionals or caregivers) and documentation in the medical record.

## 2020-03-10 NOTE — Progress Notes (Signed)
[facility]  [phone] Fax:([phone]     ID: [name] DOB: 05/05/1970  MR#: [id number]  JYN#:[id number]  Patient Care Team: [name], MD as PCP - General (Internal Medicine) Magrinat, [name], MD as Consulting Physician (Oncology) [name], MD as Attending Physician (Radiation Oncology) Delice Bison Charlestine Massed, NP as Nurse Practitioner (Hematology and Oncology) Rolm Bookbinder, MD as Consulting Physician (General Surgery) Claudia Desanctis Steffanie Dunn, MD as Consulting Physician (Plastic Surgery) Mauro Kaufmann, RN as Oncology Nurse Navigator Rockwell Germany, RN as Oncology Nurse Navigator Milus Banister, MD as Attending Physician (Gastroenterology) OTHER MD:   CHIEF COMPLAINT: Triple negative breast cancer; BRCA2 positive  CURRENT TREATMENT: adjuvant chemotherapy (CMF)   INTERVAL HISTORY: [name] returns today for a follow-up and treatment of her triple negative breast cancer accompanied by her husband [name].   She had been receiving cyclophosphamide, methotrexate, fluorouracil (CMF) given every 21 days x 8.  She is scheduled for cycle 2 today.  Her bilateral salpingo-oophorectomy and hysterectomy has been postponed until after the chemotherapy has been completed.   REVIEW OF SYSTEMS: Ori    COVID 19 VACCINATION STATUS: not vaccinated (as of 02/12/2020)   BREAST CANCER HISTORY: From the original intake note:  The patient was seen in the emergency room 05/16/2016 with nonspecific complaints but also noting that she had a tender lump in her lateral left breast which she said she had noted the day before. Exam by the emergency room physician confirmed a 3 cm soft left lateral breast mass without overlying erythema or nipple changes. This was felt to be most consistent with fibrocystic change but the patient was referred back to her primary care physician for further evaluation. She saw the physician assistant in May 18  and then Dr. Wynetta Emery on 06/12/2016 who scheduled a bilateral diagnostic mammography with tomography and left breast ultrasonography at the [facility] 06/18/2016. This found the breast density to be category B. In the left breast upper outer quadrant there was an area of asymmetry measuring up to 10.3 cm. On exam this was firm and palpable and on ultrasound there was an irregular mass measuring 2.5 cm with some subtle changes in the surrounding tissue. In the left lower axilla there were 2 suspicious-looking lymph nodes.  Biopsy of the left breast upper outer quadrant and one of the suspicious lymph nodes 06/19/2016 showed ([id number]) both to be involved by invasive ductal carcinoma, grade 3, estrogen and progesterone receptor negative, HER-2 not amplified with a signals ratio being 1.53-1.74 and the number per cell 2.35-3.05. The MIB-1 was 90%.  The patient's subsequent history is as detailed below.   PAST MEDICAL HISTORY: Past Medical History:  Diagnosis Date   Arthritis    Breast cancer ([place])    Left Breast Cancer   Breast cancer ([place]) 69/62/9528   left    Complication of anesthesia    DAY SURGERY 2010 OR 2011 ASPIRATED AND STAYED OVERNIGHT   History of radiation therapy 03/11/17- 04/21/17   Left breast, 2 Gy in 25 fractions for a total dose of 50 Gy, Boost, 2 Gy in 5 fractions for a total dose of 10 Gy   Hypertension    OFF MEDS  SINCE CHEMO X 5 MONTHS   Malignant neoplasm of upper-outer quadrant of left breast in female, estrogen receptor negative ([place]) 06/23/2016   Personal history of chemotherapy 2019   Left Breast Cancer  07-2016-12-2016   Personal history of radiation therapy 2019   Left  Breast Cancer    PAST SURGICAL HISTORY: Past Surgical History:  Procedure Laterality Date   ANKLE SURGERY     BREAST LUMPECTOMY Left 01/27/2017   BREAST LUMPECTOMY WITH RADIOACTIVE SEED AND SENTINEL LYMPH NODE BIOPSY Left 01/27/2017   Procedure: LEFT BREAST LUMPECTOMY WITH  RADIOACTIVE SEED LOCALIZATION, LEFT AXILLARY DEEP LYMPH NODE BIOPSY WITH RADIOACTIVE SEED LOCALIZATION, LEFT AXILLARY SENTINEL LYMPH NODE MAPPING AND BIOPSY WITH BLUE DYE INJECTION;  Surgeon: [name], MD;  Location: [facility];  Service: General;  Laterality: Left;   COLONOSCOPY WITH PROPOFOL N/A 02/01/2020   Procedure: COLONOSCOPY WITH PROPOFOL;  Surgeon: Milus Banister, MD;  Location: [facility];  Service: Endoscopy;  Laterality: N/A;   ENDOSCOPIC MUCOSAL RESECTION  02/01/2020   Procedure: ENDOSCOPIC MUCOSAL RESECTION;  Surgeon: Milus Banister, MD;  Location: [facility];  Service: Endoscopy;;   HEMOSTASIS CLIP PLACEMENT  02/01/2020   Procedure: HEMOSTASIS CLIP PLACEMENT;  Surgeon: Milus Banister, MD;  [facility]: [facility];  Service: Endoscopy;;   IR FLUORO GUIDE PORT INSERTION RIGHT  07/14/2016   IR US GUIDE VASC ACCESS RIGHT  07/14/2016   MASTECTOMY W/ SENTINEL NODE BIOPSY Bilateral 01/09/2020   Procedure: BILATERAL MASTECTOMY WITH LEFT AXILLARY SENTINEL LYMPH NODE BIOPSY;  Surgeon: Rolm Bookbinder, MD;  Location: [facility];  Service: General;  Laterality: Bilateral;   PORT-A-CATH REMOVAL Right 01/27/2017   Procedure: REMOVAL PORT-A-CATH;  Surgeon: [name], MD;  Location: [facility];  Service: General;  Laterality: Right;   PORTACATH PLACEMENT Right 01/09/2020   Procedure: INSERTION PORT-A-CATH;  Surgeon: Rolm Bookbinder, MD;  Location: [facility];  Service: General;  Laterality: Right;   SUBMUCOSAL LIFTING INJECTION  02/01/2020   Procedure: SUBMUCOSAL LIFTING INJECTION;  Surgeon: Milus Banister, MD;  Location: [facility];  Service: Endoscopy;;   TUBAL LIGATION     WISDOM TOOTH EXTRACTION      FAMILY HISTORY Family History  Problem Relation Age of Onset   Diabetes Mother    Hypertension Mother    Arthritis Mother    Colon cancer Father 87       died .46 metastatic at time of diagnosis   Diabetes Father    Heart disease  Father    Hypertension Father    Breast cancer Maternal Grandmother 64       d.69s   Breast cancer Maternal Aunt 52   Cervical cancer Maternal Aunt 55   Breast cancer Maternal Aunt 46       d.50s   Cancer Maternal Uncle        d.62s unspecified type of cancer   Breast cancer Cousin 51       paternal first-cousin (daughter of unaffected aunt)   Cancer Maternal Aunt 85       "Female Cancer"   Cancer Maternal Aunt        unknown cancer   Brain cancer Daughter 22   Esophageal cancer Other    Colon polyps Neg Hx    Rectal cancer Neg Hx    Stomach cancer Neg Hx   The patient's father died at age 65 from colon cancer which she never had treated. The patient's mother is living, 52 years old as of June 2018. The patient has one brother, one sister. On the mother's side there is a history of breast cancer in the grandmother, age 61, and then 1 aunt with breast cancer age 56, and another with lung cancer age 71. There is no history of ovarian cancer in the family.  GYNECOLOGIC HISTORY:  No LMP recorded (lmp unknown). Patient is postmenopausal. Menarche age 67, first live birth age 19. She is GX P4. Her periods have never been regular. She had a period in June 2018 but not for 4 months before that. She took birth control for some years remotely with no complications. She is status post bilateral tubal ligation.   SOCIAL HISTORY:  The patient's husband owns and runs a food truck.. They serve mostly [place] and [place] food. 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 Her husband [name] (goes by "[name]") is originally from [name]. The patient has two children from her first marriage. Her son Sarah Board "[name], who is 28 years old as of Oct 2021, was previously in the [employer], but now is taking courses at [employer] and  her 73 y/o daughter [name], who was studying psychology at [name] before  her optic pathway germinoma, which has left her 97% blind.  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 scholarship, doing premed) and 82 as of October 2020    ADVANCED DIRECTIVES: In the absence of any documents to the contrary the patient's husband is her healthcare power of attorney   HEALTH MAINTENANCE: Social History   Tobacco Use   Smoking status: Never Smoker   Smokeless tobacco: Never Used  Vaping Use   Vaping Use: Never used  Substance Use Topics   Alcohol use: No   Drug use: No     Colonoscopy: 02/01/2020  QAS:TMHD 2018  Bone density:Never   Allergies  Allergen Reactions   Carboplatin Other (See Comments)    Red, blotchy, hot    Current Outpatient Medications  Medication Sig Dispense Refill   acetaminophen (TYLENOL) 325 MG tablet Take 650 mg by mouth 3 (three) times daily.     doxycycline (VIBRA-TABS) 100 MG tablet Take 1 tablet (100 mg total) by mouth 2 (two) times daily. 20 tablet 1   gabapentin (NEURONTIN) 100 MG capsule Take 200 mg by mouth 2 (two) times daily.     gabapentin (NEURONTIN) 300 MG capsule TAKE 2 CAPSULES (600 MG TOTAL) BY MOUTH AT BEDTIME. 90 capsule 4   lidocaine-prilocaine (EMLA) cream Apply to affected area once 30 g 3   methocarbamol (ROBAXIN) 500 MG tablet Take 1 tablet (500 mg total) by mouth every 6 (six) hours as needed for muscle spasms. 30 tablet 1   naproxen sodium (ALEVE) 220 MG tablet Take 220 mg by mouth 3 (three) times daily.     prochlorperazine (COMPAZINE) 10 MG tablet Take 1 tablet (10 mg total) by mouth every 6 (six) hours as needed (Nausea or vomiting). 30 tablet 1   senna (SENOKOT) 8.6 MG tablet Take 2 tablets by mouth daily.     traMADol (ULTRAM) 50 MG tablet Take 1-2 tablets (50-100 mg total) by mouth every 6 (six) hours as needed. 60 tablet 0   venlafaxine XR (EFFEXOR-XR) 75 MG 24 hr capsule Take 1 capsule  (75 mg total) by mouth daily with breakfast. 60 capsule 6   No current facility-administered medications for this visit.    OBJECTIVE: White woman in no acute distress  There were no vitals filed for this visit.   There is no height or weight on file to calculate BMI.   There were no vitals filed for this visit. ECOG FS:1 - Symptomatic but  completely ambulatory  Sclerae unicteric, EOMs intact Wearing a mask No cervical or supraclavicular adenopathy Lungs no rales or rhonchi Heart regular rate and rhythm Abd soft, nontender, positive bowel sounds MSK no focal spinal tenderness, no upper extremity lymphedema Neuro: nonfocal, well oriented, appropriate affect Breasts:          LAB RESULTS:  CMP     Component Value Date/Time   NA 140 02/26/2020 1146   NA 140 01/07/2017 1342   K 3.3 (L) 02/26/2020 1146   K 3.5 01/07/2017 1342   CL 101 02/26/2020 1146   CO2 28 02/26/2020 1146   CO2 27 01/07/2017 1342   GLUCOSE 129 (H) 02/26/2020 1146   GLUCOSE 148 (H) 01/07/2017 1342   BUN 8 02/26/2020 1146   BUN 6.2 (L) 01/07/2017 1342   CREATININE 0.73 02/26/2020 1146   CREATININE 0.79 12/28/2019 1322   CREATININE 0.7 01/07/2017 1342   CALCIUM 9.1 02/26/2020 1146   CALCIUM 9.4 01/07/2017 1342   PROT 7.6 02/26/2020 1146   PROT 7.0 01/07/2017 1342   ALBUMIN 3.8 02/26/2020 1146   ALBUMIN 3.7 01/07/2017 1342   AST 14 (L) 02/26/2020 1146   AST 20 12/28/2019 1322   AST 23 01/07/2017 1342   ALT 12 02/26/2020 1146   ALT 19 12/28/2019 1322   ALT 24 01/07/2017 1342   ALKPHOS 113 02/26/2020 1146   ALKPHOS 105 01/07/2017 1342   BILITOT 0.9 02/26/2020 1146   BILITOT 0.9 12/28/2019 1322   BILITOT 0.84 01/07/2017 1342   GFRNONAA >60 02/26/2020 1146   GFRNONAA >60 12/28/2019 1322   GFRAA >60 05/23/2019 1921   GFRAA >60 10/13/2018 1346    No results found for: TOTALPROTELP, ALBUMINELP, A1GS, A2GS, BETS, BETA2SER, GAMS, MSPIKE, SPEI  No results found for: KPAFRELGTCHN, LAMBDASER,  [facility]  Lab Results  Component Value Date   WBC 2.2 (L) 02/26/2020   NEUTROABS 1.4 (L) 02/26/2020   HGB 11.7 (L) 02/26/2020   HCT 35.5 (L) 02/26/2020   MCV 86.2 02/26/2020   PLT 130 (L) 02/26/2020      Chemistry      Component Value Date/Time   NA 140 02/26/2020 1146   NA 140 01/07/2017 1342   K 3.3 (L) 02/26/2020 1146   K 3.5 01/07/2017 1342   CL 101 02/26/2020 1146   CO2 28 02/26/2020 1146   CO2 27 01/07/2017 1342   BUN 8 02/26/2020 1146   BUN 6.2 (L) 01/07/2017 1342   CREATININE 0.73 02/26/2020 1146   CREATININE 0.79 12/28/2019 1322   CREATININE 0.7 01/07/2017 1342      Component Value Date/Time   CALCIUM 9.1 02/26/2020 1146   CALCIUM 9.4 01/07/2017 1342   ALKPHOS 113 02/26/2020 1146   ALKPHOS 105 01/07/2017 1342   AST 14 (L) 02/26/2020 1146   AST 20 12/28/2019 1322   AST 23 01/07/2017 1342   ALT 12 02/26/2020 1146   ALT 19 12/28/2019 1322   ALT 24 01/07/2017 1342   BILITOT 0.9 02/26/2020 1146   BILITOT 0.9 12/28/2019 1322   BILITOT 0.84 01/07/2017 1342       No results found for: LABCA2  No components found for: [id number]  No results for input(s): INR in the last 168 hours.  Urinalysis    Component Value Date/Time   COLORURINE YELLOW 12/05/2019 1154   APPEARANCEUR CLOUDY (A) 12/05/2019 1154   LABSPEC 1.024 12/05/2019 1154   LABSPEC 1.025 11/23/2016 1251   PHURINE 5.0 12/05/2019 1154   GLUCOSEU NEGATIVE 12/05/2019 1154   GLUCOSEU  Negative 11/23/2016 1251   HGBUR SMALL (A) 12/05/2019 1154   BILIRUBINUR NEGATIVE 12/05/2019 1154   BILIRUBINUR Negative 11/23/2016 1251   KETONESUR NEGATIVE 12/05/2019 1154   PROTEINUR NEGATIVE 12/05/2019 1154   UROBILINOGEN 0.2 11/23/2016 1251   NITRITE NEGATIVE 12/05/2019 1154   LEUKOCYTESUR NEGATIVE 12/05/2019 1154   LEUKOCYTESUR Negative 11/23/2016 1251    STUDIES: No results found.   ELIGIBLE FOR AVAILABLE RESEARCH PROTOCOL: no   ASSESSMENT: 50 y.o. [place] woman status post left breast upper  outer quadrant and left axillary lymph node biopsy 06/19/2016, both positive for a clinical T2-T3 N1, stage IIIB-C invasive ductal carcinoma, grade 3, triple negative, with an MIB-1 of 90%  (1) genetics testing 08/04/2016 showed a variant of uncertain significance in the BRCA2 namely c.8169T>A (p.Asp2723Glu). 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 Additional testing of the patient's mother and maternal aunt is pending. Otherwise [facility]'s Common Hereditary Cancers Panel found no deleterious mutations in APC, ATM, AXIN2, BARD1, BMPR1A, BRCA1, BRCA2, BRIP1, CDH1, CDKN2A, CHEK2, CTNNA1, DICER1, EPCAM, GREM1, HOXB13, KIT, MEN1, MLH1, MSH2, MSH3, MSH6, MUTYH, NBN, NF1, NTHL1, PALB2, PDGFRA, PMS2, POLD1, POLE, PTEN, RAD50, RAD51C, RAD51D, SDHA, SDHB, SDHC, SDHD, SMAD4, SMARCA4, STK11, TP53, TSC1, TSC2, and VHL.  (2) neoadjuvant chemotherapy consisting of doxorubicin and cyclophosphamide in dose dense fashion 4 completed 09/10/2016 followed by paclitaxel weekly 12 given with carboplatin   (a) cycle 4 of cyclophosphamide and doxorubicin was delayed 10 days and dose decreased 10% because of febrile neutropenia after cycle 3  (b) cycle 6 of Paclitaxel and Carboplatin delayed due to neutropenia, therefore Granix added to Wednesday, Thursday, Friday following chemotherapy days  (3) status post left lumpectomy and left axillary sentinel lymph node sampling 01/27/2017 with pathology showing a complete pathologic response (ypT0 ypN0);   (a) 5 left axillary lymph nodes removed  (4) adjuvant radiation 03/11/2017-04/21/2017 Site/dose:   1. Left breast, 2 Gy in 25 fractions for a total dose of 50 Gy                      2. 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  (8) status post bilateral mastectomies 01/09/2020 showing  (a) on the right, no evidence of malignancy  (b) on the left, a pT2 pNX, stage IIB invasive ductal carcinoma, grade 3, with ample margins   (c) repeat prognostic panel on the left again weakly estrogen receptor positive, HER2 negative, progesterone receptor negative  (9) adjuvant therapy with cyclophosphamide, methotrexate, fluorouracil (CMF) given every 21 days x 8, started 02/19/2020  (a) cycle 2 held due to surgical wound dehiscence  (10) olaparib, adjuvant estrogens to follow  (11) foundation 1 requested on 01/09/2020 material found in addition to the BRCA2 mutation, a 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  ERV B2 and PI K3  CA were negative, and the microsatellite status was stable, with low mutational burden (4/MB)  (12) tamoxifen started 03/11/2020 pending resumptoionm of chemotherapy   PLAN: Petula is ready to start with chemotherapy and we reviewed nausea and other supportive medicines today.  I am going to bring her back in 1 week just to make sure her counts remain adequate and then of course I will see her again 3 weeks from now with her next cycle.  We will be following her wound.  This will take some time to heal but right now there is no evidence of inflammation or infection.  She has instructions from her surgeon on how to take care of it.  We discussed the foundation 1 results which aside from the BRCA2 mutation do not  give [place] any information regarding additional treatment targets  She knows to call for any other issue that may develop before the next visit  Total encounter time 25 minutes.*   Magrinat, [name], MD  03/10/20 9:58 PM Medical Oncology and Hematology [facility] [place], [place] [address] Tel. [phone]    Fax. [phone]   I, Wilburn Mylar, am acting as scribe for Dr. [name]. Magrinat.  I, Lurline Del MD, have reviewed the above documentation for accuracy and completeness, and I agree with the above.   *Total Encounter Time as defined by the Centers for Medicare and Medicaid Services includes, in addition to the face-to-face time of a patient visit (documented in the note above) non-face-to-face time: obtaining and reviewing outside history, ordering and reviewing medications, tests or procedures, care coordination (communications with other health care professionals or caregivers) and documentation in the medical record.

## 2020-03-11 ENCOUNTER — Inpatient Hospital Stay: Payer: Medicaid Other

## 2020-03-11 ENCOUNTER — Other Ambulatory Visit: Payer: Medicaid Other

## 2020-03-11 ENCOUNTER — Other Ambulatory Visit: Payer: Self-pay | Admitting: Oncology

## 2020-03-11 ENCOUNTER — Other Ambulatory Visit: Payer: Self-pay

## 2020-03-11 ENCOUNTER — Inpatient Hospital Stay: Payer: Medicaid Other | Attending: Oncology | Admitting: Oncology

## 2020-03-11 VITALS — BP 116/69 | HR 93 | Temp 97.9°F | Resp 18 | Ht 67.5 in | Wt 262.8 lb

## 2020-03-11 DIAGNOSIS — Z171 Estrogen receptor negative status [ER-]: Secondary | ICD-10-CM

## 2020-03-11 DIAGNOSIS — Z1501 Genetic susceptibility to malignant neoplasm of breast: Secondary | ICD-10-CM

## 2020-03-11 DIAGNOSIS — I10 Essential (primary) hypertension: Secondary | ICD-10-CM | POA: Diagnosis not present

## 2020-03-11 DIAGNOSIS — Z9013 Acquired absence of bilateral breasts and nipples: Secondary | ICD-10-CM

## 2020-03-11 DIAGNOSIS — T8130XD Disruption of wound, unspecified, subsequent encounter: Secondary | ICD-10-CM | POA: Insufficient documentation

## 2020-03-11 DIAGNOSIS — C50412 Malignant neoplasm of upper-outer quadrant of left female breast: Secondary | ICD-10-CM | POA: Diagnosis not present

## 2020-03-11 DIAGNOSIS — Z79899 Other long term (current) drug therapy: Secondary | ICD-10-CM | POA: Diagnosis not present

## 2020-03-11 DIAGNOSIS — C50912 Malignant neoplasm of unspecified site of left female breast: Secondary | ICD-10-CM

## 2020-03-11 DIAGNOSIS — Z1509 Genetic susceptibility to other malignant neoplasm: Secondary | ICD-10-CM

## 2020-03-11 DIAGNOSIS — E876 Hypokalemia: Secondary | ICD-10-CM | POA: Insufficient documentation

## 2020-03-11 DIAGNOSIS — Z6841 Body Mass Index (BMI) 40.0 and over, adult: Secondary | ICD-10-CM

## 2020-03-11 DIAGNOSIS — Z95828 Presence of other vascular implants and grafts: Secondary | ICD-10-CM | POA: Diagnosis not present

## 2020-03-11 DIAGNOSIS — K429 Umbilical hernia without obstruction or gangrene: Secondary | ICD-10-CM

## 2020-03-11 DIAGNOSIS — Z923 Personal history of irradiation: Secondary | ICD-10-CM | POA: Diagnosis not present

## 2020-03-11 LAB — CBC WITH DIFFERENTIAL/PLATELET
Abs Immature Granulocytes: 0.1 10*3/uL — ABNORMAL HIGH (ref 0.00–0.07)
Basophils Absolute: 0 10*3/uL (ref 0.0–0.1)
Basophils Relative: 0 %
Eosinophils Absolute: 0.1 10*3/uL (ref 0.0–0.5)
Eosinophils Relative: 1 %
HCT: 31.7 % — ABNORMAL LOW (ref 36.0–46.0)
Hemoglobin: 10.2 g/dL — ABNORMAL LOW (ref 12.0–15.0)
Immature Granulocytes: 2 %
Lymphocytes Relative: 24 %
Lymphs Abs: 1.2 10*3/uL (ref 0.7–4.0)
MCH: 27.9 pg (ref 26.0–34.0)
MCHC: 32.2 g/dL (ref 30.0–36.0)
MCV: 86.8 fL (ref 80.0–100.0)
Monocytes Absolute: 0.5 10*3/uL (ref 0.1–1.0)
Monocytes Relative: 9 %
Neutro Abs: 3.2 10*3/uL (ref 1.7–7.7)
Neutrophils Relative %: 64 %
Platelets: 230 10*3/uL (ref 150–400)
RBC: 3.65 MIL/uL — ABNORMAL LOW (ref 3.87–5.11)
RDW: 13.6 % (ref 11.5–15.5)
WBC: 5 10*3/uL (ref 4.0–10.5)
nRBC: 0 % (ref 0.0–0.2)

## 2020-03-11 LAB — COMPREHENSIVE METABOLIC PANEL
ALT: 10 U/L (ref 0–44)
AST: 11 U/L — ABNORMAL LOW (ref 15–41)
Albumin: 2.9 g/dL — ABNORMAL LOW (ref 3.5–5.0)
Alkaline Phosphatase: 101 U/L (ref 38–126)
Anion gap: 9 (ref 5–15)
BUN: 5 mg/dL — ABNORMAL LOW (ref 6–20)
CO2: 30 mmol/L (ref 22–32)
Calcium: 8.7 mg/dL — ABNORMAL LOW (ref 8.9–10.3)
Chloride: 99 mmol/L (ref 98–111)
Creatinine, Ser: 0.69 mg/dL (ref 0.44–1.00)
GFR, Estimated: >60 mL/min (ref >60)
Glucose, Bld: 118 mg/dL — ABNORMAL HIGH (ref 70–99)
Potassium: 3.1 mmol/L — ABNORMAL LOW (ref 3.5–5.1)
Sodium: 138 mmol/L (ref 135–145)
Total Bilirubin: 0.6 mg/dL (ref 0.3–1.2)
Total Protein: 7.2 g/dL (ref 6.5–8.1)

## 2020-03-11 MED ORDER — ANASTROZOLE 1 MG PO TABS: 1.0000 mg | ORAL_TABLET | Freq: Every day | ORAL | 4 refills | Status: DC

## 2020-03-11 MED ORDER — HEPARIN SOD (PORK) LOCK FLUSH 100 UNIT/ML IV SOLN
500.0000 [IU] | Freq: Once | INTRAVENOUS | Status: AC
  Administered 2020-03-11: 500 [IU]
  Filled 2020-03-11: qty 5

## 2020-03-11 MED ORDER — SODIUM CHLORIDE 0.9% FLUSH
10.0000 mL | Freq: Once | INTRAVENOUS | Status: AC
  Administered 2020-03-11: 10 mL
  Filled 2020-03-11: qty 10

## 2020-03-11 MED FILL — ANASTROZOLE 1 MG TABLET: 1 | 90 days supply | Qty: 90 | Fill #0

## 2020-03-12 ENCOUNTER — Telehealth: Payer: Self-pay | Admitting: Oncology

## 2020-03-12 LAB — FOLLICLE STIMULATING HORMONE: FSH: 41.7 m[IU]/mL

## 2020-03-12 MED ORDER — LIDOCAINE HCL 2 % IJ SOLN
INTRAMUSCULAR | Status: AC
  Filled 2020-03-12: qty 20

## 2020-03-12 NOTE — Telephone Encounter (Signed)
No 3/7 los. No changes made to pt's schedule.

## 2020-03-15 ENCOUNTER — Other Ambulatory Visit: Payer: Self-pay | Admitting: Oncology

## 2020-03-16 LAB — ESTRADIOL, ULTRA SENS: Estradiol, Sensitive: 10.5 pg/mL

## 2020-03-19 ENCOUNTER — Encounter: Payer: Self-pay | Admitting: Oncology

## 2020-03-19 ENCOUNTER — Other Ambulatory Visit: Payer: Self-pay | Admitting: Oncology

## 2020-03-21 ENCOUNTER — Other Ambulatory Visit: Payer: Self-pay | Admitting: General Surgery

## 2020-03-22 ENCOUNTER — Other Ambulatory Visit: Payer: Self-pay | Admitting: General Surgery

## 2020-03-25 ENCOUNTER — Other Ambulatory Visit (HOSPITAL_COMMUNITY)
Admission: RE | Admit: 2020-03-25 | Discharge: 2020-03-25 | Disposition: A | Discharge status: Home / Self Care | Payer: Medicaid Other | Source: Ambulatory Visit | Attending: General Surgery | Admitting: General Surgery

## 2020-03-25 DIAGNOSIS — Z20822 Contact with and (suspected) exposure to covid-19: Secondary | ICD-10-CM | POA: Insufficient documentation

## 2020-03-25 DIAGNOSIS — Z01812 Encounter for preprocedural laboratory examination: Secondary | ICD-10-CM | POA: Insufficient documentation

## 2020-03-26 LAB — SARS CORONAVIRUS 2 (TAT 6-24 HRS): SARS Coronavirus 2: NEGATIVE

## 2020-03-26 NOTE — Patient Instructions (Addendum)
DUE TO COVID-19 ONLY ONE VISITOR IS ALLOWED TO COME WITH YOU AND STAY IN THE WAITING ROOM ONLY DURING PRE OP AND PROCEDURE.          Your procedure is scheduled on:  Thursday, 03-28-20   Report to [facility] Main  Entrance   Report to admitting at 7:00 AM   Call this number if you have problems the morning of surgery [phone]   Do not eat food :After Midnight.   May have liquids until 6:00 AM day of surgery  CLEAR LIQUID DIET  Foods Allowed                                                                     Foods Excluded  Water, Black Coffee and tea, regular and decaf                             liquids that you cannot  Plain Jell-O in any flavor  (No red)                                           see through such as: Fruit ices (not with fruit pulp)                                     milk, soups, orange juice              Iced Popsicles (No red)                                    All solid food                                   Apple juices Sports drinks like Gatorade (No red) Lightly seasoned clear broth or consume(fat free) Sugar, honey syrup     Complete one Ensure drink the morning of surgery at 6:00 AM  the day of surgery.      1. The day of surgery:  ? Drink ONE (1) Pre-Surgery Clear Ensure or G2 by am the morning of surgery. Drink in one sitting. Do not sip.  ? This drink was given to you during your hospital  pre-op appointment visit. ? Nothing else to drink after completing the  Pre-Surgery Clear Ensure or G2.          If you have questions, please contact your surgeon's office.     Oral Hygiene is also important to reduce your risk of infection.                                    Remember - BRUSH YOUR TEETH THE MORNING OF SURGERY WITH YOUR REGULAR TOOTHPASTE   Do NOT smoke after Midnight   Take these medicines the morning of surgery with A SIP  OF WATER: Gabapentin                               You may not have any metal on your body  including hair pins, jewelry, and body piercings             Do not wear make-up, lotions, powders, perfumes/cologne, or deodorant             Do not wear nail polish.  Do not shave  48 hours prior to surgery.    Do not bring valuables to the hospital. [name].   Contacts, dentures or bridgework may not be worn into surgery.   Patients discharged the day of surgery will not be allowed to drive home.    Please read over the following fact sheets you were given: IF YOU HAVE QUESTIONS ABOUT YOUR PRE OP INSTRUCTIONS PLEASE CALL  [facility] - Preparing for Surgery Before surgery, you can play an important role.  Because skin is not sterile, your skin needs to be as free of germs as possible.  You can reduce the number of germs on your skin by washing with CHG (chlorahexidine gluconate) soap before surgery.  CHG is an antiseptic cleaner which kills germs and bonds with the skin to continue killing germs even after washing. Please DO NOT use if you have an allergy to CHG or antibacterial soaps.  If your skin becomes reddened/irritated stop using the CHG and inform your nurse when you arrive at Short Stay. Do not shave (including legs and underarms) for at least 48 hours prior to the first CHG shower.  You may shave your face/neck.  Please follow these instructions carefully:  1.  Shower with CHG Soap the night before surgery and the  morning of surgery.  2.  If you choose to wash your hair, wash your hair first as usual with your normal  shampoo.  3.  After you shampoo, rinse your hair and body thoroughly to remove the shampoo.                             4.  Use CHG as you would any other liquid soap.  You can apply chg directly to the skin and wash.  Gently with a scrungie or clean washcloth.  5.  Apply the CHG Soap to your body ONLY FROM THE NECK DOWN.   Do   not use on face/ open                           Wound or open sores.  Avoid contact with eyes, ears mouth and   genitals (private parts).                       Wash face,  Genitals (private parts) with your normal soap.             6.  Wash thoroughly, paying special attention to the area where your    surgery  will be performed.  7.  Thoroughly rinse your body with warm water from the neck down.  8.  DO NOT shower/wash with your normal soap after using and rinsing off the CHG Soap.  9.  Pat yourself dry with a clean towel.            10.  Wear clean pajamas.            11.  Place clean sheets on your bed the night of your first shower and do not  sleep with pets. Day of Surgery : Do not apply any lotions/deodorants the morning of surgery.  Please wear clean clothes to the hospital/surgery center.  FAILURE TO FOLLOW THESE INSTRUCTIONS MAY RESULT IN THE CANCELLATION OF YOUR SURGERY  PATIENT SIGNATURE_________________________________  NURSE SIGNATURE__________________________________  ________________________________________________________________________

## 2020-03-26 NOTE — Progress Notes (Addendum)
COVID Vaccine Completed:  No Date COVID Vaccine completed: Has received booster: COVID vaccine manufacturer: [place]   Date of COVID positive in last 90 days:  N/A  PCP - [name], MD Cardiologist - Loralie Champagne, MD.  No cardiac issues, evaluation for chemo treatment.  Chest x-ray - 05-23-19 Epic EKG - 05-25-19 Epic Stress Test - 2007 in Epic  ECHO - 06-01-17 Epic Cardiac Cath -  Pacemaker/ICD device last checked: Spinal Cord Stimulator:  Sleep Study - N/A CPAP -   Fasting Blood Sugar - N/A Checks Blood Sugar _____ times a day  Blood Thinner Instructions:  N/A Aspirin Instructions: Last Dose:  Activity level:  Can go up a flight of stairs and perform activities of daily living without stopping and without symptoms of chest pain or shortness of breath.  Able to exercise without symptoms      Anesthesia review: N/A  Patient denies shortness of breath, fever, cough and chest pain at PAT appointment   Patient verbalized understanding of instructions that were given to them at the PAT appointment. Patient was also instructed that they will need to review over the PAT instructions again at home before surgery.

## 2020-03-27 ENCOUNTER — Other Ambulatory Visit: Payer: Self-pay

## 2020-03-27 ENCOUNTER — Encounter (HOSPITAL_COMMUNITY)
Admission: RE | Admit: 2020-03-27 | Discharge: 2020-03-27 | Disposition: A | Discharge status: Home / Self Care | Payer: Medicaid Other | Source: Ambulatory Visit | Attending: General Surgery | Admitting: General Surgery

## 2020-03-27 ENCOUNTER — Encounter (HOSPITAL_COMMUNITY): Payer: Self-pay

## 2020-03-27 DIAGNOSIS — Z01812 Encounter for preprocedural laboratory examination: Secondary | ICD-10-CM | POA: Diagnosis not present

## 2020-03-27 LAB — CBC
HCT: 36.3 % (ref 36.0–46.0)
Hemoglobin: 11.5 g/dL — ABNORMAL LOW (ref 12.0–15.0)
MCH: 28.6 pg (ref 26.0–34.0)
MCHC: 31.7 g/dL (ref 30.0–36.0)
MCV: 90.3 fL (ref 80.0–100.0)
Platelets: 299 10*3/uL (ref 150–400)
RBC: 4.02 MIL/uL (ref 3.87–5.11)
RDW: 14.9 % (ref 11.5–15.5)
WBC: 4.3 10*3/uL (ref 4.0–10.5)
nRBC: 0 % (ref 0.0–0.2)

## 2020-03-27 LAB — BASIC METABOLIC PANEL
Anion gap: 12 (ref 5–15)
BUN: 7 mg/dL (ref 6–20)
CO2: 24 mmol/L (ref 22–32)
Calcium: 9.4 mg/dL (ref 8.9–10.3)
Chloride: 103 mmol/L (ref 98–111)
Creatinine, Ser: 0.67 mg/dL (ref 0.44–1.00)
GFR, Estimated: >60 mL/min (ref >60)
Glucose, Bld: 122 mg/dL — ABNORMAL HIGH (ref 70–99)
Potassium: 3.4 mmol/L — ABNORMAL LOW (ref 3.5–5.1)
Sodium: 139 mmol/L (ref 135–145)

## 2020-03-28 ENCOUNTER — Ambulatory Visit (HOSPITAL_COMMUNITY): Payer: Medicaid Other | Admitting: Certified Registered Nurse Anesthetist

## 2020-03-28 ENCOUNTER — Ambulatory Visit (HOSPITAL_COMMUNITY)
Admission: RE | Admit: 2020-03-28 | Discharge: 2020-03-28 | Disposition: A | Discharge status: Home / Self Care | Payer: Medicaid Other | Source: Ambulatory Visit | Attending: General Surgery | Admitting: General Surgery

## 2020-03-28 ENCOUNTER — Encounter (HOSPITAL_COMMUNITY)
Admission: RE | Disposition: A | Discharge status: Home / Self Care | Payer: Self-pay | Source: Ambulatory Visit | Attending: General Surgery

## 2020-03-28 ENCOUNTER — Encounter (HOSPITAL_COMMUNITY): Payer: Self-pay | Admitting: General Surgery

## 2020-03-28 ENCOUNTER — Other Ambulatory Visit (HOSPITAL_COMMUNITY): Payer: Self-pay | Admitting: General Surgery

## 2020-03-28 DIAGNOSIS — Z1501 Genetic susceptibility to malignant neoplasm of breast: Secondary | ICD-10-CM | POA: Insufficient documentation

## 2020-03-28 DIAGNOSIS — Z79811 Long term (current) use of aromatase inhibitors: Secondary | ICD-10-CM | POA: Diagnosis not present

## 2020-03-28 DIAGNOSIS — Z923 Personal history of irradiation: Secondary | ICD-10-CM | POA: Diagnosis not present

## 2020-03-28 DIAGNOSIS — Z791 Long term (current) use of non-steroidal anti-inflammatories (NSAID): Secondary | ICD-10-CM | POA: Diagnosis not present

## 2020-03-28 DIAGNOSIS — Z9221 Personal history of antineoplastic chemotherapy: Secondary | ICD-10-CM | POA: Diagnosis not present

## 2020-03-28 DIAGNOSIS — Z853 Personal history of malignant neoplasm of breast: Secondary | ICD-10-CM | POA: Insufficient documentation

## 2020-03-28 DIAGNOSIS — Z9013 Acquired absence of bilateral breasts and nipples: Secondary | ICD-10-CM | POA: Diagnosis not present

## 2020-03-28 DIAGNOSIS — Z79899 Other long term (current) drug therapy: Secondary | ICD-10-CM | POA: Diagnosis not present

## 2020-03-28 DIAGNOSIS — L7682 Other postprocedural complications of skin and subcutaneous tissue: Secondary | ICD-10-CM | POA: Diagnosis not present

## 2020-03-28 HISTORY — PX: DEBRIDEMENT AND CLOSURE WOUND: SHX5614

## 2020-03-28 SURGERY — DEBRIDEMENT, WOUND, WITH CLOSURE
Anesthesia: General | Site: Breast | Laterality: Left

## 2020-03-28 MED ORDER — ACETAMINOPHEN 500 MG PO TABS
ORAL_TABLET | ORAL | Status: AC
  Administered 2020-03-28: 1000 mg via ORAL
  Filled 2020-03-28: qty 2

## 2020-03-28 MED ORDER — PROPOFOL 10 MG/ML IV BOLUS
INTRAVENOUS | Status: AC
  Filled 2020-03-28: qty 20

## 2020-03-28 MED ORDER — CEFAZOLIN SODIUM-DEXTROSE 2-4 GM/100ML-% IV SOLN
INTRAVENOUS | Status: AC
  Filled 2020-03-28: qty 100

## 2020-03-28 MED ORDER — MIDAZOLAM HCL 2 MG/2ML IJ SOLN
INTRAMUSCULAR | Status: AC
  Filled 2020-03-28: qty 2

## 2020-03-28 MED ORDER — KETOROLAC TROMETHAMINE 30 MG/ML IJ SOLN
30.0000 mg | Freq: Once | INTRAMUSCULAR | Status: AC | PRN
  Administered 2020-03-28: 30 mg via INTRAVENOUS

## 2020-03-28 MED ORDER — ENSURE PRE-SURGERY PO LIQD
296.0000 mL | Freq: Once | ORAL | Status: DC
  Filled 2020-03-28: qty 296

## 2020-03-28 MED ORDER — 0.9 % SODIUM CHLORIDE (POUR BTL) OPTIME
TOPICAL | Status: DC | PRN
  Administered 2020-03-28: 1000 mL

## 2020-03-28 MED ORDER — ACETAMINOPHEN 160 MG/5ML PO SOLN: 325.0000 mg | ORAL | Status: DC | PRN

## 2020-03-28 MED ORDER — MIDAZOLAM HCL 5 MG/5ML IJ SOLN
INTRAMUSCULAR | Status: DC | PRN
  Administered 2020-03-28 (×2): 1 mg via INTRAVENOUS

## 2020-03-28 MED ORDER — ONDANSETRON HCL 4 MG/2ML IJ SOLN
INTRAMUSCULAR | Status: DC | PRN
  Administered 2020-03-28: 4 mg via INTRAVENOUS

## 2020-03-28 MED ORDER — ACETAMINOPHEN 325 MG PO TABS: 325.0000 mg | ORAL_TABLET | ORAL | Status: DC | PRN

## 2020-03-28 MED ORDER — FENTANYL CITRATE (PF) 100 MCG/2ML IJ SOLN
INTRAMUSCULAR | Status: AC
  Filled 2020-03-28: qty 2

## 2020-03-28 MED ORDER — OXYCODONE HCL 5 MG PO TABS: 5.0000 mg | ORAL_TABLET | Freq: Four times a day (QID) | ORAL | 0 refills | Status: DC | PRN

## 2020-03-28 MED ORDER — DEXAMETHASONE SODIUM PHOSPHATE 10 MG/ML IJ SOLN
INTRAMUSCULAR | Status: DC | PRN
  Administered 2020-03-28: 6 mg via INTRAVENOUS

## 2020-03-28 MED ORDER — EPHEDRINE SULFATE-NACL 50-0.9 MG/10ML-% IV SOSY
PREFILLED_SYRINGE | INTRAVENOUS | Status: DC | PRN
  Administered 2020-03-28 (×2): 5 mg via INTRAVENOUS

## 2020-03-28 MED ORDER — KETOROLAC TROMETHAMINE 30 MG/ML IJ SOLN
INTRAMUSCULAR | Status: AC
  Filled 2020-03-28: qty 1

## 2020-03-28 MED ORDER — PHENYLEPHRINE 40 MCG/ML (10ML) SYRINGE FOR IV PUSH (FOR BLOOD PRESSURE SUPPORT)
PREFILLED_SYRINGE | INTRAVENOUS | Status: DC | PRN
  Administered 2020-03-28: 80 ug via INTRAVENOUS
  Administered 2020-03-28: 40 ug via INTRAVENOUS
  Administered 2020-03-28 (×2): 80 ug via INTRAVENOUS

## 2020-03-28 MED ORDER — CEFAZOLIN SODIUM-DEXTROSE 2-4 GM/100ML-% IV SOLN
2.0000 g | INTRAVENOUS | Status: AC
  Administered 2020-03-28: 2 g via INTRAVENOUS

## 2020-03-28 MED ORDER — ORAL CARE MOUTH RINSE: 15.0000 mL | Freq: Once | OROMUCOSAL | Status: AC

## 2020-03-28 MED ORDER — PROMETHAZINE HCL 25 MG/ML IJ SOLN: 6.2500 mg | INTRAMUSCULAR | Status: DC | PRN

## 2020-03-28 MED ORDER — ROCURONIUM BROMIDE 10 MG/ML (PF) SYRINGE
PREFILLED_SYRINGE | INTRAVENOUS | Status: AC
  Filled 2020-03-28: qty 10

## 2020-03-28 MED ORDER — ESMOLOL HCL 100 MG/10ML IV SOLN
INTRAVENOUS | Status: DC | PRN
  Administered 2020-03-28: 20 mg via INTRAVENOUS

## 2020-03-28 MED ORDER — OXYCODONE HCL 5 MG PO TABS
5.0000 mg | ORAL_TABLET | Freq: Once | ORAL | Status: AC | PRN
  Administered 2020-03-28: 5 mg via ORAL

## 2020-03-28 MED ORDER — CHLORHEXIDINE GLUCONATE 0.12 % MT SOLN
15.0000 mL | Freq: Once | OROMUCOSAL | Status: AC
  Administered 2020-03-28: 15 mL via OROMUCOSAL

## 2020-03-28 MED ORDER — OXYCODONE HCL 5 MG PO TABS
ORAL_TABLET | ORAL | Status: AC
  Filled 2020-03-28: qty 1

## 2020-03-28 MED ORDER — ACETAMINOPHEN 500 MG PO TABS: 1000.0000 mg | ORAL_TABLET | ORAL | Status: AC

## 2020-03-28 MED ORDER — LACTATED RINGERS IV SOLN: INTRAVENOUS | Status: DC

## 2020-03-28 MED ORDER — LIDOCAINE 2% (20 MG/ML) 5 ML SYRINGE
INTRAMUSCULAR | Status: DC | PRN
  Administered 2020-03-28: 100 mg via INTRAVENOUS

## 2020-03-28 MED ORDER — PROPOFOL 10 MG/ML IV BOLUS
INTRAVENOUS | Status: AC
  Filled 2020-03-28: qty 40

## 2020-03-28 MED ORDER — PROPOFOL 10 MG/ML IV BOLUS
INTRAVENOUS | Status: DC | PRN
  Administered 2020-03-28: 200 mg via INTRAVENOUS

## 2020-03-28 MED ORDER — OXYCODONE HCL 5 MG/5ML PO SOLN: 5.0000 mg | Freq: Once | ORAL | Status: AC | PRN

## 2020-03-28 MED ORDER — FENTANYL CITRATE (PF) 100 MCG/2ML IJ SOLN
INTRAMUSCULAR | Status: DC | PRN
  Administered 2020-03-28 (×4): 50 ug via INTRAVENOUS

## 2020-03-28 MED ORDER — MEPERIDINE HCL 50 MG/ML IJ SOLN
6.2500 mg | INTRAMUSCULAR | Status: DC | PRN
Start: 2020-03-28 — End: 2020-03-28

## 2020-03-28 MED ORDER — SUCCINYLCHOLINE CHLORIDE 200 MG/10ML IV SOSY
PREFILLED_SYRINGE | INTRAVENOUS | Status: AC
  Filled 2020-03-28: qty 10

## 2020-03-28 MED ORDER — ROCURONIUM BROMIDE 100 MG/10ML IV SOLN
INTRAVENOUS | Status: DC | PRN
  Administered 2020-03-28: 10 mg via INTRAVENOUS

## 2020-03-28 MED ORDER — FENTANYL CITRATE (PF) 100 MCG/2ML IJ SOLN
25.0000 ug | INTRAMUSCULAR | Status: DC | PRN
  Administered 2020-03-28 (×2): 25 ug via INTRAVENOUS
  Administered 2020-03-28: 50 ug via INTRAVENOUS

## 2020-03-28 MED ORDER — SUGAMMADEX SODIUM 200 MG/2ML IV SOLN
INTRAVENOUS | Status: DC | PRN
  Administered 2020-03-28: 50 mg via INTRAVENOUS

## 2020-03-28 MED ORDER — ESMOLOL HCL 100 MG/10ML IV SOLN
INTRAVENOUS | Status: AC
  Filled 2020-03-28: qty 10

## 2020-03-28 MED ORDER — BACITRACIN ZINC 500 UNIT/GM EX OINT
TOPICAL_OINTMENT | CUTANEOUS | Status: AC
  Filled 2020-03-28: qty 28.35

## 2020-03-28 MED ORDER — SUCCINYLCHOLINE CHLORIDE 20 MG/ML IJ SOLN
INTRAMUSCULAR | Status: DC | PRN
  Administered 2020-03-28: 160 mg via INTRAVENOUS

## 2020-03-28 MED FILL — oxyCODONE HCL 5 MG TABS: 5 | 2 days supply | Qty: 10 | Fill #0

## 2020-03-28 SURGICAL SUPPLY — 42 items
ADH SKN CLS APL DERMABOND .7 (GAUZE/BANDAGES/DRESSINGS)
APL PRP STRL LF DISP 70% ISPRP (MISCELLANEOUS)
BINDER BREAST XLRG (GAUZE/BANDAGES/DRESSINGS) ×1 IMPLANT
BLADE HEX COATED 2.75 (ELECTRODE) ×2 IMPLANT
BLADE SURG 15 STRL LF DISP TIS (BLADE) ×3 IMPLANT
BLADE SURG 15 STRL SS (BLADE) ×6
CHLORAPREP W/TINT 26 (MISCELLANEOUS) IMPLANT
COVER SURGICAL LIGHT HANDLE (MISCELLANEOUS) ×2 IMPLANT
COVER WAND RF STERILE (DRAPES) IMPLANT
DECANTER SPIKE VIAL GLASS SM (MISCELLANEOUS) ×2 IMPLANT
DERMABOND ADVANCED (GAUZE/BANDAGES/DRESSINGS)
DERMABOND ADVANCED .7 DNX12 (GAUZE/BANDAGES/DRESSINGS) IMPLANT
DRAPE LAPAROSCOPIC ABDOMINAL (DRAPES) ×2 IMPLANT
DRSG PAD ABDOMINAL 8X10 ST (GAUZE/BANDAGES/DRESSINGS) ×1 IMPLANT
ELECT REM PT RETURN 15FT ADLT (MISCELLANEOUS) ×2 IMPLANT
GAUZE 4X4 16PLY RFD (DISPOSABLE) ×2 IMPLANT
GAUZE SPONGE 4X4 12PLY STRL (GAUZE/BANDAGES/DRESSINGS) IMPLANT
GLOVE SURG ENC MOIS LTX SZ7 (GLOVE) ×2 IMPLANT
GLOVE SURG UNDER POLY LF SZ7 (GLOVE) ×2 IMPLANT
GLOVE SURG UNDER POLY LF SZ7.5 (GLOVE) ×2 IMPLANT
GOWN STRL REUS W/ TWL XL LVL3 (GOWN DISPOSABLE) ×1 IMPLANT
GOWN STRL REUS W/TWL LRG LVL3 (GOWN DISPOSABLE) ×4 IMPLANT
GOWN STRL REUS W/TWL XL LVL3 (GOWN DISPOSABLE) ×4 IMPLANT
KIT BASIN OR (CUSTOM PROCEDURE TRAY) ×2 IMPLANT
KIT TURNOVER KIT A (KITS) ×2 IMPLANT
MARKER SKIN DUAL TIP RULER LAB (MISCELLANEOUS) ×2 IMPLANT
NDL HYPO 25X1 1.5 SAFETY (NEEDLE) ×1 IMPLANT
NEEDLE HYPO 22GX1.5 SAFETY (NEEDLE) IMPLANT
NEEDLE HYPO 25X1 1.5 SAFETY (NEEDLE) ×2 IMPLANT
PACK BASIC VI WITH GOWN DISP (CUSTOM PROCEDURE TRAY) ×2 IMPLANT
PENCIL SMOKE EVACUATOR (MISCELLANEOUS) IMPLANT
STRIP CLOSURE SKIN 1/2X4 (GAUZE/BANDAGES/DRESSINGS) ×2 IMPLANT
SUT ETHILON 2 0 PS N (SUTURE) ×8 IMPLANT
SUT MNCRL AB 4-0 PS2 18 (SUTURE) ×2 IMPLANT
SUT VIC AB 2-0 SH 27 (SUTURE) ×2
SUT VIC AB 2-0 SH 27X BRD (SUTURE) ×1 IMPLANT
SUT VIC AB 3-0 SH 27 (SUTURE) ×2
SUT VIC AB 3-0 SH 27XBRD (SUTURE) ×1 IMPLANT
SYR BULB IRRIG 60ML STRL (SYRINGE) IMPLANT
SYR CONTROL 10ML LL (SYRINGE) ×2 IMPLANT
TOWEL OR 17X26 10 PK STRL BLUE (TOWEL DISPOSABLE) ×2 IMPLANT
TOWEL OR NON WOVEN STRL DISP B (DISPOSABLE) ×2 IMPLANT

## 2020-03-28 NOTE — Interval H&P Note (Signed)
History and Physical Interval Note:  03/28/2020 8:29 AM  [name]  has presented today for surgery, with the diagnosis of OPEN WOUND MASTECTOMY.  The various methods of treatment have been discussed with the patient and family. After consideration of risks, benefits and other options for treatment, the patient has consented to  Procedure(s): DEBRIDEMENT AND CLOSURE OF MASTECTOMY WOUND (N/A) as a surgical intervention.  The patient's history has been reviewed, patient examined, no change in status, stable for surgery.  I have reviewed the patient's chart and labs.  Questions were answered to the patient's satisfaction.     Rolm Bookbinder

## 2020-03-28 NOTE — Transfer of Care (Signed)
Immediate Anesthesia Transfer of Care Note  Patient: [name]  Procedure(s) Performed: DEBRIDEMENT AND CLOSURE OF MASTECTOMY WOUND (Left Breast)  Patient Location: PACU  Anesthesia Type:General  Level of Consciousness: awake, drowsy and patient cooperative  Airway & Oxygen Therapy: Patient Spontanous Breathing and Patient connected to face mask oxygen  Post-op Assessment: Report given to RN and Post -op Vital signs reviewed and stable  Post vital signs: Reviewed and stable  Last Vitals:  Vitals Value Taken Time  BP 120/79 03/28/20 1002  Temp    Pulse 90 03/28/20 1006  Resp 16 03/28/20 1006  SpO2 100 % 03/28/20 1006  Vitals shown include unvalidated device data.  Last Pain:  Vitals:   03/28/20 0736  TempSrc:   PainSc: 3       Patients Stated Pain Goal: 2 (63/84/53 6468)  Complications: No complications documented.

## 2020-03-28 NOTE — H&P (Signed)
[name] is an 50 y.o. female.   Chief Complaint: wound breakdown HPI:  61 yof with recurrent left breast cancer, brca positive. she underwent right ij port, right rrm, left mastectomy (unable to localize sn) 1/4. drains out. path on right side csl only. left side is 2.1 cm idc. she has some wound breakdown at T portion on left that I debrided a couple weeks ago. She presented to the office on 03/05/20 with concerns of low-grade temperatures x 4 days. Tmax was 102.29F. continued doxycycline, packed the area. I saw her again on 03/07/20 and repacked the area. We switched her antibiotics to Augmentin. She states she had some fevers over the weekend, max 101.9. She thinks the area slightly better. Denies fever today. we have stopped chemo. no fevers since I last saw her. still with some drainage.   Past Medical History:  Diagnosis Date  . Arthritis   . Breast cancer ([place])    Left Breast Cancer  . Breast cancer ([place]) 11/24/2019   left   . Complication of anesthesia    DAY SURGERY 2010 OR 2011 ASPIRATED AND STAYED OVERNIGHT  . History of radiation therapy 03/11/17- 04/21/17   Left breast, 2 Gy in 25 fractions for a total dose of 50 Gy, Boost, 2 Gy in 5 fractions for a total dose of 10 Gy  . Hypertension    OFF MEDS  SINCE CHEMO X 5 MONTHS  . Malignant neoplasm of upper-outer quadrant of left breast in female, estrogen receptor negative ([place]) 06/23/2016  . Personal history of chemotherapy 2019   Left Breast Cancer  07-2016-12-2016  . Personal history of radiation therapy 2019   Left Breast Cancer    Past Surgical History:  Procedure Laterality Date  . ANKLE SURGERY    . BREAST LUMPECTOMY Left 01/27/2017  . BREAST LUMPECTOMY WITH RADIOACTIVE SEED AND SENTINEL LYMPH NODE BIOPSY Left 01/27/2017   Procedure: LEFT BREAST LUMPECTOMY WITH RADIOACTIVE SEED LOCALIZATION, LEFT AXILLARY DEEP LYMPH NODE BIOPSY WITH RADIOACTIVE SEED LOCALIZATION, LEFT AXILLARY SENTINEL LYMPH NODE MAPPING AND  BIOPSY WITH BLUE DYE INJECTION;  Surgeon: [name], MD;  Location: [facility];  Service: General;  Laterality: Left;  . COLONOSCOPY WITH PROPOFOL N/A 02/01/2020   Procedure: COLONOSCOPY WITH PROPOFOL;  Surgeon: Milus Banister, MD;  Location: [facility];  Service: Endoscopy;  Laterality: N/A;  . ENDOSCOPIC MUCOSAL RESECTION  02/01/2020   Procedure: ENDOSCOPIC MUCOSAL RESECTION;  Surgeon: Milus Banister, MD;  Location: [facility];  Service: Endoscopy;;  . HEMOSTASIS CLIP PLACEMENT  02/01/2020   Procedure: HEMOSTASIS CLIP PLACEMENT;  Surgeon: Milus Banister, MD;  Location[facility];  Service: Endoscopy;;  . IR FLUORO GUIDE PORT INSERTION RIGHT  07/14/2016  . IR US GUIDE VASC ACCESS RIGHT  07/14/2016  . MASTECTOMY W/ SENTINEL NODE BIOPSY Bilateral 01/09/2020   Procedure: BILATERAL MASTECTOMY WITH LEFT AXILLARY SENTINEL LYMPH NODE BIOPSY;  Surgeon: Rolm Bookbinder, MD;  Location: [facility];  Service: General;  Laterality: Bilateral;  . PORT-A-CATH REMOVAL Right 01/27/2017   Procedure: REMOVAL PORT-A-CATH;  Surgeon: [name], MD;  Location: [facility];  Service: General;  Laterality: Right;  . PORTACATH PLACEMENT Right 01/09/2020   Procedure: INSERTION PORT-A-CATH;  Surgeon: Rolm Bookbinder, MD;  Location: [facility];  Service: General;  Laterality: Right;  . SUBMUCOSAL LIFTING INJECTION  02/01/2020   Procedure: SUBMUCOSAL LIFTING INJECTION;  Surgeon: Milus Banister, MD;  [facility];  Service: Endoscopy;;  . TUBAL LIGATION    .  WISDOM TOOTH EXTRACTION      Family History  Problem Relation Age of Onset  . Diabetes Mother   . Hypertension Mother   . Arthritis Mother   . Colon cancer Father 21       died .76 metastatic at time of diagnosis  . Diabetes Father   . Heart disease Father   . Hypertension Father   . Breast cancer Maternal Grandmother 16       d.60s  . Breast cancer Maternal Aunt 52  . Cervical cancer Maternal Aunt 44  .  Breast cancer Maternal Aunt 46       d.50s  . Cancer Maternal Uncle        d.62s unspecified type of cancer  . Breast cancer Cousin 65       paternal first-cousin (daughter of unaffected aunt)  . Cancer Maternal Aunt 61       "Female Cancer"  . Cancer Maternal Aunt        unknown cancer  . Brain cancer Daughter 1  . Esophageal cancer Other   . Colon polyps Neg Hx   . Rectal cancer Neg Hx   . Stomach cancer Neg Hx    Social History:  reports that she has never smoked. She has never used smokeless tobacco. She reports that she does not drink alcohol and does not use drugs.  Allergies:  Allergies  Allergen Reactions  . Carboplatin Other (See Comments)    Red, blotchy, hot    Medications Prior to Admission  Medication Sig Dispense Refill  . gabapentin (NEURONTIN) 100 MG capsule Take 200 mg by mouth 2 (two) times daily. Morning & afternoon    . gabapentin (NEURONTIN) 300 MG capsule TAKE 2 CAPSULES (600 MG TOTAL) BY MOUTH AT BEDTIME. 90 capsule 4  . ibuprofen (ADVIL) 800 MG tablet Take 800 mg by mouth every 8 (eight) hours as needed (pain).    Marland Kitchen lidocaine-prilocaine (EMLA) cream Apply to affected area once (Patient taking differently: Apply 1 application topically See admin instructions. Apply to affected area prior to port access.) 30 g 3  . methocarbamol (ROBAXIN) 500 MG tablet Take 1 tablet (500 mg total) by mouth every 6 (six) hours as needed for muscle spasms. (Patient taking differently: Take 500 mg by mouth 3 (three) times daily as needed for muscle spasms.) 30 tablet 1  . prochlorperazine (COMPAZINE) 10 MG tablet Take 1 tablet (10 mg total) by mouth every 6 (six) hours as needed (Nausea or vomiting). 30 tablet 1  . senna-docusate (SENOKOT-S) 8.6-50 MG tablet Take 2 tablets by mouth at bedtime.    . traMADol (ULTRAM) 50 MG tablet TAKE 1 TO 2 TABLETS BY MOUTH EVERY 6 HOURS AS NEEDED. (Patient taking differently: Take 50-100 mg by mouth every 6 (six) hours as needed (pain).) 20  tablet 0  . anastrozole (ARIMIDEX) 1 MG tablet Take 1 tablet (1 mg total) by mouth daily. 90 tablet 4  . doxycycline (VIBRA-TABS) 100 MG tablet Take 1 tablet (100 mg total) by mouth 2 (two) times daily. (Patient not taking: Reported on 03/25/2020) 20 tablet 1  . venlafaxine XR (EFFEXOR-XR) 75 MG 24 hr capsule Take 1 capsule (75 mg total) by mouth daily with breakfast. (Patient not taking: Reported on 03/25/2020) 60 capsule 6    Results for orders placed or performed during the hospital encounter of 03/27/20 (from the past 48 hour(s))  CBC per protocol     Status: Abnormal   Collection Time: 03/27/20  8:40 AM  Result Value Ref Range   WBC 4.3 4.0 - 10.5 K/uL   RBC 4.02 3.87 - 5.11 MIL/uL   Hemoglobin 11.5 (L) 12.0 - 15.0 g/dL   HCT 36.3 36.0 - 46.0 %   MCV 90.3 80.0 - 100.0 fL   MCH 28.6 26.0 - 34.0 pg   MCHC 31.7 30.0 - 36.0 g/dL   RDW 14.9 11.5 - 15.5 %   Platelets 299 150 - 400 K/uL   nRBC 0.0 0.0 - 0.2 %    Comment: Performed at [facility], [place] [address] [place], [place] [address]  Basic metabolic panel per protocol     Status: Abnormal   Collection Time: 03/27/20  8:40 AM  Result Value Ref Range   Sodium 139 135 - 145 mmol/L   Potassium 3.4 (L) 3.5 - 5.1 mmol/L   Chloride 103 98 - 111 mmol/L   CO2 24 22 - 32 mmol/L   Glucose, Bld 122 (H) 70 - 99 mg/dL    Comment: Glucose reference range applies only to samples taken after fasting for at least 8 hours.   BUN 7 6 - 20 mg/dL   Creatinine, Ser 0.67 0.44 - 1.00 mg/dL   Calcium 9.4 8.9 - 10.3 mg/dL   GFR, Estimated >60 >60 mL/min    Comment: (NOTE) Calculated using the CKD-EPI Creatinine Equation (2021)    Anion gap 12 5 - 15    Comment: Performed at [facility], [place] [address], [place], [place] [address]   No results found.  Review of Systems  All other systems reviewed and are negative.   Blood pressure (!) 152/74, pulse 84, temperature 98.6 F (37 C), temperature source Oral,  resp. rate 18, height 5' 7" (1.702 m), weight 117.3 kg, SpO2 99 %. Physical Exam  nad rrr clear left mastectomy incision with 4x4 cm breakdown and cavity laterally with exudate, no infection  Assessment/Plan Debridement and closure left mastectomy wound  Rolm Bookbinder, MD 03/28/2020, 8:28 AM

## 2020-03-28 NOTE — Discharge Instructions (Signed)
[facility], [place] [id number]   POST OP INSTRUCTIONS Take 400 mg of ibuprofen every 8 hours or 650 mg tylenol every 6 hours for next 72 hours then as needed. Use ice several times daily also. Always review your discharge instruction sheet given to you by the facility where your surgery was performed. IF YOU HAVE DISABILITY OR FAMILY LEAVE FORMS, YOU MUST BRING THEM TO THE OFFICE FOR PROCESSING.   DO NOT GIVE THEM TO YOUR DOCTOR. A prescription for pain medication may be given to you upon discharge.  Take your pain medication as prescribed, if needed.  If narcotic pain medicine is not needed, then you may take acetaminophen (Tylenol), naprosyn (Alleve) or ibuprofen (Advil) as needed. 1. Take your usually prescribed medications unless otherwise directed. 2. If you need a refill on your pain medication, please contact your pharmacy.  They will contact our office to request authorization.  Prescriptions will not be filled after 5pm or on week-ends. 3. You should follow a light diet the first few days after arrival home, such as soup and crackers, etc.  Resume your normal diet the day after surgery. 4. Most patients will experience some swelling and bruising on the chest and underarm.  Ice packs will help.  Swelling and bruising can take several days to resolve. Wear the binder day and night until you return to the office.  5. It is common to experience some constipation if taking pain medication after surgery.  Increasing fluid intake and taking a stool softener (such as Colace) will usually help or prevent this problem from occurring.  A mild laxative (Milk of Magnesia or Miralax) should be taken according to package instructions if there are no bowel movements after 48 hours. 6. Unless discharge instructions indicate otherwise, leave your bandage dry and in place until your next appointment in 3-5 days.  You may take a limited sponge bath.  No tube baths or showers until the drains are  removed.  You may have steri-strips (small skin tapes) in place directly over the incision.  These strips should be left on the skin for 7-10 days. If you have glue it will come off in next couple week.  Any sutures will be removed at an office visit 7. DRAINS:  If you have drains in place, it is important to keep a list of the amount of drainage produced each day in your drains.  Before leaving the hospital, you should be instructed on drain care.  Call our office if you have any questions about your drains. I will remove your drains when they put out less than 30 cc or ml for 2 consecutive days. 8. ACTIVITIES:  You may resume regular (light) daily activities beginning the next day--such as daily self-care, walking, climbing stairs--gradually increasing activities as tolerated.  You may have sexual intercourse when it is comfortable.  Refrain from any heavy lifting or straining until approved by your doctor. a. You may drive when you are no longer taking prescription pain medication, you can comfortably wear a seatbelt, and you can safely maneuver your car and apply brakes. b. RETURN TO WORK:  __________________________________________________________ 9. You should see your doctor in the office for a follow-up appointment approximately 3-5 days after your surgery.  Your doctor's nurse will typically make your follow-up appointment when she calls you with your pathology report.  Expect your pathology report 3-4business days after surgery. 10. OTHER INSTRUCTIONS: ______________________________________________________________________________________________ ____________________________________________________________________________________________ WHEN TO CALL YOUR DR Randal Goens: 1. Fever over 101.0 2.  Nausea and/or vomiting 3. Extreme swelling or bruising 4. Continued bleeding from incision. 5. Increased pain, redness, or drainage from the incision. The clinic staff is available to answer your questions  during regular business hours.  Please don't hesitate to call and ask to speak to one of the nurses for clinical concerns.  If you have a medical emergency, go to the nearest emergency room or call 911.  A surgeon from [facility] is always on call at the hospital. [address] [place], [place], [place]  [address] ? [address], [place], [place]   [address] [phone] ? [phone] ? FAX [phone] Web site: [email or web address]

## 2020-03-28 NOTE — Anesthesia Postprocedure Evaluation (Signed)
Anesthesia Post Note  Patient: [name]  Procedure(s) Performed: DEBRIDEMENT AND CLOSURE OF MASTECTOMY WOUND (Left Breast)     Patient location during evaluation: PACU Anesthesia Type: General Level of consciousness: awake Pain management: pain level controlled Vital Signs Assessment: post-procedure vital signs reviewed and stable Respiratory status: spontaneous breathing Cardiovascular status: stable Postop Assessment: no apparent nausea or vomiting Anesthetic complications: no   No complications documented.  Last Vitals:  Vitals:   03/28/20 1045 03/28/20 1053  BP: 129/90 100/86  Pulse: 89 94  Resp: 16 20  Temp: 36.5 C 36.6 C  SpO2: 98% 96%    Last Pain:  Vitals:   03/28/20 1053  TempSrc: Oral  PainSc: [name]

## 2020-03-28 NOTE — Anesthesia Preprocedure Evaluation (Signed)
Anesthesia Evaluation  Patient identified by MRN, date of birth, ID band Patient awake    Reviewed: Allergy & Precautions, NPO status , Patient's Chart, lab work & pertinent test results  Airway Mallampati: I       Dental no notable dental hx.    Pulmonary neg pulmonary ROS,    Pulmonary exam normal        Cardiovascular hypertension, Normal cardiovascular exam Rhythm:Regular Rate:Normal     Neuro/Psych negative neurological ROS  negative psych ROS   GI/Hepatic negative GI ROS, Neg liver ROS,   Endo/Other  Morbid obesity  Renal/GU negative Renal ROS  negative genitourinary   Musculoskeletal   Abdominal (+) + obese,   Peds  Hematology negative hematology ROS (+)   Anesthesia Other Findings   Reproductive/Obstetrics                             Anesthesia Physical Anesthesia Plan  ASA: III  Anesthesia Plan: General   Post-op Pain Management:    Induction: Intravenous  PONV Risk Score and Plan: 4 or greater and Ondansetron, Dexamethasone and Midazolam  Airway Management Planned: Oral ETT  Additional Equipment: None  Intra-op Plan:   Post-operative Plan:   Informed Consent: I have reviewed the patients History and Physical, chart, labs and discussed the procedure including the risks, benefits and alternatives for the proposed anesthesia with the patient or authorized representative who has indicated his/her understanding and acceptance.     Dental advisory given  Plan Discussed with: CRNA  Anesthesia Plan Comments:         Anesthesia Quick Evaluation

## 2020-03-28 NOTE — Progress Notes (Signed)
Patient instructed on JP drain care and use of arm sling on discharge.

## 2020-03-28 NOTE — Anesthesia Procedure Notes (Signed)
Procedure Name: Intubation Performed by: West Pugh, CRNA Pre-anesthesia Checklist: Patient identified, Emergency Drugs available, Suction available, Patient being monitored and Timeout performed Patient Re-evaluated:Patient Re-evaluated prior to induction Oxygen Delivery Method: Circle system utilized Preoxygenation: Pre-oxygenation with 100% oxygen Induction Type: IV induction Ventilation: Mask ventilation without difficulty Laryngoscope Size: Mac and 4 Tube type: Oral Airway Equipment and Method: Stylet Placement Confirmation: ETT inserted through vocal cords under direct vision,  positive ETCO2,  CO2 detector and breath sounds checked- equal and bilateral Secured at: 22 cm Tube secured with: Tape Dental Injury: Teeth and Oropharynx as per pre-operative assessment

## 2020-03-28 NOTE — Op Note (Signed)
Preoperative diagnosis: Open wound status post left mastectomy status post radiotherapy Postoperative diagnosis: Same as above Procedure: Debridement of 3 x 3 cm of necrotic skin, closure of left mastectomy wound Surgeon: Dr. Serita Grammes Anesthesia: General Drains: 19 Leeroy Bock Blake drain Specimens: None Complications: None Estimated blood loss: Minimal Sponge and count was correct completion Disposition recovery stable condition  Indications: This a 50 year old female who is undergone prior lumpectomy and radiation with new breast cancer.  She then underwent a mastectomy.  She had a wound at that was healing.  She began chemotherapy and this got significantly larger with an infection.  I have debrided this and take care of it in the office but we decided to go to the operating room to clean this up and attempt closure.  Procedure: After informed consent was obtained the patient was taken the operating.  She was given antibiotics.  SCDs were in place.  She was placed on general anesthesia without complication.  She was prepped and draped in the standard sterile surgical fashion.  Surgical timeout was then performed.  I then debrided the edges of the wound.  I also extended onto the abdomen and removed about 3 x 3 cm of tissue.  The remainder of the tissue appeared viable.  I then mobilized the flaps off the pectoralis muscle to get myself some mobility.  I placed a 5 [place] Blake drain.  With some tension I then closed this with multiple 2-0 nylon horizontal mattress as well as simple sutures.  I then placed bacitracin and a sterile dressing.  She tolerated this well was extubated and transferred to recovery stable.

## 2020-03-29 ENCOUNTER — Encounter: Payer: Self-pay | Admitting: Oncology

## 2020-03-29 ENCOUNTER — Encounter (HOSPITAL_COMMUNITY): Payer: Self-pay | Admitting: General Surgery

## 2020-03-29 MED FILL — Dexamethasone Sodium Phosphate Inj 100 MG/10ML: INTRAMUSCULAR | Qty: 1 | Status: AC

## 2020-04-01 ENCOUNTER — Other Ambulatory Visit: Payer: Self-pay | Admitting: Medical

## 2020-04-01 ENCOUNTER — Inpatient Hospital Stay: Payer: Medicaid Other

## 2020-04-01 ENCOUNTER — Other Ambulatory Visit: Payer: Self-pay

## 2020-04-01 ENCOUNTER — Inpatient Hospital Stay (HOSPITAL_BASED_OUTPATIENT_CLINIC_OR_DEPARTMENT_OTHER): Payer: Medicaid Other | Admitting: Medical

## 2020-04-01 VITALS — BP 125/77 | HR 91 | Temp 98.8°F | Resp 17 | Wt 247.2 lb

## 2020-04-01 DIAGNOSIS — C50412 Malignant neoplasm of upper-outer quadrant of left female breast: Secondary | ICD-10-CM | POA: Diagnosis not present

## 2020-04-01 DIAGNOSIS — E876 Hypokalemia: Secondary | ICD-10-CM | POA: Diagnosis not present

## 2020-04-01 DIAGNOSIS — Z171 Estrogen receptor negative status [ER-]: Secondary | ICD-10-CM

## 2020-04-01 DIAGNOSIS — Z95828 Presence of other vascular implants and grafts: Secondary | ICD-10-CM

## 2020-04-01 DIAGNOSIS — Z9013 Acquired absence of bilateral breasts and nipples: Secondary | ICD-10-CM

## 2020-04-01 DIAGNOSIS — Z1501 Genetic susceptibility to malignant neoplasm of breast: Secondary | ICD-10-CM

## 2020-04-01 DIAGNOSIS — C50912 Malignant neoplasm of unspecified site of left female breast: Secondary | ICD-10-CM

## 2020-04-01 DIAGNOSIS — K429 Umbilical hernia without obstruction or gangrene: Secondary | ICD-10-CM

## 2020-04-01 LAB — COMPREHENSIVE METABOLIC PANEL
ALT: 11 U/L (ref 0–44)
AST: 11 U/L — ABNORMAL LOW (ref 15–41)
Albumin: 3 g/dL — ABNORMAL LOW (ref 3.5–5.0)
Alkaline Phosphatase: 113 U/L (ref 38–126)
Anion gap: 10 (ref 5–15)
BUN: 7 mg/dL (ref 6–20)
CO2: 29 mmol/L (ref 22–32)
Calcium: 8.5 mg/dL — ABNORMAL LOW (ref 8.9–10.3)
Chloride: 102 mmol/L (ref 98–111)
Creatinine, Ser: 0.62 mg/dL (ref 0.44–1.00)
GFR, Estimated: >60 mL/min (ref >60)
Glucose, Bld: 96 mg/dL (ref 70–99)
Potassium: 3.3 mmol/L — ABNORMAL LOW (ref 3.5–5.1)
Sodium: 141 mmol/L (ref 135–145)
Total Bilirubin: 0.5 mg/dL (ref 0.3–1.2)
Total Protein: 6.9 g/dL (ref 6.5–8.1)

## 2020-04-01 LAB — CBC WITH DIFFERENTIAL/PLATELET
Abs Immature Granulocytes: 0.02 10*3/uL (ref 0.00–0.07)
Basophils Absolute: 0 10*3/uL (ref 0.0–0.1)
Basophils Relative: 1 %
Eosinophils Absolute: 0.6 10*3/uL — ABNORMAL HIGH (ref 0.0–0.5)
Eosinophils Relative: 11 %
HCT: 32.3 % — ABNORMAL LOW (ref 36.0–46.0)
Hemoglobin: 10.3 g/dL — ABNORMAL LOW (ref 12.0–15.0)
Immature Granulocytes: 0 %
Lymphocytes Relative: 30 %
Lymphs Abs: 1.6 10*3/uL (ref 0.7–4.0)
MCH: 28.4 pg (ref 26.0–34.0)
MCHC: 31.9 g/dL (ref 30.0–36.0)
MCV: 89 fL (ref 80.0–100.0)
Monocytes Absolute: 0.4 10*3/uL (ref 0.1–1.0)
Monocytes Relative: 8 %
Neutro Abs: 2.6 10*3/uL (ref 1.7–7.7)
Neutrophils Relative %: 50 %
Platelets: 217 10*3/uL (ref 150–400)
RBC: 3.63 MIL/uL — ABNORMAL LOW (ref 3.87–5.11)
RDW: 14.7 % (ref 11.5–15.5)
WBC: 5.2 10*3/uL (ref 4.0–10.5)
nRBC: 0 % (ref 0.0–0.2)

## 2020-04-01 MED ORDER — HYDROCODONE-ACETAMINOPHEN 5-325 MG PO TABS: ORAL_TABLET | ORAL | 0 refills | Status: DC

## 2020-04-01 MED ORDER — SODIUM CHLORIDE 0.9% FLUSH
10.0000 mL | Freq: Once | INTRAVENOUS | Status: AC
  Administered 2020-04-01: 10 mL
  Filled 2020-04-01: qty 10

## 2020-04-01 MED ORDER — POTASSIUM CHLORIDE CRYS ER 20 MEQ PO TBCR: 20.0000 meq | EXTENDED_RELEASE_TABLET | Freq: Every day | ORAL | 2 refills | Status: DC

## 2020-04-01 MED ORDER — HEPARIN SOD (PORK) LOCK FLUSH 100 UNIT/ML IV SOLN
500.0000 [IU] | Freq: Once | INTRAVENOUS | Status: AC
Start: 2020-04-01 — End: 2020-04-01
  Administered 2020-04-01: 500 [IU]
  Filled 2020-04-01: qty 5

## 2020-04-01 MED FILL — POTASSIUM CHLORIDE CRYS ER: 20 | 30 days supply | Qty: 30 | Fill #0

## 2020-04-01 MED FILL — HYDROCODON-APAP 5-325: 5-325 | 5 days supply | Qty: 40 | Fill #0

## 2020-04-01 NOTE — Patient Instructions (Signed)
Implanted Port Insertion, Care After This sheet gives you information about how to care for yourself after your procedure. Your health care provider may also give you more specific instructions. If you have problems or questions, contact your health care provider. What can I expect after the procedure? After the procedure, it is common to have:  Discomfort at the port insertion site.  Bruising on the skin over the port. This should improve over 3-4 days. Follow these instructions at home: Port care  After your port is placed, you will get a manufacturer's information card. The card has information about your port. Keep this card with you at all times.  Take care of the port as told by your health care provider. Ask your health care provider if you or a family member can get training for taking care of the port at home. A home health care nurse may also take care of the port.  Make sure to remember what type of port you have. Incision care  Follow instructions from your health care provider about how to take care of your port insertion site. Make sure you: ? Wash your hands with soap and water before and after you change your bandage (dressing). If soap and water are not available, use hand sanitizer. ? Change your dressing as told by your health care provider. ? Leave stitches (sutures), skin glue, or adhesive strips in place. These skin closures may need to stay in place for 2 weeks or longer. If adhesive strip edges start to loosen and curl up, you may trim the loose edges. Do not remove adhesive strips completely unless your health care provider tells you to do that.  Check your port insertion site every day for signs of infection. Check for: ? Redness, swelling, or pain. ? Fluid or blood. ? Warmth. ? Pus or a bad smell.      Activity  Return to your normal activities as told by your health care provider. Ask your health care provider what activities are safe for you.  Do not  lift anything that is heavier than 10 lb (4.5 kg), or the limit that you are told, until your health care provider says that it is safe. General instructions  Take over-the-counter and prescription medicines only as told by your health care provider.  Do not take baths, swim, or use a hot tub until your health care provider approves. Ask your health care provider if you may take showers. You may only be allowed to take sponge baths.  Do not drive for 24 hours if you were given a sedative during your procedure.  Wear a medical alert bracelet in case of an emergency. This will tell any health care providers that you have a port.  Keep all follow-up visits as told by your health care provider. This is important. Contact a health care provider if:  You cannot flush your port with saline as directed, or you cannot draw blood from the port.  You have a fever or chills.  You have redness, swelling, or pain around your port insertion site.  You have fluid or blood coming from your port insertion site.  Your port insertion site feels warm to the touch.  You have pus or a bad smell coming from the port insertion site. Get help right away if:  You have chest pain or shortness of breath.  You have bleeding from your port that you cannot control. Summary  Take care of the port as told by your   health care provider. Keep the manufacturer's information card with you at all times.  Change your dressing as told by your health care provider.  Contact a health care provider if you have a fever or chills or if you have redness, swelling, or pain around your port insertion site.  Keep all follow-up visits as told by your health care provider. This information is not intended to replace advice given to you by your health care provider. Make sure you discuss any questions you have with your health care provider. Document Revised: 07/20/2017 Document Reviewed: 07/20/2017 Elsevier Patient Education   2021 Elsevier Inc.  

## 2020-04-03 NOTE — Progress Notes (Signed)
[facility] Progress Note   [name] [id number] 1970-10-11 50 y.o.  [name] is managed by [name]  Actively treated with chemotherapy/immunotherapy/hormonal therapy: yes  Current therapy: CMF  Last treated: 02/19/2020 (cycle #1, day #1)  Next scheduled appointment with provider: 04/22/2020  Assessment: Plan:    Hypokalemia - Plan: potassium chloride SA (KLOR-CON) 20 MEQ tablet  Malignant neoplasm of upper-outer quadrant of left breast in female, estrogen receptor negative ([place])   Hypokalemia: Patient's labs returned today showing a potassium of 3.3.  Based on this she was placed on potassium chloride 20 mEq once daily for 2 weeks.  She was given a prescription for 30 days with refills and was told that she should hold onto this should she need to restart potassium supplements at some point.  ER negative malignant neoplasm of the left breast: The patient is status post cycle 1, day 1 of CMF which was dosed on 02/17/2020.  Chemotherapy is being held indefinitely as she had a dehiscence of her left chest wall mastectomy and had to have surgery to close this.  She currently has a drain in place.  She is scheduled to return for follow-up to see [name] on 04/22/2020.  Please see After Visit Summary for patient specific instructions.  Future Appointments  Date Time Provider [place]  04/22/2020  8:15 AM [facility] None  04/22/2020  8:30 AM [facility] [place] FLUSH [facility] None  04/22/2020  9:00 AM Magrinat, [name], MD [facility] None  04/22/2020  9:30 AM [facility] INFUSION [facility] None    No orders of the defined types were placed in this encounter.      Subjective:   Patient ID:  [name] is a 50 y.o. (DOB 1970-04-17) female.  Chief Complaint: No chief complaint on file.   HPI [name]  is a 50 y.o. female with a diagnosis of an ER negative malignant neoplasm of the left breast.  She  is managed by [name] and is status post cycle 1, day 1 of CMF which was dosed on 02/17/2020.  She had a dehiscence of her left chest wall mastectomy site recently.  She had to have surgery last week to close the area.  She has a drain in place.  [name] is aware.  The patient states that plans are to hold chemotherapy indefinitely until she heals.  She is scheduled to return for follow-up to see [name] on 04/22/2020.  She sees her surgeon on Friday.  [name] told her to limit motion of her left arm when asked to decrease tension on her surgical site.  She denies fevers, chills, sweats, nausea, or vomiting.   Medications: I have reviewed the patient's current medications.  Allergies:  Allergies  Allergen Reactions  . Carboplatin Other (See Comments)    Red, blotchy, hot    Past Medical History:  Diagnosis Date  . Arthritis   . Breast cancer ([place])    Left Breast Cancer  . Breast cancer ([place]) 11/24/2019   left   . Complication of anesthesia    DAY SURGERY 2010 OR 2011 ASPIRATED AND STAYED OVERNIGHT  . History of radiation therapy 03/11/17- 04/21/17   Left breast, 2 Gy in 25 fractions for a total dose of 50 Gy, Boost, 2 Gy in 5 fractions for a total dose of 10 Gy  . Hypertension    OFF MEDS  SINCE CHEMO X 5 MONTHS  . Malignant neoplasm of upper-outer  quadrant of left breast in female, estrogen receptor negative ([place]) 06/23/2016  . Personal history of chemotherapy 2019   Left Breast Cancer  07-2016-12-2016  . Personal history of radiation therapy 2019   Left Breast Cancer    Past Surgical History:  Procedure Laterality Date  . ANKLE SURGERY    . BREAST LUMPECTOMY Left 01/27/2017  . BREAST LUMPECTOMY WITH RADIOACTIVE SEED AND SENTINEL LYMPH NODE BIOPSY Left 01/27/2017   Procedure: LEFT BREAST LUMPECTOMY WITH RADIOACTIVE SEED LOCALIZATION, LEFT AXILLARY DEEP LYMPH NODE BIOPSY WITH RADIOACTIVE SEED LOCALIZATION, LEFT AXILLARY SENTINEL LYMPH NODE MAPPING AND BIOPSY WITH  BLUE DYE INJECTION;  Surgeon: [name], MD;  Location: [facility];  Service: General;  Laterality: Left;  . COLONOSCOPY WITH PROPOFOL N/A 02/01/2020   Procedure: COLONOSCOPY WITH PROPOFOL;  Surgeon: Milus Banister, MD;  [facility];  Service: Endoscopy;  Laterality: N/A;  . DEBRIDEMENT AND CLOSURE WOUND Left 03/28/2020   Procedure: DEBRIDEMENT AND CLOSURE OF MASTECTOMY WOUND;  Surgeon: Rolm Bookbinder, MD;  [facility];  Service: General;  Laterality: Left;  . ENDOSCOPIC MUCOSAL RESECTION  02/01/2020   Procedure: ENDOSCOPIC MUCOSAL RESECTION;  Surgeon: Milus Banister, MD;  [facility];  Service: Endoscopy;;  . HEMOSTASIS CLIP PLACEMENT  02/01/2020   Procedure: HEMOSTASIS CLIP PLACEMENT;  Surgeon: Milus Banister, MD;  [facility];  Service: Endoscopy;;  . IR FLUORO GUIDE PORT INSERTION RIGHT  07/14/2016  . IR US GUIDE VASC ACCESS RIGHT  07/14/2016  . MASTECTOMY W/ SENTINEL NODE BIOPSY Bilateral 01/09/2020   Procedure: BILATERAL MASTECTOMY WITH LEFT AXILLARY SENTINEL LYMPH NODE BIOPSY;  Surgeon: Rolm Bookbinder, MD;  Location: [facility];  Service: General;  Laterality: Bilateral;  . PORT-A-CATH REMOVAL Right 01/27/2017   Procedure: REMOVAL PORT-A-CATH;  Surgeon: [name], MD;  Location: [facility];  Service: General;  Laterality: Right;  . PORTACATH PLACEMENT Right 01/09/2020   Procedure: INSERTION PORT-A-CATH;  Surgeon: Rolm Bookbinder, MD;  Location: [facility];  Service: General;  Laterality: Right;  . SUBMUCOSAL LIFTING INJECTION  02/01/2020   Procedure: SUBMUCOSAL LIFTING INJECTION;  Surgeon: Milus Banister, MD;  [facility];  Service: Endoscopy;;  . TUBAL LIGATION    . WISDOM TOOTH EXTRACTION      Family History  Problem Relation Age of Onset  . Diabetes Mother   . Hypertension Mother   . Arthritis Mother   . Colon cancer Father 48       died .76 metastatic at time of diagnosis  . Diabetes  Father   . Heart disease Father   . Hypertension Father   . Breast cancer Maternal Grandmother 49       d.60s  . Breast cancer Maternal Aunt 52  . Cervical cancer Maternal Aunt 28  . Breast cancer Maternal Aunt 46       d.50s  . Cancer Maternal Uncle        d.62s unspecified type of cancer  . Breast cancer Cousin 55       paternal first-cousin (daughter of unaffected aunt)  . Cancer Maternal Aunt 1       "Female Cancer"  . Cancer Maternal Aunt        unknown cancer  . Brain cancer Daughter 34  . Esophageal cancer Other   . Colon polyps Neg Hx   . Rectal cancer Neg Hx   . Stomach cancer Neg Hx     Social History   Socioeconomic History  . Marital status: Married  Spouse name: Not on file  . Number of children: Not on file  . Years of education: Not on file  . Highest education level: Not on file  Occupational History  . Not on file  Tobacco Use  . Smoking status: Never Smoker  . Smokeless tobacco: Never Used  Vaping Use  . Vaping Use: Never used  Substance and Sexual Activity  . Alcohol use: No  . Drug use: No  . Sexual activity: Not Currently  Other Topics Concern  . Not on file  Social History Narrative  . Not on file   Social Determinants of Health   Financial Resource Strain: Not on file  Food Insecurity: Not on file  Transportation Needs: Not on file  Physical Activity: Not on file  Stress: Not on file  Social Connections: Not on file  Intimate Partner Violence: Not on file    Past Medical History, Surgical history, Social history, and Family history were reviewed and updated as appropriate.   Please see review of systems for further details on the patient's review from today.   Review of Systems:  Review of Systems  Constitutional: Negative for chills, diaphoresis and fever.  HENT: Negative for facial swelling and trouble swallowing.   Respiratory: Negative for cough, chest tightness and shortness of breath.   Cardiovascular: Negative for  chest pain.  Skin: Positive for wound. Negative for rash.       Recent dehiscence of a left mastectomy site.    Objective:   Physical Exam:  BP 125/77   Pulse 91   Temp 98.8 F (37.1 C)   Resp 17   Wt 247 lb 3.2 oz (112.1 kg)   LMP  (LMP Unknown)   SpO2 100%   BMI 38.72 kg/m  ECOG: 0  Physical Exam Constitutional:      General: She is not in acute distress.    Appearance: She is not diaphoretic.  HENT:     Head: Normocephalic and atraumatic.  Eyes:     General: No scleral icterus.       Right eye: No discharge.        Left eye: No discharge.     Conjunctiva/sclera: Conjunctivae normal.  Cardiovascular:     Rate and Rhythm: Normal rate and regular rhythm.     Heart sounds: Normal heart sounds. No murmur heard. No friction rub. No gallop.   Pulmonary:     Effort: Pulmonary effort is normal. No respiratory distress.     Breath sounds: Normal breath sounds. No wheezing or rales.  Skin:    General: Skin is warm and dry.     Findings: No erythema or rash.  Neurological:     Mental Status: She is alert.     Coordination: Coordination normal.     Gait: Gait normal.  Psychiatric:        Mood and Affect: Mood normal.        Behavior: Behavior normal.        Thought Content: Thought content normal.        Judgment: Judgment normal.   Breasts: The left chest wall is status post a mastectomy.  There is a drain inferior to the surgical site.  There are sutures in place.  No drainage is noted from the incision site.  There is clear bloody drainage and be surgical drain and reservoir.  Lab Review:     Component Value Date/Time   NA 141 04/01/2020 0855   NA 140 01/07/2017 1342  K 3.3 (L) 04/01/2020 0855   K 3.5 01/07/2017 1342   CL 102 04/01/2020 0855   CO2 29 04/01/2020 0855   CO2 27 01/07/2017 1342   GLUCOSE 96 04/01/2020 0855   GLUCOSE 148 (H) 01/07/2017 1342   BUN 7 04/01/2020 0855   BUN 6.2 (L) 01/07/2017 1342   CREATININE 0.62 04/01/2020 0855   CREATININE  0.79 12/28/2019 1322   CREATININE 0.7 01/07/2017 1342   CALCIUM 8.5 (L) 04/01/2020 0855   CALCIUM 9.4 01/07/2017 1342   PROT 6.9 04/01/2020 0855   PROT 7.0 01/07/2017 1342   ALBUMIN 3.0 (L) 04/01/2020 0855   ALBUMIN 3.7 01/07/2017 1342   AST 11 (L) 04/01/2020 0855   AST 20 12/28/2019 1322   AST 23 01/07/2017 1342   ALT 11 04/01/2020 0855   ALT 19 12/28/2019 1322   ALT 24 01/07/2017 1342   ALKPHOS 113 04/01/2020 0855   ALKPHOS 105 01/07/2017 1342   BILITOT 0.5 04/01/2020 0855   BILITOT 0.9 12/28/2019 1322   BILITOT 0.84 01/07/2017 1342   GFRNONAA >60 04/01/2020 0855   GFRNONAA >60 12/28/2019 1322   GFRAA >60 05/23/2019 1921   GFRAA >60 10/13/2018 1346       Component Value Date/Time   WBC 5.2 04/01/2020 0855   RBC 3.63 (L) 04/01/2020 0855   HGB 10.3 (L) 04/01/2020 0855   HGB 13.4 12/28/2019 1322   HGB 10.9 (L) 01/07/2017 1342   HCT 32.3 (L) 04/01/2020 0855   HCT 33.4 (L) 01/07/2017 1342   PLT 217 04/01/2020 0855   PLT 175 12/28/2019 1322   PLT 141 (L) 01/07/2017 1342   MCV 89.0 04/01/2020 0855   MCV 99.4 01/07/2017 1342   MCH 28.4 04/01/2020 0855   MCHC 31.9 04/01/2020 0855   RDW 14.7 04/01/2020 0855   RDW 16.4 (H) 01/07/2017 1342   LYMPHSABS 1.6 04/01/2020 0855   LYMPHSABS 0.9 01/07/2017 1342   MONOABS 0.4 04/01/2020 0855   MONOABS 0.4 01/07/2017 1342   EOSABS 0.6 (H) 04/01/2020 0855   EOSABS 0.0 01/07/2017 1342   BASOSABS 0.0 04/01/2020 0855   BASOSABS 0.0 01/07/2017 1342   -------------------------------  Imaging from last 24 hours (if applicable):  Radiology interpretation: No results found.

## 2020-04-09 ENCOUNTER — Other Ambulatory Visit: Payer: Self-pay

## 2020-04-09 ENCOUNTER — Encounter (HOSPITAL_BASED_OUTPATIENT_CLINIC_OR_DEPARTMENT_OTHER): Payer: Medicaid Other | Attending: Internal Medicine | Admitting: Internal Medicine

## 2020-04-09 DIAGNOSIS — Y828 Other medical devices associated with adverse incidents: Secondary | ICD-10-CM | POA: Insufficient documentation

## 2020-04-09 DIAGNOSIS — Y842 Radiological procedure and radiotherapy as the cause of abnormal reaction of the patient, or of later complication, without mention of misadventure at the time of the procedure: Secondary | ICD-10-CM | POA: Diagnosis not present

## 2020-04-09 DIAGNOSIS — N641 Fat necrosis of breast: Secondary | ICD-10-CM | POA: Insufficient documentation

## 2020-04-09 NOTE — Progress Notes (Signed)
REYLENE, STAUDER ([id number]) Visit Report for 04/09/2020 Abuse/Suicide Risk Screen Details Patient Name: Date of Service: [name] 04/09/2020 12:30 PM Medical Record Number: [id number] Patient Account Number: [id number] Date of Birth/Sex: Treating RN: June 30, 1970 (50 y.o. Female) Baruch Gouty Primary Care Dyke Weible: [name] Other Clinician: Referring Tongela Encinas: Treating Asjah Rauda/Extender: [name] in Treatment: 0 Abuse/Suicide Risk Screen Items Answer ABUSE RISK SCREEN: Has anyone close to you tried to hurt or harm you recentlyo No Do you feel uncomfortable with anyone in your familyo No Has anyone forced you do things that you didnt want to doo No Electronic Signature(s) Signed: 04/09/2020 5:05:08 PM By: Baruch Gouty RN, BSN Entered By: Baruch Gouty on 04/09/2020 13:18:59 -------------------------------------------------------------------------------- Activities of Daily Living Details Patient Name: Date of Service: [name] 04/09/2020 12:30 PM Medical Record Number: [id number] Patient Account Number: [id number] Date of Birth/Sex: Treating RN: 08/25/1970 (50 y.o. Female) Baruch Gouty Primary Care Amare Kontos: [name] Other Clinician: Referring Azavion Bouillon: Treating Lowell Makara/Extender: [name] in Treatment: 0 Activities of Daily Living Items Answer Activities of Daily Living (Please select one for each item) Drive Automobile Completely Able T Medications ake Completely Able Use T elephone Completely Able Care for Appearance Completely Able Use T oilet Completely Able Bath / Shower Completely Able Dress Self Completely Able Feed Self Completely Able Walk Completely Able Get In / Out Bed Completely Able Housework Need Assistance Prepare Meals Completely Able Handle Money Completely Able Shop for Self Completely Able Electronic Signature(s) Signed: 04/09/2020 5:05:08 PM  By: Baruch Gouty RN, BSN Entered By: Baruch Gouty on 04/09/2020 13:19:54 -------------------------------------------------------------------------------- Education Screening Details Patient Name: Date of Service: [name] 04/09/2020 12:30 PM Medical Record Number: [id number] Patient Account Number: [id number] Date of Birth/Sex: Treating RN: [birth year] (50 y.o. Female) Baruch Gouty Primary Care Ethin Drummond: [name] Other Clinician: Referring Shafiq Larch: Treating Amya Hlad/Extender: [name] in Treatment: 0 Primary Learner Assessed: Patient Learning Preferences/Education Level/Primary Language Learning Preference: Explanation, Demonstration, Printed Material Highest Education Level: College or Above Preferred Language: English Cognitive Barrier Language Barrier: No Translator Needed: No Memory Deficit: No Emotional Barrier: No Cultural/Religious Beliefs Affecting Medical Care: No Physical Barrier Impaired Vision: Yes Glasses Impaired Hearing: No Decreased Hand dexterity: No Knowledge/Comprehension Knowledge Level: High Comprehension Level: High Ability to understand written instructions: High Ability to understand verbal instructions: High Motivation Anxiety Level: Calm Cooperation: Cooperative Education Importance: Acknowledges Need Interest in Health Problems: Asks Questions Perception: Coherent Willingness to Engage in Self-Management High Activities: Readiness to Engage in Self-Management High Activities: Electronic Signature(s) Signed: 04/09/2020 5:05:08 PM By: Baruch Gouty RN, BSN Entered By: Baruch Gouty on 04/09/2020 13:21:05 -------------------------------------------------------------------------------- Fall Risk Assessment Details Patient Name: Date of Service: [name] 04/09/2020 12:30 PM Medical Record Number: [id number] Patient Account Number: [id number] Date of  Birth/Sex: Treating RN: 06-Apr-1970 (50 y.o. Female) Baruch Gouty Primary Care Yocelin Vanlue: [name] Other Clinician: Referring Brenisha Tsui: Treating Loza Prell/Extender: [name] in Treatment: 0 Fall Risk Assessment Items Have you had 2 or more falls in the last 12 monthso 0 No Have you had any fall that resulted in injury in the last 12 monthso 0 No FALLS RISK SCREEN History of falling - immediate or within 3 months 0 No Secondary diagnosis (Do you have 2 or more medical diagnoseso) 0 No Ambulatory aid None/bed rest/wheelchair/nurse 0 Yes Crutches/cane/walker 0 No Furniture 0  No Intravenous therapy Access/Saline/Heparin Lock 0 No Gait/Transferring Normal/ bed rest/ wheelchair 0 Yes Weak (short steps with or without shuffle, stooped but able to lift head while walking, may seek 0 No support from furniture) Impaired (short steps with shuffle, may have difficulty arising from chair, head down, impaired 0 No balance) Mental Status Oriented to own ability 0 Yes Electronic Signature(s) Signed: 04/09/2020 5:05:08 PM By: Baruch Gouty RN, BSN Entered By: Baruch Gouty on 04/09/2020 13:21:17 -------------------------------------------------------------------------------- Foot Assessment Details Patient Name: Date of Service: [name] 04/09/2020 12:30 PM Medical Record Number: [id number] Patient Account Number: [id number] Date of Birth/Sex: Treating RN: [birth year] (50 y.o. Female) Baruch Gouty Primary Care Refael Fulop: [name] Other Clinician: Referring Kamerin Grumbine: Treating Kebron Pulse/Extender: [name] in Treatment: 0 Foot Assessment Items Site Locations + = Sensation present, - = Sensation absent, C = Callus, U = Ulcer R = Redness, W = Warmth, M = Maceration, PU = Pre-ulcerative lesion F = Fissure, S = Swelling, D = Dryness Assessment Right: Left: Other Deformity: No No Prior Foot Ulcer: No  No Prior Amputation: No No Charcot Joint: No No Ambulatory Status: Ambulatory Without Help Gait: Steady Electronic Signature(s) Signed: 04/09/2020 5:05:08 PM By: Baruch Gouty RN, BSN Entered By: Baruch Gouty on 04/09/2020 13:21:59 -------------------------------------------------------------------------------- Nutrition Risk Screening Details Patient Name: Date of Service: [name] 04/09/2020 12:30 PM Medical Record Number: [id number] Patient Account Number: [id number] Date of Birth/Sex: Treating RN: May 02, 1970 (50 y.o. Female) Baruch Gouty Primary Care Bernardo Brayman: [name] Other Clinician: Referring Adley Mazurowski: Treating Ivey Cina/Extender: [name] in Treatment: 0 Height (in): 67 Weight (lbs): 257 Body Mass Index (BMI): 40.2 Nutrition Risk Screening Items Score Screening NUTRITION RISK SCREEN: I have an illness or condition that made me change the kind and/or amount of food I eat 2 Yes I eat fewer than two meals per day 3 Yes I eat few fruits and vegetables, or milk products 0 No I have three or more drinks of beer, liquor or wine almost every day 0 No I have tooth or mouth problems that make it hard for me to eat 0 No I don't always have enough money to buy the food I need 0 No I eat alone most of the time 0 No I take three or more different prescribed or over-the-counter drugs a day 1 Yes Without wanting to, I have lost or gained 10 pounds in the last six months 0 No I am not always physically able to shop, cook and/or feed myself 2 Yes Nutrition Protocols Good Risk Protocol Moderate Risk Protocol High Risk Proctocol 0 Provide education on nutrition Risk Level: High Risk Score: 8 Electronic Signature(s) Signed: 04/09/2020 5:05:08 PM By: Baruch Gouty RN, BSN Entered By: Baruch Gouty on 04/09/2020 13:21:51

## 2020-04-10 ENCOUNTER — Encounter: Payer: Self-pay | Admitting: Oncology

## 2020-04-10 ENCOUNTER — Other Ambulatory Visit: Payer: Self-pay | Admitting: Oncology

## 2020-04-10 NOTE — Progress Notes (Signed)
HONG, TIMM ([id number]) Visit Report for 04/09/2020 Chief Complaint Document Details Patient Name: Date of Service: [name] 04/09/2020 12:30 PM Medical Record Number: [id number] Patient Account Number: [id number] Date of Birth/Sex: Treating RN: April 08, 1970 (50 y.o. Female) [name] Primary Care Provider: Karle Khan Other Clinician: Referring Provider: Treating Provider/Extender: [name], Sarah Leisure in Treatment: 0 Information Obtained from: Patient Chief Complaint 04/09/2020; patient is here for review of an area of wound dehiscence in the left breast mastectomy scar Electronic Signature(s) Signed: 04/09/2020 4:57:40 PM By: Sarah Ham MD Entered By: [name] on 04/09/2020 14:28:12 -------------------------------------------------------------------------------- HPI Details Patient Name: Date of Service: Sarah Crochet V [name] 04/09/2020 12:30 PM Medical Record Number: [id number] Patient Account Number: [id number] Date of Birth/Sex: Treating RN: [birth year] (50 y.o. Female) [name] Primary Care Provider: Karle Khan Other Clinician: Referring Provider: Treating Provider/Extender: [name] in Treatment: 0 History of Present Illness HPI Description: ADMISSION 04/09/2020 This is a 50 year old woman who is history of breast cancer in the left breast goes back to 2018. He was found to have a mass in her left breast. She underwent a left back lumpectomy and then subsequent chemo that she did not tolerate very well. She had seed implants placed but then underwent external beam a radiation in the spring 2019. 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 Her adjunctive radiation was from 03/11/2017 through 04/21/2017. Unfortunately she had a left breast biopsy on  11/24/2019 and it was again positive for an invasive ductal carcinoma grade 3 he is to resume at receptor 80% positive. HER-2 negative progesterone negative. She opted for bilateral mastectomies which were done on 01/09/2020 on the right with no evidence of malignancy on the left stage IIb invasive ductal carcinoma. She again received chemotherapy but she did not tolerate it because of wound dehiscence and low platelet counts according to the patient. Unfortunately her mastectomy wound dehisced on the left breast. She was taken back to the OR by Dr. Donne Khan of general surgery on 33/24/22 and she underwent a debridement and subsequent attempt at closure. She had been previously treated with Augmentin for suspected underlying infection. Once again the wound has dehisced in the mid aspect. It is roughly 1 cm deep there is tunnels both medially and laterally she. She still has sutures in place. Still describes constant pain drainage. She also has a drain still in place that drains about 10 cc/day. Currently she is changing ABD pad several times a day Electronic Signature(s) Signed: 04/09/2020 4:57:40 PM By: Sarah Ham MD Entered By: [name] on 04/09/2020 14:33:33 -------------------------------------------------------------------------------- Physical Exam Details Patient Name: Date of Service: Sarah Leisure, [name] 04/09/2020 12:30 PM Medical Record Number: [id number] Patient Account Number: [id number] Date of Birth/Sex: Treating RN: Mar 06, 1970 (50 y.o. Female) [name] Primary Care Provider: Karle Khan Other Clinician: Referring Provider: Treating Provider/Extender: [name] in Treatment: 0 Constitutional Sitting or standing Blood Pressure is within target range for patient.. Pulse regular and within target range for patient.Marland Kitchen Respirations regular, non-labored and within target range.. Temperature is normal and within the target range for  the patient.Marland Kitchen Appears in no distress. Chest Bilateral mastectomies. BreastNo axillary adenopathy. Site is tightly fibrosed thickened nonelastic skin.Marland Kitchen Lymphatic No left axillary adenopathy. Notes Wound exam; there is areas in the  lower part of her surgical incision. There are still sutures in place this measures 4.5 x 1.8 x 1.1. There as noted there is tunneling. No obvious purulence no surrounding tenderness. I did not probe the area aggressively no debridement was done. There was drainage from the area this did not look particularly purulent however in necrotic debris on the surface of this did not look viable. I did a swab culture of this area and will address this when I see her again Electronic Signature(s) Signed: 04/09/2020 4:57:40 PM By: Sarah Ham MD Entered By: [name] on 04/09/2020 14:36:23 -------------------------------------------------------------------------------- Physician Orders Details Patient Name: Date of Service: Sarah Leisure, [name] 04/09/2020 12:30 PM Medical Record Number: [id number] Patient Account Number: [id number] Date of Birth/Sex: Treating RN: 1970/02/23 (50 y.o. Female) [name] Primary Care Provider: Karle Khan Other Clinician: Referring Provider: Treating Provider/Extender: [name] in Treatment: 0 Verbal / Phone Orders: No Diagnosis Coding Follow-up Appointments Return Appointment in 1 week. Bathing/ Shower/ Hygiene May shower with protection but do not get wound dressing(s) wet. Hyperbaric Oxygen Therapy Evaluate for HBO Therapy Indication: - soft tissue radionecrosis 2.5 ATA for 90 Minutes with 2 Five (5) Minute A Breaks ir Total Number of Treatments: - 40 One treatments per day (delivered Monday through Friday unless otherwise specified in Special Instructions below): Afrin (Oxymetazoline HCL) 0.05% nasal spray - 1 spray in both nostrils daily as needed prior to HBO treatment for  difficulty clearing ears Wound Treatment Wound #1 - Chest Wound Laterality: Left Cleanser: Normal Saline (DME) (Generic) Every Other Day/15 Days Discharge Instructions: Cleanse the wound with Normal Saline prior to applying a clean dressing using gauze sponges, not tissue or cotton balls. Prim Dressing: KerraCel Ag Gelling Fiber Dressing, 4x5 in (silver alginate) (DME) (Generic) Every Other Day/15 Days ary Discharge Instructions: Apply silver alginate to wound bed as instructed Secondary Dressing: Zetuvit Plus 4x4 in (DME) (Generic) Every Other Day/15 Days Discharge Instructions: Apply over primary dressing as directed. Laboratory naerobe culture (MICRO) - PCR culture Bacteria identified in Unspecified specimen by A LOINC Code: 903-0 Convenience Name: Anerobic culture Electronic Signature(s) Signed: 04/09/2020 4:57:40 PM By: Sarah Ham MD Signed: 04/10/2020 5:28:50 PM By: Sarah Hammock RN Entered By: [name] on 04/09/2020 14:40:37 -------------------------------------------------------------------------------- Problem List Details Patient Name: Date of Service: Sarah Crochet V [name] 04/09/2020 12:30 PM Medical Record Number: [id number] Patient Account Number: [id number] Date of Birth/Sex: Treating RN: 04/20/1970 (50 y.o. 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 04/09/2020 No Yes T81.31XD Disruption of external operation (surgical) wound, not elsewhere classified, 04/09/2020 No Yes subsequent encounter 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 and subcutaneous tissue related to 04/09/2020 No Yes radiation Z90.10 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 04/09/2020 No Yes Inactive  Problems Resolved Problems Electronic Signature(s) Signed: 04/09/2020 4:57:40 PM By: Sarah Ham MD Entered By: [name] on 04/09/2020 14:16:15 -------------------------------------------------------------------------------- Progress Note Details Patient Name: Date of Service: Sarah Crochet V [name] 04/09/2020 12:30 PM Medical Record Number: [id number] Patient Account Number: [id number] Date of Birth/Sex: Treating RN: 02/18/1970 (50 y.o. Female) [name] Primary Care Provider: Karle Khan Other Clinician: Referring Provider: Treating Provider/Extender: [name] in Treatment: 0 Subjective Chief Complaint Information obtained from Patient 04/09/2020;  patient is here for review of an area of wound dehiscence in the left breast mastectomy scar History of Present Illness (HPI) ADMISSION 04/09/2020 This is a 50 year old woman who is history of breast cancer in the left breast goes back to 2018. He was found to have a mass in her left breast. She underwent a left back lumpectomy and then subsequent chemo that she did not tolerate very well. She had seed implants placed but then underwent external beam a radiation in the spring 2019. 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 Her adjunctive radiation was from 03/11/2017 through 04/21/2017. Unfortunately she had a left breast biopsy on 11/24/2019 and it was again positive for an invasive ductal carcinoma grade 3 he is to resume at receptor 80% positive. HER-2 negative progesterone negative. She opted for bilateral mastectomies which were done on 01/09/2020 on the right with no evidence of malignancy on the left stage IIb invasive ductal carcinoma. She again received chemotherapy but she did not tolerate it because of wound dehiscence and low platelet counts according to  the patient. Unfortunately her mastectomy wound dehisced on the left breast. She was taken back to the OR by Dr. Donne Khan of general surgery on 33/24/22 and she underwent a debridement and subsequent attempt at closure. She had been previously treated with Augmentin for suspected underlying infection. Once again the wound has dehisced in the mid aspect. It is roughly 1 cm deep there is tunnels both medially and laterally she. She still has sutures in place. Still describes constant pain drainage. She also has a drain still in place that drains about 10 cc/day. Currently she is changing ABD pad several times a day Patient History Information obtained from Patient. Allergies carboplatin (Severity: Moderate, Reaction: red, blotchy) Family History Cancer - Father,Maternal Grandparents,Child, Diabetes - Mother,Father, Heart Disease - Father, Hypertension - Mother,Father, No family history of Kidney Disease, Lung Disease, Seizures, Stroke, Thyroid Problems, Tuberculosis. Social History Never smoker, Marital Status - Married, Alcohol Use - Never, Drug Use - No History, Caffeine Use - Rarely. Medical History Eyes Denies history of Cataracts, Glaucoma, Optic Neuritis Ear/Nose/Mouth/Throat Denies history of Chronic sinus problems/congestion, Middle ear problems Cardiovascular Patient has history of Hypertension - conditional Endocrine Denies history of Type I Diabetes, Type II Diabetes Genitourinary Denies history of End Stage Renal Disease Integumentary (Skin) Denies history of History of Burn Musculoskeletal Patient has history of Osteoarthritis Oncologic Patient has history of Received Chemotherapy - 2018, Received Radiation - 2019 Psychiatric Denies history of Anorexia/bulimia, Confinement Anxiety Hospitalization/Surgery History - portacath insertion. - ORIF right ankle. - left breast lumpectomy. - debridement and closure of mastectomy wound. - endoscopic mucosal resection. - mastectomy  with sentinel node biopsy. - tubal ligation. - wisdom tooth extraction. Medical A Surgical History Notes nd Constitutional Symptoms (General Health) obesity Gastrointestinal colon mass Oncologic breast CA with mets Review of Systems (ROS) Constitutional Symptoms (General Health) Denies complaints or symptoms of Fatigue, Fever, Chills, Marked Weight Change. Eyes Complains or has symptoms of Glasses / Contacts. Denies complaints or symptoms of Dry Eyes, Vision Changes. Ear/Nose/Mouth/Throat Denies complaints or symptoms of Chronic sinus problems or rhinitis. Respiratory Denies complaints or symptoms of Chronic or frequent coughs, Shortness of Breath. Cardiovascular Denies complaints or symptoms of Chest pain. Endocrine Denies complaints or symptoms of Heat/cold intolerance. Genitourinary Denies complaints or symptoms of Frequent urination. Integumentary (Skin) Complains or has symptoms of  Wounds - left breast. Musculoskeletal Denies complaints or symptoms of Muscle Pain, Muscle Weakness. Neurologic Denies complaints or symptoms of Numbness/parasthesias. Psychiatric Denies complaints or symptoms of Claustrophobia, Suicidal. Objective Constitutional Sitting or standing Blood Pressure is within target range for patient.. Pulse regular and within target range for patient.Marland Kitchen Respirations regular, non-labored and within target range.. Temperature is normal and within the target range for the patient.Marland Kitchen Appears in no distress. Vitals Time Taken: 1:00 PM, Height: 67 in, Source: Stated, Weight: 257 lbs, Source: Stated, BMI: 40.2, Temperature: 98.5 F, Pulse: 106 bpm, Respiratory Rate: 18 breaths/min, Blood Pressure: 122/80 mmHg. Chest Bilateral mastectomies. BreastNo axillary adenopathy. Site is tightly fibrosed thickened nonelastic skin.Marland Kitchen Lymphatic No left axillary adenopathy. General Notes: Wound exam; there is areas in the lower part of her surgical incision. There are still sutures  in place this measures 4.5 x 1.8 x 1.1. There as noted there is tunneling. No obvious purulence no surrounding tenderness. I did not probe the area aggressively no debridement was done. There was drainage from the area this did not look particularly purulent however in necrotic debris on the surface of this did not look viable. I did a swab culture of this area and will address this when I see her again Integumentary (Hair, Skin) Wound #1 status is Open. Original cause of wound was Surgical Injury. The date acquired was: 01/16/2020. The wound is located on the Left Chest. The wound measures 4.5cm length x 1.8cm width x 1.1cm depth; 6.362cm^2 area and 6.998cm^3 volume. There is Fat Layer (Subcutaneous Tissue) exposed. There is no undermining noted, however, there is tunneling at 3:00 with a maximum distance of 4.3cm. There is additional tunneling and at 9:00 with a maximum distance of 2.2cm. There is a medium amount of serosanguineous drainage noted. The wound margin is well defined and not attached to the wound base. There is no granulation within the wound bed. There is a large (67-100%) amount of necrotic tissue within the wound bed including Eschar and Adherent Slough. Assessment Active Problems ICD-10 Intraductal carcinoma in situ of left breast Disruption of external operation (surgical) wound, not elsewhere classified, subsequent encounter 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 Plan Follow-up Appointments: Return Appointment in 1 week. Bathing/ Shower/ Hygiene: May shower with protection but do not get wound dressing(s) wet. Hyperbaric Oxygen Therapy: Other - We are going to see if insurance will cover HYPERBARICS. Laboratory ordered were: Anerobic culture - PCR culture WOUND #1: - Chest Wound Laterality: Left Cleanser: Normal Saline (DME) (Generic) Every Other Day/15 Days Discharge  Instructions: Cleanse the wound with Normal Saline prior to applying a clean dressing using gauze sponges, not tissue or cotton balls. Prim Dressing: KerraCel Ag Gelling Fiber Dressing, 4x5 in (silver alginate) (DME) (Generic) Every Other Day/15 Days ary Discharge Instructions: Apply silver alginate to wound bed as instructed Secondary Dressing: Zetuvit Plus 4x4 in (DME) (Generic) Every Other Day/15 Days Discharge Instructions: Apply over primary dressing as directed. 1. I am going to lightly pack the area with Kerracel AG which is both antibacterial and absorptive and also later this over the top. Back this visit to visit which is also fairly absorptive. 2. She sees Dr. Donne Khan on Thursday I will let Dr. Donne Khan deal with the sutures 3. At some point I think this patient is going to require additional debridement as I said I did not probe this wound deeply and I do not really know if there  is viable tissue present. 4. Almost a certainty that she has radiation damage contributing to the nonhealing status of this area. As such she might benefit from hyperbaric oxygen. She has Medicaid with some special coverage of breast cancer will have to run this through insurance but the patient seems particularly eager to proceed and in fact she had already googled this and made the arrangements to come in herself 5. I did swab culture this and will address this when the culture comes back. I am a firm believer in topical antibiotics as well if this should become necessary. 45 minutes in review this patient's complicated past medical record, face-to-face evaluation and preparation of this record Electronic Signature(s) Signed: 04/09/2020 4:57:40 PM By: Sarah Ham MD Entered By: [name] on 04/09/2020 14:39:59 -------------------------------------------------------------------------------- HxROS Details Patient Name: Date of Service: Sarah Crochet V [name] 04/09/2020 12:30 PM Medical Record  Number: [id number] Patient Account Number: [id number] Date of Birth/Sex: Treating RN: Mar 21, 1970 (50 y.o. Female) Baruch Gouty Primary Care Provider: Karle Khan Other Clinician: Referring Provider: Treating Provider/Extender: [name] in Treatment: 0 Information Obtained From Patient Constitutional Symptoms (General Health) Complaints and Symptoms: Negative for: Fatigue; Fever; Chills; Marked Weight Change Medical History: Past Medical History Notes: obesity Eyes Complaints and Symptoms: Positive for: Glasses / Contacts Negative for: Dry Eyes; Vision Changes Medical History: Negative for: Cataracts; Glaucoma; Optic Neuritis Ear/Nose/Mouth/Throat Complaints and Symptoms: Negative for: Chronic sinus problems or rhinitis Medical History: Negative for: Chronic sinus problems/congestion; Middle ear problems Respiratory Complaints and Symptoms: Negative for: Chronic or frequent coughs; Shortness of Breath Cardiovascular Complaints and Symptoms: Negative for: Chest pain Medical History: Positive for: Hypertension - conditional Endocrine Complaints and Symptoms: Negative for: Heat/cold intolerance Medical History: Negative for: Type I Diabetes; Type II Diabetes Genitourinary Complaints and Symptoms: Negative for: Frequent urination Medical History: Negative for: End Stage Renal Disease Integumentary (Skin) Complaints and Symptoms: Positive for: Wounds - left breast Medical History: Negative for: History of Burn Musculoskeletal Complaints and Symptoms: Negative for: Muscle Pain; Muscle Weakness Medical History: Positive for: Osteoarthritis Neurologic Complaints and Symptoms: Negative for: Numbness/parasthesias Psychiatric Complaints and Symptoms: Negative for: Claustrophobia; Suicidal Medical History: Negative for: Anorexia/bulimia; Confinement Anxiety Hematologic/Lymphatic Gastrointestinal Medical History: Past Medical  History Notes: colon mass Immunological Oncologic Medical History: Positive for: Received Chemotherapy - 2018; Received Radiation - 2019 Past Medical History Notes: breast CA with mets Immunizations Pneumococcal Vaccine: Received Pneumococcal Vaccination: No Implantable Devices Yes Hospitalization / Surgery History Type of Hospitalization/Surgery portacath insertion ORIF right ankle left breast lumpectomy debridement and closure of mastectomy wound endoscopic mucosal resection mastectomy with sentinel node biopsy tubal ligation wisdom tooth extraction Family and Social History Cancer: Yes - Father,Maternal Grandparents,Child; Diabetes: Yes - Mother,Father; Heart Disease: Yes - Father; Hypertension: Yes - Mother,Father; Kidney Disease: No; Lung Disease: No; Seizures: No; Stroke: No; Thyroid Problems: No; Tuberculosis: No; Never smoker; Marital Status - Married; Alcohol Use: Never; Drug Use: No History; Caffeine Use: Rarely; Financial Concerns: No; Food, Clothing or Shelter Needs: No; Support System Lacking: No; Transportation Concerns: No Engineer, maintenance) Signed: 04/09/2020 4:57:40 PM By: Sarah Ham MD Signed: 04/09/2020 5:05:08 PM By: Baruch Gouty RN, BSN Entered By: Baruch Gouty on 04/09/2020 13:17:45 -------------------------------------------------------------------------------- SuperBill Details Patient Name: Date of Service: Sarah Crochet V [name] 04/09/2020 Medical Record Number: [id number] Patient Account Number: [id number] Date of Birth/Sex: Treating RN: [birth year] (50 y.o. 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 T81.31XD Disruption of external operation (surgical) wound, not elsewhere classified,  subsequent encounter 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 Facility Procedures CPT4 Code: [id number] Description: 99214 - WOUND CARE VISIT-LEV 4 EST PT Modifier: Quantity: 1 Physician Procedures : CPT4 Code Description Modifier [id number] - WC PHYS LEVEL 4 - NEW PT ICD-10 Diagnosis Description 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

## 2020-04-11 ENCOUNTER — Other Ambulatory Visit (HOSPITAL_COMMUNITY): Payer: Self-pay

## 2020-04-11 MED ORDER — TRAMADOL HCL 50 MG PO TABS
ORAL_TABLET | Freq: Four times a day (QID) | ORAL | 0 refills | Status: DC | PRN
  Filled 2020-04-11: qty 20, 3d supply, fill #0

## 2020-04-11 NOTE — Progress Notes (Signed)
IFEOMA, VALLIN ([id number]) Visit Report for 04/09/2020 Allergy List Details Patient Name: Date of Service: [name] 04/09/2020 12:30 PM Medical Record Number: [id number] Patient Account Number: [id number] Date of Birth/Sex: Treating RN: 1970-11-26 (50 y.o. Female) Baruch Gouty Primary Care Ashly Goethe: [name] Other Clinician: Referring Marnita Poirier: Treating Sundee Garland/Extender: [name] in Treatment: 0 Allergies Active Allergies carboplatin Reaction: red, blotchy Severity: Moderate Allergy Notes Electronic Signature(s) Signed: 04/09/2020 5:05:08 PM By: Baruch Gouty RN, BSN Entered By: Baruch Gouty on 04/09/2020 13:04:08 -------------------------------------------------------------------------------- Arrival Information Details Patient Name: Date of Service: [name] 04/09/2020 12:30 PM Medical Record Number: [id number] Patient Account Number: [id number] Date of Birth/Sex: Treating RN: January 19, 1970 (50 y.o. Female) Baruch Gouty Primary Care Darrelyn Morro: [name] Other Clinician: Referring Ayo Guarino: Treating Lupe Handley/Extender: [name] in Treatment: 0 Visit Information Patient Arrived: Ambulatory Arrival Time: 12:50 Accompanied By: self Transfer Assistance: None Patient Identification Verified: Yes Secondary Verification Process Completed: Yes Patient Requires Transmission-Based Precautions: No Patient Has Alerts: No Electronic Signature(s) Signed: 04/09/2020 5:05:08 PM By: Baruch Gouty RN, BSN Entered By: Baruch Gouty on 04/09/2020 13:01:52 -------------------------------------------------------------------------------- Clinic Level of Care Assessment Details Patient Name: Date of Service: A Lourdes Sledge, [name] 04/09/2020 12:30 PM Medical Record Number: [id number] Patient Account Number: [id number] Date of Birth/Sex: Treating RN: May 16, 1970 (50 y.o. Female)  Rhae Hammock Primary Care Kollyns Mickelson: [name] Other Clinician: Referring Tina Gruner: Treating Cortney Beissel/Extender: [name] in Treatment: 0 Clinic Level of Care Assessment Items TOOL 2 Quantity Score X- 1 0 Use when only an EandM is performed on the INITIAL visit ASSESSMENTS - Nursing Assessment / Reassessment X- 1 20 General Physical Exam (combine w/ comprehensive assessment (listed just below) when performed on new pt. evals) X- 1 25 Comprehensive Assessment (HX, ROS, Risk Assessments, Wounds Hx, etc.) ASSESSMENTS - Wound and Skin A ssessment / Reassessment X - Simple Wound Assessment / Reassessment - one wound 1 5 []  - 0 Complex Wound Assessment / Reassessment - multiple wounds X- 1 10 Dermatologic / Skin Assessment (not related to wound area) ASSESSMENTS - Ostomy and/or Continence Assessment and Care []  - 0 Incontinence Assessment and Management []  - 0 Ostomy Care Assessment and Management (repouching, etc.) PROCESS - Coordination of Care X - Simple Patient / Family Education for ongoing care 1 15 []  - 0 Complex (extensive) Patient / Family Education for ongoing care X- 1 10 Staff obtains Programmer, systems, Records, T Results / Process Orders est []  - 0 Staff telephones HHA, Nursing Homes / Clarify orders / etc []  - 0 Routine Transfer to another Facility (non-emergent condition) []  - 0 Routine Hospital Admission (non-emergent condition) X- 1 15 New Admissions / Biomedical engineer / Ordering NPWT Apligraf, etc. , []  - 0 Emergency Hospital Admission (emergent condition) X- 1 10 Simple Discharge Coordination []  - 0 Complex (extensive) Discharge Coordination PROCESS - Special Needs []  - 0 Pediatric / Minor Patient Management []  - 0 Isolation Patient Management []  - 0 Hearing / Language / Visual special needs []  - 0 Assessment of Community assistance (transportation, D/C planning, etc.) []  - 0 Additional assistance / Altered  mentation []  - 0 Support Surface(s) Assessment (bed, cushion, seat, etc.) INTERVENTIONS - Wound Cleansing / Measurement X- 1 5 Wound Imaging (photographs - any number of wounds) []  - 0 Wound Tracing (instead of photographs) X- 1 5 Simple Wound Measurement - one wound []  - 0 Complex Wound Measurement -  multiple wounds X- 1 5 Simple Wound Cleansing - one wound []  - 0 Complex Wound Cleansing - multiple wounds INTERVENTIONS - Wound Dressings X - Small Wound Dressing one or multiple wounds 1 10 []  - 0 Medium Wound Dressing one or multiple wounds []  - 0 Large Wound Dressing one or multiple wounds []  - 0 Application of Medications - injection INTERVENTIONS - Miscellaneous []  - 0 External ear exam []  - 0 Specimen Collection (cultures, biopsies, blood, body fluids, etc.) []  - 0 Specimen(s) / Culture(s) sent or taken to Lab for analysis []  - 0 Patient Transfer (multiple staff / Civil Service fast streamer / Similar devices) []  - 0 Simple Staple / Suture removal (25 or less) []  - 0 Complex Staple / Suture removal (26 or more) []  - 0 Hypo / Hyperglycemic Management (close monitor of Blood Glucose) []  - 0 Ankle / Brachial Index (ABI) - do not check if billed separately Has the patient been seen at the hospital within the last three years: Yes Total Score: 135 Level Of Care: New/Established - Level 4 Electronic Signature(s) Signed: 04/10/2020 5:28:50 PM By: Rhae Hammock RN Entered By: Rhae Hammock on 04/09/2020 14:03:24 -------------------------------------------------------------------------------- Encounter Discharge Information Details Patient Name: Date of Service: [name] 04/09/2020 12:30 PM Medical Record Number: [id number] Patient Account Number: [id number] Date of Birth/Sex: Treating RN: 05-19-1970 (50 y.o. Female) Lorrin Jackson Primary Care Avarie Tavano: [name] Other Clinician: Referring Aireanna Luellen: Treating Kaysi Ourada/Extender: [name] in Treatment: 0 Encounter Discharge Information Items Discharge Condition: Stable Ambulatory Status: Ambulatory Discharge Destination: Home Transportation: Private Auto Schedule Follow-up Appointment: Yes Clinical Summary of Care: Provided on 04/09/2020 Form Type Recipient Paper Patient Patient Electronic Signature(s) Signed: 04/09/2020 2:27:36 PM By: Lorrin Jackson Entered By: Lorrin Jackson on 04/09/2020 14:27:36 -------------------------------------------------------------------------------- Lower Extremity Assessment Details Patient Name: Date of Service: [name] 04/09/2020 12:30 PM Medical Record Number: [id number] Patient Account Number: [id number] Date of Birth/Sex: Treating RN: [birth year] (50 y.o. Female) Baruch Gouty Primary Care Lamoyne Palencia: [name] Other Clinician: Referring Roshard Rezabek: Treating Imunique Samad/Extender: [name] in Treatment: 0 Electronic Signature(s) Signed: 04/09/2020 5:05:08 PM By: Baruch Gouty RN, BSN Entered By: Baruch Gouty on 04/09/2020 13:22:06 -------------------------------------------------------------------------------- Multi Wound Chart Details Patient Name: Date of Service: [name] 04/09/2020 12:30 PM Medical Record Number: [id number] Patient Account Number: [id number] Date of Birth/Sex: Treating RN: [birth year] (50 y.o. 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)]  [N/A:N/A] Secondary Etiology: [1:Hypertension, Osteoarthritis, ReceivedN/A] Comorbid History: [1:Chemotherapy, Received Radiation 01/16/2020] [N/A:N/A] Date Acquired: [1:0] [N/A:N/A] Weeks of Treatment: [1:Open] [N/A:N/A] Wound Status: [1:4.5x1.8x1.1] [N/A:N/A] Measurements L x W x D (cm) [1:6.362] [N/A:N/A] A (cm) : rea [1:6.998] [N/A:N/A] Volume (cm) : [1:3] Position 1 (o'clock): [1:4.3] Maximum Distance 1 (cm): [1:9] Position 2 (o'clock): [1:2.2] Maximum Distance 2 (cm): [1:Yes] [N/A:N/A] Tunneling: [1:Full Thickness Without Exposed] [N/A:N/A] Classification: [1:Support Structures Medium] [N/A:N/A] Exudate A mount: [1:Serosanguineous] [N/A:N/A] Exudate Type: [1:red, brown] [N/A:N/A] Exudate Color: [1:Well defined, not attached] [N/A:N/A] Wound Margin: [1:None Present (0%)] [N/A:N/A] Granulation Amount: [1:Large (67-100%)] [N/A:N/A] Necrotic Amount: [1:Eschar, Adherent Slough] [N/A:N/A] Necrotic Tissue: [1:Fat Layer (  Subcutaneous Tissue): Yes N/A] Exposed Structures: [1:Fascia: No Tendon: No Muscle: No Joint: No Bone: No None] [N/A:N/A] Treatment Notes Wound #1 (Chest) Wound Laterality: Left Cleanser Normal Saline Discharge Instruction: Cleanse the wound with Normal Saline prior to applying a clean dressing using gauze sponges, not tissue or cotton balls. Peri-Wound Care Topical Primary Dressing KerraCel Ag Gelling Fiber Dressing, 4x5 in (silver alginate) Discharge Instruction: Apply silver alginate to wound bed as instructed Secondary Dressing Zetuvit Plus 4x4 in Discharge Instruction: Apply over primary dressing as directed. Secured With Compression Wrap Compression Stockings Environmental education officer) Signed: 04/09/2020 4:57:40 PM By: [name] MD Signed: 04/10/2020 5:28:50 PM By: Rhae Hammock RN Entered By: [name] on 04/09/2020 14:27:50 -------------------------------------------------------------------------------- Multi-Disciplinary Care  Plan Details Patient Name: Date of Service: [name], [name] 04/09/2020 12:30 PM Medical Record Number: [id number] Patient Account Number: [id number] Date of Birth/Sex: Treating RN: [birth year] (50 y.o. Female) Rhae Hammock Primary Care Micky Sheller: [name] Other Clinician: Referring Haydon Kalmar: Treating Barlow Harrison/Extender: [name] in Treatment: 0 Active Inactive Orientation to the Wound Care Program Nursing Diagnoses: Knowledge deficit related to the wound healing center program Goals: Patient/caregiver will verbalize understanding of the [place] Date Initiated: 04/09/2020 Target Resolution Date: 05/11/2020 Goal Status: Active Interventions: Provide education on orientation to the wound center Notes: Wound/Skin Impairment Nursing Diagnoses: Impaired tissue integrity Knowledge deficit related to smoking impact on wound healing Knowledge deficit related to ulceration/compromised skin integrity Goals: Patient will demonstrate a reduced rate of smoking or cessation of smoking Date Initiated: 04/09/2020 Target Resolution Date: 05/11/2020 Goal Status: Active Patient will have a decrease in wound volume by X% from date: (specify in notes) Date Initiated: 04/09/2020 Target Resolution Date: 05/11/2020 Goal Status: Active Patient/caregiver will verbalize understanding of skin care regimen Date Initiated: 04/09/2020 Target Resolution Date: 05/11/2020 Goal Status: Active Ulcer/skin breakdown will have a volume reduction of 30% by week 4 Date Initiated: 04/09/2020 Target Resolution Date: 05/11/2020 Goal Status: Active Interventions: Assess patient/caregiver ability to obtain necessary supplies Assess patient/caregiver ability to perform ulcer/skin care regimen upon admission and as needed Assess ulceration(s) every visit Provide education on smoking Notes: Electronic Signature(s) Signed: 04/10/2020 5:28:50 PM By: Rhae Hammock  RN Entered By: Rhae Hammock on 04/09/2020 13:29:06 -------------------------------------------------------------------------------- Pain Assessment Details Patient Name: Date of Service: [name] 04/09/2020 12:30 PM Medical Record Number: [id number] Patient Account Number: [id number] Date of Birth/Sex: Treating RN: 07/09/1970 (50 y.o. Female) Baruch Gouty Primary Care Cadi Rhinehart: [name] Other Clinician: Referring Shelbee Apgar: Treating Shaketta Rill/Extender: [name] in Treatment: 0 Active Problems Location of Pain Severity and Description of Pain Patient Has Paino Yes Site Locations Pain Location: Pain in Ulcers With Dressing Change: Yes Duration of the Pain. Constant / Intermittento Constant Rate the pain. Current Pain Level: 5 Worst Pain Level: 6 Least Pain Level: 3 Character of Pain Describe the Pain: Aching Pain Management and Medication Current Pain Management: Medication: Yes Is the Current Pain Management Adequate: Adequate How does your wound impact your activities of daily livingo Sleep: Yes Bathing: No Appetite: No Relationship With Others: No Bladder Continence: No Emotions: No Bowel Continence: No Work: Yes Toileting: No Drive: Yes Dressing: No Hobbies: No Engineer, maintenance) Signed: 04/09/2020 5:05:08 PM By: Baruch Gouty RN, BSN Entered By: Baruch Gouty on 04/09/2020 13:36:04 -------------------------------------------------------------------------------- Patient/Caregiver Education Details Patient Name: Date of Service: [name] 4/5/2022andnbsp12:30 PM Medical Record Number: [id number] Patient Account  Number: [id number] Date of Birth/Gender: Treating RN: May 04, 1970 (50 y.o. Female) Rhae Hammock Primary Care Physician: [name] Other Clinician: Referring Physician: Treating Physician/Extender: [name] in Treatment:  0 Education Assessment Education Provided To: Patient Education Topics Provided Smoking and Wound Healing: Methods: Explain/Verbal Responses: State content correctly Welcome T [facility] [place]: o Methods: Explain/Verbal Responses: State content correctly Electronic Signature(s) Signed: 04/10/2020 5:28:50 PM By: Rhae Hammock RN Entered By: Rhae Hammock on 04/09/2020 13:53:16 -------------------------------------------------------------------------------- Wound Assessment Details Patient Name: Date of Service: [name] 04/09/2020 12:30 PM Medical Record Number: [id number] Patient Account Number: [id number] Date of Birth/Sex: Treating RN: [birth year] (50 y.o. Female) Baruch Gouty Primary Care Rinaldo Macqueen: [name] Other Clinician: Referring Ireland Chagnon: Treating Myia Bergh/Extender: [name] in Treatment: 0 Wound Status Wound Number: 1 Primary Open Surgical Wound Etiology: Wound Location: Left Chest Secondary Necrosis (Radiation) Wounding Event: Surgical Injury Etiology: Date Acquired: 01/16/2020 Wound Status: Open Weeks Of Treatment: 0 Comorbid Hypertension, Osteoarthritis, Received Chemotherapy, Clustered Wound: No History: Received Radiation Photos Wound Measurements Length: (cm) 4.5 Width: (cm) 1.8 Depth: (cm) 1.1 Area: (cm) 6.362 Volume: (cm) 6.998 % Reduction in Area: 0% % Reduction in Volume: 0% Epithelialization: None Tunneling: Yes Location 1 Position (o'clock): 3 Maximum Distance: (cm) 4.3 Location 2 Position (o'clock): 9 Maximum Distance: (cm) 2.2 Undermining: No Wound Description Classification: Full Thickness Without Exposed Support Structures Wound Margin: Well defined, not attached Exudate Amount: Medium Exudate Type: Serosanguineous Exudate Color: red, brown Foul Odor After Cleansing: No Slough/Fibrino Yes Wound Bed Granulation Amount: None Present (0%) Exposed  Structure Necrotic Amount: Large (67-100%) Fascia Exposed: No Necrotic Quality: Eschar, Adherent Slough Fat Layer (Subcutaneous Tissue) Exposed: Yes Tendon Exposed: No Muscle Exposed: No Joint Exposed: No Bone Exposed: No Treatment Notes Wound #1 (Chest) Wound Laterality: Left Cleanser Normal Saline Discharge Instruction: Cleanse the wound with Normal Saline prior to applying a clean dressing using gauze sponges, not tissue or cotton balls. Peri-Wound Care Topical Primary Dressing KerraCel Ag Gelling Fiber Dressing, 4x5 in (silver alginate) Discharge Instruction: Apply silver alginate to wound bed as instructed Secondary Dressing Zetuvit Plus 4x4 in Discharge Instruction: Apply over primary dressing as directed. Secured With Compression Wrap Compression Stockings Environmental education officer) Signed: 04/09/2020 5:05:08 PM By: Baruch Gouty RN, BSN Signed: 04/11/2020 4:58:23 PM By: Sandre Kitty Entered By: Sandre Kitty on 04/09/2020 16:21:00 -------------------------------------------------------------------------------- Vitals Details Patient Name: Date of Service: [name], [name] 04/09/2020 12:30 PM Medical Record Number: [id number] Patient Account Number: [id number] Date of Birth/Sex: Treating RN: [birth year] (50 y.o. Female) Baruch Gouty Primary Care Konnie Noffsinger: [name] Other Clinician: Referring Jeanean Hollett: Treating Celedonio Sortino/Extender: [name] in Treatment: 0 Vital Signs Time Taken: 13:00 Temperature (F): 98.5 Height (in): 67 Pulse (bpm): 106 Source: Stated Respiratory Rate (breaths/min): 18 Weight (lbs): 257 Blood Pressure (mmHg): 122/80 Source: Stated Reference Range: 80 - 120 mg / dl Body Mass Index (BMI): 40.2 Electronic Signature(s) Signed: 04/09/2020 5:05:08 PM By: Baruch Gouty RN, BSN Entered By: Baruch Gouty on 04/09/2020 13:02:40

## 2020-04-12 ENCOUNTER — Other Ambulatory Visit (HOSPITAL_COMMUNITY): Payer: Self-pay

## 2020-04-12 MED ORDER — AMOXICILLIN-POT CLAVULANATE 875-125 MG PO TABS
ORAL_TABLET | ORAL | 0 refills | Status: DC
  Filled 2020-04-12: qty 14, 7d supply, fill #0

## 2020-04-15 ENCOUNTER — Other Ambulatory Visit: Payer: Self-pay | Admitting: Internal Medicine

## 2020-04-15 ENCOUNTER — Other Ambulatory Visit: Payer: Self-pay

## 2020-04-15 ENCOUNTER — Ambulatory Visit
Admission: RE | Admit: 2020-04-15 | Discharge: 2020-04-15 | Disposition: A | Discharge status: Home / Self Care | Payer: Medicaid Other | Source: Ambulatory Visit | Attending: Internal Medicine | Admitting: Internal Medicine

## 2020-04-15 DIAGNOSIS — Z5189 Encounter for other specified aftercare: Secondary | ICD-10-CM

## 2020-04-16 ENCOUNTER — Telehealth: Payer: Self-pay | Admitting: Oncology

## 2020-04-16 ENCOUNTER — Other Ambulatory Visit: Payer: Self-pay | Admitting: Oncology

## 2020-04-16 ENCOUNTER — Encounter (HOSPITAL_BASED_OUTPATIENT_CLINIC_OR_DEPARTMENT_OTHER): Payer: Medicaid Other | Admitting: Internal Medicine

## 2020-04-16 ENCOUNTER — Other Ambulatory Visit (HOSPITAL_COMMUNITY): Payer: Self-pay

## 2020-04-16 DIAGNOSIS — N641 Fat necrosis of breast: Secondary | ICD-10-CM | POA: Diagnosis not present

## 2020-04-16 MED ORDER — AMOXICILLIN-POT CLAVULANATE 875-125 MG PO TABS
ORAL_TABLET | ORAL | 0 refills | Status: DC
  Filled 2020-04-16: qty 14, 7d supply, fill #0

## 2020-04-16 MED FILL — Gabapentin Cap 100 MG: ORAL | 30 days supply | Qty: 120 | Fill #0 | Status: AC

## 2020-04-16 NOTE — Telephone Encounter (Signed)
Scheduled appt per 4/8 sch msg. Called pt, no answer. Left msg with appt date and time.

## 2020-04-16 NOTE — Progress Notes (Signed)
DARLINDA, BELLOWS ([id number]) Visit Report for 04/16/2020 HPI Details Patient Name: Date of Service: [name] 04/16/2020 7:30 A M Medical Record Number: [id number] Patient Account Number: [id number] Date of Birth/Sex: Treating RN: 19-Jun-1970 (50 y.o. [name], [name] Primary Care Provider: [name] Other Clinician: Referring Provider: Treating Provider/Extender: [name] in Treatment: 1 History of Present Illness HPI Description: ADMISSION 04/09/2020 This is a 50 year old woman who is history of breast cancer in the left breast goes back to 2018. He was found to have a mass in her left breast. She underwent a left back lumpectomy and then subsequent chemo that she did not tolerate very well. She had seed implants placed but then underwent external beam a radiation in the spring 2019. 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 Her adjunctive radiation was from 03/11/2017 through 04/21/2017. Unfortunately she had a left breast biopsy on 11/24/2019 and it was again positive for an invasive ductal carcinoma grade 3 he is to resume at receptor 80% positive. HER-2 negative progesterone negative. She opted for bilateral mastectomies which were done on 01/09/2020 on the right with no evidence of malignancy on the left stage IIb invasive ductal carcinoma. She again received chemotherapy but she did not tolerate it because of wound dehiscence and low platelet counts according to the patient. Unfortunately her mastectomy wound dehisced on the left breast. She was taken back to the OR by [name] of general surgery on 33/24/22 and she underwent a debridement and subsequent attempt at closure. She had been previously treated with Augmentin for suspected underlying infection. Once again the wound has dehisced in the mid  aspect. It is roughly 1 cm deep there is tunnels both medially and laterally she. She still has sutures in place. Still describes constant pain drainage. She also has a drain still in place that drains about 10 cc/day. Currently she is changing ABD pad several times a day 4/12; patient went back to see [name] who remove most of the stitches and debrided her wound. She has been using silver alginate culture I did last time which was a PCR culture showed Peptostreptococcus, Enterococcus faecalis and Staphylococcus epidermidis which is of questionable significance. She has a larger wound now that the sutures have been removed. There are undermining areas superior medial superior lateral and a deep tunneling area in the inferior part of the wound. There is no obvious purulence. I started her on Augmentin on 04/12/2020 and I will likely extend this for another week AMAZINGLY; she is already been approved for HBO and will start today. [name] Echocardiogram which I reviewed from 2019 showed a normal ejection fraction.. I went over side effects of HBO with the patient and answered any of her questions. Her husband was present Engineer, maintenance) Signed: 04/16/2020 4:33:39 PM By: [name] MD Entered By: [name] on 04/16/2020 09:17:22 -------------------------------------------------------------------------------- Physical Exam Details Patient Name: Date of Service: [name], [name] 04/16/2020 7:30 A M Medical Record Number: [id number] Patient Account Number: [id number] Date of Birth/Sex: Treating RN: 1970/03/23 (50 y.o. [name], [name] Primary Care Provider: [name] Other Clinician: Referring Provider: Treating Provider/Extender: [name] in Treatment: 1 Constitutional Sitting or standing Blood Pressure is within target range for patient.. Pulse regular and within target range for patient.Marland Kitchen  Respirations  regular, non-labored and within target range.. Temperature is normal and within the target range for the patient.Marland Kitchen Appears in no distress. Notes Wound exam; most of the sutures have been removed. There is still necrotic debris on the surface but much less than last time. She has undermining at roughly 10:00 2:00 and a deeper area at 4:00 in the lower part of the wound. The wound was vigorously washed with Anasept and gauze. There is still some nonviable tissue here. Skin changes surrounding the wound of radiation injury. Electronic Signature(s) Signed: 04/16/2020 4:33:39 PM By: [name] MD Entered By: [name] on 04/16/2020 09:16:27 -------------------------------------------------------------------------------- Physician Orders Details Patient Name: Date of Service: [name] 04/16/2020 7:30 A M Medical Record Number: [id number] Patient Account Number: [id number] Date of Birth/Sex: Treating RN: 1970/12/02 (50 y.o. 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 - 05/07/2020 Nurse Visit: - 04/23/2020 04/30/2020 Bathing/ Shower/ Hygiene May shower with protection but do not get wound dressing(s) wet. Hyperbaric Oxygen Therapy Evaluate for HBO Therapy Indication: - soft tissue radionecrosis 2.5 ATA for 90 Minutes with 2 Five (5) Minute A Breaks ir Total Number of Treatments: - 40 One treatments per day  (delivered Monday through Friday unless otherwise specified in Special Instructions below): Afrin (Oxymetazoline HCL) 0.05% nasal spray - 1 spray in both nostrils daily as needed prior to HBO treatment for difficulty clearing ears Wound Treatment Wound #1 - Chest Wound Laterality: Left Cleanser: Normal Saline (Generic) Every Other Day/15 Days Discharge Instructions: Cleanse the wound with Normal Saline prior to applying a clean dressing using gauze sponges, not tissue or cotton balls. Prim Dressing: KerraCel Ag Gelling Fiber Dressing, 4x5 in (silver alginate) (Generic) Every Other Day/15 Days ary Discharge Instructions: Ensure to pack into tunnels and undermining. Apply silver alginate to wound bed as instructed Secondary Dressing: Zetuvit Plus 4x4 in (Generic) Every Other Day/15 Days Discharge Instructions: Apply over primary dressing as directed. Patient Medications llergies: carboplatin A Notifications Medication Indication Start End 04/16/2020 Augmentin DOSE oral 875 mg-125 mg tablet - 1 tablet oral bid for a further 7 days (continuing rx) Electronic Signature(s) Signed: 04/16/2020 9:19:10 AM By: [name] MD Entered By: [name] on 04/16/2020 09:19:10 -------------------------------------------------------------------------------- Problem List Details Patient Name: Date of Service: [name] 04/16/2020 7:30 A M Medical Record Number: [id number] Patient Account Number: [id number] Date of Birth/Sex: Treating RN: 1970/02/22 (50 y.o. 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 04/09/2020 No Yes T81.31XD Disruption of external operation (surgical) wound, not elsewhere classified, 04/09/2020 No  Yes subsequent encounter 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 and subcutaneous tissue related to 04/09/2020 No Yes radiation Z90.10 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 04/09/2020 No Yes Inactive Problems Resolved Problems Electronic Signature(s) Signed: 04/16/2020 4:33:39 PM By: [name] MD Entered By: [name] on 04/16/2020 09:12:34 --------------------------------------------------------------------------------  Progress Note Details Patient Name: Date of Service: [name] 04/16/2020 7:30 A M Medical Record Number: [id number] Patient Account Number: [id number] Date of Birth/Sex: Treating RN: 04-30-1970 (50 y.o. [name], [name] Primary Care Provider: [name] Other Clinician: Referring Provider: Treating Provider/Extender: [name] in Treatment: 1 Subjective History of Present Illness (HPI) ADMISSION 04/09/2020 This is a 50 year old woman who is history of breast cancer in the left breast goes back to 2018. He was found to have a mass in her left breast. She underwent a left back lumpectomy and then subsequent chemo that she did not tolerate very well. She had seed implants placed but then underwent external beam a radiation in the spring 2019. 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 Her adjunctive radiation was from 03/11/2017 through 04/21/2017. Unfortunately she had a left breast biopsy on 11/24/2019 and it was again positive for an invasive ductal carcinoma grade 3 he is to resume at receptor 80% positive. HER-2 negative progesterone negative. She opted for bilateral mastectomies which were done on 01/09/2020 on the right with no evidence of malignancy on the left stage IIb invasive ductal carcinoma. She again received chemotherapy but she did not tolerate it  because of wound dehiscence and low platelet counts according to the patient. Unfortunately her mastectomy wound dehisced on the left breast. She was taken back to the OR by [name] of general surgery on 33/24/22 and she underwent a debridement and subsequent attempt at closure. She had been previously treated with Augmentin for suspected underlying infection. Once again the wound has dehisced in the mid aspect. It is roughly 1 cm deep there is tunnels both medially and laterally she. She still has sutures in place. Still describes constant pain drainage. She also has a drain still in place that drains about 10 cc/day. Currently she is changing ABD pad several times a day 4/12; patient went back to see [name] who remove most of the stitches and debrided her wound. She has been using silver alginate culture I did last time which was a PCR culture showed Peptostreptococcus, Enterococcus faecalis and Staphylococcus epidermidis which is of questionable significance. She has a larger wound now that the sutures have been removed. There are undermining areas superior medial superior lateral and a deep tunneling area in the inferior part of the wound. There is no obvious purulence. I started her on Augmentin on 04/12/2020 and I will likely extend this for another week AMAZINGLY; she is already been approved for HBO and will start today. [name] Echocardiogram which I reviewed from 2019 showed a normal ejection fraction.. I went over side effects of HBO with the patient and answered any of her questions. Her husband was present Objective Constitutional Sitting or standing Blood Pressure is within target range for patient.. Pulse regular and within target range for patient.Marland Kitchen Respirations regular, non-labored and within target range.. Temperature is normal and within the target range for the patient.Marland Kitchen Appears in no distress. Vitals Time Taken: 8:00 AM, Height: 67 in, Weight: 257  lbs, BMI: 40.2, Temperature: 98.5 F, Pulse: 99 bpm, Respiratory Rate: 16 breaths/min, Blood Pressure: 123/82 mmHg. General Notes: Wound exam; most of the sutures have been removed. There is still necrotic debris on the surface but much less than last time. She has undermining at roughly 10:00 2:00  and a deeper area at 4:00 in the lower part of the wound. The wound was vigorously washed with Anasept and gauze. There is still some nonviable tissue here. Skin changes surrounding the wound of radiation injury. Integumentary (Hair, Skin) Wound #1 status is Open. Original cause of wound was Surgical Injury. The date acquired was: 01/16/2020. The wound has been in treatment 1 weeks. The wound is located on the Left Chest. The wound measures 6.1cm length x 3.4cm width x 1.1cm depth; 16.289cm^2 area and 17.918cm^3 volume. There is Fat Layer (Subcutaneous Tissue) exposed. Tunneling has been noted at 3:00 with a maximum distance of 3.8cm. There is additional tunneling and at 2:00 with a maximum distance of 1.4cm. Undermining begins at 9:00 and ends at 12:00 with a maximum distance of 0.9cm. There is a medium amount of serosanguineous drainage noted. The wound margin is well defined and not attached to the wound base. There is small (1-33%) red, pink granulation within the wound bed. There is a large (67-100%) amount of necrotic tissue within the wound bed including Adherent Slough. General Notes: x4 sutures noted in wound bed. Assessment Active Problems ICD-10 Intraductal carcinoma in situ of left breast Disruption of external operation (surgical) wound, not elsewhere classified, subsequent encounter 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 Plan Follow-up Appointments: Return appointment in 3 weeks. - 05/07/2020 Nurse Visit: - 04/23/2020 04/30/2020 Bathing/ Shower/ Hygiene: May shower with protection but do not get  wound dressing(s) wet. Hyperbaric Oxygen Therapy: Evaluate for HBO Therapy Indication: - soft tissue radionecrosis 2.5 ATA for 90 Minutes with 2 Five (5) Minute Air Breaks T Number of Treatments: - 40 otal One treatments per day (delivered Monday through Friday unless otherwise specified in Special Instructions below): Afrin (Oxymetazoline HCL) 0.05% nasal spray - 1 spray in both nostrils daily as needed prior to HBO treatment for difficulty clearing ears The following medication(s) was prescribed: Augmentin oral 875 mg-125 mg tablet 1 tablet oral bid for a further 7 days (continuing rx) starting 04/16/2020 WOUND #1: - Chest Wound Laterality: Left Cleanser: Normal Saline (Generic) Every Other Day/15 Days Discharge Instructions: Cleanse the wound with Normal Saline prior to applying a clean dressing using gauze sponges, not tissue or cotton balls. Prim Dressing: KerraCel Ag Gelling Fiber Dressing, 4x5 in (silver alginate) (Generic) Every Other Day/15 Days ary Discharge Instructions: Ensure to pack into tunnels and undermining. Apply silver alginate to wound bed as instructed Secondary Dressing: Zetuvit Plus 4x4 in (Generic) Every Other Day/15 Days Discharge Instructions: Apply over primary dressing as directed. 1. The patient is starting HBO. I do not believe I have ever seen anything related to Medicaid approve HBO so quickly 2. I have extended her Augmentin for a total of 2 weeks 3. Continue silver alginate for now 4. She sees [name] tomorrow she is probably going to require some minimal debridement. 5. Ultimately I wonder whether she might be a candidate for a snapvac or possibly a skin substitute. These are things that usually Medicaid does not approve however we may need to explore this. Electronic Signature(s) Signed: 04/16/2020 4:33:39 PM By: [name] MD Entered By: [name] on 04/16/2020  09:20:36 -------------------------------------------------------------------------------- SuperBill Details Patient Name: Date of Service: [name] 04/16/2020 Medical Record Number: [id number] Patient Account Number: [id number] Date of Birth/Sex: Treating RN: 03/23/1970 (50 y.o. 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

## 2020-04-17 ENCOUNTER — Other Ambulatory Visit (HOSPITAL_COMMUNITY): Payer: Self-pay

## 2020-04-17 ENCOUNTER — Encounter (HOSPITAL_BASED_OUTPATIENT_CLINIC_OR_DEPARTMENT_OTHER): Payer: Medicaid Other | Admitting: Physician Assistant

## 2020-04-17 ENCOUNTER — Other Ambulatory Visit: Payer: Self-pay

## 2020-04-17 DIAGNOSIS — N641 Fat necrosis of breast: Secondary | ICD-10-CM | POA: Diagnosis not present

## 2020-04-17 LAB — GLUCOSE, CAPILLARY: Glucose-Capillary: 141 mg/dL — ABNORMAL HIGH (ref 70–99)

## 2020-04-17 MED ORDER — LORAZEPAM 1 MG PO TABS
1.0000 mg | ORAL_TABLET | Freq: Every day | ORAL | 0 refills | Status: DC | PRN
  Filled 2020-04-17: qty 15, 15d supply, fill #0

## 2020-04-17 MED ORDER — GABAPENTIN 300 MG PO CAPS
ORAL_CAPSULE | Freq: Every day | ORAL | 4 refills | Status: DC
  Filled 2020-04-17 (×2): qty 90, 45d supply, fill #0
  Filled 2020-06-14: qty 90, 45d supply, fill #1
  Filled 2020-07-10: qty 90, 45d supply, fill #2
  Filled 2020-08-05: qty 90, 45d supply, fill #3

## 2020-04-17 NOTE — Progress Notes (Signed)
[name], WHITELAW ([id number]) Visit Report for 04/17/2020 HBO Details Patient Name: Date of Service: [name] 04/17/2020 8:00 A M Medical Record Number: [id number] Patient Account Number: [id number] Date of Birth/Sex: Treating RN: 05-17-1970 (50 y.o. 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 HBO Treatment Details Treatment Number: 2 Patient Type: Outpatient Chamber Type: Monoplace Chamber Serial #: [id number] Treatment Protocol: 2.5 ATA with 90 minutes oxygen, with two 5 minute air breaks Treatment Details Compression Rate Down: 2.0 psi / minute De-Compression Rate Up: 2.5 psi / minute A breaks and ir Compress Tx Pressure breathing periods Decompress Decompress Begins Reached (leave unused Begins Ends spaces blank) Chamber Pressure (ATA 1 2.5 2.5 2.5 2.5 2.5 - - 2.5 1 ) Clock Time (24 hr) 08:12 08:25 08:55 - - - - - 08:57 09:05 Treatment Length: 53 (minutes) Treatment Segments: 2 Vital Signs Capillary Blood Glucose Reference Range: 80 - 120 mg / dl HBO Diabetic Blood Glucose Intervention Range: <131 mg/dl or >249 mg/dl Time Vitals Blood Respiratory Capillary Blood Glucose Pulse Action Type: Pulse: Temperature: Taken: Pressure: Rate: Glucose (mg/dl): Meter #: Oximetry (%) Taken: Pre 08:05 131/83 87 18 98.1 Post 09:08 125/83 86 22 98.1 141 Treatment Response Treatment Toleration: Fair Treatment Completion Status: Treatment Aborted/Not Restarted Reason: Patient Choice Treatment Notes First air break began at 0855. At 0857 patient c/o of pressure on her chest, hot, and sweating. Explained to patient to continue to  breathe the air through the mask. Started traveling up at rate 2.5. PA [facility] notified. Denies dizziness, nausea, blurry vision, or ear pain. Explained to patient to close eyes, take slow deep breaths, listen to the sound of my voice. Explained traveling now takes several minutes to remove oxygen from tank. Patient in agreement. PA Jeri Cos spoke with patient discussing symptoms and traveling as well. Patient reached surface at 0905. Removed patient from chamber vital signs and blood glucose taken all within normal limits. Per patient feeling better now. Patient states, "Just felt like more pressure on chest today than yesterday. Feels tighter on my chest than like my chest binder when in the chamber." Per Jeri Cos to decrease patient depth to 2 ATA with 2 air breaks 90 minutes. Patient in agreement to try again tomorrow at the new pressure setting. PA in agreement for patient to be discharged to home for today. Electronic Signature(s) Signed: 04/17/2020 12:26:47 PM By: Deon Pilling Signed: 04/17/2020 5:03:59 PM By: Worthy Keeler PA-C Entered By: Deon Pilling on 04/17/2020 10:04:22 -------------------------------------------------------------------------------- HBO Safety Checklist Details Patient Name: Date of Service: [name] Crochet V [name] 04/17/2020 8:00 A M Medical Record Number: [id number] Patient Account Number: [id number] Date of Birth/Sex: Treating RN: 1970/05/28 (51 y.o. [name] Primary Care Mckade Gurka: [name] Other Clinician: Referring Byran Bilotti: Treating Barbee Mamula/Extender: Randon Goldsmith in Treatment: 1 HBO Safety Checklist Items Safety Checklist Consent Form Signed Patient voided / foley secured and emptied When did you last eato this morning cheese crackers Last dose of injectable or oral agent n/a Ostomy pouch emptied and vented if applicable NA All implantable devices assessed, documented and approved Intravenous access site  secured and place NA Valuables secured Linens and cotton and  cotton/polyester blend (less than 51% polyester) Personal oil-based products / skin lotions / body lotions removed Wigs or hairpieces removed NA Smoking or tobacco materials removed NA Books / newspapers / magazines / loose paper removed NA Cologne, aftershave, perfume and deodorant removed NA Jewelry removed (may wrap wedding band) Make-up removed NA Hair care products removed NA Battery operated devices (external) removed NA Heating patches and chemical warmers removed NA Titanium eyewear removed Nail polish cured greater than 10 hours Casting material cured greater than 10 hours NA Hearing aids removed NA Loose dentures or partials removed NA Prosthetics have been removed NA Patient demonstrates correct use of air break device (if applicable) Patient concerns have been addressed Patient grounding bracelet on and cord attached to chamber Specifics for Inpatients (complete in addition to above) Medication sheet sent with patient Intravenous medications needed or due during therapy sent with patient Drainage tubes (e.g. nasogastric tube or chest tube secured and vented) Endotracheal or Tracheotomy tube secured Cuff deflated of air and inflated with saline Airway suctioned Electronic Signature(s) Signed: 04/17/2020 12:26:47 PM By: Deon Pilling Entered By: Deon Pilling on 04/17/2020 09:50:39

## 2020-04-17 NOTE — Progress Notes (Signed)
MAYELI, BORNHORST ([id number]) Visit Report for 04/17/2020 Physician Orders Details Patient Name: Date of Service: [name] [name] 04/17/2020 8:00 Sarah M Medical Record Number: [id number] Patient Account Number: [id number] Date of Birth/Sex: Treating RN: Mar 01, 1970 (50 y.o. 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 Hyperbaric Oxygen Therapy Indication: - soft tissue radionecrosis If appropriate for treatment, begin HBOT per protocol: 2.0 ATA for 90 Minutes with 2 Five (5) Minute Sarah Breaks ir One treatments per day (delivered Monday through Friday unless otherwise specified in Special Instructions below): Sarah frin (Oxymetazoline HCL) 0.05% nasal spray - 1 spray in both nostrils daily as needed prior to HBO treatment for difficulty clearing ears Wound Treatment Electronic Signature(s) Signed: 04/17/2020 12:26:47 PM By: Deon Pilling Signed: 04/17/2020 5:03:59 PM By: Worthy Keeler PA-C Entered By: Deon Pilling on 04/17/2020 10:06:39 -------------------------------------------------------------------------------- Problem List Details Patient Name: Date of Service: Sarah Crochet V [name] 04/17/2020 8:00 Sarah M Medical Record Number: [id number] Patient Account Number: [id number] Date of Birth/Sex: Treating RN: March 15, 1970 (50 y.o. 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 04/17/2020 Medical Record Number: [id number] Patient Account Number: [id number] Date of Birth/Sex: Treating RN: 12/06/1970 (50 y.o. [name], [name] Primary Care Provider: [name] Other Clinician: Referring Provider: Treating Provider/Extender: Randon Goldsmith in Treatment: 1 Diagnosis Coding ICD-10 Codes Code Description [phone] Intraductal carcinoma in situ of left breast T81.31XD Disruption of external operation (surgical) wound, not elsewhere classified, subsequent encounter 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 Facility Procedures CPT4 Code: 79024097 Description: G0277-(Facility Use Only) HBOT full body chamber, 33min , Modifier: Quantity: 2 CPT4 Code: 35329924 Description: 99183-Physician attendance and supervision of hyperbaric oxygen therapy, per session ICD-10 Diagnosis Description 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

## 2020-04-17 NOTE — Progress Notes (Signed)
KAILY, WRAGG ([id number]) Visit Report for 04/16/2020 SuperBill Details Patient Name: Date of Service: [name] 04/16/2020 Medical Record Number: [id number] Patient Account Number: [id number] Date of Birth/Sex: Treating RN: 1970/01/22 (50 y.o. 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

## 2020-04-17 NOTE — Progress Notes (Signed)
[name], [name] ([id number]) Visit Report for 04/16/2020 Arrival Information Details Patient Name: Date of Service: [name] [name] 04/16/2020 8:15 Sarah M Medical Record Number: [id number] Patient Account Number: [id number] Date of Birth/Sex: Treating RN: 1970-11-19 (50 y.o. [name], [name] Primary Care [name]: [name] Other Clinician: Referring Ruthanne Mcneish: Treating [name]/Extender: [name] in Treatment: 1 Visit Information History Since Last Visit Added or deleted any medications: No Patient Arrived: Ambulatory Any new allergies or adverse reactions: No Arrival Time: 08:45 Had Sarah fall or experienced change in No Accompanied By: husband activities of daily living that may affect Transfer Assistance: None risk of falls: Patient Identification Verified: Yes Signs or symptoms of abuse/neglect since last visito No Secondary Verification Process Completed: Yes Hospitalized since last visit: No Patient Requires Transmission-Based Precautions: No Implantable device outside of the clinic excluding No Patient Has Alerts: No cellular tissue based products placed in the center since last visit: Has Dressing in Place as Prescribed: Yes Pain Present Now: No Electronic Signature(s) Signed: 04/17/2020 5:13:29 PM By: Levan Hurst RN, BSN Entered By: Levan Hurst on 04/16/2020 09:41:49 -------------------------------------------------------------------------------- Encounter Discharge Information Details Patient Name: Date of Service: [name] Sarah [name] 04/16/2020 8:15 Sarah M Medical Record Number: [id number] Patient Account Number: [id number] Date of Birth/Sex: Treating RN: [birth year] (50 y.o. [name] Primary Care Shaydon Lease: [name] Other Clinician: Referring Jeneen Doutt: Treating Mendell Bontempo/Extender: [name] in Treatment: 1 Encounter Discharge Information Items Discharge Condition:  Stable Ambulatory Status: Ambulatory Discharge Destination: Home Transportation: Private Auto Accompanied By: alone Schedule Follow-up Appointment: Yes Clinical Summary of Care: Patient Declined Electronic Signature(s) Signed: 04/17/2020 5:13:29 PM By: Levan Hurst RN, BSN Entered By: Levan Hurst on 04/16/2020 11:38:02 -------------------------------------------------------------------------------- Patient/Caregiver Education Details Patient Name: Date of Service: [name] [name] 4/12/2022andnbsp8:15 Sarah M Medical Record Number: [id number] Patient Account Number: [id number] Date of Birth/Gender: Treating RN: 06-29-1970 (50 y.o. [name] Primary Care Physician: [name] Other Clinician: Referring Physician: Treating Physician/Extender: [name] in Treatment: 1 Education Assessment Education Provided To: Patient Education Topics Provided Hyperbaric Oxygenation: Methods: Explain/Verbal Responses: State content correctly Electronic Signature(s) Signed: 04/17/2020 5:13:29 PM By: Levan Hurst RN, BSN Entered By: Levan Hurst on 04/16/2020 11:37:50 -------------------------------------------------------------------------------- Vitals Details Patient Name: Date of Service: [name] Sarah [name] 04/16/2020 8:15 Sarah M Medical Record Number: [id number] Patient Account Number: [id number] Date of Birth/Sex: Treating RN: [birth year] (50 y.o. [name] Primary Care Shaunessy Dobratz: [name] Other Clinician: Referring Sumaya Riedesel: Treating Ameia Morency/Extender: [name] in Treatment: 1 Vital Signs Time Taken: 08:45 Temperature (F): 98.5 Height (in): 67 Pulse (bpm): 99 Weight (lbs): 257 Respiratory Rate (breaths/min): 16 Body Mass Index (BMI): 40.2 Blood Pressure (mmHg): 123/82 Reference Range: 80 - 120 mg / dl Electronic Signature(s) Signed: 04/17/2020 5:13:29 PM By: Levan Hurst  RN, BSN Entered By: Levan Hurst on 04/16/2020 09:42:10

## 2020-04-17 NOTE — Progress Notes (Signed)
[name], [name] ([id number]) Visit Report for 04/16/2020 HBO Details Patient Name: Date of Service: Sarah [name] [name] 04/16/2020 8:15 Sarah M Medical Record Number: [id number] Patient Account Number: [id number] Date of Birth/Sex: Treating RN: 07/21/1970 (50 y.o. 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 HBO Treatment Details Treatment Number: 1 Patient Type: Outpatient Chamber Type: Monoplace Chamber Serial #: [id number] Treatment Protocol: 2.5 ATA with 90 minutes oxygen, with two 5 minute air breaks Treatment Details Compression Rate Down: 1.5 psi / minute De-Compression Rate Up: 2.0 psi / minute Sarah breaks and breathing ir Compress Tx Pressure periods Decompress Decompress Begins Reached (leave unused spaces Begins Ends blank) Chamber Pressure (ATA 1 2.5 2.5 2.5 2.5 2.5 - - 2.5 1 ) Clock Time (24 hr) 08:55 09:10 09:40 09:45 10:15 10:20 - - 10:50 11:02 Treatment Length: 127 (minutes) Treatment Segments: 4 Vital Signs Capillary Blood Glucose Reference Range: 80 - 120 mg / dl HBO Diabetic Blood Glucose Intervention Range: <131 mg/dl or >249 mg/dl Time Vitals Blood Respiratory Capillary Blood Glucose Pulse Action Type: Pulse: Temperature: Taken: Pressure: Rate: Glucose (mg/dl): Meter #: Oximetry (%) Taken: Pre 08:45 123/82 99 16 98.5 Post 11:02 104/70 73 18 97.9 Treatment Response Treatment Completion Status: Treatment Completed without Adverse Event [name] Notes No concerns with treatment given. This was the patient's initial dive. Cardio and respiratory exams were normal tympanic membranes look normal. She tolerated her  first treatment well. She was also seen for wound care evaluation in clinic. Physician HBO Attestation: I certify that I supervised this HBO treatment in accordance with Medicare guidelines. Sarah trained emergency response team is readily available per Yes hospital policies and procedures. Continue HBOT as ordered. Yes Electronic Signature(s) Signed: 04/16/2020 4:33:39 PM By: [name] MD Entered By: [name] on 04/16/2020 16:14:43 -------------------------------------------------------------------------------- HBO Safety Checklist Details Patient Name: Date of Service: Sarah Crochet V [name] 04/16/2020 8:15 Sarah M Medical Record Number: [id number] Patient Account Number: [id number] Date of Birth/Sex: Treating RN: 1970-08-25 (50 y.o. [name] Primary Care Artur Winningham: [name] Other Clinician: Referring Cyndel Griffey: Treating Bookert Guzzi/Extender: [name] in Treatment: 1 HBO Safety Checklist Items Safety Checklist Consent Form Signed Patient voided / foley secured and emptied When did you last eato 0630 Last dose of injectable or oral agent na Ostomy pouch emptied and vented if applicable NA All implantable devices assessed, documented and approved Intravenous access site secured and place NA Valuables secured Linens and cotton and cotton/polyester blend (less than 51% polyester) Personal oil-based products / skin lotions / body lotions removed Wigs or hairpieces removed Smoking or tobacco materials removed Books / newspapers / magazines / loose paper removed Cologne, aftershave, perfume and deodorant removed Jewelry removed (may wrap wedding band) Make-up removed Hair care products removed Battery operated devices (external) removed Heating patches and chemical warmers removed Titanium eyewear removed Nail polish cured greater than 10 hours Casting material cured greater than 10 hours NA Hearing aids removed NA Loose  dentures or partials removed Prosthetics have been removed NA Patient demonstrates correct use of air break device (if applicable) Patient concerns have been addressed Patient grounding bracelet on and cord attached to chamber Specifics for Inpatients (complete in addition to  above) Medication sheet sent with patient NA Intravenous medications needed or due during therapy sent with patient NA Drainage tubes (e.g. nasogastric tube or chest tube secured and vented) NA Endotracheal or Tracheotomy tube secured NA Cuff deflated of air and inflated with saline NA Airway suctioned NA Electronic Signature(s) Signed: 04/17/2020 5:13:29 PM By: Levan Hurst RN, BSN Entered By: Levan Hurst on 04/16/2020 09:42:59

## 2020-04-17 NOTE — Progress Notes (Signed)
BIRTIE, FELLMAN ([id number]) Visit Report for 04/17/2020 Arrival Information Details Patient Name: Date of Service: [name] 04/17/2020 8:00 A M Medical Record Number: [id number] Patient Account Number: [id number] Date of Birth/Sex: Treating RN: Oct 18, 1970 (50 y.o. [name], [name] Primary Care Psalm Schappell: [name] Other Clinician: Referring Brae Schaafsma: Treating Jerrie Schussler/Extender: Randon Goldsmith in Treatment: 1 Visit Information History Since Last Visit Added or deleted any medications: No Patient Arrived: Ambulatory Any new allergies or adverse reactions: No Arrival Time: 08:05 Had a fall or experienced change in No Accompanied By: self activities of daily living that may affect Transfer Assistance: None risk of falls: Patient Identification Verified: Yes Signs or symptoms of abuse/neglect since last visito No Secondary Verification Process Completed: Yes Hospitalized since last visit: No Patient Requires Transmission-Based Precautions: No Implantable device outside of the clinic excluding No Patient Has Alerts: No cellular tissue based products placed in the center since last visit: Has Dressing in Place as Prescribed: Yes Pain Present Now: No Electronic Signature(s) Signed: 04/17/2020 12:26:47 PM By: Deon Pilling Entered By: Deon Pilling on 04/17/2020 09:49:43 -------------------------------------------------------------------------------- Encounter Discharge Information Details Patient Name: Date of Service: [name] 04/17/2020 8:00 A M Medical Record Number: [id number] Patient Account Number: [id number] Date of Birth/Sex: Treating RN: 08-06-1970 (50 y.o. [name] Primary Care Berna Gitto: [name] Other Clinician: Referring Muhammadali Ries: Treating Bryten Maher/Extender: Randon Goldsmith in Treatment: 1 Encounter Discharge Information Items Discharge Condition: Stable Ambulatory  Status: Ambulatory Discharge Destination: Home Transportation: Private Auto Accompanied By: self Schedule Follow-up Appointment: Yes Clinical Summary of Care: Electronic Signature(s) Signed: 04/17/2020 12:26:47 PM By: Deon Pilling Entered By: Deon Pilling on 04/17/2020 10:05:19 -------------------------------------------------------------------------------- Patient/Caregiver Education Details Patient Name: Date of Service: [name] 4/13/2022andnbsp8:00 A M Medical Record Number: [id number] Patient Account Number: [id number] Date of Birth/Gender: Treating RN: [birth year] (50 y.o. [name] Primary Care Physician: [name] Other Clinician: Referring Physician: Treating Physician/Extender: Randon Goldsmith in Treatment: 1 Education Assessment Education Provided To: Patient Education Topics Provided Hyperbaric Oxygenation: Handouts: Hyperbaric Oxygen Methods: Explain/Verbal Responses: Reinforcements needed Electronic Signature(s) Signed: 04/17/2020 12:26:47 PM By: Deon Pilling Entered By: Deon Pilling on 04/17/2020 10:05:12 -------------------------------------------------------------------------------- Vitals Details Patient Name: Date of Service: [name], [name] 04/17/2020 8:00 A M Medical Record Number: [id number] Patient Account Number: [id number] Date of Birth/Sex: Treating RN: November 08, 1970 (50 y.o. [name], [name] Primary Care Linley Moskal: [name] Other Clinician: Referring Sydelle Sherfield: Treating Jaisa Defino/Extender: Randon Goldsmith in Treatment: 1 Vital Signs Time Taken: 08:05 Temperature (F): 98.1 Height (in): 67 Pulse (bpm): 87 Weight (lbs): 257 Respiratory Rate (breaths/min): 18 Body Mass Index (BMI): 40.2 Blood Pressure (mmHg): 131/83 Reference Range: 80 - 120 mg / dl Electronic Signature(s) Signed: 04/17/2020 12:26:47 PM By: Deon Pilling Entered By: Deon Pilling on  04/17/2020 09:49:58

## 2020-04-17 NOTE — Progress Notes (Signed)
[place]  [phone] [phone]     ID: [name] DOB: May 17, 1970  MR#: [id number]  BCW#:[id number]  Patient Care Team: [name], MD as PCP - General (Internal Medicine) [name] Biehn, [name], MD as Consulting Physician (Oncology) [name], MD as Attending Physician (Radiation Oncology) Delice Bison Charlestine Massed, NP as Nurse Practitioner (Hematology and Oncology) Rolm Bookbinder, MD as Consulting Physician (General Surgery) Claudia Desanctis Steffanie Dunn, MD as Consulting Physician (Plastic Surgery) Mauro Kaufmann, RN as Oncology Nurse Navigator Rockwell Germany, RN as Oncology Nurse Navigator Milus Banister, MD as Attending Physician (Gastroenterology) Ricard Dillon, MD as Consulting Physician (Internal Medicine) OTHER MD:   CHIEF COMPLAINT: Triple negative breast cancer; BRCA2 positive  CURRENT TREATMENT: adjuvant chemotherapy (CMF)   INTERVAL HISTORY: Coreena returns today for a follow-up and treatment of her triple negative breast cancer accompanied by her husband [name].   She had been receiving cyclophosphamide, methotrexate, fluorouracil (CMF) given every 21 days x 8. Cycle 2 was held due to surgical wound dehiscence.  She underwent debridement and closure of the wound on 03/28/2020 under [name].  A second debridement and happened earlier this week  She has been referred for hyperbaric oxygen therapy under Dr. Dellia Nims.  Eatmon started 04/16/2020.  Her bilateral salpingo-oophorectomy and hysterectomy has been postponed until after the current issues have settled down.   REVIEW OF SYSTEMS: [name] is very hopeful that hyperbaric is going to work for her.  She was concerned about starting anastrozole but I reassured her that this has no effect on wound healing 1 where another or clotting so she may safely started.  Olaparib is more complex and I would not start that at this point.  I encouraged her to be as active as  possible within the limits imposed by her treatments.  Detailed review of systems today was otherwise stable.   COVID 19 VACCINATION STATUS: not vaccinated (as of 02/12/2020)   BREAST CANCER HISTORY: From the original intake note:  The patient was seen in the emergency room 05/16/2016 with nonspecific complaints but also noting that she had a tender lump in her lateral left breast which she said she had noted the day before. Exam by the emergency room physician confirmed a 3 cm soft left lateral breast mass without overlying erythema or nipple changes. This was felt to be most consistent with fibrocystic change but the patient was referred back to her primary care physician for further evaluation. She saw the physician assistant in May 18 and then Dr. Wynetta Emery on 06/12/2016 who scheduled a bilateral diagnostic mammography with tomography and left breast ultrasonography at the [facility] 06/18/2016. This found the breast density to be category B. In the left breast upper outer quadrant there was an area of asymmetry measuring up to 10.3 cm. On exam this was firm and palpable and on ultrasound there was an irregular mass measuring 2.5 cm with some subtle changes in the surrounding tissue. In the left lower axilla there were 2 suspicious-looking lymph nodes.  Biopsy of the left breast upper outer quadrant and one of the suspicious lymph nodes 06/19/2016 showed ([id number]) both to be involved by invasive ductal carcinoma, grade 3, estrogen and progesterone receptor negative, HER-2 not amplified with a signals ratio being 1.53-1.74 and the number per cell 2.35-3.05. The MIB-1 was 90%.  The patient's subsequent history is as detailed below.   PAST MEDICAL HISTORY: Past Medical History:  Diagnosis Date  . Arthritis   .  Breast cancer ([facility])    Left Breast Cancer  . Breast cancer ([facility]) 11/24/2019   left   . Complication of anesthesia    DAY SURGERY 2010 OR 2011 ASPIRATED AND STAYED OVERNIGHT  .  History of radiation therapy 03/11/17- 04/21/17   Left breast, 2 Gy in 25 fractions for a total dose of 50 Gy, Boost, 2 Gy in 5 fractions for a total dose of 10 Gy  . Hypertension    OFF MEDS  SINCE CHEMO X 5 MONTHS  . Malignant neoplasm of upper-outer quadrant of left breast in female, estrogen receptor negative ([facility]) 06/23/2016  . Personal history of chemotherapy 2019   Left Breast Cancer  07-2016-12-2016  . Personal history of radiation therapy 2019   Left Breast Cancer    PAST SURGICAL HISTORY: Past Surgical History:  Procedure Laterality Date  . ANKLE SURGERY    . BREAST LUMPECTOMY Left 01/27/2017  . BREAST LUMPECTOMY WITH RADIOACTIVE SEED AND SENTINEL LYMPH NODE BIOPSY Left 01/27/2017   Procedure: LEFT BREAST LUMPECTOMY WITH RADIOACTIVE SEED LOCALIZATION, LEFT AXILLARY DEEP LYMPH NODE BIOPSY WITH RADIOACTIVE SEED LOCALIZATION, LEFT AXILLARY SENTINEL LYMPH NODE MAPPING AND BIOPSY WITH BLUE DYE INJECTION;  Surgeon: [name], MD;  Location: [facility];  Service: General;  Laterality: Left;  . COLONOSCOPY WITH PROPOFOL N/A 02/01/2020   Procedure: COLONOSCOPY WITH PROPOFOL;  Surgeon: Milus Banister, MD;  Location: [facility];  Service: Endoscopy;  Laterality: N/A;  . DEBRIDEMENT AND CLOSURE WOUND Left 03/28/2020   Procedure: DEBRIDEMENT AND CLOSURE OF MASTECTOMY WOUND;  Surgeon: Rolm Bookbinder, MD;  Location: [facility];  Service: General;  Laterality: Left;  . ENDOSCOPIC MUCOSAL RESECTION  02/01/2020   Procedure: ENDOSCOPIC MUCOSAL RESECTION;  Surgeon: Milus Banister, MD;  [facility];  Service: Endoscopy;;  . HEMOSTASIS CLIP PLACEMENT  02/01/2020   Procedure: HEMOSTASIS CLIP PLACEMENT;  Surgeon: Milus Banister, MD;  [facility];  Service: Endoscopy;;  . IR FLUORO GUIDE PORT INSERTION RIGHT  07/14/2016  . IR US GUIDE VASC ACCESS RIGHT  07/14/2016  . MASTECTOMY W/ SENTINEL NODE BIOPSY Bilateral 01/09/2020   Procedure: BILATERAL MASTECTOMY WITH LEFT AXILLARY SENTINEL  LYMPH NODE BIOPSY;  Surgeon: Rolm Bookbinder, MD;  Location: [facility];  Service: General;  Laterality: Bilateral;  . PORT-A-CATH REMOVAL Right 01/27/2017   Procedure: REMOVAL PORT-A-CATH;  Surgeon: [name], MD;  Location: [facility];  Service: General;  Laterality: Right;  . PORTACATH PLACEMENT Right 01/09/2020   Procedure: INSERTION PORT-A-CATH;  Surgeon: Rolm Bookbinder, MD;  Location: [facility];  Service: General;  Laterality: Right;  . SUBMUCOSAL LIFTING INJECTION  02/01/2020   Procedure: SUBMUCOSAL LIFTING INJECTION;  Surgeon: Milus Banister, MD;  [facility];  Service: Endoscopy;;  . TUBAL LIGATION    . WISDOM TOOTH EXTRACTION      FAMILY HISTORY Family History  Problem Relation Age of Onset  . Diabetes Mother   . Hypertension Mother   . Arthritis Mother   . Colon cancer Father 56       died .76 metastatic at time of diagnosis  . Diabetes Father   . Heart disease Father   . Hypertension Father   . Breast cancer Maternal Grandmother 14       d.60s  . Breast cancer Maternal Aunt 52  . Cervical cancer Maternal Aunt 24  . Breast cancer Maternal Aunt 46       d.50s  . Cancer Maternal Uncle  d.62s unspecified type of cancer  . Breast cancer Cousin 52       paternal first-cousin (daughter of unaffected aunt)  . Cancer Maternal Aunt 75       "Female Cancer"  . Cancer Maternal Aunt        unknown cancer  . Brain cancer Daughter 12  . Esophageal cancer Other   . Colon polyps Neg Hx   . Rectal cancer Neg Hx   . Stomach cancer Neg Hx   The patient's father died at age 47 from colon cancer which she never had treated. The patient's mother is living, 98 years old as of June 2018. The patient has one brother, one sister. On the mother's side there is a history of breast cancer in the grandmother, age 41, and then 1 aunt with breast cancer age 65, and another with lung cancer age 51. There is no history of ovarian cancer in the  family.   GYNECOLOGIC HISTORY:  No LMP recorded (lmp unknown). Patient is postmenopausal. Menarche age 14, first live birth age 79. She is GX P4. Her periods have never been regular. She had a period in June 2018 but not for 4 months before that. She took birth control for some years remotely with no complications. She is status post bilateral tubal ligation.   SOCIAL HISTORY:  The patient's husband owns and runs a food truck.. They serve mostly [place] and [place] food. 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 Her husband Maurissa Ambrose (goes by "[name]") is originally from [name]. The patient has two children from her first marriage. Her son [name], who is 78 years old as of Oct 2021, was previously in the [employer], but now is taking courses at [employer] and  her 45 y/o daughter [name], who was studying psychology at [name] before her optic pathway germinoma, which has left her 97% blind.  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 scholarship, doing premed) and 109 as of October 2020    ADVANCED DIRECTIVES: In the absence of any documents to the contrary the patient's husband is her healthcare power of attorney   HEALTH MAINTENANCE: Social History   Tobacco Use  . Smoking status: Never Smoker  . Smokeless tobacco: Never Used  Vaping Use  . Vaping Use: Never used  Substance Use Topics  . Alcohol use: No  . Drug use: No     Colonoscopy: 02/01/2020  [facility] 2018  Bone density:Never   Allergies  Allergen Reactions  . Carboplatin Other (See Comments)    Red, blotchy, hot    Current Outpatient Medications  Medication Sig Dispense Refill  . amoxicillin-clavulanate (AUGMENTIN) 875-125 MG tablet TAKE 1 TABLET BY MOUTH TWICE A DAY 14 tablet 0  . amoxicillin-clavulanate (AUGMENTIN)  875-125 MG tablet Take 1 tablet by mouth twice a day for a further 7 days 14 tablet 0  . anastrozole (ARIMIDEX) 1 MG tablet TAKE 1 TABLET BY MOUTH DAILY. 90 tablet 4  . gabapentin (NEURONTIN) 100 MG capsule TAKE 2 CAPSULES BY MOUTH IN THE MORNING AND 2 CAPSULES IN THE AFTERNOON 120 capsule 3  . gabapentin (NEURONTIN) 300 MG capsule TAKE 2 CAPSULES BY MOUTH EVERY NIGHT AT BEDTIME 90 capsule 4  . HYDROcodone-acetaminophen (NORCO/VICODIN) 5-325 MG tablet TAKE 1/2-1 TABLET BY MOUTH EVERY 4 HOURS AS NEEDED FOR PAIN 40  tablet 0  . ibuprofen (ADVIL) 800 MG tablet TAKE 1 TABLET BY MOUTH 3 TIMES DAILY AS NEEDED 40 tablet 1  . lidocaine-prilocaine (EMLA) cream APPLY TO AFFECTED AREA ONCE AS DIRECTED (Patient taking differently: Apply 1 application topically See admin instructions. Apply to affected area prior to port access.) 30 g 3  . LORazepam (ATIVAN) 1 MG tablet Take 1 tablet (1 mg total) by mouth daily as needed. 15 tablet 0  . methocarbamol (ROBAXIN) 500 MG tablet TAKE 1 TABLET BY MOUTH 3 TIMES DAILY AS NEEDED 60 tablet 0  . methocarbamol (ROBAXIN) 500 MG tablet TAKE 1 TABLET BY MOUTH EVERY 6 HOURS AS NEEDED FOR MUSCLE SPASMS (Patient taking differently: Take 500 mg by mouth 3 (three) times daily as needed for muscle spasms.) 30 tablet 1  . metroNIDAZOLE (FLAGYL) 500 MG tablet TAKE 1 TABLET BY MOUTH 2 TIMES DAILY FOR 5 DAYS 10 tablet 0  . oxyCODONE (OXY IR/ROXICODONE) 5 MG immediate release tablet TAKE 1 TABLET BY MOUTH EVERY 6 HOURS AS NEEDED FOR MODERATE, SEVERE OR BREAKTHROUGH PAIN 10 tablet 0  . potassium chloride SA (KLOR-CON) 20 MEQ tablet TAKE 1 TABLET BY MOUTH ONCE A DAY 30 tablet 2  . prochlorperazine (COMPAZINE) 10 MG tablet TAKE 1 TABLET BY MOUTH EVERY 6 HOURS AS NEEDED FOR NAUSEA OR VOMITING 30 tablet 1  . senna-docusate (SENOKOT-S) 8.6-50 MG tablet Take 2 tablets by mouth at bedtime.    . traMADol (ULTRAM) 50 MG tablet TAKE 1-2 TABLETS BY MOUTH EVERY 6 HOURS AS NEEDED. 20 tablet 0   No  current facility-administered medications for this visit.    OBJECTIVE: White woman who appears stated age  50:   04/18/20 1406  BP: 119/77  Pulse: 100  Resp: 17  Temp: 97.7 F (36.5 C)  SpO2: 100%     Body mass index is 40.72 kg/m.   Filed Weights   04/18/20 1406  Weight: 260 lb (117.9 kg)   ECOG FS:1 - Symptomatic but completely ambulatory  Sclerae unicteric, EOMs intact Wearing a mask No cervical or supraclavicular adenopathy Lungs no rales or rhonchi Heart regular rate and rhythm Abd soft, nontender, positive bowel sounds MSK no focal spinal tenderness, no upper extremity lymphedema Neuro: nonfocal, well oriented, appropriate affect Breasts: The left breast is status post mastectomy.  The current wound status is imaged below.  Left breast 04/18/2020:     Left breast March 2022     LAB RESULTS:  CMP     Component Value Date/Time   NA 141 04/01/2020 0855   NA 140 01/07/2017 1342   K 3.3 (L) 04/01/2020 0855   K 3.5 01/07/2017 1342   CL 102 04/01/2020 0855   CO2 29 04/01/2020 0855   CO2 27 01/07/2017 1342   GLUCOSE 96 04/01/2020 0855   GLUCOSE 148 (H) 01/07/2017 1342   BUN 7 04/01/2020 0855   BUN 6.2 (L) 01/07/2017 1342   CREATININE 0.62 04/01/2020 0855   CREATININE 0.79 12/28/2019 1322   CREATININE 0.7 01/07/2017 1342   CALCIUM 8.5 (L) 04/01/2020 0855   CALCIUM 9.4 01/07/2017 1342   PROT 6.9 04/01/2020 0855   PROT 7.0 01/07/2017 1342   ALBUMIN 3.0 (L) 04/01/2020 0855   ALBUMIN 3.7 01/07/2017 1342   AST 11 (L) 04/01/2020 0855   AST 20 12/28/2019 1322   AST 23 01/07/2017 1342   ALT 11 04/01/2020 0855   ALT 19 12/28/2019 1322   ALT 24 01/07/2017 1342   ALKPHOS 113 04/01/2020 0855  ALKPHOS 105 01/07/2017 1342   BILITOT 0.5 04/01/2020 0855   BILITOT 0.9 12/28/2019 1322   BILITOT 0.84 01/07/2017 1342   GFRNONAA >60 04/01/2020 0855   GFRNONAA >60 12/28/2019 1322   GFRAA >60 05/23/2019 1921   GFRAA >60 10/13/2018 1346    No results  found for: [name], A1GS, A2GS, BETS, BETA2SER, GAMS, MSPIKE, SPEI  No results found for: Nils Pyle, [facility]  Lab Results  Component Value Date   WBC 5.2 04/01/2020   NEUTROABS 2.6 04/01/2020   HGB 10.3 (L) 04/01/2020   HCT 32.3 (L) 04/01/2020   MCV 89.0 04/01/2020   PLT 217 04/01/2020      Chemistry      Component Value Date/Time   NA 141 04/01/2020 0855   NA 140 01/07/2017 1342   K 3.3 (L) 04/01/2020 0855   K 3.5 01/07/2017 1342   CL 102 04/01/2020 0855   CO2 29 04/01/2020 0855   CO2 27 01/07/2017 1342   BUN 7 04/01/2020 0855   BUN 6.2 (L) 01/07/2017 1342   CREATININE 0.62 04/01/2020 0855   CREATININE 0.79 12/28/2019 1322   CREATININE 0.7 01/07/2017 1342      Component Value Date/Time   CALCIUM 8.5 (L) 04/01/2020 0855   CALCIUM 9.4 01/07/2017 1342   ALKPHOS 113 04/01/2020 0855   ALKPHOS 105 01/07/2017 1342   AST 11 (L) 04/01/2020 0855   AST 20 12/28/2019 1322   AST 23 01/07/2017 1342   ALT 11 04/01/2020 0855   ALT 19 12/28/2019 1322   ALT 24 01/07/2017 1342   BILITOT 0.5 04/01/2020 0855   BILITOT 0.9 12/28/2019 1322   BILITOT 0.84 01/07/2017 1342       No results found for: LABCA2  No components found for: [id number]  No results for input(s): INR in the last 168 hours.  Urinalysis    Component Value Date/Time   COLORURINE YELLOW 12/05/2019 1154   APPEARANCEUR CLOUDY (A) 12/05/2019 1154   LABSPEC 1.024 12/05/2019 1154   LABSPEC 1.025 11/23/2016 1251   PHURINE 5.0 12/05/2019 1154   GLUCOSEU NEGATIVE 12/05/2019 1154   GLUCOSEU Negative 11/23/2016 1251   HGBUR SMALL (A) 12/05/2019 1154   BILIRUBINUR NEGATIVE 12/05/2019 1154   BILIRUBINUR Negative 11/23/2016 1251   KETONESUR NEGATIVE 12/05/2019 1154   PROTEINUR NEGATIVE 12/05/2019 1154   UROBILINOGEN 0.2 11/23/2016 1251   NITRITE NEGATIVE 12/05/2019 1154   LEUKOCYTESUR NEGATIVE 12/05/2019 1154   LEUKOCYTESUR Negative 11/23/2016 1251    STUDIES: DG Chest 2  View  Result Date: 04/15/2020 CLINICAL DATA:  Clearance for hyperbaric oxygen therapy, nonhealing wound from left mastectomy. EXAM: CHEST - 2 VIEW COMPARISON:  05/23/2019 and CT chest 07/25/2016. FINDINGS: Right IJ Port-A-Cath terminates in the SVC. Trachea is midline. Heart size normal. Lungs are clear. No pleural fluid. IMPRESSION: No acute findings. Electronically Signed   By: Lorin Picket M.D.   On: 04/15/2020 15:16     ELIGIBLE FOR AVAILABLE RESEARCH PROTOCOL: no   ASSESSMENT: 50 y.o. [place] woman status post left breast upper outer quadrant and left axillary lymph node biopsy 06/19/2016, both positive for a clinical T2-T3 N1, stage IIIB-C invasive ductal carcinoma, grade 3, triple negative, with an MIB-1 of 90%  (1) genetics testing 08/04/2016 showed a variant of uncertain significance in the BRCA2 namely c.8169T>A (p.[id number]). 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 Additional testing of the patient's mother and maternal aunt is pending. Otherwise [facility]'s Common Hereditary Cancers Panel found no deleterious mutations  in APC, ATM, AXIN2, BARD1, BMPR1A, BRCA1, BRCA2, BRIP1, CDH1, CDKN2A, CHEK2, CTNNA1, DICER1, EPCAM, GREM1, HOXB13, KIT, MEN1, MLH1, MSH2, MSH3, MSH6, MUTYH, NBN, NF1, NTHL1, PALB2, PDGFRA, PMS2, POLD1, POLE, PTEN, RAD50, RAD51C, RAD51D, SDHA, SDHB, SDHC, SDHD, SMAD4, SMARCA4, STK11, TP53, TSC1, TSC2, and VHL.  (2) neoadjuvant chemotherapy consisting of doxorubicin and cyclophosphamide in dose dense fashion 4 completed 09/10/2016 followed by paclitaxel weekly 12 given with carboplatin   (a) cycle 4 of cyclophosphamide and doxorubicin was delayed 10 days and dose decreased 10% because of febrile neutropenia after cycle 3  (b) cycle 6 of Paclitaxel and Carboplatin delayed due to neutropenia, therefore Granix added to Wednesday, Thursday, Friday following chemotherapy days  (3) status post left lumpectomy and left axillary  sentinel lymph node sampling 01/27/2017 with pathology showing a complete pathologic response (ypT0 ypN0);   (a) 5 left axillary lymph nodes removed  (4) adjuvant radiation 03/11/2017-04/21/2017 Site/dose:   1. Left breast, 2 Gy in 25 fractions for a total dose of 50 Gy                      2. 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  (8) status post bilateral mastectomies 01/09/2020 showing  (a) on the right, no evidence of malignancy  (b) on the left, a pT2 pNX, stage IIB invasive ductal carcinoma, grade 3, with ample margins   (c) repeat prognostic panel on the left again weakly estrogen receptor positive, HER2 negative, progesterone receptor negative  (9) adjuvant therapy with cyclophosphamide, methotrexate, fluorouracil (CMF) given every 21 days x 8, started 02/19/2020  (a) cycle 2 held due to surgical wound dehiscence  (10) olaparib, adjuvant estrogens to follow: Consider starting mid May (11) foundation 1 requested on 01/09/2020 material found in addition to the BRCA2 mutation, a 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  ERV B2 and PI K3 CA were negative, and the microsatellite status was stable, with low mutational burden (4/MB)  (12) anastrozole started 04/18/2020   (13) hyperbaric  treatments started 04/16/2020, 8 weeks plan   PLAN: Saniyya will not be able to receive chemotherapy until the wound dehiscence issue has resolved, and by that time it will be too late for her to receive adjuvant treatment.  The option is that she will not receive any further CMF chemotherapy.  Accordingly she may start anastrozole now.  We discussed the possible toxicities side effects and complications of this agent which will not in any way affect wound healing.  Once the wound is under better control we will start olaparib.  I am going to start trying to get that for her.  She is going to see me again 25/36/6440 but we will certainly not start olaparib until then and possibly not even at that point, since she may not be sufficiently healed by then  We  discussed all all this affects her risk of recurrence of the cancer and her chance of cure.  She knows to call for any other issue that may develop before the next visit  Total encounter time 35 minutes.*  Porshia Blizzard, [name], MD  04/18/20 2:52 PM Medical Oncology and Hematology [facility] [place], [place] [address] Tel. [phone]    Fax. [phone]   I, Wilburn Mylar, am acting as scribe for Dr. [name]. Ermal Brzozowski.  I, Lurline Del MD, have reviewed the above documentation for accuracy and completeness, and I agree with the above.   *Total Encounter Time as defined by the Centers for Medicare and Medicaid Services includes, in addition to the face-to-face time of a patient visit (documented in the note above) non-face-to-face time: obtaining and reviewing outside history, ordering and reviewing medications, tests or procedures, care coordination (communications with other health care professionals or caregivers) and documentation in the medical record.

## 2020-04-18 ENCOUNTER — Encounter (HOSPITAL_BASED_OUTPATIENT_CLINIC_OR_DEPARTMENT_OTHER): Payer: Medicaid Other | Admitting: Internal Medicine

## 2020-04-18 ENCOUNTER — Telehealth: Payer: Self-pay | Admitting: Pharmacy Technician

## 2020-04-18 ENCOUNTER — Inpatient Hospital Stay: Payer: Medicaid Other | Attending: Oncology | Admitting: Oncology

## 2020-04-18 ENCOUNTER — Other Ambulatory Visit: Payer: Self-pay

## 2020-04-18 ENCOUNTER — Other Ambulatory Visit (HOSPITAL_COMMUNITY): Payer: Self-pay

## 2020-04-18 VITALS — BP 119/77 | HR 100 | Temp 97.7°F | Resp 17 | Ht 67.0 in | Wt 260.0 lb

## 2020-04-18 DIAGNOSIS — C50912 Malignant neoplasm of unspecified site of left female breast: Secondary | ICD-10-CM | POA: Diagnosis not present

## 2020-04-18 DIAGNOSIS — Z171 Estrogen receptor negative status [ER-]: Secondary | ICD-10-CM | POA: Diagnosis not present

## 2020-04-18 DIAGNOSIS — C50412 Malignant neoplasm of upper-outer quadrant of left female breast: Secondary | ICD-10-CM

## 2020-04-18 DIAGNOSIS — Z1509 Genetic susceptibility to other malignant neoplasm: Secondary | ICD-10-CM

## 2020-04-18 DIAGNOSIS — Z1501 Genetic susceptibility to malignant neoplasm of breast: Secondary | ICD-10-CM | POA: Diagnosis not present

## 2020-04-18 DIAGNOSIS — N641 Fat necrosis of breast: Secondary | ICD-10-CM | POA: Diagnosis not present

## 2020-04-18 DIAGNOSIS — Z1502 Genetic susceptibility to malignant neoplasm of ovary: Secondary | ICD-10-CM

## 2020-04-18 MED ORDER — OLAPARIB 150 MG PO TABS
300.0000 mg | ORAL_TABLET | Freq: Two times a day (BID) | ORAL | 6 refills | Status: DC
  Filled 2020-04-18 – 2020-05-20 (×2): qty 120, 30d supply, fill #0

## 2020-04-18 NOTE — Progress Notes (Signed)
Sarah, Khan ([id number]) Visit Report for 04/18/2020 HBO Details Patient Name: Date of Service: [name] V [name] 04/18/2020 8:00 A M Medical Record Number: [id number] Patient Account Number: [id number] Date of Birth/Sex: Treating RN: 05-21-1970 (50 y.o. 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 HBO Treatment Details Treatment Number: 3 Patient Type: Outpatient Chamber Type: Monoplace Chamber Serial #: [id number] Treatment Protocol: 2.0 ATA with 90 minutes oxygen, with two 5 minute air breaks Treatment Details Compression Rate Down: 1.5 psi / minute De-Compression Rate Up: 2.0 psi / minute A breaks and breathing ir Compress Tx Pressure periods Decompress Decompress Begins Reached (leave unused spaces Begins Ends blank) Chamber Pressure (ATA 1 2 2 2 2 2  --2 1 ) Clock Time (24 hr) 08:13 08:24 08:54 08:59 09:29 09:34 - - 10:04 10:12 Treatment Length: 119 (minutes) Treatment Segments: 4 Vital Signs Capillary Blood Glucose Reference Range: 80 - 120 mg / dl HBO Diabetic Blood Glucose Intervention Range: <131 mg/dl or >249 mg/dl Time Vitals Blood Respiratory Capillary Blood Glucose Pulse Action Type: Pulse: Temperature: Taken: Pressure: Rate: Glucose (mg/dl): Meter #: Oximetry (%) Taken: Pre 08:00 123/77 89 18 98.4 Post 10:12 103/66 78 18 98.2 Treatment Response Treatment Completion Status: Treatment Completed without Adverse Event Treatment Notes After treatment patient asked if she can try treatment tomorrow at originally ordered pressure of 2.5 ATA, yesterday her surgeon prescribed Ativan for her to take prior to  treatment, she tolerated treatment much better today. I spoke with Dr. Dellia Nims, he states it is ok to increase pressure back to 2.5 ATA with 2 air breaks, starting tomorrow. Captola Teschner Notes No concerns with treatment given. I note the issue yesterday. She tolerated a dive today at 2 atm with two 5-minute air breaks. Patient request to try again at 2.5 atm and I am not opposed to this. In talking to our HBO people it did not seem that this was oxygen toxicity related rather anxiety. Physician HBO Attestation: I certify that I supervised this HBO treatment in accordance with Medicare guidelines. A trained emergency response team is readily available per Yes hospital policies and procedures. Continue HBOT as ordered. Yes Electronic Signature(s) Signed: 04/18/2020 5:23:41 PM By: [name] MD Entered By: [name] on 04/18/2020 15:00:24 -------------------------------------------------------------------------------- HBO Safety Checklist Details Patient Name: Date of Service: [name], [name] 04/18/2020 8:00 A M Medical Record Number: [id number] Patient Account Number: [id number] Date of Birth/Sex: Treating RN: 11-02-1970 (50 y.o. [name] Primary Care Gracie Gupta: [name] Other Clinician: Referring Olaoluwa Grieder: Treating Gavin Faivre/Extender: [name] in Treatment: 1 HBO Safety Checklist Items Safety Checklist Consent Form Signed Patient voided / foley secured and emptied When did you last eato 0700 Last dose of injectable or oral agent na Ostomy pouch emptied and vented if applicable NA All implantable devices assessed, documented and approved Intravenous access site secured and place NA Valuables secured Linens and cotton and cotton/polyester blend (less than 51% polyester) Personal oil-based products / skin lotions / body lotions removed Wigs or hairpieces removed Smoking or tobacco materials removed Books / newspapers /  magazines / loose paper removed Cologne, aftershave, perfume and deodorant removed Jewelry removed (may wrap wedding band) Make-up removed Hair  care products removed Battery operated devices (external) removed Heating patches and chemical warmers removed Titanium eyewear removed Nail polish cured greater than 10 hours Casting material cured greater than 10 hours NA Hearing aids removed NA Loose dentures or partials removed Prosthetics have been removed NA Patient demonstrates correct use of air break device (if applicable) Patient concerns have been addressed Patient grounding bracelet on and cord attached to chamber Specifics for Inpatients (complete in addition to above) Medication sheet sent with patient NA Intravenous medications needed or due during therapy sent with patient NA Drainage tubes (e.g. nasogastric tube or chest tube secured and vented) NA Endotracheal or Tracheotomy tube secured NA Cuff deflated of air and inflated with saline NA Airway suctioned NA Electronic Signature(s) Signed: 04/18/2020 6:17:09 PM By: Levan Hurst RN, BSN Entered By: Levan Hurst on 04/18/2020 10:39:36

## 2020-04-18 NOTE — Telephone Encounter (Signed)
Oral Oncology Patient Advocate Encounter  After completing a benefits investigation, prior authorization for [name] is not required at this time through [facility] managed medicaid.  Patient's copay is $0.00.    [place] [facility] [place] Phone [phone] Fax [phone] 04/18/2020 3:47 PM

## 2020-04-18 NOTE — Progress Notes (Signed)
[name], [name] ([id number]) Visit Report for 04/18/2020 Arrival Information Details Patient Name: Date of Service: [name]. 04/18/2020 8:00 A M Medical Record Number: [id number] Patient Account Number: [id number] Date of Birth/Sex: Treating RN: 01-09-1970 (50 y.o. [name] Primary Care Lizanne Erker: [name] Other Clinician: Referring Lenorris Karger: Treating Giannah Zavadil/Extender: [name] in Treatment: 1 Visit Information History Since Last Visit Added or deleted any medications: No Patient Arrived: Ambulatory Any new allergies or adverse reactions: No Arrival Time: 08:00 Had a fall or experienced change in No Accompanied By: alone activities of daily living that may affect Transfer Assistance: None risk of falls: Patient Requires Transmission-Based Precautions: No Signs or symptoms of abuse/neglect since last visito No Patient Has Alerts: No Hospitalized since last visit: No Implantable device outside of the clinic excluding No cellular tissue based products placed in the center since last visit: Has Dressing in Place as Prescribed: Yes Pain Present Now: No Electronic Signature(s) Signed: 04/18/2020 6:17:09 PM By: Levan Hurst RN, BSN Entered By: Levan Hurst on 04/18/2020 10:04:12 -------------------------------------------------------------------------------- Encounter Discharge Information Details Patient Name: Date of Service: [name]. 04/18/2020 8:00 A M Medical Record Number: [id number] Patient Account Number: [id number] Date of Birth/Sex: Treating RN: [birth year] (50 y.o. [name] Primary Care Vanisha Whiten: [name] Other Clinician: Referring Adaya Garmany: Treating Xuan Mateus/Extender: [name] in Treatment: 1 Encounter Discharge Information Items Discharge Condition: Stable Ambulatory Status: Ambulatory Discharge Destination: Home Transportation: Private  Auto Accompanied By: alone Schedule Follow-up Appointment: Yes Clinical Summary of Care: Patient Declined Electronic Signature(s) Signed: 04/18/2020 6:17:09 PM By: Levan Hurst RN, BSN Entered By: Levan Hurst on 04/18/2020 10:46:19 -------------------------------------------------------------------------------- Vitals Details Patient Name: Date of Service: [name], [name]. 04/18/2020 8:00 A M Medical Record Number: [id number] Patient Account Number: [id number] Date of Birth/Sex: Treating RN: May 03, 1970 (50 y.o. [name] Primary Care Donnelle Olmeda: [name] Other Clinician: Referring Kellan Boehlke: Treating Chayce Robbins/Extender: [name] in Treatment: 1 Vital Signs Time Taken: 08:00 Temperature (F): 98.4 Height (in): 67 Pulse (bpm): 89 Weight (lbs): 257 Respiratory Rate (breaths/min): 18 Body Mass Index (BMI): 40.2 Blood Pressure (mmHg): 123/77 Reference Range: 80 - 120 mg / dl Electronic Signature(s) Signed: 04/18/2020 6:17:09 PM By: Levan Hurst RN, BSN Entered By: Levan Hurst on 04/18/2020 10:04:36

## 2020-04-18 NOTE — Progress Notes (Signed)
[name], [name] ([id number]) Visit Report for 04/18/2020 SuperBill Details Patient Name: Date of Service: [name] 04/18/2020 Medical Record Number: [id number] Patient Account Number: [id number] Date of Birth/Sex: Treating RN: Sep 25, 1970 (50 y.o. 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

## 2020-04-18 NOTE — Progress Notes (Signed)
Sarah, Khan ([id number]) Visit Report for 04/16/2020 Arrival Information Details Patient Name: Date of Service: Sarah Sarah Romberg [name] 04/16/2020 7:30 Sarah M Medical Record Number: [id number] Patient Account Number: [id number] Date of Birth/Sex: Treating RN: April 10, 1970 (50 y.o. [name], [name] Primary Care Aariah Godette: [name] Other Clinician: Referring Ronita Hargreaves: Treating Taleisha Kaczynski/Extender: [name] in Treatment: 1 Visit Information History Since Last Visit Added or deleted any medications: No Patient Arrived: Ambulatory Any new allergies or adverse reactions: No Arrival Time: 07:59 Had Sarah fall or experienced change in No Accompanied By: self activities of daily living that may affect Transfer Assistance: None risk of falls: Patient Identification Verified: Yes Signs or symptoms of abuse/neglect since last visito No Secondary Verification Process Completed: Yes Hospitalized since last visit: No Patient Requires Transmission-Based Precautions: No Implantable device outside of the clinic excluding No Patient Has Alerts: No cellular tissue based products placed in the center since last visit: Has Dressing in Place as Prescribed: Yes Pain Present Now: No Notes Per patient [name] removed some sutures. x6 sutures left. Electronic Signature(s) Signed: 04/16/2020 6:06:00 PM By: Deon Pilling Entered By: Deon Pilling on 04/16/2020 08:04:21 -------------------------------------------------------------------------------- Clinic Level of Care Assessment Details Patient Name: Date of Service: Sarah Sarah Romberg [name] 04/16/2020 7:30 Sarah M Medical Record Number: [id number] Patient Account Number: [id number] Date of Birth/Sex: Treating RN: 01/20/1970 (50 y.o. [name], [name] Primary Care Jaicee Michelotti: [name] Other Clinician: Referring Jannatul Wojdyla: Treating [name] in Treatment: 1 Clinic  Level of Care Assessment Items TOOL 4 Quantity Score X- 1 0 Use when only an EandM is performed on FOLLOW-UP visit ASSESSMENTS - Nursing Assessment / Reassessment X- 1 10 Reassessment of Co-morbidities (includes updates in patient status) X- 1 5 Reassessment of Adherence to Treatment Plan ASSESSMENTS - Wound and Skin Sarah ssessment / Reassessment X - Simple Wound Assessment / Reassessment - one wound 1 5 []  - 0 Complex Wound Assessment / Reassessment - multiple wounds X- 1 10 Dermatologic / Skin Assessment (not related to wound area) ASSESSMENTS - Focused Assessment []  - 0 Circumferential Edema Measurements - multi extremities X- 1 10 Nutritional Assessment / Counseling / Intervention []  - 0 Lower Extremity Assessment (monofilament, tuning fork, pulses) []  - 0 Peripheral Arterial Disease Assessment (using hand held doppler) ASSESSMENTS - Ostomy and/or Continence Assessment and Care []  - 0 Incontinence Assessment and Management []  - 0 Ostomy Care Assessment and Management (repouching, etc.) PROCESS - Coordination of Care X - Simple Patient / Family Education for ongoing care 1 15 []  - 0 Complex (extensive) Patient / Family Education for ongoing care X- 1 10 Staff obtains Programmer, systems, Records, T Results / Process Orders est []  - 0 Staff telephones HHA, Nursing Homes / Clarify orders / etc []  - 0 Routine Transfer to another Facility (non-emergent condition) []  - 0 Routine Hospital Admission (non-emergent condition) []  - 0 New Admissions / Biomedical engineer / Ordering NPWT Apligraf, etc. , []  - 0 Emergency Hospital Admission (emergent condition) X- 1 10 Simple Discharge Coordination []  - 0 Complex (extensive) Discharge Coordination PROCESS - Special Needs []  - 0 Pediatric / Minor Patient Management []  - 0 Isolation Patient Management []  - 0 Hearing / Language / Visual special needs []  - 0 Assessment of Community assistance (transportation, D/C planning,  etc.) []  - 0 Additional assistance / Altered mentation []  - 0 Support Surface(s) Assessment (bed, cushion, seat, etc.) INTERVENTIONS - Wound Cleansing / Measurement  X - Simple Wound Cleansing - one wound 1 5 []  - 0 Complex Wound Cleansing - multiple wounds X- 1 5 Wound Imaging (photographs - any number of wounds) []  - 0 Wound Tracing (instead of photographs) X- 1 5 Simple Wound Measurement - one wound []  - 0 Complex Wound Measurement - multiple wounds INTERVENTIONS - Wound Dressings X - Small Wound Dressing one or multiple wounds 1 10 []  - 0 Medium Wound Dressing one or multiple wounds []  - 0 Large Wound Dressing one or multiple wounds []  - 0 Application of Medications - topical []  - 0 Application of Medications - injection INTERVENTIONS - Miscellaneous []  - 0 External ear exam []  - 0 Specimen Collection (cultures, biopsies, blood, body fluids, etc.) []  - 0 Specimen(s) / Culture(s) sent or taken to Lab for analysis []  - 0 Patient Transfer (multiple staff / Civil Service fast streamer / Similar devices) []  - 0 Simple Staple / Suture removal (25 or less) []  - 0 Complex Staple / Suture removal (26 or more) []  - 0 Hypo / Hyperglycemic Management (close monitor of Blood Glucose) []  - 0 Ankle / Brachial Index (ABI) - do not check if billed separately X- 1 5 Vital Signs Has the patient been seen at the hospital within the last three years: Yes Total Score: 105 Level Of Care: New/Established - Level 3 Electronic Signature(s) Signed: 04/16/2020 6:06:00 PM By: Deon Pilling Entered By: Deon Pilling on 04/16/2020 08:32:01 -------------------------------------------------------------------------------- Encounter Discharge Information Details Patient Name: Date of Service: Sarah Crochet V [name] 04/16/2020 7:30 Sarah M Medical Record Number: [id number] Patient Account Number: [id number] Date of Birth/Sex: Treating RN: February 16, 1970 (50 y.o. [name] Primary Care Tayvia Faughnan: [name] Other Clinician: Referring Afnan Emberton: Treating Jionni Helming/Extender: [name] in Treatment: 1 Encounter Discharge Information Items Discharge Condition: Stable Ambulatory Status: Ambulatory Discharge Destination: Home Transportation: Private Auto Accompanied ByRaechel Chute Schedule Follow-up Appointment: Yes Clinical Summary of Care: Provided on 04/16/2020 Form Type Recipient Paper Patient Patient Electronic Signature(s) Signed: 04/16/2020 9:44:45 AM By: Lorrin Jackson Entered By: Lorrin Jackson on 04/16/2020 09:44:44 -------------------------------------------------------------------------------- Lower Extremity Assessment Details Patient Name: Date of Service: Sarah Crochet V [name] 04/16/2020 7:30 Sarah M Medical Record Number: [id number] Patient Account Number: [id number] Date of Birth/Sex: Treating RN: 07/26/1970 (50 y.o. [name] Primary Care Kaz Auld: [name] Other Clinician: Referring Arieal Cuoco: Treating Harla Mensch/Extender: [name] in Treatment: 1 Electronic Signature(s) Signed: 04/16/2020 6:06:00 PM By: Deon Pilling Entered By: Deon Pilling on 04/16/2020 08:08:54 -------------------------------------------------------------------------------- Multi Wound Chart Details Patient Name: Date of Service: Sarah Crochet V [name] 04/16/2020 7:30 Sarah M Medical Record Number: [id number] Patient Account Number: [id number] Date of Birth/Sex: Treating RN: 03/18/1970 (50 y.o. [name], [name] Primary Care Bellamia Ferch: [name] Other Clinician: Referring Tempie Gibeault: Treating Nayara Taplin/Extender: [name] in Treatment: 1 Vital Signs Height(in): 67 Pulse(bpm): 99 Weight(lbs): 257 Blood Pressure(mmHg): 123/82 Body Mass Index(BMI): 40 Temperature(F): 98.5 Respiratory Rate(breaths/min): 16 Photos: [1:No Photos Left Chest] [N/Sarah:N/Sarah N/Sarah] Wound Location: [1:Surgical Injury]  [N/Sarah:N/Sarah] Wounding Event: [1:Open Surgical Wound] [N/Sarah:N/Sarah] Primary Etiology: [1:Necrosis (Radiation)] [N/Sarah:N/Sarah] Secondary Etiology: [1:Hypertension, Osteoarthritis, ReceivedN/Sarah] Comorbid History: [1:Chemotherapy, Received Radiation 01/16/2020] [N/Sarah:N/Sarah] Date Acquired: [1:1] [N/Sarah:N/Sarah] Weeks of Treatment: [1:Open] [N/Sarah:N/Sarah] Wound Status: [1:6.1x3.4x1.1] [N/Sarah:N/Sarah] Measurements L x W x D (cm) [1:16.289] [N/Sarah:N/Sarah] Sarah (cm) : rea [1:17.918] [N/Sarah:N/Sarah] Volume (cm) : [1:-156.00%] [N/Sarah:N/Sarah] % Reduction in Sarah rea: [1:-156.00%] [N/Sarah:N/Sarah] % Reduction in Volume: [1:3] Position 1 (o'clock): [  1:3.8] Maximum Distance 1 (cm): [1:2] Position 2 (o'clock): [1:1.4] Maximum Distance 2 (cm): [1:9] Starting Position 1 (o'clock): [1:12] Ending Position 1 (o'clock): [1:0.9] Maximum Distance 1 (cm): [1:Yes] [N/Sarah:N/Sarah] Tunneling: [1:Yes] [N/Sarah:N/Sarah] Undermining: [1:Full Thickness Without Exposed] [N/Sarah:N/Sarah] Classification: [1:Support Structures Medium] [N/Sarah:N/Sarah] Exudate Amount: [1:Serosanguineous] [N/Sarah:N/Sarah] Exudate Type: [1:red, brown] [N/Sarah:N/Sarah] Exudate Color: [1:Well defined, not attached] [N/Sarah:N/Sarah] Wound Margin: [1:Small (1-33%)] [N/Sarah:N/Sarah] Granulation Amount: [1:Red, Pink] [N/Sarah:N/Sarah] Granulation Quality: [1:Large (67-100%)] [N/Sarah:N/Sarah] Necrotic Amount: [1:Fat Layer (Subcutaneous Tissue): Yes N/Sarah] Exposed Structures: [1:Fascia: No Tendon: No Muscle: No Joint: No Bone: No None] [N/Sarah:N/Sarah] Epithelialization: [1:x4 sutures noted in wound bed.] [N/Sarah:N/Sarah] Treatment Notes Electronic Signature(s) Signed: 04/16/2020 4:33:39 PM By: [name] MD Signed: 04/18/2020 5:45:48 PM By: Rhae Hammock RN Entered By: [name] on 04/16/2020 09:12:47 -------------------------------------------------------------------------------- Multi-Disciplinary Care Plan Details Patient Name: Date of Service: Sarah Crochet V [name] 04/16/2020 7:30 Sarah M Medical Record Number: [id number] Patient Account Number: [id number] Date of  Birth/Sex: Treating RN: [birth year] (50 y.o. [name], [name] Primary Care Ignazio Kincaid: [name] Other Clinician: Referring Kinda Pottle: Treating Falisa Lamora/Extender: [name] in Treatment: 1 Active Inactive Wound/Skin Impairment Nursing Diagnoses: Impaired tissue integrity Knowledge deficit related to smoking impact on wound healing Knowledge deficit related to ulceration/compromised skin integrity Goals: Patient will demonstrate Sarah reduced rate of smoking or cessation of smoking Date Initiated: 04/09/2020 Target Resolution Date: 05/11/2020 Goal Status: Active Patient will have Sarah decrease in wound volume by X% from date: (specify in notes) Date Initiated: 04/09/2020 Target Resolution Date: 05/11/2020 Goal Status: Active Patient/caregiver will verbalize understanding of skin care regimen Date Initiated: 04/09/2020 Target Resolution Date: 05/11/2020 Goal Status: Active Ulcer/skin breakdown will have Sarah volume reduction of 30% by week 4 Date Initiated: 04/09/2020 Target Resolution Date: 05/11/2020 Goal Status: Active Interventions: Assess patient/caregiver ability to obtain necessary supplies Assess patient/caregiver ability to perform ulcer/skin care regimen upon admission and as needed Assess ulceration(s) every visit Provide education on smoking Notes: Electronic Signature(s) Signed: 04/16/2020 6:06:00 PM By: Deon Pilling Entered By: Deon Pilling on 04/16/2020 07:54:54 -------------------------------------------------------------------------------- Pain Assessment Details Patient Name: Date of Service: Sarah Crochet V [name] 04/16/2020 7:30 Sarah M Medical Record Number: [id number] Patient Account Number: [id number] Date of Birth/Sex: Treating RN: [birth year] (50 y.o. [name] Primary Care Merle Whitehorn: [name] Other Clinician: Referring Bladimir Auman: Treating Patrisia Faeth/Extender: [name] in Treatment: 1 Active  Problems Location of Pain Severity and Description of Pain Patient Has Paino No Site Locations Rate the pain. Rate the pain. Current Pain Level: 0 Pain Management and Medication Current Pain Management: Medication: No Cold Application: No Rest: No Massage: No Activity: No T.E.N.S.: No Heat Application: No Leg drop or elevation: No Is the Current Pain Management Adequate: Adequate How does your wound impact your activities of daily livingo Sleep: No Bathing: No Appetite: No Relationship With Others: No Bladder Continence: No Emotions: No Bowel Continence: No Work: No Toileting: No Drive: No Dressing: No Hobbies: No Electronic Signature(s) Signed: 04/16/2020 6:06:00 PM By: Deon Pilling Entered By: Deon Pilling on 04/16/2020 08:08:49 -------------------------------------------------------------------------------- Patient/Caregiver Education Details Patient Name: Date of Service: Sarah [name] 4/12/2022andnbsp7:30 Sarah M Medical Record Number: [id number] Patient Account Number: [id number] Date of Birth/Gender: Treating RN: 1970-03-18 (50 y.o. [name] Primary Care Physician: [name] Other Clinician: Referring Physician: Treating Physician/Extender: [name] in Treatment: 1 Education Assessment Education Provided To: Patient Education Topics Provided Wound/Skin Impairment: Handouts: Skin Care  Do's and Dont's Methods: Explain/Verbal Responses: Reinforcements needed Electronic Signature(s) Signed: 04/16/2020 6:06:00 PM By: Deon Pilling Entered By: Deon Pilling on 04/16/2020 07:55:13 -------------------------------------------------------------------------------- Wound Assessment Details Patient Name: Date of Service: Sarah Wallie Char V [name] 04/16/2020 7:30 Sarah M Medical Record Number: [id number] Patient Account Number: [id number] Date of Birth/Sex: Treating RN: [birth year] (50 y.o. [name], [name] Primary  Care Shanece Cochrane: [name] Other Clinician: Referring Lynette Topete: Treating Dariya Gainer/Extender: [name] in Treatment: 1 Wound Status Wound Number: 1 Primary Open Surgical Wound Etiology: Wound Location: Left Chest Secondary Necrosis (Radiation) Wounding Event: Surgical Injury Etiology: Date Acquired: 01/16/2020 Wound Status: Open Weeks Of Treatment: 1 Comorbid Hypertension, Osteoarthritis, Received Chemotherapy, Clustered Wound: No History: Received Radiation Photos Wound Measurements Length: (cm) 6.1 Width: (cm) 3.4 Depth: (cm) 1.1 Area: (cm) 16.289 Volume: (cm) 17.918 % Reduction in Area: -156% % Reduction in Volume: -156% Epithelialization: None Tunneling: Yes Location 1 Position (o'clock): 3 Maximum Distance: (cm) 3.8 Location 2 Position (o'clock): 2 Maximum Distance: (cm) 1.4 Undermining: Yes Starting Position (o'clock): 9 Ending Position (o'clock): 12 Maximum Distance: (cm) 0.9 Wound Description Classification: Full Thickness Without Exposed Support Structu Wound Margin: Well defined, not attached Exudate Amount: Medium Exudate Type: Serosanguineous Exudate Color: red, brown res Foul Odor After Cleansing: No Slough/Fibrino Yes Wound Bed Granulation Amount: Small (1-33%) Exposed Structure Granulation Quality: Red, Pink Fascia Exposed: No Necrotic Amount: Large (67-100%) Fat Layer (Subcutaneous Tissue) Exposed: Yes Necrotic Quality: Adherent Slough Tendon Exposed: No Muscle Exposed: No Joint Exposed: No Bone Exposed: No Assessment Notes x4 sutures noted in wound bed. Treatment Notes Wound #1 (Chest) Wound Laterality: Left Cleanser Normal Saline Discharge Instruction: Cleanse the wound with Normal Saline prior to applying Sarah clean dressing using gauze sponges, not tissue or cotton balls. Peri-Wound Care Topical Primary Dressing KerraCel Ag Gelling Fiber Dressing, 4x5 in (silver alginate) Discharge Instruction:  Ensure to pack into tunnels and undermining. Apply silver alginate to wound bed as instructed Secondary Dressing Zetuvit Plus 4x4 in Discharge Instruction: Apply over primary dressing as directed. Secured With Compression Wrap Compression Stockings Environmental education officer) Signed: 04/16/2020 6:06:00 PM By: Deon Pilling Signed: 04/17/2020 8:29:36 AM By: Sandre Kitty Entered By: Sandre Kitty on 04/16/2020 16:18:57 -------------------------------------------------------------------------------- Vitals Details Patient Name: Date of Service: Sarah Crochet V [name] 04/16/2020 7:30 Sarah M Medical Record Number: [id number] Patient Account Number: [id number] Date of Birth/Sex: Treating RN: 08-Nov-1970 (50 y.o. [name], [name] Primary Care Laryssa Hassing: [name] Other Clinician: Referring Faust Thorington: Treating Ayva Veilleux/Extender: [name] in Treatment: 1 Vital Signs Time Taken: 08:00 Temperature (F): 98.5 Height (in): 67 Pulse (bpm): 99 Weight (lbs): 257 Respiratory Rate (breaths/min): 16 Body Mass Index (BMI): 40.2 Blood Pressure (mmHg): 123/82 Reference Range: 80 - 120 mg / dl Electronic Signature(s) Signed: 04/16/2020 6:06:00 PM By: Deon Pilling Entered By: Deon Pilling on 04/16/2020 08:05:02

## 2020-04-19 ENCOUNTER — Encounter (HOSPITAL_BASED_OUTPATIENT_CLINIC_OR_DEPARTMENT_OTHER): Payer: Medicaid Other | Admitting: Internal Medicine

## 2020-04-19 ENCOUNTER — Other Ambulatory Visit: Payer: Self-pay

## 2020-04-19 DIAGNOSIS — N641 Fat necrosis of breast: Secondary | ICD-10-CM | POA: Diagnosis not present

## 2020-04-21 ENCOUNTER — Other Ambulatory Visit: Payer: Self-pay | Admitting: Oncology

## 2020-04-22 ENCOUNTER — Encounter: Payer: Self-pay | Admitting: *Deleted

## 2020-04-22 ENCOUNTER — Inpatient Hospital Stay (HOSPITAL_BASED_OUTPATIENT_CLINIC_OR_DEPARTMENT_OTHER): Payer: Medicaid Other | Admitting: Oncology

## 2020-04-22 ENCOUNTER — Other Ambulatory Visit (HOSPITAL_COMMUNITY): Payer: Self-pay

## 2020-04-22 ENCOUNTER — Inpatient Hospital Stay: Payer: Medicaid Other

## 2020-04-22 ENCOUNTER — Telehealth: Payer: Self-pay | Admitting: Pharmacist

## 2020-04-22 ENCOUNTER — Other Ambulatory Visit: Payer: Self-pay

## 2020-04-22 ENCOUNTER — Encounter (HOSPITAL_BASED_OUTPATIENT_CLINIC_OR_DEPARTMENT_OTHER): Payer: Medicaid Other | Admitting: Internal Medicine

## 2020-04-22 DIAGNOSIS — C50912 Malignant neoplasm of unspecified site of left female breast: Secondary | ICD-10-CM

## 2020-04-22 DIAGNOSIS — N641 Fat necrosis of breast: Secondary | ICD-10-CM | POA: Diagnosis not present

## 2020-04-22 MED ORDER — TRAMADOL HCL 50 MG PO TABS
ORAL_TABLET | Freq: Four times a day (QID) | ORAL | 0 refills | Status: DC | PRN
  Filled 2020-04-22: qty 20, 3d supply, fill #0

## 2020-04-22 NOTE — Progress Notes (Signed)
[name], [name] ([id number]) Visit Report for 04/19/2020 HBO Details Patient Name: Date of Service: Sarah BDELA Sarah [name] 04/19/2020 8:00 Sarah M Medical Record Number: [id number] Patient Account Number: [id number] Date of Birth/Sex: Treating RN: [birth year] (50 y.o. 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 HBO Treatment Details Treatment Number: 4 Patient Type: Outpatient Chamber Type: Monoplace Chamber Serial #: [id number] Treatment Protocol: 2.5 ATA with 90 minutes oxygen, with two 5 minute air breaks Treatment Details Compression Rate Down: 1.5 psi / minute De-Compression Rate Up: 2.0 psi / minute Sarah breaks and breathing ir Compress Tx Pressure periods Decompress Decompress Begins Reached (leave unused spaces Begins Ends blank) Chamber Pressure (ATA 1 2.5 2.5 2.5 2.5 2.5 - - 2.5 1 ) Clock Time (24 hr) 08:04 08:19 08:49 08:54 09:24 09:29 - - 09:59 10:11 Treatment Length: 127 (minutes) Treatment Segments: 4 Vital Signs Capillary Blood Glucose Reference Range: 80 - 120 mg / dl HBO Diabetic Blood Glucose Intervention Range: <131 mg/dl or >249 mg/dl Time Vitals Blood Respiratory Capillary Blood Glucose Pulse Action Type: Pulse: Temperature: Taken: Pressure: Rate: Glucose (mg/dl): Meter #: Oximetry (%) Taken: Pre 07:58 126/75 98 18 98.2 Post 10:11 107/66 76 18 98 Treatment Response Treatment Completion Status: Treatment Completed without Adverse Event Aimar Borghi Notes We increased the patient's treatment to her previous orders which were 2.5 atm with air breaks 2 x 5 minutes. We had reduced her yesterday because of the difficulty  she had on Wednesday. She felt she could handle it and indeed today she had no complaints. Likely some form of anxiety spell on Wednesday. Everything went well today. No problems Physician HBO Attestation: I certify that I supervised this HBO treatment in accordance with Medicare guidelines. Sarah trained emergency response team is readily available per Yes hospital policies and procedures. Continue HBOT as ordered. Yes Electronic Signature(s) Signed: 04/19/2020 4:57:27 PM By: [name] MD Entered By: [name] on 04/19/2020 15:49:35 -------------------------------------------------------------------------------- HBO Safety Checklist Details Patient Name: Date of Service: [name], Sarah V [name] 04/19/2020 8:00 Sarah M Medical Record Number: [id number] Patient Account Number: [id number] Date of Birth/Sex: Treating RN: 11-24-1970 (50 y.o. [name] Primary Care Shawneequa Baldridge: [name] Other Clinician: Referring Maximiano Lott: Treating Elgie Landino/Extender: [name] in Treatment: 1 HBO Safety Checklist Items Safety Checklist Consent Form Signed Patient voided / foley secured and emptied When did you last eato 0700 Last dose of injectable or oral agent na Ostomy pouch emptied and vented if applicable NA All implantable devices assessed, documented and approved Intravenous access site secured and place NA Valuables secured Linens and cotton and cotton/polyester blend (less than 51% polyester) Personal oil-based products / skin lotions / body lotions removed Wigs or hairpieces removed Smoking or tobacco materials removed Books / newspapers / magazines / loose paper removed Cologne, aftershave, perfume and deodorant removed Jewelry removed (may wrap wedding band) Make-up removed Hair care products removed Battery operated devices (external) removed Heating patches and chemical warmers removed Titanium eyewear removed Nail polish cured  greater than 10 hours Casting material cured greater than 10 hours NA Hearing aids removed NA Loose dentures or partials removed Prosthetics have been removed NA Patient demonstrates correct use of air break device (  if applicable) Patient concerns have been addressed Patient grounding bracelet on and cord attached to chamber Specifics for Inpatients (complete in addition to above) Medication sheet sent with patient NA Intravenous medications needed or due during therapy sent with patient NA Drainage tubes (e.g. nasogastric tube or chest tube secured and vented) NA Endotracheal or Tracheotomy tube secured NA Cuff deflated of air and inflated with saline NA Airway suctioned NA Electronic Signature(s) Signed: 04/22/2020 5:39:31 PM By: Levan Hurst RN, BSN Entered By: Levan Hurst on 04/19/2020 15:24:35

## 2020-04-22 NOTE — Progress Notes (Signed)
BENNY, HENRIE ([id number]) Visit Report for 04/19/2020 SuperBill Details Patient Name: Date of Service: [name]. 04/19/2020 Medical Record Number: [id number] Patient Account Number: [id number] Date of Birth/Sex: Treating RN: February 07, 1970 (50 y.o. 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 Disruption of external operation (surgical) wound, not elsewhere classified, subsequent encounter 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 Facility Procedures CPT4 Code Description Modifier Quantity [id number] G0277-(Facility Use Only) HBOT full body chamber, 49min , 4 Physician Procedures Quantity CPT4 Code Description Modifier [id number] - WC PHYS HYPERBARIC OXYGEN THERAPY 1 ICD-10 Diagnosis Description 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

## 2020-04-22 NOTE — Progress Notes (Signed)
DAVENA, JULIAN ([id number]) Visit Report for 04/19/2020 Arrival Information Details Patient Name: Date of Service: [name] 04/19/2020 8:00 A M Medical Record Number: [id number] Patient Account Number: [id number] Date of Birth/Sex: Treating RN: 1970/03/10 (50 y.o. [name], [name] Primary Care Sharman Garrott: [name] Other Clinician: Referring Alix Stowers: Treating Georga Stys/Extender: [name] in Treatment: 1 Visit Information History Since Last Visit Added or deleted any medications: No Patient Arrived: Ambulatory Any new allergies or adverse reactions: No Arrival Time: 07:58 Had a fall or experienced change in No Accompanied By: alone activities of daily living that may affect Transfer Assistance: None risk of falls: Patient Identification Verified: Yes Signs or symptoms of abuse/neglect since last visito No Secondary Verification Process Completed: Yes Hospitalized since last visit: No Patient Requires Transmission-Based Precautions: No Implantable device outside of the clinic excluding No Patient Has Alerts: No cellular tissue based products placed in the center since last visit: Pain Present Now: No Electronic Signature(s) Signed: 04/22/2020 5:39:31 PM By: Levan Hurst RN, BSN Entered By: Levan Hurst on 04/19/2020 15:23:35 -------------------------------------------------------------------------------- Encounter Discharge Information Details Patient Name: Date of Service: [name] 04/19/2020 8:00 A M Medical Record Number: [id number] Patient Account Number: [id number] Date of Birth/Sex: Treating RN: 1970-10-20 (50 y.o. [name] Primary Care Conley Pawling: [name] Other Clinician: Referring Manus Weedman: Treating Arneda Sappington/Extender: [name] in Treatment: 1 Encounter Discharge Information Items Discharge Condition: Stable Ambulatory Status: Ambulatory Discharge  Destination: Home Transportation: Private Auto Accompanied By: alone Schedule Follow-up Appointment: Yes Clinical Summary of Care: Patient Declined Electronic Signature(s) Signed: 04/22/2020 5:39:31 PM By: Levan Hurst RN, BSN Entered By: Levan Hurst on 04/19/2020 15:26:32 -------------------------------------------------------------------------------- Vitals Details Patient Name: Date of Service: [name], [name] 04/19/2020 8:00 A M Medical Record Number: [id number] Patient Account Number: [id number] Date of Birth/Sex: Treating RN: 1970/04/29 (50 y.o. [name] Primary Care Isacc Turney: [name] Other Clinician: Referring Saliha Salts: Treating Lashara Urey/Extender: [name] in Treatment: 1 Vital Signs Time Taken: 07:58 Temperature (F): 98.2 Height (in): 67 Pulse (bpm): 98 Weight (lbs): 257 Respiratory Rate (breaths/min): 18 Body Mass Index (BMI): 40.2 Blood Pressure (mmHg): 126/75 Reference Range: 80 - 120 mg / dl Electronic Signature(s) Signed: 04/22/2020 5:39:31 PM By: Levan Hurst RN, BSN Entered By: Levan Hurst on 04/19/2020 15:23:57

## 2020-04-22 NOTE — Progress Notes (Signed)
[name], [name] ([id number]) Visit Report for 04/22/2020 HBO Details Patient Name: Date of Service: [name] Sarah [name] Sarah [name] 04/22/2020 8:00 Sarah M Medical Record Number: [id number] Patient Account Number: [id number] Date of Birth/Sex: Treating RN: [birth year] (50 y.o. 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 HBO Treatment Details Treatment Number: 5 Patient Type: Outpatient Chamber Type: Monoplace Chamber Serial #: [id number] Treatment Protocol: 2.5 ATA with 90 minutes oxygen, with two 5 minute air breaks Treatment Details Compression Rate Down: 2.0 psi / minute De-Compression Rate Up: 2.0 psi / minute Sarah breaks and breathing ir Compress Tx Pressure periods Decompress Decompress Begins Reached (leave unused spaces Begins Ends blank) Chamber Pressure (ATA 1 2.5 2.5 2.5 2.5 2.5 - - 2.5 1 ) Clock Time (24 hr) 08:13 08:25 08:55 09:00 09:30 09:35 - - 10:05 10:15 Treatment Length: 122 (minutes) Treatment Segments: 4 Vital Signs Capillary Blood Glucose Reference Range: 80 - 120 mg / dl HBO Diabetic Blood Glucose Intervention Range: <131 mg/dl or >249 mg/dl Time Vitals Blood Respiratory Capillary Blood Glucose Pulse Action Type: Pulse: Temperature: Taken: Pressure: Rate: Glucose (mg/dl): Meter #: Oximetry (%) Taken: Pre 07:58 122/77 87 16 98 Post 10:18 99/77 67 16 98.1 Treatment Response Treatment Toleration: Well Treatment Completion Status: Treatment Completed without Adverse Event [name] Notes No concerns with treatment given Physician HBO Attestation: I certify that I supervised this HBO treatment in accordance with  Medicare guidelines. Sarah trained emergency response team is readily available per Yes hospital policies and procedures. Continue HBOT as ordered. Yes Electronic Signature(s) Signed: 04/22/2020 5:05:50 PM By: [name] MD Entered By: [name] on 04/22/2020 17:04:31 -------------------------------------------------------------------------------- HBO Safety Checklist Details Patient Name: Date of Service: [name] Sarah [name] 04/22/2020 8:00 Sarah M Medical Record Number: [id number] Patient Account Number: [id number] Date of Birth/Sex: Treating RN: 1970/12/22 (50 y.o. [name], [name] Primary Care Zaheer Wageman: [name] Other Clinician: Referring Lashya Passe: Treating Markhi Kleckner/Extender: [name] in Treatment: 1 HBO Safety Checklist Items Safety Checklist Consent Form Signed Patient voided / foley secured and emptied When did you last eato this morning Last dose of injectable or oral agent n/Sarah Ostomy pouch emptied and vented if applicable NA All implantable devices assessed, documented and approved NA Intravenous access site secured and place NA Valuables secured Linens and cotton and cotton/polyester blend (less than 51% polyester) Personal oil-based products / skin lotions / body lotions removed NA Wigs or hairpieces removed NA Smoking or tobacco materials removed NA Books / newspapers / magazines / loose paper removed NA Cologne, aftershave, perfume and deodorant removed NA Jewelry removed (may wrap wedding band) Make-up removed NA Hair care products removed NA Battery operated devices (external) removed NA Heating patches and chemical warmers removed NA Titanium eyewear removed NA Nail polish cured greater than 10 hours Casting material cured greater than 10 hours NA Hearing aids removed NA Loose dentures or partials removed NA Prosthetics have been removed NA Patient demonstrates correct use of air break device  (if applicable) Patient concerns have been addressed Patient grounding bracelet on and cord attached to chamber Specifics for Inpatients (complete in addition to above) Medication sheet sent with patient Intravenous medications needed or due during therapy sent with patient  Drainage tubes (e.g. nasogastric tube or chest tube secured and vented) Endotracheal or Tracheotomy tube secured Cuff deflated of air and inflated with saline Airway suctioned Electronic Signature(s) Signed: 04/22/2020 5:05:09 PM By: Deon Pilling Entered By: Deon Pilling on 04/22/2020 10:49:18

## 2020-04-22 NOTE — Progress Notes (Signed)
This appt was cancelled as pt was seen last week instead

## 2020-04-22 NOTE — Progress Notes (Signed)
[name], [name] ([id number]) Visit Report for 04/22/2020 Arrival Information Details Patient Name: Date of Service: [name] [name] 04/22/2020 8:00 Sarah M Medical Record Number: [id number] Patient Account Number: [id number] Date of Birth/Sex: Treating RN: 05-Sep-1970 (50 y.o. [name], [name] Primary Care Jadine Brumley: [name] Other Clinician: Referring Areeb Corron: Treating Audry Kauzlarich/Extender: [name] in Treatment: 1 Visit Information History Since Last Visit Added or deleted any medications: No Patient Arrived: Ambulatory Any new allergies or adverse reactions: No Arrival Time: 07:58 Had Sarah fall or experienced change in No Accompanied By: self activities of daily living that may affect Transfer Assistance: None risk of falls: Patient Identification Verified: Yes Signs or symptoms of abuse/neglect since last visito No Secondary Verification Process Completed: Yes Hospitalized since last visit: No Patient Requires Transmission-Based Precautions: No Implantable device outside of the clinic excluding No Patient Has Alerts: No cellular tissue based products placed in the center since last visit: Has Dressing in Place as Prescribed: Yes Pain Present Now: No Electronic Signature(s) Signed: 04/22/2020 5:05:09 PM By: Deon Pilling Entered By: Deon Pilling on 04/22/2020 10:48:33 -------------------------------------------------------------------------------- Vitals Details Patient Name: Date of Service: [name], [name] 04/22/2020 8:00 Sarah M Medical Record Number: [id number] Patient Account Number: [id number] Date of Birth/Sex: Treating RN: Oct 30, 1970 (50 y.o. [name], [name] Primary Care Ruven Corradi: [name] Other Clinician: Referring Glendel Jaggers: Treating Jami Ohlin/Extender: [name] in Treatment: 1 Vital Signs Time Taken: 07:58 Temperature (F): 98 Height (in): 67 Pulse (bpm): 87 Weight (lbs):  257 Respiratory Rate (breaths/min): 16 Body Mass Index (BMI): 40.2 Blood Pressure (mmHg): 122/77 Reference Range: 80 - 120 mg / dl Electronic Signature(s) Signed: 04/22/2020 5:05:09 PM By: Deon Pilling Entered By: Deon Pilling on 04/22/2020 10:48:52

## 2020-04-22 NOTE — Telephone Encounter (Signed)
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  Prescription dose and frequency assessed for appropriateness. Patient will not start Lynparza until patient has better wound control per MD, earliest would be mid-May.  CBC w/ Diff and CMP from 04/01/20 assessed, no baseline dose adjustments required.  Current medication list in Epic reviewed, no relevant/significant DDIs with [place] identified.  Evaluated chart and no patient barriers to medication adherence noted.   Prescription has been e-scribed to the [facility] for benefits analysis and approval.  Oral Oncology Clinic will continue to follow for insurance authorization, copayment issues, initial counseling and start date.  Leron Croak, PharmD, BCPS Hematology/Oncology Clinical Pharmacist [facility] [phone] 04/22/2020 9:57 AM

## 2020-04-22 NOTE — Progress Notes (Signed)
ZEINA, AKKERMAN ([id number]) Visit Report for 04/22/2020 SuperBill Details Patient Name: Date of Service: [name] 04/22/2020 Medical Record Number: [id number] Patient Account Number: [id number] Date of Birth/Sex: Treating RN: September 22, 1970 (50 y.o. 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 Disruption of external operation (surgical) wound, not elsewhere classified, subsequent encounter 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 Facility Procedures CPT4 Code Description Modifier Quantity [id number] G0277-(Facility Use Only) HBOT full body chamber, 2min , 4 Physician Procedures Quantity CPT4 Code Description Modifier [id number] - WC PHYS HYPERBARIC OXYGEN THERAPY 1 ICD-10 Diagnosis Description 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

## 2020-04-23 ENCOUNTER — Encounter (HOSPITAL_BASED_OUTPATIENT_CLINIC_OR_DEPARTMENT_OTHER): Payer: Medicaid Other | Admitting: Internal Medicine

## 2020-04-23 ENCOUNTER — Other Ambulatory Visit: Payer: Self-pay

## 2020-04-23 ENCOUNTER — Other Ambulatory Visit (HOSPITAL_COMMUNITY): Payer: Self-pay

## 2020-04-23 DIAGNOSIS — N641 Fat necrosis of breast: Secondary | ICD-10-CM | POA: Diagnosis not present

## 2020-04-23 NOTE — Progress Notes (Signed)
[name], [name] ([id number]) Visit Report for 04/23/2020 Arrival Information Details Patient Name: Date of Service: [name] 04/23/2020 8:00 A M Medical Record Number: [id number] Patient Account Number: [id number] Date of Birth/Sex: Treating RN: May 30, 1970 (50 y.o. [name] Primary Care Kierston Plasencia: [name] Other Clinician: Referring Deadrick Stidd: Treating Jessiah Wojnar/Extender: [name] in Treatment: 2 Visit Information History Since Last Visit Added or deleted any medications: No Patient Arrived: Ambulatory Any new allergies or adverse reactions: No Arrival Time: 08:05 Had a fall or experienced change in No Transfer Assistance: None activities of daily living that may affect Patient Identification Verified: Yes risk of falls: Secondary Verification Process Completed: Yes Signs or symptoms of abuse/neglect since last visito No Patient Requires Transmission-Based Precautions: No Hospitalized since last visit: No Patient Has Alerts: No Implantable device outside of the clinic excluding No cellular tissue based products placed in the center since last visit: Has Dressing in Place as Prescribed: Yes Pain Present Now: Yes Electronic Signature(s) Signed: 04/23/2020 4:57:15 PM By: Lorrin Jackson Entered By: Lorrin Jackson on 04/23/2020 08:05:37 -------------------------------------------------------------------------------- Clinic Level of Care Assessment Details Patient Name: Date of Service: [name], [name] 04/23/2020 8:00 A M Medical Record Number: [id number] Patient Account Number: [id number] Date of Birth/Sex: Treating RN: 09-29-1970 (50 y.o. [name] Primary Care [name]: [name] Other Clinician: Referring Bonnie Roig: Treating Chi Woodham/Extender: [name] in Treatment: 2 Clinic Level of Care Assessment Items TOOL 4 Quantity Score X- 1 0 Use when only an EandM is  performed on FOLLOW-UP visit ASSESSMENTS - Nursing Assessment / Reassessment X- 1 10 Reassessment of Co-morbidities (includes updates in patient status) X- 1 5 Reassessment of Adherence to Treatment Plan ASSESSMENTS - Wound and Skin A ssessment / Reassessment []  - 0 Simple Wound Assessment / Reassessment - one wound X- 1 5 Complex Wound Assessment / Reassessment - multiple wounds []  - 0 Dermatologic / Skin Assessment (not related to wound area) ASSESSMENTS - Focused Assessment []  - 0 Circumferential Edema Measurements - multi extremities []  - 0 Nutritional Assessment / Counseling / Intervention []  - 0 Lower Extremity Assessment (monofilament, tuning fork, pulses) []  - 0 Peripheral Arterial Disease Assessment (using hand held doppler) ASSESSMENTS - Ostomy and/or Continence Assessment and Care []  - 0 Incontinence Assessment and Management []  - 0 Ostomy Care Assessment and Management (repouching, etc.) PROCESS - Coordination of Care []  - 0 Simple Patient / Family Education for ongoing care X- 1 20 Complex (extensive) Patient / Family Education for ongoing care X- 1 10 Staff obtains Programmer, systems, Records, T Results / Process Orders est []  - 0 Staff telephones HHA, Nursing Homes / Clarify orders / etc []  - 0 Routine Transfer to another Facility (non-emergent condition) []  - 0 Routine Hospital Admission (non-emergent condition) []  - 0 New Admissions / Biomedical engineer / Ordering NPWT Apligraf, etc. , []  - 0 Emergency Hospital Admission (emergent condition) []  - 0 Simple Discharge Coordination []  - 0 Complex (extensive) Discharge Coordination PROCESS - Special Needs []  - 0 Pediatric / Minor Patient Management []  - 0 Isolation Patient Management []  - 0 Hearing / Language / Visual special needs []  - 0 Assessment of Community assistance (transportation, D/C planning, etc.) []  - 0 Additional assistance / Altered mentation []  - 0 Support Surface(s) Assessment  (bed, cushion, seat, etc.) INTERVENTIONS - Wound Cleansing / Measurement []  - 0 Simple Wound Cleansing - one wound X- 1 5 Complex Wound Cleansing -  multiple wounds []  - 0 Wound Imaging (photographs - any number of wounds) []  - 0 Wound Tracing (instead of photographs) []  - 0 Simple Wound Measurement - one wound X- 1 5 Complex Wound Measurement - multiple wounds INTERVENTIONS - Wound Dressings []  - 0 Small Wound Dressing one or multiple wounds X- 1 15 Medium Wound Dressing one or multiple wounds []  - 0 Large Wound Dressing one or multiple wounds []  - 0 Application of Medications - topical []  - 0 Application of Medications - injection INTERVENTIONS - Miscellaneous []  - 0 External ear exam []  - 0 Specimen Collection (cultures, biopsies, blood, body fluids, etc.) []  - 0 Specimen(s) / Culture(s) sent or taken to Lab for analysis []  - 0 Patient Transfer (multiple staff / Civil Service fast streamer / Similar devices) []  - 0 Simple Staple / Suture removal (25 or less) []  - 0 Complex Staple / Suture removal (26 or more) []  - 0 Hypo / Hyperglycemic Management (close monitor of Blood Glucose) []  - 0 Ankle / Brachial Index (ABI) - do not check if billed separately X- 1 5 Vital Signs Has the patient been seen at the hospital within the last three years: Yes Total Score: 80 Level Of Care: New/Established - Level 3 Electronic Signature(s) Signed: 04/23/2020 4:57:15 PM By: Lorrin Jackson Entered By: Lorrin Jackson on 04/23/2020 08:21:58 -------------------------------------------------------------------------------- Encounter Discharge Information Details Patient Name: Date of Service: [name] V [name] 04/23/2020 8:00 A M Medical Record Number: [id number] Patient Account Number: [id number] Date of Birth/Sex: Treating RN: [birth year] (50 y.o. [name] Primary Care Jenniferlynn Saad: [name] Other Clinician: Referring Xiong Haidar: Treating Weston Kallman/Extender: [name] in Treatment: 2 Encounter Discharge Information Items Discharge Condition: Stable Ambulatory Status: Ambulatory Discharge Destination: Home Transportation: Private Auto Schedule Follow-up Appointment: Yes Clinical Summary of Care: Provided on 04/23/2020 Form Type Recipient Paper Patient Patient Electronic Signature(s) Signed: 04/23/2020 4:57:15 PM By: Lorrin Jackson Entered By: Lorrin Jackson on 04/23/2020 08:19:47 -------------------------------------------------------------------------------- Pain Assessment Details Patient Name: Date of Service: [name], [name] 04/23/2020 8:00 A M Medical Record Number: [id number] Patient Account Number: [id number] Date of Birth/Sex: Treating RN: 1970/08/23 (50 y.o. [name] Primary Care Rella Egelston: [name] Other Clinician: Referring Pihu Basil: Treating Vikas Wegmann/Extender: [name] in Treatment: 2 Active Problems Location of Pain Severity and Description of Pain Patient Has Paino Yes Site Locations Pain Location: Pain Location: Pain in Ulcers With Dressing Change: Yes Duration of the Pain. Constant / Intermittento Intermittent Rate the pain. Current Pain Level: 3 Character of Pain Describe the Pain: Aching, Tender Pain Management and Medication Current Pain Management: Medication: Yes Cold Application: No Rest: Yes Massage: No Activity: No T.E.N.S.: No Heat Application: No Leg drop or elevation: No Is the Current Pain Management Adequate: Inadequate How does your wound impact your activities of daily livingo Sleep: No Bathing: No Appetite: No Relationship With Others: No Bladder Continence: No Emotions: No Bowel Continence: No Work: No Toileting: No Drive: No Dressing: No Hobbies: No Electronic Signature(s) Signed: 04/23/2020 4:57:15 PM By: Lorrin Jackson Entered By: Lorrin Jackson on 04/23/2020  08:20:27 -------------------------------------------------------------------------------- Patient/Caregiver Education Details Patient Name: Date of Service: [name] 4/19/2022andnbsp8:00 A M Medical Record Number: [id number] Patient Account Number: [id number] Date of Birth/Gender: Treating RN: Nov 21, 1970 (50 y.o. [name] Primary Care Physician: [name] Other Clinician: Referring Physician: Treating Physician/Extender: [name] in Treatment: 2 Education Assessment Education Provided  To: Patient Education Topics Provided Hyperbaric Oxygenation: Methods: Explain/Verbal Responses: State content correctly Wound/Skin Impairment: Methods: Explain/Verbal Responses: State content correctly Electronic Signature(s) Signed: 04/23/2020 4:57:15 PM By: Lorrin Jackson Entered By: Lorrin Jackson on 04/23/2020 08:19:28 -------------------------------------------------------------------------------- Wound Assessment Details Patient Name: Date of Service: [name], [name] 04/23/2020 8:00 A M Medical Record Number: [id number] Patient Account Number: [id number] Date of Birth/Sex: Treating RN: June 18, 1970 (50 y.o. [name] Primary Care Darleen Moffitt: [name] Other Clinician: Referring Jann Ra: Treating Kaulin Chaves/Extender: [name] in Treatment: 2 Wound Status Wound Number: 1 Primary Open Surgical Wound Etiology: Wound Location: Left Chest Secondary Necrosis (Radiation) Wounding Event: Surgical Injury Etiology: Date Acquired: 01/16/2020 Wound Status: Open Weeks Of Treatment: 2 Comorbid Hypertension, Osteoarthritis, Received Chemotherapy, Clustered Wound: No History: Received Radiation Wound Measurements Length: (cm) 5.2 Width: (cm) 4 Depth: (cm) 1.5 Area: (cm) 16.336 Volume: (cm) 24.504 % Reduction in Area: -156.8% % Reduction in Volume: -250.2% Epithelialization:  None Tunneling: Yes Location 1 Position (o'clock): 3 Maximum Distance: (cm) 3.5 Location 2 Position (o'clock): 2 Maximum Distance: (cm) 5 Location 3 Position (o'clock): 8 Maximum Distance: (cm) 4 Undermining: Yes Starting Position (o'clock): 9 Ending Position (o'clock): 12 Maximum Distance: (cm) 2.5 Wound Description Classification: Full Thickness Without Exposed Support Structures Wound Margin: Well defined, not attached Exudate Amount: Medium Exudate Type: Serosanguineous Exudate Color: red, brown Foul Odor After Cleansing: No Slough/Fibrino Yes Wound Bed Granulation Amount: Medium (34-66%) Exposed Structure Granulation Quality: Red, Pink Fascia Exposed: No Necrotic Amount: Medium (34-66%) Fat Layer (Subcutaneous Tissue) Exposed: Yes Necrotic Quality: Eschar, Adherent Slough Tendon Exposed: No Muscle Exposed: No Joint Exposed: No Bone Exposed: No Treatment Notes Wound #1 (Chest) Wound Laterality: Left Cleanser Peri-Wound Care Topical Primary Dressing Secondary Dressing Secured With Compression Wrap Compression Stockings Add-Ons Electronic Signature(s) Signed: 04/23/2020 4:57:15 PM By: Lorrin Jackson Entered By: Lorrin Jackson on 04/23/2020 08:13:06 -------------------------------------------------------------------------------- Vitals Details Patient Name: Date of Service: [name], [name] 04/23/2020 8:00 A M Medical Record Number: [id number] Patient Account Number: [id number] Date of Birth/Sex: Treating RN: 1970-10-25 (50 y.o. [name] Primary Care Sonnie Bias: [name] Other Clinician: Referring Ronte Parker: Treating Leven Hoel/Extender: [name] in Treatment: 2 Vital Signs Time Taken: 08:05 Temperature (F): 98.3 Height (in): 67 Pulse (bpm): 88 Weight (lbs): 257 Respiratory Rate (breaths/min): 18 Body Mass Index (BMI): 40.2 Blood Pressure (mmHg): 126/85 Reference Range: 80 - 120 mg /  dl Electronic Signature(s) Signed: 04/23/2020 4:57:15 PM By: Lorrin Jackson Entered By: Lorrin Jackson on 04/23/2020 08:07:48

## 2020-04-24 ENCOUNTER — Encounter (HOSPITAL_BASED_OUTPATIENT_CLINIC_OR_DEPARTMENT_OTHER): Payer: Medicaid Other | Admitting: Physician Assistant

## 2020-04-24 ENCOUNTER — Other Ambulatory Visit: Payer: Self-pay

## 2020-04-24 DIAGNOSIS — N641 Fat necrosis of breast: Secondary | ICD-10-CM | POA: Diagnosis not present

## 2020-04-24 NOTE — Progress Notes (Signed)
ENNIFER, HARSTON ([id number]) Visit Report for 04/24/2020 Arrival Information Details Patient Name: Date of Service: [name] 04/24/2020 8:00 A M Medical Record Number: [id number] Patient Account Number: [id number] Date of Birth/Sex: Treating RN: September 24, 1970 (50 y.o. [name], [name] Primary Care Mathea Frieling: [name] Other Clinician: Referring Danie Hannig: Treating Vivianna Piccini/Extender: Randon Goldsmith in Treatment: 2 Visit Information History Since Last Visit Added or deleted any medications: No Patient Arrived: Ambulatory Any new allergies or adverse reactions: No Arrival Time: 08:14 Had a fall or experienced change in No Accompanied By: self activities of daily living that may affect Transfer Assistance: None risk of falls: Patient Identification Verified: Yes Signs or symptoms of abuse/neglect since last visito No Secondary Verification Process Completed: Yes Hospitalized since last visit: No Patient Requires Transmission-Based Precautions: No Implantable device outside of the clinic excluding No Patient Has Alerts: No cellular tissue based products placed in the center since last visit: Has Dressing in Place as Prescribed: Yes Pain Present Now: No Electronic Signature(s) Signed: 04/24/2020 5:55:25 PM By: Deon Pilling Entered By: Deon Pilling on 04/24/2020 10:49:52 -------------------------------------------------------------------------------- Encounter Discharge Information Details Patient Name: Date of Service: [name] 04/24/2020 8:00 A M Medical Record Number: [id number] Patient Account Number: [id number] Date of Birth/Sex: Treating RN: Apr 12, 1970 (50 y.o. [name] Primary Care Airyana Sprunger: [name] Other Clinician: Referring Eldean Nanna: Treating Dinisha Cai/Extender: Randon Goldsmith in Treatment: 2 Encounter Discharge Information Items Discharge Condition: Stable Ambulatory  Status: Ambulatory Discharge Destination: Home Transportation: Private Auto Accompanied By: self Schedule Follow-up Appointment: Yes Clinical Summary of Care: Electronic Signature(s) Signed: 04/24/2020 5:55:25 PM By: Deon Pilling Entered By: Deon Pilling on 04/24/2020 10:51:41 -------------------------------------------------------------------------------- Vitals Details Patient Name: Date of Service: [name] 04/24/2020 8:00 A M Medical Record Number: [id number] Patient Account Number: [id number] Date of Birth/Sex: Treating RN: 11/03/1970 (50 y.o. [name], [name] Primary Care Chaney Maclaren: [name] Other Clinician: Referring Danil Wedge: Treating Gerritt Galentine/Extender: Randon Goldsmith in Treatment: 2 Vital Signs Time Taken: 08:14 Temperature (F): 97.9 Height (in): 67 Pulse (bpm): 96 Weight (lbs): 257 Respiratory Rate (breaths/min): 18 Body Mass Index (BMI): 40.2 Blood Pressure (mmHg): 127/86 Reference Range: 80 - 120 mg / dl Electronic Signature(s) Signed: 04/24/2020 5:55:25 PM By: Deon Pilling Entered By: Deon Pilling on 04/24/2020 10:50:06

## 2020-04-24 NOTE — Progress Notes (Signed)
ELYANA, GRABSKI ([id number]) Visit Report for 04/24/2020 SuperBill Details Patient Name: Date of Service: [name] 04/24/2020 Medical Record Number: [id number] Patient Account Number: [id number] Date of Birth/Sex: Treating RN: 06/11/1970 (50 y.o. 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

## 2020-04-24 NOTE — Progress Notes (Signed)
[name], [name] ([id number]) Visit Report for 04/24/2020 HBO Details Patient Name: Date of Service: [name] [name] [name] 04/24/2020 8:00 Sarah M Medical Record Number: [id number] Patient Account Number: [id number] Date of Birth/Sex: Treating RN: [birth year] (50 y.o. 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 HBO Treatment Details Treatment Number: 7 Patient Type: Outpatient Chamber Type: Monoplace Chamber Serial #: [id number] Treatment Protocol: 2.5 ATA with 90 minutes oxygen, with two 5 minute air breaks Treatment Details Compression Rate Down: 2.0 psi / minute De-Compression Rate Up: 2.0 psi / minute Sarah breaks and breathing ir Compress Tx Pressure periods Decompress Decompress Begins Reached (leave unused spaces Begins Ends blank) Chamber Pressure (ATA 1 2.5 2.5 2.5 2.5 2.5 - - 2.5 1 ) Clock Time (24 hr) 08:18 08:31 09:01 09:06 09:36 09:41 - - 10:11 10:21 Treatment Length: 123 (minutes) Treatment Segments: 4 Vital Signs Capillary Blood Glucose Reference Range: 80 - 120 mg / dl HBO Diabetic Blood Glucose Intervention Range: <131 mg/dl or >249 mg/dl Time Vitals Blood Respiratory Capillary Blood Glucose Pulse Action Type: Pulse: Temperature: Taken: Pressure: Rate: Glucose (mg/dl): Meter #: Oximetry (%) Taken: Pre 08:14 127/86 96 18 97.9 Post 10:23 128/77 77 16 98 Treatment Response Treatment Toleration: Well Treatment Completion Status: Treatment Completed without Adverse Event Electronic Signature(s) Signed: 04/24/2020 11:32:37 AM By: Worthy Keeler PA-C Signed: 04/24/2020 5:55:25 PM By: [name] Entered By: [name]  on 04/24/2020 10:51:13 -------------------------------------------------------------------------------- HBO Safety Checklist Details Patient Name: Date of Service: [name] [name] 04/24/2020 8:00 Sarah M Medical Record Number: [id number] Patient Account Number: [id number] Date of Birth/Sex: Treating RN: [birth year] (50 y.o. [name], [name] Primary Care [name]: [name] Other Clinician: Referring [name]: Treating [name]/Extender: [name] in Treatment: 2 HBO Safety Checklist Items Safety Checklist Consent Form Signed Patient voided / foley secured and emptied When did you last eato this morning Last dose of injectable or oral agent n/Sarah Ostomy pouch emptied and vented if applicable NA All implantable devices assessed, documented and approved NA Intravenous access site secured and place NA Valuables secured Linens and cotton and cotton/polyester blend (less than 51% polyester) Personal oil-based products / skin lotions / body lotions removed NA Wigs or hairpieces removed NA Smoking or tobacco materials removed NA Books / newspapers / magazines / loose paper removed NA Cologne, aftershave, perfume and deodorant removed NA Jewelry removed (may wrap wedding band) Make-up removed NA Hair care products removed NA Battery operated devices (external) removed NA Heating patches and chemical warmers removed NA Titanium eyewear removed Nail polish cured greater than 10 hours Casting material cured greater than 10 hours NA Hearing aids removed NA Loose dentures or partials removed NA Prosthetics have been removed NA Patient demonstrates correct use of air break device (if applicable) Patient concerns have been addressed Patient grounding bracelet on and cord attached to chamber Specifics for Inpatients (complete in addition to above) Medication sheet sent with patient Intravenous medications needed or due during therapy sent  with patient Drainage tubes (e.g. nasogastric tube or chest tube secured and vented) Endotracheal or Tracheotomy tube secured Cuff deflated of air and inflated with saline Airway suctioned Electronic Signature(s) Signed: 04/24/2020 5:55:25 PM By:  [name] Entered By: [name] on 04/24/2020 10:50:34

## 2020-04-25 ENCOUNTER — Encounter (HOSPITAL_BASED_OUTPATIENT_CLINIC_OR_DEPARTMENT_OTHER): Payer: Medicaid Other | Admitting: Internal Medicine

## 2020-04-25 ENCOUNTER — Other Ambulatory Visit: Payer: Self-pay

## 2020-04-25 DIAGNOSIS — N641 Fat necrosis of breast: Secondary | ICD-10-CM | POA: Diagnosis not present

## 2020-04-25 NOTE — Progress Notes (Signed)
[name], [name] ([id number]) Visit Report for 04/25/2020 Arrival Information Details Patient Name: Date of Service: [name] [name] 04/25/2020 8:00 Sarah M Medical Record Number: [id number] Patient Account Number: [id number] Date of Birth/Sex: Treating RN: 12/23/1970 (50 y.o. [name], [name] Primary Care [name]: [name] Other Clinician: Referring [name]: Treating [name]/Extender: [name] in Treatment: 2 Visit Information History Since Last Visit Added or deleted any medications: No Patient Arrived: Ambulatory Any new allergies or adverse reactions: No Arrival Time: 08:15 Had Sarah fall or experienced change in No Accompanied By: alone activities of daily living that may affect Transfer Assistance: None risk of falls: Patient Identification Verified: Yes Signs or symptoms of abuse/neglect since last visito No Secondary Verification Process Completed: Yes Hospitalized since last visit: No Patient Requires Transmission-Based Precautions: No Implantable device outside of the clinic excluding No Patient Has Alerts: No cellular tissue based products placed in the center since last visit: Has Dressing in Place as Prescribed: Yes Pain Present Now: No Electronic Signature(s) Signed: 04/25/2020 5:27:18 PM By: Sarah Hurst RN, BSN Entered By: [name] on 04/25/2020 11:44:30 -------------------------------------------------------------------------------- Encounter Discharge Information Details Patient Name: Date of Service: [name] V [name] 04/25/2020 8:00 Sarah M Medical Record Number: [id number] Patient Account Number: [id number] Date of Birth/Sex: Treating RN: Aug 15, 1970 (50 y.o. [name] Primary Care Amanuel Sinkfield: [name] Other Clinician: Referring [name]: Treating [name]/Extender: [name] in Treatment: 2 Encounter Discharge Information Items Discharge Condition:  Stable Ambulatory Status: Ambulatory Discharge Destination: Home Transportation: Private Auto Accompanied By: alone Schedule Follow-up Appointment: Yes Clinical Summary of Care: Patient Declined Electronic Signature(s) Signed: 04/25/2020 5:27:18 PM By: Sarah Hurst RN, BSN Entered By: [name] on 04/25/2020 11:52:16 -------------------------------------------------------------------------------- Vitals Details Patient Name: Date of Service: [name] V [name] 04/25/2020 8:00 Sarah M Medical Record Number: [id number] Patient Account Number: [id number] Date of Birth/Sex: Treating RN: May 23, 1970 (50 y.o. [name] Primary Care Annibelle Brazie: [name] Other Clinician: Referring Makayela Secrest: Treating Tammee Thielke/Extender: [name] in Treatment: 2 Vital Signs Time Taken: 08:15 Temperature (F): 98.2 Height (in): 67 Pulse (bpm): 98 Weight (lbs): 257 Respiratory Rate (breaths/min): 18 Body Mass Index (BMI): 40.2 Blood Pressure (mmHg): 122/67 Reference Range: 80 - 120 mg / dl Electronic Signature(s) Signed: 04/25/2020 5:27:18 PM By: Sarah Hurst RN, BSN Entered By: [name] on 04/25/2020 11:51:14

## 2020-04-25 NOTE — Progress Notes (Signed)
[name], [name] ([id number]) Visit Report for 04/23/2020 Arrival Information Details Patient Name: Date of Service: [name] [name] 04/23/2020 8:30 [name] Medical Record Number: [id number] Patient Account Number: [id number] Date of Birth/Sex: Treating RN: 1970/01/26 (50 y.o. [name], [name] Primary Care [name]: [name] Other Clinician: Referring [name]: Treating [name]/Extender: [name] in Treatment: 2 Visit Information History Since Last Visit Added or deleted any medications: No Patient Arrived: Ambulatory Any new allergies or adverse reactions: No Arrival Time: 08:05 Had Sarah fall or experienced change in No Accompanied By: alone activities of daily living that may affect Transfer Assistance: None risk of falls: Patient Identification Verified: Yes Signs or symptoms of abuse/neglect since last visito No Secondary Verification Process Completed: Yes Hospitalized since last visit: No Patient Requires Transmission-Based Precautions: No Implantable device outside of the clinic excluding No Patient Has Alerts: No cellular tissue based products placed in the center since last visit: Has Dressing in Place as Prescribed: Yes Pain Present Now: No Electronic Signature(s) Signed: 04/25/2020 5:27:18 PM By: Sarah Hurst RN, BSN Entered By: [name] on 04/23/2020 11:15:41 -------------------------------------------------------------------------------- Encounter Discharge Information Details Patient Name: Date of Service: [name] V [name] 04/23/2020 8:30 [name] Medical Record Number: [id number] Patient Account Number: [id number] Date of Birth/Sex: Treating RN: [birth year] (50 y.o. [name] Primary Care [name]: [name] Other Clinician: Referring [name]: Treating [name]/Extender: [name] in Treatment: 2 Encounter Discharge Information Items Discharge Condition:  Stable Ambulatory Status: Ambulatory Discharge Destination: Home Transportation: Private Auto Accompanied By: alone Schedule Follow-up Appointment: Yes Clinical Summary of Care: Patient Declined Electronic Signature(s) Signed: 04/25/2020 5:27:18 PM By: Sarah Hurst RN, BSN Entered By: [name] on 04/23/2020 11:18:12 -------------------------------------------------------------------------------- Vitals Details Patient Name: Date of Service: [name] V [name] 04/23/2020 8:30 [name] Medical Record Number: [id number] Patient Account Number: [id number] Date of Birth/Sex: Treating RN: Dec 09, 1970 (50 y.o. [name] Primary Care Norvell Caswell: [name] Other Clinician: Referring [name]: Treating [name]/Extender: [name] in Treatment: 2 Vital Signs Time Taken: 08:05 Temperature (F): 98.3 Height (in): 67 Pulse (bpm): 88 Weight (lbs): 257 Respiratory Rate (breaths/min): 18 Body Mass Index (BMI): 40.2 Blood Pressure (mmHg): 126/85 Reference Range: 80 - 120 mg / dl Electronic Signature(s) Signed: 04/25/2020 5:27:18 PM By: Sarah Hurst RN, BSN Entered By: [name] on 04/23/2020 11:15:58

## 2020-04-26 ENCOUNTER — Encounter (HOSPITAL_BASED_OUTPATIENT_CLINIC_OR_DEPARTMENT_OTHER): Payer: Medicaid Other | Admitting: Internal Medicine

## 2020-04-26 ENCOUNTER — Other Ambulatory Visit: Payer: Self-pay

## 2020-04-26 DIAGNOSIS — N641 Fat necrosis of breast: Secondary | ICD-10-CM | POA: Diagnosis not present

## 2020-04-26 NOTE — Progress Notes (Signed)
ZHAVIA, CUNANAN ([id number]) Visit Report for 04/26/2020 Arrival Information Details Patient Name: Date of Service: [name] 04/26/2020 8:00 A M Medical Record Number: [id number] Patient Account Number: [id number] Date of Birth/Sex: Treating RN: 10/20/1970 (50 y.o. [name], [name] Primary Care Dennie Moltz: [name] Other Clinician: Referring Apollonia Amini: Treating Deland Slocumb/Extender: [name] in Treatment: 2 Visit Information History Since Last Visit Added or deleted any medications: No Patient Arrived: Ambulatory Any new allergies or adverse reactions: No Arrival Time: 07:54 Had a fall or experienced change in No Accompanied By: self activities of daily living that may affect Transfer Assistance: None risk of falls: Patient Identification Verified: Yes Signs or symptoms of abuse/neglect since last visito No Secondary Verification Process Completed: Yes Hospitalized since last visit: No Patient Requires Transmission-Based Precautions: No Implantable device outside of the clinic excluding No Patient Has Alerts: No cellular tissue based products placed in the center since last visit: Has Dressing in Place as Prescribed: Yes Pain Present Now: No Electronic Signature(s) Signed: 04/26/2020 5:09:10 PM By: Deon Pilling Entered By: Deon Pilling on 04/26/2020 09:38:49 -------------------------------------------------------------------------------- Encounter Discharge Information Details Patient Name: Date of Service: [name] 04/26/2020 8:00 A M Medical Record Number: [id number] Patient Account Number: [id number] Date of Birth/Sex: Treating RN: 1970-12-01 (50 y.o. [name] Primary Care Ashunti Schofield: [name] Other Clinician: Referring Shalondra Wunschel: Treating Collyns Mcquigg/Extender: [name] in Treatment: 2 Encounter Discharge Information Items Discharge Condition: Stable Ambulatory  Status: Ambulatory Discharge Destination: Home Transportation: Private Auto Accompanied By: self Schedule Follow-up Appointment: Yes Clinical Summary of Care: Electronic Signature(s) Signed: 04/26/2020 5:09:10 PM By: Deon Pilling Entered By: Deon Pilling on 04/26/2020 10:28:03 -------------------------------------------------------------------------------- Vitals Details Patient Name: Date of Service: [name] 04/26/2020 8:00 A M Medical Record Number: [id number] Patient Account Number: [id number] Date of Birth/Sex: Treating RN: Aug 31, 1970 (50 y.o. [name], [name] Primary Care Assata Juncaj: [name] Other Clinician: Referring Tobiah Celestine: Treating Camaryn Lumbert/Extender: [name] in Treatment: 2 Vital Signs Time Taken: 07:54 Temperature (F): 98.1 Height (in): 67 Pulse (bpm): 91 Weight (lbs): 257 Respiratory Rate (breaths/min): 18 Body Mass Index (BMI): 40.2 Blood Pressure (mmHg): 113/91 Reference Range: 80 - 120 mg / dl Electronic Signature(s) Signed: 04/26/2020 5:09:10 PM By: Deon Pilling Entered By: Deon Pilling on 04/26/2020 09:39:07

## 2020-04-29 ENCOUNTER — Other Ambulatory Visit: Payer: Self-pay

## 2020-04-29 ENCOUNTER — Encounter (HOSPITAL_BASED_OUTPATIENT_CLINIC_OR_DEPARTMENT_OTHER): Payer: Medicaid Other | Admitting: Internal Medicine

## 2020-04-29 DIAGNOSIS — N641 Fat necrosis of breast: Secondary | ICD-10-CM | POA: Diagnosis not present

## 2020-04-29 NOTE — Progress Notes (Signed)
[name], [name] ([id number]) Visit Report for 04/29/2020 Arrival Information Details Patient Name: Date of Service: [name] 04/29/2020 8:00 A M Medical Record Number: [id number] Patient Account Number: [id number] Date of Birth/Sex: Treating RN: 03/11/1970 (50 y.o. [name], [name] Primary Care Vali Capano: [name] Other Clinician: Referring Makenzi Bannister: Treating Yadir Zentner/Extender: [name] in Treatment: 2 Visit Information History Since Last Visit Added or deleted any medications: No Patient Arrived: Ambulatory Any new allergies or adverse reactions: No Arrival Time: 07:57 Had a fall or experienced change in No Accompanied By: self activities of daily living that may affect Transfer Assistance: None risk of falls: Patient Identification Verified: Yes Signs or symptoms of abuse/neglect since last visito No Secondary Verification Process Completed: Yes Hospitalized since last visit: No Patient Requires Transmission-Based Precautions: No Implantable device outside of the clinic excluding No Patient Has Alerts: No cellular tissue based products placed in the center since last visit: Has Dressing in Place as Prescribed: Yes Pain Present Now: No Electronic Signature(s) Signed: 04/29/2020 4:05:30 PM By: Deon Pilling Entered By: Deon Pilling on 04/29/2020 10:09:00 -------------------------------------------------------------------------------- Encounter Discharge Information Details Patient Name: Date of Service: Sarah Crochet V [name] 04/29/2020 8:00 A M Medical Record Number: [id number] Patient Account Number: [id number] Date of Birth/Sex: Treating RN: 1970-04-05 (50 y.o. [name] Primary Care Olie Dibert: [name] Other Clinician: Referring Satya Bohall: Treating Deaira Leckey/Extender: [name] in Treatment: 2 Encounter Discharge Information Items Discharge Condition: Stable Ambulatory  Status: Ambulatory Discharge Destination: Home Transportation: Private Auto Accompanied By: self Schedule Follow-up Appointment: Yes Clinical Summary of Care: Electronic Signature(s) Signed: 04/29/2020 4:05:30 PM By: Deon Pilling Entered By: Deon Pilling on 04/29/2020 10:27:24 -------------------------------------------------------------------------------- Vitals Details Patient Name: Date of Service: Sarah Crochet V [name] 04/29/2020 8:00 A M Medical Record Number: [id number] Patient Account Number: [id number] Date of Birth/Sex: Treating RN: 1970/06/05 (50 y.o. [name], [name] Primary Care Kahmari Herard: [name] Other Clinician: Referring Jameire Kouba: Treating Mccauley Diehl/Extender: [name] in Treatment: 2 Vital Signs Time Taken: 07:57 Temperature (F): 97.9 Height (in): 67 Pulse (bpm): 90 Weight (lbs): 257 Respiratory Rate (breaths/min): 18 Body Mass Index (BMI): 40.2 Blood Pressure (mmHg): 131/80 Reference Range: 80 - 120 mg / dl Electronic Signature(s) Signed: 04/29/2020 4:05:30 PM By: Deon Pilling Entered By: Deon Pilling on 04/29/2020 10:09:15

## 2020-04-30 ENCOUNTER — Other Ambulatory Visit: Payer: Self-pay

## 2020-04-30 ENCOUNTER — Encounter (HOSPITAL_BASED_OUTPATIENT_CLINIC_OR_DEPARTMENT_OTHER): Payer: Medicaid Other | Admitting: Internal Medicine

## 2020-04-30 DIAGNOSIS — N641 Fat necrosis of breast: Secondary | ICD-10-CM | POA: Diagnosis not present

## 2020-04-30 NOTE — Progress Notes (Signed)
Sarah, Khan ([id number]) Visit Report for 04/30/2020 Arrival Information Details Patient Name: Date of Service: [name] [name] 04/30/2020 7:30 [name] Medical Record Number: [id number] Patient Account Number: [id number] Date of Birth/Sex: Treating RN: 1970/03/23 (50 y.o. [name] Primary Care Makar Slatter: [name] Other Clinician: Referring Dyneisha Murchison: Treating Jessey Stehlin/Extender: [name] in Treatment: 3 Visit Information History Since Last Visit Added or deleted any medications: No Patient Arrived: Ambulatory Any new allergies or adverse reactions: No Arrival Time: 07:45 Had Sarah fall or experienced change in No Transfer Assistance: None activities of daily living that may affect Patient Identification Verified: Yes risk of falls: Secondary Verification Process Completed: Yes Signs or symptoms of abuse/neglect since last visito No Patient Requires Transmission-Based Precautions: No Hospitalized since last visit: No Patient Has Alerts: No Implantable device outside of the clinic excluding No cellular tissue based products placed in the center since last visit: Has Dressing in Place as Prescribed: Yes Pain Present Now: No Electronic Signature(s) Signed: 04/30/2020 5:25:58 PM By: Lorrin Jackson Entered By: Lorrin Jackson on 04/30/2020 07:45:43 -------------------------------------------------------------------------------- Clinic Level of Care Assessment Details Patient Name: Date of Service: [name] [name] 04/30/2020 7:30 [name] Medical Record Number: [id number] Patient Account Number: [id number] Date of Birth/Sex: Treating RN: 1970-05-13 (50 y.o. [name] Primary Care Sharnise Blough: [name] Other Clinician: Referring Jaquille Kau: Treating Jimmye Wisnieski/Extender: [name] in Treatment: 3 Clinic Level of Care Assessment Items TOOL 4 Quantity Score X- 1 0 Use when only an EandM is  performed on FOLLOW-UP visit ASSESSMENTS - Nursing Assessment / Reassessment X- 1 10 Reassessment of Co-morbidities (includes updates in patient status) X- 1 5 Reassessment of Adherence to Treatment Plan ASSESSMENTS - Wound and Skin Sarah ssessment / Reassessment []  - 0 Simple Wound Assessment / Reassessment - one wound X- 1 5 Complex Wound Assessment / Reassessment - multiple wounds []  - 0 Dermatologic / Skin Assessment (not related to wound area) ASSESSMENTS - Focused Assessment []  - 0 Circumferential Edema Measurements - multi extremities []  - 0 Nutritional Assessment / Counseling / Intervention []  - 0 Lower Extremity Assessment (monofilament, tuning fork, pulses) []  - 0 Peripheral Arterial Disease Assessment (using hand held doppler) ASSESSMENTS - Ostomy and/or Continence Assessment and Care []  - 0 Incontinence Assessment and Management []  - 0 Ostomy Care Assessment and Management (repouching, etc.) PROCESS - Coordination of Care []  - 0 Simple Patient / Family Education for ongoing care X- 1 20 Complex (extensive) Patient / Family Education for ongoing care []  - 0 Staff obtains Programmer, systems, Records, T Results / Process Orders est []  - 0 Staff telephones HHA, Nursing Homes / Clarify orders / etc []  - 0 Routine Transfer to another Facility (non-emergent condition) []  - 0 Routine Hospital Admission (non-emergent condition) []  - 0 New Admissions / Biomedical engineer / Ordering NPWT Apligraf, etc. , []  - 0 Emergency Hospital Admission (emergent condition) []  - 0 Simple Discharge Coordination []  - 0 Complex (extensive) Discharge Coordination PROCESS - Special Needs []  - 0 Pediatric / Minor Patient Management []  - 0 Isolation Patient Management []  - 0 Hearing / Language / Visual special needs []  - 0 Assessment of Community assistance (transportation, D/C planning, etc.) []  - 0 Additional assistance / Altered mentation []  - 0 Support Surface(s) Assessment  (bed, cushion, seat, etc.) INTERVENTIONS - Wound Cleansing / Measurement []  - 0 Simple Wound Cleansing - one wound X- 1 5 Complex Wound Cleansing -  multiple wounds []  - 0 Wound Imaging (photographs - any number of wounds) []  - 0 Wound Tracing (instead of photographs) []  - 0 Simple Wound Measurement - one wound X- 1 5 Complex Wound Measurement - multiple wounds INTERVENTIONS - Wound Dressings []  - 0 Small Wound Dressing one or multiple wounds X- 1 15 Medium Wound Dressing one or multiple wounds []  - 0 Large Wound Dressing one or multiple wounds []  - 0 Application of Medications - topical []  - 0 Application of Medications - injection INTERVENTIONS - Miscellaneous []  - 0 External ear exam []  - 0 Specimen Collection (cultures, biopsies, blood, body fluids, etc.) []  - 0 Specimen(s) / Culture(s) sent or taken to Lab for analysis []  - 0 Patient Transfer (multiple staff / Civil Service fast streamer / Similar devices) []  - 0 Simple Staple / Suture removal (25 or less) []  - 0 Complex Staple / Suture removal (26 or more) []  - 0 Hypo / Hyperglycemic Management (close monitor of Blood Glucose) []  - 0 Ankle / Brachial Index (ABI) - do not check if billed separately []  - 0 Vital Signs Has the patient been seen at the hospital within the last three years: Yes Total Score: 65 Level Of Care: New/Established - Level 2 Electronic Signature(s) Signed: 04/30/2020 5:25:58 PM By: Lorrin Jackson Entered By: Lorrin Jackson on 04/30/2020 08:03:44 -------------------------------------------------------------------------------- Encounter Discharge Information Details Patient Name: Date of Service: [name] [name] 04/30/2020 7:30 [name] Medical Record Number: [id number] Patient Account Number: [id number] Date of Birth/Sex: Treating RN: 05-Feb-1970 (50 y.o. [name] Primary Care Abree Romick: [name] Other Clinician: Referring Adira Limburg: Treating Axton Cihlar/Extender: [name] in Treatment: 3 Encounter Discharge Information Items Discharge Condition: Stable Ambulatory Status: Ambulatory Discharge Destination: Home Transportation: Private Auto Schedule Follow-up Appointment: Yes Clinical Summary of Care: Provided on 04/30/2020 Form Type Recipient Paper Patient Patient Electronic Signature(s) Signed: 04/30/2020 5:25:58 PM By: Lorrin Jackson Entered By: Lorrin Jackson on 04/30/2020 08:03:00 -------------------------------------------------------------------------------- Patient/Caregiver Education Details Patient Name: Date of Service: [name] 4/26/2022andnbsp7:30 [name] Medical Record Number: [id number] Patient Account Number: [id number] Date of Birth/Gender: Treating RN: March 28, 1970 (50 y.o. [name] Primary Care Physician: [name] Other Clinician: Referring Physician: Treating Physician/Extender: [name] in Treatment: 3 Education Assessment Education Provided To: Patient Education Topics Provided Wound/Skin Impairment: Methods: Demonstration, Explain/Verbal Responses: State content correctly Electronic Signature(s) Signed: 04/30/2020 5:25:58 PM By: Lorrin Jackson Entered By: Lorrin Jackson on 04/30/2020 08:02:34 -------------------------------------------------------------------------------- Wound Assessment Details Patient Name: Date of Service: [name] [name] 04/30/2020 7:30 [name] Medical Record Number: [id number] Patient Account Number: [id number] Date of Birth/Sex: Treating RN: December 15, 1970 (50 y.o. [name] Primary Care Roi Jafari: [name] Other Clinician: Referring Alekxander Isola: Treating Taje Littler/Extender: [name] in Treatment: 3 Wound Status Wound Number: 1 Primary Open Surgical Wound Etiology: Wound Location: Left Chest Secondary Necrosis (Radiation) Wounding Event: Surgical Injury Etiology: Date Acquired:  01/16/2020 Wound Status: Open Weeks Of Treatment: 3 Comorbid Hypertension, Osteoarthritis, Received Chemotherapy, Clustered Wound: No History: Received Radiation Wound Measurements Length: (cm) 5 Width: (cm) 4.2 Depth: (cm) 0.8 Area: (cm) 16.493 Volume: (cm) 13.195 % Reduction in Area: -159.2% % Reduction in Volume: -88.6% Epithelialization: None Tunneling: Yes Location 1 Position (o'clock): 10 Maximum Distance: (cm) 1 Location 2 Position (o'clock): 2 Maximum Distance: (cm) 3 Location 3 Position (o'clock): 9 Maximum Distance: (cm) 3.2 Wound Description Classification: Full Thickness Without Exposed Support  Structures Wound Margin: Well defined, not attached Exudate Amount: Medium Exudate Type: Serosanguineous Exudate Color: red, brown Foul Odor After Cleansing: No Slough/Fibrino Yes Wound Bed Granulation Amount: Medium (34-66%) Exposed Structure Granulation Quality: Red, Pink Fascia Exposed: No Necrotic Amount: Medium (34-66%) Fat Layer (Subcutaneous Tissue) Exposed: Yes Necrotic Quality: Adherent Slough Tendon Exposed: No Muscle Exposed: No Joint Exposed: No Bone Exposed: No Treatment Notes Wound #1 (Chest) Wound Laterality: Left Cleanser Normal Saline Discharge Instruction: Cleanse the wound with Normal Saline prior to applying Sarah clean dressing using gauze sponges, not tissue or cotton balls. Peri-Wound Care Topical Primary Dressing KerraCel Ag Gelling Fiber Dressing, 4x5 in (silver alginate) Discharge Instruction: Ensure to pack into tunnels and undermining. Apply silver alginate to wound bed as instructed Secondary Dressing Zetuvit Plus 4x4 in Discharge Instruction: Apply over primary dressing as directed. Secured With Compression Wrap Compression Stockings Environmental education officer) Signed: 04/30/2020 5:25:58 PM By: Lorrin Jackson Entered By: Lorrin Jackson on 04/30/2020  08:01:52 -------------------------------------------------------------------------------- Vitals Details Patient Name: Date of Service: [name] [name] 04/30/2020 7:30 [name] Medical Record Number: [id number] Patient Account Number: [id number] Date of Birth/Sex: Treating RN: 1970-10-27 (50 y.o. [name] Primary Care Odessa Nishi: [name] Other Clinician: Referring Daelyn Mozer: Treating Annaly Skop/Extender: [name] in Treatment: 3 Vital Signs Time Taken: 07:47 Temperature (F): 98.6 Height (in): 67 Pulse (bpm): 92 Weight (lbs): 257 Respiratory Rate (breaths/min): 16 Body Mass Index (BMI): 40.2 Blood Pressure (mmHg): 127/87 Reference Range: 80 - 120 mg / dl Electronic Signature(s) Signed: 04/30/2020 5:25:58 PM By: Lorrin Jackson Entered By: Lorrin Jackson on 04/30/2020 07:48:35

## 2020-04-30 NOTE — Progress Notes (Signed)
FLORNCE, RECORD ([id number]) Visit Report for 04/30/2020 Arrival Information Details Patient Name: Date of Service: [name] 04/30/2020 8:00 A M Medical Record Number: [id number] Patient Account Number: [id number] Date of Birth/Sex: Treating RN: February 08, 1970 (50 y.o. [name] Primary Care Grady Mohabir: [name] Other Clinician: Referring Romuald Mccaslin: Treating Michoel Kunin/Extender: [name] in Treatment: 3 Visit Information History Since Last Visit Added or deleted any medications: No Patient Arrived: Ambulatory Any new allergies or adverse reactions: No Arrival Time: 07:47 Had a fall or experienced change in No Accompanied By: self activities of daily living that may affect Transfer Assistance: None risk of falls: Patient Identification Verified: Yes Signs or symptoms of abuse/neglect since last visito No Secondary Verification Process Completed: Yes Hospitalized since last visit: No Patient Requires Transmission-Based Precautions: No Implantable device outside of the clinic excluding No Patient Has Alerts: No cellular tissue based products placed in the center since last visit: Has Dressing in Place as Prescribed: Yes Pain Present Now: No Notes patient from nurse visit to hyberbaric treatment now. Electronic Signature(s) Signed: 04/30/2020 6:08:37 PM By: Deon Pilling Entered By: Deon Pilling on 04/30/2020 09:20:27 -------------------------------------------------------------------------------- Encounter Discharge Information Details Patient Name: Date of Service: [name] 04/30/2020 8:00 A M Medical Record Number: [id number] Patient Account Number: [id number] Date of Birth/Sex: Treating RN: February 26, 1970 (50 y.o. [name] Primary Care Armelia Penton: [name] Other Clinician: Referring Bhavin Monjaraz: Treating Nyala Kirchner/Extender: [name] in Treatment: 3 Encounter Discharge  Information Items Discharge Condition: Stable Ambulatory Status: Ambulatory Discharge Destination: Home Transportation: Private Auto Accompanied By: self Schedule Follow-up Appointment: Yes Clinical Summary of Care: Electronic Signature(s) Signed: 04/30/2020 6:08:37 PM By: Deon Pilling Entered By: Deon Pilling on 04/30/2020 10:42:21 -------------------------------------------------------------------------------- Vitals Details Patient Name: Date of Service: [name] 04/30/2020 8:00 A M Medical Record Number: [id number] Patient Account Number: [id number] Date of Birth/Sex: Treating RN: 01-Aug-1970 (50 y.o. [name], [name] Primary Care Blayke Cordrey: [name] Other Clinician: Referring Ashana Tullo: Treating Clairissa Valvano/Extender: [name] in Treatment: 3 Vital Signs Time Taken: 07:47 Temperature (F): 98.6 Height (in): 67 Pulse (bpm): 92 Weight (lbs): 257 Respiratory Rate (breaths/min): 16 Body Mass Index (BMI): 40.2 Blood Pressure (mmHg): 127/87 Reference Range: 80 - 120 mg / dl Electronic Signature(s) Signed: 04/30/2020 6:08:37 PM By: Deon Pilling Entered By: Deon Pilling on 04/30/2020 09:20:38

## 2020-05-01 ENCOUNTER — Other Ambulatory Visit: Payer: Self-pay

## 2020-05-01 ENCOUNTER — Encounter (HOSPITAL_BASED_OUTPATIENT_CLINIC_OR_DEPARTMENT_OTHER): Payer: Medicaid Other | Admitting: Internal Medicine

## 2020-05-01 DIAGNOSIS — N641 Fat necrosis of breast: Secondary | ICD-10-CM | POA: Diagnosis not present

## 2020-05-01 NOTE — Progress Notes (Signed)
TYSHEENA, GINZBURG ([id number]) Visit Report for 05/01/2020 Arrival Information Details Patient Name: Date of Service: [name] 05/01/2020 8:00 A M Medical Record Number: [id number] Patient Account Number: [id number] Date of Birth/Sex: Treating RN: 10/20/1970 (50 y.o. [name], [name] Primary Care Jhaden Pizzuto: [name] Other Clinician: Referring Kayln Garceau: Treating Krysti Hickling/Extender: [name] in Treatment: 3 Visit Information History Since Last Visit Added or deleted any medications: No Patient Arrived: Ambulatory Any new allergies or adverse reactions: No Arrival Time: 08:45 Had a fall or experienced change in No Accompanied By: self activities of daily living that may affect Transfer Assistance: None risk of falls: Patient Identification Verified: Yes Signs or symptoms of abuse/neglect since last visito No Secondary Verification Process Completed: Yes Hospitalized since last visit: No Patient Requires Transmission-Based Precautions: No Implantable device outside of the clinic excluding No Patient Has Alerts: No cellular tissue based products placed in the center since last visit: Has Dressing in Place as Prescribed: Yes Pain Present Now: No Electronic Signature(s) Signed: 05/01/2020 5:19:23 PM By: Deon Pilling Entered By: Deon Pilling on 05/01/2020 08:52:34 -------------------------------------------------------------------------------- Encounter Discharge Information Details Patient Name: Date of Service: [name] 05/01/2020 8:00 A M Medical Record Number: [id number] Patient Account Number: [id number] Date of Birth/Sex: Treating RN: 1970/10/18 (50 y.o. [name] Primary Care Gerilynn Mccullars: [name] Other Clinician: Referring Shawan Corella: Treating Rizwan Kuyper/Extender: [name] in Treatment: 3 Encounter Discharge Information Items Discharge Condition: Stable Ambulatory  Status: Ambulatory Discharge Destination: Home Transportation: Private Auto Accompanied By: self Schedule Follow-up Appointment: Yes Clinical Summary of Care: Electronic Signature(s) Signed: 05/01/2020 5:19:23 PM By: Deon Pilling Entered By: Deon Pilling on 05/01/2020 11:01:13 -------------------------------------------------------------------------------- Vitals Details Patient Name: Date of Service: [name] 05/01/2020 8:00 A M Medical Record Number: [id number] Patient Account Number: [id number] Date of Birth/Sex: Treating RN: 04-Sep-1970 (50 y.o. [name], [name] Primary Care Kelin Nixon: [name] Other Clinician: Referring Romey Mathieson: Treating Rakiya Krawczyk/Extender: [name] in Treatment: 3 Vital Signs Time Taken: 08:45 Temperature (F): 97.7 Height (in): 67 Pulse (bpm): 105 Weight (lbs): 257 Respiratory Rate (breaths/min): 20 Body Mass Index (BMI): 40.2 Blood Pressure (mmHg): 146/80 Reference Range: 80 - 120 mg / dl Electronic Signature(s) Signed: 05/01/2020 5:19:23 PM By: Deon Pilling Entered By: Deon Pilling on 05/01/2020 08:52:45

## 2020-05-02 ENCOUNTER — Other Ambulatory Visit: Payer: Self-pay

## 2020-05-02 ENCOUNTER — Encounter (HOSPITAL_BASED_OUTPATIENT_CLINIC_OR_DEPARTMENT_OTHER): Payer: Medicaid Other | Admitting: Internal Medicine

## 2020-05-02 DIAGNOSIS — N641 Fat necrosis of breast: Secondary | ICD-10-CM | POA: Diagnosis not present

## 2020-05-03 ENCOUNTER — Other Ambulatory Visit: Payer: Self-pay

## 2020-05-03 ENCOUNTER — Encounter (HOSPITAL_BASED_OUTPATIENT_CLINIC_OR_DEPARTMENT_OTHER): Payer: Medicaid Other | Admitting: Internal Medicine

## 2020-05-03 DIAGNOSIS — L59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: Secondary | ICD-10-CM | POA: Diagnosis not present

## 2020-05-03 DIAGNOSIS — N641 Fat necrosis of breast: Secondary | ICD-10-CM | POA: Diagnosis not present

## 2020-05-03 DIAGNOSIS — D0512 Intraductal carcinoma in situ of left breast: Secondary | ICD-10-CM | POA: Diagnosis not present

## 2020-05-06 ENCOUNTER — Other Ambulatory Visit: Payer: Self-pay

## 2020-05-06 ENCOUNTER — Encounter (HOSPITAL_BASED_OUTPATIENT_CLINIC_OR_DEPARTMENT_OTHER): Payer: Medicaid Other | Admitting: Internal Medicine

## 2020-05-06 DIAGNOSIS — L59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: Secondary | ICD-10-CM | POA: Insufficient documentation

## 2020-05-06 DIAGNOSIS — Z9221 Personal history of antineoplastic chemotherapy: Secondary | ICD-10-CM | POA: Diagnosis not present

## 2020-05-06 DIAGNOSIS — D0512 Intraductal carcinoma in situ of left breast: Secondary | ICD-10-CM | POA: Insufficient documentation

## 2020-05-06 DIAGNOSIS — Y842 Radiological procedure and radiotherapy as the cause of abnormal reaction of the patient, or of later complication, without mention of misadventure at the time of the procedure: Secondary | ICD-10-CM | POA: Insufficient documentation

## 2020-05-06 DIAGNOSIS — Z79811 Long term (current) use of aromatase inhibitors: Secondary | ICD-10-CM | POA: Diagnosis not present

## 2020-05-06 DIAGNOSIS — Z923 Personal history of irradiation: Secondary | ICD-10-CM | POA: Insufficient documentation

## 2020-05-06 DIAGNOSIS — Z171 Estrogen receptor negative status [ER-]: Secondary | ICD-10-CM | POA: Diagnosis not present

## 2020-05-06 DIAGNOSIS — L98499 Non-pressure chronic ulcer of skin of other sites with unspecified severity: Secondary | ICD-10-CM | POA: Insufficient documentation

## 2020-05-06 DIAGNOSIS — Z9013 Acquired absence of bilateral breasts and nipples: Secondary | ICD-10-CM | POA: Insufficient documentation

## 2020-05-06 DIAGNOSIS — Z148 Genetic carrier of other disease: Secondary | ICD-10-CM | POA: Diagnosis not present

## 2020-05-06 DIAGNOSIS — C50412 Malignant neoplasm of upper-outer quadrant of left female breast: Secondary | ICD-10-CM | POA: Diagnosis present

## 2020-05-06 DIAGNOSIS — T8131XS Disruption of external operation (surgical) wound, not elsewhere classified, sequela: Secondary | ICD-10-CM | POA: Diagnosis not present

## 2020-05-06 DIAGNOSIS — T8131XA Disruption of external operation (surgical) wound, not elsewhere classified, initial encounter: Secondary | ICD-10-CM | POA: Insufficient documentation

## 2020-05-06 NOTE — Progress Notes (Signed)
[name], [name] ([id number]) Visit Report for 05/02/2020 Arrival Information Details Patient Name: Date of Service: Sarah [name] [name] 05/02/2020 8:00 Sarah M Medical Record Number: [id number] Patient Account Number: [id number] Date of Birth/Sex: Treating RN: 1970-09-24 (50 y.o. [name], [name] Primary Care Arth Nicastro: [name] Other Clinician: Referring Rivaan Kendall: Treating Klye Besecker/Extender: [name] in Treatment: 3 Visit Information History Since Last Visit Added or deleted any medications: No Patient Arrived: Ambulatory Any new allergies or adverse reactions: No Arrival Time: 08:07 Had Sarah fall or experienced change in No Accompanied By: alone activities of daily living that may affect Transfer Assistance: None risk of falls: Patient Identification Verified: Yes Signs or symptoms of abuse/neglect since last visito No Secondary Verification Process Completed: Yes Hospitalized since last visit: No Patient Requires Transmission-Based Precautions: No Implantable device outside of the clinic excluding No Patient Has Alerts: No cellular tissue based products placed in the center since last visit: Has Dressing in Place as Prescribed: Yes Pain Present Now: No Electronic Signature(s) Signed: 05/06/2020 5:39:21 PM By: Levan Hurst RN, BSN Entered By: Levan Hurst on 05/02/2020 10:48:25 -------------------------------------------------------------------------------- Encounter Discharge Information Details Patient Name: Date of Service: Sarah Crochet V [name] 05/02/2020 8:00 Sarah M Medical Record Number: [id number] Patient Account Number: [id number] Date of Birth/Sex: Treating RN: 04-Jun-1970 (50 y.o. [name] Primary Care Shlonda Dolloff: [name] Other Clinician: Referring Obadiah Dennard: Treating Donyea Gafford/Extender: [name] in Treatment: 3 Encounter Discharge Information Items Discharge Condition:  Stable Ambulatory Status: Ambulatory Discharge Destination: Home Transportation: Private Auto Accompanied By: alone Schedule Follow-up Appointment: Yes Clinical Summary of Care: Patient Declined Electronic Signature(s) Signed: 05/06/2020 5:39:21 PM By: Levan Hurst RN, BSN Entered By: Levan Hurst on 05/02/2020 10:51:11 -------------------------------------------------------------------------------- Vitals Details Patient Name: Date of Service: Sarah Crochet V [name] 05/02/2020 8:00 Sarah M Medical Record Number: [id number] Patient Account Number: [id number] Date of Birth/Sex: Treating RN: 1970/04/24 (50 y.o. [name] Primary Care Heaven Meeker: [name] Other Clinician: Referring Daril Warga: Treating Godfrey Tritschler/Extender: [name] in Treatment: 3 Vital Signs Time Taken: 08:07 Temperature (F): 98.1 Height (in): 67 Pulse (bpm): 89 Weight (lbs): 257 Respiratory Rate (breaths/min): 18 Body Mass Index (BMI): 40.2 Blood Pressure (mmHg): 120/82 Reference Range: 80 - 120 mg / dl Electronic Signature(s) Signed: 05/06/2020 5:39:21 PM By: Levan Hurst RN, BSN Entered By: Levan Hurst on 05/02/2020 10:48:42

## 2020-05-06 NOTE — Progress Notes (Signed)
TANAKA, GILLEN ([id number]) Visit Report for 05/06/2020 HBO Details Patient Name: Date of Service: [name] V [name] 05/06/2020 8:00 A M Medical Record Number: [id number] Patient Account Number: [id number] Date of Birth/Sex: Treating RN: 09-30-1970 (51 y.o. 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 HBO Treatment Details Treatment Number: Treatment Aborted/Not Restarted: Patient Choice Patient Type: Outpatient Chamber Type: Monoplace Chamber Serial #: [id number] Treatment Protocol: 2.5 ATA with 90 minutes oxygen, with two 5 minute air breaks Treatment Details Compression Rate Down: 2.0 psi / minute De-Compression Rate Up: 2.0 psi / minute A breaks and ir breathing Compress Tx Pressure Decompress Decompress periods Begins Reached Begins Ends (leave unused spaces blank) Chamber Pressure (ATA 1 2.5 2.5 2.5 2.5 2.5 - - 2.5 1 ) Clock Time (24 hr) 08:05 - - - - - - - 08:13 08:20 Treatment Length: 15 (minutes) Treatment Segments: 0 Vital Signs Capillary Blood Glucose Reference Range: 80 - 120 mg / dl HBO Diabetic Blood Glucose Intervention Range: <131 mg/dl or >249 mg/dl Time Vitals Blood Respiratory Capillary Blood Glucose Pulse Action Type: Pulse: Temperature: Taken: Pressure: Rate: Glucose (mg/dl): Meter #: Oximetry (%) Taken: Pre 07:58 140/84 87 18 97.9 Post 08:28 140/80 88 18 98.2 Treatment Response Treatment Toleration: Well Treatment Completion Status: Treatment Aborted/Not Restarted Reason: Patient Choice Treatment Notes Patient reports during travel at 0813 need to use the restroom, feels as if she need to have a  bowel movement. At [place] stopped travel and ascent. 0820 pulled patient out of chamber. patient to restroom. 0828 patient would like to wait to see if her stomach start to react again before proceeding with treatment today. At [place] Patient decided to not try again today in fear of needing to return to bathroom. MD made aware. Elajah Kunsman Notes Patient terminated treatment ischial early because of GI issues. I did not see her because of this Electronic Signature(s) Signed: 05/06/2020 5:25:01 PM By: [name] MD Entered By: [name] on 05/06/2020 17:05:38 -------------------------------------------------------------------------------- HBO Safety Checklist Details Patient Name: Date of Service: [name] V [name] 05/06/2020 8:00 A M Medical Record Number: [id number] Patient Account Number: [id number] Date of Birth/Sex: Treating RN: 06/12/1970 (50 y.o. [name], [name] Primary Care Dee Paden: [name] Other Clinician: Referring Armonii Sieh: Treating Payeton Germani/Extender: [name] in Treatment: 3 HBO Safety Checklist Items Safety Checklist Consent Form Signed Patient voided / foley secured and emptied When did you last eato last night Last dose of injectable or oral agent n/a Ostomy pouch emptied and vented if applicable NA All implantable devices assessed, documented and approved NA Intravenous access site secured and place NA Valuables secured Linens and cotton and cotton/polyester blend (less than 51% polyester) Personal oil-based products / skin lotions / body lotions removed NA Wigs or hairpieces removed NA Smoking or tobacco materials removed NA Books / newspapers / magazines / loose paper removed NA Cologne, aftershave, perfume and deodorant removed NA Jewelry removed (may wrap wedding band) Make-up removed NA Hair care products removed NA Battery operated devices (external) removed NA Heating patches and chemical warmers  removed NA Titanium eyewear removed Nail polish cured greater than 10 hours Casting material cured greater than 10 hours NA Hearing aids removed  NA Loose dentures or partials removed NA Prosthetics have been removed NA Patient demonstrates correct use of air break device (if applicable) Patient concerns have been addressed Patient grounding bracelet on and cord attached to chamber Specifics for Inpatients (complete in addition to above) Medication sheet sent with patient Intravenous medications needed or due during therapy sent with patient Drainage tubes (e.g. nasogastric tube or chest tube secured and vented) Endotracheal or Tracheotomy tube secured Cuff deflated of air and inflated with saline Airway suctioned Electronic Signature(s) Signed: 05/06/2020 5:27:54 PM By: Deon Pilling Entered By: Deon Pilling on 05/06/2020 09:30:12

## 2020-05-06 NOTE — Progress Notes (Signed)
[name], [name] ([id number]) Visit Report for 05/06/2020 Arrival Information Details Patient Name: Date of Service: [name] 05/06/2020 8:00 A M Medical Record Number: [id number] Patient Account Number: [id number] Date of Birth/Sex: Treating RN: 03-26-1970 (50 y.o. [name], [name] Primary Care Raedyn Klinck: [name] Other Clinician: Referring Krissia Schreier: Treating Roshelle Traub/Extender: [name] in Treatment: 3 Visit Information History Since Last Visit Added or deleted any medications: No Patient Arrived: Ambulatory Any new allergies or adverse reactions: No Arrival Time: 07:58 Had a fall or experienced change in No Accompanied By: self activities of daily living that may affect Transfer Assistance: None risk of falls: Patient Identification Verified: Yes Signs or symptoms of abuse/neglect since last visito No Secondary Verification Process Completed: Yes Hospitalized since last visit: No Patient Requires Transmission-Based Precautions: No Implantable device outside of the clinic excluding No Patient Has Alerts: No cellular tissue based products placed in the center since last visit: Has Dressing in Place as Prescribed: Yes Pain Present Now: No Electronic Signature(s) Signed: 05/06/2020 5:27:54 PM By: Deon Pilling Entered By: Deon Pilling on 05/06/2020 09:29:27 -------------------------------------------------------------------------------- Clinic Level of Care Assessment Details Patient Name: Date of Service: [name], [name] 05/06/2020 8:00 A M Medical Record Number: [id number] Patient Account Number: [id number] Date of Birth/Sex: Treating RN: February 08, 1970 (50 y.o. [name], [name] Primary Care [name]: [name] Other Clinician: Referring [name]: Treating [name]/Extender: [name] in Treatment: 3 Clinic Level of Care Assessment Items TOOL 4 Quantity Score X- 1 0 Use when only an  EandM is performed on FOLLOW-UP visit ASSESSMENTS - Nursing Assessment / Reassessment []  - 0 Reassessment of Co-morbidities (includes updates in patient status) []  - 0 Reassessment of Adherence to Treatment Plan ASSESSMENTS - Wound and Skin A ssessment / Reassessment []  - 0 Simple Wound Assessment / Reassessment - one wound []  - 0 Complex Wound Assessment / Reassessment - multiple wounds []  - 0 Dermatologic / Skin Assessment (not related to wound area) ASSESSMENTS - Focused Assessment []  - 0 Circumferential Edema Measurements - multi extremities []  - 0 Nutritional Assessment / Counseling / Intervention []  - 0 Lower Extremity Assessment (monofilament, tuning fork, pulses) []  - 0 Peripheral Arterial Disease Assessment (using hand held doppler) ASSESSMENTS - Ostomy and/or Continence Assessment and Care []  - 0 Incontinence Assessment and Management []  - 0 Ostomy Care Assessment and Management (repouching, etc.) PROCESS - Coordination of Care X - Simple Patient / Family Education for ongoing care 1 15 []  - 0 Complex (extensive) Patient / Family Education for ongoing care []  - 0 Staff obtains Programmer, systems, Records, T Results / Process Orders est []  - 0 Staff telephones HHA, Nursing Homes / Clarify orders / etc []  - 0 Routine Transfer to another Facility (non-emergent condition) []  - 0 Routine Hospital Admission (non-emergent condition) []  - 0 New Admissions / Biomedical engineer / Ordering NPWT Apligraf, etc. , []  - 0 Emergency Hospital Admission (emergent condition) X- 1 10 Simple Discharge Coordination []  - 0 Complex (extensive) Discharge Coordination PROCESS - Special Needs []  - 0 Pediatric / Minor Patient Management []  - 0 Isolation Patient Management []  - 0 Hearing / Language / Visual special needs []  - 0 Assessment of Community assistance (transportation, D/C planning, etc.) []  - 0 Additional assistance / Altered mentation []  - 0 Support Surface(s)  Assessment (bed, cushion, seat, etc.) INTERVENTIONS - Wound Cleansing / Measurement []  - 0 Simple Wound Cleansing - one wound []  - 0  Complex Wound Cleansing - multiple wounds []  - 0 Wound Imaging (photographs - any number of wounds) []  - 0 Wound Tracing (instead of photographs) []  - 0 Simple Wound Measurement - one wound []  - 0 Complex Wound Measurement - multiple wounds INTERVENTIONS - Wound Dressings []  - 0 Small Wound Dressing one or multiple wounds []  - 0 Medium Wound Dressing one or multiple wounds []  - 0 Large Wound Dressing one or multiple wounds []  - 0 Application of Medications - topical []  - 0 Application of Medications - injection INTERVENTIONS - Miscellaneous []  - 0 External ear exam []  - 0 Specimen Collection (cultures, biopsies, blood, body fluids, etc.) []  - 0 Specimen(s) / Culture(s) sent or taken to Lab for analysis []  - 0 Patient Transfer (multiple staff / Civil Service fast streamer / Similar devices) []  - 0 Simple Staple / Suture removal (25 or less) []  - 0 Complex Staple / Suture removal (26 or more) []  - 0 Hypo / Hyperglycemic Management (close monitor of Blood Glucose) []  - 0 Ankle / Brachial Index (ABI) - do not check if billed separately X- 1 5 Vital Signs Has the patient been seen at the hospital within the last three years: Yes Total Score: 30 Level Of Care: New/Established - Level 1 Electronic Signature(s) Signed: 05/06/2020 5:27:54 PM By: Deon Pilling Entered By: Deon Pilling on 05/06/2020 12:57:33 -------------------------------------------------------------------------------- Encounter Discharge Information Details Patient Name: Date of Service: [name] V [name] 05/06/2020 8:00 A M Medical Record Number: [id number] Patient Account Number: [id number] Date of Birth/Sex: Treating RN: 21-Jan-1970 (50 y.o. [name] Primary Care Chloie Loney: [name] Other Clinician: Referring Sanjna Haskew: Treating Amariyah Bazar/Extender: [name] in Treatment: 3 Encounter Discharge Information Items Discharge Condition: Stable Ambulatory Status: Ambulatory Discharge Destination: Home Transportation: Private Auto Accompanied By: self Schedule Follow-up Appointment: Yes Clinical Summary of Care: Electronic Signature(s) Signed: 05/06/2020 5:27:54 PM By: Deon Pilling Entered By: Deon Pilling on 05/06/2020 09:37:13 -------------------------------------------------------------------------------- Patient/Caregiver Education Details Patient Name: Date of Service: [name] 5/2/2022andnbsp8:00 A M Medical Record Number: [id number] Patient Account Number: [id number] Date of Birth/Gender: Treating RN: 06/23/1970 (50 y.o. [name] Primary Care Physician: [name] Other Clinician: Referring Physician: Treating Physician/Extender: [name] in Treatment: 3 Education Assessment Education Provided To: Patient Education Topics Provided Wound/Skin Impairment: Handouts: Skin Care Do's and Dont's Methods: Explain/Verbal Responses: Reinforcements needed Electronic Signature(s) Signed: 05/06/2020 5:27:54 PM By: Deon Pilling Entered By: Deon Pilling on 05/06/2020 09:37:04 -------------------------------------------------------------------------------- Vitals Details Patient Name: Date of Service: [name] V [name] 05/06/2020 8:00 A M Medical Record Number: [id number] Patient Account Number: [id number] Date of Birth/Sex: Treating RN: 11/07/1970 (50 y.o. [name], [name] Primary Care Montserrath Madding: [name] Other Clinician: Referring Jenyfer Trawick: Treating Luciel Brickman/Extender: [name] in Treatment: 3 Vital Signs Time Taken: 07:58 Temperature (F): 97.9 Height (in): 67 Pulse (bpm): 87 Weight (lbs): 257 Respiratory Rate (breaths/min): 18 Body Mass Index (BMI): 40.2 Blood Pressure (mmHg): 140/84 Reference  Range: 80 - 120 mg / dl Electronic Signature(s) Signed: 05/06/2020 5:27:54 PM By: Deon Pilling Entered By: Deon Pilling on 05/06/2020 09:29:46

## 2020-05-06 NOTE — Progress Notes (Signed)
CHEYLA, DUCHEMIN ([id number]) Visit Report for 05/03/2020 Arrival Information Details Patient Name: Date of Service: [name] 05/03/2020 8:00 A M Medical Record Number: [id number] Patient Account Number: [id number] Date of Birth/Sex: Treating RN: December 17, 1970 (50 y.o. [name] Primary Care Deniz Hannan: [name] Other Clinician: Referring Yazmina Pareja: Treating Johnaton Sonneborn/Extender: [name] in Treatment: 3 Visit Information History Since Last Visit Added or deleted any medications: No Patient Arrived: Ambulatory Any new allergies or adverse reactions: No Arrival Time: 08:00 Had a fall or experienced change in No Accompanied By: alone activities of daily living that may affect Transfer Assistance: None risk of falls: Patient Identification Verified: Yes Signs or symptoms of abuse/neglect since last visito No Secondary Verification Process Completed: Yes Hospitalized since last visit: No Patient Requires Transmission-Based Precautions: No Implantable device outside of the clinic excluding No Patient Has Alerts: No cellular tissue based products placed in the center since last visit: Has Dressing in Place as Prescribed: Yes Pain Present Now: No Electronic Signature(s) Signed: 05/06/2020 5:39:21 PM By: Levan Hurst RN, BSN Entered By: Levan Hurst on 05/03/2020 11:48:53 -------------------------------------------------------------------------------- Encounter Discharge Information Details Patient Name: Date of Service: [name] 05/03/2020 8:00 A M Medical Record Number: [id number] Patient Account Number: [id number] Date of Birth/Sex: Treating RN: 1970/08/27 (50 y.o. [name] Primary Care Kina Shiffman: [name] Other Clinician: Referring Andersyn Fragoso: Treating Colette Dicamillo/Extender: [name] in Treatment: 3 Encounter Discharge Information Items Discharge Condition:  Stable Ambulatory Status: Ambulatory Discharge Destination: Home Transportation: Private Auto Accompanied By: alone Schedule Follow-up Appointment: Yes Clinical Summary of Care: Patient Declined Electronic Signature(s) Signed: 05/06/2020 5:39:21 PM By: Levan Hurst RN, BSN Entered By: Levan Hurst on 05/03/2020 11:51:27 -------------------------------------------------------------------------------- Vitals Details Patient Name: Date of Service: [name] 05/03/2020 8:00 A M Medical Record Number: [id number] Patient Account Number: [id number] Date of Birth/Sex: Treating RN: 1970-06-02 (50 y.o. [name] Primary Care Catilyn Boggus: [name] Other Clinician: Referring Alysa Duca: Treating Skyeler Scalese/Extender: [name] in Treatment: 3 Vital Signs Time Taken: 08:00 Temperature (F): 98.0 Height (in): 67 Pulse (bpm): 89 Weight (lbs): 257 Respiratory Rate (breaths/min): 18 Body Mass Index (BMI): 40.2 Blood Pressure (mmHg): 132/80 Reference Range: 80 - 120 mg / dl Electronic Signature(s) Signed: 05/06/2020 5:39:21 PM By: Levan Hurst RN, BSN Entered By: Levan Hurst on 05/03/2020 11:49:12

## 2020-05-06 NOTE — Progress Notes (Signed)
JEARLDEAN, GUTT ([id number]) Visit Report for 05/06/2020 SuperBill Details Patient Name: Date of Service: [name] 05/06/2020 Medical Record Number: [id number] Patient Account Number: [id number] Date of Birth/Sex: Treating RN: 06/11/1970 (50 y.o. 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

## 2020-05-07 ENCOUNTER — Encounter (HOSPITAL_BASED_OUTPATIENT_CLINIC_OR_DEPARTMENT_OTHER): Payer: Medicaid Other | Admitting: Internal Medicine

## 2020-05-07 ENCOUNTER — Other Ambulatory Visit: Payer: Self-pay

## 2020-05-07 ENCOUNTER — Encounter (HOSPITAL_BASED_OUTPATIENT_CLINIC_OR_DEPARTMENT_OTHER): Payer: Medicaid Other | Attending: Internal Medicine | Admitting: Internal Medicine

## 2020-05-07 ENCOUNTER — Other Ambulatory Visit (HOSPITAL_COMMUNITY): Payer: Self-pay

## 2020-05-07 DIAGNOSIS — C50412 Malignant neoplasm of upper-outer quadrant of left female breast: Secondary | ICD-10-CM | POA: Diagnosis not present

## 2020-05-08 ENCOUNTER — Encounter (HOSPITAL_BASED_OUTPATIENT_CLINIC_OR_DEPARTMENT_OTHER): Payer: Medicaid Other | Admitting: Physician Assistant

## 2020-05-08 ENCOUNTER — Other Ambulatory Visit (HOSPITAL_COMMUNITY): Payer: Self-pay

## 2020-05-08 ENCOUNTER — Other Ambulatory Visit: Payer: Self-pay

## 2020-05-08 DIAGNOSIS — C50412 Malignant neoplasm of upper-outer quadrant of left female breast: Secondary | ICD-10-CM | POA: Diagnosis not present

## 2020-05-08 MED ORDER — LORAZEPAM 1 MG PO TABS
1.0000 mg | ORAL_TABLET | Freq: Every day | ORAL | 0 refills | Status: DC
  Filled 2020-05-08: qty 25, 25d supply, fill #0

## 2020-05-08 NOTE — Progress Notes (Signed)
[place], DOST ([id number]) Visit Report for 05/08/2020 Arrival Information Details Patient Name: Date of Service: [name] [name] 05/08/2020 8:00 [name] Medical Record Number: [id number] Patient Account Number: [id number] Date of Birth/Sex: Treating RN: August 17, 1970 (50 y.o. [name], [name] Primary Care Zarrah Loveland: [name] Other Clinician: Referring Antinette [name]: Treating Bertran Zeimet/Extender: Randon Goldsmith in Treatment: 4 Visit Information History Since Last Visit Added or deleted any medications: No Patient Arrived: Ambulatory Any new allergies or adverse reactions: No Arrival Time: 07:50 Had Sarah fall or experienced change in No Accompanied By: self activities of daily living that may affect Transfer Assistance: None risk of falls: Patient Identification Verified: Yes Signs or symptoms of abuse/neglect since last visito No Secondary Verification Process Completed: Yes Hospitalized since last visit: No Patient Requires Transmission-Based Precautions: No Implantable device outside of the clinic excluding No Patient Has Alerts: No cellular tissue based products placed in the center since last visit: Has Dressing in Place as Prescribed: Yes Pain Present Now: No Electronic Signature(s) Signed: 05/08/2020 6:54:10 PM By: Deon Pilling Entered By: Deon Pilling on 05/08/2020 10:31:52 -------------------------------------------------------------------------------- Encounter Discharge Information Details Patient Name: Date of Service: [name] V [name] 05/08/2020 8:00 [name] Medical Record Number: [id number] Patient Account Number: [id number] Date of Birth/Sex: Treating RN: 1970-08-29 (50 y.o. [name] Primary Care Baelyn Doring: [name] Other Clinician: Referring Linzie [name]: Treating Maizy Davanzo/Extender: Randon Goldsmith in Treatment: 4 Encounter Discharge Information Items Discharge Condition: Stable Ambulatory Status:  Ambulatory Discharge Destination: Home Transportation: Private Auto Accompanied By: self Schedule Follow-up Appointment: Yes Clinical Summary of Care: Electronic Signature(s) Signed: 05/08/2020 6:54:10 PM By: Deon Pilling Entered By: Deon Pilling on 05/08/2020 10:34:02 -------------------------------------------------------------------------------- Patient/Caregiver Education Details Patient Name: Date of Service: [name] [name] 5/4/2022andnbsp8:00 [name] Medical Record Number: [id number] Patient Account Number: [id number] Date of Birth/Gender: Treating RN: April 23, 1970 (50 y.o. [name] Primary Care Physician: [name] Other Clinician: Referring Physician: Treating Physician/Extender: Randon Goldsmith in Treatment: 4 Education Assessment Education Provided To: Patient Education Topics Provided Wound/Skin Impairment: Handouts: Skin Care Do's and Dont's Methods: Explain/Verbal Responses: Reinforcements needed Electronic Signature(s) Signed: 05/08/2020 6:54:10 PM By: Deon Pilling Entered By: Deon Pilling on 05/08/2020 10:33:53 -------------------------------------------------------------------------------- Vitals Details Patient Name: Date of Service: [name] V [name] 05/08/2020 8:00 [name] Medical Record Number: [id number] Patient Account Number: [id number] Date of Birth/Sex: Treating RN: 1970/01/16 (50 y.o. [name], [name] Primary Care Caster Fayette: [name] Other Clinician: Referring Ozelle Brubacher: Treating Mira Balon/Extender: Randon Goldsmith in Treatment: 4 Vital Signs Time Taken: 07:50 Temperature (F): 97.9 Height (in): 67 Pulse (bpm): 93 Weight (lbs): 257 Respiratory Rate (breaths/min): 18 Body Mass Index (BMI): 40.2 Blood Pressure (mmHg): 110/90 Reference Range: 80 - 120 mg / dl Electronic Signature(s) Signed: 05/08/2020 6:54:10 PM By: Deon Pilling Entered By: Deon Pilling on 05/08/2020  10:32:11

## 2020-05-08 NOTE — Progress Notes (Signed)
CARLENA, RUYBAL ([id number]) Visit Report for 05/08/2020 HBO Details Patient Name: Date of Service: [name] V [name] 05/08/2020 8:00 A M Medical Record Number: [id number] Patient Account Number: [id number] Date of Birth/Sex: Treating RN: 1970-04-30 (50 y.o. 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 HBO Treatment Details Treatment Number: 16 Patient Type: Outpatient Chamber Type: Monoplace Chamber Serial #: [id number] Treatment Protocol: 2.5 ATA with 90 minutes oxygen, with two 5 minute air breaks Treatment Details Compression Rate Down: 2.0 psi / minute De-Compression Rate Up: 2.0 psi / minute A breaks and breathing ir Compress Tx Pressure periods Decompress Decompress Begins Reached (leave unused spaces Begins Ends blank) Chamber Pressure (ATA 1 2.5 2.5 2.5 2.5 2.5 - - 2.5 1 ) Clock Time (24 hr) 08:06 08:18 08:48 08:53 09:23 09:28 - - 09:58 10:09 Treatment Length: 123 (minutes) Treatment Segments: 4 Vital Signs Capillary Blood Glucose Reference Range: 80 - 120 mg / dl HBO Diabetic Blood Glucose Intervention Range: <131 mg/dl or >249 mg/dl Time Vitals Blood Respiratory Capillary Blood Glucose Pulse Action Type: Pulse: Temperature: Taken: Pressure: Rate: Glucose (mg/dl): Meter #: Oximetry (%) Taken: Pre 07:50 110/90 93 18 97.9 Post 10:12 120/74 76 18 97.8 Treatment Response Treatment Toleration: Well Treatment Completion Status: Treatment Completed without Adverse Event Electronic Signature(s) Signed: 05/08/2020 6:39:46 PM By: Worthy Keeler PA-C Signed: 05/08/2020 6:54:10 PM By: Deon Pilling Entered By: Deon Pilling on  05/08/2020 10:33:26 -------------------------------------------------------------------------------- HBO Safety Checklist Details Patient Name: Date of Service: [name] 05/08/2020 8:00 A M Medical Record Number: [id number] Patient Account Number: [id number] Date of Birth/Sex: Treating RN: October 20, 1970 (50 y.o. [name], [name] Primary Care Sparrow [place]: [name] Other Clinician: Referring Wandalene Abrams: Treating Kaeli Nichelson/Extender: Randon Goldsmith in Treatment: 4 HBO Safety Checklist Items Safety Checklist Consent Form Signed Patient voided / foley secured and emptied When did you last eato this morning Last dose of injectable or oral agent n/a Ostomy pouch emptied and vented if applicable NA All implantable devices assessed, documented and approved NA Intravenous access site secured and place NA Valuables secured Linens and cotton and cotton/polyester blend (less than 51% polyester) Personal oil-based products / skin lotions / body lotions removed NA Wigs or hairpieces removed NA Smoking or tobacco materials removed NA Books / newspapers / magazines / loose paper removed NA Cologne, aftershave, perfume and deodorant removed NA Jewelry removed (may wrap wedding band) Make-up removed NA Hair care products removed NA Battery operated devices (external) removed NA Heating patches and chemical warmers removed NA Titanium eyewear removed Nail polish cured greater than 10 hours Casting material cured greater than 10 hours NA Hearing aids removed NA Loose dentures or partials removed NA Prosthetics have been removed NA Patient demonstrates correct use of air break device (if applicable) Patient concerns have been addressed Patient grounding bracelet on and cord attached to chamber Specifics for Inpatients (complete in addition to above) Medication sheet sent with patient Intravenous medications needed or due during therapy sent with  patient Drainage tubes (e.g. nasogastric tube or chest tube secured and vented) Endotracheal or Tracheotomy tube secured Cuff deflated of air and inflated with saline Airway suctioned Electronic Signature(s) Signed: 05/08/2020 6:54:10 PM By:  Deaton, Bobbi Entered By: Deon Pilling on 05/08/2020 10:32:44

## 2020-05-08 NOTE — Progress Notes (Signed)
[name], [name] ([id number]) Visit Report for 05/08/2020 SuperBill Details Patient Name: Date of Service: [name] [name] 05/08/2020 Medical Record Number: [id number] Patient Account Number: [id number] Date of Birth/Sex: Treating RN: 04/15/1970 (50 y.o. 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 Facility Procedures CPT4 Code Description Modifier Quantity 61607371 G0277-(Facility Use Only) HBOT full body chamber, 64min , 4 Physician Procedures Quantity CPT4 Code Description Modifier [id number] - WC PHYS HYPERBARIC OXYGEN THERAPY 1 ICD-10 Diagnosis Description 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

## 2020-05-09 ENCOUNTER — Other Ambulatory Visit (HOSPITAL_COMMUNITY): Payer: Self-pay

## 2020-05-09 ENCOUNTER — Encounter (HOSPITAL_BASED_OUTPATIENT_CLINIC_OR_DEPARTMENT_OTHER): Payer: Medicaid Other | Admitting: Internal Medicine

## 2020-05-09 ENCOUNTER — Other Ambulatory Visit: Payer: Self-pay

## 2020-05-09 DIAGNOSIS — C50412 Malignant neoplasm of upper-outer quadrant of left female breast: Secondary | ICD-10-CM | POA: Diagnosis not present

## 2020-05-09 MED ORDER — LORAZEPAM 1 MG PO TABS
ORAL_TABLET | ORAL | 0 refills | Status: DC
  Filled 2020-05-09: qty 25, 25d supply, fill #0

## 2020-05-09 MED ORDER — LORAZEPAM 1 MG PO TABS: ORAL_TABLET | ORAL | 0 refills | Status: DC

## 2020-05-09 NOTE — Progress Notes (Signed)
[name], [name] ([id number]) Visit Report for 05/09/2020 Arrival Information Details Patient Name: Date of Service: [name] 05/09/2020 8:00 A M Medical Record Number: [id number] Patient Account Number: [id number] Date of Birth/Sex: Treating RN: [name], [name] (50 y.o. [name], [name] Primary Care [name]: [name] Other Clinician: Referring [name]: Treating [name]/Extender: [name] in Treatment: 4 Visit Information History Since Last Visit Added or deleted any medications: No Patient Arrived: Ambulatory Any new allergies or adverse reactions: No Arrival Time: 08:05 Had a fall or experienced change in No Accompanied By: alone activities of daily living that may affect Transfer Assistance: None risk of falls: Patient Identification Verified: Yes Signs or symptoms of abuse/neglect since last visito No Secondary Verification Process Completed: Yes Hospitalized since last visit: No Patient Requires Transmission-Based Precautions: No Implantable device outside of the clinic excluding No Patient Has Alerts: No cellular tissue based products placed in the center since last visit: Has Dressing in Place as Prescribed: Yes Pain Present Now: No Electronic Signature(s) Signed: 05/09/2020 5:41:41 PM By: Levan Hurst RN, BSN Entered By: Levan Hurst on 05/09/2020 10:59:40 -------------------------------------------------------------------------------- Encounter Discharge Information Details Patient Name: Date of Service: [name] [name] 05/09/2020 8:00 A M Medical Record Number: [id number] Patient Account Number: [id number] Date of Birth/Sex: Treating RN: [name] (50 y.o. [name] Primary Care [name]: [name] Other Clinician: Referring [name]: Treating [name] in Treatment: 4 Encounter Discharge Information Items Discharge Condition:  Stable Ambulatory Status: Ambulatory Discharge Destination: Home Transportation: Private Auto Accompanied By: alone Schedule Follow-up Appointment: Yes Clinical Summary of Care: Patient Declined Electronic Signature(s) Signed: 05/09/2020 5:41:41 PM By: Levan Hurst RN, BSN Entered By: Levan Hurst on 05/09/2020 11:01:49 -------------------------------------------------------------------------------- Vitals Details Patient Name: Date of Service: [name] [name] 05/09/2020 8:00 A M Medical Record Number: [id number] Patient Account Number: [id number] Date of Birth/Sex: Treating RN: [name] (50 y.o. [name] Primary Care Baylyn Sickles: [name] Other Clinician: Referring Keyen Marban: Treating Delshawn Stech/Extender: [name] in Treatment: 4 Vital Signs Time Taken: 08:05 Temperature (F): 98.3 Height (in): 67 Pulse (bpm): 92 Weight (lbs): 257 Respiratory Rate (breaths/min): 18 Body Mass Index (BMI): 40.2 Blood Pressure (mmHg): 125/86 Reference Range: 80 - 120 mg / dl Electronic Signature(s) Signed: 05/09/2020 5:41:41 PM By: Levan Hurst RN, BSN Entered By: Levan Hurst on 05/09/2020 10:59:59

## 2020-05-09 NOTE — Progress Notes (Signed)
[name], [name] ([id number]) Visit Report for 05/07/2020 Arrival Information Details Patient Name: Date of Service: [name] 05/07/2020 8:00 A M Medical Record Number: [id number] Patient Account Number: [id number] Date of Birth/Sex: Treating RN: February 08, 1970 (50 y.o. [name] Primary Care Markala Sitts: [name] Other Clinician: Referring Maci Eickholt: Treating Alethea Terhaar/Extender: [name] in Treatment: 4 Visit Information History Since Last Visit Added or deleted any medications: No Patient Arrived: Ambulatory Any new allergies or adverse reactions: No Arrival Time: 08:50 Had a fall or experienced change in No Accompanied By: alone activities of daily living that may affect Transfer Assistance: None risk of falls: Patient Requires Transmission-Based Precautions: No Signs or symptoms of abuse/neglect since last visito No Patient Has Alerts: No Hospitalized since last visit: No Implantable device outside of the clinic excluding No cellular tissue based products placed in the center since last visit: Has Dressing in Place as Prescribed: Yes Pain Present Now: No Electronic Signature(s) Signed: 05/09/2020 5:41:41 PM By: Levan Hurst RN, BSN Entered By: Levan Hurst on 05/07/2020 09:02:01 -------------------------------------------------------------------------------- Encounter Discharge Information Details Patient Name: Date of Service: Sarah Crochet V [name] 05/07/2020 8:00 A M Medical Record Number: [id number] Patient Account Number: [id number] Date of Birth/Sex: Treating RN: [birth year] (50 y.o. [name] Primary Care Malin Sambrano: [name] Other Clinician: Referring Makalah Asberry: Treating Erion Hermans/Extender: [name] in Treatment: 4 Encounter Discharge Information Items Discharge Condition: Stable Ambulatory Status: Ambulatory Discharge Destination: Home Transportation: Private  Auto Accompanied By: alone Schedule Follow-up Appointment: Yes Clinical Summary of Care: Patient Declined Electronic Signature(s) Signed: 05/09/2020 5:41:41 PM By: Levan Hurst RN, BSN Entered By: Levan Hurst on 05/07/2020 14:37:02 -------------------------------------------------------------------------------- Vitals Details Patient Name: Date of Service: Sarah Crochet V [name] 05/07/2020 8:00 A M Medical Record Number: [id number] Patient Account Number: [id number] Date of Birth/Sex: Treating RN: 11/28/1970 (50 y.o. [name] Primary Care Bettey Muraoka: [name] Other Clinician: Referring Lucilla Petrenko: Treating Gabriella Guile/Extender: [name] in Treatment: 4 Vital Signs Time Taken: 08:50 Temperature (F): 98.0 Height (in): 67 Pulse (bpm): 99 Weight (lbs): 257 Respiratory Rate (breaths/min): 18 Body Mass Index (BMI): 40.2 Blood Pressure (mmHg): 124/84 Reference Range: 80 - 120 mg / dl Electronic Signature(s) Signed: 05/09/2020 5:41:41 PM By: Levan Hurst RN, BSN Entered By: Levan Hurst on 05/07/2020 09:02:16

## 2020-05-09 NOTE — Progress Notes (Signed)
[name], [name] ([id number]) Visit Report for 05/07/2020 SuperBill Details Patient Name: Date of Service: [name] [name] 05/07/2020 Medical Record Number: [id number] Patient Account Number: [id number] Date of Birth/Sex: Treating RN: [birth year] (50 y.o. 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 Disruption of external operation (surgical) wound, not elsewhere classified, subsequent encounter 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 Facility Procedures CPT4 Code Description Modifier Quantity [id number] G0277-(Facility Use Only) HBOT full body chamber, 82min , 4 Physician Procedures Quantity CPT4 Code Description Modifier [id number] 94765 - WC PHYS HYPERBARIC OXYGEN THERAPY 1 ICD-10 Diagnosis Description 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 Electronic Signature(s) Signed: 05/07/2020 4:28:01 PM By: [name] MD Signed: 05/09/2020 5:41:41 PM By: Sarah Hurst RN, BSN Entered By: [name] on 05/07/2020 14:36:37

## 2020-05-09 NOTE — Progress Notes (Signed)
Sarah, Khan ([id number]) Visit Report for 05/09/2020 HBO Details Patient Name: Date of Service: [name]. 05/09/2020 8:00 A M Medical Record Number: [id number] Patient Account Number: [id number] Date of Birth/Sex: Treating RN: June 08, 1970 (50 y.o. 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 HBO Treatment Details Treatment Number: 17 Patient Type: Outpatient Chamber Type: Monoplace Chamber Serial #: [id number] Treatment Protocol: 2.5 ATA with 90 minutes oxygen, with two 5 minute air breaks Treatment Details Compression Rate Down: 2.0 psi / minute De-Compression Rate Up: 2.0 psi / minute A breaks and breathing ir Compress Tx Pressure periods Decompress Decompress Begins Reached (leave unused spaces Begins Ends blank) Chamber Pressure (ATA 1 2.5 2.5 2.5 2.5 2.5 - - 2.5 1 ) Clock Time (24 hr) 08:11 08:23 08:53 08:58 09:28 09:33 - - 10:03 10:15 Treatment Length: 124 (minutes) Treatment Segments: 4 Vital Signs Capillary Blood Glucose Reference Range: 80 - 120 mg / dl HBO Diabetic Blood Glucose Intervention Range: <131 mg/dl or >249 mg/dl Time Vitals Blood Respiratory Capillary Blood Glucose Pulse Action Type: Pulse: Temperature: Taken: Pressure: Rate: Glucose (mg/dl): Meter #: Oximetry (%) Taken: Pre 08:05 125/86 92 18 98.3 Post 10:15 115/74 78 16 97.9 Treatment Response Treatment Completion Status: Treatment Completed without Adverse Event Ailani Governale Notes No concerns with treatment given Physician HBO Attestation: I certify that I supervised this HBO treatment in accordance with Medicare guidelines. A trained  emergency response team is readily available per Yes hospital policies and procedures. Continue HBOT as ordered. Yes Electronic Signature(s) Signed: 05/09/2020 5:06:47 PM By: [name] MD Entered By: [name] on 05/09/2020 17:06:13 -------------------------------------------------------------------------------- HBO Safety Checklist Details Patient Name: Date of Service: [name]. 05/09/2020 8:00 A M Medical Record Number: [id number] Patient Account Number: [id number] Date of Birth/Sex: Treating RN: 10/30/1970 (50 y.o. [name] Primary Care Rayshawn Maney: [name] Other Clinician: Referring Lore Polka: Treating Gale Klar/Extender: [name] in Treatment: 4 HBO Safety Checklist Items Safety Checklist Consent Form Signed Patient voided / foley secured and emptied When did you last eato 0700 Last dose of injectable or oral agent na Ostomy pouch emptied and vented if applicable NA All implantable devices assessed, documented and approved Intravenous access site secured and place NA Valuables secured Linens and cotton and cotton/polyester blend (less than 51% polyester) Personal oil-based products / skin lotions / body lotions removed Wigs or hairpieces removed Smoking or tobacco materials removed Books / newspapers / magazines / loose paper removed Cologne, aftershave, perfume and deodorant removed Jewelry removed (may wrap wedding band) Make-up removed Hair care products removed Battery operated devices (external) removed Heating patches and chemical warmers removed Titanium eyewear removed Nail polish cured greater than 10 hours Casting material cured greater than 10 hours NA Hearing aids removed NA Loose dentures or partials removed Prosthetics have been removed NA Patient demonstrates correct use of air break device (if applicable) Patient concerns have been addressed Patient grounding bracelet on and  cord attached to chamber Specifics for Inpatients (complete in addition to above) Medication sheet sent with patient NA Intravenous medications needed or due during therapy sent with patient NA Drainage tubes (e.g. nasogastric tube or chest tube secured and vented) NA Endotracheal or  Tracheotomy tube secured NA Cuff deflated of air and inflated with saline NA Airway suctioned NA Electronic Signature(s) Signed: 05/09/2020 5:41:41 PM By: Levan Hurst RN, BSN Entered By: Levan Hurst on 05/09/2020 11:00:29

## 2020-05-09 NOTE — Progress Notes (Signed)
[name], [name] ([id number]) Visit Report for 05/09/2020 SuperBill Details Patient Name: Date of Service: [name] 05/09/2020 Medical Record Number: [id number] Patient Account Number: [id number] Date of Birth/Sex: Treating RN: 16-Dec-1970 (51 y.o. 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 Disruption of external operation (surgical) wound, not elsewhere classified, subsequent encounter 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 Facility Procedures CPT4 Code Description Modifier Quantity [id number] G0277-(Facility Use Only) HBOT full body chamber, 20min , 4 Physician Procedures Quantity CPT4 Code Description Modifier [id number] 59163 - WC PHYS HYPERBARIC OXYGEN THERAPY 1 ICD-10 Diagnosis Description 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 Electronic Signature(s) Signed: 05/09/2020 5:06:47 PM By: [name] MD Signed: 05/09/2020 5:41:41 PM By: Levan Hurst RN, BSN Entered By: Levan Hurst on 05/09/2020 11:01:34

## 2020-05-09 NOTE — Progress Notes (Signed)
LILIT, CINELLI ([id number]) Visit Report for 05/07/2020 HBO Details Patient Name: Date of Service: [name] V [name] 05/07/2020 8:00 A M Medical Record Number: [id number] Patient Account Number: [id number] Date of Birth/Sex: Treating RN: 02/26/1970 (50 y.o. 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 HBO Treatment Details Treatment Number: 15 Patient Type: Outpatient Chamber Type: Monoplace Chamber Serial #: [id number] Treatment Protocol: 2.5 ATA with 90 minutes oxygen, with two 5 minute air breaks Treatment Details Compression Rate Down: 2.0 psi / minute De-Compression Rate Up: 2.0 psi / minute A breaks and breathing ir Compress Tx Pressure periods Decompress Decompress Begins Reached (leave unused spaces Begins Ends blank) Chamber Pressure (ATA 1 2.5 2.5 2.5 2.5 2.5 - - 2.5 1 ) Clock Time (24 hr) 08:59 09:11 09:41 09:46 10:16 10:21 - - 10:51 11:03 Treatment Length: 124 (minutes) Treatment Segments: 4 Vital Signs Capillary Blood Glucose Reference Range: 80 - 120 mg / dl HBO Diabetic Blood Glucose Intervention Range: <131 mg/dl or >249 mg/dl Time Vitals Blood Respiratory Capillary Blood Glucose Pulse Action Type: Pulse: Temperature: Taken: Pressure: Rate: Glucose (mg/dl): Meter #: Oximetry (%) Taken: Pre 08:50 124/84 99 18 98 Post 11:03 121/81 75 18 97.9 Treatment Response Treatment Completion Status: Treatment Completed without Adverse Event Godric Lavell Notes No concerns with treatment given. Patient was also seen for wound care evaluation today Physician HBO Attestation: I certify that I supervised this HBO treatment in  accordance with Medicare guidelines. A trained emergency response team is readily available per Yes hospital policies and procedures. Continue HBOT as ordered. Yes Electronic Signature(s) Signed: 05/07/2020 4:28:01 PM By: [name] MD Entered By: [name] on 05/07/2020 16:27:00 -------------------------------------------------------------------------------- HBO Safety Checklist Details Patient Name: Date of Service: [name] 05/07/2020 8:00 A M Medical Record Number: [id number] Patient Account Number: [id number] Date of Birth/Sex: Treating RN: Dec 05, 1970 (50 y.o. [name] Primary Care Jamair Cato: [name] Other Clinician: Referring Kaelem Brach: Treating Nevin Grizzle/Extender: [name] in Treatment: 4 HBO Safety Checklist Items Safety Checklist Consent Form Signed Patient voided / foley secured and emptied When did you last eato 0700 Last dose of injectable or oral agent na Ostomy pouch emptied and vented if applicable NA All implantable devices assessed, documented and approved Intravenous access site secured and place NA Valuables secured Linens and cotton and cotton/polyester blend (less than 51% polyester) Personal oil-based products / skin lotions / body lotions removed Wigs or hairpieces removed Smoking or tobacco materials removed Books / newspapers / magazines / loose paper removed Cologne, aftershave, perfume and deodorant removed Jewelry removed (may wrap wedding band) Make-up removed Hair care products removed Battery operated devices (external) removed Heating patches and chemical warmers removed Titanium eyewear removed Nail polish cured greater than 10 hours Casting material cured greater than 10 hours NA Hearing aids removed NA Loose dentures or partials removed Prosthetics have been removed NA Patient demonstrates correct use of air break device (if applicable) Patient concerns have been  addressed Patient grounding bracelet on and cord attached to chamber Specifics for Inpatients (complete in addition to above) Medication sheet sent with patient NA Intravenous medications needed or due during therapy sent with patient NA Drainage tubes (e.g. nasogastric tube  or chest tube secured and vented) NA Endotracheal or Tracheotomy tube secured NA Cuff deflated of air and inflated with saline NA Airway suctioned NA Electronic Signature(s) Signed: 05/09/2020 5:41:41 PM By: Levan Hurst RN, BSN Entered By: Levan Hurst on 05/07/2020 14:34:48

## 2020-05-10 ENCOUNTER — Other Ambulatory Visit: Payer: Self-pay

## 2020-05-10 ENCOUNTER — Encounter (HOSPITAL_BASED_OUTPATIENT_CLINIC_OR_DEPARTMENT_OTHER): Payer: Medicaid Other | Admitting: Internal Medicine

## 2020-05-10 DIAGNOSIS — C50412 Malignant neoplasm of upper-outer quadrant of left female breast: Secondary | ICD-10-CM | POA: Diagnosis not present

## 2020-05-10 NOTE — Progress Notes (Signed)
[name], [name] ([id number]) Visit Report for 04/23/2020 SuperBill Details Patient Name: Date of Service: [name] 04/23/2020 Medical Record Number: [id number] Patient Account Number: [id number] Date of Birth/Sex: Treating RN: 03-Nov-1970 (50 y.o. [name] Primary Care Provider: [name] Other Clinician: Referring Provider: Treating Provider/Extender: [name] in Treatment: 2 Diagnosis Coding ICD-10 Codes Code Description [phone] Intraductal carcinoma in situ of left breast T81.31XD Disruption of external operation (surgical) wound, not elsewhere classified, subsequent encounter 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 Facility Procedures CPT4 Code Description Modifier Quantity [id number] - WOUND CARE VISIT-LEV 3 EST PT 1 Electronic Signature(s) Signed: 04/23/2020 4:57:15 PM By: Lorrin Jackson Signed: 05/10/2020 4:10:40 PM By: Kalman Shan DO Entered By: Lorrin Jackson on 04/23/2020 08:22:13

## 2020-05-10 NOTE — Progress Notes (Signed)
NOMI, RUDNICKI ([id number]) Visit Report for 05/07/2020 Debridement Details Patient Name: Date of Service: [name] Khan [name] 05/07/2020 7:30 [name] M Medical Record Number: [id number] Patient Account Number: [id number] Date of Birth/Sex: Treating RN: 1970-11-27 (50 y.o. [name] Khan, [name] Khan Primary Care Provider: Karle Khan Other Clinician: Referring Provider: Treating Provider/Extender: [name] Khan in Treatment: 4 Debridement Performed for Assessment: Wound #1 Left Chest Performed By: Physician [name] Khan., MD Debridement Type: Debridement Level of Consciousness (Pre-procedure): Awake and Alert Pre-procedure Verification/Time Out Yes - 08:14 Taken: Start Time: 08:14 Pain Control: Lidocaine T Area Debrided (L x W): otal 5.2 (cm) x 4.5 (cm) = 23.4 (cm) Tissue and other material debrided: Viable, Non-Viable, Slough, Subcutaneous, Skin: Dermis , Slough Level: Skin/Subcutaneous Tissue Debridement Description: Excisional Instrument: Curette Bleeding: Minimum Hemostasis Achieved: Pressure End Time: 08:15 Procedural Pain: 0 Post Procedural Pain: 0 Response to Treatment: Procedure was tolerated well Level of Consciousness (Post- Awake and Alert procedure): Post Debridement Measurements of Total Wound Length: (cm) 5.2 Width: (cm) 4.5 Depth: (cm) 2.3 Volume: (cm) 42.27 Character of Wound/Ulcer Post Debridement: Improved Post Procedure Diagnosis Same as Pre-procedure Electronic Signature(s) Signed: 05/07/2020 4:28:01 PM By: [name] Ham MD Signed: 05/10/2020 3:14:58 PM By: Sarah Hammock RN Entered By: [name] Khan on 05/07/2020 08:25:12 -------------------------------------------------------------------------------- HPI Details Patient Name: Date of Service: [name] Khan V [name] 05/07/2020 7:30 [name] M Medical Record Number: [id number] Patient Account Number: [id number] Date of Birth/Sex: Treating RN: 1970-06-29 (50 y.o. [name] Khan, [name] Khan Primary Care Provider: Karle Khan Other Clinician: Referring Provider: Treating Provider/Extender: [name] Khan in Treatment: 4 History of Present Illness HPI Description: ADMISSION 04/09/2020 [name] 50 year old woman who is history of breast cancer in the left breast goes back to 2018. He was found to have [name] mass in her left breast. She underwent [name] left back lumpectomy and then subsequent chemo that she did not tolerate very well. She had seed implants placed but then underwent external beam [name] radiation in the spring 2019. 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 Her adjunctive radiation was from 03/11/2017 through 04/21/2017. Unfortunately she had [name] left breast biopsy on 11/24/2019 and it was again positive for an invasive ductal carcinoma grade 3 he is to resume at receptor 80% positive. HER-2 negative progesterone negative. She opted for bilateral mastectomies which were done on 01/09/2020 on the right with no evidence of malignancy on the left stage IIb invasive ductal carcinoma. She again received chemotherapy but she did not tolerate it because of wound dehiscence and low platelet counts according to the patient. Unfortunately her mastectomy wound dehisced on the left breast. She was taken back to the OR by [name] of general surgery on 33/24/22 and she underwent [name] debridement and subsequent attempt at closure. She had been previously treated with Augmentin for suspected underlying infection. Once again the wound has dehisced in the mid aspect. It is roughly 1 cm deep there is tunnels both medially and laterally she. She still has sutures in place. Still describes constant pain drainage. She also has [name] drain still in place that drains about 10 cc/day. Currently she is changing ABD pad several times [name]  day 4/12; patient went back to see [name] who remove most of the stitches and debrided her wound. She has been using silver alginate culture  I did last time which was [name] PCR culture showed Peptostreptococcus, Enterococcus faecalis and Staphylococcus epidermidis which is of questionable significance. She has [name] larger wound now that the sutures have been removed. There are undermining areas superior medial superior lateral and [name] deep tunneling area in the inferior part of the wound. There is no obvious purulence. I started her on Augmentin on 04/12/2020 and I will likely extend this for another week AMAZINGLY; she is already been approved for HBO and will start today. [name] Echocardiogram which I reviewed from 2019 showed [name] normal ejection fraction.. I went over side effects of HBO with the patient and answered any of her questions. Her husband was present 5/3; its been [name] while since have seen this wound. She went to see [name] about 2-1/2 weeks ago according to the patient he can remove some of the surrounding debris. She will see him again later this week or early next week. She has been using silver alginate. [name] lot of this is problematic since she has Medicaid which traditionally does not cover wound care supplies although it traditionally does not cover HBO either Her wound actually looks quite [name] bit better than the last time I saw this better looking surface healthy looking tissue. She has tunnels at 810 2 in 6 the deepest of these is at 2 at 2.2 cm there is no exposed bone. Electronic Signature(s) Signed: 05/07/2020 4:28:01 PM By: [name] Ham MD Entered By: [name] Khan on 05/07/2020 08:26:35 -------------------------------------------------------------------------------- Physical Exam Details Patient Name: Date of Service: [name] Khan V [name] 05/07/2020 7:30 [name] M Medical Record Number: [id number] Patient Account Number: [id number] Date of Birth/Sex:  Treating RN: 26-Jul-1970 (50 y.o. [name] Khan, [name] Khan Primary Care Provider: Karle Khan Other Clinician: Referring Provider: Treating Provider/Extender: [name] Khan in Treatment: 4 Constitutional Sitting or standing Blood Pressure is within target range for patient.. Pulse regular and within target range for patient.Marland Kitchen Respirations regular, non-labored and within target range.. Temperature is normal and within the target range for the patient.Marland Kitchen Appears in no distress. Notes Wound exam; the patient has an open wound with depth. There is undermining tunnels at 810 2 and 6 the deepest of the toes is up to 2.2 cm. I used [name] #5 curette to remove some surface debris in general the granulation looks reasonably healthy. There is no evidence of surrounding infection Electronic Signature(s) Signed: 05/07/2020 4:28:01 PM By: [name] Ham MD Entered By: [name] Khan on 05/07/2020 08:27:59 -------------------------------------------------------------------------------- Physician Orders Details Patient Name: Date of Service: [name] Khan V [name] 05/07/2020 7:30 [name] M Medical Record Number: [id number] Patient Account Number: [id number] Date of Birth/Sex: Treating RN: 09/19/1970 (50 y.o. [name] Khan, [name] Khan Primary Care Provider: Karle Khan Other Clinician: Referring Provider: Treating Provider/Extender: [name] Khan in Treatment: 4 Verbal / Phone Orders: No Diagnosis Coding Follow-up Appointments Return appointment in 3 weeks. - Dr. Dellia Nims Nurse Visit: - 05/10 05/17 Bathing/ Shower/ Hygiene May shower with protection but do not get wound dressing(s) wet. Hyperbaric Oxygen Therapy Evaluate for HBO Therapy Indication: - soft tissue radionecrosis 2.5 ATA for 90 Minutes with 2 Five (5) Minute [name] Breaks ir Total Number of Treatments: - 40 One treatments per day (delivered Monday through Friday unless otherwise specified in Special  Instructions below): Afrin (Oxymetazoline HCL) 0.05% nasal spray - 1 spray in both nostrils daily as needed prior to HBO treatment for difficulty clearing ears Wound Treatment Wound #  1 - Chest Wound Laterality: Left Cleanser: Normal Saline (DME) (Generic) Every Other Day/15 Days Discharge Instructions: Cleanse the wound with Normal Saline prior to applying [name] clean dressing using gauze sponges, not tissue or cotton balls. Prim Dressing: IODOFLEX 0.9% Cadexomer Iodine Pad 4x6 cm (DME) (Generic) Every Other Day/15 Days ary Discharge Instructions: Apply to wound bed as instructed Prim Dressing: Every Other Day/15 Days ary Secondary Dressing: Zetuvit Plus 4x4 in (DME) (Generic) Every Other Day/15 Days Discharge Instructions: Apply over primary dressing as directed. Add-Ons: Byram (DME) (Generic) Every Other Day/15 Days Add-Ons: Every Other Day/15 Days Electronic Signature(s) Signed: 05/07/2020 4:28:01 PM By: [name] Ham MD Signed: 05/10/2020 3:14:58 PM By: Sarah Hammock RN Entered By: [name] Khan on 05/07/2020 08:28:11 -------------------------------------------------------------------------------- Problem List Details Patient Name: Date of Service: [name] Khan V [name] 05/07/2020 7:30 [name] M Medical Record Number: [id number] Patient Account Number: [id number] Date of Birth/Sex: Treating RN: 1970/12/23 (50 y.o. [name] Khan, [name] Khan Primary Care Provider: Karle Khan Other Clinician: Referring Provider: Treating Provider/Extender: [name] Khan in Treatment: 4 Active Problems ICD-10 Encounter Code Description Active Date MDM Diagnosis 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 04/09/2020 No Yes T81.31XD Disruption of external operation (surgical) wound, not elsewhere classified, 04/09/2020 No Yes subsequent encounter 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 and subcutaneous tissue related to 04/09/2020 No Yes radiation Z90.10 Acquired  absence of unspecified breast and nipple 04/09/2020 No Yes Inactive Problems Resolved Problems Electronic Signature(s) Signed: 05/07/2020 4:28:01 PM By: [name] Ham MD Entered By: [name] Khan on 05/07/2020 08:22:24 -------------------------------------------------------------------------------- Progress Note Details Patient Name: Date of Service: [name] Khan V [name] 05/07/2020 7:30 [name] M Medical Record Number: [id number] Patient Account Number: [id number] Date of Birth/Sex: Treating RN: 09-Oct-1970 (51 y.o. [name] Khan, [name] Khan Primary Care Provider: Karle Khan Other Clinician: Referring Provider: Treating Provider/Extender: [name] Khan in Treatment: 4 Subjective History of Present Illness (HPI) ADMISSION 04/09/2020 [name] 50 year old woman who is history of breast cancer in the left breast goes back to 2018. He was found to have [name] mass in her left breast. She underwent [name] left back lumpectomy and then subsequent chemo that she did not tolerate very well. She had seed implants placed but then underwent external beam [name] radiation in the spring 2019. 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 Her adjunctive radiation was from 03/11/2017 through 04/21/2017. Unfortunately she had [name] left breast biopsy on 11/24/2019 and it was again positive for an invasive ductal carcinoma grade 3 he is to resume at receptor 80% positive. HER-2 negative progesterone negative. She opted for bilateral mastectomies which were done on 01/09/2020 on the right with no evidence of malignancy on the left stage IIb invasive ductal carcinoma. She again received chemotherapy but she did not tolerate it because of wound dehiscence and low platelet counts according to the patient. Unfortunately her mastectomy wound dehisced on the left breast. She was taken  back to the OR by [name] of general surgery on 33/24/22 and she underwent [name] debridement and subsequent attempt at closure. She had been previously treated with Augmentin for suspected underlying infection. Once again the wound has dehisced in the mid aspect. It is roughly 1 cm deep there is tunnels both medially and laterally she. She still has sutures in place. Still describes constant pain drainage. She also  has [name] drain still in place that drains about 10 cc/day. Currently she is changing ABD pad several times [name] day 4/12; patient went back to see [name] who remove most of the stitches and debrided her wound. She has been using silver alginate culture I did last time which was [name] PCR culture showed Peptostreptococcus, Enterococcus faecalis and Staphylococcus epidermidis which is of questionable significance. She has [name] larger wound now that the sutures have been removed. There are undermining areas superior medial superior lateral and [name] deep tunneling area in the inferior part of the wound. There is no obvious purulence. I started her on Augmentin on 04/12/2020 and I will likely extend this for another week AMAZINGLY; she is already been approved for HBO and will start today. [name] Echocardiogram which I reviewed from 2019 showed [name] normal ejection fraction.. I went over side effects of HBO with the patient and answered any of her questions. Her husband was present 5/3; its been [name] while since have seen this wound. She went to see [name] about 2-1/2 weeks ago according to the patient he can remove some of the surrounding debris. She will see him again later this week or early next week. She has been using silver alginate. [name] lot of this is problematic since she has Medicaid which traditionally does not cover wound care supplies although it traditionally does not cover HBO either Her wound actually looks quite [name] bit better than the last time I saw this better  looking surface healthy looking tissue. She has tunnels at 810 2 in 6 the deepest of these is at 2 at 2.2 cm there is no exposed bone. Objective Constitutional Sitting or standing Blood Pressure is within target range for patient.. Pulse regular and within target range for patient.Marland Kitchen Respirations regular, non-labored and within target range.. Temperature is normal and within the target range for the patient.Marland Kitchen Appears in no distress. Vitals Time Taken: 7:48 AM, Height: 67 in, Weight: 257 lbs, BMI: 40.2, Temperature: 98.0 F, Pulse: 99 bpm, Respiratory Rate: 18 breaths/min, Blood Pressure: 124/84 mmHg. General Notes: Wound exam; the patient has an open wound with depth. There is undermining tunnels at 810 2 and 6 the deepest of the toes is up to 2.2 cm. I used [name] #5 curette to remove some surface debris in general the granulation looks reasonably healthy. There is no evidence of surrounding infection Integumentary (Hair, Skin) Wound #1 status is Open. Original cause of wound was Surgical Injury. The date acquired was: 01/16/2020. The wound has been in treatment 4 weeks. The wound is located on the Left Chest. The wound measures 5.2cm length x 4.5cm width x 2.3cm depth; 18.378cm^2 area and 42.27cm^3 volume. There is Fat Layer (Subcutaneous Tissue) exposed. There is tunneling at 10:00 with [name] maximum distance of 1.8cm. There is additional tunneling at 2:00 with [name] maximum distance of 2.9cm, and at 9:00 with [name] maximum distance of 3.1cm. There is [name] medium amount of serosanguineous drainage noted. The wound margin is well defined and not attached to the wound base. There is medium (34-66%) red, pink granulation within the wound bed. There is [name] medium (34-66%) amount of necrotic tissue within the wound bed including Adherent Slough. Assessment Active Problems ICD-10 Intraductal carcinoma in situ of left breast Disruption of external operation (surgical) wound, not elsewhere classified, subsequent  encounter 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 Procedures Wound #1 Pre-procedure  diagnosis of Wound #1 is an Open Surgical Wound located on the Left Chest . There was [name] Excisional Skin/Subcutaneous Tissue Debridement with [name] total area of 23.4 sq cm performed by [name] Khan., MD. With the following instrument(s): Curette to remove Viable and Non-Viable tissue/material. Material removed includes Subcutaneous Tissue, Slough, and Skin: Dermis after achieving pain control using Lidocaine. No specimens were taken. [name] time out was conducted at 08:14, prior to the start of the procedure. [name] Minimum amount of bleeding was controlled with Pressure. The procedure was tolerated well with [name] pain level of 0 throughout and [name] pain level of 0 following the procedure. Post Debridement Measurements: 5.2cm length x 4.5cm width x 2.3cm depth; 42.27cm^3 volume. Character of Wound/Ulcer Post Debridement is improved. Post procedure Diagnosis Wound #1: Same as Pre-Procedure Plan Follow-up Appointments: Return appointment in 3 weeks. - Dr. Dellia Nims Nurse Visit: - 05/10 05/17 Bathing/ Shower/ Hygiene: May shower with protection but do not get wound dressing(s) wet. Hyperbaric Oxygen Therapy: Evaluate for HBO Therapy Indication: - soft tissue radionecrosis 2.5 ATA for 90 Minutes with 2 Five (5) Minute Air Breaks T Number of Treatments: - 40 otal One treatments per day (delivered Monday through Friday unless otherwise specified in Special Instructions below): Afrin (Oxymetazoline HCL) 0.05% nasal spray - 1 spray in both nostrils daily as needed prior to HBO treatment for difficulty clearing ears WOUND #1: - Chest Wound Laterality: Left Cleanser: Normal Saline (DME) (Generic) Every Other Day/15 Days Discharge Instructions: Cleanse the wound with Normal Saline prior to applying [name] clean dressing using gauze sponges, not  tissue or cotton balls. Prim Dressing: IODOFLEX 0.9% Cadexomer Iodine Pad 4x6 cm (DME) (Generic) Every Other Day/15 Days ary Discharge Instructions: Apply to wound bed as instructed Prim Dressing: Every Other Day/15 Days ary Secondary Dressing: Zetuvit Plus 4x4 in (DME) (Generic) Every Other Day/15 Days Discharge Instructions: Apply over primary dressing as directed. Add-Ons: Byram (DME) (Generic) Every Other Day/15 Days Add-Ons: Every Other Day/15 Days 1. She continues to tolerate HBO well although she had problems yesterday related to some transitory GI issue. She think she will be fine today 2. I am going to try to change her to Iodoflex for [name] couple of weeks to see if we can deal better with the surface debris which is [name] bit adherent. I do not think Santyl would be an alternative. Sorbact would be prohibitively expensive, she is paying for this out of pocket 3. I also had some thoughts early on about [name] VAC [name] advanced treatment product to stimulate the granulation. I probably like to try the [facility] given the tunneling. I will put this through her insurance. Again this was Medicaid but they are covering the HBO which is already unusual Electronic Signature(s) Signed: 05/07/2020 4:28:01 PM By: [name] Ham MD Entered By: [name] Khan on 05/07/2020 08:30:06 -------------------------------------------------------------------------------- SuperBill Details Patient Name: Date of Service: [name] 05/07/2020 Medical Record Number: [id number] Patient Account Number: [id number] Date of Birth/Sex: Treating RN: June 06, 1970 (50 y.o. 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 Disruption of external operation (surgical) wound, not  elsewhere classified, subsequent encounter 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 Facility Procedures CPT4 Code: [id number] Description: 41740 - DEB SUBQ TISSUE  20 SQ CM/< ICD-10 Diagnosis Description 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 and subcutaneous tissue related to radiat Modifier: ion Quantity: 1 CPT4 Code: [id number] Description: 06015 - DEB SUBQ TISS EA ADDL 20CM ICD-10 Diagnosis Description 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 and subcutaneous tissue related to radiat Modifier: ion Quantity: 1 Physician Procedures : CPT4 Code Description Modifier [id number] 11042 - WC PHYS SUBQ TISS 20 SQ CM ICD-10 Diagnosis Description 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 Quantity: 1 : [id number] 09295 - WC PHYS SUBQ TISS EA ADDL 20 CM ICD-10 Diagnosis Description 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 Quantity: 1 Electronic Signature(s) Signed: 05/07/2020 4:28:01 PM By: [name] Ham MD Entered By: [name] Khan on 05/07/2020 08:30:18

## 2020-05-10 NOTE — Progress Notes (Signed)
[name] ([id number]) Visit Report for 04/23/2020 HBO Details Patient Name: Date of Service: [name] [name] 04/23/2020 8:30 A M Medical Record Number: [id number] Patient Account Number: [id number] Date of Birth/Sex: Treating RN: 02-Jun-1970 (50 y.o. 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 HBO Treatment Details Treatment Number: 6 Patient Type: Outpatient Chamber Type: Monoplace Chamber Serial #: [id number] Treatment Protocol: 2.5 ATA with 90 minutes oxygen, with two 5 minute air breaks Treatment Details Compression Rate Down: 2.0 psi / minute De-Compression Rate Up: 2.0 psi / minute A breaks and breathing ir Compress Tx Pressure periods Decompress Decompress Begins Reached (leave unused spaces Begins Ends blank) Chamber Pressure (ATA 1 2.5 2.5 2.5 2.5 2.5 - - 2.5 1 ) Clock Time (24 hr) 08:26 08:38 09:08 09:13 09:43 09:48 - - 10:18 10:30 Treatment Length: 124 (minutes) Treatment Segments: 4 Vital Signs Capillary Blood Glucose Reference Range: 80 - 120 mg / dl HBO Diabetic Blood Glucose Intervention Range: <131 mg/dl or >249 mg/dl Time Vitals Blood Respiratory Capillary Blood Glucose Pulse Action Type: Pulse: Temperature: Taken: Pressure: Rate: Glucose (mg/dl): Meter #: Oximetry (%) Taken: Pre 08:05 126/85 88 18 98.3 Post 10:30 113/68 68 18 97.9 Treatment Response Treatment Completion Status: Treatment Completed without Adverse Event Electronic Signature(s) Signed: 04/25/2020 5:27:18 PM By: Levan Hurst RN, BSN Signed: 05/10/2020 4:10:40 PM By: Kalman Shan DO Entered By: Levan Hurst on 04/23/2020  11:17:38 -------------------------------------------------------------------------------- HBO Safety Checklist Details Patient Name: Date of Service: [name] [name] 04/23/2020 8:30 A M Medical Record Number: [id number] Patient Account Number: [id number] Date of Birth/Sex: Treating RN: [birth year] (50 y.o. [name] Primary Care [name]: [name] Other Clinician: Referring [name]: Treating [name]/Extender: [name] in Treatment: 2 HBO Safety Checklist Items Safety Checklist Consent Form Signed Patient voided / foley secured and emptied When did you last eato 0700 Last dose of injectable or oral agent na Ostomy pouch emptied and vented if applicable NA All implantable devices assessed, documented and approved Intravenous access site secured and place NA Valuables secured Linens and cotton and cotton/polyester blend (less than 51% polyester) Personal oil-based products / skin lotions / body lotions removed Wigs or hairpieces removed Smoking or tobacco materials removed Books / newspapers / magazines / loose paper removed Cologne, aftershave, perfume and deodorant removed Jewelry removed (may wrap wedding band) Make-up removed Hair care products removed Battery operated devices (external) removed Heating patches and chemical warmers removed Titanium eyewear removed Nail polish cured greater than 10 hours Casting material cured greater than 10 hours NA Hearing aids removed NA Loose dentures or partials removed Prosthetics have been removed NA Patient demonstrates correct use of air break device (if applicable) Patient concerns have been addressed Patient grounding bracelet on and cord attached to chamber Specifics for Inpatients (complete in addition to above) Medication sheet sent with patient NA Intravenous medications needed or due during therapy sent with patient NA Drainage tubes (e.g. nasogastric tube or  chest tube secured and vented) NA Endotracheal or Tracheotomy tube secured NA Cuff deflated of air and inflated with saline NA Airway suctioned NA Electronic Signature(s) Signed: 04/25/2020 5:27:18 PM By: Levan Hurst RN, BSN Entered By: Levan Hurst on 04/23/2020  11:16:41 

## 2020-05-10 NOTE — Progress Notes (Signed)
[name], [name] ([id number]) Visit Report for 04/23/2020 SuperBill Details Patient Name: Date of Service: [name] [name] 04/23/2020 Medical Record Number: [id number] Patient Account Number: [id number] Date of Birth/Sex: Treating RN: [birth year] (50 y.o. 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 Disruption of external operation (surgical) wound, not elsewhere classified, subsequent encounter 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 Facility Procedures CPT4 Code Description Modifier Quantity [id number] G0277-(Facility Use Only) HBOT full body chamber, 35min , 4 Physician Procedures Quantity CPT4 Code Description Modifier [id number] 03559 - WC PHYS HYPERBARIC OXYGEN THERAPY 1 ICD-10 Diagnosis Description 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

## 2020-05-12 ENCOUNTER — Other Ambulatory Visit: Payer: Self-pay | Admitting: Oncology

## 2020-05-13 ENCOUNTER — Encounter (HOSPITAL_BASED_OUTPATIENT_CLINIC_OR_DEPARTMENT_OTHER): Payer: Medicaid Other | Admitting: Internal Medicine

## 2020-05-13 ENCOUNTER — Other Ambulatory Visit: Payer: Self-pay

## 2020-05-13 ENCOUNTER — Other Ambulatory Visit (HOSPITAL_COMMUNITY): Payer: Self-pay

## 2020-05-13 DIAGNOSIS — C50412 Malignant neoplasm of upper-outer quadrant of left female breast: Secondary | ICD-10-CM | POA: Diagnosis not present

## 2020-05-13 MED ORDER — TRAMADOL HCL 50 MG PO TABS
ORAL_TABLET | Freq: Four times a day (QID) | ORAL | 0 refills | Status: DC | PRN
  Filled 2020-05-13: qty 20, 3d supply, fill #0

## 2020-05-13 NOTE — Progress Notes (Signed)
ARIES, KASA ([id number]) Visit Report for 05/10/2020 HBO Details Patient Name: Date of Service: [name]. 05/10/2020 8:00 [name] Medical Record Number: [id number] Patient Account Number: [id number] Date of Birth/Sex: Treating RN: Jan 31, 1970 (50 y.o. 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 HBO Treatment Details Treatment Number: 18 Patient Type: Outpatient Chamber Type: Monoplace Chamber Serial #: [id number] Treatment Protocol: 2.5 ATA with 90 minutes oxygen, with two 5 minute air breaks Treatment Details Compression Rate Down: 2.0 psi / minute De-Compression Rate Up: 2.0 psi / minute Sarah breaks and breathing ir Compress Tx Pressure periods Decompress Decompress Begins Reached (leave unused spaces Begins Ends blank) Chamber Pressure (ATA 1 2.5 2.5 2.5 2.5 2.5 - - 2.5 1 ) Clock Time (24 hr) 08:11 08:23 08:53 08:58 09:28 09:33 - - 10:03 10:15 Treatment Length: 124 (minutes) Treatment Segments: 4 Vital Signs Capillary Blood Glucose Reference Range: 80 - 120 mg / dl HBO Diabetic Blood Glucose Intervention Range: <131 mg/dl or >249 mg/dl Time Vitals Blood Respiratory Capillary Blood Glucose Pulse Action Type: Pulse: Temperature: Taken: Pressure: Rate: Glucose (mg/dl): Meter #: Oximetry (%) Taken: Pre 07:58 126/93 84 18 98.2 Post 10:15 124/65 77 18 98.1 Treatment Response Treatment Completion Status: Treatment Completed without Adverse Event Jabbar Palmero Notes No concerns with treatment given Physician HBO Attestation: I certify that I supervised this HBO treatment in accordance with Medicare guidelines. Sarah trained  emergency response team is readily available per Yes hospital policies and procedures. Continue HBOT as ordered. Yes Electronic Signature(s) Signed: 05/10/2020 4:39:58 PM By: [name] MD Entered By: [name] on 05/10/2020 16:01:02 -------------------------------------------------------------------------------- HBO Safety Checklist Details Patient Name: Date of Service: [name]. 05/10/2020 8:00 [name] Medical Record Number: [id number] Patient Account Number: [id number] Date of Birth/Sex: Treating RN: 28-Oct-1970 (50 y.o. [name] Primary Care Lateya Dauria: [name] Other Clinician: Referring Evin Chirco: Treating Soha Thorup/Extender: [name] in Treatment: 4 HBO Safety Checklist Items Safety Checklist Consent Form Signed Patient voided / foley secured and emptied When did you last eato 0700 Last dose of injectable or oral agent na Ostomy pouch emptied and vented if applicable NA All implantable devices assessed, documented and approved Intravenous access site secured and place NA Valuables secured Linens and cotton and cotton/polyester blend (less than 51% polyester) Personal oil-based products / skin lotions / body lotions removed Wigs or hairpieces removed Smoking or tobacco materials removed Books / newspapers / magazines / loose paper removed Cologne, aftershave, perfume and deodorant removed Jewelry removed (may wrap wedding band) Make-up removed Hair care products removed Battery operated devices (external) removed Heating patches and chemical warmers removed Titanium eyewear removed Nail polish cured greater than 10 hours Casting material cured greater than 10 hours NA Hearing aids removed NA Loose dentures or partials removed Prosthetics have been removed NA Patient demonstrates correct use of air break device (if applicable) Patient concerns have been addressed Patient grounding bracelet on and  cord attached to chamber Specifics for Inpatients (complete in addition to above) Medication sheet sent with patient NA Intravenous medications needed or due during therapy sent with patient NA Drainage tubes (e.g. nasogastric tube or chest tube secured and vented) NA Endotracheal or  Tracheotomy tube secured NA Cuff deflated of air and inflated with saline NA Airway suctioned NA Electronic Signature(s) Signed: 05/13/2020 5:39:20 PM By: Levan Hurst RN, BSN Entered By: Levan Hurst on 05/10/2020 10:55:12

## 2020-05-13 NOTE — Progress Notes (Signed)
DESTINAE, NEUBECKER ([id number]) Visit Report for 05/13/2020 HBO Details Patient Name: Date of Service: [name]. 05/13/2020 8:00 Sarah M Medical Record Number: [id number] Patient Account Number: [id number] Date of Birth/Sex: Treating RN: March 04, 1970 (50 y.o. 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 HBO Treatment Details Treatment Number: 19 Patient Type: Outpatient Chamber Type: Monoplace Chamber Serial #: [id number] Treatment Protocol: 2.5 ATA with 90 minutes oxygen, with two 5 minute air breaks Treatment Details Compression Rate Down: 2.0 psi / minute De-Compression Rate Up: 2.0 psi / minute Sarah breaks and breathing ir Compress Tx Pressure periods Decompress Decompress Begins Reached (leave unused spaces Begins Ends blank) Chamber Pressure (ATA 1 2.5 2.5 2.5 2.5 2.5 - - 2.5 1 ) Clock Time (24 hr) 08:06 08:18 08:48 08:53 09:23 09:28 - - 09:58 10:08 Treatment Length: 122 (minutes) Treatment Segments: 4 Vital Signs Capillary Blood Glucose Reference Range: 80 - 120 mg / dl HBO Diabetic Blood Glucose Intervention Range: <131 mg/dl or >249 mg/dl Time Vitals Blood Respiratory Capillary Blood Glucose Pulse Action Type: Pulse: Temperature: Taken: Pressure: Rate: Glucose (mg/dl): Meter #: Oximetry (%) Taken: Pre 07:55 145/81 91 18 98.6 Post 10:10 119/55 71 18 98.2 Treatment Response Treatment Toleration: Well Treatment Completion Status: Treatment Completed without Adverse Event Boykin Baetz Notes No concerns with treatment given Physician HBO Attestation: I certify that I supervised this HBO treatment in accordance with  Medicare guidelines. Sarah trained emergency response team is readily available per Yes hospital policies and procedures. Continue HBOT as ordered. Yes Electronic Signature(s) Signed: 05/13/2020 4:39:47 PM By: [name] MD Entered By: [name] on 05/13/2020 15:30:12 -------------------------------------------------------------------------------- HBO Safety Checklist Details Patient Name: Date of Service: [name] V [name]. 05/13/2020 8:00 Sarah M Medical Record Number: [id number] Patient Account Number: [id number] Date of Birth/Sex: Treating RN: 1970/12/24 (50 y.o. [name], [name] Primary Care Stephie Xu: [name] Other Clinician: Referring Cathy Ropp: Treating Lateya Dauria/Extender: [name] in Treatment: 4 HBO Safety Checklist Items Safety Checklist Consent Form Signed Patient voided / foley secured and emptied When did you last eato last night Last dose of injectable or oral agent n/Sarah Ostomy pouch emptied and vented if applicable NA All implantable devices assessed, documented and approved NA Intravenous access site secured and place NA Valuables secured Linens and cotton and cotton/polyester blend (less than 51% polyester) Personal oil-based products / skin lotions / body lotions removed NA Wigs or hairpieces removed NA Smoking or tobacco materials removed NA Books / newspapers / magazines / loose paper removed NA Cologne, aftershave, perfume and deodorant removed NA Jewelry removed (may wrap wedding band) Make-up removed NA Hair care products removed NA Battery operated devices (external) removed NA Heating patches and chemical warmers removed NA Titanium eyewear removed Nail polish cured greater than 10 hours Casting material cured greater than 10 hours NA Hearing aids removed NA Loose dentures or partials removed Prosthetics have been removed NA Patient demonstrates correct use of air break device (if  applicable) Patient concerns have been addressed Patient grounding bracelet on and cord attached to chamber Specifics for Inpatients (complete in addition to above) Medication sheet sent with patient Intravenous medications needed or due during therapy sent with patient Drainage tubes (  e.g. nasogastric tube or chest tube secured and vented) Endotracheal or Tracheotomy tube secured Cuff deflated of air and inflated with saline Airway suctioned Electronic Signature(s) Signed: 05/13/2020 6:03:56 PM By: Deon Pilling Entered By: Deon Pilling on 05/13/2020 10:36:53

## 2020-05-13 NOTE — Progress Notes (Signed)
CANDIS, KABEL ([id number]) Visit Report for 05/13/2020 SuperBill Details Patient Name: Date of Service: [name] 05/13/2020 Medical Record Number: [id number] Patient Account Number: [id number] Date of Birth/Sex: Treating RN: 09-12-1970 (50 y.o. 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 Disruption of external operation (surgical) wound, not elsewhere classified, subsequent encounter 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 Facility Procedures CPT4 Code Description Modifier Quantity [id number] G0277-(Facility Use Only) HBOT full body chamber, 67min , 4 Physician Procedures Quantity CPT4 Code Description Modifier [id number] 82993 - WC PHYS HYPERBARIC OXYGEN THERAPY 1 ICD-10 Diagnosis Description 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 T81.31XD Disruption of external operation (surgical) wound, not elsewhere classified, subsequent encounter Electronic Signature(s) Signed: 05/13/2020 4:39:47 PM By: [name] MD Signed: 05/13/2020 6:03:56 PM By: Deon Pilling Entered By: Deon Pilling on 05/13/2020 10:39:03

## 2020-05-13 NOTE — Progress Notes (Signed)
MADLYNN, LUNDEEN ([id number]) Visit Report for 05/10/2020 Arrival Information Details Patient Name: Date of Service: [name] 05/10/2020 8:00 A M Medical Record Number: [id number] Patient Account Number: [id number] Date of Birth/Sex: Treating RN: Jun 30, 1970 (50 y.o. [name], [name] Primary Care Pleasant Britz: [name] Other Clinician: Referring Marnee Sherrard: Treating Omid Deardorff/Extender: [name] in Treatment: 4 Visit Information History Since Last Visit Added or deleted any medications: No Patient Arrived: Ambulatory Any new allergies or adverse reactions: No Arrival Time: 07:58 Had a fall or experienced change in No Accompanied By: alone activities of daily living that may affect Transfer Assistance: None risk of falls: Patient Identification Verified: Yes Signs or symptoms of abuse/neglect since last visito No Secondary Verification Process Completed: Yes Hospitalized since last visit: No Patient Requires Transmission-Based Precautions: No Implantable device outside of the clinic excluding No Patient Has Alerts: No cellular tissue based products placed in the center since last visit: Has Dressing in Place as Prescribed: Yes Pain Present Now: No Electronic Signature(s) Signed: 05/13/2020 5:39:20 PM By: Levan Hurst RN, BSN Entered By: Levan Hurst on 05/10/2020 10:07:27 -------------------------------------------------------------------------------- Encounter Discharge Information Details Patient Name: Date of Service: [name] 05/10/2020 8:00 A M Medical Record Number: [id number] Patient Account Number: [id number] Date of Birth/Sex: Treating RN: [birth year] (50 y.o. [name] Primary Care Day Greb: [name] Other Clinician: Referring Laketia Vicknair: Treating Christinea Brizuela/Extender: [name] in Treatment: 4 Encounter Discharge Information Items Discharge Condition:  Stable Ambulatory Status: Ambulatory Discharge Destination: Home Transportation: Private Auto Accompanied By: alone Schedule Follow-up Appointment: Yes Clinical Summary of Care: Patient Declined Electronic Signature(s) Signed: 05/13/2020 5:39:20 PM By: Levan Hurst RN, BSN Entered By: Levan Hurst on 05/10/2020 10:57:14 -------------------------------------------------------------------------------- Vitals Details Patient Name: Date of Service: [name] 05/10/2020 8:00 A M Medical Record Number: [id number] Patient Account Number: [id number] Date of Birth/Sex: Treating RN: 11-May-1970 (50 y.o. [name] Primary Care Aquarius Latouche: [name] Other Clinician: Referring Catalena Stanhope: Treating Shakeia Krus/Extender: [name] in Treatment: 4 Vital Signs Time Taken: 07:58 Temperature (F): 98.2 Height (in): 67 Pulse (bpm): 84 Weight (lbs): 257 Respiratory Rate (breaths/min): 18 Body Mass Index (BMI): 40.2 Blood Pressure (mmHg): 126/93 Reference Range: 80 - 120 mg / dl Electronic Signature(s) Signed: 05/13/2020 5:39:20 PM By: Levan Hurst RN, BSN Entered By: Levan Hurst on 05/10/2020 10:07:49

## 2020-05-13 NOTE — Progress Notes (Signed)
MARQUETTA, WEISKOPF ([id number]) Visit Report for 05/10/2020 SuperBill Details Patient Name: Date of Service: [name] 05/10/2020 Medical Record Number: [id number] Patient Account Number: [id number] Date of Birth/Sex: Treating RN: [birth year] (50 y.o. 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 Disruption of external operation (surgical) wound, not elsewhere classified, subsequent encounter 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 Facility Procedures CPT4 Code Description Modifier Quantity [id number] G0277-(Facility Use Only) HBOT full body chamber, 10min , 4 Physician Procedures Quantity CPT4 Code Description Modifier [id number] 01007 - WC PHYS HYPERBARIC OXYGEN THERAPY 1 ICD-10 Diagnosis Description 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

## 2020-05-13 NOTE — Progress Notes (Signed)
HENREITTA, SPITTLER ([id number]) Visit Report for 05/13/2020 Arrival Information Details Patient Name: Date of Service: [name] [name] 05/13/2020 8:00 [name] Medical Record Number: [id number] Patient Account Number: [id number] Date of Birth/Sex: Treating RN: 1970-04-21 (50 y.o. [name], [name] Primary Care Loreena Valeri: [name] Other Clinician: Referring Makila Colombe: Treating Derek Huneycutt/Extender: [name] in Treatment: 4 Visit Information History Since Last Visit Added or deleted any medications: No Patient Arrived: Ambulatory Any new allergies or adverse reactions: No Arrival Time: 07:55 Had Sarah fall or experienced change in No Accompanied By: self activities of daily living that may affect Transfer Assistance: None risk of falls: Patient Identification Verified: Yes Signs or symptoms of abuse/neglect since last visito No Secondary Verification Process Completed: Yes Hospitalized since last visit: No Patient Requires Transmission-Based Precautions: No Implantable device outside of the clinic excluding No Patient Has Alerts: No cellular tissue based products placed in the center since last visit: Has Dressing in Place as Prescribed: Yes Pain Present Now: No Electronic Signature(s) Signed: 05/13/2020 6:03:56 PM By: Deon Pilling Entered By: Deon Pilling on 05/13/2020 10:36:04 -------------------------------------------------------------------------------- Encounter Discharge Information Details Patient Name: Date of Service: [name] [name] 05/13/2020 8:00 [name] Medical Record Number: [id number] Patient Account Number: [id number] Date of Birth/Sex: Treating RN: May 26, 1970 (50 y.o. [name] Primary Care Robby Bulkley: [name] Other Clinician: Referring Paz Winsett: Treating Selene Peltzer/Extender: [name] in Treatment: 4 Encounter Discharge Information Items Discharge Condition: Stable Ambulatory Status:  Ambulatory Discharge Destination: Home Transportation: Private Auto Accompanied By: self Schedule Follow-up Appointment: Yes Clinical Summary of Care: Electronic Signature(s) Signed: 05/13/2020 6:03:56 PM By: Deon Pilling Entered By: Deon Pilling on 05/13/2020 10:39:29 -------------------------------------------------------------------------------- Vitals Details Patient Name: Date of Service: [name], [name] [name] 05/13/2020 8:00 [name] Medical Record Number: [id number] Patient Account Number: [id number] Date of Birth/Sex: Treating RN: 1970/04/26 (50 y.o. [name], [name] Primary Care Lacole Komorowski: [name] Other Clinician: Referring Evelean Bigler: Treating Trenda Corliss/Extender: [name] in Treatment: 4 Vital Signs Time Taken: 07:55 Temperature (F): 98.6 Height (in): 67 Pulse (bpm): 91 Weight (lbs): 257 Respiratory Rate (breaths/min): 18 Body Mass Index (BMI): 40.2 Blood Pressure (mmHg): 145/81 Reference Range: 80 - 120 mg / dl Electronic Signature(s) Signed: 05/13/2020 6:03:56 PM By: Deon Pilling Entered By: Deon Pilling on 05/13/2020 10:36:21

## 2020-05-14 ENCOUNTER — Encounter (HOSPITAL_BASED_OUTPATIENT_CLINIC_OR_DEPARTMENT_OTHER): Payer: Medicaid Other | Admitting: Internal Medicine

## 2020-05-14 ENCOUNTER — Other Ambulatory Visit: Payer: Self-pay

## 2020-05-14 DIAGNOSIS — C50412 Malignant neoplasm of upper-outer quadrant of left female breast: Secondary | ICD-10-CM | POA: Diagnosis not present

## 2020-05-14 NOTE — Progress Notes (Signed)
[name], [name] ([id number]) Visit Report for 05/14/2020 Debridement Details Patient Name: Date of Service: [name] [name]. 05/14/2020 7:30 Sarah M Medical Record Number: [id number] Patient Account Number: [id number] Date of Birth/Sex: Treating RN: Dec 06, 1970 (50 y.o. [name] Primary Care Provider: [name] Other Clinician: Referring Provider: Treating Provider/Extender: [name] in Treatment: 5 Debridement Performed for Assessment: Wound #1 Left Chest Performed By: Physician Ricard Dillon., MD Debridement Type: Debridement Level of Consciousness (Pre-procedure): Awake and Alert Pre-procedure Verification/Time Out Yes - 07:59 Taken: Start Time: 08:00 Pain Control: Other : Benzocaine T Area Debrided (L x W): otal 5 (cm) x 4.5 (cm) = 22.5 (cm) Tissue and other material debrided: Non-Viable, Slough, Fibrin/Exudate, Slough Level: Non-Viable Tissue Debridement Description: Selective/Open Wound Instrument: Curette Bleeding: Minimum Hemostasis Achieved: Pressure End Time: 08:07 Response to Treatment: Procedure was tolerated well Level of Consciousness (Post- Awake and Alert procedure): Post Debridement Measurements of Total Wound Length: (cm) 5 Width: (cm) 4.5 Depth: (cm) 2.3 Volume: (cm) 40.644 Character of Wound/Ulcer Post Debridement: Stable Post Procedure Diagnosis Same as Pre-procedure Electronic Signature(s) Signed: 05/14/2020 4:50:17 PM By: [name] MD Signed: 05/14/2020 6:10:24 PM By: Deon Pilling Entered By: [name] on 05/14/2020 08:20:31 -------------------------------------------------------------------------------- HPI Details Patient Name: Date of Service: [name] [name]. 05/14/2020 7:30 Sarah M Medical Record Number: [id number] Patient Account Number: [id number] Date of Birth/Sex: Treating RN: [birth year] (50 y.o. [name] Primary Care Provider: [name] Other  Clinician: Referring Provider: Treating Provider/Extender: [name] in Treatment: 5 History of Present Illness HPI Description: ADMISSION 04/09/2020 [name] 50 year old woman who is history of breast cancer in the left breast goes back to 2018. He was found to have Sarah mass in her left breast. [name] and then subsequent chemo that she did not tolerate very well. She had seed implants placed but then underwent external beam Sarah radiation in the spring 2019. 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 Her adjunctive radiation was from 03/11/2017 through 04/21/2017. Unfortunately she had Sarah left breast biopsy on 11/24/2019 and it was again positive for an invasive ductal carcinoma grade 3 he is to resume at receptor 80% positive. HER-2 negative progesterone negative. She opted for bilateral mastectomies which were done on 01/09/2020 on the right with no evidence of malignancy on the left stage IIb invasive ductal carcinoma. She again received chemotherapy but she did not tolerate it because of wound dehiscence and low platelet counts according to the patient. Unfortunately her mastectomy wound dehisced on the left breast. She was taken back to the OR by [name] of general surgery on 33/24/22 and she underwent Sarah debridement and subsequent attempt at closure. She had been previously treated with Augmentin for suspected underlying infection. Once again the wound has dehisced in the mid aspect. It is roughly 1 cm deep there is tunnels both medially and laterally she. She still has sutures in place. Still describes constant pain drainage. She also has Sarah drain still in place that drains about 10 cc/day. Currently she is changing ABD pad several times Sarah day 4/12; patient went back to see [name] who remove most of  the stitches and debrided her wound. She has been using silver alginate culture I did last time which was Sarah PCR culture  showed Peptostreptococcus, Enterococcus faecalis and Staphylococcus epidermidis which is of questionable significance. [name] that the sutures have been removed. There are undermining areas superior medial superior lateral and Sarah deep tunneling area in the inferior part of the wound. There is no obvious purulence. I started her on Augmentin on 04/12/2020 and I will likely extend this for another week AMAZINGLY; she is already been approved for HBO and will start today. [name] Echocardiogram which I reviewed from 2019 showed Sarah normal ejection fraction.. I went over side effects of HBO with the patient and answered any of her questions. Her husband was present 5/3; [name]. She went to see [name] about 2-1/2 weeks ago according to the patient he can remove some of the surrounding debris. She will see him again later this week or early next week. She has been using silver alginate. Sarah lot of this is problematic since she has Medicaid which traditionally does not cover wound care supplies although it traditionally does not cover HBO either Her wound actually looks quite Sarah bit better than the last time I saw this better looking surface healthy looking tissue. She has tunnels at 810 2 in 6 the deepest of these is at 2 at 2.2 cm there is no exposed bone. 5/10; the patient was coming in for nurse visit today but requested that I see the wound because of surface discoloration. She states the Iodoflex makes the surface of this turned "white". She is not really complaining of undue tenderness. She is changing this every second day. The goal of this had been to get the surface of the wound to Sarah vibrant red color and then potentially look at changing to Sarah silver collagen-based dressing, Puraply, and then perhaps an  alternative skin substituteo Derma vest. The patient tells me she has an appoint with her oncologist [name] on the 19th. She apparently think she is going to get another PET scan. Based on this I would like his comment about whether there is possibility of any malignancy in the wound itself or just underneath it. Up to now we have not really thought that was true Electronic Signature(s) Signed: 05/14/2020 4:50:17 PM By: [name] MD Entered By: [name] on 05/14/2020 08:23:47 -------------------------------------------------------------------------------- Physical Exam Details Patient Name: Date of Service: [name] [name]. 05/14/2020 7:30 Sarah M Medical Record Number: [id number] Patient Account Number: [id number] Date of Birth/Sex: Treating RN: January 20, 1970 (50 y.o. [name] Primary Care Provider: [name] Other Clinician: Referring Provider: Treating Provider/Extender: [name] in Treatment: 5 Notes Wound exam; there is no evidence of surrounding infection still the same tunnels. Surface of the wound had slough and adherent debris I used an open curette I did not dip into subcutaneous tissue but managed to clean the wound surface up Electronic Signature(s) Signed: 05/14/2020 4:50:17 PM By: [name] MD Entered By: [name] on 05/14/2020 08:24:34 -------------------------------------------------------------------------------- Physician Orders Details Patient Name: Date of Service: [name] [name]. 05/14/2020 7:30 Sarah M Medical Record Number: [id number] Patient Account Number: [id number] Date of Birth/Sex: Treating RN: [birth year] (50 y.o. [name] Primary Care Provider: Other Clinician: [name] Referring Provider: Treating Provider/Extender: [name] in Treatment: 5 Verbal / Phone Orders: No Diagnosis Coding Follow-up Appointments ppointment in 1  week. - with Dr. Dellia Nims Return Sarah Bathing/ Shower/ Hygiene  May shower with protection but do not get wound dressing(s) wet. Additional Orders / Instructions Follow Nutritious Diet Hyperbaric Oxygen Therapy Evaluate for HBO Therapy Indication: - soft tissue radionecrosis 2.5 ATA for 90 Minutes with 2 Five (5) Minute Sarah Breaks ir Total Number of Treatments: - 40 One treatments per day (delivered Monday through Friday unless otherwise specified in Special Instructions below): Afrin (Oxymetazoline HCL) 0.05% nasal spray - 1 spray in both nostrils daily as needed prior to HBO treatment for difficulty clearing ears Wound Treatment Wound #1 - Chest Wound Laterality: Left Cleanser: Normal Saline (Generic) 1 x Per Day/15 Days Discharge Instructions: Cleanse the wound with Normal Saline prior to applying Sarah clean dressing using gauze sponges, not tissue or cotton balls. Prim Dressing: IODOFLEX 0.9% Cadexomer Iodine Pad 4x6 cm (Generic) 1 x Per Day/15 Days ary Discharge Instructions: Apply to wound bed as instructed Prim Dressing: 1 x Per Day/15 Days ary Secondary Dressing: Zetuvit Plus 4x4 in (Generic) 1 x Per Day/15 Days Discharge Instructions: Apply over primary dressing as directed. Add-Ons: Byram (Generic) 1 x Per Day/15 Days Add-Ons: 1 x Per OJJ/00 Days Electronic Signature(s) Signed: 05/14/2020 4:50:17 PM By: [name] MD Signed: 05/14/2020 5:40:35 PM By: Lorrin Jackson Entered By: Lorrin Jackson on 05/14/2020 08:22:50 -------------------------------------------------------------------------------- Problem List Details Patient Name: Date of Service: [name] [name]. 05/14/2020 7:30 Sarah M Medical Record Number: [id number] Patient Account Number: [id number] Date of Birth/Sex: Treating RN: Jun 22, 1970 (50 y.o. 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 04/09/2020 No Yes T81.31XD Disruption of external operation (surgical) wound, not elsewhere classified, 04/09/2020 No Yes subsequent encounter 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 and subcutaneous tissue related to 04/09/2020 No Yes radiation Z90.10 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 04/09/2020 No Yes Inactive Problems Resolved Problems Electronic Signature(s) Signed: 05/14/2020 4:50:17 PM By: [name] MD Entered By: [name] on 05/14/2020 08:20:09 -------------------------------------------------------------------------------- Progress Note Details Patient Name: Date of Service: [name] [name]. 05/14/2020 7:30 Sarah M Medical Record Number: [id number] Patient Account Number: [id number] Date of Birth/Sex: Treating RN: 1970-04-26 (50 y.o. [name] Primary Care Provider: [name] Other Clinician: Referring Provider: Treating Provider/Extender: [name] in Treatment: 5 Subjective History of Present Illness (HPI) ADMISSION 04/09/2020 [name] 50 year old woman who is history of breast cancer in the left breast goes back to 2018. He was found to have Sarah mass in her left breast. [name] and then subsequent chemo that she did not tolerate very well. She had seed implants placed but then underwent external beam Sarah radiation in the spring 2019. 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 Her adjunctive radiation was from 03/11/2017 through 04/21/2017. Unfortunately she had Sarah left breast biopsy on 11/24/2019 and it was again positive for an invasive ductal carcinoma grade 3 he is to resume at receptor 80% positive. HER-2  negative progesterone negative. She opted for bilateral mastectomies which were done on 01/09/2020 on the right with no evidence of malignancy on the left stage IIb invasive ductal carcinoma. She again received chemotherapy but she did not tolerate it because of wound  dehiscence and low platelet counts according to the patient. Unfortunately her mastectomy wound dehisced on the left breast. She was taken back to the OR by [name] of general surgery on 33/24/22 and she underwent Sarah debridement and subsequent attempt at closure. She had been previously treated with Augmentin for suspected underlying infection. Once again the wound has dehisced in the mid aspect. It is roughly 1 cm deep there is tunnels both medially and laterally she. She still has sutures in place. Still describes constant pain drainage. She also has Sarah drain still in place that drains about 10 cc/day. Currently she is changing ABD pad several times Sarah day 4/12; patient went back to see [name] who remove most of the stitches and debrided her wound. She has been using silver alginate culture I did last time which was Sarah PCR culture showed Peptostreptococcus, Enterococcus faecalis and Staphylococcus epidermidis which is of questionable significance. [name] that the sutures have been removed. There are undermining areas superior medial superior lateral and Sarah deep tunneling area in the inferior part of the wound. There is no obvious purulence. I started her on Augmentin on 04/12/2020 and I will likely extend this for another week AMAZINGLY; she is already been approved for HBO and will start today. [name] Echocardiogram which I reviewed from 2019 showed Sarah normal ejection fraction.. I went over side effects of HBO with the patient and answered any of her questions. Her husband was present 5/3; [name]. She went to see [name] about 2-1/2 weeks ago  according to the patient he can remove some of the surrounding debris. She will see him again later this week or early next week. She has been using silver alginate. Sarah lot of this is problematic since she has Medicaid which traditionally does not cover wound care supplies although it traditionally does not cover HBO either Her wound actually looks quite Sarah bit better than the last time I saw this better looking surface healthy looking tissue. She has tunnels at 810 2 in 6 the deepest of these is at 2 at 2.2 cm there is no exposed bone. 5/10; the patient was coming in for nurse visit today but requested that I see the wound because of surface discoloration. She states the Iodoflex makes the surface of this turned "white". She is not really complaining of undue tenderness. She is changing this every second day. The goal of this had been to get the surface of the wound to Sarah vibrant red color and then potentially look at changing to Sarah silver collagen-based dressing, Puraply, and then perhaps an alternative skin substituteo Derma vest. The patient tells me she has an appoint with her oncologist [name] on the 19th. She apparently think she is going to get another PET scan. Based on this I would like his comment about whether there is possibility of any malignancy in the wound itself or just underneath it. Up to now we have not really thought that was true Objective Constitutional Vitals Time Taken: 7:49 AM, Height: 67 in, Weight: 257 lbs, BMI: 40.2, Temperature: 97.9 F, Pulse: 92 bpm, Respiratory Rate: 18 breaths/min, Blood Pressure: 125/86 mmHg. Integumentary (Hair, Skin) Wound #1 status is Open. Original cause of wound was Surgical Injury. The date acquired was: 01/16/2020. The wound has been in treatment 5 weeks. The wound is located on the Left Chest. The wound measures 5cm length x 4.5cm  width x 2.3cm depth; 17.671cm^2 area and 40.644cm^3 volume. There is Fat Layer (Subcutaneous Tissue)  exposed. There is no undermining noted, however, there is tunneling at 11:00 with Sarah maximum distance of 1.5cm. There is additional tunneling at 9:00 with Sarah maximum distance of 3cm, and at 2:00 with Sarah maximum distance of 2.6cm. There is Sarah medium amount of serosanguineous drainage noted. The wound margin is well defined and not attached to the wound base. There is medium (34-66%) red, pink granulation within the wound bed. There is Sarah medium (34-66%) amount of necrotic tissue within the wound bed including Adherent Slough. Assessment Active Problems ICD-10 Intraductal carcinoma in situ of left breast Disruption of external operation (surgical) wound, not elsewhere classified, subsequent encounter 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 Procedures Wound #1 Pre-procedure diagnosis of Wound #1 is an Open Surgical Wound located on the Left Chest . There was Sarah Selective/Open Wound Non-Viable Tissue Debridement with Sarah total area of 22.5 sq cm performed by Ricard Dillon., MD. With the following instrument(s): Curette to remove Non-Viable tissue/material. Material removed includes Slough and Fibrin/Exudate and after achieving pain control using Other (Benzocaine). No specimens were taken. Sarah time out was conducted at 07:59, prior to the start of the procedure. Sarah Minimum amount of bleeding was controlled with Pressure. The procedure was tolerated well. Post Debridement Measurements: 5cm length x 4.5cm width x 2.3cm depth; 40.644cm^3 volume. Character of Wound/Ulcer Post Debridement is stable. Post procedure Diagnosis Wound #1: Same as Pre-Procedure Plan Follow-up Appointments: Return Appointment in 1 week. - with Dr. Arcola Jansky Shower/ Hygiene: May shower with protection but do not get wound dressing(s) wet. Additional Orders / Instructions: Follow Nutritious Diet Hyperbaric Oxygen Therapy: Evaluate  for HBO Therapy Indication: - soft tissue radionecrosis 2.5 ATA for 90 Minutes with 2 Five (5) Minute Air Breaks T Number of Treatments: - 40 otal One treatments per day (delivered Monday through Friday unless otherwise specified in Special Instructions below): Afrin (Oxymetazoline HCL) 0.05% nasal spray - 1 spray in both nostrils daily as needed prior to HBO treatment for difficulty clearing ears WOUND #1: - Chest Wound Laterality: Left Cleanser: Normal Saline (Generic) 1 x Per Day/15 Days Discharge Instructions: Cleanse the wound with Normal Saline prior to applying Sarah clean dressing using gauze sponges, not tissue or cotton balls. Prim Dressing: IODOFLEX 0.9% Cadexomer Iodine Pad 4x6 cm (Generic) 1 x Per Day/15 Days ary Discharge Instructions: Apply to wound bed as instructed Prim Dressing: 1 x Per Day/15 Days ary Secondary Dressing: Zetuvit Plus 4x4 in (Generic) 1 x Per Day/15 Days Discharge Instructions: Apply over primary dressing as directed. Add-Ons: Byram (Generic) 1 x Per Day/15 Days Add-Ons: 1 x Per Day/15 Days 1. For now I would like to continue with the Iodoflex I have asked him to clean this out and change the dressing daily 2. If I can get the surface of this to look reasonably healthy then changed to Sarah Prisma base dressing as soon as we reach that goal. I would wonder about Puraply and then progression to an alternative skin substitute 3. I would like to take her next visit with the oncologist as an opportunity to make sure there is no cancer in this wound based on assessment, experience ando PET scan 4. The wound looked quite good after debridement from Sarah surface point of view. There has not been any improvement in the tunneling or depth but the surface  of this certainly looks Sarah lot better with hyperbarics. I will try to see her back on Sarah weekly basis Electronic Signature(s) Signed: 05/14/2020 4:50:17 PM By: [name] MD Entered By: [name] on 05/14/2020  08:26:08 -------------------------------------------------------------------------------- SuperBill Details Patient Name: Date of Service: [name]. 05/14/2020 Medical Record Number: [id number] Patient Account Number: [id number] Date of Birth/Sex: Treating RN: April 07, 1970 (50 y.o. [name] Primary Care Provider: [name] Other Clinician: Referring Provider: Treating Provider/Extender: [name] in Treatment: 5 Diagnosis Coding ICD-10 Codes Code Description [phone] Intraductal carcinoma in situ of left breast T81.31XD Disruption of external operation (surgical) wound, not elsewhere classified, subsequent encounter 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 Facility Procedures CPT4 Code: [id number] Description: 73730 - DEBRIDE WOUND 1ST 20 SQ CM OR < ICD-10 Diagnosis Description 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 Modifier: Quantity: 1 CPT4 Code: [id number] Description: 65826 - DEBRIDE WOUND EA ADDL 20 SQ CM ICD-10 Diagnosis Description 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

## 2020-05-14 NOTE — Progress Notes (Signed)
SUTTON, PLAKE ([id number]) Visit Report for 05/02/2020 SuperBill Details Patient Name: Date of Service: [name]. 05/02/2020 Medical Record Number: [id number] Patient Account Number: [id number] Date of Birth/Sex: Treating RN: 02-26-1970 (50 y.o. [name] Primary Care Provider: [name] Other Clinician: Referring Provider: Treating Provider/Extender: [name] in Treatment: 3 Diagnosis Coding ICD-10 Codes Code Description [phone] Intraductal carcinoma in situ of left breast T81.31XD Disruption of external operation (surgical) wound, not elsewhere classified, subsequent encounter 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 Facility Procedures CPT4 Code Description Modifier Quantity [id number] G0277-(Facility Use Only) HBOT full body chamber, 65min , 4 Physician Procedures Quantity CPT4 Code Description Modifier [id number] 22633 - WC PHYS HYPERBARIC OXYGEN THERAPY 1 ICD-10 Diagnosis Description 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

## 2020-05-14 NOTE — Progress Notes (Signed)
[name], [name] ([id number]) Visit Report for 05/01/2020 SuperBill Details Patient Name: Date of Service: [name] 05/01/2020 Medical Record Number: [id number] Patient Account Number: [id number] Date of Birth/Sex: Treating RN: 11-Aug-1970 (50 y.o. 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 Disruption of external operation (surgical) wound, not elsewhere classified, subsequent encounter 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 Facility Procedures CPT4 Code Description Modifier Quantity [id number] G0277-(Facility Use Only) HBOT full body chamber, 56min , 4 Physician Procedures Quantity CPT4 Code Description Modifier [id number] 15176 - WC PHYS HYPERBARIC OXYGEN THERAPY 1 ICD-10 Diagnosis Description 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

## 2020-05-14 NOTE — Progress Notes (Signed)
EARLE, BURSON ([id number]) Visit Report for 04/29/2020 SuperBill Details Patient Name: Date of Service: [name] 04/29/2020 Medical Record Number: [id number] Patient Account Number: [id number] Date of Birth/Sex: Treating RN: 18-Apr-1970 (50 y.o. 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

## 2020-05-14 NOTE — Progress Notes (Signed)
GOLA, BRIBIESCA ([id number]) Visit Report for 04/30/2020 HBO Details Patient Name: Date of Service: [name] 04/30/2020 8:00 A M Medical Record Number: [id number] Patient Account Number: [id number] Date of Birth/Sex: Treating RN: 1970-06-30 (50 y.o. 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 HBO Treatment Details Treatment Number: 11 Patient Type: Outpatient Chamber Type: Monoplace Chamber Serial #: [id number] Treatment Protocol: 2.5 ATA with 90 minutes oxygen, with two 5 minute air breaks Treatment Details Compression Rate Down: 2.0 psi / minute De-Compression Rate Up: 2.0 psi / minute A breaks and breathing ir Compress Tx Pressure periods Decompress Decompress Begins Reached (leave unused spaces Begins Ends blank) Chamber Pressure (ATA 1 2.5 2.5 2.5 2.5 2.5 - - 2.5 1 ) Clock Time (24 hr) 08:08 09:19 09:49 08:54 09:24 09:29 - - 09:59 10:10 Treatment Length: 122 (minutes) Treatment Segments: 4 Vital Signs Capillary Blood Glucose Reference Range: 80 - 120 mg / dl HBO Diabetic Blood Glucose Intervention Range: <131 mg/dl or >249 mg/dl Time Vitals Blood Respiratory Capillary Blood Glucose Pulse Action Type: Pulse: Temperature: Taken: Pressure: Rate: Glucose (mg/dl): Meter #: Oximetry (%) Taken: Pre 07:47 127/87 92 16 98.6 Post 10:11 114/75 77 16 97.8 Treatment Response Treatment Toleration: Well Treatment Completion Status: Treatment Completed without Adverse Event Electronic Signature(s) Signed: 04/30/2020 6:08:37 PM By: Deon Pilling Signed: 05/14/2020 3:23:56 PM By: Kalman Shan DO Entered By: Deon Pilling on 04/30/2020 10:41:50 -------------------------------------------------------------------------------- HBO Safety Checklist Details Patient Name: Date of Service: [name] 04/30/2020 8:00 A M Medical Record Number: [id number] Patient Account Number: [id number] Date of Birth/Sex: Treating RN: [birth year] (50 y.o. [name], [name] Primary Care Dam Ashraf: [name] Other Clinician: Referring Jace Fermin: Treating Kohner Orlick/Extender: [name] in Treatment: 3 HBO Safety Checklist Items Safety Checklist Consent Form Signed Patient voided / foley secured and emptied When did you last eato this morning Last dose of injectable or oral agent n/a Ostomy pouch emptied and vented if applicable NA All implantable devices assessed, documented and approved NA Intravenous access site secured and place NA Valuables secured Linens and cotton and cotton/polyester blend (less than 51% polyester) Personal oil-based products / skin lotions / body lotions removed NA Wigs or hairpieces removed NA Smoking or tobacco materials removed NA Books / newspapers / magazines / loose paper removed NA Cologne, aftershave, perfume and deodorant removed NA Jewelry removed (may wrap wedding band) Make-up removed NA Hair care products removed NA Battery operated devices (external) removed NA Heating patches and chemical warmers removed NA Titanium eyewear removed Nail polish cured greater than 10 hours Casting material cured greater than 10 hours NA Hearing aids removed NA Loose dentures or partials removed NA Prosthetics have been removed NA Patient demonstrates correct use of air break device (if applicable) Patient concerns have been addressed Patient grounding bracelet on and cord attached to chamber Specifics for Inpatients (complete in addition to above) Medication sheet sent with patient Intravenous medications needed or due during  therapy sent with patient Drainage tubes (e.g. nasogastric tube or chest tube secured and vented) Endotracheal or Tracheotomy tube secured Cuff deflated of air and inflated with saline Airway suctioned Electronic Signature(s) Signed: 04/30/2020 6:08:37 PM By: Deon Pilling Entered  ByDeon Pilling on 04/30/2020 09:21:03

## 2020-05-14 NOTE — Progress Notes (Signed)
[name], [name] ([id number]) Visit Report for 04/25/2020 SuperBill Details Patient Name: Date of Service: [name] 04/25/2020 Medical Record Number: [id number] Patient Account Number: [id number] Date of Birth/Sex: Treating RN: [birth year] (50 y.o. 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

## 2020-05-14 NOTE — Progress Notes (Signed)
[name], [name] ([id number]) Visit Report for 05/03/2020 SuperBill Details Patient Name: Date of Service: [name]. 05/03/2020 Medical Record Number: [id number] Patient Account Number: [id number] Date of Birth/Sex: Treating RN: 09-08-1970 (50 y.o. 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

## 2020-05-14 NOTE — Progress Notes (Signed)
ROSAISELA, JAMROZ ([id number]) Visit Report for 05/03/2020 HBO Details Patient Name: Date of Service: [name] 05/03/2020 8:00 A M Medical Record Number: [id number] Patient Account Number: [id number] Date of Birth/Sex: Treating RN: 1970/04/13 (50 y.o. 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 HBO Treatment Details Treatment Number: 14 Patient Type: Outpatient Chamber Type: Monoplace Chamber Serial #: [id number] Treatment Protocol: 2.5 ATA with 90 minutes oxygen, with two 5 minute air breaks Treatment Details Compression Rate Down: 2.0 psi / minute De-Compression Rate Up: 2.0 psi / minute A breaks and breathing ir Compress Tx Pressure periods Decompress Decompress Begins Reached (leave unused spaces Begins Ends blank) Chamber Pressure (ATA 1 2.5 2.5 2.5 2.5 2.5 - - 2.5 1 ) Clock Time (24 hr) 08:10 08:22 08:52 08:57 09:27 09:32 - - 10:02 10:14 Treatment Length: 124 (minutes) Treatment Segments: 4 Vital Signs Capillary Blood Glucose Reference Range: 80 - 120 mg / dl HBO Diabetic Blood Glucose Intervention Range: <131 mg/dl or >249 mg/dl Time Vitals Blood Respiratory Capillary Blood Glucose Pulse Action Type: Pulse: Temperature: Taken: Pressure: Rate: Glucose (mg/dl): Meter #: Oximetry (%) Taken: Pre 08:00 132/80 89 18 98 Post 10:14 123/58 81 18 98.1 Treatment Response Treatment Completion Status: Treatment Completed without Adverse Event Electronic Signature(s) Signed: 05/06/2020 5:39:21 PM By: Levan Hurst RN, BSN Signed: 05/14/2020 3:23:56 PM By: Kalman Shan DO Entered By: Levan Hurst on 05/03/2020  11:50:51 -------------------------------------------------------------------------------- HBO Safety Checklist Details Patient Name: Date of Service: [name] 05/03/2020 8:00 A M Medical Record Number: [id number] Patient Account Number: [id number] Date of Birth/Sex: Treating RN: 1970-08-07 (50 y.o. [name] Primary Care Demmi Sindt: [name] Other Clinician: Referring Breanna Shorkey: Treating Amalee Olsen/Extender: [name] in Treatment: 3 HBO Safety Checklist Items Safety Checklist Consent Form Signed Patient voided / foley secured and emptied When did you last eato 0700 Last dose of injectable or oral agent na Ostomy pouch emptied and vented if applicable NA All implantable devices assessed, documented and approved Intravenous access site secured and place NA Valuables secured Linens and cotton and cotton/polyester blend (less than 51% polyester) Personal oil-based products / skin lotions / body lotions removed Wigs or hairpieces removed Smoking or tobacco materials removed Books / newspapers / magazines / loose paper removed Cologne, aftershave, perfume and deodorant removed Jewelry removed (may wrap wedding band) Make-up removed Hair care products removed Battery operated devices (external) removed Heating patches and chemical warmers removed Titanium eyewear removed Nail polish cured greater than 10 hours Casting material cured greater than 10 hours Hearing aids removed Loose dentures or partials removed Prosthetics have been removed NA Patient demonstrates correct use of air break device (if applicable) NA Patient concerns have been addressed NA Patient grounding bracelet on and cord attached to chamber NA Specifics for Inpatients (complete in addition to above) Medication sheet sent with patient NA Intravenous medications needed or due during therapy sent with patient NA Drainage tubes (e.g. nasogastric tube  or chest tube secured and vented) NA Endotracheal or Tracheotomy tube secured NA Cuff deflated of air and inflated with saline NA Airway suctioned NA Electronic Signature(s) Signed: 05/06/2020 5:39:21 PM By: Levan Hurst RN, BSN Entered By: Levan Hurst on  05/03/2020 11:49:49 

## 2020-05-14 NOTE — Progress Notes (Signed)
ARTEMISIA, AUVIL ([id number]) Visit Report for 05/14/2020 Arrival Information Details Patient Name: Date of Service: [name] [name] 05/14/2020 7:30 Sarah M Medical Record Number: [id number] Patient Account Number: [id number] Date of Birth/Sex: Treating RN: 02-06-1970 (50 y.o. [name], [name] Primary Care Tadeusz Stahl: [name] Other Clinician: Referring Ankita Newcomer: Treating Esias Mory/Extender: [name] in Treatment: 5 Visit Information History Since Last Visit Added or deleted any medications: No Patient Arrived: Ambulatory Any new allergies or adverse reactions: No Arrival Time: 07:48 Had Sarah fall or experienced change in No Accompanied By: self activities of daily living that may affect Transfer Assistance: None risk of falls: Patient Identification Verified: Yes Signs or symptoms of abuse/neglect since last visito No Secondary Verification Process Completed: Yes Hospitalized since last visit: No Patient Requires Transmission-Based Precautions: No Implantable device outside of the clinic excluding No Patient Has Alerts: No cellular tissue based products placed in the center since last visit: Has Dressing in Place as Prescribed: Yes Pain Present Now: No Electronic Signature(s) Signed: 05/14/2020 7:50:12 AM By: Sandre Kitty Entered By: Sandre Kitty on 05/14/2020 07:49:17 -------------------------------------------------------------------------------- Encounter Discharge Information Details Patient Name: Date of Service: [name] V [name] 05/14/2020 7:30 Sarah M Medical Record Number: [id number] Patient Account Number: [id number] Date of Birth/Sex: Treating RN: [birth year] (50 y.o. [name] Primary Care Lorenda Grecco: [name] Other Clinician: Referring Chany Woolworth: Treating Quamesha Mullet/Extender: [name] in Treatment: 5 Encounter Discharge Information Items Post Procedure Vitals Discharge  Condition: Stable Temperature (F): 97.9 Ambulatory Status: Ambulatory Pulse (bpm): 92 Discharge Destination: Home Respiratory Rate (breaths/min): 18 Transportation: Private Auto Blood Pressure (mmHg): 125/86 Schedule Follow-up Appointment: Yes Clinical Summary of Care: Provided on 05/14/2020 Form Type Recipient Paper Patient Patient Electronic Signature(s) Signed: 05/14/2020 8:39:13 AM By: Lorrin Jackson Entered By: Lorrin Jackson on 05/14/2020 08:39:12 -------------------------------------------------------------------------------- Lower Extremity Assessment Details Patient Name: Date of Service: [name] V [name] 05/14/2020 7:30 Sarah M Medical Record Number: [id number] Patient Account Number: [id number] Date of Birth/Sex: Treating RN: [birth year] (50 y.o. [name] Primary Care Jasmina Gendron: [name] Other Clinician: Referring Laneice Meneely: Treating Raahil Ong/Extender: [name] in Treatment: 5 Electronic Signature(s) Signed: 05/14/2020 5:40:35 PM By: Lorrin Jackson Entered By: Lorrin Jackson on 05/14/2020 08:08:14 -------------------------------------------------------------------------------- Multi Wound Chart Details Patient Name: Date of Service: [name] 05/14/2020 7:30 Sarah M Medical Record Number: [id number] Patient Account Number: [id number] Date of Birth/Sex: Treating RN: 07/04/1970 (50 y.o. 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)] [N/Sarah:N/Sarah] Secondary Etiology: [1:Hypertension, Osteoarthritis, ReceivedN/Sarah] Comorbid History: [1:Chemotherapy, Received Radiation 01/16/2020] [N/Sarah:N/Sarah] Date Acquired: [1:5] [N/Sarah:N/Sarah] Weeks of Treatment: [1:Open] [N/Sarah:N/Sarah] Wound Status: [1:5x4.5x2.3] [N/Sarah:N/Sarah] Measurements L x W x D (cm) [1:17.671] [N/Sarah:N/Sarah] Sarah (cm) : rea [1:40.644] [N/Sarah:N/Sarah] Volume (cm) : [1:-177.80%] [N/Sarah:N/Sarah] % Reduction in Sarah rea: [1:-480.80%] [N/Sarah:N/Sarah] % Reduction in Volume: [1:11] Position 1 (o'clock): [1:1.5] Maximum Distance 1 (cm): [1:9] Position 2 (o'clock): [1:3] Maximum Distance 2 (cm): [1:2] Position 3 (o'clock): [1:2.6] Maximum Distance 3 (cm): [1:Yes] [N/Sarah:N/Sarah] Tunneling: [1:Full Thickness Without Exposed] [N/Sarah:N/Sarah] Classification: [1:Support Structures Medium] [N/Sarah:N/Sarah] Exudate Amount: [1:Serosanguineous] [N/Sarah:N/Sarah]  Exudate Type: [1:red, brown] [N/Sarah:N/Sarah] Exudate Color: [1:Well defined, not attached] [N/Sarah:N/Sarah] Wound Margin: [1:Medium (34-66%)] [N/Sarah:N/Sarah] Granulation Amount: [1:Red, Pink] [N/Sarah:N/Sarah] Granulation Quality: [1:Medium (34-66%)] [N/Sarah:N/Sarah] Necrotic Amount: [1:Fat Layer (Subcutaneous Tissue): Yes N/Sarah] Exposed Structures: [1:Fascia: No Tendon: No Muscle: No Joint: No Bone: No None] [N/Sarah:N/Sarah] Epithelialization: [1:Debridement - Selective/Open Wound N/Sarah] Debridement: Pre-procedure Verification/Time Out 07:59 [N/Sarah:N/Sarah] Taken: [1:Other] [N/Sarah:N/Sarah] Pain Control: [1:Slough] [N/Sarah:N/Sarah] Tissue Debrided: [1:Non-Viable Tissue] [N/Sarah:N/Sarah] Level: [1:22.5] [N/Sarah:N/Sarah] Debridement Sarah (sq cm): [1:rea Curette] [N/Sarah:N/Sarah] Instrument: [1:Minimum] [N/Sarah:N/Sarah] Bleeding: [1:Pressure] [N/Sarah:N/Sarah] Hemostasis Sarah chieved: [1:Procedure was tolerated well] [N/Sarah:N/Sarah] Debridement Treatment Response: [1:5x4.5x2.3] [N/Sarah:N/Sarah] Post Debridement Measurements L x W x D (cm) [1:40.644] [N/Sarah:N/Sarah] Post Debridement Volume: (cm) [1:Debridement] [N/Sarah:N/Sarah] Treatment Notes Electronic Signature(s) Signed: 05/14/2020 4:50:17 PM  By: [name] MD Signed: 05/14/2020 6:10:24 PM By: Deon Pilling Entered By: [name] on 05/14/2020 08:20:14 -------------------------------------------------------------------------------- Multi-Disciplinary Care Plan Details Patient Name: Date of Service: [name] V [name] 05/14/2020 7:30 Sarah M Medical Record Number: [id number] Patient Account Number: [id number] Date of Birth/Sex: Treating RN: 1970/07/02 (50 y.o. [name], [name] Primary Care Makella Buckingham: [name] Other Clinician: Referring Suheyla Mortellaro: Treating Ishmel Acevedo/Extender: [name] in Treatment: 5 Active Inactive Wound/Skin Impairment Nursing Diagnoses: Impaired tissue integrity Knowledge deficit related to smoking impact on wound healing Knowledge deficit related to ulceration/compromised skin integrity Goals: Patient will demonstrate Sarah reduced rate of smoking or cessation of smoking Date Initiated: 04/09/2020 Target Resolution Date: 06/28/2020 Goal Status: Active Patient will have Sarah decrease in wound volume by X% from date: (specify in notes) Date Initiated: 04/09/2020 Target Resolution Date: 05/31/2020 Goal Status: Active Patient/caregiver will verbalize understanding of skin care regimen Date Initiated: 04/09/2020 Date Inactivated: 05/14/2020 Target Resolution Date: 05/14/2020 Goal Status: Met Ulcer/skin breakdown will have Sarah volume reduction of 30% by week 4 Date Initiated: 04/09/2020 Date Inactivated: 05/14/2020 Target Resolution Date: 05/11/2020 Unmet Reason: see wound Goal Status: Unmet meaurements. Interventions: Assess patient/caregiver ability to obtain necessary supplies Assess patient/caregiver ability to perform ulcer/skin care regimen upon admission and as needed Assess ulceration(s) every visit Provide education on smoking Notes: Electronic Signature(s) Signed: 05/14/2020 5:40:35 PM By: Lorrin Jackson Signed: 05/14/2020 6:10:24 PM By: Deon Pilling Entered By:  Lorrin Jackson on 05/14/2020 08:12:13 -------------------------------------------------------------------------------- Pain Assessment Details Patient Name: Date of Service: [name] V [name] 05/14/2020 7:30 Sarah M Medical Record Number: [id number] Patient Account Number: [id number] Date of Birth/Sex: Treating RN: [birth year] (50 y.o. [name] Primary Care Ibeth Fahmy: [name] Other Clinician: Referring Padraic Marinos: Treating Kalynn Declercq/Extender: [name] in Treatment: 5 Active Problems Location of Pain Severity and Description of Pain Patient Has Paino Yes Site Locations Rate the pain. Current Pain Level: 5 Character of Pain Describe the Pain: Burning, Tender Pain Management and Medication Current Pain Management: Medication: Yes Cold Application: No Rest: Yes Massage: No Activity: No T.E.N.S.: No Heat Application: No Leg drop or elevation: No Is the Current Pain Management Adequate: Inadequate How does your wound impact your activities of daily livingo Sleep: No Bathing: No Appetite: No Relationship With Others: No Bladder Continence: No Emotions: No Bowel Continence: No Work: No Toileting: No Drive: No Dressing: No Hobbies: No Electronic Signature(s) Signed: 05/14/2020 5:40:35 PM By: Lorrin Jackson Entered By: Lorrin Jackson on 05/14/2020 08:08:06 -------------------------------------------------------------------------------- Patient/Caregiver Education Details Patient Name: Date of Service: [name] 5/10/2022andnbsp7:30 Sarah M Medical Record Number: [id number] Patient Account Number: [id number] Date of Birth/Gender: Treating RN: [birth year] (50 y.o. F)  Deon Pilling Primary Care Physician: [name] Other Clinician: Referring Physician: Treating Physician/Extender: [name] in Treatment: 5 Education Assessment Education Provided To: Patient Education Topics  Provided Hyperbaric Oxygenation: Methods: Explain/Verbal Responses: State content correctly Pain: Handouts: Sarah Guide to Pain Control Methods: Explain/Verbal Responses: Reinforcements needed Wound Debridement: Methods: Explain/Verbal Responses: State content correctly Wound/Skin Impairment: Methods: Explain/Verbal, Printed Responses: State content correctly Electronic Signature(s) Signed: 05/14/2020 5:40:35 PM By: Lorrin Jackson Entered By: Lorrin Jackson on 05/14/2020 08:12:50 -------------------------------------------------------------------------------- Wound Assessment Details Patient Name: Date of Service: [name] V [name] 05/14/2020 7:30 Sarah M Medical Record Number: [id number] Patient Account Number: [id number] Date of Birth/Sex: Treating RN: 1970-06-14 (50 y.o. [name] Primary Care Tsuneo Faison: [name] Other Clinician: Referring Aidric Endicott: Treating Candita Borenstein/Extender: [name] in Treatment: 5 Wound Status Wound Number: 1 Primary Open Surgical Wound Etiology: Wound Location: Left Chest Secondary Necrosis (Radiation) Wounding Event: Surgical Injury Etiology: Date Acquired: 01/16/2020 Wound Status: Open Weeks Of Treatment: 5 Comorbid Hypertension, Osteoarthritis, Received Chemotherapy, Clustered Wound: No History: Received Radiation Wound Measurements Length: (cm) 5 Width: (cm) 4.5 Depth: (cm) 2.3 Area: (cm) 17.671 Volume: (cm) 40.644 % Reduction in Area: -177.8% % Reduction in Volume: -480.8% Epithelialization: None Tunneling: Yes Location 1 Position (o'clock): 11 Maximum Distance: (cm) 1.5 Location 2 Position (o'clock): 9 Maximum Distance: (cm) 3 Location 3 Position (o'clock): 2 Maximum Distance: (cm) 2.6 Undermining: No Wound Description Classification: Full Thickness Without Exposed Support Structures Wound Margin: Well defined, not attached Exudate Amount: Medium Exudate Type:  Serosanguineous Exudate Color: red, brown Foul Odor After Cleansing: No Slough/Fibrino Yes Wound Bed Granulation Amount: Medium (34-66%) Exposed Structure Granulation Quality: Red, Pink Fascia Exposed: No Necrotic Amount: Medium (34-66%) Fat Layer (Subcutaneous Tissue) Exposed: Yes Necrotic Quality: Adherent Slough Tendon Exposed: No Muscle Exposed: No Joint Exposed: No Bone Exposed: No Treatment Notes Wound #1 (Chest) Wound Laterality: Left Cleanser Normal Saline Discharge Instruction: Cleanse the wound with Normal Saline prior to applying Sarah clean dressing using gauze sponges, not tissue or cotton balls. Peri-Wound Care Topical Primary Dressing IODOFLEX 0.9% Cadexomer Iodine Pad 4x6 cm Discharge Instruction: Apply to wound bed as instructed Secondary Dressing Zetuvit Plus 4x4 in Discharge Instruction: Apply over primary dressing as directed. Secured With Compression Wrap Compression Stockings Add-Ons Education officer, community) Signed: 05/14/2020 5:40:35 PM By: Lorrin Jackson Previous Signature: 05/14/2020 7:50:12 AM Version By: Sandre Kitty Entered By: Lorrin Jackson on 05/14/2020 08:16:09 -------------------------------------------------------------------------------- Vitals Details Patient Name: Date of Service: [name] V [name] 05/14/2020 7:30 Sarah M Medical Record Number: [id number] Patient Account Number: [id number] Date of Birth/Sex: Treating RN: 10-10-1970 (50 y.o. [name], [name] Primary Care Chinaza Rooke: Other Clinician: [name] Referring Irl Bodie: Treating Jmichael Gille/Extender: [name] in Treatment: 5 Vital Signs Time Taken: 07:49 Temperature (F): 97.9 Height (in): 67 Pulse (bpm): 92 Weight (lbs): 257 Respiratory Rate (breaths/min): 18 Body Mass Index (BMI): 40.2 Blood Pressure (mmHg): 125/86 Reference Range: 80 - 120 mg / dl Electronic Signature(s) Signed: 05/14/2020 7:50:12 AM By: Sandre Kitty Entered By: Sandre Kitty on 05/14/2020 07:49:38

## 2020-05-14 NOTE — Progress Notes (Signed)
KENITRA, LEVENTHAL ([id number]) Visit Report for 04/26/2020 SuperBill Details Patient Name: Date of Service: [name] 04/26/2020 Medical Record Number: [id number] Patient Account Number: [id number] Date of Birth/Sex: Treating RN: 07/24/1970 (50 y.o. 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 Disruption of external operation (surgical) wound, not elsewhere classified, subsequent encounter 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 Facility Procedures CPT4 Code Description Modifier Quantity [id number] G0277-(Facility Use Only) HBOT full body chamber, 74min , 4 Physician Procedures Quantity CPT4 Code Description Modifier 6010932 35573 - WC PHYS HYPERBARIC OXYGEN THERAPY 1 ICD-10 Diagnosis Description 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

## 2020-05-14 NOTE — Progress Notes (Signed)
Sarah, Khan ([id number]) Visit Report for 04/26/2020 HBO Details Patient Name: Date of Service: [name] Sarah D. 04/26/2020 8:00 [name] Medical Record Number: [id number] Patient Account Number: [id number] Date of Birth/Sex: Treating RN: [birth year] (50 y.o. 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 HBO Treatment Details Treatment Number: 9 Patient Type: Outpatient Chamber Type: Monoplace Chamber Serial #: [id number] Treatment Protocol: 2.5 ATA with 90 minutes oxygen, with two 5 minute air breaks Treatment Details Compression Rate Down: 2.0 psi / minute De-Compression Rate Up: 2.0 psi / minute Sarah breaks and breathing ir Compress Tx Pressure periods Decompress Decompress Begins Reached (leave unused spaces Begins Ends blank) Chamber Pressure (ATA 1 2.5 2.5 2.5 2.5 2.5 - - 2.5 1 ) Clock Time (24 hr) 08:12 08:24 08:54 08:59 09:29 09:34 - - 10:04 10:15 Treatment Length: 123 (minutes) Treatment Segments: 4 Vital Signs Capillary Blood Glucose Reference Range: 80 - 120 mg / dl HBO Diabetic Blood Glucose Intervention Range: <131 mg/dl or >249 mg/dl Time Vitals Blood Respiratory Capillary Blood Glucose Pulse Action Type: Pulse: Temperature: Taken: Pressure: Rate: Glucose (mg/dl): Meter #: Oximetry (%) Taken: Pre 07:54 113/91 91 18 98.1 Post 10:16 118/74 77 18 98 Treatment Response Treatment Toleration: Well Treatment Completion Status: Treatment Completed without Adverse Event Electronic Signature(s) Signed: 04/26/2020 5:09:10 PM By: [name] Signed: 05/14/2020 3:23:56 PM By: Sarah Shan DO Entered By: [name]  on 04/26/2020 10:27:27 -------------------------------------------------------------------------------- HBO Safety Checklist Details Patient Name: Date of Service: [name] Sarah D. 04/26/2020 8:00 [name] Medical Record Number: [id number] Patient Account Number: [id number] Date of Birth/Sex: Treating RN: [birth year] (50 y.o. [name] Primary Care [name]: [name] Other Clinician: Referring Capone Schwinn: Treating Etienne Millward/Extender: [name] in Treatment: 2 HBO Safety Checklist Items Safety Checklist Consent Form Signed Patient voided / foley secured and emptied When did you last eato this morning Last dose of injectable or oral agent n/Sarah Ostomy pouch emptied and vented if applicable NA All implantable devices assessed, documented and approved NA Intravenous access site secured and place NA Valuables secured Linens and cotton and cotton/polyester blend (less than 51% polyester) Personal oil-based products / skin lotions / body lotions removed Wigs or hairpieces removed NA Smoking or tobacco materials removed NA Books / newspapers / magazines / loose paper removed NA Cologne, aftershave, perfume and deodorant removed NA Jewelry removed (may wrap wedding band) Make-up removed Hair care products removed NA Battery operated devices (external) removed NA Heating patches and chemical warmers removed NA Titanium eyewear removed Nail polish cured greater than 10 hours Casting material cured greater than 10 hours NA Hearing aids removed NA Loose dentures or partials removed NA Prosthetics have been removed NA Patient demonstrates correct use of air break device (if applicable) Patient concerns have been addressed Patient grounding bracelet on and cord attached to chamber Specifics for Inpatients (complete in addition to above) Medication sheet sent with patient Intravenous medications needed or due during therapy sent with  patient Drainage tubes (e.g. nasogastric tube or chest tube secured and vented) Endotracheal or Tracheotomy tube secured Cuff deflated of air and inflated with saline Airway suctioned Electronic Signature(s) Signed: 04/26/2020 5:09:10 PM By: [name] Entered By: Rolin Barry  Bobbi on 04/26/2020 09:39:45

## 2020-05-14 NOTE — Progress Notes (Signed)
BERNISE, SYLVAIN ([id number]) Visit Report for 04/30/2020 SuperBill Details Patient Name: Date of Service: [name] 04/30/2020 Medical Record Number: [id number] Patient Account Number: [id number] Date of Birth/Sex: Treating RN: Mar 18, 1970 (50 y.o. 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 Disruption of external operation (surgical) wound, not elsewhere classified, subsequent encounter 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 Facility Procedures CPT4 Code Description Modifier Quantity [id number] G0277-(Facility Use Only) HBOT full body chamber, 67min , 4 Physician Procedures Quantity CPT4 Code Description Modifier [id number] 71062 - WC PHYS HYPERBARIC OXYGEN THERAPY 1 ICD-10 Diagnosis Description 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 T81.31XD Disruption of external operation (surgical) wound, not elsewhere classified, subsequent encounter Electronic Signature(s) Signed: 04/30/2020 6:08:37 PM By: Deon Pilling Signed: 05/14/2020 3:23:56 PM By: Kalman Shan DO Entered By: Deon Pilling on 04/30/2020 10:42:09

## 2020-05-14 NOTE — Progress Notes (Signed)
[name], VIAR ([id number]) Visit Report for 04/29/2020 HBO Details Patient Name: Date of Service: [name] [name] 04/29/2020 8:00 A M Medical Record Number: [id number] Patient Account Number: [id number] Date of Birth/Sex: Treating RN: [birth year] (50 y.o. 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 HBO Treatment Details Treatment Number: 10 Patient Type: Outpatient Chamber Type: Monoplace Chamber Serial #: [id number] Treatment Protocol: 2.5 ATA with 90 minutes oxygen, with two 5 minute air breaks Treatment Details A breaks and breathing ir Compress Tx Pressure periods Decompress Decompress Begins Reached (leave unused spaces Begins Ends blank) Chamber Pressure (ATA 1 2.5 2.5 2.5 2.5 2.5 - - 2.5 1 ) Clock Time (24 hr) 08:10 08:21 08:51 08:56 09:26 09:31 - - 10:01 10:11 Treatment Length: 121 (minutes) Treatment Segments: 4 Vital Signs Capillary Blood Glucose Reference Range: 80 - 120 mg / dl HBO Diabetic Blood Glucose Intervention Range: <131 mg/dl or >249 mg/dl Time Vitals Blood Respiratory Capillary Blood Glucose Pulse Action Type: Pulse: Temperature: Taken: Pressure: Rate: Glucose (mg/dl): Meter #: Oximetry (%) Taken: Pre 07:57 131/80 90 18 97.9 Post 10:16 126/57 65 20 97.9 Treatment Response Treatment Toleration: Well Treatment Completion Status: Treatment Completed without Adverse Event Electronic Signature(s) Signed: 04/29/2020 4:05:30 PM By: Deon Pilling Signed: 05/14/2020 3:23:56 PM By: Kalman Shan DO Entered By: Deon Pilling on 04/29/2020  10:26:56 -------------------------------------------------------------------------------- HBO Safety Checklist Details Patient Name: Date of Service: [name] V [name] 04/29/2020 8:00 A M Medical Record Number: [id number] Patient Account Number: [id number] Date of Birth/Sex: Treating RN: 1970-10-02 (50 y.o. [name], [name] Primary Care Jamilette Suchocki: [name] Other Clinician: Referring Demorio Seeley: Treating Shterna Laramee/Extender: [name] in Treatment: 2 HBO Safety Checklist Items Safety Checklist Consent Form Signed Patient voided / foley secured and emptied When did you last eato this morning Last dose of injectable or oral agent n/a Ostomy pouch emptied and vented if applicable NA All implantable devices assessed, documented and approved NA Intravenous access site secured and place NA Valuables secured Linens and cotton and cotton/polyester blend (less than 51% polyester) Personal oil-based products / skin lotions / body lotions removed NA Wigs or hairpieces removed NA Smoking or tobacco materials removed NA Books / newspapers / magazines / loose paper removed NA Cologne, aftershave, perfume and deodorant removed NA Jewelry removed (may wrap wedding band) Make-up removed NA Hair care products removed NA Battery operated devices (external) removed NA Heating patches and chemical warmers removed NA Titanium eyewear removed Nail polish cured greater than 10 hours Casting material cured greater than 10 hours NA Hearing aids removed NA Loose dentures or partials removed NA Prosthetics have been removed NA Patient demonstrates correct use of air break device (if applicable) Patient concerns have been addressed Patient grounding bracelet on and cord attached to chamber Specifics for Inpatients (complete in addition to above) Medication sheet sent with patient Intravenous medications needed or due during therapy sent with  patient Drainage tubes (e.g. nasogastric tube or chest tube secured and vented) Endotracheal or Tracheotomy tube secured Cuff deflated of air and inflated with saline Airway suctioned Electronic Signature(s) Signed: 04/29/2020 4:05:30 PM By: Deon Pilling Entered By: Deon Pilling on 04/29/2020 10:09:42

## 2020-05-14 NOTE — Progress Notes (Signed)
[name], [name] ([id number]) Visit Report for 05/02/2020 HBO Details Patient Name: Date of Service: [name] [name] 05/02/2020 8:00 A M Medical Record Number: [id number] Patient Account Number: [id number] Date of Birth/Sex: Treating RN: [birth year] (50 y.o. 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 HBO Treatment Details Treatment Number: 13 Patient Type: Outpatient Chamber Type: Monoplace Chamber Serial #: [id number] Treatment Protocol: 2.5 ATA with 90 minutes oxygen, with two 5 minute air breaks Treatment Details Compression Rate Down: 2.0 psi / minute De-Compression Rate Up: 2.0 psi / minute A breaks and breathing ir Compress Tx Pressure periods Decompress Decompress Begins Reached (leave unused spaces Begins Ends blank) Chamber Pressure (ATA 1 2.5 2.5 2.5 2.5 2.5 - - 2.5 1 ) Clock Time (24 hr) 08:15 08:27 08:57 09:02 09:32 09:37 - - 10:07 10:19 Treatment Length: 124 (minutes) Treatment Segments: 4 Vital Signs Capillary Blood Glucose Reference Range: 80 - 120 mg / dl HBO Diabetic Blood Glucose Intervention Range: <131 mg/dl or >249 mg/dl Time Vitals Blood Respiratory Capillary Blood Glucose Pulse Action Type: Pulse: Temperature: Taken: Pressure: Rate: Glucose (mg/dl): Meter #: Oximetry (%) Taken: Pre 08:07 120/82 89 18 98.1 Post 10:19 134/77 76 16 97.9 Treatment Response Treatment Completion Status: Treatment Completed without Adverse Event Electronic Signature(s) Signed: 05/06/2020 5:39:21 PM By: Levan Hurst RN, BSN Signed: 05/14/2020 3:23:56 PM By: Kalman Shan DO Entered By: Levan Hurst on 05/02/2020  10:50:34 -------------------------------------------------------------------------------- HBO Safety Checklist Details Patient Name: Date of Service: [name] V [name] 05/02/2020 8:00 A M Medical Record Number: [id number] Patient Account Number: [id number] Date of Birth/Sex: Treating RN: January 31, 1970 (50 y.o. [name] Primary Care Sada Mazzoni: [name] Other Clinician: Referring Lazaria Schaben: Treating Valaree Fresquez/Extender: [name] in Treatment: 3 HBO Safety Checklist Items Safety Checklist Consent Form Signed Patient voided / foley secured and emptied When did you last eato 0700 Last dose of injectable or oral agent na Ostomy pouch emptied and vented if applicable NA All implantable devices assessed, documented and approved Intravenous access site secured and place NA Valuables secured Linens and cotton and cotton/polyester blend (less than 51% polyester) Personal oil-based products / skin lotions / body lotions removed Wigs or hairpieces removed Smoking or tobacco materials removed Books / newspapers / magazines / loose paper removed Cologne, aftershave, perfume and deodorant removed Jewelry removed (may wrap wedding band) Make-up removed Hair care products removed Battery operated devices (external) removed Heating patches and chemical warmers removed Titanium eyewear removed Nail polish cured greater than 10 hours Casting material cured greater than 10 hours NA Hearing aids removed NA Loose dentures or partials removed Prosthetics have been removed NA Patient demonstrates correct use of air break device (if applicable) Patient concerns have been addressed Patient grounding bracelet on and cord attached to chamber Specifics for Inpatients (complete in addition to above) Medication sheet sent with patient NA Intravenous medications needed or due during therapy sent with patient NA Drainage tubes (e.g. nasogastric tube or  chest tube secured and vented) NA Endotracheal or Tracheotomy tube secured NA Cuff deflated of air and inflated with saline NA Airway suctioned NA Electronic Signature(s) Signed: 05/06/2020 5:39:21 PM By: Levan Hurst RN, BSN Entered By: Levan Hurst on 05/02/2020  10:49:24 

## 2020-05-14 NOTE — Progress Notes (Signed)
CONCEPTION, DOEBLER ([id number]) Visit Report for 05/01/2020 HBO Details Patient Name: Date of Service: [name] 05/01/2020 8:00 A M Medical Record Number: [id number] Patient Account Number: [id number] Date of Birth/Sex: Treating RN: 03-09-1970 (50 y.o. 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 HBO Treatment Details Treatment Number: 12 Patient Type: Outpatient Chamber Type: Monoplace Chamber Serial #: [id number] Treatment Protocol: 2.5 ATA with 90 minutes oxygen, with two 5 minute air breaks Treatment Details Compression Rate Down: 2.0 psi / minute De-Compression Rate Up: 2.0 psi / minute A breaks and breathing ir Compress Tx Pressure periods Decompress Decompress Begins Reached (leave unused spaces Begins Ends blank) Chamber Pressure (ATA 1 2.5 2.5 2.5 2.5 2.5 - - 2.5 1 ) Clock Time (24 hr) 08:50 09:01 09:31 09:36 10:06 10:11 - - 10:41 10:51 Treatment Length: 121 (minutes) Treatment Segments: 4 Vital Signs Capillary Blood Glucose Reference Range: 80 - 120 mg / dl HBO Diabetic Blood Glucose Intervention Range: <131 mg/dl or >249 mg/dl Time Vitals Blood Respiratory Capillary Blood Glucose Pulse Action Type: Pulse: Temperature: Taken: Pressure: Rate: Glucose (mg/dl): Meter #: Oximetry (%) Taken: Pre 08:45 146/80 105 20 97.7 Post 10:52 19/66 75 18 97.6 Treatment Response Treatment Toleration: Well Treatment Completion Status: Treatment Completed without Adverse Event Electronic Signature(s) Signed: 05/01/2020 5:19:23 PM By: Deon Pilling Signed: 05/14/2020 3:23:56 PM By: Kalman Shan DO Entered By: Deon Pilling on 05/01/2020 11:00:39 -------------------------------------------------------------------------------- HBO Safety Checklist Details Patient Name: Date of Service: [name] 05/01/2020 8:00 A M Medical Record Number: [id number] Patient Account Number: [id number] Date of Birth/Sex: Treating RN: [birth year] (50 y.o. [name], [name] Primary Care Celesta Funderburk: [name] Other Clinician: Referring Giacomo Valone: Treating Jesicca Dipierro/Extender: [name] in Treatment: 3 HBO Safety Checklist Items Safety Checklist Consent Form Signed Patient voided / foley secured and emptied When did you last eato this morning Last dose of injectable or oral agent n/a Ostomy pouch emptied and vented if applicable NA All implantable devices assessed, documented and approved NA Intravenous access site secured and place NA Valuables secured Linens and cotton and cotton/polyester blend (less than 51% polyester) Personal oil-based products / skin lotions / body lotions removed NA Wigs or hairpieces removed NA Smoking or tobacco materials removed NA Books / newspapers / magazines / loose paper removed NA Cologne, aftershave, perfume and deodorant removed NA Jewelry removed (may wrap wedding band) Make-up removed NA Hair care products removed NA Battery operated devices (external) removed NA Heating patches and chemical warmers removed NA Titanium eyewear removed Nail polish cured greater than 10 hours Casting material cured greater than 10 hours NA Hearing aids removed NA Loose dentures or partials removed NA Prosthetics have been removed NA Patient demonstrates correct use of air break device (if applicable) Patient concerns have been addressed Patient grounding bracelet on and cord attached to chamber Specifics for Inpatients (complete in addition to above) Medication sheet sent with patient Intravenous medications needed or due during  therapy sent with patient Drainage tubes (e.g. nasogastric tube or chest tube secured and vented) Endotracheal or Tracheotomy tube secured Cuff deflated of air and inflated with saline Airway suctioned Electronic Signature(s) Signed: 05/01/2020 5:19:23 PM By: Deon Pilling Entered  By: Deon Pilling on 05/01/2020 08:53:22

## 2020-05-14 NOTE — Progress Notes (Signed)
WINTA, BARCELO ([id number]) Visit Report for 04/25/2020 HBO Details Patient Name: Date of Service: A Sarah [name] 04/25/2020 8:00 A M Medical Record Number: [id number] Patient Account Number: [id number] Date of Birth/Sex: Treating RN: Nov 11, 1970 (50 y.o. 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 HBO Treatment Details Treatment Number: 8 Patient Type: Outpatient Chamber Type: Monoplace Chamber Serial #: [id number] Treatment Protocol: 2.5 ATA with 90 minutes oxygen, with two 5 minute air breaks Treatment Details Compression Rate Down: 2.0 psi / minute De-Compression Rate Up: 2.0 psi / minute A breaks and breathing ir Compress Tx Pressure periods Decompress Decompress Begins Reached (leave unused spaces Begins Ends blank) Chamber Pressure (ATA 1 2.5 2.5 2.5 2.5 2.5 - - 2.5 1 ) Clock Time (24 hr) 08:21 08:33 09:03 09:08 09:38 09:43 - - 10:10 10:22 Treatment Length: 121 (minutes) Treatment Segments: 4 Vital Signs Capillary Blood Glucose Reference Range: 80 - 120 mg / dl HBO Diabetic Blood Glucose Intervention Range: <131 mg/dl or >249 mg/dl Time Vitals Blood Respiratory Capillary Blood Glucose Pulse Action Type: Pulse: Temperature: Taken: Pressure: Rate: Glucose (mg/dl): Meter #: Oximetry (%) Taken: Pre 08:15 122/67 98 18 98.2 Post 10:22 132/78 81 18 98.1 Treatment Response Treatment Completion Status: Treatment Completed without Adverse Event Treatment Notes 3 minutes before decompression was to begin, pt stated she was getting too hot and asked to finish treatment early. Per request, pt decompressed, no  other complaints or concerns noted or verbalized by pt, vitals stable after treatment. Electronic Signature(s) Signed: 04/25/2020 5:27:18 PM By: Levan Hurst RN, BSN Signed: 05/14/2020 3:23:56 PM By: Kalman Shan DO Entered By: Levan Hurst on 04/25/2020 11:51:37 -------------------------------------------------------------------------------- HBO Safety Checklist Details Patient Name: Date of Service: Sarah Crochet V [name] 04/25/2020 8:00 A M Medical Record Number: [id number] Patient Account Number: [id number] Date of Birth/Sex: Treating RN: Mar 30, 1970 (50 y.o. [name] Primary Care Fredderick Swanger: Other Clinician: [name] Referring Caterra Ostroff: Treating Joquan Lotz/Extender: [name] in Treatment: 2 HBO Safety Checklist Items Safety Checklist Consent Form Signed Patient voided / foley secured and emptied When did you last eato 0700 Last dose of injectable or oral agent na Ostomy pouch emptied and vented if applicable NA All implantable devices assessed, documented and approved Intravenous access site secured and place NA Valuables secured Linens and cotton and cotton/polyester blend (less than 51% polyester) Personal oil-based products / skin lotions / body lotions removed Wigs or hairpieces removed Smoking or tobacco materials removed Books / newspapers / magazines / loose paper removed Cologne, aftershave, perfume and deodorant removed Jewelry removed (may wrap wedding band) Make-up removed Hair care products removed Battery operated devices (external) removed Heating patches and chemical warmers removed Titanium eyewear removed Nail polish cured greater than 10 hours Casting material cured greater than 10 hours NA Hearing aids removed NA Loose dentures or partials removed Prosthetics have been removed NA Patient demonstrates correct use of air break device (if applicable) NA Patient concerns have been  addressed NA Patient grounding bracelet on and cord attached to chamber NA Specifics for Inpatients (complete in addition to above) Medication sheet sent with patient NA Intravenous medications needed or due during therapy sent with patient NA Drainage tubes (e.g. nasogastric  tube or chest tube secured and vented) NA Endotracheal or Tracheotomy tube secured NA Cuff deflated of air and inflated with saline NA Airway suctioned NA Electronic Signature(s) Signed: 04/25/2020 5:27:18 PM By: Levan Hurst RN, BSN Entered By: Levan Hurst on 04/25/2020 [place]

## 2020-05-14 NOTE — Progress Notes (Signed)
CORINN, STOLTZFUS ([id number]) Visit Report for 04/30/2020 SuperBill Details Patient Name: Date of Service: [name] 04/30/2020 Medical Record Number: [id number] Patient Account Number: [id number] Date of Birth/Sex: Treating RN: 1970/01/19 (50 y.o. 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

## 2020-05-15 ENCOUNTER — Encounter (HOSPITAL_BASED_OUTPATIENT_CLINIC_OR_DEPARTMENT_OTHER): Payer: Medicaid Other | Admitting: Physician Assistant

## 2020-05-15 ENCOUNTER — Other Ambulatory Visit: Payer: Self-pay

## 2020-05-15 DIAGNOSIS — C50412 Malignant neoplasm of upper-outer quadrant of left female breast: Secondary | ICD-10-CM | POA: Diagnosis not present

## 2020-05-15 NOTE — Progress Notes (Signed)
[name], [name] ([id number]) Visit Report for 05/15/2020 Arrival Information Details Patient Name: Date of Service: [name] 05/15/2020 8:00 A M Medical Record Number: [id number] Patient Account Number: [id number] Date of Birth/Sex: Treating RN: 1970-04-30 (50 y.o. [name], [name] Primary Care Exa Bomba: [name] Other Clinician: Referring Shakaya Bhullar: Treating Shepherd Finnan/Extender: Randon Goldsmith in Treatment: 5 Visit Information History Since Last Visit Added or deleted any medications: No Patient Arrived: Ambulatory Any new allergies or adverse reactions: No Arrival Time: 07:45 Had a fall or experienced change in No Accompanied By: self activities of daily living that may affect Transfer Assistance: None risk of falls: Patient Identification Verified: Yes Signs or symptoms of abuse/neglect since last visito No Secondary Verification Process Completed: Yes Hospitalized since last visit: No Patient Requires Transmission-Based Precautions: No Implantable device outside of the clinic excluding No Patient Has Alerts: No cellular tissue based products placed in the center since last visit: Pain Present Now: No Electronic Signature(s) Signed: 05/15/2020 5:30:57 PM By: Deon Pilling Entered By: Deon Pilling on 05/15/2020 09:47:14 -------------------------------------------------------------------------------- Encounter Discharge Information Details Patient Name: Date of Service: [name] V [name] 05/15/2020 8:00 A M Medical Record Number: [id number] Patient Account Number: [id number] Date of Birth/Sex: Treating RN: 1970-02-20 (50 y.o. [name] Primary Care Zanyiah Posten: [name] Other Clinician: Referring Cordarious Zeek: Treating Almira Phetteplace/Extender: Randon Goldsmith in Treatment: 5 Encounter Discharge Information Items Discharge Condition: Stable Ambulatory Status: Ambulatory Discharge Destination:  Home Transportation: Private Auto Accompanied By: self Schedule Follow-up Appointment: Yes Clinical Summary of Care: Electronic Signature(s) Signed: 05/15/2020 5:30:57 PM By: Deon Pilling Entered By: Deon Pilling on 05/15/2020 10:37:37 -------------------------------------------------------------------------------- Patient/Caregiver Education Details Patient Name: Date of Service: [name] [name] 5/11/2022andnbsp8:00 A M Medical Record Number: [id number] Patient Account Number: [id number] Date of Birth/Gender: Treating RN: Dec 10, 1970 (50 y.o. [name] Primary Care Physician: [name] Other Clinician: Referring Physician: Treating Physician/Extender: Randon Goldsmith in Treatment: 5 Education Assessment Education Provided To: Patient Education Topics Provided Wound/Skin Impairment: Handouts: Skin Care Do's and Dont's Methods: Explain/Verbal Responses: Reinforcements needed Electronic Signature(s) Signed: 05/15/2020 5:30:57 PM By: Deon Pilling Entered By: Deon Pilling on 05/15/2020 10:37:27 -------------------------------------------------------------------------------- Vitals Details Patient Name: Date of Service: [name], [name] 05/15/2020 8:00 A M Medical Record Number: [id number] Patient Account Number: [id number] Date of Birth/Sex: Treating RN: [birth year] (50 y.o. [name], [name] Primary Care Fancy Dunkley: [name] Other Clinician: Referring Thalya Fouche: Treating Nea Gittens/Extender: Randon Goldsmith in Treatment: 5 Vital Signs Time Taken: 07:47 Temperature (F): 98.2 Height (in): 67 Pulse (bpm): 109 Weight (lbs): 257 Respiratory Rate (breaths/min): 18 Body Mass Index (BMI): 40.2 Blood Pressure (mmHg): 141/96 Reference Range: 80 - 120 mg / dl Electronic Signature(s) Signed: 05/15/2020 5:30:57 PM By: Deon Pilling Entered By: Deon Pilling on 05/15/2020 09:47:30

## 2020-05-15 NOTE — Progress Notes (Signed)
EREN, PUEBLA ([id number]) Visit Report for 05/15/2020 SuperBill Details Patient Name: Date of Service: [name] 05/15/2020 Medical Record Number: [id number] Patient Account Number: [id number] Date of Birth/Sex: Treating RN: 1970-03-11 (50 y.o. 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 Disruption of external operation (surgical) wound, not elsewhere classified, subsequent encounter 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 Facility Procedures CPT4 Code Description Modifier Quantity [id number]-(Facility Use Only) HBOT full body chamber, 39min , 4 Physician Procedures Quantity CPT4 Code Description Modifier [id number] - WC PHYS HYPERBARIC OXYGEN THERAPY 1 ICD-10 Diagnosis Description 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

## 2020-05-15 NOTE — Progress Notes (Signed)
GWENN, TEODORO ([id number]) Visit Report for 05/14/2020 Arrival Information Details Patient Name: Date of Service: [name] 05/14/2020 8:00 A M Medical Record Number: [id number] Patient Account Number: [id number] Date of Birth/Sex: Treating RN: 10-May-1970 (50 y.o. [name] Primary Care Hamda Klutts: [name] Other Clinician: Referring Eleny Cortez: Treating Tayonna Bacha/Extender: [name] in Treatment: 5 Visit Information History Since Last Visit Added or deleted any medications: No Patient Arrived: Ambulatory Any new allergies or adverse reactions: No Arrival Time: 08:30 Had a fall or experienced change in No Accompanied By: alone activities of daily living that may affect Transfer Assistance: None risk of falls: Patient Identification Verified: Yes Signs or symptoms of abuse/neglect since last visito No Secondary Verification Process Completed: Yes Hospitalized since last visit: No Patient Requires Transmission-Based Precautions: No Implantable device outside of the clinic excluding No Patient Has Alerts: No cellular tissue based products placed in the center since last visit: Has Dressing in Place as Prescribed: Yes Pain Present Now: No Electronic Signature(s) Signed: 05/15/2020 5:58:33 PM By: Levan Hurst RN, BSN Entered By: Levan Hurst on 05/14/2020 09:05:09 -------------------------------------------------------------------------------- Encounter Discharge Information Details Patient Name: Date of Service: [name] 05/14/2020 8:00 A M Medical Record Number: [id number] Patient Account Number: [id number] Date of Birth/Sex: Treating RN: 10/18/1970 (50 y.o. [name] Primary Care [name]: [name] Other Clinician: Referring Kimberlynn Lumbra: Treating Micael Barb/Extender: [name] in Treatment: 5 Encounter Discharge Information Items Discharge Condition:  Stable Ambulatory Status: Ambulatory Discharge Destination: Home Transportation: Private Auto Accompanied By: alone Schedule Follow-up Appointment: Yes Clinical Summary of Care: Patient Declined Electronic Signature(s) Signed: 05/15/2020 5:58:33 PM By: Levan Hurst RN, BSN Entered By: Levan Hurst on 05/14/2020 11:20:19 -------------------------------------------------------------------------------- Vitals Details Patient Name: Date of Service: [name] 05/14/2020 8:00 A M Medical Record Number: [id number] Patient Account Number: [id number] Date of Birth/Sex: Treating RN: 06/05/1970 (50 y.o. [name] Primary Care Ronnald Shedden: [name] Other Clinician: Referring Karam Dunson: Treating Kynsleigh Westendorf/Extender: [name] in Treatment: 5 Vital Signs Time Taken: 08:30 Temperature (F): 97.9 Height (in): 67 Pulse (bpm): 92 Weight (lbs): 257 Respiratory Rate (breaths/min): 18 Body Mass Index (BMI): 40.2 Blood Pressure (mmHg): 125/86 Reference Range: 80 - 120 mg / dl Electronic Signature(s) Signed: 05/15/2020 5:58:33 PM By: Levan Hurst RN, BSN Entered By: Levan Hurst on 05/14/2020 09:05:35

## 2020-05-15 NOTE — Progress Notes (Signed)
[name], [name] ([id number]) Visit Report for 05/14/2020 HBO Details Patient Name: Date of Service: Sarah BDELA [name] 05/14/2020 8:00 Sarah M Medical Record Number: [id number] Patient Account Number: [id number] Date of Birth/Sex: Treating RN: 05-Mar-1970 (50 y.o. 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 HBO Treatment Details Treatment Number: 20 Patient Type: Outpatient Chamber Type: Monoplace Chamber Serial #: [id number] Treatment Protocol: 2.5 ATA with 90 minutes oxygen, with two 5 minute air breaks Treatment Details Compression Rate Down: 2.0 psi / minute De-Compression Rate Up: 2.0 psi / minute Sarah breaks and breathing ir Compress Tx Pressure periods Decompress Decompress Begins Reached (leave unused spaces Begins Ends blank) Chamber Pressure (ATA 1 2.5 2.5 2.5 2.5 2.5 - - 2.5 1 ) Clock Time (24 hr) 08:38 08:50 09:20 09:25 09:55 10:00 - - 10:25 10:33 Treatment Length: 115 (minutes) Treatment Segments: 4 Vital Signs Capillary Blood Glucose Reference Range: 80 - 120 mg / dl HBO Diabetic Blood Glucose Intervention Range: <131 mg/dl or >249 mg/dl Time Vitals Blood Respiratory Capillary Blood Glucose Pulse Action Type: Pulse: Temperature: Taken: Pressure: Rate: Glucose (mg/dl): Meter #: Oximetry (%) Taken: Pre 08:30 125/86 92 18 97.9 Post 10:33 122/70 81 18 98 Treatment Response Treatment Completion Status: Treatment Completed without Adverse Event Treatment Notes Approx. 5 minutes before decompression was to begin, pt stated that she was feeling hot, asked to be decompressed sooner. Pt stable after decompression, she states  that she feels ok, just had Sarah hot flash due to pain from debridement this morning. Dr. Dellia Khan notified. [name] Notes Patient initially tolerated the vast majority of her treatment without incident however shortly before she was going to ascend she reported some nausea we therefore brought her up early. Did not see her post dive. The patient was also seen for wound care evaluation earlier in the day Physician HBO Attestation: I certify that I supervised this HBO treatment in accordance with Medicare guidelines. Sarah trained emergency response team is readily available per Yes hospital policies and procedures. Continue HBOT as ordered. Yes Electronic Signature(s) Signed: 05/14/2020 4:50:17 PM By: Sarah Ham MD Entered By: [name] on 05/14/2020 15:15:56 -------------------------------------------------------------------------------- HBO Safety Checklist Details Patient Name: Date of Service: Sarah Crochet V [name] 05/14/2020 8:00 Sarah M Medical Record Number: [id number] Patient Account Number: [id number] Date of Birth/Sex: Treating RN: February 26, 1970 (50 y.o.o. [name] Primary Care [name]: [name] Other Clinician: Referring [name]: Treating [name]/Extender: [name] in Treatment: 5 HBO Safety Checklist Items Safety Checklist Consent Form Signed Patient voided / foley secured and emptied When did you last eato 0645 Last dose of injectable or oral agent na Ostomy pouch emptied and vented if applicable NA All implantable devices assessed, documented and approved Intravenous access site secured and place NA Valuables secured Linens and cotton and cotton/polyester blend (less than 51% polyester) Personal oil-based products / skin lotions / body lotions removed Wigs or hairpieces removed Smoking or tobacco materials removed Books / newspapers / magazines / loose paper removed Cologne, aftershave, perfume and deodorant  removed Jewelry removed (may wrap wedding band) Make-up removed Hair care products removed Battery operated devices (external) removed Heating patches and chemical warmers removed Titanium eyewear removed Nail polish  cured greater than 10 hours Casting material cured greater than 10 hours NA Hearing aids removed NA Loose dentures or partials removed Prosthetics have been removed NA Patient demonstrates correct use of air break device (if applicable) Patient concerns have been addressed Patient grounding bracelet on and cord attached to chamber Specifics for Inpatients (complete in addition to above) Medication sheet sent with patient NA Intravenous medications needed or due during therapy sent with patient NA Drainage tubes (e.g. nasogastric tube or chest tube secured and vented) NA Endotracheal or Tracheotomy tube secured NA Cuff deflated of air and inflated with saline NA Airway suctioned NA Electronic Signature(s) Signed: 05/15/2020 5:58:33 PM By: Sarah Hurst RN, BSN Entered By: [name] on 05/14/2020 09:06:25

## 2020-05-15 NOTE — Progress Notes (Signed)
[name], [name] ([id number]) Visit Report for 05/15/2020 HBO Details Patient Name: Date of Service: Sarah BDELA Sarah [name] 05/15/2020 8:00 Sarah M Medical Record Number: [id number] Patient Account Number: [id number] Date of Birth/Sex: Treating RN: 03-17-1970 (50 y.o. 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 HBO Treatment Details Treatment Number: 21 Patient Type: Outpatient Chamber Type: Monoplace Chamber Serial #: [id number] Treatment Protocol: 2.5 ATA with 90 minutes oxygen, with two 5 minute air breaks Treatment Details Compression Rate Down: 2.0 psi / minute De-Compression Rate Up: 2.0 psi / minute Sarah breaks and breathing ir Compress Tx Pressure periods Decompress Decompress Begins Reached (leave unused spaces Begins Ends blank) Chamber Pressure (ATA 1 2.5 2.5 2.5 2.5 2.5 - - 2.5 1 ) Clock Time (24 hr) 08:06 08:18 08:48 08:53 09:23 09:28 - - 09:58 10:08 Treatment Length: 122 (minutes) Treatment Segments: 4 Vital Signs Capillary Blood Glucose Reference Range: 80 - 120 mg / dl HBO Diabetic Blood Glucose Intervention Range: <131 mg/dl or >249 mg/dl Time Vitals Blood Respiratory Capillary Blood Glucose Pulse Action Type: Pulse: Temperature: Taken: Pressure: Rate: Glucose (mg/dl): Meter #: Oximetry (%) Taken: Pre 07:47 141/96 109 18 98.2 Post 10:15 116/85 89 18 98.1 Treatment Response Treatment Toleration: Well Treatment Completion Status: Treatment Completed without Adverse Event Electronic Signature(s) Signed: 05/15/2020 5:30:57 PM By: Deon Pilling Signed: 05/15/2020 5:37:47 PM By: Worthy Keeler PA-C Entered By: Deon Pilling on 05/15/2020 10:37:00 -------------------------------------------------------------------------------- HBO Safety Checklist Details Patient Name: Date of Service: Sarah Crochet V [name] 05/15/2020 8:00 Sarah M Medical Record Number: [id number] Patient Account Number: [id number] Date of Birth/Sex: Treating RN: 1970/04/16 (50 y.o. [name], [name] Primary Care Javious Hallisey: [name] Other Clinician: Referring Berlinda Farve: Treating Jeania Nater/Extender: Randon Goldsmith in Treatment: 5 HBO Safety Checklist Items Safety Checklist Consent Form Signed Patient voided / foley secured and emptied When did you last eato last night Last dose of injectable or oral agent n/Sarah Ostomy pouch emptied and vented if applicable NA All implantable devices assessed, documented and approved NA Intravenous access site secured and place NA Valuables secured Linens and cotton and cotton/polyester blend (less than 51% polyester) Personal oil-based products / skin lotions / body lotions removed NA Wigs or hairpieces removed NA Smoking or tobacco materials removed NA Books / newspapers / magazines / loose paper removed NA Cologne, aftershave, perfume and deodorant removed NA Jewelry removed (may wrap wedding band) Make-up removed NA Hair care products removed NA Battery operated devices (external) removed NA Heating patches and chemical warmers removed NA Titanium eyewear removed Nail polish cured greater than 10 hours Casting material cured greater than 10 hours NA Hearing aids removed NA Loose dentures or partials removed Prosthetics have been removed NA Patient demonstrates correct use of air break device (if applicable) Patient concerns have been addressed Patient grounding bracelet on and cord attached to chamber Specifics for Inpatients (complete in addition to above) Medication sheet sent with patient Intravenous medications needed or due during therapy sent  with patient Drainage tubes (e.g. nasogastric tube or chest tube secured and vented) Endotracheal or Tracheotomy tube secured Cuff deflated of air and inflated with saline Airway suctioned Electronic Signature(s) Signed: 05/15/2020 5:30:57 PM By: Rolin Barry,  Bobbi Entered By: Deon Pilling on 05/15/2020 09:48:04

## 2020-05-15 NOTE — Progress Notes (Signed)
ROCHANDA, HARPHAM ([id number]) Visit Report for 05/14/2020 SuperBill Details Patient Name: Date of Service: [name] 05/14/2020 Medical Record Number: [id number] Patient Account Number: [id number] Date of Birth/Sex: Treating RN: 1970-08-31 (50 y.o. 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 Disruption of external operation (surgical) wound, not elsewhere classified, subsequent encounter 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 Facility Procedures CPT4 Code Description Modifier Quantity [id number] G0277-(Facility Use Only) HBOT full body chamber, 93min , 4 Physician Procedures Quantity CPT4 Code Description Modifier [id number] - WC PHYS HYPERBARIC OXYGEN THERAPY 1 ICD-10 Diagnosis Description 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

## 2020-05-16 ENCOUNTER — Other Ambulatory Visit: Payer: Self-pay

## 2020-05-16 ENCOUNTER — Encounter (HOSPITAL_BASED_OUTPATIENT_CLINIC_OR_DEPARTMENT_OTHER): Payer: Medicaid Other | Admitting: Internal Medicine

## 2020-05-16 DIAGNOSIS — C50412 Malignant neoplasm of upper-outer quadrant of left female breast: Secondary | ICD-10-CM | POA: Diagnosis not present

## 2020-05-17 ENCOUNTER — Encounter (HOSPITAL_BASED_OUTPATIENT_CLINIC_OR_DEPARTMENT_OTHER): Payer: Medicaid Other | Admitting: Internal Medicine

## 2020-05-17 ENCOUNTER — Other Ambulatory Visit: Payer: Self-pay

## 2020-05-17 DIAGNOSIS — C50412 Malignant neoplasm of upper-outer quadrant of left female breast: Secondary | ICD-10-CM | POA: Diagnosis not present

## 2020-05-20 ENCOUNTER — Other Ambulatory Visit: Payer: Self-pay

## 2020-05-20 ENCOUNTER — Other Ambulatory Visit: Payer: Self-pay | Admitting: Oncology

## 2020-05-20 ENCOUNTER — Encounter (HOSPITAL_BASED_OUTPATIENT_CLINIC_OR_DEPARTMENT_OTHER): Payer: Medicaid Other | Admitting: Internal Medicine

## 2020-05-20 ENCOUNTER — Other Ambulatory Visit (HOSPITAL_COMMUNITY): Payer: Self-pay

## 2020-05-20 DIAGNOSIS — C50412 Malignant neoplasm of upper-outer quadrant of left female breast: Secondary | ICD-10-CM | POA: Diagnosis not present

## 2020-05-20 NOTE — Telephone Encounter (Signed)
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  Counseled patient on administration, dosing, side effects, monitoring, drug-food interactions, safe handling, storage, and disposal.  Patient will take Lynparza 165m tablets, 2 tablets (3061m by mouth 2 times daily without regard to food.  Patient counseled that administering with food may increase tolerability.  Patient knows to avoid grapefruit or grapefruit juice while on therapy with Lynparza.  LyLonie Peaktart date: tentatively 05/23/20 - patient knows not to start until directed by Dr. MaJana Hakim Adverse effects include but are not limited to: nausea, vomiting, diarrhea, taste changes, mouth sores, fatigue, constipation, decreased blood counts, and joint pain. Patient informed that pneumonitis (including some fatalities) has occurred rarely.  Myelodysplastic syndrome/acute myeloid leukemia (MDS/AML) have been reported (rarely) in clinical trials.  Patient has anti-emetic on hand and knows to take it if nausea develops.   Patient will obtain anti diarrheal and alert the office of 4 or more loose stools above baseline.  Reviewed with patient importance of keeping a medication schedule and plan for any missed doses. No barriers to medication adherence identified.  Medication reconciliation performed and medication/allergy list updated.  Insurance authorization for LyLonie Peakas been obtained. Test claim at the pharmacy revealed copayment $0 for 1st fill of Lynparza. Patient will pick this up from the [facility] 05/20/20.   Patient informed the pharmacy will reach out 5-7 days prior to needing next fill of Lynparza to coordinate continued medication acquisition to prevent break in therapy.  All  questions answered.  Ms. AbMceachernoiced understanding and appreciation.   Medication education handout placed in mail for patient. Patient knows to call the office with questions or concerns. Oral Chemotherapy Clinic phone number provided to patient.   ReLeron CroakPharmD, BCPS Hematology/Oncology Clinical Pharmacist [phone]/16/2022 12:25 PM

## 2020-05-21 ENCOUNTER — Other Ambulatory Visit: Payer: Self-pay

## 2020-05-21 ENCOUNTER — Encounter (HOSPITAL_BASED_OUTPATIENT_CLINIC_OR_DEPARTMENT_OTHER): Payer: Medicaid Other | Admitting: Internal Medicine

## 2020-05-21 ENCOUNTER — Other Ambulatory Visit (HOSPITAL_COMMUNITY): Payer: Self-pay

## 2020-05-21 DIAGNOSIS — Z9221 Personal history of antineoplastic chemotherapy: Secondary | ICD-10-CM | POA: Insufficient documentation

## 2020-05-21 DIAGNOSIS — T8131XS Disruption of external operation (surgical) wound, not elsewhere classified, sequela: Secondary | ICD-10-CM | POA: Insufficient documentation

## 2020-05-21 DIAGNOSIS — Z9013 Acquired absence of bilateral breasts and nipples: Secondary | ICD-10-CM | POA: Insufficient documentation

## 2020-05-21 DIAGNOSIS — C50412 Malignant neoplasm of upper-outer quadrant of left female breast: Secondary | ICD-10-CM | POA: Insufficient documentation

## 2020-05-21 DIAGNOSIS — Z923 Personal history of irradiation: Secondary | ICD-10-CM | POA: Insufficient documentation

## 2020-05-21 DIAGNOSIS — Z148 Genetic carrier of other disease: Secondary | ICD-10-CM | POA: Insufficient documentation

## 2020-05-21 DIAGNOSIS — Z79811 Long term (current) use of aromatase inhibitors: Secondary | ICD-10-CM | POA: Insufficient documentation

## 2020-05-21 DIAGNOSIS — Z171 Estrogen receptor negative status [ER-]: Secondary | ICD-10-CM | POA: Insufficient documentation

## 2020-05-21 NOTE — Progress Notes (Addendum)
Sarah, Khan ([id number]) Visit Report for 05/21/2020 HPI Details Patient Name: Date of Service: [name]. 05/21/2020 7:30 Sarah M Medical Record Number: [id number] Patient Account Number: [id number] Date of Birth/Sex: Treating RN: July 22, 1970 (50 y.o. [name], [name] Primary Care Provider: [name] Other Clinician: Referring Provider: Treating Provider/Extender: [name] in Treatment: 6 History of Present Illness HPI Description: ADMISSION 04/09/2020 [name] 50 year old woman who is history of breast cancer in the left breast goes back to 2018. He was found to have Sarah mass in her left breast. [name] and then subsequent chemo that she did not tolerate very well. She had seed implants placed but then underwent external beam Sarah radiation in the spring 2019. 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 Her adjunctive radiation was from 03/11/2017 through 04/21/2017. Unfortunately she had Sarah left breast biopsy on 11/24/2019 and it was again positive for an invasive ductal carcinoma grade 3 he is to resume at receptor 80% positive. HER-2 negative progesterone negative. She opted for bilateral mastectomies which were done on 01/09/2020 on the right with no evidence of malignancy on the left stage IIb invasive ductal carcinoma. She again received chemotherapy but she did not tolerate it because of wound dehiscence and low platelet counts according to the patient. Unfortunately her mastectomy wound dehisced on the left breast. She was taken back to the OR by [name] of general surgery on 33/24/22 and she underwent Sarah debridement and subsequent attempt at closure. She had been previously treated with Augmentin for suspected underlying infection. Once again the wound has dehisced in the mid  aspect. It is roughly 1 cm deep there is tunnels both medially and laterally she. She still has sutures in place. Still describes constant pain drainage. She also has Sarah drain still in place that drains about 10 cc/day. Currently she is changing ABD pad several times Sarah day 4/12; patient went back to see [name] who remove most of the stitches and debrided her wound. She has been using silver alginate culture I did last time which was Sarah PCR culture showed Peptostreptococcus, Enterococcus faecalis and Staphylococcus epidermidis which is of questionable significance. [name] that the sutures have been removed. There are undermining areas superior medial superior lateral and Sarah deep tunneling area in the inferior part of the wound. There is no obvious purulence. I started her on Augmentin on 04/12/2020 and I will likely extend this for another week AMAZINGLY; she is already been approved for HBO and will start today. [name] Echocardiogram which I reviewed from 2019 showed Sarah normal ejection fraction.. I went over side effects of HBO with the patient and answered any of her questions. Her husband was present 5/3; [name]. She went to see [name] about 2-1/2 weeks ago according to the patient he can remove some of the surrounding debris. She will see him again later this week or early next week. She has been using silver alginate. Sarah lot of this is problematic since she has Medicaid which traditionally does not cover wound care supplies although it traditionally does not cover HBO either Her wound actually looks quite Sarah bit better than the last time I saw this better looking surface healthy looking  tissue. She has tunnels at 810 2 in 6 the deepest of these is at 2 at 2.2 cm there is no exposed bone. 5/10; the patient was coming in for nurse visit today but requested that I see the wound because of surface discoloration. She  states the Iodoflex makes the surface of this turned "white". She is not really complaining of undue tenderness. She is changing this every second day. The goal of this had been to get the surface of the wound to Sarah vibrant red color and then potentially look at changing to Sarah silver collagen-based dressing, Puraply, and then perhaps an alternative skin substituteo Derma vest. The patient tells me she has an appoint with her oncologist [name] on the 19th. She apparently think she is going to get another PET scan. Based on this I would like his comment about whether there is possibility of any malignancy in the wound itself or just underneath it. Up to now we have not really thought that was true 5/17; nice improvement in the chest wound. Less depth. We have been using Iodoflex to clean off the wound surface this seems to have done nicely. Patient went to see [name] 2 days ago who has referred her to see Dr. Claudia Desanctis. I certainly agree with the thought the surface of the wound looks Sarah lot better now and I think consideration of plastic surgery is certainly something the patient should discuss. I have talked to the patient about this today and I certainly support Sarah conversation with Dr. Claudia Desanctis. I also wondered about is there any residual cancer in this. [name] apparently did not seem to think so per the patient although I do not see that in the text conversations we have had. I also wonder about Sarah skin substitute if her version of Medicare [BCCP] would cover this. [name] is apparently considering starting her on [place]. Patient is already very scared about the side effects but she will discuss this with [name] on 5/19 Electronic Signature(s) Signed: 05/21/2020 4:16:43 PM By: [name] MD Entered By: [name] on 05/21/2020 08:39:03 -------------------------------------------------------------------------------- Physical Exam Details Patient Name: Date of  Service: Sarah Crochet V [name]. 05/21/2020 7:30 Sarah M Medical Record Number: [id number] Patient Account Number: [id number] Date of Birth/Sex: Treating RN: 1970-04-12 (50 y.o. [name], [name] Primary Care Provider: [name] Other Clinician: Referring Provider: Treating Provider/Extender: [name] in Treatment: 6 Constitutional Sitting or standing Blood Pressure is within target range for patient.. Pulse regular and within target range for patient.Marland Kitchen Respirations regular, non-labored and within target range.. Temperature is normal and within the target range for the patient.Marland Kitchen Appears in no distress. Notes Wound exam; nice improvement in the tissue here. The tunnels have come in Sarah bit 1 medially and 1 laterally. There are still some debris on the wound surface but no debridement is required granulation tissue has come in nicely Electronic Signature(s) Signed: 05/21/2020 4:16:43 PM By: [name] MD Entered By: [name] on 05/21/2020 08:39:46 -------------------------------------------------------------------------------- Physician Orders Details Patient Name: Date of Service: Sarah Crochet V [name]. 05/21/2020 7:30 Sarah M Medical Record Number: [id number] Patient Account Number: [id number] Date of Birth/Sex: Treating RN: [birth year] (50 y.o. [name], [name] Primary Care Provider: [name] Other Clinician: Referring Provider: Treating Provider/Extender: [name] in Treatment: 6 Verbal / Phone Orders: No Diagnosis Coding Follow-up Appointments ppointment in 1 week. - with Dr. Dellia Nims Return Sarah Bathing/  Shower/ Hygiene May shower with protection but do not get wound dressing(s) wet. Additional Orders / Instructions Follow Nutritious Diet Hyperbaric Oxygen Therapy Evaluate for HBO Therapy Indication: - soft tissue radionecrosis 2.5 ATA for 90 Minutes with 2 Five (5) Minute Sarah Breaks ir Total Number  of Treatments: - 40 One treatments per day (delivered Monday through Friday unless otherwise specified in Special Instructions below): Afrin (Oxymetazoline HCL) 0.05% nasal spray - 1 spray in both nostrils daily as needed prior to HBO treatment for difficulty clearing ears Wound Treatment Wound #1 - Chest Wound Laterality: Left Cleanser: Normal Saline (Generic) 1 x Per Day/15 Days Discharge Instructions: Cleanse the wound with Normal Saline prior to applying Sarah clean dressing using gauze sponges, not tissue or cotton balls. Prim Dressing: IODOFLEX 0.9% Cadexomer Iodine Pad 4x6 cm (Generic) 1 x Per Day/15 Days ary Discharge Instructions: Apply to wound bed as instructed Prim Dressing: 1 x Per Day/15 Days ary Secondary Dressing: Zetuvit Plus 4x4 in (Generic) 1 x Per Day/15 Days Discharge Instructions: Apply over primary dressing as directed. Add-Ons: Byram (Generic) 1 x Per Day/15 Days Add-Ons: 1 x Per QBH/41 Days Electronic Signature(s) Signed: 05/21/2020 4:16:43 PM By: [name] MD Signed: 05/21/2020 5:09:03 PM By: Rhae Hammock RN Entered By: Rhae Hammock on 05/21/2020 08:10:44 -------------------------------------------------------------------------------- Problem List Details Patient Name: Date of Service: Sarah Crochet V [name]. 05/21/2020 7:30 Sarah M Medical Record Number: [id number] Patient Account Number: [id number] Date of Birth/Sex: Treating RN: [birth year] (50 y.o. 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 04/09/2020 No Yes T81.31XD Disruption of external operation (surgical) wound, not elsewhere classified, 04/09/2020 No Yes subsequent encounter 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 and  subcutaneous tissue related to 04/09/2020 No Yes radiation Z90.10 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 04/09/2020 No Yes Inactive Problems Resolved Problems Electronic Signature(s) Signed: 05/21/2020 4:16:43 PM By: [name] MD Entered By: [name] on 05/21/2020 08:34:08 -------------------------------------------------------------------------------- Progress Note Details Patient Name: Date of Service: Sarah Crochet V [name]. 05/21/2020 7:30 Sarah M Medical Record Number: [id number] Patient Account Number: [id number] Date of Birth/Sex: Treating RN: Jun 01, 1970 (51 y.o. [name], [name] Primary Care Provider: [name] Other Clinician: Referring Provider: Treating Provider/Extender: [name] in Treatment: 6 Subjective History of Present Illness (HPI) ADMISSION 04/09/2020 [name] 50 year old woman who is history of breast cancer in the left breast goes back to 2018. He was found to have Sarah mass in her left breast. [name] and then subsequent chemo that she did not tolerate very well. She had seed implants placed but then underwent external beam Sarah radiation in the spring 2019. 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 Her adjunctive radiation was from 03/11/2017 through 04/21/2017. Unfortunately she had Sarah left breast biopsy on 11/24/2019 and it was again positive for an invasive ductal carcinoma grade 3 he is to resume at receptor 80% positive. HER-2 negative progesterone negative. She opted for bilateral mastectomies which were done on 01/09/2020 on the right with no evidence of malignancy on the left stage IIb invasive ductal carcinoma. She again received chemotherapy but she did not tolerate it  because of wound dehiscence and low platelet counts according to  the patient. Unfortunately her mastectomy wound dehisced on the left breast. She was taken back to the OR by [name] of general surgery on 33/24/22 and she underwent Sarah debridement and subsequent attempt at closure. She had been previously treated with Augmentin for suspected underlying infection. Once again the wound has dehisced in the mid aspect. It is roughly 1 cm deep there is tunnels both medially and laterally she. She still has sutures in place. Still describes constant pain drainage. She also has Sarah drain still in place that drains about 10 cc/day. Currently she is changing ABD pad several times Sarah day 4/12; patient went back to see [name] who remove most of the stitches and debrided her wound. She has been using silver alginate culture I did last time which was Sarah PCR culture showed Peptostreptococcus, Enterococcus faecalis and Staphylococcus epidermidis which is of questionable significance. [name] that the sutures have been removed. There are undermining areas superior medial superior lateral and Sarah deep tunneling area in the inferior part of the wound. There is no obvious purulence. I started her on Augmentin on 04/12/2020 and I will likely extend this for another week AMAZINGLY; she is already been approved for HBO and will start today. [name] Echocardiogram which I reviewed from 2019 showed Sarah normal ejection fraction.. I went over side effects of HBO with the patient and answered any of her questions. Her husband was present 5/3; [name]. She went to see [name] about 2-1/2 weeks ago according to the patient he can remove some of the surrounding debris. She will see him again later this week or early next week. She has been using silver alginate. Sarah lot of this is problematic since she has Medicaid which traditionally does not cover wound care supplies although it traditionally does not cover HBO  either Her wound actually looks quite Sarah bit better than the last time I saw this better looking surface healthy looking tissue. She has tunnels at 810 2 in 6 the deepest of these is at 2 at 2.2 cm there is no exposed bone. 5/10; the patient was coming in for nurse visit today but requested that I see the wound because of surface discoloration. She states the Iodoflex makes the surface of this turned "white". She is not really complaining of undue tenderness. She is changing this every second day. The goal of this had been to get the surface of the wound to Sarah vibrant red color and then potentially look at changing to Sarah silver collagen-based dressing, Puraply, and then perhaps an alternative skin substituteo Derma vest. The patient tells me she has an appoint with her oncologist [name] on the 19th. She apparently think she is going to get another PET scan. Based on this I would like his comment about whether there is possibility of any malignancy in the wound itself or just underneath it. Up to now we have not really thought that was true 5/17; nice improvement in the chest wound. Less depth. We have been using Iodoflex to clean off the wound surface this seems to have done nicely. Patient went to see [name] 2 days ago who has referred her to see Dr. Claudia Desanctis. I certainly agree with the thought the surface of the wound looks Sarah lot better now and I think consideration of plastic surgery is certainly  something the patient should discuss. I have talked to the patient about this today and I certainly support Sarah conversation with Dr. Claudia Desanctis. I also wondered about is there any residual cancer in this. [name] apparently did not seem to think so per the patient although I do not see that in the text conversations we have had. I also wonder about Sarah skin substitute if her version of Medicare [BCCP] would cover this. [name] is apparently considering starting her on [place]. Patient is already  very scared about the side effects but she will discuss this with [name] on 5/19 Objective Constitutional Sitting or standing Blood Pressure is within target range for patient.. Pulse regular and within target range for patient.Marland Kitchen Respirations regular, non-labored and within target range.. Temperature is normal and within the target range for the patient.Marland Kitchen Appears in no distress. Vitals Time Taken: 7:44 AM, Height: 67 in, Weight: 257 lbs, BMI: 40.2, Temperature: 98.4 F, Pulse: 88 bpm, Respiratory Rate: 16 breaths/min, Blood Pressure: 124/85 mmHg. General Notes: Wound exam; nice improvement in the tissue here. The tunnels have come in Sarah bit 1 medially and 1 laterally. There are still some debris on the wound surface but no debridement is required granulation tissue has come in nicely Integumentary (Hair, Skin) Wound #1 status is Open. Original cause of wound was Surgical Injury. The date acquired was: 01/16/2020. The wound has been in treatment 6 weeks. The wound is located on the Left Chest. The wound measures 5cm length x 4.4cm width x 2cm depth; 17.279cm^2 area and 34.558cm^3 volume. There is Fat Layer (Subcutaneous Tissue) exposed. There is no undermining noted, however, there is tunneling at 11:00 with Sarah maximum distance of 1.5cm. There is additional tunneling at 9:00 with Sarah maximum distance of 2.5cm, and at 2:00 with Sarah maximum distance of 3cm. There is Sarah medium amount of serosanguineous drainage noted. The wound margin is well defined and not attached to the wound base. There is medium (34-66%) red, pink granulation within the wound bed. There is Sarah medium (34-66%) amount of necrotic tissue within the wound bed including Adherent Slough. Assessment Active Problems ICD-10 Intraductal carcinoma in situ of left breast Disruption of external operation (surgical) wound, not elsewhere classified, subsequent encounter Other specified disorders of the skin and subcutaneous tissue related to  radiation 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 Plan Follow-up Appointments: Return Appointment in 1 week. - with Dr. Arcola Jansky Shower/ Hygiene: May shower with protection but do not get wound dressing(s) wet. Additional Orders / Instructions: Follow Nutritious Diet Hyperbaric Oxygen Therapy: Evaluate for HBO Therapy Indication: - soft tissue radionecrosis 2.5 ATA for 90 Minutes with 2 Five (5) Minute Air Breaks T Number of Treatments: - 40 otal One treatments per day (delivered Monday through Friday unless otherwise specified in Special Instructions below): Afrin (Oxymetazoline HCL) 0.05% nasal spray - 1 spray in both nostrils daily as needed prior to HBO treatment for difficulty clearing ears WOUND #1: - Chest Wound Laterality: Left Cleanser: Normal Saline (Generic) 1 x Per Day/15 Days Discharge Instructions: Cleanse the wound with Normal Saline prior to applying Sarah clean dressing using gauze sponges, not tissue or cotton balls. Prim Dressing: IODOFLEX 0.9% Cadexomer Iodine Pad 4x6 cm (Generic) 1 x Per Day/15 Days ary Discharge Instructions: Apply to wound bed as instructed Prim Dressing: 1 x Per Day/15 Days ary Secondary Dressing: Zetuvit Plus 4x4 in (Generic) 1 x Per Day/15 Days Discharge Instructions: Apply over primary dressing as directed. Add-Ons: Byram (Generic) 1  x Per DVI/17 Days Add-Ons: 1 x Per Day/15 Days 1. Nice improvement in the overall depth and condition of the wound bed 2. Continuing with Iodoflex and hyperbaric oxygen things are Sarah lot better. 3. I certainly support the idea of seeing plastic surgery for an attempt at flap closure. I would be interested in knowing when that surgery would be planned 4. [name] starting Si [place]. Not Sarah drug I am familiar with but I will look this up. I am still interested in the original question about whether anybody feels that this would need to be biopsied. Apparently [name] per the patient did not  feel that this needed to be done. Addendum 05/24/20 5 I think the patient is benefited greatly from HBO. I would like to get certification from her insurance to do the last 10 treatments she has in the current series. Electronic Signature(s) Signed: 05/24/2020 4:33:12 PM By: [name] MD Previous Signature: 05/21/2020 4:16:43 PM Version By: [name] MD Entered By: [name] on 05/24/2020 12:34:24 -------------------------------------------------------------------------------- SuperBill Details Patient Name: Date of Service: Sarah Crochet V [name]. 05/21/2020 Medical Record Number: [id number] Patient Account Number: [id number] Date of Birth/Sex: Treating RN: [birth year] (50 y.o. [name], [name] Primary Care Provider: [name] Other Clinician: Referring Provider: Treating Provider/Extender: [name] in Treatment: 6 Diagnosis Coding ICD-10 Codes Code Description [phone] Intraductal carcinoma in situ of left breast T81.31XD Disruption of external operation (surgical) wound, not elsewhere classified, subsequent encounter 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 Facility Procedures CPT4 Code: [id number] Description: 76548 - WOUND CARE VISIT-LEV 3 EST PT Modifier: Quantity: 1 Electronic Signature(s) Signed: 05/21/2020 4:16:43 PM By: [name] MD Signed: 05/21/2020 5:09:03 PM By: Rhae Hammock RN Entered By: Rhae Hammock on 05/21/2020 08:53:58

## 2020-05-22 ENCOUNTER — Encounter (HOSPITAL_BASED_OUTPATIENT_CLINIC_OR_DEPARTMENT_OTHER): Payer: Medicaid Other | Admitting: Physician Assistant

## 2020-05-22 ENCOUNTER — Other Ambulatory Visit: Payer: Self-pay | Admitting: Oncology

## 2020-05-22 ENCOUNTER — Other Ambulatory Visit: Payer: Self-pay

## 2020-05-22 DIAGNOSIS — C50412 Malignant neoplasm of upper-outer quadrant of left female breast: Secondary | ICD-10-CM | POA: Diagnosis not present

## 2020-05-22 NOTE — Progress Notes (Signed)
[name], [name] ([id number]) Visit Report for 05/16/2020 HBO Details Patient Name: Date of Service: [name] [name] 05/16/2020 8:00 A M Medical Record Number: [id number] Patient Account Number: [id number] Date of Birth/Sex: Treating RN: March 18, 1970 (50 y.o. 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 HBO Treatment Details Treatment Number: 22 Patient Type: Outpatient Chamber Type: Monoplace Chamber Serial #: [id number] Treatment Protocol: 2.5 ATA with 90 minutes oxygen, with two 5 minute air breaks Treatment Details Compression Rate Down: 2.0 psi / minute De-Compression Rate Up: 2.0 psi / minute A breaks and breathing ir Compress Tx Pressure periods Decompress Decompress Begins Reached (leave unused spaces Begins Ends blank) Chamber Pressure (ATA 1 2.5 2.5 2.5 2.5 2.5 - - 2.5 1 ) Clock Time (24 hr) 07:59 08:11 08:41 08:46 09:16 09:21 - - 09:51 10:03 Treatment Length: 124 (minutes) Treatment Segments: 4 Vital Signs Capillary Blood Glucose Reference Range: 80 - 120 mg / dl HBO Diabetic Blood Glucose Intervention Range: <131 mg/dl or >249 mg/dl Time Vitals Blood Respiratory Capillary Blood Glucose Pulse Action Type: Pulse: Temperature: Taken: Pressure: Rate: Glucose (mg/dl): Meter #: Oximetry (%) Taken: Pre 07:40 152/97 98 20 98 Post 10:03 123/83 86 18 98.1 Treatment Response Treatment Completion Status: Treatment Completed without Adverse Event Joannah Gitlin Notes No concerns with treatment given Physician HBO Attestation: I certify that I supervised this HBO treatment in accordance with Medicare guidelines. A trained  emergency response team is readily available per Yes hospital policies and procedures. Continue HBOT as ordered. Yes Electronic Signature(s) Signed: 05/16/2020 3:44:17 PM By: [name] MD Entered By: [name] on 05/16/2020 15:00:13 -------------------------------------------------------------------------------- HBO Safety Checklist Details Patient Name: Date of Service: [name] V [name] 05/16/2020 8:00 A M Medical Record Number: [id number] Patient Account Number: [id number] Date of Birth/Sex: Treating RN: [birth year] (50 y.o. [name] Primary Care [name]: [name] Other Clinician: Referring Georgian Mcclory: Treating Legrande Hao/Extender: [name] in Treatment: 5 HBO Safety Checklist Items Safety Checklist Consent Form Signed Patient voided / foley secured and emptied When did you last eato 0645 Last dose of injectable or oral agent na Ostomy pouch emptied and vented if applicable NA All implantable devices assessed, documented and approved Intravenous access site secured and place NA Valuables secured Linens and cotton and cotton/polyester blend (less than 51% polyester) Personal oil-based products / skin lotions / body lotions removed Wigs or hairpieces removed Smoking or tobacco materials removed Books / newspapers / magazines / loose paper removed Cologne, aftershave, perfume and deodorant removed Jewelry removed (may wrap wedding band) Make-up removed Hair care products removed Battery operated devices (external) removed Heating patches and chemical warmers removed Titanium eyewear removed Nail polish cured greater than 10 hours Casting material cured greater than 10 hours NA Hearing aids removed NA Loose dentures or partials removed NA Prosthetics have been removed NA Patient demonstrates correct use of air break device (if applicable) Patient concerns have been addressed Patient grounding bracelet on  and cord attached to chamber Specifics for Inpatients (complete in addition to above) Medication sheet sent with patient NA Intravenous medications needed or due during therapy sent with patient NA Drainage tubes (e.g. nasogastric tube or chest tube secured and vented) NA Endotracheal  or Tracheotomy tube secured NA Cuff deflated of air and inflated with saline NA Airway suctioned NA Electronic Signature(s) Signed: 05/22/2020 6:30:22 PM By: Levan Hurst RN, BSN Entered By: Levan Hurst on 05/16/2020 08:32:50

## 2020-05-22 NOTE — Progress Notes (Signed)
EARSIE, HUMM ([id number]) Visit Report for 05/21/2020 Arrival Information Details Patient Name: Date of Service: [name] 05/21/2020 8:00 A M Medical Record Number: [id number] Patient Account Number: [id number] Date of Birth/Sex: Treating RN: Aug 09, 1970 (50 y.o. [name], [name] Primary Care Jayel Scaduto: [name] Other Clinician: Referring Quilla Freeze: Treating Aubriel Khanna/Extender: [name] in Treatment: 6 Visit Information History Since Last Visit Added or deleted any medications: No Patient Arrived: Ambulatory Any new allergies or adverse reactions: No Arrival Time: 08:45 Had a fall or experienced change in No Accompanied By: alone activities of daily living that may affect Transfer Assistance: None risk of falls: Patient Identification Verified: Yes Signs or symptoms of abuse/neglect since last visito No Secondary Verification Process Completed: Yes Hospitalized since last visit: No Patient Requires Transmission-Based Precautions: No Implantable device outside of the clinic excluding No Patient Has Alerts: No cellular tissue based products placed in the center since last visit: Has Dressing in Place as Prescribed: Yes Pain Present Now: No Electronic Signature(s) Signed: 05/22/2020 6:30:22 PM By: Levan Hurst RN, BSN Entered By: Levan Hurst on 05/21/2020 09:54:35 -------------------------------------------------------------------------------- Encounter Discharge Information Details Patient Name: Date of Service: [name] 05/21/2020 8:00 A M Medical Record Number: [id number] Patient Account Number: [id number] Date of Birth/Sex: Treating RN: Dec 02, 1970 (50 y.o. [name] Primary Care Earlee Herald: [name] Other Clinician: Referring Rumeal Cullipher: Treating Kassity Woodson/Extender: [name] in Treatment: 6 Encounter Discharge Information Items Discharge Condition:  Stable Ambulatory Status: Ambulatory Discharge Destination: Home Transportation: Private Auto Accompanied By: alone Schedule Follow-up Appointment: Yes Clinical Summary of Care: Patient Declined Electronic Signature(s) Signed: 05/22/2020 6:30:22 PM By: Levan Hurst RN, BSN Entered By: Levan Hurst on 05/21/2020 11:19:55 -------------------------------------------------------------------------------- Vitals Details Patient Name: Date of Service: [name], [name] 05/21/2020 8:00 A M Medical Record Number: [id number] Patient Account Number: [id number] Date of Birth/Sex: Treating RN: 04-06-1970 (50 y.o. [name] Primary Care Laurisa Sahakian: [name] Other Clinician: Referring Keny Donald: Treating Sritha Chauncey/Extender: [name] in Treatment: 6 Vital Signs Time Taken: 08:45 Temperature (F): 98.4 Height (in): 67 Pulse (bpm): 88 Weight (lbs): 257 Respiratory Rate (breaths/min): 16 Body Mass Index (BMI): 40.2 Blood Pressure (mmHg): 124/85 Reference Range: 80 - 120 mg / dl Electronic Signature(s) Signed: 05/22/2020 6:30:22 PM By: Levan Hurst RN, BSN Entered By: Levan Hurst on 05/21/2020 09:54:50

## 2020-05-22 NOTE — Progress Notes (Signed)
Sarah, Khan ([id number]) Visit Report for 05/17/2020 SuperBill Details Patient Name: Date of Service: [name] 05/17/2020 Medical Record Number: [id number] Patient Account Number: [id number] Date of Birth/Sex: Treating RN: 07-25-1970 (50 y.o. 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 Disruption of external operation (surgical) wound, not elsewhere classified, subsequent encounter 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 Facility Procedures CPT4 Code Description Modifier Quantity 83291916 G0277-(Facility Use Only) HBOT full body chamber, 17min , 4 Physician Procedures Quantity CPT4 Code Description Modifier 6060045 99774 - WC PHYS HYPERBARIC OXYGEN THERAPY 1 ICD-10 Diagnosis Description 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

## 2020-05-22 NOTE — Progress Notes (Signed)
[name], [name] ([id number]) Visit Report for 05/20/2020 SuperBill Details Patient Name: Date of Service: [name] 05/20/2020 Medical Record Number: [id number] Patient Account Number: [id number] Date of Birth/Sex: Treating RN: Nov 27, 1970 (50 y.o. 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

## 2020-05-22 NOTE — Progress Notes (Signed)
JI, FAIRBURN ([id number]) Visit Report for 05/21/2020 Arrival Information Details Patient Name: Date of Service: [name] [name] 05/21/2020 7:30 Sarah M Medical Record Number: [id number] Patient Account Number: [id number] Date of Birth/Sex: Treating RN: [birth year] (50 y.o. [name], [name] Primary Care Manreet Kiernan: [name] Other Clinician: Referring Jaxden Blyden: Treating Fremon Zacharia/Extender: [name] in Treatment: 6 Visit Information History Since Last Visit Added or deleted any medications: No Patient Arrived: Ambulatory Any new allergies or adverse reactions: No Arrival Time: 07:43 Had Sarah fall or experienced change in No Accompanied By: husband activities of daily living that may affect Transfer Assistance: None risk of falls: Patient Identification Verified: Yes Signs or symptoms of abuse/neglect since last visito No Secondary Verification Process Completed: Yes Hospitalized since last visit: No Patient Requires Transmission-Based Precautions: No Implantable device outside of the clinic excluding No Patient Has Alerts: No cellular tissue based products placed in the center since last visit: Has Dressing in Place as Prescribed: Yes Pain Present Now: No Electronic Signature(s) Signed: 05/21/2020 10:08:57 AM By: Sandre Kitty Entered By: Sandre Kitty on 05/21/2020 07:44:47 -------------------------------------------------------------------------------- Clinic Level of Care Assessment Details Patient Name: Date of Service: [name] [name] 05/21/2020 7:30 Sarah M Medical Record Number: [id number] Patient Account Number: [id number] Date of Birth/Sex: Treating RN: [birth year] (50 y.o. [name], [name] Primary Care Giankarlo Leamer: [name] Other Clinician: Referring Brigida Scotti: Treating Desmund Elman/Extender: [name] in Treatment: 6 Clinic Level of Care Assessment Items TOOL 4 Quantity Score X- 1  0 Use when only an EandM is performed on FOLLOW-UP visit ASSESSMENTS - Nursing Assessment / Reassessment X- 1 10 Reassessment of Co-morbidities (includes updates in patient status) X- 1 5 Reassessment of Adherence to Treatment Plan ASSESSMENTS - Wound and Skin Sarah ssessment / Reassessment X - Simple Wound Assessment / Reassessment - one wound 1 5 _0  - 0 Complex Wound Assessment / Reassessment - multiple wounds X- 1 10 Dermatologic / Skin Assessment (not related to wound area) ASSESSMENTS - Focused Assessment _1  - 0 Circumferential Edema Measurements - multi extremities _2  - 0 Nutritional Assessment / Counseling / Intervention _3  - 0 Lower Extremity Assessment (monofilament, tuning fork, pulses) _4  - 0 Peripheral Arterial Disease Assessment (using hand held doppler) ASSESSMENTS - Ostomy and/or Continence Assessment and Care _5  - 0 Incontinence Assessment and Management _6  - 0 Ostomy Care Assessment and Management (repouching, etc.) PROCESS - Coordination of Care X - Simple Patient / Family Education for ongoing care 1 15 _7  - 0 Complex (extensive) Patient / Family Education for ongoing care X- 1 10 Staff obtains Programmer, systems, Records, T Results / Process Orders est _8  - 0 Staff telephones HHA, Nursing Homes / Clarify orders / etc _9  - 0 Routine Transfer to another Facility (non-emergent condition) _10  - 0 Routine Hospital Admission (non-emergent condition) _11  - 0 New Admissions / Biomedical engineer / Ordering NPWT Apligraf, etc. , _12  - 0 Emergency Hospital Admission (emergent condition) X- 1 10 Simple Discharge Coordination _13  - 0 Complex (extensive) Discharge Coordination PROCESS - Special Needs _14  - 0 Pediatric / Minor Patient Management _15  - 0 Isolation Patient Management _16  - 0 Hearing / Language / Visual special needs _17  - 0 Assessment of Community assistance (transportation, D/C planning, etc.) _18  - 0 Additional assistance / Altered mentation _19  -  0 Support Surface(s) Assessment (bed, cushion, seat, etc.) INTERVENTIONS - Wound Cleansing / Measurement X - Simple Wound Cleansing - one wound 1 5 _20  -  0 Complex Wound Cleansing - multiple wounds X- 1 5 Wound Imaging (photographs - any number of wounds) _0  - 0 Wound Tracing (instead of photographs) X- 1 5 Simple Wound Measurement - one wound _1  - 0 Complex Wound Measurement - multiple wounds INTERVENTIONS - Wound Dressings X - Small Wound Dressing one or multiple wounds 1 10 _2  - 0 Medium Wound Dressing one or multiple wounds _3  - 0 Large Wound Dressing one or multiple wounds X- 1 5 Application of Medications - topical <[id number]>_9<\/GXQJJHERDEYCXKGY>_1  - 0 Application of Medications - injection INTERVENTIONS - Miscellaneous _5  - 0 External ear exam _6  - 0 Specimen Collection (cultures, biopsies, blood, body fluids, etc.) _7  - 0 Specimen(s) / Culture(s) sent or taken to Lab for analysis _8  - 0 Patient Transfer (multiple staff / Civil Service fast streamer / Similar devices) _9  - 0 Simple Staple / Suture removal (25 or less) _10  - 0 Complex Staple / Suture removal (26 or more) _11  - 0 Hypo / Hyperglycemic Management (close monitor of Blood Glucose) _12  - 0 Ankle / Brachial Index (ABI) - do not check if billed separately X- 1 5 Vital Signs Has the patient been seen at the hospital within the last three years: Yes Total Score: 100 Level Of Care: New/Established - Level 3 Electronic Signature(s) Signed: 05/21/2020 5:09:03 PM By: Rhae Hammock RN Entered By: Rhae Hammock on 05/21/2020 08:53:52 -------------------------------------------------------------------------------- Encounter Discharge Information Details Patient Name: Date of Service: [name] [name] 05/21/2020 7:30 Sarah M Medical Record Number: [id number] Patient Account Number: [id number] Date of Birth/Sex: Treating RN: 06/21/1970 (50 y.o. [name] Primary Care Mylo Choi: [name] Other Clinician: Referring Nicolis Boody: Treating  Aydee Mcnew/Extender: [name] in Treatment: 6 Encounter Discharge Information Items Discharge Condition: Stable Ambulatory Status: Ambulatory Discharge Destination: Home Transportation: Private Auto Accompanied By: Husband Schedule Follow-up Appointment: Yes Clinical Summary of Care: Provided on 05/21/2020 Form Type Recipient Paper Patient Patient Electronic Signature(s) Signed: 05/21/2020 8:28:58 AM By: Lorrin Jackson Entered By: Lorrin Jackson on 05/21/2020 08:28:58 -------------------------------------------------------------------------------- Lower Extremity Assessment Details Patient Name: Date of Service: [name] [name] 05/21/2020 7:30 Sarah M Medical Record Number: [id number] Patient Account Number: [id number] Date of Birth/Sex: Treating RN: 1970-07-27 (50 y.o. [name] Primary Care Sharnell Knight: [name] Other Clinician: Referring Rebbie Lauricella: Treating Willene Holian/Extender: [name] in Treatment: 6 Electronic Signature(s) Signed: 05/21/2020 5:10:48 PM By: Lorrin Jackson Entered By: Lorrin Jackson on 05/21/2020 07:46:21 -------------------------------------------------------------------------------- Multi Wound Chart Details Patient Name: Date of Service: [name] [name] 05/21/2020 7:30 Sarah M Medical Record Number: [id number] Patient Account Number: [id number] Date of Birth/Sex: Treating RN: 01/11/1970 (50 y.o. [name], [name] Primary Care Sakia Schrimpf: [name] Other Clinician: Referring Gina Leblond: Treating Reagen Haberman/Extender: [name] in Treatment: 6 Vital Signs Height(in): 67 Pulse(bpm): 88 Weight(lbs): 867 Blood Pressure(mmHg): 124/85 Body Mass Index(BMI): 40 Temperature(F): 98.4 Respiratory Rate(breaths/min): 16 Photos: [1:No Photos Left Chest] [N/Sarah:N/Sarah N/Sarah] Wound Location: [1:Surgical Injury] [N/Sarah:N/Sarah] Wounding Event: [1:Open Surgical Wound]  [N/Sarah:N/Sarah] Primary Etiology: [1:Necrosis (Radiation)] [N/Sarah:N/Sarah] Secondary Etiology: [1:Hypertension, Osteoarthritis, ReceivedN/Sarah] Comorbid History: [1:Chemotherapy, Received Radiation 01/16/2020] [N/Sarah:N/Sarah] Date Acquired: [1:6] [N/Sarah:N/Sarah] Weeks of Treatment: [1:Open] [N/Sarah:N/Sarah] Wound Status: [1:5x4.4x2] [N/Sarah:N/Sarah] Measurements L x W x D (cm) [1:17.279] [N/Sarah:N/Sarah] Sarah (cm) : rea [1:34.558] [N/Sarah:N/Sarah] Volume (cm) : [1:-171.60%] [N/Sarah:N/Sarah] % Reduction in Sarah rea: [1:-393.80%] [N/Sarah:N/Sarah] % Reduction in Volume: [1:11] Position 1 (o'clock): [1:1.5] Maximum Distance 1 (cm): [1:9] Position 2 (o'clock): [1:2.5] Maximum  Distance 2 (cm): [1:2] Position 3 (o'clock): [1:3] Maximum Distance 3 (cm): [1:Yes] [N/Sarah:N/Sarah] Tunneling: [1:Full Thickness Without Exposed] [N/Sarah:N/Sarah] Classification: [1:Support Structures Medium] [N/Sarah:N/Sarah] Exudate Amount: [1:Serosanguineous] [N/Sarah:N/Sarah] Exudate Type: [1:red, brown] [N/Sarah:N/Sarah] Exudate Color: [1:Well defined, not attached] [N/Sarah:N/Sarah] Wound Margin: [1:Medium (34-66%)] [N/Sarah:N/Sarah] Granulation Amount: [1:Red, Pink] [N/Sarah:N/Sarah] Granulation Quality: [1:Medium (34-66%)] [N/Sarah:N/Sarah] Necrotic Amount: [1:Fat Layer (Subcutaneous Tissue): Yes N/Sarah] Exposed Structures: [1:Fascia: No Tendon: No Muscle: No Joint: No Bone: No None] [N/Sarah:N/Sarah] Treatment Notes Wound #1 (Chest) Wound Laterality: Left Cleanser Normal Saline Discharge Instruction: Cleanse the wound with Normal Saline prior to applying Sarah clean dressing using gauze sponges, not tissue or cotton balls. Peri-Wound Care Topical Primary Dressing IODOFLEX 0.9% Cadexomer Iodine Pad 4x6 cm Discharge Instruction: Apply to wound bed as instructed Secondary Dressing Zetuvit Plus 4x4 in Discharge Instruction: Apply over primary dressing as directed. Secured With Compression Wrap Compression Stockings Add-Ons Education officer, community) Signed: 05/21/2020 4:16:43 PM By: [name] MD Signed: 05/21/2020 5:09:03 PM By: Rhae Hammock RN Entered By: [name] on 05/21/2020 08:34:15 -------------------------------------------------------------------------------- Multi-Disciplinary Care Plan Details Patient Name: Date of Service: [name] [name] 05/21/2020 7:30 Sarah M Medical Record Number: [id number] Patient Account Number: [id number] Date of Birth/Sex: Treating RN: [birth year] (50 y.o. [name], [name] Primary Care Oyuki Hogan: [name] Other Clinician: Referring Jennings Stirling: Treating Savina Olshefski/Extender: [name] in Treatment: 6 Active Inactive Wound/Skin Impairment Nursing Diagnoses: Impaired tissue integrity Knowledge deficit related to smoking impact on wound healing Knowledge deficit related to ulceration/compromised skin integrity Goals: Patient will demonstrate Sarah reduced rate of smoking or cessation of smoking Date Initiated: 04/09/2020 Target Resolution Date: 06/28/2020 Goal Status: Active Patient will have Sarah decrease in wound volume by X% from date: (specify in notes) Date Initiated: 04/09/2020 Target Resolution Date: 05/31/2020 Goal Status: Active Patient/caregiver will verbalize understanding of skin care regimen Date Initiated: 04/09/2020 Date Inactivated: 05/14/2020 Target Resolution Date: 05/14/2020 Goal Status: Met Ulcer/skin breakdown will have Sarah volume reduction of 30% by week 4 Date Initiated: 04/09/2020 Date Inactivated: 05/14/2020 Target Resolution Date: 05/11/2020 Unmet Reason: see wound Goal Status: Unmet meaurements. Interventions: Assess patient/caregiver ability to obtain necessary supplies Assess patient/caregiver ability to perform ulcer/skin care regimen upon admission and as needed Assess ulceration(s) every visit Provide education on smoking Notes: Electronic Signature(s) Signed: 05/21/2020 5:09:03 PM By: Rhae Hammock RN Entered By: Rhae Hammock on 05/21/2020  08:10:07 -------------------------------------------------------------------------------- Pain Assessment Details Patient Name: Date of Service: [name] [name] 05/21/2020 7:30 Sarah M Medical Record Number: [id number] Patient Account Number: [id number] Date of Birth/Sex: Treating RN: 10-Dec-1970 (50 y.o. [name], [name] Primary Care Chava Dulac: [name] Other Clinician: Referring Nakeita Styles: Treating Mieke Brinley/Extender: [name] in Treatment: 6 Active Problems Location of Pain Severity and Description of Pain Patient Has Paino No Site Locations Pain Management and Medication Current Pain Management: Electronic Signature(s) Signed: 05/21/2020 10:08:57 AM By: Sandre Kitty Signed: 05/21/2020 5:09:03 PM By: Rhae Hammock RN Entered By: Sandre Kitty on 05/21/2020 07:45:17 -------------------------------------------------------------------------------- Patient/Caregiver Education Details Patient Name: Date of Service: [name] [name] 5/17/2022andnbsp7:30 Sarah M Medical Record Number: [id number] Patient Account Number: [id number] Date of Birth/Gender: Treating RN: Feb 07, 1970 (50 y.o. Benjaman Lobe Primary Care Physician: [name] Other Clinician: Referring Physician: Treating Physician/Extender: [name] in Treatment: 6 Education Assessment Education Provided To: Patient Education Topics Provided Smoking and Wound Healing: Methods: Explain/Verbal Responses: State content correctly Electronic Signature(s) Signed: 05/21/2020 5:09:03 PM  By: Rhae Hammock RN Entered By: Rhae Hammock on 05/21/2020 08:10:19 -------------------------------------------------------------------------------- Wound Assessment Details Patient Name: Date of Service: [name] [name] 05/21/2020 7:30 Sarah M Medical Record Number: [id number] Patient Account Number: [id number] Date of  Birth/Sex: Treating RN: [birth year] (50 y.o. [name] Primary Care Keshaun Dubey: [name] Other Clinician: Referring Aerionna Moravek: Treating Juliah Scadden/Extender: [name] in Treatment: 6 Wound Status Wound Number: 1 Primary Open Surgical Wound Etiology: Wound Location: Left Chest Secondary Necrosis (Radiation) Wounding Event: Surgical Injury Etiology: Date Acquired: 01/16/2020 Wound Status: Open Weeks Of Treatment: 6 Comorbid Hypertension, Osteoarthritis, Received Chemotherapy, Clustered Wound: No History: Received Radiation Photos Wound Measurements Length: (cm) 5 Width: (cm) 4.4 Depth: (cm) 2 Area: (cm) 17.279 Volume: (cm) 34.558 % Reduction in Area: -171.6% % Reduction in Volume: -393.8% Epithelialization: None Tunneling: Yes Location 1 Position (o'clock): 11 Maximum Distance: (cm) 1.5 Location 2 Position (o'clock): 9 Maximum Distance: (cm) 2.5 Location 3 Position (o'clock): 2 Maximum Distance: (cm) 3 Undermining: No Wound Description Classification: Full Thickness Without Exposed Support Structures Wound Margin: Well defined, not attached Exudate Amount: Medium Exudate Type: Serosanguineous Exudate Color: red, brown Foul Odor After Cleansing: No Slough/Fibrino Yes Wound Bed Granulation Amount: Medium (34-66%) Exposed Structure Granulation Quality: Red, Pink Fascia Exposed: No Necrotic Amount: Medium (34-66%) Fat Layer (Subcutaneous Tissue) Exposed: Yes Necrotic Quality: Adherent Slough Tendon Exposed: No Muscle Exposed: No Joint Exposed: No Bone Exposed: No Treatment Notes Wound #1 (Chest) Wound Laterality: Left Cleanser Normal Saline Discharge Instruction: Cleanse the wound with Normal Saline prior to applying Sarah clean dressing using gauze sponges, not tissue or cotton balls. Peri-Wound Care Topical Primary Dressing IODOFLEX 0.9% Cadexomer Iodine Pad 4x6 cm Discharge Instruction: Apply to wound bed as  instructed Secondary Dressing Zetuvit Plus 4x4 in Discharge Instruction: Apply over primary dressing as directed. Secured With Compression Wrap Compression Stockings Add-Ons Education officer, community) Signed: 05/21/2020 5:10:48 PM By: Lorrin Jackson Signed: 05/22/2020 11:07:31 AM By: Sandre Kitty Entered By: Sandre Kitty on 05/21/2020 16:09:27 -------------------------------------------------------------------------------- Vitals Details Patient Name: Date of Service: [name] [name] 05/21/2020 7:30 Sarah M Medical Record Number: [id number] Patient Account Number: [id number] Date of Birth/Sex: Treating RN: August 22, 1970 (50 y.o. [name], [name] Primary Care Hayward Rylander: [name] Other Clinician: Referring Mars Scheaffer: Treating Keerthana Vanrossum/Extender: [name] in Treatment: 6 Vital Signs Time Taken: 07:44 Temperature (F): 98.4 Height (in): 67 Pulse (bpm): 88 Weight (lbs): 257 Respiratory Rate (breaths/min): 16 Body Mass Index (BMI): 40.2 Blood Pressure (mmHg): 124/85 Reference Range: 80 - 120 mg / dl Electronic Signature(s) Signed: 05/21/2020 10:08:57 AM By: Sandre Kitty Entered By: Sandre Kitty on 05/21/2020 07:45:11

## 2020-05-22 NOTE — Progress Notes (Signed)
BERTINA, GUTHRIDGE ([id number]) Visit Report for 05/20/2020 HBO Details Patient Name: Date of Service: [name] 05/20/2020 8:00 A M Medical Record Number: [id number] Patient Account Number: [id number] Date of Birth/Sex: Treating RN: 04-Apr-1970 (50 y.o. 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 HBO Treatment Details Treatment Number: 24 Patient Type: Outpatient Chamber Type: Monoplace Chamber Serial #: [id number] Treatment Protocol: 2.5 ATA with 90 minutes oxygen, with two 5 minute air breaks Treatment Details Compression Rate Down: 2.0 psi / minute De-Compression Rate Up: 2.0 psi / minute A breaks and breathing ir Compress Tx Pressure periods Decompress Decompress Begins Reached (leave unused spaces Begins Ends blank) Chamber Pressure (ATA 1 2.5 2.5 2.5 2.5 2.5 - - 2.5 1 ) Clock Time (24 hr) 07:55 08:07 08:37 08:42 09:12 09:17 - - 09:47 09:59 Treatment Length: 124 (minutes) Treatment Segments: 4 Vital Signs Capillary Blood Glucose Reference Range: 80 - 120 mg / dl HBO Diabetic Blood Glucose Intervention Range: <131 mg/dl or >249 mg/dl Time Vitals Blood Respiratory Capillary Blood Glucose Pulse Action Type: Pulse: Temperature: Taken: Pressure: Rate: Glucose (mg/dl): Meter #: Oximetry (%) Taken: Pre 07:45 118/88 95 16 98.1 Post 09:59 114/96 84 16 97.5 Treatment Response Treatment Completion Status: Treatment Completed without Adverse Event Baraa Tubbs Notes No concerns with treatment given Physician HBO Attestation: I certify that I supervised this HBO treatment in accordance with Medicare guidelines. A trained  emergency response team is readily available per Yes hospital policies and procedures. Continue HBOT as ordered. Yes Electronic Signature(s) Signed: 05/21/2020 4:16:43 PM By: [name] MD Entered By: [name] on 05/20/2020 16:24:22 -------------------------------------------------------------------------------- HBO Safety Checklist Details Patient Name: Date of Service: [name], [name] 05/20/2020 8:00 A M Medical Record Number: [id number] Patient Account Number: [id number] Date of Birth/Sex: Treating RN: 1970/09/11 (50 y.o. [name] Primary Care Hargun Spurling: [name] Other Clinician: Referring Joven Mom: Treating Kateland Leisinger/Extender: [name] in Treatment: 5 HBO Safety Checklist Items Safety Checklist Consent Form Signed Patient voided / foley secured and emptied When did you last eato 0700 Last dose of injectable or oral agent na Ostomy pouch emptied and vented if applicable NA All implantable devices assessed, documented and approved Intravenous access site secured and place NA Valuables secured Linens and cotton and cotton/polyester blend (less than 51% polyester) Personal oil-based products / skin lotions / body lotions removed Wigs or hairpieces removed Smoking or tobacco materials removed Books / newspapers / magazines / loose paper removed Cologne, aftershave, perfume and deodorant removed Jewelry removed (may wrap wedding band) Make-up removed Hair care products removed Battery operated devices (external) removed Heating patches and chemical warmers removed Titanium eyewear removed Nail polish cured greater than 10 hours Casting material cured greater than 10 hours NA Hearing aids removed NA Loose dentures or partials removed Prosthetics have been removed NA Patient demonstrates correct use of air break device (if applicable) Patient concerns have been addressed Patient grounding bracelet on and  cord attached to chamber Specifics for Inpatients (complete in addition to above) Medication sheet sent with patient NA Intravenous medications needed or due during therapy sent with patient NA Drainage tubes (e.g. nasogastric tube or chest tube secured and vented) NA Endotracheal or  Tracheotomy tube secured NA Cuff deflated of air and inflated with saline NA Airway suctioned NA Electronic Signature(s) Signed: 05/22/2020 6:30:22 PM By: Levan Hurst RN, BSN Entered By: Levan Hurst on 05/20/2020 08:24:38

## 2020-05-22 NOTE — Progress Notes (Signed)
[name], [name] ([id number]) Visit Report for 05/17/2020 HBO Details Patient Name: Date of Service: [name] Sarah Sarah Reach V [name] 05/17/2020 8:00 Sarah M Medical Record Number: [id number] Patient Account Number: [id number] Date of Birth/Sex: Treating RN: [birth year] (50 y.o. 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 HBO Treatment Details Treatment Number: 23 Patient Type: Outpatient Chamber Type: Monoplace Chamber Serial #: [id number] Treatment Protocol: 2.5 ATA with 90 minutes oxygen, with two 5 minute air breaks Treatment Details Compression Rate Down: 2.0 psi / minute De-Compression Rate Up: 2.0 psi / minute Sarah breaks and breathing ir Compress Tx Pressure periods Decompress Decompress Begins Reached (leave unused spaces Begins Ends blank) Chamber Pressure (ATA 1 2.5 2.5 2.5 2.5 2.5 - - 2.5 1 ) Clock Time (24 hr) 07:52 08:04 08:34 08:39 09:09 09:14 - - 09:37 09:49 Treatment Length: 117 (minutes) Treatment Segments: 4 Vital Signs Capillary Blood Glucose Reference Range: 80 - 120 mg / dl HBO Diabetic Blood Glucose Intervention Range: <131 mg/dl or >249 mg/dl Time Vitals Blood Respiratory Capillary Blood Glucose Pulse Action Type: Pulse: Temperature: Taken: Pressure: Rate: Glucose (mg/dl): Meter #: Oximetry (%) Taken: Pre 07:40 123/64 98 18 98.1 Post 09:49 133/91 84 20 98.2 Treatment Response Treatment Completion Status: Treatment Completed without Adverse Event Treatment Notes Patient asked to be decompressed 7 minutes early due to being hot. Negar Sieler Notes No concerns with treatment given Physician HBO Attestation: I certify that I  supervised this HBO treatment in accordance with Medicare guidelines. Sarah trained emergency response team is readily available per Yes hospital policies and procedures. Continue HBOT as ordered. Yes Electronic Signature(s) Signed: 05/17/2020 5:00:10 PM By: [name] MD Entered By: [name] on 05/17/2020 15:57:16 -------------------------------------------------------------------------------- HBO Safety Checklist Details Patient Name: Date of Service: Sarah Crochet V [name] 05/17/2020 8:00 Sarah M Medical Record Number: [id number] Patient Account Number: [id number] Date of Birth/Sex: Treating RN: 16-Nov-1970 (50 y.o. [name] Primary Care Nasiya Pascual: [name] Other Clinician: Referring Perian Tedder: Treating Myriam Brandhorst/Extender: [name] in Treatment: 5 HBO Safety Checklist Items Safety Checklist Consent Form Signed Patient voided / foley secured and emptied When did you last eato 0700 Last dose of injectable or oral agent na Ostomy pouch emptied and vented if applicable NA All implantable devices assessed, documented and approved Intravenous access site secured and place NA Valuables secured Linens and cotton and cotton/polyester blend (less than 51% polyester) Personal oil-based products / skin lotions / body lotions removed Wigs or hairpieces removed Smoking or tobacco materials removed Books / newspapers / magazines / loose paper removed Cologne, aftershave, perfume and deodorant removed Jewelry removed (may wrap wedding band) Make-up removed Hair care products removed Battery operated devices (external) removed Heating patches and chemical warmers removed Titanium eyewear removed Nail polish cured greater than 10 hours Casting material cured greater than 10 hours NA Hearing aids removed NA Loose dentures or partials removed Prosthetics have been removed NA Patient demonstrates correct use of air break device (if  applicable) Patient concerns have been addressed Patient grounding bracelet on and cord attached to chamber Specifics for Inpatients (complete in addition to above) Medication sheet sent with patient NA Intravenous medications needed or due during therapy sent with patient NA  Drainage tubes (e.g. nasogastric tube or chest tube secured and vented) NA Endotracheal or Tracheotomy tube secured NA Cuff deflated of air and inflated with saline NA Airway suctioned NA Electronic Signature(s) Signed: 05/22/2020 6:30:22 PM By: Levan Hurst RN, BSN Entered By: Levan Hurst on 05/17/2020 10:55:55

## 2020-05-22 NOTE — Progress Notes (Signed)
Sarah, Khan ([id number]) Visit Report for 05/20/2020 Arrival Information Details Patient Name: Date of Service: [name] [name] 05/20/2020 8:00 Sarah M Medical Record Number: [id number] Patient Account Number: [id number] Date of Birth/Sex: Treating RN: [birth year] (50 y.o. [name], [name] Primary Care [name]: [name] Other Clinician: Referring [name]: Treating [name]/Extender: [name] in Treatment: 5 Visit Information History Since Last Visit Added or deleted any medications: No Patient Arrived: Ambulatory Any new allergies or adverse reactions: No Arrival Time: 07:45 Had Sarah fall or experienced change in No Accompanied By: alone activities of daily living that may affect Transfer Assistance: None risk of falls: Patient Identification Verified: Yes Signs or symptoms of abuse/neglect since last visito No Secondary Verification Process Completed: Yes Hospitalized since last visit: No Patient Requires Transmission-Based Precautions: No Implantable device outside of the clinic excluding No Patient Has Alerts: No cellular tissue based products placed in the center since last visit: Has Dressing in Place as Prescribed: Yes Pain Present Now: No Electronic Signature(s) Signed: 05/22/2020 6:30:22 PM By: Sarah Hurst RN, BSN Entered By: [name] on 05/20/2020 08:23:44 -------------------------------------------------------------------------------- Encounter Discharge Information Details Patient Name: Date of Service: [name] [name] 05/20/2020 8:00 Sarah M Medical Record Number: [id number] Patient Account Number: [id number] Date of Birth/Sex: Treating RN: 26-Oct-1970 (50 y.o. [name] Primary Care [name]: [name] Other Clinician: Referring [name]: Treating [name]/Extender: [name] in Treatment: 5 Encounter Discharge Information Items Discharge Condition:  Stable Ambulatory Status: Ambulatory Discharge Destination: Home Transportation: Private Auto Accompanied By: alone Schedule Follow-up Appointment: Yes Clinical Summary of Care: Patient Declined Electronic Signature(s) Signed: 05/22/2020 6:30:22 PM By: Sarah Hurst RN, BSN Entered By: [name] on 05/20/2020 13:02:59 -------------------------------------------------------------------------------- Vitals Details Patient Name: Date of Service: [name], [name] [name] 05/20/2020 8:00 Sarah M Medical Record Number: [id number] Patient Account Number: [id number] Date of Birth/Sex: Treating RN: 08/26/1970 (50 y.o. [name] Primary Care [name]: [name] Other Clinician: Referring [name]: Treating [name]/Extender: [name] in Treatment: 5 Vital Signs Time Taken: 07:45 Temperature (F): 98.1 Height (in): 67 Pulse (bpm): 95 Weight (lbs): 257 Respiratory Rate (breaths/min): 16 Body Mass Index (BMI): 40.2 Blood Pressure (mmHg): 118/88 Reference Range: 80 - 120 mg / dl Electronic Signature(s) Signed: 05/22/2020 6:30:22 PM By: Sarah Hurst RN, BSN Entered By: [name] on 05/20/2020 08:23:59

## 2020-05-22 NOTE — Progress Notes (Addendum)
[name]  [phone] [phone]     ID: EMORI MUMME DOB: [birth year]  MR#: [id number]  NID#:[id number]  Patient Care Team: Ladell Khan, [name], [name], Sarah as Consulting Physician (Oncology) [name], Sarah as Attending Physician (Radiation Oncology) Delice Bison Charlestine Massed, NP as Nurse Practitioner (Hematology and Oncology) Rolm Bookbinder, Sarah as Consulting Physician (General Surgery) Claudia Desanctis Steffanie Dunn, Sarah as Consulting Physician (Plastic Surgery) Mauro Kaufmann, RN as Oncology Nurse Navigator Rockwell Germany, RN as Oncology Nurse Navigator Milus Banister, Sarah as Attending Physician (Gastroenterology) Ricard Dillon, Sarah as Consulting Physician (Internal Medicine) OTHER Sarah:   CHIEF COMPLAINT: Triple negative breast cancer; BRCA2 positive  CURRENT TREATMENT: Hyperbaric treatment; olaparib to follow-up   INTERVAL HISTORY: [name] returns today for a follow-up and treatment of her triple negative breast cancer accompanied by her husband [name].   She received 1 cycle of cyclophosphamide, methotrexate, fluorouracil (CMF) 02/19/2020, but subsequent cycles have been held due to surgical wound dehiscence.  She is being treated intensively with hyperbaric oxygen therapy under Dr. Dellia Nims.  Treatment started 04/16/2020.  They are noting significant improvement, with many of the "tunnels" healing up, and the wound base itself now looking like it might be regenerating.  Her bilateral salpingo-oophorectomy and hysterectomy has been postponed until after the current issues have settled down.   REVIEW OF SYSTEMS: [name] is very optimistic that there is progress being made with the hyperbaric treatment.  She has been very reluctant to start olaparib until that is done and of course I have no data regarding using olaparib concurrently with hyperbaric treatment in this setting.  She is also concerned  regarding further surgery.  She tells me as soon as she had the last debridement it "blackened up again "and she is afraid the same thing might happen unless there is further healing of the wound.  She is noticing continuing disability in the left upper extremity, and she uses it very sparingly, certainly not carrying any weight with it.  She also has some difficulty with grabbing things with that hand.  Fortunately she is right-handed.  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  She has had no unusual headaches visual changes cough phlegm production pleurisy shortness of breath or any unusual headaches visual changes nausea or vomiting.  A detailed review of systems was otherwise stable.  COVID 19 VACCINATION STATUS: not vaccinated (as of 02/12/2020)   BREAST CANCER HISTORY: From the original intake note:  The patient was seen in the emergency room 05/16/2016 with nonspecific complaints but also noting that she had a tender lump in her lateral left breast which she said she had noted the day before. Exam by the emergency room physician confirmed a 3 cm soft left lateral breast mass without overlying erythema or nipple changes. This was felt to be most consistent with fibrocystic change but the patient was referred back to her primary care physician for further evaluation. She saw the physician assistant in May 18 and then Dr. Wynetta Emery on 06/12/2016 who scheduled a bilateral diagnostic mammography with tomography and left breast ultrasonography at the [facility] 06/18/2016. This found the breast density to be category B. In the left breast upper outer quadrant there was an area of asymmetry measuring up to 10.3 cm. On exam this was firm and palpable and on ultrasound there was an irregular mass measuring 2.5 cm with some subtle changes in the  surrounding tissue. In the left lower axilla there were 2 suspicious-looking lymph nodes.  Biopsy of the left breast upper outer quadrant and one of the  suspicious lymph nodes 06/19/2016 showed ([id number]) both to be involved by invasive ductal carcinoma, grade 3, estrogen and progesterone receptor negative, HER-2 not amplified with a signals ratio being 1.53-1.74 and the number per cell 2.35-3.05. The MIB-1 was 90%.  The patient's subsequent history is as detailed below.   PAST MEDICAL HISTORY: Past Medical History:  Diagnosis Date  . Arthritis   . Breast cancer ([place])    Left Breast Cancer  . Breast cancer ([place]) 11/24/2019   left   . Complication of anesthesia    DAY SURGERY 2010 OR 2011 ASPIRATED AND STAYED OVERNIGHT  . History of radiation therapy 03/11/17- 04/21/17   Left breast, 2 Gy in 25 fractions for a total dose of 50 Gy, Boost, 2 Gy in 5 fractions for a total dose of 10 Gy  . Hypertension    OFF MEDS  SINCE CHEMO X 5 MONTHS  . Malignant neoplasm of upper-outer quadrant of left breast in female, estrogen receptor negative ([place]) 06/23/2016  . Personal history of chemotherapy 2019   Left Breast Cancer  07-2016-12-2016  . Personal history of radiation therapy 2019   Left Breast Cancer    PAST SURGICAL HISTORY: Past Surgical History:  Procedure Laterality Date  . ANKLE SURGERY    . BREAST LUMPECTOMY Left 01/27/2017  . BREAST LUMPECTOMY WITH RADIOACTIVE SEED AND SENTINEL LYMPH NODE BIOPSY Left 01/27/2017   Procedure: LEFT BREAST LUMPECTOMY WITH RADIOACTIVE SEED LOCALIZATION, LEFT AXILLARY DEEP LYMPH NODE BIOPSY WITH RADIOACTIVE SEED LOCALIZATION, LEFT AXILLARY SENTINEL LYMPH NODE MAPPING AND BIOPSY WITH BLUE DYE INJECTION;  Surgeon: [name], Sarah;  Location: [facility];  Service: General;  Laterality: Left;  . COLONOSCOPY WITH PROPOFOL N/A 02/01/2020   Procedure: COLONOSCOPY WITH PROPOFOL;  Surgeon: Milus Banister, Sarah;  Location: [facility];  Service: Endoscopy;  Laterality: N/A;  . DEBRIDEMENT AND CLOSURE WOUND Left 03/28/2020   Procedure: DEBRIDEMENT AND CLOSURE OF MASTECTOMY WOUND;  Surgeon: Rolm Bookbinder, Sarah;   Location: [facility];  Service: General;  Laterality: Left;  . ENDOSCOPIC MUCOSAL RESECTION  02/01/2020   Procedure: ENDOSCOPIC MUCOSAL RESECTION;  Surgeon: Milus Banister, Sarah;  Location[facility];  Service: Endoscopy;;  . HEMOSTASIS CLIP PLACEMENT  02/01/2020   Procedure: HEMOSTASIS CLIP PLACEMENT;  Surgeon: Milus Banister, Sarah;  [facility];  Service: Endoscopy;;  . IR FLUORO GUIDE PORT INSERTION RIGHT  07/14/2016  . IR US GUIDE VASC ACCESS RIGHT  07/14/2016  . MASTECTOMY W/ SENTINEL NODE BIOPSY Bilateral 01/09/2020   Procedure: BILATERAL MASTECTOMY WITH LEFT AXILLARY SENTINEL LYMPH NODE BIOPSY;  Surgeon: Rolm Bookbinder, Sarah;  Location: [facility];  Service: General;  Laterality: Bilateral;  . PORT-A-CATH REMOVAL Right 01/27/2017   Procedure: REMOVAL PORT-A-CATH;  Surgeon: [name], Sarah;  Location: [facility];  Service: General;  Laterality: Right;  . PORTACATH PLACEMENT Right 01/09/2020   Procedure: INSERTION PORT-A-CATH;  Surgeon: Rolm Bookbinder, Sarah;  Location: [facility];  Service: General;  Laterality: Right;  . SUBMUCOSAL LIFTING INJECTION  02/01/2020   Procedure: SUBMUCOSAL LIFTING INJECTION;  Surgeon: Milus Banister, Sarah;  [facility];  Service: Endoscopy;;  . TUBAL LIGATION    . WISDOM TOOTH EXTRACTION      FAMILY HISTORY Family History  Problem Relation Age of Onset  . Diabetes Mother   . Hypertension Mother   .  Arthritis Mother   . Colon cancer Father 22       died .76 metastatic at time of diagnosis  . Diabetes Father   . Heart disease Father   . Hypertension Father   . Breast cancer Maternal Grandmother 24       d.60s  . Breast cancer Maternal Aunt 52  . Cervical cancer Maternal Aunt 33  . Breast cancer Maternal Aunt 46       d.50s  . Cancer Maternal Uncle        d.62s unspecified type of cancer  . Breast cancer Cousin 85       paternal first-cousin (daughter of unaffected aunt)  . Cancer Maternal Aunt 24        "Female Cancer"  . Cancer Maternal Aunt        unknown cancer  . Brain cancer Daughter 35  . Esophageal cancer Other   . Colon polyps Neg Hx   . Rectal cancer Neg Hx   . Stomach cancer Neg Hx   The patient's father died at age 3 from colon cancer which she never had treated. The patient's mother is living, 29 years old as of June 2018. The patient has one brother, one sister. On the mother's side there is a history of breast cancer in the grandmother, age 7, and then 1 aunt with breast cancer age 53, and another with lung cancer age 56. There is no history of ovarian cancer in the family.   GYNECOLOGIC HISTORY:  No LMP recorded (lmp unknown). Patient is postmenopausal. Menarche age 75, first live birth age 60. She is GX P4. Her periods have never been regular. She had a period in June 2018 but not for 4 months before that. She took birth control for some years remotely with no complications. She is status post bilateral tubal ligation.   SOCIAL HISTORY:  The patient's husband owns and runs a food truck.. They serve mostly [place] and [place] food. 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 Her husband Alica Shellhammer (goes by "[name]") is originally from [name]. The patient has two children from her first marriage. Her son [name], who is 47 years old as of Oct 2021, was previously in the [employer], but now is taking courses at [employer] and  her 59 y/o daughter [name], who was studying psychology at [name] before her optic pathway germinoma, which has left her 97% blind.  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 scholarship, doing premed) and 63 as of October 2020    ADVANCED DIRECTIVES: In the absence of any documents to the contrary the patient's husband is her healthcare power of  attorney   HEALTH MAINTENANCE: Social History   Tobacco Use  . Smoking status: Never Smoker  . Smokeless tobacco: Never Used  Vaping Use  . Vaping Use: Never used  Substance Use Topics  . Alcohol use: No  . Drug use: No     Colonoscopy: 02/01/2020  VZC:HYIF 2018  Bone density:Never   Allergies  Allergen Reactions  . Carboplatin Other (See Comments)    Red, blotchy, hot    Current Outpatient Medications  Medication Sig Dispense Refill  . anastrozole (ARIMIDEX) 1 MG tablet TAKE 1 TABLET BY MOUTH DAILY. 90 tablet 4  . gabapentin (NEURONTIN) 100 MG capsule TAKE 2 CAPSULES BY MOUTH  IN THE MORNING AND 2 CAPSULES IN THE AFTERNOON 120 capsule 3  . gabapentin (NEURONTIN) 300 MG capsule TAKE 2 CAPSULES BY MOUTH EVERY NIGHT AT BEDTIME 90 capsule 4  . HYDROcodone-acetaminophen (NORCO/VICODIN) 5-325 MG tablet TAKE 1/2-1 TABLET BY MOUTH EVERY 4 HOURS AS NEEDED FOR PAIN 40 tablet 0  . ibuprofen (ADVIL) 800 MG tablet TAKE 1 TABLET BY MOUTH 3 TIMES DAILY AS NEEDED 40 tablet 1  . lidocaine-prilocaine (EMLA) cream APPLY TO AFFECTED AREA ONCE AS DIRECTED (Patient taking differently: Apply 1 application topically See admin instructions. Apply to affected area prior to port access.) 30 g 3  . LORazepam (ATIVAN) 1 MG tablet Take 1 tablet by mouth once a day as needed 25 tablet 0  . olaparib (LYNPARZA) 150 MG tablet Take 2 tablets (300 mg total) by mouth 2 (two) times daily. Swallow whole. May take with food to decrease nausea and vomiting.Start May 23, 2020 120 tablet 6  . potassium chloride SA (KLOR-CON) 20 MEQ tablet TAKE 1 TABLET BY MOUTH ONCE A DAY 30 tablet 2  . prochlorperazine (COMPAZINE) 10 MG tablet TAKE 1 TABLET BY MOUTH EVERY 6 HOURS AS NEEDED FOR NAUSEA OR VOMITING 30 tablet 1  . senna-docusate (SENOKOT-S) 8.6-50 MG tablet Take 2 tablets by mouth at bedtime.    . traMADol (ULTRAM) 50 MG tablet TAKE 1 TO 2 TABLETS BY MOUTH EVERY 6 HOURS AS NEEDED. 20 tablet 0   No current  facility-administered medications for this visit.    OBJECTIVE: White Khan who appears stated age  64:   05/23/20 1511  BP: 106/85  Pulse: 91  Resp: 18  Temp: 97.7 F (36.5 C)  SpO2: 99%     Body mass index is 40.82 kg/m.   Filed Weights   05/23/20 1511  Weight: 260 lb 9.6 oz (118.2 kg)   ECOG FS:1 - Symptomatic but completely ambulatory  Sclerae unicteric, EOMs intact Wearing a mask No cervical or supraclavicular adenopathy Lungs no rales or rhonchi Heart regular rate and rhythm Abd soft, nontender, positive bowel sounds MSK no focal spinal tenderness, no upper extremity lymphedema Neuro: nonfocal, well oriented, appropriate affect Breasts: The left breast is status post mastectomy.  The wound is imaged below.  Left chest wall wound 05/23/2020     Left breast 04/18/2020:     Left breast March 2022     LAB RESULTS:  CMP     Component Value Date/Time   NA 140 05/23/2020 1455   NA 140 01/07/2017 1342   K 3.2 (L) 05/23/2020 1455   K 3.5 01/07/2017 1342   CL 98 05/23/2020 1455   CO2 30 05/23/2020 1455   CO2 27 01/07/2017 1342   GLUCOSE 124 (H) 05/23/2020 1455   GLUCOSE 148 (H) 01/07/2017 1342   BUN 11 05/23/2020 1455   BUN 6.2 (L) 01/07/2017 1342   CREATININE 0.80 05/23/2020 1455   CREATININE 0.79 12/28/2019 1322   CREATININE 0.7 01/07/2017 1342   CALCIUM 9.4 05/23/2020 1455   CALCIUM 9.4 01/07/2017 1342   PROT 7.7 05/23/2020 1455   PROT 7.0 01/07/2017 1342   ALBUMIN 3.7 05/23/2020 1455   ALBUMIN 3.7 01/07/2017 1342   AST 16 05/23/2020 1455   AST 20 12/28/2019 1322   AST 23 01/07/2017 1342   ALT 10 05/23/2020 1455   ALT 19 12/28/2019 1322   ALT 24 01/07/2017 1342   ALKPHOS 112 05/23/2020 1455   ALKPHOS 105 01/07/2017 1342   BILITOT 0.8 05/23/2020 1455   BILITOT 0.9 12/28/2019  1322   BILITOT 0.84 01/07/2017 1342   GFRNONAA >60 05/23/2020 1455   GFRNONAA >60 12/28/2019 1322   GFRAA >60 05/23/2019 1921   GFRAA >60 10/13/2018 1346     No results found for: [name], A1GS, A2GS, BETS, BETA2SER, GAMS, MSPIKE, SPEI  No results found for: Nils Pyle, [facility]  Lab Results  Component Value Date   WBC 4.3 05/23/2020   NEUTROABS 2.0 05/23/2020   HGB 11.2 (L) 05/23/2020   HCT 33.0 (L) 05/23/2020   MCV 85.9 05/23/2020   PLT 202 05/23/2020      Chemistry      Component Value Date/Time   NA 140 05/23/2020 1455   NA 140 01/07/2017 1342   K 3.2 (L) 05/23/2020 1455   K 3.5 01/07/2017 1342   CL 98 05/23/2020 1455   CO2 30 05/23/2020 1455   CO2 27 01/07/2017 1342   BUN 11 05/23/2020 1455   BUN 6.2 (L) 01/07/2017 1342   CREATININE 0.80 05/23/2020 1455   CREATININE 0.79 12/28/2019 1322   CREATININE 0.7 01/07/2017 1342      Component Value Date/Time   CALCIUM 9.4 05/23/2020 1455   CALCIUM 9.4 01/07/2017 1342   ALKPHOS 112 05/23/2020 1455   ALKPHOS 105 01/07/2017 1342   AST 16 05/23/2020 1455   AST 20 12/28/2019 1322   AST 23 01/07/2017 1342   ALT 10 05/23/2020 1455   ALT 19 12/28/2019 1322   ALT 24 01/07/2017 1342   BILITOT 0.8 05/23/2020 1455   BILITOT 0.9 12/28/2019 1322   BILITOT 0.84 01/07/2017 1342       No results found for: LABCA2  No components found for: [id number]  No results for input(s): INR in the last 168 hours.  Urinalysis    Component Value Date/Time   COLORURINE YELLOW 12/05/2019 1154   APPEARANCEUR CLOUDY (A) 12/05/2019 1154   LABSPEC 1.024 12/05/2019 1154   LABSPEC 1.025 11/23/2016 1251   PHURINE 5.0 12/05/2019 1154   GLUCOSEU NEGATIVE 12/05/2019 1154   GLUCOSEU Negative 11/23/2016 1251   HGBUR SMALL (A) 12/05/2019 1154   BILIRUBINUR NEGATIVE 12/05/2019 1154   BILIRUBINUR Negative 11/23/2016 1251   KETONESUR NEGATIVE 12/05/2019 1154   PROTEINUR NEGATIVE 12/05/2019 1154   UROBILINOGEN 0.2 11/23/2016 1251   NITRITE NEGATIVE 12/05/2019 1154   LEUKOCYTESUR NEGATIVE 12/05/2019 1154   LEUKOCYTESUR Negative 11/23/2016 1251    STUDIES: No  results found.   ELIGIBLE FOR AVAILABLE RESEARCH PROTOCOL: no   ASSESSMENT: 50 y.o. [name] status post left breast upper outer quadrant and left axillary lymph node biopsy 06/19/2016, both positive for a clinical T2-T3 N1, stage IIIB-C invasive ductal carcinoma, grade 3, triple negative, with an MIB-1 of 90%  (1) genetics testing 08/04/2016 showed a variant of uncertain significance in the BRCA2 namely c.8169T>A (p.Asp2723Glu). 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 Additional testing of the patient's mother and maternal aunt is pending. Otherwise [facility]'s Common Hereditary Cancers Panel found no deleterious mutations in APC, ATM, AXIN2, BARD1, BMPR1A, BRCA1, BRCA2, BRIP1, CDH1, CDKN2A, CHEK2, CTNNA1, DICER1, EPCAM, GREM1, HOXB13, KIT, MEN1, MLH1, MSH2, MSH3, MSH6, MUTYH, NBN, NF1, NTHL1, PALB2, PDGFRA, PMS2, POLD1, POLE, PTEN, RAD50, RAD51C, RAD51D, SDHA, SDHB, SDHC, SDHD, SMAD4, SMARCA4, STK11, TP53, TSC1, TSC2, and VHL.  (2) neoadjuvant chemotherapy consisting of doxorubicin and cyclophosphamide in dose dense fashion 4 completed 09/10/2016 followed by paclitaxel weekly 12 given with carboplatin   (a) cycle 4 of cyclophosphamide and doxorubicin was delayed 10 days and dose  decreased 10% because of febrile neutropenia after cycle 3  (b) cycle 6 of Paclitaxel and Carboplatin delayed due to neutropenia, therefore Granix added to Wednesday, Thursday, Friday following chemotherapy days  (3) status post left lumpectomy and left axillary sentinel lymph node sampling 01/27/2017 with pathology showing a complete pathologic response (ypT0 ypN0);   (a) 5 left axillary lymph nodes removed  (4) adjuvant radiation 03/11/2017-04/21/2017 Site/dose:   1. Left breast, 2 Gy in 25 fractions for a total dose of 50 Gy                      2. 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  (8) status post bilateral mastectomies 01/09/2020 showing  (a) on the right, no evidence of malignancy  (b) on the left, a pT2 pNX, stage IIB invasive ductal carcinoma, grade 3, with ample margins   (c) repeat prognostic panel on the left again weakly estrogen receptor positive, HER2 negative, progesterone receptor negative  (9) adjuvant therapy with cyclophosphamide, methotrexate, fluorouracil (CMF) given every 21 days x 8, started 02/19/2020  (a) cycle 2 and subsequent cycles held due to surgical wound dehiscence  (10) olaparib to start 06/24/2020 assuming the patient is clinically stable  (11) foundation 1 requested on 01/09/2020 material found in addition to the BRCA2 mutation, a PTEN loss of exon 1 and RB1R 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  ERV B2 and PI K3 CA were negative, and the microsatellite status was stable, with low mutational burden (4/MB)  (12) anastrozole started 04/18/2020   (13) hyperbaric treatments started 04/16/2020, 8 weeks planned   PLAN: Briah is just about 6 weeks out from her bilateral mastectomies.  She continues to work at resolution of the wound dehiscence in an area that is severely devascularized because of her prior radiation treatments.  She appears to be making progress through the hyperbaric treatments and she tells me she has 14 treatments to go,  which is 3 more weeks.  Dr. Asa Saunas is concerned that what we are seeing at the base of the wound may be not granulation tissue but tumor.  A punch biopsy was suggested but [name] feels confident that this is granulation tissue and does not feel a punch biopsy is needed.  Kellin was very concerned about this.  I am setting her up for an MRI of the chest which is not going to give [place] of course any pathologic detail but I think should be reassuring to her and also will tell us that we are not seeing metastatic disease elsewhere.  Hopefully we can get this approved and done within the next 2 weeks.  At this point she is too far out from her surgery for me to have any confidence chemotherapy would be helpful.  We are going to go with olaparib instead.  We discussed the possible toxicities side effects and  complications and she is very aware of them.  She is agreeable to giving it a try starting 06/24/2020.  She knows to contact us if any problem develops.  I will catch up with her when she returns from their planned [facility] trip in July  Total encounter time 35 minutes.*   Zayvier Caravello, [name], Sarah  05/23/20 5:46 PM Medical Oncology and Hematology [facility] [place], [place] [address] Tel. [phone]    Fax. [phone]   I, Wilburn Mylar, am acting as scribe for Dr. Virgie Khan. Panayiota Larkin.  I, Lurline Del Sarah, have reviewed the above documentation for accuracy and completeness, and I agree with the above.   *Total Encounter Time as defined by the Centers for Medicare and Medicaid Services includes, in addition to the face-to-face time of a patient visit (documented in the note above) non-face-to-face time: obtaining and reviewing outside history, ordering and reviewing medications, tests or procedures, care coordination (communications with other health care professionals or caregivers) and documentation in the medical record.

## 2020-05-22 NOTE — Progress Notes (Signed)
COLA, GANE ([id number]) Visit Report for 05/16/2020 SuperBill Details Patient Name: Date of Service: [name] [name] 05/16/2020 Medical Record Number: [id number] Patient Account Number: [id number] Date of Birth/Sex: Treating RN: [birth year] (50 y.o. 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 Disruption of external operation (surgical) wound, not elsewhere classified, subsequent encounter 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 Facility Procedures CPT4 Code Description Modifier Quantity [id number]-(Facility Use Only) HBOT full body chamber, 2min , 4 Physician Procedures Quantity CPT4 Code Description Modifier [id number] - WC PHYS HYPERBARIC OXYGEN THERAPY 1 ICD-10 Diagnosis Description 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 Electronic Signature(s) Signed: 05/16/2020 3:44:17 PM By: [name] MD Signed: 05/22/2020 6:30:22 PM By: Levan Hurst RN, BSN Entered By: Levan Hurst on 05/16/2020 14:18:08

## 2020-05-22 NOTE — Progress Notes (Signed)
DIAMONDS, LIPPARD ([id number]) Visit Report for 05/21/2020 HBO Details Patient Name: Date of Service: [name] 05/21/2020 8:00 A M Medical Record Number: [id number] Patient Account Number: [id number] Date of Birth/Sex: Treating RN: 1970/01/24 (50 y.o. 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 HBO Treatment Details Treatment Number: 25 Patient Type: Outpatient Chamber Type: Monoplace Chamber Serial #: [id number] Treatment Protocol: 2.5 ATA with 90 minutes oxygen, with two 5 minute air breaks Treatment Details Compression Rate Down: 2.0 psi / minute De-Compression Rate Up: 2.0 psi / minute A breaks and breathing ir Compress Tx Pressure periods Decompress Decompress Begins Reached (leave unused spaces Begins Ends blank) Chamber Pressure (ATA 1 2.5 2.5 2.5 2.5 2.5 - - 2.5 1 ) Clock Time (24 hr) 08:56 09:08 09:38 09:43 10:13 10:18 - - 10:48 11:00 Treatment Length: 124 (minutes) Treatment Segments: 4 Vital Signs Capillary Blood Glucose Reference Range: 80 - 120 mg / dl HBO Diabetic Blood Glucose Intervention Range: <131 mg/dl or >249 mg/dl Time Vitals Blood Respiratory Capillary Blood Glucose Pulse Action Type: Pulse: Temperature: Taken: Pressure: Rate: Glucose (mg/dl): Meter #: Oximetry (%) Taken: Pre 08:45 124/85 88 16 98.4 Post 11:00 129/85 83 18 98.1 Treatment Response Treatment Completion Status: Treatment Completed without Adverse Event Makinzey Banes Notes No concerns with treatment given. Patient was also seen for wound care evaluation Physician HBO Attestation: I certify that I supervised this HBO treatment in  accordance with Medicare guidelines. A trained emergency response team is readily available per Yes hospital policies and procedures. Continue HBOT as ordered. Yes Electronic Signature(s) Signed: 05/21/2020 4:16:43 PM By: [name] MD Entered By: [name] on 05/21/2020 16:14:22 -------------------------------------------------------------------------------- HBO Safety Checklist Details Patient Name: Date of Service: [name], [name] 05/21/2020 8:00 A M Medical Record Number: [id number] Patient Account Number: [id number] Date of Birth/Sex: Treating RN: 01-Nov-1970 (50 y.o. [name] Primary Care Valborg Friar: [name] Other Clinician: Referring Audria Takeshita: Treating Sanja Elizardo/Extender: [name] in Treatment: 6 HBO Safety Checklist Items Safety Checklist Consent Form Signed Patient voided / foley secured and emptied When did you last eato 0700 Last dose of injectable or oral agent na Ostomy pouch emptied and vented if applicable NA All implantable devices assessed, documented and approved Intravenous access site secured and place NA Valuables secured Linens and cotton and cotton/polyester blend (less than 51% polyester) Personal oil-based products / skin lotions / body lotions removed Wigs or hairpieces removed Smoking or tobacco materials removed Books / newspapers / magazines / loose paper removed Cologne, aftershave, perfume and deodorant removed Jewelry removed (may wrap wedding band) Make-up removed Hair care products removed Battery operated devices (external) removed Heating patches and chemical warmers removed Titanium eyewear removed Nail polish cured greater than 10 hours Casting material cured greater than 10 hours NA Hearing aids removed NA Loose dentures or partials removed Prosthetics have been removed NA Patient demonstrates correct use of air break device (if applicable) Patient concerns have  been addressed Patient grounding bracelet on and cord attached to chamber Specifics for Inpatients (complete in addition to above) Medication sheet sent with patient NA Intravenous medications needed or due during therapy sent with patient NA Drainage tubes (e.g. nasogastric tube or  chest tube secured and vented) NA Endotracheal or Tracheotomy tube secured NA Cuff deflated of air and inflated with saline NA Airway suctioned NA Electronic Signature(s) Signed: 05/22/2020 6:30:22 PM By: Levan Hurst RN, BSN Entered By: Levan Hurst on 05/21/2020 09:55:20

## 2020-05-22 NOTE — Progress Notes (Signed)
[name], [name] ([id number]) Visit Report for 05/17/2020 Arrival Information Details Patient Name: Date of Service: [name] 05/17/2020 8:00 A M Medical Record Number: [id number] Patient Account Number: [id number] Date of Birth/Sex: Treating RN: 1970/01/30 (50 y.o. [name], [name] Primary Care [name]: [name] Other Clinician: Referring Saria Haran: Treating [name]/Extender: [name] in Treatment: 5 Visit Information History Since Last Visit Added or deleted any medications: No Patient Arrived: Ambulatory Any new allergies or adverse reactions: No Arrival Time: 07:40 Had a fall or experienced change in No Accompanied By: alone activities of daily living that may affect Transfer Assistance: None risk of falls: Patient Identification Verified: Yes Signs or symptoms of abuse/neglect since last visito No Secondary Verification Process Completed: Yes Hospitalized since last visit: No Patient Requires Transmission-Based Precautions: No Implantable device outside of the clinic excluding No Patient Has Alerts: No cellular tissue based products placed in the center since last visit: Has Dressing in Place as Prescribed: Yes Pain Present Now: No Electronic Signature(s) Signed: 05/22/2020 6:30:22 PM By: Sarah Hurst RN, BSN Entered By: [name] on 05/17/2020 10:54:59 -------------------------------------------------------------------------------- Encounter Discharge Information Details Patient Name: Date of Service: [name] V [name] 05/17/2020 8:00 A M Medical Record Number: [id number] Patient Account Number: [id number] Date of Birth/Sex: Treating RN: 05-Dec-1970 (50 y.o. [name] Primary Care [name]: [name] Other Clinician: Referring Leni Pankonin: Treating [name]/Extender: [name] in Treatment: 5 Encounter Discharge Information Items Discharge Condition:  Stable Ambulatory Status: Ambulatory Discharge Destination: Home Transportation: Private Auto Accompanied By: alone Schedule Follow-up Appointment: Yes Clinical Summary of Care: Patient Declined Electronic Signature(s) Signed: 05/22/2020 6:30:22 PM By: Sarah Hurst RN, BSN Entered By: [name] on 05/17/2020 10:58:12 -------------------------------------------------------------------------------- Vitals Details Patient Name: Date of Service: [name], [name] V [name] 05/17/2020 8:00 A M Medical Record Number: [id number] Patient Account Number: [id number] Date of Birth/Sex: Treating RN: [birth year] (50 y.o. [name] Primary Care [name]: [name] Other Clinician: Referring [name]: Treating [name]/Extender: [name] in Treatment: 5 Vital Signs Time Taken: 07:40 Temperature (F): 98.1 Height (in): 67 Pulse (bpm): 98 Weight (lbs): 257 Respiratory Rate (breaths/min): 18 Body Mass Index (BMI): 40.2 Blood Pressure (mmHg): 123/64 Reference Range: 80 - 120 mg / dl Electronic Signature(s) Signed: 05/22/2020 6:30:22 PM By: Sarah Hurst RN, BSN Entered By: [name] on 05/17/2020 10:55:20

## 2020-05-22 NOTE — Progress Notes (Signed)
JAEDIN, TRUMBO ([id number]) Visit Report for 05/21/2020 SuperBill Details Patient Name: Date of Service: [name] 05/21/2020 Medical Record Number: [id number] Patient Account Number: [id number] Date of Birth/Sex: Treating RN: 07-Jan-1970 (50 y.o. 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 Disruption of external operation (surgical) wound, not elsewhere classified, subsequent encounter 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 Facility Procedures CPT4 Code Description Modifier Quantity [id number] G0277-(Facility Use Only) HBOT full body chamber, 71min , 4 Physician Procedures Quantity CPT4 Code Description Modifier [id number] 29476 - WC PHYS HYPERBARIC OXYGEN THERAPY 1 ICD-10 Diagnosis Description 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

## 2020-05-22 NOTE — Progress Notes (Signed)
LILLYANNA, GLANDON ([id number]) Visit Report for 05/16/2020 Arrival Information Details Patient Name: Date of Service: [name] 05/16/2020 8:00 A M Medical Record Number: [id number] Patient Account Number: [id number] Date of Birth/Sex: Treating RN: 1970/09/13 (50 y.o. [name], [name] Primary Care Valerio Pinard: [name] Other Clinician: Referring Brookelle Pellicane: Treating Konner Warrior/Extender: [name] in Treatment: 5 Visit Information History Since Last Visit Added or deleted any medications: No Patient Arrived: Ambulatory Any new allergies or adverse reactions: No Arrival Time: 07:40 Had a fall or experienced change in No Accompanied By: alone activities of daily living that may affect Transfer Assistance: None risk of falls: Patient Identification Verified: Yes Signs or symptoms of abuse/neglect since last visito No Secondary Verification Process Completed: Yes Hospitalized since last visit: No Patient Requires Transmission-Based Precautions: No Implantable device outside of the clinic excluding No Patient Has Alerts: No cellular tissue based products placed in the center since last visit: Has Dressing in Place as Prescribed: Yes Pain Present Now: No Electronic Signature(s) Signed: 05/22/2020 6:30:22 PM By: Levan Hurst RN, BSN Entered By: Levan Hurst on 05/16/2020 08:30:52 -------------------------------------------------------------------------------- Encounter Discharge Information Details Patient Name: Date of Service: [name] 05/16/2020 8:00 A M Medical Record Number: [id number] Patient Account Number: [id number] Date of Birth/Sex: Treating RN: 03/12/1970 (50 y.o. [name] Primary Care Maureen Delatte: [name] Other Clinician: Referring Destaney Sarkis: Treating Damany Eastman/Extender: [name] in Treatment: 5 Encounter Discharge Information Items Discharge Condition:  Stable Ambulatory Status: Ambulatory Discharge Destination: Home Transportation: Private Auto Accompanied By: alone Schedule Follow-up Appointment: Yes Clinical Summary of Care: Patient Declined Electronic Signature(s) Signed: 05/22/2020 6:30:22 PM By: Levan Hurst RN, BSN Entered By: Levan Hurst on 05/16/2020 14:18:27 -------------------------------------------------------------------------------- Vitals Details Patient Name: Date of Service: [name] 05/16/2020 8:00 A M Medical Record Number: [id number] Patient Account Number: [id number] Date of Birth/Sex: Treating RN: [birth year] (50 y.o. [name] Primary Care Vallerie Hentz: [name] Other Clinician: Referring Ulice Follett: Treating Jhostin Epps/Extender: [name] in Treatment: 5 Vital Signs Time Taken: 07:40 Temperature (F): 98.0 Height (in): 67 Pulse (bpm): 98 Weight (lbs): 257 Respiratory Rate (breaths/min): 20 Body Mass Index (BMI): 40.2 Blood Pressure (mmHg): 152/97 Reference Range: 80 - 120 mg / dl Electronic Signature(s) Signed: 05/22/2020 6:30:22 PM By: Levan Hurst RN, BSN Entered By: Levan Hurst on 05/16/2020 08:31:23

## 2020-05-23 ENCOUNTER — Inpatient Hospital Stay: Payer: Medicaid Other

## 2020-05-23 ENCOUNTER — Encounter (HOSPITAL_BASED_OUTPATIENT_CLINIC_OR_DEPARTMENT_OTHER): Payer: Medicaid Other | Admitting: Internal Medicine

## 2020-05-23 ENCOUNTER — Other Ambulatory Visit (HOSPITAL_COMMUNITY): Payer: Self-pay

## 2020-05-23 ENCOUNTER — Other Ambulatory Visit: Payer: Self-pay

## 2020-05-23 ENCOUNTER — Inpatient Hospital Stay: Payer: Medicaid Other | Attending: Oncology | Admitting: Oncology

## 2020-05-23 VITALS — BP 106/85 | HR 91 | Temp 97.7°F | Resp 18 | Ht 67.0 in | Wt 260.6 lb

## 2020-05-23 DIAGNOSIS — C50412 Malignant neoplasm of upper-outer quadrant of left female breast: Secondary | ICD-10-CM | POA: Diagnosis not present

## 2020-05-23 DIAGNOSIS — C50912 Malignant neoplasm of unspecified site of left female breast: Secondary | ICD-10-CM

## 2020-05-23 DIAGNOSIS — T8131XS Disruption of external operation (surgical) wound, not elsewhere classified, sequela: Secondary | ICD-10-CM | POA: Insufficient documentation

## 2020-05-23 DIAGNOSIS — Z95828 Presence of other vascular implants and grafts: Secondary | ICD-10-CM

## 2020-05-23 DIAGNOSIS — Z79811 Long term (current) use of aromatase inhibitors: Secondary | ICD-10-CM | POA: Insufficient documentation

## 2020-05-23 DIAGNOSIS — Z1502 Genetic susceptibility to malignant neoplasm of ovary: Secondary | ICD-10-CM

## 2020-05-23 DIAGNOSIS — Z148 Genetic carrier of other disease: Secondary | ICD-10-CM | POA: Insufficient documentation

## 2020-05-23 DIAGNOSIS — Z1509 Genetic susceptibility to other malignant neoplasm: Secondary | ICD-10-CM

## 2020-05-23 DIAGNOSIS — K429 Umbilical hernia without obstruction or gangrene: Secondary | ICD-10-CM

## 2020-05-23 DIAGNOSIS — Z171 Estrogen receptor negative status [ER-]: Secondary | ICD-10-CM

## 2020-05-23 DIAGNOSIS — Z1501 Genetic susceptibility to malignant neoplasm of breast: Secondary | ICD-10-CM

## 2020-05-23 DIAGNOSIS — Z6841 Body Mass Index (BMI) 40.0 and over, adult: Secondary | ICD-10-CM

## 2020-05-23 DIAGNOSIS — Z923 Personal history of irradiation: Secondary | ICD-10-CM | POA: Insufficient documentation

## 2020-05-23 DIAGNOSIS — Z9013 Acquired absence of bilateral breasts and nipples: Secondary | ICD-10-CM

## 2020-05-23 DIAGNOSIS — Z9221 Personal history of antineoplastic chemotherapy: Secondary | ICD-10-CM | POA: Insufficient documentation

## 2020-05-23 LAB — CBC WITH DIFFERENTIAL/PLATELET
Abs Immature Granulocytes: 0.01 10*3/uL (ref 0.00–0.07)
Basophils Absolute: 0 10*3/uL (ref 0.0–0.1)
Basophils Relative: 1 %
Eosinophils Absolute: 0.1 10*3/uL (ref 0.0–0.5)
Eosinophils Relative: 3 %
HCT: 33 % — ABNORMAL LOW (ref 36.0–46.0)
Hemoglobin: 11.2 g/dL — ABNORMAL LOW (ref 12.0–15.0)
Immature Granulocytes: 0 %
Lymphocytes Relative: 40 %
Lymphs Abs: 1.7 10*3/uL (ref 0.7–4.0)
MCH: 29.2 pg (ref 26.0–34.0)
MCHC: 33.9 g/dL (ref 30.0–36.0)
MCV: 85.9 fL (ref 80.0–100.0)
Monocytes Absolute: 0.4 10*3/uL (ref 0.1–1.0)
Monocytes Relative: 9 %
Neutro Abs: 2 10*3/uL (ref 1.7–7.7)
Neutrophils Relative %: 47 %
Platelets: 202 10*3/uL (ref 150–400)
RBC: 3.84 MIL/uL — ABNORMAL LOW (ref 3.87–5.11)
RDW: 14.6 % (ref 11.5–15.5)
WBC: 4.3 10*3/uL (ref 4.0–10.5)
nRBC: 0 % (ref 0.0–0.2)

## 2020-05-23 LAB — COMPREHENSIVE METABOLIC PANEL
ALT: 10 U/L (ref 0–44)
AST: 16 U/L (ref 15–41)
Albumin: 3.7 g/dL (ref 3.5–5.0)
Alkaline Phosphatase: 112 U/L (ref 38–126)
Anion gap: 12 (ref 5–15)
BUN: 11 mg/dL (ref 6–20)
CO2: 30 mmol/L (ref 22–32)
Calcium: 9.4 mg/dL (ref 8.9–10.3)
Chloride: 98 mmol/L (ref 98–111)
Creatinine, Ser: 0.8 mg/dL (ref 0.44–1.00)
GFR, Estimated: >60 mL/min (ref >60)
Glucose, Bld: 124 mg/dL — ABNORMAL HIGH (ref 70–99)
Potassium: 3.2 mmol/L — ABNORMAL LOW (ref 3.5–5.1)
Sodium: 140 mmol/L (ref 135–145)
Total Bilirubin: 0.8 mg/dL (ref 0.3–1.2)
Total Protein: 7.7 g/dL (ref 6.5–8.1)

## 2020-05-23 MED ORDER — TRAMADOL HCL 50 MG PO TABS
ORAL_TABLET | Freq: Four times a day (QID) | ORAL | 0 refills | Status: DC | PRN
  Filled 2020-05-23: qty 20, 3d supply, fill #0

## 2020-05-23 MED ORDER — HEPARIN SOD (PORK) LOCK FLUSH 100 UNIT/ML IV SOLN
500.0000 [IU] | Freq: Once | INTRAVENOUS | Status: AC
  Administered 2020-05-23: 500 [IU]
  Filled 2020-05-23: qty 5

## 2020-05-23 MED ORDER — SODIUM CHLORIDE 0.9% FLUSH
10.0000 mL | Freq: Once | INTRAVENOUS | Status: AC
  Administered 2020-05-23: 10 mL
  Filled 2020-05-23: qty 10

## 2020-05-23 NOTE — Progress Notes (Signed)
[name], [name] ([id number]) Visit Report for 05/22/2020 SuperBill Details Patient Name: Date of Service: [name] 05/22/2020 Medical Record Number: [id number] Patient Account Number: [id number] Date of Birth/Sex: Treating RN: July 11, 1970 (50 y.o. 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 Disruption of external operation (surgical) wound, not elsewhere classified, subsequent encounter 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 Facility Procedures CPT4 Code Description Modifier Quantity [id number] G0277-(Facility Use Only) HBOT full body chamber, 23min , 4 Physician Procedures Quantity CPT4 Code Description Modifier [id number] 79038 - WC PHYS HYPERBARIC OXYGEN THERAPY 1 ICD-10 Diagnosis Description 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

## 2020-05-23 NOTE — Addendum Note (Signed)
Addended by: Chauncey Cruel on: 05/23/2020 05:48 PM   Modules accepted: Orders

## 2020-05-23 NOTE — Progress Notes (Signed)
[name], [name] ([id number]) Visit Report for 05/22/2020 Arrival Information Details Patient Name: Date of Service: A Sarah Romberg [name] 05/22/2020 8:00 A M Medical Record Number: [id number] Patient Account Number: [id number] Date of Birth/Sex: Treating RN: February 05, 1970 (50 y.o. [name], [name] Primary Care Natashia Roseman: [name] Other Clinician: Referring Bentlee Drier: Treating Shivon Hackel/Extender: Alverda Skeans in Treatment: 6 Visit Information History Since Last Visit Added or deleted any medications: No Patient Arrived: Ambulatory Any new allergies or adverse reactions: No Arrival Time: 07:50 Had a fall or experienced change in No Accompanied By: self activities of daily living that may affect Transfer Assistance: None risk of falls: Patient Identification Verified: Yes Signs or symptoms of abuse/neglect since last visito No Secondary Verification Process Completed: Yes Hospitalized since last visit: No Patient Requires Transmission-Based Precautions: No Implantable device outside of the clinic excluding No Patient Has Alerts: No cellular tissue based products placed in the center since last visit: Has Dressing in Place as Prescribed: Yes Pain Present Now: No Electronic Signature(s) Signed: 05/23/2020 6:00:49 PM By: Deon Pilling Entered By: Deon Pilling on 05/22/2020 10:42:59 -------------------------------------------------------------------------------- Encounter Discharge Information Details Patient Name: Date of Service: Sarah Crochet V [name] 05/22/2020 8:00 A M Medical Record Number: [id number] Patient Account Number: [id number] Date of Birth/Sex: Treating RN: Apr 16, 1970 (50 y.o. [name] Primary Care Jerrett Baldinger: [name] Other Clinician: Referring Markevion Lattin: Treating Derry Kassel/Extender: Alverda Skeans in Treatment: 6 Encounter Discharge Information Items Discharge Condition: Stable Ambulatory Status: Ambulatory Discharge  Destination: Home Transportation: Private Auto Accompanied By: self Schedule Follow-up Appointment: Yes Clinical Summary of Care: Electronic Signature(s) Signed: 05/23/2020 6:00:49 PM By: Deon Pilling Entered By: Deon Pilling on 05/22/2020 10:45:02 -------------------------------------------------------------------------------- Vitals Details Patient Name: Date of Service: Sarah Crochet V [name] 05/22/2020 8:00 A M Medical Record Number: [id number] Patient Account Number: [id number] Date of Birth/Sex: Treating RN: Jan 19, 1970 (50 y.o. [name], [name] Primary Care [name] [name]: [name] Other Clinician: Referring Bladen Umar: Treating Mayela Bullard/Extender: Alverda Skeans in Treatment: 6 Vital Signs Time Taken: 07:50 Temperature (F): 97.8 Height (in): 67 Pulse (bpm): 104 Weight (lbs): 257 Respiratory Rate (breaths/min): 16 Body Mass Index (BMI): 40.2 Blood Pressure (mmHg): 136/72 Reference Range: 80 - 120 mg / dl Electronic Signature(s) Signed: 05/23/2020 6:00:49 PM By: Deon Pilling Entered By: Deon Pilling on 05/22/2020 10:43:12

## 2020-05-23 NOTE — Progress Notes (Signed)
[name], MARCHESE ([id number]) Visit Report for 05/22/2020 HBO Details Patient Name: Date of Service: [name] 05/22/2020 8:00 A M Medical Record Number: [id number] Patient Account Number: [id number] Date of Birth/Sex: Treating RN: 1970-09-11 (50 y.o. 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 HBO Treatment Details Treatment Number: 26 Patient Type: Outpatient Chamber Type: Monoplace Chamber Serial #: [id number] Treatment Protocol: 2.5 ATA with 90 minutes oxygen, with two 5 minute air breaks Treatment Details Compression Rate Down: 2.0 psi / minute De-Compression Rate Up: 2.0 psi / minute A breaks and breathing ir Compress Tx Pressure periods Decompress Decompress Begins Reached (leave unused spaces Begins Ends blank) Chamber Pressure (ATA 1 2.5 2.5 2.5 2.5 2.5 - - 2.5 1 ) Clock Time (24 hr) 08:08 08:20 08:50 08:55 09:25 09:30 - - 10:00 10:09 Treatment Length: 121 (minutes) Treatment Segments: 4 Vital Signs Capillary Blood Glucose Reference Range: 80 - 120 mg / dl HBO Diabetic Blood Glucose Intervention Range: <131 mg/dl or >249 mg/dl Time Vitals Blood Respiratory Capillary Blood Glucose Pulse Action Type: Pulse: Temperature: Taken: Pressure: Rate: Glucose (mg/dl): Meter #: Oximetry (%) Taken: Pre 07:50 136/72 104 16 97.8 Post 10:11 110/83 85 16 98 Treatment Response Treatment Toleration: Well Treatment Completion Status: Treatment Completed without Adverse Event Electronic Signature(s) Signed: 05/22/2020 1:22:22 PM By: Worthy Keeler PA-C Signed: 05/23/2020 6:00:49 PM By: Deon Pilling Entered By: Deon Pilling  on 05/22/2020 10:44:32 -------------------------------------------------------------------------------- HBO Safety Checklist Details Patient Name: Date of Service: [name] V [name] 05/22/2020 8:00 A M Medical Record Number: [id number] Patient Account Number: [id number] Date of Birth/Sex: Treating RN: 09/14/1970 (50 y.o. [name], [name] Primary Care Abbygail Willhoite: [name] Other Clinician: Referring Eudelia Hiltunen: Treating Journey Ratterman/Extender: Alverda Skeans in Treatment: 6 HBO Safety Checklist Items Safety Checklist Consent Form Signed Patient voided / foley secured and emptied When did you last eato this morning Last dose of injectable or oral agent n/a Ostomy pouch emptied and vented if applicable NA All implantable devices assessed, documented and approved NA Intravenous access site secured and place NA Valuables secured Linens and cotton and cotton/polyester blend (less than 51% polyester) Personal oil-based products / skin lotions / body lotions removed NA Wigs or hairpieces removed NA Smoking or tobacco materials removed NA Books / newspapers / magazines / loose paper removed NA Cologne, aftershave, perfume and deodorant removed NA Jewelry removed (may wrap wedding band) Make-up removed NA Hair care products removed NA Battery operated devices (external) removed NA Heating patches and chemical warmers removed NA Titanium eyewear removed Nail polish cured greater than 10 hours Casting material cured greater than 10 hours NA Hearing aids removed NA Loose dentures or partials removed Prosthetics have been removed NA Patient demonstrates correct use of air break device (if applicable) Patient concerns have been addressed Patient grounding bracelet on and cord attached to chamber Specifics for Inpatients (complete in addition to above) Medication sheet sent with patient Intravenous medications needed or due during therapy sent with  patient Drainage tubes (e.g. nasogastric tube or chest tube secured and vented) Endotracheal or Tracheotomy tube secured Cuff deflated of air and inflated with saline Airway suctioned Electronic Signature(s) Signed: 05/23/2020 6:00:49 PM By: Deon Pilling Entered By:  Deon Pilling on 05/22/2020 10:43:41

## 2020-05-24 ENCOUNTER — Telehealth: Payer: Self-pay | Admitting: Oncology

## 2020-05-24 ENCOUNTER — Encounter (HOSPITAL_BASED_OUTPATIENT_CLINIC_OR_DEPARTMENT_OTHER): Payer: Medicaid Other | Admitting: Internal Medicine

## 2020-05-24 ENCOUNTER — Other Ambulatory Visit (HOSPITAL_COMMUNITY): Payer: Self-pay

## 2020-05-24 DIAGNOSIS — C50412 Malignant neoplasm of upper-outer quadrant of left female breast: Secondary | ICD-10-CM | POA: Diagnosis not present

## 2020-05-24 LAB — CANCER ANTIGEN 27.29: CA 27.29: 24.6 U/mL (ref 0.0–38.6)

## 2020-05-24 NOTE — Telephone Encounter (Signed)
Scheduled appointment per 05/19 los. Patient is aware. 

## 2020-05-25 ENCOUNTER — Other Ambulatory Visit (HOSPITAL_COMMUNITY): Payer: Self-pay

## 2020-05-27 ENCOUNTER — Other Ambulatory Visit: Payer: Self-pay

## 2020-05-27 ENCOUNTER — Ambulatory Visit (INDEPENDENT_AMBULATORY_CARE_PROVIDER_SITE_OTHER): Payer: Medicaid Other | Admitting: Plastic Surgery

## 2020-05-27 ENCOUNTER — Encounter (HOSPITAL_BASED_OUTPATIENT_CLINIC_OR_DEPARTMENT_OTHER): Payer: Medicaid Other | Admitting: Internal Medicine

## 2020-05-27 ENCOUNTER — Encounter: Payer: Self-pay | Admitting: Plastic Surgery

## 2020-05-27 VITALS — BP 134/82 | HR 94 | Ht 67.0 in | Wt 264.0 lb

## 2020-05-27 DIAGNOSIS — T8131XD Disruption of external operation (surgical) wound, not elsewhere classified, subsequent encounter: Secondary | ICD-10-CM

## 2020-05-27 DIAGNOSIS — S21102A Unspecified open wound of left front wall of thorax without penetration into thoracic cavity, initial encounter: Secondary | ICD-10-CM | POA: Diagnosis not present

## 2020-05-27 DIAGNOSIS — L59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: Secondary | ICD-10-CM

## 2020-05-27 DIAGNOSIS — C50412 Malignant neoplasm of upper-outer quadrant of left female breast: Secondary | ICD-10-CM | POA: Diagnosis not present

## 2020-05-27 NOTE — Progress Notes (Signed)
[name], [name] ([id number]) Visit Report for 05/24/2020 SuperBill Details Patient Name: Date of Service: [name] 05/24/2020 Medical Record Number: [id number] Patient Account Number: [id number] Date of Birth/Sex: Treating RN: [birth year] (50 y.o. 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 Disruption of external operation (surgical) wound, not elsewhere classified, subsequent encounter 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 Facility Procedures CPT4 Code Description Modifier Quantity [id number] G0277-(Facility Use Only) HBOT full body chamber, 24min , 4 Physician Procedures Quantity CPT4 Code Description Modifier [id number] 10932 - WC PHYS HYPERBARIC OXYGEN THERAPY 1 ICD-10 Diagnosis Description 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

## 2020-05-27 NOTE — Progress Notes (Signed)
Referring Provider [name], MD [place],  [place] [address]   CC:  Chief Complaint  Patient presents with  . consult      [name] is an 50 y.o. female.  HPI: Patient presents to discuss her chronic left chest wound.  She was initially diagnosed with breast cancer and underwent neoadjuvant chemotherapy followed by breast conservation therapy with lumpectomy and radiation on the left.  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  The plan was to do adjuvant chemotherapy after that which has been on hold as she is developed a wound on that side that has been difficult to heal.  She did have an additional debridement and reclosure of the surgical incision on the left but sounds like there was some skin necrosis which resulted in the wound that she currently has.  She has been doing hyperbaric treatments at the wound center for about the past month or so and feels like she has noticed some improvement.  She is far enough out from her initial surgery that her adjuvant chemotherapy plan has changed but she is here to discuss any potential surgical treatments for wound closure that might be beneficial for her.  She is notably distraught by the length of time that it is taking for this wound to heal.  Allergies  Allergen Reactions  . Carboplatin Other (See Comments)    Red, blotchy, hot    Outpatient Encounter Medications as of 05/27/2020  Medication Sig Note  . anastrozole (ARIMIDEX) 1 MG tablet TAKE 1 TABLET BY MOUTH DAILY. 03/25/2020: New medication patient has not started  . gabapentin (NEURONTIN) 100 MG capsule TAKE 2 CAPSULES BY MOUTH IN THE MORNING AND 2 CAPSULES IN THE AFTERNOON   . gabapentin (NEURONTIN) 300 MG capsule TAKE 2 CAPSULES BY MOUTH EVERY NIGHT AT BEDTIME   . lidocaine-prilocaine (EMLA) cream APPLY TO AFFECTED AREA ONCE AS DIRECTED  (Patient taking differently: Apply 1 application topically See admin instructions. Apply to affected area prior to port access.)   . LORazepam (ATIVAN) 1 MG tablet Take 1 tablet by mouth once a day as needed   . potassium chloride SA (KLOR-CON) 20 MEQ tablet TAKE 1 TABLET BY MOUTH ONCE A DAY   . prochlorperazine (COMPAZINE) 10 MG tablet TAKE 1 TABLET BY MOUTH EVERY 6 HOURS AS NEEDED FOR NAUSEA OR VOMITING 02/01/2020: Haven't started med  . senna-docusate (SENOKOT-S) 8.6-50 MG tablet Take 2 tablets by mouth at bedtime.   . traMADol (ULTRAM) 50 MG tablet TAKE 1 TO 2 TABLETS BY MOUTH EVERY 6 HOURS AS NEEDED.   Marland Kitchen HYDROcodone-acetaminophen (NORCO/VICODIN) 5-325 MG tablet TAKE 1/2-1 TABLET BY MOUTH EVERY 4 HOURS AS NEEDED FOR PAIN (Patient not taking: Reported on 05/27/2020)   . ibuprofen (ADVIL) 800 MG tablet TAKE 1 TABLET BY MOUTH 3 TIMES DAILY AS NEEDED (Patient not taking: Reported on 05/27/2020)   . olaparib (LYNPARZA) 150 MG tablet Take 2 tablets (300 mg total) by mouth 2 (two) times daily. Swallow whole. May take with food to decrease nausea and vomiting.Start May 23, 2020 (Patient not taking: Reported on 05/27/2020)   . [DISCONTINUED] venlafaxine XR (EFFEXOR-XR) 75 MG 24 hr capsule Take 1 capsule (75 mg total) by mouth daily with breakfast. (Patient not taking: Reported on 03/25/2020)    No facility-administered encounter medications on file as of 05/27/2020.     Past  Medical History:  Diagnosis Date  . Arthritis   . Breast cancer ([facility])    Left Breast Cancer  . Breast cancer ([facility]) 11/24/2019   left   . Complication of anesthesia    DAY SURGERY 2010 OR 2011 ASPIRATED AND STAYED OVERNIGHT  . History of radiation therapy 03/11/17- 04/21/17   Left breast, 2 Gy in 25 fractions for a total dose of 50 Gy, Boost, 2 Gy in 5 fractions for a total dose of 10 Gy  . Hypertension    OFF MEDS  SINCE CHEMO X 5 MONTHS  . Malignant neoplasm of upper-outer quadrant of left breast in female, estrogen receptor  negative ([facility]) 06/23/2016  . Personal history of chemotherapy 2019   Left Breast Cancer  07-2016-12-2016  . Personal history of radiation therapy 2019   Left Breast Cancer    Past Surgical History:  Procedure Laterality Date  . ANKLE SURGERY    . BREAST LUMPECTOMY Left 01/27/2017  . BREAST LUMPECTOMY WITH RADIOACTIVE SEED AND SENTINEL LYMPH NODE BIOPSY Left 01/27/2017   Procedure: LEFT BREAST LUMPECTOMY WITH RADIOACTIVE SEED LOCALIZATION, LEFT AXILLARY DEEP LYMPH NODE BIOPSY WITH RADIOACTIVE SEED LOCALIZATION, LEFT AXILLARY SENTINEL LYMPH NODE MAPPING AND BIOPSY WITH BLUE DYE INJECTION;  Surgeon: [name], MD;  Location: [facility];  Service: General;  Laterality: Left;  . COLONOSCOPY WITH PROPOFOL N/A 02/01/2020   Procedure: COLONOSCOPY WITH PROPOFOL;  Surgeon: Milus Banister, MD;  Location: [facility];  Service: Endoscopy;  Laterality: N/A;  . DEBRIDEMENT AND CLOSURE WOUND Left 03/28/2020   Procedure: DEBRIDEMENT AND CLOSURE OF MASTECTOMY WOUND;  Surgeon: Rolm Bookbinder, MD;  Location: [facility];  Service: General;  Laterality: Left;  . ENDOSCOPIC MUCOSAL RESECTION  02/01/2020   Procedure: ENDOSCOPIC MUCOSAL RESECTION;  Surgeon: Milus Banister, MD;  Location: [facility];  Service: Endoscopy;;  . HEMOSTASIS CLIP PLACEMENT  02/01/2020   Procedure: HEMOSTASIS CLIP PLACEMENT;  Surgeon: Milus Banister, MD;  Location: [facility];  Service: Endoscopy;;  . IR FLUORO GUIDE PORT INSERTION RIGHT  07/14/2016  . IR US GUIDE VASC ACCESS RIGHT  07/14/2016  . MASTECTOMY W/ SENTINEL NODE BIOPSY Bilateral 01/09/2020   Procedure: BILATERAL MASTECTOMY WITH LEFT AXILLARY SENTINEL LYMPH NODE BIOPSY;  Surgeon: Rolm Bookbinder, MD;  Location: [facility];  Service: General;  Laterality: Bilateral;  . PORT-A-CATH REMOVAL Right 01/27/2017   Procedure: REMOVAL PORT-A-CATH;  Surgeon: [name], MD;  Location: [facility];  Service: General;  Laterality: Right;  . PORTACATH PLACEMENT Right  01/09/2020   Procedure: INSERTION PORT-A-CATH;  Surgeon: Rolm Bookbinder, MD;  Location: [facility];  Service: General;  Laterality: Right;  . SUBMUCOSAL LIFTING INJECTION  02/01/2020   Procedure: SUBMUCOSAL LIFTING INJECTION;  Surgeon: Milus Banister, MD;  Location: [facility];  Service: Endoscopy;;  . TUBAL LIGATION    . WISDOM TOOTH EXTRACTION      Family History  Problem Relation Age of Onset  . Diabetes Mother   . Hypertension Mother   . Arthritis Mother   . Colon cancer Father 67       died .76 metastatic at time of diagnosis  . Diabetes Father   . Heart disease Father   . Hypertension Father   . Breast cancer Maternal Grandmother 23       d.60s  . Breast cancer Maternal Aunt 52  . Cervical cancer Maternal Aunt 68  . Breast cancer Maternal Aunt 46       d.50s  . Cancer Maternal Uncle  d.62s unspecified type of cancer  . Breast cancer Cousin 6       paternal first-cousin (daughter of unaffected aunt)  . Cancer Maternal Aunt 33       "Female Cancer"  . Cancer Maternal Aunt        unknown cancer  . Brain cancer Daughter 24  . Esophageal cancer Other   . Colon polyps Neg Hx   . Rectal cancer Neg Hx   . Stomach cancer Neg Hx     Social History   Social History Narrative  . Not on file     Review of Systems General: Denies fevers, chills, weight loss CV: Denies chest pain, shortness of breath, palpitations  Physical Exam Vitals with BMI 05/27/2020 05/23/2020 04/18/2020  Height 5' 7"  5' 7"  5' 7"   Weight 264 lbs 260 lbs 10 oz 260 lbs  BMI 41.34 15.86 82.57  Systolic 493 552 174  Diastolic 82 85 77  Pulse 94 91 100    General:  No acute distress,  Alert and oriented, Non-Toxic, Normal speech and affect Examination shows a chronic left chest wound with granulation tissue at the base.  There is still some fibrinous exudate.  There is a little bit of undermining circumferentially primarily laterally.  There does not appear to be any  purulence or signs of infection.  She has fairly significant radiation fibrosis throughout the skin of the left chest wall.  There is a transverse scar and a vertical scar with the wound being right at the junction.  There is no scars on her back.  Assessment/Plan Patient presents with a chronic left chest wound in the setting of prior mastectomies and radiation.  She is currently undergoing hyperbaric treatment.  There are plans to start a chemotherapy regimen in a month or so.  We discussed 2 surgical options for wound closure.  The more involved procedure would be a latissimus flap which would enable me to remove most of the radiated skin in the chest and replace it with nonirradiated tissue.  It is a larger procedure that would require hospital stay in a more involved recovery.  Alternatively rotational flap from the abdomen would likely be able to close the defect I would have the downside of a scar that goes down onto her abdomen.  It would be a relatively simpler procedure and should have a high likelihood of success of getting the wound closed.  She is going to think about this and talk it over with her husband.  I will message her other doctors on the team to discuss how the impact might influence her other treatments.  All of her questions were answered.  Cindra Presume 05/27/2020, 4:08 PM

## 2020-05-27 NOTE — Progress Notes (Signed)
[name], [name] ([id number]) Visit Report for 05/23/2020 Arrival Information Details Patient Name: Date of Service: Sarah [name] [name] 05/23/2020 8:00 Sarah M Medical Record Number: [id number] Patient Account Number: [id number] Date of Birth/Sex: Treating RN: [birth year] (50 y.o. [name] Primary Care Gabreil Yonkers: [name] Other Clinician: Referring Kiara Keep: Treating Daniel Ritthaler/Extender: [name] in Treatment: 6 Visit Information History Since Last Visit Added or deleted any medications: No Patient Arrived: Ambulatory Any new allergies or adverse reactions: No Arrival Time: 08:00 Had Sarah fall or experienced change in No Accompanied By: alone activities of daily living that may affect Transfer Assistance: None risk of falls: Patient Identification Verified: Yes Signs or symptoms of abuse/neglect since last visito No Secondary Verification Process Completed: Yes Hospitalized since last visit: No Patient Requires Transmission-Based Precautions: No Implantable device outside of the clinic excluding No Patient Has Alerts: No cellular tissue based products placed in the center since last visit: Has Dressing in Place as Prescribed: Yes Pain Present Now: No Electronic Signature(s) Signed: 05/27/2020 5:27:53 PM By: Levan Hurst RN, BSN Entered By: Levan Hurst on 05/23/2020 08:33:05 -------------------------------------------------------------------------------- Encounter Discharge Information Details Patient Name: Date of Service: Sarah Crochet V [name] 05/23/2020 8:00 Sarah M Medical Record Number: [id number] Patient Account Number: [id number] Date of Birth/Sex: Treating RN: 04-19-1970 (50 y.o. [name] Primary Care Hooria Gasparini: [name] Other Clinician: Referring Tiera Mensinger: Treating Jaun Galluzzo/Extender: [name] in Treatment: 6 Encounter Discharge Information Items Discharge Condition:  Stable Ambulatory Status: Ambulatory Discharge Destination: Home Transportation: Private Auto Accompanied By: alone Schedule Follow-up Appointment: Yes Clinical Summary of Care: Patient Declined Electronic Signature(s) Signed: 05/27/2020 5:27:53 PM By: Levan Hurst RN, BSN Entered By: Levan Hurst on 05/23/2020 10:46:01 -------------------------------------------------------------------------------- Vitals Details Patient Name: Date of Service: Sarah Leisure, [name] 05/23/2020 8:00 Sarah M Medical Record Number: [id number] Patient Account Number: [id number] Date of Birth/Sex: Treating RN: August 12, 1970 (50 y.o. [name] Primary Care Clarke Amburn: [name] Other Clinician: Referring Tyliek Timberman: Treating Yamilka Lopiccolo/Extender: [name] in Treatment: 6 Vital Signs Time Taken: 08:00 Temperature (F): 98.2 Height (in): 67 Pulse (bpm): 120 Weight (lbs): 257 Respiratory Rate (breaths/min): 18 Body Mass Index (BMI): 40.2 Blood Pressure (mmHg): 149/93 Reference Range: 80 - 120 mg / dl Electronic Signature(s) Signed: 05/27/2020 5:27:53 PM By: Levan Hurst RN, BSN Entered By: Levan Hurst on 05/23/2020 08:33:26

## 2020-05-27 NOTE — Progress Notes (Signed)
[name], [name] ([id number]) Visit Report for 05/23/2020 HBO Details Patient Name: Date of Service: [name] Sarah [name] [name]. 05/23/2020 8:00 Sarah M Medical Record Number: [id number] Patient Account Number: [id number] Date of Birth/Sex: Treating RN: 11-29-1970 (50 y.o. 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 HBO Treatment Details Treatment Number: 27 Patient Type: Outpatient Chamber Type: Monoplace Chamber Serial #: [id number] Treatment Protocol: 2.5 ATA with 90 minutes oxygen, with two 5 minute air breaks Treatment Details Compression Rate Down: 2.0 psi / minute De-Compression Rate Up: 2.0 psi / minute Sarah breaks and breathing ir Compress Tx Pressure periods Decompress Decompress Begins Reached (leave unused spaces Begins Ends blank) Chamber Pressure (ATA 1 2.5 2.5 2.5 2.5 2.5 - - 2.5 1 ) Clock Time (24 hr) 08:08 08:20 08:50 08:55 09:25 09:30 - - 10:00 10:12 Treatment Length: 124 (minutes) Treatment Segments: 4 Vital Signs Capillary Blood Glucose Reference Range: 80 - 120 mg / dl HBO Diabetic Blood Glucose Intervention Range: <131 mg/dl or >249 mg/dl Time Vitals Blood Respiratory Capillary Blood Glucose Pulse Action Type: Pulse: Temperature: Taken: Pressure: Rate: Glucose (mg/dl): Meter #: Oximetry (%) Taken: Pre 08:00 149/93 120 18 98.2 Post 10:12 120/85 86 18 97.9 Treatment Response Treatment Completion Status: Treatment Completed without Adverse Event Terren Jandreau Notes No concerns with treatment given Physician HBO Attestation: I certify that I supervised this HBO treatment in accordance with Medicare guidelines. Sarah trained  emergency response team is readily available per Yes hospital policies and procedures. Continue HBOT as ordered. Yes Electronic Signature(s) Signed: 05/23/2020 4:52:14 PM By: [name] MD Entered By: [name] on 05/23/2020 16:42:47 -------------------------------------------------------------------------------- HBO Safety Checklist Details Patient Name: Date of Service: [name], [name]. 05/23/2020 8:00 Sarah M Medical Record Number: [id number] Patient Account Number: [id number] Date of Birth/Sex: Treating RN: October 07, 1970 (50 y.o. [name] Primary Care Quanta Roher: [name] Other Clinician: Referring Blondine Hottel: Treating Addi Pak/Extender: [name] in Treatment: 6 HBO Safety Checklist Items Safety Checklist Consent Form Signed Patient voided / foley secured and emptied When did you last eato 0700 Last dose of injectable or oral agent na Ostomy pouch emptied and vented if applicable NA All implantable devices assessed, documented and approved Intravenous access site secured and place NA Valuables secured Linens and cotton and cotton/polyester blend (less than 51% polyester) Personal oil-based products / skin lotions / body lotions removed Wigs or hairpieces removed Smoking or tobacco materials removed Books / newspapers / magazines / loose paper removed Cologne, aftershave, perfume and deodorant removed Jewelry removed (may wrap wedding band) Make-up removed Hair care products removed Battery operated devices (external) removed Heating patches and chemical warmers removed Titanium eyewear removed Nail polish cured greater than 10 hours Casting material cured greater than 10 hours NA Hearing aids removed NA Loose dentures or partials removed Prosthetics have been removed NA Patient demonstrates correct use of air break device (if applicable) Patient concerns have been addressed Patient grounding bracelet on and  cord attached to chamber Specifics for Inpatients (complete in addition to above) Medication sheet sent with patient NA Intravenous medications needed or due during therapy sent with patient NA Drainage tubes (e.g. nasogastric tube or chest tube secured and vented) NA Endotracheal or  Tracheotomy tube secured NA Cuff deflated of air and inflated with saline NA Airway suctioned NA Electronic Signature(s) Signed: 05/27/2020 5:27:53 PM By: Levan Hurst RN, BSN Entered By: Levan Hurst on 05/23/2020 08:34:07

## 2020-05-27 NOTE — Progress Notes (Signed)
EFFA, YARROW ([id number]) Visit Report for 05/24/2020 Arrival Information Details Patient Name: Date of Service: [name] 05/24/2020 8:00 A M Medical Record Number: [id number] Patient Account Number: [id number] Date of Birth/Sex: Treating RN: 01/30/1970 (50 y.o. [name], [name] Primary Care Eloina Ergle: [name] Other Clinician: Referring Lukasz Rogus: Treating Brittni Hult/Extender: [name] in Treatment: 6 Visit Information History Since Last Visit Added or deleted any medications: No Patient Arrived: Ambulatory Any new allergies or adverse reactions: No Arrival Time: 07:48 Had a fall or experienced change in No Accompanied By: alone activities of daily living that may affect Transfer Assistance: None risk of falls: Patient Identification Verified: Yes Signs or symptoms of abuse/neglect since last visito No Secondary Verification Process Completed: Yes Hospitalized since last visit: No Patient Requires Transmission-Based Precautions: No Implantable device outside of the clinic excluding No Patient Has Alerts: No cellular tissue based products placed in the center since last visit: Has Dressing in Place as Prescribed: Yes Pain Present Now: No Electronic Signature(s) Signed: 05/27/2020 5:27:53 PM By: Levan Hurst RN, BSN Entered By: Levan Hurst on 05/24/2020 08:40:55 -------------------------------------------------------------------------------- Encounter Discharge Information Details Patient Name: Date of Service: [name] 05/24/2020 8:00 A M Medical Record Number: [id number] Patient Account Number: [id number] Date of Birth/Sex: Treating RN: 02/08/1970 (50 y.o. [name] Primary Care Violet Seabury: [name] Other Clinician: Referring Marilea Gwynne: Treating Jaskiran Pata/Extender: [name] in Treatment: 6 Encounter Discharge Information Items Discharge Condition:  Stable Ambulatory Status: Ambulatory Discharge Destination: Home Transportation: Private Auto Accompanied By: alone Schedule Follow-up Appointment: Yes Clinical Summary of Care: Patient Declined Electronic Signature(s) Signed: 05/27/2020 5:27:53 PM By: Levan Hurst RN, BSN Entered By: Levan Hurst on 05/24/2020 10:38:51 -------------------------------------------------------------------------------- Vitals Details Patient Name: Date of Service: [name] 05/24/2020 8:00 A M Medical Record Number: [id number] Patient Account Number: [id number] Date of Birth/Sex: Treating RN: 1970-10-09 (50 y.o. [name] Primary Care Wanetta Funderburke: [name] Other Clinician: Referring Caliope Ruppert: Treating Jesseca Marsch/Extender: [name] in Treatment: 6 Vital Signs Time Taken: 07:48 Temperature (F): 97.9 Height (in): 67 Pulse (bpm): 90 Weight (lbs): 257 Respiratory Rate (breaths/min): 16 Body Mass Index (BMI): 40.2 Blood Pressure (mmHg): 117/83 Reference Range: 80 - 120 mg / dl Electronic Signature(s) Signed: 05/27/2020 5:27:53 PM By: Levan Hurst RN, BSN Entered By: Levan Hurst on 05/24/2020 08:41:22

## 2020-05-27 NOTE — Progress Notes (Signed)
[name], [name] ([id number]) Visit Report for 05/23/2020 SuperBill Details Patient Name: Date of Service: [name] [name] 05/23/2020 Medical Record Number: [id number] Patient Account Number: [id number] Date of Birth/Sex: Treating RN: 03-May-1970 (50 y.o. 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 Disruption of external operation (surgical) wound, not elsewhere classified, subsequent encounter 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 Facility Procedures CPT4 Code Description Modifier Quantity [id number]-(Facility Use Only) HBOT full body chamber, 88min , 4 Physician Procedures Quantity CPT4 Code Description Modifier [id number] 73428 - WC PHYS HYPERBARIC OXYGEN THERAPY 1 ICD-10 Diagnosis Description 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

## 2020-05-27 NOTE — Progress Notes (Signed)
TAMELA, ELSAYED ([id number]) Visit Report for 05/24/2020 HBO Details Patient Name: Date of Service: [name] 05/24/2020 8:00 A M Medical Record Number: [id number] Patient Account Number: [id number] Date of Birth/Sex: Treating RN: 1970/12/05 (50 y.o. 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 HBO Treatment Details Treatment Number: 28 Patient Type: Outpatient Chamber Type: Monoplace Chamber Serial #: [id number] Treatment Protocol: 2.5 ATA with 90 minutes oxygen, with two 5 minute air breaks Treatment Details Compression Rate Down: 2.0 psi / minute De-Compression Rate Up: 2.0 psi / minute A breaks and breathing ir Compress Tx Pressure periods Decompress Decompress Begins Reached (leave unused spaces Begins Ends blank) Chamber Pressure (ATA 1 2.5 2.5 2.5 2.5 2.5 - - 2.5 1 ) Clock Time (24 hr) 08:03 08:15 08:35 08:40 09:20 09:25 - - 09:55 10:07 Treatment Length: 124 (minutes) Treatment Segments: 4 Vital Signs Capillary Blood Glucose Reference Range: 80 - 120 mg / dl HBO Diabetic Blood Glucose Intervention Range: <131 mg/dl or >249 mg/dl Time Vitals Blood Respiratory Capillary Blood Glucose Pulse Action Type: Pulse: Temperature: Taken: Pressure: Rate: Glucose (mg/dl): Meter #: Oximetry (%) Taken: Pre 07:48 117/83 90 16 97.9 Post 10:07 124/71 83 16 97.9 Treatment Response Treatment Completion Status: Treatment Completed without Adverse Event Aleiya Rye Notes No concerns with treatment given Physician HBO Attestation: I certify that I supervised this HBO treatment in accordance with Medicare guidelines. A trained  emergency response team is readily available per Yes hospital policies and procedures. Continue HBOT as ordered. Yes Electronic Signature(s) Signed: 05/24/2020 4:33:12 PM By: [name] MD Entered By: [name] on 05/24/2020 12:31:09 -------------------------------------------------------------------------------- HBO Safety Checklist Details Patient Name: Date of Service: [name] 05/24/2020 8:00 A M Medical Record Number: [id number] Patient Account Number: [id number] Date of Birth/Sex: Treating RN: 06-24-1970 (50 y.o. [name] Primary Care Augusten Lipkin: [name] Other Clinician: Referring Aarian Griffie: Treating Singleton Hickox/Extender: [name] in Treatment: 6 HBO Safety Checklist Items Safety Checklist Consent Form Signed Patient voided / foley secured and emptied When did you last eato 0700 Last dose of injectable or oral agent na Ostomy pouch emptied and vented if applicable NA All implantable devices assessed, documented and approved Intravenous access site secured and place NA Valuables secured Linens and cotton and cotton/polyester blend (less than 51% polyester) Personal oil-based products / skin lotions / body lotions removed Wigs or hairpieces removed Smoking or tobacco materials removed Books / newspapers / magazines / loose paper removed Cologne, aftershave, perfume and deodorant removed Jewelry removed (may wrap wedding band) Make-up removed Hair care products removed Battery operated devices (external) removed Heating patches and chemical warmers removed Titanium eyewear removed Nail polish cured greater than 10 hours Casting material cured greater than 10 hours NA Hearing aids removed NA Loose dentures or partials removed Prosthetics have been removed NA Patient demonstrates correct use of air break device (if applicable) Patient concerns have been addressed Patient grounding bracelet on and  cord attached to chamber Specifics for Inpatients (complete in addition to above) Medication sheet sent with patient NA Intravenous medications needed or due during therapy sent with patient NA Drainage tubes (e.g. nasogastric tube or chest tube secured and vented) NA Endotracheal or  Tracheotomy tube secured NA Cuff deflated of air and inflated with saline NA Airway suctioned NA Electronic Signature(s) Signed: 05/27/2020 5:27:53 PM By: Levan Hurst RN, BSN Entered By: Levan Hurst on 05/24/2020 08:41:56

## 2020-05-27 NOTE — Progress Notes (Signed)
KELLY, RANIERI ([id number]) Visit Report for 05/27/2020 Arrival Information Details Patient Name: Date of Service: [name] 05/27/2020 8:00 A M Medical Record Number: [id number] Patient Account Number: [id number] Date of Birth/Sex: Treating RN: [birth year] (50 y.o. [name], [name] Primary Care Acey Woodfield: [name] Other Clinician: Referring Kora Groom: Treating Demba Nigh/Extender: [name] in Treatment: 6 Visit Information History Since Last Visit Added or deleted any medications: No Patient Arrived: Ambulatory Any new allergies or adverse reactions: No Arrival Time: 07:40 Had a fall or experienced change in No Accompanied By: self activities of daily living that may affect Transfer Assistance: None risk of falls: Patient Identification Verified: Yes Signs or symptoms of abuse/neglect since last visito No Secondary Verification Process Completed: Yes Hospitalized since last visit: No Patient Requires Transmission-Based Precautions: No Implantable device outside of the clinic excluding No Patient Has Alerts: No cellular tissue based products placed in the center since last visit: Pain Present Now: No Electronic Signature(s) Signed: 05/27/2020 6:20:16 PM By: Deon Pilling Entered By: Deon Pilling on 05/27/2020 10:56:52 -------------------------------------------------------------------------------- Encounter Discharge Information Details Patient Name: Date of Service: [name] 05/27/2020 8:00 A M Medical Record Number: [id number] Patient Account Number: [id number] Date of Birth/Sex: Treating RN: 27-Oct-1970 (50 y.o. [name] Primary Care Jera Headings: [name] Other Clinician: Referring Elodie Panameno: Treating Tahjay Binion/Extender: [name] in Treatment: 6 Encounter Discharge Information Items Discharge Condition: Stable Ambulatory Status: Ambulatory Discharge Destination:  Home Transportation: Private Auto Accompanied By: self Schedule Follow-up Appointment: Yes Clinical Summary of Care: Electronic Signature(s) Signed: 05/27/2020 6:20:16 PM By: Deon Pilling Entered By: Deon Pilling on 05/27/2020 10:59:31 -------------------------------------------------------------------------------- Vitals Details Patient Name: Date of Service: [name] 05/27/2020 8:00 A M Medical Record Number: [id number] Patient Account Number: [id number] Date of Birth/Sex: Treating RN: [birth year] (50 y.o. [name], [name] Primary Care Sevana Grandinetti: [name] Other Clinician: Referring Sheldon Sem: Treating Gaylon Melchor/Extender: [name] in Treatment: 6 Vital Signs Time Taken: 07:40 Temperature (F): 97.9 Height (in): 67 Pulse (bpm): 85 Weight (lbs): 257 Respiratory Rate (breaths/min): 16 Body Mass Index (BMI): 40.2 Blood Pressure (mmHg): 130/76 Reference Range: 80 - 120 mg / dl Electronic Signature(s) Signed: 05/27/2020 6:20:16 PM By: Deon Pilling Entered By: Deon Pilling on 05/27/2020 10:57:06

## 2020-05-28 ENCOUNTER — Encounter (HOSPITAL_BASED_OUTPATIENT_CLINIC_OR_DEPARTMENT_OTHER): Payer: Medicaid Other | Admitting: Internal Medicine

## 2020-05-28 ENCOUNTER — Telehealth: Payer: Self-pay

## 2020-05-28 ENCOUNTER — Other Ambulatory Visit: Payer: Self-pay | Admitting: Oncology

## 2020-05-28 DIAGNOSIS — C50412 Malignant neoplasm of upper-outer quadrant of left female breast: Secondary | ICD-10-CM | POA: Diagnosis not present

## 2020-05-28 NOTE — Progress Notes (Signed)
TRAMEKA, DOROUGH ([id number]) Visit Report for 05/28/2020 Arrival Information Details Patient Name: Date of Service: [name] 05/28/2020 8:00 A M Medical Record Number: [id number] Patient Account Number: [id number] Date of Birth/Sex: Treating RN: [birth year] (50 y.o. [name], [name] Primary Care Perline Awe: [name] Other Clinician: Referring Renai Lopata: Treating Merl Bommarito/Extender: Alverda Skeans in Treatment: 7 Visit Information History Since Last Visit Added or deleted any medications: No Patient Arrived: Ambulatory Any new allergies or adverse reactions: No Arrival Time: 07:53 Had a fall or experienced change in No Accompanied By: self activities of daily living that may affect Transfer Assistance: None risk of falls: Patient Identification Verified: Yes Signs or symptoms of abuse/neglect since last visito No Secondary Verification Process Completed: Yes Hospitalized since last visit: No Patient Requires Transmission-Based Precautions: No Implantable device outside of the clinic excluding No Patient Has Alerts: No cellular tissue based products placed in the center since last visit: Has Dressing in Place as Prescribed: Yes Pain Present Now: No Electronic Signature(s) Signed: 05/28/2020 5:25:37 PM By: Deon Pilling Entered By: Deon Pilling on 05/28/2020 10:33:22 -------------------------------------------------------------------------------- Encounter Discharge Information Details Patient Name: Date of Service: [name] 05/28/2020 8:00 A M Medical Record Number: [id number] Patient Account Number: [id number] Date of Birth/Sex: Treating RN: 23-Aug-1970 (50 y.o. [name] Primary Care Zalen Sequeira: [name] Other Clinician: Referring Toya Palacios: Treating Eliodoro Gullett/Extender: Alverda Skeans in Treatment: 7 Encounter Discharge Information Items Discharge Condition: Stable Ambulatory Status: Ambulatory Discharge  Destination: Home Transportation: Private Auto Accompanied By: self Schedule Follow-up Appointment: Yes Clinical Summary of Care: Electronic Signature(s) Signed: 05/28/2020 5:25:37 PM By: Deon Pilling Entered By: Deon Pilling on 05/28/2020 10:35:25 -------------------------------------------------------------------------------- Vitals Details Patient Name: Date of Service: [name] 05/28/2020 8:00 A M Medical Record Number: [id number] Patient Account Number: [id number] Date of Birth/Sex: Treating RN: 01-15-1970 (50 y.o. [name], [name] Primary Care Cariann Kinnamon: [name] Other Clinician: Referring Takera Rayl: Treating Praneeth Bussey/Extender: Alverda Skeans in Treatment: 7 Vital Signs Time Taken: 07:53 Temperature (F): 98.2 Height (in): 67 Pulse (bpm): 81 Weight (lbs): 257 Respiratory Rate (breaths/min): 16 Body Mass Index (BMI): 40.2 Blood Pressure (mmHg): 129/83 Reference Range: 80 - 120 mg / dl Electronic Signature(s) Signed: 05/28/2020 5:25:37 PM By: Deon Pilling Entered By: Deon Pilling on 05/28/2020 10:33:38

## 2020-05-28 NOTE — Telephone Encounter (Signed)
RN left voicemail for return call.   Re: MRI appointment.

## 2020-05-28 NOTE — Progress Notes (Signed)
LYRICK, WORLAND ([id number]) Visit Report for 05/28/2020 SuperBill Details Patient Name: Date of Service: [name] 05/28/2020 Medical Record Number: [id number] Patient Account Number: [id number] Date of Birth/Sex: Treating RN: 1970-09-01 (50 y.o. 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

## 2020-05-29 ENCOUNTER — Encounter (HOSPITAL_BASED_OUTPATIENT_CLINIC_OR_DEPARTMENT_OTHER): Payer: Medicaid Other | Admitting: Internal Medicine

## 2020-05-29 DIAGNOSIS — C50412 Malignant neoplasm of upper-outer quadrant of left female breast: Secondary | ICD-10-CM | POA: Diagnosis not present

## 2020-05-29 NOTE — Progress Notes (Signed)
FELICITE, ZEIMET ([id number]) Visit Report for 05/29/2020 SuperBill Details Patient Name: Date of Service: [name] 05/29/2020 Medical Record Number: [id number] Patient Account Number: [id number] Date of Birth/Sex: Treating RN: 09-11-1970 (50 y.o. 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

## 2020-05-29 NOTE — Progress Notes (Signed)
SHANYN, PREISLER ([id number]) Visit Report for 05/29/2020 Arrival Information Details Patient Name: Date of Service: [name] 05/29/2020 8:00 A M Medical Record Number: [id number] Patient Account Number: [id number] Date of Birth/Sex: Treating RN: 20-Jun-1970 (50 y.o. [name], [name] Primary Care Braydn Carneiro: [name] Other Clinician: Referring Haruo Stepanek: Treating Mehek Grega/Extender: Alverda Skeans in Treatment: 7 Visit Information History Since Last Visit Added or deleted any medications: No Patient Arrived: Ambulatory Any new allergies or adverse reactions: No Arrival Time: 07:43 Had a fall or experienced change in No Accompanied By: self activities of daily living that may affect Transfer Assistance: None risk of falls: Patient Identification Verified: Yes Signs or symptoms of abuse/neglect since last visito No Secondary Verification Process Completed: Yes Hospitalized since last visit: No Patient Requires Transmission-Based Precautions: No Implantable device outside of the clinic excluding No Patient Has Alerts: No cellular tissue based products placed in the center since last visit: Has Dressing in Place as Prescribed: Yes Pain Present Now: No Electronic Signature(s) Signed: 05/29/2020 4:06:25 PM By: Deon Pilling Entered By: Deon Pilling on 05/29/2020 10:47:07 -------------------------------------------------------------------------------- Encounter Discharge Information Details Patient Name: Date of Service: [name] 05/29/2020 8:00 A M Medical Record Number: [id number] Patient Account Number: [id number] Date of Birth/Sex: Treating RN: 01-16-1970 (50 y.o. [name] Primary Care Edmund Holcomb: [name] Other Clinician: Referring Kol Consuegra: Treating Tirza Senteno/Extender: Alverda Skeans in Treatment: 7 Encounter Discharge Information Items Discharge Condition: Stable Ambulatory Status: Ambulatory Discharge  Destination: Home Transportation: Private Auto Accompanied By: self Schedule Follow-up Appointment: Yes Clinical Summary of Care: Electronic Signature(s) Signed: 05/29/2020 4:06:25 PM By: Deon Pilling Entered By: Deon Pilling on 05/29/2020 10:48:59 -------------------------------------------------------------------------------- Vitals Details Patient Name: Date of Service: [name] 05/29/2020 8:00 A M Medical Record Number: [id number] Patient Account Number: [id number] Date of Birth/Sex: Treating RN: 24-Sep-1970 (50 y.o. [name], [name] Primary Care Randall Rampersad: [name] Other Clinician: Referring Mahlik Lenn: Treating Ermine Spofford/Extender: Alverda Skeans in Treatment: 7 Vital Signs Time Taken: 07:43 Temperature (F): 97.9 Height (in): 67 Pulse (bpm): 81 Weight (lbs): 257 Respiratory Rate (breaths/min): 18 Body Mass Index (BMI): 40.2 Blood Pressure (mmHg): 134/86 Reference Range: 80 - 120 mg / dl Electronic Signature(s) Signed: 05/29/2020 4:06:25 PM By: Deon Pilling Entered By: Deon Pilling on 05/29/2020 10:47:19

## 2020-05-29 NOTE — Progress Notes (Signed)
MACIE, BAUM ([id number]) Visit Report for 05/28/2020 Arrival Information Details Patient Name: Date of Service: Sarah [name] [name] 05/28/2020 7:30 Sarah M Medical Record Number: [id number] Patient Account Number: [id number] Date of Birth/Sex: Treating RN: [birth year] (50 y.o. [name], [name] Primary Care Murle Otting: [name] Other Clinician: Referring Camron Essman: Treating Akshath Mccarey/Extender: [name] in Treatment: 7 Visit Information History Since Last Visit Added or deleted any medications: No Patient Arrived: Ambulatory Any new allergies or adverse reactions: No Arrival Time: 07:52 Had Sarah fall or experienced change in No Accompanied By: self activities of daily living that may affect Transfer Assistance: None risk of falls: Patient Identification Verified: Yes Signs or symptoms of abuse/neglect since last visito No Secondary Verification Process Completed: Yes Hospitalized since last visit: No Patient Requires Transmission-Based Precautions: No Implantable device outside of the clinic excluding No Patient Has Alerts: No cellular tissue based products placed in the center since last visit: Has Dressing in Place as Prescribed: Yes Pain Present Now: No Electronic Signature(s) Signed: 05/28/2020 4:53:55 PM By: Sandre Kitty Entered By: Sandre Kitty on 05/28/2020 07:53:19 -------------------------------------------------------------------------------- Clinic Level of Care Assessment Details Patient Name: Date of Service: Sarah [name] [name] 05/28/2020 7:30 Sarah M Medical Record Number: [id number] Patient Account Number: [id number] Date of Birth/Sex: Treating RN: 07/04/1970 (50 y.o. [name], [name] Primary Care Josy Peaden: [name] Other Clinician: Referring Mivaan Corbitt: Treating Jarmon Javid/Extender: [name] in Treatment: 7 Clinic Level of Care Assessment Items TOOL 4 Quantity Score X- 1  0 Use when only an EandM is performed on FOLLOW-UP visit ASSESSMENTS - Nursing Assessment / Reassessment X- 1 10 Reassessment of Co-morbidities (includes updates in patient status) X- 1 5 Reassessment of Adherence to Treatment Plan ASSESSMENTS - Wound and Skin Sarah ssessment / Reassessment X - Simple Wound Assessment / Reassessment - one wound 1 5 []  - 0 Complex Wound Assessment / Reassessment - multiple wounds X- 1 10 Dermatologic / Skin Assessment (not related to wound area) ASSESSMENTS - Focused Assessment []  - 0 Circumferential Edema Measurements - multi extremities []  - 0 Nutritional Assessment / Counseling / Intervention []  - 0 Lower Extremity Assessment (monofilament, tuning fork, pulses) []  - 0 Peripheral Arterial Disease Assessment (using hand held doppler) ASSESSMENTS - Ostomy and/or Continence Assessment and Care []  - 0 Incontinence Assessment and Management []  - 0 Ostomy Care Assessment and Management (repouching, etc.) PROCESS - Coordination of Care X - Simple Patient / Family Education for ongoing care 1 15 []  - 0 Complex (extensive) Patient / Family Education for ongoing care X- 1 10 Staff obtains Programmer, systems, Records, T Results / Process Orders est []  - 0 Staff telephones HHA, Nursing Homes / Clarify orders / etc []  - 0 Routine Transfer to another Facility (non-emergent condition) []  - 0 Routine Hospital Admission (non-emergent condition) []  - 0 New Admissions / Biomedical engineer / Ordering NPWT Apligraf, etc. , []  - 0 Emergency Hospital Admission (emergent condition) X- 1 10 Simple Discharge Coordination []  - 0 Complex (extensive) Discharge Coordination PROCESS - Special Needs []  - 0 Pediatric / Minor Patient Management []  - 0 Isolation Patient Management []  - 0 Hearing / Language / Visual special needs []  - 0 Assessment of Community assistance (transportation, D/C planning, etc.) []  - 0 Additional assistance / Altered mentation []  -  0 Support Surface(s) Assessment (bed, cushion, seat, etc.) INTERVENTIONS - Wound Cleansing / Measurement X - Simple Wound Cleansing - one wound 1 5 []  -  0 Complex Wound Cleansing - multiple wounds []  - 0 Wound Imaging (photographs - any number of wounds) []  - 0 Wound Tracing (instead of photographs) X- 1 5 Simple Wound Measurement - one wound []  - 0 Complex Wound Measurement - multiple wounds INTERVENTIONS - Wound Dressings X - Small Wound Dressing one or multiple wounds 1 10 []  - 0 Medium Wound Dressing one or multiple wounds []  - 0 Large Wound Dressing one or multiple wounds []  - 0 Application of Medications - topical []  - 0 Application of Medications - injection INTERVENTIONS - Miscellaneous []  - 0 External ear exam []  - 0 Specimen Collection (cultures, biopsies, blood, body fluids, etc.) []  - 0 Specimen(s) / Culture(s) sent or taken to Lab for analysis []  - 0 Patient Transfer (multiple staff / Civil Service fast streamer / Similar devices) []  - 0 Simple Staple / Suture removal (25 or less) []  - 0 Complex Staple / Suture removal (26 or more) []  - 0 Hypo / Hyperglycemic Management (close monitor of Blood Glucose) []  - 0 Ankle / Brachial Index (ABI) - do not check if billed separately X- 1 5 Vital Signs Has the patient been seen at the hospital within the last three years: Yes Total Score: 90 Level Of Care: New/Established - Level 3 Electronic Signature(s) Signed: 05/29/2020 4:26:30 PM By: Rhae Hammock RN Entered By: Rhae Hammock on 05/28/2020 09:58:55 -------------------------------------------------------------------------------- Encounter Discharge Information Details Patient Name: Date of Service: [name] [name] 05/28/2020 7:30 Sarah M Medical Record Number: [id number] Patient Account Number: [id number] Date of Birth/Sex: Treating RN: [birth year] (50 y.o. [name], [name] Primary Care Shakeia Krus: [name] Other Clinician: Referring Lyne Khurana: Treating  Mitchell Iwanicki/Extender: [name] in Treatment: 7 Encounter Discharge Information Items Discharge Condition: Stable Ambulatory Status: Ambulatory Discharge Destination: Home Transportation: Private Auto Accompanied By: self Schedule Follow-up Appointment: Yes Clinical Summary of Care: Patient Declined Electronic Signature(s) Signed: 05/29/2020 4:26:30 PM By: Rhae Hammock RN Entered By: Rhae Hammock on 05/28/2020 09:57:33 -------------------------------------------------------------------------------- Patient/Caregiver Education Details Patient Name: Date of Service: [name] [name] 5/24/2022andnbsp7:30 Sarah M Medical Record Number: [id number] Patient Account Number: [id number] Date of Birth/Gender: Treating RN: [birth year] (50 y.o. [name], [name] Primary Care Physician: [name] Other Clinician: Referring Physician: Treating Physician/Extender: [name] in Treatment: 7 Education Assessment Education Provided To: Patient Education Topics Provided Wound/Skin Impairment: Methods: Occupational psychologist) Signed: 05/29/2020 4:26:30 PM By: Rhae Hammock RN Entered By: Rhae Hammock on 05/28/2020 09:57:19 -------------------------------------------------------------------------------- Wound Assessment Details Patient Name: Date of Service: [name] [name] 05/28/2020 7:30 Sarah M Medical Record Number: [id number] Patient Account Number: [id number] Date of Birth/Sex: Treating RN: April 03, 1970 (50 y.o. [name], [name] Primary Care Simcha Farrington: [name] Other Clinician: Referring Mykiah Schmuck: Treating Zeeshan Korte/Extender: [name] in Treatment: 7 Wound Status Wound Number: 1 Primary Etiology: Open Surgical Wound Wound Location: Left Chest Secondary Etiology: Necrosis (Radiation) Wounding Event: Surgical Injury Wound Status: Open Date  Acquired: 01/16/2020 Weeks Of Treatment: 7 Clustered Wound: No Wound Measurements Length: (cm) 5 Width: (cm) 4.4 Depth: (cm) 2 Area: (cm) 17.279 Volume: (cm) 34.558 % Reduction in Area: -171.6% % Reduction in Volume: -393.8% Wound Description Classification: Full Thickness Without Exposed Support Structur es Treatment Notes Wound #1 (Chest) Wound Laterality: Left Cleanser Normal Saline Discharge Instruction: Cleanse the wound with Normal Saline prior to applying Sarah clean dressing using gauze sponges, not tissue or cotton balls. Peri-Wound Care Topical Primary  Dressing IODOFLEX 0.9% Cadexomer Iodine Pad 4x6 cm Discharge Instruction: Apply to wound bed as instructed Secondary Dressing Zetuvit Plus 4x4 in Discharge Instruction: Apply over primary dressing as directed. Secured With Compression Wrap Compression Stockings Add-Ons Education officer, community) Signed: 05/28/2020 4:53:55 PM By: Sandre Kitty Signed: 05/29/2020 4:26:30 PM By: Rhae Hammock RN Entered By: Sandre Kitty on 05/28/2020 07:53:44 -------------------------------------------------------------------------------- Vitals Details Patient Name: Date of Service: [name] [name] 05/28/2020 7:30 Sarah M Medical Record Number: [id number] Patient Account Number: [id number] Date of Birth/Sex: Treating RN: June 08, 1970 (50 y.o. [name], [name] Primary Care Loys Hoselton: [name] Other Clinician: Referring Armando Bukhari: Treating Lonnie Rosado/Extender: [name] in Treatment: 7 Vital Signs Time Taken: 07:53 Temperature (F): 98.2 Height (in): 67 Pulse (bpm): 81 Weight (lbs): 257 Respiratory Rate (breaths/min): 16 Body Mass Index (BMI): 40.2 Blood Pressure (mmHg): 129/83 Reference Range: 80 - 120 mg / dl Electronic Signature(s) Signed: 05/28/2020 4:53:55 PM By: Sandre Kitty Entered By: Sandre Kitty on 05/28/2020 07:53:36

## 2020-05-30 ENCOUNTER — Encounter (HOSPITAL_BASED_OUTPATIENT_CLINIC_OR_DEPARTMENT_OTHER): Payer: Medicaid Other | Admitting: Internal Medicine

## 2020-05-30 ENCOUNTER — Other Ambulatory Visit: Payer: Self-pay

## 2020-05-30 DIAGNOSIS — L59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: Secondary | ICD-10-CM | POA: Diagnosis not present

## 2020-05-30 DIAGNOSIS — D0512 Intraductal carcinoma in situ of left breast: Secondary | ICD-10-CM

## 2020-05-30 DIAGNOSIS — C50412 Malignant neoplasm of upper-outer quadrant of left female breast: Secondary | ICD-10-CM | POA: Diagnosis not present

## 2020-05-31 ENCOUNTER — Encounter (HOSPITAL_BASED_OUTPATIENT_CLINIC_OR_DEPARTMENT_OTHER): Payer: Medicaid Other | Admitting: Internal Medicine

## 2020-05-31 ENCOUNTER — Ambulatory Visit (HOSPITAL_COMMUNITY)
Admission: RE | Admit: 2020-05-31 | Discharge: 2020-05-31 | Disposition: A | Discharge status: Home / Self Care | Payer: Medicaid Other | Source: Ambulatory Visit | Attending: Oncology | Admitting: Oncology

## 2020-05-31 ENCOUNTER — Other Ambulatory Visit: Payer: Self-pay

## 2020-05-31 DIAGNOSIS — Z1501 Genetic susceptibility to malignant neoplasm of breast: Secondary | ICD-10-CM | POA: Insufficient documentation

## 2020-05-31 DIAGNOSIS — Z1509 Genetic susceptibility to other malignant neoplasm: Secondary | ICD-10-CM | POA: Insufficient documentation

## 2020-05-31 DIAGNOSIS — Z9013 Acquired absence of bilateral breasts and nipples: Secondary | ICD-10-CM

## 2020-05-31 DIAGNOSIS — C50412 Malignant neoplasm of upper-outer quadrant of left female breast: Secondary | ICD-10-CM | POA: Insufficient documentation

## 2020-05-31 DIAGNOSIS — C50912 Malignant neoplasm of unspecified site of left female breast: Secondary | ICD-10-CM | POA: Insufficient documentation

## 2020-05-31 DIAGNOSIS — Z171 Estrogen receptor negative status [ER-]: Secondary | ICD-10-CM | POA: Insufficient documentation

## 2020-05-31 DIAGNOSIS — Z1502 Genetic susceptibility to malignant neoplasm of ovary: Secondary | ICD-10-CM | POA: Insufficient documentation

## 2020-05-31 DIAGNOSIS — L59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: Secondary | ICD-10-CM | POA: Diagnosis not present

## 2020-05-31 DIAGNOSIS — D0512 Intraductal carcinoma in situ of left breast: Secondary | ICD-10-CM | POA: Diagnosis not present

## 2020-05-31 MED ORDER — GADOBUTROL 1 MMOL/ML IV SOLN
10.0000 mL | Freq: Once | INTRAVENOUS | Status: AC | PRN
  Administered 2020-05-31: 10 mL via INTRAVENOUS

## 2020-06-03 ENCOUNTER — Other Ambulatory Visit: Payer: Self-pay | Admitting: Oncology

## 2020-06-03 MED ORDER — TRAMADOL HCL 50 MG PO TABS
ORAL_TABLET | Freq: Four times a day (QID) | ORAL | 0 refills | Status: DC | PRN
  Filled 2020-06-03: qty 20, 3d supply, fill #0

## 2020-06-03 NOTE — Progress Notes (Unsigned)
I called [name] and told her that these MRI results are very reassuring.  The tissue does not enhance like cancer and is not invasive like cancer.  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

## 2020-06-04 ENCOUNTER — Other Ambulatory Visit (HOSPITAL_COMMUNITY): Payer: Self-pay

## 2020-06-04 ENCOUNTER — Encounter (HOSPITAL_BASED_OUTPATIENT_CLINIC_OR_DEPARTMENT_OTHER): Payer: Medicaid Other | Admitting: Internal Medicine

## 2020-06-04 ENCOUNTER — Other Ambulatory Visit: Payer: Self-pay

## 2020-06-04 DIAGNOSIS — C50412 Malignant neoplasm of upper-outer quadrant of left female breast: Secondary | ICD-10-CM | POA: Diagnosis not present

## 2020-06-04 NOTE — Progress Notes (Signed)
BRIDGETT, HATTABAUGH ([id number]) Visit Report for 05/30/2020 Arrival Information Details Patient Name: Date of Service: [name] 05/30/2020 8:00 A M Medical Record Number: [id number] Patient Account Number: [id number] Date of Birth/Sex: Treating RN: 06-Jun-1970 (50 y.o. [name], [name] Primary Care Angelos Wasco: [name] Other Clinician: Referring Bianka Liberati: Treating Prisha Hiley/Extender: [name] in Treatment: 7 Visit Information History Since Last Visit Added or deleted any medications: No Patient Arrived: Ambulatory Any new allergies or adverse reactions: No Arrival Time: 07:46 Had a fall or experienced change in No Accompanied By: alone activities of daily living that may affect Transfer Assistance: None risk of falls: Patient Identification Verified: Yes Signs or symptoms of abuse/neglect since last visito No Secondary Verification Process Completed: Yes Hospitalized since last visit: No Patient Requires Transmission-Based Precautions: No Implantable device outside of the clinic excluding No Patient Has Alerts: No cellular tissue based products placed in the center since last visit: Has Dressing in Place as Prescribed: Yes Pain Present Now: No Electronic Signature(s) Signed: 06/04/2020 5:56:30 PM By: Levan Hurst RN, BSN Entered By: Levan Hurst on 05/30/2020 08:46:06 -------------------------------------------------------------------------------- Encounter Discharge Information Details Patient Name: Date of Service: [name] 05/30/2020 8:00 A M Medical Record Number: [id number] Patient Account Number: [id number] Date of Birth/Sex: Treating RN: March 28, 1970 (50 y.o. [name] Primary Care Dene Landsberg: [name] Other Clinician: Referring Olyn Landstrom: Treating Jiselle Sheu/Extender: [name] in Treatment: 7 Encounter Discharge Information Items Discharge Condition:  Stable Ambulatory Status: Ambulatory Discharge Destination: Home Transportation: Private Auto Accompanied By: alone Schedule Follow-up Appointment: Yes Clinical Summary of Care: Patient Declined Electronic Signature(s) Signed: 06/04/2020 5:56:30 PM By: Levan Hurst RN, BSN Entered By: Levan Hurst on 05/30/2020 10:55:52 -------------------------------------------------------------------------------- Vitals Details Patient Name: Date of Service: [name] 05/30/2020 8:00 A M Medical Record Number: [id number] Patient Account Number: [id number] Date of Birth/Sex: Treating RN: 10-Oct-1970 (50 y.o. [name] Primary Care Jawanda Passey: [name] Other Clinician: Referring Kirstyn Lean: Treating Camilo Mander/Extender: [name] in Treatment: 7 Vital Signs Time Taken: 07:46 Temperature (F): 98.1 Height (in): 67 Pulse (bpm): 83 Weight (lbs): 257 Respiratory Rate (breaths/min): 18 Body Mass Index (BMI): 40.2 Blood Pressure (mmHg): 123/82 Reference Range: 80 - 120 mg / dl Electronic Signature(s) Signed: 06/04/2020 5:56:30 PM By: Levan Hurst RN, BSN Entered By: Levan Hurst on 05/30/2020 08:46:27

## 2020-06-04 NOTE — Progress Notes (Signed)
NISHI, NEISWONGER ([id number]) Visit Report for 05/31/2020 Arrival Information Details Patient Name: Date of Service: [name] 05/31/2020 8:00 A M Medical Record Number: [id number] Patient Account Number: [id number] Date of Birth/Sex: Treating RN: May 28, 1970 (50 y.o. [name], [name] Primary Care Marycarmen Hagey: [name] Other Clinician: Referring Becci Batty: Treating Isabellamarie Randa/Extender: [name] in Treatment: 7 Visit Information History Since Last Visit Added or deleted any medications: No Patient Arrived: Ambulatory Any new allergies or adverse reactions: No Arrival Time: 07:51 Had a fall or experienced change in No Accompanied By: alone activities of daily living that may affect Transfer Assistance: None risk of falls: Patient Identification Verified: Yes Signs or symptoms of abuse/neglect since last visito No Secondary Verification Process Completed: Yes Hospitalized since last visit: No Patient Requires Transmission-Based Precautions: No Implantable device outside of the clinic excluding No Patient Has Alerts: No cellular tissue based products placed in the center since last visit: Has Dressing in Place as Prescribed: Yes Pain Present Now: No Electronic Signature(s) Signed: 06/04/2020 5:56:30 PM By: Levan Hurst RN, BSN Entered By: Levan Hurst on 05/31/2020 10:08:21 -------------------------------------------------------------------------------- Encounter Discharge Information Details Patient Name: Date of Service: [name] 05/31/2020 8:00 A M Medical Record Number: [id number] Patient Account Number: [id number] Date of Birth/Sex: Treating RN: [birth year] (50 y.o. [name] Primary Care Milind Raether: [name] Other Clinician: Referring Gillie Fleites: Treating Jin Shockley/Extender: [name] in Treatment: 7 Encounter Discharge Information Items Discharge Condition:  Stable Ambulatory Status: Ambulatory Discharge Destination: Home Transportation: Private Auto Accompanied By: alone Schedule Follow-up Appointment: Yes Clinical Summary of Care: Patient Declined Electronic Signature(s) Signed: 06/04/2020 5:56:30 PM By: Levan Hurst RN, BSN Entered By: Levan Hurst on 05/31/2020 13:07:17 -------------------------------------------------------------------------------- Vitals Details Patient Name: Date of Service: [name] 05/31/2020 8:00 A M Medical Record Number: [id number] Patient Account Number: [id number] Date of Birth/Sex: Treating RN: 10-16-1970 (50 y.o. [name] Primary Care Adelheid Hoggard: [name] Other Clinician: Referring Shalin Linders: Treating Albertia Carvin/Extender: [name] in Treatment: 7 Vital Signs Time Taken: 07:51 Temperature (F): 97.8 Height (in): 67 Pulse (bpm): 99 Weight (lbs): 257 Respiratory Rate (breaths/min): 18 Body Mass Index (BMI): 40.2 Blood Pressure (mmHg): 140/95 Reference Range: 80 - 120 mg / dl Electronic Signature(s) Signed: 06/04/2020 5:56:30 PM By: Levan Hurst RN, BSN Entered By: Levan Hurst on 05/31/2020 10:08:42

## 2020-06-04 NOTE — Progress Notes (Signed)
[name], [name] ([id number]) Visit Report for 06/04/2020 Arrival Information Details Patient Name: Date of Service: Sarah [name] [name] 06/04/2020 8:00 Sarah M Medical Record Number: [id number] Patient Account Number: [id number] Date of Birth/Sex: Treating RN: [birth year] (50 y.o. [name] Primary Care Benjimin Hadden: [name] Other Clinician: Referring Kaidan Harpster: Treating Arlis Yale/Extender: [name] in Treatment: 8 Visit Information History Since Last Visit Added or deleted any medications: No Patient Arrived: Ambulatory Any new allergies or adverse reactions: No Arrival Time: 07:50 Had Sarah fall or experienced change in No Accompanied By: self activities of daily living that may affect Transfer Assistance: None risk of falls: Patient Identification Verified: Yes Signs or symptoms of abuse/neglect since last visito No Secondary Verification Process Completed: Yes Hospitalized since last visit: No Patient Requires Transmission-Based Precautions: No Implantable device outside of the clinic excluding No Patient Has Alerts: No cellular tissue based products placed in the center since last visit: Has Dressing in Place as Prescribed: Yes Pain Present Now: No Notes Wound Care encounter before hyberbaric treatment. Electronic Signature(s) Signed: 06/04/2020 5:34:16 PM By: Deon Pilling Entered By: Deon Pilling on 06/04/2020 11:04:50 -------------------------------------------------------------------------------- Encounter Discharge Information Details Patient Name: Date of Service: Sarah Crochet V [name] 06/04/2020 8:00 Sarah M Medical Record Number: [id number] Patient Account Number: [id number] Date of Birth/Sex: Treating RN: 01/15/1970 (50 y.o. [name] Primary Care Cherina Dhillon: [name] Other Clinician: Referring Mazikeen Hehn: Treating Bradleigh Sonnen/Extender: [name] in Treatment: 8 Encounter Discharge  Information Items Discharge Condition: Stable Ambulatory Status: Ambulatory Discharge Destination: Home Transportation: Private Auto Accompanied By: self Schedule Follow-up Appointment: Yes Clinical Summary of Care: Electronic Signature(s) Signed: 06/04/2020 5:34:16 PM By: Deon Pilling Entered By: Deon Pilling on 06/04/2020 11:06:40 -------------------------------------------------------------------------------- Vitals Details Patient Name: Date of Service: Sarah Crochet V [name] 06/04/2020 8:00 Sarah M Medical Record Number: [id number] Patient Account Number: [id number] Date of Birth/Sex: Treating RN: [birth year] (50 y.o. [name], [name] Primary Care Ashden Sonnenberg: [name] Other Clinician: Referring Lear Carstens: Treating Melonee Gerstel/Extender: [name] in Treatment: 8 Vital Signs Time Taken: 07:50 Temperature (F): 98.3 Height (in): 67 Pulse (bpm): 74 Weight (lbs): 257 Respiratory Rate (breaths/min): 18 Body Mass Index (BMI): 40.2 Blood Pressure (mmHg): 128/79 Reference Range: 80 - 120 mg / dl Electronic Signature(s) Signed: 06/04/2020 5:34:16 PM By: Deon Pilling Entered By: Deon Pilling on 06/04/2020 11:05:01

## 2020-06-04 NOTE — Progress Notes (Signed)
[name], [name] ([id number]) Visit Report for 06/04/2020 HBO Details Patient Name: Date of Service: [name] 06/04/2020 8:00 A M Medical Record Number: [id number] Patient Account Number: [id number] Date of Birth/Sex: Treating RN: 22-Oct-1970 (50 y.o. 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 HBO Treatment Details Treatment Number: 34 Patient Type: Outpatient Chamber Type: Monoplace Chamber Serial #: [id number] Treatment Protocol: 2.5 ATA with 90 minutes oxygen, with two 5 minute air breaks Treatment Details Compression Rate Down: 2.0 psi / minute De-Compression Rate Up: 2.0 psi / minute A breaks and breathing ir Compress Tx Pressure periods Decompress Decompress Begins Reached (leave unused spaces Begins Ends blank) Chamber Pressure (ATA 1 2.5 2.5 2.5 2.5 2.5 - - 2.5 1 ) Clock Time (24 hr) 08:39 08:51 09:21 09:26 09:56 10:01 - - 10:31 10:41 Treatment Length: 122 (minutes) Treatment Segments: 4 Vital Signs Capillary Blood Glucose Reference Range: 80 - 120 mg / dl HBO Diabetic Blood Glucose Intervention Range: <131 mg/dl or >249 mg/dl Time Vitals Blood Respiratory Capillary Blood Glucose Pulse Action Type: Pulse: Temperature: Taken: Pressure: Rate: Glucose (mg/dl): Meter #: Oximetry (%) Taken: Pre 07:50 128/79 74 18 98.3 Post 10:43 122/96 76 16 97.7 Treatment Response Treatment Toleration: Well Treatment Completion Status: Treatment Completed without Adverse Event [name] Notes No concerns with treatment given Physician HBO Attestation: I certify that I supervised this HBO treatment in accordance with  Medicare guidelines. A trained emergency response team is readily available per Yes hospital policies and procedures. Continue HBOT as ordered. Yes Electronic Signature(s) Signed: 06/04/2020 5:02:17 PM By: Sarah Ham MD Entered By: [name] on 06/04/2020 13:16:22 -------------------------------------------------------------------------------- HBO Safety Checklist Details Patient Name: Date of Service: [name] V [name] 06/04/2020 8:00 A M Medical Record Number: [id number] Patient Account Number: [id number] Date of Birth/Sex: Treating RN: 1970/11/07 (50 y.o. [name], [name] Primary Care Andrey Hoobler: [name] Other Clinician: Referring Thea Holshouser: Treating Ahmod Gillespie/Extender: [name] in Treatment: 8 HBO Safety Checklist Items Safety Checklist Consent Form Signed Patient voided / foley secured and emptied When did you last eato this morning Last dose of injectable or oral agent crackers Ostomy pouch emptied and vented if applicable NA All implantable devices assessed, documented and approved NA Intravenous access site secured and place NA Valuables secured Linens and cotton and cotton/polyester blend (less than 51% polyester) Personal oil-based products / skin lotions / body lotions removed NA Wigs or hairpieces removed NA Smoking or tobacco materials removed NA Books / newspapers / magazines / loose paper removed NA Cologne, aftershave, perfume and deodorant removed NA Jewelry removed (may wrap wedding band) Make-up removed NA Hair care products removed NA Battery operated devices (external) removed NA Heating patches and chemical warmers removed NA Titanium eyewear removed Nail polish cured greater than 10 hours Casting material cured greater than 10 hours NA Hearing aids removed NA Loose dentures or partials removed Prosthetics have been removed NA Patient demonstrates correct use of air break device (if  applicable) Patient concerns have been addressed Patient grounding bracelet on and cord attached to chamber Specifics for Inpatients (complete in addition to above) Medication sheet sent with patient Intravenous medications needed or due during therapy sent with patient Drainage tubes (  e.g. nasogastric tube or chest tube secured and vented) Endotracheal or Tracheotomy tube secured Cuff deflated of air and inflated with saline Airway suctioned Electronic Signature(s) Signed: 06/04/2020 5:34:16 PM By: Deon Pilling Entered By: Deon Pilling on 06/04/2020 11:05:28

## 2020-06-04 NOTE — Progress Notes (Addendum)
[name], [name] ([id number]) Visit Report for 06/04/2020 HPI Details Patient Name: Date of Service: [name] [name] 06/04/2020 7:30 Sarah M Medical Record Number: [id number] Patient Account Number: [id number] Date of Birth/Sex: Treating RN: [name] (50 y.o. [name], [name] Primary Care Provider: [name] Other Clinician: Referring Provider: Treating Provider/Extender: [name] in Treatment: 8 History of Present Illness HPI Description: ADMISSION 04/09/2020 [name] 50 year old woman who is history of breast cancer in the left breast goes back to 2018. He was found to have Sarah mass in her left breast. [name] and then subsequent chemo that she did not tolerate very well. She had seed implants placed but then underwent external beam Sarah radiation in the spring 2019. 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 Her adjunctive radiation was from 03/11/2017 through 04/21/2017. Unfortunately she had Sarah left breast biopsy on 11/24/2019 and it was again positive for an invasive ductal carcinoma grade 3 he is to resume at receptor 80% positive. HER-2 negative progesterone negative. She opted for bilateral mastectomies which were done on 01/09/2020 on the right with no evidence of malignancy on the left stage IIb invasive ductal carcinoma. She again received chemotherapy but she did not tolerate it because of wound dehiscence and low platelet counts according to the patient. Unfortunately her mastectomy wound dehisced on the left breast. She was taken back to the OR by [name] of general surgery on 33/24/22 and she underwent Sarah debridement and subsequent attempt at closure. She had been previously treated with Augmentin for suspected underlying infection. Once again the wound has dehisced in the mid  aspect. It is roughly 1 cm deep there is tunnels both medially and laterally she. She still has sutures in place. Still describes constant pain drainage. She also has Sarah drain still in place that drains about 10 cc/day. Currently she is changing ABD pad several times Sarah day 4/12; patient went back to see [name] who remove most of the stitches and debrided her wound. She has been using silver alginate culture I did last time which was Sarah PCR culture showed Peptostreptococcus, Enterococcus faecalis and Staphylococcus epidermidis which is of questionable significance. [name] that the sutures have been removed. There are undermining areas superior medial superior lateral and Sarah deep tunneling area in the inferior part of the wound. There is no obvious purulence. I started her on Augmentin on 04/12/2020 and I will likely extend this for another week AMAZINGLY; she is already been approved for HBO and will start today. [name] Echocardiogram which I reviewed from 2019 showed Sarah normal ejection fraction.. I went over side effects of HBO with the patient and answered any of her questions. Her husband was present 5/3; [name]. She went to see [name] about 2-1/2 weeks ago according to the patient he can remove some of the surrounding debris. She will see him again later this week or early next week. She has been using silver alginate. Sarah lot of this is problematic since she has Medicaid which traditionally does not cover wound care supplies although it traditionally does not cover HBO either Her wound actually looks quite Sarah bit better than the last time I saw this better looking surface healthy looking  tissue. She has tunnels at 810 2 in 6 the deepest of these is at 2 at 2.2 cm there is no exposed bone. 5/10; the patient was coming in for nurse visit today but requested that I see the wound because of surface discoloration. She  states the Iodoflex makes the surface of this turned "white". She is not really complaining of undue tenderness. She is changing this every second day. The goal of this had been to get the surface of the wound to Sarah vibrant red color and then potentially look at changing to Sarah silver collagen-based dressing, Puraply, and then perhaps an alternative skin substituteo Derma vest. The patient tells me she has an appoint with her oncologist [name] on the 19th. She apparently think she is going to get another PET scan. Based on this I would like his comment about whether there is possibility of any malignancy in the wound itself or just underneath it. Up to now we have not really thought that was true 5/17; nice improvement in the chest wound. Less depth. We have been using Iodoflex to clean off the wound surface this seems to have done nicely. Patient went to see [name] 2 days ago who has referred her to see Dr. Claudia Desanctis. I certainly agree with the thought the surface of the wound looks Sarah lot better now and I think consideration of plastic surgery is certainly something the patient should discuss. I have talked to the patient about this today and I certainly support Sarah conversation with Dr. Claudia Desanctis. I also wondered about is there any residual cancer in this. [name] apparently did not seem to think so per the patient although I do not see that in the text conversations we have had. I also wonder about Sarah skin substitute if her version of Medicare [BCCP] would cover this. [name] is apparently considering starting her on [place]. Patient is already very scared about the side effects but she will discuss this with [name] on 5/19 5/31; patient's wound about the same in terms of size she still has the tunneling area to the left. Surface of the wound looks Sarah lot better there has been some filling in the granulation I have been included on serial text between Dr. Callie Fielding surgery]  Dr. Ruffin Frederick oncology] and Dr. Para Skeans surgery]. I think there is not Sarah lot of thought that the patient has recurrence of her cancer her MRI was indeterminant but [name] did not feel that there was any reason to think she has Sarah major recurrence and he was not in favor of doing Sarah dedicated breast MRI. Dr. Claudia Desanctis wants to go ahead with Sarah flap closure I did it. I have not reviewed his note but per the patient [name] latissimus dorsi myocutaneous flap or I think Sarah skin flap from her abdomen. I have talked to the patient I think it is time to attempt this. If I can continue hyperbarics surrounding the flap then we will attempt to do this Electronic Signature(s) Signed: 06/04/2020 5:02:17 PM By: [name] MD Entered By: [name] on 06/04/2020 08:58:44 -------------------------------------------------------------------------------- Physical Exam Details Patient Name: Date of Service: [name] V [name] 06/04/2020 7:30 Sarah M Medical Record Number: [id number] Patient Account Number: [id number] Date of Birth/Sex: Treating RN: [birth year] (50 y.o. Benjaman Lobe Primary Care Provider: [name] Other Clinician: Referring Provider: Treating Provider/Extender: [name] in Treatment: 8 Constitutional Sitting or standing  Blood Pressure is within target range for patient.. Pulse regular and within target range for patient.Marland Kitchen Respirations regular, non-labored and within target range.. Temperature is normal and within the target range for the patient.Marland Kitchen Appears in no distress. Notes Wound exam; the tissue has improved. Looks healthy bleeds nicely there is less adherent debris on the surface. She has what looks to be some viable granulation. Certainly did not have that when she is started here. Other than that no major changes in the dimensions. I did not think that debridement was necessary. Electronic Signature(s) Signed: 06/04/2020  5:02:17 PM By: [name] MD Entered By: [name] on 06/04/2020 08:59:55 -------------------------------------------------------------------------------- Physician Orders Details Patient Name: Date of Service: [name] V [name] 06/04/2020 7:30 Sarah M Medical Record Number: [id number] Patient Account Number: [id number] Date of Birth/Sex: Treating RN: [name] (50 y.o. [name], [name] Primary Care Provider: [name] Other Clinician: Referring Provider: Treating Provider/Extender: [name] in Treatment: 8 Verbal / Phone Orders: No Diagnosis Coding Follow-up Appointments ppointment in 2 weeks. - Dr. Dellia Nims Return Sarah Nurse Visit: - 1 week Other: - Pt. will call Dr. Claudia Desanctis about scheduling skin flap surgery Bathing/ Shower/ Hygiene Sarah shower with protection but do not get wound dressing(s) wet. Additional Orders / Instructions Follow Nutritious Diet Hyperbaric Oxygen Therapy Evaluate for HBO Therapy - Evaluate for extending HBO therapy Indication: - soft tissue radionecrosis 2.5 ATA for 90 Minutes with 2 Five (5) Minute Sarah Breaks ir Total Number of Treatments: - 40 One treatments per day (delivered Monday through Friday unless otherwise specified in Special Instructions below): Afrin (Oxymetazoline HCL) 0.05% nasal spray - 1 spray in both nostrils daily as needed prior to HBO treatment for difficulty clearing ears Wound Treatment Wound #1 - Chest Wound Laterality: Left Cleanser: Normal Saline (Generic) 1 x Per Day/15 Days Discharge Instructions: Cleanse the wound with Normal Saline prior to applying Sarah clean dressing using gauze sponges, not tissue or cotton balls. Prim Dressing: Promogran Prisma Matrix, 4.34 (sq in) (silver collagen) 1 x Per Day/15 Days ary Discharge Instructions: Moisten collagen with saline or hydrogel Secondary Dressing: Zetuvit Plus 4x4 in (Generic) 1 x Per Day/15 Days Discharge Instructions: Apply over  primary dressing as directed. Add-Ons: Byram (Generic) 1 x Per Day/15 Days Add-Ons: 1 x Per RVU/02 Days Electronic Signature(s) Signed: 06/04/2020 5:02:17 PM By: [name] MD Signed: 06/04/2020 5:31:54 PM By: Rhae Hammock RN Entered By: Rhae Hammock on 06/04/2020 08:22:33 -------------------------------------------------------------------------------- Problem List Details Patient Name: Date of Service: [name] V [name] 06/04/2020 7:30 Sarah M Medical Record Number: [id number] Patient Account Number: [id number] Date of Birth/Sex: Treating RN: [birth year][name] (50 y.o. 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 04/09/2020 No Yes T81.31XD Disruption of external operation (surgical) wound, not elsewhere classified, 04/09/2020 No Yes subsequent encounter 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 and subcutaneous tissue related to 04/09/2020 No Yes radiation Z90.10 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 04/09/2020 No Yes Inactive Problems Resolved Problems Electronic Signature(s) Signed: 06/04/2020 5:02:17 PM By: [name] MD Entered By: [name] on 06/04/2020 08:56:29 -------------------------------------------------------------------------------- Progress Note Details Patient Name: Date of Service: [name] V [name] 06/04/2020 7:30 Sarah M Medical Record Number: [id number] Patient Account Number: [id number] Date of Birth/Sex:  Treating RN: [name], [name] (51 y.o. Benjaman Lobe Primary Care Provider: Other Clinician: [name] Referring Provider: Treating Provider/Extender: [name] in Treatment: 8 Subjective History of Present Illness  (HPI) ADMISSION 04/09/2020 [name] 50 year old woman who is history of breast cancer in the left breast goes back to 2018. He was found to have Sarah mass in her left breast. [name] and then subsequent chemo that she did not tolerate very well. She had seed implants placed but then underwent external beam Sarah radiation in the spring 2019. 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 Her adjunctive radiation was from 03/11/2017 through 04/21/2017. Unfortunately she had Sarah left breast biopsy on 11/24/2019 and it was again positive for an invasive ductal carcinoma grade 3 he is to resume at receptor 80% positive. HER-2 negative progesterone negative. She opted for bilateral mastectomies which were done on 01/09/2020 on the right with no evidence of malignancy on the left stage IIb invasive ductal carcinoma. She again received chemotherapy but she did not tolerate it because of wound dehiscence and low platelet counts according to the patient. Unfortunately her mastectomy wound dehisced on the left breast. She was taken back to the OR by [name] of general surgery on 33/24/22 and she underwent Sarah debridement and subsequent attempt at closure. She had been previously treated with Augmentin for suspected underlying infection. Once again the wound has dehisced in the mid aspect. It is roughly 1 cm deep there is tunnels both medially and laterally she. She still has sutures in place. Still describes constant pain drainage. She also has Sarah drain still in place that drains about 10 cc/day. Currently she is changing ABD pad several times Sarah day 4/12; patient went back to see [name] who remove most of the stitches and debrided her wound. She has been using silver alginate culture I did last time which was Sarah PCR culture showed Peptostreptococcus,  Enterococcus faecalis and Staphylococcus epidermidis which is of questionable significance. [name] that the sutures have been removed. There are undermining areas superior medial superior lateral and Sarah deep tunneling area in the inferior part of the wound. There is no obvious purulence. I started her on Augmentin on 04/12/2020 and I will likely extend this for another week AMAZINGLY; she is already been approved for HBO and will start today. [name] Echocardiogram which I reviewed from 2019 showed Sarah normal ejection fraction.. I went over side effects of HBO with the patient and answered any of her questions. Her husband was present 5/3; [name]. She went to see [name] about 2-1/2 weeks ago according to the patient he can remove some of the surrounding debris. She will see him again later this week or early next week. She has been using silver alginate. Sarah lot of this is problematic since she has Medicaid which traditionally does not cover wound care supplies although it traditionally does not cover HBO either Her wound actually looks quite Sarah bit better than the last time I saw this better looking surface healthy looking tissue. She has tunnels at 810 2 in 6 the deepest of these is at 2 at 2.2 cm there is no exposed bone. 5/10; the patient was coming in for nurse visit today but requested that  I see the wound because of surface discoloration. She states the Iodoflex makes the surface of this turned "white". She is not really complaining of undue tenderness. She is changing this every second day. The goal of this had been to get the surface of the wound to Sarah vibrant red color and then potentially look at changing to Sarah silver collagen-based dressing, Puraply, and then perhaps an alternative skin substituteo Derma vest. The patient tells me she has an appoint with her oncologist [name] on the 19th. She apparently think  she is going to get another PET scan. Based on this I would like his comment about whether there is possibility of any malignancy in the wound itself or just underneath it. Up to now we have not really thought that was true 5/17; nice improvement in the chest wound. Less depth. We have been using Iodoflex to clean off the wound surface this seems to have done nicely. Patient went to see [name] 2 days ago who has referred her to see Dr. Claudia Desanctis. I certainly agree with the thought the surface of the wound looks Sarah lot better now and I think consideration of plastic surgery is certainly something the patient should discuss. I have talked to the patient about this today and I certainly support Sarah conversation with Dr. Claudia Desanctis. I also wondered about is there any residual cancer in this. [name] apparently did not seem to think so per the patient although I do not see that in the text conversations we have had. I also wonder about Sarah skin substitute if her version of Medicare [BCCP] would cover this. [name] is apparently considering starting her on [place]. Patient is already very scared about the side effects but she will discuss this with [name] on 5/19 5/31; patient's wound about the same in terms of size she still has the tunneling area to the left. Surface of the wound looks Sarah lot better there has been some filling in the granulation I have been included on serial text between Dr. Callie Fielding surgery] Dr. Ruffin Frederick oncology] and Dr. Para Skeans surgery]. I think there is not Sarah lot of thought that the patient has recurrence of her cancer her MRI was indeterminant but [name] did not feel that there was any reason to think she has Sarah major recurrence and he was not in favor of doing Sarah dedicated breast MRI. Dr. Claudia Desanctis wants to go ahead with Sarah flap closure I did it. I have not reviewed his note but per the patient [name] latissimus dorsi myocutaneous flap or I think Sarah  skin flap from her abdomen. I have talked to the patient I think it is time to attempt this. If I can continue hyperbarics surrounding the flap then we will attempt to do this Objective Constitutional Sitting or standing Blood Pressure is within target range for patient.. Pulse regular and within target range for patient.Marland Kitchen Respirations regular, non-labored and within target range.. Temperature is normal and within the target range for the patient.Marland Kitchen Appears in no distress. Vitals Time Taken: 7:50 AM, Height: 67 in, Weight: 257 lbs, BMI: 40.2, Temperature: 98.3 F, Pulse: 74 bpm, Respiratory Rate: 18 breaths/min, Blood Pressure: 128/79 mmHg. General Notes: Wound exam; the tissue has improved. Looks healthy bleeds nicely there is less adherent debris on the surface. She has what looks to be some viable granulation. Certainly did not have that when she is started here. Other than that no major changes in the  dimensions. I did not think that debridement was necessary. Integumentary (Hair, Skin) Wound #1 status is Open. Original cause of wound was Surgical Injury. The date acquired was: 01/16/2020. The wound has been in treatment 8 weeks. The wound is located on the Left Chest. The wound measures 5cm length x 4.5cm width x 1.8cm depth; 17.671cm^2 area and 31.809cm^3 volume. There is Fat Layer (Subcutaneous Tissue) exposed. There is no undermining noted, however, there is tunneling at 11:00 with Sarah maximum distance of 1.5cm. There is additional tunneling at 2:00 with Sarah maximum distance of 2cm, and at 8:00 with Sarah maximum distance of 2.5cm. There is Sarah medium amount of serosanguineous drainage noted. The wound margin is well defined and not attached to the wound base. There is medium (34-66%) red granulation within the wound bed. There is Sarah medium (34-66%) amount of necrotic tissue within the wound bed including Adherent Slough. Assessment Active Problems ICD-10 Intraductal carcinoma in situ of left  breast Disruption of external operation (surgical) wound, not elsewhere classified, subsequent encounter 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 Plan Follow-up Appointments: Return Appointment in 2 weeks. - Dr. Dellia Nims Nurse Visit: - 1 week Other: - Pt. will call Dr. Claudia Desanctis about scheduling skin flap surgery Bathing/ Shower/ Hygiene: Sarah shower with protection but do not get wound dressing(s) wet. Additional Orders / Instructions: Follow Nutritious Diet Hyperbaric Oxygen Therapy: Evaluate for HBO Therapy - Evaluate for extending HBO therapy Indication: - soft tissue radionecrosis 2.5 ATA for 90 Minutes with 2 Five (5) Minute Air Breaks T Number of Treatments: - 40 otal One treatments per day (delivered Monday through Friday unless otherwise specified in Special Instructions below): Afrin (Oxymetazoline HCL) 0.05% nasal spray - 1 spray in both nostrils daily as needed prior to HBO treatment for difficulty clearing ears WOUND #1: - Chest Wound Laterality: Left Cleanser: Normal Saline (Generic) 1 x Per Day/15 Days Discharge Instructions: Cleanse the wound with Normal Saline prior to applying Sarah clean dressing using gauze sponges, not tissue or cotton balls. Prim Dressing: Promogran Prisma Matrix, 4.34 (sq in) (silver collagen) 1 x Per Day/15 Days ary Discharge Instructions: Moisten collagen with saline or hydrogel Secondary Dressing: Zetuvit Plus 4x4 in (Generic) 1 x Per Day/15 Days Discharge Instructions: Apply over primary dressing as directed. Add-Ons: Byram (Generic) 1 x Per Day/15 Days Add-Ons: 1 x Per Day/15 Days 1. I am changing the dressing to silver collagen from Iodoflex. Hopefully to stimulate some additional granulation 2. I will post my support in the provider blog that I am involved with an secure text messaging. I think if Dr. Claudia Desanctis is ready to attempt this I certainly will support  it 3. I will attempt to continue hyperbarics surrounding the graft. Either with or without Sarah interruption of treatment of course Engineer, production) Signed: 06/04/2020 5:02:17 PM By: [name] MD Entered By: [name] on 06/04/2020 09:02:41 -------------------------------------------------------------------------------- SuperBill Details Patient Name: Date of Service: [name] [name] 06/04/2020 Medical Record Number: [id number] Patient Account Number: [id number] Date of Birth/Sex: Treating RN: [birth year] (50 y.o. [name], [name] Primary Care Provider: [name] Other Clinician: Referring Provider: Treating Provider/Extender: [name] in Treatment: 8 Diagnosis Coding ICD-10 Codes Code Description [phone] Intraductal carcinoma in situ of left breast T81.31XD Disruption of external operation (surgical) wound, not elsewhere classified, subsequent encounter 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 and subcutaneous tissue related to  radiation Z90.10 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 Facility Procedures CPT4 Code: [id number] Description: Rachel VISIT-LEV 3 EST PT Modifier: Quantity: 1 Electronic Signature(s) Signed: 06/06/2020 12:00:56 PM By: Rhae Hammock RN Signed: 06/06/2020 4:50:31 PM By: [name] MD Previous Signature: 06/04/2020 5:02:17 PM Version By: [name] MD Entered By: Rhae Hammock on 06/05/2020 14:35:Khan

## 2020-06-04 NOTE — Progress Notes (Signed)
MIRTIE, BASTYR ([id number]) Visit Report for 06/04/2020 SuperBill Details Patient Name: Date of Service: [name] 06/04/2020 Medical Record Number: [id number] Patient Account Number: [id number] Date of Birth/Sex: Treating RN: [birth year] (50 y.o. 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 Disruption of external operation (surgical) wound, not elsewhere classified, subsequent encounter 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 Facility Procedures CPT4 Code Description Modifier Quantity [id number]-(Facility Use Only) HBOT full body chamber, 80min , 4 Physician Procedures Quantity CPT4 Code Description Modifier [id number] 21624 - WC PHYS HYPERBARIC OXYGEN THERAPY 1 ICD-10 Diagnosis Description 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 Electronic Signature(s) Signed: 06/04/2020 5:02:17 PM By: [name] MD Signed: 06/04/2020 5:34:16 PM By: Deon Pilling Entered By: Deon Pilling on 06/04/2020 11:06:23

## 2020-06-04 NOTE — Progress Notes (Addendum)
[name], [name] ([id number]) Visit Report for 06/04/2020 Arrival Information Details Patient Name: Date of Service: [name] [name] 06/04/2020 7:30 Sarah M Medical Record Number: [id number] Patient Account Number: [id number] Date of Birth/Sex: Treating RN: 1970-08-26 (50 y.o. F) Primary Care Katerina Zurn: [name] Other Clinician: Referring Day Greb: Treating Railynn Ballo/Extender: Alverda Skeans in Treatment: 8 Visit Information History Since Last Visit Added or deleted any medications: No Patient Arrived: Ambulatory Any new allergies or adverse reactions: No Arrival Time: 07:49 Had Sarah fall or experienced change in No Accompanied By: self activities of daily living that may affect Transfer Assistance: None risk of falls: Patient Identification Verified: Yes Signs or symptoms of abuse/neglect since last visito No Secondary Verification Process Completed: Yes Hospitalized since last visit: No Patient Requires Transmission-Based Precautions: No Implantable device outside of the clinic excluding No Patient Has Alerts: No cellular tissue based products placed in the center since last visit: Has Dressing in Place as Prescribed: Yes Pain Present Now: No Electronic Signature(s) Signed: 06/04/2020 5:24:49 PM By: Sandre Kitty Entered By: Sandre Kitty on 06/04/2020 07:50:03 -------------------------------------------------------------------------------- Clinic Level of Care Assessment Details Patient Name: Date of Service: [name] [name] 06/04/2020 7:30 Sarah M Medical Record Number: [id number] Patient Account Number: [id number] Date of Birth/Sex: Treating RN: [birth year] (50 y.o. [name], [name] Primary Care Mahaila Tischer: [name] Other Clinician: Referring Chrsitopher Wik: Treating Teresita Fanton/Extender: [name] in Treatment: 8 Clinic Level of Care Assessment Items TOOL 4 Quantity Score X- 1 0 Use when only an EandM is performed  on FOLLOW-UP visit ASSESSMENTS - Nursing Assessment / Reassessment X- 1 10 Reassessment of Co-morbidities (includes updates in patient status) X- 1 5 Reassessment of Adherence to Treatment Plan ASSESSMENTS - Wound and Skin Sarah ssessment / Reassessment X - Simple Wound Assessment / Reassessment - one wound 1 5 []  - 0 Complex Wound Assessment / Reassessment - multiple wounds X- 1 10 Dermatologic / Skin Assessment (not related to wound area) ASSESSMENTS - Focused Assessment []  - 0 Circumferential Edema Measurements - multi extremities []  - 0 Nutritional Assessment / Counseling / Intervention []  - 0 Lower Extremity Assessment (monofilament, tuning fork, pulses) []  - 0 Peripheral Arterial Disease Assessment (using hand held doppler) ASSESSMENTS - Ostomy and/or Continence Assessment and Care []  - 0 Incontinence Assessment and Management []  - 0 Ostomy Care Assessment and Management (repouching, etc.) PROCESS - Coordination of Care X - Simple Patient / Family Education for ongoing care 1 15 []  - 0 Complex (extensive) Patient / Family Education for ongoing care X- 1 10 Staff obtains Programmer, systems, Records, T Results / Process Orders est []  - 0 Staff telephones HHA, Nursing Homes / Clarify orders / etc []  - 0 Routine Transfer to another Facility (non-emergent condition) []  - 0 Routine Hospital Admission (non-emergent condition) []  - 0 New Admissions / Biomedical engineer / Ordering NPWT Apligraf, etc. , []  - 0 Emergency Hospital Admission (emergent condition) X- 1 10 Simple Discharge Coordination []  - 0 Complex (extensive) Discharge Coordination PROCESS - Special Needs []  - 0 Pediatric / Minor Patient Management []  - 0 Isolation Patient Management []  - 0 Hearing / Language / Visual special needs []  - 0 Assessment of Community assistance (transportation, D/C planning, etc.) []  - 0 Additional assistance / Altered mentation []  - 0 Support Surface(s) Assessment (bed,  cushion, seat, etc.) INTERVENTIONS - Wound Cleansing / Measurement X - Simple Wound Cleansing - one wound 1 5 []  - 0 Complex Wound  Cleansing - multiple wounds X- 1 5 Wound Imaging (photographs - any number of wounds) []  - 0 Wound Tracing (instead of photographs) X- 1 5 Simple Wound Measurement - one wound []  - 0 Complex Wound Measurement - multiple wounds INTERVENTIONS - Wound Dressings X - Small Wound Dressing one or multiple wounds 1 10 []  - 0 Medium Wound Dressing one or multiple wounds []  - 0 Large Wound Dressing one or multiple wounds []  - 0 Application of Medications - topical []  - 0 Application of Medications - injection INTERVENTIONS - Miscellaneous []  - 0 External ear exam []  - 0 Specimen Collection (cultures, biopsies, blood, body fluids, etc.) []  - 0 Specimen(s) / Culture(s) sent or taken to Lab for analysis []  - 0 Patient Transfer (multiple staff / Civil Service fast streamer / Similar devices) []  - 0 Simple Staple / Suture removal (25 or less) []  - 0 Complex Staple / Suture removal (26 or more) []  - 0 Hypo / Hyperglycemic Management (close monitor of Blood Glucose) []  - 0 Ankle / Brachial Index (ABI) - do not check if billed separately X- 1 5 Vital Signs Has the patient been seen at the hospital within the last three years: Yes Total Score: 95 Level Of Care: New/Established - Level 3 Electronic Signature(s) Signed: 06/06/2020 12:00:56 PM By: Rhae Hammock RN Entered By: Rhae Hammock on 06/05/2020 14:35:16 -------------------------------------------------------------------------------- Lower Extremity Assessment Details Patient Name: Date of Service: [name] V [name] 06/04/2020 7:30 Sarah M Medical Record Number: [id number] Patient Account Number: [id number] Date of Birth/Sex: Treating RN: 11/04/1970 (50 y.o. [name] Primary Care Oshen Wlodarczyk: [name] Other Clinician: Referring Catelin Manthe: Treating Jori Frerichs/Extender: [name] in Treatment: 8 Electronic Signature(s) Signed: 06/04/2020 5:28:21 PM By: Lorrin Jackson Entered By: Lorrin Jackson on 06/04/2020 07:55:26 -------------------------------------------------------------------------------- Multi Wound Chart Details Patient Name: Date of Service: [name] V [name] 06/04/2020 7:30 Sarah M Medical Record Number: [id number] Patient Account Number: [id number] Date of Birth/Sex: Treating RN: 1970/08/23 (50 y.o. [name], [name] Primary Care Tienna Bienkowski: [name] Other Clinician: Referring Devri Kreher: Treating Catherin Doorn/Extender: [name] in Treatment: 8 Vital Signs Height(in): 67 Pulse(bpm): 74 Weight(lbs): 539 Blood Pressure(mmHg): 128/79 Body Mass Index(BMI): 40 Temperature(F): 98.3 Respiratory Rate(breaths/min): 18 Photos: [1:No Photos Left Chest] [N/Sarah:N/Sarah N/Sarah] Wound Location: [1:Surgical Injury] [N/Sarah:N/Sarah] Wounding Event: [1:Open Surgical Wound] [N/Sarah:N/Sarah] Primary Etiology: [1:Necrosis (Radiation)] [N/Sarah:N/Sarah] Secondary Etiology: [1:Hypertension, Osteoarthritis, ReceivedN/Sarah] Comorbid History: [1:Chemotherapy, Received Radiation 01/16/2020] [N/Sarah:N/Sarah] Date Acquired: [1:8] [N/Sarah:N/Sarah] Weeks of Treatment: [1:Open] [N/Sarah:N/Sarah] Wound Status: [1:5x4.5x1.8] [N/Sarah:N/Sarah] Measurements L x W x D (cm) [1:17.671] [N/Sarah:N/Sarah] Sarah (cm) : rea [1:31.809] [N/Sarah:N/Sarah] Volume (cm) : [1:-177.80%] [N/Sarah:N/Sarah] % Reduction in Sarah rea: [1:-354.50%] [N/Sarah:N/Sarah] % Reduction in Volume: [1:11] Position 1 (o'clock): [1:1.5] Maximum Distance 1 (cm): [1:2] Position 2 (o'clock): [1:2] Maximum Distance 2 (cm): [1:8] Position 3 (o'clock): [1:2.5] Maximum Distance 3 (cm): [1:Yes] [N/Sarah:N/Sarah] Tunneling: [1:Full Thickness Without Exposed] [N/Sarah:N/Sarah] Classification: [1:Support Structures Medium] [N/Sarah:N/Sarah] Exudate Amount: [1:Serosanguineous] [N/Sarah:N/Sarah] Exudate Type: [1:red, brown] [N/Sarah:N/Sarah] Exudate Color: [1:Well defined, not attached] [N/Sarah:N/Sarah] Wound  Margin: [1:Medium (34-66%)] [N/Sarah:N/Sarah] Granulation Amount: [1:Red] [N/Sarah:N/Sarah] Granulation Quality: [1:Medium (34-66%)] [N/Sarah:N/Sarah] Necrotic Amount: [1:Fat Layer (Subcutaneous Tissue): Yes N/Sarah] Exposed Structures: [1:Fascia: No Tendon: No Muscle: No Joint: No Bone: No] Treatment Notes Electronic Signature(s) Signed: 06/04/2020 5:02:17 PM By: [name] MD Signed: 06/04/2020 5:31:54 PM By: Rhae Hammock RN Entered By: [name] on 06/04/2020 08:56:36 -------------------------------------------------------------------------------- Multi-Disciplinary Care Plan Details Patient Name: Date of Service: Sarah BDELA Sarah  L, Wonda Amis [name] 06/04/2020 7:30 Sarah M Medical Record Number: [id number] Patient Account Number: [id number] Date of Birth/Sex: Treating RN: [birth year] (50 y.o. [name], [name] Primary Care Alaura Schippers: [name] Other Clinician: Referring Derward Marple: Treating Quenisha Lovins/Extender: [name] in Treatment: 8 Active Inactive Wound/Skin Impairment Nursing Diagnoses: Impaired tissue integrity Knowledge deficit related to smoking impact on wound healing Knowledge deficit related to ulceration/compromised skin integrity Goals: Patient will demonstrate Sarah reduced rate of smoking or cessation of smoking Date Initiated: 04/09/2020 Target Resolution Date: 06/28/2020 Goal Status: Active Patient will have Sarah decrease in wound volume by X% from date: (specify in notes) Date Initiated: 04/09/2020 Target Resolution Date: 06/14/2020 Goal Status: Active Patient/caregiver will verbalize understanding of skin care regimen Date Initiated: 04/09/2020 Date Inactivated: 05/14/2020 Target Resolution Date: 05/14/2020 Goal Status: Met Ulcer/skin breakdown will have Sarah volume reduction of 30% by week 4 Date Initiated: 04/09/2020 Date Inactivated: 05/14/2020 Target Resolution Date: 05/11/2020 Unmet Reason: see wound Goal Status: Unmet meaurements. Interventions: Assess  patient/caregiver ability to obtain necessary supplies Assess patient/caregiver ability to perform ulcer/skin care regimen upon admission and as needed Assess ulceration(s) every visit Provide education on smoking Notes: Electronic Signature(s) Signed: 06/04/2020 5:31:54 PM By: Rhae Hammock RN Entered By: Rhae Hammock on 06/04/2020 08:00:39 -------------------------------------------------------------------------------- Pain Assessment Details Patient Name: Date of Service: [name] V [name] 06/04/2020 7:30 Sarah M Medical Record Number: [id number] Patient Account Number: [id number] Date of Birth/Sex: Treating RN: 1970-04-14 (50 y.o. F) Primary Care Makaia Rappa: [name] Other Clinician: Referring Tandre Conly: Treating Ciarrah Rae/Extender: Alverda Skeans in Treatment: 8 Active Problems Location of Pain Severity and Description of Pain Patient Has Paino No Site Locations Pain Management and Medication Current Pain Management: Electronic Signature(s) Signed: 06/04/2020 5:24:49 PM By: Sandre Kitty Entered By: Sandre Kitty on 06/04/2020 07:50:28 -------------------------------------------------------------------------------- Patient/Caregiver Education Details Patient Name: Date of Service: [name] 5/31/2022andnbsp7:30 Sarah M Medical Record Number: [id number] Patient Account Number: [id number] Date of Birth/Gender: Treating RN: 02/15/1970 (50 y.o. [name], [name] Primary Care Physician: [name] Other Clinician: Referring Physician: Treating Physician/Extender: [name] in Treatment: 8 Education Assessment Education Provided To: Patient Education Topics Provided Wound/Skin Impairment: Methods: Explain/Verbal Responses: State content correctly Electronic Signature(s) Signed: 06/04/2020 5:31:54 PM By: Rhae Hammock RN Entered By: Rhae Hammock on 06/04/2020  08:00:50 -------------------------------------------------------------------------------- Wound Assessment Details Patient Name: Date of Service: [name] V [name] 06/04/2020 7:30 Sarah M Medical Record Number: [id number] Patient Account Number: [id number] Date of Birth/Sex: Treating RN: 1970-07-16 (50 y.o. [name] Primary Care Corderro Koloski: [name] Other Clinician: Referring Deissy Guilbert: Treating Latoy Labriola/Extender: [name] in Treatment: 8 Wound Status Wound Number: 1 Primary Open Surgical Wound Etiology: Wound Location: Left Chest Secondary Necrosis (Radiation) Wounding Event: Surgical Injury Etiology: Date Acquired: 01/16/2020 Wound Status: Open Weeks Of Treatment: 8 Comorbid Hypertension, Osteoarthritis, Received Chemotherapy, Clustered Wound: No History: Received Radiation Photos Wound Measurements Length: (cm) 5 Width: (cm) 4.5 Depth: (cm) 1.8 Area: (cm) 17.671 Volume: (cm) 31.809 % Reduction in Area: -177.8% % Reduction in Volume: -354.5% Tunneling: Yes Location 1 Position (o'clock): 11 Maximum Distance: (cm) 1.5 Location 2 Position (o'clock): 2 Maximum Distance: (cm) 2 Location 3 Position (o'clock): 8 Maximum Distance: (cm) 2.5 Undermining: No Wound Description Classification: Full Thickness Without Exposed Support Structures Wound Margin: Well defined, not attached Exudate Amount: Medium Exudate Type: Serosanguineous Exudate Color: red, brown Foul Odor After Cleansing: No Slough/Fibrino Yes Wound  Bed Granulation Amount: Medium (34-66%) Exposed Structure Granulation Quality: Red Fascia Exposed: No Necrotic Amount: Medium (34-66%) Fat Layer (Subcutaneous Tissue) Exposed: Yes Necrotic Quality: Adherent Slough Tendon Exposed: No Muscle Exposed: No Joint Exposed: No Bone Exposed: No Electronic Signature(s) Signed: 06/04/2020 5:24:49 PM By: Sandre Kitty Signed: 06/04/2020 5:28:21 PM By: Lorrin Jackson Entered By: Sandre Kitty on 06/04/2020 11:56:05 -------------------------------------------------------------------------------- Vitals Details Patient Name: Date of Service: [name] V [name] 06/04/2020 7:30 Sarah M Medical Record Number: [id number] Patient Account Number: [id number] Date of Birth/Sex: Treating RN: November 28, 1970 (50 y.o. F) Primary Care Lynanne Delgreco: [name] Other Clinician: Referring Lorrane Mccay: Treating Callen Zuba/Extender: Alverda Skeans in Treatment: 8 Vital Signs Time Taken: 07:50 Temperature (F): 98.3 Height (in): 67 Pulse (bpm): 74 Weight (lbs): 257 Respiratory Rate (breaths/min): 18 Body Mass Index (BMI): 40.2 Blood Pressure (mmHg): 128/79 Reference Range: 80 - 120 mg / dl Electronic Signature(s) Signed: 06/04/2020 5:24:49 PM By: Sandre Kitty Entered By: Sandre Kitty on 06/04/2020 07:50:17

## 2020-06-05 ENCOUNTER — Other Ambulatory Visit: Payer: Self-pay

## 2020-06-05 ENCOUNTER — Encounter (HOSPITAL_BASED_OUTPATIENT_CLINIC_OR_DEPARTMENT_OTHER): Payer: Medicaid Other | Attending: Physician Assistant | Admitting: Physician Assistant

## 2020-06-05 DIAGNOSIS — T8131XA Disruption of external operation (surgical) wound, not elsewhere classified, initial encounter: Secondary | ICD-10-CM | POA: Insufficient documentation

## 2020-06-05 DIAGNOSIS — Z9221 Personal history of antineoplastic chemotherapy: Secondary | ICD-10-CM | POA: Diagnosis not present

## 2020-06-05 DIAGNOSIS — L59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: Secondary | ICD-10-CM | POA: Insufficient documentation

## 2020-06-05 DIAGNOSIS — Y842 Radiological procedure and radiotherapy as the cause of abnormal reaction of the patient, or of later complication, without mention of misadventure at the time of the procedure: Secondary | ICD-10-CM | POA: Diagnosis not present

## 2020-06-05 DIAGNOSIS — Y838 Other surgical procedures as the cause of abnormal reaction of the patient, or of later complication, without mention of misadventure at the time of the procedure: Secondary | ICD-10-CM | POA: Diagnosis not present

## 2020-06-05 DIAGNOSIS — Z9013 Acquired absence of bilateral breasts and nipples: Secondary | ICD-10-CM | POA: Insufficient documentation

## 2020-06-05 DIAGNOSIS — Z853 Personal history of malignant neoplasm of breast: Secondary | ICD-10-CM | POA: Insufficient documentation

## 2020-06-05 NOTE — Progress Notes (Signed)
NOCOLE, ZAMMIT ([id number]) Visit Report for 05/28/2020 HBO Details Patient Name: Date of Service: [name] 05/28/2020 8:00 A M Medical Record Number: [id number] Patient Account Number: [id number] Date of Birth/Sex: Treating RN: 13-May-1970 (50 y.o. 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 HBO Treatment Details Treatment Number: 30 Patient Type: Outpatient Chamber Type: Monoplace Chamber Serial #: [id number] Treatment Protocol: 2.5 ATA with 90 minutes oxygen, with two 5 minute air breaks Treatment Details Compression Rate Down: 2.0 psi / minute De-Compression Rate Up: 2.0 psi / minute A breaks and breathing ir Compress Tx Pressure periods Decompress Decompress Begins Reached (leave unused spaces Begins Ends blank) Chamber Pressure (ATA 1 2.5 2.5 2.5 2.5 2.5 - - 2.5 1 ) Clock Time (24 hr) 08:22 08:34 09:04 09:09 09:39 09:44 - - 10:14 10:24 Treatment Length: 122 (minutes) Treatment Segments: 4 Vital Signs Capillary Blood Glucose Reference Range: 80 - 120 mg / dl HBO Diabetic Blood Glucose Intervention Range: <131 mg/dl or >249 mg/dl Time Vitals Blood Respiratory Capillary Blood Glucose Pulse Action Type: Pulse: Temperature: Taken: Pressure: Rate: Glucose (mg/dl): Meter #: Oximetry (%) Taken: Pre 07:53 129/83 81 16 98.2 Post 10:25 124/88 81 16 97.8 Treatment Response Treatment Toleration: Well Treatment Completion Status: Treatment Completed without Adverse Event Electronic Signature(s) Signed: 05/28/2020 5:25:37 PM By: Deon Pilling Signed: 06/05/2020 9:55:24 AM By: Kalman Shan DO Entered By: Deon Pilling  on 05/28/2020 10:34:52 -------------------------------------------------------------------------------- HBO Safety Checklist Details Patient Name: Date of Service: [name] 05/28/2020 8:00 A M Medical Record Number: [id number] Patient Account Number: [id number] Date of Birth/Sex: Treating RN: 1970/12/18 (50 y.o. [name], [name] Primary Care Roena Sassaman: [name] Other Clinician: Referring Kairi Harshbarger: Treating Makenzi Bannister/Extender: Alverda Skeans in Treatment: 7 HBO Safety Checklist Items Safety Checklist Consent Form Signed Patient voided / foley secured and emptied When did you last eato this morning Last dose of injectable or oral agent crackers Ostomy pouch emptied and vented if applicable NA All implantable devices assessed, documented and approved NA Intravenous access site secured and place NA Valuables secured Linens and cotton and cotton/polyester blend (less than 51% polyester) Personal oil-based products / skin lotions / body lotions removed NA Wigs or hairpieces removed NA Smoking or tobacco materials removed NA Books / newspapers / magazines / loose paper removed NA Cologne, aftershave, perfume and deodorant removed NA Jewelry removed (may wrap wedding band) Make-up removed NA Hair care products removed NA Battery operated devices (external) removed NA Heating patches and chemical warmers removed NA Titanium eyewear removed Nail polish cured greater than 10 hours Casting material cured greater than 10 hours NA Hearing aids removed NA Loose dentures or partials removed Prosthetics have been removed NA Patient demonstrates correct use of air break device (if applicable) Patient concerns have been addressed Patient grounding bracelet on and cord attached to chamber Specifics for Inpatients (complete in addition to above) Medication sheet sent with patient Intravenous medications needed or due during therapy sent with  patient Drainage tubes (e.g. nasogastric tube or chest tube secured and vented) Endotracheal or Tracheotomy tube secured Cuff deflated of air and inflated with saline Airway suctioned Electronic Signature(s) Signed: 05/28/2020 5:25:37 PM By: Deon Pilling Entered By: Deon Pilling  on 05/28/2020 10:34:08

## 2020-06-05 NOTE — Progress Notes (Signed)
[name], [name] ([id number]) Visit Report for 05/28/2020 SuperBill Details Patient Name: Date of Service: [name] 05/28/2020 Medical Record Number: [id number] Patient Account Number: [id number] Date of Birth/Sex: Treating RN: 11-10-1970 (50 y.o. 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

## 2020-06-05 NOTE — Progress Notes (Signed)
[name], [name] ([id number]) Visit Report for 06/05/2020 Arrival Information Details Patient Name: Date of Service: Sarah [name] [name] 06/05/2020 8:00 Sarah M Medical Record Number: [id number] Patient Account Number: [id number] Date of Birth/Sex: Treating RN: 02/14/1970 (50 y.o. [name], [name] Primary Care Jaclynn Laumann: [name] Other Clinician: Referring Darletta Noblett: Treating Blair Lundeen/Extender: Randon Goldsmith in Treatment: 8 Visit Information History Since Last Visit Added or deleted any medications: No Patient Arrived: Ambulatory Any new allergies or adverse reactions: No Arrival Time: 07:49 Had Sarah fall or experienced change in No Accompanied By: self activities of daily living that may affect Transfer Assistance: None risk of falls: Patient Identification Verified: Yes Signs or symptoms of abuse/neglect since last visito No Secondary Verification Process Completed: Yes Hospitalized since last visit: No Patient Requires Transmission-Based Precautions: No Implantable device outside of the clinic excluding No Patient Has Alerts: No cellular tissue based products placed in the center since last visit: Has Dressing in Place as Prescribed: Yes Pain Present Now: No Electronic Signature(s) Signed: 06/05/2020 5:52:00 PM By: Deon Pilling Entered By: Deon Pilling on 06/05/2020 11:03:56 -------------------------------------------------------------------------------- Encounter Discharge Information Details Patient Name: Date of Service: Sarah Crochet V [name] 06/05/2020 8:00 Sarah M Medical Record Number: [id number] Patient Account Number: [id number] Date of Birth/Sex: Treating RN: Oct 19, 1970 (50 y.o. [name] Primary Care Chlora Mcbain: [name] Other Clinician: Referring Ramatoulaye Pack: Treating Marita Burnsed/Extender: Randon Goldsmith in Treatment: 8 Encounter Discharge Information Items Discharge Condition: Stable Ambulatory Status:  Ambulatory Discharge Destination: Home Transportation: Private Auto Accompanied By: self Schedule Follow-up Appointment: Yes Clinical Summary of Care: Electronic Signature(s) Signed: 06/05/2020 5:52:00 PM By: Deon Pilling Entered By: Deon Pilling on 06/05/2020 11:07:07 -------------------------------------------------------------------------------- Vitals Details Patient Name: Date of Service: [name], Sarah V [name] 06/05/2020 8:00 Sarah M Medical Record Number: [id number] Patient Account Number: [id number] Date of Birth/Sex: Treating RN: [birth year] (50 y.o. [name], [name] Primary Care Hanish Laraia: [name] Other Clinician: Referring Zephaniah Enyeart: Treating Perina Salvaggio/Extender: Randon Goldsmith in Treatment: 8 Vital Signs Time Taken: 07:49 Temperature (F): 97.5 Height (in): 67 Pulse (bpm): 78 Weight (lbs): 257 Respiratory Rate (breaths/min): 20 Body Mass Index (BMI): 40.2 Blood Pressure (mmHg): 130/87 Reference Range: 80 - 120 mg / dl Electronic Signature(s) Signed: 06/05/2020 5:52:00 PM By: Deon Pilling Entered By: Deon Pilling on 06/05/2020 11:05:14

## 2020-06-05 NOTE — Progress Notes (Signed)
[name], [name] ([id number]) Visit Report for 05/31/2020 SuperBill Details Patient Name: Date of Service: [name] 05/31/2020 Medical Record Number: [id number] Patient Account Number: [id number] Date of Birth/Sex: Treating RN: 10-03-1970 (50 y.o. 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 Facility Procedures CPT4 Code Description Modifier Quantity [id number] G0277-(Facility Use Only) HBOT full body chamber, 24min , 4 Physician Procedures Quantity CPT4 Code Description Modifier [id number] [place] [facility] PHYS HYPERBARIC OXYGEN THERAPY 1 ICD-10 Diagnosis Description 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

## 2020-06-05 NOTE — Progress Notes (Signed)
[name], [name] ([id number]) Visit Report for 05/29/2020 HBO Details Patient Name: Date of Service: [name] Sarah Sago [name] 05/29/2020 8:00 Sarah M Medical Record Number: [id number] Patient Account Number: [id number] Date of Birth/Sex: Treating RN: 05/05/1970 (50 y.o. 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 HBO Khan Details Khan Number: 31 Patient Type: Outpatient Chamber Type: Monoplace Chamber Serial #: [id number] Khan Protocol: 2.5 ATA with 90 minutes oxygen, with two 5 minute air breaks Khan Details Compression Rate Down: 2.0 psi / minute De-Compression Rate Up: 2.0 psi / minute Sarah breaks and breathing ir Compress Tx Pressure periods Decompress Decompress Begins Reached (leave unused spaces Begins Ends blank) Chamber Pressure (ATA 1 2.5 2.5 2.5 2.5 2.5 - - 2.5 1 ) Clock Time (24 hr) 08:15 08:27 08:57 09:02 09:32 09:37 - - 10:07 10:17 Khan Length: 122 (minutes) Khan Segments: 4 Vital Signs Capillary Blood Glucose Reference Range: 80 - 120 mg / dl HBO Diabetic Blood Glucose Intervention Range: <131 mg/dl or >249 mg/dl Time Vitals Blood Respiratory Capillary Blood Glucose Pulse Action Type: Pulse: Temperature: Taken: Pressure: Rate: Glucose (mg/dl): Meter #: Oximetry (%) Taken: Pre 07:43 134/86 81 18 97.9 Post 10:24 122/84 77 20 97.9 Khan Response Khan Toleration: Well Khan Completion Status: Khan Completed without Adverse Event Electronic Signature(s) Signed: 05/29/2020 4:06:25 PM By: [name] Signed: 06/05/2020 9:55:24 AM By: [name] Entered By: [name]  on 05/29/2020 10:48:36 -------------------------------------------------------------------------------- HBO Safety Checklist Details Patient Name: Date of Service: Sarah Crochet V [name] 05/29/2020 8:00 Sarah M Medical Record Number: [id number] Patient Account Number: [id number] Date of Birth/Sex: Treating RN: [birth year] (50 y.o. [name], [name] Primary Care [name]: [name] Other Clinician: Referring [name]: Treating [name]/Extender: [name] in Khan: 7 HBO Safety Checklist Items Safety Checklist Consent Form Signed Patient voided / foley secured and emptied When did you last eato this morning Last dose of injectable or oral agent crackers Ostomy pouch emptied and vented if applicable NA All implantable devices assessed, documented and approved NA Intravenous access site secured and place NA Valuables secured Linens and cotton and cotton/polyester blend (less than 51% polyester) Personal oil-based products / skin lotions / body lotions removed NA Wigs or hairpieces removed NA Smoking or tobacco materials removed NA Books / newspapers / magazines / loose paper removed NA Cologne, aftershave, perfume and deodorant removed NA Jewelry removed (may wrap wedding band) Make-up removed NA Hair care products removed NA Battery operated devices (external) removed NA Heating patches and chemical warmers removed NA Titanium eyewear removed Nail polish cured greater than 10 hours Casting material cured greater than 10 hours NA Hearing aids removed NA Loose dentures or partials removed NA Prosthetics have been removed NA Patient demonstrates correct use of air break device (if applicable) Patient concerns have been addressed Patient grounding bracelet on and cord attached to chamber Specifics for Inpatients (complete in addition to above) Medication sheet sent with patient Intravenous medications needed or due during therapy sent with  patient Drainage tubes (e.g. nasogastric tube or chest tube secured and vented) Endotracheal or Tracheotomy tube secured Cuff deflated of air and inflated with saline Airway suctioned Electronic Signature(s) Signed: 05/29/2020 4:06:25 PM By: [name] Entered By: Sarah Barry  Khan on 05/29/2020 10:47:49

## 2020-06-05 NOTE — Progress Notes (Signed)
JODE, LIPPE ([id number]) Visit Report for 05/27/2020 HBO Details Patient Name: Date of Service: [name] 05/27/2020 8:00 A M Medical Record Number: [id number] Patient Account Number: [id number] Date of Birth/Sex: Treating RN: 08/01/1970 (50 y.o. 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 HBO Treatment Details Treatment Number: 29 Patient Type: Outpatient Chamber Type: Monoplace Chamber Serial #: [id number] Treatment Protocol: 2.5 ATA with 90 minutes oxygen, with two 5 minute air breaks Treatment Details Compression Rate Down: 2.0 psi / minute De-Compression Rate Up: 2.0 psi / minute A breaks and breathing ir Compress Tx Pressure periods Decompress Decompress Begins Reached (leave unused spaces Begins Ends blank) Chamber Pressure (ATA 1 2.5 2.5 2.5 2.5 2.5 - - 2.5 1 ) Clock Time (24 hr) 08:17 08:29 08:59 09:04 09:34 09:39 - - 10:09 10:20 Treatment Length: 123 (minutes) Treatment Segments: 4 Vital Signs Capillary Blood Glucose Reference Range: 80 - 120 mg / dl HBO Diabetic Blood Glucose Intervention Range: <131 mg/dl or >249 mg/dl Time Vitals Blood Respiratory Capillary Blood Glucose Pulse Action Type: Pulse: Temperature: Taken: Pressure: Rate: Glucose (mg/dl): Meter #: Oximetry (%) Taken: Pre 07:40 130/76 85 16 97.9 Post 10:22 107/75 78 16 97.9 Treatment Response Treatment Toleration: Well Treatment Completion Status: Treatment Completed without Adverse Event Electronic Signature(s) Signed: 05/27/2020 6:20:16 PM By: Deon Pilling Signed: 06/05/2020 9:55:24 AM By: Kalman Shan DO Entered By: Deon Pilling  on 05/27/2020 10:59:00 -------------------------------------------------------------------------------- HBO Safety Checklist Details Patient Name: Date of Service: [name] 05/27/2020 8:00 A M Medical Record Number: [id number] Patient Account Number: [id number] Date of Birth/Sex: Treating RN: 1970/09/18 (50 y.o. [name], [name] Primary Care Zamarian Scarano: [name] Other Clinician: Referring Liesel Peckenpaugh: Treating Chirstopher Iovino/Extender: [name] in Treatment: 6 HBO Safety Checklist Items Safety Checklist Consent Form Signed Patient voided / foley secured and emptied When did you last eato this morning Last dose of injectable or oral agent crackers Ostomy pouch emptied and vented if applicable NA All implantable devices assessed, documented and approved NA Intravenous access site secured and place NA Valuables secured Linens and cotton and cotton/polyester blend (less than 51% polyester) Personal oil-based products / skin lotions / body lotions removed NA Wigs or hairpieces removed NA Smoking or tobacco materials removed NA Books / newspapers / magazines / loose paper removed NA Cologne, aftershave, perfume and deodorant removed NA Jewelry removed (may wrap wedding band) Make-up removed NA Hair care products removed NA Battery operated devices (external) removed NA Heating patches and chemical warmers removed NA Titanium eyewear removed NA Nail polish cured greater than 10 hours Casting material cured greater than 10 hours NA Hearing aids removed NA Loose dentures or partials removed Prosthetics have been removed NA Patient demonstrates correct use of air break device (if applicable) Patient concerns have been addressed Patient grounding bracelet on and cord attached to chamber Specifics for Inpatients (complete in addition to above) Medication sheet sent with patient Intravenous medications needed or due during therapy  sent with patient Drainage tubes (e.g. nasogastric tube or chest tube secured and vented) Endotracheal or Tracheotomy tube secured Cuff deflated of air and inflated with saline Airway suctioned Electronic Signature(s) Signed: 05/27/2020 6:20:16 PM By: Deon Pilling Entered  ByDeon Pilling on 05/27/2020 10:57:49

## 2020-06-05 NOTE — Progress Notes (Signed)
JUNEAU, DOUGHMAN ([id number]) Visit Report for 06/05/2020 HBO Details Patient Name: Date of Service: [name] 06/05/2020 8:00 A M Medical Record Number: [id number] Patient Account Number: [id number] Date of Birth/Sex: Treating RN: [birth year] (50 y.o. 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 HBO Treatment Details Treatment Number: 35 Patient Type: Outpatient Chamber Type: Monoplace Chamber Serial #: [id number] Treatment Protocol: 2.5 ATA with 90 minutes oxygen, with two 5 minute air breaks Treatment Details Compression Rate Down: 2.0 psi / minute De-Compression Rate Up: 2.0 psi / minute A breaks and breathing ir Compress Tx Pressure periods Decompress Decompress Begins Reached (leave unused spaces Begins Ends blank) Chamber Pressure (ATA 1 2.5 2.5 2.5 2.5 2.5 - - 2.5 1 ) Clock Time (24 hr) 08:00 08:12 08:42 08:47 09:17 09:22 - - 09:52 10:02 Treatment Length: 122 (minutes) Treatment Segments: 4 Vital Signs Capillary Blood Glucose Reference Range: 80 - 120 mg / dl HBO Diabetic Blood Glucose Intervention Range: <131 mg/dl or >249 mg/dl Time Vitals Blood Respiratory Capillary Blood Glucose Pulse Action Type: Pulse: Temperature: Taken: Pressure: Rate: Glucose (mg/dl): Meter #: Oximetry (%) Taken: Pre 07:49 130/87 78 20 97.5 Post 10:08 115/84 79 20 97.8 Treatment Response Treatment Toleration: Well Treatment Completion Status: Treatment Completed without Adverse Event Electronic Signature(s) Signed: 06/05/2020 5:35:00 PM By: Worthy Keeler PA-C Signed: 06/05/2020 5:52:00 PM By: Deon Pilling Entered By: Deon Pilling on  06/05/2020 11:06:44 -------------------------------------------------------------------------------- HBO Safety Checklist Details Patient Name: Date of Service: [name] 06/05/2020 8:00 A M Medical Record Number: [id number] Patient Account Number: [id number] Date of Birth/Sex: Treating RN: 1971-01-05 (50 y.o. [name], [name] Primary Care Lamarcus Spira: [name] Other Clinician: Referring Layden Caterino: Treating Juandedios Dudash/Extender: Randon Goldsmith in Treatment: 8 HBO Safety Checklist Items Safety Checklist Consent Form Signed Patient voided / foley secured and emptied When did you last eato this morning Last dose of injectable or oral agent crackers Ostomy pouch emptied and vented if applicable NA All implantable devices assessed, documented and approved NA Intravenous access site secured and place NA Valuables secured Linens and cotton and cotton/polyester blend (less than 51% polyester) Personal oil-based products / skin lotions / body lotions removed NA Wigs or hairpieces removed NA Smoking or tobacco materials removed NA Books / newspapers / magazines / loose paper removed NA Cologne, aftershave, perfume and deodorant removed NA Jewelry removed (may wrap wedding band) Make-up removed NA Hair care products removed NA Battery operated devices (external) removed NA Heating patches and chemical warmers removed NA Titanium eyewear removed Nail polish cured greater than 10 hours NA Casting material cured greater than 10 hours NA Hearing aids removed NA Loose dentures or partials removed Prosthetics have been removed NA Patient demonstrates correct use of air break device (if applicable) Patient concerns have been addressed Patient grounding bracelet on and cord attached to chamber Specifics for Inpatients (complete in addition to above) Medication sheet sent with patient Intravenous medications needed or due during therapy sent  with patient Drainage tubes (e.g. nasogastric tube or chest tube secured and vented) Endotracheal or Tracheotomy tube secured Cuff deflated of air and inflated with saline Airway suctioned Electronic Signature(s) Signed: 06/05/2020 5:52:00 PM By:  Deaton, Bobbi Entered By: Deon Pilling on 06/05/2020 11:06:08

## 2020-06-05 NOTE — Progress Notes (Signed)
[name], [name] ([id number]) Visit Report for 05/30/2020 HBO Details Patient Name: Date of Service: [name]. 05/30/2020 8:00 A M Medical Record Number: [id number] Patient Account Number: [id number] Date of Birth/Sex: Treating RN: [name], [name] (50 y.o. 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 HBO Treatment Details Treatment Number: 32 Patient Type: Outpatient Chamber Type: Monoplace Chamber Serial #: [id number] Treatment Protocol: 2.5 ATA with 90 minutes oxygen, with two 5 minute air breaks Treatment Details Compression Rate Down: 2.0 psi / minute De-Compression Rate Up: 2.0 psi / minute A breaks and breathing ir Compress Tx Pressure periods Decompress Decompress Begins Reached (leave unused spaces Begins Ends blank) Chamber Pressure (ATA 1 2.5 2.5 2.5 2.5 2.5 - - 2.5 1 ) Clock Time (24 hr) 08:10 08:22 08:52 08:57 09:Khan 09:32 - - 10:02 10:14 Treatment Length: 124 (minutes) Treatment Segments: 4 Vital Signs Capillary Blood Glucose Reference Range: 80 - 120 mg / dl HBO Diabetic Blood Glucose Intervention Range: <131 mg/dl or >249 mg/dl Time Vitals Blood Respiratory Capillary Blood Glucose Pulse Action Type: Pulse: Temperature: Taken: Pressure: Rate: Glucose (mg/dl): Meter #: Oximetry (%) Taken: Pre 07:46 123/82 83 18 98.1 Post 10:14 110/72 76 18 98 Treatment Response Treatment Completion Status: Treatment Completed without Adverse Event Electronic Signature(s) Signed: 06/04/2020 5:56:30 PM By: Levan Hurst RN, BSN Signed: 06/05/2020 9:53:33 AM By: Kalman Shan DO Entered By: Levan Hurst on 05/30/2020  10:58:01 -------------------------------------------------------------------------------- HBO Safety Checklist Details Patient Name: Date of Service: [name]. 05/30/2020 8:00 A M Medical Record Number: [id number] Patient Account Number: [id number] Date of Birth/Sex: Treating RN: [name] (50 y.o. [name] Primary Care Yale Golla: [name] Other Clinician: Referring Sherrick Araki: Treating Kaiel Weide/Extender: [name] in Treatment: 7 HBO Safety Checklist Items Safety Checklist Consent Form Signed Patient voided / foley secured and emptied When did you last eato 0700 Last dose of injectable or oral agent na Ostomy pouch emptied and vented if applicable NA All implantable devices assessed, documented and approved Intravenous access site secured and place NA Valuables secured Linens and cotton and cotton/polyester blend (less than 51% polyester) Personal oil-based products / skin lotions / body lotions removed Wigs or hairpieces removed Smoking or tobacco materials removed Books / newspapers / magazines / loose paper removed Cologne, aftershave, perfume and deodorant removed Jewelry removed (may wrap wedding band) Make-up removed Hair care products removed Battery operated devices (external) removed Heating patches and chemical warmers removed Titanium eyewear removed Nail polish cured greater than 10 hours Casting material cured greater than 10 hours NA Hearing aids removed NA Loose dentures or partials removed Prosthetics have been removed NA Patient demonstrates correct use of air break device (if applicable) Patient concerns have been addressed Patient grounding bracelet on and cord attached to chamber Specifics for Inpatients (complete in addition to above) Medication sheet sent with patient NA Intravenous medications needed or due during therapy sent with patient NA Drainage tubes (e.g. nasogastric tube or  chest tube secured and vented) NA Endotracheal or Tracheotomy tube secured NA Cuff deflated of air and inflated with saline NA Airway suctioned NA Electronic Signature(s) Signed: 06/04/2020 5:56:30 PM By: Levan Hurst RN, BSN Entered By: Levan Hurst on 05/30/2020  08:47:08 

## 2020-06-05 NOTE — Progress Notes (Signed)
[name], [name] ([id number]) Visit Report for 05/30/2020 SuperBill Details Patient Name: Date of Service: [name] [name] 05/30/2020 Medical Record Number: [id number] Patient Account Number: [id number] Date of Birth/Sex: Treating RN: 26-Feb-1970 (49 y.o. 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 Facility Procedures CPT4 Code Description Modifier Quantity [id number] G0277-(Facility Use Only) HBOT full body chamber, 8min , 4 Physician Procedures Quantity CPT4 Code Description Modifier [id number] [place] - [facility] PHYS HYPERBARIC OXYGEN THERAPY 1 ICD-10 Diagnosis Description 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

## 2020-06-05 NOTE — Progress Notes (Signed)
ARTHI, MCDONALD ([id number]) Visit Report for 05/31/2020 HBO Details Patient Name: Date of Service: [name] Sarah D. 05/31/2020 8:00 Sarah M Medical Record Number: [id number] Patient Account Number: [id number] Date of Birth/Sex: Treating RN: 1970-04-17 (50 y.o. 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 HBO Treatment Details Treatment Number: 33 Patient Type: Outpatient Chamber Type: Monoplace Chamber Serial #: [id number] Treatment Protocol: 2.5 ATA with 90 minutes oxygen, with two 5 minute air breaks Treatment Details Compression Rate Down: 2.0 psi / minute De-Compression Rate Up: 2.5 psi / minute Sarah breaks and breathing ir Compress Tx Pressure periods Decompress Decompress Begins Reached (leave unused spaces Begins Ends blank) Chamber Pressure (ATA 1 2.5 2.5 2.5 2.5 2.5 - - 2.5 1 ) Clock Time (24 hr) 08:08 08:20 08:50 08:55 09:25 09:30 - - 10:00 10:10 Treatment Length: 122 (minutes) Treatment Segments: 4 Vital Signs Capillary Blood Glucose Reference Range: 80 - 120 mg / dl HBO Diabetic Blood Glucose Intervention Range: <131 mg/dl or >249 mg/dl Time Vitals Blood Respiratory Capillary Blood Glucose Pulse Action Type: Pulse: Temperature: Taken: Pressure: Rate: Glucose (mg/dl): Meter #: Oximetry (%) Taken: Pre 07:51 140/95 99 18 97.8 Post 10:10 118/76 80 16 98 Treatment Response Treatment Completion Status: Treatment Completed without Adverse Event Electronic Signature(s) Signed: 06/04/2020 5:56:30 PM By: Levan Hurst RN, BSN Signed: 06/05/2020 9:53:33 AM By: Kalman Shan DO Entered By: Levan Hurst on 05/31/2020  13:06:30 -------------------------------------------------------------------------------- HBO Safety Checklist Details Patient Name: Date of Service: [name] V Sarah D. 05/31/2020 8:00 Sarah M Medical Record Number: [id number] Patient Account Number: [id number] Date of Birth/Sex: Treating RN: 01-12-1970 (50 y.o. [name] Primary Care Amear Strojny: [name] Other Clinician: Referring Nalleli Largent: Treating Esperansa Sarabia/Extender: [name] in Treatment: 7 HBO Safety Checklist Items Safety Checklist Consent Form Signed Patient voided / foley secured and emptied When did you last eato 0700 Last dose of injectable or oral agent na Ostomy pouch emptied and vented if applicable NA All implantable devices assessed, documented and approved Intravenous access site secured and place NA Valuables secured Linens and cotton and cotton/polyester blend (less than 51% polyester) Personal oil-based products / skin lotions / body lotions removed Wigs or hairpieces removed Smoking or tobacco materials removed Books / newspapers / magazines / loose paper removed Cologne, aftershave, perfume and deodorant removed Jewelry removed (may wrap wedding band) Make-up removed Hair care products removed Battery operated devices (external) removed Heating patches and chemical warmers removed Titanium eyewear removed Nail polish cured greater than 10 hours Casting material cured greater than 10 hours NA Hearing aids removed NA Loose dentures or partials removed Prosthetics have been removed NA Patient demonstrates correct use of air break device (if applicable) Patient concerns have been addressed Patient grounding bracelet on and cord attached to chamber Specifics for Inpatients (complete in addition to above) Medication sheet sent with patient NA Intravenous medications needed or due during therapy sent with patient NA Drainage tubes (e.g. nasogastric tube or  chest tube secured and vented) NA Endotracheal or Tracheotomy tube secured NA Cuff deflated of air and inflated with saline NA Airway suctioned NA Electronic Signature(s) Signed: 06/04/2020 5:56:30 PM By: Levan Hurst RN, BSN Entered By: Levan Hurst on 05/31/2020  10:09:21 

## 2020-06-05 NOTE — Progress Notes (Signed)
[name], [name] ([id number]) Visit Report for 05/27/2020 SuperBill Details Patient Name: Date of Service: [name] 05/27/2020 Medical Record Number: [id number] Patient Account Number: [id number] Date of Birth/Sex: Treating RN: 12/18/1970 (50 y.o. 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 Facility Procedures CPT4 Code Description Modifier Quantity 05183358 G0277-(Facility Use Only) HBOT full body chamber, 31min , 4 Physician Procedures Quantity CPT4 Code Description Modifier 2518984 21031 - WC PHYS HYPERBARIC OXYGEN THERAPY 1 ICD-10 Diagnosis Description 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 T81.31XD Disruption of external operation (surgical) wound, not elsewhere classified, subsequent encounter Electronic Signature(s) Signed: 05/27/2020 6:20:16 PM By: Deon Pilling Signed: 06/05/2020 9:55:24 AM By: Kalman Shan DO Entered By: Deon Pilling on 05/27/2020 10:59:19

## 2020-06-05 NOTE — Progress Notes (Signed)
[name], [name] ([id number]) Visit Report for 06/05/2020 SuperBill Details Patient Name: Date of Service: [name] 06/05/2020 Medical Record Number: [id number] Patient Account Number: [id number] Date of Birth/Sex: Treating RN: [birth year] (50 y.o. 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

## 2020-06-06 ENCOUNTER — Encounter (HOSPITAL_BASED_OUTPATIENT_CLINIC_OR_DEPARTMENT_OTHER): Payer: Medicaid Other | Admitting: Internal Medicine

## 2020-06-06 ENCOUNTER — Other Ambulatory Visit: Payer: Self-pay

## 2020-06-06 DIAGNOSIS — L59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: Secondary | ICD-10-CM | POA: Diagnosis not present

## 2020-06-07 ENCOUNTER — Other Ambulatory Visit: Payer: Self-pay

## 2020-06-07 ENCOUNTER — Encounter (HOSPITAL_BASED_OUTPATIENT_CLINIC_OR_DEPARTMENT_OTHER): Payer: Medicaid Other | Admitting: Internal Medicine

## 2020-06-07 DIAGNOSIS — L59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: Secondary | ICD-10-CM | POA: Diagnosis not present

## 2020-06-10 ENCOUNTER — Other Ambulatory Visit: Payer: Self-pay

## 2020-06-10 ENCOUNTER — Encounter (HOSPITAL_BASED_OUTPATIENT_CLINIC_OR_DEPARTMENT_OTHER): Payer: Medicaid Other | Admitting: Internal Medicine

## 2020-06-10 DIAGNOSIS — L59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: Secondary | ICD-10-CM | POA: Diagnosis not present

## 2020-06-10 NOTE — Progress Notes (Signed)
[name], [name] ([id number]) Visit Report for 06/10/2020 Arrival Information Details Patient Name: Date of Service: [name] 06/10/2020 8:00 A M Medical Record Number: [id number] Patient Account Number: [id number] Date of Birth/Sex: Treating RN: December 16, 1970 (50 y.o. [name], [name] Primary Care Horrace Hanak: [name] Other Clinician: Referring Aiman Noe: Treating Navy Rothschild/Extender: [name] in Treatment: 8 Visit Information History Since Last Visit Added or deleted any medications: No Patient Arrived: Ambulatory Any new allergies or adverse reactions: No Arrival Time: 07:50 Had a fall or experienced change in No Accompanied By: self activities of daily living that may affect Transfer Assistance: None risk of falls: Patient Identification Verified: Yes Signs or symptoms of abuse/neglect since last visito No Secondary Verification Process Completed: Yes Hospitalized since last visit: No Patient Requires Transmission-Based Precautions: No Implantable device outside of the clinic excluding No Patient Has Alerts: No cellular tissue based products placed in the center since last visit: Has Dressing in Place as Prescribed: Yes Pain Present Now: No Electronic Signature(s) Signed: 06/10/2020 5:44:33 PM By: Deon Pilling Entered By: Deon Pilling on 06/10/2020 10:49:20 -------------------------------------------------------------------------------- Encounter Discharge Information Details Patient Name: Date of Service: [name] V [name] 06/10/2020 8:00 A M Medical Record Number: [id number] Patient Account Number: [id number] Date of Birth/Sex: Treating RN: [birth year] (49 y.o. [name] Primary Care Daaron Dimarco: [name] Other Clinician: Referring Cela Newcom: Treating Leasa Kincannon/Extender: [name] in Treatment: 8 Encounter Discharge Information Items Discharge Condition: Stable Ambulatory Status:  Ambulatory Discharge Destination: Home Transportation: Private Auto Accompanied By: self Schedule Follow-up Appointment: Yes Clinical Summary of Care: Electronic Signature(s) Signed: 06/10/2020 5:44:33 PM By: Deon Pilling Entered By: Deon Pilling on 06/10/2020 10:51:37 -------------------------------------------------------------------------------- Patient/Caregiver Education Details Patient Name: Date of Service: [name] [name] 6/6/2022andnbsp8:00 A M Medical Record Number: [id number] Patient Account Number: [id number] Date of Birth/Gender: Treating RN: [birth year] (50 y.o. [name] Primary Care Physician: [name] Other Clinician: Referring Physician: Treating Physician/Extender: [name] in Treatment: 8 Education Assessment Education Provided To: Patient Education Topics Provided Wound/Skin Impairment: Handouts: Skin Care Do's and Dont's Methods: Explain/Verbal Responses: Reinforcements needed Electronic Signature(s) Signed: 06/10/2020 5:44:33 PM By: Deon Pilling Entered By: Deon Pilling on 06/10/2020 10:51:28 -------------------------------------------------------------------------------- Vitals Details Patient Name: Date of Service: [name] V [name] 06/10/2020 8:00 A M Medical Record Number: [id number] Patient Account Number: [id number] Date of Birth/Sex: Treating RN: 16-Aug-1970 (50 y.o. [name], [name] Primary Care [name]: [name] Other Clinician: Referring Wane Mollett: Treating Naydeen Speirs/Extender: [name] in Treatment: 8 Vital Signs Time Taken: 07:50 Temperature (F): 97.7 Height (in): 67 Pulse (bpm): 83 Weight (lbs): 257 Respiratory Rate (breaths/min): 18 Body Mass Index (BMI): 40.2 Blood Pressure (mmHg): 127/92 Reference Range: 80 - 120 mg / dl Electronic Signature(s) Signed: 06/10/2020 5:44:33 PM By: Deon Pilling Entered By: Deon Pilling on 06/10/2020  10:49:38

## 2020-06-10 NOTE — Progress Notes (Signed)
LILLIE, PORTNER ([id number]) Visit Report for 06/10/2020 SuperBill Details Patient Name: Date of Service: [name] 06/10/2020 Medical Record Number: [id number] Patient Account Number: [id number] Date of Birth/Sex: Treating RN: [birth year] (50 y.o. 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

## 2020-06-10 NOTE — Progress Notes (Signed)
[name], [name] ([id number]) Visit Report for 06/10/2020 HBO Details Patient Name: Date of Service: Sarah BDELA Sarah [name] 06/10/2020 8:00 Sarah M Medical Record Number: [id number] Patient Account Number: [id number] Date of Birth/Sex: Treating RN: August 25, 1970 (50 y.o. 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 HBO Treatment Details Treatment Number: 38 Patient Type: Outpatient Chamber Type: Monoplace Chamber Serial #: [id number] Treatment Protocol: 2.5 ATA with 90 minutes oxygen, with two 5 minute air breaks Treatment Details Compression Rate Down: 2.0 psi / minute De-Compression Rate Up: 2.5 psi / minute Sarah breaks and breathing ir Compress Tx Pressure periods Decompress Decompress Begins Reached (leave unused spaces Begins Ends blank) Chamber Pressure (ATA 1 2.5 2.5 2.5 2.5 2.5 - - 2.5 1 ) Clock Time (24 hr) 07:52 08:03 08:33 08:38 09:08 09:14 - - 09:44 09:54 Treatment Length: 122 (minutes) Treatment Segments: 4 Vital Signs Capillary Blood Glucose Reference Range: 80 - 120 mg / dl HBO Diabetic Blood Glucose Intervention Range: <131 mg/dl or >249 mg/dl Time Vitals Blood Respiratory Capillary Blood Glucose Pulse Action Type: Pulse: Temperature: Taken: Pressure: Rate: Glucose (mg/dl): Meter #: Oximetry (%) Taken: Pre 07:50 127/92 83 18 97.7 Post 09:55 115/81 72 20 97.7 Treatment Response Treatment Toleration: Well Treatment Completion Status: Treatment Completed without Adverse Event Antonisha Waskey Notes No concerns with treatment given Physician HBO Attestation: I certify that I supervised this HBO treatment in accordance with  Medicare guidelines. Sarah trained emergency response team is readily available per Yes hospital policies and procedures. Continue HBOT as ordered. Yes Electronic Signature(s) Signed: 06/10/2020 4:50:54 PM By: [name] MD Entered By: [name] on 06/10/2020 12:29:12 -------------------------------------------------------------------------------- HBO Safety Checklist Details Patient Name: Date of Service: [name], Sarah V [name] 06/10/2020 8:00 Sarah M Medical Record Number: [id number] Patient Account Number: [id number] Date of Birth/Sex: Treating RN: 02-24-1970 (50 y.o. [name], [name] Primary Care Manfred Laspina: [name] Other Clinician: Referring Coralynn Gaona: Treating Kristen Fromm/Extender: [name] in Treatment: 8 HBO Safety Checklist Items Safety Checklist Consent Form Signed Patient voided / foley secured and emptied When did you last eato this morning Last dose of injectable or oral agent crackers Ostomy pouch emptied and vented if applicable NA All implantable devices assessed, documented and approved NA Intravenous access site secured and place NA Valuables secured Linens and cotton and cotton/polyester blend (less than 51% polyester) Personal oil-based products / skin lotions / body lotions removed NA Wigs or hairpieces removed NA Smoking or tobacco materials removed NA Books / newspapers / magazines / loose paper removed NA Cologne, aftershave, perfume and deodorant removed NA Jewelry removed (may wrap wedding band) Make-up removed NA Hair care products removed NA Battery operated devices (external) removed NA Heating patches and chemical warmers removed NA Titanium eyewear removed Nail polish cured greater than 10 hours Casting material cured greater than 10 hours NA Hearing aids removed NA Loose dentures or partials removed Prosthetics have been removed NA Patient demonstrates correct use of air break device (if  applicable) Patient concerns have been addressed Patient grounding bracelet on and cord attached to chamber Specifics for Inpatients (complete in addition to above) Medication sheet sent with patient Intravenous medications needed or due during therapy sent with patient Drainage tubes (  e.g. nasogastric tube or chest tube secured and vented) Endotracheal or Tracheotomy tube secured Cuff deflated of air and inflated with saline Airway suctioned Electronic Signature(s) Signed: 06/10/2020 5:44:33 PM By: Deon Pilling Entered By: Deon Pilling on 06/10/2020 10:50:07

## 2020-06-11 ENCOUNTER — Encounter (HOSPITAL_BASED_OUTPATIENT_CLINIC_OR_DEPARTMENT_OTHER): Payer: Medicaid Other | Admitting: Internal Medicine

## 2020-06-11 ENCOUNTER — Other Ambulatory Visit (HOSPITAL_COMMUNITY): Payer: Self-pay

## 2020-06-11 ENCOUNTER — Telehealth: Payer: Self-pay | Admitting: *Deleted

## 2020-06-11 ENCOUNTER — Other Ambulatory Visit: Payer: Self-pay

## 2020-06-11 DIAGNOSIS — L59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: Secondary | ICD-10-CM | POA: Diagnosis not present

## 2020-06-11 NOTE — Progress Notes (Signed)
[name], [name] ([id number]) Visit Report for 06/11/2020 Arrival Information Details Patient Name: Date of Service: Sarah [name] [name] 06/11/2020 7:30 Sarah M Medical Record Number: [id number] Patient Account Number: [id number] Date of Birth/Sex: Treating RN: 07/30/1970 (50 y.o. [name], [name] Primary Care Quanah Majka: [name] Other Clinician: Referring Tyrion Glaude: Treating Larue Drawdy/Extender: [name] in Treatment: 9 Visit Information History Since Last Visit Added or deleted any medications: No Patient Arrived: Ambulatory Any new allergies or adverse reactions: No Arrival Time: 07:52 Had Sarah fall or experienced change in No Accompanied By: self activities of daily living that may affect Transfer Assistance: None risk of falls: Patient Identification Verified: Yes Signs or symptoms of abuse/neglect since last visito No Secondary Verification Process Completed: Yes Hospitalized since last visit: No Patient Requires Transmission-Based Precautions: No Implantable device outside of the clinic excluding No Patient Has Alerts: No cellular tissue based products placed in the center since last visit: Has Dressing in Place as Prescribed: Yes Pain Present Now: No Electronic Signature(s) Signed: 06/11/2020 5:39:03 PM By: Deon Pilling Entered By: Deon Pilling on 06/11/2020 07:55:10 -------------------------------------------------------------------------------- Clinic Level of Care Assessment Details Patient Name: Date of Service: Sarah BDELA Sarah Sarah Reach V [name] 06/11/2020 7:30 Sarah M Medical Record Number: [id number] Patient Account Number: [id number] Date of Birth/Sex: Treating RN: [birth year] (50 y.o. [name], [name] Primary Care Yoselyn Mcglade: [name] Other Clinician: Referring Sholanda Croson: Treating Derico Mitton/Extender: [name] in Treatment: 9 Clinic Level of Care Assessment Items TOOL 4 Quantity Score X- 1 0 Use when only an  EandM is performed on FOLLOW-UP visit ASSESSMENTS - Nursing Assessment / Reassessment X- 1 10 Reassessment of Co-morbidities (includes updates in patient status) X- 1 5 Reassessment of Adherence to Treatment Plan ASSESSMENTS - Wound and Skin Sarah ssessment / Reassessment X - Simple Wound Assessment / Reassessment - one wound 1 5 []  - 0 Complex Wound Assessment / Reassessment - multiple wounds X- 1 10 Dermatologic / Skin Assessment (not related to wound area) ASSESSMENTS - Focused Assessment []  - 0 Circumferential Edema Measurements - multi extremities X- 1 10 Nutritional Assessment / Counseling / Intervention []  - 0 Lower Extremity Assessment (monofilament, tuning fork, pulses) []  - 0 Peripheral Arterial Disease Assessment (using hand held doppler) ASSESSMENTS - Ostomy and/or Continence Assessment and Care []  - 0 Incontinence Assessment and Management []  - 0 Ostomy Care Assessment and Management (repouching, etc.) PROCESS - Coordination of Care X - Simple Patient / Family Education for ongoing care 1 15 []  - 0 Complex (extensive) Patient / Family Education for ongoing care X- 1 10 Staff obtains Programmer, systems, Records, T Results / Process Orders est []  - 0 Staff telephones HHA, Nursing Homes / Clarify orders / etc []  - 0 Routine Transfer to another Facility (non-emergent condition) []  - 0 Routine Hospital Admission (non-emergent condition) []  - 0 New Admissions / Biomedical engineer / Ordering NPWT Apligraf, etc. , []  - 0 Emergency Hospital Admission (emergent condition) X- 1 10 Simple Discharge Coordination []  - 0 Complex (extensive) Discharge Coordination PROCESS - Special Needs []  - 0 Pediatric / Minor Patient Management []  - 0 Isolation Patient Management []  - 0 Hearing / Language / Visual special needs []  - 0 Assessment of Community assistance (transportation, D/C planning, etc.) []  - 0 Additional assistance / Altered mentation []  - 0 Support Surface(s)  Assessment (bed, cushion, seat, etc.) INTERVENTIONS - Wound Cleansing / Measurement X - Simple Wound Cleansing - one wound 1 5 []  -  0 Complex Wound Cleansing - multiple wounds X- 1 5 Wound Imaging (photographs - any number of wounds) []  - 0 Wound Tracing (instead of photographs) []  - 0 Simple Wound Measurement - one wound []  - 0 Complex Wound Measurement - multiple wounds INTERVENTIONS - Wound Dressings X - Small Wound Dressing one or multiple wounds 1 10 []  - 0 Medium Wound Dressing one or multiple wounds []  - 0 Large Wound Dressing one or multiple wounds []  - 0 Application of Medications - topical []  - 0 Application of Medications - injection INTERVENTIONS - Miscellaneous []  - 0 External ear exam []  - 0 Specimen Collection (cultures, biopsies, blood, body fluids, etc.) []  - 0 Specimen(s) / Culture(s) sent or taken to Lab for analysis []  - 0 Patient Transfer (multiple staff / Civil Service fast streamer / Similar devices) []  - 0 Simple Staple / Suture removal (25 or less) []  - 0 Complex Staple / Suture removal (26 or more) []  - 0 Hypo / Hyperglycemic Management (close monitor of Blood Glucose) []  - 0 Ankle / Brachial Index (ABI) - do not check if billed separately X- 1 5 Vital Signs Has the patient been seen at the hospital within the last three years: Yes Total Score: 100 Level Of Care: New/Established - Level 3 Electronic Signature(s) Signed: 06/11/2020 5:39:03 PM By: Deon Pilling Entered By: Deon Pilling on 06/11/2020 08:01:26 -------------------------------------------------------------------------------- Encounter Discharge Information Details Patient Name: Date of Service: Sarah Crochet V [name] 06/11/2020 7:30 Sarah M Medical Record Number: [id number] Patient Account Number: [id number] Date of Birth/Sex: Treating RN: 12/16/1970 (50 y.o. [name] Primary Care Caridad Silveira: [name] Other Clinician: Referring Shlonda Dolloff: Treating Adel Neyer/Extender: [name] in Treatment: 9 Encounter Discharge Information Items Discharge Condition: Stable Ambulatory Status: Ambulatory Discharge Destination: Other (Note Required) Telephoned: No Orders Sent: No Transportation: Private Auto Accompanied By: self Schedule Follow-up Appointment: Yes Clinical Summary of Care: Notes Patient to HBO treatment. Electronic Signature(s) Signed: 06/11/2020 5:39:03 PM By: Deon Pilling Entered By: Deon Pilling on 06/11/2020 08:00:44 -------------------------------------------------------------------------------- Patient/Caregiver Education Details Patient Name: Date of Service: Sarah [name] 6/7/2022andnbsp7:30 Sarah M Medical Record Number: [id number] Patient Account Number: [id number] Date of Birth/Gender: Treating RN: [birth year] (50 y.o. [name] Primary Care Physician: [name] Other Clinician: Referring Physician: Treating Physician/Extender: [name] in Treatment: 9 Education Assessment Education Provided To: Patient Education Topics Provided Wound/Skin Impairment: Handouts: Skin Care Do's and Dont's Methods: Explain/Verbal Responses: Reinforcements needed Electronic Signature(s) Signed: 06/11/2020 5:39:03 PM By: Deon Pilling Entered By: Deon Pilling on 06/11/2020 08:00:19 -------------------------------------------------------------------------------- Wound Assessment Details Patient Name: Date of Service: Sarah Crochet V [name] 06/11/2020 7:30 Sarah M Medical Record Number: [id number] Patient Account Number: [id number] Date of Birth/Sex: Treating RN: 29-May-1970 (50 y.o. [name], [name] Primary Care Nellie Chevalier: [name] Other Clinician: Referring Sherryn Pollino: Treating Jaylissa Felty/Extender: [name] in Treatment: 9 Wound Status Wound Number: 1 Primary Open Surgical Wound Etiology: Wound Location: Left Chest Secondary Necrosis  (Radiation) Wounding Event: Surgical Injury Etiology: Date Acquired: 01/16/2020 Wound Status: Open Weeks Of Treatment: 9 Comorbid Hypertension, Osteoarthritis, Received Chemotherapy, Clustered Wound: No History: Received Radiation Wound Measurements Length: (cm) 5 Width: (cm) 4.5 Depth: (cm) 1.8 Area: (cm) 17.671 Volume: (cm) 31.809 % Reduction in Area: -177.8% % Reduction in Volume: -354.5% Epithelialization: Small (1-33%) Tunneling: No Wound Description Classification: Full Thickness Without Exposed Support Structures Wound Margin: Well defined, not attached Exudate Amount: Medium  Exudate Type: Serosanguineous Exudate Color: red, brown Foul Odor After Cleansing: No Slough/Fibrino Yes Wound Bed Granulation Amount: Medium (34-66%) Exposed Structure Granulation Quality: Red Fascia Exposed: No Necrotic Amount: Medium (34-66%) Fat Layer (Subcutaneous Tissue) Exposed: Yes Necrotic Quality: Adherent Slough Tendon Exposed: No Muscle Exposed: No Joint Exposed: No Bone Exposed: No Treatment Notes Wound #1 (Chest) Wound Laterality: Left Cleanser Normal Saline Discharge Instruction: Cleanse the wound with Normal Saline prior to applying Sarah clean dressing using gauze sponges, not tissue or cotton balls. Peri-Wound Care Topical Primary Dressing Promogran Prisma Matrix, 4.34 (sq in) (silver collagen) Discharge Instruction: Moisten collagen with saline or hydrogel Secondary Dressing Zetuvit Plus 4x4 in Discharge Instruction: Apply over primary dressing as directed. Secured With Compression Wrap Compression Stockings Add-Ons Education officer, community) Signed: 06/11/2020 5:39:03 PM By: Deon Pilling Entered By: Deon Pilling on 06/11/2020 07:59:31 -------------------------------------------------------------------------------- Vitals Details Patient Name: Date of Service: Sarah Crochet V [name] 06/11/2020 7:30 Sarah M Medical Record Number: [id number] Patient Account  Number: [id number] Date of Birth/Sex: Treating RN: [birth year] (50 y.o. [name], [name] Primary Care Rolfe Hartsell: [name] Other Clinician: Referring Viera Okonski: Treating Waseem Suess/Extender: [name] in Treatment: 9 Vital Signs Time Taken: 07:52 Temperature (F): 97.9 Height (in): 67 Pulse (bpm): 86 Weight (lbs): 257 Respiratory Rate (breaths/min): 20 Body Mass Index (BMI): 40.2 Blood Pressure (mmHg): 115/81 Reference Range: 80 - 120 mg / dl Electronic Signature(s) Signed: 06/11/2020 5:39:03 PM By: Deon Pilling Entered By: Deon Pilling on 06/11/2020 07:55:27

## 2020-06-11 NOTE — Progress Notes (Signed)
[name], [name] ([id number]) Visit Report for 06/11/2020 SuperBill Details Patient Name: Date of Service: [name] 06/11/2020 Medical Record Number: [id number] Patient Account Number: [id number] Date of Birth/Sex: Treating RN: Dec 05, 1970 (50 y.o. 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

## 2020-06-11 NOTE — Telephone Encounter (Signed)
Pt's husband- [name] - called to this RN to state [name] completed her last bariatric wound session yesterday.  They were contacted by the reconstruction MD and surgery is currently scheduled for 07/15/2020.  [name] is asking about need to start olaparib- before having surgery.  This RN informed him [facility] out of the office- but inquiry will be reviewed by covering provider and call returned to him.  His return call number is [phone].

## 2020-06-12 ENCOUNTER — Other Ambulatory Visit: Payer: Self-pay

## 2020-06-12 ENCOUNTER — Encounter (HOSPITAL_BASED_OUTPATIENT_CLINIC_OR_DEPARTMENT_OTHER): Payer: Medicaid Other | Admitting: Physician Assistant

## 2020-06-12 DIAGNOSIS — L59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: Secondary | ICD-10-CM | POA: Diagnosis not present

## 2020-06-12 NOTE — Progress Notes (Signed)
LIBI, CORSO ([id number]) Visit Report for 06/06/2020 SuperBill Details Patient Name: Date of Service: [name] [name] 06/06/2020 Medical Record Number: [id number] Patient Account Number: [id number] Date of Birth/Sex: Treating RN: 10/05/1970 (50 y.o. 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 Disruption of external operation (surgical) wound, not elsewhere classified, subsequent encounter 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 Facility Procedures CPT4 Code Description Modifier Quantity 24469507 G0277-(Facility Use Only) HBOT full body chamber, 72min , 4 Physician Procedures Quantity CPT4 Code Description Modifier [id number] 18335 - WC PHYS HYPERBARIC OXYGEN THERAPY 1 ICD-10 Diagnosis Description 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

## 2020-06-12 NOTE — Progress Notes (Signed)
[name], [name] ([id number]) Visit Report for 06/06/2020 Arrival Information Details Patient Name: Date of Service: [name] 06/06/2020 8:00 A M Medical Record Number: [id number] Patient Account Number: [id number] Date of Birth/Sex: Treating RN: 1970/03/22 (50 y.o. [name], [name] Primary Care Jacere Pangborn: [name] Other Clinician: Referring Jaben Benegas: Treating Yvone Slape/Extender: [name] in Treatment: 8 Visit Information History Since Last Visit Added or deleted any medications: No Patient Arrived: Ambulatory Any new allergies or adverse reactions: No Arrival Time: 07:51 Had a fall or experienced change in No Accompanied By: alone activities of daily living that may affect Transfer Assistance: None risk of falls: Patient Identification Verified: Yes Signs or symptoms of abuse/neglect since last visito No Secondary Verification Process Completed: Yes Hospitalized since last visit: No Patient Requires Transmission-Based Precautions: No Implantable device outside of the clinic excluding No Patient Has Alerts: No cellular tissue based products placed in the center since last visit: Has Dressing in Place as Prescribed: Yes Pain Present Now: No Electronic Signature(s) Signed: 06/12/2020 5:57:05 PM By: Levan Hurst RN, BSN Entered By: Levan Hurst on 06/06/2020 08:45:14 -------------------------------------------------------------------------------- Encounter Discharge Information Details Patient Name: Date of Service: [name] V [name] 06/06/2020 8:00 A M Medical Record Number: [id number] Patient Account Number: [id number] Date of Birth/Sex: Treating RN: April 07, 1970 (50 y.o. [name] Primary Care Kelsay Haggard: [name] Other Clinician: Referring Lucely Leard: Treating Meiko Ives/Extender: [name] in Treatment: 8 Encounter Discharge Information Items Discharge Condition:  Stable Ambulatory Status: Ambulatory Discharge Destination: Home Transportation: Private Auto Accompanied By: alone Schedule Follow-up Appointment: Yes Clinical Summary of Care: Patient Declined Electronic Signature(s) Signed: 06/12/2020 5:57:05 PM By: Levan Hurst RN, BSN Entered By: Levan Hurst on 06/06/2020 10:06:21 -------------------------------------------------------------------------------- Vitals Details Patient Name: Date of Service: [name] V [name] 06/06/2020 8:00 A M Medical Record Number: [id number] Patient Account Number: [id number] Date of Birth/Sex: Treating RN: 04-22-1970 (50 y.o. [name] Primary Care Lavere Stork: [name] Other Clinician: Referring Surena Welge: Treating Denzil Mceachron/Extender: [name] in Treatment: 8 Vital Signs Time Taken: 07:51 Temperature (F): 98.2 Height (in): 67 Pulse (bpm): 83 Weight (lbs): 257 Respiratory Rate (breaths/min): 18 Body Mass Index (BMI): 40.2 Blood Pressure (mmHg): 130/78 Reference Range: 80 - 120 mg / dl Electronic Signature(s) Signed: 06/12/2020 5:57:05 PM By: Levan Hurst RN, BSN Entered By: Levan Hurst on 06/06/2020 08:51:40

## 2020-06-12 NOTE — Progress Notes (Signed)
[name], [name] ([id number]) Visit Report for 06/12/2020 Arrival Information Details Patient Name: Date of Service: [name] 06/12/2020 8:00 A M Medical Record Number: [id number] Patient Account Number: [id number] Date of Birth/Sex: Treating RN: 1970/05/21 (50 y.o. [name], [name] Primary Care Jaiona Simien: [name] Other Clinician: Referring Aleene Swanner: Treating Lyna Laningham/Extender: Randon Goldsmith in Treatment: 9 Visit Information History Since Last Visit Added or deleted any medications: No Patient Arrived: Ambulatory Any new allergies or adverse reactions: No Arrival Time: 08:00 Had a fall or experienced change in No Accompanied By: self activities of daily living that may affect Transfer Assistance: None risk of falls: Patient Identification Verified: Yes Signs or symptoms of abuse/neglect since last visito No Secondary Verification Process Completed: Yes Hospitalized since last visit: No Patient Requires Transmission-Based Precautions: No Implantable device outside of the clinic excluding No Patient Has Alerts: No cellular tissue based products placed in the center since last visit: Has Dressing in Place as Prescribed: Yes Pain Present Now: No Notes Per patient COVID test scheduled for 07/11/2020 and surgery with Dr. Claudia Desanctis 07/15/2020. MD and nurse manager made aware. Electronic Signature(s) Signed: 06/12/2020 6:05:11 PM By: Deon Pilling Entered By: Deon Pilling on 06/12/2020 15:20:19 -------------------------------------------------------------------------------- Encounter Discharge Information Details Patient Name: Date of Service: Sarah Crochet V [name] 06/12/2020 8:00 A M Medical Record Number: [id number] Patient Account Number: [id number] Date of Birth/Sex: Treating RN: 03-18-1970 (50 y.o. [name] Primary Care Lynea Rollison: [name] Other Clinician: Referring Zhoe Catania: Treating Mammie Meras/Extender: Randon Goldsmith in Treatment: 9 Encounter Discharge Information Items Discharge Condition: Stable Ambulatory Status: Ambulatory Discharge Destination: Home Transportation: Private Auto Accompanied By: self Schedule Follow-up Appointment: Yes Clinical Summary of Care: Electronic Signature(s) Signed: 06/12/2020 6:05:11 PM By: Deon Pilling Entered By: Deon Pilling on 06/12/2020 15:19:32 -------------------------------------------------------------------------------- Vitals Details Patient Name: Date of Service: Sarah Crochet V [name] 06/12/2020 8:00 A M Medical Record Number: [id number] Patient Account Number: [id number] Date of Birth/Sex: Treating RN: Jan 14, 1970 (50 y.o. [name], [name] Primary Care Shawneequa Baldridge: [name] Other Clinician: Referring Jailene Cupit: Treating Leondro Coryell/Extender: Randon Goldsmith in Treatment: 9 Vital Signs Time Taken: 08:00 Temperature (F): 97.5 Height (in): 67 Pulse (bpm): 91 Weight (lbs): 257 Respiratory Rate (breaths/min): 20 Body Mass Index (BMI): 40.2 Blood Pressure (mmHg): 123/79 Reference Range: 80 - 120 mg / dl Electronic Signature(s) Signed: 06/12/2020 6:05:11 PM By: Deon Pilling Entered By: Deon Pilling on 06/12/2020 15:16:50

## 2020-06-12 NOTE — Progress Notes (Signed)
[name], [name] ([id number]) Visit Report for 06/11/2020 HBO Details Patient Name: Date of Service: [name] 06/11/2020 8:00 A M Medical Record Number: [id number] Patient Account Number: [id number] Date of Birth/Sex: Treating RN: August 14, 1970 (50 y.o. 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 HBO Treatment Details Treatment Number: 39 Patient Type: Outpatient Chamber Type: Monoplace Chamber Serial #: [id number] Treatment Protocol: 2.5 ATA with 90 minutes oxygen, with two 5 minute air breaks Treatment Details Compression Rate Down: 2.5 psi / minute De-Compression Rate Up: 2.5 psi / minute A breaks and breathing ir Compress Tx Pressure periods Decompress Decompress Begins Reached (leave unused spaces Begins Ends blank) Chamber Pressure (ATA 1 2.5 2.5 2.5 2.5 2.5 - - 2.5 1 ) Clock Time (24 hr) 08:06 08:16 08:46 08:51 09:21 09:26 - - 09:56 10:06 Treatment Length: 120 (minutes) Treatment Segments: 4 Vital Signs Capillary Blood Glucose Reference Range: 80 - 120 mg / dl HBO Diabetic Blood Glucose Intervention Range: <131 mg/dl or >249 mg/dl Time Vitals Blood Respiratory Capillary Blood Glucose Pulse Action Type: Pulse: Temperature: Taken: Pressure: Rate: Glucose (mg/dl): Meter #: Oximetry (%) Taken: Pre 07:55 115/81 86 20 97.9 Post 10:06 122/78 77 18 97.8 Treatment Response Treatment Completion Status: Treatment Completed without Adverse Event Randell Detter Notes No concerns with treatment given. She tells me that her surgery with Dr. Para Skeans surgery] is currently working its way through insurance for  approval Physician HBO Attestation: I certify that I supervised this HBO treatment in accordance with Medicare guidelines. A trained emergency response team is readily available per Yes hospital policies and procedures. Continue HBOT as ordered. Yes Electronic Signature(s) Signed: 06/11/2020 5:45:41 PM By: [name] MD Entered By: [name] on 06/11/2020 15:06:43 -------------------------------------------------------------------------------- HBO Safety Checklist Details Patient Name: Date of Service: [name] 06/11/2020 8:00 A M Medical Record Number: [id number] Patient Account Number: [id number] Date of Birth/Sex: Treating RN: 01/09/1970 (50 y.o. [name] Primary Care Parrish Daddario: [name] Other Clinician: Referring Van Seymore: Treating Cristofher Livecchi/Extender: [name] in Treatment: 9 HBO Safety Checklist Items Safety Checklist Consent Form Signed Patient voided / foley secured and emptied When did you last eato 0700 Last dose of injectable or oral agent na Ostomy pouch emptied and vented if applicable NA All implantable devices assessed, documented and approved Intravenous access site secured and place NA Valuables secured Linens and cotton and cotton/polyester blend (less than 51% polyester) Personal oil-based products / skin lotions / body lotions removed Wigs or hairpieces removed Smoking or tobacco materials removed Books / newspapers / magazines / loose paper removed Cologne, aftershave, perfume and deodorant removed Jewelry removed (may wrap wedding band) Make-up removed Hair care products removed Battery operated devices (external) removed Heating patches and chemical warmers removed Titanium eyewear removed Nail polish cured greater than 10 hours Casting material cured greater than 10 hours NA Hearing aids removed NA Loose dentures or partials removed Prosthetics have been removed NA Patient  demonstrates correct use of air break device (if applicable) Patient concerns have been addressed Patient grounding bracelet on and cord attached to chamber Specifics for Inpatients (complete in addition to above) Medication sheet sent with patient NA Intravenous medications needed or due  during therapy sent with patient NA Drainage tubes (e.g. nasogastric tube or chest tube secured and vented) NA Endotracheal or Tracheotomy tube secured NA Cuff deflated of air and inflated with saline NA Airway suctioned NA Electronic Signature(s) Signed: 06/12/2020 5:57:05 PM By: Levan Hurst RN, BSN Entered By: Levan Hurst on 06/11/2020 08:30:17

## 2020-06-12 NOTE — Progress Notes (Signed)
BRIAN, KOCOUREK ([id number]) Visit Report for 06/12/2020 SuperBill Details Patient Name: Date of Service: [name] 06/12/2020 Medical Record Number: [id number] Patient Account Number: [id number] Date of Birth/Sex: Treating RN: [birth year] (50 y.o. 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 Disruption of external operation (surgical) wound, not elsewhere classified, subsequent encounter 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 Facility Procedures CPT4 Code Description Modifier Quantity [id number] G0277-(Facility Use Only) HBOT full body chamber, 72min , 4 Physician Procedures Quantity CPT4 Code Description Modifier [id number] 24497 - WC PHYS HYPERBARIC OXYGEN THERAPY 1 ICD-10 Diagnosis Description 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 Electronic Signature(s) Signed: 06/12/2020 5:40:25 PM By: Worthy Keeler PA-C Signed: 06/12/2020 6:05:11 PM By: Deon Pilling Entered By: Deon Pilling on 06/12/2020 15:19:16

## 2020-06-12 NOTE — Progress Notes (Signed)
[name], [name] ([id number]) Visit Report for 06/11/2020 Arrival Information Details Patient Name: Date of Service: [name] [name] 06/11/2020 8:00 Sarah M Medical Record Number: [id number] Patient Account Number: [id number] Date of Birth/Sex: Treating RN: 1970/04/18 (50 y.o. [name], [name] Primary Care [name]: [name] Other Clinician: Referring [name]: Treating [name]/Extender: [name] in Treatment: 9 Visit Information History Since Last Visit Added or deleted any medications: No Patient Arrived: Ambulatory Any new allergies or adverse reactions: No Arrival Time: 07:55 Had Sarah fall or experienced change in No Accompanied By: alone activities of daily living that may affect Transfer Assistance: None risk of falls: Patient Identification Verified: Yes Signs or symptoms of abuse/neglect since last visito No Secondary Verification Process Completed: Yes Hospitalized since last visit: No Patient Requires Transmission-Based Precautions: No Implantable device outside of the clinic excluding No Patient Has Alerts: No cellular tissue based products placed in the center since last visit: Has Dressing in Place as Prescribed: Yes Pain Present Now: No Electronic Signature(s) Signed: 06/12/2020 5:57:05 PM By: Sarah Hurst RN, BSN Entered By: [name] on 06/11/2020 08:29:10 -------------------------------------------------------------------------------- Encounter Discharge Information Details Patient Name: Date of Service: Sarah Crochet V [name] 06/11/2020 8:00 Sarah M Medical Record Number: [id number] Patient Account Number: [id number] Date of Birth/Sex: Treating RN: 1970-01-27 (50 y.o. [name] Primary Care [name]: [name] Other Clinician: Referring [name]: Treating [name]/Extender: [name] in Treatment: 9 Encounter Discharge Information Items Discharge Condition:  Stable Ambulatory Status: Ambulatory Discharge Destination: Home Transportation: Private Auto Accompanied By: alone Schedule Follow-up Appointment: Yes Clinical Summary of Care: Patient Declined Electronic Signature(s) Signed: 06/12/2020 5:57:05 PM By: Sarah Hurst RN, BSN Entered By: [name] on 06/11/2020 12:23:59 -------------------------------------------------------------------------------- Vitals Details Patient Name: Date of Service: Sarah Crochet V [name] 06/11/2020 8:00 Sarah M Medical Record Number: [id number] Patient Account Number: [id number] Date of Birth/Sex: Treating RN: 1970/02/22 (50 y.o. [name] Primary Care [name]: [name] Other Clinician: Referring [name]: Treating Rein Popov/Extender: [name] in Treatment: 9 Vital Signs Time Taken: 07:55 Temperature (F): 97.9 Height (in): 67 Pulse (bpm): 86 Weight (lbs): 257 Respiratory Rate (breaths/min): 20 Body Mass Index (BMI): 40.2 Blood Pressure (mmHg): 115/81 Reference Range: 80 - 120 mg / dl Electronic Signature(s) Signed: 06/12/2020 5:57:05 PM By: Sarah Hurst RN, BSN Entered By: [name] on 06/11/2020 08:29:43

## 2020-06-12 NOTE — Progress Notes (Signed)
Sarah, LACSON ([id number]) Visit Report for 06/07/2020 HBO Details Patient Name: Date of Service: [name] V [name] 06/07/2020 8:00 A M Medical Record Number: [id number] Patient Account Number: [id number] Date of Birth/Sex: Treating RN: 1970-10-16 (50 y.o. 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 HBO Treatment Details Treatment Number: 37 Patient Type: Outpatient Chamber Type: Monoplace Chamber Serial #: [id number] Treatment Protocol: 2.5 ATA with 90 minutes oxygen, with two 5 minute air breaks Treatment Details Compression Rate Down: 2.0 psi / minute De-Compression Rate Up: 2.5 psi / minute A breaks and breathing ir Compress Tx Pressure periods Decompress Decompress Begins Reached (leave unused spaces Begins Ends blank) Chamber Pressure (ATA 1 2.5 2.5 2.5 2.5 2.5 - - 2.5 1 ) Clock Time (24 hr) 08:07 08:19 08:49 08:54 09:24 09:29 - - 09:50 10:00 Treatment Length: 113 (minutes) Treatment Segments: 4 Vital Signs Capillary Blood Glucose Reference Range: 80 - 120 mg / dl HBO Diabetic Blood Glucose Intervention Range: <131 mg/dl or >249 mg/dl Time Vitals Blood Respiratory Capillary Blood Glucose Pulse Action Type: Pulse: Temperature: Taken: Pressure: Rate: Glucose (mg/dl): Meter #: Oximetry (%) Taken: Pre 07:59 121/70 76 18 97.9 Post 10:00 124/78 79 18 97.7 Treatment Response Adverse Events: 1:Nausea / Vomiting Treatment Completion Status: Treatment Aborted/Not Restarted Reason: Adverse Event Treatment Notes Patient asked to be decompressed early due to sudden onset nausea. Patient stable after decompression, no  vomiting, nausea subsided prior to discharge. Coty Student Notes Patient complained of nausea just before ascent . otherwise stable Physician HBO Attestation: I certify that I supervised this HBO treatment in accordance with Medicare guidelines. A trained emergency response team is readily available per Yes hospital policies and procedures. Continue HBOT as ordered. Yes Electronic Signature(s) Signed: 06/08/2020 7:13:41 AM By: [name] MD Entered By: [name] on 06/07/2020 21:26:18 -------------------------------------------------------------------------------- HBO Safety Checklist Details Patient Name: Date of Service: [name] V [name] 06/07/2020 8:00 A M Medical Record Number: [id number] Patient Account Number: [id number] Date of Birth/Sex: Treating RN: Nov 11, 1970 (50 y.o. [name] Primary Care Leata Dominy: [name] Other Clinician: Referring Favio Moder: Treating Ledarrius Beauchaine/Extender: [name] in Treatment: 8 HBO Safety Checklist Items Safety Checklist Consent Form Signed Patient voided / foley secured and emptied When did you last eato 0700 Last dose of injectable or oral agent na Ostomy pouch emptied and vented if applicable NA All implantable devices assessed, documented and approved Intravenous access site secured and place NA Valuables secured Linens and cotton and cotton/polyester blend (less than 51% polyester) Personal oil-based products / skin lotions / body lotions removed Wigs or hairpieces removed Smoking or tobacco materials removed Books / newspapers / magazines / loose paper removed Cologne, aftershave, perfume and deodorant removed Jewelry removed (may wrap wedding band) Make-up removed Hair care products removed Battery operated devices (external) removed Heating patches and chemical warmers removed Titanium eyewear removed Nail polish cured greater than 10 hours Casting material cured greater than  10 hours NA Hearing aids removed NA Loose dentures or partials removed Prosthetics have been removed NA Patient demonstrates correct use of air break device (if applicable) Patient concerns have been addressed Patient grounding bracelet on and cord attached to chamber Specifics for Inpatients (complete  in addition to above) Medication sheet sent with patient NA Intravenous medications needed or due during therapy sent with patient NA Drainage tubes (e.g. nasogastric tube or chest tube secured and vented) NA Endotracheal or Tracheotomy tube secured NA Cuff deflated of air and inflated with saline NA Airway suctioned NA Electronic Signature(s) Signed: 06/12/2020 5:57:05 PM By: Levan Hurst RN, BSN Entered By: Levan Hurst on 06/07/2020 08:59:17

## 2020-06-12 NOTE — Progress Notes (Signed)
[name], [name] ([id number]) Visit Report for 06/07/2020 Arrival Information Details Patient Name: Date of Service: [name] Sarah D. 06/07/2020 8:00 Sarah M Medical Record Number: [id number] Patient Account Number: [id number] Date of Birth/Sex: Treating RN: [birth year] (50 y.o. [name], [name] Primary Care [name]: [name] Other Clinician: Referring [name]: Treating [name]/Extender: [name] in Treatment: 8 Visit Information History Since Last Visit Added or deleted any medications: No Patient Arrived: Ambulatory Any new allergies or adverse reactions: No Arrival Time: 07:59 Had Sarah fall or experienced change in No Accompanied By: alone activities of daily living that may affect Transfer Assistance: None risk of falls: Patient Identification Verified: Yes Signs or symptoms of abuse/neglect since last visito No Secondary Verification Process Completed: Yes Hospitalized since last visit: No Patient Requires Transmission-Based Precautions: No Implantable device outside of the clinic excluding No Patient Has Alerts: No cellular tissue based products placed in the center since last visit: Has Dressing in Place as Prescribed: Yes Pain Present Now: No Electronic Signature(s) Signed: 06/12/2020 5:57:05 PM By: Sarah Hurst RN, BSN Entered By: [name] on 06/07/2020 08:58:16 -------------------------------------------------------------------------------- Vitals Details Patient Name: Date of Service: [name] Sarah D. 06/07/2020 8:00 Sarah M Medical Record Number: [id number] Patient Account Number: [id number] Date of Birth/Sex: Treating RN: 07-02-1970 (50 y.o. [name] Primary Care [name]: [name] Other Clinician: Referring [name]: Treating [name]/Extender: [name] in Treatment: 8 Vital Signs Time Taken: 07:59 Temperature (F): 97.9 Height (in): 67 Pulse (bpm): 76 Weight  (lbs): 257 Respiratory Rate (breaths/min): 18 Body Mass Index (BMI): 40.2 Blood Pressure (mmHg): 121/70 Reference Range: 80 - 120 mg / dl Electronic Signature(s) Signed: 06/12/2020 5:57:05 PM By: Sarah Hurst RN, BSN Entered By: [name] on 06/07/2020 08:58:31

## 2020-06-12 NOTE — Progress Notes (Signed)
[name], [name] ([id number]) Visit Report for 06/11/2020 SuperBill Details Patient Name: Date of Service: [name] [name] 06/11/2020 Medical Record Number: [id number] Patient Account Number: [id number] Date of Birth/Sex: Treating RN: 07-07-1970 (50 y.o. 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

## 2020-06-12 NOTE — Progress Notes (Addendum)
MEERA, VASCO ([id number]) Visit Report for 06/07/2020 SuperBill Details Patient Name: Date of Service: [name] 06/07/2020 Medical Record Number: [id number] Patient Account Number: [id number] Date of Birth/Sex: Treating RN: 1970/12/21 (50 y.o. 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 Facility Procedures CPT4 Code Description Modifier Quantity [id number] G0277-(Facility Use Only) HBOT full body chamber, 12min , 4 Physician Procedures Quantity CPT4 Code Description Modifier [id number] 41962 - WC PHYS HYPERBARIC OXYGEN THERAPY 1 ICD-10 Diagnosis Description 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

## 2020-06-12 NOTE — Progress Notes (Signed)
[name], [name] ([id number]) Visit Report for 06/12/2020 HBO Details Patient Name: Date of Service: [name] 06/12/2020 8:00 A M Medical Record Number: [id number] Patient Account Number: [id number] Date of Birth/Sex: Treating RN: [birth year] (50 y.o. 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5 1 ) Clock Time (24 hr) 08:08 08:20 08:50 08:55 09:25 09:30 - - 10:00 10:10 Treatment Length: 122 (minutes) Treatment Segments: 4 Vital Signs Capillary Blood Glucose Reference Range: 80 - 120 mg / dl HBO Diabetic Blood Glucose Intervention Range: <131 mg/dl or >249 mg/dl Time Vitals Blood Respiratory Capillary Blood Glucose Pulse Action Type: Pulse: Temperature: Taken: Pressure: Rate: Glucose (mg/dl): Meter #: Oximetry (%) Taken: Pre 08:00 123/79 91 20 97.5 Post 10:12 122/82 76 20 97.7 Treatment Response Treatment Toleration: Well Treatment Completion Status: Treatment Completed without Adverse Event Electronic Signature(s) Signed:  06/12/2020 5:40:25 PM By: Worthy Keeler PA-C Signed: 06/12/2020 6:05:11 PM By: Deon Pilling Entered By: Deon Pilling on 06/12/2020 15:19:05 -------------------------------------------------------------------------------- HBO Safety Checklist Details Patient Name: Date of Service: [name] 06/12/2020 8:00 A M Medical Record Number: [id number] Patient Account Number: [id number] Date of Birth/Sex: Treating RN: [birth year] (50 y.o. [name], [name] Primary Care [name]: Other Clinician: [name] Referring Branston Halsted: Treating Jaeleah Smyser/Extender: Randon Goldsmith in Treatment: 9 HBO Safety Checklist Items Safety Checklist Consent Form Signed Patient voided / foley secured and emptied When did you last eato this morning crackers Last dose of injectable or oral agent n/a Ostomy pouch emptied and vented if applicable NA All implantable devices assessed, documented and approved NA Intravenous access site secured and place NA Valuables secured Linens and cotton and cotton/polyester blend (less than 51% polyester) Personal oil-based products / skin lotions / body lotions removed NA Wigs or hairpieces removed NA Smoking or tobacco materials removed NA Books / newspapers / magazines / loose paper removed NA Cologne, aftershave, perfume and deodorant removed NA Jewelry removed (may wrap wedding band) Make-up removed NA Hair care products removed NA Battery operated devices (external) removed NA Heating patches and chemical warmers removed NA Titanium eyewear removed Nail polish cured greater than 10 hours Casting material cured greater than 10 hours NA Hearing aids removed NA Loose dentures or partials removed NA Prosthetics have been removed NA Patient demonstrates correct use of air break device (if applicable) Patient concerns have been addressed Patient grounding bracelet on and cord attached to chamber Specifics for  Inpatients (complete in addition to above) Medication sheet sent with patient Intravenous medications needed or due during therapy sent with patient Drainage tubes (e.g. nasogastric tube or chest tube secured and vented) Endotracheal or Tracheotomy tube secured Cuff  deflated of air and inflated with saline Airway suctioned Electronic Signature(s) Signed: 06/12/2020 6:05:11 PM By: Deon Pilling Entered By: Deon Pilling on 06/12/2020 15:17:29

## 2020-06-12 NOTE — Progress Notes (Signed)
[name], [name] ([id number]) Visit Report for 06/06/2020 HBO Details Patient Name: Date of Service: [name] V [name] 06/06/2020 8:00 A M Medical Record Number: [id number] Patient Account Number: [id number] Date of Birth/Sex: Treating RN: 04-May-1970 (50 y.o. 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 HBO Treatment Details Treatment Number: 36 Patient Type: Outpatient Chamber Type: Monoplace Chamber Serial #: [id number] Treatment Protocol: 2.5 ATA with 90 minutes oxygen, with two 5 minute air breaks Treatment Details Compression Rate Down: 2.0 psi / minute De-Compression Rate Up: 2.5 psi / minute A breaks and breathing ir Compress Tx Pressure periods Decompress Decompress Begins Reached (leave unused spaces Begins Ends blank) Chamber Pressure (ATA 1 2.5 2.5 2.5 2.5 2.5 - - 2.5 1 ) Clock Time (24 hr) 07:54 08:06 08:36 08:41 09:11 09:16 - - 09:46 09:56 Treatment Length: 122 (minutes) Treatment Segments: 4 Vital Signs Capillary Blood Glucose Reference Range: 80 - 120 mg / dl HBO Diabetic Blood Glucose Intervention Range: <131 mg/dl or >249 mg/dl Time Vitals Blood Respiratory Capillary Blood Glucose Pulse Action Type: Pulse: Temperature: Taken: Pressure: Rate: Glucose (mg/dl): Meter #: Oximetry (%) Taken: Pre 07:51 130/78 83 18 98.2 Post 09:56 124/85 73 18 97.9 Treatment Response Treatment Completion Status: Treatment Completed without Adverse Event Britlyn Martine Notes No concerns with treatment given Physician HBO Attestation: I certify that I supervised this HBO treatment in accordance with Medicare guidelines. A trained  emergency response team is readily available per Yes hospital policies and procedures. Continue HBOT as ordered. Yes Electronic Signature(s) Signed: 06/06/2020 4:50:31 PM By: [name] MD Entered By: [name] on 06/06/2020 14:11:52 -------------------------------------------------------------------------------- HBO Safety Checklist Details Patient Name: Date of Service: [name] V [name] 06/06/2020 8:00 A M Medical Record Number: [id number] Patient Account Number: [id number] Date of Birth/Sex: Treating RN: 03-Oct-1970 (50 y.o. [name] Primary Care Dianah Pruett: [name] Other Clinician: Referring Wille Aubuchon: Treating Jorgia Manthei/Extender: [name] in Treatment: 8 HBO Safety Checklist Items Safety Checklist Consent Form Signed Patient voided / foley secured and emptied When did you last eato 0630 Last dose of injectable or oral agent na Ostomy pouch emptied and vented if applicable NA All implantable devices assessed, documented and approved Intravenous access site secured and place NA Valuables secured Linens and cotton and cotton/polyester blend (less than 51% polyester) Personal oil-based products / skin lotions / body lotions removed Wigs or hairpieces removed Smoking or tobacco materials removed Books / newspapers / magazines / loose paper removed Cologne, aftershave, perfume and deodorant removed Jewelry removed (may wrap wedding band) Make-up removed Hair care products removed Battery operated devices (external) removed Heating patches and chemical warmers removed Titanium eyewear removed Nail polish cured greater than 10 hours Casting material cured greater than 10 hours NA Hearing aids removed NA Loose dentures or partials removed Prosthetics have been removed NA Patient demonstrates correct use of air break device (if applicable) Patient concerns have been addressed Patient grounding bracelet on and  cord attached to chamber Specifics for Inpatients (complete in addition to above) Medication sheet sent with patient NA Intravenous medications needed or due during therapy sent with patient NA Drainage tubes (e.g. nasogastric tube or chest tube secured and vented) NA Endotracheal or  Tracheotomy tube secured NA Cuff deflated of air and inflated with saline NA Airway suctioned NA Electronic Signature(s) Signed: 06/12/2020 5:57:05 PM By: Levan Hurst RN, BSN Entered By: Levan Hurst on 06/06/2020 08:53:32

## 2020-06-14 ENCOUNTER — Other Ambulatory Visit: Payer: Self-pay | Admitting: Oncology

## 2020-06-14 ENCOUNTER — Other Ambulatory Visit (HOSPITAL_COMMUNITY): Payer: Self-pay

## 2020-06-14 NOTE — Telephone Encounter (Signed)
Refilled already. [name] Rhyme, RN

## 2020-06-17 ENCOUNTER — Other Ambulatory Visit (HOSPITAL_COMMUNITY): Payer: Self-pay

## 2020-06-17 ENCOUNTER — Telehealth: Payer: Self-pay

## 2020-06-17 NOTE — Telephone Encounter (Signed)
Pt called today to find out when she can start olaparib as she has finished hyperbaric therapy; however, she is having surgery 07/15/20 and would like to know how to proceed. Previous message forwarded to Mendel Ryder, NP. Pt is aware and verbalizes thanks.

## 2020-06-18 ENCOUNTER — Other Ambulatory Visit: Payer: Self-pay

## 2020-06-18 ENCOUNTER — Other Ambulatory Visit (HOSPITAL_COMMUNITY): Payer: Self-pay

## 2020-06-18 ENCOUNTER — Encounter (HOSPITAL_BASED_OUTPATIENT_CLINIC_OR_DEPARTMENT_OTHER): Payer: Medicaid Other | Admitting: Internal Medicine

## 2020-06-18 ENCOUNTER — Other Ambulatory Visit: Payer: Self-pay | Admitting: Hematology and Oncology

## 2020-06-18 DIAGNOSIS — L59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: Secondary | ICD-10-CM | POA: Diagnosis not present

## 2020-06-18 MED ORDER — TRAMADOL HCL 50 MG PO TABS
ORAL_TABLET | Freq: Four times a day (QID) | ORAL | 0 refills | Status: DC | PRN
  Filled 2020-06-18: qty 30, 5d supply, fill #0

## 2020-06-19 ENCOUNTER — Other Ambulatory Visit: Payer: Self-pay | Admitting: Oncology

## 2020-06-19 ENCOUNTER — Other Ambulatory Visit (HOSPITAL_COMMUNITY): Payer: Self-pay

## 2020-06-19 ENCOUNTER — Telehealth: Payer: Self-pay | Admitting: *Deleted

## 2020-06-19 NOTE — Telephone Encounter (Signed)
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  Zyasia states " at the end of the day I am presently wanting quality of life over quantity "  This RN validated her statements with supporting the difficulty of her delayed wound healing and how much it has interfered with her life.  Per end of discussion- Naimah plans on not starting the olaparib now- but if she has further concerns she understands to call.  Appt for lab and flush on 7/5 will be kept - and she will ask about any labs needed pre surgery that can be drawn at this time as well.  Appt with MD will be rescheduled until post surgery - early August.  No further questions at this time.

## 2020-06-19 NOTE — Progress Notes (Signed)
[name], [name] ([id number]) Visit Report for 06/18/2020 Arrival Information Details Patient Name: Date of Service: Sarah [name] [name] 06/18/2020 7:30 Sarah M Medical Record Number: [id number] Patient Account Number: [id number] Date of Birth/Sex: Treating RN: 05/03/1970 (50 y.o. [name], [name] Primary Care Rynell Ciotti: [name] Other Clinician: Referring Janeshia Ciliberto: Treating Leahann Lempke/Extender: [name] in Treatment: 10 Visit Information History Since Last Visit Added or deleted any medications: No Patient Arrived: Ambulatory Any new allergies or adverse reactions: No Arrival Time: 07:44 Had Sarah fall or experienced change in No Accompanied By: husband activities of daily living that may affect Transfer Assistance: None risk of falls: Patient Identification Verified: Yes Signs or symptoms of abuse/neglect since last visito No Secondary Verification Process Completed: Yes Hospitalized since last visit: No Patient Requires Transmission-Based Precautions: No Implantable device outside of the clinic excluding No Patient Has Alerts: No cellular tissue based products placed in the center since last visit: Has Dressing in Place as Prescribed: Yes Pain Present Now: No Electronic Signature(s) Signed: 06/18/2020 8:11:55 AM By: Sandre Kitty Entered By: Sandre Kitty on 06/18/2020 07:45:48 -------------------------------------------------------------------------------- Clinic Level of Care Assessment Details Patient Name: Date of Service: Sarah BDELA Shayne Alken V [name] 06/18/2020 7:30 Sarah M Medical Record Number: [id number] Patient Account Number: [id number] Date of Birth/Sex: Treating RN: Sep 25, 1970 (50 y.o. [name], [name] Primary Care Pasha Broad: [name] Other Clinician: Referring Brylin Stopper: Treating Natesha Hassey/Extender: [name] in Treatment: 10 Clinic Level of Care Assessment Items TOOL 4 Quantity Score X- 1  0 Use when only an EandM is performed on FOLLOW-UP visit ASSESSMENTS - Nursing Assessment / Reassessment X- 1 10 Reassessment of Co-morbidities (includes updates in patient status) X- 1 5 Reassessment of Adherence to Treatment Plan ASSESSMENTS - Wound and Skin Sarah ssessment / Reassessment X - Simple Wound Assessment / Reassessment - one wound 1 5 _0  - 0 Complex Wound Assessment / Reassessment - multiple wounds X- 1 10 Dermatologic / Skin Assessment (not related to wound area) ASSESSMENTS - Focused Assessment _1  - 0 Circumferential Edema Measurements - multi extremities X- 1 10 Nutritional Assessment / Counseling / Intervention _2  - 0 Lower Extremity Assessment (monofilament, tuning fork, pulses) _3  - 0 Peripheral Arterial Disease Assessment (using hand held doppler) ASSESSMENTS - Ostomy and/or Continence Assessment and Care _4  - 0 Incontinence Assessment and Management _5  - 0 Ostomy Care Assessment and Management (repouching, etc.) PROCESS - Coordination of Care X - Simple Patient / Family Education for ongoing care 1 15 _6  - 0 Complex (extensive) Patient / Family Education for ongoing care X- 1 10 Staff obtains Programmer, systems, Records, T Results / Process Orders est _7  - 0 Staff telephones HHA, Nursing Homes / Clarify orders / etc _8  - 0 Routine Transfer to another Facility (non-emergent condition) _9  - 0 Routine Hospital Admission (non-emergent condition) _10  - 0 New Admissions / Biomedical engineer / Ordering NPWT Apligraf, etc. , _11  - 0 Emergency Hospital Admission (emergent condition) X- 1 10 Simple Discharge Coordination _12  - 0 Complex (extensive) Discharge Coordination PROCESS - Special Needs _13  - 0 Pediatric / Minor Patient Management _14  - 0 Isolation Patient Management _15  - 0 Hearing / Language / Visual special needs _16  - 0 Assessment of Community assistance (transportation, D/C planning, etc.) _17  - 0 Additional assistance / Altered mentation _18  -  0 Support Surface(s) Assessment (bed, cushion, seat, etc.) INTERVENTIONS - Wound Cleansing / Measurement X - Simple Wound Cleansing - one wound 1 5 _19  -  0 Complex Wound Cleansing - multiple wounds X- 1 5 Wound Imaging (photographs - any number of wounds) _0  - 0 Wound Tracing (instead of photographs) X- 1 5 Simple Wound Measurement - one wound _1  - 0 Complex Wound Measurement - multiple wounds INTERVENTIONS - Wound Dressings _2  - 0 Small Wound Dressing one or multiple wounds X- 1 15 Medium Wound Dressing one or multiple wounds _3  - 0 Large Wound Dressing one or multiple wounds <BJSEGBTDVVOHYWVP>_7<\/TGGYIRSWNIOEVOJJ>_0  - 0 Application of Medications - topical <KXFGHWEXHBZJIRCV>_8<\/LFYBOFBPZWCHENID>_7  - 0 Application of Medications - injection INTERVENTIONS - Miscellaneous _6  - 0 External ear exam _7  - 0 Specimen Collection (cultures, biopsies, blood, body fluids, etc.) _8  - 0 Specimen(s) / Culture(s) sent or taken to Lab for analysis _9  - 0 Patient Transfer (multiple staff / Civil Service fast streamer / Similar devices) _10  - 0 Simple Staple / Suture removal (25 or less) _11  - 0 Complex Staple / Suture removal (26 or more) _12  - 0 Hypo / Hyperglycemic Management (close monitor of Blood Glucose) _13  - 0 Ankle / Brachial Index (ABI) - do not check if billed separately X- 1 5 Vital Signs Has the patient been seen at the hospital within the last three years: Yes Total Score: 110 Level Of Care: New/Established - Level 3 Electronic Signature(s) Signed: 06/18/2020 6:05:55 PM By: Deon Pilling Entered By: Deon Pilling on 06/18/2020 12:54:49 -------------------------------------------------------------------------------- Encounter Discharge Information Details Patient Name: Date of Service: [name] 06/18/2020 7:30 Sarah M Medical Record Number: [id number] Patient Account Number: [id number] Date of Birth/Sex: Treating RN: 10-Jul-1970 (50 y.o. [name] Primary Care Brain Honeycutt: [name] Other Clinician: Referring Quinnton Bury: Treating  Kollin Udell/Extender: [name] in Treatment: 10 Encounter Discharge Information Items Discharge Condition: Stable Ambulatory Status: Ambulatory Discharge Destination: Home Transportation: Private Auto Accompanied ByRaechel Chute Schedule Follow-up Appointment: Yes Clinical Summary of Care: Provided on 06/18/2020 Form Type Recipient Paper Patient Patient Electronic Signature(s) Signed: 06/18/2020 8:19:01 AM By: Lorrin Jackson Entered By: Lorrin Jackson on 06/18/2020 08:19:01 -------------------------------------------------------------------------------- Lower Extremity Assessment Details Patient Name: Date of Service: [name] 06/18/2020 7:30 Sarah M Medical Record Number: [id number] Patient Account Number: [id number] Date of Birth/Sex: Treating RN: [birth year] (50 y.o. [name] Primary Care Khalfani Weideman: [name] Other Clinician: Referring Holle Sprick: Treating Lonette Stevison/Extender: [name] in Treatment: 10 Electronic Signature(s) Signed: 06/18/2020 7:47:43 AM By: Lorrin Jackson Entered By: Lorrin Jackson on 06/18/2020 07:47:43 -------------------------------------------------------------------------------- Multi Wound Chart Details Patient Name: Date of Service: [name] 06/18/2020 7:30 Sarah M Medical Record Number: [id number] Patient Account Number: [id number] Date of Birth/Sex: Treating RN: [birth year] (50 y.o. 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)] [N/Sarah:N/Sarah] Secondary Etiology: [1:Hypertension, Osteoarthritis, ReceivedN/Sarah] Comorbid History: [1:Chemotherapy, Received Radiation 01/16/2020] [N/Sarah:N/Sarah] Date Acquired: [1:10] [N/Sarah:N/Sarah] Weeks of Treatment: [1:Open] [N/Sarah:N/Sarah] Wound Status: [1:4.7x4.6x1.3] [N/Sarah:N/Sarah] Measurements L x W x D (cm) [1:16.98] [N/Sarah:N/Sarah] Sarah (cm) : rea [1:22.074] [N/Sarah:N/Sarah] Volume (cm) : [1:-166.90%] [N/Sarah:N/Sarah] % Reduction in Sarah rea: [1:-215.40%] [N/Sarah:N/Sarah] % Reduction in Volume: [1:11] Position 1 (o'clock): [1:1.4] Maximum Distance 1 (cm): [1:2] Position 2 (o'clock): [1:2.2] Maximum Distance 2 (  cm): [1:Yes] [N/Sarah:N/Sarah] Tunneling: [1:Full Thickness Without Exposed] [N/Sarah:N/Sarah] Classification: [1:Support Structures Medium] [N/Sarah:N/Sarah] Exudate Amount: [1:Serosanguineous] [N/Sarah:N/Sarah] Exudate Type: [1:red, brown] [N/Sarah:N/Sarah] Exudate Color: [1:Well defined, not attached] [N/Sarah:N/Sarah] Wound Margin: [1:Medium (34-66%)] [N/Sarah:N/Sarah] Granulation Amount: [1:Red] [N/Sarah:N/Sarah] Granulation Quality: [1:Medium (34-66%)] [N/Sarah:N/Sarah] Necrotic Amount: [1:Fat Layer (Subcutaneous Tissue): Yes N/Sarah] Exposed Structures: [1:Fascia: No Tendon: No Muscle: No Joint: No Bone: No Small (1-33%)] [N/Sarah:N/Sarah] Treatment Notes Wound #1 (Chest) Wound Laterality: Left Cleanser Normal Saline Discharge Instruction: Cleanse the wound with Normal Saline prior to applying Sarah clean dressing using gauze sponges, not tissue or cotton balls. Peri-Wound Care Topical Primary Dressing Promogran Prisma Matrix, 4.34 (sq in) (silver collagen) Discharge Instruction: Moisten collagen with saline or hydrogel Secondary Dressing Zetuvit Plus 4x4 in Discharge Instruction: Apply over primary dressing as directed. Secured With Compression Wrap Compression Stockings Add-Ons Education officer, community) Signed: 06/18/2020 4:29:11 PM By: [name] MD Signed: 06/19/2020 6:22:58 PM By: Rhae Hammock RN Entered By: [name] on 06/18/2020 08:22:02 -------------------------------------------------------------------------------- Multi-Disciplinary Care Plan Details Patient Name: Date of Service: [name], [name] 06/18/2020 7:30 Sarah M Medical Record Number: [id number] Patient Account Number: [id number] Date of Birth/Sex: Treating RN: 1970-10-31 (50 y.o. [name], [name] Primary Care Jawanna Dykman: [name] Other Clinician: Referring Keneisha Heckart: Treating Keilyn Haggard/Extender: [name] in Treatment: 10 Active Inactive Wound/Skin Impairment Nursing Diagnoses: Impaired tissue integrity Knowledge deficit related to smoking impact on wound healing Knowledge deficit related to ulceration/compromised skin integrity Goals: Patient will demonstrate Sarah reduced rate of smoking or cessation of smoking Date Initiated: 04/09/2020 Date Inactivated: 06/18/2020 Target Resolution Date: 06/28/2020 Goal Status: Met Patient will have Sarah decrease in wound volume by X% from date: (specify in notes) Date Initiated: 04/09/2020 Target Resolution Date: 07/05/2020 Goal Status: Active Patient/caregiver will verbalize understanding of skin care regimen Date Initiated: 04/09/2020 Date Inactivated: 05/14/2020 Target Resolution Date: 05/14/2020 Goal Status: Met Ulcer/skin breakdown will have Sarah volume reduction of 30% by week 4 Date Initiated: 04/09/2020 Date Inactivated: 05/14/2020 Target Resolution Date: 05/11/2020 Unmet Reason: see wound Goal Status: Unmet meaurements. Interventions: Assess patient/caregiver ability to obtain necessary supplies Assess patient/caregiver ability to perform ulcer/skin care regimen upon admission and as needed Assess ulceration(s) every visit Provide education on smoking Notes: Electronic Signature(s) Signed: 06/19/2020 6:22:58 PM By: Rhae Hammock RN Entered By: Rhae Hammock on 06/18/2020  08:06:17 -------------------------------------------------------------------------------- Pain Assessment Details Patient Name: Date of Service: [name] 06/18/2020 7:30 Sarah M Medical Record Number: [id number] Patient Account Number: [id number] Date of Birth/Sex: Treating RN: Mar 15, 1970 (49 y.o. [name], [name] Primary Care Carmino Ocain: [name] Other Clinician: Referring Fionnuala Hemmerich: Treating Hamilton Marinello/Extender: [name] in Treatment: 10 Active Problems Location of Pain Severity and Description of Pain Patient Has Paino No Site Locations Pain Management and Medication Current Pain Management: Electronic Signature(s) Signed: 06/18/2020 8:11:55 AM By: Sandre Kitty Signed: 06/19/2020 6:22:58 PM By: Rhae Hammock RN Entered By: Sandre Kitty on 06/18/2020 07:46:10 -------------------------------------------------------------------------------- Patient/Caregiver Education Details Patient Name: Date of Service: [name] 6/14/2022andnbsp7:30 Sarah M Medical Record Number: [id number] Patient Account Number: [id number] Date of Birth/Gender: Treating RN: [birth year] (50 y.o. Benjaman Lobe Primary Care Physician: [name] Other Clinician: Referring Physician: Treating Physician/Extender: [name] in Treatment: 10 Education Assessment Education Provided To: Patient Education Topics Provided Wound/Skin Impairment: Methods: Explain/Verbal Responses: State content correctly [employer]) Signed: 06/19/2020 6:22:58 PM By: Rhae Hammock RN Entered By: Rhae Hammock on  06/18/2020 08:06:29 -------------------------------------------------------------------------------- Wound Assessment Details Patient Name: Date of Service: Sarah BDELA [name] 06/18/2020 7:30 Sarah M Medical Record Number: [id number] Patient Account Number: [id number] Date of  Birth/Sex: Treating RN: [birth year] (50 y.o. [name] Primary Care Emanii Bugbee: [name] Other Clinician: Referring Genice Kimberlin: Treating Cheskel Silverio/Extender: [name] in Treatment: 10 Wound Status Wound Number: 1 Primary Open Surgical Wound Etiology: Wound Location: Left Chest Secondary Necrosis (Radiation) Wounding Event: Surgical Injury Etiology: Date Acquired: 01/16/2020 Wound Status: Open Weeks Of Treatment: 10 Comorbid Hypertension, Osteoarthritis, Received Chemotherapy, Clustered Wound: No History: Received Radiation Photos Wound Measurements Length: (cm) 4.7 Width: (cm) 4.6 Depth: (cm) 1.3 Area: (cm) 16.98 Volume: (cm) 22.074 % Reduction in Area: -166.9% % Reduction in Volume: -215.4% Epithelialization: Small (1-33%) Tunneling: Yes Location 1 Position (o'clock): 11 Maximum Distance: (cm) 1.4 Location 2 Position (o'clock): 2 Maximum Distance: (cm) 2.2 Undermining: No Wound Description Classification: Full Thickness Without Exposed Support Structu Wound Margin: Well defined, not attached Exudate Amount: Medium Exudate Type: Serosanguineous Exudate Color: red, brown res Foul Odor After Cleansing: No Slough/Fibrino Yes Wound Bed Granulation Amount: Medium (34-66%) Exposed Structure Granulation Quality: Red Fascia Exposed: No Necrotic Amount: Medium (34-66%) Fat Layer (Subcutaneous Tissue) Exposed: Yes Necrotic Quality: Adherent Slough Tendon Exposed: No Muscle Exposed: No Joint Exposed: No Bone Exposed: No Treatment Notes Wound #1 (Chest) Wound Laterality: Left Cleanser Normal Saline Discharge Instruction: Cleanse the wound with Normal Saline prior to applying Sarah clean dressing using gauze sponges, not tissue or cotton balls. Peri-Wound Care Topical Primary Dressing Promogran Prisma Matrix, 4.34 (sq in) (silver collagen) Discharge Instruction: Moisten collagen with saline or hydrogel Secondary  Dressing Zetuvit Plus 4x4 in Discharge Instruction: Apply over primary dressing as directed. Secured With Compression Wrap Compression Stockings Add-Ons Education officer, community) Signed: 06/18/2020 1:59:21 PM By: Lorrin Jackson Signed: 06/18/2020 4:42:14 PM By: Sandre Kitty Entered By: Sandre Kitty on 06/18/2020 12:37:14 -------------------------------------------------------------------------------- Vitals Details Patient Name: Date of Service: [name] 06/18/2020 7:30 Sarah M Medical Record Number: [id number] Patient Account Number: [id number] Date of Birth/Sex: Treating RN: [birth year] (50 y.o. [name], [name] Primary Care Bebe Moncure: [name] Other Clinician: Referring Markas Aldredge: Treating Nassir Neidert/Extender: [name] in Treatment: 10 Vital Signs Time Taken: 07:45 Temperature (F): 98.2 Height (in): 67 Pulse (bpm): 83 Weight (lbs): 257 Respiratory Rate (breaths/min): 20 Body Mass Index (BMI): 40.2 Blood Pressure (mmHg): 138/87 Reference Range: 80 - 120 mg / dl Electronic Signature(s) Signed: 06/18/2020 8:11:55 AM By: Sandre Kitty Entered By: Sandre Kitty on 06/18/2020 07:46:03

## 2020-06-19 NOTE — Progress Notes (Signed)
[name] has been understandably reluctant to start olaparib or do anything that might interfere with wound healing. I asked pharmact to check what data was available re delaying start of therapy or effects on wound healing and am copying that unto the chart for future reference.

## 2020-06-19 NOTE — Progress Notes (Addendum)
[name], [name] ([id number]) Visit Report for 06/18/2020 HPI Details Patient Name: Date of Service: [name] 06/18/2020 7:30 A M Medical Record Number: [id number] Patient Account Number: [id number] Date of Birth/Sex: Treating RN: 1970/03/21 (50 y.o. [name], [name] Primary Care Provider: [name] Other Clinician: Referring Provider: Treating Provider/Extender: [name] in Treatment: 10 History of Present Illness HPI Description: ADMISSION 04/09/2020 This is a 50 year old woman who is history of breast cancer in the left breast goes back to 2018. He was found to have a mass in her left breast. She underwent a left back lumpectomy and then subsequent chemo that she did not tolerate very well. She had seed implants placed but then underwent external beam a radiation in the spring 2019. 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 Her adjunctive radiation was from 03/11/2017 through 04/21/2017. Unfortunately she had a left breast biopsy on 11/24/2019 and it was again positive for an invasive ductal carcinoma grade 3 he is to resume at receptor 80% positive. HER-2 negative progesterone negative. She opted for bilateral mastectomies which were done on 01/09/2020 on the right with no evidence of malignancy on the left stage IIb invasive ductal carcinoma. She again received chemotherapy but she did not tolerate it because of wound dehiscence and low platelet counts according to the patient. Unfortunately her mastectomy wound dehisced on the left breast. She was taken back to the OR by [name] of general surgery on 33/24/22 and she underwent a debridement and subsequent attempt at closure. She had been previously treated with Augmentin for suspected underlying infection. Once again the wound has dehisced in the mid  aspect. It is roughly 1 cm deep there is tunnels both medially and laterally she. She still has sutures in place. Still describes constant pain drainage. She also has a drain still in place that drains about 10 cc/day. Currently she is changing ABD pad several times a day 4/12; patient went back to see [name] who remove most of the stitches and debrided her wound. She has been using silver alginate culture I did last time which was a PCR culture showed Peptostreptococcus, Enterococcus faecalis and Staphylococcus epidermidis which is of questionable significance. She has a larger wound now that the sutures have been removed. There are undermining areas superior medial superior lateral and a deep tunneling area in the inferior part of the wound. There is no obvious purulence. I started her on Augmentin on 04/12/2020 and I will likely extend this for another week AMAZINGLY; she is already been approved for HBO and will start today. [name] Echocardiogram which I reviewed from 2019 showed a normal ejection fraction.. I went over side effects of HBO with the patient and answered any of her questions. Her husband was present 5/3; its been a while since have seen this wound. She went to see [name] about 2-1/2 weeks ago according to the patient he can remove some of the surrounding debris. She will see him again later this week or early next week. She has been using silver alginate. A lot of this is problematic since she has Medicaid which traditionally does not cover wound care supplies although it traditionally does not cover HBO either Her wound actually looks quite a bit better than the last time I saw this better looking surface healthy looking  tissue. She has tunnels at 810 2 in 6 the deepest of these is at 2 at 2.2 cm there is no exposed bone. 5/10; the patient was coming in for nurse visit today but requested that I see the wound because of surface discoloration. She  states the Iodoflex makes the surface of this turned "white". She is not really complaining of undue tenderness. She is changing this every second day. The goal of this had been to get the surface of the wound to a vibrant red color and then potentially look at changing to a silver collagen-based dressing, Puraply, and then perhaps an alternative skin substituteo Derma vest. The patient tells me she has an appoint with her oncologist [name] on the 19th. She apparently think she is going to get another PET scan. Based on this I would like his comment about whether there is possibility of any malignancy in the wound itself or just underneath it. Up to now we have not really thought that was true 5/17; nice improvement in the chest wound. Less depth. We have been using Iodoflex to clean off the wound surface this seems to have done nicely. Patient went to see [name] 2 days ago who has referred her to see Dr. Claudia Desanctis. I certainly agree with the thought the surface of the wound looks a lot better now and I think consideration of plastic surgery is certainly something the patient should discuss. I have talked to the patient about this today and I certainly support a conversation with Dr. Claudia Desanctis. I also wondered about is there any residual cancer in this. [name] apparently did not seem to think so per the patient although I do not see that in the text conversations we have had. I also wonder about a skin substitute if her version of Medicare [[employer]] would cover this. [name] is apparently considering starting her on [place]. Patient is already very scared about the side effects but she will discuss this with [name] on 5/19 5/31; patient's wound about the same in terms of size she still has the tunneling area to the left. Surface of the wound looks a lot better there has been some filling in the granulation I have been included on serial text between Dr. Callie Fielding surgery]  Dr. Ruffin Frederick oncology] and Dr. Para Skeans surgery]. I think there is not a lot of thought that the patient has recurrence of her cancer her MRI was indeterminant but [name] did not feel that there was any reason to think she has a major recurrence and he was not in favor of doing a dedicated breast MRI. Dr. Claudia Desanctis wants to go ahead with a flap closure I did it. I have not reviewed his note but per the patient this is a latissimus dorsi myocutaneous flap or I think a skin flap from her abdomen. I have talked to the patient I think it is time to attempt this. If I can continue hyperbarics surrounding the flap then we will attempt to do this 6/14; patient using Prisma as the primary dressing here. She has completed hyperbarics and did very well. Her wound is responded nicely. The depth of this and the condition of the granulation tissue looks a lot better. She has undermining areas at 2 and 10:00 both of these seem to be better as well. She is scheduled for flap closure on 7/11 by Dr. March Rummage Electronic Signature(s) Signed: 06/18/2020 4:29:11 PM By: [name] MD Entered By: [name] on  06/18/2020 08:23:38 -------------------------------------------------------------------------------- Physical Exam Details Patient Name: Date of Service: [name], [name] 06/18/2020 7:30 A M Medical Record Number: [id number] Patient Account Number: [id number] Date of Birth/Sex: Treating RN: 10-17-1970 (50 y.o. [name], [name] Primary Care Provider: [name] Other Clinician: Referring Provider: Treating Provider/Extender: [name] in Treatment: 10 Constitutional Sitting or standing Blood Pressure is within target range for patient.. Pulse regular and within target range for patient.Marland Kitchen Respirations regular, non-labored and within target range.. Temperature is normal and within the target range for the patient.Marland Kitchen Appears in no  distress. Notes Wound exam; granulation tissue was remarkably better. Everything appears to be healthy there is some mild slough on the surface but I did not think this required debridement at this point. The patient complains of tightness to the left of the wound towards the axilla itself but I feel no particular abnormalities here certainly no evidence of infection, subcutaneous fluid or anything to suggest an abscess. There is no erythema around the wound and the irradiated tissue that I could tell. Electronic Signature(s) Signed: 06/18/2020 4:29:11 PM By: [name] MD Entered By: [name] on 06/18/2020 08:24:49 -------------------------------------------------------------------------------- Physician Orders Details Patient Name: Date of Service: [name] 06/18/2020 7:30 A M Medical Record Number: [id number] Patient Account Number: [id number] Date of Birth/Sex: Treating RN: 02/09/1970 (50 y.o. [name], [name] Primary Care Provider: [name] Other Clinician: Referring Provider: Treating Provider/Extender: [name] in Treatment: 10 Verbal / Phone Orders: No Diagnosis Coding Follow-up Appointments ppointment in 2 weeks. - Dr. Dellia Nims Return A Nurse Visit: - 1 week Other: - Surgery 07/11 with Dr. Claudia Desanctis Bathing/ Shower/ Hygiene May shower with protection but do not get wound dressing(s) wet. Additional Orders / Instructions Follow Nutritious Diet Hyperbaric Oxygen Therapy Evaluate for HBO Therapy - Evaluate for extending HBO therapy. Start date: 07/09/2020. Continue until 07/11 surgery date. Once approved after surgery, pt. to continue HBO. Indication: - soft tissue radionecrosis 2.5 ATA for 90 Minutes with 2 Five (5) Minute A Breaks ir Total Number of Treatments: - 30 One treatments per day (delivered Monday through Friday unless otherwise specified in Special Instructions below): Afrin (Oxymetazoline HCL) 0.05%  nasal spray - 1 spray in both nostrils daily as needed prior to HBO treatment for difficulty clearing ears Wound Treatment Wound #1 - Chest Wound Laterality: Left Cleanser: Normal Saline (Generic) 1 x Per Day/15 Days Discharge Instructions: Cleanse the wound with Normal Saline prior to applying a clean dressing using gauze sponges, not tissue or cotton balls. Prim Dressing: Promogran Prisma Matrix, 4.34 (sq in) (silver collagen) ary 1 x Per Day/15 Days Discharge Instructions: Moisten collagen with saline or hydrogel Secondary Dressing: Zetuvit Plus 4x4 in (Generic) 1 x Per Day/15 Days Discharge Instructions: Apply over primary dressing as directed. Add-Ons: Byram (Generic) 1 x Per Day/15 Days Add-Ons: 1 x Per CBS/49 Days Electronic Signature(s) Signed: 06/28/2020 5:20:40 PM By: [name] MD Signed: 10/03/2020 4:51:55 PM By: Levan Hurst RN, BSN Previous Signature: 06/18/2020 4:29:11 PM Version By: [name] MD Previous Signature: 06/19/2020 6:22:58 PM Version By: Rhae Hammock RN Entered By: Levan Hurst on 06/27/2020 08:15:57 -------------------------------------------------------------------------------- Problem List Details Patient Name: Date of Service: [name] 06/18/2020 7:30 A M Medical Record Number: [id number] Patient Account Number: [id number] Date of Birth/Sex: Treating RN: 01-27-1970 (50 y.o. 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 04/09/2020 No Yes T81.31XD Disruption of external operation (surgical) wound, not elsewhere classified, 04/09/2020 No Yes subsequent encounter 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 and subcutaneous tissue related to 04/09/2020 No  Yes radiation Z90.10 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 04/09/2020 No Yes Inactive Problems Resolved Problems Electronic Signature(s) Signed: 06/18/2020 4:29:11 PM By: [name] MD Entered By: [name] on 06/18/2020 08:21:56 -------------------------------------------------------------------------------- Progress Note Details Patient Name: Date of Service: [name] 06/18/2020 7:30 A M Medical Record Number: [id number] Patient Account Number: [id number] Date of Birth/Sex: Treating RN: [birth year] (50 y.o. [name], [name] Primary Care Provider: [name] Other Clinician: Referring Provider: Treating Provider/Extender: [name] in Treatment: 10 Subjective History of Present Illness (HPI) ADMISSION 04/09/2020 This is a 50 year old woman who is history of breast cancer in the left breast goes back to 2018. He was found to have a mass in her left breast. She underwent a left back lumpectomy and then subsequent chemo that she did not tolerate very well. She had seed implants placed but then underwent external beam a radiation in the spring 2019. 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 Her adjunctive radiation was from 03/11/2017 through 04/21/2017. Unfortunately she had a left breast biopsy on 11/24/2019 and it was again positive for an invasive ductal carcinoma grade 3 he is to resume at receptor 80% positive. HER-2 negative progesterone negative. She opted for bilateral mastectomies which were done on 01/09/2020 on the right with no evidence of malignancy on the left stage IIb invasive ductal carcinoma. She again received chemotherapy but she did not tolerate it because of wound dehiscence and low platelet counts according to the patient. Unfortunately her mastectomy wound dehisced  on the left breast. She was taken back to the OR by [name] of general surgery on 33/24/22 and she underwent a debridement and subsequent attempt at closure. She had been previously treated with Augmentin for suspected underlying infection. Once again the wound has dehisced in the mid aspect. It is roughly 1 cm deep there is tunnels both medially and laterally she. She still has sutures in place. Still describes constant pain drainage. She also has a drain still in place that drains about 10 cc/day. Currently she is changing ABD pad several times a day 4/12; patient went back to see [name] who remove most of the stitches and debrided her wound. She has been using silver alginate culture I did last time which was a PCR culture showed Peptostreptococcus, Enterococcus faecalis and Staphylococcus epidermidis which is of questionable significance. She has a larger wound now that the sutures have been removed. There are undermining areas superior medial superior lateral and a deep tunneling area in the inferior part of the wound. There is no obvious purulence. I started her on Augmentin on 04/12/2020 and I will likely extend this for another week AMAZINGLY; she is already been approved for HBO and will start today. [name] Echocardiogram which I reviewed from 2019 showed a normal ejection fraction.. I went over side effects of HBO with the patient and answered any of her questions. Her husband was present 5/3; its been a while since have seen this  wound. She went to see [name] about 2-1/2 weeks ago according to the patient he can remove some of the surrounding debris. She will see him again later this week or early next week. She has been using silver alginate. A lot of this is problematic since she has Medicaid which traditionally does not cover wound care supplies although it traditionally does not cover HBO either Her wound actually looks quite a bit better than the  last time I saw this better looking surface healthy looking tissue. She has tunnels at 810 2 in 6 the deepest of these is at 2 at 2.2 cm there is no exposed bone. 5/10; the patient was coming in for nurse visit today but requested that I see the wound because of surface discoloration. She states the Iodoflex makes the surface of this turned "white". She is not really complaining of undue tenderness. She is changing this every second day. The goal of this had been to get the surface of the wound to a vibrant red color and then potentially look at changing to a silver collagen-based dressing, Puraply, and then perhaps an alternative skin substituteo Derma vest. The patient tells me she has an appoint with her oncologist [name] on the 19th. She apparently think she is going to get another PET scan. Based on this I would like his comment about whether there is possibility of any malignancy in the wound itself or just underneath it. Up to now we have not really thought that was true 5/17; nice improvement in the chest wound. Less depth. We have been using Iodoflex to clean off the wound surface this seems to have done nicely. Patient went to see [name] 2 days ago who has referred her to see Dr. Claudia Desanctis. I certainly agree with the thought the surface of the wound looks a lot better now and I think consideration of plastic surgery is certainly something the patient should discuss. I have talked to the patient about this today and I certainly support a conversation with Dr. Claudia Desanctis. I also wondered about is there any residual cancer in this. [name] apparently did not seem to think so per the patient although I do not see that in the text conversations we have had. I also wonder about a skin substitute if her version of Medicare [[employer]CCP] would cover this. [name] is apparently considering starting her on [place]. Patient is already very scared about the side effects but she will discuss this  with [name] on 5/19 5/31; patient's wound about the same in terms of size she still has the tunneling area to the left. Surface of the wound looks a lot better there has been some filling in the granulation I have been included on serial text between Dr. Callie Fielding surgery] Dr. Ruffin Frederick oncology] and Dr. Para Skeans surgery]. I think there is not a lot of thought that the patient has recurrence of her cancer her MRI was indeterminant but [name] did not feel that there was any reason to think she has a major recurrence and he was not in favor of doing a dedicated breast MRI. Dr. Claudia Desanctis wants to go ahead with a flap closure I did it. I have not reviewed his note but per the patient this is a latissimus dorsi myocutaneous flap or I think a skin flap from her abdomen. I have talked to the patient I think it is time to attempt this. If I can continue hyperbarics surrounding the flap  then we will attempt to do this 6/14; patient using Prisma as the primary dressing here. She has completed hyperbarics and did very well. Her wound is responded nicely. The depth of this and the condition of the granulation tissue looks a lot better. She has undermining areas at 2 and 10:00 both of these seem to be better as well. She is scheduled for flap closure on 7/11 by Dr. March Rummage Objective Constitutional Sitting or standing Blood Pressure is within target range for patient.. Pulse regular and within target range for patient.Marland Kitchen Respirations regular, non-labored and within target range.. Temperature is normal and within the target range for the patient.Marland Kitchen Appears in no distress. Vitals Time Taken: 7:45 AM, Height: 67 in, Weight: 257 lbs, BMI: 40.2, Temperature: 98.2 F, Pulse: 83 bpm, Respiratory Rate: 20 breaths/min, Blood Pressure: 138/87 mmHg. General Notes: Wound exam; granulation tissue was remarkably better. Everything appears to be healthy there is some mild slough on the surface but I  did not think this required debridement at this point. The patient complains of tightness to the left of the wound towards the axilla itself but I feel no particular abnormalities here certainly no evidence of infection, subcutaneous fluid or anything to suggest an abscess. There is no erythema around the wound and the irradiated tissue that I could tell. Integumentary (Hair, Skin) Wound #1 status is Open. Original cause of wound was Surgical Injury. The date acquired was: 01/16/2020. The wound has been in treatment 10 weeks. The wound is located on the Left Chest. The wound measures 4.7cm length x 4.6cm width x 1.3cm depth; 16.98cm^2 area and 22.074cm^3 volume. There is Fat Layer (Subcutaneous Tissue) exposed. There is no undermining noted, however, there is tunneling at 11:00 with a maximum distance of 1.4cm. There is additional tunneling and at 2:00 with a maximum distance of 2.2cm. There is a medium amount of serosanguineous drainage noted. The wound margin is well defined and not attached to the wound base. There is medium (34-66%) red granulation within the wound bed. There is a medium (34-66%) amount of necrotic tissue within the wound bed including Adherent Slough. Assessment Active Problems ICD-10 Intraductal carcinoma in situ of left breast Disruption of external operation (surgical) wound, not elsewhere classified, subsequent encounter 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 Plan Follow-up Appointments: Return Appointment in 2 weeks. - Dr. Dellia Nims Nurse Visit: - 1 week Other: - Surgery 07/11 with Dr. Claudia Desanctis Bathing/ Shower/ Hygiene: May shower with protection but do not get wound dressing(s) wet. Additional Orders / Instructions: Follow Nutritious Diet Hyperbaric Oxygen Therapy: Evaluate for HBO Therapy - Evaluate for extending HBO therapy. Start date: 07/09/2020. Continue until 07/11  surgery date. Once approved after surgery, pt. to continue HBO. Indication: - soft tissue radionecrosis 2.5 ATA for 90 Minutes with 2 Five (5) Minute Air Breaks T Number of Treatments: - 40 otal One treatments per day (delivered Monday through Friday unless otherwise specified in Special Instructions below): Afrin (Oxymetazoline HCL) 0.05% nasal spray - 1 spray in both nostrils daily as needed prior to HBO treatment for difficulty clearing ears WOUND #1: - Chest Wound Laterality: Left Cleanser: Normal Saline (Generic) 1 x Per Day/15 Days Discharge Instructions: Cleanse the wound with Normal Saline prior to applying a clean dressing using gauze sponges, not tissue or cotton balls. Prim Dressing: Promogran Prisma Matrix, 4.34 (sq in) (silver collagen) 1 x Per Day/15 Days ary Discharge Instructions: Moisten collagen with saline  or hydrogel Secondary Dressing: Zetuvit Plus 4x4 in (Generic) 1 x Per Day/15 Days Discharge Instructions: Apply over primary dressing as directed. Add-Ons: Byram (Generic) 1 x Per Day/15 Days Add-Ons: 1 x Per Day/15 Days 1. We are continuing with silver collagen as the primary dressing. She seems to be doing remarkably well I think mostly related to her hyperbaric oxygen. 2. Certainly agree with the attempt at flap closure by Dr. Claudia Desanctis. 3. I think she would benefit from additional hyperbarics surrounding the attempt at flap closure in this area. I will run this by Dr. Claudia Desanctis see if he has any strong feelings before putting this through her insurance 4. I saw no evidence of infection Electronic Signature(s) Signed: 06/18/2020 4:29:11 PM By: [name] MD Entered By: [name] on 06/18/2020 08:26:04 -------------------------------------------------------------------------------- SuperBill Details Patient Name: Date of Service: [name], [name] 06/18/2020 Medical Record Number: [id number] Patient Account Number: [id number] Date of Birth/Sex: Treating  RN: 09/11/1970 (50 y.o. 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 T81.31XD Disruption of external operation (surgical) wound, not elsewhere classified, subsequent encounter 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 Facility Procedures CPT4 Code: [id number] Description: 99213 - WOUND CARE VISIT-LEV 3 EST PT Modifier: Quantity: 1 Physician Procedures : CPT4 Code Description Modifier [id number] - WC PHYS LEVEL 3 - EST PT ICD-10 Diagnosis Description T81.31XD Disruption of external operation (surgical) wound, not elsewhere classified, subsequent encounter 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

## 2020-06-20 ENCOUNTER — Telehealth: Payer: Self-pay

## 2020-06-20 NOTE — Telephone Encounter (Signed)
Medical/Surgical Clearance received from [name]- regarding:  [name]. Patient had stated that she may be starting the above medication & was asking if this could compromise her wound healing -after the planned surgery with Dr. Claudia Desanctis I forwarded the surgical clearance request to [name] & he confirmed that she is cleared for surgery & that he will hold the Lynparza at this time.

## 2020-06-25 ENCOUNTER — Other Ambulatory Visit: Payer: Self-pay

## 2020-06-25 ENCOUNTER — Encounter (HOSPITAL_BASED_OUTPATIENT_CLINIC_OR_DEPARTMENT_OTHER): Payer: Medicaid Other | Admitting: Internal Medicine

## 2020-06-25 DIAGNOSIS — L59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: Secondary | ICD-10-CM | POA: Diagnosis not present

## 2020-06-25 NOTE — Progress Notes (Signed)
EDDIE, KOC ([id number]) Visit Report for 06/25/2020 Arrival Information Details Patient Name: Date of Service: Sarah [name] [name] 06/25/2020 7:30 Sarah M Medical Record Number: [id number] Patient Account Number: [id number] Date of Birth/Sex: Treating RN: 15-Mar-1970 (50 y.o. [name], [name] Primary Care Jaycie Kregel: [name] Other Clinician: Referring Kazmir Oki: Treating Sevin Farone/Extender: [name] in Treatment: 11 Visit Information History Since Last Visit Added or deleted any medications: No Patient Arrived: Ambulatory Any new allergies or adverse reactions: No Arrival Time: 07:51 Had Sarah fall or experienced change in No Accompanied By: self activities of daily living that may affect Transfer Assistance: None risk of falls: Patient Identification Verified: Yes Signs or symptoms of abuse/neglect since last visito No Secondary Verification Process Completed: Yes Hospitalized since last visit: No Patient Requires Transmission-Based Precautions: No Implantable device outside of the clinic excluding No Patient Has Alerts: No cellular tissue based products placed in the center since last visit: Has Dressing in Place as Prescribed: Yes Pain Present Now: No Electronic Signature(s) Signed: 06/25/2020 9:20:10 AM By: Sandre Kitty Entered By: Sandre Kitty on 06/25/2020 07:51:56 -------------------------------------------------------------------------------- Clinic Level of Care Assessment Details Patient Name: Date of Service: Sarah [name] [name] 06/25/2020 7:30 Sarah M Medical Record Number: [id number] Patient Account Number: [id number] Date of Birth/Sex: Treating RN: [birth year] (50 y.o. [name] Primary Care Nysia Dell: [name] Other Clinician: Referring Timisha Mondry: Treating Sharita Bienaime/Extender: [name] in Treatment: 11 Clinic Level of Care Assessment Items TOOL 4 Quantity Score X- 1  0 Use when only an EandM is performed on FOLLOW-UP visit ASSESSMENTS - Nursing Assessment / Reassessment X- 1 10 Reassessment of Co-morbidities (includes updates in patient status) X- 1 5 Reassessment of Adherence to Treatment Plan ASSESSMENTS - Wound and Skin Sarah ssessment / Reassessment []  - 0 Simple Wound Assessment / Reassessment - one wound X- 1 5 Complex Wound Assessment / Reassessment - multiple wounds []  - 0 Dermatologic / Skin Assessment (not related to wound area) ASSESSMENTS - Focused Assessment []  - 0 Circumferential Edema Measurements - multi extremities []  - 0 Nutritional Assessment / Counseling / Intervention []  - 0 Lower Extremity Assessment (monofilament, tuning fork, pulses) []  - 0 Peripheral Arterial Disease Assessment (using hand held doppler) ASSESSMENTS - Ostomy and/or Continence Assessment and Care []  - 0 Incontinence Assessment and Management []  - 0 Ostomy Care Assessment and Management (repouching, etc.) PROCESS - Coordination of Care []  - 0 Simple Patient / Family Education for ongoing care X- 1 20 Complex (extensive) Patient / Family Education for ongoing care []  - 0 Staff obtains Programmer, systems, Records, T Results / Process Orders est []  - 0 Staff telephones HHA, Nursing Homes / Clarify orders / etc []  - 0 Routine Transfer to another Facility (non-emergent condition) []  - 0 Routine Hospital Admission (non-emergent condition) []  - 0 New Admissions / Biomedical engineer / Ordering NPWT Apligraf, etc. , []  - 0 Emergency Hospital Admission (emergent condition) []  - 0 Simple Discharge Coordination []  - 0 Complex (extensive) Discharge Coordination PROCESS - Special Needs []  - 0 Pediatric / Minor Patient Management []  - 0 Isolation Patient Management []  - 0 Hearing / Language / Visual special needs []  - 0 Assessment of Community assistance (transportation, D/C planning, etc.) []  - 0 Additional assistance / Altered mentation []  -  0 Support Surface(s) Assessment (bed, cushion, seat, etc.) INTERVENTIONS - Wound Cleansing / Measurement []  - 0 Simple Wound Cleansing - one wound X- 1 5 Complex  Wound Cleansing - multiple wounds []  - 0 Wound Imaging (photographs - any number of wounds) []  - 0 Wound Tracing (instead of photographs) []  - 0 Simple Wound Measurement - one wound X- 1 5 Complex Wound Measurement - multiple wounds INTERVENTIONS - Wound Dressings []  - 0 Small Wound Dressing one or multiple wounds X- 1 15 Medium Wound Dressing one or multiple wounds []  - 0 Large Wound Dressing one or multiple wounds []  - 0 Application of Medications - topical []  - 0 Application of Medications - injection INTERVENTIONS - Miscellaneous []  - 0 External ear exam []  - 0 Specimen Collection (cultures, biopsies, blood, body fluids, etc.) []  - 0 Specimen(s) / Culture(s) sent or taken to Lab for analysis []  - 0 Patient Transfer (multiple staff / Civil Service fast streamer / Similar devices) []  - 0 Simple Staple / Suture removal (25 or less) []  - 0 Complex Staple / Suture removal (26 or more) []  - 0 Hypo / Hyperglycemic Management (close monitor of Blood Glucose) []  - 0 Ankle / Brachial Index (ABI) - do not check if billed separately X- 1 5 Vital Signs Has the patient been seen at the hospital within the last three years: Yes Total Score: 70 Level Of Care: New/Established - Level 2 Electronic Signature(s) Signed: 06/25/2020 4:28:34 PM By: Lorrin Jackson Entered By: Lorrin Jackson on 06/25/2020 08:12:39 -------------------------------------------------------------------------------- Encounter Discharge Information Details Patient Name: Date of Service: Sarah Crochet V [name] 06/25/2020 7:30 Sarah M Medical Record Number: [id number] Patient Account Number: [id number] Date of Birth/Sex: Treating RN: 21-Oct-1970 (50 y.o. [name] Primary Care Audree Schrecengost: [name] Other Clinician: Referring Karthik Whittinghill: Treating  Aundray Cartlidge/Extender: [name] in Treatment: 11 Encounter Discharge Information Items Discharge Condition: Stable Ambulatory Status: Ambulatory Discharge Destination: Home Transportation: Private Auto Schedule Follow-up Appointment: Yes Clinical Summary of Care: Provided on 06/25/2020 Form Type Recipient Paper Patient Patient Electronic Signature(s) Signed: 06/25/2020 4:28:34 PM By: Lorrin Jackson Entered By: Lorrin Jackson on 06/25/2020 08:12:04 -------------------------------------------------------------------------------- Patient/Caregiver Education Details Patient Name: Date of Service: Sarah [name] 6/21/2022andnbsp7:30 Sarah M Medical Record Number: [id number] Patient Account Number: [id number] Date of Birth/Gender: Treating RN: [birth year] (50 y.o. [name] Primary Care Physician: [name] Other Clinician: Referring Physician: Treating Physician/Extender: [name] in Treatment: 11 Education Assessment Education Provided To: Patient Education Topics Provided Wound/Skin Impairment: Methods: Explain/Verbal Responses: State content correctly [employer]) Signed: 06/25/2020 4:28:34 PM By: Lorrin Jackson Entered By: Lorrin Jackson on 06/25/2020 08:11:43 -------------------------------------------------------------------------------- Wound Assessment Details Patient Name: Date of Service: Sarah [name] 06/25/2020 7:30 Sarah M Medical Record Number: [id number] Patient Account Number: [id number] Date of Birth/Sex: Treating RN: 1970/10/06 (50 y.o. [name], [name] Primary Care Irvin Bastin: [name] Other Clinician: Referring Kiril Hippe: Treating Rylynne Schicker/Extender: [name] in Treatment: 11 Wound Status Wound Number: 1 Primary Etiology: Open Surgical Wound Wound Location: Left Chest Secondary Etiology: Necrosis (Radiation) Wounding Event:  Surgical Injury Wound Status: Open Date Acquired: 01/16/2020 Weeks Of Treatment: 11 Clustered Wound: No Wound Measurements Length: (cm) 4.7 Width: (cm) 4.6 Depth: (cm) 1.3 Area: (cm) 16.98 Volume: (cm) 22.074 % Reduction in Area: -166.9% % Reduction in Volume: -215.4% Wound Description Classification: Full Thickness Without Exposed Support Structur es Treatment Notes Wound #1 (Chest) Wound Laterality: Left Cleanser Normal Saline Discharge Instruction: Cleanse the wound with Normal Saline prior to applying Sarah clean dressing using gauze sponges, not tissue or cotton balls. Peri-Wound Care  Topical Primary Dressing Promogran Prisma Matrix, 4.34 (sq in) (silver collagen) Discharge Instruction: Moisten collagen with saline or hydrogel Secondary Dressing Zetuvit Plus 4x4 in Discharge Instruction: Apply over primary dressing as directed. Secured With Compression Wrap Compression Stockings Add-Ons Education officer, community) Signed: 06/25/2020 9:20:10 AM By: Sandre Kitty Signed: 06/25/2020 5:26:08 PM By: Rhae Hammock RN Entered By: Sandre Kitty on 06/25/2020 07:52:23 -------------------------------------------------------------------------------- Vitals Details Patient Name: Date of Service: Sarah Crochet V [name] 06/25/2020 7:30 Sarah M Medical Record Number: [id number] Patient Account Number: [id number] Date of Birth/Sex: Treating RN: 06/29/1970 (50 y.o. [name], [name] Primary Care Jniyah Dantuono: [name] Other Clinician: Referring Lizzie Cokley: Treating Aidynn Krenn/Extender: [name] in Treatment: 11 Vital Signs Time Taken: 07:51 Temperature (F): 98.5 Height (in): 67 Pulse (bpm): 85 Weight (lbs): 257 Respiratory Rate (breaths/min): 20 Body Mass Index (BMI): 40.2 Blood Pressure (mmHg): 137/72 Reference Range: 80 - 120 mg / dl Electronic Signature(s) Signed: 06/25/2020 9:20:10 AM By: Sandre Kitty Entered By:  Sandre Kitty on 06/25/2020 07:52:14

## 2020-06-25 NOTE — Progress Notes (Signed)
VERDINE, GRENFELL ([id number]) Visit Report for 06/25/2020 SuperBill Details Patient Name: Date of Service: [name] [name] 06/25/2020 Medical Record Number: [id number] Patient Account Number: [id number] Date of Birth/Sex: Treating RN: [birth year] (50 y.o. 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

## 2020-06-26 ENCOUNTER — Ambulatory Visit (INDEPENDENT_AMBULATORY_CARE_PROVIDER_SITE_OTHER): Payer: Medicaid Other

## 2020-06-26 ENCOUNTER — Other Ambulatory Visit: Payer: Self-pay

## 2020-06-26 VITALS — BP 135/84 | HR 93 | Ht 67.0 in | Wt 261.0 lb

## 2020-06-26 DIAGNOSIS — S21102A Unspecified open wound of left front wall of thorax without penetration into thoracic cavity, initial encounter: Secondary | ICD-10-CM | POA: Diagnosis not present

## 2020-07-02 ENCOUNTER — Other Ambulatory Visit (HOSPITAL_COMMUNITY): Payer: Self-pay

## 2020-07-02 ENCOUNTER — Encounter (HOSPITAL_BASED_OUTPATIENT_CLINIC_OR_DEPARTMENT_OTHER): Payer: Medicaid Other | Admitting: Internal Medicine

## 2020-07-03 ENCOUNTER — Encounter: Payer: Self-pay | Admitting: Gastroenterology

## 2020-07-03 ENCOUNTER — Other Ambulatory Visit: Payer: Self-pay

## 2020-07-03 ENCOUNTER — Telehealth: Payer: Self-pay | Admitting: Oncology

## 2020-07-03 ENCOUNTER — Encounter (HOSPITAL_BASED_OUTPATIENT_CLINIC_OR_DEPARTMENT_OTHER): Payer: Medicaid Other | Admitting: Internal Medicine

## 2020-07-03 DIAGNOSIS — L59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: Secondary | ICD-10-CM | POA: Diagnosis not present

## 2020-07-03 NOTE — Progress Notes (Signed)
[name], [name] ([id number]) Visit Report for 07/03/2020 HPI Details Patient Name: Date of Service: [name] 07/03/2020 9:15 A M Medical Record Number: [id number] Patient Account Number: [id number] Date of Birth/Sex: Treating RN: 08-Mar-1970 (50 y.o. [name] Primary Care Provider: [name] Other Clinician: Referring Provider: Treating Provider/Extender: [name] in Treatment: 12 History of Present Illness HPI Description: ADMISSION 04/09/2020 This is a 50 year old woman who is history of breast cancer in the left breast goes back to 2018. He was found to have a mass in her left breast. She underwent a left back lumpectomy and then subsequent chemo that she did not tolerate very well. She had seed implants placed but then underwent external beam a radiation in the spring 2019. 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 Her adjunctive radiation was from 03/11/2017 through 04/21/2017. Unfortunately she had a left breast biopsy on 11/24/2019 and it was again positive for an invasive ductal carcinoma grade 3 he is to resume at receptor 80% positive. HER-2 negative progesterone negative. She opted for bilateral mastectomies which were done on 01/09/2020 on the right with no evidence of malignancy on the left stage IIb invasive ductal carcinoma. She again received chemotherapy but she did not tolerate it because of wound dehiscence and low platelet counts according to the patient. Unfortunately her mastectomy wound dehisced on the left breast. She was taken back to the OR by [name] of general surgery on 33/24/22 and she underwent a debridement and subsequent attempt at closure. She had been previously treated with Augmentin for suspected underlying infection. Once again the wound has dehisced in the mid  aspect. It is roughly 1 cm deep there is tunnels both medially and laterally she. She still has sutures in place. Still describes constant pain drainage. She also has a drain still in place that drains about 10 cc/day. Currently she is changing ABD pad several times a day 4/12; patient went back to see [name] who remove most of the stitches and debrided her wound. She has been using silver alginate culture I did last time which was a PCR culture showed Peptostreptococcus, Enterococcus faecalis and Staphylococcus epidermidis which is of questionable significance. She has a larger wound now that the sutures have been removed. There are undermining areas superior medial superior lateral and a deep tunneling area in the inferior part of the wound. There is no obvious purulence. I started her on Augmentin on 04/12/2020 and I will likely extend this for another week AMAZINGLY; she is already been approved for HBO and will start today. [name] Echocardiogram which I reviewed from 2019 showed a normal ejection fraction.. I went over side effects of HBO with the patient and answered any of her questions. Her husband was present 5/3; its been a while since have seen this wound. She went to see [name] about 2-1/2 weeks ago according to the patient he can remove some of the surrounding debris. She will see him again later this week or early next week. She has been using silver alginate. A lot of this is problematic since she has Medicaid which traditionally does not cover wound care supplies although it traditionally does not cover HBO either Her wound actually looks quite a bit better than the last time I saw this better looking surface healthy looking  tissue. She has tunnels at 810 2 in 6 the deepest of these is at 2 at 2.2 cm there is no exposed bone. 5/10; the patient was coming in for nurse visit today but requested that I see the wound because of surface discoloration. She  states the Iodoflex makes the surface of this turned "white". She is not really complaining of undue tenderness. She is changing this every second day. The goal of this had been to get the surface of the wound to a vibrant red color and then potentially look at changing to a silver collagen-based dressing, Puraply, and then perhaps an alternative skin substituteo Derma vest. The patient tells me she has an appoint with her oncologist [name] on the 19th. She apparently think she is going to get another PET scan. Based on this I would like his comment about whether there is possibility of any malignancy in the wound itself or just underneath it. Up to now we have not really thought that was true 5/17; nice improvement in the chest wound. Less depth. We have been using Iodoflex to clean off the wound surface this seems to have done nicely. Patient went to see [name] 2 days ago who has referred her to see Dr. Claudia Desanctis. I certainly agree with the thought the surface of the wound looks a lot better now and I think consideration of plastic surgery is certainly something the patient should discuss. I have talked to the patient about this today and I certainly support a conversation with Dr. Claudia Desanctis. I also wondered about is there any residual cancer in this. [name] apparently did not seem to think so per the patient although I do not see that in the text conversations we have had. I also wonder about a skin substitute if her version of Medicare [[employer]] would cover this. [name] is apparently considering starting her on [place]. Patient is already very scared about the side effects but she will discuss this with [name] on 5/19 5/31; patient's wound about the same in terms of size she still has the tunneling area to the left. Surface of the wound looks a lot better there has been some filling in the granulation I have been included on serial text between Dr. Callie Fielding surgery]  Dr. Ruffin Frederick oncology] and Dr. Para Skeans surgery]. I think there is not a lot of thought that the patient has recurrence of her cancer her MRI was indeterminant but [name] did not feel that there was any reason to think she has a major recurrence and he was not in favor of doing a dedicated breast MRI. Dr. Claudia Desanctis wants to go ahead with a flap closure I did it. I have not reviewed his note but per the patient this is a latissimus dorsi myocutaneous flap or I think a skin flap from her abdomen. I have talked to the patient I think it is time to attempt this. If I can continue hyperbarics surrounding the flap then we will attempt to do this 6/14; patient using Prisma as the primary dressing here. She has completed hyperbarics and did very well. Her wound is responded nicely. The depth of this and the condition of the granulation tissue looks a lot better. She has undermining areas at 2 and 10:00 both of these seem to be better as well. She is scheduled for flap closure on 7/11 by Dr. Claudia Desanctis 6/29; the patient continues to use Prisma wet-to-dry. Her graft is scheduled for 7/11 by  Dr. Claudia Desanctis. We are looking at getting her additional hyperbaric treatment which I think might help with the likelihood of graft survival in this difficult irradiated area Electronic Signature(s) Signed: 07/03/2020 4:58:19 PM By: [name] MD Signed: 07/03/2020 4:58:19 PM By: [name] MD Entered By: [name] on 07/03/2020 10:22:56 -------------------------------------------------------------------------------- Physical Exam Details Patient Name: Date of Service: [name] 07/03/2020 9:15 A M Medical Record Number: [id number] Patient Account Number: [id number] Date of Birth/Sex: Treating RN: 1970/10/07 (50 y.o. [name] Primary Care Provider: [name] Other Clinician: Referring Provider: Treating Provider/Extender: [name] in  Treatment: 12 Constitutional Sitting or standing Blood Pressure is within target range for patient.. Pulse regular and within target range for patient.Marland Kitchen Respirations regular, non-labored and within target range.. Temperature is normal and within the target range for the patient.Marland Kitchen Appears in no distress. Notes Wound exam; granulation tissue looks healthy. Undermining areas have filled in. She is scheduled for plastic surgery on 7/11. No evidence of surrounding infection. Periwound irradiated tissue Electronic Signature(s) Signed: 07/03/2020 4:58:19 PM By: [name] MD Entered By: [name] on 07/03/2020 10:23:53 -------------------------------------------------------------------------------- Physician Orders Details Patient Name: Date of Service: [name] 07/03/2020 9:15 A M Medical Record Number: [id number] Patient Account Number: [id number] Date of Birth/Sex: Treating RN: 06/12/1970 (50 y.o. 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 - with Dr. Dellia Nims Bathing/ Shower/ Hygiene May shower with protection but do not get wound dressing(s) wet. Additional Orders / Instructions Follow Nutritious Diet Hyperbaric Oxygen Therapy Evaluate for HBO Therapy - Evaluate for extending HBO therapy. Start after surgery on 07/15/20. Indication: - soft tissue radionecrosis 2.5 ATA for 90 Minutes with 2 Five (5)  Minute A Breaks ir Total Number of Treatments: - 30 One treatments per day (delivered Monday through Friday unless otherwise specified in Special Instructions below): Afrin (Oxymetazoline HCL) 0.05% nasal spray - 1 spray in both nostrils daily as needed prior to HBO treatment for difficulty clearing ears Wound Treatment Wound #1 - Chest Wound Laterality: Left Cleanser: Normal Saline (Generic) 1 x Per Day/15 Days Discharge Instructions: Cleanse the wound with Normal Saline prior to applying a clean dressing using gauze sponges, not tissue or cotton balls. Peri-Wound Care: Skin Prep 1 x Per Day/15 Days Discharge Instructions: Use skin prep as directed Prim Dressing: Promogran Prisma Matrix, 4.34 (sq in) (silver collagen) 1 x Per Day/15 Days ary Discharge Instructions: Moisten collagen with saline or hydrogel Secondary Dressing: Zetuvit Plus Silicone Border Dressing 4x4 (in/in) 1 x Per Day/15 Days Discharge Instructions: Apply silicone border over primary dressing as directed. Electronic Signature(s) Signed: 07/03/2020 4:56:05 PM By: Levan Hurst RN, BSN Signed: 07/03/2020 4:58:19 PM By: [name] MD Entered By: Levan Hurst on 07/03/2020 10:04:59 -------------------------------------------------------------------------------- Problem List Details Patient Name: Date of Service: [name] 07/03/2020 9:15 A M Medical Record Number: [id number] Patient Account Number: [id number] Date of Birth/Sex: Treating RN: Mar 06, 1970 (50 y.o. 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 04/09/2020 No Yes T81.31XD Disruption of external operation (surgical) wound, not elsewhere classified, 04/09/2020 No Yes subsequent  encounter 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 and subcutaneous tissue related to 04/09/2020 No Yes radiation Z90.10 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 04/09/2020 No Yes Inactive Problems Resolved Problems Electronic Signature(s) Signed: 07/03/2020 4:58:19 PM By: [name] MD Entered By: [name] on 07/03/2020 10:21:16 -------------------------------------------------------------------------------- Progress Note Details Patient Name: Date of Service: [name] 07/03/2020 9:15 A M Medical Record Number: [id number] Patient Account Number: [id number] Date of Birth/Sex: Treating RN: May 20, 1970 (49 y.o. [name] Primary Care Provider: [name] Other Clinician: Referring Provider: Treating Provider/Extender: [name] in Treatment: 12 Subjective History of Present Illness (HPI) ADMISSION 04/09/2020 This is a 50 year old woman who is history of breast cancer in the left breast goes back to 2018. He was found to have a mass in her left breast. She underwent a left back lumpectomy and then subsequent chemo that she did not tolerate very well. She had seed implants placed but then underwent external beam a radiation in the spring 2019. 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 Her adjunctive radiation was from 03/11/2017 through 04/21/2017. Unfortunately she had a left breast biopsy on 11/24/2019 and it was again positive for an invasive ductal carcinoma grade 3 he is to resume at receptor 80% positive. HER-2 negative progesterone negative. She opted for bilateral mastectomies which were done on 01/09/2020 on the right with no evidence of malignancy on the left stage IIb invasive ductal carcinoma. She again received chemotherapy but she did not tolerate it because of wound  dehiscence and low platelet counts according to the patient. Unfortunately her mastectomy wound dehisced on the left breast. She was taken back to the OR by [name] of general surgery on 33/24/22 and she underwent a debridement and subsequent attempt at closure. She had been previously treated with Augmentin for suspected underlying infection. Once again the wound has dehisced in the mid aspect. It is roughly 1 cm deep there is tunnels both medially and laterally she. She still has sutures in place. Still describes constant pain drainage. She also has a drain still in place that drains about 10 cc/day. Currently she is changing ABD pad several times a day 4/12; patient went back to see [name] who remove most of the stitches and debrided her wound. She has been using silver alginate culture I did last time which was a PCR culture showed Peptostreptococcus, Enterococcus faecalis and Staphylococcus epidermidis which is of questionable significance. She has a larger wound now that the sutures have been removed. There are undermining areas superior medial superior lateral and a deep tunneling area in the inferior part of the wound. There is no obvious purulence. I started her on Augmentin on 04/12/2020 and I will likely extend this for another week AMAZINGLY; she is already been approved for HBO and will start today. [name] Echocardiogram which I reviewed from 2019 showed a normal ejection fraction.. I went over side effects of HBO with the patient and answered any of her questions. Her husband was present 5/3; its been a while  since have seen this wound. She went to see [name] about 2-1/2 weeks ago according to the patient he can remove some of the surrounding debris. She will see him again later this week or early next week. She has been using silver alginate. A lot of this is problematic since she has Medicaid which traditionally does not cover wound care supplies  although it traditionally does not cover HBO either Her wound actually looks quite a bit better than the last time I saw this better looking surface healthy looking tissue. She has tunnels at 810 2 in 6 the deepest of these is at 2 at 2.2 cm there is no exposed bone. 5/10; the patient was coming in for nurse visit today but requested that I see the wound because of surface discoloration. She states the Iodoflex makes the surface of this turned "white". She is not really complaining of undue tenderness. She is changing this every second day. The goal of this had been to get the surface of the wound to a vibrant red color and then potentially look at changing to a silver collagen-based dressing, Puraply, and then perhaps an alternative skin substituteo Derma vest. The patient tells me she has an appoint with her oncologist [name] on the 19th. She apparently think she is going to get another PET scan. Based on this I would like his comment about whether there is possibility of any malignancy in the wound itself or just underneath it. Up to now we have not really thought that was true 5/17; nice improvement in the chest wound. Less depth. We have been using Iodoflex to clean off the wound surface this seems to have done nicely. Patient went to see [name] 2 days ago who has referred her to see Dr. Claudia Desanctis. I certainly agree with the thought the surface of the wound looks a lot better now and I think consideration of plastic surgery is certainly something the patient should discuss. I have talked to the patient about this today and I certainly support a conversation with Dr. Claudia Desanctis. I also wondered about is there any residual cancer in this. [name] apparently did not seem to think so per the patient although I do not see that in the text conversations we have had. I also wonder about a skin substitute if her version of Medicare [BCCP] would cover this. [name] is apparently considering  starting her on [place]. Patient is already very scared about the side effects but she will discuss this with [name] on 5/19 5/31; patient's wound about the same in terms of size she still has the tunneling area to the left. Surface of the wound looks a lot better there has been some filling in the granulation I have been included on serial text between Dr. Callie Fielding surgery] Dr. Ruffin Frederick oncology] and Dr. Para Skeans surgery]. I think there is not a lot of thought that the patient has recurrence of her cancer her MRI was indeterminant but [name] did not feel that there was any reason to think she has a major recurrence and he was not in favor of doing a dedicated breast MRI. Dr. Claudia Desanctis wants to go ahead with a flap closure I did it. I have not reviewed his note but per the patient this is a latissimus dorsi myocutaneous flap or I think a skin flap from her abdomen. I have talked to the patient I think it is time to attempt this. If I can continue  hyperbarics surrounding the flap then we will attempt to do this 6/14; patient using Prisma as the primary dressing here. She has completed hyperbarics and did very well. Her wound is responded nicely. The depth of this and the condition of the granulation tissue looks a lot better. She has undermining areas at 2 and 10:00 both of these seem to be better as well. She is scheduled for flap closure on 7/11 by Dr. Claudia Desanctis 6/29; the patient continues to use Prisma wet-to-dry. Her graft is scheduled for 7/11 by Dr. Claudia Desanctis. We are looking at getting her additional hyperbaric treatment which I think might help with the likelihood of graft survival in this difficult irradiated area Objective Constitutional Sitting or standing Blood Pressure is within target range for patient.. Pulse regular and within target range for patient.Marland Kitchen Respirations regular, non-labored and within target range.. Temperature is normal and within the target range  for the patient.Marland Kitchen Appears in no distress. Vitals Time Taken: 9:37 AM, Height: 67 in, Weight: 257 lbs, BMI: 40.2, Temperature: 98.8 F, Pulse: 68 bpm, Respiratory Rate: 17 breaths/min, Blood Pressure: 122/81 mmHg. General Notes: Wound exam; granulation tissue looks healthy. Undermining areas have filled in. She is scheduled for plastic surgery on 7/11. No evidence of surrounding infection. Periwound irradiated tissue Integumentary (Hair, Skin) Wound #1 status is Open. Original cause of wound was Surgical Injury. The date acquired was: 01/16/2020. The wound has been in treatment 12 weeks. The wound is located on the Left Chest. The wound measures 4.1cm length x 4.4cm width x 1cm depth; 14.169cm^2 area and 14.169cm^3 volume. Assessment Active Problems ICD-10 Intraductal carcinoma in situ of left breast Disruption of external operation (surgical) wound, not elsewhere classified, subsequent encounter 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 Plan Follow-up Appointments: Return appointment in 3 weeks. - with Dr. Arcola Jansky Shower/ Hygiene: May shower with protection but do not get wound dressing(s) wet. Additional Orders / Instructions: Follow Nutritious Diet Hyperbaric Oxygen Therapy: Evaluate for HBO Therapy - Evaluate for extending HBO therapy. Start after surgery on 07/15/20. Indication: - soft tissue radionecrosis 2.5 ATA for 90 Minutes with 2 Five (5) Minute Air Breaks T Number of Treatments: - 30 otal One treatments per day (delivered Monday through Friday unless otherwise specified in Special Instructions below): Afrin (Oxymetazoline HCL) 0.05% nasal spray - 1 spray in both nostrils daily as needed prior to HBO treatment for difficulty clearing ears WOUND #1: - Chest Wound Laterality: Left Cleanser: Normal Saline (Generic) 1 x Per Day/15 Days Discharge Instructions: Cleanse the wound with  Normal Saline prior to applying a clean dressing using gauze sponges, not tissue or cotton balls. Peri-Wound Care: Skin Prep 1 x Per Day/15 Days Discharge Instructions: Use skin prep as directed Prim Dressing: Promogran Prisma Matrix, 4.34 (sq in) (silver collagen) 1 x Per Day/15 Days ary Discharge Instructions: Moisten collagen with saline or hydrogel Secondary Dressing: Zetuvit Plus Silicone Border Dressing 4x4 (in/in) 1 x Per Day/15 Days Discharge Instructions: Apply silicone border over primary dressing as directed. 1. At this point I saw no need to change what she is doing to the wound. Her surgery is on 7/11 2. I am going to try to get her through for additional hyperbaric treatments. We are currently putting this through her insurance. 3. Follow-up 2 weeks Electronic Signature(s) Signed: 07/03/2020 4:58:19 PM By: [name] MD Entered By: [name] on 07/03/2020 10:25:30 -------------------------------------------------------------------------------- SuperBill Details Patient Name: Date of Service:  [name], [name] 07/03/2020 Medical Record Number: [id number] Patient Account Number: [id number] Date of Birth/Sex: Treating RN: 02/03/1970 (50 y.o. 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 T81.31XD Disruption of external operation (surgical) wound, not elsewhere classified, subsequent encounter 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 Facility Procedures CPT4 Code: [id number] Description: 99213 - WOUND CARE VISIT-LEV 3 EST PT Modifier: Quantity: 1 Physician Procedures : CPT4 Code Description Modifier [id number] - WC PHYS  LEVEL 3 - EST PT ICD-10 Diagnosis Description 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

## 2020-07-03 NOTE — Progress Notes (Signed)
KAMBREE, KRAUSS ([id number]) Visit Report for 07/03/2020 Arrival Information Details Patient Name: Date of Service: [name] 07/03/2020 9:15 A M Medical Record Number: [id number] Patient Account Number: [id number] Date of Birth/Sex: Treating RN: 1970/12/30 (50 y.o. [name], [name] Primary Care Ela Moffat: [name] Other Clinician: Referring Jadelyn Elks: Treating Rondell Frick/Extender: [name] in Treatment: 12 Visit Information History Since Last Visit Added or deleted any medications: No Patient Arrived: Ambulatory Any new allergies or adverse reactions: No Arrival Time: 09:34 Had a fall or experienced change in No Accompanied By: self activities of daily living that may affect Transfer Assistance: None risk of falls: Patient Identification Verified: Yes Signs or symptoms of abuse/neglect since last visito No Secondary Verification Process Completed: Yes Hospitalized since last visit: No Patient Requires Transmission-Based Precautions: No Implantable device outside of the clinic excluding No Patient Has Alerts: No cellular tissue based products placed in the center since last visit: Has Dressing in Place as Prescribed: Yes Pain Present Now: Yes Electronic Signature(s) Signed: 07/03/2020 6:02:58 PM By: Rhae Hammock RN Entered By: Rhae Hammock on 07/03/2020 09:35:39 -------------------------------------------------------------------------------- Clinic Level of Care Assessment Details Patient Name: Date of Service: [name] 07/03/2020 9:15 A M Medical Record Number: [id number] Patient Account Number: [id number] Date of Birth/Sex: Treating RN: Jun 15, 1970 (50 y.o. [name] Primary Care Berton Butrick: [name] Other Clinician: Referring Clover Feehan: Treating Chikita Dogan/Extender: [name] in Treatment: 12 Clinic Level of Care Assessment Items TOOL 4 Quantity Score X-  1 0 Use when only an EandM is performed on FOLLOW-UP visit ASSESSMENTS - Nursing Assessment / Reassessment X- 1 10 Reassessment of Co-morbidities (includes updates in patient status) X- 1 5 Reassessment of Adherence to Treatment Plan ASSESSMENTS - Wound and Skin A ssessment / Reassessment X - Simple Wound Assessment / Reassessment - one wound 1 5 []  - 0 Complex Wound Assessment / Reassessment - multiple wounds []  - 0 Dermatologic / Skin Assessment (not related to wound area) ASSESSMENTS - Focused Assessment []  - 0 Circumferential Edema Measurements - multi extremities []  - 0 Nutritional Assessment / Counseling / Intervention []  - 0 Lower Extremity Assessment (monofilament, tuning fork, pulses) []  - 0 Peripheral Arterial Disease Assessment (using hand held doppler) ASSESSMENTS - Ostomy and/or Continence Assessment and Care []  - 0 Incontinence Assessment and Management []  - 0 Ostomy Care Assessment and Management (repouching, etc.) PROCESS - Coordination of Care X - Simple Patient / Family Education for ongoing care 1 15 []  - 0 Complex (extensive) Patient / Family Education for ongoing care X- 1 10 Staff obtains Programmer, systems, Records, T Results / Process Orders est []  - 0 Staff telephones HHA, Nursing Homes / Clarify orders / etc []  - 0 Routine Transfer to another Facility (non-emergent condition) []  - 0 Routine Hospital Admission (non-emergent condition) []  - 0 New Admissions / Biomedical engineer / Ordering NPWT Apligraf, etc. , []  - 0 Emergency Hospital Admission (emergent condition) X- 1 10 Simple Discharge Coordination []  - 0 Complex (extensive) Discharge Coordination PROCESS - Special Needs []  - 0 Pediatric / Minor Patient Management []  - 0 Isolation Patient Management []  - 0 Hearing / Language / Visual special needs []  - 0 Assessment of Community assistance (transportation, D/C planning, etc.) []  - 0 Additional assistance / Altered mentation []  -  0 Support Surface(s) Assessment (bed, cushion, seat, etc.) INTERVENTIONS - Wound Cleansing / Measurement X - Simple Wound Cleansing - one wound 1 5 []  -  0 Complex Wound Cleansing - multiple wounds X- 1 5 Wound Imaging (photographs - any number of wounds) []  - 0 Wound Tracing (instead of photographs) X- 1 5 Simple Wound Measurement - one wound []  - 0 Complex Wound Measurement - multiple wounds INTERVENTIONS - Wound Dressings X - Small Wound Dressing one or multiple wounds 1 10 []  - 0 Medium Wound Dressing one or multiple wounds []  - 0 Large Wound Dressing one or multiple wounds X- 1 5 Application of Medications - topical []  - 0 Application of Medications - injection INTERVENTIONS - Miscellaneous []  - 0 External ear exam []  - 0 Specimen Collection (cultures, biopsies, blood, body fluids, etc.) []  - 0 Specimen(s) / Culture(s) sent or taken to Lab for analysis []  - 0 Patient Transfer (multiple staff / Civil Service fast streamer / Similar devices) []  - 0 Simple Staple / Suture removal (25 or less) []  - 0 Complex Staple / Suture removal (26 or more) []  - 0 Hypo / Hyperglycemic Management (close monitor of Blood Glucose) []  - 0 Ankle / Brachial Index (ABI) - do not check if billed separately X- 1 5 Vital Signs Has the patient been seen at the hospital within the last three years: Yes Total Score: 90 Level Of Care: New/Established - Level 3 Electronic Signature(s) Signed: 07/03/2020 4:56:05 PM By: Levan Hurst RN, BSN Entered By: Levan Hurst on 07/03/2020 10:14:05 -------------------------------------------------------------------------------- Encounter Discharge Information Details Patient Name: Date of Service: [name] 07/03/2020 9:15 A M Medical Record Number: [id number] Patient Account Number: [id number] Date of Birth/Sex: Treating RN: Oct 05, 1970 (50 y.o. [name] Primary Care Pedro Whiters: [name] Other Clinician: Referring Reilly Molchan: Treating  Danaija Eskridge/Extender: [name] in Treatment: 12 Encounter Discharge Information Items Discharge Condition: Stable Ambulatory Status: Ambulatory Discharge Destination: Home Transportation: Private Auto Accompanied By: self Schedule Follow-up Appointment: Yes Clinical Summary of Care: Electronic Signature(s) Signed: 07/03/2020 6:10:10 PM By: Deon Pilling Entered By: Deon Pilling on 07/03/2020 10:40:46 -------------------------------------------------------------------------------- Lower Extremity Assessment Details Patient Name: Date of Service: [name] 07/03/2020 9:15 A M Medical Record Number: [id number] Patient Account Number: [id number] Date of Birth/Sex: Treating RN: Apr 30, 1970 (50 y.o. [name], [name] Primary Care Octavius Shin: [name] Other Clinician: Referring Lajuan Godbee: Treating Tristian Bouska/Extender: [name] in Treatment: 12 Electronic Signature(s) Signed: 07/03/2020 6:02:58 PM By: Rhae Hammock RN Entered By: Rhae Hammock on 07/03/2020 09:37:11 -------------------------------------------------------------------------------- Multi Wound Chart Details Patient Name: Date of Service: [name] 07/03/2020 9:15 A M Medical Record Number: [id number] Patient Account Number: [id number] Date of Birth/Sex: Treating RN: 11-06-1970 (50 y.o. 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)]  [N/A:N/A] Secondary Etiology: [1:01/16/2020] [N/A:N/A] Date Acquired: [1:12] [N/A:N/A] Weeks of Treatment: [1:Open] [N/A:N/A] Wound Status: [1:4.1x4.4x1] [N/A:N/A] Measurements L x W x D (cm) [1:14.169] [N/A:N/A] A (cm) : rea [1:14.169] [N/A:N/A] Volume (cm) : [1:-122.70%] [N/A:N/A] % Reduction in A rea: [1:-102.50%] [N/A:N/A] % Reduction in Volume: [1:Full Thickness Without Exposed] [N/A:N/A] Classification: [1:Support Structures] Treatment Notes Electronic Signature(s) Signed: 07/03/2020 4:56:05 PM By: Levan Hurst RN, BSN Signed: 07/03/2020 4:58:19 PM By: [name] MD Entered  By: [name] on 07/03/2020 10:21:28 -------------------------------------------------------------------------------- Multi-Disciplinary Care Plan Details Patient Name: Date of Service: [name] 07/03/2020 9:15 A M Medical Record Number: [id number] Patient Account Number: [id number] Date of Birth/Sex: Treating RN: [birth year] (50 y.o. [name] Primary Care Charidy Cappelletti: [name] Other Clinician: Referring Shalinda Burkholder: Treating Tranae Laramie/Extender: [name] in Treatment: 12 Active Inactive Wound/Skin Impairment Nursing Diagnoses: Impaired tissue integrity Knowledge deficit related to smoking impact on wound healing Knowledge deficit related to ulceration/compromised skin integrity Goals: Patient will demonstrate a reduced rate of smoking or cessation of smoking Date Initiated: 04/09/2020 Date Inactivated: 06/18/2020 Target Resolution Date: 06/28/2020 Goal Status: Met Patient will have a decrease in wound volume by X% from date: (specify in notes) Date Initiated: 04/09/2020 Date Inactivated: 07/03/2020 Target Resolution Date: 07/05/2020 Goal Status: Met Patient/caregiver will verbalize understanding of skin care regimen Date Initiated: 04/09/2020 Target Resolution Date: 08/02/2020 Goal Status: Active Ulcer/skin breakdown will have a volume  reduction of 30% by week 4 Date Initiated: 04/09/2020 Date Inactivated: 05/14/2020 Target Resolution Date: 05/11/2020 Unmet Reason: see wound Goal Status: Unmet meaurements. Interventions: Assess patient/caregiver ability to obtain necessary supplies Assess patient/caregiver ability to perform ulcer/skin care regimen upon admission and as needed Assess ulceration(s) every visit Provide education on smoking Notes: Electronic Signature(s) Signed: 07/03/2020 4:56:05 PM By: Levan Hurst RN, BSN Entered By: Levan Hurst on 07/03/2020 10:12:32 -------------------------------------------------------------------------------- Pain Assessment Details Patient Name: Date of Service: [name] 07/03/2020 9:15 A M Medical Record Number: [id number] Patient Account Number: [id number] Date of Birth/Sex: Treating RN: Mar 31, 1970 (50 y.o. [name], [name] Primary Care Cortavius Montesinos: [name] Other Clinician: Referring Avan Gullett: Treating Jenea Dake/Extender: [name] in Treatment: 12 Active Problems Location of Pain Severity and Description of Pain Patient Has Paino Yes Site Locations Rate the pain. Current Pain Level: 3 Worst Pain Level: 10 Least Pain Level: 0 Tolerable Pain Level: 3 Pain Management and Medication Current Pain Management: Electronic Signature(s) Signed: 07/03/2020 6:02:58 PM By: Rhae Hammock RN Entered By: Rhae Hammock on 07/03/2020 09:36:59 -------------------------------------------------------------------------------- Patient/Caregiver Education Details Patient Name: Date of Service: [name] 6/29/2022andnbsp9:15 A M Medical Record Number: [id number] Patient Account Number: [id number] Date of Birth/Gender: Treating RN: Mar 03, 1970 (50 y.o. [name] Primary Care Physician: [name] Other Clinician: Referring Physician: Treating Physician/Extender: [name] in Treatment: 12 Education Assessment Education Provided To: Patient Education Topics Provided Wound/Skin Impairment: Methods: Explain/Verbal Responses: State content correctly [employer]) Signed: 07/03/2020 4:56:05 PM By: Levan Hurst RN, BSN Entered By: Levan Hurst on 07/03/2020 10:13:36 -------------------------------------------------------------------------------- Wound Assessment Details Patient Name: Date of Service: [name] 07/03/2020 9:15 A M Medical Record Number: [id number] Patient Account Number: [id number] Date of Birth/Sex: Treating RN: [birth year] (50 y.o. [name], [name] Primary Care Hallee Mckenny: [name] Other Clinician: Referring Sajjad Honea: Treating Tashara Suder/Extender: [name] in Treatment: 12 Wound Status Wound Number: 1 Primary Open Surgical Wound Etiology: Wound Location: Left Chest Secondary Necrosis (Radiation) Wounding Event: Surgical Injury Etiology: Date Acquired: 01/16/2020 Wound Status: Open Weeks Of Treatment: 12 Comorbid Hypertension, Osteoarthritis, Received Chemotherapy, Clustered Wound: No History: Received Radiation Photos Wound Measurements Length: (cm) 4.1 Width: (cm) 4.4 Depth: (cm) 1 Area: (cm) 14.169 Volume: (cm) 14.169 % Reduction in Area: -122.7% % Reduction in Volume: -102.5% Wound Description Classification: Full Thickness Without Exposed Support Structur es Treatment Notes Wound #1 (Chest) Wound  Laterality: Left Cleanser Normal Saline Discharge Instruction: Cleanse the wound with Normal Saline prior to applying a clean dressing using gauze sponges, not tissue or cotton balls. Peri-Wound Care Skin Prep Discharge Instruction: Use skin prep as directed Topical Primary Dressing Promogran Prisma Matrix, 4.34 (sq in) (silver collagen) Discharge Instruction: Moisten collagen with saline or hydrogel Secondary Dressing Zetuvit Plus  Silicone Border Dressing 4x4 (in/in) Discharge Instruction: Apply silicone border over primary dressing as directed. Secured With Compression Wrap Compression Stockings Environmental education officer) Signed: 07/03/2020 5:02:46 PM By: Sandre Kitty Signed: 07/03/2020 6:02:58 PM By: Rhae Hammock RN Entered By: Sandre Kitty on 07/03/2020 16:30:35 -------------------------------------------------------------------------------- Vitals Details Patient Name: Date of Service: [name] 07/03/2020 9:15 A M Medical Record Number: [id number] Patient Account Number: [id number] Date of Birth/Sex: Treating RN: 1970-05-08 (50 y.o. [name], [name] Primary Care Hajar Penninger: [name] Other Clinician: Referring Nima Bamburg: Treating Lennis Rader/Extender: [name] in Treatment: 12 Vital Signs Time Taken: 09:37 Temperature (F): 98.8 Height (in): 67 Pulse (bpm): 68 Weight (lbs): 257 Respiratory Rate (breaths/min): 17 Body Mass Index (BMI): 40.2 Blood Pressure (mmHg): 122/81 Reference Range: 80 - 120 mg / dl Electronic Signature(s) Signed: 07/03/2020 6:02:58 PM By: Rhae Hammock RN Entered By: Rhae Hammock on 07/03/2020 09:38:17

## 2020-07-03 NOTE — Telephone Encounter (Signed)
Scheduled appts per 6/29 sch msg. Pt aware.  

## 2020-07-04 ENCOUNTER — Other Ambulatory Visit (HOSPITAL_COMMUNITY): Payer: Self-pay

## 2020-07-05 ENCOUNTER — Telehealth: Payer: Self-pay | Admitting: Oncology

## 2020-07-05 ENCOUNTER — Encounter: Payer: Self-pay | Admitting: Oncology

## 2020-07-05 NOTE — Telephone Encounter (Signed)
Confirmed with RN Val that pt will still need lab/flush next week. I added the appt back on the sch. Called pt, no answer. Left msg with appt date and time.

## 2020-07-09 ENCOUNTER — Inpatient Hospital Stay: Payer: Medicaid Other

## 2020-07-09 ENCOUNTER — Inpatient Hospital Stay: Payer: Medicaid Other | Admitting: Oncology

## 2020-07-09 ENCOUNTER — Inpatient Hospital Stay: Payer: Medicaid Other | Attending: Oncology

## 2020-07-09 ENCOUNTER — Other Ambulatory Visit: Payer: Self-pay

## 2020-07-09 DIAGNOSIS — Z1501 Genetic susceptibility to malignant neoplasm of breast: Secondary | ICD-10-CM

## 2020-07-09 DIAGNOSIS — Z1509 Genetic susceptibility to other malignant neoplasm: Secondary | ICD-10-CM

## 2020-07-09 DIAGNOSIS — C50412 Malignant neoplasm of upper-outer quadrant of left female breast: Secondary | ICD-10-CM

## 2020-07-09 DIAGNOSIS — Z95828 Presence of other vascular implants and grafts: Secondary | ICD-10-CM

## 2020-07-09 DIAGNOSIS — Z9013 Acquired absence of bilateral breasts and nipples: Secondary | ICD-10-CM

## 2020-07-09 DIAGNOSIS — Z171 Estrogen receptor negative status [ER-]: Secondary | ICD-10-CM

## 2020-07-09 DIAGNOSIS — Z6841 Body Mass Index (BMI) 40.0 and over, adult: Secondary | ICD-10-CM

## 2020-07-09 DIAGNOSIS — K429 Umbilical hernia without obstruction or gangrene: Secondary | ICD-10-CM

## 2020-07-09 DIAGNOSIS — C50912 Malignant neoplasm of unspecified site of left female breast: Secondary | ICD-10-CM

## 2020-07-09 LAB — COMPREHENSIVE METABOLIC PANEL
ALT: 13 U/L (ref 0–44)
AST: 15 U/L (ref 15–41)
Albumin: 3.3 g/dL — ABNORMAL LOW (ref 3.5–5.0)
Alkaline Phosphatase: 95 U/L (ref 38–126)
Anion gap: 8 (ref 5–15)
BUN: 7 mg/dL (ref 6–20)
CO2: 28 mmol/L (ref 22–32)
Calcium: 8.9 mg/dL (ref 8.9–10.3)
Chloride: 104 mmol/L (ref 98–111)
Creatinine, Ser: 0.72 mg/dL (ref 0.44–1.00)
GFR, Estimated: >60 mL/min (ref >60)
Glucose, Bld: 129 mg/dL — ABNORMAL HIGH (ref 70–99)
Potassium: 3.5 mmol/L (ref 3.5–5.1)
Sodium: 140 mmol/L (ref 135–145)
Total Bilirubin: 0.6 mg/dL (ref 0.3–1.2)
Total Protein: 7 g/dL (ref 6.5–8.1)

## 2020-07-09 LAB — CBC WITH DIFFERENTIAL/PLATELET
Abs Immature Granulocytes: 0.01 10*3/uL (ref 0.00–0.07)
Basophils Absolute: 0 10*3/uL (ref 0.0–0.1)
Basophils Relative: 1 %
Eosinophils Absolute: 0.1 10*3/uL (ref 0.0–0.5)
Eosinophils Relative: 3 %
HCT: 35.7 % — ABNORMAL LOW (ref 36.0–46.0)
Hemoglobin: 11.9 g/dL — ABNORMAL LOW (ref 12.0–15.0)
Immature Granulocytes: 0 %
Lymphocytes Relative: 42 %
Lymphs Abs: 1.4 10*3/uL (ref 0.7–4.0)
MCH: 28.9 pg (ref 26.0–34.0)
MCHC: 33.3 g/dL (ref 30.0–36.0)
MCV: 86.7 fL (ref 80.0–100.0)
Monocytes Absolute: 0.2 10*3/uL (ref 0.1–1.0)
Monocytes Relative: 7 %
Neutro Abs: 1.6 10*3/uL — ABNORMAL LOW (ref 1.7–7.7)
Neutrophils Relative %: 47 %
Platelets: 180 10*3/uL (ref 150–400)
RBC: 4.12 MIL/uL (ref 3.87–5.11)
RDW: 13.7 % (ref 11.5–15.5)
WBC: 3.4 10*3/uL — ABNORMAL LOW (ref 4.0–10.5)
nRBC: 0 % (ref 0.0–0.2)

## 2020-07-09 MED ORDER — SODIUM CHLORIDE 0.9% FLUSH
10.0000 mL | Freq: Once | INTRAVENOUS | Status: AC
  Administered 2020-07-09: 10 mL
  Filled 2020-07-09: qty 10

## 2020-07-09 MED ORDER — HEPARIN SOD (PORK) LOCK FLUSH 100 UNIT/ML IV SOLN
500.0000 [IU] | Freq: Once | INTRAVENOUS | Status: AC
Start: 2020-07-09 — End: 2020-07-09
  Administered 2020-07-09: 500 [IU]
  Filled 2020-07-09: qty 5

## 2020-07-10 ENCOUNTER — Other Ambulatory Visit: Payer: Self-pay | Admitting: Hematology and Oncology

## 2020-07-10 MED FILL — Gabapentin Cap 100 MG: ORAL | 30 days supply | Qty: 120 | Fill #1 | Status: AC

## 2020-07-11 ENCOUNTER — Other Ambulatory Visit: Payer: Self-pay

## 2020-07-11 ENCOUNTER — Other Ambulatory Visit (HOSPITAL_COMMUNITY): Payer: Self-pay

## 2020-07-11 ENCOUNTER — Encounter (HOSPITAL_COMMUNITY): Payer: Self-pay | Admitting: Plastic Surgery

## 2020-07-11 ENCOUNTER — Other Ambulatory Visit: Payer: Self-pay | Admitting: Oncology

## 2020-07-11 MED ORDER — TRAMADOL HCL 50 MG PO TABS
ORAL_TABLET | Freq: Four times a day (QID) | ORAL | 0 refills | Status: DC | PRN
  Filled 2020-07-11: qty 30, 4d supply, fill #0

## 2020-07-11 NOTE — Progress Notes (Signed)
PCP - Dr. Wynetta Emery  Cardiologist - per pt Dr. Aundra Dubin followed her throughout her chemotherapy in 2018-2019 but she hasn't seen him since completing her chemo.  Oncologist - [name] EKG - DOS Chest x-ray - 04/15/20 ECHO - 06/01/17 Cardiac Cath - deneis CPAP - no  COVID TEST- n/a  Anesthesia review: n/a  -------------  SDW INSTRUCTIONS:  Your procedure is scheduled on Monday 7/11. Please report to [facility] Main Entrance "A" at 08:00 A.M., and check in at the Admitting office. Call this number if you have problems the morning of surgery: [phone]   Remember: Do not eat or drink after midnight the night before your surgery   Medications to take morning of surgery with a sip of water include: gabapentin (NEURONTIN)  traMADol (ULTRAM)   As of today, STOP taking any Aspirin (unless otherwise instructed by your surgeon), Aleve, Naproxen, Ibuprofen, Motrin, Advil, Goody's, BC's, all herbal medications, fish oil, and all vitamins.    The Morning of Surgery Do not wear jewelry, make-up or nail polish. Do not wear lotions, powders, or perfumes, or deodorant Do not shave 48 hours prior to surgery.   Do not bring valuables to the hospital. [facility] is not responsible for any belongings or valuables.  If you are a smoker, DO NOT Smoke 24 hours prior to surgery If you wear a CPAP at night please bring your mask the morning of surgery  Remember that you must have someone to transport you home after your surgery, and remain with you for 24 hours if you are discharged the same day.  Please bring cases for contacts, glasses, hearing aids, dentures or bridgework because it cannot be worn into surgery.   Patients discharged the day of surgery will not be allowed to drive home.   Please shower the NIGHT BEFORE/MORNING OF SURGERY (use antibacterial soap like DIAL soap if possible). Wear comfortable clothes the morning of surgery. Oral Hygiene is also important to reduce your risk of  infection.  Remember - BRUSH YOUR TEETH THE MORNING OF SURGERY WITH YOUR REGULAR TOOTHPASTE  Patient denies shortness of breath, fever, cough and chest pain.

## 2020-07-12 ENCOUNTER — Other Ambulatory Visit (HOSPITAL_COMMUNITY): Payer: Self-pay

## 2020-07-13 ENCOUNTER — Other Ambulatory Visit (HOSPITAL_COMMUNITY): Payer: Self-pay

## 2020-07-14 NOTE — Patient Instructions (Signed)
Patient will call for any changes or questions

## 2020-07-14 NOTE — H&P (View-Only) (Signed)
06/26/20     Patient ID: [name], female    DOB: March 14, 1970, 50 y.o.   MRN: [id number]  Chief Complaint  Patient presents with   Pre-op Exam    No diagnosis found. Left breast Cancer   History of Present Illness: [name] is a 50 y.o.  female  with a history of left breast cancer.  She presents for preoperative evaluation for upcoming procedure, Debridement of Left chest wound and rotational flap closure, Excision of excess right axillary tissue.  scheduled for 07/15/20 with Dr. Claudia Desanctis.  The patient has had problems with anesthesia. Aspiration-postop in 2010 or 2011 summary of Previous Visit: consult for left chest wound closure & right axillary excess tissue.  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 (Patient taking differently: Take 1 mg by mouth every evening.), Disp: 90 tablet, Rfl: 4   gabapentin (NEURONTIN) 300 MG capsule, TAKE 2 CAPSULES BY MOUTH EVERY NIGHT AT BEDTIME (Patient taking differently: Take 900 mg by mouth at bedtime.), Disp: 90 capsule, Rfl: 4   potassium chloride SA (KLOR-CON) 20 MEQ tablet, TAKE 1 TABLET BY MOUTH ONCE A DAY (Patient taking differently: Take 20 mEq by mouth at bedtime.), Disp: 30 tablet, Rfl: 2   prochlorperazine (COMPAZINE) 10 MG tablet, TAKE 1 TABLET BY MOUTH EVERY 6 HOURS AS NEEDED FOR NAUSEA OR VOMITING (Patient taking differently: Take 10 mg by mouth every 6 (six) hours as needed for nausea or vomiting.), Disp: 30 tablet, Rfl: 1   senna-docusate (SENOKOT-S) 8.6-50 MG tablet, Take 2 tablets by mouth at bedtime., Disp: , Rfl:    COLLAGEN MATRIX MESHED, PORC, EX, Apply 1 each topically every other  day. Collagen Wound Packing every other day, Disp: , Rfl:    gabapentin (NEURONTIN) 100 MG capsule, TAKE 2 CAPSULES BY MOUTH IN THE MORNING AND 2 CAPSULES IN THE AFTERNOON (Patient taking differently: Take 100 mg by mouth 2 (two) times daily as needed (pain/neuropathy).), Disp: 120 capsule, Rfl: 3   HYDROcodone-acetaminophen (NORCO/VICODIN) 5-325 MG tablet, TAKE 1/2-1 TABLET BY MOUTH EVERY 4 HOURS AS NEEDED FOR PAIN (Patient not taking: No sig reported), Disp: 40 tablet, Rfl: 0   ibuprofen (ADVIL) 800 MG tablet, TAKE 1 TABLET BY MOUTH 3 TIMES DAILY AS NEEDED (Patient taking differently: Take 800 mg by mouth 3 (three) times daily as needed (pain).), Disp: 40 tablet, Rfl: 1   lidocaine-prilocaine (EMLA) cream, APPLY TO AFFECTED AREA ONCE AS DIRECTED (Patient taking differently: Apply 1 application topically daily as needed (prior to lab draws/port flushes).), Disp: 30 g, Rfl: 3   LORazepam (ATIVAN) 1 MG tablet, Take 1 tablet by mouth once a day as needed (Patient taking differently: Take 1 mg by mouth daily.), Disp: 25 tablet, Rfl: 0   olaparib (LYNPARZA) 150 MG tablet, Take 2 tablets (300 mg total) by mouth 2 (two) times daily. Swallow whole. May take with food to decrease nausea and vomiting.Start May 23, 2020 (Patient not taking: No sig reported), Disp: 120 tablet, Rfl: 6   traMADol (ULTRAM) 50 MG tablet, TAKE 1 TO 2 TABLETS BY MOUTH EVERY 6 HOURS AS NEEDED., Disp: 30 tablet, Rfl: 0  Past Medical Problems: Past  Medical History:  Diagnosis Date   Arthritis    Breast cancer ([place])    Left Breast Cancer   Breast cancer ([place]) 38/10/1749   left    Complication of anesthesia    DAY SURGERY 2010 OR 2011 ASPIRATED AND STAYED OVERNIGHT   History of radiation therapy 03/11/17- 04/21/17   Left breast, 2 Gy in 25 fractions for a total dose of 50 Gy, Boost, 2 Gy in 5 fractions for a total dose of 10 Gy   Hypertension    OFF MEDS  SINCE CHEMO X 5 MONTHS   Malignant neoplasm of upper-outer quadrant of left  breast in female, estrogen receptor negative ([place]) 06/23/2016   Personal history of chemotherapy 2019   Left Breast Cancer  07-2016-12-2016   Personal history of radiation therapy 2019   Left Breast Cancer    Past Surgical History: Past Surgical History:  Procedure Laterality Date   ANKLE SURGERY Right    BREAST LUMPECTOMY Left 01/27/2017   BREAST LUMPECTOMY WITH RADIOACTIVE SEED AND SENTINEL LYMPH NODE BIOPSY Left 01/27/2017   Procedure: LEFT BREAST LUMPECTOMY WITH RADIOACTIVE SEED LOCALIZATION, LEFT AXILLARY DEEP LYMPH NODE BIOPSY WITH RADIOACTIVE SEED LOCALIZATION, LEFT AXILLARY SENTINEL LYMPH NODE MAPPING AND BIOPSY WITH BLUE DYE INJECTION;  Surgeon: [name], MD;  Location: [facility];  Service: General;  Laterality: Left;   COLONOSCOPY WITH PROPOFOL N/A 02/01/2020   Procedure: COLONOSCOPY WITH PROPOFOL;  Surgeon: Milus Banister, MD;  [facility];  Service: Endoscopy;  Laterality: N/A;   DEBRIDEMENT AND CLOSURE WOUND Left 03/28/2020   Procedure: DEBRIDEMENT AND CLOSURE OF MASTECTOMY WOUND;  Surgeon: Rolm Bookbinder, MD;  [facility];  Service: General;  Laterality: Left;   ENDOSCOPIC MUCOSAL RESECTION  02/01/2020   Procedure: ENDOSCOPIC MUCOSAL RESECTION;  Surgeon: Milus Banister, MD;  [facility];  Service: Endoscopy;;   HEMOSTASIS CLIP PLACEMENT  02/01/2020   Procedure: HEMOSTASIS CLIP PLACEMENT;  Surgeon: Milus Banister, MD;  [facility];  Service: Endoscopy;;   IR FLUORO GUIDE PORT INSERTION RIGHT  07/14/2016   IR US GUIDE VASC ACCESS RIGHT  07/14/2016   MASTECTOMY W/ SENTINEL NODE BIOPSY Bilateral 01/09/2020   Procedure: BILATERAL MASTECTOMY WITH LEFT AXILLARY SENTINEL LYMPH NODE BIOPSY;  Surgeon: Rolm Bookbinder, MD;  Location: [facility];  Service: General;  Laterality: Bilateral;   PORT-A-CATH REMOVAL Right 01/27/2017   Procedure: REMOVAL PORT-A-CATH;  Surgeon: [name], MD;  Location: [place] [place];  Service:  General;  Laterality: Right;   PORTACATH PLACEMENT Right 01/09/2020   Procedure: INSERTION PORT-A-CATH;  Surgeon: Rolm Bookbinder, MD;  Location: [facility];  Service: General;  Laterality: Right;   SUBMUCOSAL LIFTING INJECTION  02/01/2020   Procedure: SUBMUCOSAL LIFTING INJECTION;  Surgeon: Milus Banister, MD;  Location: [facility];  Service: Endoscopy;;   TUBAL LIGATION     WISDOM TOOTH EXTRACTION      Social History: Social History   Socioeconomic History   Marital status: Married    Spouse name: Not on file   Number of children: Not on file   Years of education: Not on file   Highest education level: Not on file  Occupational History   Not on file  Tobacco Use   Smoking status: Never   Smokeless tobacco: Never  Vaping Use   Vaping Use: Never used  Substance and Sexual Activity   Alcohol use: No   Drug use: No   Sexual activity: Not Currently  Other Topics Concern   Not  on file  Social History Narrative   Not on file   Social Determinants of Health   Financial Resource Strain: Not on file  Food Insecurity: Not on file  Transportation Needs: Not on file  Physical Activity: Not on file  Stress: Not on file  Social Connections: Not on file  Intimate Partner Violence: Not on file    Family History: Family History  Problem Relation Age of Onset   Diabetes Mother    Hypertension Mother    Arthritis Mother    Colon cancer Father 49       died .34 metastatic at time of diagnosis   Diabetes Father    Heart disease Father    Hypertension Father    Breast cancer Maternal Grandmother 19       d.32s   Breast cancer Maternal Aunt 52   Cervical cancer Maternal Aunt 55   Breast cancer Maternal Aunt 46       d.50s   Cancer Maternal Uncle        d.62s unspecified type of cancer   Breast cancer Cousin 39       paternal first-cousin (daughter of unaffected aunt)   Cancer Maternal Aunt 26       "Female Cancer"   Cancer Maternal Aunt         unknown cancer   Brain cancer Daughter 20   Esophageal cancer Other    Colon polyps Neg Hx    Rectal cancer Neg Hx    Stomach cancer Neg Hx     Review of Systems: ROS Denies any of the following: Dm cardiac Hx- had ECG 2019= neg No sleep apnea No varicose veins, no hx of DVT/PE No swelling of feet/ankles or legs   Physical Exam: Vital Signs BP 135/84 (BP Location: Right Arm, Patient Position: Sitting, Cuff Size: Large)   Pulse 93   Ht 5\' 7"  (1.702 m)   Wt 261 lb (118.4 kg)   LMP  (LMP Unknown)   SpO2 97%   BMI 40.88 kg/m   Physical Exam  Constitutional:      General: Not in acute distress.    Appearance: Normal appearance. Not ill-appearing.  HENT:     Head: Normocephalic and atraumatic.  Eyes:     Pupils: Pupils are equal, round Neck:     Musculoskeletal: Normal range of motion.  Cardiovascular:     Rate and Rhythm: Normal rate    Pulses: Normal pulses.  Pulmonary:     Effort: Pulmonary effort is normal. No respiratory distress.  Abdominal:     General: Abdomen is flat. There is no distension.  Musculoskeletal: Normal range of motion.  Skin:    General: Skin is warm and dry.     Findings: No erythema or rash. Left chest wound  Neurological:     General: No focal deficit present.     Mental Status: Alert and oriented to person, place, and time. Mental status is at baseline.     Motor: No weakness.  Psychiatric:        Mood and Affect: Mood normal.        Behavior: Behavior normal.    Assessment/Plan: The patient is scheduled for left chest wound debridement and rotational flap closure, right excision of excess axillary tissue. with Dr. Claudia Desanctis.  Risks, benefits, and alternatives of procedure discussed, questions answered and consent obtained.    Smoking Status: non-smoker; Counseling Given? N/A Last Mammogram: 2018  Results left breast cancer  Caprini Score: 4; Risk  Factors include: , BMI = 38.89, and length of planned surgery. Recommendation for  mechanical SCD pharmacological prophylaxis. Encourage early ambulation.   Pictures obtained: yes  Post-op Rx sent to pharmacy: Dr. Claudia Desanctis  Patient was provided with the  General Surgical Risk consent document and Pain Medication Agreement prior to their appointment.  They had adequate time to read through the risk consent documents and Pain Medication Agreement. We also discussed them in person together during this preop appointment. All of their questions were answered to their satisfaction.  Recommended calling if they have any further questions.  Risk consent form and Pain Medication Agreement to be scanned into patient's chart. copy of consent scanned into chart    Electronically signed by: [place], RN 07/14/2020 12:32 PM

## 2020-07-14 NOTE — Progress Notes (Signed)
06/26/20     Patient ID: [name], female    DOB: 1970/12/23, 50 y.o.   MRN: [id number]  Chief Complaint  Patient presents with   Pre-op Exam    No diagnosis found. Left breast Cancer   History of Present Illness: [name] is a 50 y.o.  female  with a history of left breast cancer.  She presents for preoperative evaluation for upcoming procedure, Debridement of Left chest wound and rotational flap closure, Excision of excess right axillary tissue.  scheduled for 07/15/20 with Dr. Claudia Desanctis.  The patient has had problems with anesthesia. Aspiration-postop in 2010 or 2011 summary of Previous Visit: consult for left chest wound closure & right axillary excess tissue.  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 (Patient taking differently: Take 1 mg by mouth every evening.), Disp: 90 tablet, Rfl: 4   gabapentin (NEURONTIN) 300 MG capsule, TAKE 2 CAPSULES BY MOUTH EVERY NIGHT AT BEDTIME (Patient taking differently: Take 900 mg by mouth at bedtime.), Disp: 90 capsule, Rfl: 4   potassium chloride SA (KLOR-CON) 20 MEQ tablet, TAKE 1 TABLET BY MOUTH ONCE A DAY (Patient taking differently: Take 20 mEq by mouth at bedtime.), Disp: 30 tablet, Rfl: 2   prochlorperazine (COMPAZINE) 10 MG tablet, TAKE 1 TABLET BY MOUTH EVERY 6 HOURS AS NEEDED FOR NAUSEA OR VOMITING (Patient taking differently: Take 10 mg by mouth every 6 (six) hours as needed for nausea or vomiting.), Disp: 30 tablet, Rfl: 1   senna-docusate (SENOKOT-S) 8.6-50 MG tablet, Take 2 tablets by mouth at bedtime., Disp: , Rfl:    COLLAGEN MATRIX MESHED, PORC, EX, Apply 1 each topically every other  day. Collagen Wound Packing every other day, Disp: , Rfl:    gabapentin (NEURONTIN) 100 MG capsule, TAKE 2 CAPSULES BY MOUTH IN THE MORNING AND 2 CAPSULES IN THE AFTERNOON (Patient taking differently: Take 100 mg by mouth 2 (two) times daily as needed (pain/neuropathy).), Disp: 120 capsule, Rfl: 3   HYDROcodone-acetaminophen (NORCO/VICODIN) 5-325 MG tablet, TAKE 1/2-1 TABLET BY MOUTH EVERY 4 HOURS AS NEEDED FOR PAIN (Patient not taking: No sig reported), Disp: 40 tablet, Rfl: 0   ibuprofen (ADVIL) 800 MG tablet, TAKE 1 TABLET BY MOUTH 3 TIMES DAILY AS NEEDED (Patient taking differently: Take 800 mg by mouth 3 (three) times daily as needed (pain).), Disp: 40 tablet, Rfl: 1   lidocaine-prilocaine (EMLA) cream, APPLY TO AFFECTED AREA ONCE AS DIRECTED (Patient taking differently: Apply 1 application topically daily as needed (prior to lab draws/port flushes).), Disp: 30 g, Rfl: 3   LORazepam (ATIVAN) 1 MG tablet, Take 1 tablet by mouth once a day as needed (Patient taking differently: Take 1 mg by mouth daily.), Disp: 25 tablet, Rfl: 0   olaparib (LYNPARZA) 150 MG tablet, Take 2 tablets (300 mg total) by mouth 2 (two) times daily. Swallow whole. May take with food to decrease nausea and vomiting.Start May 23, 2020 (Patient not taking: No sig reported), Disp: 120 tablet, Rfl: 6   traMADol (ULTRAM) 50 MG tablet, TAKE 1 TO 2 TABLETS BY MOUTH EVERY 6 HOURS AS NEEDED., Disp: 30 tablet, Rfl: 0  Past Medical Problems: Past  Medical History:  Diagnosis Date   Arthritis    Breast cancer ([place])    Left Breast Cancer   Breast cancer ([facility]) 96/22/2979   left    Complication of anesthesia    DAY SURGERY 2010 OR 2011 ASPIRATED AND STAYED OVERNIGHT   History of radiation therapy 03/11/17- 04/21/17   Left breast, 2 Gy in 25 fractions for a total dose of 50 Gy, Boost, 2 Gy in 5 fractions for a total dose of 10 Gy   Hypertension    OFF MEDS  SINCE CHEMO X 5 MONTHS   Malignant neoplasm of upper-outer quadrant of left  breast in female, estrogen receptor negative ([place]) 06/23/2016   Personal history of chemotherapy 2019   Left Breast Cancer  07-2016-12-2016   Personal history of radiation therapy 2019   Left Breast Cancer    Past Surgical History: Past Surgical History:  Procedure Laterality Date   ANKLE SURGERY Right    BREAST LUMPECTOMY Left 01/27/2017   BREAST LUMPECTOMY WITH RADIOACTIVE SEED AND SENTINEL LYMPH NODE BIOPSY Left 01/27/2017   Procedure: LEFT BREAST LUMPECTOMY WITH RADIOACTIVE SEED LOCALIZATION, LEFT AXILLARY DEEP LYMPH NODE BIOPSY WITH RADIOACTIVE SEED LOCALIZATION, LEFT AXILLARY SENTINEL LYMPH NODE MAPPING AND BIOPSY WITH BLUE DYE INJECTION;  Surgeon: [name], MD;  Location: [facility];  Service: General;  Laterality: Left;   COLONOSCOPY WITH PROPOFOL N/A 02/01/2020   Procedure: COLONOSCOPY WITH PROPOFOL;  Surgeon: Milus Banister, MD;  [facility];  Service: Endoscopy;  Laterality: N/A;   DEBRIDEMENT AND CLOSURE WOUND Left 03/28/2020   Procedure: DEBRIDEMENT AND CLOSURE OF MASTECTOMY WOUND;  Surgeon: Rolm Bookbinder, MD;  [facility];  Service: General;  Laterality: Left;   ENDOSCOPIC MUCOSAL RESECTION  02/01/2020   Procedure: ENDOSCOPIC MUCOSAL RESECTION;  Surgeon: Milus Banister, MD;  [facility];  Service: Endoscopy;;   HEMOSTASIS CLIP PLACEMENT  02/01/2020   Procedure: HEMOSTASIS CLIP PLACEMENT;  Surgeon: Milus Banister, MD;  [facility];  Service: Endoscopy;;   IR FLUORO GUIDE PORT INSERTION RIGHT  07/14/2016   IR US GUIDE VASC ACCESS RIGHT  07/14/2016   MASTECTOMY W/ SENTINEL NODE BIOPSY Bilateral 01/09/2020   Procedure: BILATERAL MASTECTOMY WITH LEFT AXILLARY SENTINEL LYMPH NODE BIOPSY;  Surgeon: Rolm Bookbinder, MD;  Location: [facility];  Service: General;  Laterality: Bilateral;   PORT-A-CATH REMOVAL Right 01/27/2017   Procedure: REMOVAL PORT-A-CATH;  Surgeon: [name], MD;  Location: [facility];  Service:  General;  Laterality: Right;   PORTACATH PLACEMENT Right 01/09/2020   Procedure: INSERTION PORT-A-CATH;  Surgeon: Rolm Bookbinder, MD;  Location: [facility];  Service: General;  Laterality: Right;   SUBMUCOSAL LIFTING INJECTION  02/01/2020   Procedure: SUBMUCOSAL LIFTING INJECTION;  Surgeon: Milus Banister, MD;  Location: [facility];  Service: Endoscopy;;   TUBAL LIGATION     WISDOM TOOTH EXTRACTION      Social History: Social History   Socioeconomic History   Marital status: Married    Spouse name: Not on file   Number of children: Not on file   Years of education: Not on file   Highest education level: Not on file  Occupational History   Not on file  Tobacco Use   Smoking status: Never   Smokeless tobacco: Never  Vaping Use   Vaping Use: Never used  Substance and Sexual Activity   Alcohol use: No   Drug use: No   Sexual activity: Not Currently  Other Topics Concern   Not  on file  Social History Narrative   Not on file   Social Determinants of Health   Financial Resource Strain: Not on file  Food Insecurity: Not on file  Transportation Needs: Not on file  Physical Activity: Not on file  Stress: Not on file  Social Connections: Not on file  Intimate Partner Violence: Not on file    Family History: Family History  Problem Relation Age of Onset   Diabetes Mother    Hypertension Mother    Arthritis Mother    Colon cancer Father 1       died .83 metastatic at time of diagnosis   Diabetes Father    Heart disease Father    Hypertension Father    Breast cancer Maternal Grandmother 19       d.59s   Breast cancer Maternal Aunt 52   Cervical cancer Maternal Aunt 55   Breast cancer Maternal Aunt 46       d.50s   Cancer Maternal Uncle        d.62s unspecified type of cancer   Breast cancer Cousin 49       paternal first-cousin (daughter of unaffected aunt)   Cancer Maternal Aunt 64       "Female Cancer"   Cancer Maternal Aunt         unknown cancer   Brain cancer Daughter 47   Esophageal cancer Other    Colon polyps Neg Hx    Rectal cancer Neg Hx    Stomach cancer Neg Hx     Review of Systems: ROS Denies any of the following: Dm cardiac Hx- had ECG 2019= neg No sleep apnea No varicose veins, no hx of DVT/PE No swelling of feet/ankles or legs   Physical Exam: Vital Signs BP 135/84 (BP Location: Right Arm, Patient Position: Sitting, Cuff Size: Large)   Pulse 93   Ht 5\' 7"  (1.702 m)   Wt 261 lb (118.4 kg)   LMP  (LMP Unknown)   SpO2 97%   BMI 40.88 kg/m   Physical Exam  Constitutional:      General: Not in acute distress.    Appearance: Normal appearance. Not ill-appearing.  HENT:     Head: Normocephalic and atraumatic.  Eyes:     Pupils: Pupils are equal, round Neck:     Musculoskeletal: Normal range of motion.  Cardiovascular:     Rate and Rhythm: Normal rate    Pulses: Normal pulses.  Pulmonary:     Effort: Pulmonary effort is normal. No respiratory distress.  Abdominal:     General: Abdomen is flat. There is no distension.  Musculoskeletal: Normal range of motion.  Skin:    General: Skin is warm and dry.     Findings: No erythema or rash. Left chest wound  Neurological:     General: No focal deficit present.     Mental Status: Alert and oriented to person, place, and time. Mental status is at baseline.     Motor: No weakness.  Psychiatric:        Mood and Affect: Mood normal.        Behavior: Behavior normal.    Assessment/Plan: The patient is scheduled for left chest wound debridement and rotational flap closure, right excision of excess axillary tissue. with Dr. Claudia Desanctis.  Risks, benefits, and alternatives of procedure discussed, questions answered and consent obtained.    Smoking Status: non-smoker; Counseling Given? N/A Last Mammogram: 2018  Results left breast cancer  Caprini Score: 4; Risk  Factors include: , BMI = 38.89, and length of planned surgery. Recommendation for  mechanical SCD pharmacological prophylaxis. Encourage early ambulation.   Pictures obtained: yes  Post-op Rx sent to pharmacy: Dr. Claudia Desanctis  Patient was provided with the  General Surgical Risk consent document and Pain Medication Agreement prior to their appointment.  They had adequate time to read through the risk consent documents and Pain Medication Agreement. We also discussed them in person together during this preop appointment. All of their questions were answered to their satisfaction.  Recommended calling if they have any further questions.  Risk consent form and Pain Medication Agreement to be scanned into patient's chart. copy of consent scanned into chart    Electronically signed by: [place], RN 07/14/2020 12:32 PM

## 2020-07-14 NOTE — Anesthesia Preprocedure Evaluation (Addendum)
Anesthesia Evaluation  Patient identified by MRN, date of birth, ID band Patient awake    Reviewed: Allergy & Precautions, NPO status , Patient's Chart, lab work & pertinent test results  History of Anesthesia Complications Negative for: history of anesthetic complications  Airway Mallampati: II  TM Distance: >3 FB Neck ROM: Full    Dental  (+) Edentulous Upper, Missing,    Pulmonary neg pulmonary ROS,    Pulmonary exam normal        Cardiovascular negative cardio ROS Normal cardiovascular exam     Neuro/Psych negative neurological ROS  negative psych ROS   GI/Hepatic negative GI ROS, Neg liver ROS,   Endo/Other  BMI 39  Renal/GU negative Renal ROS  negative genitourinary   Musculoskeletal  (+) Arthritis ,   Abdominal   Peds  Hematology negative hematology ROS (+)   Anesthesia Other Findings Hx of left breast ca  Reproductive/Obstetrics negative OB ROS                            Anesthesia Physical Anesthesia Plan  ASA: 2  Anesthesia Plan: General   Post-op Pain Management:    Induction: Intravenous  PONV Risk Score and Plan: 3 and Treatment may vary due to age or medical condition, Ondansetron, Dexamethasone and Midazolam  Airway Management Planned: Oral ETT  Additional Equipment: None  Intra-op Plan:   Post-operative Plan: Extubation in OR  Informed Consent: I have reviewed the patients History and Physical, chart, labs and discussed the procedure including the risks, benefits and alternatives for the proposed anesthesia with the patient or authorized representative who has indicated his/her understanding and acceptance.     Dental advisory given  Plan Discussed with: CRNA  Anesthesia Plan Comments:        Anesthesia Quick Evaluation

## 2020-07-15 ENCOUNTER — Ambulatory Visit (HOSPITAL_COMMUNITY): Payer: Medicaid Other | Admitting: Anesthesiology

## 2020-07-15 ENCOUNTER — Encounter (HOSPITAL_COMMUNITY): Payer: Self-pay | Admitting: Plastic Surgery

## 2020-07-15 ENCOUNTER — Other Ambulatory Visit: Payer: Self-pay

## 2020-07-15 ENCOUNTER — Encounter (HOSPITAL_COMMUNITY)
Admission: RE | Disposition: A | Discharge status: Home / Self Care | Payer: Self-pay | Source: Home / Self Care | Attending: Plastic Surgery

## 2020-07-15 ENCOUNTER — Ambulatory Visit (HOSPITAL_COMMUNITY)
Admission: RE | Admit: 2020-07-15 | Discharge: 2020-07-15 | Disposition: A | Discharge status: Home / Self Care | Payer: Medicaid Other | Attending: Plastic Surgery | Admitting: Plastic Surgery

## 2020-07-15 ENCOUNTER — Other Ambulatory Visit (HOSPITAL_COMMUNITY): Payer: Self-pay

## 2020-07-15 ENCOUNTER — Other Ambulatory Visit: Payer: Self-pay | Admitting: Plastic Surgery

## 2020-07-15 DIAGNOSIS — Z79899 Other long term (current) drug therapy: Secondary | ICD-10-CM | POA: Insufficient documentation

## 2020-07-15 DIAGNOSIS — M1990 Unspecified osteoarthritis, unspecified site: Secondary | ICD-10-CM | POA: Insufficient documentation

## 2020-07-15 DIAGNOSIS — Z923 Personal history of irradiation: Secondary | ICD-10-CM | POA: Diagnosis not present

## 2020-07-15 DIAGNOSIS — L59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: Secondary | ICD-10-CM | POA: Insufficient documentation

## 2020-07-15 DIAGNOSIS — S21102A Unspecified open wound of left front wall of thorax without penetration into thoracic cavity, initial encounter: Secondary | ICD-10-CM

## 2020-07-15 DIAGNOSIS — I10 Essential (primary) hypertension: Secondary | ICD-10-CM | POA: Diagnosis not present

## 2020-07-15 DIAGNOSIS — Z79811 Long term (current) use of aromatase inhibitors: Secondary | ICD-10-CM | POA: Insufficient documentation

## 2020-07-15 DIAGNOSIS — Z853 Personal history of malignant neoplasm of breast: Secondary | ICD-10-CM | POA: Insufficient documentation

## 2020-07-15 HISTORY — PX: DEBRIDEMENT AND CLOSURE WOUND: SHX5614

## 2020-07-15 HISTORY — PX: BREAST CYST EXCISION: SHX579

## 2020-07-15 SURGERY — DEBRIDEMENT, WOUND, WITH CLOSURE
Anesthesia: General | Site: Breast | Laterality: Right

## 2020-07-15 MED ORDER — PROMETHAZINE HCL 25 MG/ML IJ SOLN: 6.2500 mg | INTRAMUSCULAR | Status: DC | PRN

## 2020-07-15 MED ORDER — DEXMEDETOMIDINE (PRECEDEX) IN NS 20 MCG/5ML (4 MCG/ML) IV SYRINGE
PREFILLED_SYRINGE | INTRAVENOUS | Status: DC | PRN
  Administered 2020-07-15 (×2): 4 ug via INTRAVENOUS

## 2020-07-15 MED ORDER — OXYCODONE HCL 5 MG PO TABS
5.0000 mg | ORAL_TABLET | Freq: Once | ORAL | Status: AC
  Administered 2020-07-15: 5 mg via ORAL

## 2020-07-15 MED ORDER — FENTANYL CITRATE (PF) 250 MCG/5ML IJ SOLN
INTRAMUSCULAR | Status: AC
  Filled 2020-07-15: qty 5

## 2020-07-15 MED ORDER — CHLORHEXIDINE GLUCONATE 0.12 % MT SOLN
OROMUCOSAL | Status: AC
  Administered 2020-07-15: 15 mL via OROMUCOSAL
  Filled 2020-07-15: qty 15

## 2020-07-15 MED ORDER — ORAL CARE MOUTH RINSE: 15.0000 mL | Freq: Once | OROMUCOSAL | Status: AC

## 2020-07-15 MED ORDER — ONDANSETRON HCL 4 MG/2ML IJ SOLN
INTRAMUSCULAR | Status: AC
  Filled 2020-07-15: qty 2

## 2020-07-15 MED ORDER — ONDANSETRON HCL 4 MG/2ML IJ SOLN
INTRAMUSCULAR | Status: DC | PRN
  Administered 2020-07-15: 4 mg via INTRAVENOUS

## 2020-07-15 MED ORDER — ONDANSETRON HCL 4 MG PO TABS
4.0000 mg | ORAL_TABLET | Freq: Three times a day (TID) | ORAL | 0 refills | Status: DC | PRN
  Filled 2020-07-15: qty 5, 2d supply, fill #0

## 2020-07-15 MED ORDER — LIDOCAINE HCL 1 % IJ SOLN
INTRAVENOUS | Status: DC | PRN
  Administered 2020-07-15: 400 mL

## 2020-07-15 MED ORDER — FENTANYL CITRATE (PF) 100 MCG/2ML IJ SOLN
INTRAMUSCULAR | Status: AC
  Filled 2020-07-15: qty 2

## 2020-07-15 MED ORDER — FENTANYL CITRATE (PF) 100 MCG/2ML IJ SOLN
25.0000 ug | INTRAMUSCULAR | Status: DC | PRN
  Administered 2020-07-15: 50 ug via INTRAVENOUS

## 2020-07-15 MED ORDER — LIDOCAINE 2% (20 MG/ML) 5 ML SYRINGE
INTRAMUSCULAR | Status: AC
  Filled 2020-07-15: qty 5

## 2020-07-15 MED ORDER — MIDAZOLAM HCL 5 MG/5ML IJ SOLN
INTRAMUSCULAR | Status: DC | PRN
  Administered 2020-07-15: 2 mg via INTRAVENOUS

## 2020-07-15 MED ORDER — ACETAMINOPHEN 500 MG PO TABS: 1000.0000 mg | ORAL_TABLET | Freq: Once | ORAL | Status: AC

## 2020-07-15 MED ORDER — DEXAMETHASONE SODIUM PHOSPHATE 4 MG/ML IJ SOLN
INTRAMUSCULAR | Status: DC | PRN
  Administered 2020-07-15: 10 mg via INTRAVENOUS

## 2020-07-15 MED ORDER — ACETAMINOPHEN 500 MG PO TABS
ORAL_TABLET | ORAL | Status: AC
  Administered 2020-07-15: 1000 mg via ORAL
  Filled 2020-07-15: qty 2

## 2020-07-15 MED ORDER — DEXAMETHASONE SODIUM PHOSPHATE 10 MG/ML IJ SOLN
INTRAMUSCULAR | Status: AC
  Filled 2020-07-15: qty 1

## 2020-07-15 MED ORDER — LIDOCAINE 2% (20 MG/ML) 5 ML SYRINGE
INTRAMUSCULAR | Status: DC | PRN
  Administered 2020-07-15: 100 mg via INTRAVENOUS

## 2020-07-15 MED ORDER — LACTATED RINGERS IV SOLN: INTRAVENOUS | Status: DC

## 2020-07-15 MED ORDER — 0.9 % SODIUM CHLORIDE (POUR BTL) OPTIME
TOPICAL | Status: DC | PRN
  Administered 2020-07-15: 1000 mL

## 2020-07-15 MED ORDER — PROMETHAZINE HCL 25 MG/ML IJ SOLN
INTRAMUSCULAR | Status: AC
  Filled 2020-07-15: qty 1

## 2020-07-15 MED ORDER — CEFAZOLIN SODIUM-DEXTROSE 2-4 GM/100ML-% IV SOLN
2.0000 g | INTRAVENOUS | Status: AC
  Administered 2020-07-15: 2 g via INTRAVENOUS

## 2020-07-15 MED ORDER — OXYCODONE HCL 5 MG PO TABS
ORAL_TABLET | ORAL | Status: AC
  Filled 2020-07-15: qty 1

## 2020-07-15 MED ORDER — PROPOFOL 10 MG/ML IV BOLUS
INTRAVENOUS | Status: DC | PRN
  Administered 2020-07-15: 200 mg via INTRAVENOUS

## 2020-07-15 MED ORDER — ROCURONIUM BROMIDE 10 MG/ML (PF) SYRINGE
PREFILLED_SYRINGE | INTRAVENOUS | Status: AC
  Filled 2020-07-15: qty 10

## 2020-07-15 MED ORDER — PROPOFOL 10 MG/ML IV BOLUS
INTRAVENOUS | Status: AC
  Filled 2020-07-15: qty 20

## 2020-07-15 MED ORDER — MIDAZOLAM HCL 2 MG/2ML IJ SOLN
INTRAMUSCULAR | Status: AC
  Filled 2020-07-15: qty 2

## 2020-07-15 MED ORDER — CHLORHEXIDINE GLUCONATE 0.12 % MT SOLN: 15.0000 mL | Freq: Once | OROMUCOSAL | Status: AC

## 2020-07-15 MED ORDER — FENTANYL CITRATE (PF) 100 MCG/2ML IJ SOLN
INTRAMUSCULAR | Status: DC | PRN
  Administered 2020-07-15: 25 ug via INTRAVENOUS
  Administered 2020-07-15 (×2): 50 ug via INTRAVENOUS
  Administered 2020-07-15: 25 ug via INTRAVENOUS
  Administered 2020-07-15: 50 ug via INTRAVENOUS
  Administered 2020-07-15 (×2): 25 ug via INTRAVENOUS

## 2020-07-15 MED ORDER — HYDROCODONE-ACETAMINOPHEN 5-325 MG PO TABS
1.0000 | ORAL_TABLET | ORAL | 0 refills | Status: DC | PRN
  Filled 2020-07-15: qty 30, 5d supply, fill #0

## 2020-07-15 MED ORDER — CEFAZOLIN SODIUM-DEXTROSE 2-4 GM/100ML-% IV SOLN
INTRAVENOUS | Status: AC
  Filled 2020-07-15: qty 100

## 2020-07-15 MED ORDER — OXYCODONE HCL 5 MG PO TABS
5.0000 mg | ORAL_TABLET | Freq: Once | ORAL | Status: AC | PRN
  Administered 2020-07-15: 5 mg via ORAL

## 2020-07-15 MED ORDER — OXYCODONE HCL 5 MG/5ML PO SOLN: 5.0000 mg | Freq: Once | ORAL | Status: AC | PRN

## 2020-07-15 SURGICAL SUPPLY — 80 items
APL PRP STRL LF DISP 70% ISPRP (MISCELLANEOUS) ×4
APL SKNCLS STERI-STRIP NONHPOA (GAUZE/BANDAGES/DRESSINGS) ×2
BAG COUNTER SPONGE SURGICOUNT (BAG) ×3 IMPLANT
BAG DECANTER FOR FLEXI CONT (MISCELLANEOUS) ×3 IMPLANT
BAG SPNG CNTER NS LX DISP (BAG) ×2
BENZOIN TINCTURE PRP APPL 2/3 (GAUZE/BANDAGES/DRESSINGS) ×5 IMPLANT
BLADE SURG 10 STRL SS (BLADE) ×3 IMPLANT
BLADE SURG 15 STRL LF DISP TIS (BLADE) ×2 IMPLANT
BLADE SURG 15 STRL SS (BLADE) ×3
BNDG CMPR MED 15X6 ELC VLCR LF (GAUZE/BANDAGES/DRESSINGS) ×2
BNDG ELASTIC 6X15 VLCR STRL LF (GAUZE/BANDAGES/DRESSINGS) ×1 IMPLANT
BNDG GAUZE ELAST 4 BULKY (GAUZE/BANDAGES/DRESSINGS) IMPLANT
CANISTER SUCT 1200ML W/VALVE (MISCELLANEOUS) ×3 IMPLANT
CANISTER WOUND CARE 500ML ATS (WOUND CARE) IMPLANT
CHLORAPREP W/TINT 26 (MISCELLANEOUS) ×6 IMPLANT
CLIP VESOCCLUDE MED 6/CT (CLIP) IMPLANT
COVER SURGICAL LIGHT HANDLE (MISCELLANEOUS) ×3 IMPLANT
DECANTER SPIKE VIAL GLASS SM (MISCELLANEOUS) IMPLANT
DRAIN CHANNEL 15F RND FF W/TCR (WOUND CARE) ×1 IMPLANT
DRAPE INCISE IOBAN 66X45 STRL (DRAPES) IMPLANT
DRAPE LAPAROSCOPIC ABDOMINAL (DRAPES) ×2 IMPLANT
DRAPE LAPAROTOMY T 98X78 PEDS (DRAPES) IMPLANT
DRAPE ORTHO SPLIT 77X108 STRL (DRAPES) ×6
DRAPE SURG ORHT 6 SPLT 77X108 (DRAPES) IMPLANT
DRAPE UTILITY XL STRL (DRAPES) ×2 IMPLANT
DRSG EMULSION OIL 3X3 NADH (GAUZE/BANDAGES/DRESSINGS) IMPLANT
DRSG PAD ABDOMINAL 8X10 ST (GAUZE/BANDAGES/DRESSINGS) ×6 IMPLANT
DRSG TELFA 3X8 NADH (GAUZE/BANDAGES/DRESSINGS) IMPLANT
DRSG VAC ATS LRG SENSATRAC (GAUZE/BANDAGES/DRESSINGS) IMPLANT
DRSG VAC ATS MED SENSATRAC (GAUZE/BANDAGES/DRESSINGS) IMPLANT
ELECT CAUTERY BLADE 6.4 (BLADE) ×3 IMPLANT
ELECT REM PT RETURN 9FT ADLT (ELECTROSURGICAL) ×3
ELECTRODE REM PT RTRN 9FT ADLT (ELECTROSURGICAL) ×2 IMPLANT
EVACUATOR SILICONE 100CC (DRAIN) ×1 IMPLANT
GAUZE SPONGE 4X4 12PLY STRL (GAUZE/BANDAGES/DRESSINGS) ×5 IMPLANT
GAUZE XEROFORM 5X9 LF (GAUZE/BANDAGES/DRESSINGS) ×3 IMPLANT
GLOVE SRG 8 PF TXTR STRL LF DI (GLOVE) ×2 IMPLANT
GLOVE SURG ENC MOIS LTX SZ6.5 (GLOVE) IMPLANT
GLOVE SURG ENC MOIS LTX SZ7.5 (GLOVE) IMPLANT
GLOVE SURG ENC TEXT LTX SZ7.5 (GLOVE) ×6 IMPLANT
GLOVE SURG LTX SZ6.5 (GLOVE) IMPLANT
GLOVE SURG UNDER POLY LF SZ8 (GLOVE) ×3
GOWN STRL REUS W/ TWL LRG LVL3 (GOWN DISPOSABLE) ×4 IMPLANT
GOWN STRL REUS W/TWL LRG LVL3 (GOWN DISPOSABLE) ×6
KIT BASIN OR (CUSTOM PROCEDURE TRAY) ×3 IMPLANT
KIT TURNOVER KIT A (KITS) ×3 IMPLANT
MARKER SKIN DUAL TIP RULER LAB (MISCELLANEOUS) IMPLANT
NDL SAFETY ECLIPSE 18X1.5 (NEEDLE) IMPLANT
NDL SPNL 18GX3.5 QUINCKE PK (NEEDLE) ×2 IMPLANT
NEEDLE HYPO 18GX1.5 SHARP (NEEDLE)
NEEDLE SPNL 18GX3.5 QUINCKE PK (NEEDLE) ×3 IMPLANT
NS IRRIG 1000ML POUR BTL (IV SOLUTION) ×3 IMPLANT
PACK GENERAL/GYN (CUSTOM PROCEDURE TRAY) ×3 IMPLANT
PAD DRESSING TELFA 3X8 NADH (GAUZE/BANDAGES/DRESSINGS) IMPLANT
PENCIL SMOKE EVACUATOR (MISCELLANEOUS) ×3 IMPLANT
PIN SAFETY STERILE (MISCELLANEOUS) IMPLANT
SHEET MEDIUM DRAPE 40X70 STRL (DRAPES) IMPLANT
SLEEVE SCD COMPRESS KNEE MED (STOCKING) IMPLANT
SOLUTION BETADINE 4OZ (MISCELLANEOUS) ×3 IMPLANT
SPONGE T-LAP 18X18 ~~LOC~~+RFID (SPONGE) ×4 IMPLANT
STAPLER INSORB 30 2030 C-SECTI (MISCELLANEOUS) ×3 IMPLANT
STAPLER VISISTAT 35W (STAPLE) ×3 IMPLANT
STRIP CLOSURE SKIN 1/2X4 (GAUZE/BANDAGES/DRESSINGS) ×9 IMPLANT
SUT CHROMIC 4 0 PS 2 18 (SUTURE) ×6 IMPLANT
SUT ETHILON 2 0 FS 18 (SUTURE) IMPLANT
SUT ETHILON 3 0 PS 1 (SUTURE) ×6 IMPLANT
SUT MNCRL AB 4-0 PS2 18 (SUTURE) IMPLANT
SUT PDS AB 2-0 CT2 27 (SUTURE) IMPLANT
SUT PDS AB 3-0 SH 27 (SUTURE) ×2 IMPLANT
SUT VIC AB 3-0 PS1 18 (SUTURE) ×6
SUT VIC AB 3-0 PS1 18XBRD (SUTURE) ×4 IMPLANT
SUT VLOC 90 P-14 23 (SUTURE) ×5 IMPLANT
SWAB COLLECTION DEVICE MRSA (MISCELLANEOUS) IMPLANT
SYR 50ML LL SCALE MARK (SYRINGE) ×6 IMPLANT
SYR CONTROL 10ML LL (SYRINGE) ×3 IMPLANT
TAPE MEASURE VINYL STERILE (MISCELLANEOUS) IMPLANT
TOWEL GREEN STERILE (TOWEL DISPOSABLE) ×3 IMPLANT
TOWEL GREEN STERILE FF (TOWEL DISPOSABLE) ×6 IMPLANT
TRAY FOLEY W/BAG SLVR 14FR LF (SET/KITS/TRAYS/PACK) IMPLANT
UNDERPAD 30X36 HEAVY ABSORB (UNDERPADS AND DIAPERS) ×6 IMPLANT

## 2020-07-15 NOTE — Brief Op Note (Signed)
07/15/2020  12:55 PM  PATIENT:  [name]  50 y.o. female  PRE-OPERATIVE DIAGNOSIS:  open chest wound  POST-OPERATIVE DIAGNOSIS:  open chest wound  PROCEDURE:  Procedure(s) with comments: Debridement of left chest wound and rotational flap closure (Left) - 1.5 hour Excision excess right axillary tissue (Right)  SURGEON:  Surgeon(s) and Role:    * Ariyel Jeangilles, Steffanie Dunn, MD - Primary  PHYSICIAN ASSISTANT: Ewell Poe, RN  ASSISTANTS: none   ANESTHESIA:   general  EBL:  25   BLOOD ADMINISTERED:none  DRAINS: (1) Jackson-Pratt drain(s) with closed bulb suction in the left chest    LOCAL MEDICATIONS USED:  MARCAINE     SPECIMEN:  Source of Specimen:  left chest wound  DISPOSITION OF SPECIMEN:  PATHOLOGY  COUNTS:  YES  TOURNIQUET:  * No tourniquets in log *  DICTATION: .Dragon Dictation  PLAN OF CARE: Discharge to home after PACU  PATIENT DISPOSITION:  PACU - hemodynamically stable.   Delay start of Pharmacological VTE agent (>24hrs) due to surgical blood loss or risk of bleeding: not applicable

## 2020-07-15 NOTE — Anesthesia Procedure Notes (Signed)
Procedure Name: LMA Insertion Date/Time: 07/15/2020 11:21 AM Performed by: Annamary Carolin, CRNA Pre-anesthesia Checklist: Patient identified, Emergency Drugs available, Suction available and Patient being monitored Patient Re-evaluated:Patient Re-evaluated prior to induction Oxygen Delivery Method: Circle System Utilized Preoxygenation: Pre-oxygenation with 100% oxygen Induction Type: IV induction Ventilation: Mask ventilation without difficulty LMA: LMA inserted LMA Size: 4.0 Number of attempts: 1 Placement Confirmation: positive ETCO2 and breath sounds checked- equal and bilateral Tube secured with: Tape Dental Injury: Teeth and Oropharynx as per pre-operative assessment

## 2020-07-15 NOTE — Op Note (Signed)
Operative Note   DATE OF OPERATION: 07/15/2020  SURGICAL DEPARTMENT: Plastic Surgery  PREOPERATIVE DIAGNOSES: 1.  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  Debridement left chest wound totaling 6 x 6 cm including skin and subcutaneous tissue 2.  Reconstruction of left chest wound with adjacent tissue transfer totaling 18 x 15 cm including the primary and secondary defect 3.  Excision of right axillary soft tissue totaling 16 x 5 cm with complex closure  SURGEON: Talmadge Coventry, MD  ASSISTANT: Ewell Poe, RN  ANESTHESIA:  General.   COMPLICATIONS: None.   INDICATIONS FOR PROCEDURE:  The patient, [name] is a 50 y.o. female born on [birth year], is here for treatment of chronic left chest wound and right axillary soft tissue excess MRN: [id number]  CONSENT:  Informed consent was obtained directly from the patient. Risks, benefits and alternatives were fully discussed. Specific risks including but not limited to bleeding, infection, hematoma, seroma, scarring, pain, contracture, asymmetry, wound healing problems, and need for further surgery were all discussed. The patient did have an ample opportunity to have questions answered to satisfaction.   DESCRIPTION OF PROCEDURE:  The patient was taken to the operating room. SCDs were placed and antibiotics were given.  General anesthesia was administered.  The patient's operative site was prepped and draped in a sterile fashion. A time out was performed and all information was confirmed to be correct.  The left chest wound measured approximately 5 x 5 cm.  I plan to take an extra centimeter around the edge for thorough debridement.  This went down to but did not invade the pectoralis muscle.  I then planned out a rotational flap from the abdomen.  The areas infiltrated with tumescent solution.  I then sharply  debrided the chest wound excising a centimeter of skin around it including the floor of the chronic wound and sent this to pathology.  Hemostasis was obtained.  Limited undermining was performed around the edges to free up the skin which was very firm and fibrotic.  I then incised along the planned rotational flap incision with a 15 blade and dissected down to level of fascia.  The flap was then undermined and advanced to the point where it could closed under minimal tension.  44 French drain was placed from laterally and secured with a nylon suture.  The flap was then inset with interrupted mattress 3-0 PDS sutures.  The remainder the incision was then closed with interrupted buried Enzor staples and a running 3 OV lock.  The area of debridement totaled 6 x 6 cm and the area for adjacent tissue transfer was 18 x 15 cm. I then turned my attention to the right side.  She had excess skin laterally after her mastectomy.  I had planned out the excision with her in the standing position preoperatively.  The area was infiltrated with tumescent solution.  The excision was performed with a 15 blade and cautery including skin and subcutaneous tissue.  Surrounding skin was then undermined and advanced and closed in layered in layers with an sorb stapler and a running 3 OV lock.  Length of closure totaled 16 cm.  Steri-Strips and benzoin were applied to the wounds followed by soft compressive dressing.  The patient tolerated the procedure well.  There were no complications. The patient was allowed to wake from anesthesia, extubated and taken to the recovery room  in satisfactory condition.

## 2020-07-15 NOTE — Interval H&P Note (Signed)
History and Physical Interval Note:  07/15/2020 10:25 AM  [name]  has presented today for surgery, with the diagnosis of open chest wound.  The various methods of treatment have been discussed with the patient and family. After consideration of risks, benefits and other options for treatment, the patient has consented to  Procedure(s) with comments: Debridement of left chest wound and rotational flap closure (Left) - 1.5 hour Excision excess right axillary tissue (Right) as a surgical intervention.  The patient's history has been reviewed, patient examined, no change in status, stable for surgery.  I have reviewed the patient's chart and labs.  Questions were answered to the patient's satisfaction.     Cindra Presume

## 2020-07-15 NOTE — Discharge Instructions (Signed)
Activity: As tolerated, but avoid strenuous activity until follow up visit.  Diet: Regular  Wound Care: Keep dressing clean & dry for 2 days.  After that you can shower normally.  Redress the wound as needed for comfort.  Would recommend some form of compression through your next follow up visit.  This can be an ACE wrap or other compression garment.  Empty and record drain output.  Special Instructions:  Call our office if any unusual problems occur such as pain, excessive bleeding, unrelieved nausea/vomiting, fever &/or chills.  Follow-up appointment: Scheduled for next week.

## 2020-07-15 NOTE — Anesthesia Postprocedure Evaluation (Signed)
Anesthesia Post Note  Patient: [name] [name] Shank  Procedure(s) Performed: Debridement of left chest wound and rotational flap closure (Left: Breast) Excision excess right axillary tissue (Right: Axilla)     Patient location during evaluation: PACU Anesthesia Type: General Level of consciousness: awake and alert and oriented Pain management: pain level controlled Vital Signs Assessment: post-procedure vital signs reviewed and stable Respiratory status: spontaneous breathing, nonlabored ventilation and respiratory function stable Cardiovascular status: blood pressure returned to baseline Postop Assessment: no apparent nausea or vomiting Anesthetic complications: no   No notable events documented.  Last Vitals:  Vitals:   07/15/20 0836 07/15/20 1316  BP: 128/81 137/73  Pulse: 89   Resp: 18 14  Temp: 36.9 C 36.7 C  SpO2: 97% 98%    Last Pain:  Vitals:   07/15/20 1316  TempSrc:   PainSc: [place]

## 2020-07-15 NOTE — Transfer of Care (Signed)
Immediate Anesthesia Transfer of Care Note  Patient: [name]  Procedure(s) Performed: Debridement of left chest wound and rotational flap closure (Left: Breast) Excision excess right axillary tissue (Right: Axilla)  Patient Location: PACU  Anesthesia Type:General  Level of Consciousness: sedated, patient cooperative and responds to stimulation  Airway & Oxygen Therapy: Patient Spontanous Breathing and Patient connected to face mask oxygen  Post-op Assessment: Report given to RN and Post -op Vital signs reviewed and stable  Post vital signs: Reviewed and stable  Last Vitals:  Vitals Value Taken Time  BP 137/73 07/15/20 1313  Temp    Pulse 89 07/15/20 1316  Resp 13 07/15/20 1316  SpO2 97 % 07/15/20 1316  Vitals shown include unvalidated device data.  Last Pain:  Vitals:   07/15/20 0856  TempSrc:   PainSc: 4       Patients Stated Pain Goal: 4 (64/40/34 7425)  Complications: No notable events documented.

## 2020-07-16 ENCOUNTER — Other Ambulatory Visit (HOSPITAL_COMMUNITY): Payer: Self-pay

## 2020-07-16 ENCOUNTER — Encounter (HOSPITAL_COMMUNITY): Payer: Self-pay | Admitting: Plastic Surgery

## 2020-07-16 LAB — SURGICAL PATHOLOGY

## 2020-07-17 ENCOUNTER — Other Ambulatory Visit (HOSPITAL_COMMUNITY): Payer: Self-pay

## 2020-07-18 ENCOUNTER — Other Ambulatory Visit (HOSPITAL_COMMUNITY): Payer: Self-pay

## 2020-07-19 ENCOUNTER — Other Ambulatory Visit (HOSPITAL_COMMUNITY): Payer: Self-pay

## 2020-07-19 MED ORDER — LORAZEPAM 1 MG PO TABS
1.0000 mg | ORAL_TABLET | ORAL | 1 refills | Status: DC
  Filled 2020-07-19: qty 3, 1d supply, fill #0

## 2020-07-22 ENCOUNTER — Encounter (HOSPITAL_BASED_OUTPATIENT_CLINIC_OR_DEPARTMENT_OTHER): Payer: Medicaid Other | Admitting: Internal Medicine

## 2020-07-23 ENCOUNTER — Encounter (HOSPITAL_BASED_OUTPATIENT_CLINIC_OR_DEPARTMENT_OTHER): Payer: Medicaid Other | Admitting: Internal Medicine

## 2020-07-23 ENCOUNTER — Encounter (HOSPITAL_BASED_OUTPATIENT_CLINIC_OR_DEPARTMENT_OTHER): Payer: Medicaid Other | Attending: Internal Medicine | Admitting: Internal Medicine

## 2020-07-23 DIAGNOSIS — X58XXXD Exposure to other specified factors, subsequent encounter: Secondary | ICD-10-CM | POA: Insufficient documentation

## 2020-07-23 DIAGNOSIS — D0512 Intraductal carcinoma in situ of left breast: Secondary | ICD-10-CM | POA: Insufficient documentation

## 2020-07-23 DIAGNOSIS — L59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: Secondary | ICD-10-CM | POA: Insufficient documentation

## 2020-07-23 DIAGNOSIS — Z9013 Acquired absence of bilateral breasts and nipples: Secondary | ICD-10-CM | POA: Insufficient documentation

## 2020-07-23 DIAGNOSIS — Z888 Allergy status to other drugs, medicaments and biological substances status: Secondary | ICD-10-CM | POA: Insufficient documentation

## 2020-07-23 DIAGNOSIS — Z171 Estrogen receptor negative status [ER-]: Secondary | ICD-10-CM | POA: Insufficient documentation

## 2020-07-23 DIAGNOSIS — T8131XD Disruption of external operation (surgical) wound, not elsewhere classified, subsequent encounter: Secondary | ICD-10-CM | POA: Insufficient documentation

## 2020-07-24 ENCOUNTER — Other Ambulatory Visit: Payer: Self-pay

## 2020-07-24 ENCOUNTER — Encounter (HOSPITAL_BASED_OUTPATIENT_CLINIC_OR_DEPARTMENT_OTHER): Payer: Medicaid Other | Admitting: Physician Assistant

## 2020-07-24 ENCOUNTER — Ambulatory Visit (INDEPENDENT_AMBULATORY_CARE_PROVIDER_SITE_OTHER): Payer: Medicaid Other | Admitting: Plastic Surgery

## 2020-07-24 DIAGNOSIS — S21102A Unspecified open wound of left front wall of thorax without penetration into thoracic cavity, initial encounter: Secondary | ICD-10-CM

## 2020-07-24 NOTE — Progress Notes (Signed)
Patient presents postop from flap closure of the left chest wound in the setting of radiation.  Additionally I removed the dogear from the right side from her prior mastectomy.  She feels like things are going well.  She did notice some odor coming from the incision as it approaches her axilla.  On exam the majority of the incision is intact and healing well.  There is about a 2 cm area in the area of her concern where there is been a small dehiscence.  I suspect this will result in around a 2 cm opening that will hopefully heal with time.  The drain is putting out very little and was removed today.  I recommended conservative wound care for the time being.  She is scheduled to do hyperbaric oxygen treatments which may be of some help in this setting.  I will plan to see her again in 2 weeks.  All of her questions were answered.

## 2020-07-25 ENCOUNTER — Other Ambulatory Visit (HOSPITAL_COMMUNITY)
Admission: RE | Admit: 2020-07-25 | Discharge: 2020-07-25 | Disposition: A | Discharge status: Home / Self Care | Payer: Medicaid Other | Attending: Internal Medicine | Admitting: Internal Medicine

## 2020-07-25 ENCOUNTER — Encounter (HOSPITAL_BASED_OUTPATIENT_CLINIC_OR_DEPARTMENT_OTHER): Payer: Medicaid Other | Admitting: Internal Medicine

## 2020-07-25 ENCOUNTER — Other Ambulatory Visit: Payer: Self-pay | Admitting: Oncology

## 2020-07-25 ENCOUNTER — Other Ambulatory Visit (HOSPITAL_COMMUNITY): Payer: Self-pay

## 2020-07-25 DIAGNOSIS — Z888 Allergy status to other drugs, medicaments and biological substances status: Secondary | ICD-10-CM | POA: Diagnosis not present

## 2020-07-25 DIAGNOSIS — L59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: Secondary | ICD-10-CM | POA: Diagnosis not present

## 2020-07-25 DIAGNOSIS — T8131XD Disruption of external operation (surgical) wound, not elsewhere classified, subsequent encounter: Secondary | ICD-10-CM | POA: Insufficient documentation

## 2020-07-25 DIAGNOSIS — X58XXXD Exposure to other specified factors, subsequent encounter: Secondary | ICD-10-CM | POA: Diagnosis not present

## 2020-07-25 DIAGNOSIS — Z171 Estrogen receptor negative status [ER-]: Secondary | ICD-10-CM | POA: Diagnosis not present

## 2020-07-25 DIAGNOSIS — D0512 Intraductal carcinoma in situ of left breast: Secondary | ICD-10-CM | POA: Diagnosis not present

## 2020-07-25 DIAGNOSIS — Z9013 Acquired absence of bilateral breasts and nipples: Secondary | ICD-10-CM | POA: Diagnosis not present

## 2020-07-25 MED ORDER — LORAZEPAM 1 MG PO TABS
1.0000 mg | ORAL_TABLET | ORAL | 1 refills | Status: DC
  Filled 2020-07-25: qty 30, 30d supply, fill #0
  Filled 2020-09-07: qty 30, 30d supply, fill #1

## 2020-07-25 MED ORDER — AMOXICILLIN-POT CLAVULANATE 875-125 MG PO TABS
ORAL_TABLET | ORAL | 0 refills | Status: DC
  Filled 2020-07-25: qty 14, 7d supply, fill #0

## 2020-07-25 NOTE — Progress Notes (Signed)
[name], [name] ([id number]) Visit Report for 07/25/2020 Arrival Information Details Patient Name: Date of Service: [name] 07/25/2020 8:45 A M Medical Record Number: [id number] Patient Account Number: [id number] Date of Birth/Sex: Treating RN: May 28, 1970 (50 y.o. [name], [name] Primary Care : [name] Other Clinician: Referring : Treating /Extender: [name] in Treatment: 15 Visit Information History Since Last Visit Added or deleted any medications: No Patient Arrived: Ambulatory Any new allergies or adverse reactions: No Arrival Time: 10:00 Had a fall or experienced change in No Accompanied By: self activities of daily living that may affect Transfer Assistance: None risk of falls: Patient Identification Verified: Yes Signs or symptoms of abuse/neglect since last visito No Secondary Verification Process Completed: Yes Hospitalized since last visit: No Patient Requires Transmission-Based Precautions: No Implantable device outside of the clinic excluding No Patient Has Alerts: No cellular tissue based products placed in the center since last visit: Has Dressing in Place as Prescribed: Yes Pain Present Now: No Electronic Signature(s) Signed: 07/25/2020 6:31:47 PM By: Deon Pilling Entered By: Deon Pilling on 07/25/2020 13:01:43 -------------------------------------------------------------------------------- Clinic Level of Care Assessment Details Patient Name: Date of Service: [name] 07/25/2020 8:45 A M Medical Record Number: [id number] Patient Account Number: [id number] Date of Birth/Sex: Treating RN: [birth year] (50 y.o. [name], [name] Primary Care : [name] Other Clinician: Referring : Treating /Extender: [name] in Treatment: 15 Clinic Level of Care Assessment Items TOOL 4 Quantity Score X- 1 0 Use when  only an EandM is performed on FOLLOW-UP visit ASSESSMENTS - Nursing Assessment / Reassessment X- 1 10 Reassessment of Co-morbidities (includes updates in patient status) X- 1 5 Reassessment of Adherence to Treatment Plan ASSESSMENTS - Wound and Skin A ssessment / Reassessment X - Simple Wound Assessment / Reassessment - one wound 1 5 [] - 0 Complex Wound Assessment / Reassessment - multiple wounds X- 1 10 Dermatologic / Skin Assessment (not related to wound area) ASSESSMENTS - Focused Assessment [] - 0 Circumferential Edema Measurements - multi extremities X- 1 10 Nutritional Assessment / Counseling / Intervention [] - 0 Lower Extremity Assessment (monofilament, tuning fork, pulses) [] - 0 Peripheral Arterial Disease Assessment (using hand held doppler) ASSESSMENTS - Ostomy and/or Continence Assessment and Care [] - 0 Incontinence Assessment and Management [] - 0 Ostomy Care Assessment and Management (repouching, etc.) PROCESS - Coordination of Care X - Simple Patient / Family Education for ongoing care 1 15 [] - 0 Complex (extensive) Patient / Family Education for ongoing care X- 1 10 Staff obtains Programmer, systems, Records, T Results / Process Orders est [] - 0 Staff telephones HHA, Nursing Homes / Clarify orders / etc [] - 0 Routine Transfer to another Facility (non-emergent condition) [] - 0 Routine Hospital Admission (non-emergent condition) [] - 0 New Admissions / Biomedical engineer / Ordering NPWT Apligraf, etc. , [] - 0 Emergency Hospital Admission (emergent condition) [] - 0 Simple Discharge Coordination [] - 0 Complex (extensive) Discharge Coordination PROCESS - Special Needs [] - 0 Pediatric / Minor Patient Management [] - 0 Isolation Patient Management [] - 0 Hearing / Language / Visual special needs [] - 0 Assessment of Community assistance (transportation, D/C planning, etc.) [] - 0 Additional assistance / Altered mentation [] - 0 Support  Surface(s) Assessment (bed, cushion, seat, etc.) INTERVENTIONS - Wound Cleansing / Measurement X - Simple Wound Cleansing - one wound 1 5 [] -  0 Complex Wound Cleansing - multiple wounds X- 1 5 Wound Imaging (photographs - any number of wounds) [] - 0 Wound Tracing (instead of photographs) X- 1 5 Simple Wound Measurement - one wound [] - 0 Complex Wound Measurement - multiple wounds INTERVENTIONS - Wound Dressings [] - 0 Small Wound Dressing one or multiple wounds X- 1 15 Medium Wound Dressing one or multiple wounds [] - 0 Large Wound Dressing one or multiple wounds [] - 0 Application of Medications - topical [] - 0 Application of Medications - injection INTERVENTIONS - Miscellaneous [] - 0 External ear exam X- 1 5 Specimen Collection (cultures, biopsies, blood, body fluids, etc.) [] - 0 Specimen(s) / Culture(s) sent or taken to Lab for analysis [] - 0 Patient Transfer (multiple staff / Civil Service fast streamer / Similar devices) [] - 0 Simple Staple / Suture removal (25 or less) [] - 0 Complex Staple / Suture removal (26 or more) [] - 0 Hypo / Hyperglycemic Management (close monitor of Blood Glucose) [] - 0 Ankle / Brachial Index (ABI) - do not check if billed separately X- 1 5 Vital Signs Has the patient been seen at the hospital within the last three years: Yes Total Score: 105 Level Of Care: New/Established - Level 3 Electronic Signature(s) Signed: 07/25/2020 6:31:47 PM By: Deon Pilling Entered By: Deon Pilling on 07/25/2020 13:04:47 -------------------------------------------------------------------------------- Encounter Discharge Information Details Patient Name: Date of Service: [name] [name] 07/25/2020 8:45 A M Medical Record Number: [id number] Patient Account Number: [id number] Date of Birth/Sex: Treating RN: 1970-09-19 (50 y.o. [name]: [name] Other Clinician: Referring Lunabelle Oatley: Treating Willer Osorno/Extender:  [name] in Treatment: 15 Encounter Discharge Information Items Discharge Condition: Stable Ambulatory Status: Ambulatory Discharge Destination: Home Transportation: Private Auto Accompanied By: daughter Schedule Follow-up Appointment: Yes Clinical Summary of Care: Electronic Signature(s) Signed: 07/25/2020 6:31:47 PM By: Deon Pilling Entered By: Deon Pilling on 07/25/2020 13:05:16 -------------------------------------------------------------------------------- Lower Extremity Assessment Details Patient Name: Date of Service: [name] 07/25/2020 8:45 A M Medical Record Number: [id number] Patient Account Number: [id number] Date of Birth/Sex: Treating RN: 07-25-1970 (50 y.o. [name] Primary Care [name]: [name] Other Clinician: Referring Erique Kaser: Treating Penni Penado/Extender: [name] in Treatment: 15 Electronic Signature(s) Signed: 07/25/2020 6:31:47 PM By: Deon Pilling Entered By: Deon Pilling on 07/25/2020 13:02:15 -------------------------------------------------------------------------------- Multi-Disciplinary Care Plan Details Patient Name: Date of Service: [name] [name] 07/25/2020 8:45 A M Medical Record Number: [id number] Patient Account Number: [id number] Date of Birth/Sex: Treating RN: 31-Mar-1970 (50 y.o. [name], [name] Primary Care Mahari Vankirk: [name] Other Clinician: Referring Averyana Pillars: Treating Leida Luton/Extender: [name] in Treatment: 15 Active Inactive Wound/Skin Impairment Nursing Diagnoses: Impaired tissue integrity Knowledge deficit related to smoking impact on wound healing Knowledge deficit related to ulceration/compromised skin integrity Goals: Patient will demonstrate a reduced rate of smoking or cessation of smoking Date Initiated: 04/09/2020 Date Inactivated: 06/18/2020 Target Resolution Date:  06/28/2020 Goal Status: Met Patient will have a decrease in wound volume by X% from date: (specify in notes) Date Initiated: 04/09/2020 Date Inactivated: 07/03/2020 Target Resolution Date: 07/05/2020 Goal Status: Met Patient/caregiver will verbalize understanding of skin care regimen Date Initiated: 04/09/2020 Target Resolution Date: 08/02/2020 Goal Status: Active Ulcer/skin breakdown will have a volume reduction of 30% by week 4 Date Initiated: 04/09/2020 Date Inactivated: 05/14/2020 Target Resolution Date: 05/11/2020 Unmet Reason: see wound Goal Status:  Unmet meaurements. Interventions: Assess patient/caregiver ability to obtain necessary supplies Assess patient/caregiver ability to perform ulcer/skin care regimen upon admission and as needed Assess ulceration(s) every visit Provide education on smoking Notes: Electronic Signature(s) Signed: 07/25/2020 6:31:47 PM By: Deon Pilling Entered By: Deon Pilling on 07/25/2020 13:03:57 -------------------------------------------------------------------------------- Pain Assessment Details Patient Name: Date of Service: [name] [name] 07/25/2020 8:45 A M Medical Record Number: [id number] Patient Account Number: [id number] Date of Birth/Sex: Treating RN: 02/16/1970 (50 y.o. [name] Primary Care Kynnedi Zweig: [name] Other Clinician: Referring Rudie Sermons: Treating Trenyce Loera/Extender: [name] in Treatment: 15 Active Problems Location of Pain Severity and Description of Pain Patient Has Paino No Site Locations Rate the pain. Rate the pain. Current Pain Level: 0 Pain Management and Medication Current Pain Management: Medication: No Cold Application: No Rest: No Massage: No Activity: No T.E.N.S.: No Heat Application: No Leg drop or elevation: No Is the Current Pain Management Adequate: Adequate How does your wound impact your activities of daily livingo Sleep: No Bathing:  No Appetite: No Relationship With Others: No Bladder Continence: No Emotions: No Bowel Continence: No Work: No Toileting: No Drive: No Dressing: No Hobbies: No Electronic Signature(s) Signed: 07/25/2020 6:31:47 PM By: Deon Pilling Entered By: Deon Pilling on 07/25/2020 13:02:05 -------------------------------------------------------------------------------- Patient/Caregiver Education Details Patient Name: Date of Service: [name] [name] 7/21/2022andnbsp8:45 A M Medical Record Number: [id number] Patient Account Number: [id number] Date of Birth/Gender: Treating RN: [birth year] (50 y.o. [name] Primary Care Physician: [name] Other Clinician: Referring Physician: Treating Physician/Extender: [name] in Treatment: 15 Education Assessment Education Provided To: Patient Education Topics Provided Infection: Handouts: Infection Prevention and Management Methods: Explain/Verbal Responses: Reinforcements needed Electronic Signature(s) Signed: 07/25/2020 6:31:47 PM By: Deon Pilling Entered By: Deon Pilling on 07/25/2020 13:04:12 -------------------------------------------------------------------------------- Wound Assessment Details Patient Name: Date of Service: [name] [name] 07/25/2020 8:45 A M Medical Record Number: [id number] Patient Account Number: [id number] Date of Birth/Sex: Treating RN: Feb 14, 1970 (50 y.o. [name], [name] Primary Care [name]: [name] Other Clinician: Referring Montrae Braithwaite: Treating Manjot Hinks/Extender: [name] in Treatment: 15 Wound Status Wound Number: 1 Primary Open Surgical Wound Etiology: Wound Location: Left Chest Secondary Necrosis (Radiation) Wounding Event: Surgical Injury Etiology: Date Acquired: 01/16/2020 Wound Status: Open Weeks Of Treatment: 15 Comorbid Hypertension, Osteoarthritis, Received Chemotherapy, Clustered Wound:  No History: Received Radiation Wound Measurements Length: (cm) 2.2 Width: (cm) 2.5 Depth: (cm) 1.5 Area: (cm) 4.32 Volume: (cm) 6.48 % Reduction in Area: 32.1% % Reduction in Volume: 7.4% Epithelialization: None Tunneling: Yes Location 1 Position (o'clock): 4 Maximum Distance: (cm) 1.8 Location 2 Position (o'clock): 1 Maximum Distance: (cm) 1.4 Undermining: Yes Starting Position (o'clock): 9 Ending Position (o'clock): 4 Maximum Distance: (cm) 2 Wound Description Classification: Full Thickness Without Exposed Support Structures Wound Margin: Distinct, outline attached Exudate Amount: Large Exudate Type: Purulent Exudate Color: yellow, brown, green Foul Odor After Cleansing: Yes Due to Product Use: No Slough/Fibrino Yes Wound Bed Granulation Amount: Small (1-33%) Exposed Structure Granulation Quality: Pink Fascia Exposed: No Necrotic Amount: Large (67-100%) Fat Layer (Subcutaneous Tissue) Exposed: Yes Necrotic Quality: Adherent Slough Tendon Exposed: No Muscle Exposed: No Joint Exposed: No Bone Exposed: No Treatment Notes Wound #1 (Chest) Wound Laterality: Left Cleanser Wound Cleanser Discharge Instruction: Cleanse the wound with wound cleanser prior to applying a clean dressing using gauze sponges, not tissue or cotton balls. Peri-Wound Care Topical Primary Dressing KerraCel Ag  Gelling Fiber Dressing, 4x5 in (silver alginate) Discharge Instruction: Apply silver alginate pack into wound undermining and wound bedto wound bed as instructed Secondary Dressing Woven Gauze Sponge, Non-Sterile 4x4 in Discharge Instruction: Apply over primary dressing as directed. ABD Pad, 8x10 Discharge Instruction: Apply over primary dressing as directed. Secured With Compression Wrap Compression Stockings Environmental education officer) Signed: 07/25/2020 6:31:47 PM By: Deon Pilling Entered By: Deon Pilling on 07/25/2020  13:03:11 -------------------------------------------------------------------------------- Vitals Details Patient Name: Date of Service: [name] V [name] 07/25/2020 8:45 A M Medical Record Number: [id number] Patient Account Number: [id number] Date of Birth/Sex: Treating RN: 03/23/1970 (50 y.o. [name] Primary Care Annabell Oconnor: [name] Other Clinician: Referring Kimaria Struthers: Treating Anysia Choi/Extender: [name] in Treatment: 15 Vital Signs Time Taken: 10:00 Reference Range: 80 - 120 mg / dl Height (in): 67 Weight (lbs): 257 Body Mass Index (BMI): 40.2 Notes See HBO vital sign note. Electronic Signature(s) Signed: 07/25/2020 6:31:47 PM By: Deon Pilling Entered By: Deon Pilling on 07/25/2020 13:01:58

## 2020-07-25 NOTE — Progress Notes (Addendum)
[name], [name] ([id number]) Visit Report for 07/25/2020 HBO Details Patient Name: Date of Service: [name] 07/25/2020 10:00 A M Medical Record Number: [id number] Patient Account Number: [id number] Date of Birth/Sex: Treating RN: 02/03/1970 (50 y.o. 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 HBO Treatment Details Treatment Number: 1 Patient Type: Outpatient Chamber Type: Monoplace Chamber Serial #: [id number] Treatment Protocol: 2.5 ATA with 90 minutes oxygen, with two 5 minute air breaks Treatment Details Compression Rate Down: 2.0 psi / minute De-Compression Rate Up: 2.5 psi / minute A breaks and breathing ir Compress Tx Pressure periods Decompress Decompress Begins Reached (leave unused spaces Begins Ends blank) Chamber Pressure (ATA 1 2.5 2.5 2.5 2.5 2.5 - - 2.5 1 ) Clock Time (24 hr) 10:24 10:39 11:09 11:16 11:46 11:51 - - 12:21 12:31 Treatment Length: 127 (minutes) Treatment Segments: 4 Vital Signs Capillary Blood Glucose Reference Range: 80 - 120 mg / dl HBO Diabetic Blood Glucose Intervention Range: <131 mg/dl or >249 mg/dl Time Vitals Blood Respiratory Capillary Blood Glucose Pulse Action Type: Pulse: Temperature: Taken: Pressure: Rate: Glucose (mg/dl): Meter #: Oximetry (%) Taken: Pre 09:58 124/68 96 18 98.2 Post 12:32 122/77 77 18 98 Treatment Response Treatment Toleration: Well Treatment Completion Status: Treatment Completed without Adverse Event Calab Sachse Notes No concerns with treatment given. She was also seen for wound care evaluation Physician HBO Attestation: I certify  that I supervised this HBO treatment in accordance with Medicare guidelines. A trained emergency response team is readily available per Yes hospital policies and procedures. Continue HBOT as ordered. Yes Electronic Signature(s) Signed: 07/25/2020 5:24:41 PM By: [name] MD Previous Signature: 07/25/2020 5:02:12 PM Version By: [name] EMT Entered By: [name] on 07/25/2020 17:23:30 -------------------------------------------------------------------------------- HBO Safety Checklist Details Patient Name: Date of Service: [name] V [name] 07/25/2020 10:00 A M Medical Record Number: [id number] Patient Account Number: [id number] Date of Birth/Sex: Treating RN: [birth year] (50 y.o. [name], [name] Primary Care Elaijah Munoz: [name] Other Clinician: [name] Referring Zeanna Sunde: Treating Saurav Crumble/Extender: [name] in Treatment: 15 HBO Safety Checklist Items Safety Checklist Consent Form Signed Patient voided / foley secured and emptied When did you last eato Snack approx 0930 Last dose of injectable or oral agent n/a Ostomy pouch emptied and vented if applicable NA All implantable devices assessed, documented and approved port Intravenous access site secured and place NA Valuables secured Linens and cotton and cotton/polyester blend (less than 51% polyester) Personal oil-based products / skin lotions / body lotions removed Wigs or hairpieces removed NA Smoking or tobacco materials removed NA Books / newspapers / magazines / loose paper removed Cologne, aftershave, perfume and deodorant removed Jewelry removed (may wrap wedding band) Make-up removed Hair care products removed Battery operated devices (external) removed Heating patches and chemical warmers removed Titanium eyewear removed NA Nail polish cured greater than 10 hours Casting material cured greater than 10 hours NA Hearing aids  removed NA Loose dentures or partials removed Prosthetics have been removed NA Patient demonstrates correct use of air break device (if applicable) Patient concerns have been addressed Patient grounding bracelet on and cord attached to chamber Specifics for Inpatients (complete in addition to above)  Medication sheet sent with patient NA Intravenous medications needed or due during therapy sent with patient NA Drainage tubes (e.g. nasogastric tube or chest tube secured and vented) NA Endotracheal or Tracheotomy tube secured NA Cuff deflated of air and inflated with saline NA Airway suctioned NA Electronic Signature(s) Signed: 07/25/2020 12:46:36 PM By: [name] EMT Entered By: [name] on 07/25/2020 12:46:36

## 2020-07-25 NOTE — Progress Notes (Signed)
[name], [name] ([id number]) Visit Report for 07/25/2020 Physician Orders Details Patient Name: Date of Service: [name]. 07/25/2020 10:00 Sarah M Medical Record Number: [id number] Patient Account Number: [id number] Date of Birth/Sex: Treating RN: [birth year] (50 y.o. 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 Prim Dressing: KerraCel Ag Gelling Fiber Dressing, 4x5 in (silver alginate) 1 x Per Day/15 Days ary Discharge Instructions: Apply silver alginate pack into wound undermining and wound bedto wound bed as instructed Secondary Dressing: Woven Gauze Sponge, Non-Sterile 4x4 in 1 x Per Day/15 Days Discharge Instructions: Apply over primary dressing as directed. Secondary Dressing: ABD Pad, 8x10 1 x Per Day/15 Days Discharge Instructions: Apply over primary dressing as directed. Laboratory naerobe culture (MICRO) - culture of left mastectomy's site. - (ICD10 T81.31XD - Disruption of Bacteria identified in Unspecified specimen by Sarah external operation (surgical) wound, not elsewhere classified, subsequent encounter) LOINC Code: 676-1 Convenience Name: Anerobic culture Patient Medications llergies: carboplatin Sarah Notifications  Medication Indication Start End Ativan DOSE oral 1 mg tablet - 1 tablet oral PRN anxiety for HBO odor, large purulent 07/25/2020 Augmentin drainage from wound. DOSE oral 875 mg-125 mg tablet - 1 tablet oral BID x7 days Electronic Signature(s) Signed: 07/25/2020 5:24:41 PM By: Sarah Ham MD Signed: 07/25/2020 6:31:47 PM By: [name] Entered By: [name] on 07/25/2020 10:24:27 Prescription 07/25/2020 -------------------------------------------------------------------------------- [name]. Sarah Ham MD Patient Name: Provider: Aug 12, 1970 [id number] Date of Birth: NPI#: [id number] Sex: DEA #: [phone] [id number] Phone #: License #: [place] Patient Address: [place] [address] [place], [place] [address] [place], [place] [address] [phone] Allergies carboplatin Medication Note: This prescription was automatically generated because the medication is Sarah controlled substance, and cannot be prescribed electronically. Medication: Route: Strength: Form: Ativan oral 1 mg tablet Class: ANTI-ANXIETY - BENZODIAZEPINES Dose: Frequency / Time: Indication: 1 tablet oral PRN anxiety for HBO Number of Refills: Number of Units: 0 Thirty (30) Generic Substitution: Start Date: End Date: Administered at Facility: Substitution Permitted No Note to Pharmacy: Hand Signature: Date(s): Prescription 07/25/2020 [name], [name]. Sarah Ham MD Patient Name: Provider: January 20, 1970 [id number] Date of Birth: NPI#: [id number] Sex: DEA #: [id number] Phone #: License #: [place] Patient Address: [place] [address] [place], [place] [address] [place], [place] [address] [phone] Allergies carboplatin Medication Medication: Route: Strength: Form: Augmentin oral 875 mg-125 mg tablet Class: PENICILLIN  ANTIBIOTICS Dose: Frequency / Time: Indication: 1 tablet oral BID x7 days odor, large purulent drainage from wound. Number of Refills: Number of Units: 0 Generic Substitution: Start Date: End Date: Administered at Facility: Substitution Permitted 07/24/9468 No Note to Pharmacy: Hand Signature: Date(s): Electronic Signature(s) Signed: 07/25/2020 5:24:41 PM By: Sarah Ham MD Signed: 07/25/2020 6:31:47 PM By: [name] Entered  By: [name] on 07/25/2020 10:24:28 -------------------------------------------------------------------------------- Problem List Details Patient Name: Date of Service: Sarah BDELA Sarah L, LO V [name]. 07/25/2020 10:00 Sarah M Medical Record Number: [id number] Patient Account Number: [id number] Date of Birth/Sex: Treating RN: Dec 06, 1970 (50 y.o. 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 07/25/2020 Medical Record Number: [id number] Patient Account Number: [id number] Date of Birth/Sex: Treating RN: 15-Feb-1970 (50 y.o. 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 T81.31XD Disruption of external operation (surgical) wound, not elsewhere classified, subsequent encounter 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 Facility Procedures CPT4 Code: [id number] Description: G0277-(Facility Use Only) HBOT full body chamber, 3min , ICD-10 Diagnosis Description 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 T81.31XD Disruption of external operation (surgical) wound, not elsewhere classified, subse L59.8  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 Modifier: quent encounter Quantity: 4 Physician Procedures : CPT4 Code Description Modifier [id number] - WC PHYS HYPERBARIC OXYGEN THERAPY ICD-10 Diagnosis Description 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

## 2020-07-25 NOTE — Progress Notes (Signed)
[name], [name] ([id number]) Visit Report for 07/25/2020 HPI Details Patient Name: Date of Service: [name] [name] 07/25/2020 8:45 Sarah M Medical Record Number: [id number] Patient Account Number: [id number] Date of Birth/Sex: Treating RN: 1970/04/14 (50 y.o. [name] Primary Care Provider: Karle Khan Other Clinician: Referring Provider: Treating Provider/Extender: [name] in Treatment: 15 History of Present Illness HPI Description: ADMISSION 04/09/2020 [name] 50 year old woman who is history of breast cancer in the left breast goes back to 2018. He was found to have Sarah mass in her left breast. [name] and then subsequent chemo that she did not tolerate very well. She had seed implants placed but then underwent external beam Sarah radiation in the spring 2019. 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 Her adjunctive radiation was from 03/11/2017 through 04/21/2017. Unfortunately she had Sarah left breast biopsy on 11/24/2019 and it was again positive for an invasive ductal carcinoma grade 3 he is to resume at receptor 80% positive. HER-2 negative progesterone negative. She opted for bilateral mastectomies which were done on 01/09/2020 on the right with no evidence of malignancy on the left stage IIb invasive ductal carcinoma. She again received chemotherapy but she did not tolerate it because of wound dehiscence and low platelet counts according to the patient. Unfortunately her mastectomy wound dehisced on the left breast. She was taken back to the OR by [name] of general surgery on 33/24/22 and she underwent Sarah debridement and subsequent attempt at closure. She had been previously treated with Augmentin for suspected underlying infection. Once again the wound has dehisced in the mid  aspect. It is roughly 1 cm deep there is tunnels both medially and laterally she. She still has sutures in place. Still describes constant pain drainage. She also has Sarah drain still in place that drains about 10 cc/day. Currently she is changing ABD pad several times Sarah day 4/12; patient went back to see [name] who remove most of the stitches and debrided her wound. She has been using silver alginate culture I did last time which was Sarah PCR culture showed Peptostreptococcus, Enterococcus faecalis and Staphylococcus epidermidis which is of questionable significance. [name] that the sutures have been removed. There are undermining areas superior medial superior lateral and Sarah deep tunneling area in the inferior part of the wound. There is no obvious purulence. I started her on Augmentin on 04/12/2020 and I will likely extend this for another week AMAZINGLY; she is already been approved for HBO and will start today. [name] Echocardiogram which I reviewed from 2019 showed Sarah normal ejection fraction.. I went over side effects of HBO with the patient and answered any of her questions. Her husband was present 5/3; [name]. She went to see [name] about 2-1/2 weeks ago according to the patient he can remove some of the surrounding debris. She will see him again later this week or early next week. She has been using silver alginate. Sarah lot of this is problematic since she has Medicaid which traditionally does not cover wound care supplies although it traditionally does not cover HBO either Her wound actually looks quite Sarah bit better than the last time I saw this better looking surface healthy looking  tissue. She has tunnels at 810 2 in 6 the deepest of these is at 2 at 2.2 cm there is no exposed bone. 5/10; the patient was coming in for nurse visit today but requested that I see the wound because of surface discoloration. She  states the Iodoflex makes the surface of this turned "white". She is not really complaining of undue tenderness. She is changing this every second day. The goal of this had been to get the surface of the wound to Sarah vibrant red color and then potentially look at changing to Sarah silver collagen-based dressing, Puraply, and then perhaps an alternative skin substituteo Derma vest. The patient tells me she has an appoint with her oncologist [name] on the 19th. She apparently think she is going to get another PET scan. Based on this I would like his comment about whether there is possibility of any malignancy in the wound itself or just underneath it. Up to now we have not really thought that was true 5/17; nice improvement in the chest wound. Less depth. We have been using Iodoflex to clean off the wound surface this seems to have done nicely. Patient went to see [name] 2 days ago who has referred her to see [name]. I certainly agree with the thought the surface of the wound looks Sarah lot better now and I think consideration of plastic surgery is certainly something the patient should discuss. I have talked to the patient about this today and I certainly support Sarah conversation with [name]. I also wondered about is there any residual cancer in this. [name] apparently did not seem to think so per the patient although I do not see that in the text conversations we have had. I also wonder about Sarah skin substitute if her version of Medicare [[employer]] would cover this. [name] is apparently considering starting her on [place]. Patient is already very scared about the side effects but she will discuss this with [name] on 5/19 5/31; patient's wound about the same in terms of size she still has the tunneling area to the left. Surface of the wound looks Sarah lot better there has been some filling in the granulation I have been included on serial text between [name] surgery]  [name] oncology] and [name] surgery]. I think there is not Sarah lot of thought that the patient has recurrence of her cancer her MRI was indeterminant but [name] did not feel that there was any reason to think she has Sarah major recurrence and he was not in favor of doing Sarah dedicated breast MRI. [name] wants to go ahead with Sarah flap closure I did it. I have not reviewed his note but per the patient [name] latissimus dorsi myocutaneous flap or I think Sarah skin flap from her abdomen. I have talked to the patient I think it is time to attempt this. If I can continue hyperbarics surrounding the flap then we will attempt to do this 6/14; patient using Prisma as the primary dressing here. She has completed hyperbarics and did very well. Her wound is responded nicely. The depth of this and the condition of the granulation tissue looks Sarah lot better. She has undermining areas at 2 and 10:00 both of these seem to be better as well. She is scheduled for flap closure on 7/11 by [name] 6/29; the patient continues to use Prisma wet-to-dry. Her graft is scheduled for 7/11 by  [name]. We are looking at getting her additional hyperbaric treatment which I think might help with the likelihood of graft survival in this difficult irradiated area 7/21; patient returns to clinic to reinitiate HBO after her surgical procedure by [name]. On 07/15/2020 she had debridement of left chest wound and Sarah rotational flap closure. She also had excision of right excess axillary tissue. Pathology did not show any malignancy and follow-up [name] saw the patient yesterday. He noted Sarah 2 cm area of wound dehiscence. Recommended she return for hyperbaric oxygen and she returns today for her first treatment. Follow-up with him in 2 weeks. We were not actually going to see the patient today her appointment to be evaluated was Monday however she was concerned about Sarah "fishy odor". She is also noted some  drainage. Not specifically dressing in the wound area but covering with Adaptic Electronic Signature(s) Signed: 07/25/2020 5:24:41 PM By: [name] MD Entered By: [name] on 07/25/2020 13:03:57 -------------------------------------------------------------------------------- Physical Exam Details Patient Name: Date of Service: Sarah Crochet V [name] 07/25/2020 8:45 Sarah M Medical Record Number: [id number] Patient Account Number: [id number] Date of Birth/Sex: Treating RN: 09/05/1970 (50 y.o. [name] Primary Care Provider: Karle Khan Other Clinician: Referring Provider: Treating Provider/Extender: [name] in Treatment: 15 Notes Wound exam; most of the patient's extensive surgical incision is healed however at its most proximal aspect close to the anterior axillary line there is indeed Sarah dehiscence. 3 small probing areas without Sarah lot of depth however 1 of these has Sarah considerable amount of serous drainage. There is indeed odor. No surrounding soft tissue erythema no surrounding soft tissue tenderness. Electronic Signature(s) Signed: 07/25/2020 5:24:41 PM By: [name] MD Entered By: [name] on 07/25/2020 13:05:21 -------------------------------------------------------------------------------- Physician Orders Details Patient Name: Date of Service: Sarah Crochet V [name] 07/25/2020 8:45 Sarah M Medical Record Number: [id number] Patient Account Number: [id number] Date of Birth/Sex: Treating RN: August 09, 1970 (50 y.o. 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 Dellia Nims Monday. Return Sarah Bathing/ Shower/ Hygiene May shower with protection but do not get wound dressing(s) wet. Wound Treatment Wound #1 - Chest Wound Laterality: Left Cleanser: Wound Cleanser 1 x Per Day/15 Days Discharge Instructions: Cleanse the wound with wound cleanser prior to applying Sarah clean dressing using gauze sponges, not tissue or cotton balls. Prim Dressing: KerraCel Ag Gelling Fiber Dressing, 4x5 in (silver alginate) ary 1 x Per Day/15 Days Discharge Instructions: Apply silver alginate pack into wound undermining and wound bedto wound bed as instructed Secondary Dressing: Woven Gauze Sponge, Non-Sterile 4x4 in 1 x Per Day/15 Days Discharge Instructions: Apply over primary dressing as directed. Secondary Dressing: ABD Pad, 8x10 1 x Per Day/15 Days Discharge Instructions: Apply over primary dressing as directed. Electronic Signature(s) Signed: 07/25/2020 5:24:41 PM By: [name] MD Signed: 07/25/2020 6:31:47 PM By: Deon Pilling Entered By: Deon Pilling on 07/25/2020 13:03:50 -------------------------------------------------------------------------------- Problem List Details Patient Name: Date of Service: [name], Sarah V [name] 07/25/2020 8:45 Sarah M Medical  Record Number: [id number] Patient Account Number: [id number] Date of Birth/Sex: Treating RN: 01/26/1970 (50 y.o. 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 07/25/2020 8:45 Sarah M Medical Record Number: [id number] Patient Account Number: [id number] Date of Birth/Sex: Treating RN: Jan 26, 1970 (50 y.o. [name] Primary Care Provider: Karle Khan Other Clinician: Referring Provider: Treating Provider/Extender: [name] in Treatment: 15 Subjective History of Present Illness (HPI) ADMISSION 04/09/2020 [name] 50 year old woman who is history of breast cancer in the left breast goes back to 2018. He was found to have Sarah mass in her left breast. [name] and then subsequent chemo that she did not tolerate very well. She had seed implants placed but then underwent external beam Sarah radiation in the spring 2019. 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 Her adjunctive radiation was from 03/11/2017 through 04/21/2017. Unfortunately she had Sarah left breast biopsy on 11/24/2019 and it was again positive for an invasive ductal carcinoma grade 3 he is to resume at receptor 80% positive. HER-2 negative progesterone negative. She opted for bilateral mastectomies which were done on 01/09/2020 on the right with no evidence of malignancy on the left stage IIb invasive ductal carcinoma. She again received chemotherapy but she did not  tolerate it because of wound dehiscence and low platelet counts according to the patient. Unfortunately her mastectomy wound dehisced on the left breast. She was taken back to the OR by [name] of general surgery on 33/24/22 and she underwent Sarah debridement and subsequent attempt at closure. She had been previously treated with Augmentin for suspected underlying infection. Once again the wound has dehisced in the mid aspect. It is roughly 1 cm deep there is tunnels both medially and laterally she. She still has sutures in place. Still describes constant pain drainage. She also has Sarah drain still in place that drains about 10 cc/day. Currently she is changing ABD pad several times Sarah day 4/12; patient went back to see [name] who remove most of the stitches and debrided her wound. She has been using silver alginate culture I did last time which was Sarah PCR culture showed Peptostreptococcus, Enterococcus faecalis and Staphylococcus epidermidis which is of questionable significance. [name] that the sutures have been removed. There are undermining areas superior medial superior lateral and Sarah deep tunneling area in the inferior part of the wound. There is no obvious purulence. I started her on Augmentin on 04/12/2020 and I will likely extend this for another week AMAZINGLY; she is already been approved for HBO and will start today. [name] Echocardiogram which I reviewed from 2019 showed Sarah normal ejection fraction.Marland Kitchen  I went over side effects of HBO with the patient and answered any of her questions. Her husband was present 5/3; [name]. She went to see [name] about 2-1/2 weeks ago according to the patient he can remove some of the surrounding debris. She will see him again later this week or early next week. She has been using silver alginate. Sarah lot of this is problematic since she has Medicaid which traditionally does  not cover wound care supplies although it traditionally does not cover HBO either Her wound actually looks quite Sarah bit better than the last time I saw this better looking surface healthy looking tissue. She has tunnels at 810 2 in 6 the deepest of these is at 2 at 2.2 cm there is no exposed bone. 5/10; the patient was coming in for nurse visit today but requested that I see the wound because of surface discoloration. She states the Iodoflex makes the surface of this turned "white". She is not really complaining of undue tenderness. She is changing this every second day. The goal of this had been to get the surface of the wound to Sarah vibrant red color and then potentially look at changing to Sarah silver collagen-based dressing, Puraply, and then perhaps an alternative skin substituteo Derma vest. The patient tells me she has an appoint with her oncologist [name] on the 19th. She apparently think she is going to get another PET scan. Based on this I would like his comment about whether there is possibility of any malignancy in the wound itself or just underneath it. Up to now we have not really thought that was true 5/17; nice improvement in the chest wound. Less depth. We have been using Iodoflex to clean off the wound surface this seems to have done nicely. Patient went to see [name] 2 days ago who has referred her to see [name]. I certainly agree with the thought the surface of the wound looks Sarah lot better now and I think consideration of plastic surgery is certainly something the patient should discuss. I have talked to the patient about this today and I certainly support Sarah conversation with [name]. I also wondered about is there any residual cancer in this. [name] apparently did not seem to think so per the patient although I do not see that in the text conversations we have had. I also wonder about Sarah skin substitute if her version of Medicare [BCCP] would cover this. Dr.  Jana Khan is apparently considering starting her on [place]. Patient is already very scared about the side effects but she will discuss this with [name] on 5/19 5/31; patient's wound about the same in terms of size she still has the tunneling area to the left. Surface of the wound looks Sarah lot better there has been some filling in the granulation I have been included on serial text between [name] surgery] [name] oncology] and [name] surgery]. I think there is not Sarah lot of thought that the patient has recurrence of her cancer her MRI was indeterminant but [name] did not feel that there was any reason to think she has Sarah major recurrence and he was not in favor of doing Sarah dedicated breast MRI. [name] wants to go ahead with Sarah flap closure I did it. I have not reviewed his note but per the patient [name] latissimus dorsi myocutaneous flap or I  think Sarah skin flap from her abdomen. I have talked to the patient I think it is time to attempt this. If I can continue hyperbarics surrounding the flap then we will attempt to do this 6/14; patient using Prisma as the primary dressing here. She has completed hyperbarics and did very well. Her wound is responded nicely. The depth of this and the condition of the granulation tissue looks Sarah lot better. She has undermining areas at 2 and 10:00 both of these seem to be better as well. She is scheduled for flap closure on 7/11 by [name] 6/29; the patient continues to use Prisma wet-to-dry. Her graft is scheduled for 7/11 by [name]. We are looking at getting her additional hyperbaric treatment which I think might help with the likelihood of graft survival in this difficult irradiated area 7/21; patient returns to clinic to reinitiate HBO after her surgical procedure by [name]. On 07/15/2020 she had debridement of left chest wound and Sarah rotational flap closure. She also had excision of right excess axillary  tissue. Pathology did not show any malignancy and follow-up [name] saw the patient yesterday. He noted Sarah 2 cm area of wound dehiscence. Recommended she return for hyperbaric oxygen and she returns today for her first treatment. Follow-up with him in 2 weeks. We were not actually going to see the patient today her appointment to be evaluated was Monday however she was concerned about Sarah "fishy odor". She is also noted some drainage. Not specifically dressing in the wound area but covering with Adaptic Objective Constitutional Vitals Time Taken: 10:00 AM, Height: 67 in, Weight: 257 lbs, BMI: 40.2. General Notes: See HBO vital sign note. Integumentary (Hair, Skin) Wound #1 status is Open. Original cause of wound was Surgical Injury. The date acquired was: 01/16/2020. The wound has been in treatment 15 weeks. The wound is located on the Left Chest. The wound measures 2.2cm length x 2.5cm width x 1.5cm depth; 4.32cm^2 area and 6.48cm^3 volume. There is Fat Layer (Subcutaneous Tissue) exposed. Tunneling has been noted at 4:00 with Sarah maximum distance of 1.8cm. There is additional tunneling and at 1:00 with Sarah maximum distance of 1.4cm. Undermining begins at 9:00 and ends at 4:00 with Sarah maximum distance of 2cm. There is Sarah large amount of purulent drainage noted. Foul odor after cleansing was noted. The wound margin is distinct with the outline attached to the wound base. There is small (1-33%) pink granulation within the wound bed. There is Sarah large (67-100%) amount of necrotic tissue within the wound bed including Adherent Slough. 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 Dellia Nims  Monday. Bathing/ Shower/ Hygiene: May shower with protection but do not get wound dressing(s) wet. WOUND #1: - Chest Wound Laterality: Left Cleanser: Wound Cleanser 1 x Per Day/15 Days Discharge Instructions: Cleanse the wound with wound cleanser prior to applying Sarah clean dressing using gauze sponges, not tissue or cotton balls. Prim Dressing: KerraCel Ag Gelling Fiber Dressing, 4x5 in (silver alginate) 1 x Per Day/15 Days ary Discharge Instructions: Apply silver alginate pack into wound undermining and wound bedto wound bed as instructed Secondary Dressing: Woven Gauze Sponge, Non-Sterile 4x4 in 1 x Per Day/15 Days Discharge Instructions: Apply over primary dressing as directed. Secondary Dressing:  ABD Pad, 8x10 1 x Per Day/15 Days Discharge Instructions: Apply over primary dressing as directed. 1. I obtained Sarah culture of the drainage coming from one of the 3 probing areas. Empiric Augmentin while we await culture results 2. Silver alginate gently packed in the tunnel in the layered over the top. She can change this daily. 3. Initiated hyperbaric oxygen therapy today shows usual she tolerates this well 2.5 atm with two 5-minute air breaks Electronic Signature(s) Signed: 07/25/2020 5:24:41 PM By: [name] MD Entered By: [name] on 07/25/2020 13:06:09 -------------------------------------------------------------------------------- SuperBill Details Patient Name: Date of Service: Sarah [name] 07/25/2020 Medical Record Number: [id number] Patient Account Number: [id number] Date of Birth/Sex: Treating RN: [birth year] (50 y.o. 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 Facility Procedures CPT4 Code: [id number] Description: 99213 - WOUND CARE VISIT-LEV 3 EST PT Modifier: Quantity: 1 Physician Procedures : CPT4 Code Description Modifier [id number] - WC PHYS LEVEL 4 - EST PT ICD-10 Diagnosis Description T81.31XD Disruption of external operation (surgical) wound, not elsewhere classified, subsequent encounter 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  and subcutaneous tissue related to radiation Quantity: 1 Electronic Signature(s) Signed: 07/25/2020 5:24:41 PM By: [name] MD Entered By: [name] on 07/25/2020 13:07:01

## 2020-07-26 ENCOUNTER — Encounter (HOSPITAL_BASED_OUTPATIENT_CLINIC_OR_DEPARTMENT_OTHER): Payer: Medicaid Other | Admitting: Internal Medicine

## 2020-07-26 ENCOUNTER — Encounter: Payer: Self-pay | Admitting: Oncology

## 2020-07-26 ENCOUNTER — Other Ambulatory Visit (HOSPITAL_COMMUNITY): Payer: Self-pay

## 2020-07-26 ENCOUNTER — Other Ambulatory Visit: Payer: Self-pay

## 2020-07-26 DIAGNOSIS — T8131XD Disruption of external operation (surgical) wound, not elsewhere classified, subsequent encounter: Secondary | ICD-10-CM | POA: Diagnosis not present

## 2020-07-26 DIAGNOSIS — L59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: Secondary | ICD-10-CM

## 2020-07-26 DIAGNOSIS — Z901 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: Secondary | ICD-10-CM

## 2020-07-26 DIAGNOSIS — D0512 Intraductal carcinoma in situ of left breast: Secondary | ICD-10-CM

## 2020-07-26 MED ORDER — TRAMADOL HCL 50 MG PO TABS
ORAL_TABLET | Freq: Four times a day (QID) | ORAL | 0 refills | Status: DC | PRN
  Filled 2020-07-26: qty 30, 4d supply, fill #0

## 2020-07-26 NOTE — Progress Notes (Addendum)
Sarah, Khan ([id number]) Visit Report for 07/26/2020 HBO Details Patient Name: Date of Service: [name] 07/26/2020 10:00 A M Medical Record Number: [id number] Patient Account Number: [id number] Date of Birth/Sex: Treating RN: September 13, 1970 (50 y.o. 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 HBO Treatment Details Treatment Number: 2 Patient Type: Outpatient Chamber Type: Monoplace Chamber Serial #: [id number] Treatment Protocol: 2.5 ATA with 90 minutes oxygen, with two 5 minute air breaks Treatment Details Compression Rate Down: 1.5 psi / minute De-Compression Rate Up: 2.5 psi / minute A breaks and breathing ir Compress Tx Pressure periods Decompress Decompress Begins Reached (leave unused spaces Begins Ends blank) Chamber Pressure (ATA 1 2.5 2.5 2.5 2.5 2.5 - - 2.5 1 ) Clock Time (24 hr) 09:56 10:11 10:41 10:46 11:16 11:21 - - 11:51 12:01 Treatment Length: 125 (minutes) Treatment Segments: 4 Vital Signs Capillary Blood Glucose Reference Range: 80 - 120 mg / dl HBO Diabetic Blood Glucose Intervention Range: <131 mg/dl or >249 mg/dl Time Vitals Blood Respiratory Capillary Blood Glucose Pulse Action Type: Pulse: Temperature: Taken: Pressure: Rate: Glucose (mg/dl): Meter #: Oximetry (%) Taken: Pre 09:52 141/80 93 18 97.8 Post 12:05 119/69 75 18 97.7 Treatment Response Treatment Toleration: Well Treatment Completion Status: Treatment Completed without Adverse Event Physician HBO Attestation: I certify that I supervised this HBO treatment in accordance with Medicare guidelines. A trained  emergency response team is readily available per Yes hospital policies and procedures. Continue HBOT as ordered. Yes Electronic Signature(s) Signed: 07/26/2020 3:19:39 PM By: Sarah Shan DO Previous Signature: 07/26/2020 12:25:24 PM Version By: [name] EMT Entered By: [name] on 07/26/2020 15:19:08 -------------------------------------------------------------------------------- HBO Safety Checklist Details Patient Name: Date of Service: [name] [name] 07/26/2020 10:00 A M Medical Record Number: [id number] Patient Account Number: [id number] Date of Birth/Sex: Treating RN: 1970-04-14 (50 y.o. [name] Primary Care [name]: [name] Other Clinician: Donavan Khan Referring [name]: Treating [name]/Extender: [name] in Treatment: 15 HBO Safety Checklist Items Safety Checklist Consent Form Signed Patient voided / foley secured and emptied When did you last eato Snack approx 0930 Last dose of injectable or oral agent n/a Ostomy pouch emptied and vented if applicable NA All implantable devices assessed, documented and approved NA Intravenous access site secured and place NA Valuables secured Linens and cotton and cotton/polyester blend (less than 51% polyester) Personal oil-based products / skin lotions / body lotions removed Wigs or hairpieces removed NA Smoking or tobacco materials removed NA Books / newspapers / magazines / loose paper removed Cologne, aftershave, perfume and deodorant removed Jewelry removed (may wrap wedding band) Make-up removed Hair care products removed Battery operated devices (external) removed Heating patches and chemical warmers removed Titanium eyewear removed NA Nail polish cured greater than 10 hours Casting material cured greater than 10 hours NA Hearing aids removed NA Loose dentures or partials removed Prosthetics have been removed NA Patient  demonstrates correct use of air break device (if applicable) Patient concerns have been addressed Patient grounding bracelet on and cord attached to chamber Specifics for Inpatients (complete in addition to above) Medication sheet sent with patient NA Intravenous medications needed or due during therapy sent with  patient NA Drainage tubes (e.g. nasogastric tube or chest tube secured and vented) NA Endotracheal or Tracheotomy tube secured NA Cuff deflated of air and inflated with saline NA Airway suctioned NA Electronic Signature(s) Signed: 07/26/2020 12:23:37 PM By: [name] EMT Entered By: [name] on 07/26/2020 12:23:37

## 2020-07-26 NOTE — Progress Notes (Addendum)
[name] ([id number]) Visit Report for 07/25/2020 Arrival Information Details Patient Name: Date of Service: [name] [name] 07/25/2020 10:00 Sarah M Medical Record Number: [id number] Patient Account Number: [id number] Date of Birth/Sex: Treating RN: [birth year] (50 y.o. [name], [name] Primary Care Babbette Dalesandro: [name] Other Clinician: [name] Referring Aeric Burnham: Treating Sheridyn Canino/Extender: [name] in Treatment: 15 Visit Information History Since Last Visit All ordered tests and consults were completed: Yes Patient Arrived: Ambulatory Added or deleted any medications: No Arrival Time: 09:45 Any new allergies or adverse reactions: No Accompanied By: Self Had Sarah fall or experienced change in No Transfer Assistance: None activities of daily living that may affect Patient Identification Verified: Yes risk of falls: Secondary Verification Process Completed: Yes Signs or symptoms of abuse/neglect since last visito No Patient Requires Transmission-Based Precautions: No Hospitalized since last visit: No Patient Has Alerts: No Implantable device outside of the clinic excluding No cellular tissue based products placed in the center since last visit: Pain Present Now: No Electronic Signature(s) Signed: 07/25/2020 12:43:29 PM By: [name] EMT Entered By: [name] on 07/25/2020 12:43:29 -------------------------------------------------------------------------------- Encounter Discharge Information Details Patient Name: Date of Service: Sarah Crochet V [name] 07/25/2020 10:00 Sarah M Medical Record Number: [id number] Patient Account Number: [id number] Date of Birth/Sex: Treating RN: 01-23-1970 (50 y.o. [name] Primary Care Ozie Dimaria: [name] Other Clinician: [name] Referring Oliwia Berzins: Treating Sevyn Paredez/Extender: [name] in Treatment: 15 Encounter Discharge  Information Items Discharge Condition: Stable Ambulatory Status: Ambulatory Discharge Destination: Home Transportation: Private Auto Accompanied By: Daughter Schedule Follow-up Appointment: No Clinical Summary of Care: Electronic Signature(s) Signed: 07/25/2020 5:02:50 PM By: [name] EMT Previous Signature: 07/25/2020 12:48:35 PM Version By: [name] EMT Entered By: [name] on 07/25/2020 17:02:50 -------------------------------------------------------------------------------- Vitals Details Patient Name: Date of Service: Sarah Crochet V [name] 07/25/2020 10:00 Sarah M Medical Record Number: [id number] Patient Account Number: [id number] Date of Birth/Sex: Treating RN: 10-03-1970 (50 y.o. [name], [name] Primary Care Hasnain Manheim: [name] Other Clinician: [name] Referring Iyonna Rish: Treating Shunsuke Granzow/Extender: [name] in Treatment: 15 Vital Signs Time Taken: 09:58 Temperature (F): 98.2 Height (in): 67 Pulse (bpm): 96 Weight (lbs): 257 Respiratory Rate (breaths/min): 18 Body Mass Index (BMI): 40.2 Blood Pressure (mmHg): 124/68 Reference Range: 80 - 120 mg / dl Electronic Signature(s) Signed: 07/25/2020 12:44:03 PM By: [name] EMT Entered By: [name] on 07/25/2020 12:44:03

## 2020-07-26 NOTE — Progress Notes (Addendum)
[name], [name] ([id number]) Visit Report for 07/26/2020 Arrival Information Details Patient Name: Date of Service: [name] 07/26/2020 10:00 A M Medical Record Number: [id number] Patient Account Number: [id number] Date of Birth/Sex: Treating RN: Jan 11, 1970 (50 y.o. [name], [name] Primary Care Cordia Miklos: [name] Other Clinician: [name] Referring Dodi Leu: Treating Hershell Brandl/Extender: [name] in Treatment: 15 Visit Information History Since Last Visit All ordered tests and consults were completed: Yes Patient Arrived: Ambulatory Added or deleted any medications: No Arrival Time: 09:45 Any new allergies or adverse reactions: No Accompanied By: self Had a fall or experienced change in No Transfer Assistance: None activities of daily living that may affect Patient Identification Verified: Yes risk of falls: Secondary Verification Process Completed: Yes Signs or symptoms of abuse/neglect since last visito No Patient Requires Transmission-Based Precautions: No Hospitalized since last visit: No Patient Has Alerts: No Implantable device outside of the clinic excluding No cellular tissue based products placed in the center since last visit: Pain Present Now: No Electronic Signature(s) Signed: 07/26/2020 12:19:21 PM By: [name] EMT Entered By: [name] on 07/26/2020 12:19:21 -------------------------------------------------------------------------------- Encounter Discharge Information Details Patient Name: Date of Service: [name] [name] 07/26/2020 10:00 A M Medical Record Number: [id number] Patient Account Number: [id number] Date of Birth/Sex: Treating RN: 1970/03/10 (50 y.o. [name] Primary Care Adelina Collard: [name] Other Clinician: [name] Referring Santana Edell: Treating Dorie Ohms/Extender: [name] in Treatment: 15 Encounter  Discharge Information Items Discharge Condition: Stable Ambulatory Status: Ambulatory Discharge Destination: Home Transportation: Private Auto Accompanied By: self Schedule Follow-up Appointment: No Clinical Summary of Care: Electronic Signature(s) Signed: 07/26/2020 12:26:10 PM By: [name] EMT Entered By: [name] on 07/26/2020 12:26:10 -------------------------------------------------------------------------------- Vitals Details Patient Name: Date of Service: [name] [name] 07/26/2020 10:00 A M Medical Record Number: [id number] Patient Account Number: [id number] Date of Birth/Sex: Treating RN: December 28, 1970 (50 y.o. [name] Primary Care Timur Nibert: [name] Other Clinician: [name] Referring Ilea Hilton: Treating Montoya Brandel/Extender: [name] in Treatment: 15 Vital Signs Time Taken: 12:05 Temperature (F): 97.8 Height (in): 67 Pulse (bpm): 93 Weight (lbs): 257 Respiratory Rate (breaths/min): 18 Body Mass Index (BMI): 40.2 Blood Pressure (mmHg): 141/80 Reference Range: 80 - 120 mg / dl Electronic Signature(s) Signed: 07/26/2020 12:28:57 PM By: [name] EMT Previous Signature: 07/26/2020 12:21:17 PM Version By: [name] EMT Entered By: [name] on 07/26/2020 12:28:57

## 2020-07-26 NOTE — Progress Notes (Signed)
KENNADEE, KULIKOWSKI ([id number]) Visit Report for 07/26/2020 SuperBill Details Patient Name: Date of Service: [name] 07/26/2020 Medical Record Number: [id number] Patient Account Number: [id number] Date of Birth/Sex: Treating RN: Dec 08, 1970 (50 y.o. 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 Facility Procedures CPT4 Code Description Modifier Quantity [id number]-(Facility Use Only) HBOT full body chamber, 74mn , 4 ICD-10 Diagnosis Description 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.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 Disruption of external operation (surgical) wound, not elsewhere classified, subsequent encounter 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 Physician Procedures Quantity CPT4 Code Description Modifier [id number] PHYS HYPERBARIC OXYGEN THERAPY 1 ICD-10 Diagnosis Description 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

## 2020-07-29 ENCOUNTER — Encounter (HOSPITAL_BASED_OUTPATIENT_CLINIC_OR_DEPARTMENT_OTHER): Payer: Medicaid Other | Admitting: Internal Medicine

## 2020-07-29 ENCOUNTER — Other Ambulatory Visit: Payer: Self-pay

## 2020-07-29 DIAGNOSIS — T8131XD Disruption of external operation (surgical) wound, not elsewhere classified, subsequent encounter: Secondary | ICD-10-CM | POA: Diagnosis not present

## 2020-07-29 LAB — AEROBIC CULTURE W GRAM STAIN (SUPERFICIAL SPECIMEN)

## 2020-07-29 NOTE — Progress Notes (Signed)
[name] ([id number]) Visit Report for Khan Khan Details Patient Name: Date of Service: [name] [name] 9:30 [name] Medical Record Number: [id number] Patient Account Number: [id number] Date of Birth/Sex: Treating RN: August 22, 1970 (50 y.o. [name], [name] Primary Care Provider: [name] Other Clinician: Referring Provider: Treating Provider/Extender: [name] in Treatment: 15 Khan Performed for Assessment: Khan #1 Left Chest Performed By: Physician Ricard Dillon., MD Khan Type: Khan Level of Consciousness (Pre-procedure): Awake and Alert Pre-procedure Verification/Time Out Yes - 10:34 Taken: Start Time: 10:34 T Area Debrided (L x W): otal 2.7 (cm) x 3.3 (cm) = 8.91 (cm) Tissue and other material debrided: Viable, Non-Viable, Fat, Slough, Subcutaneous, Slough Level: Skin/Subcutaneous Tissue Khan Description: Excisional Instrument: Curette Bleeding: Minimum Hemostasis Achieved: Pressure End Time: 10:35 Procedural Pain: 0 Post Procedural Pain: 0 Response to Treatment: Procedure was tolerated well Level of Consciousness (Post- Awake and Alert procedure): Post Khan Measurements of Total Khan Length: (cm) 2.7 Width: (cm) 3.3 Depth: (cm) 1.6 Volume: (cm) 11.197 Character of Khan/Ulcer Post Khan: Requires Further Khan Post Procedure Diagnosis Same as Pre-procedure Electronic Signature(s) Signed: 07/29/2020 12:43:15 PM By: [name] MD Signed: 07/29/2020 4:44:07 PM By: Levan Hurst RN, BSN Entered By: [name] on 07/29/2020 10:44:35 -------------------------------------------------------------------------------- HPI Details Patient Name: Date of Service: [name] [name] 9:30 [name] Medical Record Number: [id number] Patient Account Number: [id number] Date of Birth/Sex: Treating RN: [birth year] (50 y.o. [name] Primary Care Provider: [name] Other Clinician: Referring Provider: Treating Provider/Extender: [name] in Treatment: 15 History of Present Illness HPI Description: ADMISSION 04/09/2020 [name] who is history of breast cancer in the left breast goes back to 2018. He was found to have [name] in Sarah left breast. Sarah underwent [name] and then subsequent chemo that Sarah did not tolerate very well. Sarah had seed implants placed but then underwent external beam Sarah radiation in the spring Khan. 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 Sarah had [name]. Likely pathogenic. Sarah adjunctive radiation was from 3/7/Khan through 4/17/Khan. Unfortunately Sarah had [name] and it was again positive for an invasive ductal carcinoma grade 3 he is to resume at receptor 80% positive. Sarah-2 negative progesterone negative. Sarah opted for bilateral mastectomies which were done on 01/09/2020 on the right with no evidence of malignancy on the left stage IIb invasive ductal carcinoma. Sarah again received chemotherapy but Sarah did not tolerate it because of Khan dehiscence and low platelet counts according to the patient. Unfortunately Sarah mastectomy Khan dehisced on the left breast. Sarah was taken back to the OR by [name] of general surgery on 33/24/22 and Sarah underwent [name] and subsequent attempt at closure. Sarah had been previously treated with Augmentin for suspected underlying infection. Once again the Khan has dehisced in the mid aspect. It is roughly 1 cm deep there is tunnels both medially and laterally Sarah. Sarah still has Khan in place. Still describes constant pain drainage. Sarah also has [name] still in place that drains about 10 cc/Khan. Currently Sarah is changing ABD pad several times [name] 4/12;  patient went back to see [name] who remove most of the stitches and debrided [name]. Sarah has been using silver alginate Khan I did  last time which was [name], Enterococcus faecalis and Staphylococcus epidermidis which is of questionable Khan. Sarah has [name] that the Khan have been removed. There are undermining areas superior medial superior lateral and Sarah deep tunneling area in the inferior part of the Khan. There is no obvious purulence. I started Sarah on Augmentin on 04/12/2020 and I will likely extend this for another week AMAZINGLY; Sarah is already been approved for Khan and will start today. Sarah chest x-ray was negative. Echocardiogram which I reviewed from Khan showed [name].. I went over side effects of Khan with the patient and answered any of Sarah questions. [name] while since have seen this Khan. Sarah went to see [name] about 2-1/2 weeks ago according to the patient he can remove some of the surrounding debris. Sarah will see him again later this week or early next week. [name] since Sarah has Medicaid which traditionally does not cover Khan care supplies although it traditionally does not cover Khan either [name] actually looks quite Sarah bit better than the last time I saw this better looking surface healthy looking tissue. Sarah has tunnels at 810 2 in 6 the deepest of these is at 2 at 2.2 cm there is no exposed bone. 5/10; the patient was coming in for nurse visit today but requested that I see the Khan because of surface discoloration. Sarah states the Iodoflex makes the surface of this turned "white". Sarah is not really complaining of undue tenderness. Sarah is changing this every second Khan. The goal of this had been to get the surface of the Khan to [name] and then potentially look at changing to [name], Puraply, and then perhaps an alternative skin substituteo Derma vest. The patient tells me Sarah has an appoint with Sarah oncologist [name] on the 19th. Sarah apparently think Sarah is going to get another PET scan. Based on this I would like his comment about whether there is possibility of any malignancy in the Khan itself or just underneath it. Up to Khan we have not really thought that was true 5/17; nice improvement in the chest Khan. Less depth. We have been using Iodoflex to clean off the Khan surface this seems to have done nicely. Patient went to see [name] 2 days ago who has referred Sarah to see Dr. Claudia Desanctis. I certainly agree with the thought the surface of the Khan looks Sarah lot better Khan and I think consideration of plastic surgery is certainly something the patient should discuss. I have talked to the patient about this today and I certainly support Sarah conversation with Dr. Claudia Desanctis. I also wondered about is there any residual cancer in this. [name] apparently did not seem to think so per the patient although I do not see that in the text conversations we have had. I also wonder about Sarah skin substitute if Sarah version of Medicare [[employer]] would cover this. [name] is apparently considering starting Sarah on [place]. Patient is already very scared about the side effects but Sarah will discuss this with [name] on 5/19 5/31; patient's Khan about the same in terms of size Sarah still has the tunneling area to the left. Surface of the Khan looks Sarah lot better there has been some filling in the granulation I have been included on  serial text between Dr. Callie Fielding surgery] Dr. Ruffin Frederick oncology] and Dr. Para Skeans surgery]. I think there is not Sarah lot of thought that the patient has recurrence of Sarah cancer Sarah MRI was indeterminant but [name] did not feel that there was any reason to think Sarah has Sarah major recurrence and he was  not in favor of doing Sarah dedicated breast MRI. Dr. Claudia Desanctis wants to go ahead with Sarah flap closure I did it. I have not reviewed his note but per the patient [name] latissimus dorsi myocutaneous flap or I think Sarah skin flap from Sarah abdomen. I have talked to the patient I think it is time to attempt this. If I can continue hyperbarics surrounding the flap then we will attempt to do this 6/14; patient using Prisma as the primary Khan here. Sarah has completed hyperbarics and did very well. [name] is responded nicely. The depth of this and the condition of the granulation tissue looks Sarah lot better. Sarah has undermining areas at 2 and 10:00 both of these seem to be better as well. Sarah is scheduled for flap closure on 7/11 by Dr. Claudia Desanctis 6/29; the patient continues to use Prisma wet-to-dry. Sarah graft is scheduled for 7/11 by Dr. Claudia Desanctis. We are looking at getting Sarah additional hyperbaric treatment which I think might help with the likelihood of graft survival in this difficult irradiated area 7/21; patient returns to clinic to reinitiate Khan after Sarah surgical procedure by Dr. Claudia Desanctis. On 07/15/2020 Sarah had Khan of left chest Khan and Sarah rotational flap closure. Sarah also had excision of right excess axillary tissue. Pathology did not show any malignancy and follow-up Dr. Claudia Desanctis saw the patient yesterday. He noted Sarah 2 cm area of Khan dehiscence. Recommended Sarah return for hyperbaric oxygen and Sarah returns today for Sarah first treatment. Follow-up with him in 2 weeks. We were not actually going to see the patient today Sarah appointment to be evaluated was Monday however Sarah was concerned about Sarah "fishy odor". Sarah is also noted some drainage. Not specifically Khan in the Khan area but covering with Adaptic 7/25; I am seeing this Khan and follow-up. So far [name] is polymicrobial not growing any specific anything specific nevertheless with the Augmentin and the silver alginate Sarah odor is Sarah lot better.  Not as much periwound tenderness. Dimensions measuring 2.7 x 3.3 x 1.6. Sarah has undermining from 10-1 o'clock of about 1 and half centimeters. Necrotic fat on the surface of this. Electronic Signature(s) Signed: 07/29/2020 12:43:15 PM By: [name] MD Entered By: [name] on 07/29/2020 10:45:44 -------------------------------------------------------------------------------- Physical Exam Details Patient Name: Date of Service: [name] [name] 9:30 [name] Medical Record Number: [id number] Patient Account Number: [id number] Date of Birth/Sex: Treating RN: 1970-09-28 (50 y.o. [name] Primary Care Provider: Other Clinician: [name] Referring Provider: Treating Provider/Extender: [name] in Treatment: 15 Constitutional Sitting or standing Blood Pressure is within target range for patient.. Pulse regular and within target range for patient.Marland Kitchen Respirations regular, non-labored and within target range.. Temperature is normal and within the target range for the patient.Marland Kitchen Appears in no distress. Notes Khan exam; most proximal part of the incision. I think the 3 small opening areas that we saw 4 days ago it morphed into 1. Necrotic fat on the surface debris gently removed with Sarah #5 curette. Sarah has healthy looking tissue nevertheless the Khan has depth of 1.6  cm and undermining of about the same from 10-1 o'clock. Not as much odor noted. There is no specific tenderness Electronic Signature(s) Signed: 07/29/2020 12:43:15 PM By: [name] MD Entered By: [name] on 07/29/2020 10:47:27 -------------------------------------------------------------------------------- Physician Orders Details Patient Name: Date of Service: [name] [name] 9:30 [name] Medical Record Number: [id number] Patient Account Number: [id number] Date of Birth/Sex: Treating RN: [birth year] (49 y.o. 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 - Wednesday 8/10 with Margarita Grizzle Return Sarah Bathing/ Shower/ Hygiene May shower with protection but do not get Khan Khan(s) wet. Khan Treatment Khan #1 - Chest Khan Laterality: Left Cleanser: Khan Cleanser 1 x Per Khan/15 Days Discharge Instructions: Cleanse the Khan with Khan cleanser prior to applying Sarah clean Khan using gauze sponges, not tissue or cotton balls. Prim Khan: Promogran Prisma Matrix, 4.34 (sq in) (silver collagen) 1 x Per Khan/15 Days ary Discharge Instructions: Moisten collagen with saline or hydrogel Secondary Khan: Woven Gauze Sponge, Non-Sterile 4x4 in 1 x Per Khan/15 Days Discharge Instructions: Apply over primary Khan as directed. Secondary Khan: ABD Pad, 8x10 1 x Per Khan/15 Days Discharge Instructions: Apply over primary Khan as directed. Electronic Signature(s) Signed: 07/29/2020 12:43:15 PM By: [name] MD Signed: 07/29/2020 4:44:07 PM By: Levan Hurst RN, BSN Entered By: Levan Hurst on 07/29/2020 10:40:51 -------------------------------------------------------------------------------- Problem List Details Patient Name: Date of Service: Sarah Derby [name] 9:30 [name] Medical Record Number: [id number] Patient Account Number: [id number] Date of Birth/Sex: Treating RN: [birth year] (50 y.o. 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 [name] Primary Care Provider: [name] Other Clinician: Referring Provider: Treating Provider/Extender: [name] in Treatment: 15 Subjective History of Present Illness (HPI) ADMISSION 04/09/2020 [name] who is history of breast cancer in the left breast goes back to 2018. He was found to have [name] in Sarah left breast. Sarah underwent [name] and then subsequent chemo that Sarah did not tolerate very well. Sarah had seed implants placed but then underwent external beam Sarah radiation in the spring Khan. 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 Sarah had [name] 2  variant of uncertain Khan. Likely pathogenic. Sarah adjunctive radiation was from 3/7/Khan through 4/17/Khan. Unfortunately Sarah had [name] on  Khan and it was again positive for an invasive ductal carcinoma grade 3 he is to resume at receptor 80% positive. Sarah-2 negative progesterone negative. Sarah opted for bilateral mastectomies which were done on 01/09/2020 on the right with no evidence of malignancy on the left stage IIb invasive ductal carcinoma. Sarah again received chemotherapy but Sarah did not tolerate it because of Khan dehiscence and low platelet counts according to the patient. Unfortunately Sarah mastectomy Khan dehisced on the left breast. Sarah was taken back to the OR by [name] of general surgery on 33/24/22 and Sarah underwent [name] and subsequent attempt at closure. Sarah had been previously treated with Augmentin for suspected underlying infection. Once again the Khan has dehisced in the mid aspect. It is roughly 1 cm deep there is tunnels both medially and laterally Sarah. Sarah still has Khan in place. Still describes constant pain drainage. Sarah also has [name] still in place that drains about 10 cc/Khan. Currently Sarah is changing ABD pad several times [name] 4/12; patient went back to see [name] who remove most of the stitches and debrided [name]. Sarah has been using silver alginate Khan I did last time which was [name], Enterococcus faecalis and Staphylococcus epidermidis which is of questionable Khan. Sarah has [name] that the Khan have been removed. There are undermining areas superior medial superior lateral and Sarah deep tunneling area in the inferior part of the Khan. There is no obvious purulence. I started Sarah on Augmentin on 04/12/2020 and I will likely extend this for another week AMAZINGLY; Sarah is already been approved for Khan and will start today. Sarah chest x-ray was negative. Echocardiogram which I reviewed from Khan showed [name].. I went over side effects of Khan with the patient and answered any of Sarah questions. Sarah  husband was present 5/3; its been Sarah while since have seen this Khan. Sarah went to see [name] about 2-1/2 weeks ago according to the patient he can remove some of the surrounding debris. Sarah will see him again later this week or early next week. [name] since Sarah has Medicaid which traditionally does not cover Khan care supplies although it traditionally does not cover Khan either [name] actually looks quite Sarah bit better than the last time I saw this better looking surface healthy looking tissue. Sarah has tunnels at 810 2 in 6 the deepest of these is at 2 at 2.2 cm there is no exposed bone. 5/10; the patient was coming in for nurse visit today but requested that I see the Khan because of surface discoloration. Sarah states the Iodoflex makes the surface of this turned "white". Sarah is not really complaining of undue tenderness. Sarah is changing this every second Khan. The goal of this had been to get the surface of the Khan to [name] and then potentially look at changing to Sarah silver collagen-based Khan, Puraply, and then perhaps an alternative skin substituteo Derma vest. The patient tells me Sarah has an appoint with Sarah oncologist [name] on the 19th. Sarah apparently think Sarah is going to get another PET scan. Based on this I would like his comment about whether there is possibility of any malignancy in the Khan itself  or just underneath it. Up to Khan we have not really thought that was true 5/17; nice improvement in the chest Khan. Less depth. We have been using Iodoflex to clean off the Khan surface this seems to have done nicely. Patient went to see [name] 2 days ago who has referred Sarah to see Dr. Claudia Desanctis. I certainly agree with the thought the surface of the Khan looks Sarah lot better Khan and I think consideration of plastic surgery is certainly something the patient should discuss. I have talked to the  patient about this today and I certainly support Sarah conversation with Dr. Claudia Desanctis. I also wondered about is there any residual cancer in this. [name] apparently did not seem to think so per the patient although I do not see that in the text conversations we have had. I also wonder about Sarah skin substitute if Sarah version of Medicare [[employer]] would cover this. [name] is apparently considering starting Sarah on [place]. Patient is already very scared about the side effects but Sarah will discuss this with [name] on 5/19 5/31; patient's Khan about the same in terms of size Sarah still has the tunneling area to the left. Surface of the Khan looks Sarah lot better there has been some filling in the granulation I have been included on serial text between Dr. Callie Fielding surgery] Dr. Ruffin Frederick oncology] and Dr. Para Skeans surgery]. I think there is not Sarah lot of thought that the patient has recurrence of Sarah cancer Sarah MRI was indeterminant but [name] did not feel that there was any reason to think Sarah has Sarah major recurrence and he was not in favor of doing Sarah dedicated breast MRI. Dr. Claudia Desanctis wants to go ahead with Sarah flap closure I did it. I have not reviewed his note but per the patient [name] latissimus dorsi myocutaneous flap or I think Sarah skin flap from Sarah abdomen. I have talked to the patient I think it is time to attempt this. If I can continue hyperbarics surrounding the flap then we will attempt to do this 6/14; patient using Prisma as the primary Khan here. Sarah has completed hyperbarics and did very well. [name] is responded nicely. The depth of this and the condition of the granulation tissue looks Sarah lot better. Sarah has undermining areas at 2 and 10:00 both of these seem to be better as well. Sarah is scheduled for flap closure on 7/11 by Dr. Claudia Desanctis 6/29; the patient continues to use Prisma wet-to-dry. Sarah graft is scheduled for 7/11 by Dr. Claudia Desanctis. We are looking at  getting Sarah additional hyperbaric treatment which I think might help with the likelihood of graft survival in this difficult irradiated area 7/21; patient returns to clinic to reinitiate Khan after Sarah surgical procedure by Dr. Claudia Desanctis. On 07/15/2020 Sarah had Khan of left chest Khan and Sarah rotational flap closure. Sarah also had excision of right excess axillary tissue. Pathology did not show any malignancy and follow-up Dr. Claudia Desanctis saw the patient yesterday. He noted Sarah 2 cm area of Khan dehiscence. Recommended Sarah return for hyperbaric oxygen and Sarah returns today for Sarah first treatment. Follow-up with him in 2 weeks. We were not actually going to see the patient today Sarah appointment to be evaluated was Monday however Sarah was concerned about Sarah "fishy odor". Sarah is also noted some drainage. Not specifically Khan in the Khan area but covering with Adaptic 7/25; I am seeing this Khan and  follow-up. So far [name] is polymicrobial not growing any specific anything specific nevertheless with the Augmentin and the silver alginate Sarah odor is Sarah lot better. Not as much periwound tenderness. Dimensions measuring 2.7 x 3.3 x 1.6. Sarah has undermining from 10-1 o'clock of about 1 and half centimeters. Necrotic fat on the surface of this. Objective Constitutional Sitting or standing Blood Pressure is within target range for patient.. Pulse regular and within target range for patient.Marland Kitchen Respirations regular, non-labored and within target range.. Temperature is normal and within the target range for the patient.Marland Kitchen Appears in no distress. Vitals Time Taken: 10:11 AM, Height: 67 in, Weight: 257 lbs, BMI: 40.2, Temperature: 98.8 F, Pulse: 101 bpm, Respiratory Rate: 18 breaths/min, Blood Pressure: 137/86 mmHg. General Notes: Khan exam; most proximal part of the incision. I think the 3 small opening areas that we saw 4 days ago it morphed into 1. Necrotic fat on the surface debris gently removed with Sarah #5  curette. Sarah has healthy looking tissue nevertheless the Khan has depth of 1.6 cm and undermining of about the same from 10-1 o'clock. Not as much odor noted. There is no specific tenderness Integumentary (Hair, Skin) Khan #1 status is Open. Original cause of Khan was Surgical Injury. The date acquired was: 01/16/2020. The Khan has been in treatment 15 weeks. The Khan is located on the Left Chest. The Khan measures 2.7cm length x 3.3cm width x 1.6cm depth; 6.998cm^2 area and 11.197cm^3 volume. There is Fat Layer (Subcutaneous Tissue) exposed. There is no tunneling noted, however, there is undermining starting at 10:00 and ending at 2:00 with Sarah maximum distance of 1.8cm. There is Sarah large amount of serosanguineous drainage noted. The Khan margin is distinct with the outline attached to the Khan base. There is small (1-33%) pink granulation within the Khan bed. There is Sarah large (67-100%) amount of necrotic tissue within the Khan bed including Adherent Slough. 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 There was Sarah Excisional Skin/Subcutaneous Tissue Khan with Sarah total area of 8.91 sq cm performed by Ricard Dillon., MD. With the following instrument(s): Curette to remove Viable and Non-Viable tissue/material. Material removed includes Fat, Subcutaneous Tissue, and Slough. No specimens were taken. Sarah time out was conducted at 10:34, prior to the start of the procedure. Sarah Minimum amount of bleeding was controlled with Pressure. The procedure was tolerated well with Sarah pain level of 0 throughout and Sarah pain level of 0  following the procedure. Post Khan Measurements: 2.7cm length x 3.3cm width x 1.6cm depth; 11.197cm^3 volume. Character of Khan/Ulcer Post Khan requires further Khan. Post procedure Diagnosis Khan #1: Same as Pre-Procedure Plan Follow-up Appointments: Return Appointment in 2 weeks. - Wednesday 8/10 with Margarita Grizzle Bathing/ Shower/ Hygiene: May shower with protection but do not get Khan Khan(s) wet. Khan #1: - Chest Khan Laterality: Left Cleanser: Khan Cleanser 1 x Per Khan/15 Days Discharge Instructions: Cleanse the Khan with Khan cleanser prior to applying Sarah clean Khan using gauze sponges, not tissue or cotton balls. Prim Khan: Promogran Prisma Matrix, 4.34 (sq in) (silver collagen) 1 x Per Khan/15 Days ary Discharge Instructions: Moisten collagen with saline or hydrogel Secondary Khan: Woven Gauze Sponge,  Non-Sterile 4x4 in 1 x Per Khan/15 Days Discharge Instructions: Apply over primary Khan as directed. Secondary Khan: ABD Pad, 8x10 1 x Per Khan/15 Days Discharge Instructions: Apply over primary Khan as directed. 1. I change the primary Khan to moistened Prisma 2. Reach out to Dr. Claudia Desanctis who sees Sarah on August 5 whether he will consider Sarah second attempt to surgically close this. 3. Although the Khan so far is polymicrobial the Khan certainly has much less of an offensive odor 4 If Dr. Claudia Desanctis does not feel or cannot actually do Sarah second surgery on this, I might consider puraply Electronic Signature(s) Signed: 07/29/2020 12:43:15 PM By: [name] MD Entered By: [name] on 07/29/2020 10:49:43 -------------------------------------------------------------------------------- SuperBill Details Patient Name: Date of Service: Sarah [place] [name] Medical Record Number: [id number] Patient Account Number: [id number] Date of Birth/Sex: Treating RN: February 13, 1970 (50 y.o. 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 Facility Procedures CPT4 Code: 34356861 Description: 68372 - DEB SUBQ TISSUE 20 SQ CM/< ICD-10 Diagnosis Description T81.31XD Disruption of external operation (surgical) Khan, not elsewhere classified, subs Modifier: equent encounter Quantity: 1 Physician Procedures : CPT4 Code Description Modifier [id number] 52080 - WC PHYS SUBQ TISS 20 SQ CM ICD-10 Diagnosis Description T81.31XD Disruption of external operation (surgical) Khan, not elsewhere classified, subsequent encounter Quantity: 1 Electronic Signature(s) Signed: 07/29/2020 12:43:15 PM By: [name] MD Entered By: [name] on 07/29/2020 10:50:13

## 2020-07-29 NOTE — Progress Notes (Addendum)
NAVI, POLIS ([id number]) Visit Report for 07/29/2020 HBO Details Patient Name: Date of Service: [name] [name] 07/29/2020 10:15 [name] Medical Record Number: [id number] Patient Account Number: [id number] Date of Birth/Sex: Treating RN: [birth year] (50 y.o. 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 HBO Treatment Details Treatment Number: Treatment Aborted/Not Restarted: Patient Choice Patient Type: Outpatient Chamber Type: Monoplace Chamber Serial #: [id number] Treatment Protocol: 2.5 ATA with 90 minutes oxygen, with two 5 minute air breaks Treatment Details Compression Rate Down: 1.0 psi / minute De-Compression Rate Up: Sarah breaks and ir breathing Compress Tx Pressure Decompress Decompress periods Begins Reached Begins Ends (leave unused spaces blank) Chamber Pressure (ATA 1 2.5 2.5 2.5 2.5 2.5 - - 2.5 1 ) Clock Time (24 hr) 11:03 - - - - - --- 11:05 Treatment Length: 2 (minutes) Treatment Segments: 0 Vital Signs Capillary Blood Glucose Reference Range: 80 - 120 mg / dl HBO Diabetic Blood Glucose Intervention Range: <131 mg/dl or >249 mg/dl Time Vitals Blood Respiratory Capillary Blood Glucose Pulse Action Type: Pulse: Temperature: Taken: Pressure: Rate: Glucose (mg/dl): Meter #: Oximetry (%) Taken: Pre 09:52 146/86 117 20 98.1 Treatment Response Treatment Toleration: Well Adverse Events: 1:Confinement Anxiety Treatment Completion Status: Treatment Aborted/Not Restarted Reason: Patient Choice [name] Notes Placed patient in HBO Chamber and she experienced symptoms that she recognized of  anxiety. Patient chose not to continue with treatment today. She stated that debridement and other challenges prevented her from proper medication to combat confinement anxiety today. Patient will return tomorrow for treatment. Physician HBO Attestation: I certify that I supervised this HBO treatment in accordance with Medicare guidelines. Sarah trained emergency response team is readily available per Yes hospital policies and procedures. Continue HBOT as ordered. Yes Electronic Signature(s) Signed: 07/29/2020 2:03:17 PM By: Sarah Shan DO Previous Signature: 07/29/2020 12:08:18 PM Version By: [name] EMT Previous Signature: 07/29/2020 12:07:12 PM Version By: [name] EMT Entered By: [name] on 07/29/2020 14:02:57 -------------------------------------------------------------------------------- HBO Safety Checklist Details Patient Name: Date of Service: [name] [name] 07/29/2020 10:15 [name] Medical Record Number: [id number] Patient Account Number: [id number] Date of Birth/Sex: Treating RN: 08-13-1970 (50 y.o. [name] Primary Care Tondalaya Perren: [name] Other Clinician: Donavan Khan Referring [name]: Treating [name]/Extender: [name] in Treatment: 15 HBO Safety Checklist Items Safety Checklist Consent Form Signed Patient voided / foley secured and emptied When did you last eato n/Sarah Last dose of injectable or oral agent n/Sarah Ostomy pouch emptied and vented if applicable NA All implantable devices assessed, documented and approved NA Intravenous access site secured and place NA Valuables secured Linens and cotton and cotton/polyester blend (less than 51% polyester) Personal oil-based products / skin lotions / body lotions removed Wigs or hairpieces removed NA Smoking or tobacco materials removed NA Books / newspapers / magazines / loose paper removed Cologne, aftershave, perfume and  deodorant removed Jewelry removed (may wrap wedding band) Make-up removed Hair care products removed Battery operated devices (external) removed Heating patches and chemical warmers removed Titanium eyewear removed NA Nail polish cured greater than 10 hours Casting material cured greater than 10 hours NA Hearing aids removed NA Loose dentures or partials removed  Prosthetics have been removed NA Patient demonstrates correct use of air break device (if applicable) Patient concerns have been addressed Patient grounding bracelet on and cord attached to chamber Specifics for Inpatients (complete in addition to above) Medication sheet sent with patient NA Intravenous medications needed or due during therapy sent with patient NA Drainage tubes (e.g. nasogastric tube or chest tube secured and vented) NA Endotracheal or Tracheotomy tube secured NA Cuff deflated of air and inflated with saline NA Airway suctioned NA Electronic Signature(s) Signed: 07/29/2020 12:03:26 PM By: [name] EMT Entered By: [name] on 07/29/2020 12:03:26

## 2020-07-30 ENCOUNTER — Encounter (HOSPITAL_BASED_OUTPATIENT_CLINIC_OR_DEPARTMENT_OTHER): Payer: Medicaid Other | Admitting: Internal Medicine

## 2020-07-30 ENCOUNTER — Other Ambulatory Visit (HOSPITAL_COMMUNITY): Payer: Self-pay

## 2020-07-30 ENCOUNTER — Telehealth: Payer: Self-pay | Admitting: Plastic Surgery

## 2020-07-30 DIAGNOSIS — T8131XD Disruption of external operation (surgical) wound, not elsewhere classified, subsequent encounter: Secondary | ICD-10-CM | POA: Diagnosis not present

## 2020-07-30 NOTE — Progress Notes (Signed)
[name], [name] ([id number]) Visit Report for 07/29/2020 Arrival Information Details Patient Name: Date of Service: [name] [name] 07/29/2020 9:30 Sarah M Medical Record Number: [id number] Patient Account Number: [id number] Date of Birth/Sex: Treating RN: March 29, 1970 (50 y.o. [name], [name] Primary Care [name]: [name] Other Clinician: Referring [name]: Treating Taisa Deloria/Extender: [name] in Treatment: 15 Visit Information History Since Last Visit Added or deleted any medications: No Patient Arrived: Ambulatory Any new allergies or adverse reactions: No Arrival Time: 10:10 Had Sarah fall or experienced change in No Accompanied By: self activities of daily living that may affect Transfer Assistance: None risk of falls: Patient Identification Verified: Yes Signs or symptoms of abuse/neglect since last visito No Secondary Verification Process Completed: Yes Hospitalized since last visit: No Patient Requires Transmission-Based Precautions: No Implantable device outside of the clinic excluding No Patient Has Alerts: No cellular tissue based products placed in the center since last visit: Has Dressing in Place as Prescribed: Yes Pain Present Now: Yes Electronic Signature(s) Signed: 07/30/2020 10:27:28 AM By: Sandre Kitty Entered By: Sandre Kitty on 07/29/2020 10:10:59 -------------------------------------------------------------------------------- Encounter Discharge Information Details Patient Name: Date of Service: [name] V [name] 07/29/2020 9:30 Sarah M Medical Record Number: [id number] Patient Account Number: [id number] Date of Birth/Sex: Treating RN: 05-Nov-1970 (50 y.o. [name] Primary Care Nikolis Berent: [name] Other Clinician: Referring Jacinda Kanady: Treating Ahmon Tosi/Extender: [name] in Treatment: 15 Encounter Discharge Information Items Post Procedure Vitals Discharge  Condition: Stable Temperature (F): 98.8 Ambulatory Status: Ambulatory Pulse (bpm): 101 Discharge Destination: Home Respiratory Rate (breaths/min): 18 Transportation: Private Auto Blood Pressure (mmHg): 137/86 Accompanied By: alone Schedule Follow-up Appointment: Yes Clinical Summary of Care: Patient Declined Electronic Signature(s) Signed: 07/29/2020 4:44:07 PM By: Levan Hurst RN, BSN Entered By: Levan Hurst on 07/29/2020 13:37:02 -------------------------------------------------------------------------------- Multi Wound Chart Details Patient Name: Date of Service: [name] V [name] 07/29/2020 9:30 Sarah M Medical Record Number: [id number] Patient Account Number: [id number] Date of Birth/Sex: Treating RN: November 06, 1970 (50 y.o. [name] Primary Care Cariah Salatino: [name] Other Clinician: Referring Meliana Canner: Treating Erique Kaser/Extender: [name] in Treatment: 15 Vital Signs Height(in): 92 Pulse(bpm): 101 Weight(lbs): 825 Blood Pressure(mmHg): 137/86 Body Mass Index(BMI): 40 Temperature(F): 98.8 Respiratory Rate(breaths/min): 18 Photos: [N/Sarah:N/Sarah] Left Chest N/Sarah N/Sarah Wound Location: Surgical Injury N/Sarah N/Sarah Wounding Event: Open Surgical Wound N/Sarah N/Sarah Primary Etiology: Necrosis (Radiation) N/Sarah N/Sarah Secondary Etiology: Hypertension, Osteoarthritis, ReceivedN/Sarah N/Sarah Comorbid History: Chemotherapy, Received Radiation 01/16/2020 N/Sarah N/Sarah Date Acquired: 15 N/Sarah N/Sarah Weeks of Treatment: Open N/Sarah N/Sarah Wound Status: 2.7x3.3x1.6 N/Sarah N/Sarah Measurements L x W x D (cm) 6.998 N/Sarah N/Sarah Sarah (cm) : rea 11.197 N/Sarah N/Sarah Volume (cm) : -10.00% N/Sarah N/Sarah % Reduction in Sarah rea: -60.00% N/Sarah N/Sarah % Reduction in Volume: 10 Starting Position 1 (o'clock): 2 Ending Position 1 (o'clock): 1.8 Maximum Distance 1 (cm): Yes N/Sarah N/Sarah Undermining: Full Thickness Without Exposed N/Sarah N/Sarah Classification: Support Structures Large N/Sarah N/Sarah Exudate Sarah  mount: Serosanguineous N/Sarah N/Sarah Exudate Type: red, brown N/Sarah N/Sarah Exudate Color: Distinct, outline attached N/Sarah N/Sarah Wound Margin: Small (1-33%) N/Sarah N/Sarah Granulation Sarah mount: Pink N/Sarah N/Sarah Granulation Quality: Large (67-100%) N/Sarah N/Sarah Necrotic Sarah mount: Fat Layer (Subcutaneous Tissue): Yes N/Sarah N/Sarah Exposed Structures: Fascia: No Tendon: No Muscle: No Joint: No Bone: No Small (1-33%) N/Sarah N/Sarah Epithelialization: Debridement - Excisional N/Sarah N/Sarah Debridement: Pre-procedure Verification/Time Out 10:34 N/Sarah N/Sarah Taken: Fat, Subcutaneous, [employer]  N/Sarah N/Sarah Tissue Debrided: Skin/Subcutaneous Tissue N/Sarah N/Sarah Level: 8.91 N/Sarah N/Sarah Debridement Sarah (sq cm): rea Curette N/Sarah N/Sarah Instrument: Minimum N/Sarah N/Sarah Bleeding: Pressure N/Sarah N/Sarah Hemostasis Sarah chieved: 0 N/Sarah N/Sarah Procedural Pain: 0 N/Sarah N/Sarah Post Procedural Pain: Procedure was tolerated well N/Sarah N/Sarah Debridement Treatment Response: 2.7x3.3x1.6 N/Sarah N/Sarah Post Debridement Measurements L x W x D (cm) 11.197 N/Sarah N/Sarah Post Debridement Volume: (cm) Debridement N/Sarah N/Sarah Procedures Performed: Treatment Notes Electronic Signature(s) Signed: 07/29/2020 12:43:15 PM By: [name] MD Signed: 07/29/2020 4:44:07 PM By: Levan Hurst RN, BSN Entered By: [name] on 07/29/2020 10:44:24 -------------------------------------------------------------------------------- Multi-Disciplinary Care Plan Details Patient Name: Date of Service: [name] V [name] 07/29/2020 9:30 Sarah M Medical Record Number: [id number] Patient Account Number: [id number] Date of Birth/Sex: Treating RN: 12-Mar-1970 (50 y.o. [name] Primary Care  Antolin: [name] Other Clinician: Referring Brendi Mccarroll: Treating Arthella Headings/Extender: [name] in Treatment: 15 Active Inactive Wound/Skin Impairment Nursing Diagnoses: Impaired tissue integrity Knowledge deficit related to smoking impact on wound healing Knowledge deficit related  to ulceration/compromised skin integrity Goals: Patient will demonstrate Sarah reduced rate of smoking or cessation of smoking Date Initiated: 04/09/2020 Date Inactivated: 06/18/2020 Target Resolution Date: 06/28/2020 Goal Status: Met Patient will have Sarah decrease in wound volume by X% from date: (specify in notes) Date Initiated: 04/09/2020 Date Inactivated: 07/03/2020 Target Resolution Date: 07/05/2020 Goal Status: Met Patient/caregiver will verbalize understanding of skin care regimen Date Initiated: 04/09/2020 Target Resolution Date: 08/30/2020 Goal Status: Active Ulcer/skin breakdown will have Sarah volume reduction of 30% by week 4 Date Initiated: 04/09/2020 Date Inactivated: 05/14/2020 Target Resolution Date: 05/11/2020 Unmet Reason: see wound Goal Status: Unmet meaurements. Interventions: Assess patient/caregiver ability to obtain necessary supplies Assess patient/caregiver ability to perform ulcer/skin care regimen upon admission and as needed Assess ulceration(s) every visit Provide education on smoking Notes: Electronic Signature(s) Signed: 07/29/2020 4:44:07 PM By: Levan Hurst RN, BSN Entered By: Levan Hurst on 07/29/2020 13:35:51 -------------------------------------------------------------------------------- Pain Assessment Details Patient Name: Date of Service: [name] V [name] 07/29/2020 9:30 Sarah M Medical Record Number: [id number] Patient Account Number: [id number] Date of Birth/Sex: Treating RN: [birth year] (50 y.o. [name] Primary Care Kline Bulthuis: [name] Other Clinician: Referring Tito Ausmus: Treating Keeghan Mcintire/Extender: [name] in Treatment: 15 Active Problems Location of Pain Severity and Description of Pain Patient Has Paino Yes Site Locations Rate the pain. Current Pain Level: 5 Pain Management and Medication Current Pain Management: Electronic Signature(s) Signed: 07/29/2020 4:44:07 PM By: Levan Hurst RN,  BSN Signed: 07/30/2020 10:27:28 AM By: Sandre Kitty Entered By: Sandre Kitty on 07/29/2020 10:11:26 -------------------------------------------------------------------------------- Patient/Caregiver Education Details Patient Name: Date of Service: [name] 7/25/2022andnbsp9:30 Sarah M Medical Record Number: [id number] Patient Account Number: [id number] Date of Birth/Gender: Treating RN: [birth year] (50 y.o. [name] Primary Care Physician: [name] Other Clinician: Referring Physician: Treating Physician/Extender: [name] in Treatment: 15 Education Assessment Education Provided To: Patient Education Topics Provided Wound/Skin Impairment: Methods: Explain/Verbal Responses: State content correctly [employer]) Signed: 07/29/2020 4:44:07 PM By: Levan Hurst RN, BSN Entered By: Levan Hurst on 07/29/2020 13:36:03 -------------------------------------------------------------------------------- Wound Assessment Details Patient Name: Date of Service: [name] 07/29/2020 9:30 Sarah M Medical Record Number: [id number] Patient Account Number: [id number] Date of Birth/Sex: Treating RN: April 07, 1970 (50 y.o. [name] Primary Care Chasiti Waddington: [name] Other Clinician: Referring Eulala Newcombe: Treating Nataliya Graig/Extender: [name]  [name] Weeks in Treatment: 15 Wound Status Wound Number: 1 Primary Open Surgical Wound Etiology: Wound Location: Left Chest Secondary Necrosis (Radiation) Wounding Event: Surgical Injury Etiology: Date Acquired: 01/16/2020 Wound Status: Open Weeks Of Treatment: 15 Comorbid Hypertension, Osteoarthritis, Received Chemotherapy, Clustered Wound: No History: Received Radiation Photos Wound Measurements Length: (cm) 2.7 Width: (cm) 3.3 Depth: (cm) 1.6 Area: (cm) 6.998 Volume: (cm) 11.197 % Reduction in Area: -10% % Reduction in Volume:  -60% Epithelialization: Small (1-33%) Tunneling: No Undermining: Yes Starting Position (o'clock): 10 Ending Position (o'clock): 2 Maximum Distance: (cm) 1.8 Wound Description Classification: Full Thickness Without Exposed Support Structures Wound Margin: Distinct, outline attached Exudate Amount: Large Exudate Type: Serosanguineous Exudate Color: red, brown Foul Odor After Cleansing: No Slough/Fibrino Yes Wound Bed Granulation Amount: Small (1-33%) Exposed Structure Granulation Quality: Pink Fascia Exposed: No Necrotic Amount: Large (67-100%) Fat Layer (Subcutaneous Tissue) Exposed: Yes Necrotic Quality: Adherent Slough Tendon Exposed: No Muscle Exposed: No Joint Exposed: No Bone Exposed: No Treatment Notes Wound #1 (Chest) Wound Laterality: Left Cleanser Wound Cleanser Discharge Instruction: Cleanse the wound with wound cleanser prior to applying Sarah clean dressing using gauze sponges, not tissue or cotton balls. Peri-Wound Care Topical Primary Dressing Promogran Prisma Matrix, 4.34 (sq in) (silver collagen) Discharge Instruction: Moisten collagen with saline or hydrogel Secondary Dressing Woven Gauze Sponge, Non-Sterile 4x4 in Discharge Instruction: Apply over primary dressing as directed. ABD Pad, 8x10 Discharge Instruction: Apply over primary dressing as directed. Secured With Compression Wrap Compression Stockings Environmental education officer) Signed: 07/29/2020 4:44:07 PM By: Levan Hurst RN, BSN Entered By: Levan Hurst on 07/29/2020 10:31:26 -------------------------------------------------------------------------------- Vitals Details Patient Name: Date of Service: [name] V [name] 07/29/2020 9:30 Sarah M Medical Record Number: [id number] Patient Account Number: [id number] Date of Birth/Sex: Treating RN: August 24, 1970 (50 y.o. [name] Primary Care Jamesetta Greenhalgh: [name] Other Clinician: Referring Reinhardt Licausi: Treating Keenan Dimitrov/Extender:  [name] in Treatment: 15 Vital Signs Time Taken: 10:11 Temperature (F): 98.8 Height (in): 67 Pulse (bpm): 101 Weight (lbs): 257 Respiratory Rate (breaths/min): 18 Body Mass Index (BMI): 40.2 Blood Pressure (mmHg): 137/86 Reference Range: 80 - 120 mg / dl Electronic Signature(s) Signed: 07/30/2020 10:27:28 AM By: Sandre Kitty Entered By: Sandre Kitty on 07/29/2020 10:11:15

## 2020-07-30 NOTE — Progress Notes (Addendum)
[name], [name] ([id number]) Visit Report for 07/30/2020 Arrival Information Details Patient Name: Date of Service: [name] [name] 07/30/2020 10:00 Sarah M Medical Record Number: [id number] Patient Account Number: [id number] Date of Birth/Sex: Treating RN: 05-25-1970 (50 y.o. [name], [name] Primary Care France Noyce: [name] Other Clinician: [name] Referring Adalid Beckmann: Treating Tristy Udovich/Extender: [name] in Treatment: 69 Visit Information History Since Last Visit All ordered tests and consults were completed: Yes Patient Arrived: Ambulatory Added or deleted any medications: No Arrival Time: 09:20 Any new allergies or adverse reactions: No Accompanied By: self Had Sarah fall or experienced change in No Transfer Assistance: None activities of daily living that may affect Patient Identification Verified: Yes risk of falls: Secondary Verification Process Completed: Yes Signs or symptoms of abuse/neglect since last visito No Patient Requires Transmission-Based Precautions: No Hospitalized since last visit: No Patient Has Alerts: No Implantable device outside of the clinic excluding No cellular tissue based products placed in the center since last visit: Pain Present Now: No Electronic Signature(s) Signed: 07/30/2020 12:14:34 PM By: [name] EMT Entered By: [name] on 07/30/2020 12:14:34 -------------------------------------------------------------------------------- Encounter Discharge Information Details Patient Name: Date of Service: [name] [name] 07/30/2020 10:00 Sarah M Medical Record Number: [id number] Patient Account Number: [id number] Date of Birth/Sex: Treating RN: 21-Mar-1970 (50 y.o. [name], [name] Primary Care Candance Bohlman: [name] Other Clinician: [name] Referring Mela Perham: Treating Treyveon Mochizuki/Extender: [name] in Treatment: 16 Encounter  Discharge Information Items Discharge Condition: Stable Ambulatory Status: Ambulatory Discharge Destination: Home Transportation: Private Auto Accompanied By: daughter Schedule Follow-up Appointment: No Clinical Summary of Care: Electronic Signature(s) Signed: 07/30/2020 12:19:07 PM By: [name] EMT Entered By: [name] on 07/30/2020 12:19:07 -------------------------------------------------------------------------------- Vitals Details Patient Name: Date of Service: [name] [name] 07/30/2020 10:00 Sarah M Medical Record Number: [id number] Patient Account Number: [id number] Date of Birth/Sex: Treating RN: Jan 20, 1970 (50 y.o. [name], [name] Primary Care Shronda Boeh: [name] Other Clinician: [name] Referring Rennie Hack: Treating Audi Conover/Extender: [name] in Treatment: 16 Vital Signs Time Taken: 09:34 Temperature (F): 98 Height (in): 67 Pulse (bpm): 109 Weight (lbs): 257 Respiratory Rate (breaths/min): 20 Body Mass Index (BMI): 40.2 Blood Pressure (mmHg): 121/72 Reference Range: 80 - 120 mg / dl Electronic Signature(s) Signed: 07/30/2020 12:15:06 PM By: [name] EMT Entered By: [name] on 07/30/2020 12:15:06

## 2020-07-30 NOTE — Telephone Encounter (Signed)
Patient's husband called to advise that Dr. Dellia Nims recommended she go back to surgery to close the wound and he wanted to update Dr. Claudia Desanctis and see if he is willing to take her back to surgery. Did advise patient has a follow up next week but will have someone follow up with him regarding this.

## 2020-07-30 NOTE — Progress Notes (Addendum)
[name], [name] ([id number]) Visit Report for 07/29/2020 Arrival Information Details Patient Name: Date of Service: Sarah [name] [name] 07/29/2020 10:15 Sarah M Medical Record Number: [id number] Patient Account Number: [id number] Date of Birth/Sex: Treating RN: 1970/04/07 (50 y.o. [name] Primary Care Charlynn Salih: [name] Other Clinician: [name] Referring Muhannad Bignell: Treating Keiandra Sullenger/Extender: [name] in Treatment: 15 Visit Information History Since Last Visit All ordered tests and consults were completed: Yes Patient Arrived: Ambulatory Added or deleted any medications: No Arrival Time: 09:45 Any new allergies or adverse reactions: No Accompanied By: self Had Sarah fall or experienced change in No Transfer Assistance: None activities of daily living that may affect Patient Identification Verified: Yes risk of falls: Secondary Verification Process Completed: Yes Signs or symptoms of abuse/neglect since last visito No Patient Requires Transmission-Based Precautions: No Hospitalized since last visit: No Patient Has Alerts: No Implantable device outside of the clinic excluding No cellular tissue based products placed in the center since last visit: Pain Present Now: No Electronic Signature(s) Signed: 07/29/2020 12:01:14 PM By: [name] EMT Entered By: [name] on 07/29/2020 12:01:14 -------------------------------------------------------------------------------- Encounter Discharge Information Details Patient Name: Date of Service: [name] [name] 07/29/2020 10:15 Sarah M Medical Record Number: [id number] Patient Account Number: [id number] Date of Birth/Sex: Treating RN: March 21, 1970 (50 y.o. [name] Primary Care Samie Barclift: [name] Other Clinician: [name] Referring Chrishawna Farina: Treating Lititia Sen/Extender: [name] in Treatment: 15 Encounter  Discharge Information Items Discharge Condition: Stable Ambulatory Status: Ambulatory Discharge Destination: Home Transportation: Private Auto Accompanied By: self Schedule Follow-up Appointment: No Clinical Summary of Care: Electronic Signature(s) Signed: 10/03/2020 1:28:07 PM By: [name] EMT Entered By: [name] on 08/05/2020 11:06:52 -------------------------------------------------------------------------------- Vitals Details Patient Name: Date of Service: [name] [name] 07/29/2020 10:15 Sarah M Medical Record Number: [id number] Patient Account Number: [id number] Date of Birth/Sex: Treating RN: [birth year] (50 y.o. [name] Primary Care Morghan Kester: [name] Other Clinician: [name] Referring Jusitn Salsgiver: Treating Alayha Babineaux/Extender: [name] in Treatment: 15 Vital Signs Time Taken: 09:52 Temperature (F): 98.1 Height (in): 67 Pulse (bpm): 117 Weight (lbs): 257 Respiratory Rate (breaths/min): 20 Body Mass Index (BMI): 40.2 Blood Pressure (mmHg): 146/86 Reference Range: 80 - 120 mg / dl Electronic Signature(s) Signed: 07/29/2020 12:02:04 PM By: [name] EMT Entered By: [name] on 07/29/2020 12:02:04

## 2020-07-30 NOTE — Progress Notes (Addendum)
BLANCHIE, CROWE ([id number]) Visit Report for 07/30/2020 HBO Details Patient Name: Date of Service: [name] [name] 07/30/2020 10:00 Sarah M Medical Record Number: [id number] Patient Account Number: [id number] Date of Birth/Sex: Treating RN: [birth year] (50 y.o. 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 HBO Treatment Details Treatment Number: 3 Patient Type: Outpatient Chamber Type: Monoplace Chamber Serial #: [id number] Treatment Protocol: 2.5 ATA with 90 minutes oxygen, with two 5 minute air breaks Treatment Details Compression Rate Down: 2.0 psi / minute De-Compression Rate Up: 2.0 psi / minute Sarah breaks and breathing ir Compress Tx Pressure periods Decompress Decompress Begins Reached (leave unused spaces Begins Ends blank) Chamber Pressure (ATA 1 2.5 2.5 2.5 2.5 2.5 - - 2.5 1 ) Clock Time (24 hr) 09:37 09:48 10:18 10:23 10:53 10:58 - - 11:28 11:39 Treatment Length: 122 (minutes) Treatment Segments: 4 Vital Signs Capillary Blood Glucose Reference Range: 80 - 120 mg / dl HBO Diabetic Blood Glucose Intervention Range: <131 mg/dl or >249 mg/dl Time Vitals Blood Respiratory Capillary Blood Glucose Pulse Action Type: Pulse: Temperature: Taken: Pressure: Rate: Glucose (mg/dl): Meter #: Oximetry (%) Taken: Pre 09:34 121/72 109 20 98 Post 11:47 121/72 79 18 97.9 Treatment Response Treatment Toleration: Well Treatment Completion Status: Treatment Completed without Adverse Event Renatta Shrieves Notes No concerns with treatment given Physician HBO Attestation: I certify that I supervised this HBO treatment in  accordance with Medicare guidelines. Sarah trained emergency response team is readily available per Yes hospital policies and procedures. Continue HBOT as ordered. Yes Electronic Signature(s) Signed: 07/30/2020 5:43:33 PM By: [name] MD Previous Signature: 07/30/2020 12:18:06 PM Version By: [name] EMT Entered By: [name] on 07/30/2020 17:41:40 -------------------------------------------------------------------------------- HBO Safety Checklist Details Patient Name: Date of Service: [name] [name] 07/30/2020 10:00 Sarah M Medical Record Number: [id number] Patient Account Number: [id number] Date of Birth/Sex: Treating RN: Jan 01, 1971 (50 y.o. [name], [name] Primary Care Boone Gear: [name] Other Clinician: [name] Referring Tashima Scarpulla: Treating Aliceson Dolbow/Extender: [name] in Treatment: 16 HBO Safety Checklist Items Safety Checklist Consent Form Signed Patient voided / foley secured and emptied When did you last eato 0915 Last dose of injectable or oral agent n/Sarah Ostomy pouch emptied and vented if applicable NA All implantable devices assessed, documented and approved NA Intravenous access site secured and place NA Valuables secured Linens and cotton and cotton/polyester blend (less than 51% polyester) Personal oil-based products / skin lotions / body lotions removed Wigs or hairpieces removed NA Smoking or tobacco materials removed NA Books / newspapers / magazines / loose paper removed Cologne, aftershave, perfume and deodorant removed Jewelry removed (may wrap wedding band) Make-up removed Hair care products removed Battery operated devices (external) removed Heating patches and chemical warmers removed Titanium eyewear removed NA Nail polish cured greater than 10 hours Casting material cured greater than 10 hours NA Hearing aids removed NA Loose dentures or partials removed Prosthetics  have been removed NA Patient demonstrates correct use of air break device (if applicable) Patient concerns have been addressed Patient grounding bracelet on and cord attached to chamber Specifics for Inpatients (complete in addition to above) Medication sheet sent with patient NA Intravenous medications needed or  due during therapy sent with patient NA Drainage tubes (e.g. nasogastric tube or chest tube secured and vented) NA Endotracheal or Tracheotomy tube secured NA Cuff deflated of air and inflated with saline NA Airway suctioned NA Electronic Signature(s) Signed: 07/30/2020 12:16:22 PM By: [name] EMT Entered By: [name] on 07/30/2020 12:16:22

## 2020-07-30 NOTE — Progress Notes (Signed)
[name], [name] ([id number]) Visit Report for 07/30/2020 SuperBill Details Patient Name: Date of Service: [name] 07/30/2020 Medical Record Number: [id number] Patient Account Number: [id number] Date of Birth/Sex: Treating RN: May 04, 1970 (50 y.o. 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 Facility Procedures CPT4 Code Description Modifier Quantity [id number] G0277-(Facility Use Only) HBOT full body chamber, 51mn , 4 ICD-10 Diagnosis Description T81.31XD Disruption of external operation (surgical) wound, not elsewhere classified, subsequent encounter 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 Physician Procedures Quantity CPT4 Code Description Modifier [id number] PHYS HYPERBARIC OXYGEN THERAPY 1 ICD-10 Diagnosis Description T81.31XD Disruption of external operation (surgical) wound, not elsewhere classified, subsequent encounter 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

## 2020-07-31 ENCOUNTER — Other Ambulatory Visit: Payer: Self-pay

## 2020-07-31 ENCOUNTER — Encounter (HOSPITAL_BASED_OUTPATIENT_CLINIC_OR_DEPARTMENT_OTHER): Payer: Medicaid Other | Admitting: Physician Assistant

## 2020-07-31 DIAGNOSIS — T8131XD Disruption of external operation (surgical) wound, not elsewhere classified, subsequent encounter: Secondary | ICD-10-CM | POA: Diagnosis not present

## 2020-07-31 NOTE — Progress Notes (Signed)
[name], [name] ([id number]) Visit Report for 07/31/2020 Problem List Details Patient Name: Date of Service: Sarah BDELA [name] 07/31/2020 10:00 Sarah M Medical Record Number: [id number] Patient Account Number: [id number] Date of Birth/Sex: Treating RN: Sep 16, 1970 (50 y.o. 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 07/31/2020 Medical Record Number: [id number] Patient Account Number: [id number] Date of Birth/Sex: Treating RN: Jul 16, 1970 (50 y.o. 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 unspecified  breast and nipple Facility Procedures CPT4 Code: [id number] Description: G0277-(Facility Use Only) HBOT full body chamber, 78mn , ICD-10 Diagnosis Description T81.31XD Disruption of external operation (surgical) wound, not elsewhere classified, subse 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 and subcutaneous  tissue 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 Modifier: quent encounter Quantity: 4 Physician Procedures : CPT4 Code Description Modifier [id number] PHYS HYPERBARIC OXYGEN THERAPY ICD-10 Diagnosis Description T81.31XD Disruption of external operation (surgical) wound, not elsewhere classified, subsequent encounter L59.8 Other specified disorders of  the skin and subcutaneous tissue 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

## 2020-07-31 NOTE — Progress Notes (Signed)
[name], [name] ([id number]) Visit Report for 07/31/2020 HBO Details Patient Name: Date of Service: Sarah [name] [name] 07/31/2020 10:00 Sarah M Medical Record Number: [id number] Patient Account Number: [id number] Date of Birth/Sex: Treating RN: [birth year] (50 y.o. 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 HBO Treatment Details Treatment Number: 4 Patient Type: Outpatient Chamber Type: Monoplace Chamber Serial #: [id number] Treatment Protocol: 2.5 ATA with 90 minutes oxygen, with two 5 minute air breaks Treatment Details Compression Rate Down: 2.0 psi / minute De-Compression Rate Up: Sarah breaks and breathing ir Compress Tx Pressure periods Decompress Decompress Begins Reached (leave unused spaces Begins Ends blank) Chamber Pressure (ATA 1 2.5 2.5 2.5 2.5 2.5 - - 2.5 1 ) Clock Time (24 hr) 09:40 09:53 10:23 10:28 10:58 11:03 - - 11:33 11:45 Treatment Length: 125 (minutes) Treatment Segments: 4 Vital Signs Capillary Blood Glucose Reference Range: 80 - 120 mg / dl HBO Diabetic Blood Glucose Intervention Range: <131 mg/dl or >249 mg/dl Time Vitals Blood Respiratory Capillary Blood Glucose Pulse Action Type: Pulse: Temperature: Taken: Pressure: Rate: Glucose (mg/dl): Meter #: Oximetry (%) Taken: Pre 09:33 142/90 104 20 98.1 Post 11:47 114/76 85 18 97.9 Treatment Response Treatment Toleration: Well Treatment Completion Status: Treatment Completed without Adverse Event Physician HBO Attestation: I certify that I supervised this HBO treatment in accordance with Medicare guidelines. Sarah trained emergency response  team is readily available per Yes hospital policies and procedures. Continue HBOT as ordered. Yes Electronic Signature(s) Signed: 07/31/2020 4:30:14 PM By: Worthy Keeler PA-C Previous Signature: 07/31/2020 12:10:20 PM Version By: [name] EMT Entered By: Worthy Keeler on 07/31/2020 16:30:13 -------------------------------------------------------------------------------- HBO Safety Checklist Details Patient Name: Date of Service: Sarah [name] 07/31/2020 10:00 Sarah M Medical Record Number: [id number] Patient Account Number: [id number] Date of Birth/Sex: Treating RN: 15-May-1970 (50 y.o. [name] Primary Care Tashyra Adduci: [name] Other Clinician: [name] Referring Deija Buhrman: Treating Kataleyah Carducci/Extender: Randon Goldsmith in Treatment: 16 HBO Safety Checklist Items Safety Checklist Consent Form Signed Patient voided / foley secured and emptied When did you last eato snack 0900 Last dose of injectable or oral agent n/Sarah Ostomy pouch emptied and vented if applicable NA All implantable devices assessed, documented and approved NA Intravenous access site secured and place NA Valuables secured Linens and cotton and cotton/polyester blend (less than 51% polyester) Personal oil-based products / skin lotions / body lotions removed Wigs or hairpieces removed NA Smoking or tobacco materials removed NA Books / newspapers / magazines / loose paper removed Cologne, aftershave, perfume and deodorant removed Jewelry removed (may wrap wedding band) Make-up removed Hair care products removed Battery operated devices (external) removed Heating patches and chemical warmers removed Titanium eyewear removed NA Nail polish cured greater than 10 hours Casting material cured greater than 10 hours NA Hearing aids removed Loose dentures or partials removed Prosthetics have been removed NA Patient demonstrates correct use of air  break device (if applicable) Patient concerns have been addressed Patient grounding bracelet on and cord attached to chamber Specifics for Inpatients (complete in addition to above) Medication sheet sent with patient NA Intravenous medications needed or due during therapy sent with patient NA  Drainage tubes (e.g. nasogastric tube or chest tube secured and vented) NA Endotracheal or Tracheotomy tube secured NA Cuff deflated of air and inflated with saline NA Airway suctioned NA Electronic Signature(s) Signed: 07/31/2020 12:19:24 PM By: [name] EMT Entered By: [name] on 07/31/2020 10:40:23

## 2020-07-31 NOTE — Progress Notes (Signed)
[name] ([id number]) Visit Report for Khan Arrival Information Details Patient Name: Date of Service: [name] IE [name] 10:00 Sarah M Medical Record Number: [id number] Patient Account Number: [id number] Date of Birth/Sex: Treating RN: [birth year] (50 y.o. [name], Sarah Primary Care Yair Khan: [name] Other Clinician: Donavan Khan Referring [name]: Treating [name]/Extender: [name] in Treatment: 62 Visit Information History Since Last Visit All ordered tests and consults were completed: Yes Patient Arrived: Ambulatory Added or deleted any medications: No Arrival Time: 09:18 Any new allergies or adverse reactions: No Accompanied By: self Had Sarah fall or experienced change in No Transfer Assistance: None activities of daily living that may affect Patient Identification Verified: Yes risk of falls: Secondary Verification Process Completed: Yes Signs or symptoms of abuse/neglect since last visito No Patient Requires Transmission-Based Precautions: No Hospitalized since last visit: No Patient Has Alerts: No Implantable device outside of the clinic excluding No cellular tissue based products placed in the center since last visit: Pain Present Now: No Electronic Signature(s) Signed: 07/31/2020 12:19:24 PM By: [name] EMT Entered By: [name] on 07/31/2020 10:37:41 -------------------------------------------------------------------------------- Encounter Discharge Information Details Patient Name: Date of Service: Sarah Crochet V IE [name] 10:00 Sarah M Medical Record Number: [id number] Patient Account Number: [id number] Date of Birth/Sex: Treating RN: 1970/12/17 (50 y.o. [name] Primary Care [name]: [name] Other Clinician: Donavan Khan Referring [name]: Treating [name]/Extender: [name] in Treatment: [phone] Encounter  Discharge Information Items Discharge Condition: Stable Ambulatory Status: Ambulatory Discharge Destination: Home Transportation: Private Auto Accompanied By: self Schedule Follow-up Appointment: No Clinical Summary of Care: Electronic Signature(s) Signed: 07/31/2020 12:14:36 PM By: [name] EMT Entered By: [name] on 07/31/2020 12:14:35 -------------------------------------------------------------------------------- Vitals Details Patient Name: Date of Service: Sarah Crochet V IE [name] 10:00 Sarah M Medical Record Number: [id number] Patient Account Number: [id number] Date of Birth/Sex: Treating RN: 1970-03-07 (50 y.o. [name] Primary Care [name]: [name] Other Clinician: Donavan Khan Referring [name]: Treating [name]/Extender: [name] in Treatment: 16 Vital Signs Time Taken: 09:33 Temperature (F): 98.1 Height (in): 67 Pulse (bpm): 104 Weight (lbs): 257 Respiratory Rate (breaths/min): 20 Body Mass Index (BMI): 40.2 Blood Pressure (mmHg): 142/90 Reference Range: 80 - 120 mg / dl Electronic Signature(s) Signed: 07/31/2020 12:19:24 PM By: [name] EMT Entered By: [name] on 07/31/2020 10:38:23

## 2020-08-01 ENCOUNTER — Telehealth: Payer: Self-pay

## 2020-08-01 ENCOUNTER — Encounter (HOSPITAL_BASED_OUTPATIENT_CLINIC_OR_DEPARTMENT_OTHER): Payer: Medicaid Other | Admitting: Internal Medicine

## 2020-08-01 DIAGNOSIS — D0512 Intraductal carcinoma in situ of left breast: Secondary | ICD-10-CM

## 2020-08-01 DIAGNOSIS — T8131XD Disruption of external operation (surgical) wound, not elsewhere classified, subsequent encounter: Secondary | ICD-10-CM

## 2020-08-01 DIAGNOSIS — Z901 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: Secondary | ICD-10-CM | POA: Diagnosis not present

## 2020-08-01 DIAGNOSIS — L59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: Secondary | ICD-10-CM

## 2020-08-01 NOTE — Progress Notes (Signed)
[name], [name] ([id number]) Visit Report for 08/01/2020 SuperBill Details Patient Name: Date of Service: [name] 08/01/2020 Medical Record Number: [id number] Patient Account Number: [id number] Date of Birth/Sex: Treating RN: [birth year] (50 y.o. 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 Facility Procedures CPT4 Code Description Modifier Quantity [id number] G0277-(Facility Use Only) HBOT full body chamber, 69mn , 4 ICD-10 Diagnosis Description T81.31XD Disruption of external operation (surgical) wound, not elsewhere classified, subsequent encounter 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 Physician Procedures Quantity CPT4 Code Description Modifier [id number] PHYS HYPERBARIC OXYGEN THERAPY 1 ICD-10 Diagnosis Description T81.31XD Disruption of external operation (surgical) wound, not elsewhere classified, subsequent encounter 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

## 2020-08-01 NOTE — Progress Notes (Signed)
[name], SKWAREK ([id number]) Visit Report for 08/01/2020 HBO Details Patient Name: Date of Service: [name] 08/01/2020 10:00 A M Medical Record Number: [id number] Patient Account Number: [id number] Date of Birth/Sex: Treating RN: 07/15/1970 (50 y.o. 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 HBO Treatment Details Treatment Number: 5 Patient Type: Outpatient Chamber Type: Monoplace Chamber Serial #: [id number] Treatment Protocol: 2.5 ATA with 90 minutes oxygen, with two 5 minute air breaks Treatment Details Compression Rate Down: 2.0 psi / minute De-Compression Rate Up: 2.5 psi / minute A breaks and breathing ir Compress Tx Pressure periods Decompress Decompress Begins Reached (leave unused spaces Begins Ends blank) Chamber Pressure (ATA 1 2.5 2.5 2.5 2.5 2.5 - - 2.5 1 ) Clock Time (24 hr) 09:48 10:00 10:30 10:35 11:05 11:10 - - 11:40 11:50 Treatment Length: 122 (minutes) Treatment Segments: 4 Vital Signs Capillary Blood Glucose Reference Range: 80 - 120 mg / dl HBO Diabetic Blood Glucose Intervention Range: <131 mg/dl or >249 mg/dl Time Vitals Blood Respiratory Capillary Blood Glucose Pulse Action Type: Pulse: Temperature: Taken: Pressure: Rate: Glucose (mg/dl): Meter #: Oximetry (%) Taken: Pre 09:43 140/87 115 18 98.1 Post 11:53 116/80 83 18 98.2 Treatment Response Treatment Toleration: Well Treatment Completion Status: Treatment Completed without Adverse Event Masahiro Iglesia Notes Patient tolerated treatment well. Physician HBO Attestation: I certify that I supervised this HBO treatment in  accordance with Medicare guidelines. A trained emergency response team is readily available per Yes hospital policies and procedures. Continue HBOT as ordered. Yes Electronic Signature(s) Signed: 08/01/2020 5:24:48 PM By: Kalman Shan DO Previous Signature: 08/01/2020 4:08:59 PM Version By: [name] EMT Entered By: Kalman Shan on 08/01/2020 17:24:19 -------------------------------------------------------------------------------- HBO Safety Checklist Details Patient Name: Date of Service: [name] 08/01/2020 10:00 A M Medical Record Number: [id number] Patient Account Number: [id number] Date of Birth/Sex: Treating RN: Feb 15, 1970 (50 y.o. [name], [name] Primary Care Lacy Taglieri: [name] Other Clinician: [name] Referring Kiera Hussey: Treating Tajha Sammarco/Extender: [name] in Treatment: 16 HBO Safety Checklist Items Safety Checklist Consent Form Signed Patient voided / foley secured and emptied When did you last eato snack 0900 Last dose of injectable or oral agent n/a Ostomy pouch emptied and vented if applicable NA All implantable devices assessed, documented and approved NA Intravenous access site secured and place NA Valuables secured Linens and cotton and cotton/polyester blend (less than 51% polyester) Personal oil-based products / skin lotions / body lotions removed Wigs or hairpieces removed NA Smoking or tobacco materials removed NA Books / newspapers / magazines / loose paper removed Cologne, aftershave, perfume and deodorant removed Jewelry removed (may wrap wedding band) Make-up removed Hair care products removed Battery operated devices (external) removed Heating patches and chemical warmers removed NA Titanium eyewear removed NA Nail polish cured greater than 10 hours Casting material cured greater than 10 hours NA Hearing aids removed NA Loose dentures or partials  removed Prosthetics have been removed NA Patient demonstrates correct use of air break device (if applicable) Patient concerns have been addressed Patient grounding bracelet on and cord attached to chamber Specifics for Inpatients (complete in addition to above) Medication sheet sent with patient NA Intravenous medications needed  or due during therapy sent with patient NA Drainage tubes (e.g. nasogastric tube or chest tube secured and vented) NA Endotracheal or Tracheotomy tube secured NA Cuff deflated of air and inflated with saline NA Airway suctioned NA Electronic Signature(s) Signed: 08/01/2020 4:08:59 PM By: [name] EMT Entered By: [name] on 08/01/2020 11:05:20

## 2020-08-01 NOTE — Progress Notes (Signed)
[name], [name] ([id number]) Visit Report for 08/01/2020 Arrival Information Details Patient Name: Date of Service: [name] [name] 08/01/2020 10:00 Sarah M Medical Record Number: [id number] Patient Account Number: [id number] Date of Birth/Sex: Treating RN: Apr 28, 1970 (50 y.o. [name], [name] Primary Care Priest Lockridge: [name] Other Clinician: [name] Referring Reynaldo Rossman: Treating Kedra Mcglade/Extender: [name] in Treatment: 51 Visit Information History Since Last Visit All ordered tests and consults were completed: Yes Patient Arrived: Ambulatory Added or deleted any medications: No Arrival Time: 09:30 Any new allergies or adverse reactions: No Accompanied By: self Had Sarah fall or experienced change in No Transfer Assistance: None activities of daily living that may affect Patient Identification Verified: Yes risk of falls: Secondary Verification Process Completed: Yes Signs or symptoms of abuse/neglect since last visito No Patient Requires Transmission-Based Precautions: No Hospitalized since last visit: No Patient Has Alerts: No Implantable device outside of the clinic excluding No cellular tissue based products placed in the center since last visit: Pain Present Now: No Electronic Signature(s) Signed: 08/01/2020 4:08:59 PM By: [name] EMT Entered By: [name] on 08/01/2020 11:02:30 -------------------------------------------------------------------------------- Encounter Discharge Information Details Patient Name: Date of Service: Sarah Crochet V [name] 08/01/2020 10:00 Sarah M Medical Record Number: [id number] Patient Account Number: [id number] Date of Birth/Sex: Treating RN: Sep 05, 1970 (50 y.o. [name] Primary Care Damaria Vachon: [name] Other Clinician: [name] Referring Pacer Dorn: Treating Lakelynn Severtson/Extender: [name] in Treatment: 16 Encounter  Discharge Information Items Discharge Condition: Stable Ambulatory Status: Ambulatory Discharge Destination: Home Transportation: Private Auto Accompanied By: self Schedule Follow-up Appointment: No Clinical Summary of Care: Electronic Signature(s) Signed: 08/01/2020 4:08:59 PM By: [name] EMT Entered By: [name] on 08/01/2020 12:06:15 -------------------------------------------------------------------------------- Vitals Details Patient Name: Date of Service: Sarah Crochet V [name] 08/01/2020 10:00 Sarah M Medical Record Number: [id number] Patient Account Number: [id number] Date of Birth/Sex: Treating RN: 04/21/1970 (50 y.o. [name], [name] Primary Care Mathilda Maguire: [name] Other Clinician: [name] Referring Jered Heiny: Treating Tam Delisle/Extender: [name] in Treatment: 16 Vital Signs Time Taken: 09:43 Temperature (F): 98.1 Height (in): 67 Pulse (bpm): 115 Weight (lbs): 257 Respiratory Rate (breaths/min): 18 Body Mass Index (BMI): 40.2 Blood Pressure (mmHg): 140/87 Reference Range: 80 - 120 mg / dl Electronic Signature(s) Signed: 08/01/2020 4:08:59 PM By: [name] EMT Entered By: [name] on 08/01/2020 11:03:49

## 2020-08-01 NOTE — Telephone Encounter (Signed)
Patient called to let us know that her wound is now larger than a quarter and it's deep.  Patient said last Thursday a lot of fluid came out and the smell was really bad.  Patient is concerned and she would like to see Dr. Claudia Desanctis.  She said that she has been seeing Dr. Asa Saunas at the [facility] and he thinks they need to go back to surgery to close it while the tissue is alive.  Patient said that it's undermining in that area.  Patient said that Dr. Asa Saunas said there is a risk of necrosis after the hyperbaric therapy is finished.  Please call.

## 2020-08-02 ENCOUNTER — Other Ambulatory Visit: Payer: Self-pay

## 2020-08-02 ENCOUNTER — Encounter (HOSPITAL_BASED_OUTPATIENT_CLINIC_OR_DEPARTMENT_OTHER): Payer: Medicaid Other | Admitting: Internal Medicine

## 2020-08-02 DIAGNOSIS — D0512 Intraductal carcinoma in situ of left breast: Secondary | ICD-10-CM | POA: Diagnosis not present

## 2020-08-02 DIAGNOSIS — T8131XD Disruption of external operation (surgical) wound, not elsewhere classified, subsequent encounter: Secondary | ICD-10-CM

## 2020-08-02 DIAGNOSIS — L59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: Secondary | ICD-10-CM

## 2020-08-02 DIAGNOSIS — Z901 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: Secondary | ICD-10-CM | POA: Diagnosis not present

## 2020-08-02 NOTE — Progress Notes (Signed)
[name], [name] ([id number]) Visit Report for 08/02/2020 SuperBill Details Patient Name: Date of Service: [name] 08/02/2020 Medical Record Number: [id number] Patient Account Number: [id number] Date of Birth/Sex: Treating RN: Sep 28, 1970 (50 y.o. 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 Facility Procedures CPT4 Code Description Modifier Quantity [id number]-(Facility Use Only) HBOT full body chamber, 66mn , 4 ICD-10 Diagnosis Description T81.31XD Disruption of external operation (surgical) wound, not elsewhere classified, subsequent encounter 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 Physician Procedures Quantity CPT4 Code Description Modifier [id number] PHYS HYPERBARIC OXYGEN THERAPY 1 ICD-10 Diagnosis Description T81.31XD Disruption of external operation (surgical) wound, not elsewhere classified, subsequent encounter 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

## 2020-08-02 NOTE — Progress Notes (Signed)
[name], [name] ([id number]) Visit Report for 08/02/2020 HBO Details Patient Name: Date of Service: [name] 08/02/2020 10:00 A M Medical Record Number: [id number] Patient Account Number: [id number] Date of Birth/Sex: Treating RN: Jan 31, 1970 (50 y.o. 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 HBO Treatment Details Treatment Number: 6 Patient Type: Outpatient Chamber Type: Monoplace Chamber Serial #: [id number] Treatment Protocol: 2.5 ATA with 90 minutes oxygen, with two 5 minute air breaks Treatment Details Compression Rate Down: 2.0 psi / minute De-Compression Rate Up: 2.5 psi / minute A breaks and breathing ir Compress Tx Pressure periods Decompress Decompress Begins Reached (leave unused spaces Begins Ends blank) Chamber Pressure (ATA 1 2.5 2.5 2.5 2.5 2.5 - - 2.5 1 ) Clock Time (24 hr) 09:58 10:11 10:41 10:46 11:16 11:21 - - 11:51 12:02 Treatment Length: 124 (minutes) Treatment Segments: 4 Vital Signs Capillary Blood Glucose Reference Range: 80 - 120 mg / dl HBO Diabetic Blood Glucose Intervention Range: <131 mg/dl or >249 mg/dl Time Vitals Blood Respiratory Capillary Blood Glucose Pulse Action Type: Pulse: Temperature: Taken: Pressure: Rate: Glucose (mg/dl): Meter #: Oximetry (%) Taken: Pre 09:55 132/87 98 18 98 Post 12:03 116/86 81 18 97.7 Treatment Response Treatment Toleration: Well Treatment Completion Status: Treatment Completed without Adverse Event Physician HBO Attestation: I certify that I supervised this HBO treatment in accordance with Medicare guidelines. A trained  emergency response team is readily available per Yes hospital policies and procedures. Continue HBOT as ordered. Yes Electronic Signature(s) Signed: 08/02/2020 2:15:47 PM By: Kalman Shan DO Previous Signature: 08/02/2020 1:41:03 PM Version By: [name] EMT Entered By: Kalman Shan on 08/02/2020 14:15:13 -------------------------------------------------------------------------------- HBO Safety Checklist Details Patient Name: Date of Service: [name] [name] 08/02/2020 10:00 A M Medical Record Number: [id number] Patient Account Number: [id number] Date of Birth/Sex: Treating RN: [birth year] (50 y.o. [name] Primary Care Yaiden Yang: [name] Other Clinician: [name] Referring Glorie Dowlen: Treating Meiah Zamudio/Extender: [name] in Treatment: 16 HBO Safety Checklist Items Safety Checklist Consent Form Signed Patient voided / foley secured and emptied When did you last eato snack this AM Last dose of injectable or oral agent n/a Ostomy pouch emptied and vented if applicable NA All implantable devices assessed, documented and approved NA Intravenous access site secured and place NA Valuables secured Linens and cotton and cotton/polyester blend (less than 51% polyester) Personal oil-based products / skin lotions / body lotions removed Wigs or hairpieces removed NA Smoking or tobacco materials removed NA Books / newspapers / magazines / loose paper removed Cologne, aftershave, perfume and deodorant removed Jewelry removed (may wrap wedding band) Make-up removed Hair care products removed Battery operated devices (external) removed Heating patches and chemical warmers removed NA Titanium eyewear removed NA Nail polish cured greater than 10 hours Casting material cured greater than 10 hours NA Hearing aids removed NA Loose dentures or partials removed Prosthetics have been removed NA Patient  demonstrates correct use of air break device (if applicable) Patient concerns have been addressed Patient grounding bracelet on and cord attached to chamber Specifics for Inpatients (complete in addition to above) Medication sheet sent with patient NA Intravenous medications needed or due during therapy sent  with patient NA Drainage tubes (e.g. nasogastric tube or chest tube secured and vented) NA Endotracheal or Tracheotomy tube secured NA Cuff deflated of air and inflated with saline NA Airway suctioned NA Electronic Signature(s) Signed: 08/02/2020 3:36:55 PM By: [name] EMT Entered By: [name] on 08/02/2020 10:26:26

## 2020-08-02 NOTE — Progress Notes (Signed)
QUEEN, VLAD ([id number]) Visit Report for 08/02/2020 Arrival Information Details Patient Name: Date of Service: [name] 08/02/2020 10:00 A M Medical Record Number: [id number] Patient Account Number: [id number] Date of Birth/Sex: Treating RN: 1970/11/22 (50 y.o. [name], [name] Primary Care Katasha Riga: [name] Other Clinician: [name] Referring Deiona Hooper: Treating Dezaree Tracey/Extender: [name] in Treatment: 29 Visit Information History Since Last Visit All ordered tests and consults were completed: Yes Patient Arrived: Ambulatory Added or deleted any medications: No Arrival Time: 09:35 Any new allergies or adverse reactions: No Accompanied By: self Had a fall or experienced change in No Transfer Assistance: None activities of daily living that may affect Patient Identification Verified: Yes risk of falls: Secondary Verification Process Completed: Yes Signs or symptoms of abuse/neglect since last visito No Patient Requires Transmission-Based Precautions: No Hospitalized since last visit: No Patient Has Alerts: No Implantable device outside of the clinic excluding No cellular tissue based products placed in the center since last visit: Pain Present Now: No Electronic Signature(s) Signed: 08/02/2020 3:36:55 PM By: [name] EMT Entered By: [name] on 08/02/2020 10:24:38 -------------------------------------------------------------------------------- Encounter Discharge Information Details Patient Name: Date of Service: [name] 08/02/2020 10:00 A M Medical Record Number: [id number] Patient Account Number: [id number] Date of Birth/Sex: Treating RN: [birth year] (50 y.o. [name] Primary Care Cleophas Yoak: [name] Other Clinician: [name] Referring Kamie Korber: Treating Caleel Kiner/Extender: [name] in Treatment: 16 Encounter  Discharge Information Items Discharge Condition: Stable Ambulatory Status: Ambulatory Discharge Destination: Home Transportation: Private Auto Accompanied By: self Schedule Follow-up Appointment: No Clinical Summary of Care: Electronic Signature(s) Signed: 08/02/2020 1:53:16 PM By: [name] EMT Entered By: [name] on 08/02/2020 13:53:15 -------------------------------------------------------------------------------- Vitals Details Patient Name: Date of Service: [name] 08/02/2020 10:00 A M Medical Record Number: [id number] Patient Account Number: [id number] Date of Birth/Sex: Treating RN: 11-18-1970 (50 y.o. [name] Primary Care Nahomy Limburg: [name] Other Clinician: [name] Referring Jamell Opfer: Treating Nataya Bastedo/Extender: [name] in Treatment: 16 Vital Signs Time Taken: 09:55 Temperature (F): 98.0 Height (in): 67 Pulse (bpm): 98 Weight (lbs): 257 Respiratory Rate (breaths/min): 18 Body Mass Index (BMI): 40.2 Blood Pressure (mmHg): 132/87 Reference Range: 80 - 120 mg / dl Electronic Signature(s) Signed: 08/02/2020 3:36:55 PM By: [name] EMT Entered By: [name] on 08/02/2020 10:25:07

## 2020-08-05 ENCOUNTER — Encounter (HOSPITAL_BASED_OUTPATIENT_CLINIC_OR_DEPARTMENT_OTHER): Payer: Medicaid Other | Admitting: Internal Medicine

## 2020-08-05 ENCOUNTER — Other Ambulatory Visit: Payer: Self-pay

## 2020-08-05 DIAGNOSIS — L59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: Secondary | ICD-10-CM | POA: Diagnosis not present

## 2020-08-05 DIAGNOSIS — T8131XD Disruption of external operation (surgical) wound, not elsewhere classified, subsequent encounter: Secondary | ICD-10-CM | POA: Insufficient documentation

## 2020-08-05 DIAGNOSIS — Z901 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: Secondary | ICD-10-CM | POA: Insufficient documentation

## 2020-08-05 DIAGNOSIS — Z9013 Acquired absence of bilateral breasts and nipples: Secondary | ICD-10-CM | POA: Diagnosis not present

## 2020-08-05 DIAGNOSIS — Z923 Personal history of irradiation: Secondary | ICD-10-CM | POA: Insufficient documentation

## 2020-08-05 DIAGNOSIS — I10 Essential (primary) hypertension: Secondary | ICD-10-CM | POA: Insufficient documentation

## 2020-08-05 DIAGNOSIS — D0512 Intraductal carcinoma in situ of left breast: Secondary | ICD-10-CM | POA: Insufficient documentation

## 2020-08-05 DIAGNOSIS — Z79899 Other long term (current) drug therapy: Secondary | ICD-10-CM | POA: Insufficient documentation

## 2020-08-05 DIAGNOSIS — M1990 Unspecified osteoarthritis, unspecified site: Secondary | ICD-10-CM | POA: Diagnosis not present

## 2020-08-05 DIAGNOSIS — C50412 Malignant neoplasm of upper-outer quadrant of left female breast: Secondary | ICD-10-CM | POA: Diagnosis present

## 2020-08-05 DIAGNOSIS — Z9221 Personal history of antineoplastic chemotherapy: Secondary | ICD-10-CM | POA: Insufficient documentation

## 2020-08-05 DIAGNOSIS — Z148 Genetic carrier of other disease: Secondary | ICD-10-CM | POA: Diagnosis not present

## 2020-08-05 DIAGNOSIS — Z171 Estrogen receptor negative status [ER-]: Secondary | ICD-10-CM | POA: Diagnosis not present

## 2020-08-05 NOTE — Progress Notes (Signed)
Sarah, Khan ([id number]) Visit Report for 08/05/2020 Arrival Information Details Patient Name: Date of Service: [name] [name] 08/05/2020 10:00 Sarah M Medical Record Number: [id number] Patient Account Number: [id number] Date of Birth/Sex: Treating RN: 07/31/1970 (50 y.o. [name] Primary Care Eyoel Throgmorton: [name] Other Clinician: [name] Referring Moniqua Engebretsen: Treating Kaneesha Constantino/Extender: [name] in Treatment: 83 Visit Information History Since Last Visit All ordered tests and consults were completed: Yes Patient Arrived: Ambulatory Added or deleted any medications: No Arrival Time: 09:20 Any new allergies or adverse reactions: No Accompanied By: self Had Sarah fall or experienced change in No Transfer Assistance: None activities of daily living that may affect Patient Identification Verified: Yes risk of falls: Secondary Verification Process Completed: Yes Signs or symptoms of abuse/neglect since last visito No Patient Requires Transmission-Based Precautions: No Hospitalized since last visit: No Patient Has Alerts: No Implantable device outside of the clinic excluding No cellular tissue based products placed in the center since last visit: Pain Present Now: No Electronic Signature(s) Signed: 08/05/2020 3:40:19 PM By: [name] EMT Entered By: [name] on 08/05/2020 10:13:09 -------------------------------------------------------------------------------- Encounter Discharge Information Details Patient Name: Date of Service: Sarah Crochet V [name] 08/05/2020 10:00 Sarah M Medical Record Number: [id number] Patient Account Number: [id number] Date of Birth/Sex: Treating RN: October 22, 1970 (50 y.o. [name] Primary Care Rainier Feuerborn: [name] Other Clinician: [name] Referring Jermel Artley: Treating Shivansh Hardaway/Extender: [name] in Treatment: 16 Encounter Discharge  Information Items Discharge Condition: Stable Ambulatory Status: Ambulatory Discharge Destination: Home Transportation: Private Auto Accompanied By: self Schedule Follow-up Appointment: No Clinical Summary of Care: Electronic Signature(s) Signed: 08/05/2020 12:07:29 PM By: [name] EMT Entered By: [name] on 08/05/2020 12:07:28 -------------------------------------------------------------------------------- Vitals Details Patient Name: Date of Service: Sarah Crochet V [name] 08/05/2020 10:00 Sarah M Medical Record Number: [id number] Patient Account Number: [id number] Date of Birth/Sex: Treating RN: 1970-01-27 (50 y.o. [name] Primary Care Bennett Vanscyoc: [name] Other Clinician: [name] Referring Blanch Stang: Treating Travas Schexnayder/Extender: [name] in Treatment: 16 Vital Signs Time Taken: 09:34 Temperature (F): 97.9 Height (in): 67 Pulse (bpm): 114 Weight (lbs): 257 Respiratory Rate (breaths/min): 20 Body Mass Index (BMI): 40.2 Blood Pressure (mmHg): 125/95 Reference Range: 80 - 120 mg / dl Electronic Signature(s) Signed: 08/05/2020 3:40:19 PM By: [name] EMT Entered By: [name] on 08/05/2020 10:16:29

## 2020-08-05 NOTE — Progress Notes (Signed)
[name], [place] ([id number]) Visit Report for 08/05/2020 SuperBill Details Patient Name: Date of Service: [name] 08/05/2020 Medical Record Number: [id number] Patient Account Number: [id number] Date of Birth/Sex: Treating RN: 19-Dec-1970 (50 y.o. 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 Facility Procedures CPT4 Code Description Modifier Quantity [id number] G0277-(Facility Use Only) HBOT full body chamber, 69mn , 4 ICD-10 Diagnosis Description T81.31XD Disruption of external operation (surgical) wound, not elsewhere classified, subsequent encounter 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 Physician Procedures Quantity CPT4 Code Description Modifier [id number] PHYS HYPERBARIC OXYGEN THERAPY 1 ICD-10 Diagnosis Description T81.31XD Disruption of external operation (surgical) wound, not elsewhere classified, subsequent encounter 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

## 2020-08-05 NOTE — Telephone Encounter (Signed)
Patient is coming in 08/08/2020 for a visit with [facility] with a possible pop in with Dr. Claudia Desanctis. Continue seeing the wound clinic. See note in Mychart.

## 2020-08-05 NOTE — Progress Notes (Signed)
OSHAY, DOCKEN ([id number]) Visit Report for 08/05/2020 HBO Details Patient Name: Date of Service: [name]. 08/05/2020 10:00 [name] Medical Record Number: [id number] Patient Account Number: [id number] Date of Birth/Sex: Treating RN: 1970-05-05 (50 y.o. 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 HBO Treatment Details Treatment Number: 7 Patient Type: Outpatient Chamber Type: Monoplace Chamber Serial #: [id number] Treatment Protocol: 2.5 ATA with 90 minutes oxygen, with two 5 minute air breaks Treatment Details Compression Rate Down: 2.0 psi / minute De-Compression Rate Up: 2.5 psi / minute Sarah breaks and breathing ir Compress Tx Pressure periods Decompress Decompress Begins Reached (leave unused spaces Begins Ends blank) Chamber Pressure (ATA 1 2.5 2.5 2.5 2.5 2.5 - - 2.5 1 ) Clock Time (24 hr) 09:46 09:58 10:29 10:34 11:04 11:09 - - 11:39 11:48 Treatment Length: 122 (minutes) Treatment Segments: 4 Vital Signs Capillary Blood Glucose Reference Range: 80 - 120 mg / dl HBO Diabetic Blood Glucose Intervention Range: <131 mg/dl or >249 mg/dl Type: Time Vitals Blood Pulse: Respiratory Temperature: Capillary Blood Glucose Pulse Action Taken: Pressure: Rate: Glucose (mg/dl): Meter #: Oximetry (%) Taken: Pre 09:34 125/95 114 20 97.9 Alerted physician to pulse rate. Post 11:50 127/79 82 18 97.9 Treatment Response Treatment Toleration: Well Treatment Completion Status: Treatment Completed without Adverse Event [name] Notes Patient tolerated treatment well with no concerns. Physician HBO Attestation: I  certify that I supervised this HBO treatment in accordance with Medicare guidelines. [name] is readily available per Yes hospital policies and procedures. Continue HBOT as ordered. Yes Electronic Signature(s) Signed: 08/05/2020 5:42:03 PM By: Sarah Shan DO Previous Signature: 08/05/2020 12:03:10 PM Version By: [name] EMT Entered By: [name] on 08/05/2020 17:41:40 -------------------------------------------------------------------------------- HBO Safety Checklist Details Patient Name: Date of Service: [name] [name]. 08/05/2020 10:00 [name] Medical Record Number: [id number] Patient Account Number: [id number] Date of Birth/Sex: Treating RN: [birth year] (50 y.o. [name] Primary Care [name]: [name] Other Clinician: Donavan Khan Referring [name]: Treating [name]/Extender: [name] in Treatment: 16 HBO Safety Checklist Items Safety Checklist Consent Form Signed Patient voided / foley secured and emptied When did you last eato snack this AM 0915 Last dose of injectable or oral agent n/[name] pouch emptied and vented if applicable NA All implantable devices assessed, documented and approved NA Intravenous access site secured and place NA Valuables secured Linens and cotton and cotton/polyester blend (less than 51% polyester) Personal oil-based products / skin lotions / body lotions removed Wigs or hairpieces removed NA Smoking or tobacco materials removed NA Books / newspapers / magazines / loose paper removed Cologne, aftershave, perfume and deodorant removed Jewelry removed (may wrap wedding band) Make-up removed Hair care products removed Battery operated devices (external) removed Heating patches and chemical warmers removed Titanium eyewear removed NA Nail polish cured greater than 10 hours Casting material cured greater than 10 hours NA Hearing aids  removed NA Loose dentures or partials removed Prosthetics have been removed NA Patient demonstrates correct use of air break device (if applicable) Patient concerns have been addressed Patient grounding bracelet on and cord attached to chamber Specifics for Inpatients (complete in addition to above)  Medication sheet sent with patient NA Intravenous medications needed or due during therapy sent with patient NA Drainage tubes (e.g. nasogastric tube or chest tube secured and vented) NA Endotracheal or Tracheotomy tube secured NA Cuff deflated of air and inflated with saline NA Airway suctioned NA Electronic Signature(s) Signed: 08/05/2020 3:40:19 PM By: [name] EMT Entered By: [name] on 08/05/2020 10:18:01

## 2020-08-06 ENCOUNTER — Other Ambulatory Visit (HOSPITAL_COMMUNITY): Payer: Self-pay

## 2020-08-06 ENCOUNTER — Encounter (HOSPITAL_BASED_OUTPATIENT_CLINIC_OR_DEPARTMENT_OTHER): Payer: Medicaid Other | Admitting: Internal Medicine

## 2020-08-06 ENCOUNTER — Other Ambulatory Visit: Payer: Self-pay | Admitting: Oncology

## 2020-08-06 DIAGNOSIS — C50412 Malignant neoplasm of upper-outer quadrant of left female breast: Secondary | ICD-10-CM | POA: Diagnosis not present

## 2020-08-06 DIAGNOSIS — L59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: Secondary | ICD-10-CM | POA: Diagnosis not present

## 2020-08-06 MED ORDER — GABAPENTIN 300 MG PO CAPS
ORAL_CAPSULE | Freq: Every day | ORAL | 4 refills | Status: DC
  Filled 2020-08-06: qty 90, fill #0

## 2020-08-06 NOTE — Progress Notes (Signed)
APRILLE, GORNICK ([id number]) Visit Report for 08/06/2020 SuperBill Details Patient Name: Date of Service: [name] 08/06/2020 Medical Record Number: [id number] Patient Account Number: [id number] Date of Birth/Sex: Treating RN: 06/24/1970 (50 y.o. 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 Facility Procedures CPT4 Code Description Modifier Quantity [id number]-(Facility Use Only) HBOT full body chamber, 60mn , 4 ICD-10 Diagnosis Description T81.31XD Disruption of external operation (surgical) wound, not elsewhere classified, subsequent encounter 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 Physician Procedures Quantity CPT4 Code Description Modifier [id number] PHYS HYPERBARIC OXYGEN THERAPY 1 ICD-10 Diagnosis Description T81.31XD Disruption of external operation (surgical) wound, not elsewhere classified, subsequent encounter 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

## 2020-08-06 NOTE — Progress Notes (Addendum)
[name], [name] ([id number]) Visit Report for 08/06/2020 Arrival Information Details Patient Name: Date of Service: [name] [name] 08/06/2020 10:00 Sarah M Medical Record Number: [id number] Patient Account Number: [id number] Date of Birth/Sex: Treating RN: [birth year] (50 y.o. [name], [name] Primary Care Camielle Sizer: [name] Other Clinician: [name] Referring Jalon Blackwelder: Treating Aneliese Beaudry/Extender: [name] in Treatment: 54 Visit Information History Since Last Visit All ordered tests and consults were completed: Yes Patient Arrived: Ambulatory Added or deleted any medications: No Arrival Time: 09:15 Any new allergies or adverse reactions: No Accompanied By: self Had Sarah fall or experienced change in No Transfer Assistance: None activities of daily living that may affect Patient Identification Verified: Yes risk of falls: Secondary Verification Process Completed: Yes Signs or symptoms of abuse/neglect since last visito No Patient Requires Transmission-Based Precautions: No Hospitalized since last visit: No Patient Has Alerts: No Implantable device outside of the clinic excluding No cellular tissue based products placed in the center since last visit: Pain Present Now: No Electronic Signature(s) Signed: 08/06/2020 11:32:54 AM By: [name] EMT Entered By: [name] on 08/06/2020 11:21:37 -------------------------------------------------------------------------------- Encounter Discharge Information Details Patient Name: Date of Service: [name] [name] 08/06/2020 10:00 Sarah M Medical Record Number: [id number] Patient Account Number: [id number] Date of Birth/Sex: Treating RN: [birth year] (50 y.o. [name], [name] Primary Care Letoya Stallone: [name] Other Clinician: [name] Referring Claudette Wermuth: Treating Zoe Nordin/Extender: [name] in Treatment: 17 Encounter  Discharge Information Items Discharge Condition: Stable Ambulatory Status: Ambulatory Discharge Destination: Home Transportation: Private Auto Accompanied By: self Schedule Follow-up Appointment: No Clinical Summary of Care: Electronic Signature(s) Signed: 08/06/2020 12:52:34 PM By: [name] EMT Entered By: [name] on 08/06/2020 12:52:33 -------------------------------------------------------------------------------- Vitals Details Patient Name: Date of Service: [name] [name] 08/06/2020 10:00 Sarah M Medical Record Number: [id number] Patient Account Number: [id number] Date of Birth/Sex: Treating RN: Jun 08, 1970 (50 y.o. [name], [name] Primary Care Taquila Leys: [name] Other Clinician: [name] Referring Jayln Branscom: Treating Carnell Beavers/Extender: [name] in Treatment: 17 Vital Signs Time Taken: 09:37 Temperature (F): 98.2 Height (in): 67 Pulse (bpm): 105 Weight (lbs): 257 Respiratory Rate (breaths/min): 20 Body Mass Index (BMI): 40.2 Blood Pressure (mmHg): 138/108 Reference Range: 80 - 120 mg / dl Airway Pulse Oximetry (%): 99 Electronic Signature(s) Signed: 08/06/2020 11:32:54 AM By: [name] EMT Entered By: [name] on 08/06/2020 11:22:26

## 2020-08-06 NOTE — Progress Notes (Signed)
[name], [name] ([id number]) Visit Report for 08/06/2020 HBO Details Patient Name: Date of Service: [name] 08/06/2020 10:00 A M Medical Record Number: [id number] Patient Account Number: [id number] Date of Birth/Sex: Treating RN: 04-01-1970 (50 y.o. 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 HBO Treatment Details Treatment Number: 8 Patient Type: Outpatient Chamber Type: Monoplace Chamber Serial #: [id number] Treatment Protocol: 2.5 ATA with 90 minutes oxygen, with two 5 minute air breaks Treatment Details Compression Rate Down: 1.5 psi / minute De-Compression Rate Up: 2.5 psi / minute A breaks and breathing ir Compress Tx Pressure periods Decompress Decompress Begins Reached (leave unused spaces Begins Ends blank) Chamber Pressure (ATA 1 2.5 2.5 2.5 2.5 2.5 - - 2.5 1 ) Clock Time (24 hr) 09:45 10:01 10:31 10:36 11:06 11:11 - - 11:41 11:49 Treatment Length: 124 (minutes) Treatment Segments: 4 Vital Signs Capillary Blood Glucose Reference Range: 80 - 120 mg / dl HBO Diabetic Blood Glucose Intervention Range: <131 mg/dl or >249 mg/dl Type: Time Vitals Blood Pulse: Respiratory Capillary Blood Glucose Pulse Action Temperature: Taken: Pressure: Rate: Glucose (mg/dl): Meter #: Oximetry (%) Taken: Pre 09:37 138/108 105 20 98.2 99 BP was taken manually Pre 09:43 122/88 94 18 97.9 99 Treatment Response Treatment Toleration: Well Treatment Completion Status: Treatment Completed without Adverse Event Physician HBO Attestation: I certify that I supervised this HBO treatment in accordance with  Medicare guidelines. A trained emergency response team is readily available per Yes hospital policies and procedures. Continue HBOT as ordered. Yes Electronic Signature(s) Signed: 08/06/2020 5:41:50 PM By: Kalman Shan DO Previous Signature: 08/06/2020 12:53:16 PM Version By: [name] EMT Previous Signature: 08/06/2020 12:39:46 PM Version By: [name] EMT Entered By: Kalman Shan on 08/06/2020 17:41:20 -------------------------------------------------------------------------------- HBO Safety Checklist Details Patient Name: Date of Service: [name] [name] 08/06/2020 10:00 A M Medical Record Number: [id number] Patient Account Number: [id number] Date of Birth/Sex: Treating RN: 1970/12/13 (50 y.o. [name], [name] Primary Care Naila Elizondo: [name] Other Clinician: [name] Referring Pilot Prindle: Treating Teniola Tseng/Extender: [name] in Treatment: 17 HBO Safety Checklist Items Safety Checklist Consent Form Signed Patient voided / foley secured and emptied When did you last eato snack this AM 0900 Last dose of injectable or oral agent n/a Ostomy pouch emptied and vented if applicable NA All implantable devices assessed, documented and approved NA Intravenous access site secured and place NA Valuables secured Linens and cotton and cotton/polyester blend (less than 51% polyester) Personal oil-based products / skin lotions / body lotions removed Wigs or hairpieces removed NA Smoking or tobacco materials removed NA Books / newspapers / magazines / loose paper removed Cologne, aftershave, perfume and deodorant removed Jewelry removed (may wrap wedding band) Make-up removed NA Hair care products removed Battery operated devices (external) removed Heating patches and chemical warmers removed Titanium eyewear removed NA Nail polish cured greater than 10 hours Casting material cured greater than 10  hours NA Hearing aids removed NA Loose dentures or partials removed Prosthetics have been removed NA Patient demonstrates correct use of air break device (if applicable) Patient concerns have been addressed Patient grounding bracelet on and cord attached to chamber Specifics for Inpatients (complete in  addition to above) Medication sheet sent with patient NA Intravenous medications needed or due during therapy sent with patient NA Drainage tubes (e.g. nasogastric tube or chest tube secured and vented) NA Endotracheal or Tracheotomy tube secured NA Cuff deflated of air and inflated with saline NA Airway suctioned NA Electronic Signature(s) Signed: 08/06/2020 11:32:54 AM By: [name] EMT Entered By: [name] on 08/06/2020 11:25:05

## 2020-08-07 ENCOUNTER — Encounter (HOSPITAL_BASED_OUTPATIENT_CLINIC_OR_DEPARTMENT_OTHER): Payer: Medicaid Other | Admitting: Physician Assistant

## 2020-08-07 ENCOUNTER — Other Ambulatory Visit: Payer: Self-pay

## 2020-08-07 ENCOUNTER — Ambulatory Visit (INDEPENDENT_AMBULATORY_CARE_PROVIDER_SITE_OTHER): Payer: Medicaid Other

## 2020-08-07 VITALS — BP 126/72 | Temp 98.2°F | Ht 67.5 in | Wt 234.0 lb

## 2020-08-07 DIAGNOSIS — C50412 Malignant neoplasm of upper-outer quadrant of left female breast: Secondary | ICD-10-CM | POA: Diagnosis not present

## 2020-08-07 DIAGNOSIS — S21102A Unspecified open wound of left front wall of thorax without penetration into thoracic cavity, initial encounter: Secondary | ICD-10-CM

## 2020-08-07 NOTE — Progress Notes (Signed)
[name], [name] ([id number]) Visit Report for 08/07/2020 Arrival Information Details Patient Name: Date of Service: [name] [name] 08/07/2020 8:00 Sarah M Medical Record Number: [id number] Patient Account Number: [id number] Date of Birth/Sex: Treating RN: 08-24-1970 (50 y.o. [name], [name] Primary Care [name]: [name] Other Clinician: Donavan Khan Referring [name]: Treating [name]/Extender: [name] in Treatment: 18 Visit Information History Since Last Visit All ordered tests and consults were completed: Yes Patient Arrived: Ambulatory Added or deleted any medications: No Arrival Time: 07:40 Any new allergies or adverse reactions: No Accompanied By: self Had Sarah fall or experienced change in No Transfer Assistance: None activities of daily living that may affect Patient Identification Verified: Yes risk of falls: Secondary Verification Process Completed: Yes Signs or symptoms of abuse/neglect since last visito No Patient Requires Transmission-Based Precautions: No Hospitalized since last visit: No Patient Has Alerts: No Implantable device outside of the clinic excluding No cellular tissue based products placed in the center since last visit: Pain Present Now: No Electronic Signature(s) Signed: 08/07/2020 11:06:40 AM By: [name] EMT Entered By: [name] on 08/07/2020 09:45:06 -------------------------------------------------------------------------------- Encounter Discharge Information Details Patient Name: Date of Service: [name] [name] 08/07/2020 8:00 Sarah M Medical Record Number: [id number] Patient Account Number: [id number] Date of Birth/Sex: Treating RN: September 03, 1970 (50 y.o. [name] Primary Care Masey Scheiber: [name] Other Clinician: Donavan Khan Referring [name]: Treating [name]/Extender: [name] in Treatment: [phone] Encounter Discharge  Information Items Discharge Condition: Stable Ambulatory Status: Ambulatory Discharge Destination: Home Transportation: Private Auto Accompanied By: self Schedule Follow-up Appointment: No Clinical Summary of Care: Electronic Signature(s) Signed: 08/07/2020 11:02:39 AM By: [name] EMT Entered By: [name] on 08/07/2020 11:02:39 -------------------------------------------------------------------------------- Vitals Details Patient Name: Date of Service: [name] [name] 08/07/2020 8:00 Sarah M Medical Record Number: [id number] Patient Account Number: [id number] Date of Birth/Sex: Treating RN: [birth year] (50 y.o. [name] Primary Care Joliet Mallozzi: [name] Other Clinician: Donavan Khan Referring Davida Falconi: Treating [name]/Extender: [name] in Treatment: 17 Vital Signs Time Taken: 08:01 Temperature (F): 98.1 Height (in): 67 Pulse (bpm): 98 Weight (lbs): 257 Respiratory Rate (breaths/min): 18 Body Mass Index (BMI): 40.2 Blood Pressure (mmHg): 122/68 Reference Range: 80 - 120 mg / dl Airway Pulse Oximetry (%): 100 Electronic Signature(s) Signed: 08/07/2020 11:06:40 AM By: [name] EMT Entered By: [name] on 08/07/2020 09:47:18

## 2020-08-07 NOTE — Progress Notes (Signed)
[name], [name] ([id number]) Visit Report for 08/07/2020 Problem List Details Patient Name: Date of Service: [name] [name] [name] 08/07/2020 8:00 A M Medical Record Number: [id number] Patient Account Number: [id number] Date of Birth/Sex: Treating RN: [birth year] (50 y.o. 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 08/07/2020 Medical Record Number: [id number] Patient Account Number: [id number] Date of Birth/Sex: Treating RN: 1970-12-17 (50 y.o. 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  and nipple Facility Procedures CPT4 Code: [id number] Description: G0277-(Facility Use Only) HBOT full body chamber, 24mn , ICD-10 Diagnosis Description T81.31XD Disruption of external operation (surgical) wound, not elsewhere classified, subse 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 and subcutaneous  tissue 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 Modifier: quent encounter Quantity: 4 Physician Procedures : CPT4 Code Description Modifier [id number] PHYS HYPERBARIC OXYGEN THERAPY ICD-10 Diagnosis Description T81.31XD Disruption of external operation (surgical) wound, not elsewhere classified, subsequent encounter L59.8 Other specified disorders of  the skin and subcutaneous tissue 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

## 2020-08-07 NOTE — Progress Notes (Signed)
[name], [name] ([id number]) Visit Report for 08/07/2020 HBO Details Patient Name: Date of Service: [name]. 08/07/2020 8:00 A M Medical Record Number: [id number] Patient Account Number: [id number] Date of Birth/Sex: Treating RN: October 11, 1970 (50 y.o. 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 HBO Treatment Details Treatment Number: 9 Patient Type: Outpatient Chamber Type: Monoplace Chamber Serial #: [id number] Treatment Protocol: 2.5 ATA with 90 minutes oxygen, with two 5 minute air breaks Treatment Details Compression Rate Down: 2.0 psi / minute De-Compression Rate Up: 2.5 psi / minute A breaks and breathing ir Compress Tx Pressure periods Decompress Decompress Begins Reached (leave unused spaces Begins Ends blank) Chamber Pressure (ATA 1 2.5 2.5 2.5 2.5 2.5 - - 2.5 1 ) Clock Time (24 hr) 08:12 08:24 08:54 08:59 09:29 09:34 - - 10:04 10:13 Treatment Length: 121 (minutes) Treatment Segments: 4 Vital Signs Capillary Blood Glucose Reference Range: 80 - 120 mg / dl HBO Diabetic Blood Glucose Intervention Range: <131 mg/dl or >249 mg/dl Time Vitals Blood Respiratory Capillary Blood Glucose Pulse Action Type: Pulse: Temperature: Taken: Pressure: Rate: Glucose (mg/dl): Meter #: Oximetry (%) Taken: Pre 08:01 122/68 98 18 98.1 100 Post 10:21 104/78 88 18 98.1 100 Treatment Response Treatment Toleration: Well Treatment Completion Status: Treatment Completed without Adverse Event Ronny Korff Notes Patient tolerated treatment well today without any issues or concerns. Physician HBO Attestation: I certify  that I supervised this HBO treatment in accordance with Medicare guidelines. A trained emergency response team is readily available per Yes hospital policies and procedures. Continue HBOT as ordered. Yes Electronic Signature(s) Signed: 08/07/2020 5:21:18 PM By: Worthy Keeler PA-C Previous Signature: 08/07/2020 11:01:10 AM Version By: [name] EMT Entered By: Worthy Keeler on 08/07/2020 17:21:18 -------------------------------------------------------------------------------- HBO Safety Checklist Details Patient Name: Date of Service: [name]. 08/07/2020 8:00 A M Medical Record Number: [id number] Patient Account Number: [id number] Date of Birth/Sex: Treating RN: 1970-03-11 (50 y.o. [name] Primary Care Tavaras Goody: [name] Other Clinician: [name] Referring Carina Chaplin: Treating Lugene Beougher/Extender: Randon Goldsmith in Treatment: 17 HBO Safety Checklist Items Safety Checklist Consent Form Signed Patient voided / foley secured and emptied When did you last eato snack this AM 0715 Last dose of injectable or oral agent n/a Ostomy pouch emptied and vented if applicable NA All implantable devices assessed, documented and approved NA Intravenous access site secured and place NA Valuables secured Linens and cotton and cotton/polyester blend (less than 51% polyester) Personal oil-based products / skin lotions / body lotions removed Wigs or hairpieces removed NA Smoking or tobacco materials removed NA Books / newspapers / magazines / loose paper removed Cologne, aftershave, perfume and deodorant removed Jewelry removed (may wrap wedding band) Make-up removed Hair care products removed Battery operated devices (external) removed Heating patches and chemical warmers removed Titanium eyewear removed NA Nail polish cured greater than 10 hours Casting material cured greater than 10 hours NA Hearing aids  removed NA Loose dentures or partials removed Prosthetics have been removed NA Patient demonstrates correct use of air break device (if applicable) Patient concerns have been addressed Patient grounding bracelet on and cord attached to chamber Specifics for Inpatients (complete  in addition to above) Medication sheet sent with patient NA Intravenous medications needed or due during therapy sent with patient NA Drainage tubes (e.g. nasogastric tube or chest tube secured and vented) NA Endotracheal or Tracheotomy tube secured NA Cuff deflated of air and inflated with saline NA Airway suctioned NA Electronic Signature(s) Signed: 08/07/2020 11:06:40 AM By: [name] EMT Entered By: [name] on 08/07/2020 09:49:32

## 2020-08-08 ENCOUNTER — Encounter: Payer: Self-pay | Admitting: Plastic Surgery

## 2020-08-08 ENCOUNTER — Encounter (HOSPITAL_BASED_OUTPATIENT_CLINIC_OR_DEPARTMENT_OTHER): Payer: Medicaid Other | Admitting: Internal Medicine

## 2020-08-08 DIAGNOSIS — L59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: Secondary | ICD-10-CM | POA: Diagnosis not present

## 2020-08-08 DIAGNOSIS — C50412 Malignant neoplasm of upper-outer quadrant of left female breast: Secondary | ICD-10-CM | POA: Diagnosis not present

## 2020-08-09 ENCOUNTER — Other Ambulatory Visit: Payer: Self-pay

## 2020-08-09 ENCOUNTER — Encounter (HOSPITAL_BASED_OUTPATIENT_CLINIC_OR_DEPARTMENT_OTHER): Payer: Medicaid Other | Admitting: Internal Medicine

## 2020-08-09 DIAGNOSIS — L59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: Secondary | ICD-10-CM

## 2020-08-09 DIAGNOSIS — C50412 Malignant neoplasm of upper-outer quadrant of left female breast: Secondary | ICD-10-CM | POA: Diagnosis not present

## 2020-08-09 NOTE — Progress Notes (Signed)
[name], [name] ([id number]) Visit Report for 08/08/2020 SuperBill Details Patient Name: Date of Service: [name] [name] 08/08/2020 Medical Record Number: [id number] Patient Account Number: [id number] Date of Birth/Sex: Treating RN: 11-28-1970 (50 y.o. 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

## 2020-08-09 NOTE — Progress Notes (Signed)
DENNY, MCDOWALL ([id number]) Visit Report for 08/08/2020 SuperBill Details Patient Name: Date of Service: [name] 08/08/2020 Medical Record Number: [id number] Patient Account Number: [id number] Date of Birth/Sex: Treating RN: [birth year] (50 y.o. 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 Facility Procedures CPT4 Code Description Modifier Quantity [id number]-(Facility Use Only) HBOT full body chamber, 38mn , 4 ICD-10 Diagnosis Description T81.31XD Disruption of external operation (surgical) wound, not elsewhere classified, subsequent encounter 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 Physician Procedures Quantity CPT4 Code Description Modifier [id number] PHYS HYPERBARIC OXYGEN THERAPY 1 ICD-10 Diagnosis Description T81.31XD Disruption of external operation (surgical) wound, not elsewhere classified, subsequent encounter 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

## 2020-08-09 NOTE — Progress Notes (Addendum)
[name], [name] ([id number]) Visit Report for 08/09/2020 HBO Details Patient Name: Date of Service: [name] 08/09/2020 10:00 A M Medical Record Number: [id number] Patient Account Number: [id number] Date of Birth/Sex: Treating RN: [birth year] (50 y.o. 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 HBO Treatment Details Treatment Number: 11 Patient Type: Outpatient Chamber Type: Monoplace Chamber Serial #: [id number] Treatment Protocol: 2.5 ATA with 90 minutes oxygen, with two 5 minute air breaks Treatment Details Compression Rate Down: 2.0 psi / minute De-Compression Rate Up: 2.5 psi / minute A breaks and breathing ir Compress Tx Pressure periods Decompress Decompress Begins Reached (leave unused spaces Begins Ends blank) Chamber Pressure (ATA 1 2.5 2.5 2.5 2.5 2.5 - - 2.5 1 ) Clock Time (24 hr) 09:50 10:01 10:31 10:36 11:06 11:11 - - 11:41 11:52 Treatment Length: 122 (minutes) Treatment Segments: 4 Vital Signs Capillary Blood Glucose Reference Range: 80 - 120 mg / dl HBO Diabetic Blood Glucose Intervention Range: <131 mg/dl or >249 mg/dl Time Vitals Blood Respiratory Capillary Blood Glucose Pulse Action Type: Pulse: Temperature: Taken: Pressure: Rate: Glucose (mg/dl): Meter #: Oximetry (%) Taken: Pre 09:42 126/85 98 20 98.2 100 Post 10:16 111/67 18 18 98.1 100 Treatment Response Treatment Toleration: Well Treatment Completion Status: Treatment Completed without Adverse Event Catelin Manthe Notes Patient tolerated treatment well today without any issues or concerns. Physician HBO Attestation: I  certify that I supervised this HBO treatment in accordance with Medicare guidelines. A trained emergency response team is readily available per Yes hospital policies and procedures. Continue HBOT as ordered. Yes Electronic Signature(s) Signed: 08/09/2020 11:54:51 PM By: Kalman Shan DO Previous Signature: 08/09/2020 1:33:39 PM Version By: [name] EMT Entered By: Kalman Shan on 08/09/2020 23:54:51 -------------------------------------------------------------------------------- HBO Safety Checklist Details Patient Name: Date of Service: [name] 08/09/2020 10:00 A M Medical Record Number: [id number] Patient Account Number: [id number] Date of Birth/Sex: Treating RN: 05-Mar-1970 (50 y.o. [name] Primary Care Kayona Foor: [name] Other Clinician: [name] Referring Jebadiah Imperato: Treating Jolan Upchurch/Extender: [name] in Treatment: 17 HBO Safety Checklist Items Safety Checklist Consent Form Signed Patient voided / foley secured and emptied When did you last eato snack 0915 Last dose of injectable or oral agent n/a Ostomy pouch emptied and vented if applicable NA All implantable devices assessed, documented and approved NA Intravenous access site secured and place NA Valuables secured Linens and cotton and cotton/polyester blend (less than 51% polyester) Personal oil-based products / skin lotions / body lotions removed Wigs or hairpieces removed NA Smoking or tobacco materials removed NA Books / newspapers / magazines / loose paper removed Cologne, aftershave, perfume and deodorant removed Jewelry removed (may wrap wedding band) Make-up removed Hair care products removed Battery operated devices (external) removed Heating patches and chemical warmers removed Titanium eyewear removed NA Nail polish cured greater than 10 hours Casting material cured greater than 10 hours NA Hearing aids  removed NA Loose dentures or partials removed Prosthetics have been removed NA Patient demonstrates correct use of air break device (if applicable) Patient concerns have been addressed Patient grounding bracelet on and cord attached to chamber Specifics for Inpatients (complete in addition to above) Medication sheet  sent with patient NA Intravenous medications needed or due during therapy sent with patient NA Drainage tubes (e.g. nasogastric tube or chest tube secured and vented) NA Endotracheal or Tracheotomy tube secured NA Cuff deflated of air and inflated with saline NA Airway suctioned NA Electronic Signature(s) Signed: 08/09/2020 1:32:22 PM By: [name] EMT Entered By: [name] on 08/09/2020 13:32:22

## 2020-08-09 NOTE — Progress Notes (Signed)
NORMANI, BORNTREGER ([id number]) Visit Report for 08/08/2020 Arrival Information Details Patient Name: Date of Service: [name] 08/08/2020 9:45 A M Medical Record Number: [id number] Patient Account Number: [id number] Date of Birth/Sex: Treating RN: 09-23-1970 (50 y.o. [name] Primary Care Leontyne Manville: [name] Other Clinician: Referring Ascencion Stegner: Treating Qualyn Oyervides/Extender: [name] in Treatment: 51 Visit Information History Since Last Visit Added or deleted any medications: No Patient Arrived: Ambulatory Any new allergies or adverse reactions: No Arrival Time: 09:44 Had a fall or experienced change in No Accompanied By: self activities of daily living that may affect Transfer Assistance: None risk of falls: Patient Identification Verified: Yes Signs or symptoms of abuse/neglect since last visito No Secondary Verification Process Completed: Yes Hospitalized since last visit: No Patient Requires Transmission-Based Precautions: No Implantable device outside of the clinic excluding No Patient Has Alerts: No cellular tissue based products placed in the center since last visit: Has Dressing in Place as Prescribed: Yes Pain Present Now: Yes Electronic Signature(s) Signed: 08/08/2020 5:39:10 PM By: Baruch Gouty RN, BSN Entered By: Baruch Gouty on 08/08/2020 09:52:14 -------------------------------------------------------------------------------- Clinic Level of Care Assessment Details Patient Name: Date of Service: [name] 08/08/2020 9:45 A M Medical Record Number: [id number] Patient Account Number: [id number] Date of Birth/Sex: Treating RN: [birth year] (50 y.o. [name] Primary Care Anish Vana: [name] Other Clinician: Referring Adien Kimmel: Treating Korry Dalgleish/Extender: [name] in Treatment: 17 Clinic Level of Care Assessment Items TOOL 4 Quantity Score '[]'$  -  0 Use when only an EandM is performed on FOLLOW-UP visit ASSESSMENTS - Nursing Assessment / Reassessment X- 1 10 Reassessment of Co-morbidities (includes updates in patient status) X- 1 5 Reassessment of Adherence to Treatment Plan ASSESSMENTS - Wound and Skin A ssessment / Reassessment X - Simple Wound Assessment / Reassessment - one wound 1 5 '[]'$  - 0 Complex Wound Assessment / Reassessment - multiple wounds '[]'$  - 0 Dermatologic / Skin Assessment (not related to wound area) ASSESSMENTS - Focused Assessment '[]'$  - 0 Circumferential Edema Measurements - multi extremities '[]'$  - 0 Nutritional Assessment / Counseling / Intervention '[]'$  - 0 Lower Extremity Assessment (monofilament, tuning fork, pulses) '[]'$  - 0 Peripheral Arterial Disease Assessment (using hand held doppler) ASSESSMENTS - Ostomy and/or Continence Assessment and Care '[]'$  - 0 Incontinence Assessment and Management '[]'$  - 0 Ostomy Care Assessment and Management (repouching, etc.) PROCESS - Coordination of Care X - Simple Patient / Family Education for ongoing care 1 15 '[]'$  - 0 Complex (extensive) Patient / Family Education for ongoing care X- 1 10 Staff obtains Programmer, systems, Records, T Results / Process Orders est '[]'$  - 0 Staff telephones HHA, Nursing Homes / Clarify orders / etc '[]'$  - 0 Routine Transfer to another Facility (non-emergent condition) '[]'$  - 0 Routine Hospital Admission (non-emergent condition) '[]'$  - 0 New Admissions / Biomedical engineer / Ordering NPWT Apligraf, etc. , '[]'$  - 0 Emergency Hospital Admission (emergent condition) X- 1 10 Simple Discharge Coordination '[]'$  - 0 Complex (extensive) Discharge Coordination PROCESS - Special Needs '[]'$  - 0 Pediatric / Minor Patient Management '[]'$  - 0 Isolation Patient Management '[]'$  - 0 Hearing / Language / Visual special needs '[]'$  - 0 Assessment of Community assistance (transportation, D/C planning, etc.) '[]'$  - 0 Additional assistance / Altered mentation '[]'$  -  0 Support Surface(s) Assessment (bed, cushion, seat, etc.) INTERVENTIONS - Wound Cleansing / Measurement X - Simple Wound Cleansing - one wound 1  5 '[]'$  - 0 Complex Wound Cleansing - multiple wounds '[]'$  - 0 Wound Imaging (photographs - any number of wounds) '[]'$  - 0 Wound Tracing (instead of photographs) '[]'$  - 0 Simple Wound Measurement - one wound '[]'$  - 0 Complex Wound Measurement - multiple wounds INTERVENTIONS - Wound Dressings '[]'$  - 0 Small Wound Dressing one or multiple wounds '[]'$  - 0 Medium Wound Dressing one or multiple wounds '[]'$  - 0 Large Wound Dressing one or multiple wounds '[]'$  - 0 Application of Medications - topical '[]'$  - 0 Application of Medications - injection INTERVENTIONS - Miscellaneous '[]'$  - 0 External ear exam '[]'$  - 0 Specimen Collection (cultures, biopsies, blood, body fluids, etc.) '[]'$  - 0 Specimen(s) / Culture(s) sent or taken to Lab for analysis '[]'$  - 0 Patient Transfer (multiple staff / Civil Service fast streamer / Similar devices) '[]'$  - 0 Simple Staple / Suture removal (25 or less) '[]'$  - 0 Complex Staple / Suture removal (26 or more) '[]'$  - 0 Hypo / Hyperglycemic Management (close monitor of Blood Glucose) '[]'$  - 0 Ankle / Brachial Index (ABI) - do not check if billed separately X- 1 5 Vital Signs Has the patient been seen at the hospital within the last three years: Yes Total Score: 65 Level Of Care: New/Established - Level 2 Electronic Signature(s) Signed: 08/08/2020 5:39:10 PM By: Baruch Gouty RN, BSN Entered By: Baruch Gouty on 08/08/2020 10:08:11 -------------------------------------------------------------------------------- Encounter Discharge Information Details Patient Name: Date of Service: [name] 08/08/2020 9:45 A M Medical Record Number: [id number] Patient Account Number: [id number] Date of Birth/Sex: Treating RN: 1970-04-23 (50 y.o. [name] Primary Care [place] Yearby: [name] Other Clinician: Referring Surena Welge: Treating  Denetria Luevanos/Extender: [name] in Treatment: 17 Encounter Discharge Information Items Discharge Condition: Stable Ambulatory Status: Ambulatory Discharge Destination: Home Transportation: Private Auto Accompanied By: self Schedule Follow-up Appointment: Yes Clinical Summary of Care: Patient Declined Electronic Signature(s) Signed: 08/08/2020 5:39:10 PM By: Baruch Gouty RN, BSN Entered By: Baruch Gouty on 08/08/2020 10:09:06 -------------------------------------------------------------------------------- Patient/Caregiver Education Details Patient Name: Date of Service: [name] 8/4/2022andnbsp9:45 A M Medical Record Number: [id number] Patient Account Number: [id number] Date of Birth/Gender: Treating RN: 1970-12-17 (50 y.o. [name] Primary Care Physician: [name] Other Clinician: Referring Physician: Treating Physician/Extender: [name] in Treatment: 17 Education Assessment Education Provided To: Patient Education Topics Provided Wound/Skin Impairment: Methods: Explain/Verbal Responses: Reinforcements needed, State content correctly [employer]) Signed: 08/08/2020 5:39:10 PM By: Baruch Gouty RN, BSN Entered By: Baruch Gouty on 08/08/2020 10:08:52 -------------------------------------------------------------------------------- Wound Assessment Details Patient Name: Date of Service: [name] 08/08/2020 9:45 A M Medical Record Number: [id number] Patient Account Number: [id number] Date of Birth/Sex: Treating RN: Aug 02, 1970 (50 y.o. [name] Primary Care Kippy Melena: [name] Other Clinician: Referring Avry Monteleone: Treating Niaja Stickley/Extender: [name] in Treatment: 17 Wound Status Wound Number: 1 Primary Open Surgical Wound Etiology: Wound Location: Left Chest Secondary Necrosis (Radiation) Wounding  Event: Surgical Injury Etiology: Date Acquired: 01/16/2020 Wound Status: Open Weeks Of Treatment: 17 Comorbid Hypertension, Osteoarthritis, Received Chemotherapy, Clustered Wound: No History: Received Radiation Wound Measurements Length: (cm) 2.7 Width: (cm) 3.3 Depth: (cm) 1.6 Area: (cm) 6.998 Volume: (cm) 11.197 % Reduction in Area: -10% % Reduction in Volume: -60% Epithelialization: None Tunneling: No Undermining: No Wound Description Classification: Full Thickness Without Exposed Support Structures Wound Margin: Distinct, outline attached Exudate Amount: Large Exudate Type: Serosanguineous Exudate Color:  red, brown Foul Odor After Cleansing: No Slough/Fibrino Yes Wound Bed Granulation Amount: Medium (34-66%) Exposed Structure Granulation Quality: Red, Pink Fascia Exposed: No Necrotic Amount: Medium (34-66%) Fat Layer (Subcutaneous Tissue) Exposed: Yes Necrotic Quality: Adherent Slough Tendon Exposed: No Muscle Exposed: No Joint Exposed: No Bone Exposed: No Treatment Notes Wound #1 (Chest) Wound Laterality: Left Cleanser Wound Cleanser Discharge Instruction: Cleanse the wound with wound cleanser prior to applying a clean dressing using gauze sponges, not tissue or cotton balls. Peri-Wound Care Topical Primary Dressing Promogran Prisma Matrix, 4.34 (sq in) (silver collagen) Discharge Instruction: Moisten collagen with saline or hydrogel Secondary Dressing Woven Gauze Sponge, Non-Sterile 4x4 in Discharge Instruction: Apply over primary dressing as directed. ABD Pad, 8x10 Discharge Instruction: Apply over primary dressing as directed. Secured With Compression Wrap Compression Stockings Environmental education officer) Signed: 08/08/2020 5:39:10 PM By: Baruch Gouty RN, BSN Entered By: Baruch Gouty on 08/08/2020 10:07:37 -------------------------------------------------------------------------------- Vitals Details Patient Name: Date of Service: [name] 08/08/2020 9:45 A M Medical Record Number: [id number] Patient Account Number: [id number] Date of Birth/Sex: Treating RN: 30-May-1970 (50 y.o. [name] Primary Care Ingvald Theisen: [name] Other Clinician: Referring Salina Stanfield: Treating Markiah Janeway/Extender: [name] in Treatment: 17 Vital Signs Time Taken: 09:52 Temperature (F): 98.3 Height (in): 67 Pulse (bpm): 90 Source: Stated Respiratory Rate (breaths/min): 18 Weight (lbs): 257 Blood Pressure (mmHg): 135/86 Source: Stated Reference Range: 80 - 120 mg / dl Body Mass Index (BMI): 40.2 Electronic Signature(s) Signed: 08/08/2020 5:39:10 PM By: Baruch Gouty RN, BSN Entered By: Baruch Gouty on 08/08/2020 09:52:53

## 2020-08-10 NOTE — Progress Notes (Signed)
Khan, Sarah ([id number]) Visit Report for 08/09/2020 SuperBill Details Patient Name: Date of Service: [name] 08/09/2020 Medical Record Number: [id number] Patient Account Number: [id number] Date of Birth/Sex: Treating RN: 05-Jan-1971 (50 y.o. 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 Facility Procedures CPT4 Code Description Modifier Quantity [id number]-(Facility Use Only) HBOT full body chamber, 49mn , 4 ICD-10 Diagnosis Description T81.31XD Disruption of external operation (surgical) wound, not elsewhere classified, subsequent encounter 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 Physician Procedures Quantity CPT4 Code Description Modifier [id number] PHYS HYPERBARIC OXYGEN THERAPY 1 ICD-10 Diagnosis Description T81.31XD Disruption of external operation (surgical) wound, not elsewhere classified, subsequent encounter 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

## 2020-08-10 NOTE — Progress Notes (Signed)
08/07/20 Patient in office for a f/u- postop surgery with Dr. Claudia Desanctis on 07/15/20.  Debridement/flap closure of left chest wound & excision of excess tissue left axilla. Patient is being seen/treated at the wound center with hyperbaric tx- 5 days/week, wound care using collagen/Prisma daily all being directed by Dr. Dellia Nims. She has completed 10 sessions of hyperbaric & has 20 sessions remaining. She did just finish a round of Augmentin 3 days ago- ordered by Dr. Dellia Nims for infection.   Today she reports that the left chest wound continues to be painful to touch & has "yellow/green" & serosanguinous- for which she has to change dressings 4x/day d/t the "foul odor" Denies any fever,  n/v- but has had some "night sweats/chills" Photos were taken & entered into chart. Measurements today are: 3 cm x 3.5 cm x 2 cm.- compared to 07/29/20 which was noted at 2.7 cm x 3.3 cm x 1.6 cm. There is some noted undermining- but I did not identify any tunneling present.  There is some "pink/granulation" tissue present in the wound bed.  Pt reports that Dr. Dellia Nims has suggested that Dr. Claudia Desanctis may plan to perform future surgical intervention to assist with closure. I consulted with Dr. Claudia Desanctis & he reviewed the status/photos & recommends that patient continue wound care & hyperbaric tx per Dr. Janalyn Rouse care plan- then he will f/u with her to evaluate & plan next steps in the wound reconstruction/resolution.   He does want to wait at least 2-3 months before any further surgical intervention.  Dr. Claudia Desanctis is in contact with Dr. Dellia Nims regarding wound status & options in the future. I relayed this information to patient & her husband- however they were under the impression that Dr Dellia Nims & Dr. Claudia Desanctis would plan for surgery sooner. I informed her that Dr. Claudia Desanctis feels that it is too soon to return to surgery at this time. She voiced that she is very "frustrated" with the length of time that the wound is taking to resolve.  They were hoping  to see Dr. Claudia Desanctis today to discuss plan of care- & I informed them that his schedule today was completely full & that I consulted with him briefly to review the above information. I offered to schedule a f/u with him in 1-2 weeks - but patient declined & left the office without scheduling any future f/u visits.

## 2020-08-12 ENCOUNTER — Encounter (HOSPITAL_BASED_OUTPATIENT_CLINIC_OR_DEPARTMENT_OTHER): Payer: Medicaid Other | Admitting: Physician Assistant

## 2020-08-12 ENCOUNTER — Other Ambulatory Visit (HOSPITAL_COMMUNITY): Payer: Self-pay

## 2020-08-12 ENCOUNTER — Other Ambulatory Visit: Payer: Self-pay

## 2020-08-12 DIAGNOSIS — C50412 Malignant neoplasm of upper-outer quadrant of left female breast: Secondary | ICD-10-CM | POA: Diagnosis not present

## 2020-08-12 NOTE — Progress Notes (Addendum)
ANWITHA, HANLAN ([id number]) Visit Report for 08/09/2020 Arrival Information Details Patient Name: Date of Service: [name] [name] 08/09/2020 10:00 Sarah M Medical Record Number: [id number] Patient Account Number: [id number] Date of Birth/Sex: Treating RN: [birth year] (50 y.o. [name] Primary Care Havoc Sanluis: [name] Other Clinician: Referring Aracely Rickett: Treating Harshan Kearley/Extender: [name] in Treatment: 70 Visit Information History Since Last Visit All ordered tests and consults were completed: Yes Patient Arrived: Ambulatory Added or deleted any medications: No Arrival Time: 09:30 Any new allergies or adverse reactions: No Accompanied By: self Had Sarah fall or experienced change in No Transfer Assistance: None activities of daily living that may affect Patient Identification Verified: Yes risk of falls: Secondary Verification Process Completed: Yes Signs or symptoms of abuse/neglect since last visito No Patient Requires Transmission-Based Precautions: No Hospitalized since last visit: No Patient Has Alerts: No Implantable device outside of the clinic excluding No cellular tissue based products placed in the center since last visit: Pain Present Now: No Electronic Signature(s) Signed: 08/09/2020 1:30:33 PM By: [name] EMT Entered By: [name] on 08/09/2020 13:30:32 -------------------------------------------------------------------------------- Encounter Discharge Information Details Patient Name: Date of Service: [name] [name] 08/09/2020 10:00 Sarah M Medical Record Number: [id number] Patient Account Number: [id number] Date of Birth/Sex: Treating RN: 1970-03-21 (50 y.o. [name] Primary Care Hubert Raatz: [name] Other Clinician: [name] Referring Tyquez Hollibaugh: Treating Jakhiya Brower/Extender: [name] in Treatment: 17 Encounter Discharge Information  Items Discharge Condition: Stable Ambulatory Status: Ambulatory Discharge Destination: Home Transportation: Private Auto Accompanied By: self Schedule Follow-up Appointment: No Clinical Summary of Care: Electronic Signature(s) Signed: 08/09/2020 1:34:20 PM By: [name] EMT Entered By: [name] on 08/09/2020 13:34:20 -------------------------------------------------------------------------------- Vitals Details Patient Name: Date of Service: [name] [name] 08/09/2020 10:00 Sarah M Medical Record Number: [id number] Patient Account Number: [id number] Date of Birth/Sex: Treating RN: 09/11/1970 (50 y.o. [name] Primary Care Tameko Halder: [name] Other Clinician: [name] Referring Simmie Garin: Treating Zebulan Hinshaw/Extender: [name] in Treatment: 17 Vital Signs Time Taken: 09:42 Temperature (F): 98.2 Height (in): 67 Pulse (bpm): 98 Weight (lbs): 257 Respiratory Rate (breaths/min): 20 Body Mass Index (BMI): 40.2 Blood Pressure (mmHg): 126/85 Reference Range: 80 - 120 mg / dl Airway Pulse Oximetry (%): 100 Electronic Signature(s) Signed: 08/09/2020 1:30:57 PM By: [name] EMT Entered By: [name] on 08/09/2020 13:30:57

## 2020-08-13 ENCOUNTER — Encounter: Payer: Self-pay | Admitting: Oncology

## 2020-08-13 ENCOUNTER — Inpatient Hospital Stay: Payer: Medicaid Other | Attending: Oncology

## 2020-08-13 ENCOUNTER — Other Ambulatory Visit (HOSPITAL_COMMUNITY): Payer: Self-pay

## 2020-08-13 ENCOUNTER — Encounter (HOSPITAL_BASED_OUTPATIENT_CLINIC_OR_DEPARTMENT_OTHER): Payer: Medicaid Other | Admitting: Physician Assistant

## 2020-08-13 ENCOUNTER — Inpatient Hospital Stay (HOSPITAL_BASED_OUTPATIENT_CLINIC_OR_DEPARTMENT_OTHER): Payer: Medicaid Other | Admitting: Oncology

## 2020-08-13 VITALS — BP 126/72 | HR 109 | Temp 98.1°F | Resp 18 | Ht 67.5 in | Wt 234.5 lb

## 2020-08-13 DIAGNOSIS — C50912 Malignant neoplasm of unspecified site of left female breast: Secondary | ICD-10-CM | POA: Diagnosis not present

## 2020-08-13 DIAGNOSIS — Z171 Estrogen receptor negative status [ER-]: Secondary | ICD-10-CM | POA: Insufficient documentation

## 2020-08-13 DIAGNOSIS — C50412 Malignant neoplasm of upper-outer quadrant of left female breast: Secondary | ICD-10-CM

## 2020-08-13 DIAGNOSIS — Z1509 Genetic susceptibility to other malignant neoplasm: Secondary | ICD-10-CM

## 2020-08-13 DIAGNOSIS — Z6841 Body Mass Index (BMI) 40.0 and over, adult: Secondary | ICD-10-CM

## 2020-08-13 DIAGNOSIS — Z79899 Other long term (current) drug therapy: Secondary | ICD-10-CM | POA: Insufficient documentation

## 2020-08-13 DIAGNOSIS — K429 Umbilical hernia without obstruction or gangrene: Secondary | ICD-10-CM

## 2020-08-13 DIAGNOSIS — T8131XD Disruption of external operation (surgical) wound, not elsewhere classified, subsequent encounter: Secondary | ICD-10-CM | POA: Insufficient documentation

## 2020-08-13 DIAGNOSIS — M1990 Unspecified osteoarthritis, unspecified site: Secondary | ICD-10-CM | POA: Insufficient documentation

## 2020-08-13 DIAGNOSIS — Z9013 Acquired absence of bilateral breasts and nipples: Secondary | ICD-10-CM

## 2020-08-13 DIAGNOSIS — L59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: Secondary | ICD-10-CM | POA: Insufficient documentation

## 2020-08-13 DIAGNOSIS — I10 Essential (primary) hypertension: Secondary | ICD-10-CM | POA: Insufficient documentation

## 2020-08-13 DIAGNOSIS — Z9221 Personal history of antineoplastic chemotherapy: Secondary | ICD-10-CM | POA: Insufficient documentation

## 2020-08-13 DIAGNOSIS — Z95828 Presence of other vascular implants and grafts: Secondary | ICD-10-CM

## 2020-08-13 DIAGNOSIS — Z148 Genetic carrier of other disease: Secondary | ICD-10-CM | POA: Insufficient documentation

## 2020-08-13 DIAGNOSIS — Z1501 Genetic susceptibility to malignant neoplasm of breast: Secondary | ICD-10-CM

## 2020-08-13 LAB — COMPREHENSIVE METABOLIC PANEL
ALT: 14 U/L (ref 0–44)
AST: 14 U/L — ABNORMAL LOW (ref 15–41)
Albumin: 3.4 g/dL — ABNORMAL LOW (ref 3.5–5.0)
Alkaline Phosphatase: 100 U/L (ref 38–126)
Anion gap: 11 (ref 5–15)
BUN: 7 mg/dL (ref 6–20)
CO2: 25 mmol/L (ref 22–32)
Calcium: 9.2 mg/dL (ref 8.9–10.3)
Chloride: 104 mmol/L (ref 98–111)
Creatinine, Ser: 0.82 mg/dL (ref 0.44–1.00)
GFR, Estimated: >60 mL/min (ref >60)
Glucose, Bld: 176 mg/dL — ABNORMAL HIGH (ref 70–99)
Potassium: 3.4 mmol/L — ABNORMAL LOW (ref 3.5–5.1)
Sodium: 140 mmol/L (ref 135–145)
Total Bilirubin: 0.7 mg/dL (ref 0.3–1.2)
Total Protein: 7 g/dL (ref 6.5–8.1)

## 2020-08-13 LAB — CBC WITH DIFFERENTIAL/PLATELET
Abs Immature Granulocytes: 0.01 10*3/uL (ref 0.00–0.07)
Basophils Absolute: 0 10*3/uL (ref 0.0–0.1)
Basophils Relative: 1 %
Eosinophils Absolute: 0.2 10*3/uL (ref 0.0–0.5)
Eosinophils Relative: 5 %
HCT: 33.7 % — ABNORMAL LOW (ref 36.0–46.0)
Hemoglobin: 11 g/dL — ABNORMAL LOW (ref 12.0–15.0)
Immature Granulocytes: 0 %
Lymphocytes Relative: 32 %
Lymphs Abs: 1.3 10*3/uL (ref 0.7–4.0)
MCH: 28.8 pg (ref 26.0–34.0)
MCHC: 32.6 g/dL (ref 30.0–36.0)
MCV: 88.2 fL (ref 80.0–100.0)
Monocytes Absolute: 0.3 10*3/uL (ref 0.1–1.0)
Monocytes Relative: 7 %
Neutro Abs: 2.2 10*3/uL (ref 1.7–7.7)
Neutrophils Relative %: 55 %
Platelets: 156 10*3/uL (ref 150–400)
RBC: 3.82 MIL/uL — ABNORMAL LOW (ref 3.87–5.11)
RDW: 13.7 % (ref 11.5–15.5)
WBC: 3.9 10*3/uL — ABNORMAL LOW (ref 4.0–10.5)
nRBC: 0 % (ref 0.0–0.2)

## 2020-08-13 MED ORDER — HEPARIN SOD (PORK) LOCK FLUSH 100 UNIT/ML IV SOLN
500.0000 [IU] | Freq: Once | INTRAVENOUS | Status: AC
  Administered 2020-08-13: 500 [IU]
  Filled 2020-08-13: qty 5

## 2020-08-13 MED ORDER — GABAPENTIN 100 MG PO CAPS
ORAL_CAPSULE | ORAL | 3 refills | Status: DC
  Filled 2020-08-13 – 2020-08-19 (×2): qty 240, 60d supply, fill #0
  Filled 2020-08-23: qty 120, 30d supply, fill #0

## 2020-08-13 MED ORDER — DAKINS (1/2 STRENGTH) 0.25 % EX SOLN
CUTANEOUS | 3 refills | Status: DC
  Filled 2020-08-13: qty 473, 15d supply, fill #0
  Filled 2020-12-06: qty 473, 15d supply, fill #1

## 2020-08-13 MED ORDER — SODIUM CHLORIDE 0.9% FLUSH
10.0000 mL | Freq: Once | INTRAVENOUS | Status: AC
Start: 2020-08-13 — End: 2020-08-13
  Administered 2020-08-13: 10 mL
  Filled 2020-08-13: qty 10

## 2020-08-13 MED ORDER — GABAPENTIN 300 MG PO CAPS
900.0000 mg | ORAL_CAPSULE | Freq: Every day | ORAL | 4 refills | Status: DC
  Filled 2020-08-13 – 2020-08-19 (×3): qty 180, 60d supply, fill #0
  Filled 2020-09-07: qty 180, 60d supply, fill #1

## 2020-08-13 MED ORDER — TRAMADOL HCL 50 MG PO TABS
ORAL_TABLET | Freq: Four times a day (QID) | ORAL | 0 refills | Status: DC | PRN
  Filled 2020-08-13: qty 40, 5d supply, fill #0
  Filled 2020-09-17: qty 40, 5d supply, fill #1
  Filled 2020-09-28 – 2020-10-31 (×2): qty 40, 5d supply, fill #2

## 2020-08-13 NOTE — Progress Notes (Signed)
[name], [name] ([id number]) Visit Report for 08/12/2020 Arrival Information Details Patient Name: Date of Service: [name] [name] 08/12/2020 10:00 Sarah M Medical Record Number: [id number] Patient Account Number: [id number] Date of Birth/Sex: Treating RN: November 16, 1970 (50 y.o. [name] Primary Care Avery Klingbeil: [name] Other Clinician: [name] Referring Din Bookwalter: Treating Shakiara Lukic/Extender: Randon Goldsmith in Treatment: 60 Visit Information History Since Last Visit All ordered tests and consults were completed: Yes Patient Arrived: Ambulatory Added or deleted any medications: No Arrival Time: 09:30 Any new allergies or adverse reactions: No Accompanied By: self Had Sarah fall or experienced change in No Transfer Assistance: None activities of daily living that may affect Patient Identification Verified: Yes risk of falls: Secondary Verification Process Completed: Yes Signs or symptoms of abuse/neglect since last visito No Patient Requires Transmission-Based Precautions: No Hospitalized since last visit: No Patient Has Alerts: No Implantable device outside of the clinic excluding No cellular tissue based products placed in the center since last visit: Pain Present Now: No Electronic Signature(s) Signed: 08/13/2020 11:04:06 AM By: [name] EMT Entered By: [name] on 08/12/2020 11:45:55 -------------------------------------------------------------------------------- Encounter Discharge Information Details Patient Name: Date of Service: [name] V [name] 08/12/2020 10:00 Sarah M Medical Record Number: [id number] Patient Account Number: [id number] Date of Birth/Sex: Treating RN: February 28, 1970 (50 y.o. [name] Primary Care Jerrard Bradburn: [name] Other Clinician: [name] Referring Isadore Palecek: Treating Wyatte Dames/Extender: Randon Goldsmith in Treatment: 17 Encounter Discharge  Information Items Discharge Condition: Stable Ambulatory Status: Ambulatory Discharge Destination: Home Transportation: Private Auto Accompanied By: self Schedule Follow-up Appointment: No Clinical Summary of Care: Electronic Signature(s) Signed: 08/13/2020 11:04:06 AM By: [name] EMT Entered By: [name] on 08/12/2020 12:19:35 -------------------------------------------------------------------------------- Vitals Details Patient Name: Date of Service: [name] V [name] 08/12/2020 10:00 Sarah M Medical Record Number: [id number] Patient Account Number: [id number] Date of Birth/Sex: Treating RN: [name] (50 y.o. [name] Primary Care Darenda Fike: [name] Other Clinician: [name] Referring Darnita Woodrum: Treating Caasi Giglia/Extender: Randon Goldsmith in Treatment: 17 Vital Signs Time Taken: 09:51 Temperature (F): 97.8 Height (in): 67 Pulse (bpm): 96 Weight (lbs): 257 Respiratory Rate (breaths/min): 18 Body Mass Index (BMI): 40.2 Blood Pressure (mmHg): 146/86 Reference Range: 80 - 120 mg / dl Airway Pulse Oximetry (%): 100 Electronic Signature(s) Signed: 08/13/2020 11:04:06 AM By: [name] EMT Entered By: [name] on 08/12/2020 11:46:44

## 2020-08-13 NOTE — Progress Notes (Signed)
[name]  [phone] [phone]     ID: [name] DOB: October 06, 1970  MR#: [id number]  TUU#:[id number]  Patient Care Team: [name], MD as PCP - General (Internal Medicine) Miranda Garber, [name], MD as Consulting Physician (Oncology) [name], MD as Attending Physician (Radiation Oncology) Delice Bison Charlestine Massed, NP as Nurse Practitioner (Hematology and Oncology) Rolm Bookbinder, MD as Consulting Physician (General Surgery) Claudia Desanctis Steffanie Dunn, MD as Consulting Physician (Plastic Surgery) Mauro Kaufmann, RN as Oncology Nurse Navigator Rockwell Germany, RN as Oncology Nurse Navigator Milus Banister, MD as Attending Physician (Gastroenterology) Ricard Dillon, MD as Consulting Physician (Internal Medicine) OTHER MD:   CHIEF COMPLAINT: Triple negative breast cancer; BRCA2 positive  CURRENT TREATMENT: Hyperbaric treatment; olaparib to follow-up   INTERVAL HISTORY: [name] returns today for a follow-up of her triple negative breast cancer accompanied by her husband [name] and her younger daughter.   She was treated intensively with hyperbaric oxygen therapy under Dr. Dellia Nims.  Treatment started 04/16/2020.  They noted slow but significant improvement and on 07/15/2020 she underwent debridement of the left chest wound (6 x 6 cm including skin and subcutaneous tissue) as well as reconstruction with adjacent tissue transfer totaling 18 x 15 cm including the primary and secondary defect.  Pathology from this procedure (MCS-2 [id number]) showed no cancer in the specimen.  Unfortunately there has been dehiscence in the superior aspect of the wound.  [name] is now undergoing a second course of hyperbaric treatment.  She and her family have become experts dressing the wound at home which they do on a daily basis.  Her bilateral salpingo-oophorectomy and hysterectomy has been postponed until after the current issues have settled down.   REVIEW  OF SYSTEMS: Hiilei has significant discomfort in the left chest.  She uses tramadol at bedtime and takes gabapentin now 900 mg also at bedtime and then 200 mg twice daily.  She has a few Percocets left which she uses sparingly.  She had a recent experience with a plastic surgery clinic nurse which she describes as totally unprofessional and she has discussed this with Dr. Claudia Desanctis.  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  She is very careful regarding COVID exposures   COVID 19 VACCINATION STATUS: not vaccinated (as of 02/12/2020)   BREAST CANCER HISTORY: From the original intake note:  The patient was seen in the emergency room 05/16/2016 with nonspecific complaints but also noting that she had a tender lump in her lateral left breast which she said she had noted the day before. Exam by the emergency room physician confirmed a 3 cm soft left lateral breast mass without overlying erythema or nipple changes. This was felt to be most consistent with fibrocystic change but the patient was referred back to her primary care physician for further evaluation. She saw the physician assistant in May 18 and then Dr. Wynetta Emery on 06/12/2016 who scheduled a bilateral diagnostic mammography with tomography and left breast ultrasonography at the [facility] 06/18/2016. This found the breast density to be category B. In the left breast upper outer quadrant there was an area of asymmetry measuring up to 10.3 cm. On exam this was firm and palpable and on ultrasound there was an irregular mass measuring 2.5 cm with some subtle changes in the surrounding tissue. In the left lower axilla there were 2 suspicious-looking lymph nodes.  Biopsy of the left breast upper outer quadrant  and one of the suspicious lymph nodes 06/19/2016 showed ([id number]) both to be involved by invasive ductal carcinoma, grade 3, estrogen and progesterone receptor negative, HER-2 not amplified  with a signals ratio being 1.53-1.74 and the number per cell 2.35-3.05. The MIB-1 was 90%.  The patient's subsequent history is as detailed below.   PAST MEDICAL HISTORY: Past Medical History:  Diagnosis Date   Arthritis    Breast cancer ([place])    Left Breast Cancer   Breast cancer ([place]) 82/95/6213   left    Complication of anesthesia    DAY SURGERY 2010 OR 2011 ASPIRATED AND STAYED OVERNIGHT   History of radiation therapy 03/11/17- 04/21/17   Left breast, 2 Gy in 25 fractions for a total dose of 50 Gy, Boost, 2 Gy in 5 fractions for a total dose of 10 Gy   Hypertension    OFF MEDS  SINCE CHEMO X 5 MONTHS   Malignant neoplasm of upper-outer quadrant of left breast in female, estrogen receptor negative ([place]) 06/23/2016   Personal history of chemotherapy 2019   Left Breast Cancer  07-2016-12-2016   Personal history of radiation therapy 2019   Left Breast Cancer    PAST SURGICAL HISTORY: Past Surgical History:  Procedure Laterality Date   ANKLE SURGERY Right    BREAST CYST EXCISION Right 07/15/2020   Procedure: Excision excess right axillary tissue;  Surgeon: Cindra Presume, MD;  Location: [place];  Service: Plastics;  Laterality: Right;   BREAST LUMPECTOMY Left 01/27/2017   BREAST LUMPECTOMY WITH RADIOACTIVE SEED AND SENTINEL LYMPH NODE BIOPSY Left 01/27/2017   Procedure: LEFT BREAST LUMPECTOMY WITH RADIOACTIVE SEED LOCALIZATION, LEFT AXILLARY DEEP LYMPH NODE BIOPSY WITH RADIOACTIVE SEED LOCALIZATION, LEFT AXILLARY SENTINEL LYMPH NODE MAPPING AND BIOPSY WITH BLUE DYE INJECTION;  Surgeon: [name], MD;  Location: [facility];  Service: General;  Laterality: Left;   COLONOSCOPY WITH PROPOFOL N/A 02/01/2020   Procedure: COLONOSCOPY WITH PROPOFOL;  Surgeon: Milus Banister, MD;  Location: [facility];  Service: Endoscopy;  Laterality: N/A;   DEBRIDEMENT AND CLOSURE WOUND Left 03/28/2020   Procedure: DEBRIDEMENT AND CLOSURE OF MASTECTOMY WOUND;  Surgeon: Rolm Bookbinder, MD;  Location:  [facility];  Service: General;  Laterality: Left;   DEBRIDEMENT AND CLOSURE WOUND Left 07/15/2020   Procedure: Debridement of left chest wound and rotational flap closure;  Surgeon: Cindra Presume, MD;  Location: [place];  Service: Plastics;  Laterality: Left;  1.5 hour   ENDOSCOPIC MUCOSAL RESECTION  02/01/2020   Procedure: ENDOSCOPIC MUCOSAL RESECTION;  Surgeon: Milus Banister, MD;  Location: [facility];  Service: Endoscopy;;   HEMOSTASIS CLIP PLACEMENT  02/01/2020   Procedure: HEMOSTASIS CLIP PLACEMENT;  Surgeon: Milus Banister, MD;  Location: [facility];  Service: Endoscopy;;   IR FLUORO GUIDE PORT INSERTION RIGHT  07/14/2016   IR US GUIDE VASC ACCESS RIGHT  07/14/2016   MASTECTOMY W/ SENTINEL NODE BIOPSY Bilateral 01/09/2020   Procedure: BILATERAL MASTECTOMY WITH LEFT AXILLARY SENTINEL LYMPH NODE BIOPSY;  Surgeon: Rolm Bookbinder, MD;  Location: [employer];  Service: General;  Laterality: Bilateral;   PORT-A-CATH REMOVAL Right 01/27/2017   Procedure: REMOVAL PORT-A-CATH;  Surgeon: [name], MD;  Location: [facility];  Service: General;  Laterality: Right;   PORTACATH PLACEMENT Right 01/09/2020   Procedure: INSERTION PORT-A-CATH;  Surgeon: Rolm Bookbinder, MD;  Location: [facility];  Service: General;  Laterality: Right;   SUBMUCOSAL LIFTING INJECTION  02/01/2020   Procedure: SUBMUCOSAL LIFTING INJECTION;  Surgeon:  Milus Banister, MD;  Location: [facility];  Service: Endoscopy;;   TUBAL LIGATION     WISDOM TOOTH EXTRACTION      FAMILY HISTORY Family History  Problem Relation Age of Onset   Diabetes Mother    Hypertension Mother    Arthritis Mother    Colon cancer Father 7       died .65 metastatic at time of diagnosis   Diabetes Father    Heart disease Father    Hypertension Father    Breast cancer Maternal Grandmother 58       d.63s   Breast cancer Maternal Aunt 52   Cervical cancer Maternal Aunt 55   Breast cancer Maternal Aunt 46        d.50s   Cancer Maternal Uncle        d.62s unspecified type of cancer   Breast cancer Cousin 65       paternal first-cousin (daughter of unaffected aunt)   Cancer Maternal Aunt 48       "Female Cancer"   Cancer Maternal Aunt        unknown cancer   Brain cancer Daughter 16   Esophageal cancer Other    Colon polyps Neg Hx    Rectal cancer Neg Hx    Stomach cancer Neg Hx   The patient's father died at age 54 from colon cancer which she never had treated. The patient's mother is living, 26 years old as of June 2018. The patient has one brother, one sister. On the mother's side there is a history of breast cancer in the grandmother, age 17, and then 1 aunt with breast cancer age 91, and another with lung cancer age 13. There is no history of ovarian cancer in the family.   GYNECOLOGIC HISTORY:  No LMP recorded (lmp unknown). Patient is postmenopausal. Menarche age 57, first live birth age 42. She is GX P4. Her periods have never been regular. She had a period in June 2018 but not for 4 months before that. She took birth control for some years remotely with no complications. She is status post bilateral tubal ligation.   SOCIAL HISTORY:  The patient's husband owns and runs a food truck.. They serve mostly [place] and [place] food. 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 Her husband Chantalle Defilippo (goes by "[name]") is originally from [name]. The patient has two children from her first marriage. Her son [name], who is 75 years old as of Oct 2021, was previously in the [employer], but now is taking courses at [employer] and  her 45 y/o daughter [name], who was studying psychology at [name] before her optic pathway germinoma, which has left her 97% blind.  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 scholarship, doing premed) and 9 as of October 2020    ADVANCED DIRECTIVES: In the absence of any documents to the contrary the patient's husband is her healthcare power of attorney   HEALTH MAINTENANCE: Social History   Tobacco Use   Smoking status: Never   Smokeless tobacco: Never  Vaping Use   Vaping Use: Never used  Substance Use Topics   Alcohol use: No   Drug use: No     Colonoscopy: 02/01/2020  HBZ:JIRC 2018  Bone density:Never   Allergies  Allergen Reactions   Carboplatin Other (  See Comments)    Red, blotchy, hot    Current Outpatient Medications  Medication Sig Dispense Refill   gabapentin (NEURONTIN) 300 MG capsule Take 3 capsules (900 mg total) by mouth at bedtime. 180 capsule 4   anastrozole (ARIMIDEX) 1 MG tablet TAKE 1 TABLET BY MOUTH DAILY. (Patient taking differently: Take 1 mg by mouth every evening.) 90 tablet 4   COLLAGEN MATRIX MESHED, PORC, EX Apply 1 each topically every other day. Collagen Wound Packing every other day     gabapentin (NEURONTIN) 100 MG capsule TAKE 2 CAPSULES BY MOUTH IN THE MORNING AND 2 CAPSULES IN THE AFTERNOON 240 capsule 3   lidocaine-prilocaine (EMLA) cream APPLY TO AFFECTED AREA ONCE AS DIRECTED (Patient taking differently: Apply 1 application topically daily as needed (prior to lab draws/port flushes).) 30 g 3   LORazepam (ATIVAN) 1 MG tablet Take 1 tablet (1 mg total) by mouth prior to procedure as needed 30 tablet 1   olaparib (LYNPARZA) 150 MG tablet Take 1 tablet (150 mg total) by mouth at bedtime. Swallow whole. May take with food to decrease nausea and vomiting.Start May 23, 2020 120 tablet 6   ondansetron (ZOFRAN) 4 MG tablet Take 1 tablet (4 mg total) by mouth every 8 (eight) hours as needed for nausea or vomiting. 5 tablet 0   potassium chloride SA (KLOR-CON) 20 MEQ tablet TAKE 1 TABLET BY MOUTH ONCE A DAY (Patient taking differently: Take 20 mEq by mouth at bedtime.) 30 tablet 2   prochlorperazine  (COMPAZINE) 10 MG tablet TAKE 1 TABLET BY MOUTH EVERY 6 HOURS AS NEEDED FOR NAUSEA OR VOMITING 30 tablet 1   senna-docusate (SENOKOT-S) 8.6-50 MG tablet Take 2 tablets by mouth at bedtime.     sodium hypochlorite (DAKIN'S 1/2 STRENGTH) external solution Moisten gauze with Dakin's solution then wring out leaving gauze damp not saturated before packing into the wound bed lightly as directed 473 mL 3   traMADol (ULTRAM) 50 MG tablet TAKE 1 TO 2 TABLETS BY MOUTH EVERY 6 HOURS AS NEEDED. 120 tablet 0   No current facility-administered medications for this visit.    OBJECTIVE: White woman who appears stated age  50:   08/13/20 1459  BP: 126/72  Pulse: (!) 109  Resp: 18  Temp: 98.1 F (36.7 C)  SpO2: 98%      Body mass index is 36.19 kg/m.   Filed Weights   08/13/20 1459  Weight: 234 lb 8 oz (106.4 kg)    ECOG FS:1 - Symptomatic but completely ambulatory  Sclerae unicteric, EOMs intact Wearing a mask No cervical or supraclavicular adenopathy Lungs no rales or rhonchi Heart regular rate and rhythm Abd soft, obese, nontender, no masses palpated MSK no focal spinal tenderness Neuro: nonfocal, well oriented, frustrated affect Breasts: Status post bilateral mastectomies.  The left chest wall is imaged below.  Left chest wall 08/13/2020    Left chest wall 05/23/2020     Left chest wall march 2022     LAB RESULTS:  CMP     Component Value Date/Time   NA 140 08/13/2020 1408   NA 140 01/07/2017 1342   K 3.4 (L) 08/13/2020 1408   K 3.5 01/07/2017 1342   CL 104 08/13/2020 1408   CO2 25 08/13/2020 1408   CO2 27 01/07/2017 1342   GLUCOSE 176 (H) 08/13/2020 1408   GLUCOSE 148 (H) 01/07/2017 1342   BUN 7 08/13/2020 1408   BUN 6.2 (L) 01/07/2017 1342   CREATININE 0.82 08/13/2020 1408  CREATININE 0.79 12/28/2019 1322   CREATININE 0.7 01/07/2017 1342   CALCIUM 9.2 08/13/2020 1408   CALCIUM 9.4 01/07/2017 1342   PROT 7.0 08/13/2020 1408   PROT 7.0 01/07/2017 1342    ALBUMIN 3.4 (L) 08/13/2020 1408   ALBUMIN 3.7 01/07/2017 1342   AST 14 (L) 08/13/2020 1408   AST 20 12/28/2019 1322   AST 23 01/07/2017 1342   ALT 14 08/13/2020 1408   ALT 19 12/28/2019 1322   ALT 24 01/07/2017 1342   ALKPHOS 100 08/13/2020 1408   ALKPHOS 105 01/07/2017 1342   BILITOT 0.7 08/13/2020 1408   BILITOT 0.9 12/28/2019 1322   BILITOT 0.84 01/07/2017 1342   GFRNONAA >60 08/13/2020 1408   GFRNONAA >60 12/28/2019 1322   GFRAA >60 05/23/2019 1921   GFRAA >60 10/13/2018 1346    No results found for: TOTALPROTELP, ALBUMINELP, A1GS, A2GS, BETS, BETA2SER, GAMS, MSPIKE, SPEI  No results found for: Nils Pyle, [facility]  Lab Results  Component Value Date   WBC 3.9 (L) 08/13/2020   NEUTROABS 2.2 08/13/2020   HGB 11.0 (L) 08/13/2020   HCT 33.7 (L) 08/13/2020   MCV 88.2 08/13/2020   PLT 156 08/13/2020      Chemistry      Component Value Date/Time   NA 140 08/13/2020 1408   NA 140 01/07/2017 1342   K 3.4 (L) 08/13/2020 1408   K 3.5 01/07/2017 1342   CL 104 08/13/2020 1408   CO2 25 08/13/2020 1408   CO2 27 01/07/2017 1342   BUN 7 08/13/2020 1408   BUN 6.2 (L) 01/07/2017 1342   CREATININE 0.82 08/13/2020 1408   CREATININE 0.79 12/28/2019 1322   CREATININE 0.7 01/07/2017 1342      Component Value Date/Time   CALCIUM 9.2 08/13/2020 1408   CALCIUM 9.4 01/07/2017 1342   ALKPHOS 100 08/13/2020 1408   ALKPHOS 105 01/07/2017 1342   AST 14 (L) 08/13/2020 1408   AST 20 12/28/2019 1322   AST 23 01/07/2017 1342   ALT 14 08/13/2020 1408   ALT 19 12/28/2019 1322   ALT 24 01/07/2017 1342   BILITOT 0.7 08/13/2020 1408   BILITOT 0.9 12/28/2019 1322   BILITOT 0.84 01/07/2017 1342       No results found for: LABCA2  No components found for: [id number]  No results for input(s): INR in the last 168 hours.  Urinalysis    Component Value Date/Time   COLORURINE YELLOW 12/05/2019 1154   APPEARANCEUR CLOUDY (A) 12/05/2019 1154   LABSPEC 1.024  12/05/2019 1154   LABSPEC 1.025 11/23/2016 1251   PHURINE 5.0 12/05/2019 1154   GLUCOSEU NEGATIVE 12/05/2019 1154   GLUCOSEU Negative 11/23/2016 1251   HGBUR SMALL (A) 12/05/2019 1154   BILIRUBINUR NEGATIVE 12/05/2019 1154   BILIRUBINUR Negative 11/23/2016 1251   KETONESUR NEGATIVE 12/05/2019 1154   PROTEINUR NEGATIVE 12/05/2019 1154   UROBILINOGEN 0.2 11/23/2016 1251   NITRITE NEGATIVE 12/05/2019 1154   LEUKOCYTESUR NEGATIVE 12/05/2019 1154   LEUKOCYTESUR Negative 11/23/2016 1251    STUDIES: No results found.   ELIGIBLE FOR AVAILABLE RESEARCH PROTOCOL: no   ASSESSMENT: 50 y.o. [place] woman status post left breast upper outer quadrant and left axillary lymph node biopsy 06/19/2016, both positive for a clinical T2-T3 N1, stage IIIB-C invasive ductal carcinoma, grade 3, triple negative, with an MIB-1 of 90%  (1) genetics testing 08/04/2016 showed a variant of uncertain significance in the BRCA2 namely c.8169T>A (p.Asp2723Glu). This has been classified as likely pathogenic by [place] [facility] but not other  labs. Additional testing of the patient's mother and maternal aunt is pending. Otherwise [facility]'s Common Hereditary Cancers Panel found no deleterious mutations in APC, ATM, AXIN2, BARD1, BMPR1A, BRCA1, BRCA2, BRIP1, CDH1, CDKN2A, CHEK2, CTNNA1, DICER1, EPCAM, GREM1, HOXB13, KIT, MEN1, MLH1, MSH2, MSH3, MSH6, MUTYH, NBN, NF1, NTHL1, PALB2, PDGFRA, PMS2, POLD1, POLE, PTEN, RAD50, RAD51C, RAD51D, SDHA, SDHB, SDHC, SDHD, SMAD4, SMARCA4, STK11, TP53, TSC1, TSC2, and VHL.  (2) neoadjuvant chemotherapy consisting of doxorubicin and cyclophosphamide in dose dense fashion 4 completed 09/10/2016 followed by paclitaxel weekly 12 given with carboplatin   (a) cycle 4 of cyclophosphamide and doxorubicin was delayed 10 days and dose decreased 10% because of febrile neutropenia after cycle 3  (b) cycle 6 of Paclitaxel and Carboplatin delayed due to neutropenia, therefore Granix added to Wednesday,  Thursday, Friday following chemotherapy days  (3) status post left lumpectomy and left axillary sentinel lymph node sampling 01/27/2017 with pathology showing a complete pathologic response (ypT0 ypN0);   (a) 5 left axillary lymph nodes removed  (4) adjuvant radiation 03/11/2017-04/21/2017  Site/dose:   1. Left breast, 2 Gy in 25 fractions for a total dose of 50 Gy                      2. 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  (8) status post bilateral mastectomies 01/09/2020 showing  (a) on the right, no evidence of malignancy  (b) on the left, a pT2 pNX, stage IIB invasive ductal carcinoma, grade 3, with ample margins   (c) repeat prognostic panel on the left again weakly estrogen receptor positive, HER2 negative, progesterone receptor negative  (9) adjuvant therapy with cyclophosphamide, methotrexate, fluorouracil (CMF) given every 21 days x 8, started 02/19/2020  (a) cycle 2 and subsequent cycles held due to surgical wound dehiscence  (10) left chest wall wound dehiscence following mastectomy January 2022.   (A) status post hyperbaric treatment starting April 2022  (B) status post left chest wall debridement and flap 07/15/2020 with surgical pathology showed no evidence of tumor  (C)  hyperbaric treatment resumed July 2022  (11) foundation 1 requested on 01/09/2020 material found in addition to the BRCA2 mutation, a PTEN loss of exon 1 and RB1R 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  ERV B2 and PI K3 CA were negative, and the microsatellite status was stable, with low mutational burden (4/MB)  (12) anastrozole started 04/18/2020   (A) olaparib added 08/13/2020 and 150 mg once a day.  PLAN: Lariya continues to have significant problems with her left chest wounds, largely due to the devascularizing effects of prior radiation.  She is very disappointed with her current situation but hopes to continue to make slow progress and eventually have the wound heal.  We had patients in the past who received Avastin concurrently with radiation and had similar difficulties.  Those patients did eventually have the wounds close,  never achieving normalcy in that area but eventually becoming very functional.  All this has delayed her bilateral salpingo-oophorectomy and hopefully at some point this year or early next year she will be able to undergo that.  Despite all these problems overall she is feeling better, has more energy, and is able to work.  Today we updated her medications and I rewrote gabapentin and tramadol for her and discussed them in detail.    I also think we are ready to give olaparib a try.  We are going to start at a very low dose, 1 tablet daily, and I will see her again in about a month.  If she is tolerating that well we will increase the dose as tolerated.  Total encounter time 35 minutes.*   Linus Weckerly, [name], MD  08/13/20 6:49 PM Medical Oncology and Hematology [facility] [place], [place] [address] Tel. [phone]    Fax. [phone]   I, Wilburn Mylar, am acting as scribe for Dr. [name]. Haik Mahoney.  I, Lurline Del MD, have reviewed the above documentation for accuracy and completeness, and I agree with the above.   *Total Encounter Time  as defined by the Centers for Medicare and Medicaid Services includes, in addition to the face-to-face time of a patient visit (documented in the note above) non-face-to-face time: obtaining and reviewing outside history, ordering and reviewing medications, tests or procedures, care coordination (communications with other health care professionals or caregivers) and documentation in the medical record.

## 2020-08-13 NOTE — Progress Notes (Signed)
SHARALEE, WIEMAN ([id number]) Visit Report for 08/08/2020 Arrival Information Details Patient Name: Date of Service: [name] 08/08/2020 10:15 A M Medical Record Number: [id number] Patient Account Number: [id number] Date of Birth/Sex: Treating RN: [birth year] (50 y.o. [name], [name] Primary Care Kasyn Rolph: [name] Other Clinician: [name] Referring Anola Mcgough: Treating Carrieann Spielberg/Extender: [name] in Treatment: 97 Visit Information History Since Last Visit All ordered tests and consults were completed: Yes Patient Arrived: Ambulatory Added or deleted any medications: No Arrival Time: 09:30 Any new allergies or adverse reactions: No Accompanied By: self Had a fall or experienced change in No Transfer Assistance: None activities of daily living that may affect Patient Identification Verified: Yes risk of falls: Secondary Verification Process Completed: Yes Signs or symptoms of abuse/neglect since last visito No Patient Requires Transmission-Based Precautions: No Hospitalized since last visit: No Patient Has Alerts: No Implantable device outside of the clinic excluding No cellular tissue based products placed in the center since last visit: Pain Present Now: No Electronic Signature(s) Signed: 08/13/2020 11:04:06 AM By: [name] EMT Entered By: [name] on 08/08/2020 10:49:21 -------------------------------------------------------------------------------- Encounter Discharge Information Details Patient Name: Date of Service: [name] 08/08/2020 10:15 A M Medical Record Number: [id number] Patient Account Number: [id number] Date of Birth/Sex: Treating RN: 11/27/1970 (50 y.o. [name] Primary Care Tesla Bochicchio: [name] Other Clinician: [name] Referring Darcell Yacoub: Treating Khushboo Chuck/Extender: [name] in Treatment: 17 Encounter Discharge  Information Items Discharge Condition: Stable Ambulatory Status: Ambulatory Discharge Destination: Home Transportation: Private Auto Accompanied By: self Schedule Follow-up Appointment: No Clinical Summary of Care: Electronic Signature(s) Signed: 08/08/2020 2:23:47 PM By: [name] EMT Entered By: [name] on 08/08/2020 14:23:46 -------------------------------------------------------------------------------- Vitals Details Patient Name: Date of Service: [name] 08/08/2020 10:15 A M Medical Record Number: [id number] Patient Account Number: [id number] Date of Birth/Sex: Treating RN: 1970-11-30 (50 y.o. [name], [name] Primary Care Asher Babilonia: [name] Other Clinician: [name] Referring Selso Mannor: Treating Nurah Petrides/Extender: [name] in Treatment: 17 Vital Signs Time Taken: 09:52 Temperature (F): 98.3 Height (in): 67 Pulse (bpm): 90 Weight (lbs): 257 Respiratory Rate (breaths/min): 18 Body Mass Index (BMI): 40.2 Blood Pressure (mmHg): 135/86 Reference Range: 80 - 120 mg / dl Electronic Signature(s) Signed: 08/13/2020 11:04:06 AM By: [name] EMT Entered By: [name] on 08/08/2020 10:50:08

## 2020-08-13 NOTE — Progress Notes (Signed)
ILAINA, [name] ([id number]) Visit Report for 08/12/2020 Problem List Details Patient Name: Date of Service: [name] [name] 08/12/2020 10:00 A M Medical Record Number: [id number] Patient Account Number: [id number] Date of Birth/Sex: Treating RN: 05-27-1970 (50 y.o. 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 08/12/2020 Medical Record Number: [id number] Patient Account Number: [id number] Date of Birth/Sex: Treating RN: 01-19-1970 (50 y.o. 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 and  nipple Facility Procedures CPT4 Code: [id number] Description: G0277-(Facility Use Only) HBOT full body chamber, 2mn , ICD-10 Diagnosis Description T81.31XD Disruption of external operation (surgical) wound, not elsewhere classified, subse 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 and subcutaneous  tissue 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 Modifier: quent encounter Quantity: 4 Physician Procedures : CPT4 Code Description Modifier [id number] PHYS HYPERBARIC OXYGEN THERAPY ICD-10 Diagnosis Description T81.31XD Disruption of external operation (surgical) wound, not elsewhere classified, subsequent encounter L59.8 Other specified disorders of  the skin and subcutaneous tissue 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

## 2020-08-13 NOTE — Progress Notes (Signed)
DELANCEY, FARHAN ([id number]) Visit Report for 08/08/2020 HBO Details Patient Name: Date of Service: [name]. 08/08/2020 10:15 Sarah M Medical Record Number: [id number] Patient Account Number: [id number] Date of Birth/Sex: Treating RN: [birth year] (50 y.o. 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 HBO Treatment Details Treatment Number: 10 Patient Type: Outpatient Chamber Type: Multiplace Chamber Serial #: [id number] Treatment Protocol: 2.5 ATA with 90 minutes oxygen, with two 5 minute air breaks Treatment Details Compression Rate Down: 2.0 psi / minute De-Compression Rate Up: 2.5 psi / minute Sarah breaks and breathing ir Compress Tx Pressure periods Decompress Decompress Begins Reached (leave unused spaces Begins Ends blank) Chamber Pressure (ATA 1 2.5 2.5 2.5 2.5 2.5 - - 2.5 1 ) Clock Time (24 hr) 10:23 10:35 11:05 11:10 11:40 11:45 - - 12:15 12:24 Treatment Length: 121 (minutes) Treatment Segments: 4 Vital Signs Capillary Blood Glucose Reference Range: 80 - 120 mg / dl HBO Diabetic Blood Glucose Intervention Range: <131 mg/dl or >249 mg/dl Time Vitals Blood Respiratory Capillary Blood Glucose Pulse Action Type: Pulse: Temperature: Taken: Pressure: Rate: Glucose (mg/dl): Meter #: Oximetry (%) Taken: Pre 09:52 135/86 90 18 98.3 Post 12:26 116/82 78 18 97.9 Treatment Response Treatment Toleration: Well Treatment Completion Status: Treatment Completed without Adverse Event Adasyn Mcadams Notes Patient tolerated treatment well today with no issues or concerns. Physician HBO Attestation: I certify that I  supervised this HBO treatment in accordance with Medicare guidelines. Sarah trained emergency response team is readily available per Yes hospital policies and procedures. Continue HBOT as ordered. Yes Electronic Signature(s) Signed: 08/08/2020 5:31:46 PM By: Kalman Shan DO Previous Signature: 08/08/2020 2:13:49 PM Version By: [name] EMT Entered By: Kalman Shan on 08/08/2020 17:27:23 -------------------------------------------------------------------------------- HBO Safety Checklist Details Patient Name: Date of Service: [name] [name]. 08/08/2020 10:15 Sarah M Medical Record Number: [id number] Patient Account Number: [id number] Date of Birth/Sex: Treating RN: [birth year] (50 y.o. [name], [name] Primary Care Mallory Enriques: [name] Other Clinician: Donavan Khan Referring Radames Mejorado: Treating Daaiyah Baumert/Extender: [name] in Treatment: 17 HBO Safety Checklist Items Safety Checklist Consent Form Signed Patient voided / foley secured and emptied When did you last eato snack this AM 0915 Last dose of injectable or oral agent n/Sarah Ostomy pouch emptied and vented if applicable NA All implantable devices assessed, documented and approved NA Intravenous access site secured and place NA Valuables secured Linens and cotton and cotton/polyester blend (less than 51% polyester) Personal oil-based products / skin lotions / body lotions removed Wigs or hairpieces removed NA Smoking or tobacco materials removed NA Books / newspapers / magazines / loose paper removed Cologne, aftershave, perfume and deodorant removed Jewelry removed (may wrap wedding band) Make-up removed Hair care products removed Battery operated devices (external) removed Heating patches and chemical warmers removed Titanium eyewear removed NA Nail polish cured greater than 10 hours Casting material cured greater than 10 hours NA Hearing aids removed NA Loose  dentures or partials removed Prosthetics have been removed NA Patient demonstrates correct use of air break device (if applicable) Patient concerns have been addressed Patient grounding bracelet on and cord attached to chamber Specifics for Inpatients (complete in addition to above) Medication sheet  sent with patient NA Intravenous medications needed or due during therapy sent with patient NA Drainage tubes (e.g. nasogastric tube or chest tube secured and vented) NA Endotracheal or Tracheotomy tube secured NA Cuff deflated of air and inflated with saline NA Airway suctioned NA Electronic Signature(s) Signed: 08/13/2020 11:04:06 AM By: [name] EMT Entered By: [name] on 08/08/2020 10:51:19

## 2020-08-13 NOTE — Progress Notes (Signed)
LOUAN, MCMAIN ([id number]) Visit Report for 08/12/2020 HBO Details Patient Name: Date of Service: [name] 08/12/2020 10:00 A M Medical Record Number: [id number] Patient Account Number: [id number] Date of Birth/Sex: Treating RN: Dec 23, 1970 (50 y.o. 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 HBO Treatment Details Treatment Number: 12 Patient Type: Outpatient Chamber Type: Monoplace Chamber Serial #: [id number] Treatment Protocol: 2.5 ATA with 90 minutes oxygen, with two 5 minute air breaks Treatment Details Compression Rate Down: 2.0 psi / minute De-Compression Rate Up: 2.5 psi / minute A breaks and breathing ir Compress Tx Pressure periods Decompress Decompress Begins Reached (leave unused spaces Begins Ends blank) Chamber Pressure (ATA 1 2.5 2.5 2.5 2.5 2.5 - - 2.5 1 ) Clock Time (24 hr) 09:57 10:09 10:39 10:44 11:14 11:19 - - 11:49 11:59 Treatment Length: 122 (minutes) Treatment Segments: 4 Vital Signs Capillary Blood Glucose Reference Range: 80 - 120 mg / dl HBO Diabetic Blood Glucose Intervention Range: <131 mg/dl or >249 mg/dl Time Vitals Blood Respiratory Capillary Blood Glucose Pulse Action Type: Pulse: Temperature: Taken: Pressure: Rate: Glucose (mg/dl): Meter #: Oximetry (%) Taken: Pre 09:51 146/86 96 18 97.8 100 Post 12:00 128/84 80 18 97.7 100 Treatment Response Treatment Toleration: Well Treatment Completion Status: Treatment Completed without Adverse Event Jaterrius Ricketson Notes Patient tolerated treatment well today with no issues or concerns. Physician HBO Attestation: I certify  that I supervised this HBO treatment in accordance with Medicare guidelines. A trained emergency response team is readily available per Yes hospital policies and procedures. Continue HBOT as ordered. Yes Electronic Signature(s) Signed: 08/12/2020 5:58:46 PM By: Worthy Keeler PA-C Entered By: Worthy Keeler on 08/12/2020 17:58:45 -------------------------------------------------------------------------------- HBO Safety Checklist Details Patient Name: Date of Service: [name] 08/12/2020 10:00 A M Medical Record Number: [id number] Patient Account Number: [id number] Date of Birth/Sex: Treating RN: 05/25/1970 (50 y.o. [name] Primary Care Myson Levi: [name] Other Clinician: [name] Referring Rayni Nemitz: Treating Aamna Mallozzi/Extender: Randon Goldsmith in Treatment: 17 HBO Safety Checklist Items Safety Checklist Consent Form Signed Patient voided / foley secured and emptied When did you last eato snack 0915 Last dose of injectable or oral agent n/a Ostomy pouch emptied and vented if applicable NA All implantable devices assessed, documented and approved NA Intravenous access site secured and place NA Valuables secured Linens and cotton and cotton/polyester blend (less than 51% polyester) Personal oil-based products / skin lotions / body lotions removed Wigs or hairpieces removed NA Smoking or tobacco materials removed NA Books / newspapers / magazines / loose paper removed Cologne, aftershave, perfume and deodorant removed Jewelry removed (may wrap wedding band) Make-up removed Hair care products removed Battery operated devices (external) removed Heating patches and chemical warmers removed Titanium eyewear removed NA Nail polish cured greater than 10 hours Casting material cured greater than 10 hours NA Hearing aids removed NA Loose dentures or partials removed Prosthetics have been removed NA Patient  demonstrates correct use of air break device (if applicable) Patient concerns have been addressed Patient grounding bracelet on and cord attached to chamber Specifics for Inpatients (complete in addition to above) Medication sheet sent with patient NA Intravenous medications  needed or due during therapy sent with patient NA Drainage tubes (e.g. nasogastric tube or chest tube secured and vented) NA Endotracheal or Tracheotomy tube secured NA Cuff deflated of air and inflated with saline NA Airway suctioned NA Electronic Signature(s) Signed: 08/13/2020 11:04:06 AM By: [name] EMT Entered By: [name] on 08/12/2020 11:48:32

## 2020-08-13 NOTE — Progress Notes (Addendum)
[name], [name] ([id number]) Visit Report for 08/13/2020 HBO Details Patient Name: Date of Service: [name] 08/13/2020 10:00 A M Medical Record Number: [id number] Patient Account Number: [id number] Date of Birth/Sex: Treating RN: 1970/02/27 (50 y.o. 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 HBO Treatment Details Treatment Number: 13 Patient Type: Outpatient Chamber Type: Monoplace Chamber Serial #: [id number] Treatment Protocol: 2.5 ATA with 90 minutes oxygen, with two 5 minute air breaks Treatment Details Compression Rate Down: 2.0 psi / minute De-Compression Rate Up: 2.5 psi / minute A breaks and breathing ir Compress Tx Pressure periods Decompress Decompress Begins Reached (leave unused spaces Begins Ends blank) Chamber Pressure (ATA 1 2.5 2.5 2.5 2.5 2.5 - - 2.5 1 ) Clock Time (24 hr) 10:33 10:45 11:15 11:20 11:50 11:55 - - 12:25 12:34 Treatment Length: 121 (minutes) Treatment Segments: 4 Vital Signs Capillary Blood Glucose Reference Range: 80 - 120 mg / dl HBO Diabetic Blood Glucose Intervention Range: <131 mg/dl or >249 mg/dl Time Vitals Blood Respiratory Capillary Blood Glucose Pulse Action Type: Pulse: Temperature: Taken: Pressure: Rate: Glucose (mg/dl): Meter #: Oximetry (%) Taken: Pre 10:29 136/61 89 18 98.1 100 Post 12:37 111/50 74 18 97.9 Treatment Response Treatment Toleration: Well Treatment Completion Status: Treatment Completed without Adverse Event Deloras Reichard Notes Patient tolerated treatment well today Electronic Signature(s) Signed: 08/13/2020 2:10:39 PM By: [name] EMT Signed: 08/15/2020 5:47:57 PM By: Worthy Keeler PA-C Entered By: [name] on 08/13/2020 14:10:39 -------------------------------------------------------------------------------- HBO Safety Checklist Details Patient Name: Date of Service: Sarah Crochet V [name] 08/13/2020 10:00 A M Medical Record Number: [id number] Patient Account Number: [id number] Date of Birth/Sex: Treating RN: [birth year] (50 y.o. [name], [name] Primary Care Aizlyn Schifano: [name] Other Clinician: [name] Referring Lynda Wanninger: Treating Blaklee Shores/Extender: Randon Goldsmith in Treatment: 18 HBO Safety Checklist Items Safety Checklist Consent Form Signed Patient voided / foley secured and emptied When did you last eato Snack approx 0915 Last dose of injectable or oral agent n/a Ostomy pouch emptied and vented if applicable NA All implantable devices assessed, documented and approved NA Intravenous access site secured and place NA Valuables secured Linens and cotton and cotton/polyester blend (less than 51% polyester) Personal oil-based products / skin lotions / body lotions removed Wigs or hairpieces removed NA Smoking or tobacco materials removed NA Books / newspapers / magazines / loose paper removed Cologne, aftershave, perfume and deodorant removed Jewelry removed (may wrap wedding band) Make-up removed Hair care products removed Battery operated devices (external) removed NA Heating patches and chemical warmers removed NA Titanium eyewear removed NA Nail polish cured greater than 10 hours Casting material cured greater than 10 hours NA Hearing aids removed NA Loose dentures or partials removed Prosthetics have been removed NA Patient demonstrates correct use of air break device (if applicable) Patient concerns have been addressed Patient grounding bracelet on and cord attached to chamber Specifics for Inpatients (complete in  addition to above) Medication sheet sent with patient NA Intravenous medications needed or due during therapy sent with patient NA Drainage tubes (e.g. nasogastric tube or chest tube secured and vented) NA Endotracheal or Tracheotomy tube secured NA Cuff deflated of air  and inflated with saline NA Airway suctioned NA Electronic Signature(s) Signed: 08/13/2020 2:08:25 PM By: [name] EMT Entered By: [name] on 08/13/2020 14:08:25

## 2020-08-13 NOTE — Progress Notes (Addendum)
DERRICK, ORRIS ([id number]) Visit Report for 08/13/2020 Chief Complaint Document Details Patient Name: Date of Service: [name] 08/13/2020 9:00 A M Medical Record Number: [id number] Patient Account Number: [id number] Date of Birth/Sex: Treating RN: December 06, 1970 (50 y.o. [name], [name] Primary Care Provider: [name] Other Clinician: Referring Provider: Treating Provider/Extender: Randon Goldsmith in Treatment: 18 Information Obtained from: Patient Chief Complaint 04/09/2020; patient is here for review of an area of wound dehiscence in the left breast mastectomy scar Electronic Signature(s) Signed: 08/13/2020 9:12:59 AM By: Worthy Keeler PA-C Entered By: Worthy Keeler on 08/13/2020 09:12:58 -------------------------------------------------------------------------------- HPI Details Patient Name: Date of Service: [name] 08/13/2020 9:00 A M Medical Record Number: [id number] Patient Account Number: [id number] Date of Birth/Sex: Treating RN: 1970-03-23 (50 y.o. [name], [name] Primary Care Provider: [name] Other Clinician: Referring Provider: Treating Provider/Extender: Randon Goldsmith in Treatment: 18 History of Present Illness HPI Description: ADMISSION 04/09/2020 This is a 50 year old woman who is history of breast cancer in the left breast goes back to 2018. He was found to have a mass in her left breast. She underwent a left back lumpectomy and then subsequent chemo that she did not tolerate very well. She had seed implants placed but then underwent external beam a radiation in the spring 2019. 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 Her adjunctive radiation was from 03/11/2017 through 04/21/2017. Unfortunately she had a left breast biopsy on 11/24/2019  and it was again positive for an invasive ductal carcinoma grade 3 he is to resume at receptor 80% positive. HER-2 negative progesterone negative. She opted for bilateral mastectomies which were done on 01/09/2020 on the right with no evidence of malignancy on the left stage IIb invasive ductal carcinoma. She again received chemotherapy but she did not tolerate it because of wound dehiscence and low platelet counts according to the patient. Unfortunately her mastectomy wound dehisced on the left breast. She was taken back to the OR by [name] of general surgery on 33/24/22 and she underwent a debridement and subsequent attempt at closure. She had been previously treated with Augmentin for suspected underlying infection. Once again the wound has dehisced in the mid aspect. It is roughly 1 cm deep there is tunnels both medially and laterally she. She still has sutures in place. Still describes constant pain drainage. She also has a drain still in place that drains about 10 cc/day. Currently she is changing ABD pad several times a day 4/12; patient went back to see [name] who remove most of the stitches and debrided her wound. She has been using silver alginate culture I did last time which was a PCR culture showed Peptostreptococcus, Enterococcus faecalis and Staphylococcus epidermidis which is of questionable significance. She has a larger wound now that the sutures have been removed. There are undermining areas superior medial superior lateral and a deep tunneling area in the inferior part of the wound. There is no obvious purulence. I started her on Augmentin on 04/12/2020 and I will likely extend this for another week AMAZINGLY; she is already been approved for HBO and will start today. [name] Echocardiogram which I reviewed from 2019 showed a normal ejection fraction.. I went over side effects of HBO with the patient and  answered any of her questions. Her husband was  present 5/3; its been a while since have seen this wound. She went to see [name] about 2-1/2 weeks ago according to the patient he can remove some of the surrounding debris. She will see him again later this week or early next week. She has been using silver alginate. A lot of this is problematic since she has Medicaid which traditionally does not cover wound care supplies although it traditionally does not cover HBO either Her wound actually looks quite a bit better than the last time I saw this better looking surface healthy looking tissue. She has tunnels at 810 2 in 6 the deepest of these is at 2 at 2.2 cm there is no exposed bone. 5/10; the patient was coming in for nurse visit today but requested that I see the wound because of surface discoloration. She states the Iodoflex makes the surface of this turned "white". She is not really complaining of undue tenderness. She is changing this every second day. The goal of this had been to get the surface of the wound to a vibrant red color and then potentially look at changing to a silver collagen-based dressing, Puraply, and then perhaps an alternative skin substituteo Derma vest. The patient tells me she has an appoint with her oncologist [name] on the 19th. She apparently think she is going to get another PET scan. Based on this I would like his comment about whether there is possibility of any malignancy in the wound itself or just underneath it. Up to now we have not really thought that was true 5/17; nice improvement in the chest wound. Less depth. We have been using Iodoflex to clean off the wound surface this seems to have done nicely. Patient went to see [name] 2 days ago who has referred her to see Dr. Claudia Desanctis. I certainly agree with the thought the surface of the wound looks a lot better now and I think consideration of plastic surgery is certainly something the patient should discuss. I have talked to the patient about this  today and I certainly support a conversation with Dr. Claudia Desanctis. I also wondered about is there any residual cancer in this. [name] apparently did not seem to think so per the patient although I do not see that in the text conversations we have had. I also wonder about a skin substitute if her version of Medicare [[employer]] would cover this. [name] is apparently considering starting her on [place]. Patient is already very scared about the side effects but she will discuss this with [name] on 5/19 5/31; patient's wound about the same in terms of size she still has the tunneling area to the left. Surface of the wound looks a lot better there has been some filling in the granulation I have been included on serial text between Dr. Callie Fielding surgery] Dr. Ruffin Frederick oncology] and Dr. Para Skeans surgery]. I think there is not a lot of thought that the patient has recurrence of her cancer her MRI was indeterminant but [name] did not feel that there was any reason to think she has a major recurrence and he was not in favor of doing a dedicated breast MRI. Dr. Claudia Desanctis wants to go ahead with a flap closure I did it. I have not reviewed his note but per the patient this is a latissimus dorsi myocutaneous flap or I think a skin flap from her abdomen. I have talked to  the patient I think it is time to attempt this. If I can continue hyperbarics surrounding the flap then we will attempt to do this 6/14; patient using Prisma as the primary dressing here. She has completed hyperbarics and did very well. Her wound is responded nicely. The depth of this and the condition of the granulation tissue looks a lot better. She has undermining areas at 2 and 10:00 both of these seem to be better as well. She is scheduled for flap closure on 7/11 by Dr. Claudia Desanctis 6/29; the patient continues to use Prisma wet-to-dry. Her graft is scheduled for 7/11 by Dr. Claudia Desanctis. We are looking at getting her additional  hyperbaric treatment which I think might help with the likelihood of graft survival in this difficult irradiated area 7/21; patient returns to clinic to reinitiate HBO after her surgical procedure by Dr. Claudia Desanctis. On 07/15/2020 she had debridement of left chest wound and a rotational flap closure. She also had excision of right excess axillary tissue. Pathology did not show any malignancy and follow-up Dr. Claudia Desanctis saw the patient yesterday. He noted a 2 cm area of wound dehiscence. Recommended she return for hyperbaric oxygen and she returns today for her first treatment. Follow-up with him in 2 weeks. We were not actually going to see the patient today her appointment to be evaluated was Monday however she was concerned about a "fishy odor". She is also noted some drainage. Not specifically dressing in the wound area but covering with Adaptic 7/25; I am seeing this wound and follow-up. So far her culture is polymicrobial not growing any specific anything specific nevertheless with the Augmentin and the silver alginate her odor is a lot better. Not as much periwound tenderness. Dimensions measuring 2.7 x 3.3 x 1.6. She has undermining from 10-1 o'clock of about 1 and half centimeters. Necrotic fat on the surface of this... 08/13/2020 upon evaluation today patient appears to be doing decently well in regard to her wound all things considered. Obviously this is much more open than what was hoped for based on the fact that she had a flap performed which was supposed to completely close this wound. Nonetheless currently she tells me that she is not really having any significant pain fortunately the drainage is improved compared to last time there is still some evidence of odor as far as the drainage is concerned but is definitely not as purulent as what was previously noted. With that being said I do believe that there are quite a few sutures which are quite embedded. Nonetheless I believe this needs to be removed.  In fact if they are not I am afraid this is again actually grow into the skin and that is can to be even bigger issue. Some of them even so at this point appeared that there could be difficult to even get out. Electronic Signature(s) Signed: 08/14/2020 8:12:41 AM By: Worthy Keeler PA-C Entered By: Worthy Keeler on 08/14/2020 08:12:41 -------------------------------------------------------------------------------- Physical Exam Details Patient Name: Date of Service: [name], [name] 08/13/2020 9:00 A M Medical Record Number: [id number] Patient Account Number: [id number] Date of Birth/Sex: Treating RN: 1970-08-02 (50 y.o. Benjaman Lobe Primary Care Provider: [name] Other Clinician: Referring Provider: Treating Provider/Extender: Randon Goldsmith in Treatment: 41 Constitutional Well-nourished and well-hydrated in no acute distress. Respiratory normal breathing without difficulty. Psychiatric this patient is able to make decisions and demonstrates good insight into disease process. Alert and Oriented x 3.  pleasant and cooperative. Notes Upon inspection patient's wound actually has quite a bit of depth noted at this point. There does not appear to be any signs of active infection which is great news. With that being said the surface of the wound actually appears to be quite clean and I am very pleased with that. She does have sutures around the edges of the wound which I think are getting need to be removed. I discussed that with her today and she is definitely in agreement with that plan they are not really doing anything at this point anyway but they are starting to grow into the skin and the skin to grow over then. She also has a spitting suture down at the bottom of her surgical site towards the abdominal region that is can also need to be removed. I did perform the suture removal at all locations including the extraction of the spitting suture  as well and post removal things did appear to be doing well there were a couple areas where she had minimal bleeding but nothing too significant here to be honest. Overall I think it went decently well. With that being said with regard to the wound itself I think she is going to require a wound VAC to try to get this healed as quickly as possible her chemotherapy is on hold until she can get the wound healed apparently. Electronic Signature(s) Signed: 08/14/2020 8:14:46 AM By: Worthy Keeler PA-C Entered By: Worthy Keeler on 08/14/2020 08:14:46 -------------------------------------------------------------------------------- Physician Orders Details Patient Name: Date of Service: [name] 08/13/2020 9:00 A M Medical Record Number: [id number] Patient Account Number: [id number] Date of Birth/Sex: Treating RN: 03/14/1970 (50 y.o. 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 Bathing/ Shower/ Hygiene May shower with protection but do not get wound dressing(s) wet. Negative Presssure Wound Therapy Wound Vac to wound continuously at 115m/hg pressure Black and White Foam combination - White foam to undermining/tunnel. Other: - We will order a wound vac today. We will apply in clinic once it comes in, be sure to bring it with you as it will be delivered to your house. Wound Treatment Wound #1 - Chest Wound Laterality:  Left Cleanser: Wound Cleanser 1 x Per Day/15 Days Discharge Instructions: Cleanse the wound with wound cleanser prior to applying a clean dressing using gauze sponges, not tissue or cotton balls. Prim Dressing: Promogran Prisma Matrix, 4.34 (sq in) (silver collagen) 1 x Per Day/15 Days ary Discharge Instructions: Moisten collagen with saline or hydrogel. Pack prisma into small area at bottom of incision line. Apply dakin's moistened gauze over prisma. Secondary Dressing: Woven Gauze Sponge, Non-Sterile 4x4 in 1 x Per Day/15 Days Discharge Instructions: Apply over primary dressing as directed. Secondary Dressing: ABD Pad, 8x10 1 x Per Day/15 Days Discharge Instructions: Apply over primary dressing as directed. Patient Medications llergies: carboplatin A Notifications Medication Indication Start End 08/13/2020 Dakin's Solution DOSE miscellaneous 0.25 % solution - Moisten gauze with Dakin's solution then wring out leaving gauze damp not saturated before packing into  the wound bed lightly as directed Electronic Signature(s) Signed: 08/13/2020 5:42:04 PM By: Worthy Keeler PA-C Previous Signature: 08/13/2020 5:35:30 PM Version By: Rhae Hammock RN Entered By: Worthy Keeler on 08/13/2020 17:42:04 -------------------------------------------------------------------------------- Problem List Details Patient Name: Date of Service: [name] 08/13/2020 9:00 A M Medical Record Number: [id number] Patient Account Number: [id number] Date of Birth/Sex: Treating RN: 1970/02/04 (50 y.o. 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 08/13/2020 9:00 A M Medical Record Number: [id number] Patient Account Number: [id number] Date of Birth/Sex: Treating RN: [birth year] (50 y.o. [name], [name] Primary Care Provider: [name] Other Clinician: Referring Provider: Treating Provider/Extender: Randon Goldsmith in Treatment: 18 Subjective Chief Complaint Information obtained from Patient 04/09/2020; patient is here for review of an area of wound dehiscence in the left breast mastectomy scar History of Present Illness (HPI) ADMISSION 04/09/2020 This is a 50 year old woman who is history of breast cancer in the left breast goes back to 2018. He was found to have a mass in her left breast. She underwent a left back lumpectomy and then subsequent chemo that she did not tolerate very well. She had seed implants placed but then underwent external beam a radiation in the spring 2019. 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 Her adjunctive radiation was from 03/11/2017 through 04/21/2017. Unfortunately she had a left breast biopsy on 11/24/2019 and it was again positive for an invasive ductal carcinoma grade 3 he is to resume at receptor 80% positive. HER-2 negative progesterone negative. She opted for bilateral mastectomies  which were done on 01/09/2020 on the right with no evidence of malignancy on the left stage IIb invasive ductal carcinoma. She again received chemotherapy but she did not tolerate it because of wound dehiscence and low platelet counts according to the patient. Unfortunately her mastectomy wound dehisced on the left breast. She was taken back to the OR by [name] of general surgery on 33/24/22 and she underwent a debridement and subsequent attempt at closure. She had been previously treated with Augmentin for suspected underlying infection. Once again the wound has dehisced in the mid aspect. It is roughly 1 cm deep there is tunnels both medially and laterally she. She still has sutures in place. Still describes constant pain drainage. She also has a drain still in place that drains about 10 cc/day. Currently she is changing ABD pad several times a day 4/12; patient went back to see [name] who remove most of the stitches and debrided her wound. She has been using silver alginate culture I did last time which was a PCR culture showed Peptostreptococcus, Enterococcus faecalis and Staphylococcus epidermidis which  is of questionable significance. She has a larger wound now that the sutures have been removed. There are undermining areas superior medial superior lateral and a deep tunneling area in the inferior part of the wound. There is no obvious purulence. I started her on Augmentin on 04/12/2020 and I will likely extend this for another week AMAZINGLY; she is already been approved for HBO and will start today. [name] Echocardiogram which I reviewed from 2019 showed a normal ejection fraction.. I went over side effects of HBO with the patient and answered any of her questions. Her husband was present 5/3; its been a while since have seen this wound. She went to see [name] about 2-1/2 weeks ago according to the patient he can remove some of the surrounding debris. She  will see him again later this week or early next week. She has been using silver alginate. A lot of this is problematic since she has Medicaid which traditionally does not cover wound care supplies although it traditionally does not cover HBO either Her wound actually looks quite a bit better than the last time I saw this better looking surface healthy looking tissue. She has tunnels at 810 2 in 6 the deepest of these is at 2 at 2.2 cm there is no exposed bone. 5/10; the patient was coming in for nurse visit today but requested that I see the wound because of surface discoloration. She states the Iodoflex makes the surface of this turned "white". She is not really complaining of undue tenderness. She is changing this every second day. The goal of this had been to get the surface of the wound to a vibrant red color and then potentially look at changing to a silver collagen-based dressing, Puraply, and then perhaps an alternative skin substituteo Derma vest. The patient tells me she has an appoint with her oncologist [name] on the 19th. She apparently think she is going to get another PET scan. Based on this I would like his comment about whether there is possibility of any malignancy in the wound itself or just underneath it. Up to now we have not really thought that was true 5/17; nice improvement in the chest wound. Less depth. We have been using Iodoflex to clean off the wound surface this seems to have done nicely. Patient went to see [name] 2 days ago who has referred her to see Dr. Claudia Desanctis. I certainly agree with the thought the surface of the wound looks a lot better now and I think consideration of plastic surgery is certainly something the patient should discuss. I have talked to the patient about this today and I certainly support a conversation with Dr. Claudia Desanctis. I also wondered about is there any residual cancer in this. [name] apparently did not seem to think so per  the patient although I do not see that in the text conversations we have had. I also wonder about a skin substitute if her version of Medicare [[employer]] would cover this. [name] is apparently considering starting her on [place]. Patient is already very scared about the side effects but she will discuss this with [name] on 5/19 5/31; patient's wound about the same in terms of size she still has the tunneling area to the left. Surface of the wound looks a lot better there has been some filling in the granulation I have been included on serial text between Dr. Callie Fielding surgery] Dr. Ruffin Frederick oncology] and Dr. Claudia Desanctis [  plastic surgery]. I think there is not a lot of thought that the patient has recurrence of her cancer her MRI was indeterminant but [name] did not feel that there was any reason to think she has a major recurrence and he was not in favor of doing a dedicated breast MRI. Dr. Claudia Desanctis wants to go ahead with a flap closure I did it. I have not reviewed his note but per the patient this is a latissimus dorsi myocutaneous flap or I think a skin flap from her abdomen. I have talked to the patient I think it is time to attempt this. If I can continue hyperbarics surrounding the flap then we will attempt to do this 6/14; patient using Prisma as the primary dressing here. She has completed hyperbarics and did very well. Her wound is responded nicely. The depth of this and the condition of the granulation tissue looks a lot better. She has undermining areas at 2 and 10:00 both of these seem to be better as well. She is scheduled for flap closure on 7/11 by Dr. Claudia Desanctis 6/29; the patient continues to use Prisma wet-to-dry. Her graft is scheduled for 7/11 by Dr. Claudia Desanctis. We are looking at getting her additional hyperbaric treatment which I think might help with the likelihood of graft survival in this difficult irradiated area 7/21; patient returns to clinic to reinitiate HBO after  her surgical procedure by Dr. Claudia Desanctis. On 07/15/2020 she had debridement of left chest wound and a rotational flap closure. She also had excision of right excess axillary tissue. Pathology did not show any malignancy and follow-up Dr. Claudia Desanctis saw the patient yesterday. He noted a 2 cm area of wound dehiscence. Recommended she return for hyperbaric oxygen and she returns today for her first treatment. Follow-up with him in 2 weeks. We were not actually going to see the patient today her appointment to be evaluated was Monday however she was concerned about a "fishy odor". She is also noted some drainage. Not specifically dressing in the wound area but covering with Adaptic 7/25; I am seeing this wound and follow-up. So far her culture is polymicrobial not growing any specific anything specific nevertheless with the Augmentin and the silver alginate her odor is a lot better. Not as much periwound tenderness. Dimensions measuring 2.7 x 3.3 x 1.6. She has undermining from 10-1 o'clock of about 1 and half centimeters. Necrotic fat on the surface of this... 08/13/2020 upon evaluation today patient appears to be doing decently well in regard to her wound all things considered. Obviously this is much more open than what was hoped for based on the fact that she had a flap performed which was supposed to completely close this wound. Nonetheless currently she tells me that she is not really having any significant pain fortunately the drainage is improved compared to last time there is still some evidence of odor as far as the drainage is concerned but is definitely not as purulent as what was previously noted. With that being said I do believe that there are quite a few sutures which are quite embedded. Nonetheless I believe this needs to be removed. In fact if they are not I am afraid this is again actually grow into the skin and that is can to be even bigger issue. Some of them even so at this point appeared that there  could be difficult to even get out. Objective Constitutional Well-nourished and well-hydrated in no acute distress. Vitals Time Taken: 9:15 AM, Height: 67  in, Weight: 257 lbs, BMI: 40.2, Temperature: 98.7 F, Pulse: 96 bpm, Respiratory Rate: 18 breaths/min, Blood Pressure: 163/79 mmHg. Respiratory normal breathing without difficulty. Psychiatric this patient is able to make decisions and demonstrates good insight into disease process. Alert and Oriented x 3. pleasant and cooperative. General Notes: Upon inspection patient's wound actually has quite a bit of depth noted at this point. There does not appear to be any signs of active infection which is great news. With that being said the surface of the wound actually appears to be quite clean and I am very pleased with that. She does have sutures around the edges of the wound which I think are getting need to be removed. I discussed that with her today and she is definitely in agreement with that plan they are not really doing anything at this point anyway but they are starting to grow into the skin and the skin to grow over then. She also has a spitting suture down at the bottom of her surgical site towards the abdominal region that is can also need to be removed. I did perform the suture removal at all locations including the extraction of the spitting suture as well and post removal things did appear to be doing well there were a couple areas where she had minimal bleeding but nothing too significant here to be honest. Overall I think it went decently well. With that being said with regard to the wound itself I think she is going to require a wound VAC to try to get this healed as quickly as possible her chemotherapy is on hold until she can get the wound healed apparently. Integumentary (Hair, Skin) Wound #1 status is Open. Original cause of wound was Surgical Injury. The date acquired was: 01/16/2020. The wound has been in treatment 18 weeks.  The wound is located on the Left Chest. The wound measures 3cm length x 3.2cm width x 2.1cm depth; 7.54cm^2 area and 15.834cm^3 volume. There is Fat Layer (Subcutaneous Tissue) exposed. There is no undermining noted, however, there is tunneling at 10:00 with a maximum distance of 0.5cm. There is a large amount of serosanguineous drainage noted. The wound margin is distinct with the outline attached to the wound base. There is medium (34-66%) red, pink granulation within the wound bed. There is a medium (34-66%) amount of necrotic tissue within the wound bed including Adherent Slough. 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 Bathing/ Shower/ Hygiene: May shower with protection but do not get wound dressing(s) wet. Negative Presssure Wound Therapy: Wound Vac to wound continuously at 143m/hg pressure Black and White Foam combination - White foam to undermining/tunnel. Other: - We will order a wound vac today. We will apply in clinic once it comes in, be sure to bring it with you as it will be delivered to your house. The following medication(s) was prescribed: Dakin's Solution miscellaneous 0.25 % solution Moisten gauze with Dakin's solution then wring out leaving gauze damp not saturated before packing into the wound bed lightly as directed starting 08/13/2020 WOUND #1: - Chest Wound Laterality: Left Cleanser: Wound Cleanser 1 x Per Day/15 Days Discharge Instructions: Cleanse the wound with wound cleanser prior to applying a clean dressing using gauze sponges, not tissue or cotton balls.  Prim Dressing: Promogran Prisma Matrix, 4.34 (sq in) (silver collagen) 1 x Per Day/15  Days ary Discharge Instructions: Moisten collagen with saline or hydrogel. Pack prisma into small area at bottom of incision line. Apply dakin's moistened gauze over prisma. Secondary Dressing: Woven Gauze Sponge, Non-Sterile 4x4 in 1 x Per Day/15 Days Discharge Instructions: Apply over primary dressing as directed. Secondary Dressing: ABD Pad, 8x10 1 x Per Day/15 Days Discharge Instructions: Apply over primary dressing as directed. 1. Would recommend currently that we go ahead and put in the order for a wound VAC I think the sooner that we can get this done the better in my opinion. 2. I am also can recommend at this time that we have the patient go ahead and switch to Dakin's moistened gauze packing and behind the collagen I am okay with still doing the collagen to the base of the wound but again the Dakin's will help kill some of the bacteria and subsequently help with some of the odor as well which I think would be beneficial for her. This is just until we get approval for the wound VAC. 3. I would also recommend that we continue with hyperbaric oxygen therapy as previously initiated I think this is still the best way to go. Coupled with the wound VAC I think we have a very good chance of getting this to fill in and hopefully he will. The patient is obviously frustrated but again I want to do what we can to try to get things completed for here so she needs to get back to the chemotherapy she is supposed to be undergoing right now. We will see patient back for reevaluation in 1 week here in the clinic. If anything worsens or changes patient will contact our office for additional recommendations. Electronic Signature(s) Signed: 08/14/2020 8:15:53 AM By: Worthy Keeler PA-C Entered By: Worthy Keeler on 08/14/2020 08:15:53 -------------------------------------------------------------------------------- SuperBill Details Patient Name: Date of Service: [name]  08/13/2020 Medical Record Number: [id number] Patient Account Number: [id number] Date of Birth/Sex: Treating RN: 01-19-1970 (50 y.o. 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

## 2020-08-14 ENCOUNTER — Other Ambulatory Visit (HOSPITAL_COMMUNITY): Payer: Self-pay

## 2020-08-14 ENCOUNTER — Encounter (HOSPITAL_BASED_OUTPATIENT_CLINIC_OR_DEPARTMENT_OTHER): Payer: Medicaid Other | Admitting: Physician Assistant

## 2020-08-14 DIAGNOSIS — C50412 Malignant neoplasm of upper-outer quadrant of left female breast: Secondary | ICD-10-CM | POA: Diagnosis not present

## 2020-08-14 NOTE — Progress Notes (Signed)
VERLINE, ARDUINI ([id number]) Visit Report for 08/14/2020 Arrival Information Details Patient Name: Date of Service: [name] 08/14/2020 8:00 A M Medical Record Number: [id number] Patient Account Number: [id number] Date of Birth/Sex: Treating RN: 1970-09-08 (50 y.o. [name], [name] Primary Care Seichi Kaufhold: [name] Other Clinician: [name] Referring Yashar Inclan: Treating Caily Rakers/Extender: Randon Goldsmith in Treatment: 18 Visit Information History Since Last Visit All ordered tests and consults were completed: Yes Patient Arrived: Ambulatory Added or deleted any medications: No Arrival Time: 08:15 Any new allergies or adverse reactions: No Accompanied By: self Had a fall or experienced change in No Transfer Assistance: None activities of daily living that may affect Patient Identification Verified: Yes risk of falls: Secondary Verification Process Completed: Yes Signs or symptoms of abuse/neglect since last visito No Patient Requires Transmission-Based Precautions: No Hospitalized since last visit: No Patient Has Alerts: No Implantable device outside of the clinic excluding No cellular tissue based products placed in the center since last visit: Pain Present Now: No Electronic Signature(s) Signed: 08/14/2020 2:56:38 PM By: [name] EMT Entered By: [name] on 08/14/2020 10:20:38 -------------------------------------------------------------------------------- Encounter Discharge Information Details Patient Name: Date of Service: [name] 08/14/2020 8:00 A M Medical Record Number: [id number] Patient Account Number: [id number] Date of Birth/Sex: Treating RN: [birth year] (50 y.o. [name] Primary Care Jedaiah Rathbun: [name] Other Clinician: [name] Referring Izick Gasbarro: Treating Rainn Bullinger/Extender: Randon Goldsmith in Treatment: 18 Encounter  Discharge Information Items Discharge Condition: Stable Ambulatory Status: Ambulatory Discharge Destination: Home Transportation: Private Auto Accompanied By: self Schedule Follow-up Appointment: No Clinical Summary of Care: Electronic Signature(s) Signed: 08/14/2020 2:45:56 PM By: [name] EMT Entered By: [name] on 08/14/2020 14:45:55 -------------------------------------------------------------------------------- Vitals Details Patient Name: Date of Service: [name] 08/14/2020 8:00 A M Medical Record Number: [id number] Patient Account Number: [id number] Date of Birth/Sex: Treating RN: 1970/11/20 (50 y.o. [name] Primary Care Arion Morgan: [name] Other Clinician: [name] Referring Nahmir Zeidman: Treating Zurie Platas/Extender: Randon Goldsmith in Treatment: 18 Vital Signs Time Taken: 08:28 Temperature (F): 98.2 Height (in): 67 Pulse (bpm): 93 Weight (lbs): 257 Respiratory Rate (breaths/min): 18 Body Mass Index (BMI): 40.2 Blood Pressure (mmHg): 122/66 Reference Range: 80 - 120 mg / dl Airway Pulse Oximetry (%): 100 Electronic Signature(s) Signed: 08/14/2020 2:56:38 PM By: [name] EMT Entered By: [name] on 08/14/2020 10:21:14

## 2020-08-14 NOTE — Progress Notes (Addendum)
ASTER, DAUER ([id number]) Visit Report for 08/13/2020 Arrival Information Details Patient Name: Date of Service: [name] [name] 08/13/2020 10:00 Sarah M Medical Record Number: [id number] Patient Account Number: [id number] Date of Birth/Sex: Treating RN: [birth year] (50 y.o. [name], [name] Primary Care Juanisha Bautch: [name] Other Clinician: [name] Referring Jodean Valade: Treating Drystan Reader/Extender: Randon Goldsmith in Treatment: 18 Visit Information History Since Last Visit All ordered tests and consults were completed: Yes Patient Arrived: Ambulatory Added or deleted any medications: No Arrival Time: 09:40 Any new allergies or adverse reactions: No Accompanied By: self Had Sarah fall or experienced change in No Transfer Assistance: None activities of daily living that may affect Patient Identification Verified: Yes risk of falls: Secondary Verification Process Completed: Yes Signs or symptoms of abuse/neglect since last visito No Patient Requires Transmission-Based Precautions: No Hospitalized since last visit: No Patient Has Alerts: No Implantable device outside of the clinic excluding No cellular tissue based products placed in the center since last visit: Pain Present Now: No Electronic Signature(s) Signed: 08/13/2020 2:06:43 PM By: [name] EMT Entered By: [name] on 08/13/2020 14:06:42 -------------------------------------------------------------------------------- Encounter Discharge Information Details Patient Name: Date of Service: Sarah Crochet V [name] 08/13/2020 10:00 Sarah M Medical Record Number: [id number] Patient Account Number: [id number] Date of Birth/Sex: Treating RN: October 02, 1970 (50 y.o. [name], [name] Primary Care Kiely Cousar: [name] Other Clinician: [name] Referring Sal Spratley: Treating Abigail Marsiglia/Extender: Randon Goldsmith in Treatment: 18 Encounter  Discharge Information Items Discharge Condition: Stable Ambulatory Status: Ambulatory Discharge Destination: Home Transportation: Private Auto Accompanied By: self Schedule Follow-up Appointment: No Clinical Summary of Care: Electronic Signature(s) Signed: 08/13/2020 2:11:44 PM By: [name] EMT Entered By: [name] on 08/13/2020 14:11:43 -------------------------------------------------------------------------------- Vitals Details Patient Name: Date of Service: Sarah Crochet V [name] 08/13/2020 10:00 Sarah M Medical Record Number: [id number] Patient Account Number: [id number] Date of Birth/Sex: Treating RN: [birth year] (50 y.o. [name], [name] Primary Care Grover Robinson: [name] Other Clinician: [name] Referring Letisha Yera: Treating Ethelwyn Gilbertson/Extender: Randon Goldsmith in Treatment: 18 Vital Signs Time Taken: 10:29 Temperature (F): 98.1 Height (in): 67 Pulse (bpm): 89 Weight (lbs): 257 Respiratory Rate (breaths/min): 18 Body Mass Index (BMI): 40.2 Blood Pressure (mmHg): 136/61 Reference Range: 80 - 120 mg / dl Airway Pulse Oximetry (%): 100 Electronic Signature(s) Signed: 08/13/2020 2:07:12 PM By: [name] EMT Entered By: [name] on 08/13/2020 14:07:12

## 2020-08-15 ENCOUNTER — Telehealth: Payer: Self-pay | Admitting: Oncology

## 2020-08-15 ENCOUNTER — Encounter (HOSPITAL_BASED_OUTPATIENT_CLINIC_OR_DEPARTMENT_OTHER): Payer: Medicaid Other | Admitting: Internal Medicine

## 2020-08-15 ENCOUNTER — Other Ambulatory Visit (HOSPITAL_COMMUNITY): Payer: Self-pay

## 2020-08-15 ENCOUNTER — Other Ambulatory Visit: Payer: Self-pay | Admitting: Oncology

## 2020-08-15 NOTE — Progress Notes (Signed)
ERLENE, KASZYNSKI ([id number]) Visit Report for 08/13/2020 SuperBill Details Patient Name: Date of Service: [name] 08/13/2020 Medical Record Number: [id number] Patient Account Number: [id number] Date of Birth/Sex: Treating RN: Jun 19, 1970 (50 y.o. 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 Facility Procedures CPT4 Code Description Modifier Quantity [id number]-(Facility Use Only) HBOT full body chamber, 56mn , 4 ICD-10 Diagnosis Description T81.31XD Disruption of external operation (surgical) wound, not elsewhere classified, subsequent encounter 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 Physician Procedures Quantity CPT4 Code Description Modifier [id number] - WC PHYS HYPERBARIC OXYGEN THERAPY 1 ICD-10 Diagnosis Description T81.31XD Disruption of external operation (surgical) wound, not elsewhere classified, subsequent encounter 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

## 2020-08-15 NOTE — Telephone Encounter (Signed)
Scheduled appts per 8/9 los. Pt aware.

## 2020-08-15 NOTE — Progress Notes (Signed)
[name], [name] ([id number]) Visit Report for 08/14/2020 SuperBill Details Patient Name: Date of Service: [name] 08/14/2020 Medical Record Number: [id number] Patient Account Number: [id number] Date of Birth/Sex: Treating RN: 1970-08-08 (50 y.o. 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 Facility Procedures CPT4 Code Description Modifier Quantity [id number] G0277-(Facility Use Only) HBOT full body chamber, 78mn , 4 ICD-10 Diagnosis Description T81.31XD Disruption of external operation (surgical) wound, not elsewhere classified, subsequent encounter 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 Physician Procedures Quantity CPT4 Code Description Modifier [id number] - WC PHYS HYPERBARIC OXYGEN THERAPY 1 ICD-10 Diagnosis Description T81.31XD Disruption of external operation (surgical) wound, not elsewhere classified, subsequent encounter 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

## 2020-08-15 NOTE — Progress Notes (Signed)
[name] ([id number]) Visit Report for 08/14/2020 HBO Details Patient Name: Date of Service: [name] [name] [name] 08/14/2020 8:00 Sarah M Medical Record Number: [id number] Patient Account Number: [id number] Date of Birth/Sex: Treating RN: 01/27/1970 (50 y.o. 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 HBO Treatment Details Treatment Number: 14 Patient Type: Outpatient Chamber Type: Monoplace Chamber Serial #: [id number] Treatment Protocol: 2.5 ATA with 90 minutes oxygen, with two 5 minute air breaks Treatment Details Compression Rate Down: 2.0 psi / minute De-Compression Rate Up: 2.5 psi / minute Sarah breaks and breathing ir Compress Tx Pressure periods Decompress Decompress Begins Reached (leave unused spaces Begins Ends blank) Chamber Pressure (ATA 1 2.5 2.5 2.5 2.5 2.5 - - 2.5 1 ) Clock Time (24 hr) 08:31 08:44 09:14 09:19 09:49 09:54 - - 10:24 10:33 Treatment Length: 122 (minutes) Treatment Segments: 4 Vital Signs Capillary Blood Glucose Reference Range: 80 - 120 mg / dl HBO Diabetic Blood Glucose Intervention Range: <131 mg/dl or >249 mg/dl Time Vitals Blood Respiratory Capillary Blood Glucose Pulse Action Type: Pulse: Temperature: Taken: Pressure: Rate: Glucose (mg/dl): Meter #: Oximetry (%) Taken: Pre 08:28 122/66 93 18 98.2 100 Post 10:38 118/75 76 18 97.9 Treatment Response Treatment Toleration: Well Treatment Completion Status: Treatment Completed without Adverse Event [name] Notes Patient tolerated treatment well today. Electronic Signature(s) Signed: 08/14/2020 2:45:04 PM By: [name] EMT Signed: 08/15/2020 5:47:57 PM By: Worthy Keeler PA-C Entered By: [name] on 08/14/2020 14:45:03 -------------------------------------------------------------------------------- HBO Safety Checklist Details Patient Name: Date of Service: [name] [name] 08/14/2020 8:00 Sarah M Medical Record Number: [id number] Patient Account Number: [id number] Date of Birth/Sex: Treating RN: 07-04-1970 (50 y.o. [name] Primary Care Khushi Zupko: [name] Other Clinician: [name] Referring Rula Keniston: Treating Maritsa Hunsucker/Extender: Randon Goldsmith in Treatment: 18 HBO Safety Checklist Items Safety Checklist Consent Form Signed Patient voided / foley secured and emptied When did you last eato snack this AM Last dose of injectable or oral agent n/Sarah Ostomy pouch emptied and vented if applicable NA All implantable devices assessed, documented and approved NA Intravenous access site secured and place NA Valuables secured Linens and cotton and cotton/polyester blend (less than 51% polyester) Personal oil-based products / skin lotions / body lotions removed Wigs or hairpieces removed NA Smoking or tobacco materials removed NA Books / newspapers / magazines / loose paper removed Cologne, aftershave, perfume and deodorant removed Jewelry removed (may wrap wedding band) Make-up removed Hair care products removed Battery operated devices (external) removed Heating patches and chemical warmers removed Titanium eyewear removed NA Nail polish cured greater than 10 hours Casting material cured greater than 10 hours NA Hearing aids removed NA Loose dentures or partials removed Prosthetics have been removed NA Patient demonstrates correct use of air break device (if applicable) Patient concerns have been addressed Patient grounding bracelet on and cord attached to chamber Specifics for Inpatients (complete in addition to  above) Medication sheet sent with patient NA Intravenous medications needed or due during therapy sent with patient NA Drainage tubes (e.g. nasogastric tube or chest tube secured and vented) NA Endotracheal or Tracheotomy tube secured NA Cuff deflated of air and inflated  with saline NA Airway suctioned NA Electronic Signature(s) Signed: 08/14/2020 2:56:38 PM By: [name] EMT Entered By: [name] on 08/14/2020 10:22:46

## 2020-08-16 ENCOUNTER — Encounter (HOSPITAL_BASED_OUTPATIENT_CLINIC_OR_DEPARTMENT_OTHER): Payer: Medicaid Other | Admitting: Internal Medicine

## 2020-08-19 ENCOUNTER — Other Ambulatory Visit: Payer: Self-pay

## 2020-08-19 ENCOUNTER — Other Ambulatory Visit (HOSPITAL_COMMUNITY): Payer: Self-pay

## 2020-08-19 ENCOUNTER — Encounter (HOSPITAL_BASED_OUTPATIENT_CLINIC_OR_DEPARTMENT_OTHER): Payer: Medicaid Other | Admitting: Internal Medicine

## 2020-08-19 DIAGNOSIS — C50412 Malignant neoplasm of upper-outer quadrant of left female breast: Secondary | ICD-10-CM | POA: Diagnosis not present

## 2020-08-19 DIAGNOSIS — L59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: Secondary | ICD-10-CM | POA: Diagnosis not present

## 2020-08-19 NOTE — Progress Notes (Signed)
[name], [name] ([id number]) Visit Report for 08/19/2020 Arrival Information Details Patient Name: Date of Service: [name] 08/19/2020 10:00 A M Medical Record Number: [id number] Patient Account Number: [id number] Date of Birth/Sex: Treating RN: 1970-07-31 (50 y.o. [name] Primary Care Patrisia Faeth: [name] Other Clinician: [name] Referring Nicle Connole: Treating Kathleena Freeman/Extender: [name] in Treatment: 18 Visit Information History Since Last Visit All ordered tests and consults were completed: Yes Patient Arrived: Ambulatory Added or deleted any medications: Yes Arrival Time: 09:35 Any new allergies or adverse reactions: No Accompanied By: self Had a fall or experienced change in No Transfer Assistance: None activities of daily living that may affect Patient Identification Verified: Yes risk of falls: Secondary Verification Process Completed: Yes Signs or symptoms of abuse/neglect since last visito No Patient Requires Transmission-Based Precautions: No Hospitalized since last visit: No Patient Has Alerts: No Implantable device outside of the clinic excluding No cellular tissue based products placed in the center since last visit: Pain Present Now: No Electronic Signature(s) Signed: 08/19/2020 12:38:41 PM By: [name] EMT Entered By: [name] on 08/19/2020 12:38:41 -------------------------------------------------------------------------------- Encounter Discharge Information Details Patient Name: Date of Service: Sarah Crochet V [name] 08/19/2020 10:00 A M Medical Record Number: [id number] Patient Account Number: [id number] Date of Birth/Sex: Treating RN: Apr 24, 1970 (50 y.o. [name] Primary Care Jayvian Escoe: [name] Other Clinician: [name] Referring Zohal Reny: Treating Avi Archuleta/Extender: [name] in Treatment: 18 Encounter  Discharge Information Items Discharge Condition: Stable Ambulatory Status: Ambulatory Discharge Destination: Home Transportation: Private Auto Accompanied By: self Schedule Follow-up Appointment: No Clinical Summary of Care: Electronic Signature(s) Signed: 08/19/2020 1:49:00 PM By: [name] EMT Entered By: [name] on 08/19/2020 13:49:00 -------------------------------------------------------------------------------- Vitals Details Patient Name: Date of Service: Sarah Crochet V [name] 08/19/2020 10:00 A M Medical Record Number: [id number] Patient Account Number: [id number] Date of Birth/Sex: Treating RN: [birth year] (50 y.o. [name] Primary Care Tadan Shill: [name] Other Clinician: [name] Referring Andris Brothers: Treating Emony Dormer/Extender: [name] in Treatment: 18 Vital Signs Time Taken: 09:45 Temperature (F): 97.7 Height (in): 67 Pulse (bpm): 87 Weight (lbs): 257 Respiratory Rate (breaths/min): 20 Body Mass Index (BMI): 40.2 Blood Pressure (mmHg): 130/76 Reference Range: 80 - 120 mg / dl Airway Pulse Oximetry (%): 100 Electronic Signature(s) Signed: 08/19/2020 1:50:01 PM By: [name] EMT Entered By: [name] on 08/19/2020 11:25:30

## 2020-08-20 ENCOUNTER — Encounter (HOSPITAL_BASED_OUTPATIENT_CLINIC_OR_DEPARTMENT_OTHER): Payer: Medicaid Other | Admitting: Internal Medicine

## 2020-08-20 DIAGNOSIS — C50412 Malignant neoplasm of upper-outer quadrant of left female breast: Secondary | ICD-10-CM | POA: Diagnosis not present

## 2020-08-20 NOTE — Progress Notes (Signed)
[name], [name] ([id number]) Visit Report for 08/19/2020 SuperBill Details Patient Name: Date of Service: [name]. 08/19/2020 Medical Record Number: [id number] Patient Account Number: [id number] Date of Birth/Sex: Treating RN: 24-Nov-1970 (50 y.o. 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 Facility Procedures CPT4 Code Description Modifier Quantity [id number]-(Facility Use Only) HBOT full body chamber, 11mn , 4 ICD-10 Diagnosis Description T81.31XD Disruption of external operation (surgical) wound, not elsewhere classified, subsequent encounter 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 Physician Procedures Quantity CPT4 Code Description Modifier [id number] PHYS HYPERBARIC OXYGEN THERAPY 1 ICD-10 Diagnosis Description T81.31XD Disruption of external operation (surgical) wound, not elsewhere classified, subsequent encounter 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

## 2020-08-20 NOTE — Progress Notes (Signed)
[name], [name] ([id number]) Visit Report for 08/19/2020 HBO Details Patient Name: Date of Service: [name] [name] 08/19/2020 10:00 A M Medical Record Number: [id number] Patient Account Number: [id number] Date of Birth/Sex: Treating RN: [birth year] (50 y.o. 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 HBO Treatment Details Treatment Number: 15 Patient Type: Outpatient Chamber Type: Monoplace Chamber Serial #: [id number] Treatment Protocol: 2.5 ATA with 90 minutes oxygen, with two 5 minute air breaks Treatment Details Compression Rate Down: 2.0 psi / minute De-Compression Rate Up: 2.5 psi / minute A breaks and breathing ir Compress Tx Pressure periods Decompress Decompress Begins Reached (leave unused spaces Begins Ends blank) Chamber Pressure (ATA 1 2.5 2.5 2.5 2.5 2.5 - - 2.5 1 ) Clock Time (24 hr) 09:48 10:00 10:31 10:36 11:06 11:11 - - 11:41 11:50 Treatment Length: 122 (minutes) Treatment Segments: 4 Vital Signs Capillary Blood Glucose Reference Range: 80 - 120 mg / dl HBO Diabetic Blood Glucose Intervention Range: <131 mg/dl or >249 mg/dl Time Vitals Blood Respiratory Capillary Blood Glucose Pulse Action Type: Pulse: Temperature: Taken: Pressure: Rate: Glucose (mg/dl): Meter #: Oximetry (%) Taken: Pre 09:45 130/76 87 20 97.7 100 Post 11:51 120/81 79 18 97.7 100 Treatment Response Treatment Toleration: Well Treatment Completion Status: Treatment Completed without Adverse Event [name] Notes Patient tolerated treatment well today without issues or concerns. Patient stated she started  chemotherapy over the weekend. Physician HBO Attestation: I certify that I supervised this HBO treatment in accordance with Medicare guidelines. A trained emergency response team is readily available per Yes hospital policies and procedures. Continue HBOT as ordered. Yes Electronic Signature(s) Signed: 08/20/2020 5:15:26 PM By: Kalman Shan DO Previous Signature: 08/19/2020 12:38:19 PM Version By: [name] EMT Entered By: Kalman Shan on 08/20/2020 16:39:27 -------------------------------------------------------------------------------- HBO Safety Checklist Details Patient Name: Date of Service: [name] V [name] 08/19/2020 10:00 A M Medical Record Number: [id number] Patient Account Number: [id number] Date of Birth/Sex: Treating RN: 03-09-1970 (50 y.o. [name] Primary Care Sheralyn Pinegar: [name] Other Clinician: Donavan Khan Referring Atlantis Delong: Treating Isabel Freese/Extender: [name] in Treatment: 18 HBO Safety Checklist Items Safety Checklist Consent Form Signed Patient voided / foley secured and emptied When did you last eato snack this AM 0915 Last dose of injectable or oral agent n/a Ostomy pouch emptied and vented if applicable NA All implantable devices assessed, documented and approved NA Intravenous access site secured and place NA Valuables secured Linens and cotton and cotton/polyester blend (less than 51% polyester) Personal oil-based products / skin lotions / body lotions removed Wigs or hairpieces removed NA Smoking or tobacco materials removed NA Books / newspapers / magazines / loose paper removed Cologne, aftershave, perfume and deodorant removed Jewelry removed (may wrap wedding band) Make-up removed Hair care products removed Battery operated devices (external) removed Heating patches and chemical warmers removed Titanium eyewear removed NA Nail polish cured greater than 10  hours Casting material cured greater than 10 hours NA Hearing aids removed NA Loose dentures or partials removed Prosthetics have been removed NA Patient demonstrates correct use of air break device (if applicable) Patient concerns have been addressed Patient grounding bracelet on and cord attached to chamber Specifics  for Inpatients (complete in addition to above) Medication sheet sent with patient NA Intravenous medications needed or due during therapy sent with patient NA Drainage tubes (e.g. nasogastric tube or chest tube secured and vented) NA Endotracheal or Tracheotomy tube secured NA Cuff deflated of air and inflated with saline NA Airway suctioned NA Electronic Signature(s) Signed: 08/19/2020 1:50:01 PM By: [name] EMT Entered By: [name] on 08/19/2020 11:28:05

## 2020-08-20 NOTE — Progress Notes (Signed)
[name], [name] ([id number]) Visit Report for 08/13/2020 Arrival Information Details Patient Name: Date of Service: [name] [name] 08/13/2020 9:00 Sarah M Medical Record Number: [id number] Patient Account Number: [id number] Date of Birth/Sex: Treating RN: 04-15-1970 (50 y.o. [name], [name] Primary Care [name]: [name] Other Clinician: Referring [name]: Treating [name]/Extender: [name] in Treatment: 18 Visit Information History Since Last Visit Added or deleted any medications: No Patient Arrived: Ambulatory Any new allergies or adverse reactions: No Arrival Time: 09:09 Had Sarah fall or experienced change in No Accompanied By: self activities of daily living that may affect Transfer Assistance: None risk of falls: Patient Identification Verified: Yes Signs or symptoms of abuse/neglect since last visito No Secondary Verification Process Completed: Yes Hospitalized since last visit: No Patient Requires Transmission-Based Precautions: No Implantable device outside of the clinic excluding No Patient Has Alerts: No cellular tissue based products placed in the center since last visit: Has Dressing in Place as Prescribed: Yes Pain Present Now: No Electronic Signature(s) Signed: 08/20/2020 11:33:27 AM By: Sandre Kitty Entered By: Sandre Kitty on 08/13/2020 09:10:00 -------------------------------------------------------------------------------- Encounter Discharge Information Details Patient Name: Date of Service: Sarah Crochet V [name] 08/13/2020 9:00 Sarah M Medical Record Number: [id number] Patient Account Number: [id number] Date of Birth/Sex: Treating RN: 10-02-1970 (50 y.o. [name] Primary Care Subrena Devereux: [name] Other Clinician: Referring [name]: Treating [name]/Extender: [name] in Treatment: 18 Encounter Discharge Information Items Discharge Condition:  Stable Ambulatory Status: Ambulatory Discharge Destination: Home Transportation: Private Auto Schedule Follow-up Appointment: Yes Clinical Summary of Care: Provided on 08/13/2020 Form Type Recipient Paper Patient Patient Electronic Signature(s) Signed: 08/13/2020 10:09:55 AM By: Lorrin Jackson Entered By: Lorrin Jackson on 08/13/2020 10:09:55 -------------------------------------------------------------------------------- Lower Extremity Assessment Details Patient Name: Date of Service: Sarah Crochet V [name] 08/13/2020 9:00 Sarah M Medical Record Number: [id number] Patient Account Number: [id number] Date of Birth/Sex: Treating RN: 06/15/1970 (50 y.o. [name] Primary Care Philopater Mucha: [name] Other Clinician: Referring Candie Gintz: Treating Xeng Kucher/Extender: [name] in Treatment: 18 Electronic Signature(s) Signed: 08/13/2020 5:39:13 PM By: Fara Chute By: Lorrin Jackson on 08/13/2020 09:23:22 -------------------------------------------------------------------------------- Multi-Disciplinary Care Plan Details Patient Name: Date of Service: Sarah Crochet V [name] 08/13/2020 9:00 Sarah M Medical Record Number: [id number] Patient Account Number: [id number] Date of Birth/Sex: Treating RN: Oct 15, 1970 (50 y.o. [name], [name] Primary Care Clarence Cogswell: [name] Other Clinician: Referring Dax Murguia: Treating Avereigh Spainhower/Extender: [name] in Treatment: 18 Active Inactive Wound/Skin Impairment Nursing Diagnoses: Impaired tissue integrity Knowledge deficit related to smoking impact on wound healing Knowledge deficit related to ulceration/compromised skin integrity Goals: Patient will demonstrate Sarah reduced rate of smoking or cessation of smoking Date Initiated: 04/09/2020 Date Inactivated: 06/18/2020 Target Resolution Date: 06/28/2020 Goal Status: Met Patient will have Sarah decrease in wound volume by X% from  date: (specify in notes) Date Initiated: 04/09/2020 Date Inactivated: 07/03/2020 Target Resolution Date: 07/05/2020 Goal Status: Met Patient/caregiver will verbalize understanding of skin care regimen Date Initiated: 04/09/2020 Target Resolution Date: 08/30/2020 Goal Status: Active Ulcer/skin breakdown will have Sarah volume reduction of 30% by week 4 Date Initiated: 04/09/2020 Date Inactivated: 05/14/2020 Target Resolution Date: 05/11/2020 Unmet Reason: see wound Goal Status: Unmet meaurements. Interventions: Assess patient/caregiver ability to obtain necessary supplies Assess patient/caregiver ability to perform ulcer/skin care regimen upon admission and as needed Assess ulceration(s) every visit Provide education on smoking  Notes: Electronic Signature(s) Signed: 08/13/2020 5:35:30 PM By: Rhae Hammock RN Entered By: Rhae Hammock on 08/13/2020 10:05:49 -------------------------------------------------------------------------------- Pain Assessment Details Patient Name: Date of Service: Sarah Crochet V [name] 08/13/2020 9:00 Sarah M Medical Record Number: [id number] Patient Account Number: [id number] Date of Birth/Sex: Treating RN: 1970-07-15 (50 y.o. [name], [name] Primary Care Grayling Schranz: [name] Other Clinician: Referring Lamount Bankson: Treating Manuelito Poage/Extender: [name] in Treatment: 18 Active Problems Location of Pain Severity and Description of Pain Patient Has Paino No Site Locations Pain Management and Medication Current Pain Management: Electronic Signature(s) Signed: 08/13/2020 5:35:30 PM By: Rhae Hammock RN Signed: 08/20/2020 11:33:27 AM By: Sandre Kitty Entered By: Sandre Kitty on 08/13/2020 09:15:37 -------------------------------------------------------------------------------- Patient/Caregiver Education Details Patient Name: Date of Service: [name] [name] 8/9/2022andnbsp9:00 Sarah M Medical Record Number:  [id number] Patient Account Number: [id number] Date of Birth/Gender: Treating RN: March 21, 1970 (50 y.o. [name], [name] Primary Care Physician: [name] Other Clinician: Referring Physician: Treating Physician/Extender: [name] in Treatment: 18 Education Assessment Education Provided To: Patient Education Topics Provided Smoking and Wound Healing: Methods: Explain/Verbal Responses: State content correctly Electronic Signature(s) Signed: 08/13/2020 5:35:30 PM By: Rhae Hammock RN Entered By: Rhae Hammock on 08/13/2020 09:56:44 -------------------------------------------------------------------------------- Wound Assessment Details Patient Name: Date of Service: Sarah Crochet V [name] 08/13/2020 9:00 Sarah M Medical Record Number: [id number] Patient Account Number: [id number] Date of Birth/Sex: Treating RN: 04-15-1970 (50 y.o. [name], [name] Primary Care Tiombe Tomeo: [name] Other Clinician: Referring Aubrie Lucien: Treating Chereese Cilento/Extender: [name] in Treatment: 18 Wound Status Wound Number: 1 Primary Open Surgical Wound Etiology: Wound Location: Left Chest Secondary Necrosis (Radiation) Wounding Event: Surgical Injury Etiology: Date Acquired: 01/16/2020 Wound Status: Open Weeks Of Treatment: 18 Comorbid Hypertension, Osteoarthritis, Received Chemotherapy, Clustered Wound: No History: Received Radiation Photos Wound Measurements Length: (cm) 3 Width: (cm) 3.2 Depth: (cm) 2.1 Area: (cm) 7.54 Volume: (cm) 15.834 % Reduction in Area: -18.5% % Reduction in Volume: -126.3% Epithelialization: None Tunneling: Yes Position (o'clock): 10 Maximum Distance: (cm) 0.5 Undermining: No Wound Description Classification: Full Thickness Without Exposed Support Structures Wound Margin: Distinct, outline attached Exudate Amount: Large Exudate Type: Serosanguineous Exudate Color: red,  brown Foul Odor After Cleansing: No Slough/Fibrino Yes Wound Bed Granulation Amount: Medium (34-66%) Exposed Structure Granulation Quality: Red, Pink Fascia Exposed: No Necrotic Amount: Medium (34-66%) Fat Layer (Subcutaneous Tissue) Exposed: Yes Necrotic Quality: Adherent Slough Tendon Exposed: No Muscle Exposed: No Joint Exposed: No Bone Exposed: No Treatment Notes Wound #1 (Chest) Wound Laterality: Left Cleanser Wound Cleanser Discharge Instruction: Cleanse the wound with wound cleanser prior to applying Sarah clean dressing using gauze sponges, not tissue or cotton balls. Peri-Wound Care Topical Primary Dressing Promogran Prisma Matrix, 4.34 (sq in) (silver collagen) Discharge Instruction: Moisten collagen with saline or hydrogel. Pack prisma into small area at bottom of incision line. Apply dakin's moistened gauze over prisma. Secondary Dressing Woven Gauze Sponge, Non-Sterile 4x4 in Discharge Instruction: Apply over primary dressing as directed. ABD Pad, 8x10 Discharge Instruction: Apply over primary dressing as directed. Secured With Compression Wrap Compression Stockings Environmental education officer) Signed: 08/13/2020 5:35:30 PM By: Rhae Hammock RN Signed: 08/13/2020 5:39:13 PM By: Lorrin Jackson Entered By: Lorrin Jackson on 08/13/2020 09:25:44 -------------------------------------------------------------------------------- Vitals Details Patient Name: Date of Service: Sarah Crochet V [name] 08/13/2020 9:00 Sarah M Medical Record Number: [id number] Patient Account Number: [id number] Date of Birth/Sex: Treating RN: [birth year] (  50 y.o. [name], [name] Primary Care Montey Ebel: [name] Other Clinician: Referring Sophie Quiles: Treating Usbaldo Pannone/Extender: [name] in Treatment: 18 Vital Signs Time Taken: 09:15 Temperature (F): 98.7 Height (in): 67 Pulse (bpm): 96 Weight (lbs): 257 Respiratory Rate (breaths/min): 18 Body  Mass Index (BMI): 40.2 Blood Pressure (mmHg): 163/79 Reference Range: 80 - 120 mg / dl Electronic Signature(s) Signed: 08/20/2020 11:33:27 AM By: Sandre Kitty Entered By: Sandre Kitty on 08/13/2020 09:15:34

## 2020-08-20 NOTE — Progress Notes (Signed)
MAZELLA, MUTHIG ([id number]) Visit Report for 08/20/2020 Arrival Information Details Patient Name: Date of Service: [name] [name] 08/20/2020 10:00 Sarah M Medical Record Number: [id number] Patient Account Number: [id number] Date of Birth/Sex: Treating RN: 08-28-1970 (50 y.o. [name], [name] Primary Care Cevin Rubinstein: [name] Other Clinician: [name] Referring Nekisha Mcdiarmid: Treating Mason Burleigh/Extender: [name] in Treatment: 24 Visit Information History Since Last Visit All ordered tests and consults were completed: Yes Patient Arrived: Ambulatory Added or deleted any medications: Yes Arrival Time: 09:35 Any new allergies or adverse reactions: No Accompanied By: self Had Sarah fall or experienced change in No Transfer Assistance: None activities of daily living that may affect Patient Identification Verified: Yes risk of falls: Secondary Verification Process Completed: Yes Signs or symptoms of abuse/neglect since last visito No Patient Requires Transmission-Based Precautions: No Hospitalized since last visit: No Patient Has Alerts: No Implantable device outside of the clinic excluding Yes cellular tissue based products placed in the center since last visit: Pain Present Now: No Electronic Signature(s) Signed: 08/20/2020 5:02:04 PM By: [name] EMT Entered By: [name] on 08/20/2020 11:09:34 -------------------------------------------------------------------------------- Encounter Discharge Information Details Patient Name: Date of Service: Sarah Crochet V [name] 08/20/2020 10:00 Sarah M Medical Record Number: [id number] Patient Account Number: [id number] Date of Birth/Sex: Treating RN: 1970-11-05 (50 y.o. [name] Primary Care Fontella Shan: [name] Other Clinician: [name] Referring Curren Mohrmann: Treating Crestina Strike/Extender: [name] in Treatment: 19 Encounter  Discharge Information Items Discharge Condition: Stable Ambulatory Status: Ambulatory Discharge Destination: Home Transportation: Private Auto Accompanied By: self Schedule Follow-up Appointment: No Clinical Summary of Care: Electronic Signature(s) Signed: 08/20/2020 5:02:04 PM By: [name] EMT Entered By: [name] on 08/20/2020 12:50:07 -------------------------------------------------------------------------------- Vitals Details Patient Name: Date of Service: Sarah Crochet V [name] 08/20/2020 10:00 Sarah M Medical Record Number: [id number] Patient Account Number: [id number] Date of Birth/Sex: Treating RN: November 30, 1970 (50 y.o. [name], [name] Primary Care Jaydian Santana: [name] Other Clinician: [name] Referring Paras Kreider: Treating Sabiha Sura/Extender: [name] in Treatment: 19 Vital Signs Time Taken: 09:57 Temperature (F): 97.6 Height (in): 67 Pulse (bpm): 76 Weight (lbs): 257 Respiratory Rate (breaths/min): 18 Body Mass Index (BMI): 40.2 Blood Pressure (mmHg): 135/66 Reference Range: 80 - 120 mg / dl Airway Pulse Oximetry (%): 100 Electronic Signature(s) Signed: 08/20/2020 5:02:04 PM By: [name] EMT Entered By: [name] on 08/20/2020 11:13:52

## 2020-08-20 NOTE — Progress Notes (Signed)
[name], [name] ([id number]) Visit Report for 08/20/2020 SuperBill Details Patient Name: Date of Service: [name] 08/20/2020 Medical Record Number: [id number] Patient Account Number: [id number] Date of Birth/Sex: Treating RN: 1970/06/15 (50 y.o. 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 Facility Procedures CPT4 Code Description Modifier Quantity [id number]-(Facility Use Only) HBOT full body chamber, 68mn , 4 ICD-10 Diagnosis Description T81.31XD Disruption of external operation (surgical) wound, not elsewhere classified, subsequent encounter 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 Physician Procedures Quantity CPT4 Code Description Modifier [id number] PHYS HYPERBARIC OXYGEN THERAPY 1 ICD-10 Diagnosis Description T81.31XD Disruption of external operation (surgical) wound, not elsewhere classified, subsequent encounter 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

## 2020-08-20 NOTE — Progress Notes (Signed)
[name] ([id number]) Visit Report for 08/20/2020 HBO Details Patient Name: Date of Service: [name]. 08/20/2020 10:00 A M Medical Record Number: [id number] Patient Account Number: [id number] Date of Birth/Sex: Treating RN: [birth year] (50 y.o. 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 HBO Treatment Details Treatment Number: 16 Patient Type: Outpatient Chamber Type: Monoplace Chamber Serial #: [id number] Treatment Protocol: 2.5 ATA with 90 minutes oxygen, with two 5 minute air breaks Treatment Details Compression Rate Down: 2.0 psi / minute De-Compression Rate Up: 2.5 psi / minute A breaks and breathing ir Compress Tx Pressure periods Decompress Decompress Begins Reached (leave unused spaces Begins Ends blank) Chamber Pressure (ATA 1 2.5 2.5 2.5 2.5 2.5 - - 2.5 1 ) Clock Time (24 hr) 10:00 10:12 10:42 10:47 11:17 11:22 - - 11:52 12:01 Treatment Length: 121 (minutes) Treatment Segments: 4 Vital Signs Capillary Blood Glucose Reference Range: 80 - 120 mg / dl HBO Diabetic Blood Glucose Intervention Range: <131 mg/dl or >249 mg/dl Time Vitals Blood Respiratory Capillary Blood Glucose Pulse Action Type: Pulse: Temperature: Taken: Pressure: Rate: Glucose (mg/dl): Meter #: Oximetry (%) Taken: Pre 09:57 135/66 76 18 97.6 100 Post 12:03 120/79 78 18 97.9 100 Treatment Response Treatment Toleration: Well Treatment Completion Status: Treatment Completed without Adverse Event [name] Notes Patient tolerated treatment well. No concern with hyperbaric treatment today Physician HBO Attestation: I  certify that I supervised this HBO treatment in accordance with Medicare guidelines. A trained emergency response team is readily available per Yes hospital policies and procedures. Continue HBOT as ordered. Yes Electronic Signature(s) Signed: 08/20/2020 4:53:39 PM By: [name] MD Entered By: [name] on 08/20/2020 16:49:42 -------------------------------------------------------------------------------- HBO Safety Checklist Details Patient Name: Date of Service: [name]. 08/20/2020 10:00 A M Medical Record Number: [id number] Patient Account Number: [id number] Date of Birth/Sex: Treating RN: 1970-12-19 (50 y.o. [name], [name] Primary Care [name]: [name] Other Clinician: [name] Referring [name]: Treating [name]/Extender: [name] in Treatment: 19 HBO Safety Checklist Items Safety Checklist Consent Form Signed Patient voided / foley secured and emptied When did you last eato snack this AM 0915 Last dose of injectable or oral agent n/a Ostomy pouch emptied and vented if applicable NA All implantable devices assessed, documented and approved NA Intravenous access site secured and place NA Valuables secured NA Linens and cotton and cotton/polyester blend (less than 51% polyester) Personal oil-based products / skin lotions / body lotions removed Wigs or hairpieces removed NA Smoking or tobacco materials removed NA Books / newspapers / magazines / loose paper removed Cologne, aftershave, perfume and deodorant removed Jewelry removed (may wrap wedding band) Make-up removed Hair care products removed Battery operated devices (external) removed Heating patches and chemical warmers removed Titanium eyewear removed NA Nail polish cured greater than 10 hours Casting material cured greater than 10 hours NA Hearing aids removed NA Loose dentures or partials removed Prosthetics have been  removed NA Patient demonstrates correct use of air break device (if applicable) Patient concerns have been addressed Patient grounding bracelet on and cord attached to chamber Specifics for Inpatients (complete in addition to above) Medication sheet sent with patient NA Intravenous medications needed  or due during therapy sent with patient NA Drainage tubes (e.g. nasogastric tube or chest tube secured and vented) NA Endotracheal or Tracheotomy tube secured NA Cuff deflated of air and inflated with saline NA Airway suctioned NA Electronic Signature(s) Signed: 08/20/2020 5:02:04 PM By: [name] EMT Entered By: [name] on 08/20/2020 11:16:04

## 2020-08-21 ENCOUNTER — Encounter: Payer: Medicaid Other | Admitting: Plastic Surgery

## 2020-08-21 ENCOUNTER — Other Ambulatory Visit: Payer: Self-pay

## 2020-08-21 ENCOUNTER — Other Ambulatory Visit (HOSPITAL_COMMUNITY): Payer: Self-pay

## 2020-08-21 ENCOUNTER — Encounter (HOSPITAL_BASED_OUTPATIENT_CLINIC_OR_DEPARTMENT_OTHER): Payer: Medicaid Other | Admitting: Internal Medicine

## 2020-08-21 ENCOUNTER — Encounter (HOSPITAL_BASED_OUTPATIENT_CLINIC_OR_DEPARTMENT_OTHER): Payer: Medicaid Other | Admitting: Physician Assistant

## 2020-08-21 DIAGNOSIS — C50412 Malignant neoplasm of upper-outer quadrant of left female breast: Secondary | ICD-10-CM | POA: Diagnosis not present

## 2020-08-21 NOTE — Progress Notes (Signed)
ODETTA, FORNESS ([id number]) Visit Report for 08/21/2020 Arrival Information Details Patient Name: Date of Service: [name] 08/21/2020 7:30 A M Medical Record Number: [id number] Patient Account Number: [id number] Date of Birth/Sex: Treating RN: 08-16-1970 (50 y.o. [name] Primary Care Nevaya Nagele: [name] Other Clinician: Referring Messiah Ahr: Treating Marlo Goodrich/Extender: [name] in Treatment: 52 Visit Information History Since Last Visit Added or deleted any medications: No Patient Arrived: Ambulatory Any new allergies or adverse reactions: No Arrival Time: 07:56 Had a fall or experienced change in No Accompanied By: self activities of daily living that may affect Transfer Assistance: None risk of falls: Patient Identification Verified: Yes Signs or symptoms of abuse/neglect since last visito No Secondary Verification Process Completed: Yes Hospitalized since last visit: No Patient Requires Transmission-Based Precautions: No Implantable device outside of the clinic excluding No Patient Has Alerts: No cellular tissue based products placed in the center since last visit: Has Dressing in Place as Prescribed: Yes Pain Present Now: Yes Electronic Signature(s) Signed: 08/21/2020 2:50:45 PM By: Sandre Kitty Entered By: Sandre Kitty on 08/21/2020 07:56:27 -------------------------------------------------------------------------------- Encounter Discharge Information Details Patient Name: Date of Service: [name] 08/21/2020 7:30 A M Medical Record Number: [id number] Patient Account Number: [id number] Date of Birth/Sex: Treating RN: 1970-02-23 (50 y.o. [name] Primary Care Olia Hinderliter: [name] Other Clinician: Referring Kimber Fritts: Treating Lexany Belknap/Extender: [name] in Treatment: 19 Encounter Discharge Information Items Discharge Condition:  Stable Ambulatory Status: Ambulatory Discharge Destination: Home Transportation: Private Auto Schedule Follow-up Appointment: Yes Clinical Summary of Care: Provided on 08/21/2020 Form Type Recipient Paper Patient Patient Electronic Signature(s) Signed: 08/21/2020 6:24:39 PM By: Lorrin Jackson Entered By: Lorrin Jackson on 08/21/2020 08:22:36 -------------------------------------------------------------------------------- Lower Extremity Assessment Details Patient Name: Date of Service: [name] 08/21/2020 7:30 A M Medical Record Number: [id number] Patient Account Number: [id number] Date of Birth/Sex: Treating RN: 1970-10-31 (50 y.o. [name] Primary Care Kimberlyn Quiocho: [name] Other Clinician: Referring Reeva Davern: Treating Emaline Karnes/Extender: [name] in Treatment: 19 Electronic Signature(s) Signed: 08/21/2020 6:24:39 PM By: Lorrin Jackson Entered By: Lorrin Jackson on 08/21/2020 07:58:02 -------------------------------------------------------------------------------- Multi Wound Chart Details Patient Name: Date of Service: [name] 08/21/2020 7:30 A M Medical Record Number: [id number] Patient Account Number: [id number] Date of Birth/Sex: Treating RN: [birth year] (51 y.o. [name] Primary Care Dailan Pfalzgraf: [name] Other Clinician: Referring Geri Hepler: Treating Kip Cropp/Extender: [name] in Treatment: 19 Vital Signs Height(in): 34 Pulse(bpm): 45 Weight(lbs): 67 Blood Pressure(mmHg): 113/79 Body Mass Index(BMI): 40 Temperature(F): 98.0 Respiratory Rate(breaths/min): 18 Photos: [N/A:N/A] Left Chest N/A N/A Wound Location: Surgical Injury N/A N/A Wounding Event: Open Surgical Wound N/A N/A Primary Etiology: Necrosis (Radiation) N/A N/A Secondary Etiology: Hypertension, Osteoarthritis, ReceivedN/A N/A Comorbid History: Chemotherapy, Received  Radiation 01/16/2020 N/A N/A Date Acquired: 101 N/A N/A Weeks of Treatment: Open N/A N/A Wound Status: Full Thickness Without Exposed N/A N/A Classification: Support Structures Large N/A N/A Exudate Amount: Serosanguineous N/A N/A Exudate Type: red, brown N/A N/A Exudate Color: Distinct, outline attached N/A N/A Wound Margin: Medium (34-66%) N/A N/A Granulation Amount: Red, Pink N/A N/A Granulation Quality: Medium (34-66%) N/A N/A Necrotic Amount: Fat Layer (Subcutaneous Tissue): Yes N/A N/A Exposed Structures: Fascia: No Tendon: No Muscle: No Joint: No Bone: No None N/A N/A Epithelialization: Negative Pressure Wound Therapy N/A N/A Procedures Performed: Application (NPWT) Treatment Notes Wound #1 (Chest)  Wound Laterality: Left Cleanser Wound Cleanser Discharge Instruction: Cleanse the wound with wound cleanser prior to applying a clean dressing using gauze sponges, not tissue or cotton balls. Peri-Wound Care Topical Primary Dressing Santyl Ointment Discharge Instruction: Apply to base under vac sponge Secondary Dressing Secured With Compression Wrap Compression Stockings Add-Ons Electronic Signature(s) Signed: 08/21/2020 5:36:21 PM By: [name] MD Signed: 08/21/2020 6:11:36 PM By: Levan Hurst RN, BSN Entered By: [name] on 08/21/2020 08:32:17 -------------------------------------------------------------------------------- Multi-Disciplinary Care Plan Details Patient Name: Date of Service: [name] 08/21/2020 7:30 A M Medical Record Number: [id number] Patient Account Number: [id number] Date of Birth/Sex: Treating RN: 07-01-1970 (50 y.o. [name] Primary Care Gaberial Cada: [name] Other Clinician: Referring Ximenna Fonseca: Treating Daxon Kyne/Extender: [name] in Treatment: 19 Active Inactive Wound/Skin Impairment Nursing Diagnoses: Impaired tissue integrity Knowledge deficit  related to smoking impact on wound healing Knowledge deficit related to ulceration/compromised skin integrity Goals: Patient will demonstrate a reduced rate of smoking or cessation of smoking Date Initiated: 04/09/2020 Date Inactivated: 06/18/2020 Target Resolution Date: 06/28/2020 Goal Status: Met Patient will have a decrease in wound volume by X% from date: (specify in notes) Date Initiated: 04/09/2020 Date Inactivated: 07/03/2020 Target Resolution Date: 07/05/2020 Goal Status: Met Patient/caregiver will verbalize understanding of skin care regimen Date Initiated: 04/09/2020 Target Resolution Date: 08/30/2020 Goal Status: Active Ulcer/skin breakdown will have a volume reduction of 30% by week 4 Date Initiated: 04/09/2020 Date Inactivated: 05/14/2020 Target Resolution Date: 05/11/2020 Unmet Reason: see wound Goal Status: Unmet meaurements. Interventions: Assess patient/caregiver ability to obtain necessary supplies Assess patient/caregiver ability to perform ulcer/skin care regimen upon admission and as needed Assess ulceration(s) every visit Provide education on smoking Notes: Electronic Signature(s) Signed: 08/21/2020 6:24:39 PM By: Lorrin Jackson Entered By: Lorrin Jackson on 08/21/2020 08:02:16 -------------------------------------------------------------------------------- Negative Pressure Wound Therapy Application (NPWT) Details Patient Name: Date of Service: [name] V [name] 08/21/2020 7:30 A M Medical Record Number: [id number] Patient Account Number: [id number] Date of Birth/Sex: Treating RN: 20-Apr-1970 (50 y.o. [name] Primary Care Calvert Charland: [name] Other Clinician: Referring Emilya Justen: Treating Valdemar Mcclenahan/Extender: [name] in Treatment: 19 NPWT Application Performed for: Wound #1 Left Chest Performed By: Lorrin Jackson, RN Type: VAC System Pressure Type: Constant Pressure Setting: 125 mmHG Drain Type: None Primary  Contact: Other Other: Santyl Quantity of Sponges/Gauze Inserted: 1 Sponge/Dressing Type: Foam, Black Date Initiated: 08/21/2020 Post Procedure Diagnosis Same as Pre-procedure Electronic Signature(s) Signed: 08/21/2020 6:24:39 PM By: Lorrin Jackson Entered By: Lorrin Jackson on 08/21/2020 08:16:36 -------------------------------------------------------------------------------- Pain Assessment Details Patient Name: Date of Service: [name] 08/21/2020 7:30 A M Medical Record Number: [id number] Patient Account Number: [id number] Date of Birth/Sex: Treating RN: July 18, 1970 (50 y.o. [name] Primary Care Leaha Cuervo: [name] Other Clinician: Referring Diamone Whistler: Treating Tzirel Leonor/Extender: [name] in Treatment: 19 Active Problems Location of Pain Severity and Description of Pain Patient Has Paino No Site Locations Pain Management and Medication Current Pain Management: Electronic Signature(s) Signed: 08/21/2020 2:50:45 PM By: Sandre Kitty Signed: 08/21/2020 6:11:36 PM By: Levan Hurst RN, BSN Entered By: Sandre Kitty on 08/21/2020 07:56:51 -------------------------------------------------------------------------------- Patient/Caregiver Education Details Patient Name: Date of Service: [name] 8/17/2022andnbsp7:30 A M Medical Record Number: [id number] Patient Account Number: [id number] Date of Birth/Gender: Treating RN: Feb 04, 1970 (50 y.o. [name] Primary Care Physician: [name] Other Clinician: Referring Physician: Treating Physician/Extender: [name]  Alverda Skeans in Treatment: 19 Education Assessment Education Provided To: Patient Education Topics Provided Wound/Skin Impairment: Methods: Demonstration, Explain/Verbal, Printed Responses: State content correctly [employer]) Signed: 08/21/2020 6:24:39 PM By: Lorrin Jackson Entered By:  Lorrin Jackson on 08/21/2020 08:02:46 -------------------------------------------------------------------------------- Wound Assessment Details Patient Name: Date of Service: [name] 08/21/2020 7:30 A M Medical Record Number: [id number] Patient Account Number: [id number] Date of Birth/Sex: Treating RN: [birth year] (50 y.o. [name] Primary Care Arilla Hice: [name] Other Clinician: Referring Karlee Staff: Treating Sanay Belmar/Extender: [name] in Treatment: 19 Wound Status Wound Number: 1 Primary Open Surgical Wound Etiology: Wound Location: Left Chest Secondary Necrosis (Radiation) Wounding Event: Surgical Injury Etiology: Date Acquired: 01/16/2020 Wound Status: Open Weeks Of Treatment: 19 Comorbid Hypertension, Osteoarthritis, Received Chemotherapy, Clustered Wound: No History: Received Radiation Photos Wound Measurements % Reduction in Area: % Reduction in Volume: Epithelialization: None Wound Description Classification: Full Thickness Without Exposed Support Structures Wound Margin: Distinct, outline attached Exudate Amount: Large Exudate Type: Serosanguineous Exudate Color: red, brown Foul Odor After Cleansing: No Slough/Fibrino Yes Wound Bed Granulation Amount: Medium (34-66%) Exposed Structure Granulation Quality: Red, Pink Fascia Exposed: No Necrotic Amount: Medium (34-66%) Fat Layer (Subcutaneous Tissue) Exposed: Yes Necrotic Quality: Adherent Slough Tendon Exposed: No Muscle Exposed: No Joint Exposed: No Bone Exposed: No Treatment Notes Wound #1 (Chest) Wound Laterality: Left Cleanser Wound Cleanser Discharge Instruction: Cleanse the wound with wound cleanser prior to applying a clean dressing using gauze sponges, not tissue or cotton balls. Peri-Wound Care Topical Primary Dressing Santyl Ointment Discharge Instruction: Apply to base under vac sponge Secondary Dressing Secured With Compression  Wrap Compression Stockings Add-Ons Electronic Signature(s) Signed: 08/21/2020 2:50:45 PM By: Sandre Kitty Signed: 08/21/2020 6:11:36 PM By: Levan Hurst RN, BSN Signed: 08/21/2020 6:11:36 PM By: Levan Hurst RN, BSN Entered By: Sandre Kitty on 08/21/2020 08:02:11 -------------------------------------------------------------------------------- Vitals Details Patient Name: Date of Service: [name] 08/21/2020 7:30 A M Medical Record Number: [id number] Patient Account Number: [id number] Date of Birth/Sex: Treating RN: 03/18/1970 (50 y.o. [name] Primary Care Chibuike Fleek: [name] Other Clinician: Referring Brienne Liguori: Treating Ruford Dudzinski/Extender: [name] in Treatment: 19 Vital Signs Time Taken: 07:56 Temperature (F): 98.0 Height (in): 67 Pulse (bpm): 92 Weight (lbs): 257 Respiratory Rate (breaths/min): 18 Body Mass Index (BMI): 40.2 Blood Pressure (mmHg): 113/79 Reference Range: 80 - 120 mg / dl Electronic Signature(s) Signed: 08/21/2020 2:50:45 PM By: Sandre Kitty Entered By: Sandre Kitty on 08/21/2020 07:56:46

## 2020-08-21 NOTE — Progress Notes (Signed)
[name], [name] ([id number]) Visit Report for 08/21/2020 HPI Details Patient Name: Date of Service: [name] 08/21/2020 7:30 A M Medical Record Number: [id number] Patient Account Number: [id number] Date of Birth/Sex: Treating RN: 09/13/1970 (50 y.o. [name] Primary Care Provider: [name] Other Clinician: Referring Provider: Treating Provider/Extender: [name] in Treatment: 19 History of Present Illness HPI Description: ADMISSION 04/09/2020 This is a 50 year old woman who is history of breast cancer in the left breast goes back to 2018. He was found to have a mass in her left breast. She underwent a left back lumpectomy and then subsequent chemo that she did not tolerate very well. She had seed implants placed but then underwent external beam a radiation in the spring 2019. 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 Her adjunctive radiation was from 03/11/2017 through 04/21/2017. Unfortunately she had a left breast biopsy on 11/24/2019 and it was again positive for an invasive ductal carcinoma grade 3 he is to resume at receptor 80% positive. HER-2 negative progesterone negative. She opted for bilateral mastectomies which were done on 01/09/2020 on the right with no evidence of malignancy on the left stage IIb invasive ductal carcinoma. She again received chemotherapy but she did not tolerate it because of wound dehiscence and low platelet counts according to the patient. Unfortunately her mastectomy wound dehisced on the left breast. She was taken back to the OR by [name] of general surgery on 33/24/22 and she underwent a debridement and subsequent attempt at closure. She had been previously treated with Augmentin for suspected underlying infection. Once again the wound has dehisced in the mid  aspect. It is roughly 1 cm deep there is tunnels both medially and laterally she. She still has sutures in place. Still describes constant pain drainage. She also has a drain still in place that drains about 10 cc/day. Currently she is changing ABD pad several times a day 4/12; patient went back to see [name] who remove most of the stitches and debrided her wound. She has been using silver alginate culture I did last time which was a PCR culture showed Peptostreptococcus, Enterococcus faecalis and Staphylococcus epidermidis which is of questionable significance. She has a larger wound now that the sutures have been removed. There are undermining areas superior medial superior lateral and a deep tunneling area in the inferior part of the wound. There is no obvious purulence. I started her on Augmentin on 04/12/2020 and I will likely extend this for another week AMAZINGLY; she is already been approved for HBO and will start today. [name] Echocardiogram which I reviewed from 2019 showed a normal ejection fraction.. I went over side effects of HBO with the patient and answered any of her questions. Her husband was present 5/3; its been a while since have seen this wound. She went to see [name] about 2-1/2 weeks ago according to the patient he can remove some of the surrounding debris. She will see him again later this week or early next week. She has been using silver alginate. A lot of this is problematic since she has Medicaid which traditionally does not cover wound care supplies although it traditionally does not cover HBO either Her wound actually looks quite a bit better than the last time I saw this better looking surface healthy looking  tissue. She has tunnels at 810 2 in 6 the deepest of these is at 2 at 2.2 cm there is no exposed bone. 5/10; the patient was coming in for nurse visit today but requested that I see the wound because of surface discoloration. She  states the Iodoflex makes the surface of this turned "white". She is not really complaining of undue tenderness. She is changing this every second day. The goal of this had been to get the surface of the wound to a vibrant red color and then potentially look at changing to a silver collagen-based dressing, Puraply, and then perhaps an alternative skin substituteo Derma vest. The patient tells me she has an appoint with her oncologist [name] on the 19th. She apparently think she is going to get another PET scan. Based on this I would like his comment about whether there is possibility of any malignancy in the wound itself or just underneath it. Up to now we have not really thought that was true 5/17; nice improvement in the chest wound. Less depth. We have been using Iodoflex to clean off the wound surface this seems to have done nicely. Patient went to see [name] 2 days ago who has referred her to see Dr. Claudia Desanctis. I certainly agree with the thought the surface of the wound looks a lot better now and I think consideration of plastic surgery is certainly something the patient should discuss. I have talked to the patient about this today and I certainly support a conversation with Dr. Claudia Desanctis. I also wondered about is there any residual cancer in this. [name] apparently did not seem to think so per the patient although I do not see that in the text conversations we have had. I also wonder about a skin substitute if her version of Medicare [[employer]] would cover this. [name] is apparently considering starting her on [place]. Patient is already very scared about the side effects but she will discuss this with [name] on 5/19 5/31; patient's wound about the same in terms of size she still has the tunneling area to the left. Surface of the wound looks a lot better there has been some filling in the granulation I have been included on serial text between Dr. Callie Fielding surgery]  Dr. Ruffin Frederick oncology] and Dr. Para Skeans surgery]. I think there is not a lot of thought that the patient has recurrence of her cancer her MRI was indeterminant but [name] did not feel that there was any reason to think she has a major recurrence and he was not in favor of doing a dedicated breast MRI. Dr. Claudia Desanctis wants to go ahead with a flap closure I did it. I have not reviewed his note but per the patient this is a latissimus dorsi myocutaneous flap or I think a skin flap from her abdomen. I have talked to the patient I think it is time to attempt this. If I can continue hyperbarics surrounding the flap then we will attempt to do this 6/14; patient using Prisma as the primary dressing here. She has completed hyperbarics and did very well. Her wound is responded nicely. The depth of this and the condition of the granulation tissue looks a lot better. She has undermining areas at 2 and 10:00 both of these seem to be better as well. She is scheduled for flap closure on 7/11 by Dr. Claudia Desanctis 6/29; the patient continues to use Prisma wet-to-dry. Her graft is scheduled for 7/11 by  Dr. Claudia Desanctis. We are looking at getting her additional hyperbaric treatment which I think might help with the likelihood of graft survival in this difficult irradiated area 7/21; patient returns to clinic to reinitiate HBO after her surgical procedure by Dr. Claudia Desanctis. On 07/15/2020 she had debridement of left chest wound and a rotational flap closure. She also had excision of right excess axillary tissue. Pathology did not show any malignancy and follow-up Dr. Claudia Desanctis saw the patient yesterday. He noted a 2 cm area of wound dehiscence. Recommended she return for hyperbaric oxygen and she returns today for her first treatment. Follow-up with him in 2 weeks. We were not actually going to see the patient today her appointment to be evaluated was Monday however she was concerned about a "fishy odor". She is also noted some  drainage. Not specifically dressing in the wound area but covering with Adaptic 7/25; I am seeing this wound and follow-up. So far her culture is polymicrobial not growing any specific anything specific nevertheless with the Augmentin and the silver alginate her odor is a lot better. Not as much periwound tenderness. Dimensions measuring 2.7 x 3.3 x 1.6. She has undermining from 10-1 o'clock of about 1 and half centimeters. Necrotic fat on the surface of this... 08/13/2020 upon evaluation today patient appears to be doing decently well in regard to her wound all things considered. Obviously this is much more open than what was hoped for based on the fact that she had a flap performed which was supposed to completely close this wound. Nonetheless currently she tells me that she is not really having any significant pain fortunately the drainage is improved compared to last time there is still some evidence of odor as far as the drainage is concerned but is definitely not as purulent as what was previously noted. With that being said I do believe that there are quite a few sutures which are quite embedded. Nonetheless I believe this needs to be removed. In fact if they are not I am afraid this is again actually grow into the skin and that is can to be even bigger issue. Some of them even so at this point appeared that there could be difficult to even get out. 8/17; this is the first time I am seeing this in about 3 weeks. The patient had stop by my office yesterday to discuss after completing hyperbaric therapy. Based on what she said I was expecting the worst today however apparently with dressing changes and perhaps debridement last week the surface of the wound actually looks better than what I was expecting. There is considerable depth although the last time I saw this the base of the wound was necrotic there is still some necrotic tissue on the surface of the wound bed however in general the rest the  tissue looks healthy even with some bleeding. She has been using cleaning the wound with Dakin's and then applying Prisma and then backing Dakin's wet-to-dry. This seems to work to clean the wound surface up. They also ordered a wound VAC and I discussed this with her in depth today She is not systemically unwell. She is not running a fever there is no odor. She talks about pain over the lower part of the flap to touch. This does not appear to be related to movement of her shoulder. Electronic Signature(s) Signed: 08/21/2020 5:36:21 PM By: [name] MD Entered By: [name] on 08/21/2020 08:35:05 -------------------------------------------------------------------------------- Physical Exam Details Patient Name: Date of Service: Michel Bickers  A L, [name] 08/21/2020 7:30 A M Medical Record Number: [id number] Patient Account Number: [id number] Date of Birth/Sex: Treating RN: [birth year] (50 y.o. [name] Primary Care Provider: [name] Other Clinician: Referring Provider: Treating Provider/Extender: [name] in Treatment: 19 Constitutional Sitting or standing Blood Pressure is within target range for patient.. Pulse regular and within target range for patient.Marland Kitchen Respirations regular, non-labored and within target range.. Temperature is normal and within the target range for the patient.Marland Kitchen Appears in no distress. Notes Wound exam; upper part of her surgical incision with close proximity to the anterior axillary line. The wound itself is fairly large with significant depth. However there is much better wound surface. Still some loose slough at the base of this I did not debride this. No evidence of surrounding infection. With regards to the tissue below the wound area everything here look completely normal no erythema no crepitus and although that she said there was some tenderness that did not appear to be either compromised or infected. No  mechanical debridement today. The suture line looks clean I note that our staff remove the sutures when she was last seen 8 days ago Electronic Signature(s) Signed: 08/21/2020 5:36:21 PM By: [name] MD Entered By: [name] on 08/21/2020 08:36:47 -------------------------------------------------------------------------------- Physician Orders Details Patient Name: Date of Service: [name] 08/21/2020 7:30 A M Medical Record Number: [id number] Patient Account Number: [id number] Date of Birth/Sex: Treating RN: 01/23/1970 (50 y.o. [name] Primary Care Provider: [name] Other Clinician: Referring Provider: Treating Provider/Extender: [name] in Treatment: 56 Verbal / Phone Orders: No Diagnosis Coding Follow-up Appointments ppointment in 1 week. - Tuesday with Dr. Dellia Nims Return A Nurse Visit: - Vac dressing change Friday 08/23/20 Bathing/ Shower/ Hygiene May shower with protection but do not get wound dressing(s) wet. Negative Presssure Wound Therapy Wound Vac to wound continuously at 13mm/hg pressure Black Foam - Apply Santyl to slough on base then apply black foam Wound Treatment Wound #1 - Chest Wound Laterality: Left Cleanser: Wound Cleanser 1 x Per Day/15 Days Discharge Instructions: Cleanse the wound with wound cleanser prior to applying a clean dressing using gauze sponges, not tissue or cotton balls. Prim Dressing: Santyl Ointment 1 x Per CLE/75 Days ary Discharge Instructions: Apply to base under vac sponge Electronic Signature(s) Signed: 08/21/2020 5:36:21 PM By: [name] MD Signed: 08/21/2020 6:24:39 PM By: Lorrin Jackson Entered By: Lorrin Jackson on 08/21/2020 08:21:11 -------------------------------------------------------------------------------- Problem List Details Patient Name: Date of Service: [name] 08/21/2020 7:30 A M Medical Record Number: [id number] Patient  Account Number: [id number] Date of Birth/Sex: Treating RN: [birth year] (50 y.o. 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 08/21/2020 7:30  A M Medical Record Number: [id number] Patient Account Number: [id number] Date of Birth/Sex: Treating RN: 1971/01/01 (50 y.o. [name] Primary Care Provider: [name] Other Clinician: Referring Provider: Treating Provider/Extender: [name] in Treatment: 19 Subjective History of Present Illness (HPI) ADMISSION 04/09/2020 This is a 50 year old woman who is history of breast cancer in the left breast goes back to 2018. He was found to have a mass in her left breast. She underwent a left back lumpectomy and then subsequent chemo that she did not tolerate very well. She had seed implants placed but then underwent external beam a radiation in the spring 2019. 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 Her adjunctive radiation was from 03/11/2017 through 04/21/2017. Unfortunately she had a left breast biopsy on 11/24/2019 and it was again positive for an invasive ductal carcinoma grade 3 he is to resume at receptor 80% positive. HER-2 negative progesterone negative. She opted for bilateral mastectomies which were done on 01/09/2020 on the right with no evidence of malignancy on the left stage IIb invasive ductal carcinoma. She again received chemotherapy but she did not tolerate it because of wound dehiscence and low platelet counts according to the patient. Unfortunately her mastectomy wound dehisced on the left breast. She was taken back to the OR by [name] of general surgery on 33/24/22 and she underwent a debridement and subsequent attempt at closure. She had been previously treated with Augmentin for suspected underlying infection. Once again the wound has dehisced in the mid aspect. It is roughly 1 cm deep there is tunnels both medially and laterally she. She still has sutures in place. Still describes constant pain drainage. She also has a drain still in place that drains about 10 cc/day. Currently she is changing ABD pad several times a day 4/12; patient went back to see [name] who remove most of the stitches and debrided her wound. She has been using silver alginate culture I did last time which was a PCR culture showed Peptostreptococcus, Enterococcus faecalis and Staphylococcus epidermidis which is of questionable significance. She has a larger wound now that the sutures have been removed. There are undermining areas superior medial superior lateral and a deep tunneling area in the inferior part of the wound. There is no obvious purulence. I started her on Augmentin on 04/12/2020 and I will likely extend this for another week AMAZINGLY; she is already been approved for HBO and will start today. Her chest x-ray  was negative. Echocardiogram which I reviewed from 2019 showed a normal ejection fraction.. I went over side effects of HBO with the patient and answered any of her questions. Her husband was present 5/3; its been a while since have seen this wound. She went to see [name] about 2-1/2 weeks ago according to the patient he can remove some of the surrounding debris. She will see him again later this week or early next week. She has been using silver alginate. A lot of this is problematic since she has Medicaid which traditionally does not cover wound care supplies although it traditionally does not cover HBO either Her wound actually looks quite a bit better than the last time I saw this better looking surface healthy looking tissue. She has tunnels at 810 2 in 6 the deepest of these is at 2 at 2.2 cm there is no exposed bone.  5/10; the patient was coming in for nurse visit today but requested that I see the wound because of surface discoloration. She states the Iodoflex makes the surface of this turned "white". She is not really complaining of undue tenderness. She is changing this every second day. The goal of this had been to get the surface of the wound to a vibrant red color and then potentially look at changing to a silver collagen-based dressing, Puraply, and then perhaps an alternative skin substituteo Derma vest. The patient tells me she has an appoint with her oncologist [name] on the 19th. She apparently think she is going to get another PET scan. Based on this I would like his comment about whether there is possibility of any malignancy in the wound itself or just underneath it. Up to now we have not really thought that was true 5/17; nice improvement in the chest wound. Less depth. We have been using Iodoflex to clean off the wound surface this seems to have done nicely. Patient went to see [name] 2 days ago who has referred her to see Dr. Claudia Desanctis. I certainly agree with the  thought the surface of the wound looks a lot better now and I think consideration of plastic surgery is certainly something the patient should discuss. I have talked to the patient about this today and I certainly support a conversation with Dr. Claudia Desanctis. I also wondered about is there any residual cancer in this. [name] apparently did not seem to think so per the patient although I do not see that in the text conversations we have had. I also wonder about a skin substitute if her version of Medicare [[employer]] would cover this. [name] is apparently considering starting her on [place]. Patient is already very scared about the side effects but she will discuss this with [name] on 5/19 5/31; patient's wound about the same in terms of size she still has the tunneling area to the left. Surface of the wound looks a lot better there has been some filling in the granulation I have been included on serial text between Dr. Callie Fielding surgery] Dr. Ruffin Frederick oncology] and Dr. Para Skeans surgery]. I think there is not a lot of thought that the patient has recurrence of her cancer her MRI was indeterminant but [name] did not feel that there was any reason to think she has a major recurrence and he was not in favor of doing a dedicated breast MRI. Dr. Claudia Desanctis wants to go ahead with a flap closure I did it. I have not reviewed his note but per the patient this is a latissimus dorsi myocutaneous flap or I think a skin flap from her abdomen. I have talked to the patient I think it is time to attempt this. If I can continue hyperbarics surrounding the flap then we will attempt to do this 6/14; patient using Prisma as the primary dressing here. She has completed hyperbarics and did very well. Her wound is responded nicely. The depth of this and the condition of the granulation tissue looks a lot better. She has undermining areas at 2 and 10:00 both of these seem to be better as well. She  is scheduled for flap closure on 7/11 by Dr. Claudia Desanctis 6/29; the patient continues to use Prisma wet-to-dry. Her graft is scheduled for 7/11 by Dr. Claudia Desanctis. We are looking at getting her additional hyperbaric treatment which I think might help with the likelihood of graft survival in this  difficult irradiated area 7/21; patient returns to clinic to reinitiate HBO after her surgical procedure by Dr. Claudia Desanctis. On 07/15/2020 she had debridement of left chest wound and a rotational flap closure. She also had excision of right excess axillary tissue. Pathology did not show any malignancy and follow-up Dr. Claudia Desanctis saw the patient yesterday. He noted a 2 cm area of wound dehiscence. Recommended she return for hyperbaric oxygen and she returns today for her first treatment. Follow-up with him in 2 weeks. We were not actually going to see the patient today her appointment to be evaluated was Monday however she was concerned about a "fishy odor". She is also noted some drainage. Not specifically dressing in the wound area but covering with Adaptic 7/25; I am seeing this wound and follow-up. So far her culture is polymicrobial not growing any specific anything specific nevertheless with the Augmentin and the silver alginate her odor is a lot better. Not as much periwound tenderness. Dimensions measuring 2.7 x 3.3 x 1.6. She has undermining from 10-1 o'clock of about 1 and half centimeters. Necrotic fat on the surface of this... 08/13/2020 upon evaluation today patient appears to be doing decently well in regard to her wound all things considered. Obviously this is much more open than what was hoped for based on the fact that she had a flap performed which was supposed to completely close this wound. Nonetheless currently she tells me that she is not really having any significant pain fortunately the drainage is improved compared to last time there is still some evidence of odor as far as the drainage is concerned but is  definitely not as purulent as what was previously noted. With that being said I do believe that there are quite a few sutures which are quite embedded. Nonetheless I believe this needs to be removed. In fact if they are not I am afraid this is again actually grow into the skin and that is can to be even bigger issue. Some of them even so at this point appeared that there could be difficult to even get out. 8/17; this is the first time I am seeing this in about 3 weeks. The patient had stop by my office yesterday to discuss after completing hyperbaric therapy. Based on what she said I was expecting the worst today however apparently with dressing changes and perhaps debridement last week the surface of the wound actually looks better than what I was expecting. There is considerable depth although the last time I saw this the base of the wound was necrotic there is still some necrotic tissue on the surface of the wound bed however in general the rest the tissue looks healthy even with some bleeding. She has been using cleaning the wound with Dakin's and then applying Prisma and then backing Dakin's wet-to-dry. This seems to work to clean the wound surface up. They also ordered a wound VAC and I discussed this with her in depth today She is not systemically unwell. She is not running a fever there is no odor. She talks about pain over the lower part of the flap to touch. This does not appear to be related to movement of her shoulder. Objective Constitutional Sitting or standing Blood Pressure is within target range for patient.. Pulse regular and within target range for patient.Marland Kitchen Respirations regular, non-labored and within target range.. Temperature is normal and within the target range for the patient.Marland Kitchen Appears in no distress. Vitals Time Taken: 7:56 AM, Height: 67 in, Weight: 257  lbs, BMI: 40.2, Temperature: 98.0 F, Pulse: 92 bpm, Respiratory Rate: 18 breaths/min, Blood Pressure: 113/79  mmHg. General Notes: Wound exam; upper part of her surgical incision with close proximity to the anterior axillary line. The wound itself is fairly large with significant depth. However there is much better wound surface. Still some loose slough at the base of this I did not debride this. No evidence of surrounding infection. With regards to the tissue below the wound area everything here look completely normal no erythema no crepitus and although that she said there was some tenderness that did not appear to be either compromised or infected. No mechanical debridement today. The suture line looks clean I note that our staff remove the sutures when she was last seen 8 days ago Integumentary (Hair, Skin) Wound #1 status is Open. Original cause of wound was Surgical Injury. The date acquired was: 01/16/2020. The wound has been in treatment 19 weeks. The wound is located on the Left Chest. There is Fat Layer (Subcutaneous Tissue) exposed. There is a large amount of serosanguineous drainage noted. The wound margin is distinct with the outline attached to the wound base. There is medium (34-66%) red, pink granulation within the wound bed. There is a medium (34-66%) amount of necrotic tissue within the wound bed including Adherent Slough. 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 - Tuesday with Dr. Dellia Nims Nurse Visit: - Vac dressing change Friday 08/23/20 Bathing/ Shower/ Hygiene: May shower with protection but do not get wound dressing(s) wet. Negative Presssure Wound Therapy: Wound Vac to wound continuously at 111mm/hg pressure Black Foam - Apply Santyl to slough on base then  apply black foam WOUND #1: - Chest Wound Laterality: Left Cleanser: Wound Cleanser 1 x Per Day/15 Days Discharge Instructions: Cleanse the wound with wound cleanser prior to applying a clean dressing using gauze sponges, not tissue or cotton balls. Prim Dressing: Santyl Ointment 1 x Per Day/15 Days ary Discharge Instructions: Apply to base under vac sponge 1. The wound actually look better than I was expecting to see today based on my discussion with her yesterday 2. Things are apparently a lot better looking than the last time she was seen according to the patient. No evidence of infection 3. Although there is some concern about vascular supply and an area like this I think attempting a wound VAC is certainly reasonable. At this point were going to go ahead with the usual negative pressure. We will keep a close eye on this and actually were going to change it in our clinic twice a week so we can be certain that things are going to deteriorate. The other issue is the proximity to the anterior axillary line vis--vis a seal however we can only try. 4. The tender area well below the wound does not seem to have a lot of worrisome features. There is no erythema no crepitus for now I am going to simply continue to monitor this. 5. I will flip a note to Dr. Claudia Desanctis that we are going to try wound VAC 6. Continue HBO which certainly seems to help the viability of her tissue Electronic Signature(s) Signed: 08/21/2020 5:36:21 PM By: [name] MD Entered By: [name] on 08/21/2020 08:39:13 -------------------------------------------------------------------------------- SuperBill Details Patient Name: Date  of Service: [name], [name] 08/21/2020 Medical Record Number: [id number] Patient Account Number: [id number] Date of Birth/Sex: Treating RN: 1970-05-03 (50 y.o. 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 Facility Procedures CPT4 Code: 25003704 Description: 88891 NEG PRESS WND TX <=50 SQ CM ICD-10 Diagnosis Description T81.31XD Disruption of external operation (surgical) wound, not elsewhere classified, sub Modifier: sequent encounter Quantity: 1 Physician Procedures : CPT4 Code Description Modifier [id number] - WC PHYS LEVEL 4 - EST PT ICD-10 Diagnosis Description T81.31XD Disruption of external operation (surgical) wound, not elsewhere classified, subsequent encounter 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  and subcutaneous tissue related to radiation 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 Quantity: 1 Electronic Signature(s) Signed: 08/21/2020 5:36:21 PM By: [name] MD Entered By: [name] on 08/21/2020 08:39:45

## 2020-08-21 NOTE — Progress Notes (Signed)
[name], [name] ([id number]) Visit Report for 08/21/2020 Problem List Details Patient Name: Date of Service: [name] 08/21/2020 10:00 A M Medical Record Number: [id number] Patient Account Number: [id number] Date of Birth/Sex: Treating RN: [birth year] (51 y.o. 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 08/21/2020 Medical Record Number: [id number] Patient Account Number: [id number] Date of Birth/Sex: Treating RN: 1970-09-20 (50 y.o. 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 Facility  Procedures CPT4 Code: [id number] Description: G0277-(Facility Use Only) HBOT full body chamber, 73mn , ICD-10 Diagnosis Description T81.31XD Disruption of external operation (surgical) wound, not elsewhere classified, subse 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 and subcutaneous  tissue 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 Modifier: quent encounter Quantity: 4 Physician Procedures : CPT4 Code Description Modifier [id number] PHYS HYPERBARIC OXYGEN THERAPY ICD-10 Diagnosis Description T81.31XD Disruption of external operation (surgical) wound, not elsewhere classified, subsequent encounter L59.8 Other specified disorders of  the skin and subcutaneous tissue 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

## 2020-08-22 ENCOUNTER — Encounter (HOSPITAL_BASED_OUTPATIENT_CLINIC_OR_DEPARTMENT_OTHER): Payer: Medicaid Other | Admitting: Internal Medicine

## 2020-08-23 ENCOUNTER — Other Ambulatory Visit (HOSPITAL_COMMUNITY): Payer: Self-pay

## 2020-08-23 ENCOUNTER — Encounter (HOSPITAL_BASED_OUTPATIENT_CLINIC_OR_DEPARTMENT_OTHER): Payer: Medicaid Other | Admitting: Internal Medicine

## 2020-08-23 ENCOUNTER — Other Ambulatory Visit: Payer: Self-pay

## 2020-08-23 DIAGNOSIS — T8131XD Disruption of external operation (surgical) wound, not elsewhere classified, subsequent encounter: Secondary | ICD-10-CM | POA: Diagnosis not present

## 2020-08-23 DIAGNOSIS — L598 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: Secondary | ICD-10-CM

## 2020-08-23 DIAGNOSIS — C50412 Malignant neoplasm of upper-outer quadrant of left female breast: Secondary | ICD-10-CM | POA: Diagnosis not present

## 2020-08-23 NOTE — Progress Notes (Signed)
RONITA, BEHNER ([id number]) Visit Report for 08/23/2020 Arrival Information Details Patient Name: Date of Service: [name] 08/23/2020 8:00 A M Medical Record Number: [id number] Patient Account Number: [id number] Date of Birth/Sex: Treating RN: 21-Nov-1970 (50 y.o. [name], [name] Primary Care Gurtej Noyola: [name] Other Clinician: Referring Parlee Amescua: Treating Matty Vanroekel/Extender: [name] in Treatment: 64 Visit Information History Since Last Visit Added or deleted any medications: No Patient Arrived: Ambulatory Any new allergies or adverse reactions: No Arrival Time: 08:16 Had a fall or experienced change in No Accompanied By: self activities of daily living that may affect Transfer Assistance: None risk of falls: Patient Identification Verified: Yes Signs or symptoms of abuse/neglect since last visito No Secondary Verification Process Completed: Yes Hospitalized since last visit: No Patient Requires Transmission-Based Precautions: No Implantable device outside of the clinic excluding No Patient Has Alerts: No cellular tissue based products placed in the center since last visit: Has Dressing in Place as Prescribed: Yes Pain Present Now: No Electronic Signature(s) Signed: 08/23/2020 12:21:32 PM By: Deon Pilling Entered By: Deon Pilling on 08/23/2020 08:20:28 -------------------------------------------------------------------------------- Encounter Discharge Information Details Patient Name: Date of Service: [name] 08/23/2020 8:00 A M Medical Record Number: [id number] Patient Account Number: [id number] Date of Birth/Sex: Treating RN: 1970/04/23 (50 y.o. [name] Primary Care Vartan Kerins: [name] Other Clinician: Referring Brenya Taulbee: Treating Galen Russman/Extender: [name] in Treatment: 19 Encounter Discharge Information Items Discharge Condition: Stable Ambulatory  Status: Ambulatory Discharge Destination: Other (Note Required) Telephoned: No Orders Sent: No Transportation: Private Auto Accompanied By: self Schedule Follow-up Appointment: Yes Clinical Summary of Care: Notes HBO tx after wound care visit. Electronic Signature(s) Signed: 08/23/2020 12:21:32 PM By: Deon Pilling Entered By: Deon Pilling on 08/23/2020 08:45:16 -------------------------------------------------------------------------------- Negative Pressure Wound Therapy Maintenance (NPWT) Details Patient Name: Date of Service: [name], [name] 08/23/2020 8:00 A M Medical Record Number: [id number] Patient Account Number: [id number] Date of Birth/Sex: Treating RN: [birth year] (50 y.o. [name], [name] Primary Care Henna Derderian: [name] Other Clinician: Referring Jyair Kiraly: Treating Ashwini Jago/Extender: [name] in Treatment: 19 NPWT Maintenance Performed for: Wound #1 Left Chest Performed By: Deon Pilling, RN Type: VAC System Coverage Size (sq cm): 8.7 Pressure Type: Constant Pressure Setting: 125 mmHG Drain Type: None Primary Contact: Non-Adherent Sponge/Dressing Type: Foam, Black Date Initiated: 08/21/2020 Dressing Removed: Yes Quantity of Sponges/Gauze Removed: x1 black foam Canister Changed: Yes Canister Exudate Volume: 25 Dressing Reapplied: No Quantity of Sponges/Gauze Inserted: x1 black foam Respones T Treatment: o tolerated well Days On NPWT : 3 Notes duoderm and x2 2x2 gauze to left side of wound in order to have no leaks with wound vac. Electronic Signature(s) Signed: 08/23/2020 12:21:32 PM By: Deon Pilling Entered By: Deon Pilling on 08/23/2020 08:41:53 -------------------------------------------------------------------------------- Patient/Caregiver Education Details Patient Name: Date of Service: [name] 8/19/2022andnbsp8:00 A M Medical Record Number: [id number] Patient Account Number:  [id number] Date of Birth/Gender: Treating RN: Oct 17, 1970 (50 y.o. [name] Primary Care Physician: [name] Other Clinician: Referring Physician: Treating Physician/Extender: [name] in Treatment: 19 Education Assessment Education Provided To: Patient Education Topics Provided Wound/Skin Impairment: Handouts: Skin Care Do's and Dont's Methods: Explain/Verbal Responses: Reinforcements needed Electronic Signature(s) Signed: 08/23/2020 12:21:32 PM By: Deon Pilling Entered By: Deon Pilling on 08/23/2020 08:44:30 -------------------------------------------------------------------------------- Wound Assessment Details Patient Name: Date of Service: [name], LO  V [name] 08/23/2020 8:00 A M Medical Record Number: [id number] Patient Account Number: [id number] Date of Birth/Sex: Treating RN: 24-May-1970 (50 y.o. [name], [name] Primary Care Maleiyah Releford: [name] Other Clinician: Referring Levette Paulick: Treating Antonio Creswell/Extender: [name] in Treatment: 19 Wound Status Wound Number: 1 Primary Open Surgical Wound Etiology: Wound Location: Left Chest Secondary Necrosis (Radiation) Wounding Event: Surgical Injury Etiology: Date Acquired: 01/16/2020 Wound Status: Open Weeks Of Treatment: 19 Comorbid Hypertension, Osteoarthritis, Received Chemotherapy, Clustered Wound: No History: Received Radiation Wound Measurements Length: (cm) 2.9 Width: (cm) 3 Depth: (cm) 1.7 Area: (cm) 6.833 Volume: (cm) 11.616 % Reduction in Area: -7.4% % Reduction in Volume: -66% Epithelialization: None Undermining: Yes Starting Position (o'clock): 11 Ending Position (o'clock): 3 Maximum Distance: (cm) 0.8 Wound Description Classification: Full Thickness Without Exposed Support Structures Wound Margin: Distinct, outline attached Exudate Amount: Large Exudate Type: Serosanguineous Exudate Color: red, brown Foul  Odor After Cleansing: No Slough/Fibrino Yes Wound Bed Granulation Amount: Medium (34-66%) Exposed Structure Granulation Quality: Red, Pink Fascia Exposed: No Necrotic Amount: Medium (34-66%) Fat Layer (Subcutaneous Tissue) Exposed: Yes Necrotic Quality: Adherent Slough Tendon Exposed: No Muscle Exposed: No Joint Exposed: No Bone Exposed: No Treatment Notes Wound #1 (Chest) Wound Laterality: Left Cleanser Wound Cleanser Discharge Instruction: Cleanse the wound with wound cleanser prior to applying a clean dressing using gauze sponges, not tissue or cotton balls. Peri-Wound Care Topical Primary Dressing Santyl Ointment Discharge Instruction: Apply to base under vac sponge Secondary Dressing Secured With Compression Wrap Compression Stockings Add-Ons Notes wound vac 165mHg applied, see procedure note. Electronic Signature(s) Signed: 08/23/2020 12:21:32 PM By: DDeon PillingEntered By: DDeon Pillingon 08/23/2020 08:25:45 -------------------------------------------------------------------------------- Vitals Details Patient Name: Date of Service: APecola Leisure [name] 08/23/2020 8:00 A M Medical Record Number: [id number] Account Number: [id number] of Birth/Sex: Treating RN: 621-May-1972(50y.o. FHelene Shoe BMeta.RedingPrimary Care Lisbeth Puller: JKarle PlumberOther Clinician: Referring Roshelle Traub: Treating Keela Rubert/Extender: HSammuel Bailiffin Treatment: 19 Vital Signs Time Taken: 08:20 Temperature (F): 98.1 Height (in): 67 Pulse (bpm): 99 Weight (lbs): 257 Respiratory Rate (breaths/min): 18 Body Mass Index (BMI): 40.2 Blood Pressure (mmHg): 137/89 Reference Range: 80 - 120 mg / dl Electronic Signature(s) Signed: 08/23/2020 12:21:32 PM By: DDeon PillingEntered By: DDeon Pillingon 08/23/2020 08:21:34

## 2020-08-23 NOTE — Progress Notes (Signed)
CHARESSE, [name] ([id number]) Visit Report for 08/23/2020 SuperBill Details Patient Name: Date of Service: [name] 08/23/2020 Medical Record Number: [id number] Patient Account Number: [id number] Date of Birth/Sex: Treating RN: 1970-01-29 (50 y.o. 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

## 2020-08-26 ENCOUNTER — Encounter (HOSPITAL_BASED_OUTPATIENT_CLINIC_OR_DEPARTMENT_OTHER): Payer: Medicaid Other | Admitting: Internal Medicine

## 2020-08-26 NOTE — Progress Notes (Signed)
JULEANA, COOKE ([id number]) Visit Report for 08/21/2020 Arrival Information Details Patient Name: Date of Service: [name] 08/21/2020 10:00 A M Medical Record Number: [id number] Patient Account Number: [id number] Date of Birth/Sex: Treating RN: 1970/06/18 (50 y.o. F) Primary Care Quinci Gavidia: [name] Other Clinician: [name] Referring Gitel Beste: Treating Shenna Brissette/Extender: Randon Goldsmith in Treatment: 71 Visit Information History Since Last Visit All ordered tests and consults were completed: Yes Patient Arrived: Ambulatory Added or deleted any medications: No Arrival Time: 08:40 Any new allergies or adverse reactions: No Accompanied By: self Had a fall or experienced change in No Transfer Assistance: None activities of daily living that may affect Patient Identification Verified: Yes risk of falls: Secondary Verification Process Completed: Yes Signs or symptoms of abuse/neglect since last visito No Patient Requires Transmission-Based Precautions: No Hospitalized since last visit: No Patient Has Alerts: No Implantable device outside of the clinic excluding No cellular tissue based products placed in the center since last visit: Pain Present Now: No Electronic Signature(s) Signed: 08/26/2020 9:53:06 AM By: [name] EMT Entered By: [name] on 08/21/2020 11:49:22 -------------------------------------------------------------------------------- Encounter Discharge Information Details Patient Name: Date of Service: [name] 08/21/2020 10:00 A M Medical Record Number: [id number] Patient Account Number: [id number] Date of Birth/Sex: Treating RN: 1970-02-06 (50 y.o. F) Primary Care Mahmoud Blazejewski: [name] Other Clinician: [name] Referring Gianny Killman: Treating Pebbles Zeiders/Extender: Randon Goldsmith in Treatment: 19 Encounter Discharge Information Items Discharge  Condition: Stable Ambulatory Status: Ambulatory Discharge Destination: Home Transportation: Private Auto Accompanied By: self Schedule Follow-up Appointment: No Clinical Summary of Care: Electronic Signature(s) Signed: 08/26/2020 9:53:06 AM By: [name] EMT Entered By: [name] on 08/21/2020 11:53:43 -------------------------------------------------------------------------------- Vitals Details Patient Name: Date of Service: [name] 08/21/2020 10:00 A M Medical Record Number: [id number] Patient Account Number: [id number] Date of Birth/Sex: Treating RN: 1970/08/29 (50 y.o. F) Primary Care Gillie Crisci: [name] Other Clinician: [name] Referring Anieya Helman: Treating Yazleen Molock/Extender: Randon Goldsmith in Treatment: 19 Vital Signs Time Taken: 09:04 Temperature (F): 98.0 Height (in): 67 Pulse (bpm): 92 Weight (lbs): 257 Respiratory Rate (breaths/min): 18 Body Mass Index (BMI): 40.2 Blood Pressure (mmHg): 121/93 Reference Range: 80 - 120 mg / dl Airway Pulse Oximetry (%): 100 Electronic Signature(s) Signed: 08/26/2020 9:53:06 AM By: [name] EMT Entered By: [name] on 08/21/2020 11:50:38

## 2020-08-26 NOTE — Progress Notes (Signed)
[name], [name] ([id number]) Visit Report for 08/23/2020 SuperBill Details Patient Name: Date of Service: [name] 08/23/2020 Medical Record Number: [id number] Patient Account Number: [id number] Date of Birth/Sex: Treating RN: [birth year] (50 y.o. 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 Facility Procedures CPT4 Code Description Modifier Quantity [id number] G0277-(Facility Use Only) HBOT full body chamber, 9mn , 4 ICD-10 Diagnosis Description T81.31XD Disruption of external operation (surgical) wound, not elsewhere classified, subsequent encounter 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 Physician Procedures Quantity CPT4 Code Description Modifier [id number] PHYS HYPERBARIC OXYGEN THERAPY 1 ICD-10 Diagnosis Description T81.31XD Disruption of external operation (surgical) wound, not elsewhere classified, subsequent encounter 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

## 2020-08-26 NOTE — Progress Notes (Signed)
JERYN, RAYBORN ([id number]) Visit Report for 08/21/2020 HBO Details Patient Name: Date of Service: [name] 08/21/2020 10:00 A M Medical Record Number: [id number] Patient Account Number: [id number] Date of Birth/Sex: Treating RN: 03-09-1970 (50 y.o. 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 HBO Treatment Details Treatment Number: 17 Patient Type: Outpatient Chamber Type: Monoplace Chamber Serial #: [id number] Treatment Protocol: 2.5 ATA with 90 minutes oxygen, with two 5 minute air breaks Treatment Details Compression Rate Down: 2.0 psi / minute De-Compression Rate Up: 2.0 psi / minute A breaks and breathing ir Compress Tx Pressure periods Decompress Decompress Begins Reached (leave unused spaces Begins Ends blank) Chamber Pressure (ATA 1 2.5 2.5 2.5 2.5 2.5 - - 2.5 1 ) Clock Time (24 hr) 09:09 09:21 09:51 09:56 10:26 10:31 - - 11:01 11:12 Treatment Length: 123 (minutes) Treatment Segments: 4 Vital Signs Capillary Blood Glucose Reference Range: 80 - 120 mg / dl HBO Diabetic Blood Glucose Intervention Range: <131 mg/dl or >249 mg/dl Time Vitals Blood Respiratory Capillary Blood Glucose Pulse Action Type: Pulse: Temperature: Taken: Pressure: Rate: Glucose (mg/dl): Meter #: Oximetry (%) Taken: Pre 09:04 121/93 92 18 98 100 Post 11:14 126/79 80 18 97.9 100 Treatment Response Treatment Toleration: Well Treatment Completion Status: Treatment Completed without Adverse Event Physician HBO Attestation: I certify that I supervised this HBO treatment in accordance with Medicare guidelines. A trained emergency  response team is readily available per Yes hospital policies and procedures. Continue HBOT as ordered. Yes Electronic Signature(s) Signed: 08/21/2020 4:51:05 PM By: Worthy Keeler PA-C Entered By: Worthy Keeler on 08/21/2020 16:51:04 -------------------------------------------------------------------------------- HBO Safety Checklist Details Patient Name: Date of Service: [name] 08/21/2020 10:00 A M Medical Record Number: [id number] Patient Account Number: [id number] Date of Birth/Sex: Treating RN: [birth year] (50 y.o. F) Primary Care Larance Ratledge: [name] Other Clinician: [name] Referring Breyanna Valera: Treating Jonte Wollam/Extender: Randon Goldsmith in Treatment: 19 HBO Safety Checklist Items Safety Checklist Consent Form Signed Patient voided / foley secured and emptied When did you last eato snack this AM Last dose of injectable or oral agent n/a Ostomy pouch emptied and vented if applicable NA All implantable devices assessed, documented and approved NA Intravenous access site secured and place NA Valuables secured Linens and cotton and cotton/polyester blend (less than 51% polyester) Personal oil-based products / skin lotions / body lotions removed Wigs or hairpieces removed NA Smoking or tobacco materials removed NA Books / newspapers / magazines / loose paper removed Cologne, aftershave, perfume and deodorant removed Jewelry removed (may wrap wedding band) Make-up removed Hair care products removed Battery operated devices (external) removed Heating patches and chemical warmers removed Titanium eyewear removed NA Nail polish cured greater than 10 hours Casting material cured greater than 10 hours NA Hearing aids removed NA Loose dentures or partials removed Prosthetics have been removed NA Patient demonstrates correct use of air break device (if applicable) Patient concerns have been addressed Patient  grounding bracelet on and cord attached to chamber Specifics for Inpatients (complete in addition to above) Medication sheet sent with patient NA Intravenous medications needed or due during therapy sent with patient NA Drainage tubes (e.g. nasogastric tube or  chest tube secured and vented) NA Endotracheal or Tracheotomy tube secured NA Cuff deflated of air and inflated with saline NA Airway suctioned NA Electronic Signature(s) Signed: 08/26/2020 9:53:06 AM By: [name] EMT Entered By: [name] on 08/21/2020 11:51:53

## 2020-08-26 NOTE — Progress Notes (Signed)
[name], [name] ([id number]) Visit Report for 08/23/2020 Arrival Information Details Patient Name: Date of Service: [name] [name] 08/23/2020 10:00 Sarah M Medical Record Number: [id number] Patient Account Number: [id number] Date of Birth/Sex: Treating RN: [name] (50 y.o. F) Primary Care [name]: [name] Other Clinician: [name] Referring Amyria Komar: Treating Rickie Gange/Extender: [name] in Treatment: 84 Visit Information History Since Last Visit All ordered tests and consults were completed: Yes Patient Arrived: Ambulatory Added or deleted any medications: No Arrival Time: 08:16 Any new allergies or adverse reactions: No Accompanied By: self Had Sarah fall or experienced change in No Transfer Assistance: None activities of daily living that may affect Patient Identification Verified: Yes risk of falls: Secondary Verification Process Completed: Yes Signs or symptoms of abuse/neglect since last visito No Patient Requires Transmission-Based Precautions: No Hospitalized since last visit: No Patient Has Alerts: No Implantable device outside of the clinic excluding No cellular tissue based products placed in the center since last visit: Pain Present Now: No Electronic Signature(s) Signed: 08/26/2020 9:53:06 AM By: [name] EMT Entered By: [name] on 08/23/2020 09:08:Khan -------------------------------------------------------------------------------- Encounter Discharge Information Details Patient Name: Date of Service: Sarah Crochet V [name] 08/23/2020 10:00 Sarah M Medical Record Number: [id number] Patient Account Number: [id number] Date of Birth/Sex: Treating RN: [name] (50 y.o. F) Primary Care [name]: [name] Other Clinician: [name] Referring Grafton Warzecha: Treating Jaylea Plourde/Extender: [name] in Treatment: 19 Encounter Discharge Information  Items Discharge Condition: Stable Ambulatory Status: Ambulatory Discharge Destination: Home Transportation: Private Auto Accompanied By: self Schedule Follow-up Appointment: No Clinical Summary of Care: Electronic Signature(s) Signed: 08/26/2020 9:53:06 AM By: [name] EMT Entered By: [name] on 08/23/2020 11:17:58 -------------------------------------------------------------------------------- Vitals Details Patient Name: Date of Service: Sarah Crochet V [name] 08/23/2020 10:00 Sarah M Medical Record Number: [id number] Patient Account Number: [id number] Date of Birth/Sex: Treating RN: [name], [name] (50 y.o. F) Primary Care Maximus Hoffert: [name] Other Clinician: [name] Referring Berlyn Saylor: Treating Mort Smelser/Extender: [name] in Treatment: 19 Vital Signs Time Taken: 08:20 Temperature (F): 98.1 Height (in): 67 Pulse (bpm): 99 Weight (lbs): 257 Respiratory Rate (breaths/min): 18 Body Mass Index (BMI): 40.2 Blood Pressure (mmHg): 137/89 Reference Range: 80 - 120 mg / dl Electronic Signature(s) Signed: 08/26/2020 9:53:06 AM By: [name] EMT Entered By: [name] on 08/23/2020 09:08:57

## 2020-08-26 NOTE — Progress Notes (Signed)
CHARLEE, DRESCH ([id number]) Visit Report for 08/23/2020 HBO Details Patient Name: Date of Service: [name] 08/23/2020 10:00 A M Medical Record Number: [id number] Patient Account Number: [id number] Date of Birth/Sex: Treating RN: 1970/09/01 (50 y.o. 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 HBO Treatment Details Treatment Number: 18 Patient Type: Outpatient Chamber Type: Monoplace Chamber Serial #: [id number] Treatment Protocol: 2.5 ATA with 90 minutes oxygen, with two 5 minute air breaks Treatment Details Compression Rate Down: 2.0 psi / minute De-Compression Rate Up: A breaks and breathing ir Compress Tx Pressure periods Decompress Decompress Begins Reached (leave unused spaces Begins Ends blank) Chamber Pressure (ATA 1 2.5 2.5 2.5 2.5 2.5 - - 2.5 1 ) Clock Time (24 hr) 09:02 09:14 09:44 09:49 10:19 10:24 - - 10:54 11:03 Treatment Length: 121 (minutes) Treatment Segments: 4 Vital Signs Capillary Blood Glucose Reference Range: 80 - 120 mg / dl HBO Diabetic Blood Glucose Intervention Range: <131 mg/dl or >249 mg/dl Time Vitals Blood Respiratory Capillary Blood Glucose Pulse Action Type: Pulse: Temperature: Taken: Pressure: Rate: Glucose (mg/dl): Meter #: Oximetry (%) Taken: Pre 08:20 137/89 99 18 98.1 Post 11:06 125/81 77 18 97.8 Treatment Response Treatment Toleration: Well Treatment Completion Status: Treatment Completed without Adverse Event Physician HBO Attestation: I certify that I supervised this HBO treatment in accordance with Medicare guidelines. A trained emergency response team is readily  available per Yes hospital policies and procedures. Continue HBOT as ordered. Yes Electronic Signature(s) Signed: 08/23/2020 12:25:23 PM By: Kalman Shan DO Entered By: Kalman Shan on 08/23/2020 12:24:45 -------------------------------------------------------------------------------- HBO Safety Checklist Details Patient Name: Date of Service: [name] 08/23/2020 10:00 A M Medical Record Number: [id number] Patient Account Number: [id number] Date of Birth/Sex: Treating RN: [birth year] (50 y.o. F) Primary Care Nyazia Canevari: [name] Other Clinician: [name] Referring Eivan Gallina: Treating Layah Skousen/Extender: [name] in Treatment: 19 HBO Safety Checklist Items Safety Checklist Consent Form Signed Patient voided / foley secured and emptied When did you last eato snack this AM Last dose of injectable or oral agent n/a Ostomy pouch emptied and vented if applicable NA All implantable devices assessed, documented and approved NA Intravenous access site secured and place NA Valuables secured Linens and cotton and cotton/polyester blend (less than 51% polyester) Personal oil-based products / skin lotions / body lotions removed Wigs or hairpieces removed NA Smoking or tobacco materials removed NA Books / newspapers / magazines / loose paper removed Cologne, aftershave, perfume and deodorant removed Jewelry removed (may wrap wedding band) Make-up removed Hair care products removed Battery operated devices (external) removed Heating patches and chemical warmers removed Titanium eyewear removed NA Nail polish cured greater than 10 hours Casting material cured greater than 10 hours NA Hearing aids removed NA Loose dentures or partials removed Prosthetics have been removed NA Patient demonstrates correct use of air break device (if applicable) Patient concerns have been addressed Patient grounding bracelet on and  cord attached to chamber Specifics for Inpatients (complete in addition to above) Medication sheet sent with patient NA Intravenous medications needed or due during therapy sent with patient NA Drainage tubes (e.g. nasogastric tube or chest tube secured and vented) NA Endotracheal or Tracheotomy tube  secured NA Cuff deflated of air and inflated with saline NA Airway suctioned NA Electronic Signature(s) Signed: 08/26/2020 9:53:06 AM By: [name] EMT Entered By: [name] on 08/23/2020 08:59:49

## 2020-08-27 ENCOUNTER — Other Ambulatory Visit: Payer: Self-pay

## 2020-08-27 ENCOUNTER — Encounter (HOSPITAL_BASED_OUTPATIENT_CLINIC_OR_DEPARTMENT_OTHER): Payer: Medicaid Other | Admitting: Internal Medicine

## 2020-08-27 DIAGNOSIS — C50412 Malignant neoplasm of upper-outer quadrant of left female breast: Secondary | ICD-10-CM | POA: Diagnosis not present

## 2020-08-28 ENCOUNTER — Encounter (HOSPITAL_BASED_OUTPATIENT_CLINIC_OR_DEPARTMENT_OTHER): Payer: Medicaid Other | Admitting: Physician Assistant

## 2020-08-28 NOTE — Progress Notes (Addendum)
Sarah, Khan ([id number]) Visit Report for 08/27/2020 HPI Details Patient Name: Date of Service: [name] [name] 08/27/2020 9:00 Sarah M Medical Record Number: [id number] Patient Account Number: [id number] Date of Birth/Sex: Treating RN: Dec 14, 1970 (50 y.o. [name], [name] Primary Care Provider: [name] Other Clinician: Referring Provider: Treating Provider/Extender: [name] in Treatment: 20 History of Present Illness HPI Description: ADMISSION 04/09/2020 [name] 50 year old woman who is history of breast cancer in the left breast goes back to 2018. He was found to have Sarah mass in her left breast. [name] and then subsequent chemo that she did not tolerate very well. She had seed implants placed but then underwent external beam Sarah radiation in the spring 2019. 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 Her adjunctive radiation was from 03/11/2017 through 04/21/2017. Unfortunately she had Sarah left breast biopsy on 11/24/2019 and it was again positive for an invasive ductal carcinoma grade 3 he is to resume at receptor 80% positive. HER-2 negative progesterone negative. She opted for bilateral mastectomies which were done on 01/09/2020 on the right with no evidence of malignancy on the left stage IIb invasive ductal carcinoma. She again received chemotherapy but she did not tolerate it because of wound dehiscence and low platelet counts according to the patient. Unfortunately her mastectomy wound dehisced on the left breast. She was taken back to the OR by [name] of general surgery on 33/24/22 and she underwent Sarah debridement and subsequent attempt at closure. She had been previously treated with Augmentin for suspected underlying infection. Once again the wound has dehisced in the mid  aspect. It is roughly 1 cm deep there is tunnels both medially and laterally she. She still has sutures in place. Still describes constant pain drainage. She also has Sarah drain still in place that drains about 10 cc/day. Currently she is changing ABD pad several times Sarah day 4/12; patient went back to see [name] who remove most of the stitches and debrided her wound. She has been using silver alginate culture I did last time which was Sarah PCR culture showed Peptostreptococcus, Enterococcus faecalis and Staphylococcus epidermidis which is of questionable significance. [name] that the sutures have been removed. There are undermining areas superior medial superior lateral and Sarah deep tunneling area in the inferior part of the wound. There is no obvious purulence. I started her on Augmentin on 04/12/2020 and I will likely extend this for another week AMAZINGLY; she is already been approved for HBO and will start today. [name] Echocardiogram which I reviewed from 2019 showed Sarah normal ejection fraction.. I went over side effects of HBO with the patient and answered any of her questions. Her husband was present 5/3; [name]. She went to see [name] about 2-1/2 weeks ago according to the patient he can remove some of the surrounding debris. She will see him again later this week or early next week. She has been using silver alginate. Sarah lot of this is problematic since she has Medicaid which traditionally does not cover wound care supplies although it traditionally does not cover HBO either Her wound actually looks quite Sarah bit better than the last time I saw this better looking surface healthy looking  tissue. She has tunnels at 810 2 in 6 the deepest of these is at 2 at 2.2 cm there is no exposed bone. 5/10; the patient was coming in for nurse visit today but requested that I see the wound because of surface discoloration. She  states the Iodoflex makes the surface of this turned "white". She is not really complaining of undue tenderness. She is changing this every second day. The goal of this had been to get the surface of the wound to Sarah vibrant red color and then potentially look at changing to Sarah silver collagen-based dressing, Puraply, and then perhaps an alternative skin substituteo Derma vest. The patient tells me she has an appoint with her oncologist [name] on the 19th. She apparently think she is going to get another PET scan. Based on this I would like his comment about whether there is possibility of any malignancy in the wound itself or just underneath it. Up to now we have not really thought that was true 5/17; nice improvement in the chest wound. Less depth. We have been using Iodoflex to clean off the wound surface this seems to have done nicely. Patient went to see [name] 2 days ago who has referred her to see Dr. Claudia Desanctis. I certainly agree with the thought the surface of the wound looks Sarah lot better now and I think consideration of plastic surgery is certainly something the patient should discuss. I have talked to the patient about this today and I certainly support Sarah conversation with Dr. Claudia Desanctis. I also wondered about is there any residual cancer in this. [name] apparently did not seem to think so per the patient although I do not see that in the text conversations we have had. I also wonder about Sarah skin substitute if her version of Medicare [[employer]] would cover this. [name] is apparently considering starting her on [place]. Patient is already very scared about the side effects but she will discuss this with [name] on 5/19 5/31; patient's wound about the same in terms of size she still has the tunneling area to the left. Surface of the wound looks Sarah lot better there has been some filling in the granulation I have been included on serial text between Dr. Callie Fielding surgery]  Dr. Ruffin Frederick oncology] and Dr. Para Skeans surgery]. I think there is not Sarah lot of thought that the patient has recurrence of her cancer her MRI was indeterminant but [name] did not feel that there was any reason to think she has Sarah major recurrence and he was not in favor of doing Sarah dedicated breast MRI. Dr. Claudia Desanctis wants to go ahead with Sarah flap closure I did it. I have not reviewed his note but per the patient [name] latissimus dorsi myocutaneous flap or I think Sarah skin flap from her abdomen. I have talked to the patient I think it is time to attempt this. If I can continue hyperbarics surrounding the flap then we will attempt to do this 6/14; patient using Prisma as the primary dressing here. She has completed hyperbarics and did very well. Her wound is responded nicely. The depth of this and the condition of the granulation tissue looks Sarah lot better. She has undermining areas at 2 and 10:00 both of these seem to be better as well. She is scheduled for flap closure on 7/11 by Dr. Claudia Desanctis 6/29; the patient continues to use Prisma wet-to-dry. Her graft is scheduled for 7/11 by  Dr. Claudia Desanctis. We are looking at getting her additional hyperbaric treatment which I think might help with the likelihood of graft survival in this difficult irradiated area 7/21; patient returns to clinic to reinitiate HBO after her surgical procedure by Dr. Claudia Desanctis. On 07/15/2020 she had debridement of left chest wound and Sarah rotational flap closure. She also had excision of right excess axillary tissue. Pathology did not show any malignancy and follow-up Dr. Claudia Desanctis saw the patient yesterday. He noted Sarah 2 cm area of wound dehiscence. Recommended she return for hyperbaric oxygen and she returns today for her first treatment. Follow-up with him in 2 weeks. We were not actually going to see the patient today her appointment to be evaluated was Monday however she was concerned about Sarah "fishy odor". She is also noted some  drainage. Not specifically dressing in the wound area but covering with Adaptic 7/25; I am seeing this wound and follow-up. So far her culture is polymicrobial not growing any specific anything specific nevertheless with the Augmentin and the silver alginate her odor is Sarah lot better. Not as much periwound tenderness. Dimensions measuring 2.7 x 3.3 x 1.6. She has undermining from 10-1 o'clock of about 1 and half centimeters. Necrotic fat on the surface of this... 08/13/2020 upon evaluation today patient appears to be doing decently well in regard to her wound all things considered. Obviously this is much more open than what was hoped for based on the fact that she had Sarah flap performed which was supposed to completely close this wound. Nonetheless currently she tells me that she is not really having any significant pain fortunately the drainage is improved compared to last time there is still some evidence of odor as far as the drainage is concerned but is definitely not as purulent as what was previously noted. With that being said I do believe that there are quite Sarah few sutures which are quite embedded. Nonetheless I believe this needs to be removed. In fact if they are not I am afraid this is again actually grow into the skin and that is can to be even bigger issue. Some of them even so at this point appeared that there could be difficult to even get out. 8/17; this is the first time I am seeing this in about 3 weeks. The patient had stop by my office yesterday to discuss after completing hyperbaric therapy. Based on what she said I was expecting the worst today however apparently with dressing changes and perhaps debridement last week the surface of the wound actually looks better than what I was expecting. There is considerable depth although the last time I saw this the base of the wound was necrotic there is still some necrotic tissue on the surface of the wound bed however in general the rest the  tissue looks healthy even with some bleeding. She has been using cleaning the wound with Dakin's and then applying Prisma and then backing Dakin's wet-to-dry. This seems to work to clean the wound surface up. They also ordered Sarah wound VAC and I discussed this with her in depth today She is not systemically unwell. She is not running Sarah fever there is no odor. She talks about pain over the lower part of the flap to touch. This does not appear to be related to movement of her shoulder. 8/23; the patient has been using the wound VAC for 1 week. Already Sarah remarkable improvement and she appears to be tolerating this well we had Santyl under  the wound VAC however the surface looks so clean today I am doubtful that is going to be necessary She is also being treated with HBO. This is helped her dramatically. Apparently her current certification will be up on 8/31. She is only had 19 dives and we will certainly need to recertify her up for this. Since I saw her last week I put in Sarah secure text message to Dr. Claudia Desanctis through epic to let him know that we were going ahead with Sarah wound VAC. He did not have any objections to this. He is offering her more surgery however in the eventuality that this does not close Electronic Signature(s) Signed: 08/28/2020 8:02:04 AM By: [name] MD Entered By: [name] on 08/27/2020 11:02:25 -------------------------------------------------------------------------------- Physical Exam Details Patient Name: Date of Service: [name], [name] 08/27/2020 9:00 Sarah M Medical Record Number: [id number] Patient Account Number: [id number] Date of Birth/Sex: Treating RN: 29-Mar-1970 (50 y.o. [name], [name] Primary Care Provider: [name] Other Clinician: Referring Provider: Treating Provider/Extender: [name] in Treatment: 20 Constitutional Sitting or standing Blood Pressure is within target range for patient.. Pulse regular  and within target range for patient.Marland Kitchen Respirations regular, non-labored and within target range.. Temperature is normal and within the target range for the patient.Marland Kitchen Appears in no distress. Notes Wound exam; wound on the upper part of her surgical incision with close proximity to the anterior axillary line. Considerable improvement in the surface of the wound there is still undermining from about 2-6 about 1 cm the rest of this looks incredibly clean healthy looking granulation. No evidence of Electronic Signature(s) Signed: 08/28/2020 8:02:04 AM By: [name] MD Entered By: [name] on 08/27/2020 11:03:59 -------------------------------------------------------------------------------- Physician Orders Details Patient Name: Date of Service: [name] 08/27/2020 9:00 Sarah M Medical Record Number: [id number] Patient Account Number: [id number] Date of Birth/Sex: Treating RN: Apr 26, 1970 (50 y.o. [name], [name] Primary Care Provider: [name] Other Clinician: Referring Provider: Treating Provider/Extender: [name] in Treatment: 20 Verbal / Phone Orders: No Diagnosis Coding Follow-up Appointments ppointment in 1 week. - Tuesday with Dr. Dellia Nims Return Sarah Nurse Visit: - Vac dressing change Friday Bathing/ Shower/ Hygiene May shower with protection but do not get wound dressing(s) wet. - May Negative Presssure Wound Therapy Wound Vac to wound continuously at 158mm/hg pressure Black Foam - Apply fibracol to wound base and undermiing under wound vac Wound Treatment Wound #1 - Chest Wound Laterality: Left Cleanser: Wound Cleanser 2 x Per Week/15 Days Discharge Instructions: Cleanse the wound with wound cleanser prior to applying Sarah clean dressing using gauze sponges, not tissue or cotton balls. Prim Dressing: FIBRACOL Plus Dressing, 2x2 in (collagen) 2 x Per Week/15 Days ary Discharge Instructions: Moisten collagen with saline or  hydrogel Electronic Signature(s) Signed: 08/27/2020 5:31:05 PM By: Rhae Hammock RN Signed: 08/28/2020 8:02:04 AM By: [name] MD Entered By: Rhae Hammock on 08/27/2020 10:24:40 -------------------------------------------------------------------------------- Problem List Details Patient Name: Date of Service: [name] 08/27/2020 9:00 Sarah M Medical Record Number: [id number] Patient Account Number: [id number] Date of Birth/Sex: Treating RN: 06-09-1970 (50 y.o. 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 08/27/2020 9:00 Sarah M Medical Record Number: [id number] Patient Account Number: [id number] Date of Birth/Sex: Treating RN: 21-Feb-1970 (50 y.o. [name], [name] Primary Care Provider: [name] Other Clinician: Referring Provider: Treating Provider/Extender: [name] in Treatment: 20 Subjective History of Present Illness (HPI) ADMISSION 04/09/2020 [name] 50 year old woman who is history of breast cancer in the left breast  goes back to 2018. He was found to have Sarah mass in her left breast. [name] and then subsequent chemo that she did not tolerate very well. She had seed implants placed but then underwent external beam Sarah radiation in the spring 2019. 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 Her adjunctive radiation was from 03/11/2017 through 04/21/2017. Unfortunately she had Sarah left breast biopsy on 11/24/2019 and it was again positive for an invasive ductal carcinoma grade 3 he is to resume at receptor 80% positive. HER-2 negative progesterone negative. She opted for bilateral mastectomies which were done on 01/09/2020 on the right with no evidence of malignancy on the left stage IIb invasive ductal carcinoma. She again received chemotherapy but she did not tolerate it because of wound dehiscence and low platelet counts according to the patient. Unfortunately her mastectomy wound dehisced on the left breast. She was taken back to the OR by [name] of general surgery on 33/24/22 and she underwent Sarah debridement and subsequent attempt at closure. She had been previously treated with Augmentin for suspected underlying infection. Once again the wound has dehisced in the mid aspect. It is roughly 1 cm deep there is tunnels both medially and laterally she. She still has sutures in place. Still describes constant pain drainage. She also has Sarah drain still in place that drains about 10 cc/day. Currently she is changing ABD pad several times Sarah day 4/12; patient went back to see [name] who remove most of the stitches and debrided her wound. She has been using silver alginate culture I did last time which was Sarah PCR culture showed Peptostreptococcus, Enterococcus faecalis and Staphylococcus epidermidis which is of questionable significance. She has Sarah larger  wound now that the sutures have been removed. There are undermining areas superior medial superior lateral and Sarah deep tunneling area in the inferior part of the wound. There is no obvious purulence. I started her on Augmentin on 04/12/2020 and I will likely extend this for another week AMAZINGLY; she is already been approved for HBO and will start today. [name] Echocardiogram which I reviewed from 2019 showed Sarah normal ejection fraction.. I went over side effects of HBO with the patient and answered any of her questions. Her husband was present 5/3; [name]. She went to see [name] about 2-1/2 weeks ago according to the patient he can remove some of the surrounding debris. She will see him again later this week or early next week. She has  been using silver alginate. Sarah lot of this is problematic since she has Medicaid which traditionally does not cover wound care supplies although it traditionally does not cover HBO either Her wound actually looks quite Sarah bit better than the last time I saw this better looking surface healthy looking tissue. She has tunnels at 810 2 in 6 the deepest of these is at 2 at 2.2 cm there is no exposed bone. 5/10; the patient was coming in for nurse visit today but requested that I see the wound because of surface discoloration. She states the Iodoflex makes the surface of this turned "white". She is not really complaining of undue tenderness. She is changing this every second day. The goal of this had been to get the surface of the wound to Sarah vibrant red color and then potentially look at changing to Sarah silver collagen-based dressing, Puraply, and then perhaps an alternative skin substituteo Derma vest. The patient tells me she has an appoint with her oncologist [name] on the 19th. She apparently think she is going to get another PET scan. Based on this I would like his comment about whether there is  possibility of any malignancy in the wound itself or just underneath it. Up to now we have not really thought that was true 5/17; nice improvement in the chest wound. Less depth. We have been using Iodoflex to clean off the wound surface this seems to have done nicely. Patient went to see [name] 2 days ago who has referred her to see Dr. Claudia Desanctis. I certainly agree with the thought the surface of the wound looks Sarah lot better now and I think consideration of plastic surgery is certainly something the patient should discuss. I have talked to the patient about this today and I certainly support Sarah conversation with Dr. Claudia Desanctis. I also wondered about is there any residual cancer in this. [name] apparently did not seem to think so per the patient although I do not see that in the text conversations we have had. I also wonder about Sarah skin substitute if her version of Medicare [[employer]P] would cover this. [name] is apparently considering starting her on [place]. Patient is already very scared about the side effects but she will discuss this with [name] on 5/19 5/31; patient's wound about the same in terms of size she still has the tunneling area to the left. Surface of the wound looks Sarah lot better there has been some filling in the granulation I have been included on serial text between Dr. Callie Fielding surgery] Dr. Ruffin Frederick oncology] and Dr. Para Skeans surgery]. I think there is not Sarah lot of thought that the patient has recurrence of her cancer her MRI was indeterminant but [name] did not feel that there was any reason to think she has Sarah major recurrence and he was not in favor of doing Sarah dedicated breast MRI. Dr. Claudia Desanctis wants to go ahead with Sarah flap closure I did it. I have not reviewed his note but per the patient [name] latissimus dorsi myocutaneous flap or I think Sarah skin flap from her abdomen. I have talked to the patient I think it is time to attempt this. If I  can continue hyperbarics surrounding the flap then we will attempt to do this 6/14; patient using Prisma as the primary dressing here. She has completed hyperbarics and did very well. Her wound is responded nicely. The depth of this and the condition of  the granulation tissue looks Sarah lot better. She has undermining areas at 2 and 10:00 both of these seem to be better as well. She is scheduled for flap closure on 7/11 by Dr. Claudia Desanctis 6/29; the patient continues to use Prisma wet-to-dry. Her graft is scheduled for 7/11 by Dr. Claudia Desanctis. We are looking at getting her additional hyperbaric treatment which I think might help with the likelihood of graft survival in this difficult irradiated area 7/21; patient returns to clinic to reinitiate HBO after her surgical procedure by Dr. Claudia Desanctis. On 07/15/2020 she had debridement of left chest wound and Sarah rotational flap closure. She also had excision of right excess axillary tissue. Pathology did not show any malignancy and follow-up Dr. Claudia Desanctis saw the patient yesterday. He noted Sarah 2 cm area of wound dehiscence. Recommended she return for hyperbaric oxygen and she returns today for her first treatment. Follow-up with him in 2 weeks. We were not actually going to see the patient today her appointment to be evaluated was Monday however she was concerned about Sarah "fishy odor". She is also noted some drainage. Not specifically dressing in the wound area but covering with Adaptic 7/25; I am seeing this wound and follow-up. So far her culture is polymicrobial not growing any specific anything specific nevertheless with the Augmentin and the silver alginate her odor is Sarah lot better. Not as much periwound tenderness. Dimensions measuring 2.7 x 3.3 x 1.6. She has undermining from 10-1 o'clock of about 1 and half centimeters. Necrotic fat on the surface of this... 08/13/2020 upon evaluation today patient appears to be doing decently well in regard to her wound all things considered.  Obviously this is much more open than what was hoped for based on the fact that she had Sarah flap performed which was supposed to completely close this wound. Nonetheless currently she tells me that she is not really having any significant pain fortunately the drainage is improved compared to last time there is still some evidence of odor as far as the drainage is concerned but is definitely not as purulent as what was previously noted. With that being said I do believe that there are quite Sarah few sutures which are quite embedded. Nonetheless I believe this needs to be removed. In fact if they are not I am afraid this is again actually grow into the skin and that is can to be even bigger issue. Some of them even so at this point appeared that there could be difficult to even get out. 8/17; this is the first time I am seeing this in about 3 weeks. The patient had stop by my office yesterday to discuss after completing hyperbaric therapy. Based on what she said I was expecting the worst today however apparently with dressing changes and perhaps debridement last week the surface of the wound actually looks better than what I was expecting. There is considerable depth although the last time I saw this the base of the wound was necrotic there is still some necrotic tissue on the surface of the wound bed however in general the rest the tissue looks healthy even with some bleeding. She has been using cleaning the wound with Dakin's and then applying Prisma and then backing Dakin's wet-to-dry. This seems to work to clean the wound surface up. They also ordered Sarah wound VAC and I discussed this with her in depth today She is not systemically unwell. She is not running Sarah fever there is no odor. She talks about pain  over the lower part of the flap to touch. This does not appear to be related to movement of her shoulder. 8/23; the patient has been using the wound VAC for 1 week. Already Sarah remarkable improvement and she  appears to be tolerating this well we had Santyl under the wound VAC however the surface looks so clean today I am doubtful that is going to be necessary She is also being treated with HBO. This is helped her dramatically. Apparently her current certification will be up on 8/31. She is only had 19 dives and we will certainly need to recertify her up for this. Since I saw her last week I put in Sarah secure text message to Dr. Claudia Desanctis through epic to let him know that we were going ahead with Sarah wound VAC. He did not have any objections to this. He is offering her more surgery however in the eventuality that this does not close Objective Constitutional Sitting or standing Blood Pressure is within target range for patient.. Pulse regular and within target range for patient.Marland Kitchen Respirations regular, non-labored and within target range.. Temperature is normal and within the target range for the patient.Marland Kitchen Appears in no distress. Vitals Time Taken: 9:36 AM, Height: 67 in, Source: Stated, Weight: 257 lbs, Source: Stated, BMI: 40.2, Temperature: 98.3 F, Pulse: 86 bpm, Respiratory Rate: 18 breaths/min, Blood Pressure: 119/82 mmHg. General Notes: Wound exam; wound on the upper part of her surgical incision with close proximity to the anterior axillary line. Considerable improvement in the surface of the wound there is still undermining from about 2-6 about 1 cm the rest of this looks incredibly clean healthy looking granulation. No evidence of Integumentary (Hair, Skin) Wound #1 status is Open. Original cause of wound was Surgical Injury. The date acquired was: 01/16/2020. The wound has been in treatment 20 weeks. The wound is located on the Left Chest. The wound measures 3.3cm length x 3cm width x 1.2cm depth; 7.775cm^2 area and 9.331cm^3 volume. There is Fat Layer (Subcutaneous Tissue) exposed. There is no tunneling noted, however, there is undermining starting at 10:00 and ending at 4:00 with Sarah maximum distance  of 0.8cm. There is Sarah medium amount of serosanguineous drainage noted. The wound margin is well defined and not attached to the wound base. There is large (67- 100%) red, pink granulation within the wound bed. There is Sarah small (1-33%) amount of necrotic tissue within the wound bed including Adherent Slough. 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 - Tuesday with Dr. Dellia Nims Nurse Visit: - Vac dressing change Friday Bathing/ Shower/ Hygiene: May shower with protection but do not get wound dressing(s) wet. - May Negative Presssure Wound Therapy: Wound Vac to wound continuously at 143mm/hg pressure Black Foam - Apply fibracol to wound base and undermiing under wound vac WOUND #1: - Chest Wound Laterality: Left Cleanser: Wound Cleanser 2 x Per Week/15 Days Discharge Instructions: Cleanse the wound with wound cleanser prior to applying Sarah clean dressing using gauze sponges, not tissue or cotton balls. Prim Dressing: FIBRACOL Plus Dressing, 2x2 in (collagen) 2 x Per Week/15 Days ary Discharge Instructions: Moisten collagen with saline or hydrogel 1. I think the Santyl can be discontinued under the eighth the wound VAC and we will use silver collagen  in the undermining area 2. Absolutely to continue the wound VAC 3. Remarkable transition in conjunction with HBO. She definitely needs to continue her HBO for the full 40 ordered treatments. We absolutely need to recertify her beyond 6/76/7209. 4. As mentioned I have communicated with Dr. Claudia Desanctis. I think she is Sarah candidate for further surgery however optimistically she will not need this. Electronic Signature(s) Signed: 08/28/2020  8:02:04 AM By: [name] MD Entered By: [name] on 08/27/2020 11:05:29 -------------------------------------------------------------------------------- SuperBill Details Patient Name: Date of Service: [name] 08/27/2020 Medical Record Number: [id number] Patient Account Number: [id number] Date of Birth/Sex: Treating RN: 08/26/1970 (50 y.o. 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

## 2020-08-28 NOTE — Progress Notes (Addendum)
CHAYSE, GRACEY ([id number]) Visit Report for 08/27/2020 Arrival Information Details Patient Name: Date of Service: Sarah [name] [name] 08/27/2020 9:00 Sarah M Medical Record Number: [id number] Patient Account Number: [id number] Date of Birth/Sex: Treating RN: 07-May-1970 (50 y.o. [name] Primary Care : [name] Other Clinician: Referring : Treating /Extender: [name] in Treatment: 67 Visit Information History Since Last Visit Added or deleted any medications: No Patient Arrived: Ambulatory Any new allergies or adverse reactions: No Arrival Time: 09:33 Had Sarah fall or experienced change in No Accompanied By: self activities of daily living that may affect Transfer Assistance: None risk of falls: Patient Identification Verified: Yes Signs or symptoms of abuse/neglect since last visito No Secondary Verification Process Completed: Yes Hospitalized since last visit: No Patient Requires Transmission-Based Precautions: No Implantable device outside of the clinic excluding No Patient Has Alerts: No cellular tissue based products placed in the center since last visit: Has Dressing in Place as Prescribed: Yes Pain Present Now: Yes Electronic Signature(s) Signed: 08/27/2020 6:01:23 PM By: Baruch Gouty RN, BSN Entered By: Baruch Gouty on 08/27/2020 09:36:38 -------------------------------------------------------------------------------- Encounter Discharge Information Details Patient Name: Date of Service: Sarah Crochet V [name] 08/27/2020 9:00 Sarah M Medical Record Number: [id number] Patient Account Number: [id number] Date of Birth/Sex: Treating RN: December 18, 1970 (50 y.o. [name] Primary Care : [name] Other Clinician: Referring : Treating /Extender: [name] in Treatment: 20 Encounter Discharge Information Items Discharge Condition:  Stable Ambulatory Status: Ambulatory Discharge Destination: Home Transportation: Private Auto Accompanied By: self Schedule Follow-up Appointment: Yes Clinical Summary of Care: Patient Declined Electronic Signature(s) Signed: 08/27/2020 6:01:23 PM By: Baruch Gouty RN, BSN Entered By: Baruch Gouty on 08/27/2020 11:47:39 -------------------------------------------------------------------------------- Lower Extremity Assessment Details Patient Name: Date of Service: Sarah Wallie Char V [name] 08/27/2020 9:00 Sarah M Medical Record Number: [id number] Patient Account Number: [id number] Date of Birth/Sex: Treating RN: 05/27/1970 (50 y.o. [name] Primary Care : [name] Other Clinician: Referring : Treating /Extender: [name] in Treatment: 20 Electronic Signature(s) Signed: 08/27/2020 6:01:23 PM By: Baruch Gouty RN, BSN Entered By: Baruch Gouty on 08/27/2020 09:38:38 -------------------------------------------------------------------------------- Multi Wound Chart Details Patient Name: Date of Service: Sarah Crochet V [name] 08/27/2020 9:00 Sarah M Medical Record Number: [id number] Patient Account Number: [id number] Date of Birth/Sex: Treating RN: 08/11/1970 (50 y.o. [name], [name] Primary Care : [name] Other Clinician: Referring : Treating /Extender: [name] in Treatment: 20 Vital Signs Height(in): 67 Pulse(bpm): 86 Weight(lbs): 257 Blood Pressure(mmHg): 119/82 Body Mass Index(BMI): 40 Temperature(F): 98.3 Respiratory Rate(breaths/min): 18 Photos: [N/Sarah:N/Sarah] Left Chest N/Sarah N/Sarah Wound Location: Surgical Injury N/Sarah N/Sarah Wounding Event: Open Surgical Wound N/Sarah N/Sarah Primary Etiology: Necrosis (Radiation) N/Sarah N/Sarah Secondary Etiology: Hypertension, Osteoarthritis, ReceivedN/Sarah N/Sarah Comorbid History: Chemotherapy, Received  Radiation 01/16/2020 N/Sarah N/Sarah Date Acquired: 20 N/Sarah N/Sarah Weeks of Treatment: Open N/Sarah N/Sarah Wound Status: 3.3x3x1.2 N/Sarah N/Sarah Measurements L x W x D (cm) 7.775 N/Sarah N/Sarah Sarah (cm) : rea 9.331 N/Sarah N/Sarah Volume (cm) : -22.20% N/Sarah N/Sarah % Reduction in Sarah rea: -33.30% N/Sarah N/Sarah % Reduction in Volume: 10 Starting Position 1 (o'clock): 4 Ending Position 1 (o'clock): 0.8 Maximum Distance 1 (cm): Yes N/Sarah N/Sarah Undermining: Full Thickness Without Exposed N/Sarah N/Sarah Classification: Support Structures Medium N/Sarah N/Sarah Exudate Amount: Serosanguineous N/Sarah N/Sarah Exudate Type: red, brown N/Sarah N/Sarah Exudate Color: Well defined, not attached  N/Sarah N/Sarah Wound Margin: Large (67-100%) N/Sarah N/Sarah Granulation Amount: Red, Pink N/Sarah N/Sarah Granulation Quality: Small (1-33%) N/Sarah N/Sarah Necrotic Amount: Fat Layer (Subcutaneous Tissue): Yes N/Sarah N/Sarah Exposed Structures: Fascia: No Tendon: No Muscle: No Joint: No Bone: No None N/Sarah N/Sarah Epithelialization: Treatment Notes Electronic Signature(s) Signed: 08/27/2020 5:31:05 PM By: Rhae Hammock RN Signed: 08/28/2020 8:02:04 AM By: [name] MD Entered By: [name] on 08/27/2020 10:59:52 -------------------------------------------------------------------------------- Multi-Disciplinary Care Plan Details Patient Name: Date of Service: Sarah Crochet V [name] 08/27/2020 9:00 Sarah M Medical Record Number: [id number] Patient Account Number: [id number] Date of Birth/Sex: Treating RN: [birth year] (50 y.o. [name], [name] Primary Care : [name] Other Clinician: Referring : Treating /Extender: [name] in Treatment: 20 Active Inactive Wound/Skin Impairment Nursing Diagnoses: Impaired tissue integrity Knowledge deficit related to smoking impact on wound healing Knowledge deficit related to ulceration/compromised skin integrity Goals: Patient will demonstrate Sarah reduced rate of smoking or cessation  of smoking Date Initiated: 04/09/2020 Date Inactivated: 06/18/2020 Target Resolution Date: 06/28/2020 Goal Status: Met Patient will have Sarah decrease in wound volume by X% from date: (specify in notes) Date Initiated: 04/09/2020 Date Inactivated: 07/03/2020 Target Resolution Date: 07/05/2020 Goal Status: Met Patient/caregiver will verbalize understanding of skin care regimen Date Initiated: 04/09/2020 Target Resolution Date: 08/30/2020 Goal Status: Active Ulcer/skin breakdown will have Sarah volume reduction of 30% by week 4 Date Initiated: 04/09/2020 Date Inactivated: 05/14/2020 Target Resolution Date: 05/11/2020 Unmet Reason: see wound Goal Status: Unmet meaurements. Interventions: Assess patient/caregiver ability to obtain necessary supplies Assess patient/caregiver ability to perform ulcer/skin care regimen upon admission and as needed Assess ulceration(s) every visit Provide education on smoking Notes: Electronic Signature(s) Signed: 08/27/2020 5:31:05 PM By: Rhae Hammock RN Entered By: Rhae Hammock on 08/27/2020 10:24:58 -------------------------------------------------------------------------------- Negative Pressure Wound Therapy Maintenance (NPWT) Details Patient Name: Date of Service: Sarah [name], Sarah V [name] 08/27/2020 9:00 Sarah M Medical Record Number: [id number] Patient Account Number: [id number] Date of Birth/Sex: Treating RN: 1970-10-30 (50 y.o. [name], [name] Primary Care : [name] Other Clinician: Referring : Treating /Extender: [name] in Treatment: 20 NPWT Maintenance Performed for: Wound #1 Left Chest Performed By: Rhae Hammock, RN Type: VAC System Coverage Size (sq cm): 9.9 Pressure Type: Constant Pressure Setting: 125 mmHG Drain Type: None Primary Contact: Non-Adherent Sponge/Dressing Type: Foam, Black Date Initiated: 08/21/2020 Dressing Removed: Yes Quantity of Sponges/Gauze Removed: 1  black foam Canister Changed: Yes Canister Exudate Volume: 50 Dressing Reapplied: Yes Quantity of Sponges/Gauze Inserted: 1 black foam Respones T Treatment: o tolerated well Days On NPWT : 7 Post Procedure Diagnosis Same as Pre-procedure Electronic Signature(s) Signed: 01/23/2021 12:40:15 PM By: Rhae Hammock RN Entered By: Rhae Hammock on 09/03/2020 15:55:59 -------------------------------------------------------------------------------- Pain Assessment Details Patient Name: Date of Service: Sarah Crochet V [name] 08/27/2020 9:00 Sarah M Medical Record Number: [id number] Patient Account Number: [id number] Date of Birth/Sex: Treating RN: Aug 31, 1970 (50 y.o. [name] Primary Care : [name] Other Clinician: Referring : Treating /Extender: [name] in Treatment: 20 Active Problems Location of Pain Severity and Description of Pain Patient Has Paino Yes Site Locations Pain Location: Pain Location: Pain in Ulcers With Dressing Change: Yes Duration of the Pain. Constant / Intermittento Intermittent Rate the pain. Current Pain Level: 3 Worst Pain Level: 3 Least Pain Level: 0 Character of Pain Describe the Pain: Aching Pain Management and Medication Current Pain Management: Medication: Yes Is the  Current Pain Management Adequate: Adequate How does your wound impact your activities of daily livingo Sleep: No Bathing: No Appetite: No Relationship With Others: No Bladder Continence: No Emotions: No Bowel Continence: No Work: No Toileting: No Drive: No Dressing: No Hobbies: No Electronic Signature(s) Signed: 08/27/2020 6:01:23 PM By: Baruch Gouty RN, BSN Entered By: Baruch Gouty on 08/27/2020 09:38:30 -------------------------------------------------------------------------------- Wound Assessment Details Patient Name: Date of Service: Sarah Crochet V [name] 08/27/2020 9:00 Sarah  M Medical Record Number: [id number] Patient Account Number: [id number] Date of Birth/Sex: Treating RN: 05/29/1970 (50 y.o. [name] Primary Care Vee Bahe: [name] Other Clinician: Referring Allee Busk: Treating Elliannah Wayment/Extender: [name] in Treatment: 20 Wound Status Wound Number: 1 Primary Open Surgical Wound Etiology: Wound Location: Left Chest Secondary Necrosis (Radiation) Wounding Event: Surgical Injury Etiology: Date Acquired: 01/16/2020 Wound Status: Open Weeks Of Treatment: 20 Comorbid Hypertension, Osteoarthritis, Received Chemotherapy, Clustered Wound: No History: Received Radiation Photos Wound Measurements Length: (cm) 3.3 Width: (cm) 3 Depth: (cm) 1.2 Area: (cm) 7.775 Volume: (cm) 9.331 % Reduction in Area: -22.2% % Reduction in Volume: -33.3% Epithelialization: None Tunneling: No Undermining: Yes Starting Position (o'clock): 10 Ending Position (o'clock): 4 Maximum Distance: (cm) 0.8 Wound Description Classification: Full Thickness Without Exposed Support Structu Wound Margin: Well defined, not attached Exudate Amount: Medium Exudate Type: Serosanguineous Exudate Color: red, brown res Foul Odor After Cleansing: No Slough/Fibrino Yes Wound Bed Granulation Amount: Large (67-100%) Exposed Structure Granulation Quality: Red, Pink Fascia Exposed: No Necrotic Amount: Small (1-33%) Fat Layer (Subcutaneous Tissue) Exposed: Yes Necrotic Quality: Adherent Slough Tendon Exposed: No Muscle Exposed: No Joint Exposed: No Bone Exposed: No Electronic Signature(s) Signed: 08/27/2020 5:51:35 PM By: Lorrin Jackson Signed: 08/27/2020 6:01:23 PM By: Baruch Gouty RN, BSN Entered By: Lorrin Jackson on 08/27/2020 09:48:48 -------------------------------------------------------------------------------- Vitals Details Patient Name: Date of Service: Sarah Crochet V [name] 08/27/2020 9:00 Sarah M Medical Record Number:  [id number] Patient Account Number: [id number] Date of Birth/Sex: Treating RN: 02-17-1970 (50 y.o. [name], [name] Primary Care Miyani Cronic: [name] Other Clinician: Referring Dylin Breeden: Treating Klaudia Beirne/Extender: [name] in Treatment: 20 Vital Signs Time Taken: 09:36 Temperature (F): 98.3 Height (in): 67 Pulse (bpm): 86 Source: Stated Respiratory Rate (breaths/min): 18 Weight (lbs): 257 Blood Pressure (mmHg): 119/82 Source: Stated Reference Range: 80 - 120 mg / dl Body Mass Index (BMI): 40.2 Electronic Signature(s) Signed: 08/27/2020 6:01:23 PM By: Baruch Gouty RN, BSN Signed: 08/27/2020 6:01:23 PM By: Baruch Gouty RN, BSN Entered By: Baruch Gouty on 08/27/2020 09:37:19

## 2020-08-29 ENCOUNTER — Encounter (HOSPITAL_BASED_OUTPATIENT_CLINIC_OR_DEPARTMENT_OTHER): Payer: Medicaid Other | Admitting: Internal Medicine

## 2020-08-29 NOTE — Progress Notes (Signed)
LACEE, SCHWANTZ ([id number]) Visit Report for 08/27/2020 HBO Details Patient Name: Date of Service: [name] [name] 08/27/2020 10:00 Sarah M Medical Record Number: [id number] Patient Account Number: [id number] Date of Birth/Sex: Treating RN: 11-19-1970 (50 y.o. 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 HBO Treatment Details Treatment Number: 19 Patient Type: Outpatient Chamber Type: Monoplace Chamber Serial #: [id number] Treatment Protocol: 2.5 ATA with 90 minutes oxygen, with two 5 minute air breaks Treatment Details Compression Rate Down: 2.0 psi / minute De-Compression Rate Up: 2.5 psi / minute Sarah breaks and ir Compress Tx Pressure breathing periods Decompress Decompress Begins Reached (leave unused Begins Ends spaces blank) Chamber Pressure (ATA 1 2.5 2.5 2.5 2.5 2.5 - - 2.5 1 ) Clock Time (24 hr) 11:00 11:12 11:42 11:47 - - - - 12:02 12:11 Treatment Length: 71 (minutes) Treatment Segments: 2 Vital Signs Capillary Blood Glucose Reference Range: 80 - 120 mg / dl HBO Diabetic Blood Glucose Intervention Range: <131 mg/dl or >249 mg/dl Time Vitals Blood Respiratory Capillary Blood Glucose Pulse Action Type: Pulse: Temperature: Taken: Pressure: Rate: Glucose (mg/dl): Meter #: Oximetry (%) Taken: Pre 09:36 119/82 86 18 98.3 Post 12:17 129/88 78 18 97.7 Treatment Response Adverse Events: 1:Nausea / Vomiting Treatment Completion Status: Treatment Aborted/Not Restarted Reason: Adverse Event Treatment Notes Patient states that she is taking chemotherapy medication and the side effect thereof is  nausea. [name] Notes Patient seen for wound care eval. c/o nausea which she thinks is secondary to chemotherapy. Only tolerated 2 segments of todays treatment Physician HBO Attestation: I certify that I supervised this HBO treatment in accordance with Medicare guidelines. Sarah trained emergency response team is readily available per Yes hospital policies and procedures. Continue HBOT as ordered. Yes Electronic Signature(s) Signed: 08/28/2020 8:02:04 AM By: [name] MD Entered By: [name] on 08/27/2020 19:33:31 -------------------------------------------------------------------------------- HBO Safety Checklist Details Patient Name: Date of Service: [name] [name] 08/27/2020 10:00 Sarah M Medical Record Number: [id number] Patient Account Number: [id number] Date of Birth/Sex: Treating RN: 1970/09/23 (50 y.o. [name], [name] Primary Care Braylinn Gulden: [name] Other Clinician: [name] Referring Rocky Rishel: Treating Special Ranes/Extender: [name] in Treatment: 20 HBO Safety Checklist Items Safety Checklist Consent Form Signed Patient voided / foley secured and emptied When did you last eato snack this AM Last dose of injectable or oral agent n/Sarah Ostomy pouch emptied and vented if applicable NA All implantable devices assessed, documented and approved NA Intravenous access site secured and place NA Valuables secured Linens and cotton and cotton/polyester blend (less than 51% polyester) Personal oil-based products / skin lotions / body lotions removed Wigs or hairpieces removed NA Smoking or tobacco materials removed NA Books / newspapers / magazines / loose paper removed Cologne, aftershave, perfume and deodorant removed Jewelry removed (may wrap wedding band) Make-up removed Hair care products removed Battery operated devices (external) removed Heating patches and chemical warmers removed Titanium eyewear  removed NA Nail polish cured greater than 10 hours Casting material cured greater than 10 hours NA Hearing aids removed NA Loose dentures or partials removed Prosthetics have been removed NA Patient demonstrates correct use of air break device (if applicable) Patient concerns have  been addressed Patient grounding bracelet on and cord attached to chamber Specifics for Inpatients (complete in addition to above) Medication sheet sent with patient NA Intravenous medications needed or due during therapy sent with patient NA Drainage tubes (e.g. nasogastric tube or chest tube secured and vented) NA Endotracheal or Tracheotomy tube secured NA Cuff deflated of air and inflated with saline NA Airway suctioned NA Electronic Signature(s) Signed: 08/29/2020 10:34:15 AM By: [name] EMT Entered By: [name] on 08/27/2020 10:55:28

## 2020-08-29 NOTE — Progress Notes (Addendum)
[name], [name] ([id number]) Visit Report for 08/27/2020 SuperBill Details Patient Name: Date of Service: [name] [name] 08/27/2020 Medical Record Number: [id number] Patient Account Number: [id number] Date of Birth/Sex: Treating RN: 1970/08/31 (50 y.o. 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 Facility Procedures CPT4 Code Description Modifier Quantity [id number] G0277-(Facility Use Only) HBOT full body chamber, 41mn , 2 ICD-10 Diagnosis Description T81.31XD Disruption of external operation (surgical) wound, not elsewhere classified, subsequent encounter 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

## 2020-08-29 NOTE — Progress Notes (Signed)
RAYLAH, CORONEL ([id number]) Visit Report for 08/27/2020 Arrival Information Details Patient Name: Date of Service: [name] 08/27/2020 10:00 A M Medical Record Number: [id number] Patient Account Number: [id number] Date of Birth/Sex: Treating RN: [birth year] (50 y.o. [name], [name] Primary Care Tonette Koehne: [name] Other Clinician: [name] Referring Dashea Mcmullan: Treating Madge Therrien/Extender: [name] in Treatment: 36 Visit Information History Since Last Visit All ordered tests and consults were completed: Yes Patient Arrived: Ambulatory Added or deleted any medications: No Arrival Time: 10:45 Any new allergies or adverse reactions: No Accompanied By: self Had a fall or experienced change in No Transfer Assistance: None activities of daily living that may affect Patient Identification Verified: Yes risk of falls: Secondary Verification Process Completed: Yes Signs or symptoms of abuse/neglect since last visito No Patient Requires Transmission-Based Precautions: No Hospitalized since last visit: No Patient Has Alerts: No Implantable device outside of the clinic excluding No cellular tissue based products placed in the center since last visit: Pain Present Now: No Electronic Signature(s) Signed: 08/29/2020 10:34:15 AM By: [name] EMT Entered By: [name] on 08/27/2020 10:50:36 -------------------------------------------------------------------------------- Encounter Discharge Information Details Patient Name: Date of Service: [name] 08/27/2020 10:00 A M Medical Record Number: [id number] Patient Account Number: [id number] Date of Birth/Sex: Treating RN: 1970/09/06 (50 y.o. [name], [name] Primary Care Lynne Righi: [name] Other Clinician: [name] Referring Sirus Labrie: Treating Devonne Lalani/Extender: [name] in Treatment: 20 Encounter  Discharge Information Items Discharge Condition: Stable Ambulatory Status: Ambulatory Discharge Destination: Home Transportation: Private Auto Accompanied By: self Schedule Follow-up Appointment: No Clinical Summary of Care: Electronic Signature(s) Signed: 08/29/2020 10:34:15 AM By: [name] EMT Entered By: [name] on 08/27/2020 12:32:09 -------------------------------------------------------------------------------- Vitals Details Patient Name: Date of Service: [name] 08/27/2020 10:00 A M Medical Record Number: [id number] Patient Account Number: [id number] Date of Birth/Sex: Treating RN: 08/23/1970 (50 y.o. [name], [name] Primary Care Kevyn Wengert: [name] Other Clinician: [name] Referring Erlean Mealor: Treating Mamie Hundertmark/Extender: [name] in Treatment: 20 Vital Signs Time Taken: 09:36 Temperature (F): 98.3 Height (in): 67 Pulse (bpm): 86 Weight (lbs): 257 Respiratory Rate (breaths/min): 18 Body Mass Index (BMI): 40.2 Blood Pressure (mmHg): 119/82 Reference Range: 80 - 120 mg / dl Electronic Signature(s) Signed: 08/29/2020 10:34:15 AM By: [name] EMT Entered By: [name] on 08/27/2020 10:51:11

## 2020-08-30 ENCOUNTER — Encounter (HOSPITAL_BASED_OUTPATIENT_CLINIC_OR_DEPARTMENT_OTHER): Payer: Medicaid Other | Admitting: Internal Medicine

## 2020-08-30 ENCOUNTER — Other Ambulatory Visit: Payer: Self-pay

## 2020-08-30 DIAGNOSIS — C50412 Malignant neoplasm of upper-outer quadrant of left female breast: Secondary | ICD-10-CM | POA: Diagnosis not present

## 2020-08-30 DIAGNOSIS — L598 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: Secondary | ICD-10-CM

## 2020-08-30 NOTE — Progress Notes (Signed)
SHASTELYN, PEMBLE ([id number]) Visit Report for 08/30/2020 SuperBill Details Patient Name: Date of Service: [name] 08/30/2020 Medical Record Number: [id number] Patient Account Number: [id number] Date of Birth/Sex: Treating RN: 06-Oct-1970 (50 y.o. 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 Facility Procedures CPT4 Code Description Modifier Quantity [id number] G0277-(Facility Use Only) HBOT full body chamber, 66mn , 4 ICD-10 Diagnosis Description T81.31XD Disruption of external operation (surgical) wound, not elsewhere classified, subsequent encounter 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 Physician Procedures Quantity CPT4 Code Description Modifier [id number] PHYS HYPERBARIC OXYGEN THERAPY 1 ICD-10 Diagnosis Description T81.31XD Disruption of external operation (surgical) wound, not elsewhere classified, subsequent encounter 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

## 2020-08-30 NOTE — Progress Notes (Signed)
Sarah, Khan ([id number]) Visit Report for 08/30/2020 HBO Details Patient Name: Date of Service: [name] 08/30/2020 10:00 A M Medical Record Number: [id number] Patient Account Number: [id number] Date of Birth/Sex: Treating RN: 10/20/1970 (50 y.o. 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 HBO Treatment Details Treatment Number: 20 Patient Type: Outpatient Chamber Type: Monoplace Chamber Serial #: [id number] Treatment Protocol: 2.5 ATA with 90 minutes oxygen, with two 5 minute air breaks Treatment Details Compression Rate Down: 2.0 psi / minute De-Compression Rate Up: 2.5 psi / minute A breaks and breathing ir Compress Tx Pressure periods Decompress Decompress Begins Reached (leave unused spaces Begins Ends blank) Chamber Pressure (ATA 1 2.5 2.5 2.5 2.5 2.5 - - 2.5 1 ) Clock Time (24 hr) 08:47 08:58 09:28 09:33 10:03 10:08 - - 10:38 10:47 Treatment Length: 120 (minutes) Treatment Segments: 4 Vital Signs Capillary Blood Glucose Reference Range: 80 - 120 mg / dl HBO Diabetic Blood Glucose Intervention Range: <131 mg/dl or >249 mg/dl Time Vitals Blood Respiratory Capillary Blood Glucose Pulse Action Type: Pulse: Temperature: Taken: Pressure: Rate: Glucose (mg/dl): Meter #: Oximetry (%) Taken: Pre 08:12 126/86 96 18 98.5 Post 10:50 124/83 74 18 97.9 Treatment Response Treatment Toleration: Well Treatment Completion Status: Treatment Completed without Adverse Event Physician HBO Attestation: I certify that I supervised this HBO treatment in accordance with Medicare guidelines. A trained  emergency response team is readily available per Yes hospital policies and procedures. Continue HBOT as ordered. Yes Electronic Signature(s) Signed: 08/30/2020 12:12:33 PM By: Kalman Shan DO Entered By: Kalman Shan on 08/30/2020 12:00:35 -------------------------------------------------------------------------------- HBO Safety Checklist Details Patient Name: Date of Service: [name] [name] 08/30/2020 10:00 A M Medical Record Number: [id number] Patient Account Number: [id number] Date of Birth/Sex: Treating RN: October 28, 1970 (50 y.o. [name] Primary Care Asma Boldon: [name] Other Clinician: [name] Referring Jamilyn Pigeon: Treating Josetta Wigal/Extender: [name] in Treatment: 20 HBO Safety Checklist Items Safety Checklist Consent Form Signed Patient voided / foley secured and emptied When did you last eato snack this AM Last dose of injectable or oral agent n/a Ostomy pouch emptied and vented if applicable NA All implantable devices assessed, documented and approved NA Intravenous access site secured and place NA Valuables secured Linens and cotton and cotton/polyester blend (less than 51% polyester) Personal oil-based products / skin lotions / body lotions removed Wigs or hairpieces removed NA Smoking or tobacco materials removed NA Books / newspapers / magazines / loose paper removed Cologne, aftershave, perfume and deodorant removed Jewelry removed (may wrap wedding band) NA Make-up removed Hair care products removed Battery operated devices (external) removed Heating patches and chemical warmers removed Titanium eyewear removed NA Nail polish cured greater than 10 hours Casting material cured greater than 10 hours NA Hearing aids removed NA Loose dentures or partials removed Prosthetics have been removed NA Patient demonstrates correct use of air break device (if applicable) Patient concerns  have been addressed Patient grounding bracelet on and cord attached to chamber Specifics for Inpatients (complete in addition to above) Medication sheet sent with patient NA Intravenous medications needed or due during therapy sent with patient NA Drainage tubes (e.g. nasogastric tube or chest  tube secured and vented) NA Endotracheal or Tracheotomy tube secured NA Cuff deflated of air and inflated with saline NA Airway suctioned NA Electronic Signature(s) Signed: 08/30/2020 12:03:59 PM By: [name] EMT Entered By: [name] on 08/30/2020 08:43:37

## 2020-08-30 NOTE — Progress Notes (Signed)
[name], HEBB ([id number]) Visit Report for 08/30/2020 Arrival Information Details Patient Name: Date of Service: [name] 08/30/2020 10:00 A M Medical Record Number: [id number] Patient Account Number: [id number] Date of Birth/Sex: Treating RN: May 16, 1970 (50 y.o. [name], [name] Primary Care Wynne Jury: [name] Other Clinician: [name] Referring Sumit Branham: Treating Onesty Clair/Extender: [name] in Treatment: 50 Visit Information History Since Last Visit All ordered tests and consults were completed: Yes Patient Arrived: Ambulatory Added or deleted any medications: No Arrival Time: 08:30 Any new allergies or adverse reactions: No Accompanied By: self Had a fall or experienced change in No Transfer Assistance: None activities of daily living that may affect Patient Identification Verified: Yes risk of falls: Secondary Verification Process Completed: Yes Signs or symptoms of abuse/neglect since last visito No Patient Requires Transmission-Based Precautions: No Hospitalized since last visit: No Patient Has Alerts: No Implantable device outside of the clinic excluding No cellular tissue based products placed in the center since last visit: Pain Present Now: No Electronic Signature(s) Signed: 08/30/2020 12:03:59 PM By: [name] EMT Entered By: [name] on 08/30/2020 08:40:55 -------------------------------------------------------------------------------- Encounter Discharge Information Details Patient Name: Date of Service: [name] V [name] 08/30/2020 10:00 A M Medical Record Number: [id number] Patient Account Number: [id number] Date of Birth/Sex: Treating RN: 1970-09-26 (50 y.o. [name] Primary Care Mrk Buzby: [name] Other Clinician: [name] Referring Zawadi Aplin: Treating Krishang Reading/Extender: [name] in Treatment: 20 Encounter  Discharge Information Items Discharge Condition: Stable Ambulatory Status: Ambulatory Discharge Destination: Home Transportation: Private Auto Accompanied By: self Schedule Follow-up Appointment: No Clinical Summary of Care: Electronic Signature(s) Signed: 08/30/2020 12:03:59 PM By: [name] EMT Entered By: [name] on 08/30/2020 11:07:30 -------------------------------------------------------------------------------- Vitals Details Patient Name: Date of Service: [name] V [name] 08/30/2020 10:00 A M Medical Record Number: [id number] Patient Account Number: [id number] Date of Birth/Sex: Treating RN: Apr 18, 1970 (50 y.o. [name] Primary Care Arden Axon: [name] Other Clinician: [name] Referring Keshawna Dix: Treating Psalm Schappell/Extender: [name] in Treatment: 20 Vital Signs Time Taken: 08:12 Temperature (F): 98.5 Height (in): 67 Pulse (bpm): 96 Weight (lbs): 257 Respiratory Rate (breaths/min): 18 Body Mass Index (BMI): 40.2 Blood Pressure (mmHg): 126/86 Reference Range: 80 - 120 mg / dl Electronic Signature(s) Signed: 08/30/2020 12:03:59 PM By: [name] EMT Entered By: [name] on 08/30/2020 08:41:28

## 2020-09-02 ENCOUNTER — Encounter (HOSPITAL_BASED_OUTPATIENT_CLINIC_OR_DEPARTMENT_OTHER): Payer: Medicaid Other | Admitting: Internal Medicine

## 2020-09-02 ENCOUNTER — Other Ambulatory Visit: Payer: Self-pay

## 2020-09-02 DIAGNOSIS — L598 Other specified disorders of the skin and subcutaneous tissue related to radiation: Secondary | ICD-10-CM

## 2020-09-02 DIAGNOSIS — C50412 Malignant neoplasm of upper-outer quadrant of left female breast: Secondary | ICD-10-CM | POA: Diagnosis not present

## 2020-09-03 ENCOUNTER — Encounter (HOSPITAL_BASED_OUTPATIENT_CLINIC_OR_DEPARTMENT_OTHER): Payer: Medicaid Other | Admitting: Internal Medicine

## 2020-09-03 DIAGNOSIS — C50412 Malignant neoplasm of upper-outer quadrant of left female breast: Secondary | ICD-10-CM | POA: Diagnosis not present

## 2020-09-03 NOTE — Progress Notes (Signed)
Sarah, Khan ([id number]) Visit Report for 09/02/2020 HBO Details Patient Name: Date of Service: [name] 09/02/2020 10:00 A M Medical Record Number: [id number] Patient Account Number: [id number] Date of Birth/Sex: Treating RN: 01/02/1971 (50 y.o. 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 HBO Treatment Details Treatment Number: 21 Patient Type: Outpatient Chamber Type: Monoplace Chamber Serial #: [id number] Treatment Protocol: 2.5 ATA with 90 minutes oxygen, with two 5 minute air breaks Treatment Details Compression Rate Down: 2.0 psi / minute De-Compression Rate Up: 2.5 psi / minute A breaks and breathing ir Compress Tx Pressure periods Decompress Decompress Begins Reached (leave unused spaces Begins Ends blank) Chamber Pressure (ATA 1 2.5 2.5 2.5 2.5 2.5 - - 2.5 1 ) Clock Time (24 hr) 10:12 10:24 10:54 10:59 11:29 11:34 - - 12:04 12:13 Treatment Length: 121 (minutes) Treatment Segments: 4 Vital Signs Capillary Blood Glucose Reference Range: 80 - 120 mg / dl HBO Diabetic Blood Glucose Intervention Range: <131 mg/dl or >249 mg/dl Time Vitals Blood Respiratory Capillary Blood Glucose Pulse Action Type: Pulse: Temperature: Taken: Pressure: Rate: Glucose (mg/dl): Meter #: Oximetry (%) Taken: Pre 10:03 110/87 99 20 98.2 Post 12:16 108/64 75 18 97.8 Treatment Response Treatment Toleration: Well Treatment Completion Status: Treatment Completed without Adverse Event Physician HBO Attestation: I certify that I supervised this HBO treatment in accordance with Medicare guidelines. A trained  emergency response team is readily available per Yes hospital policies and procedures. Continue HBOT as ordered. Yes Electronic Signature(s) Signed: 09/02/2020 4:52:04 PM By: Kalman Shan DO Entered By: Kalman Shan on 09/02/2020 16:50:44 -------------------------------------------------------------------------------- HBO Safety Checklist Details Patient Name: Date of Service: [name] [name] 09/02/2020 10:00 A M Medical Record Number: [id number] Patient Account Number: [id number] Date of Birth/Sex: Treating RN: 1970-03-05 (50 y.o. [name] Primary Care Ishaq Maffei: [name] Other Clinician: [name] Referring Caidin Heidenreich: Treating Deanna Wiater/Extender: [name] in Treatment: 20 HBO Safety Checklist Items Safety Checklist Consent Form Signed Patient voided / foley secured and emptied When did you last eato snack this AM Last dose of injectable or oral agent n/a Ostomy pouch emptied and vented if applicable NA All implantable devices assessed, documented and approved NA Intravenous access site secured and place NA Valuables secured Linens and cotton and cotton/polyester blend (less than 51% polyester) Personal oil-based products / skin lotions / body lotions removed Wigs or hairpieces removed NA Smoking or tobacco materials removed NA Books / newspapers / magazines / loose paper removed Cologne, aftershave, perfume and deodorant removed Jewelry removed (may wrap wedding band) Make-up removed Hair care products removed Battery operated devices (external) removed Heating patches and chemical warmers removed Titanium eyewear removed NA Nail polish cured greater than 10 hours Casting material cured greater than 10 hours NA Hearing aids removed NA Loose dentures or partials removed Prosthetics have been removed NA Patient demonstrates correct use of air break device (if applicable) Patient concerns have  been addressed Patient grounding bracelet on and cord attached to chamber Specifics for Inpatients (complete in addition to above) Medication sheet sent with patient NA Intravenous medications needed or due during therapy sent with patient NA Drainage tubes (e.g. nasogastric tube or chest tube  secured and vented) NA Endotracheal or Tracheotomy tube secured NA Cuff deflated of air and inflated with saline NA Airway suctioned NA Electronic Signature(s) Signed: 09/03/2020 12:46:31 PM By: [name] EMT Entered By: [name] on 09/02/2020 10:08:47

## 2020-09-03 NOTE — Progress Notes (Signed)
DEBE, COUTO ([id number]) Visit Report for 09/02/2020 SuperBill Details Patient Name: Date of Service: [name] 09/02/2020 Medical Record Number: [id number] Patient Account Number: [id number] Date of Birth/Sex: Treating RN: Dec 06, 1970 (50 y.o. 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 Facility Procedures CPT4 Code Description Modifier Quantity [id number] G0277-(Facility Use Only) HBOT full body chamber, 7mn , 4 ICD-10 Diagnosis Description T81.31XD Disruption of external operation (surgical) wound, not elsewhere classified, subsequent encounter 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 Physician Procedures Quantity CPT4 Code Description Modifier [id number] PHYS HYPERBARIC OXYGEN THERAPY 1 ICD-10 Diagnosis Description T81.31XD Disruption of external operation (surgical) wound, not elsewhere classified, subsequent encounter 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

## 2020-09-03 NOTE — Progress Notes (Signed)
[name], [name] ([id number]) Visit Report for 09/03/2020 Arrival Information Details Patient Name: Date of Service: [name] [name] 09/03/2020 10:00 Sarah M Medical Record Number: [id number] Patient Account Number: [id number] Date of Birth/Sex: Treating RN: 12/20/1970 (50 y.o. [name], [name] Primary Care Khaleelah Yowell: [name] Other Clinician: Referring Amorita Vanrossum: Treating Jaylen Knope/Extender: [name] in Treatment: 21 Visit Information History Since Last Visit All ordered tests and consults were completed: Yes Patient Arrived: Ambulatory Added or deleted any medications: No Arrival Time: 10:15 Any new allergies or adverse reactions: No Accompanied By: self Had Sarah fall or experienced change in No Transfer Assistance: None activities of daily living that may affect Patient Identification Verified: Yes risk of falls: Secondary Verification Process Completed: Yes Signs or symptoms of abuse/neglect since last visito No Patient Requires Transmission-Based Precautions: No Hospitalized since last visit: No Patient Has Alerts: No Implantable device outside of the clinic excluding No cellular tissue based products placed in the center since last visit: Pain Present Now: No Electronic Signature(s) Signed: 09/03/2020 1:07:07 PM By: [name] EMT Entered By: [name] on 09/03/2020 11:15:58 -------------------------------------------------------------------------------- Vitals Details Patient Name: Date of Service: [name] [name] 09/03/2020 10:00 Sarah M Medical Record Number: [id number] Patient Account Number: [id number] Date of Birth/Sex: Treating RN: 02/23/1970 (50 y.o. [name], [name] Primary Care Kahlie Deutscher: [name] Other Clinician: Referring Lucciana Head: Treating Yola Paradiso/Extender: [name] in Treatment: 21 Vital Signs Time Taken: 09:26 Temperature (F): 98.3 Height (in):  67 Pulse (bpm): 84 Weight (lbs): 257 Respiratory Rate (breaths/min): 20 Body Mass Index (BMI): 40.2 Blood Pressure (mmHg): 101/61 Reference Range: 80 - 120 mg / dl Electronic Signature(s) Signed: 09/03/2020 1:07:07 PM By: [name] EMT Entered By: [name] on 09/03/2020 11:16:32

## 2020-09-03 NOTE — Progress Notes (Signed)
[name], [name] ([id number]) Visit Report for 09/02/2020 Arrival Information Details Patient Name: Date of Service: [name] Sarah D. 09/02/2020 10:00 Sarah M Medical Record Number: [id number] Patient Account Number: [id number] Date of Birth/Sex: Treating RN: [birth year] (50 y.o. [name] Primary Care Xaiver Roskelley: [name] Other Clinician: [name] Referring Konnor Vondrasek: Treating Attie Nawabi/Extender: [name] in Treatment: 93 Visit Information History Since Last Visit All ordered tests and consults were completed: Yes Patient Arrived: Ambulatory Added or deleted any medications: No Arrival Time: 09:50 Any new allergies or adverse reactions: No Accompanied By: self Had Sarah fall or experienced change in No Transfer Assistance: None activities of daily living that may affect Patient Identification Verified: Yes risk of falls: Secondary Verification Process Completed: Yes Signs or symptoms of abuse/neglect since last visito No Patient Requires Transmission-Based Precautions: No Hospitalized since last visit: No Patient Has Alerts: No Implantable device outside of the clinic excluding No cellular tissue based products placed in the center since last visit: Pain Present Now: No Electronic Signature(s) Signed: 09/03/2020 12:46:31 PM By: [name] EMT Entered By: [name] on 09/02/2020 10:02:00 -------------------------------------------------------------------------------- Encounter Discharge Information Details Patient Name: Date of Service: Sarah Crochet V Sarah D. 09/02/2020 10:00 Sarah M Medical Record Number: [id number] Patient Account Number: [id number] Date of Birth/Sex: Treating RN: Jun 17, 1970 (50 y.o. [name] Primary Care Aiyanna Awtrey: [name] Other Clinician: [name] Referring Kia Stavros: Treating Marque Rademaker/Extender: [name] in Treatment: 20 Encounter  Discharge Information Items Discharge Condition: Stable Ambulatory Status: Ambulatory Discharge Destination: Home Transportation: Private Auto Accompanied By: self Schedule Follow-up Appointment: No Clinical Summary of Care: Electronic Signature(s) Signed: 09/03/2020 12:46:31 PM By: [name] EMT Entered By: [name] on 09/02/2020 12:44:55 -------------------------------------------------------------------------------- Vitals Details Patient Name: Date of Service: Sarah Crochet V Sarah D. 09/02/2020 10:00 Sarah M Medical Record Number: [id number] Patient Account Number: [id number] Date of Birth/Sex: Treating RN: 1970-12-06 (50 y.o. [name] Primary Care Bradlee Heitman: [name] Other Clinician: [name] Referring Redith Drach: Treating Charie Pinkus/Extender: [name] in Treatment: 20 Vital Signs Time Taken: 10:03 Temperature (F): 98.2 Height (in): 67 Pulse (bpm): 99 Weight (lbs): 257 Respiratory Rate (breaths/min): 20 Body Mass Index (BMI): 40.2 Blood Pressure (mmHg): 110/87 Reference Range: 80 - 120 mg / dl Electronic Signature(s) Signed: 09/03/2020 12:46:31 PM By: [name] EMT Entered By: [name] on 09/02/2020 10:04:38

## 2020-09-04 ENCOUNTER — Other Ambulatory Visit: Payer: Self-pay

## 2020-09-04 ENCOUNTER — Encounter (HOSPITAL_BASED_OUTPATIENT_CLINIC_OR_DEPARTMENT_OTHER): Payer: Medicaid Other | Admitting: Physician Assistant

## 2020-09-04 DIAGNOSIS — C50412 Malignant neoplasm of upper-outer quadrant of left female breast: Secondary | ICD-10-CM | POA: Diagnosis not present

## 2020-09-04 NOTE — Progress Notes (Signed)
[name], [name] ([id number]) Visit Report for 09/03/2020 HPI Details Patient Name: Date of Service: [name] 09/03/2020 9:15 A M Medical Record Number: [id number] Patient Account Number: [id number] Date of Birth/Sex: Treating RN: [birth year] (50 y.o. [name], [name] Primary Care Provider: [name] Other Clinician: Referring Provider: Treating Provider/Extender: [name] in Treatment: 21 History of Present Illness HPI Description: ADMISSION 04/09/2020 This is a 50 year old woman who is history of breast cancer in the left breast goes back to 2018. He was found to have a mass in her left breast. She underwent a left back lumpectomy and then subsequent chemo that she did not tolerate very well. She had seed implants placed but then underwent external beam a radiation in the spring 2019. 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 Her adjunctive radiation was from 03/11/2017 through 04/21/2017. Unfortunately she had a left breast biopsy on 11/24/2019 and it was again positive for an invasive ductal carcinoma grade 3 he is to resume at receptor 80% positive. HER-2 negative progesterone negative. She opted for bilateral mastectomies which were done on 01/09/2020 on the right with no evidence of malignancy on the left stage IIb invasive ductal carcinoma. She again received chemotherapy but she did not tolerate it because of wound dehiscence and low platelet counts according to the patient. Unfortunately her mastectomy wound dehisced on the left breast. She was taken back to the OR by [name] of general surgery on 33/24/22 and she underwent a debridement and subsequent attempt at closure. She had been previously treated with Augmentin for suspected underlying infection. Once again the wound has dehisced in the mid  aspect. It is roughly 1 cm deep there is tunnels both medially and laterally she. She still has sutures in place. Still describes constant pain drainage. She also has a drain still in place that drains about 10 cc/day. Currently she is changing ABD pad several times a day 4/12; patient went back to see [name] who remove most of the stitches and debrided her wound. She has been using silver alginate culture I did last time which was a PCR culture showed Peptostreptococcus, Enterococcus faecalis and Staphylococcus epidermidis which is of questionable significance. She has a larger wound now that the sutures have been removed. There are undermining areas superior medial superior lateral and a deep tunneling area in the inferior part of the wound. There is no obvious purulence. I started her on Augmentin on 04/12/2020 and I will likely extend this for another week AMAZINGLY; she is already been approved for HBO and will start today. [name] Echocardiogram which I reviewed from 2019 showed a normal ejection fraction.. I went over side effects of HBO with the patient and answered any of her questions. Her husband was present 5/3; its been a while since have seen this wound. She went to see [name] about 2-1/2 weeks ago according to the patient he can remove some of the surrounding debris. She will see him again later this week or early next week. She has been using silver alginate. A lot of this is problematic since she has Medicaid which traditionally does not cover wound care supplies although it traditionally does not cover HBO either Her wound actually looks quite a bit better than the last time I saw this better looking surface healthy looking  tissue. She has tunnels at 810 2 in 6 the deepest of these is at 2 at 2.2 cm there is no exposed bone. 5/10; the patient was coming in for nurse visit today but requested that I see the wound because of surface discoloration. She  states the Iodoflex makes the surface of this turned "white". She is not really complaining of undue tenderness. She is changing this every second day. The goal of this had been to get the surface of the wound to a vibrant red color and then potentially look at changing to a silver collagen-based dressing, Puraply, and then perhaps an alternative skin substituteo Derma vest. The patient tells me she has an appoint with her oncologist [name] on the 19th. She apparently think she is going to get another PET scan. Based on this I would like his comment about whether there is possibility of any malignancy in the wound itself or just underneath it. Up to now we have not really thought that was true 5/17; nice improvement in the chest wound. Less depth. We have been using Iodoflex to clean off the wound surface this seems to have done nicely. Patient went to see [name] 2 days ago who has referred her to see Dr. Claudia Desanctis. I certainly agree with the thought the surface of the wound looks a lot better now and I think consideration of plastic surgery is certainly something the patient should discuss. I have talked to the patient about this today and I certainly support a conversation with Dr. Claudia Desanctis. I also wondered about is there any residual cancer in this. [name] apparently did not seem to think so per the patient although I do not see that in the text conversations we have had. I also wonder about a skin substitute if her version of Medicare [[employer]] would cover this. [name] is apparently considering starting her on [place]. Patient is already very scared about the side effects but she will discuss this with [name] on 5/19 5/31; patient's wound about the same in terms of size she still has the tunneling area to the left. Surface of the wound looks a lot better there has been some filling in the granulation I have been included on serial text between Dr. Callie Fielding surgery]  Dr. Ruffin Frederick oncology] and Dr. Para Skeans surgery]. I think there is not a lot of thought that the patient has recurrence of her cancer her MRI was indeterminant but [name] did not feel that there was any reason to think she has a major recurrence and he was not in favor of doing a dedicated breast MRI. Dr. Claudia Desanctis wants to go ahead with a flap closure I did it. I have not reviewed his note but per the patient this is a latissimus dorsi myocutaneous flap or I think a skin flap from her abdomen. I have talked to the patient I think it is time to attempt this. If I can continue hyperbarics surrounding the flap then we will attempt to do this 6/14; patient using Prisma as the primary dressing here. She has completed hyperbarics and did very well. Her wound is responded nicely. The depth of this and the condition of the granulation tissue looks a lot better. She has undermining areas at 2 and 10:00 both of these seem to be better as well. She is scheduled for flap closure on 7/11 by Dr. Claudia Desanctis 6/29; the patient continues to use Prisma wet-to-dry. Her graft is scheduled for 7/11 by  Dr. Claudia Desanctis. We are looking at getting her additional hyperbaric treatment which I think might help with the likelihood of graft survival in this difficult irradiated area 7/21; patient returns to clinic to reinitiate HBO after her surgical procedure by Dr. Claudia Desanctis. On 07/15/2020 she had debridement of left chest wound and a rotational flap closure. She also had excision of right excess axillary tissue. Pathology did not show any malignancy and follow-up Dr. Claudia Desanctis saw the patient yesterday. He noted a 2 cm area of wound dehiscence. Recommended she return for hyperbaric oxygen and she returns today for her first treatment. Follow-up with him in 2 weeks. We were not actually going to see the patient today her appointment to be evaluated was Monday however she was concerned about a "fishy odor". She is also noted some  drainage. Not specifically dressing in the wound area but covering with Adaptic 7/25; I am seeing this wound and follow-up. So far her culture is polymicrobial not growing any specific anything specific nevertheless with the Augmentin and the silver alginate her odor is a lot better. Not as much periwound tenderness. Dimensions measuring 2.7 x 3.3 x 1.6. She has undermining from 10-1 o'clock of about 1 and half centimeters. Necrotic fat on the surface of this... 08/13/2020 upon evaluation today patient appears to be doing decently well in regard to her wound all things considered. Obviously this is much more open than what was hoped for based on the fact that she had a flap performed which was supposed to completely close this wound. Nonetheless currently she tells me that she is not really having any significant pain fortunately the drainage is improved compared to last time there is still some evidence of odor as far as the drainage is concerned but is definitely not as purulent as what was previously noted. With that being said I do believe that there are quite a few sutures which are quite embedded. Nonetheless I believe this needs to be removed. In fact if they are not I am afraid this is again actually grow into the skin and that is can to be even bigger issue. Some of them even so at this point appeared that there could be difficult to even get out. 8/17; this is the first time I am seeing this in about 3 weeks. The patient had stop by my office yesterday to discuss after completing hyperbaric therapy. Based on what she said I was expecting the worst today however apparently with dressing changes and perhaps debridement last week the surface of the wound actually looks better than what I was expecting. There is considerable depth although the last time I saw this the base of the wound was necrotic there is still some necrotic tissue on the surface of the wound bed however in general the rest the  tissue looks healthy even with some bleeding. She has been using cleaning the wound with Dakin's and then applying Prisma and then backing Dakin's wet-to-dry. This seems to work to clean the wound surface up. They also ordered a wound VAC and I discussed this with her in depth today She is not systemically unwell. She is not running a fever there is no odor. She talks about pain over the lower part of the flap to touch. This does not appear to be related to movement of her shoulder. 8/23; the patient has been using the wound VAC for 1 week. Already a remarkable improvement and she appears to be tolerating this well we had Santyl under  the wound VAC however the surface looks so clean today I am doubtful that is going to be necessary She is also being treated with HBO. This is helped her dramatically. Apparently her current certification will be up on 8/31. She is only had 19 dives and we will certainly need to recertify her up for this. Since I saw her last week I put in a secure text message to Dr. Claudia Desanctis through epic to let him know that we were going ahead with a wound VAC. He did not have any objections to this. He is offering her more surgery however in the eventuality that this does not close 8/30; the patient missed 2 treatments of HBO last week because of nausea. I think she is now on Compazine which seems to have helped. Otherwise she is tolerating HBO well. We are collagen under wound VAC. Direct depth of wound measured at 1.2 cm which I think is probably unchanged however the undermining area is down to 0.5 which I think is an improvement Electronic Signature(s) Signed: 09/04/2020 7:53:49 AM By: [name] MD Entered By: [name] on 09/03/2020 10:03:48 -------------------------------------------------------------------------------- Physical Exam Details Patient Name: Date of Service: [name] V [name] 09/03/2020 9:15 A M Medical Record Number: [id number] Patient Account  Number: [id number] Date of Birth/Sex: Treating RN: 25-Aug-1970 (50 y.o. [name], [name] Primary Care Provider: [name] Other Clinician: Referring Provider: Treating Provider/Extender: [name] in Treatment: 21 Constitutional Sitting or standing Blood Pressure is within target range for patient.. Pulse regular and within target range for patient.Marland Kitchen Respirations regular, non-labored and within target range.. Temperature is normal and within the target range for the patient.Marland Kitchen Appears in no distress. Notes Wound exam; on the upper part of her surgical lesion close proximity to the left anterior axillary line. The direct depth of this looks the same measured by her staff at 1.2 cm. The tissue looks fairly healthy. The undermining area at 0.5 cm on the lateral part of the wound looks actually quite good and I think is improved Electronic Signature(s) Signed: 09/04/2020 7:53:49 AM By: [name] MD Entered By: [name] on 09/03/2020 10:05:30 -------------------------------------------------------------------------------- Physician Orders Details Patient Name: Date of Service: [name] [name] 09/03/2020 9:15 A M Medical Record Number: [id number] Patient Account Number: [id number] Date of Birth/Sex: Treating RN: May 20, 1970 (50 y.o. [name], [name] Primary Care Provider: [name] Other Clinician: Referring Provider: Treating Provider/Extender: [name] in Treatment: 21 Verbal / Phone Orders: No Diagnosis Coding Follow-up Appointments ppointment in 1 week. - Tuesday with Dr. Dellia Nims Return A Nurse Visit: - Vac dressing change Friday Bathing/ Shower/ Hygiene May shower with protection but do not get wound dressing(s) wet. - May Negative Presssure Wound Therapy Wound Vac to wound continuously at 132m/hg pressure Black Foam - Apply fibracol to wound base and undermiing under wound vac Wound  Treatment Wound #1 - Chest Wound Laterality: Left Cleanser: Wound Cleanser 2 x Per Week/15 Days Discharge Instructions: Cleanse the wound with wound cleanser prior to applying a clean dressing using gauze sponges, not tissue or cotton balls. Prim Dressing: FIBRACOL Plus Dressing, 2x2 in (collagen) 2 x Per Week/15 Days ary Discharge Instructions: Moisten collagen with saline or hydrogel Electronic Signature(s) Signed: 09/03/2020 5:24:19 PM By: BRhae HammockRN Signed: 09/04/2020 7:53:49 AM By: RLinton HamMD Entered By: BRhae Hammockon 09/03/2020 09:39:15 -------------------------------------------------------------------------------- Problem List Details Patient Name: Date of Service: [name],  [name] 09/03/2020 9:15 A M Medical Record Number: [id number] Patient Account Number: [id number] Date of Birth/Sex: Treating RN: 04/03/1970 (50 y.o. 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 09/03/2020 9:15 A M Medical Record Number: [id number] Patient Account Number:  [id number] Date of Birth/Sex: Treating RN: 24-Nov-1970 (50 y.o. [name], [name] Primary Care Provider: [name] Other Clinician: Referring Provider: Treating Provider/Extender: [name] in Treatment: 21 Subjective History of Present Illness (HPI) ADMISSION 04/09/2020 This is a 50 year old woman who is history of breast cancer in the left breast goes back to 2018. He was found to have a mass in her left breast. She underwent a left back lumpectomy and then subsequent chemo that she did not tolerate very well. She had seed implants placed but then underwent external beam a radiation in the spring 2019. 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 Her adjunctive radiation was from 03/11/2017 through 04/21/2017. Unfortunately she had a left breast biopsy on 11/24/2019 and it was again positive for an invasive ductal carcinoma grade 3 he is to resume at receptor 80% positive. HER-2 negative progesterone negative. She opted for bilateral mastectomies which were done on 01/09/2020 on the right with no evidence of malignancy on the left stage IIb invasive ductal carcinoma. She again received chemotherapy but she did not tolerate it because of wound dehiscence and low platelet counts according to the patient. Unfortunately her mastectomy wound dehisced on the left breast. She was taken back to the OR by [name] of general surgery on 33/24/22 and she underwent a debridement and subsequent attempt at closure. She had been previously treated with Augmentin for suspected underlying infection. Once again the wound has dehisced in the mid aspect. It is roughly 1 cm deep there is tunnels both medially and laterally she. She still has sutures in place. Still describes constant pain drainage. She also has a drain still in place that drains  about 10 cc/day. Currently she is changing ABD pad several times a day 4/12; patient went back to see [name] who remove most of the stitches and debrided her wound. She has been using silver alginate culture I did last time which was a PCR culture showed Peptostreptococcus, Enterococcus faecalis and Staphylococcus epidermidis which is of questionable significance. She has a larger wound now that the sutures have been removed. There are undermining areas superior medial superior lateral and a deep tunneling area in the inferior part of the wound. There is no obvious purulence. I started her on Augmentin on 04/12/2020 and I will likely extend this for another week AMAZINGLY; she is already been approved for HBO and will start today. [name] Echocardiogram which  I reviewed from 2019 showed a normal ejection fraction.. I went over side effects of HBO with the patient and answered any of her questions. Her husband was present 5/3; its been a while since have seen this wound. She went to see [name] about 2-1/2 weeks ago according to the patient he can remove some of the surrounding debris. She will see him again later this week or early next week. She has been using silver alginate. A lot of this is problematic since she has Medicaid which traditionally does not cover wound care supplies although it traditionally does not cover HBO either Her wound actually looks quite a bit better than the last time I saw this better looking surface healthy looking tissue. She has tunnels at 810 2 in 6 the deepest of these is at 2 at 2.2 cm there is no exposed bone. 5/10; the patient was coming in for nurse visit today but requested that I see the wound because of surface discoloration. She states the Iodoflex makes the surface of this turned "white". She is not really complaining of undue tenderness. She is changing this every second day. The goal of this had been to get the surface of the  wound to a vibrant red color and then potentially look at changing to a silver collagen-based dressing, Puraply, and then perhaps an alternative skin substituteo Derma vest. The patient tells me she has an appoint with her oncologist [name] on the 19th. She apparently think she is going to get another PET scan. Based on this I would like his comment about whether there is possibility of any malignancy in the wound itself or just underneath it. Up to now we have not really thought that was true 5/17; nice improvement in the chest wound. Less depth. We have been using Iodoflex to clean off the wound surface this seems to have done nicely. Patient went to see [name] 2 days ago who has referred her to see Dr. Claudia Desanctis. I certainly agree with the thought the surface of the wound looks a lot better now and I think consideration of plastic surgery is certainly something the patient should discuss. I have talked to the patient about this today and I certainly support a conversation with Dr. Claudia Desanctis. I also wondered about is there any residual cancer in this. [name] apparently did not seem to think so per the patient although I do not see that in the text conversations we have had. I also wonder about a skin substitute if her version of Medicare [[employer]] would cover this. [name] is apparently considering starting her on [place]. Patient is already very scared about the side effects but she will discuss this with [name] on 5/19 5/31; patient's wound about the same in terms of size she still has the tunneling area to the left. Surface of the wound looks a lot better there has been some filling in the granulation I have been included on serial text between Dr. Callie Fielding surgery] Dr. Ruffin Frederick oncology] and Dr. Para Skeans surgery]. I think there is not a lot of thought that the patient has recurrence of her cancer her MRI was indeterminant but [name] did not feel  that there was any reason to think she has a major recurrence and he was not in favor of doing a dedicated breast MRI. Dr. Claudia Desanctis wants to go ahead with a flap closure I did it. I have not reviewed his note but per the patient  this is a latissimus dorsi myocutaneous flap or I think a skin flap from her abdomen. I have talked to the patient I think it is time to attempt this. If I can continue hyperbarics surrounding the flap then we will attempt to do this 6/14; patient using Prisma as the primary dressing here. She has completed hyperbarics and did very well. Her wound is responded nicely. The depth of this and the condition of the granulation tissue looks a lot better. She has undermining areas at 2 and 10:00 both of these seem to be better as well. She is scheduled for flap closure on 7/11 by Dr. Claudia Desanctis 6/29; the patient continues to use Prisma wet-to-dry. Her graft is scheduled for 7/11 by Dr. Claudia Desanctis. We are looking at getting her additional hyperbaric treatment which I think might help with the likelihood of graft survival in this difficult irradiated area 7/21; patient returns to clinic to reinitiate HBO after her surgical procedure by Dr. Claudia Desanctis. On 07/15/2020 she had debridement of left chest wound and a rotational flap closure. She also had excision of right excess axillary tissue. Pathology did not show any malignancy and follow-up Dr. Claudia Desanctis saw the patient yesterday. He noted a 2 cm area of wound dehiscence. Recommended she return for hyperbaric oxygen and she returns today for her first treatment. Follow-up with him in 2 weeks. We were not actually going to see the patient today her appointment to be evaluated was Monday however she was concerned about a "fishy odor". She is also noted some drainage. Not specifically dressing in the wound area but covering with Adaptic 7/25; I am seeing this wound and follow-up. So far her culture is polymicrobial not growing any specific anything specific  nevertheless with the Augmentin and the silver alginate her odor is a lot better. Not as much periwound tenderness. Dimensions measuring 2.7 x 3.3 x 1.6. She has undermining from 10-1 o'clock of about 1 and half centimeters. Necrotic fat on the surface of this... 08/13/2020 upon evaluation today patient appears to be doing decently well in regard to her wound all things considered. Obviously this is much more open than what was hoped for based on the fact that she had a flap performed which was supposed to completely close this wound. Nonetheless currently she tells me that she is not really having any significant pain fortunately the drainage is improved compared to last time there is still some evidence of odor as far as the drainage is concerned but is definitely not as purulent as what was previously noted. With that being said I do believe that there are quite a few sutures which are quite embedded. Nonetheless I believe this needs to be removed. In fact if they are not I am afraid this is again actually grow into the skin and that is can to be even bigger issue. Some of them even so at this point appeared that there could be difficult to even get out. 8/17; this is the first time I am seeing this in about 3 weeks. The patient had stop by my office yesterday to discuss after completing hyperbaric therapy. Based on what she said I was expecting the worst today however apparently with dressing changes and perhaps debridement last week the surface of the wound actually looks better than what I was expecting. There is considerable depth although the last time I saw this the base of the wound was necrotic there is still some necrotic tissue on the surface of the wound bed  however in general the rest the tissue looks healthy even with some bleeding. She has been using cleaning the wound with Dakin's and then applying Prisma and then backing Dakin's wet-to-dry. This seems to work to clean the wound surface  up. They also ordered a wound VAC and I discussed this with her in depth today She is not systemically unwell. She is not running a fever there is no odor. She talks about pain over the lower part of the flap to touch. This does not appear to be related to movement of her shoulder. 8/23; the patient has been using the wound VAC for 1 week. Already a remarkable improvement and she appears to be tolerating this well we had Santyl under the wound VAC however the surface looks so clean today I am doubtful that is going to be necessary She is also being treated with HBO. This is helped her dramatically. Apparently her current certification will be up on 8/31. She is only had 19 dives and we will certainly need to recertify her up for this. Since I saw her last week I put in a secure text message to Dr. Claudia Desanctis through epic to let him know that we were going ahead with a wound VAC. He did not have any objections to this. He is offering her more surgery however in the eventuality that this does not close 8/30; the patient missed 2 treatments of HBO last week because of nausea. I think she is now on Compazine which seems to have helped. Otherwise she is tolerating HBO well. We are collagen under wound VAC. Direct depth of wound measured at 1.2 cm which I think is probably unchanged however the undermining area is down to 0.5 which I think is an improvement Objective Constitutional Sitting or standing Blood Pressure is within target range for patient.. Pulse regular and within target range for patient.Marland Kitchen Respirations regular, non-labored and within target range.. Temperature is normal and within the target range for the patient.Marland Kitchen Appears in no distress. Vitals Time Taken: 9:26 AM, Height: 67 in, Weight: 257 lbs, BMI: 40.2, Temperature: 98.3 F, Pulse: 84 bpm, Respiratory Rate: 20 breaths/min, Blood Pressure: 101/61 mmHg. General Notes: Wound exam; on the upper part of her surgical lesion close proximity to  the left anterior axillary line. The direct depth of this looks the same measured by her staff at 1.2 cm. The tissue looks fairly healthy. The undermining area at 0.5 cm on the lateral part of the wound looks actually quite good and I think is improved Integumentary (Hair, Skin) Wound #1 status is Open. Original cause of wound was Surgical Injury. The date acquired was: 01/16/2020. The wound has been in treatment 21 weeks. The wound is located on the Left Chest. The wound measures 2.8cm length x 3cm width x 1.2cm depth; 6.597cm^2 area and 7.917cm^3 volume. There is Fat Layer (Subcutaneous Tissue) exposed. There is no tunneling noted, however, there is undermining starting at 12:00 and ending at 12:00 with a maximum distance of 0.5cm. There is a medium amount of serosanguineous drainage noted. The wound margin is well defined and not attached to the wound base. There is large (67- 100%) red, pink granulation within the wound bed. There is a small (1-33%) amount of necrotic tissue within the wound bed including Adherent Slough. 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 - Tuesday with Dr. Dellia Nims Nurse Visit: - Vac dressing change Friday Bathing/ Shower/ Hygiene: May shower with protection but do not get wound dressing(s) wet. - May Negative Presssure Wound Therapy: Wound Vac to wound continuously at 114m/hg pressure Black Foam - Apply fibracol to wound base and undermiing under wound vac WOUND #1: - Chest Wound Laterality: Left Cleanser: Wound Cleanser 2 x Per Week/15 Days Discharge Instructions: Cleanse the wound with wound cleanser prior to applying a clean dressing  using gauze sponges, not tissue or cotton balls. Prim Dressing: FIBRACOL Plus Dressing, 2x2 in (collagen) 2 x Per Week/15 Days ary Discharge Instructions: Moisten collagen with saline or hydrogel 1. I have not changed the primary dressing which is fibber call and continuing with the wound VAC. Primary goal here is to get the undermining area to fill-in. Does not look like we have had much improvement in the wound depth but the length and width have improved as well as the undermining 2. Continue with HBO Electronic Signature(s) Signed: 09/04/2020 7:53:49 AM By: RLinton HamMD Entered By: RLinton Hamon 09/03/2020 10:07:03 -------------------------------------------------------------------------------- SuperBill Details Patient Name: Date of Service: ARonnie DerbyIE D. 09/03/2020 Medical Record Number: [id number] Account Number: [id number] of Birth/Sex: Treating RN: [id number](50y.o. 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

## 2020-09-04 NOTE — Progress Notes (Signed)
DEAUN, ROCHA ([id number]) Visit Report for 09/03/2020 Arrival Information Details Patient Name: Date of Service: [name] [name] 09/03/2020 9:15 Sarah M Medical Record Number: [id number] Patient Account Number: [id number] Date of Birth/Sex: Treating RN: Nov 25, 1970 (50 y.o. [name], [name] Primary Care Genna Casimir: [name] Other Clinician: Referring Tesia Lybrand: Treating Ferne Ellingwood/Extender: [name] in Treatment: 21 Visit Information History Since Last Visit Added or deleted any medications: No Patient Arrived: Ambulatory Any new allergies or adverse reactions: No Arrival Time: 09:26 Had Sarah fall or experienced change in No Accompanied By: self activities of daily living that may affect Transfer Assistance: None risk of falls: Patient Identification Verified: Yes Signs or symptoms of abuse/neglect since last visito No Secondary Verification Process Completed: Yes Hospitalized since last visit: No Patient Requires Transmission-Based Precautions: No Implantable device outside of the clinic excluding No Patient Has Alerts: No cellular tissue based products placed in the center since last visit: Has Dressing in Place as Prescribed: Yes Pain Present Now: Yes Electronic Signature(s) Signed: 09/03/2020 3:39:02 PM By: Sandre Kitty Entered By: Sandre Kitty on 09/03/2020 09:26:42 -------------------------------------------------------------------------------- Encounter Discharge Information Details Patient Name: Date of Service: Sarah [name] 09/03/2020 9:15 Sarah M Medical Record Number: [id number] Patient Account Number: [id number] Date of Birth/Sex: Treating RN: [birth year] (50 y.o. [name], [name] Primary Care Harve Spradley: [name] Other Clinician: Referring Baby Stairs: Treating Lendora Keys/Extender: [name] in Treatment: 21 Encounter Discharge Information Items Discharge Condition:  Stable Ambulatory Status: Ambulatory Discharge Destination: Home Transportation: Private Auto Accompanied By: self Schedule Follow-up Appointment: Yes Clinical Summary of Care: Patient Declined Electronic Signature(s) Signed: 09/03/2020 5:24:19 PM By: Rhae Hammock RN Entered By: Rhae Hammock on 09/03/2020 12:00:10 -------------------------------------------------------------------------------- Multi Wound Chart Details Patient Name: Date of Service: Sarah [name] 09/03/2020 9:15 Sarah M Medical Record Number: [id number] Patient Account Number: [id number] Date of Birth/Sex: Treating RN: 1970-07-02 (50 y.o. [name], [name] Primary Care Jamisha Hoeschen: [name] Other Clinician: Referring Esaiah Wanless: Treating Leeann Bady/Extender: [name] in Treatment: 21 Vital Signs Height(in): 67 Pulse(bpm): 84 Weight(lbs): 101 Blood Pressure(mmHg): 101/61 Body Mass Index(BMI): 40 Temperature(F): 98.3 Respiratory Rate(breaths/min): 20 Photos: [N/Sarah:N/Sarah] Left Chest N/Sarah N/Sarah Wound Location: Surgical Injury N/Sarah N/Sarah Wounding Event: Open Surgical Wound N/Sarah N/Sarah Primary Etiology: Necrosis (Radiation) N/Sarah N/Sarah Secondary Etiology: Hypertension, Osteoarthritis, ReceivedN/Sarah N/Sarah Comorbid History: Chemotherapy, Received Radiation 01/16/2020 N/Sarah N/Sarah Date Acquired: 21 N/Sarah N/Sarah Weeks of Treatment: Open N/Sarah N/Sarah Wound Status: 2.8x3x1.2 N/Sarah N/Sarah Measurements L x W x D (cm) 6.597 N/Sarah N/Sarah Sarah (cm) : rea 7.917 N/Sarah N/Sarah Volume (cm) : -3.70% N/Sarah N/Sarah % Reduction in Sarah rea: -13.10% N/Sarah N/Sarah % Reduction in Volume: 12 Starting Position 1 (o'clock): 12 Ending Position 1 (o'clock): 0.5 Maximum Distance 1 (cm): Yes N/Sarah N/Sarah Undermining: Full Thickness Without Exposed N/Sarah N/Sarah Classification: Support Structures Medium N/Sarah N/Sarah Exudate Amount: Serosanguineous N/Sarah N/Sarah Exudate Type: red, brown N/Sarah N/Sarah Exudate Color: Well defined, not attached N/Sarah N/Sarah Wound  Margin: Large (67-100%) N/Sarah N/Sarah Granulation Amount: Red, Pink N/Sarah N/Sarah Granulation Quality: Small (1-33%) N/Sarah N/Sarah Necrotic Amount: Fat Layer (Subcutaneous Tissue): Yes N/Sarah N/Sarah Exposed Structures: Fascia: No Tendon: No Muscle: No Joint: No Bone: No None N/Sarah N/Sarah Epithelialization: Negative Pressure Wound Therapy N/Sarah N/Sarah Procedures Performed: Maintenance (NPWT) Treatment Notes Electronic Signature(s) Signed: 09/03/2020 5:24:19 PM By: Rhae Hammock RN Signed: 09/04/2020 7:53:49 AM By: [name] MD Entered By: [name] on 09/03/2020  10:01:41 -------------------------------------------------------------------------------- Multi-Disciplinary Care Plan Details Patient Name: Date of Service: [name] [name] 09/03/2020 9:15 Sarah M Medical Record Number: [id number] Patient Account Number: [id number] Date of Birth/Sex: Treating RN: 1970/11/19 (50 y.o. [name], [name] Primary Care Aarin Bluett: [name] Other Clinician: Referring Daniqua Campoy: Treating Yahayra Geis/Extender: [name] in Treatment: 21 Active Inactive Wound/Skin Impairment Nursing Diagnoses: Impaired tissue integrity Knowledge deficit related to smoking impact on wound healing Knowledge deficit related to ulceration/compromised skin integrity Goals: Patient will demonstrate Sarah reduced rate of smoking or cessation of smoking Date Initiated: 04/09/2020 Date Inactivated: 06/18/2020 Target Resolution Date: 06/28/2020 Goal Status: Met Patient will have Sarah decrease in wound volume by X% from date: (specify in notes) Date Initiated: 04/09/2020 Date Inactivated: 07/03/2020 Target Resolution Date: 07/05/2020 Goal Status: Met Patient/caregiver will verbalize understanding of skin care regimen Date Initiated: 04/09/2020 Target Resolution Date: 09/07/2020 Goal Status: Active Ulcer/skin breakdown will have Sarah volume reduction of 30% by week 4 Date Initiated: 04/09/2020 Date Inactivated:  05/14/2020 Target Resolution Date: 05/11/2020 Unmet Reason: see wound Goal Status: Unmet meaurements. Interventions: Assess patient/caregiver ability to obtain necessary supplies Assess patient/caregiver ability to perform ulcer/skin care regimen upon admission and as needed Assess ulceration(s) every visit Provide education on smoking Notes: Electronic Signature(s) Signed: 09/03/2020 5:24:19 PM By: Rhae Hammock RN Entered By: Rhae Hammock on 09/03/2020 09:32:35 -------------------------------------------------------------------------------- Negative Pressure Wound Therapy Maintenance (NPWT) Details Patient Name: Date of Service: [name] [name] 09/03/2020 9:15 Sarah M Medical Record Number: [id number] Patient Account Number: [id number] Date of Birth/Sex: Treating RN: [birth year] (50 y.o. [name], [name] Primary Care Ettel Albergo: [name] Other Clinician: Referring Arraya Buck: Treating Yordin Rhoda/Extender: [name] in Treatment: 21 NPWT Maintenance Performed for: Wound #1 Left Chest Performed By: Rhae Hammock, RN Type: VAC System Coverage Size (sq cm): 8.4 Pressure Type: Constant Pressure Setting: 125 mmHG Drain Type: None Primary Contact: Non-Adherent Sponge/Dressing Type: Foam, Black Date Initiated: 08/21/2020 Dressing Removed: Yes Quantity of Sponges/Gauze Removed: 1 black foam Canister Changed: Yes Canister Exudate Volume: 50 Dressing Reapplied: Yes Quantity of Sponges/Gauze Inserted: 1 black foam Respones T Treatment: o tolerated well Days On NPWT : 14 Post Procedure Diagnosis Same as Pre-procedure Electronic Signature(s) Signed: 09/03/2020 5:24:19 PM By: Rhae Hammock RN Entered By: Rhae Hammock on 09/03/2020 09:39:57 -------------------------------------------------------------------------------- Pain Assessment Details Patient Name: Date of Service: Sarah [name] 09/03/2020 9:15 Sarah  M Medical Record Number: [id number] Patient Account Number: [id number] Date of Birth/Sex: Treating RN: 1970-01-30 (50 y.o. [name], [name] Primary Care Mayelin Panos: [name] Other Clinician: Referring Jodi Kappes: Treating Dora Clauss/Extender: [name] in Treatment: 21 Active Problems Location of Pain Severity and Description of Pain Patient Has Paino Yes Site Locations Rate the pain. Current Pain Level: 3 Pain Management and Medication Current Pain Management: Electronic Signature(s) Signed: 09/03/2020 3:39:02 PM By: Sandre Kitty Signed: 09/03/2020 5:24:19 PM By: Rhae Hammock RN Entered By: Sandre Kitty on 09/03/2020 09:27:21 -------------------------------------------------------------------------------- Patient/Caregiver Education Details Patient Name: Date of Service: Sarah [name] 8/30/2022andnbsp9:15 Sarah M Medical Record Number: [id number] Patient Account Number: [id number] Date of Birth/Gender: Treating RN: [birth year] (50 y.o. [name] Primary Care Physician: [name] Other Clinician: Referring Physician: Treating Physician/Extender: [name] in Treatment: 21 Education Assessment Education Provided To: Patient Education Topics Provided Smoking and Wound Healing: Methods: Explain/Verbal Responses: State content correctly Electronic Signature(s) Signed: 09/03/2020 5:24:19 PM By: Hollie Salk,  [name] RN Entered By: Rhae Hammock on 09/03/2020 09:32:54 -------------------------------------------------------------------------------- Wound Assessment Details Patient Name: Date of Service: [name] [name] 09/03/2020 9:15 Sarah M Medical Record Number: [id number] Patient Account Number: [id number] Date of Birth/Sex: Treating RN: 10/29/1970 (50 y.o. [name] Primary Care Dajaun Goldring: [name] Other Clinician: Referring [name]: Treating  Janalee Grobe/Extender: [name] in Treatment: 21 Wound Status Wound Number: 1 Primary Open Surgical Wound Etiology: Wound Location: Left Chest Secondary Necrosis (Radiation) Wounding Event: Surgical Injury Etiology: Date Acquired: 01/16/2020 Wound Status: Open Weeks Of Treatment: 21 Comorbid Hypertension, Osteoarthritis, Received Chemotherapy, Clustered Wound: No History: Received Radiation Photos Wound Measurements Length: (cm) 2.8 Width: (cm) 3 Depth: (cm) 1.2 Area: (cm) 6.597 Volume: (cm) 7.917 % Reduction in Area: -3.7% % Reduction in Volume: -13.1% Epithelialization: None Tunneling: No Undermining: Yes Starting Position (o'clock): 12 Ending Position (o'clock): 12 Maximum Distance: (cm) 0.5 Wound Description Classification: Full Thickness Without Exposed Support Structures Wound Margin: Well defined, not attached Exudate Amount: Medium Exudate Type: Serosanguineous Exudate Color: red, brown Foul Odor After Cleansing: No Slough/Fibrino Yes Wound Bed Granulation Amount: Large (67-100%) Exposed Structure Granulation Quality: Red, Pink Fascia Exposed: No Necrotic Amount: Small (1-33%) Fat Layer (Subcutaneous Tissue) Exposed: Yes Necrotic Quality: Adherent Slough Tendon Exposed: No Muscle Exposed: No Joint Exposed: No Bone Exposed: No Treatment Notes Wound #1 (Chest) Wound Laterality: Left Cleanser Wound Cleanser Discharge Instruction: Cleanse the wound with wound cleanser prior to applying Sarah clean dressing using gauze sponges, not tissue or cotton balls. Peri-Wound Care Topical Primary Dressing FIBRACOL Plus Dressing, 2x2 in (collagen) Discharge Instruction: Moisten collagen with saline or hydrogel Secondary Dressing Secured With Compression Wrap Compression Stockings Add-Ons Electronic Signature(s) Signed: 09/03/2020 5:23:58 PM By: Lorrin Jackson Signed: 09/03/2020 5:24:19 PM By: Rhae Hammock RN Entered By: Rhae Hammock on 09/03/2020 09:36:28 -------------------------------------------------------------------------------- Vitals Details Patient Name: Date of Service: Sarah [name] 09/03/2020 9:15 Sarah M Medical Record Number: [id number] Patient Account Number: [id number] Date of Birth/Sex: Treating RN: 04-Jul-1970 (50 y.o. [name], [name] Primary Care Aydee Mcnew: [name] Other Clinician: Referring Latroya Ng: Treating Talynn Lebon/Extender: [name] in Treatment: 21 Vital Signs Time Taken: 09:26 Temperature (F): 98.3 Height (in): 67 Pulse (bpm): 84 Weight (lbs): 257 Respiratory Rate (breaths/min): 20 Body Mass Index (BMI): 40.2 Blood Pressure (mmHg): 101/61 Reference Range: 80 - 120 mg / dl Electronic Signature(s) Signed: 09/03/2020 3:39:02 PM By: Sandre Kitty Entered By: Sandre Kitty on 09/03/2020 09:27:03

## 2020-09-04 NOTE — Progress Notes (Signed)
DAWNEL, BIVEN ([id number]) Visit Report for 09/03/2020 HBO Details Patient Name: Date of Service: [name] 09/03/2020 10:00 A M Medical Record Number: [id number] Patient Account Number: [id number] Date of Birth/Sex: Treating RN: 1970-01-30 (50 y.o. 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 HBO Treatment Details Treatment Number: 22 Patient Type: Outpatient Chamber Type: Monoplace Chamber Serial #: [id number] Treatment Protocol: 2.5 ATA with 90 minutes oxygen, with two 5 minute air breaks Treatment Details Compression Rate Down: 2.0 psi / minute De-Compression Rate Up: 2.5 psi / minute A breaks and breathing ir Compress Tx Pressure periods Decompress Decompress Begins Reached (leave unused spaces Begins Ends blank) Chamber Pressure (ATA 1 2.5 2.5 2.5 2.5 2.5 - - 2.5 1 ) Clock Time (24 hr) 10:36 10:48 11:18 11:23 11:53 11:58 - - 12:28 12:37 Treatment Length: 121 (minutes) Treatment Segments: 4 Vital Signs Capillary Blood Glucose Reference Range: 80 - 120 mg / dl HBO Diabetic Blood Glucose Intervention Range: <131 mg/dl or >249 mg/dl Time Vitals Blood Respiratory Capillary Blood Glucose Pulse Action Type: Pulse: Temperature: Taken: Pressure: Rate: Glucose (mg/dl): Meter #: Oximetry (%) Taken: Pre 09:26 101/61 84 20 98.3 Post 12:39 128/85 78 18 97.5 Treatment Response Treatment Toleration: Well Treatment Completion Status: Treatment Completed without Adverse Event Loris Winrow Notes No concerns with treatment given. Patient was also seen for wound care evaluation Physician HBO  Attestation: I certify that I supervised this HBO treatment in accordance with Medicare guidelines. A trained emergency response team is readily available per Yes hospital policies and procedures. Continue HBOT as ordered. Yes Electronic Signature(s) Signed: 09/04/2020 7:53:49 AM By: [name] MD Previous Signature: 09/03/2020 1:07:07 PM Version By: [name] EMT Entered By: [name] on 09/03/2020 14:56:28 -------------------------------------------------------------------------------- HBO Safety Checklist Details Patient Name: Date of Service: [name] 09/03/2020 10:00 A M Medical Record Number: [id number] Patient Account Number: [id number] Date of Birth/Sex: Treating RN: [birth year] (50 y.o. [name], [name] Primary Care Tiani Stanbery: [name] Other Clinician: Referring Cecia Egge: Treating Vinton Layson/Extender: [name] in Treatment: 21 HBO Safety Checklist Items Safety Checklist Consent Form Signed Patient voided / foley secured and emptied When did you last eato snack this morning Last dose of injectable or oral agent n/a Ostomy pouch emptied and vented if applicable NA All implantable devices assessed, documented and approved NA Intravenous access site secured and place NA Valuables secured Linens and cotton and cotton/polyester blend (less than 51% polyester) Personal oil-based products / skin lotions / body lotions removed Wigs or hairpieces removed NA Smoking or tobacco materials removed NA Books / newspapers / magazines / loose paper removed Cologne, aftershave, perfume and deodorant removed Jewelry removed (may wrap wedding band) Make-up removed Hair care products removed Battery operated devices (external) removed Heating patches and chemical warmers removed Titanium eyewear removed NA Nail polish cured greater than 10 hours Casting material cured greater than 10 hours NA Hearing aids  removed NA Loose dentures or partials removed Prosthetics have been removed NA Patient demonstrates correct use of air break device (if applicable) Patient concerns have been addressed Patient grounding bracelet on and cord attached to chamber Specifics for Inpatients (complete in addition to above) Medication sheet  sent with patient NA Intravenous medications needed or due during therapy sent with patient NA Drainage tubes (e.g. nasogastric tube or chest tube secured and vented) NA Endotracheal or Tracheotomy tube secured NA Cuff deflated of air and inflated with saline NA Airway suctioned NA Electronic Signature(s) Signed: 09/03/2020 1:07:07 PM By: [name] EMT Entered By: [name] on 09/03/2020 11:18:07

## 2020-09-04 NOTE — Progress Notes (Signed)
Sarah, Khan ([id number]) Visit Report for 09/03/2020 SuperBill Details Patient Name: Date of Service: A [name] 09/03/2020 Medical Record Number: [id number] Patient Account Number: [id number] Date of Birth/Sex: Treating RN: 05-02-1970 (50 y.o. 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 Facility Procedures CPT4 Code Description Modifier Quantity [id number] G0277-(Facility Use Only) HBOT full body chamber, 72mn , 4 ICD-10 Diagnosis Description T81.31XD Disruption of external operation (surgical) wound, not elsewhere classified, subsequent encounter 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 Physician Procedures Quantity CPT4 Code Description Modifier [id number] PHYS HYPERBARIC OXYGEN THERAPY 1 ICD-10 Diagnosis Description T81.31XD Disruption of external operation (surgical) wound, not elsewhere classified, subsequent encounter 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

## 2020-09-05 ENCOUNTER — Encounter (HOSPITAL_BASED_OUTPATIENT_CLINIC_OR_DEPARTMENT_OTHER): Payer: Medicaid Other | Attending: Internal Medicine | Admitting: Internal Medicine

## 2020-09-05 DIAGNOSIS — Z901 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: Secondary | ICD-10-CM | POA: Diagnosis not present

## 2020-09-05 DIAGNOSIS — L598 Other specified disorders of the skin and subcutaneous tissue related to radiation: Secondary | ICD-10-CM | POA: Diagnosis not present

## 2020-09-05 DIAGNOSIS — D0512 Intraductal carcinoma in situ of left breast: Secondary | ICD-10-CM | POA: Diagnosis present

## 2020-09-05 DIAGNOSIS — X58XXXD Exposure to other specified factors, subsequent encounter: Secondary | ICD-10-CM | POA: Insufficient documentation

## 2020-09-05 DIAGNOSIS — T8131XD Disruption of external operation (surgical) wound, not elsewhere classified, subsequent encounter: Secondary | ICD-10-CM | POA: Diagnosis not present

## 2020-09-05 NOTE — Progress Notes (Signed)
TASHEA, RAMSEYER ([id number]) Visit Report for 08/30/2020 SuperBill Details Patient Name: Date of Service: [name] 08/30/2020 Medical Record Number: [id number] Patient Account Number: [id number] Date of Birth/Sex: Treating RN: 1970-05-05 (50 y.o. 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 Facility Procedures CPT4 Code Description Modifier Quantity [id number] - WOUND VAC-50 SQ CM OR LESS 1 ICD-10 Diagnosis Description T81.31XD Disruption of external operation (surgical) wound, not elsewhere classified, subsequent encounter Physician Procedures Quantity CPT4 Code Description Modifier [id number] - WC PHYS TX WOUND VAC < 50 SQ CM 1 ICD-10 Diagnosis Description T81.31XD Disruption of external operation (surgical) wound, not elsewhere classified, subsequent encounter Electronic Signature(s) Signed: 08/30/2020 12:12:33 PM By: Kalman Shan DO Signed: 09/05/2020 5:19:32 PM By: Levan Hurst RN, BSN Entered By: Levan Hurst on 08/30/2020 08:39:43

## 2020-09-05 NOTE — Progress Notes (Signed)
DEAVIAN, SAWADA ([id number]) Visit Report for 08/30/2020 Arrival Information Details Patient Name: Date of Service: [name] 08/30/2020 8:00 A M Medical Record Number: [id number] Patient Account Number: [id number] Date of Birth/Sex: Treating RN: 07-Aug-1970 (50 y.o. [name], [name] Primary Care Sonnet Rizor: [name] Other Clinician: Referring Adilee Lemme: Treating Kolina Kube/Extender: [name] in Treatment: 20 Visit Information History Since Last Visit Added or deleted any medications: No Patient Arrived: Ambulatory Any new allergies or adverse reactions: No Arrival Time: 08:12 Had a fall or experienced change in No Accompanied By: alone activities of daily living that may affect Transfer Assistance: None risk of falls: Patient Identification Verified: Yes Signs or symptoms of abuse/neglect since last visito No Secondary Verification Process Completed: Yes Hospitalized since last visit: No Patient Requires Transmission-Based Precautions: No Implantable device outside of the clinic excluding No Patient Has Alerts: No cellular tissue based products placed in the center since last visit: Has Dressing in Place as Prescribed: Yes Pain Present Now: No Electronic Signature(s) Signed: 09/05/2020 5:19:32 PM By: Levan Hurst RN, BSN Entered By: Levan Hurst on 08/30/2020 08:37:37 -------------------------------------------------------------------------------- Encounter Discharge Information Details Patient Name: Date of Service: [name] 08/30/2020 8:00 A M Medical Record Number: [id number] Patient Account Number: [id number] Date of Birth/Sex: Treating RN: 1970/02/26 (50 y.o. [name] Primary Care Torin Whisner: [name] Other Clinician: Referring Holli Rengel: Treating Espen Bethel/Extender: [name] in Treatment: 20 Encounter Discharge Information Items Discharge Condition:  Stable Ambulatory Status: Ambulatory Discharge Destination: Home Transportation: Private Auto Accompanied By: alone Schedule Follow-up Appointment: Yes Clinical Summary of Care: Patient Declined Electronic Signature(s) Signed: 09/05/2020 5:19:32 PM By: Levan Hurst RN, BSN Entered By: Levan Hurst on 08/30/2020 08:39:30 -------------------------------------------------------------------------------- Negative Pressure Wound Therapy Maintenance (NPWT) Details Patient Name: Date of Service: [name], [name] 08/30/2020 8:00 A M Medical Record Number: [id number] Patient Account Number: [id number] Date of Birth/Sex: Treating RN: [birth year] (50 y.o. [name] Primary Care Kyshon Tolliver: [name] Other Clinician: Referring Patsy Zaragoza: Treating Sanayah Munro/Extender: [name] in Treatment: 20 NPWT Maintenance Performed for: Wound #1 Left Chest Performed By: Levan Hurst, RN Type: VAC System Coverage Size (sq cm): 9.9 Pressure Type: Constant Pressure Setting: 125 mmHG Drain Type: None Primary Contact: Other : collagen Sponge/Dressing Type: Foam, Black Date Initiated: 08/21/2020 Dressing Removed: No Canister Changed: Yes Dressing Reapplied: Yes Quantity of Sponges/Gauze Inserted: 1 piece black foam inserted Respones T Treatment: o pt tolerated well Days On NPWT : 10 Electronic Signature(s) Signed: 09/05/2020 5:19:32 PM By: Levan Hurst RN, BSN Entered By: Levan Hurst on 08/30/2020 08:38:57 -------------------------------------------------------------------------------- Wound Assessment Details Patient Name: Date of Service: [name] 08/30/2020 8:00 A M Medical Record Number: [id number] Patient Account Number: [id number] Date of Birth/Sex: Treating RN: Sep 11, 1970 (50 y.o. [name] Primary Care Kotaro Buer: [name] Other Clinician: Referring Aemon Koeller: Treating Deagen Krass/Extender: [name] in Treatment: 20 Wound Status Wound Number: 1 Primary Open Surgical Wound Etiology: Wound Location: Left Chest Secondary Necrosis (Radiation) Wounding Event: Surgical Injury Etiology: Date Acquired: 01/16/2020 Wound Status: Open Weeks Of Treatment: 20 Comorbid Hypertension, Osteoarthritis, Received Chemotherapy, Clustered Wound: No History: Received Radiation Wound Measurements Length: (cm) 3.3 Width: (cm) 3 Depth: (cm) 1.2 Area: (cm) 7.775 Volume: (cm) 9.331 % Reduction in Area: -22.2% % Reduction in Volume: -33.3% Epithelialization: None Tunneling: No Undermining: No Wound Description Classification: Full Thickness Without Exposed  Support Structures Wound Margin: Well defined, not attached Exudate Amount: Medium Exudate Type: Serosanguineous Exudate Color: red, brown Wound Bed Granulation Amount: Large (67-100%) Granulation Quality: Red, Pink Necrotic Amount: Small (1-33%) Necrotic Quality: Adherent Slough Foul Odor After Cleansing: No Slough/Fibrino Yes Exposed Structure Fascia Exposed: No Fat Layer (Subcutaneous Tissue) Exposed: Yes Tendon Exposed: No Muscle Exposed: No Joint Exposed: No Bone Exposed: No Electronic Signature(s) Signed: 09/05/2020 5:19:32 PM By: Levan Hurst RN, BSN Entered By: Levan Hurst on 08/30/2020 08:38:09 -------------------------------------------------------------------------------- Vitals Details Patient Name: Date of Service: [name] 08/30/2020 8:00 A M Medical Record Number: [id number] Patient Account Number: [id number] Date of Birth/Sex: Treating RN: [birth year] (50 y.o. [name] Primary Care Azekiel Cremer: [name] Other Clinician: Referring Solomon Skowronek: Treating Shepard Keltz/Extender: [name] in Treatment: 20 Vital Signs Time Taken: 08:12 Temperature (F): 98.5 Height (in): 67 Pulse (bpm): 96 Weight (lbs): 257 Respiratory Rate  (breaths/min): 18 Body Mass Index (BMI): 40.2 Blood Pressure (mmHg): 126/86 Reference Range: 80 - 120 mg / dl Electronic Signature(s) Signed: 09/05/2020 5:19:32 PM By: Levan Hurst RN, BSN Entered By: Levan Hurst on 08/30/2020 08:37:55

## 2020-09-05 NOTE — Progress Notes (Addendum)
GAIA, TETRAULT ([id number]) Visit Report for 09/05/2020 SuperBill Details Patient Name: Date of Service: [name] [name] 09/05/2020 Medical Record Number: [id number] Patient Account Number: [id number] Date of Birth/Sex: Treating RN: 01/27/1970 (50 y.o. 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 Facility Procedures CPT4 Code Description Modifier Quantity [id number] G0277-(Facility Use Only) HBOT full body chamber, 39mn , 4 Physician Procedures Quantity CPT4 Code Description Modifier [id number] WC PHYS HYPERBARIC OXYGEN THERAPY 1 ICD-10 Diagnosis Description T81.31XD Disruption of external operation (surgical) wound, not elsewhere classified, subsequent encounter 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

## 2020-09-06 ENCOUNTER — Encounter (HOSPITAL_BASED_OUTPATIENT_CLINIC_OR_DEPARTMENT_OTHER): Payer: Medicaid Other | Admitting: Internal Medicine

## 2020-09-06 ENCOUNTER — Other Ambulatory Visit: Payer: Self-pay

## 2020-09-06 DIAGNOSIS — T8131XD Disruption of external operation (surgical) wound, not elsewhere classified, subsequent encounter: Secondary | ICD-10-CM

## 2020-09-06 DIAGNOSIS — Z901 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: Secondary | ICD-10-CM | POA: Diagnosis not present

## 2020-09-06 DIAGNOSIS — D0512 Intraductal carcinoma in situ of left breast: Secondary | ICD-10-CM | POA: Diagnosis not present

## 2020-09-06 DIAGNOSIS — L598 Other specified disorders of the skin and subcutaneous tissue related to radiation: Secondary | ICD-10-CM | POA: Diagnosis not present

## 2020-09-06 NOTE — Progress Notes (Signed)
TARAMARIE, SCHNAKE ([id number]) Visit Report for 09/04/2020 SuperBill Details Patient Name: Date of Service: [name] 09/04/2020 Medical Record Number: [id number] Patient Account Number: [id number] Date of Birth/Sex: Treating RN: 1970/01/19 (50 y.o. 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 Facility Procedures CPT4 Code Description Modifier Quantity [id number] G0277-(Facility Use Only) HBOT full body chamber, 20mn , 4 ICD-10 Diagnosis Description T81.31XD Disruption of external operation (surgical) wound, not elsewhere classified, subsequent encounter L59.8 Other specified disorders of the skin and subcutaneous tissue 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 Physician Procedures Quantity CPT4 Code Description Modifier [id number]- WC PHYS HYPERBARIC OXYGEN THERAPY 1 ICD-10 Diagnosis Description T81.31XD Disruption of external operation (surgical) wound, not elsewhere classified, subsequent encounter 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

## 2020-09-06 NOTE — Progress Notes (Signed)
[name], [name] ([id number]) Visit Report for 09/04/2020 Arrival Information Details Patient Name: Date of Service: Sarah [name] [name] 09/04/2020 10:00 Sarah M Medical Record Number: [id number] Patient Account Number: [id number] Date of Birth/Sex: Treating RN: January 29, 1970 (50 y.o. [name], [name] Primary Care Valeria Boza: [name] Other Clinician: [name] Referring Twain Stenseth: Treating Annaleigha Woo/Extender: Randon Goldsmith in Treatment: 21 Visit Information History Since Last Visit All ordered tests and consults were completed: Yes Patient Arrived: Ambulatory Added or deleted any medications: No Arrival Time: 10:14 Any new allergies or adverse reactions: No Accompanied By: self Had Sarah fall or experienced change in No Transfer Assistance: None activities of daily living that may affect Patient Identification Verified: Yes risk of falls: Secondary Verification Process Completed: Yes Signs or symptoms of abuse/neglect since last visito No Patient Requires Transmission-Based Precautions: No Hospitalized since last visit: No Patient Has Alerts: No Implantable device outside of the clinic excluding No cellular tissue based products placed in the center since last visit: Pain Present Now: No Electronic Signature(s) Signed: 09/06/2020 4:26:36 PM By: [name] EMT Entered By: [name] on 09/04/2020 11:26:11 -------------------------------------------------------------------------------- Encounter Discharge Information Details Patient Name: Date of Service: Sarah Crochet V [name] 09/04/2020 10:00 Sarah M Medical Record Number: [id number] Patient Account Number: [id number] Date of Birth/Sex: Treating RN: January 14, 1970 (50 y.o. [name] Primary Care Maida Widger: [name] Other Clinician: [name] Referring Autumn Gunn: Treating Nitya Cauthon/Extender: Randon Goldsmith in Treatment: 21 Encounter  Discharge Information Items Discharge Condition: Stable Ambulatory Status: Ambulatory Discharge Destination: Home Transportation: Private Auto Accompanied By: self Schedule Follow-up Appointment: No Clinical Summary of Care: Electronic Signature(s) Signed: 09/06/2020 4:26:36 PM By: [name] EMT Entered By: [name] on 09/04/2020 13:01:22 -------------------------------------------------------------------------------- Vitals Details Patient Name: Date of Service: Sarah Crochet V [name] 09/04/2020 10:00 Sarah M Medical Record Number: [id number] Patient Account Number: [id number] Date of Birth/Sex: Treating RN: 06-01-1970 (50 y.o. [name] Primary Care Kym Scannell: [name] Other Clinician: [name] Referring Keriana Sarsfield: Treating Brooklyn Alfredo/Extender: Randon Goldsmith in Treatment: 21 Vital Signs Time Taken: 10:42 Temperature (F): 97.7 Height (in): 67 Pulse (bpm): 85 Weight (lbs): 257 Respiratory Rate (breaths/min): 18 Body Mass Index (BMI): 40.2 Blood Pressure (mmHg): 113/73 Reference Range: 80 - 120 mg / dl Electronic Signature(s) Signed: 09/06/2020 4:26:36 PM By: [name] EMT Entered By: [name] on 09/04/2020 11:27:08

## 2020-09-06 NOTE — Progress Notes (Signed)
[name], [name] ([id number]) Visit Report for 09/04/2020 HBO Details Patient Name: Date of Service: [name] [name] 09/04/2020 10:00 Sarah M Medical Record Number: [id number] Patient Account Number: [id number] Date of Birth/Sex: Treating RN: February 14, 1970 (50 y.o. 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 HBO Treatment Details Treatment Number: 23 Patient Type: Outpatient Chamber Type: Monoplace Chamber Serial #: [id number] Treatment Protocol: 2.5 ATA with 90 minutes oxygen, with two 5 minute air breaks Treatment Details Compression Rate Down: 2.0 psi / minute De-Compression Rate Up: 2.5 psi / minute Sarah breaks and breathing ir Compress Tx Pressure periods Decompress Decompress Begins Reached (leave unused spaces Begins Ends blank) Chamber Pressure (ATA 1 2.5 2.5 2.5 2.5 2.5 - - 2.5 1 ) Clock Time (24 hr) 10:45 10:57 11:27 11:32 12:02 12:07 - - 12:37 12:47 Treatment Length: 122 (minutes) Treatment Segments: 4 Vital Signs Capillary Blood Glucose Reference Range: 80 - 120 mg / dl HBO Diabetic Blood Glucose Intervention Range: <131 mg/dl or >249 mg/dl Time Vitals Blood Respiratory Capillary Blood Glucose Pulse Action Type: Pulse: Temperature: Taken: Pressure: Rate: Glucose (mg/dl): Meter #: Oximetry (%) Taken: Pre 10:42 113/73 85 18 97.7 Post 12:50 112/84 76 18 98 Treatment Response Treatment Toleration: Well Treatment Completion Status: Treatment Completed without Adverse Event Treatment Notes Patient tolerated treatment well. The clamp on the wound vac hose became caught on exit out of the chamber where  the rails meet the door of the chamber. Patient alerted me as it happened and stopped travel of the tray. Patient states that the seal was not broken on the wound vac. Patient and I plan to make sure the hose is secured before moving the hyperbaric bed/tray. Electronic Signature(s) Signed: 09/04/2020 5:21:16 PM By: Sarah Keeler PA-C Signed: 09/06/2020 4:26:36 PM By: [name] EMT Entered By: [name] on 09/04/2020 13:05:18 -------------------------------------------------------------------------------- HBO Safety Checklist Details Patient Name: Date of Service: [name] [name] 09/04/2020 10:00 Sarah M Medical Record Number: [id number] Patient Account Number: [id number] Date of Birth/Sex: Treating RN: 1970-02-12 (50 y.o. [name] Primary Care Elliona Doddridge: [name] Other Clinician: Donavan Khan Referring Zanovia Rotz: Treating Kayton Dunaj/Extender: [name] in Treatment: 21 HBO Safety Checklist Items Safety Checklist Consent Form Signed Patient voided / foley secured and emptied When did you last eato snack this AM Last dose of injectable or oral agent n/Sarah Ostomy pouch emptied and vented if applicable NA All implantable devices assessed, documented and approved NA Intravenous access site secured and place NA Valuables secured Linens and cotton and cotton/polyester blend (less than 51% polyester) Personal oil-based products / skin lotions / body lotions removed Wigs or hairpieces removed NA Smoking or tobacco materials removed NA Books / newspapers / magazines / loose paper removed Cologne, aftershave, perfume and deodorant removed Jewelry removed (may wrap wedding band) Make-up removed Hair care products removed Battery operated devices (external) removed Heating patches and chemical warmers removed Titanium eyewear removed NA Nail polish cured greater than 10 hours Casting material cured greater than 10  hours NA Hearing aids removed NA Loose dentures or partials removed Prosthetics have been removed NA Patient demonstrates correct use of air break device (if applicable) Patient  concerns have been addressed Patient grounding bracelet on and cord attached to chamber Specifics for Inpatients (complete in addition to above) Medication sheet sent with patient NA Intravenous medications needed or due during therapy sent with patient NA Drainage tubes (e.g. nasogastric tube or chest tube secured and vented) NA Endotracheal or Tracheotomy tube secured NA Cuff deflated of air and inflated with saline NA Airway suctioned NA Electronic Signature(s) Signed: 09/06/2020 4:26:36 PM By: [name] EMT Entered By: [name] on 09/04/2020 11:28:34

## 2020-09-06 NOTE — Progress Notes (Signed)
KAROL, SEGAWA ([id number]) Visit Report for 09/06/2020 Arrival Information Details Patient Name: Date of Service: [name] 09/06/2020 8:00 A M Medical Record Number: [id number] Patient Account Number: [id number] Date of Birth/Sex: Treating RN: 1970/12/19 (50 y.o. [name], [name] Primary Care Ilisa Hayworth: [name] Other Clinician: Referring Houa Ackert: Treating Auriah Hollings/Extender: [name] in Treatment: 21 Visit Information History Since Last Visit Added or deleted any medications: No Patient Arrived: Ambulatory Any new allergies or adverse reactions: No Arrival Time: 08:23 Had a fall or experienced change in No Accompanied By: self activities of daily living that may affect Transfer Assistance: None risk of falls: Patient Identification Verified: Yes Signs or symptoms of abuse/neglect since last visito No Secondary Verification Process Completed: Yes Hospitalized since last visit: No Patient Requires Transmission-Based Precautions: No Implantable device outside of the clinic excluding No Patient Has Alerts: No cellular tissue based products placed in the center since last visit: Has Dressing in Place as Prescribed: Yes Pain Present Now: No Electronic Signature(s) Signed: 09/06/2020 12:11:16 PM By: Baruch Gouty RN, BSN Entered By: Baruch Gouty on 09/06/2020 08:25:04 -------------------------------------------------------------------------------- Encounter Discharge Information Details Patient Name: Date of Service: [name] 09/06/2020 8:00 A M Medical Record Number: [id number] Patient Account Number: [id number] Date of Birth/Sex: Treating RN: 1970-06-09 (51 y.o. [name] Primary Care Tyhesha Dutson: [name] Other Clinician: Referring Erdem Naas: Treating Zania Kalisz/Extender: [name] in Treatment: 21 Encounter Discharge Information Items Discharge Condition:  Stable Ambulatory Status: Ambulatory Discharge Destination: Home Transportation: Private Auto Accompanied By: self Schedule Follow-up Appointment: Yes Clinical Summary of Care: Patient Declined Electronic Signature(s) Signed: 09/06/2020 12:11:16 PM By: Baruch Gouty RN, BSN Entered By: Baruch Gouty on 09/06/2020 08:43:18 -------------------------------------------------------------------------------- Negative Pressure Wound Therapy Maintenance (NPWT) Details Patient Name: Date of Service: [name], [name] 09/06/2020 8:00 A M Medical Record Number: [id number] Patient Account Number: [id number] Date of Birth/Sex: Treating RN: 01-Sep-1970 (50 y.o. [name] Primary Care Christain Mcraney: [name] Other Clinician: Referring Revecca Nachtigal: Treating Thaniel Coluccio/Extender: [name] in Treatment: 21 NPWT Maintenance Performed for: Wound #1 Left Chest Performed By: Baruch Gouty, RN Type: VAC System Coverage Size (sq cm): 8.4 Pressure Type: Constant Pressure Setting: 125 mmHG Drain Type: None Primary Contact: Other : prisma Sponge/Dressing Type: Foam, Black Date Initiated: 08/21/2020 Dressing Removed: No Quantity of Sponges/Gauze Removed: removed at home by patient Canister Changed: No Dressing Reapplied: No Quantity of Sponges/Gauze Inserted: 1 Respones T Treatment: o good Days On NPWT : 17 Electronic Signature(s) Signed: 09/06/2020 12:11:16 PM By: Baruch Gouty RN, BSN Entered By: Baruch Gouty on 09/06/2020 08:42:25 -------------------------------------------------------------------------------- Patient/Caregiver Education Details Patient Name: Date of Service: [name] 9/2/2022andnbsp8:00 A M Medical Record Number: [id number] Patient Account Number: [id number] Date of Birth/Gender: Treating RN: [birth year] (50 y.o. [name] Primary Care Physician: [name] Other Clinician: Referring  Physician: Treating Physician/Extender: [name] in Treatment: 21 Education Assessment Education Provided To: Patient Education Topics Provided Hyperbaric Oxygenation: Methods: Explain/Verbal Responses: Reinforcements needed, State content correctly Wound/Skin Impairment: Methods: Explain/Verbal Responses: Reinforcements needed, State content correctly Electronic Signature(s) Signed: 09/06/2020 12:11:16 PM By: Baruch Gouty RN, BSN Entered By: Baruch Gouty on 09/06/2020 08:43:04 -------------------------------------------------------------------------------- Wound Assessment Details Patient Name: Date of Service: [name] 09/06/2020 8:00 A M Medical Record Number: [id number] Patient Account Number: [id number] Date of Birth/Sex: Treating RN:  04/24/1970 (50 y.o. [name] Primary Care Enna Warwick: [name] Other Clinician: Referring Kilie Rund: Treating Ravonda Brecheen/Extender: [name] in Treatment: 21 Wound Status Wound Number: 1 Primary Open Surgical Wound Etiology: Wound Location: Left Chest Secondary Necrosis (Radiation) Wounding Event: Surgical Injury Etiology: Date Acquired: 01/16/2020 Wound Status: Open Weeks Of Treatment: 21 Comorbid Hypertension, Osteoarthritis, Received Chemotherapy, Clustered Wound: No History: Received Radiation Wound Measurements Length: (cm) 2.8 Width: (cm) 3 Depth: (cm) 1.2 Area: (cm) 6.597 Volume: (cm) 7.917 % Reduction in Area: -3.7% % Reduction in Volume: -13.1% Epithelialization: Small (1-33%) Tunneling: No Undermining: Yes Wound Description Classification: Full Thickness Without Exposed Support Structures Wound Margin: Well defined, not attached Exudate Amount: Medium Exudate Type: Serosanguineous Exudate Color: red, brown Foul Odor After Cleansing: No Slough/Fibrino Yes Wound Bed Granulation Amount: Large (67-100%) Exposed  Structure Granulation Quality: Red, Pink Fascia Exposed: No Necrotic Amount: Small (1-33%) Fat Layer (Subcutaneous Tissue) Exposed: Yes Necrotic Quality: Adherent Slough Tendon Exposed: No Muscle Exposed: No Joint Exposed: No Bone Exposed: No Treatment Notes Wound #1 (Chest) Wound Laterality: Left Cleanser Wound Cleanser Discharge Instruction: Cleanse the wound with wound cleanser prior to applying a clean dressing using gauze sponges, not tissue or cotton balls. Peri-Wound Care Topical Primary Dressing FIBRACOL Plus Dressing, 2x2 in (collagen) Discharge Instruction: Moisten collagen with saline or hydrogel Secondary Dressing Secured With Compression Wrap Compression Stockings Add-Ons Notes VAC Electronic Signature(s) Signed: 09/06/2020 12:11:16 PM By: Baruch Gouty RN, BSN Entered By: Baruch Gouty on 09/06/2020 08:40:29 -------------------------------------------------------------------------------- [place] Details Patient Name: Date of Service: [name] 09/06/2020 8:00 A M Medical Record Number: [id number] Patient Account Number: [id number] Date of Birth/Sex: Treating RN: 04-06-1970 (50 y.o. [name], [name] Primary Care Ardys Hataway: [name] Other Clinician: Referring Natahsa Marian: Treating Malaak Stach/Extender: [name] in Treatment: 21 Vital Signs Time Taken: 08:25 Temperature (F): 97.9 Height (in): 67 Pulse (bpm): 86 Source: Stated Respiratory Rate (breaths/min): 18 Weight (lbs): 257 Blood Pressure (mmHg): 128/86 Source: Stated Reference Range: 80 - 120 mg / dl Body Mass Index (BMI): 40.2 Electronic Signature(s) Signed: 09/06/2020 12:11:16 PM By: Baruch Gouty RN, BSN Entered By: Baruch Gouty on 09/06/2020 08:25:33

## 2020-09-06 NOTE — Progress Notes (Addendum)
OMEKA, REICHERT ([id number]) Visit Report for 09/05/2020 HBO Details Patient Name: Date of Service: [name] 09/05/2020 10:00 A M Medical Record Number: [id number] Patient Account Number: [id number] Date of Birth/Sex: Treating RN: 1970/02/10 (50 y.o. 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 HBO Treatment Details Treatment Number: 24 Patient Type: Outpatient Chamber Type: Monoplace Chamber Serial #: [id number] Treatment Protocol: 2.5 ATA with 90 minutes oxygen, with two 5 minute air breaks Treatment Details Compression Rate Down: 2.0 psi / minute De-Compression Rate Up: 2.5 psi / minute A breaks and breathing ir Compress Tx Pressure periods Decompress Decompress Begins Reached (leave unused spaces Begins Ends blank) Chamber Pressure (ATA 1 2.5 2.5 2.5 2.5 2.5 - - 2.5 1 ) Clock Time (24 hr) 10:02 10:14 10:44 10:49 11:19 11:24 - - 11:54 12:04 Treatment Length: 122 (minutes) Treatment Segments: 4 Vital Signs Capillary Blood Glucose Reference Range: 80 - 120 mg / dl HBO Diabetic Blood Glucose Intervention Range: <131 mg/dl or >249 mg/dl Time Vitals Blood Respiratory Capillary Blood Glucose Pulse Action Type: Pulse: Temperature: Taken: Pressure: Rate: Glucose (mg/dl): Meter #: Oximetry (%) Taken: Pre 09:58 115/75 93 18 97.8 99 Post 12:06 112/75 78 18 97.8 Treatment Response Treatment Toleration: Well Treatment Completion Status: Treatment Completed without Adverse Event Saoirse Legere Notes No concerns with treatment given Physician HBO Attestation: I certify that I supervised this HBO treatment in  accordance with Medicare guidelines. A trained emergency response team is readily available per Yes hospital policies and procedures. Continue HBOT as ordered. Yes Electronic Signature(s) Signed: 09/18/2020 12:59:02 PM By: [name] EMT Signed: 09/19/2020 4:57:28 PM By: [name] MD Previous Signature: 09/05/2020 5:31:22 PM Version By: [name] MD Previous Signature: 09/05/2020 12:19:01 PM Version By: [name] EMT Entered By: [name] on 09/18/2020 12:59:02 -------------------------------------------------------------------------------- HBO Safety Checklist Details Patient Name: Date of Service: [name] 09/05/2020 10:00 A M Medical Record Number: [id number] Patient Account Number: [id number] Date of Birth/Sex: Treating RN: 1970/06/21 (50 y.o. [name], [name] Primary Care Lakyla Biswas: [name] Other Clinician: [name] Referring Johan Antonacci: Treating Jayion Schneck/Extender: [name] in Treatment: 21 HBO Safety Checklist Items Safety Checklist Consent Form Signed Patient voided / foley secured and emptied When did you last eato snack this AM Last dose of injectable or oral agent n/a Ostomy pouch emptied and vented if applicable NA All implantable devices assessed, documented and approved NA Intravenous access site secured and place NA Valuables secured Linens and cotton and cotton/polyester blend (less than 51% polyester) Personal oil-based products / skin lotions / body lotions removed Wigs or hairpieces removed NA Smoking or tobacco materials removed NA Books / newspapers / magazines / loose paper removed Cologne, aftershave, perfume and deodorant removed Jewelry removed (may wrap wedding band) Make-up removed Hair care products removed Battery operated devices (external) removed Heating patches and chemical warmers removed Titanium eyewear removed NA Nail polish cured greater than  10 hours Casting material cured greater than 10 hours NA Hearing aids removed NA Loose dentures or partials removed Prosthetics have been removed NA Patient demonstrates correct use of air break device (if applicable) Patient concerns have been addressed Patient grounding bracelet on and cord  attached to chamber Specifics for Inpatients (complete in addition to above) Medication sheet sent with patient NA Intravenous medications needed or due during therapy sent with patient NA Drainage tubes (e.g. nasogastric tube or chest tube secured and vented) NA Endotracheal or Tracheotomy tube secured NA Cuff deflated of air and inflated with saline NA Airway suctioned NA Electronic Signature(s) Signed: 09/06/2020 4:26:36 PM By: [name] EMT Entered By: [name] on 09/05/2020 10:53:48

## 2020-09-06 NOTE — Progress Notes (Signed)
[name], [name] ([id number]) Visit Report for 09/06/2020 Arrival Information Details Patient Name: Date of Service: [name] [name] 09/06/2020 10:00 Sarah M Medical Record Number: [id number] Patient Account Number: [id number] Date of Birth/Sex: Treating RN: 01-Sep-1970 (50 y.o. [name], [name] Primary Care Rhylie Stehr: [name] Other Clinician: [name] Referring Gustin Zobrist: Treating Chyrl Elwell/Extender: [name] in Treatment: 21 Visit Information History Since Last Visit All ordered tests and consults were completed: Yes Patient Arrived: Ambulatory Added or deleted any medications: No Arrival Time: 08:43 Any new allergies or adverse reactions: No Accompanied By: self Had Sarah fall or experienced change in No Transfer Assistance: None activities of daily living that may affect Patient Identification Verified: Yes risk of falls: Secondary Verification Process Completed: Yes Signs or symptoms of abuse/neglect since last visito No Patient Requires Transmission-Based Precautions: No Hospitalized since last visit: No Patient Has Alerts: No Implantable device outside of the clinic excluding No cellular tissue based products placed in the center since last visit: Pain Present Now: No Electronic Signature(s) Signed: 09/06/2020 4:26:36 PM By: [name] EMT Entered By: [name] on 09/06/2020 08:47:33 -------------------------------------------------------------------------------- Encounter Discharge Information Details Patient Name: Date of Service: [name] V [name] 09/06/2020 10:00 Sarah M Medical Record Number: [id number] Patient Account Number: [id number] Date of Birth/Sex: Treating RN: 14-May-1970 (51 y.o. [name] Primary Care Shandra Szymborski: [name] Other Clinician: [name] Referring Rashied Corallo: Treating Jessey Stehlin/Extender: [name] in Treatment: 21 Encounter  Discharge Information Items Discharge Condition: Stable Ambulatory Status: Ambulatory Discharge Destination: Home Transportation: Private Auto Accompanied By: self Schedule Follow-up Appointment: No Clinical Summary of Care: Electronic Signature(s) Signed: 09/06/2020 4:26:36 PM By: [name] EMT Entered By: [name] on 09/06/2020 11:04:39 -------------------------------------------------------------------------------- Vitals Details Patient Name: Date of Service: [name] V [name] 09/06/2020 10:00 Sarah M Medical Record Number: [id number] Patient Account Number: [id number] Date of Birth/Sex: Treating RN: 1970/02/12 (50 y.o. [name] Primary Care Ryken Paschal: [name] Other Clinician: [name] Referring Markeia Harkless: Treating Kolsen Choe/Extender: [name] in Treatment: 21 Vital Signs Time Taken: 08:25 Temperature (F): 97.9 Height (in): 67 Pulse (bpm): 86 Weight (lbs): 257 Respiratory Rate (breaths/min): 18 Body Mass Index (BMI): 40.2 Blood Pressure (mmHg): 128/86 Reference Range: 80 - 120 mg / dl Electronic Signature(s) Signed: 09/06/2020 4:26:36 PM By: [name] EMT Entered By: [name] on 09/06/2020 08:48:05

## 2020-09-06 NOTE — Progress Notes (Signed)
MEEKA, KANITZ ([id number]) Visit Report for 09/05/2020 Arrival Information Details Patient Name: Date of Service: [name] [name] 09/05/2020 10:00 Sarah M Medical Record Number: [id number] Patient Account Number: [id number] Date of Birth/Sex: Treating RN: May 15, 1970 (50 y.o. [name], [name] Primary Care Elise Knobloch: [name] Other Clinician: [name] Referring Gema Ringold: Treating Garmon Dehn/Extender: [name] in Treatment: 21 Visit Information History Since Last Visit All ordered tests and consults were completed: Yes Patient Arrived: Ambulatory Added or deleted any medications: No Arrival Time: 09:40 Any new allergies or adverse reactions: No Accompanied By: self Had Sarah fall or experienced change in No Transfer Assistance: None activities of daily living that may affect Patient Identification Verified: Yes risk of falls: Secondary Verification Process Completed: Yes Signs or symptoms of abuse/neglect since last visito No Patient Requires Transmission-Based Precautions: No Hospitalized since last visit: No Patient Has Alerts: No Implantable device outside of the clinic excluding No cellular tissue based products placed in the center since last visit: Pain Present Now: No Electronic Signature(s) Signed: 09/06/2020 4:26:36 PM By: [name] EMT Entered By: [name] on 09/05/2020 10:51:13 -------------------------------------------------------------------------------- Encounter Discharge Information Details Patient Name: Date of Service: Sarah Crochet V [name] 09/05/2020 10:00 Sarah M Medical Record Number: [id number] Patient Account Number: [id number] Date of Birth/Sex: Treating RN: [name] (50 y.o. [name] Primary Care Mycala Warshawsky: [name] Other Clinician: [name] Referring Patrici Minnis: Treating Sayre Witherington/Extender: [name] in Treatment: 21 Encounter Discharge  Information Items Discharge Condition: Stable Ambulatory Status: Ambulatory Discharge Destination: Home Transportation: Private Auto Accompanied By: self Schedule Follow-up Appointment: No Clinical Summary of Care: Electronic Signature(s) Signed: 09/05/2020 12:19:54 PM By: [name] EMT Entered By: [name] on 09/05/2020 12:19:54 -------------------------------------------------------------------------------- Vitals Details Patient Name: Date of Service: Sarah Crochet V [name] 09/05/2020 10:00 Sarah M Medical Record Number: [id number] Patient Account Number: [id number] Date of Birth/Sex: Treating RN: [birth year] (50 y.o. [name], [name] Primary Care Master Touchet: [name] Other Clinician: [name] Referring Alga Southall: Treating Dontavia Brand/Extender: [name] in Treatment: 21 Vital Signs Time Taken: 09:58 Temperature (F): 97.8 Height (in): 67 Pulse (bpm): 93 Weight (lbs): 257 Respiratory Rate (breaths/min): 18 Body Mass Index (BMI): 40.2 Blood Pressure (mmHg): 115/75 Reference Range: 80 - 120 mg / dl Airway Pulse Oximetry (%): 99 Electronic Signature(s) Signed: 09/06/2020 4:26:36 PM By: [name] EMT Entered By: [name] on 09/05/2020 10:52:05

## 2020-09-07 MED FILL — Potassium Chloride Microencapsulated Crys ER Tab 20 mEq: ORAL | 30 days supply | Qty: 30 | Fill #0 | Status: AC

## 2020-09-09 ENCOUNTER — Other Ambulatory Visit (HOSPITAL_COMMUNITY): Payer: Self-pay

## 2020-09-10 ENCOUNTER — Encounter (HOSPITAL_BASED_OUTPATIENT_CLINIC_OR_DEPARTMENT_OTHER): Payer: Medicaid Other | Admitting: Internal Medicine

## 2020-09-10 ENCOUNTER — Other Ambulatory Visit (HOSPITAL_COMMUNITY): Payer: Self-pay

## 2020-09-10 ENCOUNTER — Other Ambulatory Visit: Payer: Self-pay

## 2020-09-10 DIAGNOSIS — T8131XD Disruption of external operation (surgical) wound, not elsewhere classified, subsequent encounter: Secondary | ICD-10-CM | POA: Diagnosis not present

## 2020-09-10 NOTE — Progress Notes (Signed)
[name], [name] ([id number]) Visit Report for 09/06/2020 SuperBill Details Patient Name: Date of Service: [name] 09/06/2020 Medical Record Number: [id number] Patient Account Number: [id number] Date of Birth/Sex: Treating RN: [birth year] (50 y.o. 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 Facility Procedures CPT4 Code Description Modifier Quantity [id number]-(Facility Use Only) HBOT full body chamber, 78mn , 4 ICD-10 Diagnosis Description T81.31XD Disruption of external operation (surgical) wound, not elsewhere classified, subsequent encounter 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 Physician Procedures Quantity CPT4 Code Description Modifier [id number] PHYS HYPERBARIC OXYGEN THERAPY 1 ICD-10 Diagnosis Description T81.31XD Disruption of external operation (surgical) wound, not elsewhere classified, subsequent encounter 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

## 2020-09-10 NOTE — Progress Notes (Addendum)
[name], [name] ([id number]) Visit Report for 09/10/2020 Debridement Details Patient Name: Date of Service: [name] 09/10/2020 9:00 A M Medical Record Number: [id number] Patient Account Number: [id number] Date of Birth/Sex: Treating RN: 03-17-1970 (50 y.o. [name], [name] Primary Care Provider: [name] Other Clinician: Referring Provider: Treating Provider/Extender: [name] in Treatment: 22 Debridement Performed for Assessment: Wound #1 Left Chest Performed By: Physician Ricard Dillon., MD Debridement Type: Debridement Level of Consciousness (Pre-procedure): Awake and Alert Pre-procedure Verification/Time Out Yes - 10:05 Taken: Start Time: 10:05 T Area Debrided (L x W): otal 2.8 (cm) x 3 (cm) = 8.4 (cm) Tissue and other material debrided: Viable, Non-Viable, Slough, Subcutaneous, Slough Level: Skin/Subcutaneous Tissue Debridement Description: Excisional Instrument: Curette Bleeding: Minimum Hemostasis Achieved: Pressure End Time: 10:06 Procedural Pain: 0 Post Procedural Pain: 0 Response to Treatment: Procedure was tolerated well Level of Consciousness (Post- Awake and Alert procedure): Post Debridement Measurements of Total Wound Length: (cm) 2.8 Width: (cm) 3 Depth: (cm) 1.3 Volume: (cm) 8.577 Character of Wound/Ulcer Post Debridement: Improved Post Procedure Diagnosis Same as Pre-procedure Electronic Signature(s) Signed: 09/10/2020 5:13:37 PM By: [name] MD Signed: 09/10/2020 5:22:11 PM By: Rhae Hammock RN Entered By: [name] on 09/10/2020 10:40:43 -------------------------------------------------------------------------------- HPI Details Patient Name: Date of Service: Sarah Crochet V [name] 09/10/2020 9:00 A M Medical Record Number: [id number] Patient Account Number: [id number] Date of Birth/Sex: Treating RN: Nov 10, 1970 (50 y.o. [name], [name] Primary Care Provider: [name] Other Clinician: Referring Provider: Treating Provider/Extender: [name] in Treatment: 3 History of Present Illness HPI Description: After canADMISSION 04/09/2020 This is a 50 year old woman who is history of breast cancer in the left breast goes back to 2018. He was found to have a mass in her left breast. She underwent a left back lumpectomy and then subsequent chemo that she did not tolerate very well. She had seed implants placed but then underwent external beam a radiation in the spring 2019. 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 Her adjunctive radiation was from 03/11/2017 through 04/21/2017. Unfortunately she had a left breast biopsy on 11/24/2019 and it was again positive for an invasive ductal carcinoma grade 3 he is to resume at receptor 80% positive. HER-2 negative progesterone negative. She opted for bilateral mastectomies which were done on 01/09/2020 on the right with no evidence of malignancy on the left stage IIb invasive ductal carcinoma. She again received chemotherapy but she did not tolerate it because of wound dehiscence and low platelet counts according to the patient. Unfortunately her mastectomy wound dehisced on the left breast. She was taken back to the OR by [name] of general surgery on 33/24/22 and she underwent a debridement and subsequent attempt at closure. She had been previously treated with Augmentin for suspected underlying infection. Once again the wound has dehisced in the mid aspect. It is roughly 1 cm deep there is tunnels both medially and laterally she. She still has sutures in place. Still describes constant pain drainage. She also has a drain still in place that drains about 10 cc/day. Currently she is changing ABD pad several times a day 4/12; patient went back to see Dr.  Donne Hazel who remove most of the stitches and debrided her wound. She has been using silver alginate culture I did last time which  was a PCR culture showed Peptostreptococcus, Enterococcus faecalis and Staphylococcus epidermidis which is of questionable significance. She has a larger wound now that the sutures have been removed. There are undermining areas superior medial superior lateral and a deep tunneling area in the inferior part of the wound. There is no obvious purulence. I started her on Augmentin on 04/12/2020 and I will likely extend this for another week AMAZINGLY; she is already been approved for HBO and will start today. [name] Echocardiogram which I reviewed from 2019 showed a normal ejection fraction.. I went over side effects of HBO with the patient and answered any of her questions. Her husband was present 5/3; its been a while since have seen this wound. She went to see [name] about 2-1/2 weeks ago according to the patient he can remove some of the surrounding debris. She will see him again later this week or early next week. She has been using silver alginate. A lot of this is problematic since she has Medicaid which traditionally does not cover wound care supplies although it traditionally does not cover HBO either Her wound actually looks quite a bit better than the last time I saw this better looking surface healthy looking tissue. She has tunnels at 810 2 in 6 the deepest of these is at 2 at 2.2 cm there is no exposed bone. 5/10; the patient was coming in for nurse visit today but requested that I see the wound because of surface discoloration. She states the Iodoflex makes the surface of this turned "white". She is not really complaining of undue tenderness. She is changing this every second day. The goal of this had been to get the surface of the wound to a vibrant red color and then potentially look at changing to a silver collagen-based dressing,  Puraply, and then perhaps an alternative skin substituteo Derma vest. The patient tells me she has an appoint with her oncologist [name] on the 19th. She apparently think she is going to get another PET scan. Based on this I would like his comment about whether there is possibility of any malignancy in the wound itself or just underneath it. Up to now we have not really thought that was true 5/17; nice improvement in the chest wound. Less depth. We have been using Iodoflex to clean off the wound surface this seems to have done nicely. Patient went to see [name] 2 days ago who has referred her to see Dr. Claudia Desanctis. I certainly agree with the thought the surface of the wound looks a lot better now and I think consideration of plastic surgery is certainly something the patient should discuss. I have talked to the patient about this today and I certainly support a conversation with Dr. Claudia Desanctis. I also wondered about is there any residual cancer in this. [name] apparently did not seem to think so per the patient although I do not see that in the text conversations we have had. I also wonder about a skin substitute if her version of Medicare [[employer]] would cover this. [name] is apparently considering starting her on [place]. Patient is already very scared about the side effects but she will discuss this with [name] on 5/19 5/31; patient's wound about the same in terms of size she still has the tunneling area to the left. Surface of the wound looks a lot better there has been some filling in the granulation I have been included on serial text between  Dr. Callie Fielding surgery] Dr. Ruffin Frederick oncology] and Dr. Para Skeans surgery]. I think there is not a lot of thought that the patient has recurrence of her cancer her MRI was indeterminant but [name] did not feel that there was any reason to think she has a major recurrence and he was not in favor of doing a  dedicated breast MRI. Dr. Claudia Desanctis wants to go ahead with a flap closure I did it. I have not reviewed his note but per the patient this is a latissimus dorsi myocutaneous flap or I think a skin flap from her abdomen. I have talked to the patient I think it is time to attempt this. If I can continue hyperbarics surrounding the flap then we will attempt to do this 6/14; patient using Prisma as the primary dressing here. She has completed hyperbarics and did very well. Her wound is responded nicely. The depth of this and the condition of the granulation tissue looks a lot better. She has undermining areas at 2 and 10:00 both of these seem to be better as well. She is scheduled for flap closure on 7/11 by Dr. Claudia Desanctis 6/29; the patient continues to use Prisma wet-to-dry. Her graft is scheduled for 7/11 by Dr. Claudia Desanctis. We are looking at getting her additional hyperbaric treatment which I think might help with the likelihood of graft survival in this difficult irradiated area 7/21; patient returns to clinic to reinitiate HBO after her surgical procedure by Dr. Claudia Desanctis. On 07/15/2020 she had debridement of left chest wound and a rotational flap closure. She also had excision of right excess axillary tissue. Pathology did not show any malignancy and follow-up Dr. Claudia Desanctis saw the patient yesterday. He noted a 2 cm area of wound dehiscence. Recommended she return for hyperbaric oxygen and she returns today for her first treatment. Follow-up with him in 2 weeks. We were not actually going to see the patient today her appointment to be evaluated was Monday however she was concerned about a "fishy odor". She is also noted some drainage. Not specifically dressing in the wound area but covering with Adaptic 7/25; I am seeing this wound and follow-up. So far her culture is polymicrobial not growing any specific anything specific nevertheless with the Augmentin and the silver alginate her odor is a lot better. Not as much periwound  tenderness. Dimensions measuring 2.7 x 3.3 x 1.6. She has undermining from 10-1 o'clock of about 1 and half centimeters. Necrotic fat on the surface of this... 08/13/2020 upon evaluation today patient appears to be doing decently well in regard to her wound all things considered. Obviously this is much more open than what was hoped for based on the fact that she had a flap performed which was supposed to completely close this wound. Nonetheless currently she tells me that she is not really having any significant pain fortunately the drainage is improved compared to last time there is still some evidence of odor as far as the drainage is concerned but is definitely not as purulent as what was previously noted. With that being said I do believe that there are quite a few sutures which are quite embedded. Nonetheless I believe this needs to be removed. In fact if they are not I am afraid this is again actually grow into the skin and that is can to be even bigger issue. Some of them even so at this point appeared that there could be difficult to even get out. 8/17; this is the first  time I am seeing this in about 3 weeks. The patient had stop by my office yesterday to discuss after completing hyperbaric therapy. Based on what she said I was expecting the worst today however apparently with dressing changes and perhaps debridement last week the surface of the wound actually looks better than what I was expecting. There is considerable depth although the last time I saw this the base of the wound was necrotic there is still some necrotic tissue on the surface of the wound bed however in general the rest the tissue looks healthy even with some bleeding. She has been using cleaning the wound with Dakin's and then applying Prisma and then backing Dakin's wet-to-dry. This seems to work to clean the wound surface up. They also ordered a wound VAC and I discussed this with her in depth today She is not systemically  unwell. She is not running a fever there is no odor. She talks about pain over the lower part of the flap to touch. This does not appear to be related to movement of her shoulder. 8/23; the patient has been using the wound VAC for 1 week. Already a remarkable improvement and she appears to be tolerating this well we had Santyl under the wound VAC however the surface looks so clean today I am doubtful that is going to be necessary She is also being treated with HBO. This is helped her dramatically. Apparently her current certification will be up on 8/31. She is only had 19 dives and we will certainly need to recertify her up for this. Since I saw her last week I put in a secure text message to Dr. Claudia Desanctis through epic to let him know that we were going ahead with a wound VAC. He did not have any objections to this. He is offering her more surgery however in the eventuality that this does not close 8/30; the patient missed 2 treatments of HBO last week because of nausea. I think she is now on Compazine which seems to have helped. Otherwise she is tolerating HBO well. We are collagen under wound VAC. Direct depth of wound measured at 1.2 cm which I think is probably unchanged however the undermining area is down to 0.5 which I think is an improvement 9/6; patient continues with chemotherapy although she says she is troll of her nausea. We are using collagen with a wound VAC on the remaining wound on her chest. No major improvements in the undermining area from 12-5 currently measuring 0.5 cm. This is no better than last week Electronic Signature(s) Signed: 09/10/2020 5:13:37 PM By: [name] MD Entered By: [name] on 09/10/2020 10:44:26 -------------------------------------------------------------------------------- Physical Exam Details Patient Name: Date of Service: [name], Sarah V [name] 09/10/2020 9:00 A M Medical Record Number: [id number] Patient Account Number: [id number] Date of  Birth/Sex: Treating RN: October 07, 1970 (50 y.o. [name], [name] Primary Care Provider: [name] Other Clinician: Referring Provider: Treating Provider/Extender: [name] in Treatment: 22 Constitutional Sitting or standing Blood Pressure is within target range for patient.. Pulse regular and within target range for patient.Marland Kitchen Respirations regular, non-labored and within target range.. Temperature is normal and within the target range for the patient.Marland Kitchen Appears in no distress. Notes Wound exam; the upper surgical lesion close proximity to the anterior axillary line. Debridement of adherent slough from the wound bed gently done with a #5 curette. Really disappointing part here is the lack of improvement in the undermining area from about  12-5 o'clock currently 8.5 cm. I was hoping that negative pressure would stimulate closure of this area but at least an hour we are not seeing it. Electronic Signature(s) Signed: 09/10/2020 5:13:37 PM By: [name] MD Entered By: [name] on 09/10/2020 10:45:25 -------------------------------------------------------------------------------- Physician Orders Details Patient Name: Date of Service: Sarah Crochet V [name] 09/10/2020 9:00 A M Medical Record Number: [id number] Patient Account Number: [id number] Date of Birth/Sex: Treating RN: 02-Apr-1970 (50 y.o. 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 - Tuesday with Dr. Dellia Nims Return A Nurse Visit: - Vac dressing change Friday Bathing/ Shower/ Hygiene May shower with protection but do not get wound dressing(s) wet. Negative Presssure Wound Therapy Wound Vac to wound continuously at 189m/hg pressure Black Foam - Apply fibracol to wound base and undermiing under wound vac Wound Treatment Wound #1 - Chest Wound Laterality: Left Cleanser: Wound Cleanser 2 x Per Week/15 Days Discharge Instructions: Cleanse the wound with wound cleanser prior to applying a clean dressing using gauze sponges, not tissue or cotton balls. Prim Dressing: FIBRACOL Plus Dressing, 2x2 in (collagen) 2 x Per Week/15 Days ary Discharge Instructions: Moisten collagen with saline or hydrogel Electronic Signature(s) Signed: 09/10/2020 5:13:37 PM By: RLinton HamMD Signed: 09/10/2020 5:42:35 PM By: LLevan HurstRN, BSN Entered By: LLevan Hurston 09/10/2020 10:11:25 -------------------------------------------------------------------------------- Problem List Details Patient Name: Date of Service: ARoyetta CrochetV [name] 09/10/2020 9:00 A M Medical Record Number: [id number] Account Number: [id number] of Birth/Sex: Treating RN: 605/14/1972(50y.o. 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 09/10/2020 9:00 A M Medical Record Number: [id number] Account Number: [id number] of Birth/Sex: Treating RN: [birth year](50y.o. FTonita Phoenix [name] Primary Care Provider: JKarle PlumberOther Clinician: Referring Provider: Treating Provider/Extender: RNyra Marketin Treatment: 22 Subjective History of Present Illness (HPI) After canADMISSION 04/09/2020 This is a 50year old woman who is history of breast cancer in the left breast goes back to 2018. He was found to have a mass in her left breast. She underwent a left back lumpectomy and then subsequent chemo that she did not tolerate very well. She had seed implants placed but then underwent external beam a radiation in the spring 2019. 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 She had a BRCA 2 variant  of uncertain significance. Likely pathogenic. Her adjunctive radiation was from 03/11/2017 through 04/21/2017. Unfortunately she had a left breast biopsy on 11/24/2019 and it was again positive for an invasive ductal carcinoma grade 3 he is to resume at receptor 80% positive. HER-2 negative progesterone negative. She opted for bilateral mastectomies which were done on 01/09/2020 on the right with no evidence of malignancy on the left stage IIb invasive ductal carcinoma. She again received chemotherapy but she did not tolerate it because of wound dehiscence and low platelet counts according to the patient. Unfortunately her mastectomy wound dehisced on the left breast. She was taken back to the OR by [name] of general surgery on 33/24/22 and  she underwent a debridement and subsequent attempt at closure. She had been previously treated with Augmentin for suspected underlying infection. Once again the wound has dehisced in the mid aspect. It is roughly 1 cm deep there is tunnels both medially and laterally she. She still has sutures in place. Still describes constant pain drainage. She also has a drain still in place that drains about 10 cc/day. Currently she is changing ABD pad several times a day 4/12; patient went back to see [name] who remove most of the stitches and debrided her wound. She has been using silver alginate culture I did last time which was a PCR culture showed Peptostreptococcus, Enterococcus faecalis and Staphylococcus epidermidis which is of questionable significance. She has a larger wound now that the sutures have been removed. There are undermining areas superior medial superior lateral and a deep tunneling area in the inferior part of the wound. There is no obvious purulence. I started her on Augmentin on 04/12/2020 and I will likely extend this for another week AMAZINGLY; she is already been approved for HBO and will start today. [name] Echocardiogram which I reviewed from 2019 showed a normal ejection fraction.. I went over side effects of HBO with the patient and answered any of her questions. Her husband was present 5/3; its been a while since have seen this wound. She went to see [name] about 2-1/2 weeks ago according to the patient he can remove some of the surrounding debris. She will see him again later this week or early next week. She has been using silver alginate. A lot of this is problematic since she has Medicaid which traditionally does not cover wound care supplies although it traditionally does not cover HBO either Her wound actually looks quite a bit better than the last time I saw this better looking surface healthy looking tissue. She has tunnels at 810 2 in 6 the  deepest of these is at 2 at 2.2 cm there is no exposed bone. 5/10; the patient was coming in for nurse visit today but requested that I see the wound because of surface discoloration. She states the Iodoflex makes the surface of this turned "white". She is not really complaining of undue tenderness. She is changing this every second day. The goal of this had been to get the surface of the wound to a vibrant red color and then potentially look at changing to a silver collagen-based dressing, Puraply, and then perhaps an alternative skin substituteo Derma vest. The patient tells me she has an appoint with her oncologist [name] on the 19th. She apparently think she is going to get another PET scan. Based on this I would like his comment about whether there is possibility of any malignancy in the wound itself or  just underneath it. Up to now we have not really thought that was true 5/17; nice improvement in the chest wound. Less depth. We have been using Iodoflex to clean off the wound surface this seems to have done nicely. Patient went to see [name] 2 days ago who has referred her to see Dr. Claudia Desanctis. I certainly agree with the thought the surface of the wound looks a lot better now and I think consideration of plastic surgery is certainly something the patient should discuss. I have talked to the patient about this today and I certainly support a conversation with Dr. Claudia Desanctis. I also wondered about is there any residual cancer in this. [name] apparently did not seem to think so per the patient although I do not see that in the text conversations we have had. I also wonder about a skin substitute if her version of Medicare [[employer]] would cover this. [name] is apparently considering starting her on [place]. Patient is already very scared about the side effects but she will discuss this with [name] on 5/19 5/31; patient's wound about the same in terms of size she still has the  tunneling area to the left. Surface of the wound looks a lot better there has been some filling in the granulation I have been included on serial text between Dr. Callie Fielding surgery] Dr. Ruffin Frederick oncology] and Dr. Para Skeans surgery]. I think there is not a lot of thought that the patient has recurrence of her cancer her MRI was indeterminant but [name] did not feel that there was any reason to think she has a major recurrence and he was not in favor of doing a dedicated breast MRI. Dr. Claudia Desanctis wants to go ahead with a flap closure I did it. I have not reviewed his note but per the patient this is a latissimus dorsi myocutaneous flap or I think a skin flap from her abdomen. I have talked to the patient I think it is time to attempt this. If I can continue hyperbarics surrounding the flap then we will attempt to do this 6/14; patient using Prisma as the primary dressing here. She has completed hyperbarics and did very well. Her wound is responded nicely. The depth of this and the condition of the granulation tissue looks a lot better. She has undermining areas at 2 and 10:00 both of these seem to be better as well. She is scheduled for flap closure on 7/11 by Dr. Claudia Desanctis 6/29; the patient continues to use Prisma wet-to-dry. Her graft is scheduled for 7/11 by Dr. Claudia Desanctis. We are looking at getting her additional hyperbaric treatment which I think might help with the likelihood of graft survival in this difficult irradiated area 7/21; patient returns to clinic to reinitiate HBO after her surgical procedure by Dr. Claudia Desanctis. On 07/15/2020 she had debridement of left chest wound and a rotational flap closure. She also had excision of right excess axillary tissue. Pathology did not show any malignancy and follow-up Dr. Claudia Desanctis saw the patient yesterday. He noted a 2 cm area of wound dehiscence. Recommended she return for hyperbaric oxygen and she returns today for her first treatment. Follow-up with  him in 2 weeks. We were not actually going to see the patient today her appointment to be evaluated was Monday however she was concerned about a "fishy odor". She is also noted some drainage. Not specifically dressing in the wound area but covering with Adaptic 7/25; I am seeing this wound and follow-up.  So far her culture is polymicrobial not growing any specific anything specific nevertheless with the Augmentin and the silver alginate her odor is a lot better. Not as much periwound tenderness. Dimensions measuring 2.7 x 3.3 x 1.6. She has undermining from 10-1 o'clock of about 1 and half centimeters. Necrotic fat on the surface of this... 08/13/2020 upon evaluation today patient appears to be doing decently well in regard to her wound all things considered. Obviously this is much more open than what was hoped for based on the fact that she had a flap performed which was supposed to completely close this wound. Nonetheless currently she tells me that she is not really having any significant pain fortunately the drainage is improved compared to last time there is still some evidence of odor as far as the drainage is concerned but is definitely not as purulent as what was previously noted. With that being said I do believe that there are quite a few sutures which are quite embedded. Nonetheless I believe this needs to be removed. In fact if they are not I am afraid this is again actually grow into the skin and that is can to be even bigger issue. Some of them even so at this point appeared that there could be difficult to even get out. 8/17; this is the first time I am seeing this in about 3 weeks. The patient had stop by my office yesterday to discuss after completing hyperbaric therapy. Based on what she said I was expecting the worst today however apparently with dressing changes and perhaps debridement last week the surface of the wound actually looks better than what I was expecting. There is  considerable depth although the last time I saw this the base of the wound was necrotic there is still some necrotic tissue on the surface of the wound bed however in general the rest the tissue looks healthy even with some bleeding. She has been using cleaning the wound with Dakin's and then applying Prisma and then backing Dakin's wet-to-dry. This seems to work to clean the wound surface up. They also ordered a wound VAC and I discussed this with her in depth today She is not systemically unwell. She is not running a fever there is no odor. She talks about pain over the lower part of the flap to touch. This does not appear to be related to movement of her shoulder. 8/23; the patient has been using the wound VAC for 1 week. Already a remarkable improvement and she appears to be tolerating this well we had Santyl under the wound VAC however the surface looks so clean today I am doubtful that is going to be necessary She is also being treated with HBO. This is helped her dramatically. Apparently her current certification will be up on 8/31. She is only had 19 dives and we will certainly need to recertify her up for this. Since I saw her last week I put in a secure text message to Dr. Claudia Desanctis through epic to let him know that we were going ahead with a wound VAC. He did not have any objections to this. He is offering her more surgery however in the eventuality that this does not close 8/30; the patient missed 2 treatments of HBO last week because of nausea. I think she is now on Compazine which seems to have helped. Otherwise she is tolerating HBO well. We are collagen under wound VAC. Direct depth of wound measured at 1.2 cm which I think  is probably unchanged however the undermining area is down to 0.5 which I think is an improvement 9/6; patient continues with chemotherapy although she says she is troll of her nausea. We are using collagen with a wound VAC on the remaining wound on her chest. No major  improvements in the undermining area from 12-5 currently measuring 0.5 cm. This is no better than last week Objective Constitutional Sitting or standing Blood Pressure is within target range for patient.. Pulse regular and within target range for patient.Marland Kitchen Respirations regular, non-labored and within target range.. Temperature is normal and within the target range for the patient.Marland Kitchen Appears in no distress. Vitals Time Taken: 9:32 AM, Height: 67 in, Weight: 257 lbs, BMI: 40.2, Temperature: 98.2 F, Pulse: 77 bpm, Respiratory Rate: 18 breaths/min, Blood Pressure: 134/74 mmHg. General Notes: Wound exam; the upper surgical lesion close proximity to the anterior axillary line. Debridement of adherent slough from the wound bed gently done with a #5 curette. Really disappointing part here is the lack of improvement in the undermining area from about 12-5 o'clock currently 8.5 cm. I was hoping that negative pressure would stimulate closure of this area but at least an hour we are not seeing it. Integumentary (Hair, Skin) Wound #1 status is Open. Original cause of wound was Surgical Injury. The date acquired was: 01/16/2020. The wound has been in treatment 22 weeks. The wound is located on the Left Chest. The wound measures 2.8cm length x 3cm width x 1.3cm depth; 6.597cm^2 area and 8.577cm^3 volume. There is Fat Layer (Subcutaneous Tissue) exposed. There is no tunneling noted, however, there is undermining starting at 10:00 and ending at 2:00 with a maximum distance of 0.7cm. There is a medium amount of serosanguineous drainage noted. The wound margin is well defined and not attached to the wound base. There is large (67- 100%) red, pink granulation within the wound bed. There is a small (1-33%) amount of necrotic tissue within the wound bed including Adherent Slough. 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 There was a Excisional Skin/Subcutaneous Tissue Debridement with a total area of 8.4 sq cm performed by Ricard Dillon., MD. With the following instrument(s): Curette to remove Viable and Non-Viable tissue/material. Material removed includes Subcutaneous Tissue and Slough and. No specimens were taken. A time out was conducted at 10:05, prior to the start of the procedure. A Minimum amount of bleeding was controlled with Pressure. The procedure was tolerated well with a pain level of 0 throughout and a pain level of 0 following the procedure. Post Debridement Measurements: 2.8cm length x 3cm width x 1.3cm depth; 8.577cm^3 volume. Character of Wound/Ulcer Post Debridement is improved. Post procedure Diagnosis Wound #1: Same as Pre-Procedure Plan Follow-up Appointments: Return Appointment in 1 week. - Tuesday with Dr. Dellia Nims Nurse Visit: - Vac dressing change Friday Bathing/ Shower/ Hygiene: May shower with protection but do not get wound dressing(s) wet. Negative Presssure Wound Therapy: Wound Vac to wound continuously at 125m/hg pressure Black Foam - Apply fibracol to wound base and undermiing under wound vac WOUND #1: - Chest Wound Laterality: Left Cleanser: Wound Cleanser 2 x Per Week/15 Days Discharge Instructions: Cleanse the wound with wound cleanser prior to  applying a clean dressing using gauze sponges, not tissue or cotton balls. Prim Dressing: FIBRACOL Plus Dressing, 2x2 in (collagen) 2 x Per Week/15 Days ary Discharge Instructions: Moisten collagen with saline or hydrogel 1. I continued with hydrogel moistened silver collagen especially tucked into the undermining area still under the wound VAC for  at least another week or 2 2. The only other options would be an advanced treatment product which I am not sure we would have an easy time getting through even her more liberal version of Medicaid. Return for additional plastic surgery would be an option 3. She continues with HBO which is really done a nice job in terms of the condition of the granulation tissue Electronic Signature(s) Signed: 09/10/2020 5:13:37 PM By: [name] MD Entered By: [name] on 09/10/2020 10:48:17 -------------------------------------------------------------------------------- SuperBill Details Patient Name: Date of Service: Sarah Crochet V [name] 09/10/2020 Medical Record Number: [id number] Patient Account Number: [id number] Date of Birth/Sex: Treating RN: 1970-10-16 (50 y.o. 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 Facility Procedures CPT4 Code: 92119417 Description: 40814 - DEB SUBQ TISSUE 20 SQ CM/< ICD-10 Diagnosis Description L59.8 Other specified disorders of the skin and subcutaneous tissue related to radiati T81.31XD Disruption of external operation (surgical) wound, not elsewhere classified, sub Modifier: on sequent encounter Quantity: 1 Physician Procedures : CPT4 Code Description Modifier [id number] - WC PHYS SUBQ TISS 20 SQ CM ICD-10 Diagnosis Description L59.8 Other specified disorders of the skin and subcutaneous tissue related to radiation T81.31XD Disruption of external operation (surgical)  wound,  not elsewhere classified, subsequent encounter Quantity: 1 Electronic Signature(s) Signed: 09/19/2020 4:57:28 PM By: [name] MD Signed: 10/03/2020 4:49:48 PM By: Levan Hurst RN, BSN Previous Signature: 09/10/2020 5:13:37 PM Version By: [name] MD Previous Signature: 09/10/2020 5:42:35 PM Version By: Levan Hurst RN, BSN Entered By: Levan Hurst on 09/14/2020 11:40:31

## 2020-09-10 NOTE — Progress Notes (Signed)
IDONNA, KNOCHE ([id number]) Visit Report for 09/06/2020 SuperBill Details Patient Name: Date of Service: [name] 09/06/2020 Medical Record Number: [id number] Patient Account Number: [id number] Date of Birth/Sex: Treating RN: 03-08-1970 (50 y.o. 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

## 2020-09-10 NOTE — Progress Notes (Signed)
[name] ([id number]) Visit Report for 09/06/2020 HBO Details Patient Name: Date of Service: [name] [name] 09/06/2020 10:00 A M Medical Record Number: [id number] Patient Account Number: [id number] Date of Birth/Sex: Treating RN: 02-04-1970 (50 y.o. 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 HBO Treatment Details Treatment Number: 25 Patient Type: Outpatient Chamber Type: Monoplace Chamber Serial #: [id number] Treatment Protocol: 2.5 ATA with 90 minutes oxygen, with two 5 minute air breaks Treatment Details Compression Rate Down: 2.0 psi / minute De-Compression Rate Up: 2.5 psi / minute A breaks and breathing ir Compress Tx Pressure periods Decompress Decompress Begins Reached (leave unused spaces Begins Ends blank) Chamber Pressure (ATA 1 2.5 2.5 2.5 2.5 2.5 - - 2.5 1 ) Clock Time (24 hr) 08:53 09:05 09:35 09:40 10:10 10:15 - - 10:45 10:54 Treatment Length: 121 (minutes) Treatment Segments: 4 Vital Signs Capillary Blood Glucose Reference Range: 80 - 120 mg / dl HBO Diabetic Blood Glucose Intervention Range: <131 mg/dl or >249 mg/dl Time Vitals Blood Respiratory Capillary Blood Glucose Pulse Action Type: Pulse: Temperature: Taken: Pressure: Rate: Glucose (mg/dl): Meter #: Oximetry (%) Taken: Pre 08:25 128/86 86 18 97.9 Post 10:57 115/79 75 18 97.7 Treatment Response Treatment Toleration: Well Treatment Completion Status: Treatment Completed without Adverse Event Physician HBO Attestation: I certify that I supervised this HBO treatment in accordance with Medicare guidelines. A trained  emergency response team is readily available per Yes hospital policies and procedures. Continue HBOT as ordered. Yes Electronic Signature(s) Signed: 09/10/2020 8:43:02 AM By: Kalman Shan DO Previous Signature: 09/06/2020 4:28:07 PM Version By: [name] EMT Entered By: Kalman Shan on 09/10/2020 08:41:46 -------------------------------------------------------------------------------- HBO Safety Checklist Details Patient Name: Date of Service: Sarah Crochet V [name] 09/06/2020 10:00 A M Medical Record Number: [id number] Patient Account Number: [id number] Date of Birth/Sex: Treating RN: 02/09/1970 (50 y.o. [name] Primary Care Bethel Sirois: [name] Other Clinician: [name] Referring Ty Oshima: Treating Lorenza Shakir/Extender: [name] in Treatment: 21 HBO Safety Checklist Items Safety Checklist Consent Form Signed Patient voided / foley secured and emptied When did you last eato snack this AM Last dose of injectable or oral agent n/a Ostomy pouch emptied and vented if applicable NA All implantable devices assessed, documented and approved NA Intravenous access site secured and place NA Valuables secured Linens and cotton and cotton/polyester blend (less than 51% polyester) Personal oil-based products / skin lotions / body lotions removed Wigs or hairpieces removed NA Smoking or tobacco materials removed NA Books / newspapers / magazines / loose paper removed Cologne, aftershave, perfume and deodorant removed Jewelry removed (may wrap wedding band) Make-up removed Hair care products removed Battery operated devices (external) removed Heating patches and chemical warmers removed Titanium eyewear removed NA Nail polish cured greater than 10 hours Casting material cured greater than 10 hours NA Hearing aids removed NA Loose dentures or partials removed Prosthetics have been removed NA Patient demonstrates  correct use of air break device (if applicable) Patient concerns have been addressed Patient grounding bracelet on and cord attached to chamber Specifics for Inpatients (complete in addition to above) Medication sheet sent with patient NA Intravenous medications needed or due during therapy sent with  patient NA Drainage tubes (e.g. nasogastric tube or chest tube secured and vented) NA Endotracheal or Tracheotomy tube secured NA Cuff deflated of air and inflated with saline NA Airway suctioned NA Electronic Signature(s) Signed: 09/06/2020 4:26:36 PM By: [name] EMT Entered By: [name] on 09/06/2020 08:53:49

## 2020-09-11 ENCOUNTER — Encounter (HOSPITAL_BASED_OUTPATIENT_CLINIC_OR_DEPARTMENT_OTHER): Payer: Medicaid Other | Admitting: Physician Assistant

## 2020-09-11 NOTE — Progress Notes (Signed)
QUINNLYNN, PEEK ([id number]) Visit Report for 09/10/2020 SuperBill Details Patient Name: Date of Service: [name] 09/10/2020 Medical Record Number: [id number] Patient Account Number: [id number] Date of Birth/Sex: Treating RN: 05-18-1970 (50 y.o. 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 Facility Procedures CPT4 Code Description Modifier Quantity [id number]-(Facility Use Only) HBOT full body chamber, 36mn , 4 ICD-10 Diagnosis Description T81.31XD Disruption of external operation (surgical) wound, not elsewhere classified, subsequent encounter 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 Physician Procedures Quantity CPT4 Code Description Modifier [id number] PHYS HYPERBARIC OXYGEN THERAPY 1 ICD-10 Diagnosis Description T81.31XD Disruption of external operation (surgical) wound, not elsewhere classified, subsequent encounter 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

## 2020-09-11 NOTE — Progress Notes (Signed)
[name], [name] ([id number]) Visit Report for 09/10/2020 Arrival Information Details Patient Name: Date of Service: [name] 09/10/2020 10:00 A M Medical Record Number: [id number] Patient Account Number: [id number] Date of Birth/Sex: Treating RN: 11-26-1970 (50 y.o. [name], [name] Primary Care Shadeed Colberg: [name] Other Clinician: [name] Referring Cerissa Zeiger: Treating Shaney Deckman/Extender: [name] in Treatment: 46 Visit Information History Since Last Visit All ordered tests and consults were completed: Yes Patient Arrived: Ambulatory Added or deleted any medications: No Arrival Time: 09:30 Any new allergies or adverse reactions: No Accompanied By: self Had a fall or experienced change in No Transfer Assistance: None activities of daily living that may affect Patient Identification Verified: Yes risk of falls: Secondary Verification Process Completed: Yes Signs or symptoms of abuse/neglect since last visito No Patient Requires Transmission-Based Precautions: No Hospitalized since last visit: No Patient Has Alerts: No Implantable device outside of the clinic excluding No cellular tissue based products placed in the center since last visit: Pain Present Now: No Notes Patient had wound care encounter today. Electronic Signature(s) Signed: 09/11/2020 1:32:45 PM By: [name] EMT Entered By: [name] on 09/10/2020 10:44:15 -------------------------------------------------------------------------------- Encounter Discharge Information Details Patient Name: Date of Service: [name] 09/10/2020 10:00 A M Medical Record Number: [id number] Patient Account Number: [id number] Date of Birth/Sex: Treating RN: 1970-09-16 (50 y.o. [name], [name] Primary Care Luan Maberry: [name] Other Clinician: [name] Referring Per Beagley: Treating Alicianna Litchford/Extender: [name] in Treatment: 22 Encounter Discharge Information Items Discharge Condition: Stable Ambulatory Status: Ambulatory Discharge Destination: Home Transportation: Private Auto Accompanied By: self Schedule Follow-up Appointment: No Clinical Summary of Care: Electronic Signature(s) Signed: 09/11/2020 1:32:45 PM By: [name] EMT Entered By: [name] on 09/10/2020 12:52:14 -------------------------------------------------------------------------------- Vitals Details Patient Name: Date of Service: [name] 09/10/2020 10:00 A M Medical Record Number: [id number] Patient Account Number: [id number] Date of Birth/Sex: Treating RN: [birth year] (50 y.o. [name], [name] Primary Care Oluwadarasimi Redmon: [name] Other Clinician: [name] Referring Juron Vorhees: Treating Yoav Okane/Extender: [name] in Treatment: 22 Vital Signs Time Taken: 09:32 Temperature (F): 98.2 Height (in): 67 Pulse (bpm): 77 Weight (lbs): 257 Respiratory Rate (breaths/min): 18 Body Mass Index (BMI): 40.2 Blood Pressure (mmHg): 134/74 Reference Range: 80 - 120 mg / dl Electronic Signature(s) Signed: 09/11/2020 1:32:45 PM By: [name] EMT Entered By: [name] on 09/10/2020 10:43:51

## 2020-09-11 NOTE — Progress Notes (Signed)
[name], [name] ([id number]) Visit Report for 09/10/2020 HBO Details Patient Name: Date of Service: [name] [name] 09/10/2020 10:00 A M Medical Record Number: [id number] Patient Account Number: [id number] Date of Birth/Sex: Treating RN: [birth year] (50 y.o. 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 HBO Treatment Details Treatment Number: 26 Patient Type: Outpatient Chamber Type: Monoplace Chamber Serial #: [id number] Treatment Protocol: 2.5 ATA with 90 minutes oxygen, with two 5 minute air breaks Treatment Details Compression Rate Down: 2.5 psi / minute De-Compression Rate Up: 2.5 psi / minute A breaks and breathing ir Compress Tx Pressure periods Decompress Decompress Begins Reached (leave unused spaces Begins Ends blank) Chamber Pressure (ATA 1 2.5 2.5 2.5 2.5 2.5 - - 2.5 1 ) Clock Time (24 hr) 10:41 10:50 11:20 11:25 11:55 12:00 - - 12:30 12:39 Treatment Length: 118 (minutes) Treatment Segments: 4 Vital Signs Capillary Blood Glucose Reference Range: 80 - 120 mg / dl HBO Diabetic Blood Glucose Intervention Range: <131 mg/dl or >249 mg/dl Time Vitals Blood Respiratory Capillary Blood Glucose Pulse Action Type: Pulse: Temperature: Taken: Pressure: Rate: Glucose (mg/dl): Meter #: Oximetry (%) Taken: Pre 09:32 134/74 77 18 98.2 Post 12:40 114/80 71 18 97.5 Treatment Response Treatment Toleration: Well Treatment Completion Status: Treatment Completed without Adverse Event Ivelis Norgard Notes No concerns with treatment given Physician HBO Attestation: I certify that I supervised this HBO treatment in  accordance with Medicare guidelines. A trained emergency response team is readily available per Yes hospital policies and procedures. Continue HBOT as ordered. Yes Electronic Signature(s) Signed: 09/10/2020 5:13:37 PM By: [name] MD Entered By: [name] on 09/10/2020 17:12:22 -------------------------------------------------------------------------------- HBO Safety Checklist Details Patient Name: Date of Service: [name] [name] 09/10/2020 10:00 A M Medical Record Number: [id number] Patient Account Number: [id number] Date of Birth/Sex: Treating RN: [birth year] (50 y.o. [name], [name] Primary Care Ashanti Ratti: [name] Other Clinician: [name] Referring Tyaira Heward: Treating Makenzie Weisner/Extender: [name] in Treatment: 22 HBO Safety Checklist Items Safety Checklist Consent Form Signed Patient voided / foley secured and emptied When did you last eato snack this AM Last dose of injectable or oral agent n/a Ostomy pouch emptied and vented if applicable NA All implantable devices assessed, documented and approved NA Intravenous access site secured and place NA Valuables secured Linens and cotton and cotton/polyester blend (less than 51% polyester) Personal oil-based products / skin lotions / body lotions removed Wigs or hairpieces removed NA Smoking or tobacco materials removed NA Books / newspapers / magazines / loose paper removed Cologne, aftershave, perfume and deodorant removed Jewelry removed (may wrap wedding band) Make-up removed Hair care products removed Battery operated devices (external) removed Heating patches and chemical warmers removed Titanium eyewear removed NA Nail polish cured greater than 10 hours Casting material cured greater than 10 hours NA Hearing aids removed NA Loose dentures or partials removed Prosthetics have been removed NA Patient demonstrates correct use of air break  device (if applicable) Patient concerns have been addressed Patient grounding bracelet on and cord attached to chamber Specifics for Inpatients (complete in addition to above) Medication sheet sent with patient NA Intravenous medications needed or due during therapy sent with patient NA Drainage  tubes (e.g. nasogastric tube or chest tube secured and vented) NA Endotracheal or Tracheotomy tube secured NA Cuff deflated of air and inflated with saline NA Airway suctioned NA Notes Paper version used for data entry afterward. Electronic Signature(s) Signed: 09/11/2020 1:32:45 PM By: [name] EMT Entered By: [name] on 09/10/2020 12:51:20

## 2020-09-12 ENCOUNTER — Encounter (HOSPITAL_BASED_OUTPATIENT_CLINIC_OR_DEPARTMENT_OTHER): Payer: Medicaid Other | Admitting: Internal Medicine

## 2020-09-12 ENCOUNTER — Other Ambulatory Visit: Payer: Self-pay

## 2020-09-12 DIAGNOSIS — T8131XD Disruption of external operation (surgical) wound, not elsewhere classified, subsequent encounter: Secondary | ICD-10-CM | POA: Diagnosis not present

## 2020-09-12 NOTE — Progress Notes (Signed)
MILLI, DESMET ([id number]) Visit Report for 09/12/2020 SuperBill Details Patient Name: Date of Service: [name] 09/12/2020 Medical Record Number: [id number] Patient Account Number: [id number] Date of Birth/Sex: Treating RN: Aug 04, 1970 (50 y.o. 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 Facility Procedures CPT4 Code Description Modifier Quantity [id number]-(Facility Use Only) HBOT full body chamber, 43mn , 4 ICD-10 Diagnosis Description T81.31XD Disruption of external operation (surgical) wound, not elsewhere classified, subsequent encounter 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 Physician Procedures Quantity CPT4 Code Description Modifier [id number] PHYS HYPERBARIC OXYGEN THERAPY 1 ICD-10 Diagnosis Description T81.31XD Disruption of external operation (surgical) wound, not elsewhere classified, subsequent encounter 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

## 2020-09-12 NOTE — Progress Notes (Signed)
[name], COTTOM ([id number]) Visit Report for 09/12/2020 HBO Details Patient Name: Date of Service: [name] Lesia Khan [name] 09/12/2020 10:00 A M Medical Record Number: [id number] Patient Account Number: [id number] Date of Birth/Sex: Treating RN: 1970/07/20 (50 y.o. 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 HBO Treatment Details Treatment Number: 27 Patient Type: Outpatient Chamber Type: Monoplace Chamber Serial #: [id number] Treatment Protocol: 2.5 ATA with 90 minutes oxygen, with two 5 minute air breaks Treatment Details Compression Rate Down: 2.0 psi / minute De-Compression Rate Up: 2.0 psi / minute A breaks and breathing ir Compress Tx Pressure periods Decompress Decompress Begins Reached (leave unused spaces Begins Ends blank) Chamber Pressure (ATA 1 2.5 2.5 2.5 2.5 2.5 - - 2.5 1 ) Clock Time (24 hr) 10:07 10:19 10:49 10:54 11:24 11:29 - - 11:59 12:11 Treatment Length: 124 (minutes) Treatment Segments: 4 Vital Signs Capillary Blood Glucose Reference Range: 80 - 120 mg / dl HBO Diabetic Blood Glucose Intervention Range: <131 mg/dl or >249 mg/dl Time Vitals Blood Respiratory Capillary Blood Glucose Pulse Action Type: Pulse: Temperature: Taken: Pressure: Rate: Glucose (mg/dl): Meter #: Oximetry (%) Taken: Pre 10:08 119/69 79 18 98.1 Post 12:15 125/88 73 18 98 Treatment Response Treatment Toleration: Well Treatment Completion Status: Treatment Completed without Adverse Event Fae Blossom Notes No concerns with treatment given Physician HBO Attestation: I certify that I supervised this HBO treatment in  accordance with Medicare guidelines. A trained emergency response team is readily available per Yes hospital policies and procedures. Continue HBOT as ordered. Yes Electronic Signature(s) Signed: 09/12/2020 4:26:03 PM By: [name] MD Entered By: [name] on 09/12/2020 12:52:43 -------------------------------------------------------------------------------- HBO Safety Checklist Details Patient Name: Date of Service: [name] V [name] 09/12/2020 10:00 A M Medical Record Number: [id number] Patient Account Number: [id number] Date of Birth/Sex: Treating RN: [birth year] (50 y.o. [name], [name] Primary Care Milayna Rotenberg: [name] Other Clinician: [name] Referring Armarion Greek: Treating Kennadie Brenner/Extender: [name] in Treatment: 22 HBO Safety Checklist Items Safety Checklist Consent Form Signed Patient voided / foley secured and emptied When did you last eato snack this AM Last dose of injectable or oral agent n/a Ostomy pouch emptied and vented if applicable NA All implantable devices assessed, documented and approved NA Intravenous access site secured and place NA Valuables secured Linens and cotton and cotton/polyester blend (less than 51% polyester) Personal oil-based products / skin lotions / body lotions removed Wigs or hairpieces removed NA Smoking or tobacco materials removed NA Books / newspapers / magazines / loose paper removed Cologne, aftershave, perfume and deodorant removed Jewelry removed (may wrap wedding band) Make-up removed Hair care products removed Battery operated devices (external) removed Heating patches and chemical warmers removed Titanium eyewear removed NA Nail polish cured greater than 10 hours NA Casting material cured greater than 10 hours NA Hearing aids removed NA Loose dentures or partials removed NA Prosthetics have been removed NA Patient demonstrates correct use of air  break device (if applicable) Patient concerns have been addressed Patient grounding bracelet on and cord attached to chamber Specifics for Inpatients (complete in addition to above) Medication sheet sent with patient NA Intravenous medications needed or due during therapy sent with patient  NA Drainage tubes (e.g. nasogastric tube or chest tube secured and vented) NA Endotracheal or Tracheotomy tube secured NA Cuff deflated of air and inflated with saline NA Airway suctioned NA Notes Paper version of safety checklist used today. Data entered after start of treatment. Electronic Signature(s) Signed: 09/12/2020 12:54:38 PM By: [name] EMT Entered By: [name] on 09/12/2020 11:18:55

## 2020-09-12 NOTE — Progress Notes (Signed)
IRIDESSA, HARROW ([id number]) Visit Report for 09/10/2020 Arrival Information Details Patient Name: Date of Service: [name] 09/10/2020 9:00 A M Medical Record Number: [id number] Patient Account Number: [id number] Date of Birth/Sex: Treating RN: [birth year] (50 y.o. [name], [name] Primary Care Federica Allport: [name] Other Clinician: Referring Benjiman Sedgwick: Treating Brycelyn Gambino/Extender: [name] in Treatment: 58 Visit Information History Since Last Visit Added or deleted any medications: No Patient Arrived: Ambulatory Any new allergies or adverse reactions: No Arrival Time: 09:30 Had a fall or experienced change in No Accompanied By: self activities of daily living that may affect Transfer Assistance: None risk of falls: Patient Identification Verified: Yes Signs or symptoms of abuse/neglect since last visito No Secondary Verification Process Completed: Yes Hospitalized since last visit: No Patient Requires Transmission-Based Precautions: No Implantable device outside of the clinic excluding No Patient Has Alerts: No cellular tissue based products placed in the center since last visit: Has Dressing in Place as Prescribed: Yes Pain Present Now: No Electronic Signature(s) Signed: 09/12/2020 11:10:38 AM By: Sandre Kitty Entered By: Sandre Kitty on 09/10/2020 09:31:18 -------------------------------------------------------------------------------- Complex / Palliative Patient Assessment Details Patient Name: Date of Service: [name] Orinda Kenner, [name] 09/10/2020 9:00 A M Medical Record Number: [id number] Patient Account Number: [id number] Date of Birth/Sex: Treating RN: 07-23-1970 (50 y.o. [name] Primary Care Lynn Sissel: [name] Other Clinician: Referring Jazzmine Kleiman: Treating Franke Menter/Extender: [name] in Treatment: 22 Palliative Management Criteria Complex Wound Management  Criteria Patient has remarkable or complex co-morbidities requiring medications or treatments that extend wound healing times. Examples: Diabetes mellitus with chronic renal failure or end stage renal disease requiring dialysis Advanced or poorly controlled rheumatoid arthritis Diabetes mellitus and end stage chronic obstructive pulmonary disease Active cancer with current chemo- or radiation therapy Failed surgical flap, currently on chemotherapy Care Approach Wound Care Plan: Complex Wound Management Electronic Signature(s) Signed: 09/12/2020 4:26:03 PM By: [name] MD Signed: 09/12/2020 6:29:12 PM By: Levan Hurst RN, BSN Entered By: Levan Hurst on 09/12/2020 08:24:33 -------------------------------------------------------------------------------- Encounter Discharge Information Details Patient Name: Date of Service: [name] 09/10/2020 9:00 A M Medical Record Number: [id number] Patient Account Number: [id number] Date of Birth/Sex: Treating RN: 08/01/1970 (50 y.o. [name] Primary Care Virgilio Broadhead: [name] Other Clinician: Referring Copper Basnett: Treating Alden Bensinger/Extender: [name] in Treatment: 22 Encounter Discharge Information Items Post Procedure Vitals Discharge Condition: Stable Temperature (F): 98.2 Ambulatory Status: Ambulatory Pulse (bpm): 77 Discharge Destination: Home Respiratory Rate (breaths/min): 18 Transportation: Private Auto Blood Pressure (mmHg): 134/74 Accompanied By: alone Schedule Follow-up Appointment: Yes Clinical Summary of Care: Patient Declined Electronic Signature(s) Signed: 09/10/2020 5:42:35 PM By: Levan Hurst RN, BSN Entered By: Levan Hurst on 09/10/2020 11:56:42 -------------------------------------------------------------------------------- Multi Wound Chart Details Patient Name: Date of Service: [name] 09/10/2020 9:00 A M Medical Record Number:  [id number] Patient Account Number: [id number] Date of Birth/Sex: Treating RN: [birth year] (50 y.o. [name], [name] Primary Care Donte Kary: [name] Other Clinician: Referring Azeez Dunker: Treating Antwan Bribiesca/Extender: [name] in Treatment: 22 Vital Signs Height(in): 34 Pulse(bpm): 40 Weight(lbs): 793 Blood Pressure(mmHg): 134/74 Body Mass Index(BMI): 40 Temperature(F): 98.2 Respiratory Rate(breaths/min): 18 Photos: [N/A:N/A] Left Chest N/A N/A Wound Location: Surgical Injury N/A N/A Wounding Event: Open Surgical Wound N/A N/A Primary Etiology: Necrosis (Radiation) N/A N/A Secondary Etiology: Hypertension, Osteoarthritis, ReceivedN/A N/A Comorbid History: Chemotherapy, Received Radiation 01/16/2020 N/A N/A Date  Acquired: 22 N/A N/A Weeks of Treatment: Open N/A N/A Wound Status: 2.8x3x1.3 N/A N/A Measurements L x W x D (cm) 6.597 N/A N/A A (cm) : rea 8.577 N/A N/A Volume (cm) : -3.70% N/A N/A % Reduction in A rea: -22.60% N/A N/A % Reduction in Volume: 10 Starting Position 1 (o'clock): 2 Ending Position 1 (o'clock): 0.7 Maximum Distance 1 (cm): Yes N/A N/A Undermining: Full Thickness Without Exposed N/A N/A Classification: Support Structures Medium N/A N/A Exudate A mount: Serosanguineous N/A N/A Exudate Type: red, brown N/A N/A Exudate Color: Well defined, not attached N/A N/A Wound Margin: Large (67-100%) N/A N/A Granulation A mount: Red, Pink N/A N/A Granulation Quality: Small (1-33%) N/A N/A Necrotic A mount: Fat Layer (Subcutaneous Tissue): Yes N/A N/A Exposed Structures: Fascia: No Tendon: No Muscle: No Joint: No Bone: No Small (1-33%) N/A N/A Epithelialization: Debridement - Excisional N/A N/A Debridement: Pre-procedure Verification/Time Out 10:05 N/A N/A Taken: Subcutaneous, Slough N/A N/A Tissue Debrided: Skin/Subcutaneous Tissue N/A N/A Level: 8.4 N/A N/A Debridement A (sq  cm): rea Curette N/A N/A Instrument: Minimum N/A N/A Bleeding: Pressure N/A N/A Hemostasis A chieved: 0 N/A N/A Procedural Pain: 0 N/A N/A Post Procedural Pain: Procedure was tolerated well N/A N/A Debridement Treatment Response: 2.8x3x1.3 N/A N/A Post Debridement Measurements L x W x D (cm) 8.577 N/A N/A Post Debridement Volume: (cm) Debridement N/A N/A Procedures Performed: Negative Pressure Wound Therapy Maintenance (NPWT) Treatment Notes Electronic Signature(s) Signed: 09/10/2020 5:13:37 PM By: [name] MD Signed: 09/10/2020 5:22:11 PM By: Rhae Hammock RN Entered By: [name] on 09/10/2020 10:40:30 -------------------------------------------------------------------------------- Multi-Disciplinary Care Plan Details Patient Name: Date of Service: [name] 09/10/2020 9:00 A M Medical Record Number: [id number] Patient Account Number: [id number] Date of Birth/Sex: Treating RN: May 29, 1970 (50 y.o. [name] Primary Care Enya Bureau: [name] Other Clinician: Referring Tiyon Sanor: Treating Omir Cooprider/Extender: [name] in Treatment: 22 Active Inactive Wound/Skin Impairment Nursing Diagnoses: Impaired tissue integrity Knowledge deficit related to smoking impact on wound healing Knowledge deficit related to ulceration/compromised skin integrity Goals: Patient will demonstrate a reduced rate of smoking or cessation of smoking Date Initiated: 04/09/2020 Date Inactivated: 06/18/2020 Target Resolution Date: 06/28/2020 Goal Status: Met Patient will have a decrease in wound volume by X% from date: (specify in notes) Date Initiated: 04/09/2020 Date Inactivated: 07/03/2020 Target Resolution Date: 07/05/2020 Goal Status: Met Patient/caregiver will verbalize understanding of skin care regimen Date Initiated: 04/09/2020 Target Resolution Date: 10/04/2020 Goal Status: Active Ulcer/skin breakdown will have a volume  reduction of 30% by week 4 Date Initiated: 04/09/2020 Date Inactivated: 05/14/2020 Target Resolution Date: 05/11/2020 Unmet Reason: see wound Goal Status: Unmet meaurements. Interventions: Assess patient/caregiver ability to obtain necessary supplies Assess patient/caregiver ability to perform ulcer/skin care regimen upon admission and as needed Assess ulceration(s) every visit Provide education on smoking Notes: Electronic Signature(s) Signed: 09/10/2020 5:42:35 PM By: Levan Hurst RN, BSN Entered By: Levan Hurst on 09/10/2020 11:55:17 -------------------------------------------------------------------------------- Negative Pressure Wound Therapy Maintenance (NPWT) Details Patient Name: Date of Service: [name], [name] 09/10/2020 9:00 A M Medical Record Number: [id number] Patient Account Number: [id number] Date of Birth/Sex: Treating RN: [birth year] (50 y.o. [name] Primary Care Koda Routon: [name] Other Clinician: Referring Aveon Colquhoun: Treating Aralynn Brake/Extender: [name] in Treatment: 22 NPWT Maintenance Performed for: Wound #1 Left Chest Performed By: Levan Hurst, RN Type: VAC System Coverage Size (sq cm): 8.4 Pressure Type: Constant Pressure Setting: 125 mmHG Drain Type:  None Primary Contact: Other : collagen Sponge/Dressing Type: Foam, Black Date Initiated: 08/21/2020 Dressing Removed: No Quantity of Sponges/Gauze Removed: dressing removed by pt prior to visit Canister Changed: No Dressing Reapplied: Yes Quantity of Sponges/Gauze Inserted: 1 piece black foam inserted Respones T Treatment: o pt tolerated well Days On NPWT : 21 Post Procedure Diagnosis Same as Pre-procedure Electronic Signature(s) Signed: 09/10/2020 5:42:35 PM By: Levan Hurst RN, BSN Entered By: Levan Hurst on 09/10/2020 11:57:01 -------------------------------------------------------------------------------- Pain Assessment  Details Patient Name: Date of Service: [name] 09/10/2020 9:00 A M Medical Record Number: [id number] Patient Account Number: [id number] Date of Birth/Sex: Treating RN: 04/01/1970 (50 y.o. [name], [name] Primary Care Enisa Runyan: [name] Other Clinician: Referring Wiatt Mahabir: Treating Jayvan Mcshan/Extender: [name] in Treatment: 22 Active Problems Location of Pain Severity and Description of Pain Patient Has Paino No Site Locations Pain Management and Medication Current Pain Management: Electronic Signature(s) Signed: 09/10/2020 5:22:11 PM By: Rhae Hammock RN Signed: 09/12/2020 11:10:38 AM By: Sandre Kitty Entered By: Sandre Kitty on 09/10/2020 09:32:51 -------------------------------------------------------------------------------- Patient/Caregiver Education Details Patient Name: Date of Service: [name] 9/6/2022andnbsp9:00 A M Medical Record Number: [id number] Patient Account Number: [id number] Date of Birth/Gender: Treating RN: [birth year] (50 y.o. [name] Primary Care Physician: [name] Other Clinician: Referring Physician: Treating Physician/Extender: [name] in Treatment: 37 Education Assessment Education Provided To: Patient Education Topics Provided Wound/Skin Impairment: Methods: Explain/Verbal Responses: State content correctly [employer]) Signed: 09/10/2020 5:42:35 PM By: Levan Hurst RN, BSN Entered By: Levan Hurst on 09/10/2020 11:55:43 -------------------------------------------------------------------------------- Wound Assessment Details Patient Name: Date of Service: [name] 09/10/2020 9:00 A M Medical Record Number: [id number] Patient Account Number: [id number] Date of Birth/Sex: Treating RN: [birth year] (50 y.o. [name], [name] Primary Care Venice Marcucci: [name] Other  Clinician: Referring Catlin Aycock: Treating Chantal Worthey/Extender: [name] in Treatment: 22 Wound Status Wound Number: 1 Primary Open Surgical Wound Etiology: Wound Location: Left Chest Secondary Necrosis (Radiation) Wounding Event: Surgical Injury Etiology: Date Acquired: 01/16/2020 Wound Status: Open Weeks Of Treatment: 22 Comorbid Hypertension, Osteoarthritis, Received Chemotherapy, Clustered Wound: No History: Received Radiation Photos Wound Measurements Length: (cm) 2.8 Width: (cm) 3 Depth: (cm) 1.3 Area: (cm) 6.597 Volume: (cm) 8.577 % Reduction in Area: -3.7% % Reduction in Volume: -22.6% Epithelialization: Small (1-33%) Tunneling: No Undermining: Yes Starting Position (o'clock): 10 Ending Position (o'clock): 2 Maximum Distance: (cm) 0.7 Wound Description Classification: Full Thickness Without Exposed Support Structures Wound Margin: Well defined, not attached Exudate Amount: Medium Exudate Type: Serosanguineous Exudate Color: red, brown Foul Odor After Cleansing: No Slough/Fibrino Yes Wound Bed Granulation Amount: Large (67-100%) Exposed Structure Granulation Quality: Red, Pink Fascia Exposed: No Necrotic Amount: Small (1-33%) Fat Layer (Subcutaneous Tissue) Exposed: Yes Necrotic Quality: Adherent Slough Tendon Exposed: No Muscle Exposed: No Joint Exposed: No Bone Exposed: No Treatment Notes Wound #1 (Chest) Wound Laterality: Left Cleanser Wound Cleanser Discharge Instruction: Cleanse the wound with wound cleanser prior to applying a clean dressing using gauze sponges, not tissue or cotton balls. Peri-Wound Care Topical Primary Dressing FIBRACOL Plus Dressing, 2x2 in (collagen) Discharge Instruction: Moisten collagen with saline or hydrogel Secondary Dressing Secured With Compression Wrap Compression Stockings Add-Ons Electronic Signature(s) Signed: 09/10/2020 5:22:11 PM By: Rhae Hammock RN Signed: 09/10/2020  5:42:35 PM By: Levan Hurst RN, BSN Entered By: Levan Hurst on 09/10/2020 09:58:33 -------------------------------------------------------------------------------- [place] Details Patient Name: Date of Service: [name]  L, [name] 09/10/2020 9:00 A M Medical Record Number: [id number] Patient Account Number: [id number] Date of Birth/Sex: Treating RN: 12/23/1970 (50 y.o. [name], [name] Primary Care Jadene Stemmer: [name] Other Clinician: Referring Holton Sidman: Treating Adysson Revelle/Extender: [name] in Treatment: 22 Vital Signs Time Taken: 09:32 Temperature (F): 98.2 Height (in): 67 Pulse (bpm): 77 Weight (lbs): 257 Respiratory Rate (breaths/min): 18 Body Mass Index (BMI): 40.2 Blood Pressure (mmHg): 134/74 Reference Range: 80 - 120 mg / dl Electronic Signature(s) Signed: 09/12/2020 11:10:38 AM By: Sandre Kitty Entered By: Sandre Kitty on 09/10/2020 09:32:43

## 2020-09-12 NOTE — Progress Notes (Signed)
[name], [name] ([id number]) Visit Report for 09/12/2020 Arrival Information Details Patient Name: Date of Service: [name] [name] 09/12/2020 10:00 Sarah M Medical Record Number: [id number] Patient Account Number: [id number] Date of Birth/Sex: Treating RN: 1970-03-04 (50 y.o. [name], [name] Primary Care Josslin Sanjuan: [name] Other Clinician: [name] Referring Lucus Lambertson: Treating Delmy Holdren/Extender: [name] in Treatment: 76 Visit Information History Since Last Visit All ordered tests and consults were completed: Yes Patient Arrived: Ambulatory Added or deleted any medications: No Arrival Time: 09:45 Any new allergies or adverse reactions: No Accompanied By: self Had Sarah fall or experienced change in No Transfer Assistance: None activities of daily living that may affect Patient Identification Verified: Yes risk of falls: Secondary Verification Process Completed: Yes Signs or symptoms of abuse/neglect since last visito No Patient Requires Transmission-Based Precautions: No Hospitalized since last visit: No Patient Has Alerts: No Implantable device outside of the clinic excluding No cellular tissue based products placed in the center since last visit: Pain Present Now: No Electronic Signature(s) Signed: 09/12/2020 12:54:38 PM By: [name] EMT Entered By: [name] on 09/12/2020 10:08:27 -------------------------------------------------------------------------------- Encounter Discharge Information Details Patient Name: Date of Service: [name] V [name] 09/12/2020 10:00 Sarah M Medical Record Number: [id number] Patient Account Number: [id number] Date of Birth/Sex: Treating RN: [birth year] (50 y.o. [name] Primary Care Coyle Stordahl: [name] Other Clinician: [name] Referring Kyron Schlitt: Treating Reita Shindler/Extender: [name] in Treatment: 22 Encounter Discharge  Information Items Discharge Condition: Stable Ambulatory Status: Ambulatory Discharge Destination: Home Transportation: Private Auto Accompanied By: self Schedule Follow-up Appointment: No Clinical Summary of Care: Electronic Signature(s) Signed: 09/12/2020 12:54:38 PM By: [name] EMT Entered By: [name] on 09/12/2020 12:48:44 -------------------------------------------------------------------------------- Vitals Details Patient Name: Date of Service: [name] V [name] 09/12/2020 10:00 Sarah M Medical Record Number: [id number] Patient Account Number: [id number] Date of Birth/Sex: Treating RN: 1970-08-02 (50 y.o. [name], [name] Primary Care Sebert Stollings: [name] Other Clinician: [name] Referring Allissa Albright: Treating Yago Ludvigsen/Extender: [name] in Treatment: 22 Vital Signs Time Taken: 10:08 Temperature (F): 98.1 Height (in): 67 Pulse (bpm): 79 Weight (lbs): 257 Respiratory Rate (breaths/min): 18 Body Mass Index (BMI): 40.2 Blood Pressure (mmHg): 119/69 Reference Range: 80 - 120 mg / dl Electronic Signature(s) Signed: 09/12/2020 12:54:38 PM By: [name] EMT Entered By: [name] on 09/12/2020 10:09:33

## 2020-09-13 ENCOUNTER — Encounter (HOSPITAL_BASED_OUTPATIENT_CLINIC_OR_DEPARTMENT_OTHER): Payer: Medicaid Other | Admitting: Internal Medicine

## 2020-09-13 ENCOUNTER — Other Ambulatory Visit (HOSPITAL_COMMUNITY): Payer: Self-pay

## 2020-09-13 DIAGNOSIS — T8131XD Disruption of external operation (surgical) wound, not elsewhere classified, subsequent encounter: Secondary | ICD-10-CM

## 2020-09-13 DIAGNOSIS — D0512 Intraductal carcinoma in situ of left breast: Secondary | ICD-10-CM

## 2020-09-13 DIAGNOSIS — Z901 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: Secondary | ICD-10-CM | POA: Diagnosis not present

## 2020-09-13 DIAGNOSIS — L598 Other specified disorders of the skin and subcutaneous tissue related to radiation: Secondary | ICD-10-CM

## 2020-09-13 NOTE — Progress Notes (Signed)
[name], [name] ([id number]) Visit Report for 09/13/2020 Arrival Information Details Patient Name: Date of Service: [name] Sarah D. 09/13/2020 10:00 Sarah M Medical Record Number: [id number] Patient Account Number: [id number] Date of Birth/Sex: Treating RN: [name], [name] (50 y.o. [name], [name] Primary Care [name]: [name] Other Clinician: Donavan Khan Referring [name]: Treating Necola Bluestein/Extender: [name] in Treatment: 53 Visit Information History Since Last Visit All ordered tests and consults were completed: Yes Patient Arrived: Ambulatory Added or deleted any medications: No Arrival Time: [name] Any new allergies or adverse reactions: No Accompanied By: self Had Sarah fall or experienced change in No Transfer Assistance: None activities of daily living that Sarah affect Patient Identification Verified: Yes risk of falls: Secondary Verification Process Completed: Yes Signs or symptoms of abuse/neglect since last visito No Patient Requires Transmission-Based Precautions: No Hospitalized since last visit: No Patient Has Alerts: No Implantable device outside of the clinic excluding No cellular tissue based products placed in the center since last visit: Pain Present Now: No Electronic Signature(s) Signed: 09/13/2020 12:35:13 PM By: [name] EMT Entered By: [name] on 09/13/2020 09:58:01 -------------------------------------------------------------------------------- Encounter Discharge Information Details Patient Name: Date of Service: [name] Sarah D. 09/13/2020 10:00 Sarah M Medical Record Number: [id number] Patient Account Number: [id number] Date of Birth/Sex: Treating RN: [name] (50 y.o. [name] Primary Care Mitesh Rosendahl: [name] Other Clinician: Donavan Khan Referring Marilla Boddy: Treating Jacquelyn Antony/Extender: [name] in Treatment: 22 Encounter  Discharge Information Items Discharge Condition: Stable Ambulatory Status: Ambulatory Discharge Destination: Home Transportation: Private Auto Accompanied By: self Schedule Follow-up Appointment: No Clinical Summary of Care: Electronic Signature(s) Signed: 09/13/2020 12:35:13 PM By: [name] EMT Entered By: [name] on 09/13/2020 12:22:07 -------------------------------------------------------------------------------- Vitals Details Patient Name: Date of Service: [name] Sarah D. 09/13/2020 10:00 Sarah M Medical Record Number: [id number] Patient Account Number: [id number] Date of Birth/Sex: Treating RN: [name] (50 y.o. [name] Primary Care Rosey Eide: [name] Other Clinician: Donavan Khan Referring Lauri Purdum: Treating Minka Knight/Extender: [name] in Treatment: 22 Vital Signs Time Taken: 09:07 Temperature (F): 98.3 Height (in): 67 Pulse (bpm): 88 Weight (lbs): 257 Respiratory Rate (breaths/min): 16 Body Mass Index (BMI): 40.2 Blood Pressure (mmHg): 110/81 Reference Range: 80 - 120 mg / dl Electronic Signature(s) Signed: 09/13/2020 12:35:13 PM By: [name] EMT Entered By: [name] on 09/13/2020 09:58:32

## 2020-09-16 ENCOUNTER — Encounter (HOSPITAL_BASED_OUTPATIENT_CLINIC_OR_DEPARTMENT_OTHER): Payer: Medicaid Other | Admitting: Internal Medicine

## 2020-09-16 ENCOUNTER — Other Ambulatory Visit: Payer: Self-pay

## 2020-09-16 DIAGNOSIS — L598 Other specified disorders of the skin and subcutaneous tissue related to radiation: Secondary | ICD-10-CM

## 2020-09-16 DIAGNOSIS — T8131XD Disruption of external operation (surgical) wound, not elsewhere classified, subsequent encounter: Secondary | ICD-10-CM

## 2020-09-16 DIAGNOSIS — Z901 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: Secondary | ICD-10-CM | POA: Diagnosis not present

## 2020-09-16 DIAGNOSIS — D0512 Intraductal carcinoma in situ of left breast: Secondary | ICD-10-CM

## 2020-09-16 NOTE — Progress Notes (Signed)
DEJANAE, NEYRA ([id number]) Visit Report for 09/13/2020 SuperBill Details Patient Name: Date of Service: [name] 09/13/2020 Medical Record Number: [id number] Patient Account Number: [id number] Date of Birth/Sex: Treating RN: 1970/09/05 (50 y.o. 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 Facility Procedures CPT4 Code Description Modifier Quantity [id number]-(Facility Use Only) HBOT full body chamber, 38mn , 4 ICD-10 Diagnosis Description T81.31XD Disruption of external operation (surgical) wound, not elsewhere classified, subsequent encounter 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 Physician Procedures Quantity CPT4 Code Description Modifier [id number] PHYS HYPERBARIC OXYGEN THERAPY 1 ICD-10 Diagnosis Description T81.31XD Disruption of external operation (surgical) wound, not elsewhere classified, subsequent encounter 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

## 2020-09-16 NOTE — Progress Notes (Signed)
[name], [name] ([id number]) Visit Report for 09/13/2020 HBO Details Patient Name: Date of Service: [name]. 09/13/2020 10:00 A M Medical Record Number: [id number] Patient Account Number: [id number] Date of Birth/Sex: Treating RN: [birth year] (50 y.o. 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 HBO Treatment Details Treatment Number: 28 Patient Type: Outpatient Chamber Type: Monoplace Chamber Serial #: [id number] Treatment Protocol: 2.5 ATA with 90 minutes oxygen, with two 5 minute air breaks Treatment Details Compression Rate Down: 2.0 psi / minute De-Compression Rate Up: 2.5 psi / minute A breaks and breathing ir Compress Tx Pressure periods Decompress Decompress Begins Reached (leave unused spaces Begins Ends blank) Chamber Pressure (ATA 1 2.5 2.5 2.5 2.5 2.5 - - 2.5 1 ) Clock Time (24 hr) 09:52 10:03 10:33 10:38 11:08 11:13 - - 11:43 11:52 Treatment Length: 120 (minutes) Treatment Segments: 4 Vital Signs Capillary Blood Glucose Reference Range: 80 - 120 mg / dl HBO Diabetic Blood Glucose Intervention Range: <131 mg/dl or >249 mg/dl Time Vitals Blood Respiratory Capillary Blood Glucose Pulse Action Type: Pulse: Temperature: Taken: Pressure: Rate: Glucose (mg/dl): Meter #: Oximetry (%) Taken: Pre 09:07 110/81 88 16 98.3 Post 11:55 125/81 77 18 98 Treatment Response Treatment Toleration: Well Treatment Completion Status: Treatment Completed without Adverse Event Physician HBO Attestation: I certify that I supervised this HBO treatment in accordance with Medicare guidelines. A trained  emergency response team is readily available per Yes hospital policies and procedures. Continue HBOT as ordered. Yes Electronic Signature(s) Signed: 09/16/2020 4:48:12 PM By: Kalman Shan DO Previous Signature: 09/13/2020 12:35:13 PM Version By: [name] EMT Entered By: Kalman Shan on 09/16/2020 16:47:54 -------------------------------------------------------------------------------- HBO Safety Checklist Details Patient Name: Date of Service: [name]. 09/13/2020 10:00 A M Medical Record Number: [id number] Patient Account Number: [id number] Date of Birth/Sex: Treating RN: November 18, 1970 (50 y.o. [name] Primary Care Robert Sperl: [name] Other Clinician: [name] Referring Nykeria Mealing: Treating Yichen Gilardi/Extender: [name] in Treatment: 22 HBO Safety Checklist Items Safety Checklist Consent Form Signed Patient voided / foley secured and emptied When did you last eato snack this AM Last dose of injectable or oral agent n/a Ostomy pouch emptied and vented if applicable NA All implantable devices assessed, documented and approved NA Intravenous access site secured and place NA Valuables secured Linens and cotton and cotton/polyester blend (less than 51% polyester) Personal oil-based products / skin lotions / body lotions removed Wigs or hairpieces removed NA Smoking or tobacco materials removed NA Books / newspapers / magazines / loose paper removed Cologne, aftershave, perfume and deodorant removed Jewelry removed (may wrap wedding band) Make-up removed Hair care products removed Battery operated devices (external) removed Heating patches and chemical warmers removed Titanium eyewear removed NA Nail polish cured greater than 10 hours NA Casting material cured greater than 10 hours NA Hearing aids removed NA Loose dentures or partials removed Prosthetics have been removed NA Patient  demonstrates correct use of air break device (if applicable) Patient concerns have been addressed Patient grounding bracelet on and cord attached to chamber Specifics for Inpatients (complete in addition to above) Medication sheet sent with patient NA Intravenous medications needed or due during therapy sent  with patient NA Drainage tubes (e.g. nasogastric tube or chest tube secured and vented) NA Endotracheal or Tracheotomy tube secured NA Cuff deflated of air and inflated with saline NA Airway suctioned NA Electronic Signature(s) Signed: 09/13/2020 12:35:13 PM By: [name] EMT Entered By: [name] on 09/13/2020 12:19:47

## 2020-09-17 ENCOUNTER — Other Ambulatory Visit (HOSPITAL_COMMUNITY): Payer: Self-pay

## 2020-09-17 ENCOUNTER — Encounter (HOSPITAL_BASED_OUTPATIENT_CLINIC_OR_DEPARTMENT_OTHER): Payer: Medicaid Other | Admitting: Internal Medicine

## 2020-09-17 DIAGNOSIS — T8131XD Disruption of external operation (surgical) wound, not elsewhere classified, subsequent encounter: Secondary | ICD-10-CM | POA: Diagnosis not present

## 2020-09-17 NOTE — Progress Notes (Signed)
LAKEICHA, BRUER ([id number]) Visit Report for 09/13/2020 Arrival Information Details Patient Name: Date of Service: Sarah [name] [name] 09/13/2020 9:00 Sarah M Medical Record Number: [id number] Patient Account Number: [id number] Date of Birth/Sex: Treating RN: 07-Apr-1970 (50 y.o. [name] Primary Care Marley Pakula: [name] Other Clinician: Referring [place] Gopal: Treating Romie Keeble/Extender: [name] in Treatment: 63 Visit Information History Since Last Visit Added or deleted any medications: No Patient Arrived: Ambulatory Any new allergies or adverse reactions: No Arrival Time: 09:10 Had Sarah fall or experienced change in No Accompanied By: alone activities of daily living that may affect Transfer Assistance: None risk of falls: Patient Identification Verified: Yes Signs or symptoms of abuse/neglect since last visito No Secondary Verification Process Completed: Yes Hospitalized since last visit: No Patient Requires Transmission-Based Precautions: No Implantable device outside of the clinic excluding No Patient Has Alerts: No cellular tissue based products placed in the center since last visit: Has Dressing in Place as Prescribed: Yes Pain Present Now: No Electronic Signature(s) Signed: 09/17/2020 5:46:57 PM By: Levan Hurst RN, BSN Entered By: Levan Hurst on 09/13/2020 09:34:04 -------------------------------------------------------------------------------- Encounter Discharge Information Details Patient Name: Date of Service: Sarah Crochet V [name] 09/13/2020 9:00 Sarah M Medical Record Number: [id number] Patient Account Number: [id number] Date of Birth/Sex: Treating RN: 1970/02/27 (50 y.o. [name] Primary Care Davon Folta: [name] Other Clinician: Referring Yon Schiffman: Treating Peggie Hornak/Extender: [name] in Treatment: 22 Encounter Discharge Information Items Discharge Condition:  Stable Ambulatory Status: Ambulatory Discharge Destination: Home Transportation: Private Auto Accompanied By: alone Schedule Follow-up Appointment: Yes Clinical Summary of Care: Patient Declined Electronic Signature(s) Signed: 09/17/2020 5:46:57 PM By: Levan Hurst RN, BSN Entered By: Levan Hurst on 09/13/2020 09:36:39 -------------------------------------------------------------------------------- Negative Pressure Wound Therapy Maintenance (NPWT) Details Patient Name: Date of Service: Sarah BDELA Sarah L, [name] 09/13/2020 9:00 Sarah M Medical Record Number: [id number] Patient Account Number: [id number] Date of Birth/Sex: Treating RN: 25-Feb-1970 (50 y.o. [name] Primary Care Levie Wages: [name] Other Clinician: Referring Kagan Hietpas: Treating Hollye Pritt/Extender: [name] in Treatment: 22 NPWT Maintenance Performed for: Wound #1 Left Chest Performed By: Levan Hurst, RN Type: VAC System Coverage Size (sq cm): 8.4 Pressure Type: Constant Pressure Setting: 125 mmHG Drain Type: None Primary Contact: Other : collagen Sponge/Dressing Type: Foam, Blue Date Initiated: 08/21/2020 Dressing Removed: Yes Quantity of Sponges/Gauze Removed: 1 piece black foam removed Canister Changed: Yes Dressing Reapplied: Yes Quantity of Sponges/Gauze Inserted: 1 piece black foam inserted Respones T Treatment: o pt tolerated well Days On NPWT : 24 Electronic Signature(s) Signed: 09/17/2020 5:46:57 PM By: Levan Hurst RN, BSN Entered By: Levan Hurst on 09/13/2020 09:36:07 -------------------------------------------------------------------------------- Wound Assessment Details Patient Name: Date of Service: Sarah Crochet V [name] 09/13/2020 9:00 Sarah M Medical Record Number: [id number] Patient Account Number: [id number] Date of Birth/Sex: Treating RN: 1970-10-21 (50 y.o. [name] Primary Care Gwendolyn Nishi: [name] Other  Clinician: Referring Pardeep Pautz: Treating Everett Ricciardelli/Extender: [name] in Treatment: 22 Wound Status Wound Number: 1 Primary Open Surgical Wound Etiology: Wound Location: Left Chest Secondary Necrosis (Radiation) Wounding Event: Surgical Injury Etiology: Date Acquired: 01/16/2020 Wound Status: Open Weeks Of Treatment: 22 Comorbid Hypertension, Osteoarthritis, Received Chemotherapy, Clustered Wound: No History: Received Radiation Wound Measurements Length: (cm) 2.8 Width: (cm) 3 Depth: (cm) 1.3 Area: (cm) 6.597 Volume: (cm) 8.577 % Reduction in Area: -3.7% % Reduction in Volume: -22.6% Epithelialization: Small (1-33%) Tunneling:  No Undermining: Yes Starting Position (o'clock): 10 Ending Position (o'clock): 2 Maximum Distance: (cm) 0.7 Wound Description Classification: Full Thickness Without Exposed Support Structures Wound Margin: Well defined, not attached Exudate Amount: Medium Exudate Type: Serosanguineous Exudate Color: red, brown Foul Odor After Cleansing: No Slough/Fibrino Yes Wound Bed Granulation Amount: Large (67-100%) Exposed Structure Granulation Quality: Red, Pink Fascia Exposed: No Necrotic Amount: Small (1-33%) Fat Layer (Subcutaneous Tissue) Exposed: Yes Necrotic Quality: Adherent Slough Tendon Exposed: No Muscle Exposed: No Joint Exposed: No Bone Exposed: No Electronic Signature(s) Signed: 09/17/2020 5:46:57 PM By: Levan Hurst RN, BSN Entered By: Levan Hurst on 09/13/2020 09:35:23 -------------------------------------------------------------------------------- Vitals Details Patient Name: Date of Service: Sarah Crochet V [name] 09/13/2020 9:00 Sarah M Medical Record Number: [id number] Patient Account Number: [id number] Date of Birth/Sex: Treating RN: [birth year] (50 y.o. [name] Primary Care Perl Kerney: [name] Other Clinician: Referring Sanai Frick: Treating Brezlyn Manrique/Extender: [name] in Treatment: 22 Vital Signs Time Taken: 09:10 Temperature (F): 98.3 Height (in): 67 Pulse (bpm): 88 Weight (lbs): 257 Respiratory Rate (breaths/min): 16 Body Mass Index (BMI): 40.2 Blood Pressure (mmHg): 110/81 Reference Range: 80 - 120 mg / dl Electronic Signature(s) Signed: 09/17/2020 5:46:57 PM By: Levan Hurst RN, BSN Entered By: Levan Hurst on 09/13/2020 09:34:21

## 2020-09-17 NOTE — Progress Notes (Signed)
[name], [name] ([id number]) Visit Report for 09/13/2020 SuperBill Details Patient Name: Date of Service: [name]. 09/13/2020 Medical Record Number: [id number] Patient Account Number: [id number] Date of Birth/Sex: Treating RN: [birth year] (50 y.o. 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

## 2020-09-18 ENCOUNTER — Encounter (HOSPITAL_BASED_OUTPATIENT_CLINIC_OR_DEPARTMENT_OTHER): Payer: Medicaid Other | Admitting: Physician Assistant

## 2020-09-18 NOTE — Progress Notes (Signed)
DISTINY, ZEGARELLI ([id number]) Visit Report for 09/16/2020 Arrival Information Details Patient Name: Date of Service: [name] 09/16/2020 10:00 A M Medical Record Number: [id number] Patient Account Number: [id number] Date of Birth/Sex: Treating RN: [birth year] (50 y.o. [name] Primary Care Corrinna Karapetyan: [name] Other Clinician: [name] Referring Andrell Bergeson: Treating Lawana Hartzell/Extender: [name] in Treatment: 28 Visit Information History Since Last Visit All ordered tests and consults were completed: Yes Patient Arrived: Ambulatory Added or deleted any medications: No Arrival Time: 10:00 Any new allergies or adverse reactions: No Accompanied By: self Had a fall or experienced change in No Transfer Assistance: None activities of daily living that may affect Patient Identification Verified: Yes risk of falls: Secondary Verification Process Completed: Yes Signs or symptoms of abuse/neglect since last visito No Patient Requires Transmission-Based Precautions: No Hospitalized since last visit: No Patient Has Alerts: No Implantable device outside of the clinic excluding No cellular tissue based products placed in the center since last visit: Pain Present Now: No Electronic Signature(s) Signed: 09/18/2020 6:20:23 PM By: [name] EMT Entered By: [name] on 09/16/2020 14:23:07 -------------------------------------------------------------------------------- Encounter Discharge Information Details Patient Name: Date of Service: [name] 09/16/2020 10:00 A M Medical Record Number: [id number] Patient Account Number: [id number] Date of Birth/Sex: Treating RN: 1970-05-08 (50 y.o. [name] Primary Care Gianpaolo Mindel: [name] Other Clinician: [name] Referring Tyreik Delahoussaye: Treating Keiton Cosma/Extender: [name] in Treatment: 22 Encounter  Discharge Information Items Discharge Condition: Stable Ambulatory Status: Ambulatory Discharge Destination: Home Transportation: Private Auto Accompanied By: self Schedule Follow-up Appointment: No Clinical Summary of Care: Electronic Signature(s) Signed: 09/18/2020 6:20:23 PM By: [name] EMT Entered By: [name] on 09/16/2020 14:47:11 -------------------------------------------------------------------------------- Vitals Details Patient Name: Date of Service: [name] 09/16/2020 10:00 A M Medical Record Number: [id number] Patient Account Number: [id number] Date of Birth/Sex: Treating RN: July 03, 1970 (50 y.o. [name] Primary Care Riggins Cisek: [name] Other Clinician: [name] Referring Calyn Rubi: Treating Catalino Plascencia/Extender: [name] in Treatment: 22 Vital Signs Time Taken: 10:01 Temperature (F): 97.9 Height (in): 67 Pulse (bpm): 85 Weight (lbs): 257 Respiratory Rate (breaths/min): 18 Body Mass Index (BMI): 40.2 Blood Pressure (mmHg): 123/93 Reference Range: 80 - 120 mg / dl Electronic Signature(s) Signed: 09/18/2020 6:20:23 PM By: [name] EMT Entered By: [name] on 09/16/2020 14:43:41

## 2020-09-18 NOTE — Progress Notes (Signed)
MITSY, OWEN ([id number]) Visit Report for 09/17/2020 Arrival Information Details Patient Name: Date of Service: [name] [name] 09/17/2020 9:00 Sarah M Medical Record Number: [id number] Patient Account Number: [id number] Date of Birth/Sex: Treating RN: [birth year] (50 y.o. [name], [name] Primary Care : [name] Other Clinician: Referring : Treating /Extender: [name] in Treatment: 23 Visit Information History Since Last Visit Added or deleted any medications: No Patient Arrived: Ambulatory Any new allergies or adverse reactions: No Arrival Time: 09:51 Had Sarah fall or experienced change in No Accompanied By: alone activities of daily living that may affect Transfer Assistance: None risk of falls: Patient Identification Verified: Yes Signs or symptoms of abuse/neglect since last visito No Secondary Verification Process Completed: Yes Hospitalized since last visit: No Patient Requires Transmission-Based Precautions: No Implantable device outside of the clinic excluding No Patient Has Alerts: No cellular tissue based products placed in the center since last visit: Has Dressing in Place as Prescribed: Yes Pain Present Now: No Electronic Signature(s) Signed: 09/17/2020 5:46:57 PM By: Levan Hurst RN, BSN Entered By: Levan Hurst on 09/17/2020 09:51:54 -------------------------------------------------------------------------------- Encounter Discharge Information Details Patient Name: Date of Service: Sarah Crochet V [name] 09/17/2020 9:00 Sarah M Medical Record Number: [id number] Patient Account Number: [id number] Date of Birth/Sex: Treating RN: [birth year] (50 y.o. [name] Primary Care : [name] Other Clinician: Referring : Treating /Extender: [name] in Treatment: 23 Encounter Discharge Information Items Post Procedure  Vitals Discharge Condition: Stable Temperature (F): 98.0 Ambulatory Status: Ambulatory Pulse (bpm): 81 Discharge Destination: Home Respiratory Rate (breaths/min): 16 Transportation: Private Auto Blood Pressure (mmHg): 120/78 Accompanied By: alone Schedule Follow-up Appointment: Yes Clinical Summary of Care: Patient Declined Electronic Signature(s) Signed: 09/17/2020 5:46:57 PM By: Levan Hurst RN, BSN Entered By: Levan Hurst on 09/17/2020 13:40:31 -------------------------------------------------------------------------------- Multi Wound Chart Details Patient Name: Date of Service: Sarah Crochet V [name] 09/17/2020 9:00 Sarah M Medical Record Number: [id number] Patient Account Number: [id number] Date of Birth/Sex: Treating RN: 21-Sep-1970 (50 y.o. 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)] [N/Sarah:N/Sarah] Secondary Etiology: [1:Hypertension, Osteoarthritis, ReceivedN/Sarah] Comorbid History: [1:Chemotherapy, Received Radiation 01/16/2020] [N/Sarah:N/Sarah] Date Acquired: [1:23] [N/Sarah:N/Sarah] Weeks of Treatment: [1:Open] [N/Sarah:N/Sarah] Wound Status: [1:2.7x3.1x1.1] [N/Sarah:N/Sarah] Measurements L x W x D (cm) [1:6.574] [N/Sarah:N/Sarah] Sarah (cm) : rea [1:7.231] [N/Sarah:N/Sarah] Volume (cm) : [1:-3.30%] [N/Sarah:N/Sarah] % Reduction in Sarah [1:rea: -3.30%] [N/Sarah:N/Sarah] % Reduction in Volume: [1:10] Starting Position 1 (o'clock): [1:4] Ending Position 1 (o'clock): [1:1] Maximum Distance 1 (cm): [1:Yes] [N/Sarah:N/Sarah] Undermining: [1:Full Thickness  Without Exposed] [N/Sarah:N/Sarah] Classification: [1:Support Structures Medium] [N/Sarah:N/Sarah] Exudate Sarah mount: [1:Serosanguineous] [N/Sarah:N/Sarah] Exudate Type: [1:red, brown] [N/Sarah:N/Sarah] Exudate Color: [1:Well defined, not attached] [N/Sarah:N/Sarah] Wound Margin: [1:Large (67-100%)] [N/Sarah:N/Sarah] Granulation Sarah mount: [1:Red, Pink] [N/Sarah:N/Sarah] Granulation Quality: [1:Small (1-33%)] [N/Sarah:N/Sarah] Necrotic Sarah mount: [1:Fat Layer (Subcutaneous Tissue): Yes N/Sarah] Exposed Structures: [1:Fascia: No Tendon: No Muscle: No Joint: No Bone: No Small (1-33%)] [N/Sarah:N/Sarah] Epithelialization: [1:Debridement - Excisional] [N/Sarah:N/Sarah] Debridement: Pre-procedure Verification/Time Out 09:56 [N/Sarah:N/Sarah] Taken: [1:Subcutaneous, Slough] [N/Sarah:N/Sarah] Tissue Debrided: [1:Skin/Subcutaneous Tissue] [N/Sarah:N/Sarah] Level: [1:8.37] [N/Sarah:N/Sarah] Debridement Sarah (sq cm): [1:rea Curette] [N/Sarah:N/Sarah] Instrument: [1:Minimum] [N/Sarah:N/Sarah] Bleeding: [1:Pressure] [  N/Sarah:N/Sarah] Hemostasis Sarah chieved: [1:0] [N/Sarah:N/Sarah] Procedural Pain: [1:0] [N/Sarah:N/Sarah] Post Procedural Pain: [1:Procedure was tolerated well] [N/Sarah:N/Sarah] Debridement Treatment Response: [1:2.7x3.1x1.1] [N/Sarah:N/Sarah] Post Debridement Measurements L x W x D (cm) [1:7.231] [N/Sarah:N/Sarah] Post Debridement Volume: (cm) [1:Debridement] [N/Sarah:N/Sarah] Procedures Performed: [1:Negative Pressure Wound Therapy Maintenance (NPWT)] Treatment Notes Electronic Signature(s) Signed: 09/17/2020 5:15:03 PM By: Rhae Hammock RN Signed: 09/18/2020 3:40:47 PM By: [name] MD Entered By: [name] on 09/17/2020 10:32:13 -------------------------------------------------------------------------------- Multi-Disciplinary Care Plan Details Patient Name: Date of Service: [name], Sarah V [name] 09/17/2020 9:00 Sarah M Medical Record Number: [id number] Patient Account Number: [id number] Date of Birth/Sex: Treating RN: November 10, 1970 (50 y.o. [name] Primary Care : [name] Other Clinician: Referring : Treating  /Extender: [name] in Treatment: 23 Active Inactive Wound/Skin Impairment Nursing Diagnoses: Impaired tissue integrity Knowledge deficit related to smoking impact on wound healing Knowledge deficit related to ulceration/compromised skin integrity Goals: Patient will demonstrate Sarah reduced rate of smoking or cessation of smoking Date Initiated: 04/09/2020 Date Inactivated: 06/18/2020 Target Resolution Date: 06/28/2020 Goal Status: Met Patient will have Sarah decrease in wound volume by X% from date: (specify in notes) Date Initiated: 04/09/2020 Date Inactivated: 07/03/2020 Target Resolution Date: 07/05/2020 Goal Status: Met Patient/caregiver will verbalize understanding of skin care regimen Date Initiated: 04/09/2020 Target Resolution Date: 10/04/2020 Goal Status: Active Ulcer/skin breakdown will have Sarah volume reduction of 30% by week 4 Date Initiated: 04/09/2020 Date Inactivated: 05/14/2020 Target Resolution Date: 05/11/2020 Unmet Reason: see wound Goal Status: Unmet meaurements. Interventions: Assess patient/caregiver ability to obtain necessary supplies Assess patient/caregiver ability to perform ulcer/skin care regimen upon admission and as needed Assess ulceration(s) every visit Provide education on smoking Notes: Electronic Signature(s) Signed: 09/17/2020 5:46:57 PM By: Levan Hurst RN, BSN Entered By: Levan Hurst on 09/17/2020 13:39:05 -------------------------------------------------------------------------------- Negative Pressure Wound Therapy Maintenance (NPWT) Details Patient Name: Date of Service: Sarah BDELA Sarah L, Sarah V [name] 09/17/2020 9:00 Sarah M Medical Record Number: [id number] Patient Account Number: [id number] Date of Birth/Sex: Treating RN: [birth year] (50 y.o. [name] Primary Care : [name] Other Clinician: Referring : Treating /Extender: [name] in Treatment:  23 NPWT Maintenance Performed for: Wound #1 Left Chest Performed By: Levan Hurst, RN Type: VAC System Coverage Size (sq cm): 8.37 Pressure Type: Constant Pressure Setting: 125 mmHG Drain Type: None Primary Contact: Other : collagen Sponge/Dressing Type: Foam, Black Date Initiated: 08/21/2020 Dressing Removed: Yes Quantity of Sponges/Gauze Removed: 1 piece black foam removed Canister Changed: No Dressing Reapplied: Yes Quantity of Sponges/Gauze Inserted: 1 piece black foam inserted Respones T Treatment: o pt tolerated well Days On NPWT : 28 Post Procedure Diagnosis Same as Pre-procedure Electronic Signature(s) Signed: 09/17/2020 5:46:57 PM By: Levan Hurst RN, BSN Entered By: Levan Hurst on 09/17/2020 10:00:51 -------------------------------------------------------------------------------- Pain Assessment Details Patient Name: Date of Service: [name], Sarah V [name] 09/17/2020 9:00 Sarah M Medical Record Number: [id number] Patient Account Number: [id number] Date of Birth/Sex: Treating RN: [birth year] (50 y.o. [name] Primary Care : [name] Other Clinician: Referring : Treating /Extender: [name] in Treatment: 23 Active Problems Location of Pain Severity and Description of Pain Patient Has Paino No Site Locations Pain Management and Medication Current Pain Management: Electronic Signature(s) Signed: 09/17/2020 5:46:57 PM By: Levan Hurst RN, BSN Entered By: Levan Hurst on 09/17/2020 09:52:21 -------------------------------------------------------------------------------- Patient/Caregiver Education Details Patient Name: Date of Service: Sarah BDELA Sarah L, Sarah V [name] 9/13/2022andnbsp9:00 Sarah  M Medical Record Number: [id number] Patient Account Number: [id number] Date of Birth/Gender: Treating RN: 1970/05/13 (50 y.o. [name] Primary Care Physician: [name] Other  Clinician: Referring Physician: Treating Physician/Extender: [name] in Treatment: 23 Education Assessment Education Provided To: Patient Education Topics Provided Wound/Skin Impairment: Methods: Explain/Verbal Responses: State content correctly [employer]) Signed: 09/17/2020 5:46:57 PM By: Levan Hurst RN, BSN Entered By: Levan Hurst on 09/17/2020 13:39:15 -------------------------------------------------------------------------------- Wound Assessment Details Patient Name: Date of Service: Sarah Crochet V [name] 09/17/2020 9:00 Sarah M Medical Record Number: [id number] Patient Account Number: [id number] Date of Birth/Sex: Treating RN: [birth year] (50 y.o. [name] Primary Care Tamla Winkels: [name] Other Clinician: Referring Hobie Kohles: Treating Aydden Cumpian/Extender: [name] in Treatment: 23 Wound Status Wound Number: 1 Primary Open Surgical Wound Etiology: Wound Location: Left Chest Secondary Necrosis (Radiation) Wounding Event: Surgical Injury Etiology: Date Acquired: 01/16/2020 Wound Status: Open Weeks Of Treatment: 23 Comorbid Hypertension, Osteoarthritis, Received Chemotherapy, Clustered Wound: No History: Received Radiation Photos Wound Measurements Length: (cm) 2.7 Width: (cm) 3.1 Depth: (cm) 1.1 Area: (cm) 6.574 Volume: (cm) 7.231 % Reduction in Area: -3.3% % Reduction in Volume: -3.3% Epithelialization: Small (1-33%) Tunneling: No Undermining: Yes Starting Position (o'clock): 10 Ending Position (o'clock): 4 Maximum Distance: (cm) 1 Wound Description Classification: Full Thickness Without Exposed Support Structures Wound Margin: Well defined, not attached Exudate Amount: Medium Exudate Type: Serosanguineous Exudate Color: red, brown Foul Odor After Cleansing: No Slough/Fibrino Yes Wound Bed Granulation Amount: Large (67-100%) Exposed Structure Granulation  Quality: Red, Pink Fascia Exposed: No Necrotic Amount: Small (1-33%) Fat Layer (Subcutaneous Tissue) Exposed: Yes Necrotic Quality: Adherent Slough Tendon Exposed: No Muscle Exposed: No Joint Exposed: No Bone Exposed: No Treatment Notes Wound #1 (Chest) Wound Laterality: Left Cleanser Wound Cleanser Discharge Instruction: Cleanse the wound with wound cleanser prior to applying Sarah clean dressing using gauze sponges, not tissue or cotton balls. Peri-Wound Care Topical Primary Dressing FIBRACOL Plus Dressing, 2x2 in (collagen) Discharge Instruction: Moisten collagen with saline or hydrogel Secondary Dressing Secured With Compression Wrap Compression Stockings Add-Ons Electronic Signature(s) Signed: 09/17/2020 5:46:57 PM By: Levan Hurst RN, BSN Entered By: Levan Hurst on 09/17/2020 11:31:54 -------------------------------------------------------------------------------- Vitals Details Patient Name: Date of Service: [name], Sarah V [name] 09/17/2020 9:00 Sarah M Medical Record Number: [id number] Patient Account Number: [id number] Date of Birth/Sex: Treating RN: 05-Apr-1970 (50 y.o. [name] Primary Care Loyda Costin: [name] Other Clinician: Referring Shamonique Battiste: Treating Jaala Bohle/Extender: [name] in Treatment: 23 Vital Signs Time Taken: 09:51 Temperature (F): 98.0 Height (in): 67 Pulse (bpm): 81 Weight (lbs): 257 Respiratory Rate (breaths/min): 16 Body Mass Index (BMI): 40.2 Blood Pressure (mmHg): 120/78 Reference Range: 80 - 120 mg / dl Electronic Signature(s) Signed: 09/17/2020 5:46:57 PM By: Levan Hurst RN, BSN Entered By: Levan Hurst on 09/17/2020 09:52:15

## 2020-09-18 NOTE — Progress Notes (Signed)
KIYOKO, RAMPTON ([id number]) Visit Report for 09/17/2020 Arrival Information Details Patient Name: Date of Service: [name] 09/17/2020 10:00 A M Medical Record Number: [id number] Patient Account Number: [id number] Date of Birth/Sex: Treating RN: Nov 26, 1970 (50 y.o. [name], [name] Primary Care Jymir Dunaj: [name] Other Clinician: [name] Referring Xolani Degracia: Treating Lyndon Chenoweth/Extender: [name] in Treatment: 23 Visit Information History Since Last Visit All ordered tests and consults were completed: Yes Patient Arrived: Ambulatory Added or deleted any medications: No Arrival Time: 10:20 Any new allergies or adverse reactions: No Accompanied By: self Had a fall or experienced change in No Transfer Assistance: None activities of daily living that may affect Patient Identification Verified: Yes risk of falls: Secondary Verification Process Completed: Yes Signs or symptoms of abuse/neglect since last visito No Patient Requires Transmission-Based Precautions: No Hospitalized since last visit: No Patient Has Alerts: No Implantable device outside of the clinic excluding No cellular tissue based products placed in the center since last visit: Pain Present Now: No Electronic Signature(s) Signed: 09/18/2020 6:20:23 PM By: [name] EMT Entered By: [name] on 09/17/2020 12:02:22 -------------------------------------------------------------------------------- Encounter Discharge Information Details Patient Name: Date of Service: [name] 09/17/2020 10:00 A M Medical Record Number: [id number] Patient Account Number: [id number] Date of Birth/Sex: Treating RN: 12/02/1970 (50 y.o. [name], [name] Primary Care Odeal Welden: [name] Other Clinician: [name] Referring Aksh Swart: Treating Omayra Tulloch/Extender: [name] in Treatment: 23 Encounter  Discharge Information Items Discharge Condition: Stable Ambulatory Status: Ambulatory Discharge Destination: Other (Note Required) Transportation: Private Auto Accompanied By: self Schedule Follow-up Appointment: No Clinical Summary of Care: Notes Patient has other appointments today Electronic Signature(s) Signed: 09/18/2020 6:20:23 PM By: [name] EMT Entered By: [name] on 09/17/2020 13:41:38 -------------------------------------------------------------------------------- Vitals Details Patient Name: Date of Service: [name] 09/17/2020 10:00 A M Medical Record Number: [id number] Patient Account Number: [id number] Date of Birth/Sex: Treating RN: 1970-12-27 (50 y.o. [name], [name] Primary Care Heidi Maclin: [name] Other Clinician: [name] Referring Nialah Saravia: Treating Tj Kitchings/Extender: [name] in Treatment: 23 Vital Signs Time Taken: 09:51 Temperature (F): 98 Height (in): 67 Pulse (bpm): 81 Weight (lbs): 257 Respiratory Rate (breaths/min): 16 Body Mass Index (BMI): 40.2 Blood Pressure (mmHg): 120/78 Reference Range: 80 - 120 mg / dl Electronic Signature(s) Signed: 09/18/2020 6:20:23 PM By: [name] EMT Entered By: [name] on 09/17/2020 12:03:00

## 2020-09-18 NOTE — Progress Notes (Signed)
[name], [name] ([id number]) Visit Report for 09/17/2020 Debridement Details Patient Name: Date of Service: Sarah BDELA [name] 09/17/2020 9:00 Sarah M Medical Record Number: [id number] Patient Account Number: [id number] Date of Birth/Sex: Treating RN: 07/04/1970 (50 y.o. [name], [name] Primary Care Provider: [name] Other Clinician: Referring Provider: Treating Provider/Extender: [name] in Treatment: 23 Debridement Performed for Assessment: Wound #1 Left Chest Performed By: Physician Ricard Dillon., MD Debridement Type: Debridement Level of Consciousness (Pre-procedure): Awake and Alert Pre-procedure Verification/Time Out Yes - 09:56 Taken: Start Time: 09:56 T Area Debrided (L x W): otal 2.7 (cm) x 3.1 (cm) = 8.37 (cm) Tissue and other material debrided: Viable, Non-Viable, Slough, Subcutaneous, Slough Level: Skin/Subcutaneous Tissue Debridement Description: Excisional Instrument: Curette Bleeding: Minimum Hemostasis Achieved: Pressure End Time: 09:58 Procedural Pain: 0 Post Procedural Pain: 0 Response to Treatment: Procedure was tolerated well Level of Consciousness (Post- Awake and Alert procedure): Post Debridement Measurements of Total Wound Length: (cm) 2.7 Width: (cm) 3.1 Depth: (cm) 1.1 Volume: (cm) 7.231 Character of Wound/Ulcer Post Debridement: Improved Post Procedure Diagnosis Same as Pre-procedure Electronic Signature(s) Signed: 09/17/2020 5:15:03 PM By: Rhae Hammock RN Signed: 09/18/2020 3:40:47 PM By: [name] MD Entered By: [name] on 09/17/2020 10:32:41 -------------------------------------------------------------------------------- HPI Details Patient Name: Date of Service: Sarah Leisure, [name] 09/17/2020 9:00 Sarah M Medical Record Number: [id number] Patient Account Number: [id number] Date of Birth/Sex: Treating RN: [birth year] (50 y.o. [name], [name] Primary Care Provider:  [name] Other Clinician: Referring Provider: Treating Provider/Extender: [name] in Treatment: 23 History of Present Illness HPI Description: After canADMISSION 04/09/2020 [name] 50 year old woman who is history of breast cancer in the left breast goes back to 2018. He was found to have Sarah mass in her left breast. [name] and then subsequent chemo that she did not tolerate very well. She had seed implants placed but then underwent external beam Sarah radiation in the spring 2019. 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 Her adjunctive radiation was from 03/11/2017 through 04/21/2017. Unfortunately she had Sarah left breast biopsy on 11/24/2019 and it was again positive for an invasive ductal carcinoma grade 3 he is to resume at receptor 80% positive. HER-2 negative progesterone negative. She opted for bilateral mastectomies which were done on 01/09/2020 on the right with no evidence of malignancy on the left stage IIb invasive ductal carcinoma. She again received chemotherapy but she did not tolerate it because of wound dehiscence and low platelet counts according to the patient. Unfortunately her mastectomy wound dehisced on the left breast. She was taken back to the OR by [name] of general surgery on 33/24/22 and she underwent Sarah debridement and subsequent attempt at closure. She had been previously treated with Augmentin for suspected underlying infection. Once again the wound has dehisced in the mid aspect. It is roughly 1 cm deep there is tunnels both medially and laterally she. She still has sutures in place. Still describes constant pain drainage. She also has Sarah drain still in place that drains about 10 cc/day. Currently she is changing ABD pad several times Sarah day 4/12; patient went back to see  [name] who remove most of the stitches and debrided her wound. She has been using silver alginate culture I did last time which  was Sarah PCR culture showed Peptostreptococcus, Enterococcus faecalis and Staphylococcus epidermidis which is of questionable significance. [name] that the sutures have been removed. There are undermining areas superior medial superior lateral and Sarah deep tunneling area in the inferior part of the wound. There is no obvious purulence. I started her on Augmentin on 04/12/2020 and I will likely extend this for another week AMAZINGLY; she is already been approved for HBO and will start today. [name] Echocardiogram which I reviewed from 2019 showed Sarah normal ejection fraction.. I went over side effects of HBO with the patient and answered any of her questions. Her husband was present 5/3; [name]. She went to see [name] about 2-1/2 weeks ago according to the patient he can remove some of the surrounding debris. She will see him again later this week or early next week. She has been using silver alginate. Sarah lot of this is problematic since she has Medicaid which traditionally does not cover wound care supplies although it traditionally does not cover HBO either Her wound actually looks quite Sarah bit better than the last time I saw this better looking surface healthy looking tissue. She has tunnels at 810 2 in 6 the deepest of these is at 2 at 2.2 cm there is no exposed bone. 5/10; the patient was coming in for nurse visit today but requested that I see the wound because of surface discoloration. She states the Iodoflex makes the surface of this turned "white". She is not really complaining of undue tenderness. She is changing this every second day. The goal of this had been to get the surface of the wound to Sarah vibrant red color and then potentially look at changing to Sarah silver collagen-based  dressing, Puraply, and then perhaps an alternative skin substituteo Derma vest. The patient tells me she has an appoint with her oncologist [name] on the 19th. She apparently think she is going to get another PET scan. Based on this I would like his comment about whether there is possibility of any malignancy in the wound itself or just underneath it. Up to now we have not really thought that was true 5/17; nice improvement in the chest wound. Less depth. We have been using Iodoflex to clean off the wound surface this seems to have done nicely. Patient went to see [name] 2 days ago who has referred her to see Dr. Claudia Desanctis. I certainly agree with the thought the surface of the wound looks Sarah lot better now and I think consideration of plastic surgery is certainly something the patient should discuss. I have talked to the patient about this today and I certainly support Sarah conversation with Dr. Claudia Desanctis. I also wondered about is there any residual cancer in this. [name] apparently did not seem to think so per the patient although I do not see that in the text conversations we have had. I also wonder about Sarah skin substitute if her version of Medicare [[employer]] would cover this. [name] is apparently considering starting her on [place]. Patient is already very scared about the side effects but she will discuss this with [name] on 5/19 5/31; patient's wound about the same in terms of size she still has the tunneling area to the left. Surface of the wound looks Sarah lot better there has been some filling in the granulation I have been included on serial text between  Dr. Callie Fielding surgery] Dr. Ruffin Frederick oncology] and Dr. Para Skeans surgery]. I think there is not Sarah lot of thought that the patient has recurrence of her cancer her MRI was indeterminant but [name] did not feel that there was any reason to think she has Sarah major recurrence and he was not in favor of doing  Sarah dedicated breast MRI. Dr. Claudia Desanctis wants to go ahead with Sarah flap closure I did it. I have not reviewed his note but per the patient [name] latissimus dorsi myocutaneous flap or I think Sarah skin flap from her abdomen. I have talked to the patient I think it is time to attempt this. If I can continue hyperbarics surrounding the flap then we will attempt to do this 6/14; patient using Prisma as the primary dressing here. She has completed hyperbarics and did very well. Her wound is responded nicely. The depth of this and the condition of the granulation tissue looks Sarah lot better. She has undermining areas at 2 and 10:00 both of these seem to be better as well. She is scheduled for flap closure on 7/11 by Dr. Claudia Desanctis 6/29; the patient continues to use Prisma wet-to-dry. Her graft is scheduled for 7/11 by Dr. Claudia Desanctis. We are looking at getting her additional hyperbaric treatment which I think might help with the likelihood of graft survival in this difficult irradiated area 7/21; patient returns to clinic to reinitiate HBO after her surgical procedure by Dr. Claudia Desanctis. On 07/15/2020 she had debridement of left chest wound and Sarah rotational flap closure. She also had excision of right excess axillary tissue. Pathology did not show any malignancy and follow-up Dr. Claudia Desanctis saw the patient yesterday. He noted Sarah 2 cm area of wound dehiscence. Recommended she return for hyperbaric oxygen and she returns today for her first treatment. Follow-up with him in 2 weeks. We were not actually going to see the patient today her appointment to be evaluated was Monday however she was concerned about Sarah "fishy odor". She is also noted some drainage. Not specifically dressing in the wound area but covering with Adaptic 7/25; I am seeing this wound and follow-up. So far her culture is polymicrobial not growing any specific anything specific nevertheless with the Augmentin and the silver alginate her odor is Sarah lot better. Not as much periwound  tenderness. Dimensions measuring 2.7 x 3.3 x 1.6. She has undermining from 10-1 o'clock of about 1 and half centimeters. Necrotic fat on the surface of this... 08/13/2020 upon evaluation today patient appears to be doing decently well in regard to her wound all things considered. Obviously this is much more open than what was hoped for based on the fact that she had Sarah flap performed which was supposed to completely close this wound. Nonetheless currently she tells me that she is not really having any significant pain fortunately the drainage is improved compared to last time there is still some evidence of odor as far as the drainage is concerned but is definitely not as purulent as what was previously noted. With that being said I do believe that there are quite Sarah few sutures which are quite embedded. Nonetheless I believe this needs to be removed. In fact if they are not I am afraid this is again actually grow into the skin and that is can to be even bigger issue. Some of them even so at this point appeared that there could be difficult to even get out. 8/17; this is the first  time I am seeing this in about 3 weeks. The patient had stop by my office yesterday to discuss after completing hyperbaric therapy. Based on what she said I was expecting the worst today however apparently with dressing changes and perhaps debridement last week the surface of the wound actually looks better than what I was expecting. There is considerable depth although the last time I saw this the base of the wound was necrotic there is still some necrotic tissue on the surface of the wound bed however in general the rest the tissue looks healthy even with some bleeding. She has been using cleaning the wound with Dakin's and then applying Prisma and then backing Dakin's wet-to-dry. This seems to work to clean the wound surface up. They also ordered Sarah wound VAC and I discussed this with her in depth today She is not systemically  unwell. She is not running Sarah fever there is no odor. She talks about pain over the lower part of the flap to touch. This does not appear to be related to movement of her shoulder. 8/23; the patient has been using the wound VAC for 1 week. Already Sarah remarkable improvement and she appears to be tolerating this well we had Santyl under the wound VAC however the surface looks so clean today I am doubtful that is going to be necessary She is also being treated with HBO. This is helped her dramatically. Apparently her current certification will be up on 8/31. She is only had 19 dives and we will certainly need to recertify her up for this. Since I saw her last week I put in Sarah secure text message to Dr. Claudia Desanctis through epic to let him know that we were going ahead with Sarah wound VAC. He did not have any objections to this. He is offering her more surgery however in the eventuality that this does not close 8/30; the patient missed 2 treatments of HBO last week because of nausea. I think she is now on Compazine which seems to have helped. Otherwise she is tolerating HBO well. We are collagen under wound VAC. Direct depth of wound measured at 1.2 cm which I think is probably unchanged however the undermining area is down to 0.5 which I think is an improvement 9/6; patient continues with chemotherapy although she says she is troll of her nausea. We are using collagen with Sarah wound VAC on the remaining wound on her chest. No major improvements in the undermining area from 12-5 currently measuring 0.5 cm. This is no better than last week 9/13; patient continuing with chemotherapy. She completes HBO today. We are using silver collagen under wound VAC. No major improvements however the undermining area seems somewhat better to me. Electronic Signature(s) Signed: 09/18/2020 3:40:47 PM By: [name] MD Entered By: [name] on 09/17/2020  10:33:30 -------------------------------------------------------------------------------- Physical Exam Details Patient Name: Date of Service: Sarah Leisure, [name] 09/17/2020 9:00 Sarah M Medical Record Number: [id number] Patient Account Number: [id number] Date of Birth/Sex: Treating RN: 03-21-1970 (50 y.o. [name], [name] Primary Care Provider: [name] Other Clinician: Referring Provider: Treating Provider/Extender: [name] in Treatment: 23 Constitutional Sitting or standing Blood Pressure is within target range for patient.. Pulse regular and within target range for patient.Marland Kitchen Respirations regular, non-labored and within target range.. Temperature is normal and within the target range for the patient.Marland Kitchen Appears in no distress. Notes Wound exam; the upper surgical lesion close proximity to the anterior axillary line.  Debridement of surface slough from the deepest areas of this wound. I think the undermining is improved. The surface granulation looks healthy except for the area I debrided. There was blush fluorescence under MolecuLight around the circumference I also tried to address this around the circumference of the wound and the subcutaneous tissue Electronic Signature(s) Signed: 09/18/2020 3:40:47 PM By: [name] MD Entered By: [name] on 09/17/2020 10:34:40 -------------------------------------------------------------------------------- Physician Orders Details Patient Name: Date of Service: Sarah Leisure, [name] 09/17/2020 9:00 Sarah M Medical Record Number: [id number] Patient Account Number: [id number] Date of Birth/Sex: Treating RN: Aug 13, 1970 (50 y.o. 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 - Tuesday with Dr. Dellia Nims Return Sarah Nurse Visit: - Vac dressing change Friday Bathing/ Shower/ Hygiene May shower with protection but do not get wound dressing(s) wet. Negative Presssure Wound Therapy Wound Vac to wound continuously at 126m/hg pressure Black Foam - Apply fibracol to wound base and undermiing under wound vac Wound Treatment Wound #1 - Chest Wound Laterality: Left Cleanser: Wound Cleanser 2 x Per Week/15 Days Discharge Instructions: Cleanse the wound with wound cleanser prior to applying Sarah clean dressing using gauze sponges, not tissue or cotton balls. Prim Dressing: FIBRACOL Plus Dressing, 2x2 in (collagen) 2 x Per Week/15 Days ary Discharge Instructions: Moisten collagen with saline or hydrogel Electronic Signature(s) Signed: 09/17/2020 5:46:57 PM By: LLevan HurstRN, BSN Signed: 09/18/2020 3:40:47 PM By: RLinton HamMD Entered By: LLevan Hurston 09/17/2020 09:58:23 -------------------------------------------------------------------------------- Problem List Details Patient Name: Date of Service: ARoyetta CrochetV [name] 09/17/2020 9:00 Sarah M Medical Record Number: [id number] Account Number: [id number] of Birth/Sex: Treating RN: [id number](50y.o. 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 09/17/2020 9:00 Sarah M Medical Record Number: [id number] Account Number: [id number] of Birth/Sex: Treating RN: 606-Dec-1972(50y.o. FTonita Phoenix [name] Primary Care Provider: JKarle PlumberOther Clinician: Referring Provider: Treating Provider/Extender: RNyra Marketin Treatment: 23 Subjective History of Present Illness (HPI) After canADMISSION 04/09/2020 [name] 50year old woman who is history of breast cancer in the left breast goes back to 2018. He was found to have Sarah mass in her left breast. [name] and then subsequent chemo that she did not tolerate very well. She had seed implants placed but then underwent external beam Sarah radiation in  the spring 2019. 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 Her adjunctive radiation was from 03/11/2017 through 04/21/2017. Unfortunately she had Sarah left breast biopsy on 11/24/2019 and it was again positive for an invasive ductal carcinoma grade 3 he is to resume at receptor 80% positive. HER-2 negative progesterone negative. She opted for bilateral mastectomies which were done on 01/09/2020 on the right with no evidence of malignancy on the left stage IIb invasive ductal carcinoma. She  again received chemotherapy but she did not tolerate it because of wound dehiscence and low platelet counts according to the patient. Unfortunately her mastectomy wound dehisced on the left breast. She was taken back to the OR by [name] of general surgery on 33/24/22 and she underwent Sarah debridement and subsequent attempt at closure. She had been previously treated with Augmentin for suspected underlying infection. Once again the wound has dehisced in the mid aspect. It is roughly 1 cm deep there is tunnels both medially and laterally she. She still has sutures in place. Still describes constant pain drainage. She also has Sarah drain still in place that drains about 10 cc/day. Currently she is changing ABD pad several times Sarah day 4/12; patient went back to see [name] who remove most of the stitches and debrided her wound. She has been using silver alginate culture I did last time which was Sarah PCR culture showed Peptostreptococcus, Enterococcus faecalis and Staphylococcus epidermidis which is of questionable significance. [name] that the sutures have been removed. There are undermining areas superior medial superior lateral and Sarah deep tunneling area in the inferior part of the wound. There is no obvious purulence. I started her on Augmentin on 04/12/2020 and I will likely extend this for another week AMAZINGLY; she is already been approved for HBO and will start today. [name] Echocardiogram which I reviewed from 2019 showed Sarah normal ejection fraction.. I went over side effects of HBO with the patient and answered any of her questions. Her husband was present 5/3; [name]. She went to see [name] about 2-1/2 weeks ago according to the patient he can remove some of the surrounding debris. She will see him again later this week or early next week. She has been using silver alginate. Sarah lot of this is problematic since  she has Medicaid which traditionally does not cover wound care supplies although it traditionally does not cover HBO either Her wound actually looks quite Sarah bit better than the last time I saw this better looking surface healthy looking tissue. She has tunnels at 810 2 in 6 the deepest of these is at 2 at 2.2 cm there is no exposed bone. 5/10; the patient was coming in for nurse visit today but requested that I see the wound because of surface discoloration. She states the Iodoflex makes the surface of this turned "white". She is not really complaining of undue tenderness. She is changing this every second day. The goal of this had been to get the surface of the wound to Sarah vibrant red color and then potentially look at changing to Sarah silver collagen-based dressing, Puraply, and then perhaps an alternative skin substituteo Derma vest. The patient tells me she has an appoint with her oncologist [name] on the 19th. She apparently think she is going  to get another PET scan. Based on this I would like his comment about whether there is possibility of any malignancy in the wound itself or just underneath it. Up to now we have not really thought that was true 5/17; nice improvement in the chest wound. Less depth. We have been using Iodoflex to clean off the wound surface this seems to have done nicely. Patient went to see [name] 2 days ago who has referred her to see Dr. Claudia Desanctis. I certainly agree with the thought the surface of the wound looks Sarah lot better now and I think consideration of plastic surgery is certainly something the patient should discuss. I have talked to the patient about this today and I certainly support Sarah conversation with Dr. Claudia Desanctis. I also wondered about is there any residual cancer in this. [name] apparently did not seem to think so per the patient although I do not see that in the text conversations we have had. I also wonder about Sarah skin substitute if her version of  Medicare [BCCP] would cover this. [name] is apparently considering starting her on [place]. Patient is already very scared about the side effects but she will discuss this with [name] on 5/19 5/31; patient's wound about the same in terms of size she still has the tunneling area to the left. Surface of the wound looks Sarah lot better there has been some filling in the granulation I have been included on serial text between Dr. Callie Fielding surgery] Dr. Ruffin Frederick oncology] and Dr. Para Skeans surgery]. I think there is not Sarah lot of thought that the patient has recurrence of her cancer her MRI was indeterminant but [name] did not feel that there was any reason to think she has Sarah major recurrence and he was not in favor of doing Sarah dedicated breast MRI. Dr. Claudia Desanctis wants to go ahead with Sarah flap closure I did it. I have not reviewed his note but per the patient [name] latissimus dorsi myocutaneous flap or I think Sarah skin flap from her abdomen. I have talked to the patient I think it is time to attempt this. If I can continue hyperbarics surrounding the flap then we will attempt to do this 6/14; patient using Prisma as the primary dressing here. She has completed hyperbarics and did very well. Her wound is responded nicely. The depth of this and the condition of the granulation tissue looks Sarah lot better. She has undermining areas at 2 and 10:00 both of these seem to be better as well. She is scheduled for flap closure on 7/11 by Dr. Claudia Desanctis 6/29; the patient continues to use Prisma wet-to-dry. Her graft is scheduled for 7/11 by Dr. Claudia Desanctis. We are looking at getting her additional hyperbaric treatment which I think might help with the likelihood of graft survival in this difficult irradiated area 7/21; patient returns to clinic to reinitiate HBO after her surgical procedure by Dr. Claudia Desanctis. On 07/15/2020 she had debridement of left chest wound and Sarah rotational flap closure. She also had  excision of right excess axillary tissue. Pathology did not show any malignancy and follow-up Dr. Claudia Desanctis saw the patient yesterday. He noted Sarah 2 cm area of wound dehiscence. Recommended she return for hyperbaric oxygen and she returns today for her first treatment. Follow-up with him in 2 weeks. We were not actually going to see the patient today her appointment to be evaluated was Monday however she was concerned about Sarah "fishy  odor". She is also noted some drainage. Not specifically dressing in the wound area but covering with Adaptic 7/25; I am seeing this wound and follow-up. So far her culture is polymicrobial not growing any specific anything specific nevertheless with the Augmentin and the silver alginate her odor is Sarah lot better. Not as much periwound tenderness. Dimensions measuring 2.7 x 3.3 x 1.6. She has undermining from 10-1 o'clock of about 1 and half centimeters. Necrotic fat on the surface of this... 08/13/2020 upon evaluation today patient appears to be doing decently well in regard to her wound all things considered. Obviously this is much more open than what was hoped for based on the fact that she had Sarah flap performed which was supposed to completely close this wound. Nonetheless currently she tells me that she is not really having any significant pain fortunately the drainage is improved compared to last time there is still some evidence of odor as far as the drainage is concerned but is definitely not as purulent as what was previously noted. With that being said I do believe that there are quite Sarah few sutures which are quite embedded. Nonetheless I believe this needs to be removed. In fact if they are not I am afraid this is again actually grow into the skin and that is can to be even bigger issue. Some of them even so at this point appeared that there could be difficult to even get out. 8/17; this is the first time I am seeing this in about 3 weeks. The patient had stop by my office  yesterday to discuss after completing hyperbaric therapy. Based on what she said I was expecting the worst today however apparently with dressing changes and perhaps debridement last week the surface of the wound actually looks better than what I was expecting. There is considerable depth although the last time I saw this the base of the wound was necrotic there is still some necrotic tissue on the surface of the wound bed however in general the rest the tissue looks healthy even with some bleeding. She has been using cleaning the wound with Dakin's and then applying Prisma and then backing Dakin's wet-to-dry. This seems to work to clean the wound surface up. They also ordered Sarah wound VAC and I discussed this with her in depth today She is not systemically unwell. She is not running Sarah fever there is no odor. She talks about pain over the lower part of the flap to touch. This does not appear to be related to movement of her shoulder. 8/23; the patient has been using the wound VAC for 1 week. Already Sarah remarkable improvement and she appears to be tolerating this well we had Santyl under the wound VAC however the surface looks so clean today I am doubtful that is going to be necessary She is also being treated with HBO. This is helped her dramatically. Apparently her current certification will be up on 8/31. She is only had 19 dives and we will certainly need to recertify her up for this. Since I saw her last week I put in Sarah secure text message to Dr. Claudia Desanctis through epic to let him know that we were going ahead with Sarah wound VAC. He did not have any objections to this. He is offering her more surgery however in the eventuality that this does not close 8/30; the patient missed 2 treatments of HBO last week because of nausea. I think she is now on Compazine which seems  to have helped. Otherwise she is tolerating HBO well. We are collagen under wound VAC. Direct depth of wound measured at 1.2 cm which I think  is probably unchanged however the undermining area is down to 0.5 which I think is an improvement 9/6; patient continues with chemotherapy although she says she is troll of her nausea. We are using collagen with Sarah wound VAC on the remaining wound on her chest. No major improvements in the undermining area from 12-5 currently measuring 0.5 cm. This is no better than last week 9/13; patient continuing with chemotherapy. She completes HBO today. We are using silver collagen under wound VAC. No major improvements however the undermining area seems somewhat better to me. Objective Constitutional Sitting or standing Blood Pressure is within target range for patient.. Pulse regular and within target range for patient.Marland Kitchen Respirations regular, non-labored and within target range.. Temperature is normal and within the target range for the patient.Marland Kitchen Appears in no distress. Vitals Time Taken: 9:51 AM, Height: 67 in, Weight: 257 lbs, BMI: 40.2, Temperature: 98.0 F, Pulse: 81 bpm, Respiratory Rate: 16 breaths/min, Blood Pressure: 120/78 mmHg. General Notes: Wound exam; the upper surgical lesion close proximity to the anterior axillary line. Debridement of surface slough from the deepest areas of this wound. I think the undermining is improved. The surface granulation looks healthy except for the area I debrided. There was blush fluorescence under MolecuLight around the circumference I also tried to address this around the circumference of the wound and the subcutaneous tissue Integumentary (Hair, Skin) Wound #1 status is Open. Original cause of wound was Surgical Injury. The date acquired was: 01/16/2020. The wound has been in treatment 23 weeks. The wound is located on the Left Chest. The wound measures 2.7cm length x 3.1cm width x 1.1cm depth; 6.574cm^2 area and 7.231cm^3 volume. There is Fat Layer (Subcutaneous Tissue) exposed. There is no tunneling noted, however, there is undermining starting at 10:00 and  ending at 4:00 with Sarah maximum distance of 1cm. There is Sarah medium amount of serosanguineous drainage noted. The wound margin is well defined and not attached to the wound base. There is large (67-100%) red, pink granulation within the wound bed. There is Sarah small (1-33%) amount of necrotic tissue within the wound bed including Adherent Slough. 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 There was Sarah Excisional Skin/Subcutaneous Tissue Debridement with Sarah total area of 8.37 sq cm performed by Ricard Dillon., MD. With the following instrument(s): Curette to remove Viable and Non-Viable tissue/material. Material removed includes Subcutaneous Tissue and Slough and. No specimens were taken. Sarah time out was conducted at 09:56, prior to the start of the procedure. Sarah Minimum amount of bleeding was controlled with Pressure. The procedure was tolerated well with Sarah pain level of 0 throughout and Sarah pain level of 0 following the procedure. Post Debridement Measurements: 2.7cm length x 3.1cm width x 1.1cm depth; 7.231cm^3 volume. Character of Wound/Ulcer Post Debridement is improved. Post procedure Diagnosis Wound #1: Same as Pre-Procedure Plan Follow-up Appointments: Return Appointment in 1 week. - Tuesday with Dr. Dellia Nims Nurse Visit: - Vac dressing change Friday Bathing/ Shower/ Hygiene: May shower with protection but do not get wound dressing(s)  wet. Negative Presssure Wound Therapy: Wound Vac to wound continuously at 177m/hg pressure Black Foam - Apply fibracol to wound base and undermiing under wound vac WOUND #1: - Chest Wound  Laterality: Left Cleanser: Wound Cleanser 2 x Per Week/15 Days Discharge Instructions: Cleanse the wound with wound cleanser prior to applying Sarah clean dressing using gauze sponges, not tissue or cotton balls. Prim Dressing: FIBRACOL Plus Dressing, 2x2 in (collagen) 2 x Per Week/15 Days ary Discharge Instructions: Moisten collagen with saline or hydrogel 1. We use MolecuLight to look at the wound in Sarah training capacity. This showed blush fluorescence around the circumference and white at the bottom of the wound which I think is slough. 2. Debridement as noted 3. Still using silver collagen under the wound VAC 4. Completing HBO today 5. I am going to run epi fix through her insurance although [name] version of Medicaid and I am not that hopeful that they will be approved. 6. I cannot rule out referring her back to plastic surgery Dr. PClaudia Desanctisand I talked to her about this today MolecuLight DX: 1st Scanned Wound The following wound was scanned with MolecuLight DX): chest Fluorescence bacterial imaging was medically necessary today due to Wound previously healing as expected but has recently stalled (lack of (Indication): area/volume >24% at 4 weeks) MolecuLight Results Yellow Colors The indicated colors were noted in the following area(s). In the periphery of the wound As Sarah result of todays scan, the following treatment plans were put in place. debridement Electronic Signature(s) Signed: 09/18/2020 3:40:47 PM By: RLinton HamMD Entered By: RLinton Hamon 09/17/2020 10:40:57 -------------------------------------------------------------------------------- SuperBill Details Patient Name: Date of Service: APecola Leisure [name] 09/17/2020 Medical Record Number: [id number] Account Number: [id number] of Birth/Sex: Treating RN: 6January 26, 1972(50y.o. 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 Facility Procedures CPT4 Code: [id number]: 109983- DEB SUBQ TISSUE 20 SQ CM/< ICD-10 Diagnosis Description T81.31XD Disruption of external operation (surgical) wound, not elsewhere classified, su L59.8 Other specified disorders of the skin and subcutaneous tissue related to radiat Modifier: bsequent encounter ion Quantity: 1 CPT4 Code: [id number]: 976734- WOUND VAC-50 SQ CM OR LESS Modifier: Quantity: 1 Physician Procedures : CPT4 Code Description Modifier [id number] 40973- WC PHYS SUBQ TISS 20 SQ CM ICD-10 Diagnosis Description T81.31XD Disruption of external operation (surgical) wound, not elsewhere classified, subsequent encounter L59.8 Other specified disorders of the  skin and subcutaneous tissue related to radiation Quantity: 1 Electronic Signature(s) Signed: 09/17/2020 5:46:57 PM By: LLevan HurstRN, BSN Signed: 09/18/2020 3:40:47 PM By: RLinton HamMD Entered By: LLevan Hurston 09/17/2020 13:39:29

## 2020-09-18 NOTE — Progress Notes (Signed)
[name], [name] ([id number]) Visit Report for 09/16/2020 SuperBill Details Patient Name: Date of Service: [name] 09/16/2020 Medical Record Number: [id number] Patient Account Number: [id number] Date of Birth/Sex: Treating RN: October 16, 1970 (50 y.o. 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 Facility Procedures CPT4 Code Description Modifier Quantity [id number] G0277-(Facility Use Only) HBOT full body chamber, 41mn , 4 ICD-10 Diagnosis Description T81.31XD Disruption of external operation (surgical) wound, not elsewhere classified, subsequent encounter 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 Physician Procedures Quantity CPT4 Code Description Modifier [id number] PHYS HYPERBARIC OXYGEN THERAPY 1 ICD-10 Diagnosis Description T81.31XD Disruption of external operation (surgical) wound, not elsewhere classified, subsequent encounter 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

## 2020-09-18 NOTE — Progress Notes (Signed)
ATHANASIA, KRIETE ([id number]) Visit Report for 09/16/2020 HBO Details Patient Name: Date of Service: Sarah BDELA [name] 09/16/2020 10:00 Sarah M Medical Record Number: [id number] Patient Account Number: [id number] Date of Birth/Sex: Treating RN: 01/13/1970 (50 y.o. 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 HBO Treatment Details Treatment Number: 29 Patient Type: Outpatient Chamber Type: Monoplace Chamber Serial #: [id number] Treatment Protocol: 2.5 ATA with 90 minutes oxygen, with two 5 minute air breaks Treatment Details Compression Rate Down: 2.5 psi / minute De-Compression Rate Up: 2.5 psi / minute Sarah breaks and breathing ir Compress Tx Pressure periods Decompress Decompress Begins Reached (leave unused spaces Begins Ends blank) Chamber Pressure (ATA 1 2.5 2.5 2.5 2.5 2.5 - - 2.5 1 ) Clock Time (24 hr) 10:10 10:19 10:49 10:54 11:24 11:29 - - 11:59 12:08 Treatment Length: 118 (minutes) Treatment Segments: 4 Vital Signs Capillary Blood Glucose Reference Range: 80 - 120 mg / dl HBO Diabetic Blood Glucose Intervention Range: <131 mg/dl or >249 mg/dl Time Vitals Blood Respiratory Capillary Blood Glucose Pulse Action Type: Pulse: Temperature: Taken: Pressure: Rate: Glucose (mg/dl): Meter #: Oximetry (%) Taken: Pre 10:01 123/93 85 18 97.9 Post 12:10 119/101 75 20 97.8 Treatment Response Treatment Toleration: Well Treatment Completion Status: Treatment Completed without Adverse Event Physician HBO Attestation: I certify that I supervised this HBO treatment in accordance with Medicare guidelines. Sarah trained  emergency response team is readily available per Yes hospital policies and procedures. Continue HBOT as ordered. Yes Electronic Signature(s) Signed: 09/16/2020 4:47:36 PM By: Kalman Shan DO Entered By: Kalman Shan on 09/16/2020 16:44:47 -------------------------------------------------------------------------------- HBO Safety Checklist Details Patient Name: Date of Service: Sarah [name] 09/16/2020 10:00 Sarah M Medical Record Number: [id number] Patient Account Number: [id number] Date of Birth/Sex: Treating RN: 1970/02/12 (50 y.o. [name] Primary Care Benford Asch: [name] Other Clinician: [name] Referring Billie Intriago: Treating Jasiah Elsen/Extender: [name] in Treatment: 22 HBO Safety Checklist Items Safety Checklist Consent Form Signed Patient voided / foley secured and emptied When did you last eato snack this AM Last dose of injectable or oral agent n/Sarah Ostomy pouch emptied and vented if applicable NA All implantable devices assessed, documented and approved NA Intravenous access site secured and place NA Valuables secured Linens and cotton and cotton/polyester blend (less than 51% polyester) Personal oil-based products / skin lotions / body lotions removed Wigs or hairpieces removed NA Smoking or tobacco materials removed NA Books / newspapers / magazines / loose paper removed Cologne, aftershave, perfume and deodorant removed Jewelry removed (may wrap wedding band) Make-up removed Hair care products removed Battery operated devices (external) removed Heating patches and chemical warmers removed Titanium eyewear removed NA Nail polish cured greater than 10 hours Casting material cured greater than 10 hours NA Hearing aids removed NA Loose dentures or partials removed Prosthetics have been removed NA Patient demonstrates correct use of air break device (if applicable) Patient concerns have  been addressed Patient grounding bracelet on and cord attached to chamber Specifics for Inpatients (complete in addition to above) Medication sheet sent with patient NA Intravenous medications needed or due during therapy sent with patient NA Drainage tubes (e.g. nasogastric tube or chest tube  secured and vented) NA Endotracheal or Tracheotomy tube secured NA Cuff deflated of air and inflated with saline NA Airway suctioned NA Notes Paper version used prior to treatment. Data entered afterward. Electronic Signature(s) Signed: 09/18/2020 6:20:23 PM By: [name] EMT Entered By: [name] on 09/16/2020 14:50:51

## 2020-09-18 NOTE — Progress Notes (Signed)
KATALEAH, TOPALIAN ([id number]) Visit Report for 09/17/2020 SuperBill Details Patient Name: Date of Service: [name] 09/17/2020 Medical Record Number: [id number] Patient Account Number: [id number] Date of Birth/Sex: Treating RN: Feb 13, 1970 (50 y.o. 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 Facility Procedures CPT4 Code Description Modifier Quantity [id number] G0277-(Facility Use Only) HBOT full body chamber, 16mn , 3 ICD-10 Diagnosis Description T81.31XD Disruption of external operation (surgical) wound, not elsewhere classified, subsequent encounter 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 Physician Procedures Quantity CPT4 Code Description Modifier [id number] PHYS HYPERBARIC OXYGEN THERAPY 1 ICD-10 Diagnosis Description T81.31XD Disruption of external operation (surgical) wound, not elsewhere classified, subsequent encounter 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

## 2020-09-18 NOTE — Progress Notes (Signed)
[name], [name] ([id number]) Visit Report for 09/17/2020 HBO Details Patient Name: Date of Service: [name] 09/17/2020 10:00 A M Medical Record Number: [id number] Patient Account Number: [id number] Date of Birth/Sex: Treating RN: Oct 27, 1970 (50 y.o. 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 HBO Treatment Details Treatment Number: 30 Patient Type: Outpatient Chamber Type: Monoplace Chamber Serial #: [id number] Treatment Protocol: 2.5 ATA with 90 minutes oxygen, with two 5 minute air breaks Treatment Details Compression Rate Down: 2.5 psi / minute De-Compression Rate Up: 2.5 psi / minute A breaks and breathing ir Compress Tx Pressure periods Decompress Decompress Begins Reached (leave unused spaces Begins Ends blank) Chamber Pressure (ATA 1 2.5 2.5 2.5 2.5 2.5 - - 2.5 1 ) Clock Time (24 hr) 10:34 10:43 11:13 11:18 11:48 11:53 - - 12:10 12:19 Treatment Length: 105 (minutes) Treatment Segments: 3 Vital Signs Capillary Blood Glucose Reference Range: 80 - 120 mg / dl HBO Diabetic Blood Glucose Intervention Range: <131 mg/dl or >249 mg/dl Time Vitals Blood Respiratory Capillary Blood Glucose Pulse Action Type: Pulse: Temperature: Taken: Pressure: Rate: Glucose (mg/dl): Meter #: Oximetry (%) Taken: Pre 09:51 120/78 81 16 98 Post 12:20 126/91 67 18 97.7 Treatment Response Treatment Toleration: Well Treatment Completion Status: Treatment Aborted/Not Restarted Reason: Patient Choice Treatment Notes Patient requested to be removed from the chamber today due to subsequent appointments  elsewhere. Kadia Abaya Notes No concerns with treatment given. Patient was also seen for wound care eval Physician HBO Attestation: I certify that I supervised this HBO treatment in accordance with Medicare guidelines. A trained emergency response team is readily available per Yes hospital policies and procedures. Continue HBOT as ordered. Yes Electronic Signature(s) Signed: 09/18/2020 3:40:47 PM By: [name] MD Entered By: [name] on 09/17/2020 16:48:41 -------------------------------------------------------------------------------- HBO Safety Checklist Details Patient Name: Date of Service: [name] 09/17/2020 10:00 A M Medical Record Number: [id number] Patient Account Number: [id number] Date of Birth/Sex: Treating RN: 01-30-1970 (50 y.o. [name], [name] Primary Care Prachi Oftedahl: [name] Other Clinician: [name] Referring Cypress Fanfan: Treating Amberly Livas/Extender: [name] in Treatment: 23 HBO Safety Checklist Items Safety Checklist Consent Form Signed Patient voided / foley secured and emptied When did you last eato snack this AM Last dose of injectable or oral agent n/a Ostomy pouch emptied and vented if applicable NA All implantable devices assessed, documented and approved NA Intravenous access site secured and place NA Valuables secured Linens and cotton and cotton/polyester blend (less than 51% polyester) Personal oil-based products / skin lotions / body lotions removed Wigs or hairpieces removed NA Smoking or tobacco materials removed NA Books / newspapers / magazines / loose paper removed Cologne, aftershave, perfume and deodorant removed Jewelry removed (may wrap wedding band) Make-up removed Hair care products removed Battery operated devices (external) removed Heating patches and chemical warmers removed Titanium eyewear removed NA Nail polish cured greater than 10 hours Casting  material cured greater than 10 hours NA Hearing aids removed NA Loose dentures or partials removed NA Prosthetics have been removed NA Patient demonstrates correct use of air break device (if applicable) Patient concerns have been addressed Patient grounding bracelet on and cord attached to chamber  Specifics for Inpatients (complete in addition to above) Medication sheet sent with patient NA Intravenous medications needed or due during therapy sent with patient NA Drainage tubes (e.g. nasogastric tube or chest tube secured and vented) NA Endotracheal or Tracheotomy tube secured NA Cuff deflated of air and inflated with saline NA Airway suctioned NA Electronic Signature(s) Signed: 09/18/2020 6:20:23 PM By: [name] EMT Entered By: [name] on 09/17/2020 12:03:54

## 2020-09-20 ENCOUNTER — Encounter (HOSPITAL_BASED_OUTPATIENT_CLINIC_OR_DEPARTMENT_OTHER): Payer: Medicaid Other | Admitting: Internal Medicine

## 2020-09-24 ENCOUNTER — Encounter (HOSPITAL_BASED_OUTPATIENT_CLINIC_OR_DEPARTMENT_OTHER): Payer: Medicaid Other | Admitting: Internal Medicine

## 2020-09-24 ENCOUNTER — Other Ambulatory Visit: Payer: Self-pay

## 2020-09-24 DIAGNOSIS — T8131XD Disruption of external operation (surgical) wound, not elsewhere classified, subsequent encounter: Secondary | ICD-10-CM | POA: Diagnosis not present

## 2020-09-24 NOTE — Progress Notes (Signed)
GERALYNN, CAPRI ([id number]) Visit Report for 09/24/2020 Debridement Details Patient Name: Date of Service: [name] 09/24/2020 2:30 PM Medical Record Number: [id number] Patient Account Number: [id number] Date of Birth/Sex: Treating RN: 1970-08-25 (50 y.o. [name], [name] Primary Care Provider: [name] Other Clinician: Referring Provider: Treating Provider/Extender: [name] in Treatment: 24 Debridement Performed for Assessment: Wound #1 Left Chest Performed By: Physician Ricard Dillon., MD Debridement Type: Debridement Level of Consciousness (Pre-procedure): Awake and Alert Pre-procedure Verification/Time Out Yes - 15:34 Taken: Start Time: 15:34 T Area Debrided (L x W): otal 3 (cm) x 2.9 (cm) = 8.7 (cm) Tissue and other material debrided: Viable, Non-Viable, Slough, Subcutaneous, Slough Level: Skin/Subcutaneous Tissue Debridement Description: Excisional Instrument: Curette Bleeding: Minimum Hemostasis Achieved: Pressure End Time: 15:35 Procedural Pain: 0 Post Procedural Pain: 0 Response to Treatment: Procedure was tolerated well Level of Consciousness (Post- Awake and Alert procedure): Post Debridement Measurements of Total Wound Length: (cm) 3 Width: (cm) 2.9 Depth: (cm) 1 Volume: (cm) 6.833 Character of Wound/Ulcer Post Debridement: Requires Further Debridement Post Procedure Diagnosis Same as Pre-procedure Electronic Signature(s) Signed: 09/24/2020 4:30:53 PM By: [name] MD Signed: 09/24/2020 6:09:57 PM By: Rhae Hammock RN Entered By: [name] on 09/24/2020 15:48:56 -------------------------------------------------------------------------------- HPI Details Patient Name: Date of Service: [name] 09/24/2020 2:30 PM Medical Record Number: [id number] Patient Account Number: [id number] Date of Birth/Sex: Treating RN: 1970/11/25 (50 y.o. [name], [name] Primary Care  Provider: [name] Other Clinician: Referring Provider: Treating Provider/Extender: [name] in Treatment: 24 History of Present Illness HPI Description: After canADMISSION 04/09/2020 This is a 50 year old woman who is history of breast cancer in the left breast goes back to 2018. He was found to have a mass in her left breast. She underwent a left back lumpectomy and then subsequent chemo that she did not tolerate very well. She had seed implants placed but then underwent external beam a radiation in the spring 2019. 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 Her adjunctive radiation was from 03/11/2017 through 04/21/2017. Unfortunately she had a left breast biopsy on 11/24/2019 and it was again positive for an invasive ductal carcinoma grade 3 he is to resume at receptor 80% positive. HER-2 negative progesterone negative. She opted for bilateral mastectomies which were done on 01/09/2020 on the right with no evidence of malignancy on the left stage IIb invasive ductal carcinoma. She again received chemotherapy but she did not tolerate it because of wound dehiscence and low platelet counts according to the patient. Unfortunately her mastectomy wound dehisced on the left breast. She was taken back to the OR by [name] of general surgery on 33/24/22 and she underwent a debridement and subsequent attempt at closure. She had been previously treated with Augmentin for suspected underlying infection. Once again the wound has dehisced in the mid aspect. It is roughly 1 cm deep there is tunnels both medially and laterally she. She still has sutures in place. Still describes constant pain drainage. She also has a drain still in place that drains about 10 cc/day. Currently she is changing ABD pad several times a day 4/12; patient went  back to see [name] who remove most of the stitches and debrided her wound. She has been using silver alginate culture I did last time which  was a PCR culture showed Peptostreptococcus, Enterococcus faecalis and Staphylococcus epidermidis which is of questionable significance. She has a larger wound now that the sutures have been removed. There are undermining areas superior medial superior lateral and a deep tunneling area in the inferior part of the wound. There is no obvious purulence. I started her on Augmentin on 04/12/2020 and I will likely extend this for another week AMAZINGLY; she is already been approved for HBO and will start today. [name] Echocardiogram which I reviewed from 2019 showed a normal ejection fraction.. I went over side effects of HBO with the patient and answered any of her questions. Her husband was present 5/3; its been a while since have seen this wound. She went to see [name] about 2-1/2 weeks ago according to the patient he can remove some of the surrounding debris. She will see him again later this week or early next week. She has been using silver alginate. A lot of this is problematic since she has Medicaid which traditionally does not cover wound care supplies although it traditionally does not cover HBO either Her wound actually looks quite a bit better than the last time I saw this better looking surface healthy looking tissue. She has tunnels at 810 2 in 6 the deepest of these is at 2 at 2.2 cm there is no exposed bone. 5/10; the patient was coming in for nurse visit today but requested that I see the wound because of surface discoloration. She states the Iodoflex makes the surface of this turned "white". She is not really complaining of undue tenderness. She is changing this every second day. The goal of this had been to get the surface of the wound to a vibrant red color and then potentially look at changing to a silver  collagen-based dressing, Puraply, and then perhaps an alternative skin substituteo Derma vest. The patient tells me she has an appoint with her oncologist [name] on the 19th. She apparently think she is going to get another PET scan. Based on this I would like his comment about whether there is possibility of any malignancy in the wound itself or just underneath it. Up to now we have not really thought that was true 5/17; nice improvement in the chest wound. Less depth. We have been using Iodoflex to clean off the wound surface this seems to have done nicely. Patient went to see [name] 2 days ago who has referred her to see Dr. Claudia Desanctis. I certainly agree with the thought the surface of the wound looks a lot better now and I think consideration of plastic surgery is certainly something the patient should discuss. I have talked to the patient about this today and I certainly support a conversation with Dr. Claudia Desanctis. I also wondered about is there any residual cancer in this. [name] apparently did not seem to think so per the patient although I do not see that in the text conversations we have had. I also wonder about a skin substitute if her version of Medicare [[employer]] would cover this. [name] is apparently considering starting her on [place]. Patient is already very scared about the side effects but she will discuss this with [name] on 5/19 5/31; patient's wound about the same in terms of size she still has the tunneling area to the left. Surface of the wound looks a lot better there has been some filling in the granulation I have been included on serial text between  Dr. Callie Fielding surgery] Dr. Ruffin Frederick oncology] and Dr. Para Skeans surgery]. I think there is not a lot of thought that the patient has recurrence of her cancer her MRI was indeterminant but [name] did not feel that there was any reason to think she has a major recurrence and he was not in  favor of doing a dedicated breast MRI. Dr. Claudia Desanctis wants to go ahead with a flap closure I did it. I have not reviewed his note but per the patient this is a latissimus dorsi myocutaneous flap or I think a skin flap from her abdomen. I have talked to the patient I think it is time to attempt this. If I can continue hyperbarics surrounding the flap then we will attempt to do this 6/14; patient using Prisma as the primary dressing here. She has completed hyperbarics and did very well. Her wound is responded nicely. The depth of this and the condition of the granulation tissue looks a lot better. She has undermining areas at 2 and 10:00 both of these seem to be better as well. She is scheduled for flap closure on 7/11 by Dr. Claudia Desanctis 6/29; the patient continues to use Prisma wet-to-dry. Her graft is scheduled for 7/11 by Dr. Claudia Desanctis. We are looking at getting her additional hyperbaric treatment which I think might help with the likelihood of graft survival in this difficult irradiated area 7/21; patient returns to clinic to reinitiate HBO after her surgical procedure by Dr. Claudia Desanctis. On 07/15/2020 she had debridement of left chest wound and a rotational flap closure. She also had excision of right excess axillary tissue. Pathology did not show any malignancy and follow-up Dr. Claudia Desanctis saw the patient yesterday. He noted a 2 cm area of wound dehiscence. Recommended she return for hyperbaric oxygen and she returns today for her first treatment. Follow-up with him in 2 weeks. We were not actually going to see the patient today her appointment to be evaluated was Monday however she was concerned about a "fishy odor". She is also noted some drainage. Not specifically dressing in the wound area but covering with Adaptic 7/25; I am seeing this wound and follow-up. So far her culture is polymicrobial not growing any specific anything specific nevertheless with the Augmentin and the silver alginate her odor is a lot better. Not as  much periwound tenderness. Dimensions measuring 2.7 x 3.3 x 1.6. She has undermining from 10-1 o'clock of about 1 and half centimeters. Necrotic fat on the surface of this... 08/13/2020 upon evaluation today patient appears to be doing decently well in regard to her wound all things considered. Obviously this is much more open than what was hoped for based on the fact that she had a flap performed which was supposed to completely close this wound. Nonetheless currently she tells me that she is not really having any significant pain fortunately the drainage is improved compared to last time there is still some evidence of odor as far as the drainage is concerned but is definitely not as purulent as what was previously noted. With that being said I do believe that there are quite a few sutures which are quite embedded. Nonetheless I believe this needs to be removed. In fact if they are not I am afraid this is again actually grow into the skin and that is can to be even bigger issue. Some of them even so at this point appeared that there could be difficult to even get out. 8/17; this is the first  time I am seeing this in about 3 weeks. The patient had stop by my office yesterday to discuss after completing hyperbaric therapy. Based on what she said I was expecting the worst today however apparently with dressing changes and perhaps debridement last week the surface of the wound actually looks better than what I was expecting. There is considerable depth although the last time I saw this the base of the wound was necrotic there is still some necrotic tissue on the surface of the wound bed however in general the rest the tissue looks healthy even with some bleeding. She has been using cleaning the wound with Dakin's and then applying Prisma and then backing Dakin's wet-to-dry. This seems to work to clean the wound surface up. They also ordered a wound VAC and I discussed this with her in depth today She is  not systemically unwell. She is not running a fever there is no odor. She talks about pain over the lower part of the flap to touch. This does not appear to be related to movement of her shoulder. 8/23; the patient has been using the wound VAC for 1 week. Already a remarkable improvement and she appears to be tolerating this well we had Santyl under the wound VAC however the surface looks so clean today I am doubtful that is going to be necessary She is also being treated with HBO. This is helped her dramatically. Apparently her current certification will be up on 8/31. She is only had 19 dives and we will certainly need to recertify her up for this. Since I saw her last week I put in a secure text message to Dr. Claudia Desanctis through epic to let him know that we were going ahead with a wound VAC. He did not have any objections to this. He is offering her more surgery however in the eventuality that this does not close 8/30; the patient missed 2 treatments of HBO last week because of nausea. I think she is now on Compazine which seems to have helped. Otherwise she is tolerating HBO well. We are collagen under wound VAC. Direct depth of wound measured at 1.2 cm which I think is probably unchanged however the undermining area is down to 0.5 which I think is an improvement 9/6; patient continues with chemotherapy although she says she is troll of her nausea. We are using collagen with a wound VAC on the remaining wound on her chest. No major improvements in the undermining area from 12-5 currently measuring 0.5 cm. This is no better than last week 9/13; patient continuing with chemotherapy. She completes HBO today. We are using silver collagen under wound VAC. No major improvements however the undermining area seems somewhat better to me. 9/20; we are using silver collagen under the wound VAC and although about 60% of the wound looks healthy. At the base of the wound there is still a very adherent slough. She  has been approved for TheraSkin which I would like to use if we can get the wound bed in a cleaner situation. Electronic Signature(s) Signed: 09/24/2020 4:30:53 PM By: [name] MD Entered By: [name] on 09/24/2020 15:49:43 -------------------------------------------------------------------------------- Physical Exam Details Patient Name: Date of Service: [name] 09/24/2020 2:30 PM Medical Record Number: [id number] Patient Account Number: [id number] Date of Birth/Sex: Treating RN: Jan 17, 1970 (50 y.o. [name], [name] Primary Care Provider: [name] Other Clinician: Referring Provider: Treating Provider/Extender: [name] in Treatment: 24 Constitutional Sitting  or standing Blood Pressure is within target range for patient.. Pulse regular and within target range for patient.Marland Kitchen Respirations regular, non-labored and within target range.. Temperature is normal and within the target range for the patient.Marland Kitchen Appears in no distress. Notes Wound exam; the upper surgical lesion really has not changed much in size. About 60% of this appears to have healthy granulation but the base of the wound still has a tightly adherent fibrinous slough. I remove this again with a #5 curette. Minimal bleeding. Electronic Signature(s) Signed: 09/24/2020 4:30:53 PM By: [name] MD Entered By: [name] on 09/24/2020 15:50:59 -------------------------------------------------------------------------------- Physician Orders Details Patient Name: Date of Service: [name] 09/24/2020 2:30 PM Medical Record Number: [id number] Patient Account Number: [id number] Date of Birth/Sex: Treating RN: [birth year] (50 y.o. 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 - Tuesday with Dr. Dellia Nims Return A Nurse Visit: - Vac dressing change Friday Bathing/ Shower/ Hygiene May shower with protection but do not get wound dressing(s) wet. Negative Presssure Wound Therapy Wound Vac to wound continuously at 180m/hg pressure Black Foam - Apply Santyl to wound bed, under foam Wound Treatment Wound #1 - Chest Wound Laterality: Left Cleanser: Wound Cleanser 2 x Per Week/15 Days Discharge Instructions: Cleanse the wound with wound cleanser prior to applying a clean dressing using gauze sponges, not tissue or cotton balls. Prim Dressing: Santyl Ointment 2 x Per Week/15 Days ary Discharge Instructions: Apply nickel thick amount to wound bed under vac foam Electronic Signature(s) Signed: 09/24/2020 4:30:53 PM By: RLinton HamMD Signed: 09/24/2020 5:42:07 PM By: LLevan HurstRN, BSN Entered By: LLevan Hurston 09/24/2020 15:36:41 -------------------------------------------------------------------------------- Problem List Details Patient Name: Date of Service: ARonnie DerbyIE D. 09/24/2020 2:30 PM Medical Record Number: [id number] Account Number: [id number] of Birth/Sex: Treating RN: 605-12-1970(50y.o. 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 09/24/2020 2:30 PM Medical Record Number: [id number] Account Number: [id number] of Birth/Sex: Treating RN: 6November 20, 1972(50y.o. FTonita Phoenix [name] Primary Care Provider: JKarle PlumberOther Clinician: Referring Provider: Treating Provider/Extender: RNyra Marketin Treatment: 24 Subjective History of Present Illness (HPI) After canADMISSION 04/09/2020 This is a 50year old woman who is history of breast cancer in the left breast goes back to 2018. He was found to have a  mass in her left breast. She underwent a left back lumpectomy and then subsequent chemo that she did not tolerate very well. She had seed implants placed but then underwent external beam a radiation in the spring 2019. 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 Her adjunctive radiation was from 03/11/2017 through 04/21/2017. Unfortunately she had a left breast biopsy on 11/24/2019 and it was again positive for an invasive ductal carcinoma grade 3 he is to resume at receptor 80% positive. HER-2 negative progesterone negative. She opted for bilateral mastectomies which were done on 01/09/2020 on the right with no evidence  of malignancy on the left stage IIb invasive ductal carcinoma. She again received chemotherapy but she did not tolerate it because of wound dehiscence and low platelet counts according to the patient. Unfortunately her mastectomy wound dehisced on the left breast. She was taken back to the OR by [name] of general surgery on 33/24/22 and she underwent a debridement and subsequent attempt at closure. She had been previously treated with Augmentin for suspected underlying infection. Once again the wound has dehisced in the mid aspect. It is roughly 1 cm deep there is tunnels both medially and laterally she. She still has sutures in place. Still describes constant pain drainage. She also has a drain still in place that drains about 10 cc/day. Currently she is changing ABD pad several times a day 4/12; patient went back to see [name] who remove most of the stitches and debrided her wound. She has been using silver alginate culture I did last time which was a PCR culture showed Peptostreptococcus, Enterococcus faecalis and Staphylococcus epidermidis which is of questionable significance. She has a larger wound now that the sutures have been removed. There are undermining areas superior medial superior lateral and a deep tunneling area in the inferior part of the wound. There is no obvious purulence. I started her on Augmentin on 04/12/2020 and I will likely extend this for another week AMAZINGLY; she is already been approved for HBO and will start today. [name] Echocardiogram which I reviewed from 2019 showed a normal ejection fraction.. I went over side effects of HBO with the patient and answered any of her questions. Her husband was present 5/3; its been a while since have seen this wound. She went to see [name] about 2-1/2 weeks ago according to the patient he can remove some of the surrounding debris. She will see him again later this week or early next week.  She has been using silver alginate. A lot of this is problematic since she has Medicaid which traditionally does not cover wound care supplies although it traditionally does not cover HBO either Her wound actually looks quite a bit better than the last time I saw this better looking surface healthy looking tissue. She has tunnels at 810 2 in 6 the deepest of these is at 2 at 2.2 cm there is no exposed bone. 5/10; the patient was coming in for nurse visit today but requested that I see the wound because of surface discoloration. She states the Iodoflex makes the surface of this turned "white". She is not really complaining of undue tenderness. She is changing this every second day. The goal of this had been to get the surface of the wound to a vibrant red color and then potentially look at changing to a  silver collagen-based dressing, Puraply, and then perhaps an alternative skin substituteo Derma vest. The patient tells me she has an appoint with her oncologist [name] on the 19th. She apparently think she is going to get another PET scan. Based on this I would like his comment about whether there is possibility of any malignancy in the wound itself or just underneath it. Up to now we have not really thought that was true 5/17; nice improvement in the chest wound. Less depth. We have been using Iodoflex to clean off the wound surface this seems to have done nicely. Patient went to see [name] 2 days ago who has referred her to see Dr. Claudia Desanctis. I certainly agree with the thought the surface of the wound looks a lot better now and I think consideration of plastic surgery is certainly something the patient should discuss. I have talked to the patient about this today and I certainly support a conversation with Dr. Claudia Desanctis. I also wondered about is there any residual cancer in this. [name] apparently did not seem to think so per the patient although I do not see that in the text conversations  we have had. I also wonder about a skin substitute if her version of Medicare [[employer]] would cover this. [name] is apparently considering starting her on [place]. Patient is already very scared about the side effects but she will discuss this with [name] on 5/19 5/31; patient's wound about the same in terms of size she still has the tunneling area to the left. Surface of the wound looks a lot better there has been some filling in the granulation I have been included on serial text between Dr. Callie Fielding surgery] Dr. Ruffin Frederick oncology] and Dr. Para Skeans surgery]. I think there is not a lot of thought that the patient has recurrence of her cancer her MRI was indeterminant but [name] did not feel that there was any reason to think she has a major recurrence and he was not in favor of doing a dedicated breast MRI. Dr. Claudia Desanctis wants to go ahead with a flap closure I did it. I have not reviewed his note but per the patient this is a latissimus dorsi myocutaneous flap or I think a skin flap from her abdomen. I have talked to the patient I think it is time to attempt this. If I can continue hyperbarics surrounding the flap then we will attempt to do this 6/14; patient using Prisma as the primary dressing here. She has completed hyperbarics and did very well. Her wound is responded nicely. The depth of this and the condition of the granulation tissue looks a lot better. She has undermining areas at 2 and 10:00 both of these seem to be better as well. She is scheduled for flap closure on 7/11 by Dr. Claudia Desanctis 6/29; the patient continues to use Prisma wet-to-dry. Her graft is scheduled for 7/11 by Dr. Claudia Desanctis. We are looking at getting her additional hyperbaric treatment which I think might help with the likelihood of graft survival in this difficult irradiated area 7/21; patient returns to clinic to reinitiate HBO after her surgical procedure by Dr. Claudia Desanctis. On 07/15/2020 she had  debridement of left chest wound and a rotational flap closure. She also had excision of right excess axillary tissue. Pathology did not show any malignancy and follow-up Dr. Claudia Desanctis saw the patient yesterday. He noted a 2 cm area of wound dehiscence. Recommended she return for hyperbaric oxygen and she returns  today for her first treatment. Follow-up with him in 2 weeks. We were not actually going to see the patient today her appointment to be evaluated was Monday however she was concerned about a "fishy odor". She is also noted some drainage. Not specifically dressing in the wound area but covering with Adaptic 7/25; I am seeing this wound and follow-up. So far her culture is polymicrobial not growing any specific anything specific nevertheless with the Augmentin and the silver alginate her odor is a lot better. Not as much periwound tenderness. Dimensions measuring 2.7 x 3.3 x 1.6. She has undermining from 10-1 o'clock of about 1 and half centimeters. Necrotic fat on the surface of this... 08/13/2020 upon evaluation today patient appears to be doing decently well in regard to her wound all things considered. Obviously this is much more open than what was hoped for based on the fact that she had a flap performed which was supposed to completely close this wound. Nonetheless currently she tells me that she is not really having any significant pain fortunately the drainage is improved compared to last time there is still some evidence of odor as far as the drainage is concerned but is definitely not as purulent as what was previously noted. With that being said I do believe that there are quite a few sutures which are quite embedded. Nonetheless I believe this needs to be removed. In fact if they are not I am afraid this is again actually grow into the skin and that is can to be even bigger issue. Some of them even so at this point appeared that there could be difficult to even get out. 8/17; this is the  first time I am seeing this in about 3 weeks. The patient had stop by my office yesterday to discuss after completing hyperbaric therapy. Based on what she said I was expecting the worst today however apparently with dressing changes and perhaps debridement last week the surface of the wound actually looks better than what I was expecting. There is considerable depth although the last time I saw this the base of the wound was necrotic there is still some necrotic tissue on the surface of the wound bed however in general the rest the tissue looks healthy even with some bleeding. She has been using cleaning the wound with Dakin's and then applying Prisma and then backing Dakin's wet-to-dry. This seems to work to clean the wound surface up. They also ordered a wound VAC and I discussed this with her in depth today She is not systemically unwell. She is not running a fever there is no odor. She talks about pain over the lower part of the flap to touch. This does not appear to be related to movement of her shoulder. 8/23; the patient has been using the wound VAC for 1 week. Already a remarkable improvement and she appears to be tolerating this well we had Santyl under the wound VAC however the surface looks so clean today I am doubtful that is going to be necessary She is also being treated with HBO. This is helped her dramatically. Apparently her current certification will be up on 8/31. She is only had 19 dives and we will certainly need to recertify her up for this. Since I saw her last week I put in a secure text message to Dr. Claudia Desanctis through epic to let him know that we were going ahead with a wound VAC. He did not have any objections to this. He is  offering her more surgery however in the eventuality that this does not close 8/30; the patient missed 2 treatments of HBO last week because of nausea. I think she is now on Compazine which seems to have helped. Otherwise she is tolerating HBO well. We are  collagen under wound VAC. Direct depth of wound measured at 1.2 cm which I think is probably unchanged however the undermining area is down to 0.5 which I think is an improvement 9/6; patient continues with chemotherapy although she says she is troll of her nausea. We are using collagen with a wound VAC on the remaining wound on her chest. No major improvements in the undermining area from 12-5 currently measuring 0.5 cm. This is no better than last week 9/13; patient continuing with chemotherapy. She completes HBO today. We are using silver collagen under wound VAC. No major improvements however the undermining area seems somewhat better to me. 9/20; we are using silver collagen under the wound VAC and although about 60% of the wound looks healthy. At the base of the wound there is still a very adherent slough. She has been approved for TheraSkin which I would like to use if we can get the wound bed in a cleaner situation. Objective Constitutional Sitting or standing Blood Pressure is within target range for patient.. Pulse regular and within target range for patient.Marland Kitchen Respirations regular, non-labored and within target range.. Temperature is normal and within the target range for the patient.Marland Kitchen Appears in no distress. Vitals Time Taken: 3:16 PM, Height: 67 in, Weight: 257 lbs, BMI: 40.2, Temperature: 98.5 F, Pulse: 85 bpm, Respiratory Rate: 16 breaths/min, Blood Pressure: 131/90 mmHg. General Notes: Wound exam; the upper surgical lesion really has not changed much in size. About 60% of this appears to have healthy granulation but the base of the wound still has a tightly adherent fibrinous slough. I remove this again with a #5 curette. Minimal bleeding. Integumentary (Hair, Skin) Wound #1 status is Open. Original cause of wound was Surgical Injury. The date acquired was: 01/16/2020. The wound has been in treatment 24 weeks. The wound is located on the Left Chest. The wound measures 3cm length x  2.9cm width x 1cm depth; 6.833cm^2 area and 6.833cm^3 volume. There is Fat Layer (Subcutaneous Tissue) exposed. There is no tunneling noted, however, there is undermining starting at 10:00 and ending at 4:00 with a maximum distance of 0.6cm. There is a medium amount of serosanguineous drainage noted. The wound margin is well defined and not attached to the wound base. There is large (67- 100%) pink granulation within the wound bed. There is a small (1-33%) amount of necrotic tissue within the wound bed including Adherent Slough. 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 There was a Excisional Skin/Subcutaneous Tissue Debridement with a total area of 8.7 sq cm performed by Ricard Dillon., MD. With the following instrument(s): Curette to remove Viable and Non-Viable tissue/material. Material removed includes Subcutaneous Tissue and Slough and. No specimens were taken. A time out was conducted at 15:34, prior to the start of the procedure. A Minimum amount of bleeding was controlled with Pressure. The procedure was tolerated well with a pain level of 0 throughout and  a pain level of 0 following the procedure. Post Debridement Measurements: 3cm length x 2.9cm width x 1cm depth; 6.833cm^3 volume. Character of Wound/Ulcer Post Debridement requires further debridement. Post procedure Diagnosis Wound #1: Same as Pre-Procedure Plan Follow-up Appointments: Return Appointment in 1 week. - Tuesday with Dr. Dellia Nims Nurse Visit: - Vac dressing change Friday Bathing/ Shower/ Hygiene: May shower with protection but do not get wound  dressing(s) wet. Negative Presssure Wound Therapy: Wound Vac to wound continuously at 12m/hg pressure Black Foam - Apply Santyl to wound bed, under foam WOUND #1: - Chest Wound Laterality: Left Cleanser: Wound Cleanser 2 x Per Week/15 Days Discharge Instructions: Cleanse the wound with wound cleanser prior to applying a clean dressing using gauze sponges, not tissue or cotton balls. Prim Dressing: Santyl Ointment 2 x Per Week/15 Days ary Discharge Instructions: Apply nickel thick amount to wound bed under vac foam 1. We are going to use Santyl under the wound VAC to see if we can get the surface to clean up. 2. Depending on the surface of the wound I will attempt TheraSkin Electronic Signature(s) Signed: 09/24/2020 4:30:53 PM By: RLinton HamMD Entered By: RLinton Hamon 09/24/2020 15:51:43 -------------------------------------------------------------------------------- SuperBill Details Patient Name: Date of Service: ARonnie DerbyIE D. 09/24/2020 Medical Record Number: [id number] Account Number: [id number] of Birth/Sex: Treating RN: 6October 05, 1972(50y.o. 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

## 2020-09-24 NOTE — Progress Notes (Signed)
[name], [name] ([id number]) Visit Report for 09/24/2020 Arrival Information Details Patient Name: Date of Service: [name] 09/24/2020 2:30 PM Medical Record Number: [id number] Patient Account Number: [id number] Date of Birth/Sex: Treating RN: 09-23-1970 (50 y.o. [name], [name] Primary Care [name]: [name] Other Clinician: Referring Amantha Sklar: Treating Takeo Harts/Extender: [name] in Treatment: 24 Visit Information History Since Last Visit Added or deleted any medications: No Patient Arrived: Ambulatory Any new allergies or adverse reactions: No Arrival Time: 15:16 Had a fall or experienced change in No Accompanied By: alone activities of daily living that may affect Transfer Assistance: None risk of falls: Patient Identification Verified: Yes Signs or symptoms of abuse/neglect since last visito No Secondary Verification Process Completed: Yes Hospitalized since last visit: No Patient Requires Transmission-Based Precautions: No Implantable device outside of the clinic excluding No Patient Has Alerts: No cellular tissue based products placed in the center since last visit: Has Dressing in Place as Prescribed: Yes Pain Present Now: No Electronic Signature(s) Signed: 09/24/2020 5:42:07 PM By: Sarah Hurst RN, BSN Entered By: [name] on 09/24/2020 15:17:05 -------------------------------------------------------------------------------- Encounter Discharge Information Details Patient Name: Date of Service: Sarah Crochet V [name] 09/24/2020 2:30 PM Medical Record Number: [id number] Patient Account Number: [id number] Date of Birth/Sex: Treating RN: 03-26-1970 (50 y.o. [name] Primary Care Redell Bhandari: [name] Other Clinician: Referring Lavana Huckeba: Treating Riggs Dineen/Extender: [name] in Treatment: 24 Encounter Discharge Information Items Post Procedure Vitals Discharge  Condition: Stable Temperature (F): 98.5 Ambulatory Status: Ambulatory Pulse (bpm): 85 Discharge Destination: Home Respiratory Rate (breaths/min): 16 Transportation: Private Auto Blood Pressure (mmHg): 131/90 Accompanied By: alone Schedule Follow-up Appointment: Yes Clinical Summary of Care: Patient Declined Electronic Signature(s) Signed: 09/24/2020 5:42:07 PM By: Sarah Hurst RN, BSN Entered By: [name] on 09/24/2020 17:01:49 -------------------------------------------------------------------------------- Multi Wound Chart Details Patient Name: Date of Service: Sarah Crochet V [name] 09/24/2020 2:30 PM Medical Record Number: [id number] Patient Account Number: [id number] Date of Birth/Sex: Treating RN: June 30, 1970 (50 y.o. [name], [name] Primary Care Heidi Maclin: [name] Other Clinician: Referring Herberto Ledwell: Treating Maygan Koeller/Extender: [name] in Treatment: 24 Vital Signs Height(in): 59 Pulse(bpm): 90 Weight(lbs): 85 Blood Pressure(mmHg): 131/90 Body Mass Index(BMI): 40 Temperature(F): 98.5 Respiratory Rate(breaths/min): 16 Photos: [1:No Photos Left Chest] [N/A:N/A N/A] Wound Location: [1:Surgical Injury] [N/A:N/A] Wounding Event: [1:Open Surgical Wound] [N/A:N/A] Primary Etiology: [1:Necrosis (Radiation)] [N/A:N/A] Secondary Etiology: [1:Hypertension, Osteoarthritis, ReceivedN/A] Comorbid History: [1:Chemotherapy, Received Radiation 01/16/2020] [N/A:N/A] Date Acquired: [1:24] [N/A:N/A] Weeks of Treatment: [1:Open] [N/A:N/A] Wound Status: [1:3x2.9x1] [N/A:N/A] Measurements L x W x D (cm) [1:6.833] [N/A:N/A] A (cm) : rea [1:6.833] [N/A:N/A] Volume (cm) : [1:-7.40%] [N/A:N/A] % Reduction in A [1:rea: 2.40%] [N/A:N/A] % Reduction in Volume: [1:10] Starting Position 1 (o'clock): [1:4] Ending Position 1 (o'clock): [1:0.6] Maximum Distance 1 (cm): [1:Yes] [N/A:N/A] Undermining: [1:Full Thickness Without Exposed]  [N/A:N/A] Classification: [1:Support Structures Medium] [N/A:N/A] Exudate A mount: [1:Serosanguineous] [N/A:N/A] Exudate Type: [1:red, brown] [N/A:N/A] Exudate Color: [1:Well defined, not attached] [N/A:N/A] Wound Margin: [1:Large (67-100%)] [N/A:N/A] Granulation A mount: [1:Pink] [N/A:N/A] Granulation Quality: [1:Small (1-33%)] [N/A:N/A] Necrotic A mount: [1:Fat Layer (Subcutaneous Tissue): Yes N/A] Exposed Structures: [1:Fascia: No Tendon: No Muscle: No Joint: No Bone: No Small (1-33%)] [N/A:N/A] Epithelialization: [1:Debridement - Excisional] [N/A:N/A] Debridement: Pre-procedure Verification/Time Out 15:34 [N/A:N/A] Taken: [1:Subcutaneous, Slough] [N/A:N/A] Tissue Debrided: [1:Skin/Subcutaneous Tissue] [N/A:N/A] Level: [1:8.7] [N/A:N/A] Debridement A (sq cm): [1:rea Curette] [N/A:N/A] Instrument: [1:Minimum] [N/A:N/A] Bleeding: [1:Pressure] [N/A:N/A] Hemostasis A chieved: [  1:0] [N/A:N/A] Procedural Pain: [1:0] [N/A:N/A] Post Procedural Pain: [1:Procedure was tolerated well] [N/A:N/A] Debridement Treatment Response: [1:3x2.9x1] [N/A:N/A] Post Debridement Measurements L x W x D (cm) [1:6.833] [N/A:N/A] Post Debridement Volume: (cm) [1:Debridement] [N/A:N/A] Treatment Notes Electronic Signature(s) Signed: 09/24/2020 4:30:53 PM By: [name] MD Signed: 09/24/2020 6:09:57 PM By: Rhae Hammock RN Entered By: [name] on 09/24/2020 15:48:41 -------------------------------------------------------------------------------- Multi-Disciplinary Care Plan Details Patient Name: Date of Service: Sarah Crochet V [name] 09/24/2020 2:30 PM Medical Record Number: [id number] Patient Account Number: [id number] Date of Birth/Sex: Treating RN: 13-Apr-1970 (50 y.o. [name] Primary Care Airika Alkhatib: [name] Other Clinician: Referring Karen Huhta: Treating Marcile Fuquay/Extender: [name] in Treatment: 24 Active Inactive Wound/Skin  Impairment Nursing Diagnoses: Impaired tissue integrity Knowledge deficit related to smoking impact on wound healing Knowledge deficit related to ulceration/compromised skin integrity Goals: Patient will demonstrate a reduced rate of smoking or cessation of smoking Date Initiated: 04/09/2020 Date Inactivated: 06/18/2020 Target Resolution Date: 06/28/2020 Goal Status: Met Patient will have a decrease in wound volume by X% from date: (specify in notes) Date Initiated: 04/09/2020 Date Inactivated: 07/03/2020 Target Resolution Date: 07/05/2020 Goal Status: Met Patient/caregiver will verbalize understanding of skin care regimen Date Initiated: 04/09/2020 Target Resolution Date: 10/04/2020 Goal Status: Active Ulcer/skin breakdown will have a volume reduction of 30% by week 4 Date Initiated: 04/09/2020 Date Inactivated: 05/14/2020 Target Resolution Date: 05/11/2020 Unmet Reason: see wound Goal Status: Unmet meaurements. Interventions: Assess patient/caregiver ability to obtain necessary supplies Assess patient/caregiver ability to perform ulcer/skin care regimen upon admission and as needed Assess ulceration(s) every visit Provide education on smoking Notes: Electronic Signature(s) Signed: 09/24/2020 5:42:07 PM By: Sarah Hurst RN, BSN Entered By: [name] on 09/24/2020 15:32:01 -------------------------------------------------------------------------------- Pain Assessment Details Patient Name: Date of Service: Sarah Crochet V [name] 09/24/2020 2:30 PM Medical Record Number: [id number] Patient Account Number: [id number] Date of Birth/Sex: Treating RN: 10-May-1970 (50 y.o. [name] Primary Care Oskar Cretella: [name] Other Clinician: Referring Jayquon Theiler: Treating Chet Greenley/Extender: [name] in Treatment: 24 Active Problems Location of Pain Severity and Description of Pain Patient Has Paino No Site Locations Pain Management and  Medication Current Pain Management: Electronic Signature(s) Signed: 09/24/2020 5:42:07 PM By: Sarah Hurst RN, BSN Entered By: [name] on 09/24/2020 15:18:05 -------------------------------------------------------------------------------- Patient/Caregiver Education Details Patient Name: Date of Service: [name] 9/20/2022andnbsp2:30 PM Medical Record Number: [id number] Patient Account Number: [id number] Date of Birth/Gender: Treating RN: 11/27/1970 (50 y.o. [name] Primary Care Physician: [name] Other Clinician: Referring Physician: Treating Physician/Extender: [name] in Treatment: 24 Education Assessment Education Provided To: Patient Education Topics Provided Wound/Skin Impairment: Methods: Explain/Verbal Responses: State content correctly [employer]) Signed: 09/24/2020 5:42:07 PM By: Sarah Hurst RN, BSN Entered By: [name] on 09/24/2020 15:32:20 -------------------------------------------------------------------------------- Wound Assessment Details Patient Name: Date of Service: Sarah Crochet V [name] 09/24/2020 2:30 PM Medical Record Number: [id number] Patient Account Number: [id number] Date of Birth/Sex: Treating RN: 1970/09/24 (50 y.o. [name] Primary Care Legrande Hao: [name] Other Clinician: Referring Jermika Olden: Treating Wyolene Weimann/Extender: [name] in Treatment: 24 Wound Status Wound Number: 1 Primary Open Surgical Wound Etiology: Wound Location: Left Chest Secondary Necrosis (Radiation) Wounding Event: Surgical Injury Etiology: Date Acquired: 01/16/2020 Wound Status: Open Weeks Of Treatment: 24 Comorbid Hypertension, Osteoarthritis, Received Chemotherapy, Clustered Wound: No History: Received Radiation Wound Measurements Length: (cm) 3 Width: (cm) 2.9 Depth: (  cm) 1 Area: (cm) 6.833 Volume: (cm) 6.833 %  Reduction in Area: -7.4% % Reduction in Volume: 2.4% Epithelialization: Small (1-33%) Tunneling: No Undermining: Yes Starting Position (o'clock): 10 Ending Position (o'clock): 4 Maximum Distance: (cm) 0.6 Wound Description Classification: Full Thickness Without Exposed Support Structures Wound Margin: Well defined, not attached Exudate Amount: Medium Exudate Type: Serosanguineous Exudate Color: red, brown Foul Odor After Cleansing: No Slough/Fibrino Yes Wound Bed Granulation Amount: Large (67-100%) Exposed Structure Granulation Quality: Pink Fascia Exposed: No Necrotic Amount: Small (1-33%) Fat Layer (Subcutaneous Tissue) Exposed: Yes Necrotic Quality: Adherent Slough Tendon Exposed: No Muscle Exposed: No Joint Exposed: No Bone Exposed: No Treatment Notes Wound #1 (Chest) Wound Laterality: Left Cleanser Wound Cleanser Discharge Instruction: Cleanse the wound with wound cleanser prior to applying a clean dressing using gauze sponges, not tissue or cotton balls. Peri-Wound Care Topical Primary Dressing Santyl Ointment Discharge Instruction: Apply nickel thick amount to wound bed under vac foam Secondary Dressing Secured With Compression Wrap Compression Stockings Add-Ons Electronic Signature(s) Signed: 09/24/2020 5:42:07 PM By: Sarah Hurst RN, BSN Entered By: [name] on 09/24/2020 15:22:56 -------------------------------------------------------------------------------- Vitals Details Patient Name: Date of Service: Sarah Crochet V [name] 09/24/2020 2:30 PM Medical Record Number: [id number] Patient Account Number: [id number] Date of Birth/Sex: Treating RN: 1970/09/11 (50 y.o. [name] Primary Care Keyonia Gluth: [name] Other Clinician: Referring Naela Nodal: Treating Pollie Poma/Extender: [name] in Treatment: 24 Vital Signs Time Taken: 15:16 Temperature (F): 98.5 Height (in): 67 Pulse (bpm): 85 Weight (lbs):  257 Respiratory Rate (breaths/min): 16 Body Mass Index (BMI): 40.2 Blood Pressure (mmHg): 131/90 Reference Range: 80 - 120 mg / dl Electronic Signature(s) Signed: 09/24/2020 5:42:07 PM By: Sarah Hurst RN, BSN Entered By: [name] on 09/24/2020 15:17:58

## 2020-09-26 ENCOUNTER — Other Ambulatory Visit (HOSPITAL_COMMUNITY): Payer: Self-pay

## 2020-09-27 ENCOUNTER — Encounter (HOSPITAL_BASED_OUTPATIENT_CLINIC_OR_DEPARTMENT_OTHER): Payer: Medicaid Other | Admitting: Internal Medicine

## 2020-09-27 ENCOUNTER — Other Ambulatory Visit: Payer: Self-pay

## 2020-09-27 DIAGNOSIS — T8131XD Disruption of external operation (surgical) wound, not elsewhere classified, subsequent encounter: Secondary | ICD-10-CM | POA: Diagnosis not present

## 2020-09-27 NOTE — Progress Notes (Signed)
BRYNNA, DOBOS ([id number]) Visit Report for 09/27/2020 SuperBill Details Patient Name: Date of Service: [name] 09/27/2020 Medical Record Number: [id number] Patient Account Number: [id number] Date of Birth/Sex: Treating RN: [birth year] (50 y.o. 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

## 2020-09-29 NOTE — Progress Notes (Signed)
[name]  Telephone:([phone]) [phone] Fax:([phone]     ID: [name] DOB: 1970-08-21  MR#: [id number]  RAQ#:[id number]  Patient Care Team: [name], MD as PCP - General (Internal Medicine) , [name], MD as Consulting Physician (Oncology) [name], MD as Attending Physician (Radiation Oncology) Delice Bison Charlestine Massed, NP as Nurse Practitioner (Hematology and Oncology) Rolm Bookbinder, MD as Consulting Physician (General Surgery) Claudia Desanctis Steffanie Dunn, MD as Consulting Physician (Plastic Surgery) Mauro Kaufmann, RN as Oncology Nurse Navigator Rockwell Germany, RN as Oncology Nurse Navigator Milus Banister, MD as Attending Physician (Gastroenterology) Ricard Dillon, MD as Consulting Physician (Internal Medicine) OTHER MD:   CHIEF COMPLAINT: Triple negative breast cancer; BRCA2 positive  CURRENT TREATMENT: Hyperbaric treatment; olaparib    INTERVAL HISTORY: [name] returns today for a follow-up of her triple negative breast cancer .  Her husband [name] did not come with her which is a good sign that things are on the mend  She completed her hyperbaric treatments on 09/16/2020.  There was significant improvement.  She was approved for a cadaver tissue which is going to be applied later this week.  In the meantime she has a wound VAC that is working well for her.  She was started on olaparib at her last visit on 08/13/2020.  The standard dose is 300 mg twice daily for 1 year.  She was started at 150 mg daily given her intercurrent problems and anxiety relating to side effects from this medication.  She is tolerating 150 mg daily with no side effects and she is interested in increasing the dose at this point  Her bilateral salpingo-oophorectomy and hysterectomy has been postponed until after the current issues have settled down.   REVIEW OF SYSTEMS: [name] is much more active now, not taking naps during the day, and walking she thinks  about 7000 steps a day according to her step counter.  She is not able to help her husband at work because she is intolerant of the heat with her wound.  Overall a detailed review of systems is improved as compared to prior   COVID 19 VACCINATION STATUS: not vaccinated (as of September 2022)  BREAST CANCER HISTORY: From the original intake note:  The patient was seen in the emergency room 05/16/2016 with nonspecific complaints but also noting that she had a tender lump in her lateral left breast which she said she had noted the day before. Exam by the emergency room physician confirmed a 3 cm soft left lateral breast mass without overlying erythema or nipple changes. This was felt to be most consistent with fibrocystic change but the patient was referred back to her primary care physician for further evaluation. She saw the physician assistant in May 18 and then Dr. Wynetta Emery on 06/12/2016 who scheduled a bilateral diagnostic mammography with tomography and left breast ultrasonography at the [facility] 06/18/2016. This found the breast density to be category B. In the left breast upper outer quadrant there was an area of asymmetry measuring up to 10.3 cm. On exam this was firm and palpable and on ultrasound there was an irregular mass measuring 2.5 cm with some subtle changes in the surrounding tissue. In the left lower axilla there were 2 suspicious-looking lymph nodes.  Biopsy of the left breast upper outer quadrant and one of the suspicious lymph nodes 06/19/2016 showed ([id number]) both to be involved by invasive ductal carcinoma, grade 3, estrogen and progesterone receptor negative, HER-2 not amplified  with a signals ratio being 1.53-1.74 and the number per cell 2.35-3.05. The MIB-1 was 90%.  The patient's subsequent history is as detailed below.   PAST MEDICAL HISTORY: Past Medical History:  Diagnosis Date   Arthritis    Breast cancer ([place])    Left Breast Cancer   Breast cancer ([place])  72/53/6644   left    Complication of anesthesia    DAY SURGERY 2010 OR 2011 ASPIRATED AND STAYED OVERNIGHT   History of radiation therapy 03/11/17- 04/21/17   Left breast, 2 Gy in 25 fractions for a total dose of 50 Gy, Boost, 2 Gy in 5 fractions for a total dose of 10 Gy   Hypertension    OFF MEDS  SINCE CHEMO X 5 MONTHS   Malignant neoplasm of upper-outer quadrant of left breast in female, estrogen receptor negative ([place]) 06/23/2016   Personal history of chemotherapy 2019   Left Breast Cancer  07-2016-12-2016   Personal history of radiation therapy 2019   Left Breast Cancer    PAST SURGICAL HISTORY: Past Surgical History:  Procedure Laterality Date   ANKLE SURGERY Right    BREAST CYST EXCISION Right 07/15/2020   Procedure: Excision excess right axillary tissue;  Surgeon: Cindra Presume, MD;  Location: [facility];  Service: Plastics;  Laterality: Right;   BREAST LUMPECTOMY Left 01/27/2017   BREAST LUMPECTOMY WITH RADIOACTIVE SEED AND SENTINEL LYMPH NODE BIOPSY Left 01/27/2017   Procedure: LEFT BREAST LUMPECTOMY WITH RADIOACTIVE SEED LOCALIZATION, LEFT AXILLARY DEEP LYMPH NODE BIOPSY WITH RADIOACTIVE SEED LOCALIZATION, LEFT AXILLARY SENTINEL LYMPH NODE MAPPING AND BIOPSY WITH BLUE DYE INJECTION;  Surgeon: [name], MD;  Location: [facility];  Service: General;  Laterality: Left;   COLONOSCOPY WITH PROPOFOL N/A 02/01/2020   Procedure: COLONOSCOPY WITH PROPOFOL;  Surgeon: Milus Banister, MD;  Location: [facility];  Service: Endoscopy;  Laterality: N/A;   DEBRIDEMENT AND CLOSURE WOUND Left 03/28/2020   Procedure: DEBRIDEMENT AND CLOSURE OF MASTECTOMY WOUND;  Surgeon: Rolm Bookbinder, MD;  Location: [facility];  Service: General;  Laterality: Left;   DEBRIDEMENT AND CLOSURE WOUND Left 07/15/2020   Procedure: Debridement of left chest wound and rotational flap closure;  Surgeon: Cindra Presume, MD;  Location: [facility];  Service: Plastics;  Laterality: Left;  1.5 hour   ENDOSCOPIC MUCOSAL RESECTION   02/01/2020   Procedure: ENDOSCOPIC MUCOSAL RESECTION;  Surgeon: Milus Banister, MD;  Location: [facility];  Service: Endoscopy;;   HEMOSTASIS CLIP PLACEMENT  02/01/2020   Procedure: HEMOSTASIS CLIP PLACEMENT;  Surgeon: Milus Banister, MD;  Location: [facility];  Service: Endoscopy;;   IR FLUORO GUIDE PORT INSERTION RIGHT  07/14/2016   IR US GUIDE VASC ACCESS RIGHT  07/14/2016   MASTECTOMY W/ SENTINEL NODE BIOPSY Bilateral 01/09/2020   Procedure: BILATERAL MASTECTOMY WITH LEFT AXILLARY SENTINEL LYMPH NODE BIOPSY;  Surgeon: Rolm Bookbinder, MD;  Location: [facility];  Service: General;  Laterality: Bilateral;   PORT-A-CATH REMOVAL Right 01/27/2017   Procedure: REMOVAL PORT-A-CATH;  Surgeon: [name], MD;  Location: [facility];  Service: General;  Laterality: Right;   PORTACATH PLACEMENT Right 01/09/2020   Procedure: INSERTION PORT-A-CATH;  Surgeon: Rolm Bookbinder, MD;  Location: [facility];  Service: General;  Laterality: Right;   SUBMUCOSAL LIFTING INJECTION  02/01/2020   Procedure: SUBMUCOSAL LIFTING INJECTION;  Surgeon: Milus Banister, MD;  Location: [facility];  Service: Endoscopy;;   TUBAL LIGATION     WISDOM TOOTH EXTRACTION      FAMILY HISTORY  Family History  Problem Relation Age of Onset   Diabetes Mother    Hypertension Mother    Arthritis Mother    Colon cancer Father 22       died .26 metastatic at time of diagnosis   Diabetes Father    Heart disease Father    Hypertension Father    Breast cancer Maternal Grandmother 52       d.78s   Breast cancer Maternal Aunt 52   Cervical cancer Maternal Aunt 55   Breast cancer Maternal Aunt 46       d.50s   Cancer Maternal Uncle        d.62s unspecified type of cancer   Breast cancer Cousin 68       paternal first-cousin (daughter of unaffected aunt)   Cancer Maternal Aunt 34       "Female Cancer"   Cancer Maternal Aunt        unknown cancer   Brain cancer Daughter 64    Esophageal cancer Other    Colon polyps Neg Hx    Rectal cancer Neg Hx    Stomach cancer Neg Hx   The patient's father died at age 27 from colon cancer which she never had treated. The patient's mother is living, 26 years old as of June 2018. The patient has one brother, one sister. On the mother's side there is a history of breast cancer in the grandmother, age 31, and then 1 aunt with breast cancer age 1, and another with lung cancer age 41. There is no history of ovarian cancer in the family.   GYNECOLOGIC HISTORY:  No LMP recorded (lmp unknown). Patient is postmenopausal. Menarche age 23, first live birth age 53. She is GX P4. Her periods have never been regular. She had a period in June 2018 but not for 4 months before that. She took birth control for some years remotely with no complications. She is status post bilateral tubal ligation.   SOCIAL HISTORY:  The patient's husband owns and runs a food truck.. They serve mostly [place] food. 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 Her husband [name] (goes by "[name]") is originally from [name]. The patient has two children from her first marriage. Her son [name] "[name], who is 50 years old as of Oct 2021, was previously in the [employer], but now is taking courses at [employer] and  her 81 y/o daughter [name], who was studying psychology at [name] before her optic pathway germinoma, which has left her 97% blind.  She now runs a massage service called "[employer]." Together with [name] the patient has two other children who are 32 ([employer] scholarship, doing premed) and 60 as of October 2020    ADVANCED DIRECTIVES: In the absence of any documents to the contrary the patient's husband is her healthcare power of attorney   HEALTH MAINTENANCE: Social History   Tobacco Use   Smoking status: Never   Smokeless  tobacco: Never  Vaping Use   Vaping Use: Never used  Substance Use Topics   Alcohol use: No   Drug use: No     Colonoscopy: 02/01/2020  IEP:PIRJ 2018  Bone density:Never   Allergies  Allergen Reactions   Carboplatin Other (See Comments)    Red, blotchy, hot    Current Outpatient Medications  Medication Sig Dispense Refill   anastrozole (ARIMIDEX) 1 MG tablet TAKE 1 TABLET  BY MOUTH DAILY. 90 tablet 4   COLLAGEN MATRIX MESHED, PORC, EX Apply 1 each topically every other day. Collagen Wound Packing every other day     gabapentin (NEURONTIN) 100 MG capsule TAKE 2 CAPSULES BY MOUTH IN THE MORNING AND 2 CAPSULES IN THE AFTERNOON 240 capsule 3   gabapentin (NEURONTIN) 300 MG capsule Take 3 capsules (900 mg total) by mouth at bedtime. 180 capsule 4   lidocaine-prilocaine (EMLA) cream APPLY TO AFFECTED AREA ONCE AS DIRECTED (Patient taking differently: Apply 1 application topically daily as needed (prior to lab draws/port flushes).) 30 g 3   olaparib (LYNPARZA) 150 MG tablet Take 1 tablet (150 mg total) by mouth 2 (two) times daily. Swallow whole. May take with food to decrease nausea and vomiting.Start May 23, 2020 120 tablet 6   potassium chloride SA (KLOR-CON) 20 MEQ tablet TAKE 1 TABLET BY MOUTH ONCE A DAY (Patient taking differently: Take 20 mEq by mouth at bedtime.) 30 tablet 2   senna-docusate (SENOKOT-S) 8.6-50 MG tablet Take 2 tablets by mouth at bedtime.     sodium hypochlorite (DAKIN'S 1/2 STRENGTH) external solution Moisten gauze with Dakin's solution then wring out leaving gauze damp not saturated before packing into the wound bed lightly as directed 473 mL 3   traMADol (ULTRAM) 50 MG tablet TAKE 1 TO 2 TABLETS BY MOUTH EVERY 6 HOURS AS NEEDED. 120 tablet 0   No current facility-administered medications for this visit.    OBJECTIVE: White woman in no acute distress  Vitals:   09/30/20 0844  BP: 121/86  Pulse: 89  Resp: 18  Temp: 97.7 F (36.5 C)  SpO2: 98%       Body  mass index is 41.19 kg/m.   Filed Weights   09/30/20 0844  Weight: 266 lb 14.4 oz (121.1 kg)     ECOG FS:1 - Symptomatic but completely ambulatory  Sclerae unicteric, EOMs intact Wearing a mask No cervical or supraclavicular adenopathy Lungs no rales or rhonchi Heart regular rate and rhythm Abd soft, nontender, positive bowel sounds MSK no focal spinal tenderness, no upper extremity lymphedema Neuro: nonfocal, well oriented, appropriate affect Breasts: She is status post bilateral mastectomy.  This chest wall wound on the left is greatly improved and is imaged below.  Left chest wall 09/30/2020    Left chest wall 08/13/2020 Left chest wall 05/23/2020     LAB RESULTS:  CMP     Component Value Date/Time   NA 140 08/13/2020 1408   NA 140 01/07/2017 1342   K 3.4 (L) 08/13/2020 1408   K 3.5 01/07/2017 1342   CL 104 08/13/2020 1408   CO2 25 08/13/2020 1408   CO2 27 01/07/2017 1342   GLUCOSE 176 (H) 08/13/2020 1408   GLUCOSE 148 (H) 01/07/2017 1342   BUN 7 08/13/2020 1408   BUN 6.2 (L) 01/07/2017 1342   CREATININE 0.82 08/13/2020 1408   CREATININE 0.79 12/28/2019 1322   CREATININE 0.7 01/07/2017 1342   CALCIUM 9.2 08/13/2020 1408   CALCIUM 9.4 01/07/2017 1342   PROT 7.0 08/13/2020 1408   PROT 7.0 01/07/2017 1342   ALBUMIN 3.4 (L) 08/13/2020 1408   ALBUMIN 3.7 01/07/2017 1342   AST 14 (L) 08/13/2020 1408   AST 20 12/28/2019 1322   AST 23 01/07/2017 1342   ALT 14 08/13/2020 1408   ALT 19 12/28/2019 1322   ALT 24 01/07/2017 1342   ALKPHOS 100 08/13/2020 1408   ALKPHOS 105 01/07/2017 1342   BILITOT 0.7 08/13/2020 1408  BILITOT 0.9 12/28/2019 1322   BILITOT 0.84 01/07/2017 1342   GFRNONAA >60 08/13/2020 1408   GFRNONAA >60 12/28/2019 1322   GFRAA >60 05/23/2019 1921   GFRAA >60 10/13/2018 1346    No results found for: [name], A1GS, A2GS, BETS, BETA2SER, GAMS, MSPIKE, SPEI  No results found for: Nils Pyle,  [facility]  Lab Results  Component Value Date   WBC 3.7 (L) 09/30/2020   NEUTROABS 2.0 09/30/2020   HGB 12.0 09/30/2020   HCT 36.5 09/30/2020   MCV 88.6 09/30/2020   PLT 190 09/30/2020      Chemistry      Component Value Date/Time   NA 140 08/13/2020 1408   NA 140 01/07/2017 1342   K 3.4 (L) 08/13/2020 1408   K 3.5 01/07/2017 1342   CL 104 08/13/2020 1408   CO2 25 08/13/2020 1408   CO2 27 01/07/2017 1342   BUN 7 08/13/2020 1408   BUN 6.2 (L) 01/07/2017 1342   CREATININE 0.82 08/13/2020 1408   CREATININE 0.79 12/28/2019 1322   CREATININE 0.7 01/07/2017 1342      Component Value Date/Time   CALCIUM 9.2 08/13/2020 1408   CALCIUM 9.4 01/07/2017 1342   ALKPHOS 100 08/13/2020 1408   ALKPHOS 105 01/07/2017 1342   AST 14 (L) 08/13/2020 1408   AST 20 12/28/2019 1322   AST 23 01/07/2017 1342   ALT 14 08/13/2020 1408   ALT 19 12/28/2019 1322   ALT 24 01/07/2017 1342   BILITOT 0.7 08/13/2020 1408   BILITOT 0.9 12/28/2019 1322   BILITOT 0.84 01/07/2017 1342       No results found for: LABCA2  No components found for: [id number]  No results for input(s): INR in the last 168 hours.  Urinalysis    Component Value Date/Time   COLORURINE YELLOW 12/05/2019 1154   APPEARANCEUR CLOUDY (A) 12/05/2019 1154   LABSPEC 1.024 12/05/2019 1154   LABSPEC 1.025 11/23/2016 1251   PHURINE 5.0 12/05/2019 1154   GLUCOSEU NEGATIVE 12/05/2019 1154   GLUCOSEU Negative 11/23/2016 1251   HGBUR SMALL (A) 12/05/2019 1154   BILIRUBINUR NEGATIVE 12/05/2019 1154   BILIRUBINUR Negative 11/23/2016 1251   KETONESUR NEGATIVE 12/05/2019 1154   PROTEINUR NEGATIVE 12/05/2019 1154   UROBILINOGEN 0.2 11/23/2016 1251   NITRITE NEGATIVE 12/05/2019 1154   LEUKOCYTESUR NEGATIVE 12/05/2019 1154   LEUKOCYTESUR Negative 11/23/2016 1251    STUDIES: No results found.   ELIGIBLE FOR AVAILABLE RESEARCH PROTOCOL: no   ASSESSMENT: 50 y.o. [place] woman status post left breast upper outer quadrant  and left axillary lymph node biopsy 06/19/2016, both positive for a clinical T2-T3 N1, stage IIIB-C invasive ductal carcinoma, grade 3, triple negative, with an MIB-1 of 90%  (1) genetics testing 08/04/2016 showed a variant of uncertain significance in the BRCA2 namely c.[id number]>A (p.[id number]). 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 Additional testing of the patient's mother and maternal aunt is pending. Otherwise [facility] Common Hereditary Cancers Panel found no deleterious mutations in APC, ATM, AXIN2, BARD1, BMPR1A, BRCA1, BRCA2, BRIP1, CDH1, CDKN2A, CHEK2, CTNNA1, DICER1, EPCAM, GREM1, HOXB13, KIT, MEN1, MLH1, MSH2, MSH3, MSH6, MUTYH, NBN, NF1, NTHL1, PALB2, PDGFRA, PMS2, POLD1, POLE, PTEN, RAD50, RAD51C, RAD51D, SDHA, SDHB, SDHC, SDHD, SMAD4, SMARCA4, STK11, TP53, TSC1, TSC2, and VHL.  (2) neoadjuvant chemotherapy consisting of doxorubicin and cyclophosphamide in dose dense fashion 4 completed 09/10/2016 followed by paclitaxel weekly 12 given with carboplatin   (a) cycle 4 of cyclophosphamide and doxorubicin was delayed 10  days and dose decreased 10% because of febrile neutropenia after cycle 3  (b) cycle 6 of Paclitaxel and Carboplatin delayed due to neutropenia, therefore Granix added to Wednesday, Thursday, Friday following chemotherapy days  (3) status post left lumpectomy and left axillary sentinel lymph node sampling 01/27/2017 with pathology showing a complete pathologic response (ypT0 ypN0);   (a) 5 left axillary lymph nodes removed  (4) adjuvant radiation 03/11/2017-04/21/2017  Site/dose:   1. Left breast, 2 Gy in 25 fractions for a total dose of 50 Gy                      2. 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  (8) status post bilateral mastectomies 01/09/2020 showing  (a) on the right, no evidence of malignancy  (b) on the left, a pT2 pNX, stage IIB invasive ductal carcinoma, grade 3, with ample margins   (c) repeat prognostic panel on the left again weakly estrogen receptor positive, HER2 negative, progesterone receptor negative  (9) adjuvant therapy with cyclophosphamide, methotrexate, fluorouracil (CMF) given every 21 days x 8, started 02/19/2020  (a) cycle 2 and subsequent cycles held due to surgical wound dehiscence  (10) left chest wall wound dehiscence following mastectomy January 2022.   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  ERV B2 and PI K3 CA were negative, and the microsatellite status was stable, with low mutational burden (4/MB)  (12) anastrozole started 04/18/2020   (A) olaparib added 08/13/2020 and 150 mg once a day  (B) dose increased to 250 mg twice daily as of 09/30/2020   PLAN: Sharde is now getting close to a year out from her bilateral mastectomies, with no evidence of disease activity.  This is favorable.  She is tolerating anastrozole well except for problems with vaginal dryness.   I am referring her to physical therapy regarding that  She tolerated very low-dose olaparib well and we are increasing the dose today 250 mg twice daily.  She will return to see me in a month and at that time we will try to increase the dose further.  If she is doing absolutely perfectly with no concerns we will change that to a virtual visit  I have encouraged her to continue to exercise as much as possible.  She is finally very encouraged regarding wound healing and she hopes to cadaver tissue will get her over the final hump within the next 2 months.  Anticipates she will be undergoing her bilateral salpingo-oophorectomy very late this year or early next year  She  knows to call for any other issue admittable before the next visit.  Total encounter time 30 minutes.*  Total encounter time 35 minutes.*   Navada Osterhout, [name], MD  09/30/20 9:02 AM Medical Oncology and Hematology [facility] [place], [place] [address] Tel. [phone]    Fax. [phone]   I, Wilburn Mylar, am acting as scribe for Dr. [name]. Cherree Conerly.  I, Lurline Del MD, have reviewed the above documentation for accuracy and completeness, and I agree with the above.   *Total Encounter Time as defined by the Centers for Medicare and Medicaid Services includes, in addition to the face-to-face time of a patient visit (documented in the note above) non-face-to-face time: obtaining and reviewing outside history, ordering and reviewing medications, tests or procedures, care coordination (communications with other health care professionals or caregivers) and documentation in the medical record.

## 2020-09-30 ENCOUNTER — Inpatient Hospital Stay (HOSPITAL_BASED_OUTPATIENT_CLINIC_OR_DEPARTMENT_OTHER): Payer: Medicaid Other | Admitting: Oncology

## 2020-09-30 ENCOUNTER — Inpatient Hospital Stay: Payer: Medicaid Other | Attending: Oncology

## 2020-09-30 ENCOUNTER — Other Ambulatory Visit (HOSPITAL_COMMUNITY): Payer: Self-pay

## 2020-09-30 ENCOUNTER — Other Ambulatory Visit: Payer: Self-pay

## 2020-09-30 VITALS — BP 121/86 | HR 89 | Temp 97.7°F | Resp 18 | Ht 67.5 in | Wt 266.9 lb

## 2020-09-30 DIAGNOSIS — C50412 Malignant neoplasm of upper-outer quadrant of left female breast: Secondary | ICD-10-CM | POA: Diagnosis not present

## 2020-09-30 DIAGNOSIS — Z923 Personal history of irradiation: Secondary | ICD-10-CM | POA: Diagnosis not present

## 2020-09-30 DIAGNOSIS — F419 Anxiety disorder, unspecified: Secondary | ICD-10-CM | POA: Insufficient documentation

## 2020-09-30 DIAGNOSIS — Z9221 Personal history of antineoplastic chemotherapy: Secondary | ICD-10-CM | POA: Diagnosis not present

## 2020-09-30 DIAGNOSIS — Z171 Estrogen receptor negative status [ER-]: Secondary | ICD-10-CM | POA: Diagnosis not present

## 2020-09-30 DIAGNOSIS — Z79899 Other long term (current) drug therapy: Secondary | ICD-10-CM | POA: Diagnosis not present

## 2020-09-30 DIAGNOSIS — Z1501 Genetic susceptibility to malignant neoplasm of breast: Secondary | ICD-10-CM

## 2020-09-30 DIAGNOSIS — K429 Umbilical hernia without obstruction or gangrene: Secondary | ICD-10-CM

## 2020-09-30 DIAGNOSIS — Z9013 Acquired absence of bilateral breasts and nipples: Secondary | ICD-10-CM | POA: Diagnosis not present

## 2020-09-30 DIAGNOSIS — Z6841 Body Mass Index (BMI) 40.0 and over, adult: Secondary | ICD-10-CM

## 2020-09-30 DIAGNOSIS — C50912 Malignant neoplasm of unspecified site of left female breast: Secondary | ICD-10-CM

## 2020-09-30 DIAGNOSIS — Z1509 Genetic susceptibility to other malignant neoplasm: Secondary | ICD-10-CM

## 2020-09-30 LAB — CBC WITH DIFFERENTIAL/PLATELET
Abs Immature Granulocytes: 0.01 10*3/uL (ref 0.00–0.07)
Basophils Absolute: 0 10*3/uL (ref 0.0–0.1)
Basophils Relative: 1 %
Eosinophils Absolute: 0.1 10*3/uL (ref 0.0–0.5)
Eosinophils Relative: 3 %
HCT: 36.5 % (ref 36.0–46.0)
Hemoglobin: 12 g/dL (ref 12.0–15.0)
Immature Granulocytes: 0 %
Lymphocytes Relative: 35 %
Lymphs Abs: 1.3 10*3/uL (ref 0.7–4.0)
MCH: 29.1 pg (ref 26.0–34.0)
MCHC: 32.9 g/dL (ref 30.0–36.0)
MCV: 88.6 fL (ref 80.0–100.0)
Monocytes Absolute: 0.2 10*3/uL (ref 0.1–1.0)
Monocytes Relative: 6 %
Neutro Abs: 2 10*3/uL (ref 1.7–7.7)
Neutrophils Relative %: 55 %
Platelets: 190 10*3/uL (ref 150–400)
RBC: 4.12 MIL/uL (ref 3.87–5.11)
RDW: 15 % (ref 11.5–15.5)
WBC: 3.7 10*3/uL — ABNORMAL LOW (ref 4.0–10.5)
nRBC: 0 % (ref 0.0–0.2)

## 2020-09-30 LAB — COMPREHENSIVE METABOLIC PANEL
ALT: 15 U/L (ref 0–44)
AST: 16 U/L (ref 15–41)
Albumin: 3.6 g/dL (ref 3.5–5.0)
Alkaline Phosphatase: 91 U/L (ref 38–126)
Anion gap: 12 (ref 5–15)
BUN: 8 mg/dL (ref 6–20)
CO2: 23 mmol/L (ref 22–32)
Calcium: 9.4 mg/dL (ref 8.9–10.3)
Chloride: 105 mmol/L (ref 98–111)
Creatinine, Ser: 0.83 mg/dL (ref 0.44–1.00)
GFR, Estimated: >60 mL/min (ref >60)
Glucose, Bld: 125 mg/dL — ABNORMAL HIGH (ref 70–99)
Potassium: 3.9 mmol/L (ref 3.5–5.1)
Sodium: 140 mmol/L (ref 135–145)
Total Bilirubin: 0.8 mg/dL (ref 0.3–1.2)
Total Protein: 7.2 g/dL (ref 6.5–8.1)

## 2020-09-30 MED ORDER — GABAPENTIN 300 MG PO CAPS
900.0000 mg | ORAL_CAPSULE | Freq: Every day | ORAL | 4 refills | Status: DC
Start: 2020-09-30 — End: 2021-07-30
  Filled 2020-09-30 – 2020-10-31 (×3): qty 180, 60d supply, fill #0
  Filled 2021-01-05 – 2021-01-07 (×2): qty 180, 60d supply, fill #1
  Filled 2021-02-27: qty 180, 60d supply, fill #2
  Filled 2021-05-09: qty 180, 60d supply, fill #3
  Filled 2021-06-09: qty 180, 60d supply, fill #4

## 2020-09-30 MED ORDER — OLAPARIB 150 MG PO TABS
150.0000 mg | ORAL_TABLET | Freq: Two times a day (BID) | ORAL | 6 refills | Status: DC
  Filled 2020-09-30: qty 120, 60d supply, fill #0

## 2020-09-30 MED ORDER — ANASTROZOLE 1 MG PO TABS
ORAL_TABLET | Freq: Every day | ORAL | 4 refills | Status: DC
Start: 2020-09-30 — End: 2021-01-10
  Filled 2020-09-30: qty 90, fill #0
  Filled 2020-12-06: qty 90, 90d supply, fill #0

## 2020-09-30 MED ORDER — GABAPENTIN 100 MG PO CAPS
ORAL_CAPSULE | ORAL | 3 refills | Status: DC
  Filled 2020-09-30 – 2020-10-11 (×2): qty 240, 60d supply, fill #0
  Filled 2020-12-06: qty 240, 60d supply, fill #1
  Filled 2021-02-27: qty 240, 60d supply, fill #2
  Filled 2021-05-09: qty 240, 60d supply, fill #3

## 2020-10-01 ENCOUNTER — Other Ambulatory Visit (HOSPITAL_COMMUNITY): Payer: Self-pay

## 2020-10-01 ENCOUNTER — Other Ambulatory Visit: Payer: Self-pay

## 2020-10-01 ENCOUNTER — Encounter (HOSPITAL_BASED_OUTPATIENT_CLINIC_OR_DEPARTMENT_OTHER): Payer: Medicaid Other | Admitting: Internal Medicine

## 2020-10-01 DIAGNOSIS — T8131XD Disruption of external operation (surgical) wound, not elsewhere classified, subsequent encounter: Secondary | ICD-10-CM | POA: Diagnosis not present

## 2020-10-01 DIAGNOSIS — C50912 Malignant neoplasm of unspecified site of left female breast: Secondary | ICD-10-CM

## 2020-10-01 MED ORDER — OLAPARIB 150 MG PO TABS
150.0000 mg | ORAL_TABLET | Freq: Two times a day (BID) | ORAL | 6 refills | Status: DC
  Filled 2020-10-01 – 2020-10-03 (×2): qty 60, 30d supply, fill #0

## 2020-10-02 ENCOUNTER — Other Ambulatory Visit (HOSPITAL_COMMUNITY): Payer: Self-pay

## 2020-10-02 NOTE — Progress Notes (Signed)
ANGLIA, BLAKLEY ([id number]) Visit Report for 09/27/2020 Arrival Information Details Patient Name: Date of Service: [name] [name] 09/27/2020 9:00 Sarah M Medical Record Number: [id number] Patient Account Number: [id number] Date of Birth/Sex: Treating RN: [birth year] (50 y.o. [name], [name] Primary Care Savien Mamula: [name] Other Clinician: Referring Janyiah Silveri: Treating Salaam Battershell/Extender: [name] in Treatment: 24 Visit Information History Since Last Visit Added or deleted any medications: No Patient Arrived: Ambulatory Any new allergies or adverse reactions: No Arrival Time: 09:14 Had Sarah fall or experienced change in No Accompanied By: self activities of daily living that may affect Transfer Assistance: None risk of falls: Patient Identification Verified: Yes Signs or symptoms of abuse/neglect since last visito No Secondary Verification Process Completed: Yes Hospitalized since last visit: No Patient Requires Transmission-Based Precautions: No Implantable device outside of the clinic excluding No Patient Has Alerts: No cellular tissue based products placed in the center since last visit: Has Dressing in Place as Prescribed: Yes Pain Present Now: No Electronic Signature(s) Signed: 10/01/2020 12:59:59 PM By: Sandre Kitty Entered By: Sandre Kitty on 09/27/2020 09:14:59 -------------------------------------------------------------------------------- Encounter Discharge Information Details Patient Name: Date of Service: [name] [name] 09/27/2020 9:00 Sarah M Medical Record Number: [id number] Patient Account Number: [id number] Date of Birth/Sex: Treating RN: November 08, 1970 (50 y.o. [name] Primary Care Luis Nickles: [name] Other Clinician: Referring Lilliauna Van: Treating Roniya Tetro/Extender: [name] in Treatment: 24 Encounter Discharge Information Items Discharge Condition:  Stable Ambulatory Status: Ambulatory Discharge Destination: Home Transportation: Private Auto Accompanied By: self Schedule Follow-up Appointment: Yes Clinical Summary of Care: Electronic Signature(s) Signed: 09/27/2020 2:24:37 PM By: Deon Pilling RN, BSN Entered By: Deon Pilling on 09/27/2020 09:32:16 -------------------------------------------------------------------------------- Negative Pressure Wound Therapy Maintenance (NPWT) Details Patient Name: Date of Service: [name], [name] [name] 09/27/2020 9:00 Sarah M Medical Record Number: [id number] Patient Account Number: [id number] Date of Birth/Sex: Treating RN: 02-Mar-1970 (50 y.o. [name] Primary Care Kerianna Rawlinson: [name] Other Clinician: Referring Caliph Borowiak: Treating Dandrea Widdowson/Extender: [name] in Treatment: 24 NPWT Maintenance Performed for: Wound #1 Left Chest Performed By: Deon Pilling, RN Type: VAC System Coverage Size (sq cm): 8.7 Pressure Type: Constant Pressure Setting: 125 mmHG Drain Type: None Primary Contact: Non-Adherent Sponge/Dressing Type: Foam, Black Date Initiated: 08/21/2020 Dressing Removed: Yes Quantity of Sponges/Gauze Removed: 1 Canister Changed: Yes Dressing Reapplied: Yes Quantity of Sponges/Gauze Inserted: 1 Respones T Treatment: o tolerated well Days On NPWT : 38 Notes santyl applied under wound vac. Electronic Signature(s) Signed: 09/27/2020 2:24:37 PM By: Deon Pilling RN, BSN Entered By: Deon Pilling on 09/27/2020 09:31:41 -------------------------------------------------------------------------------- Patient/Caregiver Education Details Patient Name: Date of Service: Sarah [name] 9/23/2022andnbsp9:00 Sarah M Medical Record Number: [id number] Patient Account Number: [id number] Date of Birth/Gender: Treating RN: 1970-01-10 (50 y.o. [name] Primary Care Physician: [name] Other Clinician: Referring  Physician: Treating Physician/Extender: [name] in Treatment: 24 Education Assessment Education Provided To: Patient Education Topics Provided Wound/Skin Impairment: Handouts: Skin Care Do's and Dont's Methods: Explain/Verbal Responses: State content correctly Electronic Signature(s) Signed: 09/27/2020 2:24:37 PM By: Deon Pilling RN, BSN Entered By: Deon Pilling on 09/27/2020 09:32:06 -------------------------------------------------------------------------------- Wound Assessment Details Patient Name: Date of Service: [name] [name] 09/27/2020 9:00 Sarah M Medical Record Number: [id number] Patient Account Number: [id number] Date of Birth/Sex: Treating RN: [birth year] (50 y.o. [name] Primary Care Sri Clegg:  [name] Other Clinician: Referring Izayah Miner: Treating Yamile Roedl/Extender: [name] in Treatment: 24 Wound Status Wound Number: 1 Primary Etiology: Open Surgical Wound Wound Location: Left Chest Secondary Etiology: Necrosis (Radiation) Wounding Event: Surgical Injury Wound Status: Open Date Acquired: 01/16/2020 Weeks Of Treatment: 24 Clustered Wound: No Wound Measurements Length: (cm) 3 Width: (cm) 2.9 Depth: (cm) 1 Area: (cm) 6.833 Volume: (cm) 6.833 % Reduction in Area: -7.4% % Reduction in Volume: 2.4% Wound Description Classification: Full Thickness Without Exposed Support Structu Exudate Amount: Medium Exudate Type: Serosanguineous Exudate Color: red, brown res Electronic Signature(s) Signed: 10/01/2020 12:59:59 PM By: Sandre Kitty Signed: 10/02/2020 5:46:28 PM By: Baruch Gouty RN, BSN Entered By: Sandre Kitty on 09/27/2020 09:16:46 -------------------------------------------------------------------------------- Vitals Details Patient Name: Date of Service: [name] [name] 09/27/2020 9:00 Sarah M Medical Record Number: [id number] Patient Account  Number: [id number] Date of Birth/Sex: Treating RN: 1970-10-27 (50 y.o. [name] Primary Care Lindalou Soltis: [name] Other Clinician: Referring Smith Potenza: Treating Tiffeny Minchew/Extender: [name] in Treatment: 24 Vital Signs Time Taken: 09:15 Temperature (F): 98.3 Height (in): 67 Pulse (bpm): 80 Weight (lbs): 257 Respiratory Rate (breaths/min): 16 Body Mass Index (BMI): 40.2 Blood Pressure (mmHg): 139/76 Reference Range: 80 - 120 mg / dl Electronic Signature(s) Signed: 10/01/2020 12:59:59 PM By: Sandre Kitty Entered By: Sandre Kitty on 09/27/2020 09:16:34

## 2020-10-02 NOTE — Progress Notes (Signed)
[name], [name] ([id number]) Visit Report for 10/01/2020 Debridement Details Patient Name: Date of Service: [name] [name] 10/01/2020 7:30 Sarah M Medical Record Number: [id number] Patient Account Number: [id number] Date of Birth/Sex: Treating RN: Jul 09, 1970 (50 y.o. [name], [name] Primary Care Provider: Karle Khan Other Clinician: Referring Provider: Treating Provider/Extender: [name] in Treatment: 25 Debridement Performed for Assessment: Wound #1 Left Chest Performed By: Physician [name]., MD Debridement Type: Debridement Level of Consciousness (Pre-procedure): Awake and Alert Pre-procedure Verification/Time Out Yes - 08:03 Taken: Start Time: 08:04 Pain Control: Other : Benzocaine T Area Debrided (L x W): otal 2.7 (cm) x 2.8 (cm) = 7.56 (cm) Tissue and other material debrided: Non-Viable, Slough, Subcutaneous, Slough Level: Skin/Subcutaneous Tissue Debridement Description: Excisional Instrument: Curette Bleeding: Minimum Hemostasis Achieved: Pressure End Time: 08:07 Response to Treatment: Procedure was tolerated well Level of Consciousness (Post- Awake and Alert procedure): Post Debridement Measurements of Total Wound Length: (cm) 2.7 Width: (cm) 2.8 Depth: (cm) 0.7 Volume: (cm) 4.156 Character of Wound/Ulcer Post Debridement: Stable Post Procedure Diagnosis Same as Pre-procedure Electronic Signature(s) Signed: 10/01/2020 4:21:38 PM By: Sarah Ham MD Signed: 10/02/2020 5:21:18 PM By: Sarah Hammock RN Entered By: [name] on 10/01/2020 08:11:04 -------------------------------------------------------------------------------- HPI Details Patient Name: Date of Service: [name] V [name] 10/01/2020 7:30 Sarah M Medical Record Number: [id number] Patient Account Number: [id number] Date of Birth/Sex: Treating RN: February 04, 1970 (50 y.o. [name], [name] Primary Care Provider: Karle Khan Other  Clinician: Referring Provider: Treating Provider/Extender: [name] in Treatment: 25 History of Present Illness HPI Description: After canADMISSION 04/09/2020 [name] 50 year old woman who is history of breast cancer in the left breast goes back to 2018. He was found to have Sarah mass in her left breast. [name] and then subsequent chemo that she did not tolerate very well. She had seed implants placed but then underwent external beam Sarah radiation in the spring 2019. 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 Her adjunctive radiation was from 03/11/2017 through 04/21/2017. Unfortunately she had Sarah left breast biopsy on 11/24/2019 and it was again positive for an invasive ductal carcinoma grade 3 he is to resume at receptor 80% positive. HER-2 negative progesterone negative. She opted for bilateral mastectomies which were done on 01/09/2020 on the right with no evidence of malignancy on the left stage IIb invasive ductal carcinoma. She again received chemotherapy but she did not tolerate it because of wound dehiscence and low platelet counts according to the patient. Unfortunately her mastectomy wound dehisced on the left breast. She was taken back to the OR by [name] of general surgery on 33/24/22 and she underwent Sarah debridement and subsequent attempt at closure. She had been previously treated with Augmentin for suspected underlying infection. Once again the wound has dehisced in the mid aspect. It is roughly 1 cm deep there is tunnels both medially and laterally she. She still has sutures in place. Still describes constant pain drainage. She also has Sarah drain still in place that drains about 10 cc/day. Currently she is changing ABD pad several times Sarah day 4/12; patient went back to see [name] who  remove most of the stitches and debrided her wound. She has been using silver alginate culture I did last time which was Sarah PCR  culture showed Peptostreptococcus, Enterococcus faecalis and Staphylococcus epidermidis which is of questionable significance. [name] that the sutures have been removed. There are undermining areas superior medial superior lateral and Sarah deep tunneling area in the inferior part of the wound. There is no obvious purulence. I started her on Augmentin on 04/12/2020 and I will likely extend this for another week AMAZINGLY; she is already been approved for HBO and will start today. [name] Echocardiogram which I reviewed from 2019 showed Sarah normal ejection fraction.. I went over side effects of HBO with the patient and answered any of her questions. Her husband was present 5/3; [name]. She went to see [name] about 2-1/2 weeks ago according to the patient he can remove some of the surrounding debris. She will see him again later this week or early next week. She has been using silver alginate. Sarah lot of this is problematic since she has Medicaid which traditionally does not cover wound care supplies although it traditionally does not cover HBO either Her wound actually looks quite Sarah bit better than the last time I saw this better looking surface healthy looking tissue. She has tunnels at 810 2 in 6 the deepest of these is at 2 at 2.2 cm there is no exposed bone. 5/10; the patient was coming in for nurse visit today but requested that I see the wound because of surface discoloration. She states the Iodoflex makes the surface of this turned "white". She is not really complaining of undue tenderness. She is changing this every second day. The goal of this had been to get the surface of the wound to Sarah vibrant red color and then potentially look at changing to Sarah silver collagen-based dressing, Puraply, and  then perhaps an alternative skin substituteo Derma vest. The patient tells me she has an appoint with her oncologist [name] on the 19th. She apparently think she is going to get another PET scan. Based on this I would like his comment about whether there is possibility of any malignancy in the wound itself or just underneath it. Up to now we have not really thought that was true 5/17; nice improvement in the chest wound. Less depth. We have been using Iodoflex to clean off the wound surface this seems to have done nicely. Patient went to see [name] 2 days ago who has referred her to see Dr. Claudia Desanctis. I certainly agree with the thought the surface of the wound looks Sarah lot better now and I think consideration of plastic surgery is certainly something the patient should discuss. I have talked to the patient about this today and I certainly support Sarah conversation with Dr. Claudia Desanctis. I also wondered about is there any residual cancer in this. [name] apparently did not seem to think so per the patient although I do not see that in the text conversations we have had. I also wonder about Sarah skin substitute if her version of Medicare [[employer]] would cover this. [name] is apparently considering starting her on [place]. Patient is already very scared about the side effects but she will discuss this with [name] on 5/19 5/31; patient's wound about the same in terms of size she still has the tunneling area to the left. Surface of the wound looks Sarah lot better there has been some filling in the granulation I have been included on serial text between Dr. Callie Fielding  surgery] Dr. Ruffin Frederick oncology] and Dr. Para Skeans surgery]. I think there is not Sarah lot of thought that the patient has recurrence of her cancer her MRI was indeterminant but [name] did not feel that there was any reason to think she has Sarah major recurrence and he was not in favor of doing Sarah dedicated breast  MRI. Dr. Claudia Desanctis wants to go ahead with Sarah flap closure I did it. I have not reviewed his note but per the patient [name] latissimus dorsi myocutaneous flap or I think Sarah skin flap from her abdomen. I have talked to the patient I think it is time to attempt this. If I can continue hyperbarics surrounding the flap then we will attempt to do this 6/14; patient using Prisma as the primary dressing here. She has completed hyperbarics and did very well. Her wound is responded nicely. The depth of this and the condition of the granulation tissue looks Sarah lot better. She has undermining areas at 2 and 10:00 both of these seem to be better as well. She is scheduled for flap closure on 7/11 by Dr. Claudia Desanctis 6/29; the patient continues to use Prisma wet-to-dry. Her graft is scheduled for 7/11 by Dr. Claudia Desanctis. We are looking at getting her additional hyperbaric treatment which I think might help with the likelihood of graft survival in this difficult irradiated area 7/21; patient returns to clinic to reinitiate HBO after her surgical procedure by Dr. Claudia Desanctis. On 07/15/2020 she had debridement of left chest wound and Sarah rotational flap closure. She also had excision of right excess axillary tissue. Pathology did not show any malignancy and follow-up Dr. Claudia Desanctis saw the patient yesterday. He noted Sarah 2 cm area of wound dehiscence. Recommended she return for hyperbaric oxygen and she returns today for her first treatment. Follow-up with him in 2 weeks. We were not actually going to see the patient today her appointment to be evaluated was Monday however she was concerned about Sarah "fishy odor". She is also noted some drainage. Not specifically dressing in the wound area but covering with Adaptic 7/25; I am seeing this wound and follow-up. So far her culture is polymicrobial not growing any specific anything specific nevertheless with the Augmentin and the silver alginate her odor is Sarah lot better. Not as much periwound tenderness.  Dimensions measuring 2.7 x 3.3 x 1.6. She has undermining from 10-1 o'clock of about 1 and half centimeters. Necrotic fat on the surface of this... 08/13/2020 upon evaluation today patient appears to be doing decently well in regard to her wound all things considered. Obviously this is much more open than what was hoped for based on the fact that she had Sarah flap performed which was supposed to completely close this wound. Nonetheless currently she tells me that she is not really having any significant pain fortunately the drainage is improved compared to last time there is still some evidence of odor as far as the drainage is concerned but is definitely not as purulent as what was previously noted. With that being said I do believe that there are quite Sarah few sutures which are quite embedded. Nonetheless I believe this needs to be removed. In fact if they are not I am afraid this is again actually grow into the skin and that is can to be even bigger issue. Some of them even so at this point appeared that there could be difficult to even get out. 8/17; this is the first time I am  seeing this in about 3 weeks. The patient had stop by my office yesterday to discuss after completing hyperbaric therapy. Based on what she said I was expecting the worst today however apparently with dressing changes and perhaps debridement last week the surface of the wound actually looks better than what I was expecting. There is considerable depth although the last time I saw this the base of the wound was necrotic there is still some necrotic tissue on the surface of the wound bed however in general the rest the tissue looks healthy even with some bleeding. She has been using cleaning the wound with Dakin's and then applying Prisma and then backing Dakin's wet-to-dry. This seems to work to clean the wound surface up. They also ordered Sarah wound VAC and I discussed this with her in depth today She is not systemically unwell. She  is not running Sarah fever there is no odor. She talks about pain over the lower part of the flap to touch. This does not appear to be related to movement of her shoulder. 8/23; the patient has been using the wound VAC for 1 week. Already Sarah remarkable improvement and she appears to be tolerating this well we had Santyl under the wound VAC however the surface looks so clean today I am doubtful that is going to be necessary She is also being treated with HBO. This is helped her dramatically. Apparently her current certification will be up on 8/31. She is only had 19 dives and we will certainly need to recertify her up for this. Since I saw her last week I put in Sarah secure text message to Dr. Claudia Desanctis through epic to let him know that we were going ahead with Sarah wound VAC. He did not have any objections to this. He is offering her more surgery however in the eventuality that this does not close 8/30; the patient missed 2 treatments of HBO last week because of nausea. I think she is now on Compazine which seems to have helped. Otherwise she is tolerating HBO well. We are collagen under wound VAC. Direct depth of wound measured at 1.2 cm which I think is probably unchanged however the undermining area is down to 0.5 which I think is an improvement 9/6; patient continues with chemotherapy although she says she is troll of her nausea. We are using collagen with Sarah wound VAC on the remaining wound on her chest. No major improvements in the undermining area from 12-5 currently measuring 0.5 cm. This is no better than last week 9/13; patient continuing with chemotherapy. She completes HBO today. We are using silver collagen under wound VAC. No major improvements however the undermining area seems somewhat better to me. 9/20; we are using silver collagen under the wound VAC and although about 60% of the wound looks healthy. At the base of the wound there is still Sarah very adherent slough. She has been approved for  TheraSkin which I would like to use if we can get the wound bed in Sarah cleaner situation. 9/27; we are using Santyl under the wound VAC. Dimensions are slightly better including depth of 0.7 cm. Slight improvement in length and width the undermining area currently at 12-2 done in itself and improvement measures 0.6 which is not too much of improvement in terms of the probable depth Electronic Signature(s) Signed: 10/01/2020 4:21:38 PM By: Sarah Ham MD Entered By: [name] on 10/01/2020 08:12:17 -------------------------------------------------------------------------------- Physical Exam Details Patient Name: Date of Service: Sarah BDELA Sarah L,  [name] 10/01/2020 7:30 Sarah M Medical Record Number: [id number] Patient Account Number: [id number] Date of Birth/Sex: Treating RN: 12/30/1970 (50 y.o. [name], [name] Primary Care Provider: Karle Khan Other Clinician: Referring Provider: Treating Provider/Extender: [name] in Treatment: 25 Constitutional Sitting or standing Blood Pressure is within target range for patient.. Pulse regular and within target range for patient.Marland Kitchen Respirations regular, non-labored and within target range.. Temperature is normal and within the target range for the patient.Marland Kitchen Appears in no distress. Notes Wound exam; left breast area. Surgical incision close to the anterior axillary line. Still some slough on the surface. The undermining area is not nearly as broad as it once was only at 12-2 o'clock. This undermining depth is 0.6 cm today which is roughly about the same. Length and width of come in Sarah bit. Still using an open curette to remove the fibrinous debris from the surface Electronic Signature(s) Signed: 10/01/2020 4:21:38 PM By: Sarah Ham MD Entered By: [name] on 10/01/2020 08:13:41 -------------------------------------------------------------------------------- Physician Orders Details Patient Name: Date  of Service: [name] V [name] 10/01/2020 7:30 Sarah M Medical Record Number: [id number] Patient Account Number: [id number] Date of Birth/Sex: Treating RN: April 21, 1970 (50 y.o. 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 - Tuesday with Dr. Dellia Nims Return Sarah Nurse Visit: - Vac dressing change Friday Bathing/ Shower/ Hygiene May shower with protection but do not get wound dressing(s) wet. Negative Presssure Wound Therapy Wound Vac to wound continuously at 163m/hg pressure Black Foam - Apply Santyl to wound bed, under foam Wound Treatment Wound #1 - Chest Wound Laterality: Left Cleanser: Wound Cleanser 2 x Per Week/15 Days Discharge Instructions: Cleanse the wound with wound cleanser prior to applying Sarah clean dressing using gauze sponges, not tissue or cotton balls. Prim Dressing: Santyl Ointment 2 x Per Week/15 Days ary Discharge Instructions: Apply nickel thick amount to wound bed under vac foam Electronic Signature(s) Signed: 10/01/2020 4:21:38 PM By: RLinton HamMD Signed: 10/01/2020 4:47:08 PM By: BLorrin JacksonEntered By: BLorrin Jacksonon 10/01/2020 08:10:06 -------------------------------------------------------------------------------- Problem List Details Patient Name: Date of Service: ARoyetta CrochetV [name] 10/01/2020 7:30 Sarah M Medical Record Number: [id number] Account Number: [id number] of  Birth/Sex: Treating RN: [id number](50y.o. 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 10/01/2020 7:30 Sarah M Medical Record Number: [id number] Account Number:  [id number] Date of Birth/Sex: Treating RN: 1970/04/30 (50 y.o. [name], [name] Primary Care Provider: Karle Khan Other Clinician: Referring Provider: Treating Provider/Extender: [name] in Treatment: 25 Subjective History of Present Illness (HPI) After canADMISSION 04/09/2020 [name] 50 year old woman who is history of breast cancer in the left breast goes back to 2018. He was found to have Sarah mass in her left breast. [name] and then subsequent chemo that she did not tolerate very well. She had seed implants placed but then underwent external beam Sarah radiation in the spring 2019. 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 Her  adjunctive radiation was from 03/11/2017 through 04/21/2017. Unfortunately she had Sarah left breast biopsy on 11/24/2019 and it was again positive for an invasive ductal carcinoma grade 3 he is to resume at receptor 80% positive. HER-2 negative progesterone negative. She opted for bilateral mastectomies which were done on 01/09/2020 on the right with no evidence of malignancy on the left stage IIb invasive ductal carcinoma. She again received chemotherapy but she did not tolerate it because of wound dehiscence and low platelet counts according to the patient. Unfortunately her mastectomy wound dehisced on the left breast. She was taken back to the OR by [name] of general surgery on 33/24/22 and she underwent Sarah debridement and subsequent attempt at closure. She had been previously treated with Augmentin for suspected underlying infection. Once again the wound has dehisced in the mid aspect. It is roughly 1 cm deep there is tunnels both medially and laterally she. She still has sutures in place. Still describes constant pain drainage. She also has Sarah drain still in place that drains about 10 cc/day. Currently she is changing ABD pad several times Sarah day 4/12; patient went back to see [name] who remove most of the stitches and debrided her wound. She has been using silver alginate culture I did last time which was Sarah PCR culture showed Peptostreptococcus, Enterococcus faecalis and Staphylococcus epidermidis which is of questionable significance. [name] that the sutures have been removed. There are undermining areas superior medial superior lateral and Sarah deep tunneling area in the inferior part of the wound. There is no obvious purulence. I started her on Augmentin on 04/12/2020 and I will likely extend this for another week AMAZINGLY; she is already been approved for HBO and will start today. [name] Echocardiogram which I reviewed from 2019 showed Sarah normal  ejection fraction.. I went over side effects of HBO with the patient and answered any of her questions. Her husband was present 5/3; [name]. She went to see [name] about 2-1/2 weeks ago according to the patient he can remove some of the surrounding debris. She will see him again later this week or early next week. She has been using silver alginate. Sarah lot of this is problematic since she has Medicaid which traditionally does not cover wound care supplies although it traditionally does not cover HBO either Her wound actually looks quite Sarah bit better than the last time I saw this better looking surface healthy looking tissue. She has tunnels at 810 2 in 6 the deepest of these is at 2 at 2.2 cm there is no exposed bone. 5/10; the patient was coming in for nurse  visit today but requested that I see the wound because of surface discoloration. She states the Iodoflex makes the surface of this turned "white". She is not really complaining of undue tenderness. She is changing this every second day. The goal of this had been to get the surface of the wound to Sarah vibrant red color and then potentially look at changing to Sarah silver collagen-based dressing, Puraply, and then perhaps an alternative skin substituteo Derma vest. The patient tells me she has an appoint with her oncologist [name] on the 19th. She apparently think she is going to get another PET scan. Based on this I would like his comment about whether there is possibility of any malignancy in the wound itself or just underneath it. Up to now we have not really thought that was true 5/17; nice improvement in the chest wound. Less depth. We have been using Iodoflex to clean off the wound surface this seems to have done nicely. Patient went to see [name] 2 days ago who has referred her to see Dr. Claudia Desanctis. I certainly agree with the thought the surface of the wound looks Sarah lot better now and I think  consideration of plastic surgery is certainly something the patient should discuss. I have talked to the patient about this today and I certainly support Sarah conversation with Dr. Claudia Desanctis. I also wondered about is there any residual cancer in this. [name] apparently did not seem to think so per the patient although I do not see that in the text conversations we have had. I also wonder about Sarah skin substitute if her version of Medicare [[employer]] would cover this. [name] is apparently considering starting her on [place]. Patient is already very scared about the side effects but she will discuss this with [name] on 5/19 5/31; patient's wound about the same in terms of size she still has the tunneling area to the left. Surface of the wound looks Sarah lot better there has been some filling in the granulation I have been included on serial text between Dr. Callie Fielding surgery] Dr. Ruffin Frederick oncology] and Dr. Para Skeans surgery]. I think there is not Sarah lot of thought that the patient has recurrence of her cancer her MRI was indeterminant but [name] did not feel that there was any reason to think she has Sarah major recurrence and he was not in favor of doing Sarah dedicated breast MRI. Dr. Claudia Desanctis wants to go ahead with Sarah flap closure I did it. I have not reviewed his note but per the patient [name] latissimus dorsi myocutaneous flap or I think Sarah skin flap from her abdomen. I have talked to the patient I think it is time to attempt this. If I can continue hyperbarics surrounding the flap then we will attempt to do this 6/14; patient using Prisma as the primary dressing here. She has completed hyperbarics and did very well. Her wound is responded nicely. The depth of this and the condition of the granulation tissue looks Sarah lot better. She has undermining areas at 2 and 10:00 both of these seem to be better as well. She is scheduled for flap closure on 7/11 by Dr. Claudia Desanctis 6/29; the  patient continues to use Prisma wet-to-dry. Her graft is scheduled for 7/11 by Dr. Claudia Desanctis. We are looking at getting her additional hyperbaric treatment which I think might help with the likelihood of graft survival in this difficult irradiated area 7/21; patient returns to clinic  to reinitiate HBO after her surgical procedure by Dr. Claudia Desanctis. On 07/15/2020 she had debridement of left chest wound and Sarah rotational flap closure. She also had excision of right excess axillary tissue. Pathology did not show any malignancy and follow-up Dr. Claudia Desanctis saw the patient yesterday. He noted Sarah 2 cm area of wound dehiscence. Recommended she return for hyperbaric oxygen and she returns today for her first treatment. Follow-up with him in 2 weeks. We were not actually going to see the patient today her appointment to be evaluated was Monday however she was concerned about Sarah "fishy odor". She is also noted some drainage. Not specifically dressing in the wound area but covering with Adaptic 7/25; I am seeing this wound and follow-up. So far her culture is polymicrobial not growing any specific anything specific nevertheless with the Augmentin and the silver alginate her odor is Sarah lot better. Not as much periwound tenderness. Dimensions measuring 2.7 x 3.3 x 1.6. She has undermining from 10-1 o'clock of about 1 and half centimeters. Necrotic fat on the surface of this... 08/13/2020 upon evaluation today patient appears to be doing decently well in regard to her wound all things considered. Obviously this is much more open than what was hoped for based on the fact that she had Sarah flap performed which was supposed to completely close this wound. Nonetheless currently she tells me that she is not really having any significant pain fortunately the drainage is improved compared to last time there is still some evidence of odor as far as the drainage is concerned but is definitely not as purulent as what was previously noted. With that  being said I do believe that there are quite Sarah few sutures which are quite embedded. Nonetheless I believe this needs to be removed. In fact if they are not I am afraid this is again actually grow into the skin and that is can to be even bigger issue. Some of them even so at this point appeared that there could be difficult to even get out. 8/17; this is the first time I am seeing this in about 3 weeks. The patient had stop by my office yesterday to discuss after completing hyperbaric therapy. Based on what she said I was expecting the worst today however apparently with dressing changes and perhaps debridement last week the surface of the wound actually looks better than what I was expecting. There is considerable depth although the last time I saw this the base of the wound was necrotic there is still some necrotic tissue on the surface of the wound bed however in general the rest the tissue looks healthy even with some bleeding. She has been using cleaning the wound with Dakin's and then applying Prisma and then backing Dakin's wet-to-dry. This seems to work to clean the wound surface up. They also ordered Sarah wound VAC and I discussed this with her in depth today She is not systemically unwell. She is not running Sarah fever there is no odor. She talks about pain over the lower part of the flap to touch. This does not appear to be related to movement of her shoulder. 8/23; the patient has been using the wound VAC for 1 week. Already Sarah remarkable improvement and she appears to be tolerating this well we had Santyl under the wound VAC however the surface looks so clean today I am doubtful that is going to be necessary She is also being treated with HBO. This is helped her dramatically. Apparently her  current certification will be up on 8/31. She is only had 19 dives and we will certainly need to recertify her up for this. Since I saw her last week I put in Sarah secure text message to Dr. Claudia Desanctis through epic to  let him know that we were going ahead with Sarah wound VAC. He did not have any objections to this. He is offering her more surgery however in the eventuality that this does not close 8/30; the patient missed 2 treatments of HBO last week because of nausea. I think she is now on Compazine which seems to have helped. Otherwise she is tolerating HBO well. We are collagen under wound VAC. Direct depth of wound measured at 1.2 cm which I think is probably unchanged however the undermining area is down to 0.5 which I think is an improvement 9/6; patient continues with chemotherapy although she says she is troll of her nausea. We are using collagen with Sarah wound VAC on the remaining wound on her chest. No major improvements in the undermining area from 12-5 currently measuring 0.5 cm. This is no better than last week 9/13; patient continuing with chemotherapy. She completes HBO today. We are using silver collagen under wound VAC. No major improvements however the undermining area seems somewhat better to me. 9/20; we are using silver collagen under the wound VAC and although about 60% of the wound looks healthy. At the base of the wound there is still Sarah very adherent slough. She has been approved for TheraSkin which I would like to use if we can get the wound bed in Sarah cleaner situation. 9/27; we are using Santyl under the wound VAC. Dimensions are slightly better including depth of 0.7 cm. Slight improvement in length and width the undermining area currently at 12-2 done in itself and improvement measures 0.6 which is not too much of improvement in terms of the probable depth Objective Constitutional Sitting or standing Blood Pressure is within target range for patient.. Pulse regular and within target range for patient.Marland Kitchen Respirations regular, non-labored and within target range.. Temperature is normal and within the target range for the patient.Marland Kitchen Appears in no distress. Vitals Time Taken: 7:50 AM, Height: 67  in, Weight: 257 lbs, BMI: 40.2, Temperature: 97.3 F, Pulse: 87 bpm, Respiratory Rate: 16 breaths/min, Blood Pressure: 114/80 mmHg. General Notes: Wound exam; left breast area. Surgical incision close to the anterior axillary line. Still some slough on the surface. The undermining area is not nearly as broad as it once was only at 12-2 o'clock. This undermining depth is 0.6 cm today which is roughly about the same. Length and width of come in Sarah bit. Still using an open curette to remove the fibrinous debris from the surface Integumentary (Hair, Skin) Wound #1 status is Open. Original cause of wound was Surgical Injury. The date acquired was: 01/16/2020. The wound has been in treatment 25 weeks. The wound is located on the Left Chest. The wound measures 2.7cm length x 2.8cm width x 0.7cm depth; 5.938cm^2 area and 4.156cm^3 volume. There is Fat Layer (Subcutaneous Tissue) exposed. There is no tunneling noted, however, there is undermining starting at 10:00 and ending at 12:00 with Sarah maximum distance of 0.6cm. There is Sarah medium amount of serosanguineous drainage noted. The wound margin is well defined and not attached to the wound base. There is large (67-100%) red granulation within the wound bed. There is Sarah small (1-33%) amount of necrotic tissue within the wound bed including Adherent Slough. 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 There was Sarah Excisional Skin/Subcutaneous Tissue Debridement with Sarah total area of 7.56 sq cm performed by [name]., MD. With the following instrument(s): Curette to remove Non-Viable  tissue/material. Material removed includes Subcutaneous Tissue and Slough and after achieving pain control using Other (Benzocaine). No specimens were taken. Sarah time out was conducted at 08:03, prior to the start of the procedure. Sarah Minimum amount of bleeding was controlled with Pressure. The procedure was tolerated well. Post Debridement Measurements: 2.7cm length x 2.8cm width x 0.7cm depth; 4.156cm^3 volume. Character of Wound/Ulcer Post Debridement is stable. Post procedure Diagnosis Wound #1: Same as Pre-Procedure Plan Follow-up Appointments: Return Appointment in 1 week. - Tuesday with Dr. Dellia Nims Nurse Visit: - Vac dressing change Friday Bathing/ Shower/ Hygiene: May shower with protection but do not get wound dressing(s) wet. Negative Presssure Wound Therapy: Wound Vac to wound continuously at 164m/hg pressure Black Foam - Apply Santyl to wound bed, under foam WOUND #1: - Chest Wound Laterality: Left Cleanser: Wound Cleanser 2 x Per Week/15 Days Discharge Instructions: Cleanse the wound with wound cleanser prior to applying Sarah clean dressing using gauze sponges, not tissue or cotton balls. Prim Dressing: Santyl Ointment 2 x Per Week/15 Days ary Discharge Instructions: Apply nickel thick amount to wound bed under vac foam #1 I think the patient's wound is generally better. Contracting length width and depth. The undermining area is no longer is brought as it once was but still with the same depth at the point 6 cm. The wound depth is 0.7 cm. Her to debridement as described to get to what looks like Sarah healthy granulated surface. Very gritty fibrinous material on the surface 2. In terms of the wound VAC I am continuing to use Santyl. I was really hoping to get the tunneling area to close completely before I stop the wound VAC. 3. She is already approved for TheraSkin I just do not think the surface is ready and as mentioned I was really hoping to get the tunneling area at the  close down Electronic Signature(s) Signed: 10/01/2020 4:21:38 PM By: RLinton HamMD Entered By: RLinton Hamon 10/01/2020 08:15:50 -------------------------------------------------------------------------------- SuperBill Details Patient Name: Date of Service: ARonnie DerbyIE D. 10/01/2020 Medical Record Number: [id number] Account Number: [id number] of Birth/Sex: Treating RN: [id number](50y.o. 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

## 2020-10-02 NOTE — Progress Notes (Signed)
ARLYNE, BRANDES ([id number]) Visit Report for 10/01/2020 Arrival Information Details Patient Name: Date of Service: [name] 10/01/2020 7:30 A M Medical Record Number: [id number] Patient Account Number: [id number] Date of Birth/Sex: Treating RN: 06/22/1970 (50 y.o. [name] Primary Care Kaleem Sartwell: [name] Other Clinician: Referring Shan Padgett: Treating Kyaire Gruenewald/Extender: [name] in Treatment: 25 Visit Information History Since Last Visit Added or deleted any medications: No Patient Arrived: Ambulatory Any new allergies or adverse reactions: No Arrival Time: 07:47 Had a fall or experienced change in No Transfer Assistance: None activities of daily living that may affect Patient Identification Verified: Yes risk of falls: Secondary Verification Process Completed: Yes Signs or symptoms of abuse/neglect since last visito No Patient Requires Transmission-Based Precautions: No Hospitalized since last visit: No Patient Has Alerts: No Implantable device outside of the clinic excluding No cellular tissue based products placed in the center since last visit: Has Dressing in Place as Prescribed: Yes Pain Present Now: No Electronic Signature(s) Signed: 10/01/2020 4:47:08 PM By: Lorrin Jackson Entered By: Lorrin Jackson on 10/01/2020 07:49:48 -------------------------------------------------------------------------------- Encounter Discharge Information Details Patient Name: Date of Service: [name] 10/01/2020 7:30 A M Medical Record Number: [id number] Patient Account Number: [id number] Date of Birth/Sex: Treating RN: 03-18-1970 (50 y.o. [name] Primary Care Aviel Davalos: [name] Other Clinician: Referring Lizzie An: Treating Jie Stickels/Extender: [name] in Treatment: 25 Encounter Discharge Information Items Post Procedure Vitals Discharge Condition:  Stable Temperature (F): 97.3 Ambulatory Status: Ambulatory Pulse (bpm): 87 Discharge Destination: Home Respiratory Rate (breaths/min): 16 Transportation: Private Auto Blood Pressure (mmHg): 114/80 Schedule Follow-up Appointment: Yes Clinical Summary of Care: Provided on 10/01/2020 Form Type Recipient Paper Patient Patient Electronic Signature(s) Signed: 10/01/2020 11:19:31 AM By: Lorrin Jackson Entered By: Lorrin Jackson on 10/01/2020 11:19:31 -------------------------------------------------------------------------------- Lower Extremity Assessment Details Patient Name: Date of Service: [name] 10/01/2020 7:30 A M Medical Record Number: [id number] Patient Account Number: [id number] Date of Birth/Sex: Treating RN: 01/11/1970 (50 y.o. [name] Primary Care Dohn Stclair: [name] Other Clinician: Referring Tajon Moring: Treating Race Latour/Extender: [name] in Treatment: 25 Electronic Signature(s) Signed: 10/01/2020 4:47:08 PM By: Lorrin Jackson Entered By: Lorrin Jackson on 10/01/2020 07:54:00 -------------------------------------------------------------------------------- Multi Wound Chart Details Patient Name: Date of Service: [name] 10/01/2020 7:30 A M Medical Record Number: [id number] Patient Account Number: [id number] Date of Birth/Sex: Treating RN: 1970/10/28 (50 y.o. [name], [name] Primary Care Venson Ferencz: [name] Other Clinician: Referring Edla Para: Treating Sriansh Farra/Extender: [name] in Treatment: 25 Vital Signs Height(in): 58 Pulse(bpm): 16 Weight(lbs): 2 Blood Pressure(mmHg): 114/80 Body Mass Index(BMI): 40 Temperature(F): 97.3 Respiratory Rate(breaths/min): 16 Photos: [N/A:N/A] Left Chest N/A N/A Wound Location: Surgical Injury N/A N/A Wounding Event: Open Surgical Wound N/A N/A Primary Etiology: Necrosis (Radiation) N/A N/A Secondary  Etiology: Hypertension, Osteoarthritis, ReceivedN/A N/A Comorbid History: Chemotherapy, Received Radiation 01/16/2020 N/A N/A Date Acquired: 25 N/A N/A Weeks of Treatment: Open N/A N/A Wound Status: 2.7x2.8x0.7 N/A N/A Measurements L x W x D (cm) 5.938 N/A N/A A (cm) : rea 4.156 N/A N/A Volume (cm) : 6.70% N/A N/A % Reduction in A rea: 40.60% N/A N/A % Reduction in Volume: 10 Starting Position 1 (o'clock): 12 Ending Position 1 (o'clock): 0.6 Maximum Distance 1 (cm): Yes N/A N/A Undermining: Full Thickness Without Exposed N/A N/A Classification: Support Structures Medium N/A N/A Exudate Amount: Serosanguineous N/A N/A  Exudate Type: red, brown N/A N/A Exudate Color: Well defined, not attached N/A N/A Wound Margin: Large (67-100%) N/A N/A Granulation Amount: Red N/A N/A Granulation Quality: Small (1-33%) N/A N/A Necrotic Amount: Fat Layer (Subcutaneous Tissue): Yes N/A N/A Exposed Structures: Fascia: No Tendon: No Muscle: No Joint: No Bone: No Small (1-33%) N/A N/A Epithelialization: Debridement - Excisional N/A N/A Debridement: Pre-procedure Verification/Time Out 08:03 N/A N/A Taken: Other N/A N/A Pain Control: Subcutaneous, Slough N/A N/A Tissue Debrided: Skin/Subcutaneous Tissue N/A N/A Level: 7.56 N/A N/A Debridement A (sq cm): rea Curette N/A N/A Instrument: Minimum N/A N/A Bleeding: Pressure N/A N/A Hemostasis A chieved: Procedure was tolerated well N/A N/A Debridement Treatment Response: 2.7x2.8x0.7 N/A N/A Post Debridement Measurements L x W x D (cm) 4.156 N/A N/A Post Debridement Volume: (cm) Debridement N/A N/A Procedures Performed: Treatment Notes Electronic Signature(s) Signed: 10/01/2020 4:21:38 PM By: [name] MD Signed: 10/02/2020 5:21:18 PM By: Rhae Hammock RN Entered By: [name] on 10/01/2020  08:10:55 -------------------------------------------------------------------------------- Multi-Disciplinary Care Plan Details Patient Name: Date of Service: [name] 10/01/2020 7:30 A M Medical Record Number: [id number] Patient Account Number: [id number] Date of Birth/Sex: Treating RN: 1970/09/12 (50 y.o. [name] Primary Care : [name] Other Clinician: Referring : Treating /Extender: [name] in Treatment: 25 Active Inactive Wound/Skin Impairment Nursing Diagnoses: Impaired tissue integrity Knowledge deficit related to smoking impact on wound healing Knowledge deficit related to ulceration/compromised skin integrity Goals: Patient will demonstrate a reduced rate of smoking or cessation of smoking Date Initiated: 04/09/2020 Date Inactivated: 06/18/2020 Target Resolution Date: 06/28/2020 Goal Status: Met Patient will have a decrease in wound volume by X% from date: (specify in notes) Date Initiated: 04/09/2020 Date Inactivated: 07/03/2020 Target Resolution Date: 07/05/2020 Goal Status: Met Patient/caregiver will verbalize understanding of skin care regimen Date Initiated: 04/09/2020 Target Resolution Date: 10/29/2020 Goal Status: Active Ulcer/skin breakdown will have a volume reduction of 30% by week 4 Date Initiated: 04/09/2020 Date Inactivated: 05/14/2020 Target Resolution Date: 05/11/2020 Unmet Reason: see wound Goal Status: Unmet meaurements. Interventions: Assess patient/caregiver ability to obtain necessary supplies Assess patient/caregiver ability to perform ulcer/skin care regimen upon admission and as needed Assess ulceration(s) every visit Provide education on smoking Notes: Electronic Signature(s) Signed: 10/01/2020 4:47:08 PM By: Lorrin Jackson Entered By: Lorrin Jackson on 10/01/2020 08:00:23 -------------------------------------------------------------------------------- Pain  Assessment Details Patient Name: Date of Service: [name] 10/01/2020 7:30 A M Medical Record Number: [id number] Patient Account Number: [id number] Date of Birth/Sex: Treating RN: 07/04/1970 (50 y.o. [name] Primary Care : [name] Other Clinician: Referring : Treating /Extender: [name] in Treatment: 25 Active Problems Location of Pain Severity and Description of Pain Patient Has Paino No Site Locations Pain Management and Medication Current Pain Management: Electronic Signature(s) Signed: 10/01/2020 4:47:08 PM By: Lorrin Jackson Entered By: Lorrin Jackson on 10/01/2020 07:53:53 -------------------------------------------------------------------------------- Patient/Caregiver Education Details Patient Name: Date of Service: [name] 9/27/2022andnbsp7:30 A M Medical Record Number: [id number] Patient Account Number: [id number] Date of Birth/Gender: Treating RN: 09-12-1970 (50 y.o. [name] Primary Care Physician: [name] Other Clinician: Referring Physician: Treating Physician/Extender: [name] in Treatment: 25 Education Assessment Education Provided To: Patient Education Topics Provided Wound/Skin Impairment: Methods: Explain/Verbal, Printed Responses: State content correctly [employer]) Signed: 10/01/2020 4:47:08 PM By: Lorrin Jackson Entered By: Lorrin Jackson on 10/01/2020 08:00:42 -------------------------------------------------------------------------------- Wound Assessment Details Patient Name: Date  of Service: [name], [name] 10/01/2020 7:30 A M Medical Record Number: [id number] Patient Account Number: [id number] Date of Birth/Sex: Treating RN: 1971-01-01 (50 y.o. [name] Primary Care : [name] Other Clinician: Referring : Treating /Extender:  [name] in Treatment: 25 Wound Status Wound Number: 1 Primary Open Surgical Wound Etiology: Wound Location: Left Chest Secondary Necrosis (Radiation) Wounding Event: Surgical Injury Etiology: Date Acquired: 01/16/2020 Wound Status: Open Weeks Of Treatment: 25 Comorbid Hypertension, Osteoarthritis, Received Chemotherapy, Clustered Wound: No History: Received Radiation Photos Wound Measurements Length: (cm) 2.7 Width: (cm) 2.8 Depth: (cm) 0.7 Area: (cm) 5.938 Volume: (cm) 4.156 % Reduction in Area: 6.7% % Reduction in Volume: 40.6% Epithelialization: Small (1-33%) Tunneling: No Undermining: Yes Starting Position (o'clock): 10 Ending Position (o'clock): 12 Maximum Distance: (cm) 0.6 Wound Description Classification: Full Thickness Without Exposed Support Structures Wound Margin: Well defined, not attached Exudate Amount: Medium Exudate Type: Serosanguineous Exudate Color: red, brown Foul Odor After Cleansing: No Slough/Fibrino Yes Wound Bed Granulation Amount: Large (67-100%) Exposed Structure Granulation Quality: Red Fascia Exposed: No Necrotic Amount: Small (1-33%) Fat Layer (Subcutaneous Tissue) Exposed: Yes Necrotic Quality: Adherent Slough Tendon Exposed: No Muscle Exposed: No Joint Exposed: No Bone Exposed: No Treatment Notes Wound #1 (Chest) Wound Laterality: Left Cleanser Wound Cleanser Discharge Instruction: Cleanse the wound with wound cleanser prior to applying a clean dressing using gauze sponges, not tissue or cotton balls. Peri-Wound Care Topical Primary Dressing Santyl Ointment Discharge Instruction: Apply nickel thick amount to wound bed under vac foam Secondary Dressing Secured With Compression Wrap Compression Stockings Add-Ons Electronic Signature(s) Signed: 10/01/2020 4:47:08 PM By: Lorrin Jackson Entered By: Lorrin Jackson on 10/01/2020  08:06:06 -------------------------------------------------------------------------------- [place] Details Patient Name: Date of Service: [name] 10/01/2020 7:30 A M Medical Record Number: [id number] Patient Account Number: [id number] Date of Birth/Sex: Treating RN: [birth year] (50 y.o. [name] Primary Care : [name] Other Clinician: Referring : Treating /Extender: [name] in Treatment: 25 Vital Signs Time Taken: 07:50 Temperature (F): 97.3 Height (in): 67 Pulse (bpm): 87 Weight (lbs): 257 Respiratory Rate (breaths/min): 16 Body Mass Index (BMI): 40.2 Blood Pressure (mmHg): 114/80 Reference Range: 80 - 120 mg / dl Electronic Signature(s) Signed: 10/01/2020 4:47:08 PM By: Lorrin Jackson Entered By: Lorrin Jackson on 10/01/2020 07:52:46

## 2020-10-03 ENCOUNTER — Other Ambulatory Visit (HOSPITAL_COMMUNITY): Payer: Self-pay

## 2020-10-03 NOTE — Progress Notes (Signed)
ZINEB, GLADE ([id number]) Visit Report for 07/29/2020 SuperBill Details Patient Name: Date of Service: [name] 07/29/2020 Medical Record Number: [id number] Patient Account Number: [id number] Date of Birth/Sex: Treating RN: 01-19-1970 (50 y.o. 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

## 2020-10-04 ENCOUNTER — Encounter (HOSPITAL_BASED_OUTPATIENT_CLINIC_OR_DEPARTMENT_OTHER): Payer: Medicaid Other | Admitting: Internal Medicine

## 2020-10-04 ENCOUNTER — Other Ambulatory Visit: Payer: Self-pay

## 2020-10-04 DIAGNOSIS — T8131XD Disruption of external operation (surgical) wound, not elsewhere classified, subsequent encounter: Secondary | ICD-10-CM | POA: Diagnosis not present

## 2020-10-04 NOTE — Progress Notes (Signed)
[name], [name] ([id number]) Visit Report for 10/04/2020 Arrival Information Details Patient Name: Date of Service: [name] [name] 10/04/2020 1:00 PM Medical Record Number: [id number] Patient Account Number: [id number] Date of Birth/Sex: Treating RN: 01-23-1970 (50 y.o. [name] Primary Care Tarris Delbene: [name] Other Clinician: Referring Armida Vickroy: Treating Melodye Swor/Extender: [name] in Treatment: 25 Visit Information History Since Last Visit Added or deleted any medications: No Patient Arrived: Ambulatory Any new allergies or adverse reactions: No Arrival Time: 13:19 Had Sarah fall or experienced change in No Accompanied By: self activities of daily living that may affect Transfer Assistance: None risk of falls: Patient Identification Verified: Yes Signs or symptoms of abuse/neglect since last visito No Secondary Verification Process Completed: Yes Hospitalized since last visit: No Patient Requires Transmission-Based Precautions: No Implantable device outside of the clinic excluding No Patient Has Alerts: No cellular tissue based products placed in the center since last visit: Has Dressing in Place as Prescribed: No Pain Present Now: Yes Notes pt removed dressing to shower prior to appointment Electronic Signature(s) Signed: 10/04/2020 4:13:46 PM By: Baruch Gouty RN, BSN Entered By: Baruch Gouty on 10/04/2020 13:20:34 -------------------------------------------------------------------------------- Encounter Discharge Information Details Patient Name: Date of Service: [name], [name] 10/04/2020 1:00 PM Medical Record Number: [id number] Patient Account Number: [id number] Date of Birth/Sex: Treating RN: June 09, 1970 (50 y.o. [name] Primary Care Merinda Victorino: [name] Other Clinician: Referring Cristel Rail: Treating Anique Beckley/Extender: [name] in Treatment: 25 Encounter  Discharge Information Items Discharge Condition: Stable Ambulatory Status: Ambulatory Discharge Destination: Home Transportation: Private Auto Accompanied By: self Schedule Follow-up Appointment: Yes Clinical Summary of Care: Patient Declined Electronic Signature(s) Signed: 10/04/2020 4:13:46 PM By: Baruch Gouty RN, BSN Entered By: Baruch Gouty on 10/04/2020 13:37:40 -------------------------------------------------------------------------------- Negative Pressure Wound Therapy Maintenance (NPWT) Details Patient Name: Date of Service: [name], [name] 10/04/2020 1:00 PM Medical Record Number: [id number] Patient Account Number: [id number] Date of Birth/Sex: Treating RN: November 03, 1970 (50 y.o. [name] Primary Care Christe Tellez: [name] Other Clinician: Referring Persephone Schriever: Treating Maquita Sandoval/Extender: [name] in Treatment: 25 NPWT Maintenance Performed for: Wound #1 Left Chest Performed By: Baruch Gouty, RN Type: VAC System Coverage Size (sq cm): 7.56 Pressure Type: Constant Pressure Setting: 125 mmHG Drain Type: None Primary Contact: Other : santyl Sponge/Dressing Type: Foam, Black Date Initiated: 08/21/2020 Dressing Removed: No Quantity of Sponges/Gauze Removed: pt removed at home Canister Changed: No Dressing Reapplied: No Quantity of Sponges/Gauze Inserted: 2 Respones T Treatment: o good Days On NPWT : 45 Electronic Signature(s) Signed: 10/04/2020 4:13:46 PM By: Baruch Gouty RN, BSN Entered By: Baruch Gouty on 10/04/2020 13:36:47 -------------------------------------------------------------------------------- Pain Assessment Details Patient Name: Date of Service: [name], [name] 10/04/2020 1:00 PM Medical Record Number: [id number] Patient Account Number: [id number] Date of Birth/Sex: Treating RN: 04/27/1970 (50 y.o. [name] Primary Care Kaidan Harpster: [name] Other  Clinician: Referring Latrisha Coiro: Treating Shree Espey/Extender: [name] in Treatment: 25 Active Problems Location of Pain Severity and Description of Pain Patient Has Paino Yes Site Locations Pain Location: Pain in Ulcers With Dressing Change: Yes Duration of the Pain. Constant / Intermittento Intermittent Rate the pain. Current Pain Level: 3 Character of Pain Describe the Pain: Aching Pain Management and Medication Current Pain Management: Medication: Yes Is the Current Pain Management Adequate: Adequate How does your wound impact your activities of daily livingo Sleep: No Bathing:  No Appetite: No Relationship With Others: No Bladder Continence: No Emotions: No Bowel Continence: No Work: No Toileting: No Drive: No Dressing: No Hobbies: No Electronic Signature(s) Signed: 10/04/2020 4:13:46 PM By: Baruch Gouty RN, BSN Entered By: Baruch Gouty on 10/04/2020 13:21:16 -------------------------------------------------------------------------------- Patient/Caregiver Education Details Patient Name: Date of Service: [name] 9/30/2022andnbsp1:00 PM Medical Record Number: [id number] Patient Account Number: [id number] Date of Birth/Gender: Treating RN: 1970/05/07 (50 y.o. [name] Primary Care Physician: [name] Other Clinician: Referring Physician: Treating Physician/Extender: [name] in Treatment: 25 Education Assessment Education Provided To: Patient Education Topics Provided Wound/Skin Impairment: Methods: Explain/Verbal Responses: Reinforcements needed, State content correctly [employer]) Signed: 10/04/2020 4:13:46 PM By: Baruch Gouty RN, BSN Entered By: Baruch Gouty on 10/04/2020 13:37:27 -------------------------------------------------------------------------------- Wound Assessment Details Patient Name: Date of Service: [name], LO V IE  D. 10/04/2020 1:00 PM Medical Record Number: [id number] Patient Account Number: [id number] Date of Birth/Sex: Treating RN: October 23, 1970 (50 y.o. [name] Primary Care Berthold Glace: [name] Other Clinician: Referring Johnell Landowski: Treating Mattilyn Crites/Extender: [name] in Treatment: 25 Wound Status Wound Number: 1 Primary Open Surgical Wound Etiology: Wound Location: Left Chest Secondary Necrosis (Radiation) Wounding Event: Surgical Injury Etiology: Date Acquired: 01/16/2020 Wound Status: Open Weeks Of Treatment: 25 Comorbid Hypertension, Osteoarthritis, Received Chemotherapy, Clustered Wound: No History: Received Radiation Wound Measurements Length: (cm) 2.7 Width: (cm) 2.8 Depth: (cm) 0.7 Area: (cm) 5.938 Volume: (cm) 4.156 % Reduction in Area: 6.7% % Reduction in Volume: 40.6% Epithelialization: Small (1-33%) Tunneling: No Undermining: No Wound Description Classification: Full Thickness Without Exposed Support Structures Wound Margin: Well defined, not attached Exudate Amount: Medium Exudate Type: Serosanguineous Exudate Color: red, brown Foul Odor After Cleansing: No Slough/Fibrino Yes Wound Bed Granulation Amount: Large (67-100%) Exposed Structure Granulation Quality: Red, Pink Fascia Exposed: No Necrotic Amount: Small (1-33%) Fat Layer (Subcutaneous Tissue) Exposed: Yes Necrotic Quality: Adherent Slough Tendon Exposed: No Muscle Exposed: No Joint Exposed: No Bone Exposed: No Treatment Notes Wound #1 (Chest) Wound Laterality: Left Cleanser Wound Cleanser Discharge Instruction: Cleanse the wound with wound cleanser prior to applying Sarah clean dressing using gauze sponges, not tissue or cotton balls. Peri-Wound Care Topical Primary Dressing Santyl Ointment Discharge Instruction: Apply nickel thick amount to wound bed under vac foam Secondary Dressing Secured With Compression Wrap Compression  Stockings Add-Ons Notes VAC Electronic Signature(s) Signed: 10/04/2020 4:13:46 PM By: Baruch Gouty RN, BSN Entered By: Baruch Gouty on 10/04/2020 13:35:01 -------------------------------------------------------------------------------- [place] Details Patient Name: Date of Service: [name], [name] 10/04/2020 1:00 PM Medical Record Number: [id number] Patient Account Number: [id number] Date of Birth/Sex: Treating RN: 14-Nov-1970 (50 y.o. [name], [name] Primary Care Melyssa Signor: [name] Other Clinician: Referring Stedman [place]: Treating Prather Failla/Extender: [name] in Treatment: 25 Vital Signs Time Taken: 13:21 Temperature (F): 98.1 Height (in): 67 Pulse (bpm): 89 Source: Stated Respiratory Rate (breaths/min): 18 Weight (lbs): 257 Blood Pressure (mmHg): 102/74 Source: Stated Reference Range: 80 - 120 mg / dl Body Mass Index (BMI): 40.2 Electronic Signature(s) Signed: 10/04/2020 4:13:46 PM By: Baruch Gouty RN, BSN Entered By: Baruch Gouty on 10/04/2020 13:21:52

## 2020-10-04 NOTE — Progress Notes (Signed)
FLOETTA, BRICKEY ([id number]) Visit Report for 10/04/2020 SuperBill Details Patient Name: Date of Service: [name] 10/04/2020 Medical Record Number: [id number] Patient Account Number: [id number] Date of Birth/Sex: Treating RN: 1970-10-04 (50 y.o. 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

## 2020-10-08 ENCOUNTER — Other Ambulatory Visit: Payer: Self-pay

## 2020-10-08 ENCOUNTER — Other Ambulatory Visit (HOSPITAL_COMMUNITY): Payer: Self-pay

## 2020-10-08 ENCOUNTER — Encounter (HOSPITAL_BASED_OUTPATIENT_CLINIC_OR_DEPARTMENT_OTHER): Payer: Medicaid Other | Attending: Internal Medicine | Admitting: Internal Medicine

## 2020-10-08 DIAGNOSIS — Z923 Personal history of irradiation: Secondary | ICD-10-CM | POA: Diagnosis not present

## 2020-10-08 DIAGNOSIS — Z9221 Personal history of antineoplastic chemotherapy: Secondary | ICD-10-CM | POA: Diagnosis not present

## 2020-10-08 DIAGNOSIS — T8131XA Disruption of external operation (surgical) wound, not elsewhere classified, initial encounter: Secondary | ICD-10-CM | POA: Diagnosis not present

## 2020-10-08 DIAGNOSIS — D0512 Intraductal carcinoma in situ of left breast: Secondary | ICD-10-CM | POA: Insufficient documentation

## 2020-10-08 DIAGNOSIS — Z9013 Acquired absence of bilateral breasts and nipples: Secondary | ICD-10-CM | POA: Diagnosis not present

## 2020-10-08 DIAGNOSIS — Z171 Estrogen receptor negative status [ER-]: Secondary | ICD-10-CM | POA: Insufficient documentation

## 2020-10-09 NOTE — Progress Notes (Signed)
[name], [name] ([id number]) Visit Report for 10/08/2020 HPI Details Patient Name: Date of Service: [name]. 10/08/2020 12:30 PM Medical Record Number: [id number] Patient Account Number: [id number] Date of Birth/Sex: Treating RN: 06-02-1970 (50 y.o. [name], [name] Primary Care Provider: [name] Other Clinician: Referring Provider: Treating Provider/Extender: [name] in Treatment: 26 History of Present Illness HPI Description: After canADMISSION 04/09/2020 This is a 50 year old woman who is history of breast cancer in the left breast goes back to 2018. He was found to have a mass in her left breast. She underwent a left back lumpectomy and then subsequent chemo that she did not tolerate very well. She had seed implants placed but then underwent external beam a radiation in the spring 2019. 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 Her adjunctive radiation was from 03/11/2017 through 04/21/2017. Unfortunately she had a left breast biopsy on 11/24/2019 and it was again positive for an invasive ductal carcinoma grade 3 he is to resume at receptor 80% positive. HER-2 negative progesterone negative. She opted for bilateral mastectomies which were done on 01/09/2020 on the right with no evidence of malignancy on the left stage IIb invasive ductal carcinoma. She again received chemotherapy but she did not tolerate it because of wound dehiscence and low platelet counts according to the patient. Unfortunately her mastectomy wound dehisced on the left breast. She was taken back to the OR by [name] of general surgery on 33/24/22 and she underwent a debridement and subsequent attempt at closure. She had been previously treated with Augmentin for suspected underlying infection. Once again the wound has dehisced in  the mid aspect. It is roughly 1 cm deep there is tunnels both medially and laterally she. She still has sutures in place. Still describes constant pain drainage. She also has a drain still in place that drains about 10 cc/day. Currently she is changing ABD pad several times a day 4/12; patient went back to see [name] who remove most of the stitches and debrided her wound. She has been using silver alginate culture I did last time which was a PCR culture showed Peptostreptococcus, Enterococcus faecalis and Staphylococcus epidermidis which is of questionable significance. She has a larger wound now that the sutures have been removed. There are undermining areas superior medial superior lateral and a deep tunneling area in the inferior part of the wound. There is no obvious purulence. I started her on Augmentin on 04/12/2020 and I will likely extend this for another week AMAZINGLY; she is already been approved for HBO and will start today. [name] Echocardiogram which I reviewed from 2019 showed a normal ejection fraction.. I went over side effects of HBO with the patient and answered any of her questions. Her husband was present 5/3; its been a while since have seen this wound. She went to see [name] about 2-1/2 weeks ago according to the patient he can remove some of the surrounding debris. She will see him again later this week or early next week. She has been using silver alginate. A lot of this is problematic since she has Medicaid which traditionally does not cover wound care supplies although it traditionally does not cover HBO either Her wound actually looks quite a bit better than the last time I saw this better looking surface healthy looking  tissue. She has tunnels at 810 2 in 6 the deepest of these is at 2 at 2.2 cm there is no exposed bone. 5/10; the patient was coming in for nurse visit today but requested that I see the wound because of surface  discoloration. She states the Iodoflex makes the surface of this turned "white". She is not really complaining of undue tenderness. She is changing this every second day. The goal of this had been to get the surface of the wound to a vibrant red color and then potentially look at changing to a silver collagen-based dressing, Puraply, and then perhaps an alternative skin substituteo Derma vest. The patient tells me she has an appoint with her oncologist [name] on the 19th. She apparently think she is going to get another PET scan. Based on this I would like his comment about whether there is possibility of any malignancy in the wound itself or just underneath it. Up to now we have not really thought that was true 5/17; nice improvement in the chest wound. Less depth. We have been using Iodoflex to clean off the wound surface this seems to have done nicely. Patient went to see [name] 2 days ago who has referred her to see Dr. Claudia Desanctis. I certainly agree with the thought the surface of the wound looks a lot better now and I think consideration of plastic surgery is certainly something the patient should discuss. I have talked to the patient about this today and I certainly support a conversation with Dr. Claudia Desanctis. I also wondered about is there any residual cancer in this. [name] apparently did not seem to think so per the patient although I do not see that in the text conversations we have had. I also wonder about a skin substitute if her version of Medicare [[employer]] would cover this. [name] is apparently considering starting her on [place]. Patient is already very scared about the side effects but she will discuss this with [name] on 5/19 5/31; patient's wound about the same in terms of size she still has the tunneling area to the left. Surface of the wound looks a lot better there has been some filling in the granulation I have been included on serial text between Dr. Callie Fielding surgery] Dr. Ruffin Frederick oncology] and Dr. Para Skeans surgery]. I think there is not a lot of thought that the patient has recurrence of her cancer her MRI was indeterminant but [name] did not feel that there was any reason to think she has a major recurrence and he was not in favor of doing a dedicated breast MRI. Dr. Claudia Desanctis wants to go ahead with a flap closure I did it. I have not reviewed his note but per the patient this is a latissimus dorsi myocutaneous flap or I think a skin flap from her abdomen. I have talked to the patient I think it is time to attempt this. If I can continue hyperbarics surrounding the flap then we will attempt to do this 6/14; patient using Prisma as the primary dressing here. She has completed hyperbarics and did very well. Her wound is responded nicely. The depth of this and the condition of the granulation tissue looks a lot better. She has undermining areas at 2 and 10:00 both of these seem to be better as well. She is scheduled for flap closure on 7/11 by Dr. Claudia Desanctis 6/29; the patient continues to use Prisma wet-to-dry. Her graft is scheduled for 7/11 by  Dr. Claudia Desanctis. We are looking at getting her additional hyperbaric treatment which I think might help with the likelihood of graft survival in this difficult irradiated area 7/21; patient returns to clinic to reinitiate HBO after her surgical procedure by Dr. Claudia Desanctis. On 07/15/2020 she had debridement of left chest wound and a rotational flap closure. She also had excision of right excess axillary tissue. Pathology did not show any malignancy and follow-up Dr. Claudia Desanctis saw the patient yesterday. He noted a 2 cm area of wound dehiscence. Recommended she return for hyperbaric oxygen and she returns today for her first treatment. Follow-up with him in 2 weeks. We were not actually going to see the patient today her appointment to be evaluated was Monday however she was concerned about a "fishy odor". She is  also noted some drainage. Not specifically dressing in the wound area but covering with Adaptic 7/25; I am seeing this wound and follow-up. So far her culture is polymicrobial not growing any specific anything specific nevertheless with the Augmentin and the silver alginate her odor is a lot better. Not as much periwound tenderness. Dimensions measuring 2.7 x 3.3 x 1.6. She has undermining from 10-1 o'clock of about 1 and half centimeters. Necrotic fat on the surface of this... 08/13/2020 upon evaluation today patient appears to be doing decently well in regard to her wound all things considered. Obviously this is much more open than what was hoped for based on the fact that she had a flap performed which was supposed to completely close this wound. Nonetheless currently she tells me that she is not really having any significant pain fortunately the drainage is improved compared to last time there is still some evidence of odor as far as the drainage is concerned but is definitely not as purulent as what was previously noted. With that being said I do believe that there are quite a few sutures which are quite embedded. Nonetheless I believe this needs to be removed. In fact if they are not I am afraid this is again actually grow into the skin and that is can to be even bigger issue. Some of them even so at this point appeared that there could be difficult to even get out. 8/17; this is the first time I am seeing this in about 3 weeks. The patient had stop by my office yesterday to discuss after completing hyperbaric therapy. Based on what she said I was expecting the worst today however apparently with dressing changes and perhaps debridement last week the surface of the wound actually looks better than what I was expecting. There is considerable depth although the last time I saw this the base of the wound was necrotic there is still some necrotic tissue on the surface of the wound bed however in  general the rest the tissue looks healthy even with some bleeding. She has been using cleaning the wound with Dakin's and then applying Prisma and then backing Dakin's wet-to-dry. This seems to work to clean the wound surface up. They also ordered a wound VAC and I discussed this with her in depth today She is not systemically unwell. She is not running a fever there is no odor. She talks about pain over the lower part of the flap to touch. This does not appear to be related to movement of her shoulder. 8/23; the patient has been using the wound VAC for 1 week. Already a remarkable improvement and she appears to be tolerating this well we had Santyl under  the wound VAC however the surface looks so clean today I am doubtful that is going to be necessary She is also being treated with HBO. This is helped her dramatically. Apparently her current certification will be up on 8/31. She is only had 19 dives and we will certainly need to recertify her up for this. Since I saw her last week I put in a secure text message to Dr. Claudia Desanctis through epic to let him know that we were going ahead with a wound VAC. He did not have any objections to this. He is offering her more surgery however in the eventuality that this does not close 8/30; the patient missed 2 treatments of HBO last week because of nausea. I think she is now on Compazine which seems to have helped. Otherwise she is tolerating HBO well. We are collagen under wound VAC. Direct depth of wound measured at 1.2 cm which I think is probably unchanged however the undermining area is down to 0.5 which I think is an improvement 9/6; patient continues with chemotherapy although she says she is troll of her nausea. We are using collagen with a wound VAC on the remaining wound on her chest. No major improvements in the undermining area from 12-5 currently measuring 0.5 cm. This is no better than last week 9/13; patient continuing with chemotherapy. She completes  HBO today. We are using silver collagen under wound VAC. No major improvements however the undermining area seems somewhat better to me. 9/20; we are using silver collagen under the wound VAC and although about 60% of the wound looks healthy. At the base of the wound there is still a very adherent slough. She has been approved for TheraSkin which I would like to use if we can get the wound bed in a cleaner situation. 9/27; we are using Santyl under the wound VAC. Dimensions are slightly better including depth of 0.7 cm. Slight improvement in length and width the undermining area currently at 12-2 done in itself and improvement measures 0.6 which is not too much of improvement in terms of the probable depth 10/4; using Santyl under the wound VAC. Comes in today with a fairly dramatic improvement in surface area and marked improvement in the undermining area as well as condition of the wound bed. This is gratifying to see in view of this were going to continue the Santyl under the [facility] for another week. Electronic Signature(s) Signed: 10/09/2020 10:14:24 AM By: [name] MD Entered By: [name] on 10/08/2020 13:21:31 -------------------------------------------------------------------------------- Physical Exam Details Patient Name: Date of Service: [name], [name]. 10/08/2020 12:30 PM Medical Record Number: [id number] Patient Account Number: [id number] Date of Birth/Sex: Treating RN: 03-May-1970 (50 y.o. [name], [name] Primary Care Provider: [name] Other Clinician: Referring Provider: Treating Provider/Extender: [name] in Treatment: 26 Constitutional Sitting or standing Blood Pressure is within target range for patient.. Pulse regular and within target range for patient.Marland Kitchen Respirations regular, non-labored and within target range.. Temperature is normal and within the target range for the patient.Marland Kitchen Appears in no  distress. Notes Wound exam; left breast area. Surgical wound complicated by prior radiation. Much improved wound surface undermining is down to a few millimeters. Surface area also much better Electronic Signature(s) Signed: 10/09/2020 10:14:24 AM By: [name] MD Entered By: [name] on 10/08/2020 13:22:18 -------------------------------------------------------------------------------- Physician Orders Details Patient Name: Date of Service: [name], [name]. 10/08/2020 12:30 PM Medical Record Number:  [id number] Patient Account Number: [id number] Date of Birth/Sex: Treating RN: [birth year] (50 y.o. 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 - Tuesday with Dr. Dellia Nims Return A Nurse Visit: - Vac dressing change Friday 10/11/20 Bathing/ Shower/ Hygiene May shower with protection but do not get wound dressing(s) wet. Negative Presssure Wound Therapy Wound Vac to wound continuously at 178m/hg pressure Black Foam - Apply Santyl to wound bed, under foam Wound Treatment Wound #1 - Chest Wound Laterality: Left Cleanser: Wound Cleanser 2 x Per Week/15 Days Discharge Instructions: Cleanse the wound with wound cleanser prior to applying a clean dressing using gauze sponges, not tissue or cotton balls. Prim Dressing: Santyl Ointment 2 x Per Week/15 Days ary Discharge Instructions: Apply nickel thick amount to wound bed  under vac foam Electronic Signature(s) Signed: 10/08/2020 5:11:46 PM By: BLorrin JacksonSigned: 10/09/2020 10:14:24 AM By: RLinton HamMD Entered By: BLorrin Jacksonon 10/08/2020 13:23:08 -------------------------------------------------------------------------------- Problem List Details Patient Name: Date of Service: ARoyetta CrochetV [name]. 10/08/2020 12:30 PM Medical Record Number: [id number] Account Number: [id number] of Birth/Sex: Treating RN: 609-17-1972(50y.o. 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 10/08/2020 12:30 PM Medical Record Number: [id number] Account Number: [id number] of Birth/Sex: Treating RN: 605/15/1972(50y.o. FTonita Khan [name] Primary Care Provider: JKarle PlumberOther Clinician: Referring Provider: Treating Provider/Extender: RNyra Marketin Treatment: 26 Subjective History of Present Illness (HPI) After canADMISSION 04/09/2020 This is a 50year old woman who is history of breast cancer in the left  breast goes back to 2018. He was found to have a mass in her left breast. She underwent a left back lumpectomy and then subsequent chemo that she did not tolerate very well. She had seed implants placed but then underwent external beam a radiation in the spring 2019. 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 Her adjunctive radiation was from 03/11/2017 through 04/21/2017. Unfortunately she had a left breast biopsy on 11/24/2019 and it was again positive for an invasive ductal carcinoma grade 3 he is to resume at receptor 80% positive. HER-2 negative progesterone negative. She opted for bilateral mastectomies which were done on 01/09/2020 on the right with no evidence of malignancy on the left stage IIb invasive ductal carcinoma. She again  received chemotherapy but she did not tolerate it because of wound dehiscence and low platelet counts according to the patient. Unfortunately her mastectomy wound dehisced on the left breast. She was taken back to the OR by [name] of general surgery on 33/24/22 and she underwent a debridement and subsequent attempt at closure. She had been previously treated with Augmentin for suspected underlying infection. Once again the wound has dehisced in the mid aspect. It is roughly 1 cm deep there is tunnels both medially and laterally she. She still has sutures in place. Still describes constant pain drainage. She also has a drain still in place that drains about 10 cc/day. Currently she is changing ABD pad several times a day 4/12; patient went back to see [name] who remove most of the stitches and debrided her wound. She has been using silver alginate culture I did last time which was a PCR culture showed Peptostreptococcus, Enterococcus faecalis and Staphylococcus epidermidis which is of questionable significance. She has a  larger wound now that the sutures have been removed. There are undermining areas superior medial superior lateral and a deep tunneling area in the inferior part of the wound. There is no obvious purulence. I started her on Augmentin on 04/12/2020 and I will likely extend this for another week AMAZINGLY; she is already been approved for HBO and will start today. [name] Echocardiogram which I reviewed from 2019 showed a normal ejection fraction.. I went over side effects of HBO with the patient and answered any of her questions. Her husband was present 5/3; its been a while since have seen this wound. She went to see [name] about 2-1/2 weeks ago according to the patient he can remove some of the surrounding debris. She will see him again later this week or early next week. She has been using silver alginate. A lot of this is problematic since she has Medicaid which traditionally does not cover wound care supplies although it traditionally does not cover HBO either Her wound actually looks quite a bit better than the last time I saw this better looking surface healthy looking tissue. She has tunnels at 810 2 in 6 the deepest of these is at 2 at 2.2 cm there is no exposed bone. 5/10; the patient was coming in for nurse visit today but requested that I see the wound because of surface discoloration. She states the Iodoflex makes the surface of this turned "white". She is not really complaining of undue tenderness. She is changing this every second day. The goal of this had been to get the surface of the wound to a vibrant red color and then potentially look at changing to a silver collagen-based dressing, Puraply, and then perhaps an alternative skin substituteo Derma vest. The patient tells me she has an appoint with her oncologist [name] on the 19th. She apparently think she is going to get another PET scan. Based on this I would like his comment about whether there is  possibility of any malignancy in the wound itself or just underneath it. Up to now we have not really thought that was true 5/17; nice improvement in the chest wound. Less depth. We have been using Iodoflex to clean off the wound surface this seems to have done nicely. Patient went to see [name] 2 days ago who has referred her to see Dr. Claudia Desanctis. I certainly agree with the thought the surface of the wound looks a lot better now and  I think consideration of plastic surgery is certainly something the patient should discuss. I have talked to the patient about this today and I certainly support a conversation with Dr. Claudia Desanctis. I also wondered about is there any residual cancer in this. [name] apparently did not seem to think so per the patient although I do not see that in the text conversations we have had. I also wonder about a skin substitute if her version of Medicare [[employer]] would cover this. [name] is apparently considering starting her on [place]. Patient is already very scared about the side effects but she will discuss this with [name] on 5/19 5/31; patient's wound about the same in terms of size she still has the tunneling area to the left. Surface of the wound looks a lot better there has been some filling in the granulation I have been included on serial text between Dr. Callie Fielding surgery] Dr. Ruffin Frederick oncology] and Dr. Para Skeans surgery]. I think there is not a lot of thought that the patient has recurrence of her cancer her MRI was indeterminant but [name] did not feel that there was any reason to think she has a major recurrence and he was not in favor of doing a dedicated breast MRI. Dr. Claudia Desanctis wants to go ahead with a flap closure I did it. I have not reviewed his note but per the patient this is a latissimus dorsi myocutaneous flap or I think a skin flap from her abdomen. I have talked to the patient I think it is time to attempt this. If I  can continue hyperbarics surrounding the flap then we will attempt to do this 6/14; patient using Prisma as the primary dressing here. She has completed hyperbarics and did very well. Her wound is responded nicely. The depth of this and the condition of the granulation tissue looks a lot better. She has undermining areas at 2 and 10:00 both of these seem to be better as well. She is scheduled for flap closure on 7/11 by Dr. Claudia Desanctis 6/29; the patient continues to use Prisma wet-to-dry. Her graft is scheduled for 7/11 by Dr. Claudia Desanctis. We are looking at getting her additional hyperbaric treatment which I think might help with the likelihood of graft survival in this difficult irradiated area 7/21; patient returns to clinic to reinitiate HBO after her surgical procedure by Dr. Claudia Desanctis. On 07/15/2020 she had debridement of left chest wound and a rotational flap closure. She also had excision of right excess axillary tissue. Pathology did not show any malignancy and follow-up Dr. Claudia Desanctis saw the patient yesterday. He noted a 2 cm area of wound dehiscence. Recommended she return for hyperbaric oxygen and she returns today for her first treatment. Follow-up with him in 2 weeks. We were not actually going to see the patient today her appointment to be evaluated was Monday however she was concerned about a "fishy odor". She is also noted some drainage. Not specifically dressing in the wound area but covering with Adaptic 7/25; I am seeing this wound and follow-up. So far her culture is polymicrobial not growing any specific anything specific nevertheless with the Augmentin and the silver alginate her odor is a lot better. Not as much periwound tenderness. Dimensions measuring 2.7 x 3.3 x 1.6. She has undermining from 10-1 o'clock of about 1 and half centimeters. Necrotic fat on the surface of this... 08/13/2020 upon evaluation today patient appears to be doing decently well in regard to her wound all things  considered.  Obviously this is much more open than what was hoped for based on the fact that she had a flap performed which was supposed to completely close this wound. Nonetheless currently she tells me that she is not really having any significant pain fortunately the drainage is improved compared to last time there is still some evidence of odor as far as the drainage is concerned but is definitely not as purulent as what was previously noted. With that being said I do believe that there are quite a few sutures which are quite embedded. Nonetheless I believe this needs to be removed. In fact if they are not I am afraid this is again actually grow into the skin and that is can to be even bigger issue. Some of them even so at this point appeared that there could be difficult to even get out. 8/17; this is the first time I am seeing this in about 3 weeks. The patient had stop by my office yesterday to discuss after completing hyperbaric therapy. Based on what she said I was expecting the worst today however apparently with dressing changes and perhaps debridement last week the surface of the wound actually looks better than what I was expecting. There is considerable depth although the last time I saw this the base of the wound was necrotic there is still some necrotic tissue on the surface of the wound bed however in general the rest the tissue looks healthy even with some bleeding. She has been using cleaning the wound with Dakin's and then applying Prisma and then backing Dakin's wet-to-dry. This seems to work to clean the wound surface up. They also ordered a wound VAC and I discussed this with her in depth today She is not systemically unwell. She is not running a fever there is no odor. She talks about pain over the lower part of the flap to touch. This does not appear to be related to movement of her shoulder. 8/23; the patient has been using the wound VAC for 1 week. Already a remarkable improvement and she  appears to be tolerating this well we had Santyl under the wound VAC however the surface looks so clean today I am doubtful that is going to be necessary She is also being treated with HBO. This is helped her dramatically. Apparently her current certification will be up on 8/31. She is only had 19 dives and we will certainly need to recertify her up for this. Since I saw her last week I put in a secure text message to Dr. Claudia Desanctis through epic to let him know that we were going ahead with a wound VAC. He did not have any objections to this. He is offering her more surgery however in the eventuality that this does not close 8/30; the patient missed 2 treatments of HBO last week because of nausea. I think she is now on Compazine which seems to have helped. Otherwise she is tolerating HBO well. We are collagen under wound VAC. Direct depth of wound measured at 1.2 cm which I think is probably unchanged however the undermining area is down to 0.5 which I think is an improvement 9/6; patient continues with chemotherapy although she says she is troll of her nausea. We are using collagen with a wound VAC on the remaining wound on her chest. No major improvements in the undermining area from 12-5 currently measuring 0.5 cm. This is no better than last week 9/13; patient continuing with chemotherapy. She completes HBO  today. We are using silver collagen under wound VAC. No major improvements however the undermining area seems somewhat better to me. 9/20; we are using silver collagen under the wound VAC and although about 60% of the wound looks healthy. At the base of the wound there is still a very adherent slough. She has been approved for TheraSkin which I would like to use if we can get the wound bed in a cleaner situation. 9/27; we are using Santyl under the wound VAC. Dimensions are slightly better including depth of 0.7 cm. Slight improvement in length and width the undermining area currently at 12-2 done  in itself and improvement measures 0.6 which is not too much of improvement in terms of the probable depth 10/4; using Santyl under the wound VAC. Comes in today with a fairly dramatic improvement in surface area and marked improvement in the undermining area as well as condition of the wound bed. This is gratifying to see in view of this were going to continue the Santyl under the [facility] for another week. Objective Constitutional Sitting or standing Blood Pressure is within target range for patient.. Pulse regular and within target range for patient.Marland Kitchen Respirations regular, non-labored and within target range.. Temperature is normal and within the target range for the patient.Marland Kitchen Appears in no distress. Vitals Time Taken: 12:49 PM, Height: 67 in, Weight: 257 lbs, BMI: 40.2, Temperature: 98.4 F, Pulse: 99 bpm, Respiratory Rate: 18 breaths/min, Blood Pressure: 127/83 mmHg. General Notes: Wound exam; left breast area. Surgical wound complicated by prior radiation. Much improved wound surface undermining is down to a few millimeters. Surface area also much better Integumentary (Hair, Skin) Wound #1 status is Open. Original cause of wound was Surgical Injury. The date acquired was: 01/16/2020. The wound has been in treatment 26 weeks. The wound is located on the Left Chest. The wound measures 2cm length x 2.2cm width x 0.5cm depth; 3.456cm^2 area and 1.728cm^3 volume. There is Fat Layer (Subcutaneous Tissue) exposed. There is no tunneling noted, however, there is undermining starting at 10:00 and ending at 3:00 with a maximum distance of 0.4cm. There is a medium amount of serosanguineous drainage noted. The wound margin is well defined and not attached to the wound base. There is large (67- 100%) red, pink granulation within the wound bed. There is a small (1-33%) amount of necrotic tissue within the wound bed including Adherent Slough. 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 - Tuesday with Dr. Dellia Nims Nurse Visit: - Vac dressing change Friday 10/11/20 Bathing/ Shower/ Hygiene: May shower with protection but do not get wound dressing(s) wet. Negative Presssure Wound Therapy: Wound Vac to wound continuously at 140m/hg pressure Black Foam - Apply Santyl to wound bed, under foam WOUND #1: - Chest Wound Laterality: Left Cleanser: Wound Cleanser 2 x Per Week/15 Days Discharge Instructions: Cleanse the wound with wound cleanser prior to applying a clean dressing using gauze sponges, not tissue or cotton balls. Prim Dressing: Santyl Ointment 2 x Per Week/15 Days ary Discharge Instructions: Apply nickel thick amount to wound bed under vac foam 1. I have continue Santyl in the wound VAC. 2. We have TheraSkin in reserve I thought I might applied today but the improvement in the overall condition of the wound  led me to put that off perhaps until next week Electronic Signature(s) Signed: 10/09/2020 10:14:24 AM By: [name] MD Entered By: [name] on 10/08/2020 13:24:07 -------------------------------------------------------------------------------- SuperBill Details Patient Name: Date of Service: [name] [name]. 10/08/2020 Medical Record Number: [id number] Patient Account Number: [id number] Date of Birth/Sex: Treating RN: Feb 15, 1970 (50 y.o. 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 Facility Procedures CPT4 Code: 84720721 Description: 82883 - WOUND VAC-50 SQ CM OR LESS ICD-10 Diagnosis Description T81.31XD Disruption of external operation (surgical) wound, not elsewhere classified, subse Modifier: quent encounter Quantity: 1 Physician Procedures : CPT4 Code Description Modifier [id number] - WC PHYS LEVEL 3 - EST PT ICD-10 Diagnosis Description T81.31XD Disruption of external operation (surgical) wound, not elsewhere classified, subsequent encounter L59.8 Other specified disorders of the skin  and subcutaneous tissue related to radiation Quantity: 1 Electronic Signature(s) Signed: 10/08/2020 5:11:46 PM By: Lorrin Jackson Signed: 10/09/2020 10:14:24 AM By: [name] MD Entered By: Lorrin Jackson on 10/08/2020 13:30:20

## 2020-10-09 NOTE — Progress Notes (Signed)
[name], [name] ([id number]) Visit Report for 10/08/2020 Arrival Information Details Patient Name: Date of Service: [name] [name] 10/08/2020 12:30 PM Medical Record Number: [id number] Patient Account Number: [id number] Date of Birth/Sex: Treating RN: July 29, 1970 (50 y.o. [name] Primary Care Sahvanna Mcmanigal: [name] Other Clinician: Referring [name]: Treating Seferino Oscar/Extender: [name] in Treatment: 26 Visit Information History Since Last Visit Added or deleted any medications: No Patient Arrived: Ambulatory Any new allergies or adverse reactions: No Arrival Time: 12:46 Had Sarah fall or experienced change in No Transfer Assistance: None activities of daily living that may affect Patient Identification Verified: Yes risk of falls: Secondary Verification Process Completed: Yes Signs or symptoms of abuse/neglect since last visito No Patient Requires Transmission-Based Precautions: No Hospitalized since last visit: No Patient Has Alerts: No Implantable device outside of the clinic excluding No cellular tissue based products placed in the center since last visit: Has Dressing in Place as Prescribed: Yes Pain Present Now: No Electronic Signature(s) Signed: 10/08/2020 5:11:46 PM By: Lorrin Jackson Entered By: Lorrin Jackson on 10/08/2020 12:46:57 -------------------------------------------------------------------------------- Encounter Discharge Information Details Patient Name: Date of Service: Sarah Crochet V [name] 10/08/2020 12:30 PM Medical Record Number: [id number] Patient Account Number: [id number] Date of Birth/Sex: Treating RN: [birth year] (50 y.o. [name] Primary Care [name]: [name] Other Clinician: Referring [name]: Treating Vernie Vinciguerra/Extender: [name] in Treatment: 26 Encounter Discharge Information Items Discharge Condition: Stable Ambulatory Status:  Ambulatory Discharge Destination: Home Transportation: Private Auto Schedule Follow-up Appointment: Yes Clinical Summary of Care: Provided on 10/08/2020 Form Type Recipient Paper Patient Patient Electronic Signature(s) Signed: 10/08/2020 5:11:46 PM By: Lorrin Jackson Entered By: Lorrin Jackson on 10/08/2020 13:30:55 -------------------------------------------------------------------------------- Lower Extremity Assessment Details Patient Name: Date of Service: Sarah Crochet V [name] 10/08/2020 12:30 PM Medical Record Number: [id number] Patient Account Number: [id number] Date of Birth/Sex: Treating RN: Oct 04, 1970 (50 y.o. [name] Primary Care [name]: [name] Other Clinician: Referring [name]: Treating Lannah Koike/Extender: [name] in Treatment: 26 Electronic Signature(s) Signed: 10/08/2020 5:11:46 PM By: Lorrin Jackson Entered By: Lorrin Jackson on 10/08/2020 12:50:49 -------------------------------------------------------------------------------- Multi Wound Chart Details Patient Name: Date of Service: Sarah Crochet V [name] 10/08/2020 12:30 PM Medical Record Number: [id number] Patient Account Number: [id number] Date of Birth/Sex: Treating RN: [birth year] (50 y.o. [name], [name] Primary Care Kynzee Devinney: [name] Other Clinician: Referring [name]: Treating [name]/Extender: [name] in Treatment: 26 Vital Signs Height(in): 76 Pulse(bpm): 99 Weight(lbs): 700 Blood Pressure(mmHg): 127/83 Body Mass Index(BMI): 40 Temperature(F): 98.4 Respiratory Rate(breaths/min): 18 Photos: [N/Sarah:N/Sarah] Left Chest N/Sarah N/Sarah Wound Location: Surgical Injury N/Sarah N/Sarah Wounding Event: Open Surgical Wound N/Sarah N/Sarah Primary Etiology: Necrosis (Radiation) N/Sarah N/Sarah Secondary Etiology: Hypertension, Osteoarthritis, ReceivedN/Sarah N/Sarah Comorbid History: Chemotherapy, Received Radiation 01/16/2020 N/Sarah N/Sarah Date  Acquired: 60 N/Sarah N/Sarah Weeks of Treatment: Open N/Sarah N/Sarah Wound Status: 2x2.2x0.5 N/Sarah N/Sarah Measurements L x W x D (cm) 3.456 N/Sarah N/Sarah Sarah (cm) : rea 1.728 N/Sarah N/Sarah Volume (cm) : 45.70% N/Sarah N/Sarah % Reduction in Sarah rea: 75.30% N/Sarah N/Sarah % Reduction in Volume: 10 Starting Position 1 (o'clock): 3 Ending Position 1 (o'clock): 0.4 Maximum Distance 1 (cm): Yes N/Sarah N/Sarah Undermining: Full Thickness Without Exposed N/Sarah N/Sarah Classification: Support Structures Medium N/Sarah N/Sarah Exudate Amount: Serosanguineous N/Sarah N/Sarah Exudate Type: red, brown N/Sarah N/Sarah Exudate Color: Well defined, not attached N/Sarah N/Sarah Wound Margin: Large (67-100%) N/Sarah N/Sarah Granulation  Amount: Red, Pink N/Sarah N/Sarah Granulation Quality: Small (1-33%) N/Sarah N/Sarah Necrotic Amount: Fat Layer (Subcutaneous Tissue): Yes N/Sarah N/Sarah Exposed Structures: Fascia: No Tendon: No Muscle: No Joint: No Bone: No Small (1-33%) N/Sarah N/Sarah Epithelialization: Treatment Notes Electronic Signature(s) Signed: 10/08/2020 5:26:57 PM By: Rhae Hammock RN Signed: 10/09/2020 10:14:24 AM By: [name] MD Entered By: [name] on 10/08/2020 13:17:10 -------------------------------------------------------------------------------- Multi-Disciplinary Care Plan Details Patient Name: Date of Service: Sarah Leisure, [name] 10/08/2020 12:30 PM Medical Record Number: [id number] Patient Account Number: [id number] Date of Birth/Sex: Treating RN: 05/29/1970 (50 y.o. [name] Primary Care Rosann Gorum: [name] Other Clinician: Referring [place] Capers: Treating [name]/Extender: [name] in Treatment: 26 Active Inactive Wound/Skin Impairment Nursing Diagnoses: Impaired tissue integrity Knowledge deficit related to smoking impact on wound healing Knowledge deficit related to ulceration/compromised skin integrity Goals: Patient will demonstrate Sarah reduced rate of smoking or cessation of smoking Date Initiated:  04/09/2020 Date Inactivated: 06/18/2020 Target Resolution Date: 06/28/2020 Goal Status: Met Patient will have Sarah decrease in wound volume by X% from date: (specify in notes) Date Initiated: 04/09/2020 Date Inactivated: 07/03/2020 Target Resolution Date: 07/05/2020 Goal Status: Met Patient/caregiver will verbalize understanding of skin care regimen Date Initiated: 04/09/2020 Target Resolution Date: 10/29/2020 Goal Status: Active Ulcer/skin breakdown will have Sarah volume reduction of 30% by week 4 Date Initiated: 04/09/2020 Date Inactivated: 05/14/2020 Target Resolution Date: 05/11/2020 Unmet Reason: see wound Goal Status: Unmet meaurements. Interventions: Assess patient/caregiver ability to obtain necessary supplies Assess patient/caregiver ability to perform ulcer/skin care regimen upon admission and as needed Assess ulceration(s) every visit Provide education on smoking Notes: Electronic Signature(s) Signed: 10/08/2020 5:11:46 PM By: Lorrin Jackson Entered By: Lorrin Jackson on 10/08/2020 12:46:11 -------------------------------------------------------------------------------- Negative Pressure Wound Therapy Maintenance (NPWT) Details Patient Name: Date of Service: Sarah Lourdes Sledge, [name] 10/08/2020 12:30 PM Medical Record Number: [id number] Patient Account Number: [id number] Date of Birth/Sex: Treating RN: Aug 27, 1970 (50 y.o. [name] Primary Care Gage Treiber: [name] Other Clinician: Referring Normon Pettijohn: Treating Sudeep Scheibel/Extender: [name] in Treatment: 26 NPWT Maintenance Performed for: Wound #1 Left Chest Performed By: Lorrin Jackson, RN Type: VAC System Coverage Size (sq cm): 4.4 Pressure Type: Constant Pressure Setting: 125 mmHG Drain Type: None Primary Contact: Other : Santyl Sponge/Dressing Type: Foam, Black Date Initiated: 08/21/2020 Dressing Removed: Yes Canister Changed: Yes Dressing Reapplied: Yes Quantity of Sponges/Gauze  Inserted: 2 Days On NPWT : 46 Post Procedure Diagnosis Same as Pre-procedure Electronic Signature(s) Signed: 10/08/2020 5:11:46 PM By: Lorrin Jackson Entered By: Lorrin Jackson on 10/08/2020 13:29:32 -------------------------------------------------------------------------------- Pain Assessment Details Patient Name: Date of Service: Sarah Crochet V [name] 10/08/2020 12:30 PM Medical Record Number: [id number] Patient Account Number: [id number] Date of Birth/Sex: Treating RN: May 02, 1970 (50 y.o. [name] Primary Care Daveena Elmore: [name] Other Clinician: Referring Gabriana Wilmott: Treating Dhiren Azimi/Extender: [name] in Treatment: 26 Active Problems Location of Pain Severity and Description of Pain Patient Has Paino No Site Locations Pain Management and Medication Current Pain Management: Electronic Signature(s) Signed: 10/08/2020 5:11:46 PM By: Lorrin Jackson Entered By: Lorrin Jackson on 10/08/2020 12:50:42 -------------------------------------------------------------------------------- Patient/Caregiver Education Details Patient Name: Date of Service: [name] 10/4/2022andnbsp12:30 PM Medical Record Number: [id number] Patient Account Number: [id number] Date of Birth/Gender: Treating RN: Feb 24, 1970 (50 y.o. [name] Primary Care Physician: [name] Other Clinician: Referring Physician: Treating Physician/Extender: [name] in Treatment: 26 Education Assessment  Education Provided To: Patient Education Topics Provided Wound/Skin Impairment: Methods: Demonstration, Explain/Verbal, Printed Responses: State content correctly Electronic Signature(s) Signed: 10/08/2020 5:11:46 PM By: Lorrin Jackson Entered By: Lorrin Jackson on 10/08/2020 12:46:38 -------------------------------------------------------------------------------- Wound Assessment Details Patient Name: Date of  Service: Sarah Crochet V [name] 10/08/2020 12:30 PM Medical Record Number: [id number] Patient Account Number: [id number] Date of Birth/Sex: Treating RN: 07-15-1970 (50 y.o. [name] Primary Care Suheyb Raucci: [name] Other Clinician: Referring Elchanan Bob: Treating Tita Terhaar/Extender: [name] in Treatment: 26 Wound Status Wound Number: 1 Primary Open Surgical Wound Etiology: Wound Location: Left Chest Secondary Necrosis (Radiation) Wounding Event: Surgical Injury Etiology: Date Acquired: 01/16/2020 Wound Status: Open Weeks Of Treatment: 26 Comorbid Hypertension, Osteoarthritis, Received Chemotherapy, Clustered Wound: No History: Received Radiation Photos Wound Measurements Length: (cm) 2 Width: (cm) 2.2 Depth: (cm) 0.5 Area: (cm) 3.456 Volume: (cm) 1.728 % Reduction in Area: 45.7% % Reduction in Volume: 75.3% Epithelialization: Small (1-33%) Tunneling: No Undermining: Yes Starting Position (o'clock): 10 Ending Position (o'clock): 3 Maximum Distance: (cm) 0.4 Wound Description Classification: Full Thickness Without Exposed Support Structures Wound Margin: Well defined, not attached Exudate Amount: Medium Exudate Type: Serosanguineous Exudate Color: red, brown Foul Odor After Cleansing: No Slough/Fibrino Yes Wound Bed Granulation Amount: Large (67-100%) Exposed Structure Granulation Quality: Red, Pink Fascia Exposed: No Necrotic Amount: Small (1-33%) Fat Layer (Subcutaneous Tissue) Exposed: Yes Necrotic Quality: Adherent Slough Tendon Exposed: No Muscle Exposed: No Joint Exposed: No Bone Exposed: No Treatment Notes Wound #1 (Chest) Wound Laterality: Left Cleanser Wound Cleanser Discharge Instruction: Cleanse the wound with wound cleanser prior to applying Sarah clean dressing using gauze sponges, not tissue or cotton balls. Peri-Wound Care Topical Primary Dressing Santyl Ointment Discharge Instruction: Apply nickel  thick amount to wound bed under vac foam Secondary Dressing Secured With Compression Wrap Compression Stockings Add-Ons Electronic Signature(s) Signed: 10/08/2020 5:11:46 PM By: Lorrin Jackson Entered By: Lorrin Jackson on 10/08/2020 12:56:49 -------------------------------------------------------------------------------- Vitals Details Patient Name: Date of Service: Sarah Crochet V [name] 10/08/2020 12:30 PM Medical Record Number: [id number] Patient Account Number: [id number] Date of Birth/Sex: Treating RN: [birth year] (50 y.o. [name] Primary Care Shrika Milos: [name] Other Clinician: Referring [place] Mcphee: Treating Blessed Girdner/Extender: [name] in Treatment: 26 Vital Signs Time Taken: 12:49 Temperature (F): 98.4 Height (in): 67 Pulse (bpm): 99 Weight (lbs): 257 Respiratory Rate (breaths/min): 18 Body Mass Index (BMI): 40.2 Blood Pressure (mmHg): 127/83 Reference Range: 80 - 120 mg / dl Electronic Signature(s) Signed: 10/08/2020 5:11:46 PM By: Lorrin Jackson Entered By: Lorrin Jackson on 10/08/2020 12:50:13

## 2020-10-10 ENCOUNTER — Other Ambulatory Visit (HOSPITAL_COMMUNITY): Payer: Self-pay

## 2020-10-11 ENCOUNTER — Encounter (HOSPITAL_BASED_OUTPATIENT_CLINIC_OR_DEPARTMENT_OTHER): Payer: Medicaid Other | Admitting: Internal Medicine

## 2020-10-11 ENCOUNTER — Other Ambulatory Visit: Payer: Self-pay

## 2020-10-11 ENCOUNTER — Other Ambulatory Visit (HOSPITAL_COMMUNITY): Payer: Self-pay

## 2020-10-11 DIAGNOSIS — T8131XA Disruption of external operation (surgical) wound, not elsewhere classified, initial encounter: Secondary | ICD-10-CM | POA: Diagnosis not present

## 2020-10-14 NOTE — Progress Notes (Signed)
[name], [name] ([id number]) Visit Report for 10/11/2020 SuperBill Details Patient Name: Date of Service: [name] 10/11/2020 Medical Record Number: [id number] Patient Account Number: [id number] Date of Birth/Sex: Treating RN: 1970-05-03 (50 y.o. 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

## 2020-10-14 NOTE — Progress Notes (Signed)
[facility], [facility]AM ([id number]) Visit Report for 10/11/2020 Arrival Information Details Patient Name: Date of Service: [name] [name] 10/11/2020 9:15 Sarah M Medical Record Number: [id number] Patient Account Number: [id number] Date of Birth/Sex: Treating RN: 10-Jun-1970 (50 y.o. [name], [name] Primary Care Ahad Colarusso: [name] Other Clinician: Referring Misbah Hornaday: Treating Merinda Victorino/Extender: [name] in Treatment: 78 Visit Information History Since Last Visit Added or deleted any medications: No Patient Arrived: Ambulatory Any new allergies or adverse reactions: No Arrival Time: 09:51 Had Sarah fall or experienced change in No Accompanied By: alone activities of daily living that may affect Transfer Assistance: None risk of falls: Patient Identification Verified: Yes Signs or symptoms of abuse/neglect since last visito No Secondary Verification Process Completed: Yes Hospitalized since last visit: No Patient Requires Transmission-Based Precautions: No Implantable device outside of the clinic excluding No Patient Has Alerts: No cellular tissue based products placed in the center since last visit: Has Dressing in Place as Prescribed: Yes Pain Present Now: No Electronic Signature(s) Signed: 10/14/2020 4:58:30 PM By: Levan Hurst RN, BSN Entered By: Levan Hurst on 10/11/2020 12:13:30 -------------------------------------------------------------------------------- Encounter Discharge Information Details Patient Name: Date of Service: Sarah [name] 10/11/2020 9:15 Sarah M Medical Record Number: [id number] Patient Account Number: [id number] Date of Birth/Sex: Treating RN: [birth year] (50 y.o. [name] Primary Care Adela Esteban: [name] Other Clinician: Referring Rosa Wyly: Treating Dalonda Simoni/Extender: [name] in Treatment: 26 Encounter Discharge Information Items Discharge Condition:  Stable Ambulatory Status: Ambulatory Discharge Destination: Home Transportation: Private Auto Accompanied By: alone Schedule Follow-up Appointment: Yes Clinical Summary of Care: Patient Declined Electronic Signature(s) Signed: 10/14/2020 4:58:30 PM By: Levan Hurst RN, BSN Entered By: Levan Hurst on 10/11/2020 12:18:01 -------------------------------------------------------------------------------- Negative Pressure Wound Therapy Maintenance (NPWT) Details Patient Name: Date of Service: [name] [name] 10/11/2020 9:15 Sarah M Medical Record Number: [id number] Patient Account Number: [id number] Date of Birth/Sex: Treating RN: [birth year] (50 y.o. [name] Primary Care Aariya Ferrick: [name] Other Clinician: Referring Tarrin Lebow: Treating Christyan Reger/Extender: [name] in Treatment: 26 NPWT Maintenance Performed for: Wound #1 Left Chest Performed By: Levan Hurst, RN Type: VAC System Coverage Size (sq cm): 4.4 Pressure Type: Constant Pressure Setting: 125 mmHG Drain Type: None Primary Contact: Other : Santyl Sponge/Dressing Type: Foam, Black Date Initiated: 08/21/2020 Dressing Removed: No Quantity of Sponges/Gauze Removed: 1 piece black foam Canister Changed: No Dressing Reapplied: Yes Quantity of Sponges/Gauze Inserted: 1 piece black foam Respones T Treatment: o pt tolerated well Days On NPWT : 52 Electronic Signature(s) Signed: 10/14/2020 4:58:30 PM By: Levan Hurst RN, BSN Entered By: Levan Hurst on 10/11/2020 12:17:13 -------------------------------------------------------------------------------- Wound Assessment Details Patient Name: Date of Service: Sarah Crochet V [name] 10/11/2020 9:15 Sarah M Medical Record Number: [id number] Patient Account Number: [id number] Date of Birth/Sex: Treating RN: 1970/08/01 (50 y.o. [name] Primary Care Hurman Ketelsen: [name] Other Clinician: Referring  Janin Kozlowski: Treating Deshanta Lady/Extender: [name] in Treatment: 26 Wound Status Wound Number: 1 Primary Open Surgical Wound Etiology: Wound Location: Left Chest Secondary Necrosis (Radiation) Wounding Event: Surgical Injury Etiology: Date Acquired: 01/16/2020 Wound Status: Open Weeks Of Treatment: 26 Comorbid Hypertension, Osteoarthritis, Received Chemotherapy, Clustered Wound: No History: Received Radiation Wound Measurements Length: (cm) 2 Width: (cm) 2.2 Depth: (cm) 0.5 Area: (cm) 3.456 Volume: (cm) 1.728 % Reduction in Area: 45.7% % Reduction in Volume: 75.3% Epithelialization: Small (1-33%) Tunneling: No Undermining:  No Wound Description Classification: Full Thickness Without Exposed Support Structur Wound Margin: Well defined, not attached Exudate Amount: Medium Exudate Type: Serosanguineous Exudate Color: red, brown Wound Bed Granulation Amount: Large (67-100%) Granulation Quality: Red, Pink Necrotic Amount: Small (1-33%) Necrotic Quality: Adherent Slough es Foul Odor After Cleansing: No Slough/Fibrino Yes Exposed Structure Fascia Exposed: No Fat Layer (Subcutaneous Tissue) Exposed: Yes Tendon Exposed: No Muscle Exposed: No Joint Exposed: No Bone Exposed: No Treatment Notes Wound #1 (Chest) Wound Laterality: Left Cleanser Wound Cleanser Discharge Instruction: Cleanse the wound with wound cleanser prior to applying Sarah clean dressing using gauze sponges, not tissue or cotton balls. Peri-Wound Care Topical Primary Dressing Santyl Ointment Discharge Instruction: Apply nickel thick amount to wound bed under vac foam Secondary Dressing Secured With Compression Wrap Compression Stockings Add-Ons Electronic Signature(s) Signed: 10/14/2020 4:58:30 PM By: Levan Hurst RN, BSN Entered By: Levan Hurst on 10/11/2020 12:14:38 -------------------------------------------------------------------------------- Vitals  Details Patient Name: Date of Service: Sarah Crochet V [name] 10/11/2020 9:15 Sarah M Medical Record Number: [id number] Patient Account Number: [id number] Date of Birth/Sex: Treating RN: Apr 14, 1970 (50 y.o. [name] Primary Care Shonn Farruggia: [name] Other Clinician: Referring Trustin Chapa: Treating Derrisha Foos/Extender: [name] in Treatment: 26 Vital Signs Time Taken: 09:51 Temperature (F): 98.6 Height (in): 67 Pulse (bpm): 96 Weight (lbs): 257 Respiratory Rate (breaths/min): 16 Body Mass Index (BMI): 40.2 Blood Pressure (mmHg): 125/83 Reference Range: 80 - 120 mg / dl Electronic Signature(s) Signed: 10/14/2020 4:58:30 PM By: Levan Hurst RN, BSN Entered By: Levan Hurst on 10/11/2020 12:14:02

## 2020-10-15 ENCOUNTER — Other Ambulatory Visit: Payer: Self-pay

## 2020-10-15 ENCOUNTER — Encounter (HOSPITAL_BASED_OUTPATIENT_CLINIC_OR_DEPARTMENT_OTHER): Payer: Medicaid Other | Admitting: Internal Medicine

## 2020-10-15 DIAGNOSIS — T8131XA Disruption of external operation (surgical) wound, not elsewhere classified, initial encounter: Secondary | ICD-10-CM | POA: Diagnosis not present

## 2020-10-15 DIAGNOSIS — T8131XD Disruption of external operation (surgical) wound, not elsewhere classified, subsequent encounter: Secondary | ICD-10-CM | POA: Diagnosis not present

## 2020-10-15 DIAGNOSIS — Z9010 Acquired absence of unspecified breast and nipple: Secondary | ICD-10-CM | POA: Diagnosis not present

## 2020-10-15 DIAGNOSIS — L598 Other specified disorders of the skin and subcutaneous tissue related to radiation: Secondary | ICD-10-CM

## 2020-10-15 DIAGNOSIS — D0512 Intraductal carcinoma in situ of left breast: Secondary | ICD-10-CM | POA: Diagnosis not present

## 2020-10-15 NOTE — Progress Notes (Signed)
[name], MACIVER ([id number]) Visit Report for 10/15/2020 Chief Complaint Document Details Patient Name: Date of Service: [name] 10/15/2020 1:15 PM Medical Record Number: [id number] Patient Account Number: [id number] Date of Birth/Sex: Treating RN: July 10, 1970 (50 y.o. [name] Primary Care Provider: [name] Other Clinician: Referring Provider: Treating Provider/Extender: [name] in Treatment: 27 Information Obtained from: Patient Chief Complaint 04/09/2020; patient is here for review of an area of wound dehiscence in the left breast mastectomy scar Electronic Signature(s) Signed: 10/15/2020 2:35:34 PM By: Kalman Shan DO Entered By: Kalman Shan on 10/15/2020 14:31:18 -------------------------------------------------------------------------------- Debridement Details Patient Name: Date of Service: [name] V [name] 10/15/2020 1:15 PM Medical Record Number: [id number] Patient Account Number: [id number] Date of Birth/Sex: Treating RN: 1970/04/19 (50 y.o. [name] Primary Care Provider: [name] Other Clinician: Referring Provider: Treating Provider/Extender: [name] in Treatment: 27 Debridement Performed for Assessment: Wound #1 Left Chest Performed By: Clinician Deon Pilling, RN Debridement Type: Chemical/Enzymatic/Mechanical Agent Used: Santyl Level of Consciousness (Pre-procedure): Awake and Alert Pre-procedure Verification/Time Out No Taken: Bleeding: None Response to Treatment: Procedure was tolerated well Level of Consciousness (Post- Awake and Alert procedure): Post Debridement Measurements of Total Wound Length: (cm) 2.1 Width: (cm) 2 Depth: (cm) 0.5 Volume: (cm) 1.649 Character of Wound/Ulcer Post Debridement: Stable Post Procedure Diagnosis Same as Pre-procedure Electronic Signature(s) Signed: 10/15/2020 2:35:34 PM By: Kalman Shan  DO Signed: 10/15/2020 5:07:29 PM By: Deon Pilling RN, BSN Entered By: Deon Pilling on 10/15/2020 14:02:42 -------------------------------------------------------------------------------- HPI Details Patient Name: Date of Service: [name], [name] 10/15/2020 1:15 PM Medical Record Number: [id number] Patient Account Number: [id number] Date of Birth/Sex: Treating RN: [birth year] (50 y.o. [name] Primary Care Provider: [name] Other Clinician: Referring Provider: Treating Provider/Extender: [name] in Treatment: 27 History of Present Illness HPI Description: After canADMISSION 04/09/2020 This is a 50 year old woman who is history of breast cancer in the left breast goes back to 2018. He was found to have a mass in her left breast. She underwent a left back lumpectomy and then subsequent chemo that she did not tolerate very well. She had seed implants placed but then underwent external beam a radiation in the spring 2019. 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 Her adjunctive radiation was from 03/11/2017 through 04/21/2017. Unfortunately she had a left breast biopsy on 11/24/2019 and it was again positive for an invasive ductal carcinoma grade 3 he is to resume at receptor 80% positive. HER-2 negative progesterone negative. She opted for bilateral mastectomies which were done on 01/09/2020 on the right with no evidence of malignancy on the left stage IIb invasive ductal carcinoma. She again received chemotherapy but she did not tolerate it because of wound dehiscence and low platelet counts according to the patient. Unfortunately her mastectomy wound dehisced on the left breast. She was taken back to the OR by [name] of general surgery on 33/24/22 and she underwent a debridement and subsequent attempt at  closure. She had been previously treated with Augmentin for suspected underlying infection. Once again the wound has dehisced in the mid aspect. It is roughly 1 cm deep there is tunnels both medially and laterally she. She still has sutures in place. Still describes constant pain drainage. She also has  a drain still in place that drains about 10 cc/day. Currently she is changing ABD pad several times a day 4/12; patient went back to see [name] who remove most of the stitches and debrided her wound. She has been using silver alginate culture I did last time which was a PCR culture showed Peptostreptococcus, Enterococcus faecalis and Staphylococcus epidermidis which is of questionable significance. She has a larger wound now that the sutures have been removed. There are undermining areas superior medial superior lateral and a deep tunneling area in the inferior part of the wound. There is no obvious purulence. I started her on Augmentin on 04/12/2020 and I will likely extend this for another week AMAZINGLY; she is already been approved for HBO and will start today. [name] Echocardiogram which I reviewed from 2019 showed a normal ejection fraction.. I went over side effects of HBO with the patient and answered any of her questions. Her husband was present 5/3; its been a while since have seen this wound. She went to see [name] about 2-1/2 weeks ago according to the patient he can remove some of the surrounding debris. She will see him again later this week or early next week. She has been using silver alginate. A lot of this is problematic since she has Medicaid which traditionally does not cover wound care supplies although it traditionally does not cover HBO either Her wound actually looks quite a bit better than the last time I saw this better looking surface healthy looking tissue. She has tunnels at 810 2 in 6 the deepest of these is at 2 at 2.2 cm there is no  exposed bone. 5/10; the patient was coming in for nurse visit today but requested that I see the wound because of surface discoloration. She states the Iodoflex makes the surface of this turned "white". She is not really complaining of undue tenderness. She is changing this every second day. The goal of this had been to get the surface of the wound to a vibrant red color and then potentially look at changing to a silver collagen-based dressing, Puraply, and then perhaps an alternative skin substituteo Derma vest. The patient tells me she has an appoint with her oncologist [name] on the 19th. She apparently think she is going to get another PET scan. Based on this I would like his comment about whether there is possibility of any malignancy in the wound itself or just underneath it. Up to now we have not really thought that was true 5/17; nice improvement in the chest wound. Less depth. We have been using Iodoflex to clean off the wound surface this seems to have done nicely. Patient went to see [name] 2 days ago who has referred her to see Dr. Claudia Desanctis. I certainly agree with the thought the surface of the wound looks a lot better now and I think consideration of plastic surgery is certainly something the patient should discuss. I have talked to the patient about this today and I certainly support a conversation with Dr. Claudia Desanctis. I also wondered about is there any residual cancer in this. [name] apparently did not seem to think so per the patient although I do not see that in the text conversations we have had. I also wonder about a skin substitute if her version of Medicare [[employer]] would cover this. [name] is apparently considering starting her on [place]. Patient is already very scared about the side effects but she will  discuss this with [name] on 5/19 5/31; patient's wound about the same in terms of size she still has the tunneling area to the left. Surface of the wound  looks a lot better there has been some filling in the granulation I have been included on serial text between Dr. Callie Fielding surgery] Dr. Ruffin Frederick oncology] and Dr. Para Skeans surgery]. I think there is not a lot of thought that the patient has recurrence of her cancer her MRI was indeterminant but [name] did not feel that there was any reason to think she has a major recurrence and he was not in favor of doing a dedicated breast MRI. Dr. Claudia Desanctis wants to go ahead with a flap closure I did it. I have not reviewed his note but per the patient this is a latissimus dorsi myocutaneous flap or I think a skin flap from her abdomen. I have talked to the patient I think it is time to attempt this. If I can continue hyperbarics surrounding the flap then we will attempt to do this 6/14; patient using Prisma as the primary dressing here. She has completed hyperbarics and did very well. Her wound is responded nicely. The depth of this and the condition of the granulation tissue looks a lot better. She has undermining areas at 2 and 10:00 both of these seem to be better as well. She is scheduled for flap closure on 7/11 by Dr. Claudia Desanctis 6/29; the patient continues to use Prisma wet-to-dry. Her graft is scheduled for 7/11 by Dr. Claudia Desanctis. We are looking at getting her additional hyperbaric treatment which I think might help with the likelihood of graft survival in this difficult irradiated area 7/21; patient returns to clinic to reinitiate HBO after her surgical procedure by Dr. Claudia Desanctis. On 07/15/2020 she had debridement of left chest wound and a rotational flap closure. She also had excision of right excess axillary tissue. Pathology did not show any malignancy and follow-up Dr. Claudia Desanctis saw the patient yesterday. He noted a 2 cm area of wound dehiscence. Recommended she return for hyperbaric oxygen and she returns today for her first treatment. Follow-up with him in 2 weeks. We were not actually going to  see the patient today her appointment to be evaluated was Monday however she was concerned about a "fishy odor". She is also noted some drainage. Not specifically dressing in the wound area but covering with Adaptic 7/25; I am seeing this wound and follow-up. So far her culture is polymicrobial not growing any specific anything specific nevertheless with the Augmentin and the silver alginate her odor is a lot better. Not as much periwound tenderness. Dimensions measuring 2.7 x 3.3 x 1.6. She has undermining from 10-1 o'clock of about 1 and half centimeters. Necrotic fat on the surface of this... 08/13/2020 upon evaluation today patient appears to be doing decently well in regard to her wound all things considered. Obviously this is much more open than what was hoped for based on the fact that she had a flap performed which was supposed to completely close this wound. Nonetheless currently she tells me that she is not really having any significant pain fortunately the drainage is improved compared to last time there is still some evidence of odor as far as the drainage is concerned but is definitely not as purulent as what was previously noted. With that being said I do believe that there are quite a few sutures which are quite embedded. Nonetheless I believe this needs to be  removed. In fact if they are not I am afraid this is again actually grow into the skin and that is can to be even bigger issue. Some of them even so at this point appeared that there could be difficult to even get out. 8/17; this is the first time I am seeing this in about 3 weeks. The patient had stop by my office yesterday to discuss after completing hyperbaric therapy. Based on what she said I was expecting the worst today however apparently with dressing changes and perhaps debridement last week the surface of the wound actually looks better than what I was expecting. There is considerable depth although the last time I saw this  the base of the wound was necrotic there is still some necrotic tissue on the surface of the wound bed however in general the rest the tissue looks healthy even with some bleeding. She has been using cleaning the wound with Dakin's and then applying Prisma and then backing Dakin's wet-to-dry. This seems to work to clean the wound surface up. They also ordered a wound VAC and I discussed this with her in depth today She is not systemically unwell. She is not running a fever there is no odor. She talks about pain over the lower part of the flap to touch. This does not appear to be related to movement of her shoulder. 8/23; the patient has been using the wound VAC for 1 week. Already a remarkable improvement and she appears to be tolerating this well we had Santyl under the wound VAC however the surface looks so clean today I am doubtful that is going to be necessary She is also being treated with HBO. This is helped her dramatically. Apparently her current certification will be up on 8/31. She is only had 19 dives and we will certainly need to recertify her up for this. Since I saw her last week I put in a secure text message to Dr. Claudia Desanctis through epic to let him know that we were going ahead with a wound VAC. He did not have any objections to this. He is offering her more surgery however in the eventuality that this does not close 8/30; the patient missed 2 treatments of HBO last week because of nausea. I think she is now on Compazine which seems to have helped. Otherwise she is tolerating HBO well. We are collagen under wound VAC. Direct depth of wound measured at 1.2 cm which I think is probably unchanged however the undermining area is down to 0.5 which I think is an improvement 9/6; patient continues with chemotherapy although she says she is troll of her nausea. We are using collagen with a wound VAC on the remaining wound on her chest. No major improvements in the undermining area from 12-5  currently measuring 0.5 cm. This is no better than last week 9/13; patient continuing with chemotherapy. She completes HBO today. We are using silver collagen under wound VAC. No major improvements however the undermining area seems somewhat better to me. 9/20; we are using silver collagen under the wound VAC and although about 60% of the wound looks healthy. At the base of the wound there is still a very adherent slough. She has been approved for TheraSkin which I would like to use if we can get the wound bed in a cleaner situation. 9/27; we are using Santyl under the wound VAC. Dimensions are slightly better including depth of 0.7 cm. Slight improvement in length and width the undermining area  currently at 12-2 done in itself and improvement measures 0.6 which is not too much of improvement in terms of the probable depth 10/4; using Santyl under the wound VAC. Comes in today with a fairly dramatic improvement in surface area and marked improvement in the undermining area as well as condition of the wound bed. This is gratifying to see in view of this were going to continue the Santyl under the [facility] for another week. 10/11; patient presents for follow-up. She reports continued wound healing with Santyl and wound VAC. She has no issues or complaints today. She denies signs of infection. Electronic Signature(s) Signed: 10/15/2020 2:35:34 PM By: Kalman Shan DO Entered By: Kalman Shan on 10/15/2020 14:31:50 -------------------------------------------------------------------------------- Physical Exam Details Patient Name: Date of Service: [name], Wonda Amis [name] 10/15/2020 1:15 PM Medical Record Number: [id number] Patient Account Number: [id number] Date of Birth/Sex: Treating RN: April 23, 1970 (50 y.o. [name] Primary Care Provider: [name] Other Clinician: Referring Provider: Treating Provider/Extender: [name] in Treatment:  27 Constitutional respirations regular, non-labored and within target range for patient.Marland Kitchen Psychiatric pleasant and cooperative. Notes Left breast area with open wound and granulation tissue to the most distal aspect. Overall appears healing. Minimal undermining noted. Surrounding soft tissue intact. No signs of infection. Electronic Signature(s) Signed: 10/15/2020 2:35:34 PM By: Kalman Shan DO Entered By: Kalman Shan on 10/15/2020 14:33:10 -------------------------------------------------------------------------------- Physician Orders Details Patient Name: Date of Service: [name] V [name] 10/15/2020 1:15 PM Medical Record Number: [id number] Patient Account Number: [id number] Date of Birth/Sex: Treating RN: 08/21/1970 (50 y.o. 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 - Tuesday with Dr. Dellia Nims 10/22/2020 Return A Nurse Visit: - Vac dressing change Friday 10/18/2020 Bathing/ Shower/ Hygiene May shower with protection but do not get wound dressing(s) wet. Negative Presssure Wound Therapy Wound Vac to wound continuously at 16m/hg pressure Black Foam - Apply Santyl to wound bed, under foam Wound Treatment Wound #1 - Chest Wound Laterality: Left Cleanser: Wound Cleanser 2 x Per Week/15 Days Discharge Instructions: Cleanse the wound with wound cleanser prior to applying a clean dressing using gauze sponges,  not tissue or cotton balls. Prim Dressing: Santyl Ointment 2 x Per Week/15 Days ary Discharge Instructions: Apply nickel thick amount to wound bed under vac foam Electronic Signature(s) Signed: 10/15/2020 2:35:34 PM By: HKalman ShanDO Entered By: HKalman Shanon 10/15/2020 14:33:22 -------------------------------------------------------------------------------- Problem List Details Patient Name: Date of Service: ARoyetta CrochetV [name] 10/15/2020 1:15 PM Medical Record Number: [id number] Account Number: [id number] of Birth/Sex: Treating RN: [birth year](50y.o. 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 10/15/2020 1:15 PM Medical Record Number: [id number] Patient Account Number: [id number] Date of Birth/Sex: Treating RN: [birth year] (50 y.o. [name], [name] Primary Care Provider: [name] Other Clinician: Referring Provider: Treating Provider/Extender: [name] in Treatment: 27 Subjective Chief Complaint Information  obtained from Patient 04/09/2020; patient is here for review of an area of wound dehiscence in the left breast mastectomy scar History of Present Illness (HPI) After canADMISSION 04/09/2020 This is a 50 year old woman who is history of breast cancer in the left breast goes back to 2018. He was found to have a mass in her left breast. She underwent a left back lumpectomy and then subsequent chemo that she did not tolerate very well. She had seed implants placed but then underwent external beam a radiation in the spring 2019. 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 Her adjunctive radiation was from 03/11/2017 through 04/21/2017. Unfortunately she had a left breast biopsy on 11/24/2019 and it was again positive for an invasive ductal carcinoma grade 3 he is to resume at receptor 80% positive. HER-2 negative progesterone negative. She opted for bilateral mastectomies which were done on 01/09/2020 on the right with no evidence of malignancy on the left stage IIb invasive ductal carcinoma. She again received chemotherapy but she did not tolerate it because of wound dehiscence and low platelet counts according to the patient. Unfortunately her mastectomy wound dehisced on the left breast. She was taken back to the OR by [name] of general surgery on 33/24/22 and she underwent a debridement and subsequent attempt at closure. She had been previously treated with Augmentin for suspected underlying infection. Once again the wound has dehisced in the mid aspect. It is roughly 1 cm deep there is tunnels both medially and laterally she. She still has sutures in place. Still describes constant pain drainage. She also has a drain still in place that drains about 10 cc/day. Currently she is changing ABD pad several times a day 4/12; patient went back to see [name] who remove  most of the stitches and debrided her wound. She has been using silver alginate culture I did last time which was a PCR culture showed Peptostreptococcus, Enterococcus faecalis and Staphylococcus epidermidis which is of questionable significance. She has a larger wound now that the sutures have been removed. There are undermining areas superior medial superior lateral and a deep tunneling area in the inferior part of the wound. There is no obvious purulence. I started her on Augmentin on 04/12/2020 and I will likely extend this for another week AMAZINGLY; she is already been approved for HBO and will start today. [name] Echocardiogram which I reviewed from 2019 showed a normal ejection fraction.. I went over side effects of HBO with the patient and answered any of her questions. Her husband was present 5/3; its been a while since have seen this wound. She went to see [name] about 2-1/2 weeks ago according to the patient he can remove some of the surrounding debris. She will see him again later this week or early next week. She has been using  silver alginate. A lot of this is problematic since she has Medicaid which traditionally does not cover wound care supplies although it traditionally does not cover HBO either Her wound actually looks quite a bit better than the last time I saw this better looking surface healthy looking tissue. She has tunnels at 810 2 in 6 the deepest of these is at 2 at 2.2 cm there is no exposed bone. 5/10; the patient was coming in for nurse visit today but requested that I see the wound because of surface discoloration. She states the Iodoflex makes the surface of this turned "white". She is not really complaining of undue tenderness. She is changing this every second day. The goal of this had been to get the surface of the wound to a vibrant red color and then potentially look at changing to a silver collagen-based dressing, Puraply, and then  perhaps an alternative skin substituteo Derma vest. The patient tells me she has an appoint with her oncologist [name] on the 19th. She apparently think she is going to get another PET scan. Based on this I would like his comment about whether there is possibility of any malignancy in the wound itself or just underneath it. Up to now we have not really thought that was true 5/17; nice improvement in the chest wound. Less depth. We have been using Iodoflex to clean off the wound surface this seems to have done nicely. Patient went to see [name] 2 days ago who has referred her to see Dr. Claudia Desanctis. I certainly agree with the thought the surface of the wound looks a lot better now and I think consideration of plastic surgery is certainly something the patient should discuss. I have talked to the patient about this today and I certainly support a conversation with Dr. Claudia Desanctis. I also wondered about is there any residual cancer in this. [name] apparently did not seem to think so per the patient although I do not see that in the text conversations we have had. I also wonder about a skin substitute if her version of Medicare [[employer]] would cover this. [name] is apparently considering starting her on [place]. Patient is already very scared about the side effects but she will discuss this with [name] on 5/19 5/31; patient's wound about the same in terms of size she still has the tunneling area to the left. Surface of the wound looks a lot better there has been some filling in the granulation I have been included on serial text between Dr. Callie Fielding surgery] Dr. Ruffin Frederick oncology] and Dr. Para Skeans surgery]. I think there is not a lot of thought that the patient has recurrence of her cancer her MRI was indeterminant but [name] did not feel that there was any reason to think she has a major recurrence and he was not in favor of doing a dedicated breast MRI. Dr.  Claudia Desanctis wants to go ahead with a flap closure I did it. I have not reviewed his note but per the patient this is a latissimus dorsi myocutaneous flap or I think a skin flap from her abdomen. I have talked to the patient I think it is time to attempt this. If I can continue hyperbarics surrounding the flap then we will attempt to do this 6/14; patient using Prisma as the primary dressing here. She has completed hyperbarics and did very well. Her wound is responded nicely. The depth of this and the condition of the granulation  tissue looks a lot better. She has undermining areas at 2 and 10:00 both of these seem to be better as well. She is scheduled for flap closure on 7/11 by Dr. Claudia Desanctis 6/29; the patient continues to use Prisma wet-to-dry. Her graft is scheduled for 7/11 by Dr. Claudia Desanctis. We are looking at getting her additional hyperbaric treatment which I think might help with the likelihood of graft survival in this difficult irradiated area 7/21; patient returns to clinic to reinitiate HBO after her surgical procedure by Dr. Claudia Desanctis. On 07/15/2020 she had debridement of left chest wound and a rotational flap closure. She also had excision of right excess axillary tissue. Pathology did not show any malignancy and follow-up Dr. Claudia Desanctis saw the patient yesterday. He noted a 2 cm area of wound dehiscence. Recommended she return for hyperbaric oxygen and she returns today for her first treatment. Follow-up with him in 2 weeks. We were not actually going to see the patient today her appointment to be evaluated was Monday however she was concerned about a "fishy odor". She is also noted some drainage. Not specifically dressing in the wound area but covering with Adaptic 7/25; I am seeing this wound and follow-up. So far her culture is polymicrobial not growing any specific anything specific nevertheless with the Augmentin and the silver alginate her odor is a lot better. Not as much periwound tenderness. Dimensions  measuring 2.7 x 3.3 x 1.6. She has undermining from 10-1 o'clock of about 1 and half centimeters. Necrotic fat on the surface of this... 08/13/2020 upon evaluation today patient appears to be doing decently well in regard to her wound all things considered. Obviously this is much more open than what was hoped for based on the fact that she had a flap performed which was supposed to completely close this wound. Nonetheless currently she tells me that she is not really having any significant pain fortunately the drainage is improved compared to last time there is still some evidence of odor as far as the drainage is concerned but is definitely not as purulent as what was previously noted. With that being said I do believe that there are quite a few sutures which are quite embedded. Nonetheless I believe this needs to be removed. In fact if they are not I am afraid this is again actually grow into the skin and that is can to be even bigger issue. Some of them even so at this point appeared that there could be difficult to even get out. 8/17; this is the first time I am seeing this in about 3 weeks. The patient had stop by my office yesterday to discuss after completing hyperbaric therapy. Based on what she said I was expecting the worst today however apparently with dressing changes and perhaps debridement last week the surface of the wound actually looks better than what I was expecting. There is considerable depth although the last time I saw this the base of the wound was necrotic there is still some necrotic tissue on the surface of the wound bed however in general the rest the tissue looks healthy even with some bleeding. She has been using cleaning the wound with Dakin's and then applying Prisma and then backing Dakin's wet-to-dry. This seems to work to clean the wound surface up. They also ordered a wound VAC and I discussed this with her in depth today She is not systemically unwell. She is not  running a fever there is no odor. She talks about pain over  the lower part of the flap to touch. This does not appear to be related to movement of her shoulder. 8/23; the patient has been using the wound VAC for 1 week. Already a remarkable improvement and she appears to be tolerating this well we had Santyl under the wound VAC however the surface looks so clean today I am doubtful that is going to be necessary She is also being treated with HBO. This is helped her dramatically. Apparently her current certification will be up on 8/31. She is only had 19 dives and we will certainly need to recertify her up for this. Since I saw her last week I put in a secure text message to Dr. Claudia Desanctis through epic to let him know that we were going ahead with a wound VAC. He did not have any objections to this. He is offering her more surgery however in the eventuality that this does not close 8/30; the patient missed 2 treatments of HBO last week because of nausea. I think she is now on Compazine which seems to have helped. Otherwise she is tolerating HBO well. We are collagen under wound VAC. Direct depth of wound measured at 1.2 cm which I think is probably unchanged however the undermining area is down to 0.5 which I think is an improvement 9/6; patient continues with chemotherapy although she says she is troll of her nausea. We are using collagen with a wound VAC on the remaining wound on her chest. No major improvements in the undermining area from 12-5 currently measuring 0.5 cm. This is no better than last week 9/13; patient continuing with chemotherapy. She completes HBO today. We are using silver collagen under wound VAC. No major improvements however the undermining area seems somewhat better to me. 9/20; we are using silver collagen under the wound VAC and although about 60% of the wound looks healthy. At the base of the wound there is still a very adherent slough. She has been approved for TheraSkin which  I would like to use if we can get the wound bed in a cleaner situation. 9/27; we are using Santyl under the wound VAC. Dimensions are slightly better including depth of 0.7 cm. Slight improvement in length and width the undermining area currently at 12-2 done in itself and improvement measures 0.6 which is not too much of improvement in terms of the probable depth 10/4; using Santyl under the wound VAC. Comes in today with a fairly dramatic improvement in surface area and marked improvement in the undermining area as well as condition of the wound bed. This is gratifying to see in view of this were going to continue the Santyl under the [facility] for another week. 10/11; patient presents for follow-up. She reports continued wound healing with Santyl and wound VAC. She has no issues or complaints today. She denies signs of infection. Patient History Information obtained from Patient. Family History Cancer - Father,Maternal Grandparents,Child, Diabetes - Mother,Father, Heart Disease - Father, Hypertension - Mother,Father, No family history of Kidney Disease, Lung Disease, Seizures, Stroke, Thyroid Problems, Tuberculosis. Social History Never smoker, Marital Status - Married, Alcohol Use - Never, Drug Use - No History, Caffeine Use - Rarely. Medical History Eyes Denies history of Cataracts, Glaucoma, Optic Neuritis Ear/Nose/Mouth/Throat Denies history of Chronic sinus problems/congestion, Middle ear problems Cardiovascular Patient has history of Hypertension - conditional Endocrine Denies history of Type I Diabetes, Type II Diabetes Genitourinary Denies history of End Stage Renal Disease Integumentary (Skin) Denies history of History of Burn Musculoskeletal  Patient has history of Osteoarthritis Oncologic Patient has history of Received Chemotherapy - 2018, Received Radiation - 2019 Psychiatric Denies history of Anorexia/bulimia, Confinement Anxiety Hospitalization/Surgery History - portacath  insertion. - ORIF right ankle. - left breast lumpectomy. - debridement and closure of mastectomy wound. - endoscopic mucosal resection. - mastectomy with sentinel node biopsy. - tubal ligation. - wisdom tooth extraction. 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 Vitals Time Taken: 1:51 PM, Height: 67 in, Weight: 257 lbs, BMI: 40.2, Temperature: 97.8 F, Pulse: 106 bpm, Respiratory Rate: 16 breaths/min, Blood Pressure: 167/85 mmHg. Psychiatric pleasant and cooperative. General Notes: Left breast area with open wound and granulation tissue to the most distal aspect. Overall appears healing. Minimal undermining noted. Surrounding soft tissue intact. No signs of infection. Integumentary (Hair, Skin) Wound #1 status is Open. Original cause of wound was Surgical Injury. The date acquired was: 01/16/2020. The wound has been in treatment 27 weeks. The wound is located on the Left Chest. The wound measures 2.1cm length x 2cm width x 0.5cm depth; 3.299cm^2 area and 1.649cm^3 volume. There is Fat Layer (Subcutaneous Tissue) exposed. There is no tunneling or undermining noted. There is a medium amount of serosanguineous drainage noted. The wound margin is well defined and not attached to the wound base. There is large (67-100%) red, pink granulation within the wound bed. There is a small (1-33%) amount of necrotic tissue within the wound bed including Adherent Slough. 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 No signs of infection on exam. I recommended continuing Santyl and wound VAC. She has been approved for a skin substitute. Her wound bed is not quite ready for a skin substitute. . She follows closely with Dr. Dellia Nims and will have follow-up next week. Procedures Wound #1 Pre-procedure diagnosis of Wound #1 is an Open Surgical Wound located on the Left Chest . There was a Chemical/Enzymatic/Mechanical debridement performed by Deon Pilling, RN.Marland Kitchen Agent used was [employer]. There was no bleeding. The procedure was tolerated well. Post Debridement Measurements: 2.1cm length x 2cm width x 0.5cm depth; 1.649cm^3 volume. Character of Wound/Ulcer Post Debridement is stable. Post procedure Diagnosis Wound #1: Same as Pre-Procedure Plan Follow-up Appointments: Return Appointment in 1 week. - Tuesday with Dr. Dellia Nims 10/22/2020 Nurse Visit: - Vac dressing change Friday 10/18/2020 Bathing/ Shower/ Hygiene: May shower with protection but do not get wound dressing(s) wet. Negative Presssure Wound Therapy: Wound Vac to wound continuously at 155m/hg pressure Black Foam - Apply Santyl to wound bed, under foam WOUND #1: - Chest Wound Laterality: Left Cleanser: Wound Cleanser 2 x Per Week/15 Days Discharge Instructions: Cleanse the wound with wound cleanser prior to applying a clean dressing using gauze sponges, not tissue or cotton balls. Prim Dressing: Santyl Ointment 2 x Per Week/15 Days ary Discharge Instructions: Apply nickel thick amount to wound bed under vac foam 1. Santyl and wound VAC 2. Follow-up in 1 week Electronic Signature(s) Signed: 10/15/2020 2:35:34 PM By: HKalman ShanDO Entered By: HKalman Shanon 10/15/2020 14:34:54 -------------------------------------------------------------------------------- HxROS Details Patient Name: Date of Service: APecola Khan [name] 10/15/2020 1:15 PM Medical Record Number: [id number] Account Number:  [id number] Date of  Birth/Sex: Treating RN: [birth year] (50 y.o. [name] Primary Care Provider: [name] Other Clinician: Referring Provider: Treating Provider/Extender: [name] in Treatment: 27 Information Obtained From Patient Constitutional Symptoms (General Health) Medical History: Past Medical History Notes: obesity Eyes Medical History: Negative for: Cataracts; Glaucoma; Optic Neuritis Ear/Nose/Mouth/Throat Medical History: Negative for: Chronic sinus problems/congestion; Middle ear problems Cardiovascular Medical History: Positive for: Hypertension - conditional Gastrointestinal Medical History: Past Medical History Notes: colon mass Endocrine Medical History: Negative for: Type I Diabetes; Type II Diabetes Genitourinary Medical History: Negative for: End Stage Renal Disease Integumentary (Skin) Medical History: Negative for: History of Burn Musculoskeletal Medical History: Positive for: Osteoarthritis Oncologic Medical History: Positive for: Received Chemotherapy - 2018; Received Radiation - 2019 Past Medical History Notes: breast CA with mets Psychiatric Medical History: Negative for: Anorexia/bulimia; Confinement Anxiety Immunizations Pneumococcal Vaccine: Received Pneumococcal Vaccination: No Implantable Devices Yes Hospitalization / Surgery History Type of Hospitalization/Surgery portacath insertion ORIF right ankle left breast lumpectomy debridement and closure of mastectomy wound endoscopic mucosal resection mastectomy with sentinel node biopsy tubal ligation wisdom tooth extraction Family and Social History Cancer: Yes - Father,Maternal Grandparents,Child; Diabetes: Yes - Mother,Father; Heart Disease: Yes - Father; Hypertension: Yes - Mother,Father; Kidney Disease: No; Lung Disease: No; Seizures: No; Stroke: No; Thyroid Problems: No; Tuberculosis: No; Never smoker; Marital Status - Married; Alcohol Use: Never;  Drug Use: No History; Caffeine Use: Rarely; Financial Concerns: No; Food, Clothing or Shelter Needs: No; Support System Lacking: No; Transportation Concerns: No Electronic Signature(s) Signed: 10/15/2020 2:35:34 PM By: Kalman Shan DO Signed: 10/15/2020 5:07:29 PM By: Deon Pilling RN, BSN Entered By: Kalman Shan on 10/15/2020 14:31:59 -------------------------------------------------------------------------------- SuperBill Details Patient Name: Date of Service: [name], [name] 10/15/2020 Medical Record Number: [id number] Patient Account Number: [id number] Date of Birth/Sex: Treating RN: 08-May-1970 (50 y.o. 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 Facility Procedures CPT4 Code: [id number] Description: 20355 - WOUND VAC-50 SQ CM OR LESS Modifier: Quantity: 1 Physician Procedures : CPT4 Code Description Modifier [id number] - WC PHYS LEVEL 3 - EST PT ICD-10 Diagnosis Description T81.31XD Disruption of external operation (surgical) wound, not elsewhere classified, subsequent encounter L59.8 Other specified disorders of the skin  and subcutaneous tissue 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

## 2020-10-16 NOTE — Progress Notes (Signed)
Sarah, Khan ([id number]) Visit Report for 10/15/2020 Arrival Information Details Patient Name: Date of Service: [name] [name] 10/15/2020 1:15 PM Medical Record Number: [id number] Patient Account Number: [id number] Date of Birth/Sex: Treating RN: [birth year] (50 y.o. [name], [name] Primary Care [name]: [name] Other Clinician: Referring [name]: Treating [name]/Extender: [name] in Treatment: 27 Visit Information History Since Last Visit Added or deleted any medications: No Patient Arrived: Ambulatory Any new allergies or adverse reactions: No Arrival Time: 13:50 Had Sarah fall or experienced change in No Accompanied By: self activities of daily living that may affect Transfer Assistance: None risk of falls: Patient Identification Verified: Yes Signs or symptoms of abuse/neglect since last visito No Secondary Verification Process Completed: Yes Hospitalized since last visit: No Patient Requires Transmission-Based Precautions: No Implantable device outside of the clinic excluding No Patient Has Alerts: No cellular tissue based products placed in the center since last visit: Has Dressing in Place as Prescribed: Yes Pain Present Now: No Electronic Signature(s) Signed: 10/16/2020 1:46:44 PM By: [name] Entered By: [name] on 10/15/2020 13:51:14 -------------------------------------------------------------------------------- Encounter Discharge Information Details Patient Name: Date of Service: [name] [name] 10/15/2020 1:15 PM Medical Record Number: [id number] Patient Account Number: [id number] Date of Birth/Sex: Treating RN: [birth year] (50 y.o. [name] Primary Care [name]: [name] Other Clinician: Referring Laquinton Bihm: Treating [name]/Extender: [name] in Treatment: 27 Encounter Discharge Information Items Post Procedure Vitals Discharge  Condition: Stable Temperature (F): 97.8 Ambulatory Status: Ambulatory Pulse (bpm): 106 Discharge Destination: Home Respiratory Rate (breaths/min): 20 Transportation: Private Auto Blood Pressure (mmHg): 167/85 Accompanied By: self Schedule Follow-up Appointment: Yes Clinical Summary of Care: Electronic Signature(s) Signed: 10/15/2020 5:07:29 PM By: Sarah Pilling RN, BSN Entered By: [name] on 10/15/2020 14:07:42 -------------------------------------------------------------------------------- Lower Extremity Assessment Details Patient Name: Date of Service: [name] [name] 10/15/2020 1:15 PM Medical Record Number: [id number] Patient Account Number: [id number] Date of Birth/Sex: Treating RN: [name] (50 y.o. [name] Primary Care Sharisse Rantz: [name] Other Clinician: Referring Kourtland Coopman: Treating Arionna Hoggard/Extender: [name] in Treatment: 27 Electronic Signature(s) Signed: 10/15/2020 5:07:29 PM By: Sarah Pilling RN, BSN Entered By: [name] on 10/15/2020 14:02:03 -------------------------------------------------------------------------------- Multi Wound Chart Details Patient Name: Date of Service: [name], [name] [name] 10/15/2020 1:15 PM Medical Record Number: [id number] Patient Account Number: [id number] Date of Birth/Sex: Treating RN: [birth year] (50 y.o. [name] Primary Care Jaelin Devincentis: [name] Other Clinician: Referring [name]: Treating Keya Wynes/Extender: [name] in Treatment: 27 Vital Signs Height(in): 67 Pulse(bpm): 106 Weight(lbs): 66 Blood Pressure(mmHg): 167/85 Body Mass Index(BMI): 40 Temperature(F): 97.8 Respiratory Rate(breaths/min): 16 Photos: [N/Sarah:N/Sarah] Left Chest N/Sarah N/Sarah Wound Location: Surgical Injury N/Sarah N/Sarah Wounding Event: Open Surgical Wound N/Sarah N/Sarah Primary Etiology: Necrosis (Radiation) N/Sarah N/Sarah Secondary  Etiology: Hypertension, Osteoarthritis, ReceivedN/Sarah N/Sarah Comorbid History: Chemotherapy, Received Radiation 01/16/2020 N/Sarah N/Sarah Date Acquired: 57 N/Sarah N/Sarah Weeks of Treatment: Open N/Sarah N/Sarah Wound Status: 2.1x2x0.5 N/Sarah N/Sarah Measurements Khan x W x D (cm) 3.299 N/Sarah N/Sarah Sarah (cm) : rea 1.649 N/Sarah N/Sarah Volume (cm) : 48.10% N/Sarah N/Sarah % Reduction in Area: 76.40% N/Sarah N/Sarah % Reduction in Volume: Full Thickness Without Exposed N/Sarah N/Sarah Classification: Support Structures Medium N/Sarah N/Sarah Exudate Amount: Serosanguineous N/Sarah N/Sarah Exudate Type: red, brown N/Sarah N/Sarah Exudate Color: Well defined, not attached N/Sarah N/Sarah Wound Margin: Large (67-100%) N/Sarah N/Sarah Granulation Amount: Red,  Pink N/Sarah N/Sarah Granulation Quality: Small (1-33%) N/Sarah N/Sarah Necrotic Amount: Fat Layer (Subcutaneous Tissue): Yes N/Sarah N/Sarah Exposed Structures: Fascia: No Tendon: No Muscle: No Joint: No Bone: No Small (1-33%) N/Sarah N/Sarah Epithelialization: Chemical/Enzymatic/Mechanical N/Sarah N/Sarah Debridement: N/Sarah N/Sarah N/Sarah Instrument: None N/Sarah N/Sarah Bleeding: Debridement Treatment Response: Procedure was tolerated well N/Sarah N/Sarah Post Debridement Measurements Khan x 2.1x2x0.5 N/Sarah N/Sarah W x D (cm) 1.649 N/Sarah N/Sarah Post Debridement Volume: (cm) Debridement N/Sarah N/Sarah Procedures Performed: Negative Pressure Wound Therapy Maintenance (NPWT) Treatment Notes Wound #1 (Chest) Wound Laterality: Left Cleanser Wound Cleanser Discharge Instruction: Cleanse the wound with wound cleanser prior to applying Sarah clean dressing using gauze sponges, not tissue or cotton balls. Peri-Wound Care Topical Primary Dressing Santyl Ointment Discharge Instruction: Apply nickel thick amount to wound bed under vac foam Secondary Dressing Secured With Compression Wrap Compression Stockings Add-Ons Electronic Signature(s) Signed: 10/15/2020 2:35:34 PM By: Kalman Shan DO Signed: 10/15/2020 5:07:29 PM By: Sarah Pilling RN, BSN Entered By: Kalman Shan on 10/15/2020  14:31:07 -------------------------------------------------------------------------------- [place] Details Patient Name: Date of Service: [name], Sarah Chafe Khan [name] 10/15/2020 1:15 PM Medical Record Number: [id number] Patient Account Number: [id number] Date of Birth/Sex: Treating RN: [birth year] (50 y.o. [name], [name] Primary Care Takeila Thayne: [name] Other Clinician: Referring Tanzie Rothschild: Treating Takyla Kuchera/Extender: [name] in Treatment: 27 Active Inactive Wound/Skin Impairment Nursing Diagnoses: Impaired tissue integrity Knowledge deficit related to smoking impact on wound healing Knowledge deficit related to ulceration/compromised skin integrity Goals: Patient will demonstrate Sarah reduced rate of smoking or cessation of smoking Date Initiated: 04/09/2020 Date Inactivated: 06/18/2020 Target Resolution Date: 06/28/2020 Goal Status: Met Patient will have Sarah decrease in wound volume by X% from date: (specify in notes) Date Initiated: 04/09/2020 Date Inactivated: 07/03/2020 Target Resolution Date: 07/05/2020 Goal Status: Met Patient/caregiver will verbalize understanding of skin care regimen Date Initiated: 04/09/2020 Target Resolution Date: 11/28/2020 Goal Status: Active Ulcer/skin breakdown will have Sarah volume reduction of 30% by week 4 Date Initiated: 04/09/2020 Date Inactivated: 05/14/2020 Target Resolution Date: 05/11/2020 Unmet Reason: see wound Goal Status: Unmet meaurements. Interventions: Assess patient/caregiver ability to obtain necessary supplies Assess patient/caregiver ability to perform ulcer/skin care regimen upon admission and as needed Assess ulceration(s) every visit Provide education on smoking Notes: Electronic Signature(s) Signed: 10/15/2020 5:07:29 PM By: Sarah Pilling RN, BSN Entered By: [name] on 10/15/2020  14:06:20 -------------------------------------------------------------------------------- Negative Pressure Wound Therapy Maintenance (NPWT) Details Patient Name: Date of Service: Sarah BDELA Shayne Alken Khan [name] 10/15/2020 1:15 PM Medical Record Number: [id number] Patient Account Number: [id number] Date of Birth/Sex: Treating RN: [birth year] (50 y.o. [name] Primary Care Terrance Usery: [name] Other Clinician: Referring Georgeanna Radziewicz: Treating Jovana Rembold/Extender: [name] in Treatment: 27 NPWT Maintenance Performed for: Wound #1 Left Chest Performed By: Sarah Pilling, RN Type: VAC System Coverage Size (sq cm): 4.2 Pressure Type: Constant Pressure Setting: 125 mmHG Drain Type: None Primary Contact: Non-Adherent Sponge/Dressing Type: Foam, Black Date Initiated: 08/21/2020 Dressing Removed: Yes Quantity of Sponges/Gauze Removed: patient removed x1 black foam. Canister Changed: No Canister Exudate Volume: 25 Dressing Reapplied: Yes Quantity of Sponges/Gauze Inserted: x1 black foam Respones T Treatment: o tolerated well. Days On NPWT : 70 Post Procedure Diagnosis Same as Pre-procedure Notes santyl under foam. Electronic Signature(s) Signed: 10/15/2020 5:07:29 PM By: Sarah Pilling RN, BSN Entered By: [name] on 10/15/2020 14:04:49 -------------------------------------------------------------------------------- Pain Assessment Details Patient Name: Date of Service: [name], [name] [name] 10/15/2020 1:15 PM  Medical Record Number: [id number] Patient Account Number: [id number] Date of Birth/Sex: Treating RN: [birth year] Khan (50 y.o. [name] Primary Care Faria Casella: [name] Other Clinician: Referring Macall Mccroskey: Treating Brandy Kabat/Extender: [name] in Treatment: 27 Active Problems Location of Pain Severity and Description of Pain Patient Has Paino Yes Site Locations Rate the pain. Current Pain  Level: 3 Pain Management and Medication Current Pain Management: Electronic Signature(s) Signed: 10/15/2020 5:07:29 PM By: Sarah Pilling RN, BSN Signed: 10/16/2020 1:46:44 PM By: [name] Entered By: [name] on 10/15/2020 13:51:56 -------------------------------------------------------------------------------- Patient/Caregiver Education Details Patient Name: Date of Service: Sarah [name] 10/11/2022andnbsp1:15 PM Medical Record Number: [id number] Patient Account Number: [id number] Date of Birth/Gender: Treating RN: [birth year] Khan (50 y.o. [name] Primary Care Physician: [name] Other Clinician: Referring Physician: Treating Physician/Extender: [name] in Treatment: 27 Education Assessment Education Provided To: Patient Education Topics Provided Wound/Skin Impairment: Handouts: Skin Care Do's and Dont's Methods: Explain/Verbal Responses: Reinforcements needed Electronic Signature(s) Signed: 10/15/2020 5:07:29 PM By: Sarah Pilling RN, BSN Entered By: [name] on 10/15/2020 14:06:36 -------------------------------------------------------------------------------- Wound Assessment Details Patient Name: Date of Service: [name] [name] 10/15/2020 1:15 PM Medical Record Number: [id number] Patient Account Number: [id number] Date of Birth/Sex: Treating RN: [birth year] (50 y.o. [name], [name] Primary Care Mekel Haverstock: [name] Other Clinician: Referring Tiwatope Emmitt: Treating Enma Maeda/Extender: [name] in Treatment: 27 Wound Status Wound Number: 1 Primary Open Surgical Wound Etiology: Wound Location: Left Chest Secondary Necrosis (Radiation) Wounding Event: Surgical Injury Etiology: Date Acquired: 01/16/2020 Wound Status: Open Weeks Of Treatment: 27 Comorbid Hypertension, Osteoarthritis, Received Chemotherapy, Clustered Wound: No History: Received  Radiation Photos Wound Measurements Length: (cm) 2.1 Width: (cm) 2 Depth: (cm) 0.5 Area: (cm) 3.299 Volume: (cm) 1.649 % Reduction in Area: 48.1% % Reduction in Volume: 76.4% Epithelialization: Small (1-33%) Tunneling: No Undermining: No Wound Description Classification: Full Thickness Without Exposed Support Structures Wound Margin: Well defined, not attached Exudate Amount: Medium Exudate Type: Serosanguineous Exudate Color: red, brown Foul Odor After Cleansing: No Slough/Fibrino Yes Wound Bed Granulation Amount: Large (67-100%) Exposed Structure Granulation Quality: Red, Pink Fascia Exposed: No Necrotic Amount: Small (1-33%) Fat Layer (Subcutaneous Tissue) Exposed: Yes Necrotic Quality: Adherent Slough Tendon Exposed: No Muscle Exposed: No Joint Exposed: No Bone Exposed: No Treatment Notes Wound #1 (Chest) Wound Laterality: Left Cleanser Wound Cleanser Discharge Instruction: Cleanse the wound with wound cleanser prior to applying Sarah clean dressing using gauze sponges, not tissue or cotton balls. Peri-Wound Care Topical Primary Dressing Santyl Ointment Discharge Instruction: Apply nickel thick amount to wound bed under vac foam Secondary Dressing Secured With Compression Wrap Compression Stockings Add-Ons Electronic Signature(s) Signed: 10/15/2020 5:07:29 PM By: Sarah Pilling RN, BSN Entered By: [name] on 10/15/2020 14:01:54 -------------------------------------------------------------------------------- Vitals Details Patient Name: Date of Service: [name], [name] [name] 10/15/2020 1:15 PM Medical Record Number: [id number] Patient Account Number: [id number] Date of Birth/Sex: Treating RN: [birth year] (50 y.o. [name], [name] Primary Care Keta [place]: [name] Other Clinician: Referring Caralyn Twining: Treating Aleli Navedo/Extender: [name] in Treatment: 27 Vital Signs Time Taken: 13:51 Temperature (F):  97.8 Height (in): 67 Pulse (bpm): 106 Weight (lbs): 257 Respiratory Rate (breaths/min): 16 Body Mass Index (BMI): 40.2 Blood Pressure (mmHg): 167/85 Reference Range: 80 - 120 mg / dl Electronic Signature(s) Signed: 10/16/2020 1:46:44 PM By: [name] Entered By: [name] on 10/15/2020 13:51:37

## 2020-10-18 ENCOUNTER — Encounter (HOSPITAL_BASED_OUTPATIENT_CLINIC_OR_DEPARTMENT_OTHER): Payer: Medicaid Other | Admitting: Internal Medicine

## 2020-10-19 MED FILL — Potassium Chloride Microencapsulated Crys ER Tab 20 mEq: ORAL | 30 days supply | Qty: 30 | Fill #1 | Status: CN

## 2020-10-21 ENCOUNTER — Other Ambulatory Visit (HOSPITAL_COMMUNITY): Payer: Self-pay

## 2020-10-21 ENCOUNTER — Encounter (HOSPITAL_BASED_OUTPATIENT_CLINIC_OR_DEPARTMENT_OTHER): Payer: Medicaid Other | Admitting: Internal Medicine

## 2020-10-21 ENCOUNTER — Other Ambulatory Visit: Payer: Self-pay

## 2020-10-21 DIAGNOSIS — T8131XA Disruption of external operation (surgical) wound, not elsewhere classified, initial encounter: Secondary | ICD-10-CM | POA: Diagnosis not present

## 2020-10-21 NOTE — Progress Notes (Signed)
BRANDIE, LOPES ([id number]) Visit Report for 10/21/2020 Arrival Information Details Patient Name: Date of Service: [name] 10/21/2020 9:45 A M Medical Record Number: [id number] Patient Account Number: [id number] Date of Birth/Sex: Treating RN: 05/06/1970 (51 y.o. [name] Primary Care Preston Garabedian: [name] Other Clinician: Referring Ripley Bogosian: Treating Mychal Durio/Extender: [name] in Treatment: 27 Visit Information History Since Last Visit Added or deleted any medications: No Patient Arrived: Ambulatory Any new allergies or adverse reactions: No Arrival Time: 10:05 Had a fall or experienced change in No Transfer Assistance: None activities of daily living that may affect Patient Identification Verified: Yes risk of falls: Secondary Verification Process Completed: Yes Signs or symptoms of abuse/neglect since last visito No Patient Requires Transmission-Based Precautions: No Hospitalized since last visit: No Patient Has Alerts: No Implantable device outside of the clinic excluding No cellular tissue based products placed in the center since last visit: Has Dressing in Place as Prescribed: Yes Pain Present Now: No Electronic Signature(s) Signed: 10/21/2020 5:53:01 PM By: Lorrin Jackson Entered By: Lorrin Jackson on 10/21/2020 10:05:37 -------------------------------------------------------------------------------- Encounter Discharge Information Details Patient Name: Date of Service: [name] 10/21/2020 9:45 A M Medical Record Number: [id number] Patient Account Number: [id number] Date of Birth/Sex: Treating RN: Aug 06, 1970 (50 y.o. [name] Primary Care Kadeisha Betsch: [name] Other Clinician: Referring Jerin Franzel: Treating Annamae Shivley/Extender: [name] in Treatment: 27 Encounter Discharge Information Items Post Procedure Vitals Discharge Condition:  Stable Temperature (F): 98.4 Ambulatory Status: Ambulatory Pulse (bpm): 101 Discharge Destination: Home Respiratory Rate (breaths/min): 16 Transportation: Private Auto Blood Pressure (mmHg): 121/81 Schedule Follow-up Appointment: Yes Clinical Summary of Care: Provided on 10/21/2020 Form Type Recipient Paper Patient Patient Electronic Signature(s) Signed: 10/21/2020 5:53:01 PM By: Lorrin Jackson Entered By: Lorrin Jackson on 10/21/2020 11:06:54 -------------------------------------------------------------------------------- Lower Extremity Assessment Details Patient Name: Date of Service: [name] 10/21/2020 9:45 A M Medical Record Number: [id number] Patient Account Number: [id number] Date of Birth/Sex: Treating RN: 1970-02-14 (49 y.o. [name] Primary Care Taraneh Metheney: [name] Other Clinician: Referring Kennth Vanbenschoten: Treating Dareon Nunziato/Extender: [name] in Treatment: 27 Electronic Signature(s) Signed: 10/21/2020 5:53:01 PM By: Lorrin Jackson Entered By: Lorrin Jackson on 10/21/2020 10:09:16 -------------------------------------------------------------------------------- Multi Wound Chart Details Patient Name: Date of Service: [name] 10/21/2020 9:45 A M Medical Record Number: [id number] Patient Account Number: [id number] Date of Birth/Sex: Treating RN: 07-11-1970 (50 y.o. [name] Primary Care Amaria Mundorf: [name] Other Clinician: Referring Cullen Vanallen: Treating Danea Manter/Extender: [name] in Treatment: 27 Vital Signs Height(in): 74 Pulse(bpm): 101 Weight(lbs): 709 Blood Pressure(mmHg): 121/81 Body Mass Index(BMI): 40 Temperature(F): 98.4 Respiratory Rate(breaths/min): 16 Photos: [N/A:N/A] Left Chest N/A N/A Wound Location: Surgical Injury N/A N/A Wounding Event: Open Surgical Wound N/A N/A Primary Etiology: Necrosis (Radiation) N/A  N/A Secondary Etiology: Hypertension, Osteoarthritis, ReceivedN/A N/A Comorbid History: Chemotherapy, Received Radiation 01/16/2020 N/A N/A Date Acquired: 25 N/A N/A Weeks of Treatment: Open N/A N/A Wound Status: 1.8x2x0.4 N/A N/A Measurements L x W x D (cm) 2.827 N/A N/A A (cm) : rea 1.131 N/A N/A Volume (cm) : 55.60% N/A N/A % Reduction in Area: 83.80% N/A N/A % Reduction in Volume: Full Thickness Without Exposed N/A N/A Classification: Support Structures Medium N/A N/A Exudate Amount: Serosanguineous N/A N/A Exudate Type: red, brown N/A N/A Exudate Color: Well defined, not attached N/A N/A Wound Margin: Large (67-100%) N/A N/A  Granulation Amount: Red, Pink N/A N/A Granulation Quality: None Present (0%) N/A N/A Necrotic Amount: Fat Layer (Subcutaneous Tissue): Yes N/A N/A Exposed Structures: Fascia: No Tendon: No Muscle: No Joint: No Bone: No Medium (34-66%) N/A N/A Epithelialization: Debridement - Excisional N/A N/A Debridement: Pre-procedure Verification/Time Out 10:39 N/A N/A Taken: Other N/A N/A Pain Control: Subcutaneous N/A N/A Tissue Debrided: Skin/Subcutaneous Tissue N/A N/A Level: 3.6 N/A N/A Debridement A (sq cm): rea Curette N/A N/A Instrument: Minimum N/A N/A Bleeding: Pressure N/A N/A Hemostasis A chieved: Procedure was tolerated well N/A N/A Debridement Treatment Response: 1.8x2x0.4 N/A N/A Post Debridement Measurements L x W x D (cm) 1.131 N/A N/A Post Debridement Volume: (cm) Debridement N/A N/A Procedures Performed: Negative Pressure Wound Therapy Maintenance (NPWT) Treatment Notes Wound #1 (Chest) Wound Laterality: Left Cleanser Wound Cleanser Discharge Instruction: Cleanse the wound with wound cleanser prior to applying a clean dressing using gauze sponges, not tissue or cotton balls. Peri-Wound Care Topical Primary Dressing Santyl Ointment Discharge Instruction: Apply nickel thick amount to wound bed under  vac foam Secondary Dressing Secured With Compression Wrap Compression Stockings Add-Ons Electronic Signature(s) Signed: 10/21/2020 5:32:36 PM By: [name] MD Signed: 10/21/2020 5:53:01 PM By: Lorrin Jackson Entered By: [name] on 10/21/2020 12:18:51 -------------------------------------------------------------------------------- Multi-Disciplinary Care Plan Details Patient Name: Date of Service: [name] 10/21/2020 9:45 A M Medical Record Number: [id number] Patient Account Number: [id number] Date of Birth/Sex: Treating RN: 13-May-1970 (50 y.o. [name] Primary Care Magalene Mclear: [name] Other Clinician: Referring Alicha Raspberry: Treating Kikuye Korenek/Extender: [name] in Treatment: 27 Active Inactive Wound/Skin Impairment Nursing Diagnoses: Impaired tissue integrity Knowledge deficit related to smoking impact on wound healing Knowledge deficit related to ulceration/compromised skin integrity Goals: Patient will demonstrate a reduced rate of smoking or cessation of smoking Date Initiated: 04/09/2020 Date Inactivated: 06/18/2020 Target Resolution Date: 06/28/2020 Goal Status: Met Patient will have a decrease in wound volume by X% from date: (specify in notes) Date Initiated: 04/09/2020 Date Inactivated: 07/03/2020 Target Resolution Date: 07/05/2020 Goal Status: Met Patient/caregiver will verbalize understanding of skin care regimen Date Initiated: 04/09/2020 Target Resolution Date: 11/28/2020 Goal Status: Active Ulcer/skin breakdown will have a volume reduction of 30% by week 4 Date Initiated: 04/09/2020 Date Inactivated: 05/14/2020 Target Resolution Date: 05/11/2020 Unmet Reason: see wound Goal Status: Unmet meaurements. Interventions: Assess patient/caregiver ability to obtain necessary supplies Assess patient/caregiver ability to perform ulcer/skin care regimen upon admission and as needed Assess ulceration(s)  every visit Provide education on smoking Notes: Electronic Signature(s) Signed: 10/21/2020 5:53:01 PM By: Lorrin Jackson Entered By: Lorrin Jackson on 10/21/2020 10:04:36 -------------------------------------------------------------------------------- Negative Pressure Wound Therapy Maintenance (NPWT) Details Patient Name: Date of Service: [name] 10/21/2020 9:45 A M Medical Record Number: [id number] Patient Account Number: [id number] Date of Birth/Sex: Treating RN: [birth year] (50 y.o. [name] Primary Care Gitty Osterlund: [name] Other Clinician: Referring Ciarah Peace: Treating Lemon Sternberg/Extender: [name] in Treatment: 27 NPWT Maintenance Performed for: Wound #1 Left Chest Performed By: Lorrin Jackson, RN Type: VAC System Coverage Size (sq cm): 3.6 Pressure Type: Constant Pressure Setting: 125 mmHG Drain Type: None Primary Contact: Other : Santyl Sponge/Dressing Type: Foam, Black Date Initiated: 08/21/2020 Dressing Removed: No Canister Changed: No Dressing Reapplied: Yes Days On NPWT : 62 Post Procedure Diagnosis Same as Pre-procedure Electronic Signature(s) Signed: 10/21/2020 5:53:01 PM By: Lorrin Jackson Entered By: Lorrin Jackson on 10/21/2020 10:44:12 -------------------------------------------------------------------------------- Pain Assessment Details Patient Name: Date of Service: A  [place] A L, [name] 10/21/2020 9:45 A M Medical Record Number: [id number] Patient Account Number: [id number] Date of Birth/Sex: Treating RN: [birth year] (50 y.o. [name] Primary Care Broderick Fonseca: [name] Other Clinician: Referring Misaki Sozio: Treating Sahmir Weatherbee/Extender: [name] in Treatment: 27 Active Problems Location of Pain Severity and Description of Pain Patient Has Paino No Site Locations Pain Management and Medication Current Pain Management: Electronic  Signature(s) Signed: 10/21/2020 5:53:01 PM By: Lorrin Jackson Entered By: Lorrin Jackson on 10/21/2020 10:09:07 -------------------------------------------------------------------------------- Patient/Caregiver Education Details Patient Name: Date of Service: [name] 10/17/2022andnbsp9:45 A M Medical Record Number: [id number] Patient Account Number: [id number] Date of Birth/Gender: Treating RN: [birth year] (50 y.o. [name] Primary Care Physician: [name] Other Clinician: Referring Physician: Treating Physician/Extender: [name] in Treatment: 27 Education Assessment Education Provided To: Patient Education Topics Provided Wound/Skin Impairment: Methods: Demonstration, Explain/Verbal, Printed Responses: State content correctly Electronic Signature(s) Signed: 10/21/2020 5:53:01 PM By: Lorrin Jackson Entered By: Lorrin Jackson on 10/21/2020 10:05:08 -------------------------------------------------------------------------------- Wound Assessment Details Patient Name: Date of Service: [name] 10/21/2020 9:45 A M Medical Record Number: [id number] Patient Account Number: [id number] Date of Birth/Sex: Treating RN: 07-22-1970 (50 y.o. [name] Primary Care Shilee Biggs: [name] Other Clinician: Referring Velena Keegan: Treating Anushree Dorsi/Extender: [name] in Treatment: 27 Wound Status Wound Number: 1 Primary Open Surgical Wound Etiology: Wound Location: Left Chest Secondary Necrosis (Radiation) Wounding Event: Surgical Injury Etiology: Date Acquired: 01/16/2020 Wound Status: Open Weeks Of Treatment: 27 Comorbid Hypertension, Osteoarthritis, Received Chemotherapy, Clustered Wound: No History: Received Radiation Photos Wound Measurements Length: (cm) 1.8 Width: (cm) 2 Depth: (cm) 0.4 Area: (cm) 2.827 Volume: (cm) 1.131 % Reduction in Area: 55.6% %  Reduction in Volume: 83.8% Epithelialization: Medium (34-66%) Tunneling: No Undermining: No Wound Description Classification: Full Thickness Without Exposed Support Structures Wound Margin: Well defined, not attached Exudate Amount: Medium Exudate Type: Serosanguineous Exudate Color: red, brown Foul Odor After Cleansing: No Slough/Fibrino No Wound Bed Granulation Amount: Large (67-100%) Exposed Structure Granulation Quality: Red, Pink Fascia Exposed: No Necrotic Amount: None Present (0%) Fat Layer (Subcutaneous Tissue) Exposed: Yes Tendon Exposed: No Muscle Exposed: No Joint Exposed: No Bone Exposed: No Treatment Notes Wound #1 (Chest) Wound Laterality: Left Cleanser Wound Cleanser Discharge Instruction: Cleanse the wound with wound cleanser prior to applying a clean dressing using gauze sponges, not tissue or cotton balls. Peri-Wound Care Topical Primary Dressing Santyl Ointment Discharge Instruction: Apply nickel thick amount to wound bed under vac foam Secondary Dressing Secured With Compression Wrap Compression Stockings Add-Ons Electronic Signature(s) Signed: 10/21/2020 5:48:05 PM By: Baruch Gouty RN, BSN Entered By: Baruch Gouty on 10/21/2020 10:12:42 -------------------------------------------------------------------------------- [place] Details Patient Name: Date of Service: [name] 10/21/2020 9:45 A M Medical Record Number: [id number] Patient Account Number: [id number] Date of Birth/Sex: Treating RN: 1970/09/22 (50 y.o. [name] Primary Care Isidore Margraf: [name] Other Clinician: Referring Reniya Mcclees: Treating Braxtyn Dorff/Extender: [name] in Treatment: 27 Vital Signs Time Taken: 10:08 Temperature (F): 98.4 Height (in): 67 Pulse (bpm): 101 Weight (lbs): 257 Respiratory Rate (breaths/min): 16 Body Mass Index (BMI): 40.2 Blood Pressure (mmHg): 121/81 Reference Range: 80 - 120 mg /  dl Electronic Signature(s) Signed: 10/21/2020 5:53:01 PM By: Lorrin Jackson Entered By: Lorrin Jackson on 10/21/2020 10:08:55

## 2020-10-21 NOTE — Progress Notes (Signed)
Sarah, Khan ([id number]) Visit Report for 10/21/2020 Debridement Details Patient Name: Date of Service: [name] [name] Medical Record Number: [id number] Patient Account Number: [id number] Date of Birth/Sex: Treating RN: [birth year] (50 y.o. [name] Primary Care Provider: Karle Khan Other Clinician: Referring Provider: Treating Provider/Extender: [name] in Treatment: 27 Debridement Performed for Assessment: Wound #1 Left Chest Performed By: Physician Sarah Dillon., MD Debridement Type: Debridement Level of Consciousness (Pre-procedure): Awake and Alert Pre-procedure Verification/Time Out Yes - 10:39 Taken: Start Time: 10:40 Pain Control: Other : Benzocaine T Area Debrided (L x W): otal 1.8 (cm) x 2 (cm) = 3.6 (cm) Tissue and other material debrided: Non-Viable, Slough, Subcutaneous, Slough Level: Skin/Subcutaneous Tissue Debridement Description: Excisional Instrument: Curette Bleeding: Minimum Hemostasis Achieved: Pressure End Time: 10:42 Response to Treatment: Procedure was tolerated well Level of Consciousness (Post- Awake and Alert procedure): Post Debridement Measurements of Total Wound Length: (cm) 1.8 Width: (cm) 2 Depth: (cm) 0.4 Volume: (cm) 1.131 Character of Wound/Ulcer Post Debridement: Stable Post Procedure Diagnosis Same as Pre-procedure Electronic Signature(s) Signed: 10/21/2020 5:32:36 PM By: Sarah Ham MD Signed: 10/21/2020 5:53:01 PM By: [name] Entered By: [name] on 10/21/2020 12:19:14 -------------------------------------------------------------------------------- HPI Details Patient Name: Date of Service: [name] [name] Medical Record Number: [id number] Patient Account Number: [id number] Date of Birth/Sex: Treating RN: [birth year] (50 y.o. [name] Primary Care Provider: Karle Khan Other  Clinician: Referring Provider: Treating Provider/Extender: [name] in Treatment: 27 History of Present Illness HPI Description: After canADMISSION 04/09/2020 [name] 50 year old woman who is history of breast cancer in the left breast goes back to 2018. He was found to have Sarah mass in her left breast. [name] and then subsequent chemo that she did not tolerate very well. She had seed implants placed but then underwent external beam Sarah radiation in the spring 2019. 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 Her adjunctive radiation was from 03/11/2017 through 04/21/2017. Unfortunately she had Sarah left breast biopsy on 11/24/2019 and it was again positive for an invasive ductal carcinoma grade 3 he is to resume at receptor 80% positive. HER-2 negative progesterone negative. She opted for bilateral mastectomies which were done on 01/09/2020 on the right with no evidence of malignancy on the left stage IIb invasive ductal carcinoma. She again received chemotherapy but she did not tolerate it because of wound dehiscence and low platelet counts according to the patient. Unfortunately her mastectomy wound dehisced on the left breast. She was taken back to the OR by Dr. Donne Khan of general surgery on 33/24/22 and she underwent Sarah debridement and subsequent attempt at closure. She had been previously treated with Augmentin for suspected underlying infection. Once again the wound has dehisced in the mid aspect. It is roughly 1 cm deep there is tunnels both medially and laterally she. She still has sutures in place. Still describes constant pain drainage. She also has Sarah drain still in place that drains about 10 cc/day. Currently she is changing ABD pad several times Sarah day 4/12; patient went back to see Dr. Donne Khan who  remove most of the stitches and debrided her wound. She has been using silver alginate culture I did last time which was Sarah PCR culture  showed Peptostreptococcus, Enterococcus faecalis and Staphylococcus epidermidis which is of questionable significance. [name] that the sutures have been removed. There are undermining areas superior medial superior lateral and Sarah deep tunneling area in the inferior part of the wound. There is no obvious purulence. I started her on Augmentin on 04/12/2020 and I will likely extend this for another week AMAZINGLY; she is already been approved for HBO and will start today. [name] Echocardiogram which I reviewed from 2019 showed Sarah normal ejection fraction.. I went over side effects of HBO with the patient and answered any of her questions. Her husband was present 5/3; [name]. She went to see Dr. Donne Khan about 2-1/2 weeks ago according to the patient he can remove some of the surrounding debris. She will see him again later this week or early next week. She has been using silver alginate. Sarah lot of this is problematic since she has Medicaid which traditionally does not cover wound care supplies although it traditionally does not cover HBO either Her wound actually looks quite Sarah bit better than the last time I saw this better looking surface healthy looking tissue. She has tunnels at 810 2 in 6 the deepest of these is at 2 at 2.2 cm there is no exposed bone. 5/10; the patient was coming in for nurse visit today but requested that I see the wound because of surface discoloration. She states the Iodoflex makes the surface of this turned "white". She is not really complaining of undue tenderness. She is changing this every second day. The goal of this had been to get the surface of the wound to Sarah vibrant red color and then potentially look at changing to Sarah silver collagen-based dressing, Puraply, and  then perhaps an alternative skin substituteo Derma vest. The patient tells me she has an appoint with her oncologist Dr. Jana Khan on the 19th. She apparently think she is going to get another PET scan. Based on this I would like his comment about whether there is possibility of any malignancy in the wound itself or just underneath it. Up to now we have not really thought that was true 5/17; nice improvement in the chest wound. Less depth. We have been using Iodoflex to clean off the wound surface this seems to have done nicely. Patient went to see Dr. Donne Khan 2 days ago who has referred her to see Dr. Claudia Desanctis. I certainly agree with the thought the surface of the wound looks Sarah lot better now and I think consideration of plastic surgery is certainly something the patient should discuss. I have talked to the patient about this today and I certainly support Sarah conversation with Dr. Claudia Desanctis. I also wondered about is there any residual cancer in this. Dr. Donne Khan apparently did not seem to think so per the patient although I do not see that in the text conversations we have had. I also wonder about Sarah skin substitute if her version of Medicare [[employer]] would cover this. Dr. Jana Khan is apparently considering starting her on [place]. Patient is already very scared about the side effects but she will discuss this with Dr. Jana Khan on 5/19 5/31; patient's wound about the same in terms of size she still has the tunneling area to the left. Surface of the wound looks Sarah lot better there has been some filling in the granulation I have been included on serial text between Dr. Callie Fielding surgery]  Dr. Ruffin Frederick oncology] and Dr. Para Skeans surgery]. I think there is not Sarah lot of thought that the patient has recurrence of her cancer her MRI was indeterminant but Dr. Jana Khan did not feel that there was any reason to think she has Sarah major recurrence and he was not in favor of doing Sarah dedicated breast  MRI. Dr. Claudia Desanctis wants to go ahead with Sarah flap closure I did it. I have not reviewed his note but per the patient [name] latissimus dorsi myocutaneous flap or I think Sarah skin flap from her abdomen. I have talked to the patient I think it is time to attempt this. If I can continue hyperbarics surrounding the flap then we will attempt to do this 6/14; patient using Prisma as the primary dressing here. She has completed hyperbarics and did very well. Her wound is responded nicely. The depth of this and the condition of the granulation tissue looks Sarah lot better. She has undermining areas at 2 and 10:00 both of these seem to be better as well. She is scheduled for flap closure on 7/11 by Dr. Claudia Desanctis 6/29; the patient continues to use Prisma wet-to-dry. Her graft is scheduled for 7/11 by Dr. Claudia Desanctis. We are looking at getting her additional hyperbaric treatment which I think might help with the likelihood of graft survival in this difficult irradiated area 7/21; patient returns to clinic to reinitiate HBO after her surgical procedure by Dr. Claudia Desanctis. On 07/15/2020 she had debridement of left chest wound and Sarah rotational flap closure. She also had excision of right excess axillary tissue. Pathology did not show any malignancy and follow-up Dr. Claudia Desanctis saw the patient yesterday. He noted Sarah 2 cm area of wound dehiscence. Recommended she return for hyperbaric oxygen and she returns today for her first treatment. Follow-up with him in 2 weeks. We were not actually going to see the patient today her appointment to be evaluated was Monday however she was concerned about Sarah "fishy odor". She is also noted some drainage. Not specifically dressing in the wound area but covering with Adaptic 7/25; I am seeing this wound and follow-up. So far her culture is polymicrobial not growing any specific anything specific nevertheless with the Augmentin and the silver alginate her odor is Sarah lot better. Not as much periwound tenderness.  Dimensions measuring 2.7 x 3.3 x 1.6. She has undermining from 10-1 o'clock of about 1 and half centimeters. Necrotic fat on the surface of this... 08/13/2020 upon evaluation today patient appears to be doing decently well in regard to her wound all things considered. Obviously this is much more open than what was hoped for based on the fact that she had Sarah flap performed which was supposed to completely close this wound. Nonetheless currently she tells me that she is not really having any significant pain fortunately the drainage is improved compared to last time there is still some evidence of odor as far as the drainage is concerned but is definitely not as purulent as what was previously noted. With that being said I do believe that there are quite Sarah few sutures which are quite embedded. Nonetheless I believe this needs to be removed. In fact if they are not I am afraid this is again actually grow into the skin and that is can to be even bigger issue. Some of them even so at this point appeared that there could be difficult to even get out. 8/17; this is the first time I am seeing  this in about 3 weeks. The patient had stop by my office yesterday to discuss after completing hyperbaric therapy. Based on what she said I was expecting the worst today however apparently with dressing changes and perhaps debridement last week the surface of the wound actually looks better than what I was expecting. There is considerable depth although the last time I saw this the base of the wound was necrotic there is still some necrotic tissue on the surface of the wound bed however in general the rest the tissue looks healthy even with some bleeding. She has been using cleaning the wound with Dakin's and then applying Prisma and then backing Dakin's wet-to-dry. This seems to work to clean the wound surface up. They also ordered Sarah wound VAC and I discussed this with her in depth today She is not systemically unwell. She  is not running Sarah fever there is no odor. She talks about pain over the lower part of the flap to touch. This does not appear to be related to movement of her shoulder. 8/23; the patient has been using the wound VAC for 1 week. Already Sarah remarkable improvement and she appears to be tolerating this well we had Santyl under the wound VAC however the surface looks so clean today I am doubtful that is going to be necessary She is also being treated with HBO. This is helped her dramatically. Apparently her current certification will be up on 8/31. She is only had 19 dives and we will certainly need to recertify her up for this. Since I saw her last week I put in Sarah secure text message to Dr. Claudia Desanctis through epic to let him know that we were going ahead with Sarah wound VAC. He did not have any objections to this. He is offering her more surgery however in the eventuality that this does not close 8/30; the patient missed 2 treatments of HBO last week because of nausea. I think she is now on Compazine which seems to have helped. Otherwise she is tolerating HBO well. We are collagen under wound VAC. Direct depth of wound measured at 1.2 cm which I think is probably unchanged however the undermining area is down to 0.5 which I think is an improvement 9/6; patient continues with chemotherapy although she says she is troll of her nausea. We are using collagen with Sarah wound VAC on the remaining wound on her chest. No major improvements in the undermining area from 12-5 currently measuring 0.5 cm. This is no better than last week 9/13; patient continuing with chemotherapy. She completes HBO today. We are using silver collagen under wound VAC. No major improvements however the undermining area seems somewhat better to me. 9/20; we are using silver collagen under the wound VAC and although about 60% of the wound looks healthy. At the base of the wound there is still Sarah very adherent slough. She has been approved for  TheraSkin which I would like to use if we can get the wound bed in Sarah cleaner situation. 9/27; we are using Santyl under the wound VAC. Dimensions are slightly better including depth of 0.7 cm. Slight improvement in length and width the undermining area currently at 12-2 done in itself and improvement measures 0.6 which is not too much of improvement in terms of the probable depth 10/4; using Santyl under the wound VAC. Comes in today with Sarah fairly dramatic improvement in surface area and marked improvement in the undermining area as well as condition of the  wound bed. This is gratifying to see in view of this were going to continue the Santyl under the [facility] for another week. 10/11; patient presents for follow-up. She reports continued wound healing with Santyl and wound VAC. She has no issues or complaints today. She denies signs of infection. 10/17; although the wound appears to have contracted the measurements are not that much different and more problematically not much improvement in the depth and condition of the wound bed. She still has Sarah shallow undermining area perhaps it 0.3 that its been somewhat improved. She is using using Santyl under the wound VAC in preparation for Sarah trial of Scientist, forensic) Signed: 10/21/2020 5:32:36 PM By: Sarah Ham MD Entered By: [name] on 10/21/2020 12:20:18 -------------------------------------------------------------------------------- Physical Exam Details Patient Name: Date of Service: [name] [name] Medical Record Number: [id number] Patient Account Number: [id number] Date of Birth/Sex: Treating RN: 1970-12-01 (50 y.o. [name] Primary Care Provider: Karle Khan Other Clinician: Referring Provider: Treating Provider/Extender: [name] in Treatment: 27 Constitutional Sitting or standing Blood Pressure is within target range for patient.. Pulse regular  and within target range for patient.Marland Kitchen Respirations regular, non-labored and within target range.. Temperature is normal and within the target range for the patient.Marland Kitchen Appears in no distress. Notes Wound exam; left breast still with 0.4 cm of depth which does not give her much granulation over the underlying chest wall. She has some undermining from 12-2 o'clock which overall is Sarah lot better. No evidence of surrounding infection I used Sarah #5 curette to debride the wound surface this has Sarah very fibrinous debris on the surface but I am able to get this to clean up quite nicely. Removing subcutaneous tissue hemostasis with direct Electronic Signature(s) Signed: 10/21/2020 5:32:36 PM By: Sarah Ham MD Entered By: [name] on 10/21/2020 12:24:44 -------------------------------------------------------------------------------- Physician Orders Details Patient Name: Date of Service: [name] [name] Medical Record Number: [id number] Patient Account Number: [id number] Date of Birth/Sex: Treating RN: Nov 06, 1970 (50 y.o. 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 Nurse Visit: - Vac dressing change Friday 10/25/2020 Cellular or Tissue Based Products Cellular or Tissue Based Product Type: - Theraskin for next week Bathing/ Shower/ Hygiene May shower with protection but do not get wound dressing(s)  wet. Negative Presssure Wound Therapy Wound Vac to wound continuously at 11m/hg pressure Black Foam - Apply Santyl to wound bed, under foam Wound Treatment Wound #1 - Chest Wound Laterality: Left Cleanser: Wound Cleanser 2 x Per Week/15 Days Discharge Instructions: Cleanse the wound with wound cleanser prior to applying Sarah clean dressing using gauze sponges, not tissue or cotton balls. Prim Dressing: Santyl Ointment 2 x Per Week/15 Days ary Discharge Instructions: Apply nickel thick amount to wound bed under vac foam Electronic Signature(s) Signed: 10/21/2020 5:32:36 PM By: RLinton HamMD Signed: 10/21/2020 5:53:01 PM By: BLorrin JacksonEntered By: BLorrin Jacksonon 10/21/2020 10:46:21 -------------------------------------------------------------------------------- Problem List Details Patient Name: Date of Service: AMichel Bickers  Sarah L, LO Khan [name] Medical Record Number: [id number] Patient Account Number: [id number] Date of Birth/Sex: Treating RN: November 26, 1970 (50 y.o. 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 [name] Primary Care Provider: Karle Khan Other Clinician: Referring Provider: Treating Provider/Extender: [name] in Treatment: 27 Subjective History of Present Illness (HPI) After canADMISSION 04/09/2020 [name] 50 year old woman who is history of breast cancer in the left breast goes back to 2018. He was found to have Sarah mass in her left breast. [name] and then subsequent chemo that she did not tolerate very well. She had seed implants placed but then underwent external beam Sarah radiation in the spring 2019. 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 Her adjunctive radiation was from 03/11/2017 through 04/21/2017. Unfortunately she had Sarah left breast biopsy on 11/24/2019 and it was again positive for an invasive ductal carcinoma grade 3 he is to resume at receptor 80% positive. HER-2 negative progesterone negative. She opted for bilateral mastectomies which were done on 01/09/2020 on the right with no evidence of malignancy on the left stage IIb invasive ductal carcinoma. She again received chemotherapy but she did not tolerate it because of wound dehiscence and low platelet counts according to the patient. Unfortunately her mastectomy wound dehisced on the left breast. She was taken back to the OR by Dr. Donne Khan of general surgery on 33/24/22 and she underwent Sarah debridement and subsequent attempt at closure. She had been previously treated with Augmentin for suspected underlying infection. Once again the wound has dehisced in the mid aspect. It is roughly 1 cm deep there is tunnels both medially and laterally  she. She still has sutures in place. Still describes constant pain drainage. She also has Sarah drain still in place that drains about 10 cc/day. Currently she is changing ABD pad several times Sarah day 4/12; patient went back to see Dr. Donne Khan who remove most of the stitches and debrided her wound. She has been using silver alginate culture I did last time which was Sarah PCR culture showed Peptostreptococcus, Enterococcus faecalis and Staphylococcus epidermidis which is of questionable significance. [name] that the sutures have been removed. There are undermining areas superior medial superior lateral and Sarah deep tunneling area in the inferior part of the wound. There is no obvious purulence. I started her on Augmentin on 04/12/2020 and I will likely extend this for another week AMAZINGLY; she is already been approved for HBO and will start today. Her chest x-ray was  negative. Echocardiogram which I reviewed from 2019 showed Sarah normal ejection fraction.. I went over side effects of HBO with the patient and answered any of her questions. Her husband was present 5/3; [name]. She went to see Dr. Donne Khan about 2-1/2 weeks ago according to the patient he can remove some of the surrounding debris. She will see him again later this week or early next week. She has been using silver alginate. Sarah lot of this is problematic since she has Medicaid which traditionally does not cover wound care supplies although it traditionally does not cover HBO either Her wound actually looks quite Sarah bit better than the last time I saw this better looking surface healthy looking tissue. She has tunnels at 810 2 in 6 the deepest of these is at 2 at 2.2 cm there is no exposed bone. 5/10; the patient was coming in for nurse visit today but requested that I see the wound because of surface discoloration. She states the Iodoflex makes the surface of this turned "white". She is not  really complaining of undue tenderness. She is changing this every second day. The goal of this had been to get the surface of the wound to Sarah vibrant red color and then potentially look at changing to Sarah silver collagen-based dressing, Puraply, and then perhaps an alternative skin substituteo Derma vest. The patient tells me she has an appoint with her oncologist Dr. Jana Khan on the 19th. She apparently think she is going to get another PET scan. Based on this I would like his comment about whether there is possibility of any malignancy in the wound itself or just underneath it. Up to now we have not really thought that was true 5/17; nice improvement in the chest wound. Less depth. We have been using Iodoflex to clean off the wound surface this seems to have done nicely. Patient went to see Dr. Donne Khan 2 days ago who has referred her to see Dr. Claudia Desanctis. I certainly agree with the thought the surface of the wound looks Sarah lot better now and I think consideration of plastic surgery is certainly something the patient should discuss. I have talked to the patient about this today and I certainly support Sarah conversation with Dr. Claudia Desanctis. I also wondered about is there any residual cancer in this. Dr. Donne Khan apparently did not seem to think so per the patient although I do not see that in the text conversations we have had. I also wonder about Sarah skin substitute if her version of Medicare [[employer]] would cover this. Dr. Jana Khan is apparently considering starting her on [place]. Patient is already very scared about the side effects but she will discuss this with Dr. Jana Khan on 5/19 5/31; patient's wound about the same in terms of size she still has the tunneling area to the left. Surface of the wound looks Sarah lot better there has been some filling in the granulation I have been included on serial text between Dr. Callie Fielding surgery] Dr. Ruffin Frederick oncology] and Dr. Para Skeans surgery]. I think  there is not Sarah lot of thought that the patient has recurrence of her cancer her MRI was indeterminant but Dr. Jana Khan did not feel that there was any reason to think she has Sarah major recurrence and he was not in favor of doing Sarah dedicated breast MRI. Dr. Claudia Desanctis wants to go ahead with Sarah flap closure I did it. I have not reviewed his note but  per the patient [name] latissimus dorsi myocutaneous flap or I think Sarah skin flap from her abdomen. I have talked to the patient I think it is time to attempt this. If I can continue hyperbarics surrounding the flap then we will attempt to do this 6/14; patient using Prisma as the primary dressing here. She has completed hyperbarics and did very well. Her wound is responded nicely. The depth of this and the condition of the granulation tissue looks Sarah lot better. She has undermining areas at 2 and 10:00 both of these seem to be better as well. She is scheduled for flap closure on 7/11 by Dr. Claudia Desanctis 6/29; the patient continues to use Prisma wet-to-dry. Her graft is scheduled for 7/11 by Dr. Claudia Desanctis. We are looking at getting her additional hyperbaric treatment which I think might help with the likelihood of graft survival in this difficult irradiated area 7/21; patient returns to clinic to reinitiate HBO after her surgical procedure by Dr. Claudia Desanctis. On 07/15/2020 she had debridement of left chest wound and Sarah rotational flap closure. She also had excision of right excess axillary tissue. Pathology did not show any malignancy and follow-up Dr. Claudia Desanctis saw the patient yesterday. He noted Sarah 2 cm area of wound dehiscence. Recommended she return for hyperbaric oxygen and she returns today for her first treatment. Follow-up with him in 2 weeks. We were not actually going to see the patient today her appointment to be evaluated was Monday however she was concerned about Sarah "fishy odor". She is also noted some drainage. Not specifically dressing in the wound area but covering with  Adaptic 7/25; I am seeing this wound and follow-up. So far her culture is polymicrobial not growing any specific anything specific nevertheless with the Augmentin and the silver alginate her odor is Sarah lot better. Not as much periwound tenderness. Dimensions measuring 2.7 x 3.3 x 1.6. She has undermining from 10-1 o'clock of about 1 and half centimeters. Necrotic fat on the surface of this... 08/13/2020 upon evaluation today patient appears to be doing decently well in regard to her wound all things considered. Obviously this is much more open than what was hoped for based on the fact that she had Sarah flap performed which was supposed to completely close this wound. Nonetheless currently she tells me that she is not really having any significant pain fortunately the drainage is improved compared to last time there is still some evidence of odor as far as the drainage is concerned but is definitely not as purulent as what was previously noted. With that being said I do believe that there are quite Sarah few sutures which are quite embedded. Nonetheless I believe this needs to be removed. In fact if they are not I am afraid this is again actually grow into the skin and that is can to be even bigger issue. Some of them even so at this point appeared that there could be difficult to even get out. 8/17; this is the first time I am seeing this in about 3 weeks. The patient had stop by my office yesterday to discuss after completing hyperbaric therapy. Based on what she said I was expecting the worst today however apparently with dressing changes and perhaps debridement last week the surface of the wound actually looks better than what I was expecting. There is considerable depth although the last time I saw this the base of the wound was necrotic there is still some necrotic tissue on the surface of  the wound bed however in general the rest the tissue looks healthy even with some bleeding. She has been using cleaning  the wound with Dakin's and then applying Prisma and then backing Dakin's wet-to-dry. This seems to work to clean the wound surface up. They also ordered Sarah wound VAC and I discussed this with her in depth today She is not systemically unwell. She is not running Sarah fever there is no odor. She talks about pain over the lower part of the flap to touch. This does not appear to be related to movement of her shoulder. 8/23; the patient has been using the wound VAC for 1 week. Already Sarah remarkable improvement and she appears to be tolerating this well we had Santyl under the wound VAC however the surface looks so clean today I am doubtful that is going to be necessary She is also being treated with HBO. This is helped her dramatically. Apparently her current certification will be up on 8/31. She is only had 19 dives and we will certainly need to recertify her up for this. Since I saw her last week I put in Sarah secure text message to Dr. Claudia Desanctis through epic to let him know that we were going ahead with Sarah wound VAC. He did not have any objections to this. He is offering her more surgery however in the eventuality that this does not close 8/30; the patient missed 2 treatments of HBO last week because of nausea. I think she is now on Compazine which seems to have helped. Otherwise she is tolerating HBO well. We are collagen under wound VAC. Direct depth of wound measured at 1.2 cm which I think is probably unchanged however the undermining area is down to 0.5 which I think is an improvement 9/6; patient continues with chemotherapy although she says she is troll of her nausea. We are using collagen with Sarah wound VAC on the remaining wound on her chest. No major improvements in the undermining area from 12-5 currently measuring 0.5 cm. This is no better than last week 9/13; patient continuing with chemotherapy. She completes HBO today. We are using silver collagen under wound VAC. No major improvements however  the undermining area seems somewhat better to me. 9/20; we are using silver collagen under the wound VAC and although about 60% of the wound looks healthy. At the base of the wound there is still Sarah very adherent slough. She has been approved for TheraSkin which I would like to use if we can get the wound bed in Sarah cleaner situation. 9/27; we are using Santyl under the wound VAC. Dimensions are slightly better including depth of 0.7 cm. Slight improvement in length and width the undermining area currently at 12-2 done in itself and improvement measures 0.6 which is not too much of improvement in terms of the probable depth 10/4; using Santyl under the wound VAC. Comes in today with Sarah fairly dramatic improvement in surface area and marked improvement in the undermining area as well as condition of the wound bed. This is gratifying to see in view of this were going to continue the Santyl under the [facility] for another week. 10/11; patient presents for follow-up. She reports continued wound healing with Santyl and wound VAC. She has no issues or complaints today. She denies signs of infection. 10/17; although the wound appears to have contracted the measurements are not that much different and more problematically not much improvement in the depth and condition of the wound bed. She  still has Sarah shallow undermining area perhaps it 0.3 that its been somewhat improved. She is using using Santyl under the wound VAC in preparation for Sarah trial of TheraSkin Objective Constitutional Sitting or standing Blood Pressure is within target range for patient.. Pulse regular and within target range for patient.Marland Kitchen Respirations regular, non-labored and within target range.. Temperature is normal and within the target range for the patient.Marland Kitchen Appears in no distress. Vitals Time Taken: 10:08 AM, Height: 67 in, Weight: 257 lbs, BMI: 40.2, Temperature: 98.4 F, Pulse: 101 bpm, Respiratory Rate: 16 breaths/min, Blood Pressure:  121/81 mmHg. General Notes: Wound exam; left breast still with 0.4 cm of depth which does not give her much granulation over the underlying chest wall. She has some undermining from 12-2 o'clock which overall is Sarah lot better. No evidence of surrounding infection Integumentary (Hair, Skin) Wound #1 status is Open. Original cause of wound was Surgical Injury. The date acquired was: 01/16/2020. The wound has been in treatment 27 weeks. The wound is located on the Left Chest. The wound measures 1.8cm length x 2cm width x 0.4cm depth; 2.827cm^2 area and 1.131cm^3 volume. There is Fat Layer (Subcutaneous Tissue) exposed. There is no tunneling or undermining noted. There is Sarah medium amount of serosanguineous drainage noted. The wound margin is well defined and not attached to the wound base. There is large (67-100%) red, pink granulation within the wound bed. There is no necrotic tissue within the wound bed. 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 There was Sarah Excisional Skin/Subcutaneous Tissue Debridement with Sarah total area of 3.6 sq cm performed by Sarah Dillon., MD. With the following instrument(s): Curette to remove Non-Viable tissue/material. Material removed includes Subcutaneous Tissue and Slough and after achieving pain control using Other (Benzocaine). No specimens were taken. Sarah time out was conducted at 10:39, prior to the start of the procedure. Sarah Minimum amount of bleeding was controlled with Pressure. The procedure was tolerated well. Post Debridement Measurements: 1.8cm length x 2cm width x 0.4cm depth;  1.131cm^3 volume. Character of Wound/Ulcer Post Debridement is stable. Post procedure Diagnosis Wound #1: Same as Pre-Procedure Plan Follow-up Appointments: Return Appointment in 1 week. Nurse Visit: - Vac dressing change Friday 10/25/2020 Cellular or Tissue Based Products: Cellular or Tissue Based Product Type: - Theraskin for next week Bathing/ Shower/ Hygiene: May shower with protection but do not get wound dressing(s) wet. Negative Presssure Wound Therapy: Wound Vac to wound continuously at 185m/hg pressure Black Foam - Apply Santyl to wound bed, under foam WOUND #1: - Chest Wound Laterality: Left Cleanser: Wound Cleanser 2 x Per Week/15 Days Discharge Instructions: Cleanse the wound with wound cleanser prior to applying Sarah clean dressing using gauze sponges, not tissue or cotton balls. Prim Dressing: Santyl Ointment 2 x Per Week/15 Days ary Discharge Instructions: Apply nickel thick amount to wound bed under vac foam #1 I am going to move to TheraSkin next week and discontinue the wound VAC. I am hopeful to get Sarah better looking granulation and filling the wound bed. 2. [name] complicated area of surgery and prior radiation but no malignancy. Electronic Signature(s) Signed: 10/21/2020 5:32:36 PM By: RLinton HamMD Entered By: RDellia Nims  Desha Bitner on 10/21/2020 12:23:35 -------------------------------------------------------------------------------- SuperBill Details Patient Name: Date of Service: [name] Sarah D. 10/21/2020 Medical Record Number: [id number] Patient Account Number: [id number] Date of Birth/Sex: Treating RN: 1970/02/11 (50 y.o. 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 Facility Procedures CPT4 Code: [id number] Description: 03474 - DEB SUBQ TISSUE 20 SQ CM/< ICD-10 Diagnosis Description T81.31XD Disruption of external operation (surgical) wound, not elsewhere classified, subs Modifier: equent encounter Quantity: 1 Physician Procedures : CPT4 Code Description Modifier [id number] - WC PHYS SUBQ TISS 20 SQ CM ICD-10 Diagnosis Description T81.31XD Disruption of external operation (surgical) wound, not elsewhere classified, subsequent encounter Quantity: 1 Electronic Signature(s) Signed: 10/21/2020 5:32:36 PM By: Sarah Ham MD Entered By: [name] on 10/21/2020 12:23:52

## 2020-10-22 ENCOUNTER — Encounter (HOSPITAL_BASED_OUTPATIENT_CLINIC_OR_DEPARTMENT_OTHER): Payer: Medicaid Other | Admitting: Internal Medicine

## 2020-10-23 ENCOUNTER — Other Ambulatory Visit (HOSPITAL_COMMUNITY): Payer: Self-pay

## 2020-10-25 ENCOUNTER — Encounter (HOSPITAL_BASED_OUTPATIENT_CLINIC_OR_DEPARTMENT_OTHER): Payer: Medicaid Other | Admitting: Internal Medicine

## 2020-10-29 ENCOUNTER — Encounter (HOSPITAL_BASED_OUTPATIENT_CLINIC_OR_DEPARTMENT_OTHER): Payer: Medicaid Other | Admitting: Internal Medicine

## 2020-10-30 ENCOUNTER — Other Ambulatory Visit: Payer: Self-pay

## 2020-10-30 ENCOUNTER — Inpatient Hospital Stay: Payer: Medicaid Other | Attending: Oncology

## 2020-10-30 ENCOUNTER — Other Ambulatory Visit (HOSPITAL_COMMUNITY): Payer: Self-pay

## 2020-10-30 ENCOUNTER — Inpatient Hospital Stay (HOSPITAL_BASED_OUTPATIENT_CLINIC_OR_DEPARTMENT_OTHER): Payer: Medicaid Other | Admitting: Oncology

## 2020-10-30 VITALS — BP 128/86 | HR 92 | Temp 97.7°F | Resp 18 | Ht 67.5 in | Wt 262.7 lb

## 2020-10-30 DIAGNOSIS — C50912 Malignant neoplasm of unspecified site of left female breast: Secondary | ICD-10-CM

## 2020-10-30 DIAGNOSIS — Z79899 Other long term (current) drug therapy: Secondary | ICD-10-CM | POA: Insufficient documentation

## 2020-10-30 DIAGNOSIS — K429 Umbilical hernia without obstruction or gangrene: Secondary | ICD-10-CM

## 2020-10-30 DIAGNOSIS — C50412 Malignant neoplasm of upper-outer quadrant of left female breast: Secondary | ICD-10-CM | POA: Insufficient documentation

## 2020-10-30 DIAGNOSIS — Z923 Personal history of irradiation: Secondary | ICD-10-CM | POA: Insufficient documentation

## 2020-10-30 DIAGNOSIS — Z171 Estrogen receptor negative status [ER-]: Secondary | ICD-10-CM | POA: Diagnosis not present

## 2020-10-30 DIAGNOSIS — Z95828 Presence of other vascular implants and grafts: Secondary | ICD-10-CM

## 2020-10-30 DIAGNOSIS — Z9221 Personal history of antineoplastic chemotherapy: Secondary | ICD-10-CM | POA: Insufficient documentation

## 2020-10-30 DIAGNOSIS — Z9013 Acquired absence of bilateral breasts and nipples: Secondary | ICD-10-CM | POA: Diagnosis not present

## 2020-10-30 DIAGNOSIS — Z1509 Genetic susceptibility to other malignant neoplasm: Secondary | ICD-10-CM

## 2020-10-30 LAB — CBC WITH DIFFERENTIAL/PLATELET
Abs Immature Granulocytes: 0 10*3/uL (ref 0.00–0.07)
Basophils Absolute: 0 10*3/uL (ref 0.0–0.1)
Basophils Relative: 1 %
Eosinophils Absolute: 0.1 10*3/uL (ref 0.0–0.5)
Eosinophils Relative: 2 %
HCT: 35 % — ABNORMAL LOW (ref 36.0–46.0)
Hemoglobin: 11.3 g/dL — ABNORMAL LOW (ref 12.0–15.0)
Immature Granulocytes: 0 %
Lymphocytes Relative: 32 %
Lymphs Abs: 1.3 10*3/uL (ref 0.7–4.0)
MCH: 29.8 pg (ref 26.0–34.0)
MCHC: 32.3 g/dL (ref 30.0–36.0)
MCV: 92.3 fL (ref 80.0–100.0)
Monocytes Absolute: 0.3 10*3/uL (ref 0.1–1.0)
Monocytes Relative: 7 %
Neutro Abs: 2.3 10*3/uL (ref 1.7–7.7)
Neutrophils Relative %: 58 %
Platelets: 178 10*3/uL (ref 150–400)
RBC: 3.79 MIL/uL — ABNORMAL LOW (ref 3.87–5.11)
RDW: 15.4 % (ref 11.5–15.5)
WBC: 3.9 10*3/uL — ABNORMAL LOW (ref 4.0–10.5)
nRBC: 0 % (ref 0.0–0.2)

## 2020-10-30 LAB — COMPREHENSIVE METABOLIC PANEL
ALT: 20 U/L (ref 0–44)
AST: 22 U/L (ref 15–41)
Albumin: 3.8 g/dL (ref 3.5–5.0)
Alkaline Phosphatase: 82 U/L (ref 38–126)
Anion gap: 10 (ref 5–15)
BUN: 8 mg/dL (ref 6–20)
CO2: 24 mmol/L (ref 22–32)
Calcium: 9.2 mg/dL (ref 8.9–10.3)
Chloride: 108 mmol/L (ref 98–111)
Creatinine, Ser: 0.83 mg/dL (ref 0.44–1.00)
GFR, Estimated: >60 mL/min (ref >60)
Glucose, Bld: 96 mg/dL (ref 70–99)
Potassium: 3.9 mmol/L (ref 3.5–5.1)
Sodium: 142 mmol/L (ref 135–145)
Total Bilirubin: 0.9 mg/dL (ref 0.3–1.2)
Total Protein: 7 g/dL (ref 6.5–8.1)

## 2020-10-30 MED ORDER — OLAPARIB 150 MG PO TABS
300.0000 mg | ORAL_TABLET | Freq: Two times a day (BID) | ORAL | 6 refills | Status: DC
  Filled 2020-10-30 – 2020-11-07 (×2): qty 120, 30d supply, fill #0
  Filled 2020-12-03: qty 120, 30d supply, fill #1
  Filled 2021-01-02: qty 120, 30d supply, fill #2
  Filled 2021-02-27: qty 120, 30d supply, fill #3
  Filled 2021-03-24 – 2021-05-09 (×2): qty 120, 30d supply, fill #4
  Filled 2021-05-28: qty 120, 30d supply, fill #5
  Filled 2021-07-30: qty 120, 30d supply, fill #6

## 2020-10-30 MED ORDER — SODIUM CHLORIDE 0.9% FLUSH
10.0000 mL | Freq: Once | INTRAVENOUS | Status: AC
Start: 2020-10-30 — End: 2020-10-30
  Administered 2020-10-30: 10 mL

## 2020-10-30 MED ORDER — HEPARIN SOD (PORK) LOCK FLUSH 100 UNIT/ML IV SOLN
500.0000 [IU] | Freq: Once | INTRAVENOUS | Status: AC
  Administered 2020-10-30: 500 [IU]

## 2020-10-30 MED ORDER — PROCHLORPERAZINE MALEATE 10 MG PO TABS
10.0000 mg | ORAL_TABLET | Freq: Four times a day (QID) | ORAL | 0 refills | Status: DC | PRN
  Filled 2020-10-30: qty 30, 8d supply, fill #0

## 2020-10-30 NOTE — Progress Notes (Signed)
[place]  [phone] [phone]     ID: [name] DOB: 06-18-1970  MR#: [id number]  QJF#:[id number]  Patient Care Team: [name], MD as PCP - General (Internal Medicine) Falicity Sheets, [name], MD as Consulting Physician (Oncology) [name], MD as Attending Physician (Radiation Oncology) Delice Bison Charlestine Massed, NP as Nurse Practitioner (Hematology and Oncology) Rolm Bookbinder, MD as Consulting Physician (General Surgery) Claudia Desanctis Steffanie Dunn, MD as Consulting Physician (Plastic Surgery) Mauro Kaufmann, RN as Oncology Nurse Navigator Rockwell Germany, RN as Oncology Nurse Navigator Milus Banister, MD as Attending Physician (Gastroenterology) Ricard Dillon, MD as Consulting Physician (Internal Medicine) OTHER MD:   CHIEF COMPLAINT: Triple negative breast cancer; BRCA2 positive  CURRENT TREATMENT: olaparib    INTERVAL HISTORY: [name] returns today for a follow-up of her triple negative breast cancer .  Her husband [name] could not come today: He is working possibly tomorrow at [name]  She completed her hyperbaric treatments on 09/16/2020.  There was significant improvement.  She was approved for a cadaver tissue which is going to be applied later this week.  In the meantime she has a wound VAC that is working well for her.  She was started on olaparib at her last visit on 08/13/2020.  We try to 150 mg daily and she did fine with that.  We went 250 mg twice daily and she is tolerating that well although she has had 1 or 2 episodes of nausea which may or may not be related.  She is interested in going to full dose.  Her bilateral salpingo-oophorectomy and hysterectomy has been postponed until after the current issues have settled down.   REVIEW OF SYSTEMS: [name] is also tolerating anastrozole well although vaginal dryness is an issue.  She is interested in physical therapy for that.  She has concerns that she has not had  any ovarian imaging in about a year and of course at some point she will need to undergo bilateral salpingo-oophorectomy (likely also hysterectomy).  She feels she is getting closer to being ready to do that.  The issue of course is that her chest wound has not yet completely healed over.  She is walking for exercise and generally has more energy than before.  She is "getting back to her normal".  Detailed review of systems today was otherwise stable   COVID 19 VACCINATION STATUS: not vaccinated (as of September 2022)  BREAST CANCER HISTORY: From the original intake note:  The patient was seen in the emergency room 05/16/2016 with nonspecific complaints but also noting that she had a tender lump in her lateral left breast which she said she had noted the day before. Exam by the emergency room physician confirmed a 3 cm soft left lateral breast mass without overlying erythema or nipple changes. This was felt to be most consistent with fibrocystic change but the patient was referred back to her primary care physician for further evaluation. She saw the physician assistant in May 18 and then Dr. Wynetta Emery on 06/12/2016 who scheduled a bilateral diagnostic mammography with tomography and left breast ultrasonography at the [facility] 06/18/2016. This found the breast density to be category B. In the left breast upper outer quadrant there was an area of asymmetry measuring up to 10.3 cm. On exam this was firm and palpable and on ultrasound there was an irregular mass measuring 2.5 cm with some subtle changes in the surrounding tissue. In the left lower axilla there  were 2 suspicious-looking lymph nodes.  Biopsy of the left breast upper outer quadrant and one of the suspicious lymph nodes 06/19/2016 showed ([id number]) both to be involved by invasive ductal carcinoma, grade 3, estrogen and progesterone receptor negative, HER-2 not amplified with a signals ratio being 1.53-1.74 and the number per cell 2.35-3.05.  The MIB-1 was 90%.  The patient's subsequent history is as detailed below.   PAST MEDICAL HISTORY: Past Medical History:  Diagnosis Date   Arthritis    Breast cancer ([place])    Left Breast Cancer   Breast cancer ([place]) 91/47/8295   left    Complication of anesthesia    DAY SURGERY 2010 OR 2011 ASPIRATED AND STAYED OVERNIGHT   History of radiation therapy 03/11/17- 04/21/17   Left breast, 2 Gy in 25 fractions for a total dose of 50 Gy, Boost, 2 Gy in 5 fractions for a total dose of 10 Gy   Hypertension    OFF MEDS  SINCE CHEMO X 5 MONTHS   Malignant neoplasm of upper-outer quadrant of left breast in female, estrogen receptor negative ([place]) 06/23/2016   Personal history of chemotherapy 2019   Left Breast Cancer  07-2016-12-2016   Personal history of radiation therapy 2019   Left Breast Cancer    PAST SURGICAL HISTORY: Past Surgical History:  Procedure Laterality Date   ANKLE SURGERY Right    BREAST CYST EXCISION Right 07/15/2020   Procedure: Excision excess right axillary tissue;  Surgeon: Cindra Presume, MD;  Location: [place];  Service: Plastics;  Laterality: Right;   BREAST LUMPECTOMY Left 01/27/2017   BREAST LUMPECTOMY WITH RADIOACTIVE SEED AND SENTINEL LYMPH NODE BIOPSY Left 01/27/2017   Procedure: LEFT BREAST LUMPECTOMY WITH RADIOACTIVE SEED LOCALIZATION, LEFT AXILLARY DEEP LYMPH NODE BIOPSY WITH RADIOACTIVE SEED LOCALIZATION, LEFT AXILLARY SENTINEL LYMPH NODE MAPPING AND BIOPSY WITH BLUE DYE INJECTION;  Surgeon: [name], MD;  Location: [place];  Service: General;  Laterality: Left;   COLONOSCOPY WITH PROPOFOL N/A 02/01/2020   Procedure: COLONOSCOPY WITH PROPOFOL;  Surgeon: Milus Banister, MD;  Location: [facility];  Service: Endoscopy;  Laterality: N/A;   DEBRIDEMENT AND CLOSURE WOUND Left 03/28/2020   Procedure: DEBRIDEMENT AND CLOSURE OF MASTECTOMY WOUND;  Surgeon: Rolm Bookbinder, MD;  Location: [facility];  Service: General;  Laterality: Left;   DEBRIDEMENT AND CLOSURE  WOUND Left 07/15/2020   Procedure: Debridement of left chest wound and rotational flap closure;  Surgeon: Cindra Presume, MD;  Location: [place];  Service: Plastics;  Laterality: Left;  1.5 hour   ENDOSCOPIC MUCOSAL RESECTION  02/01/2020   Procedure: ENDOSCOPIC MUCOSAL RESECTION;  Surgeon: Milus Banister, MD;  Location[facility];  Service: Endoscopy;;   HEMOSTASIS CLIP PLACEMENT  02/01/2020   Procedure: HEMOSTASIS CLIP PLACEMENT;  Surgeon: Milus Banister, MD;  Location[facility];  Service: Endoscopy;;   IR FLUORO GUIDE PORT INSERTION RIGHT  07/14/2016   IR US GUIDE VASC ACCESS RIGHT  07/14/2016   MASTECTOMY W/ SENTINEL NODE BIOPSY Bilateral 01/09/2020   Procedure: BILATERAL MASTECTOMY WITH LEFT AXILLARY SENTINEL LYMPH NODE BIOPSY;  Surgeon: Rolm Bookbinder, MD;  Location: [place];  Service: General;  Laterality: Bilateral;   PORT-A-CATH REMOVAL Right 01/27/2017   Procedure: REMOVAL PORT-A-CATH;  Surgeon: [name], MD;  Location: [place];  Service: General;  Laterality: Right;   PORTACATH PLACEMENT Right 01/09/2020   Procedure: INSERTION PORT-A-CATH;  Surgeon: Rolm Bookbinder, MD;  Location: [place];  Service: General;  Laterality: Right;  SUBMUCOSAL LIFTING INJECTION  02/01/2020   Procedure: SUBMUCOSAL LIFTING INJECTION;  Surgeon: Milus Banister, MD;  Location: [facility];  Service: Endoscopy;;   TUBAL LIGATION     WISDOM TOOTH EXTRACTION      FAMILY HISTORY Family History  Problem Relation Age of Onset   Diabetes Mother    Hypertension Mother    Arthritis Mother    Colon cancer Father 22       died .41 metastatic at time of diagnosis   Diabetes Father    Heart disease Father    Hypertension Father    Breast cancer Maternal Grandmother 92       d.70s   Breast cancer Maternal Aunt 52   Cervical cancer Maternal Aunt 55   Breast cancer Maternal Aunt 46       d.50s   Cancer Maternal Uncle        d.62s unspecified type of  cancer   Breast cancer Cousin 47       paternal first-cousin (daughter of unaffected aunt)   Cancer Maternal Aunt 74       "Female Cancer"   Cancer Maternal Aunt        unknown cancer   Brain cancer Daughter 51   Esophageal cancer Other    Colon polyps Neg Hx    Rectal cancer Neg Hx    Stomach cancer Neg Hx   The patient's father died at age 23 from colon cancer which she never had treated. The patient's mother is living, 48 years old as of June 2018. The patient has one brother, one sister. On the mother's side there is a history of breast cancer in the grandmother, age 47, and then 1 aunt with breast cancer age 54, and another with lung cancer age 37. There is no history of ovarian cancer in the family.   GYNECOLOGIC HISTORY:  No LMP recorded (lmp unknown). Patient is postmenopausal. Menarche age 29, first live birth age 70. She is GX P4. Her periods have never been regular. She had a period in June 2018 but not for 4 months before that. She took birth control for some years remotely with no complications. She is status post bilateral tubal ligation.   SOCIAL HISTORY: Updated October 2022 The patient's husband owns and runs a food truck.. They serve mostly [place] food and they generally deal with businesses, for example [employer].  [employer] helps with the scheduling. Her husband [name] (goes by "[name]") is originally from [name]. The patient has two children from her first marriage. Her son Sarah Board "Tye Khan, who is 76 years old as of Oct 2021, was previously in the [employer], but now is taking courses at [employer] and  her 94 y/o daughter [name], who was studying psychology at [name] before her optic pathway germinoma, which has left her 97% blind.  She now runs a massage service called "[employer]." Together with [name] the patient has two other children who are 20 ([place], graduating 2022  and going on to graduate school) and 18 (in [employer] service/nutrition).    ADVANCED DIRECTIVES: In the absence of any documents to the contrary the patient's husband is her healthcare power of attorney   HEALTH MAINTENANCE: Social History   Tobacco Use   Smoking status: Never   Smokeless tobacco: Never  Vaping Use   Vaping Use: Never used  Substance Use Topics   Alcohol use: No  Drug use: No     Colonoscopy: 02/01/2020  TDH:[facility] 2018  Bone density:Never   Allergies  Allergen Reactions   Carboplatin Other (See Comments)    Red, blotchy, hot    Current Outpatient Medications  Medication Sig Dispense Refill   prochlorperazine (COMPAZINE) 10 MG tablet Take 1 tablet (10 mg total) by mouth every 6 (six) hours as needed for nausea or vomiting. 30 tablet 0   anastrozole (ARIMIDEX) 1 MG tablet TAKE 1 TABLET BY MOUTH DAILY. 90 tablet 4   COLLAGEN MATRIX MESHED, PORC, EX Apply 1 each topically every other day. Collagen Wound Packing every other day     gabapentin (NEURONTIN) 100 MG capsule TAKE 2 CAPSULES BY MOUTH IN THE MORNING AND 2 CAPSULES IN THE AFTERNOON 240 capsule 3   gabapentin (NEURONTIN) 300 MG capsule Take 3 capsules (900 mg total) by mouth at bedtime. 180 capsule 4   lidocaine-prilocaine (EMLA) cream APPLY TO AFFECTED AREA ONCE AS DIRECTED (Patient taking differently: Apply 1 application topically daily as needed (prior to lab draws/port flushes).) 30 g 3   olaparib (LYNPARZA) 150 MG tablet Take 2 tablets (300 mg total) by mouth 2 (two) times daily. Swallow whole. May take with food to decrease nausea and vomiting.Start May 23, 2020 120 tablet 6   potassium chloride SA (KLOR-CON) 20 MEQ tablet TAKE 1 TABLET BY MOUTH ONCE A DAY (Patient taking differently: Take 20 mEq by mouth at bedtime.) 30 tablet 2   senna-docusate (SENOKOT-S) 8.6-50 MG tablet Take 2 tablets by mouth at bedtime.     sodium hypochlorite (DAKIN'S 1/2 STRENGTH) external solution Moisten gauze  with Dakin's solution then wring out leaving gauze damp not saturated before packing into the wound bed lightly as directed 473 mL 3   traMADol (ULTRAM) 50 MG tablet TAKE 1 TO 2 TABLETS BY MOUTH EVERY 6 HOURS AS NEEDED. 120 tablet 0   No current facility-administered medications for this visit.    OBJECTIVE: White woman in no acute distress  Vitals:   10/30/20 1602  BP: 128/86  Pulse: 92  Resp: 18  Temp: 97.7 F (36.5 C)  SpO2: 99%       Body mass index is 40.54 kg/m.   Filed Weights   10/30/20 1602  Weight: 262 lb 11.2 oz (119.2 kg)     ECOG FS:1 - Symptomatic but completely ambulatory  Sclerae unicteric, EOMs intact Wearing a mask No cervical or supraclavicular adenopathy Lungs no rales or rhonchi Heart regular rate and rhythm Abd soft, nontender, positive bowel sounds MSK no focal spinal tenderness, no upper extremity lymphedema Neuro: nonfocal, well oriented, appropriate affect Breasts: She is status post bilateral mastectomies there is no finding of concern on the right.  This chest wall wound on the left has been revised and there is now only a very small shallow area which has not completely healed over, measuring about 2-1/2 x 2 cm.  Left chest wall 09/30/2020    Left chest wall 08/13/2020 Left chest wall 05/23/2020     LAB RESULTS:  CMP     Component Value Date/Time   NA 142 10/30/2020 1550   NA 140 01/07/2017 1342   K 3.9 10/30/2020 1550   K 3.5 01/07/2017 1342   CL 108 10/30/2020 1550   CO2 24 10/30/2020 1550   CO2 27 01/07/2017 1342   GLUCOSE 96 10/30/2020 1550   GLUCOSE 148 (H) 01/07/2017 1342   BUN 8 10/30/2020 1550   BUN 6.2 (L) 01/07/2017 1342   CREATININE  0.83 10/30/2020 1550   CREATININE 0.79 12/28/2019 1322   CREATININE 0.7 01/07/2017 1342   CALCIUM 9.2 10/30/2020 1550   CALCIUM 9.4 01/07/2017 1342   PROT 7.0 10/30/2020 1550   PROT 7.0 01/07/2017 1342   ALBUMIN 3.8 10/30/2020 1550   ALBUMIN 3.7 01/07/2017 1342   AST 22  10/30/2020 1550   AST 20 12/28/2019 1322   AST 23 01/07/2017 1342   ALT 20 10/30/2020 1550   ALT 19 12/28/2019 1322   ALT 24 01/07/2017 1342   ALKPHOS 82 10/30/2020 1550   ALKPHOS 105 01/07/2017 1342   BILITOT 0.9 10/30/2020 1550   BILITOT 0.9 12/28/2019 1322   BILITOT 0.84 01/07/2017 1342   GFRNONAA >60 10/30/2020 1550   GFRNONAA >60 12/28/2019 1322   GFRAA >60 05/23/2019 1921   GFRAA >60 10/13/2018 1346    No results found for: [name], A1GS, A2GS, BETS, BETA2SER, GAMS, MSPIKE, SPEI  No results found for: Nils Pyle, [facility]  Lab Results  Component Value Date   WBC 3.9 (L) 10/30/2020   NEUTROABS 2.3 10/30/2020   HGB 11.3 (L) 10/30/2020   HCT 35.0 (L) 10/30/2020   MCV 92.3 10/30/2020   PLT 178 10/30/2020      Chemistry      Component Value Date/Time   NA 142 10/30/2020 1550   NA 140 01/07/2017 1342   K 3.9 10/30/2020 1550   K 3.5 01/07/2017 1342   CL 108 10/30/2020 1550   CO2 24 10/30/2020 1550   CO2 27 01/07/2017 1342   BUN 8 10/30/2020 1550   BUN 6.2 (L) 01/07/2017 1342   CREATININE 0.83 10/30/2020 1550   CREATININE 0.79 12/28/2019 1322   CREATININE 0.7 01/07/2017 1342      Component Value Date/Time   CALCIUM 9.2 10/30/2020 1550   CALCIUM 9.4 01/07/2017 1342   ALKPHOS 82 10/30/2020 1550   ALKPHOS 105 01/07/2017 1342   AST 22 10/30/2020 1550   AST 20 12/28/2019 1322   AST 23 01/07/2017 1342   ALT 20 10/30/2020 1550   ALT 19 12/28/2019 1322   ALT 24 01/07/2017 1342   BILITOT 0.9 10/30/2020 1550   BILITOT 0.9 12/28/2019 1322   BILITOT 0.84 01/07/2017 1342       No results found for: LABCA2  No components found for: [id number]  No results for input(s): INR in the last 168 hours.  Urinalysis    Component Value Date/Time   COLORURINE YELLOW 12/05/2019 1154   APPEARANCEUR CLOUDY (A) 12/05/2019 1154   LABSPEC 1.024 12/05/2019 1154   LABSPEC 1.025 11/23/2016 1251   PHURINE 5.0 12/05/2019 1154   GLUCOSEU  NEGATIVE 12/05/2019 1154   GLUCOSEU Negative 11/23/2016 1251   HGBUR SMALL (A) 12/05/2019 1154   BILIRUBINUR NEGATIVE 12/05/2019 1154   BILIRUBINUR Negative 11/23/2016 1251   KETONESUR NEGATIVE 12/05/2019 1154   PROTEINUR NEGATIVE 12/05/2019 1154   UROBILINOGEN 0.2 11/23/2016 1251   NITRITE NEGATIVE 12/05/2019 1154   LEUKOCYTESUR NEGATIVE 12/05/2019 1154   LEUKOCYTESUR Negative 11/23/2016 1251    STUDIES: No results found.   ELIGIBLE FOR AVAILABLE RESEARCH PROTOCOL: no   ASSESSMENT: 50 y.o. [place] woman status post left breast upper outer quadrant and left axillary lymph node biopsy 06/19/2016, both positive for a clinical T2-T3 N1, stage IIIB-C invasive ductal carcinoma, grade 3, triple negative, with an MIB-1 of 90%  (1) genetics testing 08/04/2016 showed a variant of uncertain significance in the BRCA2 namely c.8169T>A (p.[id number]). This has been classified as likely pathogenic by [place] [facility] but not  other labs. Additional testing of the patient's mother and maternal aunt is pending. Otherwise [facility] Common Hereditary Cancers Panel found no deleterious mutations in APC, ATM, AXIN2, BARD1, BMPR1A, BRCA1, BRCA2, BRIP1, CDH1, CDKN2A, CHEK2, CTNNA1, DICER1, EPCAM, GREM1, HOXB13, KIT, MEN1, MLH1, MSH2, MSH3, MSH6, MUTYH, NBN, NF1, NTHL1, PALB2, PDGFRA, PMS2, POLD1, POLE, PTEN, RAD50, RAD51C, RAD51D, SDHA, SDHB, SDHC, SDHD, SMAD4, SMARCA4, STK11, TP53, TSC1, TSC2, and VHL.  (2) neoadjuvant chemotherapy consisting of doxorubicin and cyclophosphamide in dose dense fashion 4 completed 09/10/2016 followed by paclitaxel weekly 12 given with carboplatin   (a) cycle 4 of cyclophosphamide and doxorubicin was delayed 10 days and dose decreased 10% because of febrile neutropenia after cycle 3  (b) cycle 6 of Paclitaxel and Carboplatin delayed due to neutropenia, therefore Granix added to Wednesday, Thursday, Friday following chemotherapy days  (3) status post left lumpectomy and left  axillary sentinel lymph node sampling 01/27/2017 with pathology showing a complete pathologic response (ypT0 ypN0);   (a) 5 left axillary lymph nodes removed  (4) adjuvant radiation 03/11/2017-04/21/2017  Site/dose:   1. Left breast, 2 Gy in 25 fractions for a total dose of 50 Gy                      2. 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  (8) status post bilateral mastectomies 01/09/2020 showing  (a) on the right, no evidence of malignancy  (b) on the left, a pT2 pNX, stage IIB invasive ductal carcinoma, grade 3, with ample margins   (c) repeat prognostic panel on the left again weakly estrogen receptor positive, HER2 negative, progesterone receptor negative  (9) adjuvant therapy with cyclophosphamide, methotrexate, fluorouracil (CMF) given every 21 days x 8, started 02/19/2020  (a) cycle 2 and subsequent cycles held due to surgical wound dehiscence  (10) left chest wall wound dehiscence following mastectomy January 2022.   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  ERV B2 and PI K3 CA were negative, and the microsatellite status was stable, with low mutational burden (4/MB)  (12) anastrozole started 04/18/2020   (A) olaparib added 08/13/2020 and 150 mg once a day  (B) dose increased to 150 mg twice daily as of 09/30/2020  (C) dose increased to 300 mg twice daily (target dose) as of 10/30/2020   PLAN: Ryenne is coming up on a year from her definitive surgery.  There is no evidence of active disease.  This is very favorable.  She is tolerating olaparib well at half dose.  She is motivated to try the target  dose and so we increased that today to 300 mg twice daily.  I am throwing in some Compazine just in case.  If she has any problems she will let me know.  We can always do 1 tablet in the morning and 2 in the evening.  I think she will benefit from physical therapy to the pelvis and that referral has been placed.  We are also referring her back to [facility] (Dr.Tucker) to discuss timing of her bilateral salpingo-oophorectomy plus or minus hysterectomy and possibly to do some imaging before the definitive surgery since she has not had any imaging for a while.  I am going to do a virtual visit with her in about 6 weeks just to make sure she is tolerating the olaparib well  Total encounter time 35 minutes.* Total encounter time 30 minutes.*  Total encounter time 35 minutes.*   Katira Dumais, [name], MD  10/30/20 4:31 PM Medical Oncology and Hematology [facility] [place], [place] [address] Tel. [phone]    Fax. [phone]   I, Wilburn Mylar, am acting as scribe for Dr. [name]. Robinn Overholt.  I, Lurline Del MD, have reviewed the above documentation for accuracy and completeness, and I agree with the above.   *Total Encounter Time as defined by the Centers for Medicare and Medicaid Services includes, in addition to the  face-to-face time of a patient visit (documented in the note above) non-face-to-face time: obtaining and reviewing outside history, ordering and reviewing medications, tests or procedures, care coordination (communications with other health care professionals or caregivers) and documentation in the medical record.

## 2020-10-31 ENCOUNTER — Other Ambulatory Visit (HOSPITAL_COMMUNITY): Payer: Self-pay

## 2020-10-31 ENCOUNTER — Encounter (HOSPITAL_BASED_OUTPATIENT_CLINIC_OR_DEPARTMENT_OTHER): Payer: Medicaid Other | Admitting: Internal Medicine

## 2020-10-31 DIAGNOSIS — T8131XA Disruption of external operation (surgical) wound, not elsewhere classified, initial encounter: Secondary | ICD-10-CM | POA: Diagnosis not present

## 2020-10-31 MED FILL — Potassium Chloride Microencapsulated Crys ER Tab 20 mEq: ORAL | 30 days supply | Qty: 30 | Fill #1 | Status: AC

## 2020-11-05 ENCOUNTER — Other Ambulatory Visit (HOSPITAL_COMMUNITY): Payer: Self-pay

## 2020-11-05 NOTE — Progress Notes (Signed)
TYRISHA, BENNINGER ([id number]) Visit Report for 10/31/2020 Cellular or Tissue Based Product Details Patient Name: Date of Service: [name] 10/31/2020 8:15 A M Medical Record Number: [id number] Patient Account Number: [id number] Date of Birth/Sex: Treating RN: 01-22-1970 (50 y.o. [name], [name] Primary Care Provider: [name] Other Clinician: Referring Provider: Treating Provider/Extender: [name] in Treatment: 29 Cellular or Tissue Based Product Type Wound #1 Left Chest Applied to: Performed By: Physician Ricard Dillon., MD Cellular or Tissue Based Product Type: Theraskin Level of Consciousness (Pre-procedure): Awake and Alert Pre-procedure Verification/Time Out Yes - 08:45 Taken: Location: trunk / arms / legs Wound Size (sq cm): 4.56 Product Size (sq cm): 13 Waste Size (sq cm): 0 Amount of Product Applied (sq cm): 13 Instrument Used: Forceps, Scissors Lot #: [id number] Order #: 1 Expiration Date: 03/15/2023 Fenestrated: No Reconstituted: Yes Solution Type: saline Solution Amount: 6 ml Lot #: [id number] Solution Expiration Date: 09/05/2021 Secured: Yes Secured With: Steri-Strips Dressing Applied: Yes Primary Dressing: drawtex, adaptic Procedural Pain: 0 Post Procedural Pain: 0 Response to Treatment: Procedure was tolerated well Level of Consciousness (Post- Awake and Alert procedure): Post Procedure Diagnosis Same as Pre-procedure Electronic Signature(s) Signed: 11/05/2020 4:29:54 PM By: [name] MD Entered By: [name] on 10/31/2020 09:01:41 -------------------------------------------------------------------------------- HPI Details Patient Name: Date of Service: [name] [name] 10/31/2020 8:15 A M Medical Record Number: [id number] Patient Account Number: [id number] Date of Birth/Sex: Treating RN: 1970-08-03 (50 y.o. [name] Primary Care Provider: [name] Other  Clinician: Referring Provider: Treating Provider/Extender: [name] in Treatment: 29 History of Present Illness HPI Description: After canADMISSION 04/09/2020 This is a 50 year old woman who is history of breast cancer in the left breast goes back to 2018. He was found to have a mass in her left breast. She underwent a left back lumpectomy and then subsequent chemo that she did not tolerate very well. She had seed implants placed but then underwent external beam a radiation in the spring 2019. 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 Her adjunctive radiation was from 03/11/2017 through 04/21/2017. Unfortunately she had a left breast biopsy on 11/24/2019 and it was again positive for an invasive ductal carcinoma grade 3 he is to resume at receptor 80% positive. HER-2 negative progesterone negative. She opted for bilateral mastectomies which were done on 01/09/2020 on the right with no evidence of malignancy on the left stage IIb invasive ductal carcinoma. She again received chemotherapy but she did not tolerate it because of wound dehiscence and low platelet counts according to the patient. Unfortunately her mastectomy wound dehisced on the left breast. She was taken back to the OR by [name] of general surgery on 33/24/22 and she underwent a debridement and subsequent attempt at closure. She had been previously treated with Augmentin for suspected underlying infection. Once again the wound has dehisced in the mid aspect. It is roughly 1 cm deep there is tunnels both medially and laterally she. She still has sutures in place. Still describes constant pain drainage. She also has a drain still in place that drains about 10 cc/day. Currently she is changing ABD pad several times a day 4/12; patient went back to see [name] who  remove most of the stitches and debrided her wound. She has been using silver  alginate culture I did last time which was a PCR culture showed Peptostreptococcus, Enterococcus faecalis and Staphylococcus epidermidis which is of questionable significance. She has a larger wound now that the sutures have been removed. There are undermining areas superior medial superior lateral and a deep tunneling area in the inferior part of the wound. There is no obvious purulence. I started her on Augmentin on 04/12/2020 and I will likely extend this for another week AMAZINGLY; she is already been approved for HBO and will start today. [name] Echocardiogram which I reviewed from 2019 showed a normal ejection fraction.. I went over side effects of HBO with the patient and answered any of her questions. Her husband was present 5/3; its been a while since have seen this wound. She went to see [name] about 2-1/2 weeks ago according to the patient he can remove some of the surrounding debris. She will see him again later this week or early next week. She has been using silver alginate. A lot of this is problematic since she has Medicaid which traditionally does not cover wound care supplies although it traditionally does not cover HBO either Her wound actually looks quite a bit better than the last time I saw this better looking surface healthy looking tissue. She has tunnels at 810 2 in 6 the deepest of these is at 2 at 2.2 cm there is no exposed bone. 5/10; the patient was coming in for nurse visit today but requested that I see the wound because of surface discoloration. She states the Iodoflex makes the surface of this turned "white". She is not really complaining of undue tenderness. She is changing this every second day. The goal of this had been to get the surface of the wound to a vibrant red color and then potentially look at changing to a silver collagen-based dressing, Puraply, and  then perhaps an alternative skin substituteo Derma vest. The patient tells me she has an appoint with her oncologist [name] on the 19th. She apparently think she is going to get another PET scan. Based on this I would like his comment about whether there is possibility of any malignancy in the wound itself or just underneath it. Up to now we have not really thought that was true 5/17; nice improvement in the chest wound. Less depth. We have been using Iodoflex to clean off the wound surface this seems to have done nicely. Patient went to see [name] 2 days ago who has referred her to see Dr. Claudia Desanctis. I certainly agree with the thought the surface of the wound looks a lot better now and I think consideration of plastic surgery is certainly something the patient should discuss. I have talked to the patient about this today and I certainly support a conversation with Dr. Claudia Desanctis. I also wondered about is there any residual cancer in this. [name] apparently did not seem to think so per the patient although I do not see that in the text conversations we have had. I also wonder about a skin substitute if her version of Medicare [[employer]] would cover this. [name] is apparently considering starting her on [place]. Patient is already very scared about the side effects but she will discuss this with [name] on 5/19 5/31; patient's wound about the same in terms of size she still has the tunneling area to the left. Surface of the wound looks a lot better there has been some filling in the granulation I  have been included on serial text between Dr. Callie Fielding surgery] Dr. Ruffin Frederick oncology] and Dr. Para Skeans surgery]. I think there is not a lot of thought that the patient has recurrence of her cancer her MRI was indeterminant but [name] did not feel that there was any reason to think she has a major recurrence and he was not in favor of doing a dedicated breast  MRI. Dr. Claudia Desanctis wants to go ahead with a flap closure I did it. I have not reviewed his note but per the patient this is a latissimus dorsi myocutaneous flap or I think a skin flap from her abdomen. I have talked to the patient I think it is time to attempt this. If I can continue hyperbarics surrounding the flap then we will attempt to do this 6/14; patient using Prisma as the primary dressing here. She has completed hyperbarics and did very well. Her wound is responded nicely. The depth of this and the condition of the granulation tissue looks a lot better. She has undermining areas at 2 and 10:00 both of these seem to be better as well. She is scheduled for flap closure on 7/11 by Dr. Claudia Desanctis 6/29; the patient continues to use Prisma wet-to-dry. Her graft is scheduled for 7/11 by Dr. Claudia Desanctis. We are looking at getting her additional hyperbaric treatment which I think might help with the likelihood of graft survival in this difficult irradiated area 7/21; patient returns to clinic to reinitiate HBO after her surgical procedure by Dr. Claudia Desanctis. On 07/15/2020 she had debridement of left chest wound and a rotational flap closure. She also had excision of right excess axillary tissue. Pathology did not show any malignancy and follow-up Dr. Claudia Desanctis saw the patient yesterday. He noted a 2 cm area of wound dehiscence. Recommended she return for hyperbaric oxygen and she returns today for her first treatment. Follow-up with him in 2 weeks. We were not actually going to see the patient today her appointment to be evaluated was Monday however she was concerned about a "fishy odor". She is also noted some drainage. Not specifically dressing in the wound area but covering with Adaptic 7/25; I am seeing this wound and follow-up. So far her culture is polymicrobial not growing any specific anything specific nevertheless with the Augmentin and the silver alginate her odor is a lot better. Not as much periwound tenderness.  Dimensions measuring 2.7 x 3.3 x 1.6. She has undermining from 10-1 o'clock of about 1 and half centimeters. Necrotic fat on the surface of this... 08/13/2020 upon evaluation today patient appears to be doing decently well in regard to her wound all things considered. Obviously this is much more open than what was hoped for based on the fact that she had a flap performed which was supposed to completely close this wound. Nonetheless currently she tells me that she is not really having any significant pain fortunately the drainage is improved compared to last time there is still some evidence of odor as far as the drainage is concerned but is definitely not as purulent as what was previously noted. With that being said I do believe that there are quite a few sutures which are quite embedded. Nonetheless I believe this needs to be removed. In fact if they are not I am afraid this is again actually grow into the skin and that is can to be even bigger issue. Some of them even so at this point appeared that there could be difficult to even  get out. 8/17; this is the first time I am seeing this in about 3 weeks. The patient had stop by my office yesterday to discuss after completing hyperbaric therapy. Based on what she said I was expecting the worst today however apparently with dressing changes and perhaps debridement last week the surface of the wound actually looks better than what I was expecting. There is considerable depth although the last time I saw this the base of the wound was necrotic there is still some necrotic tissue on the surface of the wound bed however in general the rest the tissue looks healthy even with some bleeding. She has been using cleaning the wound with Dakin's and then applying Prisma and then backing Dakin's wet-to-dry. This seems to work to clean the wound surface up. They also ordered a wound VAC and I discussed this with her in depth today She is not systemically unwell. She  is not running a fever there is no odor. She talks about pain over the lower part of the flap to touch. This does not appear to be related to movement of her shoulder. 8/23; the patient has been using the wound VAC for 1 week. Already a remarkable improvement and she appears to be tolerating this well we had Santyl under the wound VAC however the surface looks so clean today I am doubtful that is going to be necessary She is also being treated with HBO. This is helped her dramatically. Apparently her current certification will be up on 8/31. She is only had 19 dives and we will certainly need to recertify her up for this. Since I saw her last week I put in a secure text message to Dr. Claudia Desanctis through epic to let him know that we were going ahead with a wound VAC. He did not have any objections to this. He is offering her more surgery however in the eventuality that this does not close 8/30; the patient missed 2 treatments of HBO last week because of nausea. I think she is now on Compazine which seems to have helped. Otherwise she is tolerating HBO well. We are collagen under wound VAC. Direct depth of wound measured at 1.2 cm which I think is probably unchanged however the undermining area is down to 0.5 which I think is an improvement 9/6; patient continues with chemotherapy although she says she is troll of her nausea. We are using collagen with a wound VAC on the remaining wound on her chest. No major improvements in the undermining area from 12-5 currently measuring 0.5 cm. This is no better than last week 9/13; patient continuing with chemotherapy. She completes HBO today. We are using silver collagen under wound VAC. No major improvements however the undermining area seems somewhat better to me. 9/20; we are using silver collagen under the wound VAC and although about 60% of the wound looks healthy. At the base of the wound there is still a very adherent slough. She has been approved for  TheraSkin which I would like to use if we can get the wound bed in a cleaner situation. 9/27; we are using Santyl under the wound VAC. Dimensions are slightly better including depth of 0.7 cm. Slight improvement in length and width the undermining area currently at 12-2 done in itself and improvement measures 0.6 which is not too much of improvement in terms of the probable depth 10/4; using Santyl under the wound VAC. Comes in today with a fairly dramatic improvement in surface area and marked  improvement in the undermining area as well as condition of the wound bed. This is gratifying to see in view of this were going to continue the Santyl under the [facility] for another week. 10/11; patient presents for follow-up. She reports continued wound healing with Santyl and wound VAC. She has no issues or complaints today. She denies signs of infection. 10/17; although the wound appears to have contracted the measurements are not that much different and more problematically not much improvement in the depth and condition of the wound bed. She still has a shallow undermining area perhaps it 0.3 that its been somewhat improved. She is using using Santyl under the wound VAC in preparation for a trial of TheraSkin 10/27; wound looked quite good today. We went ahead with TheraSkin #1 today. The wound VAC can be discontinued Electronic Signature(s) Signed: 11/05/2020 4:29:54 PM By: [name] MD Entered By: [name] on 10/31/2020 09:02:15 -------------------------------------------------------------------------------- Physical Exam Details Patient Name: Date of Service: [name] 10/31/2020 8:15 A M Medical Record Number: [id number] Patient Account Number: [id number] Date of Birth/Sex: Treating RN: May 17, 1970 (50 y.o. [name] Primary Care Provider: [name] Other Clinician: Referring Provider: Treating Provider/Extender: [name] in  Treatment: 29 Notes Wound exam; still 0.4 cm of depth but some of this looks like it is filling in. Condition of the wound bed looks healthy. The undermining from 12-2 o'clock is down to a couple of millimeters. I applied TheraSkin #1 in the standard fashion Electronic Signature(s) Signed: 11/05/2020 4:29:54 PM By: [name] MD Entered By: [name] on 10/31/2020 09:03:04 -------------------------------------------------------------------------------- Physician Orders Details Patient Name: Date of Service: [name] 10/31/2020 8:15 A M Medical Record Number: [id number] Patient Account Number: [id number] Date of Birth/Sex: Treating RN: 1970-08-05 (50 y.o. [name] Primary Care Provider: [name] Other Clinician: Referring Provider: Treating Provider/Extender: [name] in Treatment: 29 Verbal / Phone Orders: No Diagnosis Coding Follow-up Appointments ppointment in 2 weeks. - plan theraskin #2 Dr. Dellia Nims Return A Nurse Visit: - 1 week post theraskin Cellular or Tissue Based Products Cellular or Tissue Based Product Type: - Theraskin #1 daptic or Mepitel. (DO NOT REMOVE). - Cellular or Tissue Based Product applied to wound bed, secured with steri-strips, cover with A drawtex. may replace secondary gauze as needed for drainage Bathing/ Shower/ Hygiene May shower with protection but do not get wound dressing(s) wet. Negative Presssure Wound Therapy Wound Vac to wound continuously at 137m/hg pressure - hold VAC this week Black Foam - Apply Santyl to wound bed, under foam Wound Treatment Wound #1 - Chest Wound Laterality: Left Cleanser: Wound Cleanser 2 x Per Week/15 Days Discharge Instructions: Cleanse the wound with wound cleanser prior to applying a clean dressing using gauze sponges, not tissue or cotton balls. Prim Dressing: TheraSkin ary 2 x Per Week/15 Days Secondary Dressing: Woven Gauze Sponge,  Non-Sterile 4x4 in 2 x Per Week/15 Days Discharge Instructions: Apply over primary dressing as directed. Secondary Dressing: Drawtex 4x4 in 2 x Per Week/15 Days Discharge Instructions: Apply over primary dressing as directed. Secondary Dressing: ADAPTIC TOUCH 3x4.25 in 2 x Per Week/15 Days Discharge Instructions: Apply over primary dressing as directed. Secured With: Tegaderm Transparent Film Dressing 4x4.75 (in/in) 2 x Per Week/15 Days Discharge Instructions: Secure dressing with Tegaderm as directed. Electronic Signature(s) Signed: 10/31/2020 5:29:08 PM By: BBaruch GoutyRN, BSN Signed: 11/05/2020 4:29:54 PM By: RLinton HamMD  Entered By: Baruch Gouty on 10/31/2020 09:02:56 -------------------------------------------------------------------------------- Problem List Details Patient Name: Date of Service: [name] 10/31/2020 8:15 A M Medical Record Number: [id number] Patient Account Number: [id number] Date of Birth/Sex: Treating RN: [birth year] (50 y.o. 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 10/31/2020 8:15 A M Medical Record Number: [id number] Patient Account Number: [id number] Date of Birth/Sex: Treating RN: 15-Jun-1970 (50 y.o. [name] Primary Care Provider: [name] Other Clinician: Referring Provider: Treating Provider/Extender: [name] in Treatment: 29 Subjective History of Present Illness (HPI) After canADMISSION 04/09/2020 This is a 50 year old woman who is history of breast cancer in the left breast goes back to 2018. He was found to have a mass in her left breast. She underwent a left back lumpectomy and then subsequent chemo that she did not tolerate very well. She had seed implants placed but then underwent external beam a radiation in the spring 2019. 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 Her adjunctive radiation was from 03/11/2017 through 04/21/2017. Unfortunately she had a left breast biopsy on 11/24/2019 and it was again positive for an invasive ductal carcinoma grade 3 he is to resume at receptor 80% positive. HER-2 negative progesterone negative. She opted for bilateral mastectomies which were done on 01/09/2020 on the right with no evidence of malignancy on the left stage IIb invasive ductal carcinoma. She again received chemotherapy but she did not tolerate it because of wound dehiscence and low platelet counts according to the patient. Unfortunately her mastectomy wound dehisced on the left breast. She was taken back to the OR by [name] of general surgery on 33/24/22 and she underwent a debridement and subsequent attempt at closure. She had been previously treated with Augmentin for suspected underlying infection. Once again the wound has dehisced in the mid aspect. It is roughly 1 cm deep there is tunnels both medially and laterally she. She  still has sutures in place. Still describes constant pain drainage. She also has a drain still in place that drains about 10 cc/day. Currently she is changing ABD pad several times a day 4/12; patient went back to see [name] who remove most of the stitches and debrided her wound. She has been using silver alginate culture I did last time which was a PCR culture showed Peptostreptococcus, Enterococcus faecalis and Staphylococcus epidermidis which is of questionable significance. She has a larger wound now that the sutures have been removed. There are undermining areas superior medial superior lateral and a deep tunneling area in the inferior part of the wound. There is no obvious purulence. I started her on Augmentin on 04/12/2020 and I will likely extend this for  another week AMAZINGLY; she is already been approved for HBO and will start today. [name] Echocardiogram which I reviewed from 2019 showed a normal ejection fraction.. I went over side effects of HBO with the patient and answered any of her questions. Her husband was present 5/3; its been a while since have seen this wound. She went to see [name] about 2-1/2 weeks ago according to the patient he can remove some of the surrounding debris. She will see him again later this week or early next week. She has been using silver alginate. A lot of this is problematic since she has Medicaid which traditionally does not cover wound care supplies although it traditionally does not cover HBO either Her wound actually looks quite a bit better than the last time I saw this better looking surface healthy looking tissue. She has tunnels at 810 2 in 6 the deepest of these is at 2 at 2.2 cm there is no exposed bone. 5/10; the patient was coming in for nurse visit today but requested that I see the wound because of surface discoloration. She states the Iodoflex makes the surface of this turned "white". She is not really  complaining of undue tenderness. She is changing this every second day. The goal of this had been to get the surface of the wound to a vibrant red color and then potentially look at changing to a silver collagen-based dressing, Puraply, and then perhaps an alternative skin substituteo Derma vest. The patient tells me she has an appoint with her oncologist [name] on the 19th. She apparently think she is going to get another PET scan. Based on this I would like his comment about whether there is possibility of any malignancy in the wound itself or just underneath it. Up to now we have not really thought that was true 5/17; nice improvement in the chest wound. Less depth. We have been using Iodoflex to clean off the wound surface this seems to have done nicely. Patient went to see [name] 2 days ago who has referred her to see Dr. Claudia Desanctis. I certainly agree with the thought the surface of the wound looks a lot better now and I think consideration of plastic surgery is certainly something the patient should discuss. I have talked to the patient about this today and I certainly support a conversation with Dr. Claudia Desanctis. I also wondered about is there any residual cancer in this. [name] apparently did not seem to think so per the patient although I do not see that in the text conversations we have had. I also wonder about a skin substitute if her version of Medicare [[employer]] would cover this. [name] is apparently considering starting her on [place]. Patient is already very scared about the side effects but she will discuss this with [name] on 5/19 5/31; patient's wound about the same in terms of size she still has the tunneling area to the left. Surface of the wound looks a lot better there has been some filling in the granulation I have been included on serial text between Dr. Callie Fielding surgery] Dr. Ruffin Frederick oncology] and Dr. Para Skeans surgery]. I think there is  not a lot of thought that the patient has recurrence of her cancer her MRI was indeterminant but [name] did not feel that there was any reason to think she has a major recurrence and he was not in favor of doing a dedicated breast MRI. Dr. Claudia Desanctis  wants to go ahead with a flap closure I did it. I have not reviewed his note but per the patient this is a latissimus dorsi myocutaneous flap or I think a skin flap from her abdomen. I have talked to the patient I think it is time to attempt this. If I can continue hyperbarics surrounding the flap then we will attempt to do this 6/14; patient using Prisma as the primary dressing here. She has completed hyperbarics and did very well. Her wound is responded nicely. The depth of this and the condition of the granulation tissue looks a lot better. She has undermining areas at 2 and 10:00 both of these seem to be better as well. She is scheduled for flap closure on 7/11 by Dr. Claudia Desanctis 6/29; the patient continues to use Prisma wet-to-dry. Her graft is scheduled for 7/11 by Dr. Claudia Desanctis. We are looking at getting her additional hyperbaric treatment which I think might help with the likelihood of graft survival in this difficult irradiated area 7/21; patient returns to clinic to reinitiate HBO after her surgical procedure by Dr. Claudia Desanctis. On 07/15/2020 she had debridement of left chest wound and a rotational flap closure. She also had excision of right excess axillary tissue. Pathology did not show any malignancy and follow-up Dr. Claudia Desanctis saw the patient yesterday. He noted a 2 cm area of wound dehiscence. Recommended she return for hyperbaric oxygen and she returns today for her first treatment. Follow-up with him in 2 weeks. We were not actually going to see the patient today her appointment to be evaluated was Monday however she was concerned about a "fishy odor". She is also noted some drainage. Not specifically dressing in the wound area but covering with Adaptic 7/25; I  am seeing this wound and follow-up. So far her culture is polymicrobial not growing any specific anything specific nevertheless with the Augmentin and the silver alginate her odor is a lot better. Not as much periwound tenderness. Dimensions measuring 2.7 x 3.3 x 1.6. She has undermining from 10-1 o'clock of about 1 and half centimeters. Necrotic fat on the surface of this... 08/13/2020 upon evaluation today patient appears to be doing decently well in regard to her wound all things considered. Obviously this is much more open than what was hoped for based on the fact that she had a flap performed which was supposed to completely close this wound. Nonetheless currently she tells me that she is not really having any significant pain fortunately the drainage is improved compared to last time there is still some evidence of odor as far as the drainage is concerned but is definitely not as purulent as what was previously noted. With that being said I do believe that there are quite a few sutures which are quite embedded. Nonetheless I believe this needs to be removed. In fact if they are not I am afraid this is again actually grow into the skin and that is can to be even bigger issue. Some of them even so at this point appeared that there could be difficult to even get out. 8/17; this is the first time I am seeing this in about 3 weeks. The patient had stop by my office yesterday to discuss after completing hyperbaric therapy. Based on what she said I was expecting the worst today however apparently with dressing changes and perhaps debridement last week the surface of the wound actually looks better than what I was expecting. There is considerable depth although the last time I saw  this the base of the wound was necrotic there is still some necrotic tissue on the surface of the wound bed however in general the rest the tissue looks healthy even with some bleeding. She has been using cleaning the wound with  Dakin's and then applying Prisma and then backing Dakin's wet-to-dry. This seems to work to clean the wound surface up. They also ordered a wound VAC and I discussed this with her in depth today She is not systemically unwell. She is not running a fever there is no odor. She talks about pain over the lower part of the flap to touch. This does not appear to be related to movement of her shoulder. 8/23; the patient has been using the wound VAC for 1 week. Already a remarkable improvement and she appears to be tolerating this well we had Santyl under the wound VAC however the surface looks so clean today I am doubtful that is going to be necessary She is also being treated with HBO. This is helped her dramatically. Apparently her current certification will be up on 8/31. She is only had 19 dives and we will certainly need to recertify her up for this. Since I saw her last week I put in a secure text message to Dr. Claudia Desanctis through epic to let him know that we were going ahead with a wound VAC. He did not have any objections to this. He is offering her more surgery however in the eventuality that this does not close 8/30; the patient missed 2 treatments of HBO last week because of nausea. I think she is now on Compazine which seems to have helped. Otherwise she is tolerating HBO well. We are collagen under wound VAC. Direct depth of wound measured at 1.2 cm which I think is probably unchanged however the undermining area is down to 0.5 which I think is an improvement 9/6; patient continues with chemotherapy although she says she is troll of her nausea. We are using collagen with a wound VAC on the remaining wound on her chest. No major improvements in the undermining area from 12-5 currently measuring 0.5 cm. This is no better than last week 9/13; patient continuing with chemotherapy. She completes HBO today. We are using silver collagen under wound VAC. No major improvements however the undermining area  seems somewhat better to me. 9/20; we are using silver collagen under the wound VAC and although about 60% of the wound looks healthy. At the base of the wound there is still a very adherent slough. She has been approved for TheraSkin which I would like to use if we can get the wound bed in a cleaner situation. 9/27; we are using Santyl under the wound VAC. Dimensions are slightly better including depth of 0.7 cm. Slight improvement in length and width the undermining area currently at 12-2 done in itself and improvement measures 0.6 which is not too much of improvement in terms of the probable depth 10/4; using Santyl under the wound VAC. Comes in today with a fairly dramatic improvement in surface area and marked improvement in the undermining area as well as condition of the wound bed. This is gratifying to see in view of this were going to continue the Santyl under the [facility] for another week. 10/11; patient presents for follow-up. She reports continued wound healing with Santyl and wound VAC. She has no issues or complaints today. She denies signs of infection. 10/17; although the wound appears to have contracted the measurements are not that  much different and more problematically not much improvement in the depth and condition of the wound bed. She still has a shallow undermining area perhaps it 0.3 that its been somewhat improved. She is using using Santyl under the wound VAC in preparation for a trial of TheraSkin 10/27; wound looked quite good today. We went ahead with TheraSkin #1 today. The wound VAC can be discontinued Objective Constitutional Vitals Time Taken: 8:29 AM, Height: 67 in, Source: Stated, Weight: 257 lbs, Source: Stated, BMI: 40.2, Temperature: 98.4 F, Pulse: 83 bpm, Respiratory Rate: 18 breaths/min, Blood Pressure: 104/73 mmHg. Integumentary (Hair, Skin) Wound #1 status is Open. Original cause of wound was Surgical Injury. The date acquired was: 01/16/2020. The wound has  been in treatment 29 weeks. The wound is located on the Left Chest. The wound measures 1.9cm length x 2.4cm width x 0.2cm depth; 3.581cm^2 area and 0.716cm^3 volume. There is Fat Layer (Subcutaneous Tissue) exposed. There is no tunneling noted, however, there is undermining starting at 12:00 and ending at 1:00 with a maximum distance of 0.4cm. There is a medium amount of serosanguineous drainage noted. The wound margin is well defined and not attached to the wound base. There is medium (34-66%) red, pink granulation within the wound bed. There is a medium (34-66%) amount of necrotic tissue within the wound bed including Adherent Slough. 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 A skin graft procedure using a bioengineered skin substitute/cellular or tissue based product was performed by Ricard Dillon., MD with the following instrument(s): Forceps and Scissors. Theraskin was applied and secured with Steri-Strips. 13 sq cm of product was utilized and 0 sq cm was wasted. Post Application, drawtex, adaptic was applied. A Time Out was conducted at 08:45, prior to the start of the procedure. The procedure was tolerated well with a pain level of 0 throughout and a pain level of 0 following the procedure. Post procedure Diagnosis Wound #1: Same as Pre-Procedure . Plan Follow-up Appointments: Return Appointment in 2 weeks. - plan theraskin #2 Dr. Dellia Nims Nurse Visit: - 1 week post theraskin Cellular or Tissue Based Products: Cellular or Tissue Based Product Type: - Theraskin #1 Cellular or Tissue Based Product applied to  wound bed, secured with steri-strips, cover with Adaptic or Mepitel. (DO NOT REMOVE). - drawtex. may replace secondary gauze as needed for drainage Bathing/ Shower/ Hygiene: May shower with protection but do not get wound dressing(s) wet. Negative Presssure Wound Therapy: Wound Vac to wound continuously at 173m/hg pressure - hold VAC this week Black Foam - Apply Santyl to wound bed, under foam WOUND #1: - Chest Wound Laterality: Left Cleanser: Wound Cleanser 2 x Per Week/15 Days Discharge Instructions: Cleanse the wound with wound cleanser prior to applying a clean dressing using gauze sponges, not tissue or cotton balls. Prim Dressing: TheraSkin 2 x Per Week/15 Days ary Secondary Dressing: Woven Gauze Sponge, Non-Sterile 4x4 in 2 x Per Week/15 Days Discharge Instructions: Apply over primary dressing as directed. Secondary Dressing: Drawtex 4x4 in 2 x Per Week/15 Days Discharge Instructions: Apply over primary dressing as directed. Secondary Dressing: ADAPTIC TOUCH 3x4.25 in 2 x Per Week/15 Days Discharge Instructions: Apply over primary dressing as directed. Secured With:  Tegaderm Transparent Film Dressing 4x4.75 (in/in) 2 x Per Week/15 Days Discharge Instructions: Secure dressing with Tegaderm as directed. 1. Very difficult wound surgical in the setting of radiation for breast cancer. 2. We have made very good progress with the undermining area. The surface of the wound looks healthy. I went ahead and applied TheraSkin #1 3. I see no reason for continued use of the wound VAC currently. I am really hopeful that TheraSkin will stimulate granulation to fill in the wound volume Electronic Signature(s) Signed: 11/05/2020 4:29:54 PM By: [name] MD Entered By: [name] on 10/31/2020 09:04:09 -------------------------------------------------------------------------------- SuperBill Details Patient Name: Date of Service: [name] [name] 10/31/2020 Medical Record Number:  [id number] Patient Account Number: [id number] Date of Birth/Sex: Treating RN: [birth year] (50 y.o. 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 Facility Procedures CPT4 Code: 94585929 Description: [id number]- Theraskin per 1sq cm small -13 sq cm Modifier: Quantity: 13 CPT4 Code: [id number] Description: 15271 - SKIN SUB GRAFT TRNK/ARM/LEG ICD-10 Diagnosis Description T81.31XD Disruption of external operation (surgical) wound, not elsewhere classified, subs L59.8 Other specified disorders of the skin and subcutaneous tissue related to  radiatio Modifier: equent encounter n Quantity: 1 Physician Procedures : CPT4 Code Description Modifier [id number] - WC PHYS LEVEL 3 - EST PT ICD-10 Diagnosis Description T81.31XD Disruption of external operation (surgical) wound, not elsewhere classified, subsequent encounter L59.8 Other specified disorders of the skin  and subcutaneous tissue related to radiation Quantity: 1 : [id number] 91916 - WC PHYS SKIN SUB GRAFT TRNK/ARM/LEG ICD-10 Diagnosis Description T81.31XD Disruption of external operation (surgical) wound, not elsewhere classified, subsequent encounter L59.8 Other specified disorders of the skin and subcutaneous  tissue related to radiation Quantity: 1 Electronic Signature(s) Signed: 10/31/2020 5:29:08 PM By: Baruch Gouty RN, BSN Signed: 11/05/2020 4:29:54 PM By: [name] MD Entered By: Baruch Gouty on 10/31/2020 09:10:04

## 2020-11-05 NOTE — Progress Notes (Signed)
[name], [name] ([id number]) Visit Report for 10/31/2020 Arrival Information Details Patient Name: Date of Service: [name] [name] 10/31/2020 8:15 [name] Medical Record Number: [id number] Patient Account Number: [id number] Date of Birth/Sex: Treating RN: 04/30/1970 (50 y.o. [name] Primary Care Arika Mainer: [name] Other Clinician: Referring Leonna Schlee: Treating Mylin Gignac/Extender: [name] in Treatment: 29 Visit Information History Since Last Visit Added or deleted any medications: No Patient Arrived: Ambulatory Any new allergies or adverse reactions: No Arrival Time: 08:28 Had Sarah fall or experienced change in No Accompanied By: self activities of daily living that may affect Transfer Assistance: None risk of falls: Patient Identification Verified: Yes Signs or symptoms of abuse/neglect since last visito No Secondary Verification Process Completed: Yes Hospitalized since last visit: No Patient Requires Transmission-Based Precautions: No Implantable device outside of the clinic excluding No Patient Has Alerts: No cellular tissue based products placed in the center since last visit: Has Dressing in Place as Prescribed: Yes Pain Present Now: Yes Electronic Signature(s) Signed: 10/31/2020 5:29:08 PM By: Baruch Gouty RN, BSN Entered By: Baruch Gouty on 10/31/2020 08:28:51 -------------------------------------------------------------------------------- Encounter Discharge Information Details Patient Name: Date of Service: [name] [name] 10/31/2020 8:15 [name] Medical Record Number: [id number] Patient Account Number: [id number] Date of Birth/Sex: Treating RN: June 16, 1970 (50 y.o. [name] Primary Care Anajulia Leyendecker: [name] Other Clinician: Referring Briann Sarchet: Treating Layney Gillson/Extender: [name] in Treatment: 29 Encounter Discharge Information Items Post Procedure  Vitals Discharge Condition: Stable Temperature (F): 98.4 Ambulatory Status: Ambulatory Pulse (bpm): 83 Discharge Destination: Home Respiratory Rate (breaths/min): 18 Transportation: Private Auto Blood Pressure (mmHg): 104/73 Accompanied By: self Schedule Follow-up Appointment: Yes Clinical Summary of Care: Patient Declined Electronic Signature(s) Signed: 10/31/2020 5:29:08 PM By: Baruch Gouty RN, BSN Entered By: Baruch Gouty on 10/31/2020 09:11:13 -------------------------------------------------------------------------------- Lower Extremity Assessment Details Patient Name: Date of Service: [name] [name] 10/31/2020 8:15 [name] Medical Record Number: [id number] Patient Account Number: [id number] Date of Birth/Sex: Treating RN: 09-Mar-1970 (50 y.o. [name] Primary Care Icesis Renn: [name] Other Clinician: Referring Trichelle Lehan: Treating Tashaun Obey/Extender: [name] in Treatment: 29 Electronic Signature(s) Signed: 10/31/2020 5:29:08 PM By: Baruch Gouty RN, BSN Entered By: Baruch Gouty on 10/31/2020 08:31:29 -------------------------------------------------------------------------------- Multi Wound Chart Details Patient Name: Date of Service: [name] [name] 10/31/2020 8:15 [name] Medical Record Number: [id number] Patient Account Number: [id number] Date of Birth/Sex: Treating RN: 22-Oct-1970 (50 y.o. [name] Primary Care Geovani Tootle: [name] Other Clinician: Referring Eldonna Neuenfeldt: Treating Elgene Coral/Extender: [name] in Treatment: 29 Vital Signs Height(in): 67 Pulse(bpm): 83 Weight(lbs): 023 Blood Pressure(mmHg): 104/73 Body Mass Index(BMI): 40 Temperature(F): 98.4 Respiratory Rate(breaths/min): 18 Photos: [N/Sarah:N/Sarah] Left Chest N/Sarah N/Sarah Wound Location: Surgical Injury N/Sarah N/Sarah Wounding Event: Open Surgical Wound N/Sarah N/Sarah Primary Etiology: Necrosis  (Radiation) N/Sarah N/Sarah Secondary Etiology: Hypertension, Osteoarthritis, ReceivedN/Sarah N/Sarah Comorbid History: Chemotherapy, Received Radiation 01/16/2020 N/Sarah N/Sarah Date Acquired: 66 N/Sarah N/Sarah Weeks of Treatment: Open N/Sarah N/Sarah Wound Status: 1.9x2.4x0.2 N/Sarah N/Sarah Measurements L x W x D (cm) 3.581 N/Sarah N/Sarah Sarah (cm) : rea 0.716 N/Sarah N/Sarah Volume (cm) : 43.70% N/Sarah N/Sarah % Reduction in Sarah rea: 89.80% N/Sarah N/Sarah % Reduction in Volume: 12 Starting Position 1 (o'clock): 1 Ending Position 1 (o'clock): 0.4 Maximum Distance 1 (cm): Yes N/Sarah N/Sarah Undermining: Full Thickness Without Exposed N/Sarah N/Sarah Classification: Support Structures Medium N/Sarah N/Sarah  Exudate Amount: Serosanguineous N/Sarah N/Sarah Exudate Type: red, brown N/Sarah N/Sarah Exudate Color: Well defined, not attached N/Sarah N/Sarah Wound Margin: Medium (34-66%) N/Sarah N/Sarah Granulation Amount: Red, Pink N/Sarah N/Sarah Granulation Quality: Medium (34-66%) N/Sarah N/Sarah Necrotic Amount: Fat Layer (Subcutaneous Tissue): Yes N/Sarah N/Sarah Exposed Structures: Fascia: No Tendon: No Muscle: No Joint: No Bone: No Small (1-33%) N/Sarah N/Sarah Epithelialization: Cellular or Tissue Based Product N/Sarah N/Sarah Procedures Performed: Treatment Notes Electronic Signature(s) Signed: 10/31/2020 5:43:20 PM By: Deon Pilling RN, BSN Signed: 11/05/2020 4:29:54 PM By: [name] MD Entered By: [name] on 10/31/2020 09:01:24 -------------------------------------------------------------------------------- Multi-Disciplinary Care Plan Details Patient Name: Date of Service: [name] [name] 10/31/2020 8:15 [name] Medical Record Number: [id number] Patient Account Number: [id number] Date of Birth/Sex: Treating RN: [birth year] (50 y.o. [name] Primary Care Arik Husmann: [name] Other Clinician: Referring Rachard Isidro: Treating Lateka Rady/Extender: [name] in Treatment: 29 Active Inactive Wound/Skin Impairment Nursing Diagnoses: Impaired tissue  integrity Knowledge deficit related to smoking impact on wound healing Knowledge deficit related to ulceration/compromised skin integrity Goals: Patient will demonstrate Sarah reduced rate of smoking or cessation of smoking Date Initiated: 04/09/2020 Date Inactivated: 06/18/2020 Target Resolution Date: 06/28/2020 Goal Status: Met Patient will have Sarah decrease in wound volume by X% from date: (specify in notes) Date Initiated: 04/09/2020 Date Inactivated: 07/03/2020 Target Resolution Date: 07/05/2020 Goal Status: Met Patient/caregiver will verbalize understanding of skin care regimen Date Initiated: 04/09/2020 Target Resolution Date: 11/28/2020 Goal Status: Active Ulcer/skin breakdown will have Sarah volume reduction of 30% by week 4 Date Initiated: 04/09/2020 Date Inactivated: 05/14/2020 Target Resolution Date: 05/11/2020 Unmet Reason: see wound Goal Status: Unmet meaurements. Interventions: Assess patient/caregiver ability to obtain necessary supplies Assess patient/caregiver ability to perform ulcer/skin care regimen upon admission and as needed Assess ulceration(s) every visit Provide education on smoking Notes: Electronic Signature(s) Signed: 10/31/2020 5:29:08 PM By: Baruch Gouty RN, BSN Entered By: Baruch Gouty on 10/31/2020 09:08:01 -------------------------------------------------------------------------------- Pain Assessment Details Patient Name: Date of Service: [name] [name] 10/31/2020 8:15 [name] Medical Record Number: [id number] Patient Account Number: [id number] Date of Birth/Sex: Treating RN: [birth year] (50 y.o. [name] Primary Care Jalene Lacko: [name] Other Clinician: Referring Chloe Baig: Treating Zettie Gootee/Extender: [name] in Treatment: 29 Active Problems Location of Pain Severity and Description of Pain Patient Has Paino Yes Site Locations Pain Location: Generalized Pain With Dressing Change: Yes Duration of  the Pain. Constant / Intermittento Constant Rate the pain. Current Pain Level: 3 Worst Pain Level: 3 Least Pain Level: 2 Character of Pain Describe the Pain: Aching Pain Management and Medication Current Pain Management: Medication: Yes Is the Current Pain Management Adequate: Adequate How does your wound impact your activities of daily livingo Sleep: Yes Bathing: No Appetite: No Relationship With Others: No Bladder Continence: No Emotions: No Bowel Continence: No Work: No Toileting: No Drive: No Dressing: No Hobbies: No Engineer, maintenance) Signed: 10/31/2020 5:29:08 PM By: Baruch Gouty RN, BSN Entered By: Baruch Gouty on 10/31/2020 08:30:55 -------------------------------------------------------------------------------- Patient/Caregiver Education Details Patient Name: Date of Service: [name] [name] 10/27/2022andnbsp8:15 [name] Medical Record Number: [id number] Patient Account Number: [id number] Date of Birth/Gender: Treating RN: 1970-11-19 (50 y.o. [name] Primary Care Physician: [name] Other Clinician: Referring Physician: Treating Physician/Extender: [name] in Treatment: 29 Education Assessment Education Provided To: Patient Education Topics Provided Wound/Skin Impairment: Methods: Explain/Verbal Responses: Reinforcements needed, State content correctly Electronic  Signature(s) Signed: 10/31/2020 5:29:08 PM By: Baruch Gouty RN, BSN Entered By: Baruch Gouty on 10/31/2020 09:08:20 -------------------------------------------------------------------------------- Wound Assessment Details Patient Name: Date of Service: [name] [name] 10/31/2020 8:15 [name] Medical Record Number: [id number] Patient Account Number: [id number] Date of Birth/Sex: Treating RN: 10/06/1970 (51 y.o. [name] Primary Care Rayder Sullenger: [name] Other Clinician: Referring Yatzil Clippinger: Treating  Jt Brabec/Extender: [name] in Treatment: 29 Wound Status Wound Number: 1 Primary Open Surgical Wound Etiology: Wound Location: Left Chest Secondary Necrosis (Radiation) Wounding Event: Surgical Injury Etiology: Date Acquired: 01/16/2020 Wound Status: Open Weeks Of Treatment: 29 Comorbid Hypertension, Osteoarthritis, Received Chemotherapy, Clustered Wound: No History: Received Radiation Photos Wound Measurements Length: (cm) 1.9 Width: (cm) 2.4 Depth: (cm) 0.2 Area: (cm) 3.581 Volume: (cm) 0.716 % Reduction in Area: 43.7% % Reduction in Volume: 89.8% Epithelialization: Small (1-33%) Tunneling: No Undermining: Yes Starting Position (o'clock): 12 Ending Position (o'clock): 1 Maximum Distance: (cm) 0.4 Wound Description Classification: Full Thickness Without Exposed Support Structures Wound Margin: Well defined, not attached Exudate Amount: Medium Exudate Type: Serosanguineous Exudate Color: red, brown Foul Odor After Cleansing: No Slough/Fibrino No Wound Bed Granulation Amount: Medium (34-66%) Exposed Structure Granulation Quality: Red, Pink Fascia Exposed: No Necrotic Amount: Medium (34-66%) Fat Layer (Subcutaneous Tissue) Exposed: Yes Necrotic Quality: Adherent Slough Tendon Exposed: No Muscle Exposed: No Joint Exposed: No Bone Exposed: No Treatment Notes Wound #1 (Chest) Wound Laterality: Left Cleanser Wound Cleanser Discharge Instruction: Cleanse the wound with wound cleanser prior to applying Sarah clean dressing using gauze sponges, not tissue or cotton balls. Peri-Wound Care Topical Primary Dressing TheraSkin Secondary Dressing Woven Gauze Sponge, Non-Sterile 4x4 in Discharge Instruction: Apply over primary dressing as directed. Drawtex 4x4 in Discharge Instruction: Apply over primary dressing as directed. ADAPTIC TOUCH 3x4.25 in Discharge Instruction: Apply over primary dressing as directed. Secured With Tegaderm  Transparent Film Dressing 4x4.75 (in/in) Discharge Instruction: Secure dressing with Tegaderm as directed. Compression Wrap Compression Stockings Add-Ons Electronic Signature(s) Signed: 10/31/2020 5:29:08 PM By: Baruch Gouty RN, BSN Signed: 10/31/2020 5:43:20 PM By: Deon Pilling RN, BSN Entered By: Deon Pilling on 10/31/2020 08:36:15 -------------------------------------------------------------------------------- Vitals Details Patient Name: Date of Service: [name] [name] 10/31/2020 8:15 [name] Medical Record Number: [id number] Patient Account Number: [id number] Date of Birth/Sex: Treating RN: November 24, 1970 (50 y.o. [name], [name] Primary Care Stephane Junkins: [name] Other Clinician: Referring Bryna Razavi: Treating Kamarii Carton/Extender: [name] in Treatment: 29 Vital Signs Time Taken: 08:29 Temperature (F): 98.4 Height (in): 67 Pulse (bpm): 83 Source: Stated Respiratory Rate (breaths/min): 18 Weight (lbs): 257 Blood Pressure (mmHg): 104/73 Source: Stated Reference Range: 80 - 120 mg / dl Body Mass Index (BMI): 40.2 Electronic Signature(s) Signed: 10/31/2020 5:29:08 PM By: Baruch Gouty RN, BSN Entered By: Baruch Gouty on 10/31/2020 08:30:20

## 2020-11-06 ENCOUNTER — Other Ambulatory Visit (HOSPITAL_COMMUNITY): Payer: Self-pay

## 2020-11-07 ENCOUNTER — Other Ambulatory Visit: Payer: Self-pay

## 2020-11-07 ENCOUNTER — Encounter (HOSPITAL_BASED_OUTPATIENT_CLINIC_OR_DEPARTMENT_OTHER): Payer: Medicaid Other | Attending: Internal Medicine | Admitting: Internal Medicine

## 2020-11-07 ENCOUNTER — Other Ambulatory Visit (HOSPITAL_COMMUNITY): Payer: Self-pay

## 2020-11-07 DIAGNOSIS — T8131XD Disruption of external operation (surgical) wound, not elsewhere classified, subsequent encounter: Secondary | ICD-10-CM | POA: Diagnosis not present

## 2020-11-07 DIAGNOSIS — Z5111 Encounter for antineoplastic chemotherapy: Secondary | ICD-10-CM | POA: Insufficient documentation

## 2020-11-07 DIAGNOSIS — X58XXXA Exposure to other specified factors, initial encounter: Secondary | ICD-10-CM | POA: Diagnosis not present

## 2020-11-07 DIAGNOSIS — Z853 Personal history of malignant neoplasm of breast: Secondary | ICD-10-CM | POA: Insufficient documentation

## 2020-11-07 DIAGNOSIS — Z923 Personal history of irradiation: Secondary | ICD-10-CM | POA: Diagnosis not present

## 2020-11-07 NOTE — Progress Notes (Signed)
JOSE, ALLEYNE ([id number]) Visit Report for 11/07/2020 SuperBill Details Patient Name: Date of Service: [name] 11/07/2020 Medical Record Number: [id number] Patient Account Number: [id number] Date of Birth/Sex: Treating RN: [birth year] (50 y.o. 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

## 2020-11-07 NOTE — Progress Notes (Signed)
ELMIRE, [name] ([id number]) Visit Report for 11/07/2020 Arrival Information Details Patient Name: Date of Service: [name] [name] 11/07/2020 1:00 PM Medical Record Number: [id number] Patient Account Number: [id number] Date of Birth/Sex: Treating RN: 21-May-1970 (50 y.o. [name], [name] Primary Care [name]: [name] Other Clinician: Referring [name]: Treating [name]/Extender: [name] in Treatment: 77 Visit Information History Since Last Visit Added or deleted any medications: No Patient Arrived: Ambulatory Any new allergies or adverse reactions: No Arrival Time: 13:15 Had Sarah fall or experienced change in No Accompanied By: self activities of daily living that may affect Transfer Assistance: None risk of falls: Patient Identification Verified: Yes Signs or symptoms of abuse/neglect since last visito No Secondary Verification Process Completed: Yes Hospitalized since last visit: No Patient Requires Transmission-Based Precautions: No Implantable device outside of the clinic excluding No Patient Has Alerts: No cellular tissue based products placed in the center since last visit: Has Dressing in Place as Prescribed: Yes Pain Present Now: No Electronic Signature(s) Signed: 11/07/2020 6:11:26 PM By: Sarah Pilling RN, BSN Entered By: [name] on 11/07/2020 14:11:14 -------------------------------------------------------------------------------- Clinic Level of Care Assessment Details Patient Name: Date of Service: Sarah BDELA Sarah L, [name] 11/07/2020 1:00 PM Medical Record Number: [id number] Patient Account Number: [id number] Date of Birth/Sex: Treating RN: [birth year] (50 y.o. [name], [name] Primary Care Jonaya Freshour: [name] Other Clinician: Referring [name]: Treating [name]/Extender: [name] in Treatment: 30 Clinic Level of Care Assessment Items TOOL 4 Quantity Score X- 1 0 Use  when only an EandM is performed on FOLLOW-UP visit ASSESSMENTS - Nursing Assessment / Reassessment X- 1 10 Reassessment of Co-morbidities (includes updates in patient status) X- 1 5 Reassessment of Adherence to Treatment Plan ASSESSMENTS - Wound and Skin Sarah ssessment / Reassessment X - Simple Wound Assessment / Reassessment - one wound 1 5 []  - 0 Complex Wound Assessment / Reassessment - multiple wounds X- 1 10 Dermatologic / Skin Assessment (not related to wound area) ASSESSMENTS - Focused Assessment X- 1 5 Circumferential Edema Measurements - multi extremities X- 1 10 Nutritional Assessment / Counseling / Intervention []  - 0 Lower Extremity Assessment (monofilament, tuning fork, pulses) []  - 0 Peripheral Arterial Disease Assessment (using hand held doppler) ASSESSMENTS - Ostomy and/or Continence Assessment and Care []  - 0 Incontinence Assessment and Management []  - 0 Ostomy Care Assessment and Management (repouching, etc.) PROCESS - Coordination of Care X - Simple Patient / Family Education for ongoing care 1 15 []  - 0 Complex (extensive) Patient / Family Education for ongoing care X- 1 10 Staff obtains Programmer, systems, Records, T Results / Process Orders est []  - 0 Staff telephones HHA, Nursing Homes / Clarify orders / etc []  - 0 Routine Transfer to another Facility (non-emergent condition) []  - 0 Routine Hospital Admission (non-emergent condition) []  - 0 New Admissions / Biomedical engineer / Ordering NPWT Apligraf, etc. , []  - 0 Emergency Hospital Admission (emergent condition) X- 1 10 Simple Discharge Coordination []  - 0 Complex (extensive) Discharge Coordination PROCESS - Special Needs []  - 0 Pediatric / Minor Patient Management []  - 0 Isolation Patient Management []  - 0 Hearing / Language / Visual special needs []  - 0 Assessment of Community assistance (transportation, D/C planning, etc.) []  - 0 Additional assistance / Altered mentation []  - 0 Support  Surface(s) Assessment (bed, cushion, seat, etc.) INTERVENTIONS - Wound Cleansing / Measurement []  - 0 Simple Wound Cleansing - one wound []  -  0 Complex Wound Cleansing - multiple wounds []  - 0 Wound Imaging (photographs - any number of wounds) []  - 0 Wound Tracing (instead of photographs) []  - 0 Simple Wound Measurement - one wound []  - 0 Complex Wound Measurement - multiple wounds INTERVENTIONS - Wound Dressings []  - 0 Small Wound Dressing one or multiple wounds X- 1 15 Medium Wound Dressing one or multiple wounds []  - 0 Large Wound Dressing one or multiple wounds []  - 0 Application of Medications - topical []  - 0 Application of Medications - injection INTERVENTIONS - Miscellaneous []  - 0 External ear exam []  - 0 Specimen Collection (cultures, biopsies, blood, body fluids, etc.) []  - 0 Specimen(s) / Culture(s) sent or taken to Lab for analysis []  - 0 Patient Transfer (multiple staff / Civil Service fast streamer / Similar devices) []  - 0 Simple Staple / Suture removal (25 or less) []  - 0 Complex Staple / Suture removal (26 or more) []  - 0 Hypo / Hyperglycemic Management (close monitor of Blood Glucose) []  - 0 Ankle / Brachial Index (ABI) - do not check if billed separately X- 1 5 Vital Signs Has the patient been seen at the hospital within the last three years: Yes Total Score: 100 Level Of Care: New/Established - Level 3 Electronic Signature(s) Signed: 11/07/2020 6:11:26 PM By: Sarah Pilling RN, BSN Entered By: [name] on 11/07/2020 14:15:13 -------------------------------------------------------------------------------- Encounter Discharge Information Details Patient Name: Date of Service: [name], [name] 11/07/2020 1:00 PM Medical Record Number: [id number] Patient Account Number: [id number] Date of Birth/Sex: Treating RN: 30-Nov-1970 (50 y.o. [name] Primary Care [name]: [name] Other Clinician: Referring [name]: Treating [name]/Extender:  [name] in Treatment: 30 Encounter Discharge Information Items Discharge Condition: Stable Ambulatory Status: Ambulatory Discharge Destination: Home Transportation: Private Auto Accompanied By: self Schedule Follow-up Appointment: Yes Clinical Summary of Care: Electronic Signature(s) Signed: 11/07/2020 6:11:26 PM By: Sarah Pilling RN, BSN Entered By: [name] on 11/07/2020 14:13:55 -------------------------------------------------------------------------------- Patient/Caregiver Education Details Patient Name: Date of Service: [name] [name] 11/3/2022andnbsp1:00 PM Medical Record Number: [id number] Patient Account Number: [id number] Date of Birth/Gender: Treating RN: 04-Nov-1970 (50 y.o. [name] Primary Care Physician: [name] Other Clinician: Referring Physician: Treating Physician/Extender: [name] in Treatment: 69 Education Assessment Education Provided To: Patient Education Topics Provided Wound/Skin Impairment: Handouts: Skin Care Do's and Dont's Methods: Explain/Verbal Responses: Reinforcements needed Electronic Signature(s) Signed: 11/07/2020 6:11:26 PM By: Sarah Pilling RN, BSN Entered By: [name] on 11/07/2020 14:13:40 -------------------------------------------------------------------------------- Wound Assessment Details Patient Name: Date of Service: [name], [name] 11/07/2020 1:00 PM Medical Record Number: [id number] Patient Account Number: [id number] Date of Birth/Sex: Treating RN: 30-Jan-1970 (50 y.o. [name], [name] Primary Care Celine Dishman: [name] Other Clinician: Referring Laurie Lovejoy: Treating Erminie Foulks/Extender: [name] in Treatment: 30 Wound Status Wound Number: 1 Primary Etiology: Open Surgical Wound Wound Location: Left Chest Secondary Etiology: Necrosis (Radiation) Wounding Event: Surgical Injury Wound  Status: Open Date Acquired: 01/16/2020 Weeks Of Treatment: 30 Clustered Wound: No Wound Measurements Length: (cm) 1.9 Width: (cm) 2.4 Depth: (cm) 0.2 Area: (cm) 3.581 Volume: (cm) 0.716 % Reduction in Area: 43.7% % Reduction in Volume: 89.8% Wound Description Classification: Full Thickness Without Exposed Support Struc Exudate Amount: Medium Exudate Type: Serosanguineous Exudate Color: red, brown tures Treatment Notes Wound #1 (Chest) Wound Laterality: Left Cleanser Peri-Wound Care Topical Primary Dressing Secondary Dressing Secured With Compression Wrap Compression  Stockings Add-Ons Notes Left theraskin and drawtex in intacted and in place. adaptic fell off. replaced adaptic, gauze, carboflex, and abd pad secured with drape. Sanjit Mcmichael provided Sarah verbal to use carboflex related to drainage on dressing causing odor at times. Electronic Signature(s) Signed: 11/07/2020 6:11:26 PM By: Sarah Pilling RN, BSN Entered By: [name] on 11/07/2020 14:11:46 -------------------------------------------------------------------------------- Vitals Details Patient Name: Date of Service: [name], [name] 11/07/2020 1:00 PM Medical Record Number: [id number] Patient Account Number: [id number] Date of Birth/Sex: Treating RN: Nov 27, 1970 (50 y.o. [name], [name] Primary Care Lauraine Crespo: [name] Other Clinician: Referring Bayani Renteria: Treating Sesar Madewell/Extender: [name] in Treatment: 30 Vital Signs Time Taken: 13:15 Temperature (F): 98.7 Height (in): 67 Pulse (bpm): 92 Weight (lbs): 257 Respiratory Rate (breaths/min): 20 Body Mass Index (BMI): 40.2 Blood Pressure (mmHg): 105/64 Reference Range: 80 - 120 mg / dl Electronic Signature(s) Signed: 11/07/2020 6:11:26 PM By: Sarah Pilling RN, BSN Entered By: [name] on 11/07/2020 14:11:35

## 2020-11-14 ENCOUNTER — Encounter (HOSPITAL_BASED_OUTPATIENT_CLINIC_OR_DEPARTMENT_OTHER): Payer: Medicaid Other | Admitting: Internal Medicine

## 2020-11-14 ENCOUNTER — Other Ambulatory Visit: Payer: Self-pay

## 2020-11-14 DIAGNOSIS — T8131XD Disruption of external operation (surgical) wound, not elsewhere classified, subsequent encounter: Secondary | ICD-10-CM | POA: Diagnosis not present

## 2020-11-14 NOTE — Progress Notes (Addendum)
[name], [name] ([id number]) Visit Report for 11/14/2020 Cellular or Tissue Based Product Details Patient Name: Date of Service: [name] [name] 11/14/2020 12:30 PM Medical Record Number: [id number] Patient Account Number: [id number] Date of Birth/Sex: Treating RN: February 20, 1970 (50 y.o. [name] Primary Care Provider: [name] Other Clinician: Referring Provider: Treating Provider/Extender: [name] in Treatment: 31 Cellular or Tissue Based Product Type Wound #1 Left Chest Applied to: Performed By: Physician Ricard Dillon., MD Cellular or Tissue Based Product Type: Theraskin Level of Consciousness (Pre-procedure): Awake and Alert Pre-procedure Verification/Time Out Yes - 13:10 Taken: Location: trunk / arms / legs Wound Size (sq cm): 4.4 Product Size (sq cm): 13 Waste Size (sq cm): 0 Amount of Product Applied (sq cm): 13 Instrument Used: Forceps, Scissors Lot #: [id number] Order #: 2 Expiration Date: 04/05/2023 Fenestrated: No Reconstituted: Yes Solution Type: saline Solution Amount: 10 ml Lot #: [id number] Solution Expiration Date: 09/05/2021 Secured: Yes Secured With: Steri-Strips Dressing Applied: Yes Primary Dressing: adaptic, drawtex Procedural Pain: 0 Post Procedural Pain: 0 Response to Treatment: Procedure was tolerated well Level of Consciousness (Post- Awake and Alert procedure): Post Procedure Diagnosis Same as Pre-procedure Electronic Signature(s) Signed: 11/14/2020 5:10:51 PM By: [name] MD Entered By: [name] on 11/14/2020 13:36:34 -------------------------------------------------------------------------------- HPI Details Patient Name: Date of Service: [name] [name] 11/14/2020 12:30 PM Medical Record Number: [id number] Patient Account Number: [id number] Date of Birth/Sex: Treating RN: 17-Dec-1970 (50 y.o. [name] Primary Care Provider: [name] Other  Clinician: Referring Provider: Treating Provider/Extender: [name] in Treatment: 31 History of Present Illness HPI Description: After canADMISSION 04/09/2020 [name] 50 year old woman who is history of breast cancer in the left breast goes back to 2018. He was found to have Sarah mass in her left breast. [name] and then subsequent chemo that she did not tolerate very well. She had seed implants placed but then underwent external beam Sarah radiation in the spring 2019. 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 Her adjunctive radiation was from 03/11/2017 through 04/21/2017. Unfortunately she had Sarah left breast biopsy on 11/24/2019 and it was again positive for an invasive ductal carcinoma grade 3 he is to resume at receptor 80% positive. HER-2 negative progesterone negative. She opted for bilateral mastectomies which were done on 01/09/2020 on the right with no evidence of malignancy on the left stage IIb invasive ductal carcinoma. She again received chemotherapy but she did not tolerate it because of wound dehiscence and low platelet counts according to the patient. Unfortunately her mastectomy wound dehisced on the left breast. She was taken back to the OR by [name] of general surgery on 33/24/22 and she underwent Sarah debridement and subsequent attempt at closure. She had been previously treated with Augmentin for suspected underlying infection. Once again the wound has dehisced in the mid aspect. It is roughly 1 cm deep there is tunnels both medially and laterally she. She still has sutures in place. Still describes constant pain drainage. She also has Sarah drain still in place that drains about 10 cc/day. Currently she is changing ABD pad several times Sarah day 4/12; patient went back to see [name] who  remove most of the stitches and debrided her wound. She has been using silver alginate culture  I did last time which was Sarah PCR culture showed Peptostreptococcus, Enterococcus faecalis and Staphylococcus epidermidis which is of questionable significance. [name] that the sutures have been removed. There are undermining areas superior medial superior lateral and Sarah deep tunneling area in the inferior part of the wound. There is no obvious purulence. I started her on Augmentin on 04/12/2020 and I will likely extend this for another week AMAZINGLY; she is already been approved for HBO and will start today. [name] Echocardiogram which I reviewed from 2019 showed Sarah normal ejection fraction.. I went over side effects of HBO with the patient and answered any of her questions. Her husband was present 5/3; [name]. She went to see [name] about 2-1/2 weeks ago according to the patient he can remove some of the surrounding debris. She will see him again later this week or early next week. She has been using silver alginate. Sarah lot of this is problematic since she has Medicaid which traditionally does not cover wound care supplies although it traditionally does not cover HBO either Her wound actually looks quite Sarah bit better than the last time I saw this better looking surface healthy looking tissue. She has tunnels at 810 2 in 6 the deepest of these is at 2 at 2.2 cm there is no exposed bone. 5/10; the patient was coming in for nurse visit today but requested that I see the wound because of surface discoloration. She states the Iodoflex makes the surface of this turned "white". She is not really complaining of undue tenderness. She is changing this every second day. The goal of this had been to get the surface of the wound to Sarah vibrant red color and then potentially look at changing to Sarah silver collagen-based dressing, Puraply, and  then perhaps an alternative skin substituteo Derma vest. The patient tells me she has an appoint with her oncologist [name] on the 19th. She apparently think she is going to get another PET scan. Based on this I would like his comment about whether there is possibility of any malignancy in the wound itself or just underneath it. Up to now we have not really thought that was true 5/17; nice improvement in the chest wound. Less depth. We have been using Iodoflex to clean off the wound surface this seems to have done nicely. Patient went to see [name] 2 days ago who has referred her to see Dr. Claudia Desanctis. I certainly agree with the thought the surface of the wound looks Sarah lot better now and I think consideration of plastic surgery is certainly something the patient should discuss. I have talked to the patient about this today and I certainly support Sarah conversation with Dr. Claudia Desanctis. I also wondered about is there any residual cancer in this. [name] apparently did not seem to think so per the patient although I do not see that in the text conversations we have had. I also wonder about Sarah skin substitute if her version of Medicare [[employer]] would cover this. [name] is apparently considering starting her on [place]. Patient is already very scared about the side effects but she will discuss this with [name] on 5/19 5/31; patient's wound about the same in terms of size she still has the tunneling area to the left. Surface of the wound looks Sarah lot better there has been some filling in the granulation I have been  included on serial text between Dr. Callie Fielding surgery] Dr. Ruffin Frederick oncology] and Dr. Para Skeans surgery]. I think there is not Sarah lot of thought that the patient has recurrence of her cancer her MRI was indeterminant but [name] did not feel that there was any reason to think she has Sarah major recurrence and he was not in favor of doing Sarah dedicated breast  MRI. Dr. Claudia Desanctis wants to go ahead with Sarah flap closure I did it. I have not reviewed his note but per the patient [name] latissimus dorsi myocutaneous flap or I think Sarah skin flap from her abdomen. I have talked to the patient I think it is time to attempt this. If I can continue hyperbarics surrounding the flap then we will attempt to do this 6/14; patient using Prisma as the primary dressing here. She has completed hyperbarics and did very well. Her wound is responded nicely. The depth of this and the condition of the granulation tissue looks Sarah lot better. She has undermining areas at 2 and 10:00 both of these seem to be better as well. She is scheduled for flap closure on 7/11 by Dr. Claudia Desanctis 6/29; the patient continues to use Prisma wet-to-dry. Her graft is scheduled for 7/11 by Dr. Claudia Desanctis. We are looking at getting her additional hyperbaric treatment which I think might help with the likelihood of graft survival in this difficult irradiated area 7/21; patient returns to clinic to reinitiate HBO after her surgical procedure by Dr. Claudia Desanctis. On 07/15/2020 she had debridement of left chest wound and Sarah rotational flap closure. She also had excision of right excess axillary tissue. Pathology did not show any malignancy and follow-up Dr. Claudia Desanctis saw the patient yesterday. He noted Sarah 2 cm area of wound dehiscence. Recommended she return for hyperbaric oxygen and she returns today for her first treatment. Follow-up with him in 2 weeks. We were not actually going to see the patient today her appointment to be evaluated was Monday however she was concerned about Sarah "fishy odor". She is also noted some drainage. Not specifically dressing in the wound area but covering with Adaptic 7/25; I am seeing this wound and follow-up. So far her culture is polymicrobial not growing any specific anything specific nevertheless with the Augmentin and the silver alginate her odor is Sarah lot better. Not as much periwound tenderness.  Dimensions measuring 2.7 x 3.3 x 1.6. She has undermining from 10-1 o'clock of about 1 and half centimeters. Necrotic fat on the surface of this... 08/13/2020 upon evaluation today patient appears to be doing decently well in regard to her wound all things considered. Obviously this is much more open than what was hoped for based on the fact that she had Sarah flap performed which was supposed to completely close this wound. Nonetheless currently she tells me that she is not really having any significant pain fortunately the drainage is improved compared to last time there is still some evidence of odor as far as the drainage is concerned but is definitely not as purulent as what was previously noted. With that being said I do believe that there are quite Sarah few sutures which are quite embedded. Nonetheless I believe this needs to be removed. In fact if they are not I am afraid this is again actually grow into the skin and that is can to be even bigger issue. Some of them even so at this point appeared that there could be difficult to even get out.  8/17; this is the first time I am seeing this in about 3 weeks. The patient had stop by my office yesterday to discuss after completing hyperbaric therapy. Based on what she said I was expecting the worst today however apparently with dressing changes and perhaps debridement last week the surface of the wound actually looks better than what I was expecting. There is considerable depth although the last time I saw this the base of the wound was necrotic there is still some necrotic tissue on the surface of the wound bed however in general the rest the tissue looks healthy even with some bleeding. She has been using cleaning the wound with Dakin's and then applying Prisma and then backing Dakin's wet-to-dry. This seems to work to clean the wound surface up. They also ordered Sarah wound VAC and I discussed this with her in depth today She is not systemically unwell. She  is not running Sarah fever there is no odor. She talks about pain over the lower part of the flap to touch. This does not appear to be related to movement of her shoulder. 8/23; the patient has been using the wound VAC for 1 week. Already Sarah remarkable improvement and she appears to be tolerating this well we had Santyl under the wound VAC however the surface looks so clean today I am doubtful that is going to be necessary She is also being treated with HBO. This is helped her dramatically. Apparently her current certification will be up on 8/31. She is only had 19 dives and we will certainly need to recertify her up for this. Since I saw her last week I put in Sarah secure text message to Dr. Claudia Desanctis through epic to let him know that we were going ahead with Sarah wound VAC. He did not have any objections to this. He is offering her more surgery however in the eventuality that this does not close 8/30; the patient missed 2 treatments of HBO last week because of nausea. I think she is now on Compazine which seems to have helped. Otherwise she is tolerating HBO well. We are collagen under wound VAC. Direct depth of wound measured at 1.2 cm which I think is probably unchanged however the undermining area is down to 0.5 which I think is an improvement 9/6; patient continues with chemotherapy although she says she is troll of her nausea. We are using collagen with Sarah wound VAC on the remaining wound on her chest. No major improvements in the undermining area from 12-5 currently measuring 0.5 cm. This is no better than last week 9/13; patient continuing with chemotherapy. She completes HBO today. We are using silver collagen under wound VAC. No major improvements however the undermining area seems somewhat better to me. 9/20; we are using silver collagen under the wound VAC and although about 60% of the wound looks healthy. At the base of the wound there is still Sarah very adherent slough. She has been approved for  TheraSkin which I would like to use if we can get the wound bed in Sarah cleaner situation. 9/27; we are using Santyl under the wound VAC. Dimensions are slightly better including depth of 0.7 cm. Slight improvement in length and width the undermining area currently at 12-2 done in itself and improvement measures 0.6 which is not too much of improvement in terms of the probable depth 10/4; using Santyl under the wound VAC. Comes in today with Sarah fairly dramatic improvement in surface area and marked improvement in  the undermining area as well as condition of the wound bed. This is gratifying to see in view of this were going to continue the Santyl under the [facility] for another week. 10/11; patient presents for follow-up. She reports continued wound healing with Santyl and wound VAC. She has no issues or complaints today. She denies signs of infection. 10/17; although the wound appears to have contracted the measurements are not that much different and more problematically not much improvement in the depth and condition of the wound bed. She still has Sarah shallow undermining area perhaps it 0.3 that its been somewhat improved. She is using using Santyl under the wound VAC in preparation for Sarah trial of TheraSkin 10/27; wound looked quite good today. We went ahead with TheraSkin #1 today. The wound VAC can be discontinued 11/10; the TheraSkin was very adherent today. Very difficult to remove. Nevertheless the underlying tissue looked very healthy. Minimal undermining. TheraSkin #2 applied Electrical engineer) Signed: 11/14/2020 5:10:51 PM By: [name] MD Entered By: [name] on 11/14/2020 13:35:04 -------------------------------------------------------------------------------- Physical Exam Details Patient Name: Date of Service: [name], LO Khan [name] 11/14/2020 12:30 PM Medical Record Number: [id number] Patient Account Number: [id number] Date of Birth/Sex: Treating RN: [birth year] (50  y.o. [name] Primary Care Provider: [name] Other Clinician: Referring Provider: Treating Provider/Extender: [name] in Treatment: 31 Constitutional Sitting or standing Blood Pressure is within target range for patient.. Pulse regular and within target range for patient.Marland Kitchen Respirations regular, non-labored and within target range.. Temperature is normal and within the target range for the patient.Marland Kitchen Appears in no distress. Notes Wound exam; much better looking surface. Healthy granulation. TheraSkin #2 applied in the standard fashion Electronic Signature(s) Signed: 11/14/2020 5:10:51 PM By: [name] MD Entered By: [name] on 11/14/2020 13:35:44 -------------------------------------------------------------------------------- Physician Orders Details Patient Name: Date of Service: [name], LO Khan [name] 11/14/2020 12:30 PM Medical Record Number: [id number] Patient Account Number: [id number] Date of Birth/Sex: Treating RN: Apr 29, 1970 (50 y.o. 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 - plan theraskin #3 with Margarita Grizzle Return Sarah Nurse Visit: - 1 week post theraskin Cellular or Tissue Based Products Cellular or Tissue Based Product Type: - Theraskin #2 daptic or Mepitel. (DO NOT REMOVE). - may Cellular or Tissue Based Product applied to wound bed, secured with steri-strips,  cover with Sarah replace secondary gauze as needed for drainage Bathing/ Shower/ Hygiene May shower with protection but do not get wound dressing(s) wet. Negative Presssure Wound Therapy Wound Vac to wound continuously at 181m/hg pressure - discontinue VAC Wound Treatment Wound #1 - Chest Wound Laterality: Left Cleanser: Wound Cleanser 2 x Per Week/15 Days Discharge Instructions: Cleanse the wound with wound cleanser prior to applying Sarah clean dressing using gauze sponges, not tissue or cotton balls. Prim Dressing: TheraSkin ary 2 x Per Week/15 Days Secondary Dressing: Woven Gauze Sponge, Non-Sterile 4x4 in 2 x Per Week/15 Days Discharge Instructions: Apply over primary dressing as directed. Secondary Dressing: Drawtex 4x4 in 2 x Per Week/15 Days Discharge Instructions:  Apply over primary dressing as directed. Secondary Dressing: CarboFLEX Odor Control Dressing, 4x4 in 2 x Per Week/15 Days Discharge Instructions: Apply over primary dressing as directed. Secondary Dressing: ADAPTIC TOUCH 3x4.25 in 2 x Per Week/15 Days Discharge Instructions: Apply over primary dressing as directed. Secured With: Tegaderm Transparent Film Dressing 4x4.75 (in/in) 2 x Per Week/15 Days Discharge Instructions: Secure dressing with Tegaderm as directed. Electronic Signature(s) Signed: 11/14/2020 5:10:51 PM By: [name] MD Signed: 11/14/2020 5:26:52 PM By: Baruch Gouty RN, BSN Entered By: Baruch Gouty on 11/14/2020 13:19:18 -------------------------------------------------------------------------------- Problem List Details Patient Name: Date of Service: [name] [name] 11/14/2020 12:30 PM Medical Record Number: [id number] Patient Account Number: [id number] Date of Birth/Sex: Treating RN: 1970/03/26 (50 y.o. 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 11/14/2020 12:30 PM Medical Record Number: [id number] Patient Account Number: [id number] Date of Birth/Sex: Treating RN: 1970-07-14 (50 y.o. [name] Primary Care Provider: [name] Other Clinician: Referring Provider: Treating Provider/Extender: [name] in Treatment: 31 Subjective History of Present Illness (HPI) After canADMISSION 04/09/2020 [name] 50 year old woman who is history of breast cancer in the left breast goes back to 2018. He was found to have Sarah mass in her left breast. [name] and then subsequent chemo that she did not tolerate very well. She had seed implants placed but then underwent external beam Sarah radiation in the spring 2019. 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 Her adjunctive radiation was from 03/11/2017 through 04/21/2017. Unfortunately she had Sarah left breast biopsy on 11/24/2019 and it was again positive for an invasive ductal carcinoma grade 3 he is to resume at receptor 80% positive. HER-2  negative progesterone negative. She opted for bilateral mastectomies which were done on 01/09/2020 on the right with no evidence of malignancy on the left stage IIb invasive ductal carcinoma. She again received chemotherapy but she did not tolerate it because of wound dehiscence and low platelet counts according to the patient. Unfortunately her mastectomy wound dehisced on the left breast. She was taken back to the OR by [name] of general surgery on 33/24/22 and she underwent Sarah debridement and subsequent attempt at closure. She had been previously treated with Augmentin for suspected underlying infection. Once again the wound has dehisced in the mid aspect. It is roughly 1 cm deep there is tunnels both medially and laterally she. She still has sutures in place. Still describes constant pain drainage. She also has Sarah drain still in place that drains about 10 cc/day. Currently she is changing ABD pad several times Sarah day 4/12; patient went back to see [name] who remove most of the  stitches and debrided her wound. She has been using silver alginate culture I did last time which was Sarah PCR culture showed Peptostreptococcus, Enterococcus faecalis and Staphylococcus epidermidis which is of questionable significance. [name] that the sutures have been removed. There are undermining areas superior medial superior lateral and Sarah deep tunneling area in the inferior part of the wound. There is no obvious purulence. I started her on Augmentin on 04/12/2020 and I will likely extend this for another week AMAZINGLY; she is already been approved for HBO and will start today. [name] Echocardiogram which I reviewed from 2019 showed Sarah normal ejection fraction.. I went over side effects of HBO with the patient and answered any of her questions. Her husband was present 5/3; [name]. She went to see [name] about 2-1/2 weeks ago  according to the patient he can remove some of the surrounding debris. She will see him again later this week or early next week. She has been using silver alginate. Sarah lot of this is problematic since she has Medicaid which traditionally does not cover wound care supplies although it traditionally does not cover HBO either Her wound actually looks quite Sarah bit better than the last time I saw this better looking surface healthy looking tissue. She has tunnels at 810 2 in 6 the deepest of these is at 2 at 2.2 cm there is no exposed bone. 5/10; the patient was coming in for nurse visit today but requested that I see the wound because of surface discoloration. She states the Iodoflex makes the surface of this turned "white". She is not really complaining of undue tenderness. She is changing this every second day. The goal of this had been to get the surface of the wound to Sarah vibrant red color and then potentially look at changing to Sarah silver collagen-based dressing, Puraply, and then perhaps an alternative skin substituteo Derma vest. The patient tells me she has an appoint with her oncologist [name] on the 19th. She apparently think she is going to get another PET scan. Based on this I would like his comment about whether there is possibility of any malignancy in the wound itself or just underneath it. Up to now we have not really thought that was true 5/17; nice improvement in the chest wound. Less depth. We have been using Iodoflex to clean off the wound surface this seems to have done nicely. Patient went to see [name] 2 days ago who has referred her to see Dr. Claudia Desanctis. I certainly agree with the thought the surface of the wound looks Sarah lot better now and I think consideration of plastic surgery is certainly something the patient should discuss. I have talked to the patient about this today and I certainly support Sarah conversation with Dr. Claudia Desanctis. I also wondered about is there any residual  cancer in this. [name] apparently did not seem to think so per the patient although I do not see that in the text conversations we have had. I also wonder about Sarah skin substitute if her version of Medicare [[employer]] would cover this. [name] is apparently considering starting her on [place]. Patient is already very scared about the side effects but she will discuss this with [name] on 5/19 5/31; patient's wound about the same in terms of size she still has the tunneling area to the left. Surface of the wound looks Sarah  lot better there has been some filling in the granulation I have been included on serial text between Dr. Callie Fielding surgery] Dr. Ruffin Frederick oncology] and Dr. Para Skeans surgery]. I think there is not Sarah lot of thought that the patient has recurrence of her cancer her MRI was indeterminant but [name] did not feel that there was any reason to think she has Sarah major recurrence and he was not in favor of doing Sarah dedicated breast MRI. Dr. Claudia Desanctis wants to go ahead with Sarah flap closure I did it. I have not reviewed his note but per the patient [name] latissimus dorsi myocutaneous flap or I think Sarah skin flap from her abdomen. I have talked to the patient I think it is time to attempt this. If I can continue hyperbarics surrounding the flap then we will attempt to do this 6/14; patient using Prisma as the primary dressing here. She has completed hyperbarics and did very well. Her wound is responded nicely. The depth of this and the condition of the granulation tissue looks Sarah lot better. She has undermining areas at 2 and 10:00 both of these seem to be better as well. She is scheduled for flap closure on 7/11 by Dr. Claudia Desanctis 6/29; the patient continues to use Prisma wet-to-dry. Her graft is scheduled for 7/11 by Dr. Claudia Desanctis. We are looking at getting her additional hyperbaric treatment which I think might help with the likelihood of graft survival in this difficult  irradiated area 7/21; patient returns to clinic to reinitiate HBO after her surgical procedure by Dr. Claudia Desanctis. On 07/15/2020 she had debridement of left chest wound and Sarah rotational flap closure. She also had excision of right excess axillary tissue. Pathology did not show any malignancy and follow-up Dr. Claudia Desanctis saw the patient yesterday. He noted Sarah 2 cm area of wound dehiscence. Recommended she return for hyperbaric oxygen and she returns today for her first treatment. Follow-up with him in 2 weeks. We were not actually going to see the patient today her appointment to be evaluated was Monday however she was concerned about Sarah "fishy odor". She is also noted some drainage. Not specifically dressing in the wound area but covering with Adaptic 7/25; I am seeing this wound and follow-up. So far her culture is polymicrobial not growing any specific anything specific nevertheless with the Augmentin and the silver alginate her odor is Sarah lot better. Not as much periwound tenderness. Dimensions measuring 2.7 x 3.3 x 1.6. She has undermining from 10-1 o'clock of about 1 and half centimeters. Necrotic fat on the surface of this... 08/13/2020 upon evaluation today patient appears to be doing decently well in regard to her wound all things considered. Obviously this is much more open than what was hoped for based on the fact that she had Sarah flap performed which was supposed to completely close this wound. Nonetheless currently she tells me that she is not really having any significant pain fortunately the drainage is improved compared to last time there is still some evidence of odor as far as the drainage is concerned but is definitely not as purulent as what was previously noted. With that being said I do believe that there are quite Sarah few sutures which are quite embedded. Nonetheless I believe this needs to be removed. In fact if they are not I am afraid this is again actually grow into the skin and that is can to be  even bigger issue. Some of them even so  at this point appeared that there could be difficult to even get out. 8/17; this is the first time I am seeing this in about 3 weeks. The patient had stop by my office yesterday to discuss after completing hyperbaric therapy. Based on what she said I was expecting the worst today however apparently with dressing changes and perhaps debridement last week the surface of the wound actually looks better than what I was expecting. There is considerable depth although the last time I saw this the base of the wound was necrotic there is still some necrotic tissue on the surface of the wound bed however in general the rest the tissue looks healthy even with some bleeding. She has been using cleaning the wound with Dakin's and then applying Prisma and then backing Dakin's wet-to-dry. This seems to work to clean the wound surface up. They also ordered Sarah wound VAC and I discussed this with her in depth today She is not systemically unwell. She is not running Sarah fever there is no odor. She talks about pain over the lower part of the flap to touch. This does not appear to be related to movement of her shoulder. 8/23; the patient has been using the wound VAC for 1 week. Already Sarah remarkable improvement and she appears to be tolerating this well we had Santyl under the wound VAC however the surface looks so clean today I am doubtful that is going to be necessary She is also being treated with HBO. This is helped her dramatically. Apparently her current certification will be up on 8/31. She is only had 19 dives and we will certainly need to recertify her up for this. Since I saw her last week I put in Sarah secure text message to Dr. Claudia Desanctis through epic to let him know that we were going ahead with Sarah wound VAC. He did not have any objections to this. He is offering her more surgery however in the eventuality that this does not close 8/30; the patient missed 2 treatments of HBO last  week because of nausea. I think she is now on Compazine which seems to have helped. Otherwise she is tolerating HBO well. We are collagen under wound VAC. Direct depth of wound measured at 1.2 cm which I think is probably unchanged however the undermining area is down to 0.5 which I think is an improvement 9/6; patient continues with chemotherapy although she says she is troll of her nausea. We are using collagen with Sarah wound VAC on the remaining wound on her chest. No major improvements in the undermining area from 12-5 currently measuring 0.5 cm. This is no better than last week 9/13; patient continuing with chemotherapy. She completes HBO today. We are using silver collagen under wound VAC. No major improvements however the undermining area seems somewhat better to me. 9/20; we are using silver collagen under the wound VAC and although about 60% of the wound looks healthy. At the base of the wound there is still Sarah very adherent slough. She has been approved for TheraSkin which I would like to use if we can get the wound bed in Sarah cleaner situation. 9/27; we are using Santyl under the wound VAC. Dimensions are slightly better including depth of 0.7 cm. Slight improvement in length and width the undermining area currently at 12-2 done in itself and improvement measures 0.6 which is not too much of improvement in terms of the probable depth 10/4; using Santyl under the wound VAC. Comes in today  with Sarah fairly dramatic improvement in surface area and marked improvement in the undermining area as well as condition of the wound bed. This is gratifying to see in view of this were going to continue the Santyl under the [facility] for another week. 10/11; patient presents for follow-up. She reports continued wound healing with Santyl and wound VAC. She has no issues or complaints today. She denies signs of infection. 10/17; although the wound appears to have contracted the measurements are not that much different  and more problematically not much improvement in the depth and condition of the wound bed. She still has Sarah shallow undermining area perhaps it 0.3 that its been somewhat improved. She is using using Santyl under the wound VAC in preparation for Sarah trial of TheraSkin 10/27; wound looked quite good today. We went ahead with TheraSkin #1 today. The wound VAC can be discontinued 11/10; the TheraSkin was very adherent today. Very difficult to remove. Nevertheless the underlying tissue looked very healthy. Minimal undermining. TheraSkin #2 applied today Objective Constitutional Sitting or standing Blood Pressure is within target range for patient.. Pulse regular and within target range for patient.Marland Kitchen Respirations regular, non-labored and within target range.. Temperature is normal and within the target range for the patient.Marland Kitchen Appears in no distress. Vitals Time Taken: 12:47 PM, Height: 67 in, Source: Stated, Weight: 257 lbs, Source: Stated, BMI: 40.2, Temperature: 98.3 F, Pulse: 92 bpm, Respiratory Rate: 18 breaths/min, Blood Pressure: 117/80 mmHg. General Notes: Wound exam; much better looking surface. Healthy granulation. TheraSkin #2 applied in the standard fashion Integumentary (Hair, Skin) Wound #1 status is Open. Original cause of wound was Surgical Injury. The date acquired was: 01/16/2020. The wound has been in treatment 31 weeks. The wound is located on the Left Chest. The wound measures 2cm length x 2.2cm width x 0.2cm depth; 3.456cm^2 area and 0.691cm^3 volume. There is Fat Layer (Subcutaneous Tissue) exposed. There is no tunneling or undermining noted. There is Sarah medium amount of serosanguineous drainage noted. The wound margin is distinct with the outline attached to the wound base. There is medium (34-66%) red granulation within the wound bed. There is Sarah medium (34-66%) amount of necrotic tissue within the wound bed including Adherent Slough. 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 Sarah skin graft procedure using Sarah bioengineered skin substitute/cellular or tissue based product was performed by Ricard Dillon., MD with the following instrument(s): Forceps and Scissors. Theraskin was applied and secured with Steri-Strips. 13 sq cm of product was utilized and 0 sq cm was wasted. Post Application, adaptic, drawtex was applied. Sarah Time Out was conducted at 13:10, prior to the start of the procedure. The procedure was tolerated well with Sarah pain level of 0 throughout and Sarah pain level of 0 following the procedure. Post procedure Diagnosis Wound #1: Same as Pre-Procedure . Plan Follow-up Appointments: Return Appointment in 2 weeks. - plan theraskin #3 with Margarita Grizzle Nurse Visit: - 1 week post theraskin Cellular or Tissue Based Products: Cellular or Tissue Based Product Type: - Theraskin #2 Cellular or Tissue Based Product applied to wound bed, secured with steri-strips, cover with Adaptic or Mepitel. (DO NOT REMOVE). - may replace secondary gauze as needed  for drainage Bathing/ Shower/ Hygiene: May shower with protection but do not get wound dressing(s) wet. Negative Presssure Wound Therapy: Wound Vac to wound continuously at 158m/hg pressure - discontinue VAC WOUND #1: - Chest Wound Laterality: Left Cleanser: Wound Cleanser 2 x Per Week/15 Days Discharge Instructions: Cleanse the wound with wound cleanser prior to applying Sarah clean dressing using gauze sponges, not tissue or cotton balls. Prim Dressing: TheraSkin 2 x Per Week/15 Days ary Secondary Dressing: Woven Gauze Sponge, Non-Sterile 4x4 in 2 x Per Week/15  Days Discharge Instructions: Apply over primary dressing as directed. Secondary Dressing: Drawtex 4x4 in 2 x Per Week/15 Days Discharge Instructions: Apply over primary dressing as directed. Secondary Dressing: CarboFLEX Odor Control Dressing, 4x4 in 2 x Per Week/15 Days Discharge Instructions: Apply over primary dressing as directed. Secondary Dressing: ADAPTIC TOUCH 3x4.25 in 2 x Per Week/15 Days Discharge Instructions: Apply over primary dressing as directed. Secured With: Tegaderm Transparent Film Dressing 4x4.75 (in/in) 2 x Per Week/15 Days Discharge Instructions: Secure dressing with Tegaderm as directed. 1. TheraSkin #2 2 follow-up in 2 weeks Electronic Signature(s) Signed: 11/14/2020 5:10:51 PM By: RLinton HamMD Entered By: RLinton Hamon 11/14/2020 13:36:14 -------------------------------------------------------------------------------- SuperBill Details Patient Name: Date of Service: ARoyetta CrochetV [name] 11/14/2020 Medical Record Number: [id number] Account Number: [id number] of Birth/Sex: Treating RN: [id number].o. 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 Facility Procedures CPT4 Code: [id number]: [id number] Theraskin per 1sq cm small -13 sq cm Modifier: Quantity: 13 CPT4 Code: [id number]: 127253- SKIN SUB GRAFT TRNK/ARM/LEG ICD-10 Diagnosis Description T81.31XD Disruption of external operation (surgical) wound, not elsewhere classified,  subs Modifier: equent encounter Quantity: 1 Physician Procedures : CPT4 Code Description Modifier [id number] PHYS SKIN SUB GRAFT TRNK/ARM/LEG ICD-10 Diagnosis Description T81.31XD Disruption of external operation (surgical) wound, not elsewhere classified, subsequent encounter Quantity: 1 Electronic Signature(s) Signed: 12/04/2020 4:53:46 PM By: RLinton HamMD Signed: 12/05/2020 5:08:19 PM By: BBaruch GoutyRN, BSN Previous Signature: 11/14/2020 5:10:51 PM Version By: RLinton HamMD Entered By: BBaruch Goutyon 11/26/2020 13:49:57

## 2020-11-14 NOTE — Progress Notes (Signed)
[name], MCLAINE ([id number]) Visit Report for 11/14/2020 Arrival Information Details Patient Name: Date of Service: [name] [name] 11/14/2020 12:30 PM Medical Record Number: [id number] Patient Account Number: [id number] Date of Birth/Sex: Treating RN: 05-19-1970 (50 y.o. [name] Primary Care Lakiya Cottam: [name] Other Clinician: Referring Britt Petroni: Treating Mendy Lapinsky/Extender: [name] in Treatment: 57 Visit Information History Since Last Visit Added or deleted any medications: No Patient Arrived: Ambulatory Any new allergies or adverse reactions: No Arrival Time: 12:44 Had Sarah fall or experienced change in No Accompanied By: self activities of daily living that may affect Transfer Assistance: None risk of falls: Patient Identification Verified: Yes Signs or symptoms of abuse/neglect since last visito No Secondary Verification Process Completed: Yes Hospitalized since last visit: No Patient Requires Transmission-Based Precautions: No Implantable device outside of the clinic excluding No Patient Has Alerts: No cellular tissue based products placed in the center since last visit: Has Dressing in Place as Prescribed: Yes Pain Present Now: Yes Electronic Signature(s) Signed: 11/14/2020 5:26:52 PM By: Baruch Gouty RN, BSN Entered By: Baruch Gouty on 11/14/2020 12:47:25 -------------------------------------------------------------------------------- Encounter Discharge Information Details Patient Name: Date of Service: [name] V [name] 11/14/2020 12:30 PM Medical Record Number: [id number] Patient Account Number: [id number] Date of Birth/Sex: Treating RN: [birth year] (50 y.o. [name] Primary Care Ajmal Kathan: [name] Other Clinician: Referring Dorianna Mckiver: Treating Kyden Potash/Extender: [name] in Treatment: 31 Encounter Discharge Information Items Post Procedure  Vitals Discharge Condition: Stable Temperature (F): 98.3 Ambulatory Status: Ambulatory Pulse (bpm): 92 Discharge Destination: Home Respiratory Rate (breaths/min): 18 Transportation: Private Auto Blood Pressure (mmHg): 117/80 Accompanied By: self Schedule Follow-up Appointment: Yes Clinical Summary of Care: Patient Declined Electronic Signature(s) Signed: 11/14/2020 5:26:52 PM By: Baruch Gouty RN, BSN Entered By: Baruch Gouty on 11/14/2020 13:34:38 -------------------------------------------------------------------------------- Multi Wound Chart Details Patient Name: Date of Service: [name] V [name] 11/14/2020 12:30 PM Medical Record Number: [id number] Patient Account Number: [id number] Date of Birth/Sex: Treating RN: February 04, 1970 (50 y.o. [name] Primary Care Suhailah Kwan: [name] Other Clinician: Referring Johnothan Bascomb: Treating Zayli Villafuerte/Extender: [name] in Treatment: 31 Vital Signs Height(in): 79 Pulse(bpm): 106 Weight(lbs): 86 Blood Pressure(mmHg): 117/80 Body Mass Index(BMI): 40 Temperature(F): 98.3 Respiratory Rate(breaths/min): 18 Photos: [N/Sarah:N/Sarah] Left Chest N/Sarah N/Sarah Wound Location: Surgical Injury N/Sarah N/Sarah Wounding Event: Open Surgical Wound N/Sarah N/Sarah Primary Etiology: Necrosis (Radiation) N/Sarah N/Sarah Secondary Etiology: Hypertension, Osteoarthritis, ReceivedN/Sarah N/Sarah Comorbid History: Chemotherapy, Received Radiation 01/16/2020 N/Sarah N/Sarah Date Acquired: 68 N/Sarah N/Sarah Weeks of Treatment: Open N/Sarah N/Sarah Wound Status: 2x2.2x0.2 N/Sarah N/Sarah Measurements L x W x D (cm) 3.456 N/Sarah N/Sarah Sarah (cm) : rea 0.691 N/Sarah N/Sarah Volume (cm) : 45.70% N/Sarah N/Sarah % Reduction in Area: 90.10% N/Sarah N/Sarah % Reduction in Volume: Full Thickness Without Exposed N/Sarah N/Sarah Classification: Support Structures Medium N/Sarah N/Sarah Exudate Amount: Serosanguineous N/Sarah N/Sarah Exudate Type: red, brown N/Sarah N/Sarah Exudate Color: Distinct, outline attached N/Sarah  N/Sarah Wound Margin: Medium (34-66%) N/Sarah N/Sarah Granulation Amount: Red N/Sarah N/Sarah Granulation Quality: Medium (34-66%) N/Sarah N/Sarah Necrotic Amount: Fat Layer (Subcutaneous Tissue): Yes N/Sarah N/Sarah Exposed Structures: Fascia: No Tendon: No Muscle: No Joint: No Bone: No Small (1-33%) N/Sarah N/Sarah Epithelialization: Cellular or Tissue Based Product N/Sarah N/Sarah Procedures Performed: Treatment Notes Wound #1 (Chest) Wound Laterality: Left Cleanser Wound Cleanser Discharge Instruction: Cleanse the wound with wound cleanser prior to applying Sarah clean dressing using gauze sponges, not tissue or  cotton balls. Peri-Wound Care Topical Primary Dressing TheraSkin Secondary Dressing Woven Gauze Sponge, Non-Sterile 4x4 in Discharge Instruction: Apply over primary dressing as directed. Drawtex 4x4 in Discharge Instruction: Apply over primary dressing as directed. CarboFLEX Odor Control Dressing, 4x4 in Discharge Instruction: Apply over primary dressing as directed. ADAPTIC TOUCH 3x4.25 in Discharge Instruction: Apply over primary dressing as directed. Secured With Tegaderm Transparent Film Dressing 4x4.75 (in/in) Discharge Instruction: Secure dressing with Tegaderm as directed. Compression Wrap Compression Stockings Add-Ons Electronic Signature(s) Signed: 11/14/2020 5:10:51 PM By: [name] MD Signed: 11/14/2020 5:16:08 PM By: Deon Pilling RN, BSN Entered By: [name] on 11/14/2020 13:34:17 -------------------------------------------------------------------------------- Multi-Disciplinary Care Plan Details Patient Name: Date of Service: [name] V [name] 11/14/2020 12:30 PM Medical Record Number: [id number] Patient Account Number: [id number] Date of Birth/Sex: Treating RN: July 15, 1970 (50 y.o. [name] Primary Care Lanecia Sliva: [name] Other Clinician: Referring Gladyce Mcray: Treating Cathryne Mancebo/Extender: [name] in Treatment: 31 Active  Inactive Wound/Skin Impairment Nursing Diagnoses: Impaired tissue integrity Knowledge deficit related to smoking impact on wound healing Knowledge deficit related to ulceration/compromised skin integrity Goals: Patient will demonstrate Sarah reduced rate of smoking or cessation of smoking Date Initiated: 04/09/2020 Date Inactivated: 06/18/2020 Target Resolution Date: 06/28/2020 Goal Status: Met Patient will have Sarah decrease in wound volume by X% from date: (specify in notes) Date Initiated: 04/09/2020 Date Inactivated: 07/03/2020 Target Resolution Date: 07/05/2020 Goal Status: Met Patient/caregiver will verbalize understanding of skin care regimen Date Initiated: 04/09/2020 Target Resolution Date: 11/28/2020 Goal Status: Active Ulcer/skin breakdown will have Sarah volume reduction of 30% by week 4 Date Initiated: 04/09/2020 Date Inactivated: 05/14/2020 Target Resolution Date: 05/11/2020 Unmet Reason: see wound Goal Status: Unmet meaurements. Interventions: Assess patient/caregiver ability to obtain necessary supplies Assess patient/caregiver ability to perform ulcer/skin care regimen upon admission and as needed Assess ulceration(s) every visit Provide education on smoking Notes: Electronic Signature(s) Signed: 11/14/2020 5:26:52 PM By: Baruch Gouty RN, BSN Entered By: Baruch Gouty on 11/14/2020 12:57:19 -------------------------------------------------------------------------------- Pain Assessment Details Patient Name: Date of Service: [name] V [name] 11/14/2020 12:30 PM Medical Record Number: [id number] Patient Account Number: [id number] Date of Birth/Sex: Treating RN: 09/04/1970 (50 y.o. [name] Primary Care Elisha Cooksey: [name] Other Clinician: Referring Arturo Freundlich: Treating Lataunya Ruud/Extender: [name] in Treatment: 31 Active Problems Location of Pain Severity and Description of Pain Patient Has Paino No Site Locations Rate  the pain. Current Pain Level: 0 Character of Pain Describe the Pain: Tender Pain Management and Medication Current Pain Management: Is the Current Pain Management Adequate: Adequate How does your wound impact your activities of daily livingo Sleep: No Bathing: No Appetite: No Relationship With Others: No Bladder Continence: No Emotions: No Bowel Continence: No Work: No Toileting: No Drive: No Dressing: No Hobbies: No Electronic Signature(s) Signed: 11/14/2020 5:26:52 PM By: Baruch Gouty RN, BSN Entered By: Baruch Gouty on 11/14/2020 12:56:09 -------------------------------------------------------------------------------- Patient/Caregiver Education Details Patient Name: Date of Service: [name] [name] 11/10/2022andnbsp12:30 PM Medical Record Number: [id number] Patient Account Number: [id number] Date of Birth/Gender: Treating RN: 1970-05-01 (50 y.o. [name] Primary Care Physician: [name] Other Clinician: Referring Physician: Treating Physician/Extender: [name] in Treatment: 31 Education Assessment Education Provided To: Patient Education Topics Provided Wound/Skin Impairment: Methods: Explain/Verbal Responses: Reinforcements needed, State content correctly [employer]) Signed: 11/14/2020 5:26:52 PM By: Baruch Gouty RN, BSN Entered By: Baruch Gouty on 11/14/2020 13:12:55 -------------------------------------------------------------------------------- Wound Assessment Details  Patient Name: Date of Service: [name] [name] 11/14/2020 12:30 PM Medical Record Number: [id number] Patient Account Number: [id number] Date of Birth/Sex: Treating RN: [birth year] (50 y.o. [name] Primary Care Niyonna Betsill: [name] Other Clinician: Referring Chaela Branscum: Treating Ziair Penson/Extender: [name] in Treatment: 31 Wound Status Wound Number: 1  Primary Open Surgical Wound Etiology: Wound Location: Left Chest Secondary Necrosis (Radiation) Wounding Event: Surgical Injury Etiology: Date Acquired: 01/16/2020 Wound Status: Open Weeks Of Treatment: 31 Comorbid Hypertension, Osteoarthritis, Received Chemotherapy, Clustered Wound: No History: Received Radiation Photos Wound Measurements Length: (cm) 2 Width: (cm) 2.2 Depth: (cm) 0.2 Area: (cm) 3.456 Volume: (cm) 0.691 % Reduction in Area: 45.7% % Reduction in Volume: 90.1% Epithelialization: Small (1-33%) Tunneling: No Undermining: No Wound Description Classification: Full Thickness Without Exposed Support Structures Wound Margin: Distinct, outline attached Exudate Amount: Medium Exudate Type: Serosanguineous Exudate Color: red, brown Foul Odor After Cleansing: No Slough/Fibrino Yes Wound Bed Granulation Amount: Medium (34-66%) Exposed Structure Granulation Quality: Red Fascia Exposed: No Necrotic Amount: Medium (34-66%) Fat Layer (Subcutaneous Tissue) Exposed: Yes Necrotic Quality: Adherent Slough Tendon Exposed: No Muscle Exposed: No Joint Exposed: No Bone Exposed: No Treatment Notes Wound #1 (Chest) Wound Laterality: Left Cleanser Wound Cleanser Discharge Instruction: Cleanse the wound with wound cleanser prior to applying Sarah clean dressing using gauze sponges, not tissue or cotton balls. Peri-Wound Care Topical Primary Dressing TheraSkin Secondary Dressing Woven Gauze Sponge, Non-Sterile 4x4 in Discharge Instruction: Apply over primary dressing as directed. Drawtex 4x4 in Discharge Instruction: Apply over primary dressing as directed. CarboFLEX Odor Control Dressing, 4x4 in Discharge Instruction: Apply over primary dressing as directed. ADAPTIC TOUCH 3x4.25 in Discharge Instruction: Apply over primary dressing as directed. Secured With Tegaderm Transparent Film Dressing 4x4.75 (in/in) Discharge Instruction: Secure dressing with Tegaderm as  directed. Compression Wrap Compression Stockings Add-Ons Electronic Signature(s) Signed: 11/14/2020 5:26:52 PM By: Baruch Gouty RN, BSN Entered By: Baruch Gouty on 11/14/2020 12:55:46 -------------------------------------------------------------------------------- Vitals Details Patient Name: Date of Service: [name], Vickii Chafe V [name] 11/14/2020 12:30 PM Medical Record Number: [id number] Patient Account Number: [id number] Date of Birth/Sex: Treating RN: Jan 05, 1971 (50 y.o. [name], [name] Primary Care Andon Villard: [name] Other Clinician: Referring Pasha Broad: Treating Briant Angelillo/Extender: [name] in Treatment: 31 Vital Signs Time Taken: 12:47 Temperature (F): 98.3 Height (in): 67 Pulse (bpm): 92 Source: Stated Respiratory Rate (breaths/min): 18 Weight (lbs): 257 Blood Pressure (mmHg): 117/80 Source: Stated Reference Range: 80 - 120 mg / dl Body Mass Index (BMI): 40.2 Electronic Signature(s) Signed: 11/14/2020 5:26:52 PM By: Baruch Gouty RN, BSN Entered By: Baruch Gouty on 11/14/2020 12:47:56

## 2020-11-19 ENCOUNTER — Encounter: Payer: Self-pay | Admitting: Gynecologic Oncology

## 2020-11-19 ENCOUNTER — Ambulatory Visit: Payer: Medicaid Other | Admitting: Physical Therapy

## 2020-11-20 ENCOUNTER — Inpatient Hospital Stay: Payer: Medicaid Other | Attending: Oncology | Admitting: Gynecologic Oncology

## 2020-11-20 ENCOUNTER — Encounter: Payer: Self-pay | Admitting: Gynecologic Oncology

## 2020-11-20 ENCOUNTER — Inpatient Hospital Stay: Payer: Medicaid Other

## 2020-11-20 ENCOUNTER — Other Ambulatory Visit: Payer: Self-pay

## 2020-11-20 VITALS — BP 115/85 | HR 100 | Temp 98.4°F | Resp 18 | Wt 265.1 lb

## 2020-11-20 DIAGNOSIS — Z6841 Body Mass Index (BMI) 40.0 and over, adult: Secondary | ICD-10-CM

## 2020-11-20 DIAGNOSIS — I10 Essential (primary) hypertension: Secondary | ICD-10-CM | POA: Insufficient documentation

## 2020-11-20 DIAGNOSIS — Z1509 Genetic susceptibility to other malignant neoplasm: Secondary | ICD-10-CM

## 2020-11-20 DIAGNOSIS — Z923 Personal history of irradiation: Secondary | ICD-10-CM | POA: Diagnosis not present

## 2020-11-20 DIAGNOSIS — Z9013 Acquired absence of bilateral breasts and nipples: Secondary | ICD-10-CM | POA: Diagnosis not present

## 2020-11-20 DIAGNOSIS — Z9221 Personal history of antineoplastic chemotherapy: Secondary | ICD-10-CM | POA: Diagnosis not present

## 2020-11-20 DIAGNOSIS — Z1501 Genetic susceptibility to malignant neoplasm of breast: Secondary | ICD-10-CM

## 2020-11-20 DIAGNOSIS — Z79811 Long term (current) use of aromatase inhibitors: Secondary | ICD-10-CM | POA: Diagnosis not present

## 2020-11-20 DIAGNOSIS — Z171 Estrogen receptor negative status [ER-]: Secondary | ICD-10-CM | POA: Diagnosis not present

## 2020-11-20 DIAGNOSIS — Z148 Genetic carrier of other disease: Secondary | ICD-10-CM | POA: Insufficient documentation

## 2020-11-20 DIAGNOSIS — E66813 Obesity, class 3: Secondary | ICD-10-CM

## 2020-11-20 DIAGNOSIS — C50412 Malignant neoplasm of upper-outer quadrant of left female breast: Secondary | ICD-10-CM | POA: Diagnosis present

## 2020-11-20 DIAGNOSIS — K429 Umbilical hernia without obstruction or gangrene: Secondary | ICD-10-CM

## 2020-11-20 DIAGNOSIS — C50912 Malignant neoplasm of unspecified site of left female breast: Secondary | ICD-10-CM

## 2020-11-20 LAB — COMPREHENSIVE METABOLIC PANEL
ALT: 16 U/L (ref 0–44)
AST: 19 U/L (ref 15–41)
Albumin: 4 g/dL (ref 3.5–5.0)
Alkaline Phosphatase: 89 U/L (ref 38–126)
Anion gap: 12 (ref 5–15)
BUN: 8 mg/dL (ref 6–20)
CO2: 24 mmol/L (ref 22–32)
Calcium: 9.3 mg/dL (ref 8.9–10.3)
Chloride: 103 mmol/L (ref 98–111)
Creatinine, Ser: 0.84 mg/dL (ref 0.44–1.00)
GFR, Estimated: >60 mL/min (ref >60)
Glucose, Bld: 96 mg/dL (ref 70–99)
Potassium: 3.9 mmol/L (ref 3.5–5.1)
Sodium: 139 mmol/L (ref 135–145)
Total Bilirubin: 0.7 mg/dL (ref 0.3–1.2)
Total Protein: 7.7 g/dL (ref 6.5–8.1)

## 2020-11-20 LAB — CBC WITH DIFFERENTIAL/PLATELET
Abs Immature Granulocytes: 0.02 10*3/uL (ref 0.00–0.07)
Basophils Absolute: 0 10*3/uL (ref 0.0–0.1)
Basophils Relative: 1 %
Eosinophils Absolute: 0.1 10*3/uL (ref 0.0–0.5)
Eosinophils Relative: 2 %
HCT: 35.9 % — ABNORMAL LOW (ref 36.0–46.0)
Hemoglobin: 11.9 g/dL — ABNORMAL LOW (ref 12.0–15.0)
Immature Granulocytes: 0 %
Lymphocytes Relative: 25 %
Lymphs Abs: 1.4 10*3/uL (ref 0.7–4.0)
MCH: 30.4 pg (ref 26.0–34.0)
MCHC: 33.1 g/dL (ref 30.0–36.0)
MCV: 91.6 fL (ref 80.0–100.0)
Monocytes Absolute: 0.4 10*3/uL (ref 0.1–1.0)
Monocytes Relative: 6 %
Neutro Abs: 3.6 10*3/uL (ref 1.7–7.7)
Neutrophils Relative %: 66 %
Platelets: 210 10*3/uL (ref 150–400)
RBC: 3.92 MIL/uL (ref 3.87–5.11)
RDW: 14.7 % (ref 11.5–15.5)
WBC: 5.6 10*3/uL (ref 4.0–10.5)
nRBC: 0 % (ref 0.0–0.2)

## 2020-11-20 NOTE — Progress Notes (Signed)
Gynecologic Oncology Return Clinic Visit  11/20/2020  Reason for Visit: Discussion regarding surgery in the setting of BRCA2 positive patient  Treatment History: Oncology History  Malignant neoplasm of upper-outer quadrant of left breast in female, estrogen receptor negative ([place])  06/19/2016 Initial Diagnosis   status post left breast upper outer quadrant and left axillary lymph node biopsy, both positive for a clinical T2-T3 N1, stage IIIB-C invasive ductal carcinoma, grade 3, triple negative, with an MIB-1 of 90%   07/20/2016 - 12/21/2016 Neo-Adjuvant Chemotherapy   neoadjuvant chemotherapy to consist of doxorubicin and cyclophosphamide in dose dense fashion 4 completed 09/10/2016 to be followed by paclitaxel weekly 12 given with carboplatin              (a) cycle 4 of cyclophosphamide and doxorubicin was delayed 10 days and dose decreased 10% because of febrile neutropenia after cycle 3             (b) cycle 6 of Paclitaxel and Carboplatin delayed due to neutropenia, therefore Granix added to Wednesday, Thursday, Friday following chemotherapy days   08/17/2016 Genetic Testing   BRCA2 c.8169T>A and MSH2 c.2050G>T VUS found on the common hereditary cancer panel.  The Hereditary Gene Panel offered by [facility] includes sequencing and/or deletion duplication testing of the following 46 genes: APC, ATM, AXIN2, BARD1, BMPR1A, BRCA1, BRCA2, BRIP1, CDH1, CDKN2A (p14ARF), CDKN2A (p16INK4a), CHEK2, CTNNA1, DICER1, EPCAM (Deletion/duplication testing only), GREM1 (promoter region deletion/duplication testing only), KIT, MEN1, MLH1, MSH2, MSH3, MSH6, MUTYH, NBN, NF1, NHTL1, PALB2, PDGFRA, PMS2, POLD1, POLE, PTEN, RAD50, RAD51C, RAD51D, SDHB, SDHC, SDHD, SMAD4, SMARCA4. STK11, TP53, TSC1, TSC2, and VHL.  The following genes were evaluated for sequence changes only: SDHA and HOXB13 c.251G>A variant only.  The report date is August 17, 2016.    01/27/2017 Surgery   status post left lumpectomy and left  axillary sentinel lymph node sampling with pathology showing a complete pathologic response (ypT0 ypN0); 5 left axillary lymph nodes removed      03/11/2017 - 04/21/2017 Radiation Therapy   adjuvant radiation 03/11/2017-04/21/2017  Site/dose:   1. Left breast, 2 Gy in 25 fractions for a total dose of 50 Gy                      2. Boost, 2 Gy in 5 fractions for a total dose of 10 Gy     02/19/2020 -  Chemotherapy    Patient is on Treatment Plan: BREAST ADJUVANT CMF IV Q21D       Recurrent breast cancer, left ([place])  12/16/2019 Initial Diagnosis   Recurrent breast cancer, left ([place])   02/19/2020 -  Chemotherapy    Patient is on Treatment Plan: BREAST ADJUVANT CMF IV Q21D       02/19/2020 Cancer Staging   Staging form: Breast, AJCC 8th Edition - Clinical: Stage Unknown (cT2, cNX, cM0, G2, ER+, PR-, HER2: Equivocal) - Signed by Chauncey Cruel, MD on 02/19/2020      Interval History: Since her last visit here, patient was diagnosed with a second left-sided breast cancer in November 2021.  She underwent bilateral mastectomies in January of this year that was complicated by significant issues with wound healing on the left side in the setting of prior radiation.  She completed adjuvant chemotherapy in February.  She is now on anastrozole and olaparib.  From a wound healing standpoint.  She has had 2 rounds of hyperbaric oxygen treatment and is now seeing wound care with almost complete healing of  her wound.    In terms of her lap rib, she is back on full dosing now.  She takes Compazine in the morning before her dose given some nausea that the medication causes.  After her treatments, she has developed some joint and back pain.  She denies any pelvic pain.  She has been sexually active maybe once in the last year.  She denies any vaginal bleeding or discharge.  She reports regular bowel function and takes a stool softener regularly.  She denies any urinary symptoms.  Menses stopped with  chemotherapy in 2018.  Denies any menopausal symptoms including hot flashes.  Past Medical/Surgical History: Past Medical History:  Diagnosis Date   Arthritis    Breast cancer ([place])    Left Breast Cancer   Breast cancer ([place]) 16/10/9602   left    Complication of anesthesia    DAY SURGERY 2010 OR 2011 ASPIRATED AND STAYED OVERNIGHT   History of radiation therapy 03/11/17- 04/21/17   Left breast, 2 Gy in 25 fractions for a total dose of 50 Gy, Boost, 2 Gy in 5 fractions for a total dose of 10 Gy   Hypertension    OFF MEDS  SINCE CHEMO X 5 MONTHS   Malignant neoplasm of upper-outer quadrant of left breast in female, estrogen receptor negative ([place]) 06/23/2016   Personal history of chemotherapy 2019   Left Breast Cancer  07-2016-12-2016   Personal history of radiation therapy 2019   Left Breast Cancer    Past Surgical History:  Procedure Laterality Date   ANKLE SURGERY Right    BREAST CYST EXCISION Right 07/15/2020   Procedure: Excision excess right axillary tissue;  Surgeon: Cindra Presume, MD;  Location: [place];  Service: Plastics;  Laterality: Right;   BREAST LUMPECTOMY Left 01/27/2017   BREAST LUMPECTOMY WITH RADIOACTIVE SEED AND SENTINEL LYMPH NODE BIOPSY Left 01/27/2017   Procedure: LEFT BREAST LUMPECTOMY WITH RADIOACTIVE SEED LOCALIZATION, LEFT AXILLARY DEEP LYMPH NODE BIOPSY WITH RADIOACTIVE SEED LOCALIZATION, LEFT AXILLARY SENTINEL LYMPH NODE MAPPING AND BIOPSY WITH BLUE DYE INJECTION;  Surgeon: [name], MD;  Location: [place];  Service: General;  Laterality: Left;   COLONOSCOPY WITH PROPOFOL N/A 02/01/2020   Procedure: COLONOSCOPY WITH PROPOFOL;  Surgeon: Milus Banister, MD;  Location: [facility];  Service: Endoscopy;  Laterality: N/A;   DEBRIDEMENT AND CLOSURE WOUND Left 03/28/2020   Procedure: DEBRIDEMENT AND CLOSURE OF MASTECTOMY WOUND;  Surgeon: Rolm Bookbinder, MD;  Location: [facility];  Service: General;  Laterality: Left;   DEBRIDEMENT AND CLOSURE WOUND Left  07/15/2020   Procedure: Debridement of left chest wound and rotational flap closure;  Surgeon: Cindra Presume, MD;  Location: [place];  Service: Plastics;  Laterality: Left;  1.5 hour   ENDOSCOPIC MUCOSAL RESECTION  02/01/2020   Procedure: ENDOSCOPIC MUCOSAL RESECTION;  Surgeon: Milus Banister, MD;  Location: [facility];  Service: Endoscopy;;   HEMOSTASIS CLIP PLACEMENT  02/01/2020   Procedure: HEMOSTASIS CLIP PLACEMENT;  Surgeon: Milus Banister, MD;  Location: [facility];  Service: Endoscopy;;   IR FLUORO GUIDE PORT INSERTION RIGHT  07/14/2016   IR US GUIDE VASC ACCESS RIGHT  07/14/2016   MASTECTOMY W/ SENTINEL NODE BIOPSY Bilateral 01/09/2020   Procedure: BILATERAL MASTECTOMY WITH LEFT AXILLARY SENTINEL LYMPH NODE BIOPSY;  Surgeon: Rolm Bookbinder, MD;  Location: [place];  Service: General;  Laterality: Bilateral;   PORT-A-CATH REMOVAL Right 01/27/2017   Procedure: REMOVAL PORT-A-CATH;  Surgeon: [name], MD;  Location: [place];  Service: General;  Laterality: Right;   PORTACATH PLACEMENT Right 01/09/2020   Procedure: INSERTION PORT-A-CATH;  Surgeon: Rolm Bookbinder, MD;  Location: [place];  Service: General;  Laterality: Right;   SUBMUCOSAL LIFTING INJECTION  02/01/2020   Procedure: SUBMUCOSAL LIFTING INJECTION;  Surgeon: Milus Banister, MD;  Location: [facility];  Service: Endoscopy;;   TUBAL LIGATION     WISDOM TOOTH EXTRACTION      Family History  Problem Relation Age of Onset   Diabetes Mother    Hypertension Mother    Arthritis Mother    Colon cancer Father 11       died .64 metastatic at time of diagnosis   Diabetes Father    Heart disease Father    Hypertension Father    Breast cancer Maternal Grandmother 41       d.64s   Breast cancer Maternal Aunt 52   Cervical cancer Maternal Aunt 55   Breast cancer Maternal Aunt 46       d.50s   Cancer Maternal Uncle        d.62s unspecified type of cancer   Breast cancer  Cousin 73       paternal first-cousin (daughter of unaffected aunt)   Cancer Maternal Aunt 17       "Female Cancer"   Cancer Maternal Aunt        unknown cancer   Brain cancer Daughter 82   Esophageal cancer Other    Colon polyps Neg Hx    Rectal cancer Neg Hx    Stomach cancer Neg Hx     Social History   Socioeconomic History   Marital status: Married    Spouse name: Not on file   Number of children: Not on file   Years of education: Not on file   Highest education level: Not on file  Occupational History   Not on file  Tobacco Use   Smoking status: Never   Smokeless tobacco: Never  Vaping Use   Vaping Use: Never used  Substance and Sexual Activity   Alcohol use: No   Drug use: No   Sexual activity: Not Currently  Other Topics Concern   Not on file  Social History Narrative   Not on file   Social Determinants of Health   Financial Resource Strain: Not on file  Food Insecurity: Not on file  Transportation Needs: Not on file  Physical Activity: Not on file  Stress: Not on file  Social Connections: Not on file    Current Medications:  Current Outpatient Medications:    anastrozole (ARIMIDEX) 1 MG tablet, TAKE 1 TABLET BY MOUTH DAILY., Disp: 90 tablet, Rfl: 4   gabapentin (NEURONTIN) 100 MG capsule, TAKE 2 CAPSULES BY MOUTH IN THE MORNING AND 2 CAPSULES IN THE AFTERNOON, Disp: 240 capsule, Rfl: 3   gabapentin (NEURONTIN) 300 MG capsule, Take 3 capsules (900 mg total) by mouth at bedtime., Disp: 180 capsule, Rfl: 4   lidocaine-prilocaine (EMLA) cream, APPLY TO AFFECTED AREA ONCE AS DIRECTED (Patient taking differently: Apply 1 application topically daily as needed (prior to lab draws/port flushes).), Disp: 30 g, Rfl: 3   olaparib (LYNPARZA) 150 MG tablet, Take 2 tablets (300 mg total) by mouth 2 (two) times daily. Swallow whole. May take with food to decrease nausea and vomiting.Start May 23, 2020, Disp: 120 tablet, Rfl: 6   potassium chloride SA (KLOR-CON) 20 MEQ  tablet, TAKE 1 TABLET BY MOUTH ONCE A DAY (Patient taking differently: Take 20 mEq  by mouth at bedtime.), Disp: 30 tablet, Rfl: 2   prochlorperazine (COMPAZINE) 10 MG tablet, Take 1 tablet (10 mg total) by mouth every 6 (six) hours as needed for nausea or vomiting., Disp: 30 tablet, Rfl: 0   senna-docusate (SENOKOT-S) 8.6-50 MG tablet, Take 2 tablets by mouth at bedtime., Disp: , Rfl:    traMADol (ULTRAM) 50 MG tablet, TAKE 1 TO 2 TABLETS BY MOUTH EVERY 6 HOURS AS NEEDED., Disp: 120 tablet, Rfl: 0   COLLAGEN MATRIX MESHED, PORC, EX, Apply 1 each topically every other day. Collagen Wound Packing every other day (Patient not taking: Reported on 11/19/2020), Disp: , Rfl:    sodium hypochlorite (DAKIN'S 1/2 STRENGTH) external solution, Moisten gauze with Dakin's solution then wring out leaving gauze damp not saturated before packing into the wound bed lightly as directed (Patient not taking: Reported on 11/19/2020), Disp: 473 mL, Rfl: 3  Review of Systems: Pertinent positives include joint and back pain, pain with intercourse. Denies appetite changes, fevers, chills, fatigue, unexplained weight changes. Denies hearing loss, neck lumps or masses, mouth sores, ringing in ears or voice changes. Denies cough or wheezing.  Denies shortness of breath. Denies chest pain or palpitations. Denies leg swelling. Denies abdominal distention, pain, blood in stools, constipation, diarrhea, nausea, vomiting, or early satiety. Denies dysuria, frequency, hematuria or incontinence. Denies hot flashes, pelvic pain, vaginal bleeding or vaginal discharge.   Denies muscle pain/cramps. Denies itching, rash, or wounds. Denies dizziness, headaches, numbness or seizures. Denies swollen lymph nodes or glands, denies easy bruising or bleeding. Denies anxiety, depression, confusion, or decreased concentration.  Physical Exam: BP 115/85 (BP Location: Right Arm, Patient Position: Sitting)   Pulse 100   Temp 98.4 F (36.9 C)    Resp 18   Wt 265 lb 2 oz (120.3 kg)   LMP  (LMP Unknown)   SpO2 100%   BMI 40.91 kg/m  General: Alert, oriented, no acute distress. HEENT: Normocephalic, atraumatic, sclera anicteric. Chest: Clear to auscultation bilaterally.  No wheezes or rhonchi. Cardiovascular: Regular rate and rhythm, no murmurs. Abdomen: Obese, soft, nontender.  Normoactive bowel sounds.  No masses or hepatosplenomegaly appreciated.  Palpable several centimeter sized umbilical hernia. Extremities: Grossly normal range of motion.  Warm, well perfused.  No edema bilaterally. Lymphatics: No cervical, supraclavicular, or inguinal adenopathy. GU: Normal appearing external genitalia without erythema, excoriation, or lesions.  Speculum exam reveals mildly atrophic vaginal mucosa, no lesions or masses.  Cervix normal in appearance.  Bimanual exam reveals small mobile uterus, no adnexal masses appreciated, no nodularity.    Laboratory & Radiologic Studies: CA125 a year ago was normal at 6.3  Pelvic ultrasound from 11/14/2019 showed unremarkable uterus and nonvisualization of ovaries.  Assessment & Plan: [name] is a 50 y.o. woman with BRCA2 mutation and personal history of breast cancer x2.  Patient previously saw my partner a year ago with plan for surgery. Shortly after that visit, she was diagnosed with a second breast cancer and has now undergone treatment for that, complicated by significant issues with wound healing after surgery earlier this year. She now it's almost completely healed and is on maintenance therapy with an aromatase inhibitor and PARPi.   Patient would like to think about scheduling surgery in the near future. She is interested in having her umbilical hernia repaired at the same time and would like the surgeon who performed her breast surgery to be involved. I will have my office reach out to [name] to try to coordinate a  joint surgery. As this may take several months to coordinate, I  recommended that we get an updated pelvic ultrasound and CA-125.  With regard to her risk reducing surgery, we discussed the decrease risk in ovarian and fallopian tube cancer by removal of bilateral tubes and ovaries. While the risk is significantly decreased, it does not go to zero because of the small risk of primary peritoneal cancer. In terms of concurrent hysterectomy, we discussed that this is sometimes a management tool of patients are going to be on estrogen therapy in the future. Given her second breast cancer was estrogen positive, she would not be a candidate for systemic estrogen therapy in the future. She will think about concurrent hysterectomy and let me know. At this time we will plan for robotic [facility] for risk reduction.  Last pap 06/2016 HPV negative, due 06/2021.  45 minutes of total time was spent for this patient encounter, including preparation, face-to-face counseling with the patient and coordination of care, and documentation of the encounter.  Jeral Pinch, MD  Division of Gynecologic Oncology  Department of Obstetrics and Gynecology  [facility]

## 2020-11-20 NOTE — Patient Instructions (Signed)
It was a pleasure meeting you today.  I will release her CEA-125 and pelvic ultrasound results in my chart when I have them.  I will coordinate with your general surgeon so that we can schedule a joint date to have your tubes and ovaries removed at the same time as your umbilical hernia repair.

## 2020-11-21 ENCOUNTER — Encounter (HOSPITAL_BASED_OUTPATIENT_CLINIC_OR_DEPARTMENT_OTHER): Payer: Medicaid Other | Admitting: Internal Medicine

## 2020-11-21 DIAGNOSIS — T8131XD Disruption of external operation (surgical) wound, not elsewhere classified, subsequent encounter: Secondary | ICD-10-CM | POA: Diagnosis not present

## 2020-11-21 LAB — CA 125: Cancer Antigen (CA) 125: 7.1 U/mL (ref 0.0–38.1)

## 2020-11-21 NOTE — Progress Notes (Signed)
ODALIZ, MCQUEARY ([id number]) Visit Report for 11/21/2020 Arrival Information Details Patient Name: Date of Service: [name] 11/21/2020 2:00 PM Medical Record Number: [id number] Patient Account Number: [id number] Date of Birth/Sex: Treating RN: October 04, 1970 (50 y.o. [name], [name] Primary Care Javonn Gauger: [name] Other Clinician: Referring Raliegh Scobie: Treating Kamillah Didonato/Extender: [name] in Treatment: 50 Visit Information History Since Last Visit Added or deleted any medications: No Patient Arrived: Ambulatory Any new allergies or adverse reactions: No Arrival Time: 14:40 Had a fall or experienced change in No Accompanied By: husband activities of daily living that may affect Transfer Assistance: None risk of falls: Patient Identification Verified: Yes Signs or symptoms of abuse/neglect since last visito No Secondary Verification Process Completed: Yes Hospitalized since last visit: No Patient Requires Transmission-Based Precautions: No Implantable device outside of the clinic excluding No Patient Has Alerts: No cellular tissue based products placed in the center since last visit: Has Dressing in Place as Prescribed: Yes Pain Present Now: Yes Electronic Signature(s) Signed: 11/21/2020 5:18:51 PM By: Deon Pilling RN, BSN Entered By: Deon Pilling on 11/21/2020 14:40:56 -------------------------------------------------------------------------------- Clinic Level of Care Assessment Details Patient Name: Date of Service: [name], [name] 11/21/2020 2:00 PM Medical Record Number: [id number] Patient Account Number: [id number] Date of Birth/Sex: Treating RN: [birth year] (50 y.o. [name], [name] Primary Care Sabine Tenenbaum: [name] Other Clinician: Referring Berneta Sconyers: Treating Quetzal Meany/Extender: [name] in Treatment: 70 Clinic Level of Care Assessment Items TOOL 4 Quantity Score X-  1 0 Use when only an EandM is performed on FOLLOW-UP visit ASSESSMENTS - Nursing Assessment / Reassessment X- 1 10 Reassessment of Co-morbidities (includes updates in patient status) X- 1 5 Reassessment of Adherence to Treatment Plan ASSESSMENTS - Wound and Skin A ssessment / Reassessment X - Simple Wound Assessment / Reassessment - one wound 1 5 []  - 0 Complex Wound Assessment / Reassessment - multiple wounds []  - 0 Dermatologic / Skin Assessment (not related to wound area) ASSESSMENTS - Focused Assessment []  - 0 Circumferential Edema Measurements - multi extremities []  - 0 Nutritional Assessment / Counseling / Intervention []  - 0 Lower Extremity Assessment (monofilament, tuning fork, pulses) []  - 0 Peripheral Arterial Disease Assessment (using hand held doppler) ASSESSMENTS - Ostomy and/or Continence Assessment and Care []  - 0 Incontinence Assessment and Management []  - 0 Ostomy Care Assessment and Management (repouching, etc.) PROCESS - Coordination of Care X - Simple Patient / Family Education for ongoing care 1 15 []  - 0 Complex (extensive) Patient / Family Education for ongoing care X- 1 10 Staff obtains Programmer, systems, Records, T Results / Process Orders est []  - 0 Staff telephones HHA, Nursing Homes / Clarify orders / etc []  - 0 Routine Transfer to another Facility (non-emergent condition) []  - 0 Routine Hospital Admission (non-emergent condition) X- 1 15 New Admissions / Biomedical engineer / Ordering NPWT Apligraf, etc. , []  - 0 Emergency Hospital Admission (emergent condition) X- 1 10 Simple Discharge Coordination []  - 0 Complex (extensive) Discharge Coordination PROCESS - Special Needs []  - 0 Pediatric / Minor Patient Management []  - 0 Isolation Patient Management []  - 0 Hearing / Language / Visual special needs []  - 0 Assessment of Community assistance (transportation, D/C planning, etc.) []  - 0 Additional assistance / Altered mentation []  -  0 Support Surface(s) Assessment (bed, cushion, seat, etc.) INTERVENTIONS - Wound Cleansing / Measurement X - Simple Wound Cleansing - one wound 1 5 []  -  0 Complex Wound Cleansing - multiple wounds X- 1 5 Wound Imaging (photographs - any number of wounds) []  - 0 Wound Tracing (instead of photographs) X- 1 5 Simple Wound Measurement - one wound []  - 0 Complex Wound Measurement - multiple wounds INTERVENTIONS - Wound Dressings X - Small Wound Dressing one or multiple wounds 1 10 []  - 0 Medium Wound Dressing one or multiple wounds []  - 0 Large Wound Dressing one or multiple wounds []  - 0 Application of Medications - topical []  - 0 Application of Medications - injection INTERVENTIONS - Miscellaneous []  - 0 External ear exam []  - 0 Specimen Collection (cultures, biopsies, blood, body fluids, etc.) []  - 0 Specimen(s) / Culture(s) sent or taken to Lab for analysis []  - 0 Patient Transfer (multiple staff / Civil Service fast streamer / Similar devices) []  - 0 Simple Staple / Suture removal (25 or less) []  - 0 Complex Staple / Suture removal (26 or more) []  - 0 Hypo / Hyperglycemic Management (close monitor of Blood Glucose) []  - 0 Ankle / Brachial Index (ABI) - do not check if billed separately X- 1 5 Vital Signs Has the patient been seen at the hospital within the last three years: Yes Total Score: 100 Level Of Care: New/Established - Level 3 Electronic Signature(s) Signed: 11/21/2020 5:03:29 PM By: Rhae Hammock RN Entered By: Rhae Hammock on 11/21/2020 15:26:39 -------------------------------------------------------------------------------- Encounter Discharge Information Details Patient Name: Date of Service: [name], [name] 11/21/2020 2:00 PM Medical Record Number: [id number] Patient Account Number: [id number] Date of Birth/Sex: Treating RN: [birth year] (50 y.o. [name], [name] Primary Care Raniyah Curenton: [name] Other Clinician: Referring  Armando Lauman: Treating Bettey Muraoka/Extender: [name] in Treatment: 1 Encounter Discharge Information Items Discharge Condition: Stable Ambulatory Status: Ambulatory Discharge Destination: Home Transportation: Private Auto Accompanied By: husband Schedule Follow-up Appointment: Yes Clinical Summary of Care: Patient Declined Electronic Signature(s) Signed: 11/21/2020 5:03:29 PM By: Rhae Hammock RN Entered By: Rhae Hammock on 11/21/2020 15:25:54 -------------------------------------------------------------------------------- Patient/Caregiver Education Details Patient Name: Date of Service: [name] 11/17/2022andnbsp2:00 PM Medical Record Number: [id number] Patient Account Number: [id number] Date of Birth/Gender: Treating RN: Aug 31, 1970 (50 y.o. [name], [name] Primary Care Physician: [name] Other Clinician: Referring Physician: Treating Physician/Extender: [name] in Treatment: 41 Education Assessment Education Provided To: Patient Education Topics Provided Electronic Signature(s) Signed: 11/21/2020 5:03:29 PM By: Rhae Hammock RN Entered By: Rhae Hammock on 11/21/2020 15:25:41 -------------------------------------------------------------------------------- Wound Assessment Details Patient Name: Date of Service: [name], [name] 11/21/2020 2:00 PM Medical Record Number: [id number] Patient Account Number: [id number] Date of Birth/Sex: Treating RN: 07/29/1970 (50 y.o. [name] Primary Care Tinsley Lomas: [name] Other Clinician: Referring Anacaren Kohan: Treating Juanna Pudlo/Extender: [name] in Treatment: 32 Wound Status Wound Number: 1 Primary Etiology: Open Surgical Wound Wound Location: Left Chest Secondary Etiology: Necrosis (Radiation) Wounding Event: Surgical Injury Wound Status: Open Date Acquired: 01/16/2020 Weeks Of  Treatment: 32 Clustered Wound: No Wound Measurements Length: (cm) 2 Width: (cm) 2.2 Depth: (cm) 0.2 Area: (cm) 3.456 Volume: (cm) 0.691 % Reduction in Area: 45.7% % Reduction in Volume: 90.1% Wound Description Classification: Full Thickness Without Exposed Support Structu Exudate Amount: Medium Exudate Type: Serosanguineous Exudate Color: red, brown res Treatment Notes Wound #1 (Chest) Wound Laterality: Left Cleanser Wound Cleanser Discharge Instruction: Cleanse the wound with wound cleanser prior to applying a clean dressing using gauze sponges, not tissue or cotton balls. Peri-Wound  Care Topical Primary Dressing TheraSkin Secondary Dressing Woven Gauze Sponge, Non-Sterile 4x4 in Discharge Instruction: Apply over primary dressing as directed. Drawtex 4x4 in Discharge Instruction: Apply over primary dressing as directed. CarboFLEX Odor Control Dressing, 4x4 in Discharge Instruction: Apply over primary dressing as directed. ADAPTIC TOUCH 3x4.25 in Discharge Instruction: Apply over primary dressing as directed. Secured With Tegaderm Transparent Film Dressing 4x4.75 (in/in) Discharge Instruction: Secure dressing with Tegaderm as directed. Compression Wrap Compression Stockings Add-Ons Electronic Signature(s) Signed: 11/21/2020 5:18:51 PM By: Deon Pilling RN, BSN Entered By: Deon Pilling on 11/21/2020 14:41:47 -------------------------------------------------------------------------------- Vitals Details Patient Name: Date of Service: [name], [name] 11/21/2020 2:00 PM Medical Record Number: [id number] Patient Account Number: [id number] Date of Birth/Sex: Treating RN: November 19, 1970 (50 y.o. [name], [name] Primary Care Harnoor Kohles: [name] Other Clinician: Referring Tonie Elsey: Treating Veron Senner/Extender: [name] in Treatment: 32 Vital Signs Time Taken: 14:41 Temperature (F): 98.1 Height (in): 67 Pulse (bpm):  80 Weight (lbs): 257 Respiratory Rate (breaths/min): 18 Body Mass Index (BMI): 40.2 Blood Pressure (mmHg): 109/76 Reference Range: 80 - 120 mg / dl Electronic Signature(s) Signed: 11/21/2020 5:18:51 PM By: Deon Pilling RN, BSN Entered By: Deon Pilling on 11/21/2020 14:41:37

## 2020-11-21 NOTE — Progress Notes (Signed)
[name], [name] ([id number]) Visit Report for 11/21/2020 SuperBill Details Patient Name: Date of Service: [name] [name] 11/21/2020 Medical Record Number: [id number] Patient Account Number: [id number] Date of Birth/Sex: Treating RN: [birth year] (50 y.o. 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

## 2020-11-26 ENCOUNTER — Ambulatory Visit (HOSPITAL_COMMUNITY): Admission: RE | Admit: 2020-11-26 | Payer: Medicaid Other | Source: Ambulatory Visit

## 2020-11-27 ENCOUNTER — Other Ambulatory Visit: Payer: Self-pay

## 2020-11-27 ENCOUNTER — Encounter (HOSPITAL_BASED_OUTPATIENT_CLINIC_OR_DEPARTMENT_OTHER): Payer: Medicaid Other | Admitting: Physician Assistant

## 2020-11-27 DIAGNOSIS — T8131XD Disruption of external operation (surgical) wound, not elsewhere classified, subsequent encounter: Secondary | ICD-10-CM | POA: Diagnosis not present

## 2020-11-27 NOTE — Progress Notes (Addendum)
[name], [name] ([id number]) Visit Report for 11/27/2020 Chief Complaint Document Details Patient Name: Date of Service: [name] [name] 11/27/2020 12:30 PM Medical Record Number: [id number] Patient Account Number: [id number] Date of Birth/Sex: Treating RN: 27-Sep-1970 (50 y.o. [name] Primary Care Provider: [name] Other Clinician: Referring Provider: Treating Provider/Extender: Randon Goldsmith in Treatment: 44 Information Obtained from: Patient Chief Complaint 04/09/2020; patient is here for review of an area of wound dehiscence in the left breast mastectomy scar Electronic Signature(s) Signed: 11/27/2020 1:10:39 PM By: Worthy Keeler PA-C Entered By: Worthy Keeler on 11/27/2020 13:10:39 -------------------------------------------------------------------------------- Cellular or Tissue Based Product Details Patient Name: Date of Service: Sarah BDELA Sarah L, [name] 11/27/2020 12:30 PM Medical Record Number: [id number] Patient Account Number: [id number] Date of Birth/Sex: Treating RN: 09-Nov-1970 (50 y.o. [name] Primary Care Provider: [name] Other Clinician: Referring Provider: Treating Provider/Extender: Randon Goldsmith in Treatment: 33 Cellular or Tissue Based Product Type Wound #1 Left Chest Applied to: Performed By: Physician Worthy Keeler, PA Cellular or Tissue Based Product Type: Theraskin Level of Consciousness (Pre-procedure): Awake and Alert Pre-procedure Verification/Time Out Yes - 13:20 Taken: Location: trunk / arms / legs Wound Size (sq cm): 0.96 Product Size (sq cm): 6.25 Waste Size (sq cm): 4.75 Amount of Product Applied (sq cm): 1.5 Instrument Used: Forceps, Scissors Lot #: [id number] Expiration Date: 07/30/2023 Reconstituted: Yes Solution Type: Normal Saline Solution Amount: 42m Lot #: [id number] Expiration Date: 09/05/2021 Secured: Yes Secured With:  Steri-Strips Dressing Applied: Yes Primary Dressing: Adaptic, Drawtex Response to Treatment: Procedure was tolerated well Level of Consciousness (Post- Awake and Alert procedure): Post Procedure Diagnosis Same as Pre-procedure Electronic Signature(s) Signed: 11/27/2020 4:59:20 PM By: BLorrin JacksonSigned: 11/27/2020 5:33:12 PM By: SWorthy KeelerPA-C Entered By: BLorrin Jacksonon 11/27/2020 13:26:21 -------------------------------------------------------------------------------- HPI Details Patient Name: Date of Service: ARoyetta CrochetV [name] 11/27/2020 12:30 PM Medical Record Number: [id number] Account Number: [id number] of Birth/Sex: Treating RN: [birth year](50y.o. FElam DutchPrimary Care Provider: JKarle PlumberOther Clinician: Referring Provider: Treating Provider/Extender: SRandon Goldsmithin Treatment: 33 History of Present Illness HPI Description: After canADMISSION 04/09/2020 [name] 50year old woman who is history of breast cancer in the left breast goes back to 2018. He was found to have Sarah mass in her left breast. [name] and then subsequent chemo that she did not tolerate very well. She had seed implants placed but then underwent external beam Sarah radiation in the spring 2019. 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 Her adjunctive radiation was from 03/11/2017 through 04/21/2017. Unfortunately she had Sarah left breast biopsy on 11/24/2019 and it was again positive for an invasive ductal carcinoma grade 3 he is to resume at receptor 80% positive. HER-2 negative progesterone negative. She opted for bilateral mastectomies which were done on 01/09/2020 on the right with no evidence of malignancy on the left stage IIb invasive ductal carcinoma. She again received chemotherapy  but she did not tolerate it because of wound dehiscence and low platelet counts according to the patient. Unfortunately her mastectomy wound dehisced on the left breast. She was taken back to the OR by Dr. WDonne Hazelof general surgery on 33/24/22 and she underwent  Sarah debridement and subsequent attempt at closure. She had been previously treated with Augmentin for suspected underlying infection. Once again the wound has dehisced in the mid aspect. It is roughly 1 cm deep there is tunnels both medially and laterally she. She still has sutures in place. Still describes constant pain drainage. She also has Sarah drain still in place that drains about 10 cc/day. Currently she is changing ABD pad several times Sarah day 4/12; patient went back to see [name] who remove most of the stitches and debrided her wound. She has been using silver alginate culture I did last time which was Sarah PCR culture showed Peptostreptococcus, Enterococcus faecalis and Staphylococcus epidermidis which is of questionable significance. [name] that the sutures have been removed. There are undermining areas superior medial superior lateral and Sarah deep tunneling area in the inferior part of the wound. There is no obvious purulence. I started her on Augmentin on 04/12/2020 and I will likely extend this for another week AMAZINGLY; she is already been approved for HBO and will start today. [name] Echocardiogram which I reviewed from 2019 showed Sarah normal ejection fraction.. I went over side effects of HBO with the patient and answered any of her questions. Her husband was present 5/3; [name]. She went to see [name] about 2-1/2 weeks ago according to the patient he can remove some of the surrounding debris. She will see him again later this week or early next week. She has been using silver alginate. Sarah lot of this is problematic since she has Medicaid which  traditionally does not cover wound care supplies although it traditionally does not cover HBO either Her wound actually looks quite Sarah bit better than the last time I saw this better looking surface healthy looking tissue. She has tunnels at 810 2 in 6 the deepest of these is at 2 at 2.2 cm there is no exposed bone. 5/10; the patient was coming in for nurse visit today but requested that I see the wound because of surface discoloration. She states the Iodoflex makes the surface of this turned "white". She is not really complaining of undue tenderness. She is changing this every second day. The goal of this had been to get the surface of the wound to Sarah vibrant red color and then potentially look at changing to Sarah silver collagen-based dressing, Puraply, and then perhaps an alternative skin substituteo Derma vest. The patient tells me she has an appoint with her oncologist [name] on the 19th. She apparently think she is going to get another PET scan. Based on this I would like his comment about whether there is possibility of any malignancy in the wound itself or just underneath it. Up to now we have not really thought that was true 5/17; nice improvement in the chest wound. Less depth. We have been using Iodoflex to clean off the wound surface this seems to have done nicely. Patient went to see [name] 2 days ago who has referred her to see Dr. Claudia Desanctis. I certainly agree with the thought the surface of the wound looks Sarah lot better now and I think consideration of plastic surgery is certainly something the patient should discuss. I have talked to the patient about this today and I certainly support Sarah conversation with Dr. Claudia Desanctis. I also wondered about is there any residual cancer in this. [name] apparently did not seem  to think so per the patient although I do not see that in the text conversations we have had. I also wonder about Sarah skin substitute if her version of Medicare [[employer]] would  cover this. [name] is apparently considering starting her on [place]. Patient is already very scared about the side effects but she will discuss this with [name] on 5/19 5/31; patient's wound about the same in terms of size she still has the tunneling area to the left. Surface of the wound looks Sarah lot better there has been some filling in the granulation I have been included on serial text between Dr. Callie Fielding surgery] Dr. Ruffin Frederick oncology] and Dr. Para Skeans surgery]. I think there is not Sarah lot of thought that the patient has recurrence of her cancer her MRI was indeterminant but [name] did not feel that there was any reason to think she has Sarah major recurrence and he was not in favor of doing Sarah dedicated breast MRI. Dr. Claudia Desanctis wants to go ahead with Sarah flap closure I did it. I have not reviewed his note but per the patient [name] latissimus dorsi myocutaneous flap or I think Sarah skin flap from her abdomen. I have talked to the patient I think it is time to attempt this. If I can continue hyperbarics surrounding the flap then we will attempt to do this 6/14; patient using Prisma as the primary dressing here. She has completed hyperbarics and did very well. Her wound is responded nicely. The depth of this and the condition of the granulation tissue looks Sarah lot better. She has undermining areas at 2 and 10:00 both of these seem to be better as well. She is scheduled for flap closure on 7/11 by Dr. Claudia Desanctis 6/29; the patient continues to use Prisma wet-to-dry. Her graft is scheduled for 7/11 by Dr. Claudia Desanctis. We are looking at getting her additional hyperbaric treatment which I think might help with the likelihood of graft survival in this difficult irradiated area 7/21; patient returns to clinic to reinitiate HBO after her surgical procedure by Dr. Claudia Desanctis. On 07/15/2020 she had debridement of left chest wound and Sarah rotational flap closure. She also had excision of right  excess axillary tissue. Pathology did not show any malignancy and follow-up Dr. Claudia Desanctis saw the patient yesterday. He noted Sarah 2 cm area of wound dehiscence. Recommended she return for hyperbaric oxygen and she returns today for her first treatment. Follow-up with him in 2 weeks. We were not actually going to see the patient today her appointment to be evaluated was Monday however she was concerned about Sarah "fishy odor". She is also noted some drainage. Not specifically dressing in the wound area but covering with Adaptic 7/25; I am seeing this wound and follow-up. So far her culture is polymicrobial not growing any specific anything specific nevertheless with the Augmentin and the silver alginate her odor is Sarah lot better. Not as much periwound tenderness. Dimensions measuring 2.7 x 3.3 x 1.6. She has undermining from 10-1 o'clock of about 1 and half centimeters. Necrotic fat on the surface of this... 08/13/2020 upon evaluation today patient appears to be doing decently well in regard to her wound all things considered. Obviously this is much more open than what was hoped for based on the fact that she had Sarah flap performed which was supposed to completely close this wound. Nonetheless currently she tells me that she is not really having any significant pain fortunately the drainage  is improved compared to last time there is still some evidence of odor as far as the drainage is concerned but is definitely not as purulent as what was previously noted. With that being said I do believe that there are quite Sarah few sutures which are quite embedded. Nonetheless I believe this needs to be removed. In fact if they are not I am afraid this is again actually grow into the skin and that is can to be even bigger issue. Some of them even so at this point appeared that there could be difficult to even get out. 8/17; this is the first time I am seeing this in about 3 weeks. The patient had stop by my office yesterday to  discuss after completing hyperbaric therapy. Based on what she said I was expecting the worst today however apparently with dressing changes and perhaps debridement last week the surface of the wound actually looks better than what I was expecting. There is considerable depth although the last time I saw this the base of the wound was necrotic there is still some necrotic tissue on the surface of the wound bed however in general the rest the tissue looks healthy even with some bleeding. She has been using cleaning the wound with Dakin's and then applying Prisma and then backing Dakin's wet-to-dry. This seems to work to clean the wound surface up. They also ordered Sarah wound VAC and I discussed this with her in depth today She is not systemically unwell. She is not running Sarah fever there is no odor. She talks about pain over the lower part of the flap to touch. This does not appear to be related to movement of her shoulder. 8/23; the patient has been using the wound VAC for 1 week. Already Sarah remarkable improvement and she appears to be tolerating this well we had Santyl under the wound VAC however the surface looks so clean today I am doubtful that is going to be necessary She is also being treated with HBO. This is helped her dramatically. Apparently her current certification will be up on 8/31. She is only had 19 dives and we will certainly need to recertify her up for this. Since I saw her last week I put in Sarah secure text message to Dr. Claudia Desanctis through epic to let him know that we were going ahead with Sarah wound VAC. He did not have any objections to this. He is offering her more surgery however in the eventuality that this does not close 8/30; the patient missed 2 treatments of HBO last week because of nausea. I think she is now on Compazine which seems to have helped. Otherwise she is tolerating HBO well. We are collagen under wound VAC. Direct depth of wound measured at 1.2 cm which I think is probably  unchanged however the undermining area is down to 0.5 which I think is an improvement 9/6; patient continues with chemotherapy although she says she is troll of her nausea. We are using collagen with Sarah wound VAC on the remaining wound on her chest. No major improvements in the undermining area from 12-5 currently measuring 0.5 cm. This is no better than last week 9/13; patient continuing with chemotherapy. She completes HBO today. We are using silver collagen under wound VAC. No major improvements however the undermining area seems somewhat better to me. 9/20; we are using silver collagen under the wound VAC and although about 60% of the wound looks healthy. At the base of the wound there  is still Sarah very adherent slough. She has been approved for TheraSkin which I would like to use if we can get the wound bed in Sarah cleaner situation. 9/27; we are using Santyl under the wound VAC. Dimensions are slightly better including depth of 0.7 cm. Slight improvement in length and width the undermining area currently at 12-2 done in itself and improvement measures 0.6 which is not too much of improvement in terms of the probable depth 10/4; using Santyl under the wound VAC. Comes in today with Sarah fairly dramatic improvement in surface area and marked improvement in the undermining area as well as condition of the wound bed. This is gratifying to see in view of this were going to continue the Santyl under the [facility] for another week. 10/11; patient presents for follow-up. She reports continued wound healing with Santyl and wound VAC. She has no issues or complaints today. She denies signs of infection. 10/17; although the wound appears to have contracted the measurements are not that much different and more problematically not much improvement in the depth and condition of the wound bed. She still has Sarah shallow undermining area perhaps it 0.3 that its been somewhat improved. She is using using Santyl under the wound  VAC in preparation for Sarah trial of TheraSkin 10/27; wound looked quite good today. We went ahead with TheraSkin #1 today. The wound VAC can be discontinued 11/10; the TheraSkin was very adherent today. Very difficult to remove. Nevertheless the underlying tissue looked very healthy. Minimal undermining. TheraSkin #2 applied today 11/27/2020 upon evaluation today patient actually appears to be doing excellent in regard to her wound. We are using TheraSkin currently which seems to be doing great. With this is actually application #3. Overall the wound is significantly smaller and I think we are headed in the right direction. Electronic Signature(s) Signed: 11/27/2020 1:31:04 PM By: Worthy Keeler PA-C Entered By: Worthy Keeler on 11/27/2020 13:31:04 -------------------------------------------------------------------------------- Physical Exam Details Patient Name: Date of Service: [name] [name] 11/27/2020 12:30 PM Medical Record Number: [id number] Patient Account Number: [id number] Date of Birth/Sex: Treating RN: [birth year] (50 y.o. [name] Primary Care Provider: [name] Other Clinician: Referring Provider: Treating Provider/Extender: Randon Goldsmith in Treatment: 91 Constitutional Well-nourished and well-hydrated in no acute distress. Respiratory normal breathing without difficulty. Psychiatric this patient is able to make decisions and demonstrates good insight into disease process. Alert and Oriented x 3. pleasant and cooperative. Notes Upon inspection patient's wound bed showed signs of good granulation epithelization at this point. Fortunately I do not see any signs of active infection systemically for locally which is great and overall I think we are headed in the appropriate way. I am going to use Sarah little bit of drawtex and behind the TheraSkin on top of the Adaptic. I did cut slits right over the center part of the Adaptic  where it comes in touch with the TheraSkin that will allow for drainage to come through without getting trapped underneath which I think can also be beneficial. We will see how this does over the next week. Electronic Signature(s) Signed: 11/27/2020 1:32:24 PM By: Worthy Keeler PA-C Entered By: Worthy Keeler on 11/27/2020 13:32:23 -------------------------------------------------------------------------------- Physician Orders Details Patient Name: Date of Service: [name] V [name] 11/27/2020 12:30 PM Medical Record Number: [id number] Patient Account Number: [id number] Date of Birth/Sex: Treating RN: 12/17/1970 (50 y.o. 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 - with Dr. Dellia Nims Return Sarah Nurse Visit: - 1 week post theraskin for outer dressing change Cellular or Tissue Based Products Cellular or Tissue Based Product Type: - 11/27/20 : Theraskin #3 daptic or Mepitel. (DO NOT REMOVE). - may Cellular or Tissue Based Product applied to wound bed, secured with steri-strips, cover with Sarah replace secondary gauze as needed for drainage Bathing/ Shower/ Hygiene May shower with protection but do not get wound dressing(s) wet. Negative Presssure Wound Therapy Wound Vac to wound continuously at 180m/hg pressure - discontinue VAC Wound Treatment Wound #1 - Chest Wound Laterality: Left Cleanser: Wound Cleanser 1 x Per Week/15  Days Discharge Instructions: Cleanse the wound with wound cleanser prior to applying Sarah clean dressing using gauze sponges, not tissue or cotton balls. Prim Dressing: TheraSkin ary 1 x Per Week/15 Days Secondary Dressing: Woven Gauze Sponge, Non-Sterile 4x4 in 1 x Per Week/15 Days Discharge Instructions: Apply over primary dressing as directed. Secondary Dressing: Drawtex 4x4 in 1 x Per Week/15 Days Discharge Instructions: Apply over primary dressing as directed. Secondary Dressing: CarboFLEX Odor Control Dressing, 4x4 in 1 x Per Week/15 Days Discharge Instructions: Apply over primary dressing as directed. Secondary Dressing: ADAPTIC TOUCH 3x4.25 in 1 x Per Week/15 Days Discharge Instructions: Apply over primary dressing as directed. Secured With: Tegaderm Transparent Film Dressing 4x4.75 (in/in) 1 x Per Week/15 Days Discharge Instructions: Secure dressing with Tegaderm as directed. Electronic Signature(s) Signed: 11/27/2020 4:59:20 PM By: BLorrin JacksonSigned: 11/27/2020 5:33:12 PM By: SWorthy KeelerPA-C Entered By: BLorrin Jacksonon 11/27/2020 13:27:39 -------------------------------------------------------------------------------- Problem List Details Patient Name: Date of Service: ARoyetta CrochetV [name] 11/27/2020 12:30 PM Medical Record Number: [id number] Account Number: [id number] of Birth/Sex: Treating RN: [birth year](50y.o. 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 11/27/2020 12:30 PM Medical Record Number: [id number] Account Number: [id number] of Birth/Sex: Treating RN: [birth year](50y.o. FElam DutchPrimary Care Provider: JKarle PlumberOther Clinician: Referring Provider: Treating Provider/Extender: SRandon Goldsmithin Treatment:: 63Subjective Chief Complaint Information obtained from Patient 04/09/2020; patient is here for review of an area of wound dehiscence in the left breast mastectomy scar History of Present Illness (HPI) After canADMISSION 04/09/2020 [name] 50year old woman who is history of breast cancer in the left breast goes back to 2018. He was  found to have Sarah mass in her left breast. [name] and then subsequent chemo that she did not tolerate very well. She had seed implants placed but then underwent external beam Sarah radiation in the spring 2019. 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 Her adjunctive radiation was from 03/11/2017 through 04/21/2017. Unfortunately she had Sarah left breast biopsy on 11/24/2019 and it was again positive for an invasive ductal carcinoma grade 3 he is to resume at receptor 80% positive. HER-2 negative progesterone negative. She opted for bilateral mastectomies which were done on 01/09/2020 on the right with no evidence of malignancy on the left stage IIb invasive ductal carcinoma. She again  received chemotherapy but she did not tolerate it because of wound dehiscence and low platelet counts according to the patient. Unfortunately her mastectomy wound dehisced on the left breast. She was taken back to the OR by [name] of general surgery on 33/24/22 and she underwent Sarah debridement and subsequent attempt at closure. She had been previously treated with Augmentin for suspected underlying infection. Once again the wound has dehisced in the mid aspect. It is roughly 1 cm deep there is tunnels both medially and laterally she. She still has sutures in place. Still describes constant pain drainage. She also has Sarah drain still in place that drains about 10 cc/day. Currently she is changing ABD pad several times Sarah day 4/12; patient went back to see [name] who remove most of the stitches and debrided her wound. She has been using silver alginate culture I did last time which was Sarah PCR culture showed Peptostreptococcus, Enterococcus faecalis and Staphylococcus epidermidis which is of questionable significance. [name] that the sutures have been removed. There are undermining areas superior medial superior lateral and Sarah deep tunneling area in the inferior part of the wound. There is no obvious purulence. I started her on Augmentin on 04/12/2020 and I will likely extend this for another week AMAZINGLY; she is already been approved for HBO and will start today. [name] Echocardiogram which I reviewed from 2019 showed Sarah normal ejection fraction.. I went over side effects of HBO with the patient and answered any of her questions. Her husband was present 5/3; [name]. She went to see [name] about 2-1/2 weeks ago according to the patient he can remove some of the surrounding debris. She will see him again later this week or early next week. She has been using silver alginate. Sarah lot of this is problematic since she  has Medicaid which traditionally does not cover wound care supplies although it traditionally does not cover HBO either Her wound actually looks quite Sarah bit better than the last time I saw this better looking surface healthy looking tissue. She has tunnels at 810 2 in 6 the deepest of these is at 2 at 2.2 cm there is no exposed bone. 5/10; the patient was coming in for nurse visit today but requested that I see the wound because of surface discoloration. She states the Iodoflex makes the surface of this turned "white". She is not really complaining of undue tenderness. She is changing this every second day. The goal of this had been to get the surface of the wound to Sarah vibrant red color and then potentially look  at changing to Sarah silver collagen-based dressing, Puraply, and then perhaps an alternative skin substituteo Derma vest. The patient tells me she has an appoint with her oncologist [name] on the 19th. She apparently think she is going to get another PET scan. Based on this I would like his comment about whether there is possibility of any malignancy in the wound itself or just underneath it. Up to now we have not really thought that was true 5/17; nice improvement in the chest wound. Less depth. We have been using Iodoflex to clean off the wound surface this seems to have done nicely. Patient went to see [name] 2 days ago who has referred her to see Dr. Claudia Desanctis. I certainly agree with the thought the surface of the wound looks Sarah lot better now and I think consideration of plastic surgery is certainly something the patient should discuss. I have talked to the patient about this today and I certainly support Sarah conversation with Dr. Claudia Desanctis. I also wondered about is there any residual cancer in this. [name] apparently did not seem to think so per the patient although I do not see that in the text conversations we have had. I also wonder about Sarah skin substitute if her version of  Medicare [[employer]] would cover this. [name] is apparently considering starting her on [place]. Patient is already very scared about the side effects but she will discuss this with [name] on 5/19 5/31; patient's wound about the same in terms of size she still has the tunneling area to the left. Surface of the wound looks Sarah lot better there has been some filling in the granulation I have been included on serial text between Dr. Callie Fielding surgery] Dr. Ruffin Frederick oncology] and Dr. Para Skeans surgery]. I think there is not Sarah lot of thought that the patient has recurrence of her cancer her MRI was indeterminant but [name] did not feel that there was any reason to think she has Sarah major recurrence and he was not in favor of doing Sarah dedicated breast MRI. Dr. Claudia Desanctis wants to go ahead with Sarah flap closure I did it. I have not reviewed his note but per the patient [name] latissimus dorsi myocutaneous flap or I think Sarah skin flap from her abdomen. I have talked to the patient I think it is time to attempt this. If I can continue hyperbarics surrounding the flap then we will attempt to do this 6/14; patient using Prisma as the primary dressing here. She has completed hyperbarics and did very well. Her wound is responded nicely. The depth of this and the condition of the granulation tissue looks Sarah lot better. She has undermining areas at 2 and 10:00 both of these seem to be better as well. She is scheduled for flap closure on 7/11 by Dr. Claudia Desanctis 6/29; the patient continues to use Prisma wet-to-dry. Her graft is scheduled for 7/11 by Dr. Claudia Desanctis. We are looking at getting her additional hyperbaric treatment which I think might help with the likelihood of graft survival in this difficult irradiated area 7/21; patient returns to clinic to reinitiate HBO after her surgical procedure by Dr. Claudia Desanctis. On 07/15/2020 she had debridement of left chest wound and Sarah rotational flap closure. She also had  excision of right excess axillary tissue. Pathology did not show any malignancy and follow-up Dr. Claudia Desanctis saw the patient yesterday. He noted Sarah 2 cm area of wound dehiscence. Recommended she return for hyperbaric  oxygen and she returns today for her first treatment. Follow-up with him in 2 weeks. We were not actually going to see the patient today her appointment to be evaluated was Monday however she was concerned about Sarah "fishy odor". She is also noted some drainage. Not specifically dressing in the wound area but covering with Adaptic 7/25; I am seeing this wound and follow-up. So far her culture is polymicrobial not growing any specific anything specific nevertheless with the Augmentin and the silver alginate her odor is Sarah lot better. Not as much periwound tenderness. Dimensions measuring 2.7 x 3.3 x 1.6. She has undermining from 10-1 o'clock of about 1 and half centimeters. Necrotic fat on the surface of this... 08/13/2020 upon evaluation today patient appears to be doing decently well in regard to her wound all things considered. Obviously this is much more open than what was hoped for based on the fact that she had Sarah flap performed which was supposed to completely close this wound. Nonetheless currently she tells me that she is not really having any significant pain fortunately the drainage is improved compared to last time there is still some evidence of odor as far as the drainage is concerned but is definitely not as purulent as what was previously noted. With that being said I do believe that there are quite Sarah few sutures which are quite embedded. Nonetheless I believe this needs to be removed. In fact if they are not I am afraid this is again actually grow into the skin and that is can to be even bigger issue. Some of them even so at this point appeared that there could be difficult to even get out. 8/17; this is the first time I am seeing this in about 3 weeks. The patient had stop by my office  yesterday to discuss after completing hyperbaric therapy. Based on what she said I was expecting the worst today however apparently with dressing changes and perhaps debridement last week the surface of the wound actually looks better than what I was expecting. There is considerable depth although the last time I saw this the base of the wound was necrotic there is still some necrotic tissue on the surface of the wound bed however in general the rest the tissue looks healthy even with some bleeding. She has been using cleaning the wound with Dakin's and then applying Prisma and then backing Dakin's wet-to-dry. This seems to work to clean the wound surface up. They also ordered Sarah wound VAC and I discussed this with her in depth today She is not systemically unwell. She is not running Sarah fever there is no odor. She talks about pain over the lower part of the flap to touch. This does not appear to be related to movement of her shoulder. 8/23; the patient has been using the wound VAC for 1 week. Already Sarah remarkable improvement and she appears to be tolerating this well we had Santyl under the wound VAC however the surface looks so clean today I am doubtful that is going to be necessary She is also being treated with HBO. This is helped her dramatically. Apparently her current certification will be up on 8/31. She is only had 19 dives and we will certainly need to recertify her up for this. Since I saw her last week I put in Sarah secure text message to Dr. Claudia Desanctis through epic to let him know that we were going ahead with Sarah wound VAC. He did not have any objections  to this. He is offering her more surgery however in the eventuality that this does not close 8/30; the patient missed 2 treatments of HBO last week because of nausea. I think she is now on Compazine which seems to have helped. Otherwise she is tolerating HBO well. We are collagen under wound VAC. Direct depth of wound measured at 1.2 cm which I think  is probably unchanged however the undermining area is down to 0.5 which I think is an improvement 9/6; patient continues with chemotherapy although she says she is troll of her nausea. We are using collagen with Sarah wound VAC on the remaining wound on her chest. No major improvements in the undermining area from 12-5 currently measuring 0.5 cm. This is no better than last week 9/13; patient continuing with chemotherapy. She completes HBO today. We are using silver collagen under wound VAC. No major improvements however the undermining area seems somewhat better to me. 9/20; we are using silver collagen under the wound VAC and although about 60% of the wound looks healthy. At the base of the wound there is still Sarah very adherent slough. She has been approved for TheraSkin which I would like to use if we can get the wound bed in Sarah cleaner situation. 9/27; we are using Santyl under the wound VAC. Dimensions are slightly better including depth of 0.7 cm. Slight improvement in length and width the undermining area currently at 12-2 done in itself and improvement measures 0.6 which is not too much of improvement in terms of the probable depth 10/4; using Santyl under the wound VAC. Comes in today with Sarah fairly dramatic improvement in surface area and marked improvement in the undermining area as well as condition of the wound bed. This is gratifying to see in view of this were going to continue the Santyl under the [facility] for another week. 10/11; patient presents for follow-up. She reports continued wound healing with Santyl and wound VAC. She has no issues or complaints today. She denies signs of infection. 10/17; although the wound appears to have contracted the measurements are not that much different and more problematically not much improvement in the depth and condition of the wound bed. She still has Sarah shallow undermining area perhaps it 0.3 that its been somewhat improved. She is using using Santyl  under the wound VAC in preparation for Sarah trial of TheraSkin 10/27; wound looked quite good today. We went ahead with TheraSkin #1 today. The wound VAC can be discontinued 11/10; the TheraSkin was very adherent today. Very difficult to remove. Nevertheless the underlying tissue looked very healthy. Minimal undermining. TheraSkin #2 applied today 11/27/2020 upon evaluation today patient actually appears to be doing excellent in regard to her wound. We are using TheraSkin currently which seems to be doing great. With this is actually application #3. Overall the wound is significantly smaller and I think we are headed in the right direction. Objective Constitutional Well-nourished and well-hydrated in no acute distress. Vitals Time Taken: 1:03 PM, Height: 67 in, Weight: 257 lbs, BMI: 40.2, Temperature: 98.2 F, Pulse: 76 bpm, Respiratory Rate: 16 breaths/min, Blood Pressure: 117/81 mmHg. Respiratory normal breathing without difficulty. Psychiatric this patient is able to make decisions and demonstrates good insight into disease process. Alert and Oriented x 3. pleasant and cooperative. General Notes: Upon inspection patient's wound bed showed signs of good granulation epithelization at this point. Fortunately I do not see any signs of active infection systemically for locally which is great and overall I  think we are headed in the appropriate way. I am going to use Sarah little bit of drawtex and behind the TheraSkin on top of the Adaptic. I did cut slits right over the center part of the Adaptic where it comes in touch with the TheraSkin that will allow for drainage to come through without getting trapped underneath which I think can also be beneficial. We will see how this does over the next week. Integumentary (Hair, Skin) Wound #1 status is Open. Original cause of wound was Surgical Injury. The date acquired was: 01/16/2020. The wound has been in treatment 33 weeks. The wound is located on the Left  Chest. The wound measures 1.2cm length x 0.8cm width x 0.2cm depth; 0.754cm^2 area and 0.151cm^3 volume. There is Fat Layer (Subcutaneous Tissue) exposed. There is no tunneling or undermining noted. There is Sarah medium amount of serosanguineous drainage noted. The wound margin is distinct with the outline attached to the wound base. There is large (67-100%) red, friable granulation within the wound bed. There is no necrotic tissue within the wound bed. 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 Sarah skin graft procedure using Sarah bioengineered skin substitute/cellular or tissue based product was performed by Worthy Keeler, PA with the following instrument(s): Forceps and Scissors. Theraskin was applied and secured with Steri-Strips. 1.5 sq cm of product was utilized and 4.75 sq cm was wasted. Post Application, Adaptic, Drawtex was applied. Sarah Time Out was conducted at 13:20, prior to the start of the procedure. The procedure was tolerated well. Post procedure Diagnosis Wound #1: Same as Pre-Procedure . Plan Follow-up Appointments: Return Appointment in 2 weeks. - with Dr. Dellia Nims Nurse Visit: - 1 week post theraskin for outer dressing change Cellular or Tissue Based Products: Cellular or Tissue Based Product Type: - 11/27/20 : Theraskin #3 Cellular or Tissue Based Product applied to wound bed, secured with steri-strips, cover with Adaptic or Mepitel. (DO NOT REMOVE). - may replace secondary gauze as needed for drainage Bathing/ Shower/ Hygiene: May shower with protection but do not get wound dressing(s) wet. Negative  Presssure Wound Therapy: Wound Vac to wound continuously at 152m/hg pressure - discontinue VAC WOUND #1: - Chest Wound Laterality: Left Cleanser: Wound Cleanser 1 x Per Week/15 Days Discharge Instructions: Cleanse the wound with wound cleanser prior to applying Sarah clean dressing using gauze sponges, not tissue or cotton balls. Prim Dressing: TheraSkin 1 x Per Week/15 Days ary Secondary Dressing: Woven Gauze Sponge, Non-Sterile 4x4 in 1 x Per Week/15 Days Discharge Instructions: Apply over primary dressing as directed. Secondary Dressing: Drawtex 4x4 in 1 x Per Week/15 Days Discharge Instructions: Apply over primary dressing as directed. Secondary Dressing: CarboFLEX Odor Control Dressing, 4x4 in 1 x Per Week/15 Days Discharge Instructions: Apply over primary dressing as directed. Secondary Dressing: ADAPTIC TOUCH 3x4.25 in 1 x Per Week/15 Days Discharge Instructions: Apply over primary dressing as directed. Secured With: Tegaderm Transparent Film Dressing 4x4.75 (in/in) 1 x Per Week/15 Days Discharge Instructions: Secure dressing with Tegaderm as directed. 1. I did go ahead and reapply the third application of TheraSkin that is doing well and I think that it is definitely indicated to continue based on what we are  seeing. 2. Also can recommend that we continue with the CarboFlex and were using drawtex underneath the CarboFlex to pull everything out and hopefully trapping the odor. 3. We are continue to use Tegaderm to secure in place which I think is doing Sarah good job. We will see patient back for reevaluation in 1 week here in the clinic. If anything worsens or changes patient will contact our office for additional recommendations. This will be Sarah nurse visit we will see her in 2 weeks for provider visit. Electronic Signature(s) Signed: 11/27/2020 1:33:08 PM By: Worthy Keeler PA-C Entered By: Worthy Keeler on 11/27/2020  13:33:07 -------------------------------------------------------------------------------- SuperBill Details Patient Name: Date of Service: [name] 11/27/2020 Medical Record Number: [id number] Patient Account Number: [id number] Date of Birth/Sex: Treating RN: 1970-07-12 (50 y.o. 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 Facility Procedures CPT4 Code: [id number] Description: [phone]- Theraskin per 1sq cm extra small -6 sq cm ICD-10 Diagnosis Description T81.31XD Disruption of external operation (surgical) wound, not elsewhere classified, subs Modifier: equent encounter Quantity: 6 CPT4 Code: [id number] Description: 28206 - SKIN SUB GRAFT TRNK/ARM/LEG ICD-10 Diagnosis Description T81.31XD Disruption of external operation (surgical) wound, not elsewhere classified, subs Modifier: equent encounter Quantity: 1 Physician Procedures : CPT4 Code Description Modifier [id number] - WC PHYS SKIN SUB GRAFT TRNK/ARM/LEG ICD-10 Diagnosis Description T81.31XD Disruption of external operation (surgical) wound, not elsewhere classified, subsequent encounter Quantity: 1 Electronic Signature(s) Signed: 12/03/2020 5:45:41 PM By: Levan Hurst RN, BSN Signed: 12/04/2020 6:11:43 PM By: Worthy Keeler PA-C Previous Signature: 11/27/2020 4:59:20 PM Version By: Lorrin Jackson Previous Signature: 11/27/2020 5:33:12 PM Version By: Worthy Keeler PA-C Entered By: Levan Hurst on 12/03/2020 13:23:06

## 2020-11-27 NOTE — Progress Notes (Signed)
[name], [name] ([id number]) Visit Report for 11/27/2020 Arrival Information Details Patient Name: Date of Service: Sarah [name] [name] 11/27/2020 12:30 PM Medical Record Number: [id number] Patient Account Number: [id number] Date of Birth/Sex: Treating RN: 1970-07-22 (50 y.o. [name] Primary Care Davier Tramell: [name] Other Clinician: Referring Avyanna Spada: Treating Kaliah Haddaway/Extender: Randon Goldsmith in Treatment: 33 Visit Information History Since Last Visit Added or deleted any medications: No Patient Arrived: Ambulatory Any new allergies or adverse reactions: No Arrival Time: 13:02 Had Sarah fall or experienced change in No Transfer Assistance: None activities of daily living that may affect Patient Identification Verified: Yes risk of falls: Secondary Verification Process Completed: Yes Signs or symptoms of abuse/neglect since last visito No Patient Requires Transmission-Based Precautions: No Hospitalized since last visit: No Patient Has Alerts: No Implantable device outside of the clinic excluding No cellular tissue based products placed in the center since last visit: Has Dressing in Place as Prescribed: Yes Pain Present Now: No Electronic Signature(s) Signed: 11/27/2020 4:59:20 PM By: Lorrin Jackson Entered By: Lorrin Jackson on 11/27/2020 13:02:54 -------------------------------------------------------------------------------- Encounter Discharge Information Details Patient Name: Date of Service: Sarah Crochet V [name] 11/27/2020 12:30 PM Medical Record Number: [id number] Patient Account Number: [id number] Date of Birth/Sex: Treating RN: 1970-09-06 (50 y.o. [name] Primary Care Wissam Resor: [name] Other Clinician: Referring Klarissa Mcilvain: Treating Mirjana Tarleton/Extender: Randon Goldsmith in Treatment: 33 Encounter Discharge Information Items Post Procedure Vitals Discharge Condition:  Stable Temperature (F): 98.2 Ambulatory Status: Ambulatory Pulse (bpm): 76 Discharge Destination: Home Respiratory Rate (breaths/min): 16 Transportation: Private Auto Blood Pressure (mmHg): 117/81 Schedule Follow-up Appointment: Yes Clinical Summary of Care: Provided on 11/27/2020 Form Type Recipient Paper Patient Patient Electronic Signature(s) Signed: 11/27/2020 4:59:20 PM By: Lorrin Jackson Entered By: Lorrin Jackson on 11/27/2020 13:37:15 -------------------------------------------------------------------------------- Lower Extremity Assessment Details Patient Name: Date of Service: Sarah Crochet V [name] 11/27/2020 12:30 PM Medical Record Number: [id number] Patient Account Number: [id number] Date of Birth/Sex: Treating RN: 08/05/1970 (49 y.o. [name] Primary Care Steele Stracener: [name] Other Clinician: Referring Tanvi Gatling: Treating Mahir Prabhakar/Extender: Randon Goldsmith in Treatment: 33 Electronic Signature(s) Signed: 11/27/2020 4:59:20 PM By: Lorrin Jackson Entered By: Lorrin Jackson on 11/27/2020 13:07:06 -------------------------------------------------------------------------------- Multi-Disciplinary Care Plan Details Patient Name: Date of Service: Sarah Crochet V [name] 11/27/2020 12:30 PM Medical Record Number: [id number] Patient Account Number: [id number] Date of Birth/Sex: Treating RN: [birth year] (50 y.o. [name] Primary Care Karriem Muench: [name] Other Clinician: Referring Dymond Gutt: Treating Domani Bakos/Extender: Randon Goldsmith in Treatment: 33 Active Inactive Wound/Skin Impairment Nursing Diagnoses: Impaired tissue integrity Knowledge deficit related to smoking impact on wound healing Knowledge deficit related to ulceration/compromised skin integrity Goals: Patient will demonstrate Sarah reduced rate of smoking or cessation of smoking Date Initiated: 04/09/2020 Date Inactivated:  06/18/2020 Target Resolution Date: 06/28/2020 Goal Status: Met Patient will have Sarah decrease in wound volume by X% from date: (specify in notes) Date Initiated: 04/09/2020 Date Inactivated: 07/03/2020 Target Resolution Date: 07/05/2020 Goal Status: Met Patient/caregiver will verbalize understanding of skin care regimen Date Initiated: 04/09/2020 Target Resolution Date: 12/25/2020 Goal Status: Active Ulcer/skin breakdown will have Sarah volume reduction of 30% by week 4 Date Initiated: 04/09/2020 Date Inactivated: 05/14/2020 Target Resolution Date: 05/11/2020 Unmet Reason: see wound Goal Status: Unmet meaurements. Interventions: Assess patient/caregiver ability to obtain necessary supplies Assess patient/caregiver ability to perform ulcer/skin care regimen upon admission and  as needed Assess ulceration(s) every visit Provide education on smoking Notes: Electronic Signature(s) Signed: 11/27/2020 4:59:20 PM By: Lorrin Jackson Entered By: Lorrin Jackson on 11/27/2020 13:21:42 -------------------------------------------------------------------------------- Pain Assessment Details Patient Name: Date of Service: Sarah Crochet V [name] 11/27/2020 12:30 PM Medical Record Number: [id number] Patient Account Number: [id number] Date of Birth/Sex: Treating RN: 14-May-1970 (50 y.o. [name] Primary Care Marcellus Pulliam: [name] Other Clinician: Referring Ahmir Bracken: Treating Kaysan Peixoto/Extender: Randon Goldsmith in Treatment: 33 Active Problems Location of Pain Severity and Description of Pain Patient Has Paino No Site Locations Pain Management and Medication Current Pain Management: Electronic Signature(s) Signed: 11/27/2020 4:59:20 PM By: Lorrin Jackson Entered By: Lorrin Jackson on 11/27/2020 13:03:56 -------------------------------------------------------------------------------- Patient/Caregiver Education Details Patient Name: Date of Service: [name]  [name] 11/23/2022andnbsp12:30 PM Medical Record Number: [id number] Patient Account Number: [id number] Date of Birth/Gender: Treating RN: 02/06/1970 (50 y.o. [name] Primary Care Physician: [name] Other Clinician: Referring Physician: Treating Physician/Extender: Randon Goldsmith in Treatment: 74 Education Assessment Education Provided To: Patient Education Topics Provided Wound/Skin Impairment: Methods: Demonstration, Explain/Verbal, Printed Responses: State content correctly Electronic Signature(s) Signed: 11/27/2020 4:59:20 PM By: Lorrin Jackson Entered By: Lorrin Jackson on 11/27/2020 13:22:10 -------------------------------------------------------------------------------- Wound Assessment Details Patient Name: Date of Service: Sarah Crochet V [name] 11/27/2020 12:30 PM Medical Record Number: [id number] Patient Account Number: [id number] Date of Birth/Sex: Treating RN: [birth year] (50 y.o. 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 Event: Surgical Injury Etiology: Date Acquired: 01/16/2020 Wound Status: Open Weeks Of Treatment: 33 Comorbid Hypertension, Osteoarthritis, Received Chemotherapy, Clustered Wound: No History: Received Radiation Photos Wound Measurements Length: (cm) 1.2 Width: (cm) 0.8 Depth: (cm) 0.2 Area: (cm) 0.754 Volume: (cm) 0.151 % Reduction in Area: 88.1% % Reduction in Volume: 97.8% Epithelialization: Medium (34-66%) Tunneling: No Undermining: No Wound Description Classification: Full Thickness Without Exposed Support Structures Wound Margin: Distinct, outline attached Exudate Amount: Medium Exudate Type:  Serosanguineous Exudate Color: red, brown Foul Odor After Cleansing: No Slough/Fibrino No Wound Bed Granulation Amount: Large (67-100%) Exposed Structure Granulation Quality: Red, Friable Fascia Exposed: No Necrotic Amount: None Present (0%) Fat Layer (Subcutaneous Tissue) Exposed: Yes Tendon Exposed: No Muscle Exposed: No Joint Exposed: No Bone Exposed: No Treatment Notes Wound #1 (Chest) Wound Laterality: Left Cleanser Wound Cleanser Discharge Instruction: Cleanse the wound with wound cleanser prior to applying Sarah clean dressing using gauze sponges, not tissue or cotton balls. Peri-Wound Care Topical Primary Dressing TheraSkin Secondary Dressing Woven Gauze Sponge, Non-Sterile 4x4 in Discharge Instruction: Apply over primary dressing as directed. Drawtex 4x4 in Discharge Instruction: Apply over primary dressing as directed. CarboFLEX Odor Control Dressing, 4x4 in Discharge Instruction: Apply over primary dressing as directed. ADAPTIC TOUCH 3x4.25 in Discharge Instruction: Apply over primary dressing as directed. Secured With Tegaderm Transparent Film Dressing 4x4.75 (in/in) Discharge Instruction: Secure dressing with Tegaderm as directed. Compression Wrap Compression Stockings Add-Ons Electronic Signature(s) Signed: 11/27/2020 4:59:20 PM By: Lorrin Jackson Entered By: Lorrin Jackson on 11/27/2020 13:11:05 -------------------------------------------------------------------------------- Vitals Details Patient Name: Date of Service: [name], Sarah V [name] 11/27/2020 12:30 PM Medical Record Number: [id number] Patient Account Number: [id number] Date of Birth/Sex: Treating RN: 1970-12-10 (50 y.o. [name] Primary Care Matias Thurman: [name] Other Clinician:  Referring Carinne Brandenburger: Treating Bosco Paparella/Extender: Randon Goldsmith in Treatment: 33 Vital Signs Time Taken: 13:03 Temperature (F): 98.2 Height (in): 67 Pulse (bpm):  76 Weight (lbs): 257 Respiratory Rate (breaths/min): 16 Body Mass Index (BMI): 40.2 Blood Pressure (mmHg): 117/81 Reference Range: 80 - 120 mg / dl Electronic Signature(s) Signed: 11/27/2020 4:59:20 PM By: Lorrin Jackson Entered By: Lorrin Jackson on 11/27/2020 13:03:27

## 2020-11-28 ENCOUNTER — Other Ambulatory Visit: Payer: Self-pay | Admitting: Oncology

## 2020-11-28 ENCOUNTER — Other Ambulatory Visit: Payer: Self-pay | Admitting: Hematology and Oncology

## 2020-11-28 DIAGNOSIS — E876 Hypokalemia: Secondary | ICD-10-CM

## 2020-11-29 ENCOUNTER — Other Ambulatory Visit (HOSPITAL_COMMUNITY): Payer: Self-pay

## 2020-11-29 ENCOUNTER — Encounter: Payer: Self-pay | Admitting: Oncology

## 2020-12-02 ENCOUNTER — Encounter: Payer: Self-pay | Admitting: Oncology

## 2020-12-02 ENCOUNTER — Other Ambulatory Visit (HOSPITAL_COMMUNITY): Payer: Self-pay

## 2020-12-02 MED ORDER — TRAMADOL HCL 50 MG PO TABS
ORAL_TABLET | Freq: Four times a day (QID) | ORAL | 0 refills | Status: DC | PRN
  Filled 2020-12-02: qty 120, 15d supply, fill #0
  Filled 2020-12-05: qty 40, 5d supply, fill #0
  Filled 2021-01-05: qty 40, 5d supply, fill #1
  Filled 2021-02-27: qty 40, 5d supply, fill #2

## 2020-12-02 MED ORDER — PROCHLORPERAZINE MALEATE 10 MG PO TABS
10.0000 mg | ORAL_TABLET | Freq: Four times a day (QID) | ORAL | 0 refills | Status: DC | PRN
  Filled 2020-12-02: qty 30, 8d supply, fill #0

## 2020-12-03 ENCOUNTER — Other Ambulatory Visit (HOSPITAL_COMMUNITY): Payer: Self-pay

## 2020-12-05 ENCOUNTER — Other Ambulatory Visit: Payer: Self-pay

## 2020-12-05 ENCOUNTER — Encounter (HOSPITAL_BASED_OUTPATIENT_CLINIC_OR_DEPARTMENT_OTHER): Payer: Medicaid Other | Attending: Internal Medicine | Admitting: Internal Medicine

## 2020-12-05 ENCOUNTER — Other Ambulatory Visit (HOSPITAL_COMMUNITY): Payer: Self-pay

## 2020-12-05 ENCOUNTER — Encounter: Payer: Self-pay | Admitting: Oncology

## 2020-12-05 DIAGNOSIS — Z9013 Acquired absence of bilateral breasts and nipples: Secondary | ICD-10-CM | POA: Insufficient documentation

## 2020-12-05 DIAGNOSIS — T8131XD Disruption of external operation (surgical) wound, not elsewhere classified, subsequent encounter: Secondary | ICD-10-CM | POA: Insufficient documentation

## 2020-12-05 DIAGNOSIS — Z853 Personal history of malignant neoplasm of breast: Secondary | ICD-10-CM | POA: Insufficient documentation

## 2020-12-05 NOTE — Progress Notes (Signed)
CARNELL, CASAMENTO ([id number]) Visit Report for 12/05/2020 SuperBill Details Patient Name: Date of Service: [name] [name] 12/05/2020 Medical Record Number: [id number] Patient Account Number: [id number] Date of Birth/Sex: Treating RN: [birth year] (50 y.o. 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

## 2020-12-05 NOTE — Progress Notes (Signed)
[name] ([id number]) Visit Report for 12/05/2020 Arrival Information Details Patient Name: Date of Service: Sarah [name] [name] 12/05/2020 3:00 PM Medical Record Number: [id number] Patient Account Number: [id number] Date of Birth/Sex: Treating RN: Feb 20, 1970 (50 y.o. [name], [name] Primary Care [name]: [name] Other Clinician: Referring [name]: Treating [name]/Extender: [name] in Treatment: 69 Visit Information History Since Last Visit Added or deleted any medications: No Patient Arrived: Ambulatory Any new allergies or adverse reactions: No Arrival Time: 15:40 Had Sarah fall or experienced change in No Accompanied By: self activities of daily living that may affect Transfer Assistance: None risk of falls: Patient Identification Verified: Yes Signs or symptoms of abuse/neglect since last visito No Secondary Verification Process Completed: Yes Hospitalized since last visit: No Patient Requires Transmission-Based Precautions: No Implantable device outside of the clinic excluding No Patient Has Alerts: No cellular tissue based products placed in the center since last visit: Has Dressing in Place as Prescribed: Yes Pain Present Now: No Electronic Signature(s) Signed: 12/05/2020 4:15:39 PM By: Sarah Gouty RN, BSN Entered By: [name] on 12/05/2020 15:44:03 -------------------------------------------------------------------------------- Clinic Level of Care Assessment Details Patient Name: Date of Service: Sarah Sarah Sarah L, [name] 12/05/2020 3:00 PM Medical Record Number: [id number] Patient Account Number: [id number] Date of Birth/Sex: Treating RN: 08/01/1970 (50 y.o. [name] Primary Care [name]: [name] Other Clinician: Referring [name]: Treating [name]/Extender: [name] in Treatment: 34 Clinic Level of Care Assessment Items TOOL 4 Quantity Score []  -  0 Use when only an EandM is performed on FOLLOW-UP visit ASSESSMENTS - Nursing Assessment / Reassessment X- 1 10 Reassessment of Co-morbidities (includes updates in patient status) X- 1 5 Reassessment of Adherence to Treatment Plan ASSESSMENTS - Wound and Skin Sarah ssessment / Reassessment X - Simple Wound Assessment / Reassessment - one wound 1 5 []  - 0 Complex Wound Assessment / Reassessment - multiple wounds []  - 0 Dermatologic / Skin Assessment (not related to wound area) ASSESSMENTS - Focused Assessment []  - 0 Circumferential Edema Measurements - multi extremities []  - 0 Nutritional Assessment / Counseling / Intervention []  - 0 Lower Extremity Assessment (monofilament, tuning fork, pulses) []  - 0 Peripheral Arterial Disease Assessment (using hand held doppler) ASSESSMENTS - Ostomy and/or Continence Assessment and Care []  - 0 Incontinence Assessment and Management []  - 0 Ostomy Care Assessment and Management (repouching, etc.) PROCESS - Coordination of Care X - Simple Patient / Family Education for ongoing care 1 15 []  - 0 Complex (extensive) Patient / Family Education for ongoing care X- 1 10 Staff obtains Programmer, systems, Records, T Results / Process Orders est []  - 0 Staff telephones HHA, Nursing Homes / Clarify orders / etc []  - 0 Routine Transfer to another Facility (non-emergent condition) []  - 0 Routine Hospital Admission (non-emergent condition) []  - 0 New Admissions / Biomedical engineer / Ordering NPWT Apligraf, etc. , []  - 0 Emergency Hospital Admission (emergent condition) X- 1 10 Simple Discharge Coordination []  - 0 Complex (extensive) Discharge Coordination PROCESS - Special Needs []  - 0 Pediatric / Minor Patient Management []  - 0 Isolation Patient Management []  - 0 Hearing / Language / Visual special needs []  - 0 Assessment of Community assistance (transportation, D/C planning, etc.) []  - 0 Additional assistance / Altered mentation []  -  0 Support Surface(s) Assessment (bed, cushion, seat, etc.) INTERVENTIONS - Wound Cleansing / Measurement []  - 0 Simple Wound Cleansing - one wound []  -  0 Complex Wound Cleansing - multiple wounds []  - 0 Wound Imaging (photographs - any number of wounds) []  - 0 Wound Tracing (instead of photographs) []  - 0 Simple Wound Measurement - one wound []  - 0 Complex Wound Measurement - multiple wounds INTERVENTIONS - Wound Dressings X - Small Wound Dressing one or multiple wounds 1 10 []  - 0 Medium Wound Dressing one or multiple wounds []  - 0 Large Wound Dressing one or multiple wounds []  - 0 Application of Medications - topical []  - 0 Application of Medications - injection INTERVENTIONS - Miscellaneous []  - 0 External ear exam []  - 0 Specimen Collection (cultures, biopsies, blood, body fluids, etc.) []  - 0 Specimen(s) / Culture(s) sent or taken to Lab for analysis []  - 0 Patient Transfer (multiple staff / Civil Service fast streamer / Similar devices) []  - 0 Simple Staple / Suture removal (25 or less) []  - 0 Complex Staple / Suture removal (26 or more) []  - 0 Hypo / Hyperglycemic Management (close monitor of Blood Glucose) []  - 0 Ankle / Brachial Index (ABI) - do not check if billed separately X- 1 5 Vital Signs Has the patient been seen at the hospital within the last three years: Yes Total Score: 70 Level Of Care: New/Established - Level 2 Electronic Signature(s) Signed: 12/05/2020 4:15:39 PM By: Sarah Gouty RN, BSN Entered By: [name] on 12/05/2020 16:12:46 -------------------------------------------------------------------------------- Encounter Discharge Information Details Patient Name: Date of Service: [name], [name] 12/05/2020 3:00 PM Medical Record Number: [id number] Patient Account Number: [id number] Date of Birth/Sex: Treating RN: 10-Aug-1970 (50 y.o. [name] Primary Care [name]: [name] Other Clinician: Referring [name]: Treating  [name]/Extender: [name] in Treatment: 34 Encounter Discharge Information Items Discharge Condition: Stable Ambulatory Status: Ambulatory Discharge Destination: Home Transportation: Private Auto Accompanied By: self Schedule Follow-up Appointment: Yes Clinical Summary of Care: Patient Declined Electronic Signature(s) Signed: 12/05/2020 4:15:39 PM By: Sarah Gouty RN, BSN Entered By: [name] on 12/05/2020 16:13:44 -------------------------------------------------------------------------------- Patient/Caregiver Education Details Patient Name: Date of Service: [name] [name] 12/1/2022andnbsp3:00 PM Medical Record Number: [id number] Patient Account Number: [id number] Date of Birth/Gender: Treating RN: [birth year] (50 y.o. [name] Primary Care Physician: [name] Other Clinician: Referring Physician: Treating Physician/Extender: [name] in Treatment: 34 Education Assessment Education Provided To: Patient Education Topics Provided Wound/Skin Impairment: Methods: Explain/Verbal Responses: Reinforcements needed, State content correctly [employer]) Signed: 12/05/2020 4:15:39 PM By: Sarah Gouty RN, BSN Entered By: [name] on 12/05/2020 16:13:27 -------------------------------------------------------------------------------- Wound Assessment Details Patient Name: Date of Service: [name], [name] 12/05/2020 3:00 PM Medical Record Number: [id number] Patient Account Number: [id number] Date of Birth/Sex: Treating RN: August 01, 1970 (50 y.o. 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 Event:  Surgical Injury Etiology: Date Acquired: 01/16/2020 Wound Status: Open Weeks Of Treatment: 34 Comorbid Hypertension, Osteoarthritis, Received Chemotherapy, Clustered Wound: No History: Received Radiation Wound Measurements Length: (cm) 1.2 Width: (cm) 0.8 Depth: (cm) 0.2 Area: (cm) 0.754 Volume: (cm) 0.151 % Reduction in Area: 88.1% % Reduction in Volume: 97.8% Epithelialization: Medium (34-66%) Wound Description Classification: Full Thickness Without Exposed Support Structures Wound Margin: Distinct, outline attached Exudate Amount: Medium Exudate Type: Serosanguineous Exudate Color: red, brown Foul Odor After Cleansing: No Slough/Fibrino  No Wound Bed Granulation Amount: Large (67-100%) Exposed Structure Granulation Quality: Red, Friable Fascia Exposed: No Necrotic Amount: None Present (0%) Fat Layer (Subcutaneous Tissue) Exposed: Yes Tendon Exposed: No Muscle Exposed: No Joint Exposed: No Bone Exposed: No Assessment Notes unable to visualize wound bed, theraskin and adaptic intact Treatment Notes Wound #1 (Chest) Wound Laterality: Left Cleanser Wound Cleanser Discharge Instruction: Cleanse the wound with wound cleanser prior to applying Sarah clean dressing using gauze sponges, not tissue or cotton balls. Peri-Wound Care Topical Primary Dressing TheraSkin Secondary Dressing Woven Gauze Sponge, Non-Sterile 4x4 in Discharge Instruction: Apply over primary dressing as directed. Drawtex 4x4 in Discharge Instruction: Apply over primary dressing as directed. CarboFLEX Odor Control Dressing, 4x4 in Discharge Instruction: Apply over primary dressing as directed. ADAPTIC TOUCH 3x4.25 in Discharge Instruction: Apply over primary dressing as directed. Secured With Tegaderm Transparent Film Dressing 4x4.75 (in/in) Discharge Instruction: Secure dressing with Tegaderm as directed. Compression Wrap Compression Stockings Add-Ons Electronic Signature(s) Signed: 12/05/2020  4:15:39 PM By: Sarah Gouty RN, BSN Entered By: [name] on 12/05/2020 16:12:03 -------------------------------------------------------------------------------- Vitals Details Patient Name: Date of Service: [name], [name] 12/05/2020 3:00 PM Medical Record Number: [id number] Patient Account Number: [id number] Date of Birth/Sex: Treating RN: [birth year] (50 y.o. [name], [name] Primary Care Charnee Turnipseed: [name] Other Clinician: Referring Marquet Faircloth: Treating Rolene Andrades/Extender: [name] in Treatment: 34 Vital Signs Time Taken: 15:45 Temperature (F): 98.3 Height (in): 67 Pulse (bpm): 78 Source: Stated Respiratory Rate (breaths/min): 18 Weight (lbs): 257 Blood Pressure (mmHg): 114/70 Source: Stated Reference Range: 80 - 120 mg / dl Body Mass Index (BMI): 40.2 Electronic Signature(s) Signed: 12/05/2020 4:15:39 PM By: Sarah Gouty RN, BSN Entered By: [name] on 12/05/2020 15:46:39

## 2020-12-06 ENCOUNTER — Other Ambulatory Visit (HOSPITAL_COMMUNITY): Payer: Self-pay

## 2020-12-06 ENCOUNTER — Other Ambulatory Visit: Payer: Self-pay | Admitting: Hematology and Oncology

## 2020-12-06 DIAGNOSIS — E876 Hypokalemia: Secondary | ICD-10-CM

## 2020-12-06 MED FILL — Lidocaine-Prilocaine Cream 2.5-2.5%: CUTANEOUS | 30 days supply | Qty: 30 | Fill #0 | Status: AC

## 2020-12-07 ENCOUNTER — Other Ambulatory Visit (HOSPITAL_COMMUNITY): Payer: Self-pay

## 2020-12-08 ENCOUNTER — Other Ambulatory Visit (HOSPITAL_COMMUNITY): Payer: Self-pay

## 2020-12-08 ENCOUNTER — Encounter: Payer: Self-pay | Admitting: Oncology

## 2020-12-09 ENCOUNTER — Other Ambulatory Visit (HOSPITAL_COMMUNITY): Payer: Self-pay

## 2020-12-09 ENCOUNTER — Encounter: Payer: Self-pay | Admitting: Oncology

## 2020-12-09 MED ORDER — POTASSIUM CHLORIDE CRYS ER 20 MEQ PO TBCR
EXTENDED_RELEASE_TABLET | Freq: Every day | ORAL | 2 refills | Status: DC
  Filled 2020-12-09: qty 30, 30d supply, fill #0
  Filled 2021-01-05: qty 30, 30d supply, fill #1
  Filled 2021-02-27: qty 30, 30d supply, fill #2

## 2020-12-10 ENCOUNTER — Other Ambulatory Visit (HOSPITAL_COMMUNITY): Payer: Self-pay

## 2020-12-10 NOTE — Progress Notes (Signed)
[place]  Telephone:(336) [phone] Fax:(336) [phone]     ID: [name] DOB: [birth year]  MR#: [id number]  OZY#:[id number]  Patient Care Team: [name], MD as PCP - General (Internal Medicine) Lynnex Fulp, [name], MD as Consulting Physician (Oncology) [name], MD as Attending Physician (Radiation Oncology) Delice Bison Charlestine Massed, NP as Nurse Practitioner (Hematology and Oncology) Rolm Bookbinder, MD as Consulting Physician (General Surgery) Claudia Desanctis Steffanie Dunn, MD as Consulting Physician (Plastic Surgery) Mauro Kaufmann, RN as Oncology Nurse Navigator Rockwell Germany, RN as Oncology Nurse Navigator Milus Banister, MD as Attending Physician (Gastroenterology) Ricard Dillon, MD as Consulting Physician (Internal Medicine) OTHER MD:  I connected with Duanne Moron on 12/11/20 at  3:50 PM EST by video enabled telemedicine visit and verified that I am speaking with the correct person using two identifiers.   I discussed the limitations, risks, security and privacy concerns of performing an evaluation and management service by telemedicine and the availability of in-person appointments. I also discussed with the patient that there may be a patient responsible charge related to this service. The patient expressed understanding and agreed to proceed.   Other persons participating in the visit and their role in the encounter: Patient's husband  Patient's location: Her car Provider's location: [facility]   I provided 15 minutes of face-to-face video visit time during this encounter, and > 50% was spent counseling as documented under my assessment & plan.   CHIEF COMPLAINT: Triple negative breast cancer; BRCA2 positive  CURRENT TREATMENT: olaparib    INTERVAL HISTORY: [name] was contacted today for a follow-up of her triple negative breast cancer.  She continues on anastrozole, which she is tolerating well, although vaginal  dryness is an issue.  She is participating in our pelvic physical therapy program and that seems to be helping.  She was started on olaparib on 08/13/2020.  We have gradually increase the dose and she is currently taking the full dose, 300 mg twice daily, with excellent tolerance.  She obtains this at no cost to herself.  She saw Dr. Berline Lopes on 11/20/2020 to again discuss BSO.  The plan is for [name] to evaluate her for umbilical hernia repair and then perhaps late January for the patient to proceed with bilateral salpingo-oophorectomy with or without hysterectomy.  REVIEW OF SYSTEMS: Zoi is very hopeful that with another "TheraSkin" application (she has had 4) her chest wall wound finally will close before the end of the year.  This is terrific.  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  A detailed review of systems today was otherwise stable   COVID 19 VACCINATION STATUS: refuses vaccination   BREAST CANCER HISTORY: From the original intake note:  The patient was seen in the emergency room 05/16/2016 with nonspecific complaints but also noting that she had a tender lump in her lateral left breast which she said she had noted the day before. Exam by the emergency room physician confirmed a 3 cm soft left lateral breast mass without overlying erythema or nipple changes. This was felt to be most consistent with fibrocystic change but the patient was referred back to her primary care physician for further evaluation. She saw the physician assistant in May 18 and then Dr. Wynetta Emery on 06/12/2016 who scheduled a bilateral diagnostic mammography with tomography and left breast ultrasonography at the [facility] 06/18/2016.  This found the breast density to be category B. In the left breast upper outer quadrant there was an area of asymmetry measuring up to 10.3 cm. On exam this was firm and palpable and on ultrasound there was  an irregular mass measuring 2.5 cm with some subtle changes in the surrounding tissue. In the left lower axilla there were 2 suspicious-looking lymph nodes.  Biopsy of the left breast upper outer quadrant and one of the suspicious lymph nodes 06/19/2016 showed ([id number]) both to be involved by invasive ductal carcinoma, grade 3, estrogen and progesterone receptor negative, HER-2 not amplified with a signals ratio being 1.53-1.74 and the number per cell 2.35-3.05. The MIB-1 was 90%.  The patient's subsequent history is as detailed below.   PAST MEDICAL HISTORY: Past Medical History:  Diagnosis Date   Arthritis    Breast cancer ([place])    Left Breast Cancer   Breast cancer ([place]) 63/89/3734   left    Complication of anesthesia    DAY SURGERY 2010 OR 2011 ASPIRATED AND STAYED OVERNIGHT   History of radiation therapy 03/11/17- 04/21/17   Left breast, 2 Gy in 25 fractions for a total dose of 50 Gy, Boost, 2 Gy in 5 fractions for a total dose of 10 Gy   Hypertension    OFF MEDS  SINCE CHEMO X 5 MONTHS   Malignant neoplasm of upper-outer quadrant of left breast in female, estrogen receptor negative ([place]) 06/23/2016   Personal history of chemotherapy 2019   Left Breast Cancer  07-2016-12-2016   Personal history of radiation therapy 2019   Left Breast Cancer    PAST SURGICAL HISTORY: Past Surgical History:  Procedure Laterality Date   ANKLE SURGERY Right    BREAST CYST EXCISION Right 07/15/2020   Procedure: Excision excess right axillary tissue;  Surgeon: Cindra Presume, MD;  Location: [facility];  Service: Plastics;  Laterality: Right;   BREAST LUMPECTOMY Left 01/27/2017   BREAST LUMPECTOMY WITH RADIOACTIVE SEED AND SENTINEL LYMPH NODE BIOPSY Left 01/27/2017   Procedure: LEFT BREAST LUMPECTOMY WITH RADIOACTIVE SEED LOCALIZATION, LEFT AXILLARY DEEP LYMPH NODE BIOPSY WITH RADIOACTIVE SEED LOCALIZATION, LEFT AXILLARY SENTINEL LYMPH NODE MAPPING AND BIOPSY WITH BLUE DYE INJECTION;  Surgeon: [name], MD;  Location: [facility];  Service: General;  Laterality: Left;   COLONOSCOPY WITH PROPOFOL N/A 02/01/2020   Procedure: COLONOSCOPY WITH PROPOFOL;  Surgeon: Milus Banister, MD;  Location: [facility];  Service: Endoscopy;  Laterality: N/A;   DEBRIDEMENT AND CLOSURE WOUND Left 03/28/2020   Procedure: DEBRIDEMENT AND CLOSURE OF MASTECTOMY WOUND;  Surgeon: Rolm Bookbinder, MD;  Location: [facility];  Service: General;  Laterality: Left;   DEBRIDEMENT AND CLOSURE WOUND Left 07/15/2020   Procedure: Debridement of left chest wound and rotational flap closure;  Surgeon: Cindra Presume, MD;  Location: [facility];  Service: Plastics;  Laterality: Left;  1.5 hour   ENDOSCOPIC MUCOSAL RESECTION  02/01/2020   Procedure: ENDOSCOPIC MUCOSAL RESECTION;  Surgeon: Milus Banister, MD;  Location: [facility];  Service: Endoscopy;;   HEMOSTASIS CLIP PLACEMENT  02/01/2020   Procedure: HEMOSTASIS CLIP PLACEMENT;  Surgeon: Milus Banister, MD;  Location: [facility];  Service: Endoscopy;;   IR FLUORO GUIDE PORT INSERTION RIGHT  07/14/2016   IR US GUIDE VASC ACCESS RIGHT  07/14/2016   MASTECTOMY W/ SENTINEL NODE BIOPSY Bilateral 01/09/2020   Procedure: BILATERAL MASTECTOMY WITH LEFT AXILLARY SENTINEL LYMPH NODE BIOPSY;  Surgeon: Rolm Bookbinder, MD;  Location: [facility];  Service:  General;  Laterality: Bilateral;   PORT-A-CATH REMOVAL Right 01/27/2017   Procedure: REMOVAL PORT-A-CATH;  Surgeon: [name], MD;  Location: [facility];  Service: General;  Laterality: Right;   PORTACATH PLACEMENT Right 01/09/2020   Procedure: INSERTION PORT-A-CATH;  Surgeon: Rolm Bookbinder, MD;  Location: [facility];  Service: General;  Laterality: Right;   SUBMUCOSAL LIFTING INJECTION  02/01/2020   Procedure: SUBMUCOSAL LIFTING INJECTION;  Surgeon: Milus Banister, MD;  Location: [facility];  Service: Endoscopy;;   TUBAL LIGATION     WISDOM TOOTH EXTRACTION      FAMILY HISTORY Family  History  Problem Relation Age of Onset   Diabetes Mother    Hypertension Mother    Arthritis Mother    Colon cancer Father 51       died .19 metastatic at time of diagnosis   Diabetes Father    Heart disease Father    Hypertension Father    Breast cancer Maternal Grandmother 42       d.44s   Breast cancer Maternal Aunt 52   Cervical cancer Maternal Aunt 55   Breast cancer Maternal Aunt 46       d.50s   Cancer Maternal Uncle        d.62s unspecified type of cancer   Breast cancer Cousin 85       paternal first-cousin (daughter of unaffected aunt)   Cancer Maternal Aunt 76       "Female Cancer"   Cancer Maternal Aunt        unknown cancer   Brain cancer Daughter 56   Esophageal cancer Other    Colon polyps Neg Hx    Rectal cancer Neg Hx    Stomach cancer Neg Hx   The patient's father died at age 42 from colon cancer which she never had treated. The patient's mother is living, 53 years old as of June 2018. The patient has one brother, one sister. On the mother's side there is a history of breast cancer in the grandmother, age 3, and then 1 aunt with breast cancer age 67, and another with lung cancer age 71. There is no history of ovarian cancer in the family.   GYNECOLOGIC HISTORY:  No LMP recorded (lmp unknown). Patient is postmenopausal. Menarche age 38, first live birth age 30. She is GX P4. Her periods have never been regular. She had a period in June 2018 but not for 4 months before that. She took birth control for some years remotely with no complications. She is status post bilateral tubal ligation.   SOCIAL HISTORY: Updated October 2022 The patient's husband owns and runs a food truck.. They serve mostly [place] food and they generally deal with businesses, for example [employer].  [employer] helps with the scheduling. Her husband Kayleah Appleyard (goes by "[name]") is originally from [name]. The patient has two children from her first marriage. 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  ADVANCED DIRECTIVES: In the absence of any documents to the contrary the patient's husband is her healthcare power of attorney   HEALTH MAINTENANCE: Social History   Tobacco Use   Smoking status: Never   Smokeless tobacco: Never  Vaping Use   Vaping Use: Never used  Substance Use Topics   Alcohol use: No   Drug use: No     Colonoscopy: 02/01/2020  XNT:ZGYF 2018  Bone density:Never   Allergies  Allergen Reactions   Carboplatin Other (See Comments)    Red, blotchy, hot    Current Outpatient Medications  Medication Sig Dispense Refill   anastrozole (ARIMIDEX) 1 MG tablet TAKE 1 TABLET BY MOUTH DAILY. 90 tablet 4   COLLAGEN MATRIX MESHED, PORC, EX Apply 1 each topically every other day. Collagen Wound Packing every other day (Patient not taking: Reported on 11/19/2020)     gabapentin (NEURONTIN) 100 MG capsule TAKE 2 CAPSULES BY MOUTH IN THE MORNING AND 2 CAPSULES IN THE AFTERNOON 240 capsule 3   gabapentin (NEURONTIN) 300 MG capsule Take 3 capsules (900 mg total) by mouth at bedtime. 180 capsule 4   lidocaine-prilocaine (EMLA) cream APPLY TO AFFECTED AREA ONCE AS DIRECTED (Patient taking differently: Apply 1 application topically daily as needed (prior to lab draws/port flushes).) 30 g 3   olaparib (LYNPARZA) 150 MG tablet Take 2 tablets (300 mg total) by mouth 2 (two) times daily. Swallow whole. May take with food to  decrease nausea and vomiting.Start May 23, 2020 120 tablet 6   potassium chloride SA (KLOR-CON M) 20 MEQ tablet TAKE 1 TABLET BY MOUTH ONCE A DAY 30 tablet 2   prochlorperazine (COMPAZINE) 10 MG tablet Take 1 tablet (10 mg total) by mouth every 6 (six) hours as needed for nausea or vomiting. 30 tablet 0   senna-docusate (SENOKOT-S) 8.6-50 MG tablet Take 2 tablets by mouth at bedtime.     sodium hypochlorite (DAKIN'S 1/2 STRENGTH) external solution Moisten gauze with Dakin's solution then wring out leaving gauze damp not saturated before packing into the wound bed lightly as directed (Patient not taking: Reported on 11/19/2020) 473 mL 3   traMADol (ULTRAM) 50 MG tablet TAKE 1 TO 2 TABLETS BY MOUTH EVERY 6 HOURS AS NEEDED. 120 tablet 0   No current facility-administered medications for this visit.    OBJECTIVE: White woman who appears well  There were no vitals filed for this visit.     There is no height or weight on file to calculate BMI.   There were no vitals filed for this visit.  ECOG FS:1 - Symptomatic but completely ambulatory  Telemedicine visit 12/11/2020     Left chest wall 09/30/2020    Left chest wall 08/13/2020 Left chest wall 05/23/2020     LAB RESULTS:  CMP     Component Value Date/Time   NA 139 11/20/2020 1519   NA 140 01/07/2017 1342   K 3.9 11/20/2020 1519   K 3.5 01/07/2017 1342   CL 103 11/20/2020 1519   CO2 24 11/20/2020 1519   CO2 27 01/07/2017 1342   GLUCOSE 96 11/20/2020 1519   GLUCOSE 148 (H) 01/07/2017 1342   BUN 8 11/20/2020 1519   BUN 6.2 (L) 01/07/2017 1342   CREATININE 0.84 11/20/2020 1519   CREATININE 0.79 12/28/2019 1322   CREATININE 0.7 01/07/2017 1342   CALCIUM 9.3 11/20/2020 1519   CALCIUM 9.4 01/07/2017 1342   PROT 7.7 11/20/2020 1519   PROT 7.0 01/07/2017 1342   ALBUMIN 4.0 11/20/2020 1519  ALBUMIN 3.7 01/07/2017 1342   AST 19 11/20/2020 1519   AST 20 12/28/2019 1322   AST 23 01/07/2017 1342   ALT 16 11/20/2020 1519    ALT 19 12/28/2019 1322   ALT 24 01/07/2017 1342   ALKPHOS 89 11/20/2020 1519   ALKPHOS 105 01/07/2017 1342   BILITOT 0.7 11/20/2020 1519   BILITOT 0.9 12/28/2019 1322   BILITOT 0.84 01/07/2017 1342   GFRNONAA >60 11/20/2020 1519   GFRNONAA >60 12/28/2019 1322   GFRAA >60 05/23/2019 1921   GFRAA >60 10/13/2018 1346    No results found for: TOTALPROTELP, ALBUMINELP, A1GS, A2GS, BETS, BETA2SER, GAMS, MSPIKE, SPEI  No results found for: Nils Pyle, [facility]  Lab Results  Component Value Date   WBC 5.6 11/20/2020   NEUTROABS 3.6 11/20/2020   HGB 11.9 (L) 11/20/2020   HCT 35.9 (L) 11/20/2020   MCV 91.6 11/20/2020   PLT 210 11/20/2020      Chemistry      Component Value Date/Time   NA 139 11/20/2020 1519   NA 140 01/07/2017 1342   K 3.9 11/20/2020 1519   K 3.5 01/07/2017 1342   CL 103 11/20/2020 1519   CO2 24 11/20/2020 1519   CO2 27 01/07/2017 1342   BUN 8 11/20/2020 1519   BUN 6.2 (L) 01/07/2017 1342   CREATININE 0.84 11/20/2020 1519   CREATININE 0.79 12/28/2019 1322   CREATININE 0.7 01/07/2017 1342      Component Value Date/Time   CALCIUM 9.3 11/20/2020 1519   CALCIUM 9.4 01/07/2017 1342   ALKPHOS 89 11/20/2020 1519   ALKPHOS 105 01/07/2017 1342   AST 19 11/20/2020 1519   AST 20 12/28/2019 1322   AST 23 01/07/2017 1342   ALT 16 11/20/2020 1519   ALT 19 12/28/2019 1322   ALT 24 01/07/2017 1342   BILITOT 0.7 11/20/2020 1519   BILITOT 0.9 12/28/2019 1322   BILITOT 0.84 01/07/2017 1342       No results found for: LABCA2  No components found for: [id number]  No results for input(s): INR in the last 168 hours.  Urinalysis    Component Value Date/Time   COLORURINE YELLOW 12/05/2019 1154   APPEARANCEUR CLOUDY (A) 12/05/2019 1154   LABSPEC 1.024 12/05/2019 1154   LABSPEC 1.025 11/23/2016 1251   PHURINE 5.0 12/05/2019 1154   GLUCOSEU NEGATIVE 12/05/2019 1154   GLUCOSEU Negative 11/23/2016 1251   HGBUR SMALL (A) 12/05/2019 1154    BILIRUBINUR NEGATIVE 12/05/2019 1154   BILIRUBINUR Negative 11/23/2016 1251   KETONESUR NEGATIVE 12/05/2019 1154   PROTEINUR NEGATIVE 12/05/2019 1154   UROBILINOGEN 0.2 11/23/2016 1251   NITRITE NEGATIVE 12/05/2019 1154   LEUKOCYTESUR NEGATIVE 12/05/2019 1154   LEUKOCYTESUR Negative 11/23/2016 1251    STUDIES: No results found.   ELIGIBLE FOR AVAILABLE RESEARCH PROTOCOL: no   ASSESSMENT: 50 y.o. [place] woman status post left breast upper outer quadrant and left axillary lymph node biopsy 06/19/2016, both positive for a clinical T2-T3 N1, stage IIIB-C invasive ductal carcinoma, grade 3, triple negative, with an MIB-1 of 90%  (1) genetics testing 08/04/2016 showed a variant of uncertain significance in the BRCA2 namely c.8169T>A (p.Asp2723Glu). 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 Additional testing of the patient's mother and maternal aunt is pending. Otherwise [facility]'s Common Hereditary Cancers Panel found no deleterious mutations in APC, ATM, AXIN2, BARD1, BMPR1A, BRCA1, BRCA2, BRIP1, CDH1, CDKN2A, CHEK2, CTNNA1, DICER1, EPCAM, GREM1, HOXB13, KIT, MEN1, MLH1, MSH2, MSH3, MSH6, MUTYH, NBN,  NF1, NTHL1, PALB2, PDGFRA, PMS2, POLD1, POLE, PTEN, RAD50, RAD51C, RAD51D, SDHA, SDHB, SDHC, SDHD, SMAD4, SMARCA4, STK11, TP53, TSC1, TSC2, and VHL.  (2) neoadjuvant chemotherapy consisting of doxorubicin and cyclophosphamide in dose dense fashion 4 completed 09/10/2016 followed by paclitaxel weekly 12 given with carboplatin   (a) cycle 4 of cyclophosphamide and doxorubicin was delayed 10 days and dose decreased 10% because of febrile neutropenia after cycle 3  (b) cycle 6 of Paclitaxel and Carboplatin delayed due to neutropenia, therefore Granix added to Wednesday, Thursday, Friday following chemotherapy days  (3) status post left lumpectomy and left axillary sentinel lymph node sampling 01/27/2017 with pathology showing a complete pathologic response  (ypT0 ypN0);   (a) 5 left axillary lymph nodes removed  (4) adjuvant radiation 03/11/2017-04/21/2017  Site/dose:   1. Left breast, 2 Gy in 25 fractions for a total dose of 50 Gy                      2. 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  (8) status post bilateral mastectomies 01/09/2020 showing  (a) on the right, no evidence of malignancy  (b) on the left, a pT2 pNX, stage IIB invasive ductal carcinoma, grade 3, with ample margins   (c) repeat prognostic panel on the left again weakly estrogen receptor positive, HER2 negative, progesterone receptor negative  (9) adjuvant therapy with cyclophosphamide, methotrexate, fluorouracil (CMF) given every 21 days x 8, started 02/19/2020  (a) cycle 2 and subsequent cycles held due to surgical wound dehiscence  (10) left chest wall wound dehiscence following mastectomy January 2022.   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  ERV B2 and PI K3  CA were negative, and the microsatellite status was stable, with low mutational burden (4/MB)  (12) anastrozole started 04/18/2020   (A) olaparib added 08/13/2020 and 150 mg once a day  (B) dose increased to 150 mg twice daily as of 09/30/2020  (C) dose increased to 300 mg twice daily (target dose) as of 10/30/2020   PLAN: Layloni is coming up on a year from definitive surgery for her breast cancer.  She wanted to know if she is "cancer free" and of course what we say is that she is in remission.  When people say they are "cancer free" that is what they mean so she is very free to say that.  I am hopeful she will finally obtain a full closure of her chest wall wound.  She is going to have some surgery in January and she cannot decide if she  will have a hysterectomy in addition to bilateral salpingo-oophorectomy.  My recommendation is not to have a hysterectomy since there is no indication for it but if she decides to do it of course that only gets a little bit more time to the total surgery  She is tolerating olaparib well at full dose and we are continuing that.  She is also doing well with anastrozole  She will see [place] again in February and at that time assuming all is well we will assess continuation of the lapper rib and anastrozole and consider adding denosumab/Prolia to her treatment.  Total encounter time 25 minutes.*   Rally Ouch, [name], MD  12/11/20 4:03 PM Medical Oncology and Hematology [facility] [place], [place] [address] Tel. [phone]    Fax. [phone]   I, Wilburn Mylar, am acting as scribe for Dr. [name]. Sterling Mondo.  I, Lurline Del MD, have reviewed the above documentation for accuracy and completeness, and I agree with the above.   *Total Encounter Time as defined by the Centers for Medicare and Medicaid Services includes, in addition to the face-to-face time of a patient visit (documented in the note above) non-face-to-face  time: obtaining and reviewing outside history, ordering and reviewing medications, tests or procedures, care coordination (communications with other health care professionals or caregivers) and documentation in the medical record.

## 2020-12-11 ENCOUNTER — Other Ambulatory Visit: Payer: Self-pay

## 2020-12-11 ENCOUNTER — Ambulatory Visit: Payer: Medicaid Other | Attending: Oncology | Admitting: Physical Therapy

## 2020-12-11 ENCOUNTER — Inpatient Hospital Stay: Payer: Medicaid Other | Attending: Oncology | Admitting: Oncology

## 2020-12-11 DIAGNOSIS — C50412 Malignant neoplasm of upper-outer quadrant of left female breast: Secondary | ICD-10-CM

## 2020-12-11 DIAGNOSIS — C50912 Malignant neoplasm of unspecified site of left female breast: Secondary | ICD-10-CM

## 2020-12-11 DIAGNOSIS — M6281 Muscle weakness (generalized): Secondary | ICD-10-CM | POA: Insufficient documentation

## 2020-12-11 DIAGNOSIS — Z171 Estrogen receptor negative status [ER-]: Secondary | ICD-10-CM

## 2020-12-11 DIAGNOSIS — R293 Abnormal posture: Secondary | ICD-10-CM | POA: Diagnosis present

## 2020-12-11 NOTE — Patient Instructions (Signed)
Coconut oil - make small balls and

## 2020-12-11 NOTE — Therapy (Addendum)
[facility] [place], [place], [address] Phone: [phone]   Fax:  [phone]  Physical Therapy Evaluation  Patient Details  Name: [name] MRN: [id number] Date of Birth: February 02, 1970 Referring Provider (PT): Magrinat, [name], MD   Encounter Date: 12/11/2020   PT End of Session - 12/11/20 1758     Visit Number 1    Date for PT Re-Evaluation 03/05/21    Authorization Type  [employer]    PT Start Time 0355    PT Stop Time 1446    PT Time Calculation (min) 39 min    Activity Tolerance Patient tolerated treatment well    Behavior During Therapy [facility] for tasks assessed/performed             Past Medical History:  Diagnosis Date   Arthritis    Breast cancer ([place])    Left Breast Cancer   Breast cancer ([place]) 97/41/6384   left    Complication of anesthesia    DAY SURGERY 2010 OR 2011 ASPIRATED AND STAYED OVERNIGHT   History of radiation therapy 03/11/17- 04/21/17   Left breast, 2 Gy in 25 fractions for a total dose of 50 Gy, Boost, 2 Gy in 5 fractions for a total dose of 10 Gy   Hypertension    OFF MEDS  SINCE CHEMO X 5 MONTHS   Malignant neoplasm of upper-outer quadrant of left breast in female, estrogen receptor negative ([place]) 06/23/2016   Personal history of chemotherapy 2019   Left Breast Cancer  07-2016-12-2016   Personal history of radiation therapy 2019   Left Breast Cancer    Past Surgical History:  Procedure Laterality Date   ANKLE SURGERY Right    BREAST CYST EXCISION Right 07/15/2020   Procedure: Excision excess right axillary tissue;  Surgeon: Cindra Presume, MD;  Location: [place];  Service: Plastics;  Laterality: Right;   BREAST LUMPECTOMY Left 01/27/2017   BREAST LUMPECTOMY WITH RADIOACTIVE SEED AND SENTINEL LYMPH NODE BIOPSY Left 01/27/2017   Procedure: LEFT BREAST LUMPECTOMY WITH RADIOACTIVE SEED LOCALIZATION, LEFT AXILLARY DEEP LYMPH NODE BIOPSY WITH RADIOACTIVE SEED  LOCALIZATION, LEFT AXILLARY SENTINEL LYMPH NODE MAPPING AND BIOPSY WITH BLUE DYE INJECTION;  Surgeon: [name], MD;  Location: [place];  Service: General;  Laterality: Left;   COLONOSCOPY WITH PROPOFOL N/A 02/01/2020   Procedure: COLONOSCOPY WITH PROPOFOL;  Surgeon: Milus Banister, MD;  Location: [facility];  Service: Endoscopy;  Laterality: N/A;   DEBRIDEMENT AND CLOSURE WOUND Left 03/28/2020   Procedure: DEBRIDEMENT AND CLOSURE OF MASTECTOMY WOUND;  Surgeon: Rolm Bookbinder, MD;  Location: [facility];  Service: General;  Laterality: Left;   DEBRIDEMENT AND CLOSURE WOUND Left 07/15/2020   Procedure: Debridement of left chest wound and rotational flap closure;  Surgeon: Cindra Presume, MD;  Location: [place];  Service: Plastics;  Laterality: Left;  1.5 hour   ENDOSCOPIC MUCOSAL RESECTION  02/01/2020   Procedure: ENDOSCOPIC MUCOSAL RESECTION;  Surgeon: Milus Banister, MD;  Location: [facility];  Service: Endoscopy;;   HEMOSTASIS CLIP PLACEMENT  02/01/2020   Procedure: HEMOSTASIS CLIP PLACEMENT;  Surgeon: Milus Banister, MD;  Location: [facility];  Service: Endoscopy;;   IR FLUORO GUIDE PORT INSERTION RIGHT  07/14/2016   IR US GUIDE VASC ACCESS RIGHT  07/14/2016   MASTECTOMY W/ SENTINEL NODE BIOPSY Bilateral 01/09/2020   Procedure: BILATERAL MASTECTOMY WITH LEFT AXILLARY SENTINEL LYMPH NODE BIOPSY;  Surgeon: Rolm Bookbinder, MD;  Location: [facility];  Service: General;  Laterality: Bilateral;   PORT-A-CATH REMOVAL Right 01/27/2017   Procedure: REMOVAL PORT-A-CATH;  Surgeon: [name], MD;  Location: [place];  Service: General;  Laterality: Right;   PORTACATH PLACEMENT Right 01/09/2020   Procedure: INSERTION PORT-A-CATH;  Surgeon: Rolm Bookbinder, MD;  Location: [place];  Service: General;  Laterality: Right;   SUBMUCOSAL LIFTING INJECTION  02/01/2020   Procedure: SUBMUCOSAL LIFTING INJECTION;  Surgeon: Milus Banister, MD;  [facility];  Service: Endoscopy;;   TUBAL LIGATION     WISDOM TOOTH EXTRACTION      There were no vitals filed for this visit.    Subjective Assessment - 12/11/20 1412     Subjective Had breast cancer originally 2018 in June.  Pt had another  breast cancer diagnosis Novemver last year, another breast cancer surgery.  Having theraskin surgery.  Had FLAP surgery but that didn't heal.  Pt has hopefully one more graft and finally heal the Left breast region.  Pt reports bladder urgency, no leakage.  Nocturia 1x early morning hours.  Drinking 5-6 bottles of 16 oz.  Pt is not having any discomfort normally.                [facility] PT Assessment - 12/11/20 0001       Assessment   Medical Diagnosis C50.912 (ICD-10-CM) - Recurrent breast cancer, left (HCC);C50.412,Z17.1 (ICD-10-CM) - Malignant neoplasm of upper-outer quadrant of left breast in female, estrogen receptor negative ([place])    Referring Provider (PT) Magrinat, [name], MD      Precautions   Precautions None      Balance Screen   Has the patient fallen in the past 6 months No      [place] residence    Living Arrangements Spouse/significant other;Children   3 girls home on weekends     Prior Function   Level of Independence Independent    Vocation Part time employment      Cognition   Overall Cognitive Status Within Functional Limits for tasks assessed      Posture/Postural Control   Posture/Postural Control Postural limitations    Postural Limitations Rounded Shoulders;Forward head      ROM / Strength   AROM / PROM / Strength AROM;PROM;Strength      AROM   Overall AROM Comments lumbar flexion 75%      PROM   Overall PROM Comments hip flexion 80%      Strength   Overall Strength Comments 4/5 hip ext and abduction      Flexibility   Soft Tissue Assessment /Muscle Length yes    Hamstrings 80% bil                        Objective measurements completed on  examination: See above findings.     Pelvic Floor Special Questions - 12/11/20 0001     Are you Pregnant or attempting pregnancy? Yes    Prior Pregnancies Yes    Number of Pregnancies 4    Number of Vaginal Deliveries 4    Currently Sexually Active No    Urinary Leakage No    Urinary urgency Yes    Urinary frequency normal    Fecal incontinence No    Fluid intake 16 oz x 5    Pelvic Floor Internal Exam pt identity confirmed and informed consent to do internal assessment was given    Exam Type Vaginal  Palpation tight levators bil Lt>Rt; tight ischiocavernosis left - no TTP    Strength weak squeeze, no lift    Strength # of seconds 1    Tone high                            PT Long Term Goals - 12/11/20 1450       PT LONG TERM GOAL #1   Title Pt will be ind with advanced HEP    Baseline does not know    Time 12    Period Weeks    Status New    Target Date 03/05/21      PT LONG TERM GOAL #2   Title Pt will report reduced general pain and stiffness by at least 50% due to improved knowledge of how to strenghen stretch for pain management    Baseline feels 6/10 getting up from seated    Time 12    Period Weeks    Status New    Target Date 03/05/21      PT LONG TERM GOAL #3   Title Pt will be able to feel confident that she knows how to resume penetrative intercourse by working with dilators    Baseline does not know how    Time 12    Period Weeks    Status New    Target Date 03/05/21      PT LONG TERM GOAL #4   Title .Marland KitchenMarland Kitchen                    Plan - 12/11/20 1813     Clinical Impression Statement Pt presents to skilled PT to address pelvic floor muscle tension as she is currently 3/3 Marinoff scale without any ability to tolerate penetrative intercourse. Pt has comorbidities as mentioned below and still going through cancer treatment. Pt is on medication that side effects can exacerbate pelvic floor related issues.  Most of therapy for  this patient is to restore pelvic floor function, pain management, and prevent other pelvic floor related issue that occur along with tight and weakn pelvic floor muscles.  Pt will benefit from skilled PT to address all soft tissue and other impairments mentioned above for maximum function and qualitiy of life.    Personal Factors and Comorbidities Comorbidity 2    Comorbidities skin graft Lt breast, breast cancer    Examination-Activity Limitations Stand    Examination-Participation Restrictions Community Activity;Interpersonal Relationship    Stability/Clinical Decision Making Evolving/Moderate complexity    Clinical Decision Making Moderate    Rehab Potential Excellent    PT Frequency 1x / week    PT Duration 12 weeks    PT Treatment/Interventions ADLs/Self Care Home Management;Biofeedback;Electrical Stimulation;Moist Heat;Cryotherapy;Therapeutic activities;Therapeutic exercise;Neuromuscular re-education;Patient/family education;Manual techniques;Passive range of motion;Dry needling;Taping    PT Next Visit Plan internal STM to urethra and bladder, educate on dilators and have her purchase    PT Home Exercise Plan coconut oil balls    Consulted and Agree with Plan of Care Patient             Patient will benefit from skilled therapeutic intervention in order to improve the following deficits and impairments:  Impaired flexibility, Impaired tone, Postural dysfunction, Pain, Increased fascial restricitons, Decreased strength, Decreased endurance  Visit Diagnosis: Abnormal posture  Muscle weakness (generalized)     Problem List Patient Active Problem List   Diagnosis Date Noted   S/P bilateral mastectomy 01/09/2020  Recurrent breast cancer, left ([place]) 11/65/7903   Umbilical hernia without obstruction and without gangrene 11/07/2019   BRCA2 gene mutation positive in female 10/16/2019   Port-A-Cath in place 11/02/2016   Cellulitis 10/24/2016   Family history of breast cancer  10/08/2016   Genetic testing 09/21/2016   Sepsis ([place]) 08/27/2016   Rash 08/27/2016   Hypokalemia 08/27/2016   Neutropenic fever ([place]) 08/26/2016   Constipation 07/27/2016   Malignant neoplasm of upper-outer quadrant of left breast in female, estrogen receptor negative ([place]) 06/23/2016   Left breast mass 06/12/2016   Essential hypertension 06/12/2016   Class 3 severe obesity due to excess calories without serious comorbidity with body mass index (BMI) of 40.0 to 44.9 in adult ([place]) 06/12/2016   Low vitamin D level 06/12/2016   Irregular menses 06/12/2016    Jule Ser, PT 12/11/2020, 6:18 PM  [facility] [place], [place], [address] Phone: [phone]   Fax:  [phone]  Name: [name] MRN: [id number] Date of Birth: 06-24-1970  PHYSICAL THERAPY DISCHARGE SUMMARY  Visits from Start of Care: 1  Current functional level related to goals / functional outcomes: See above goals   Remaining deficits: See above   Education / Equipment: HEP   Patient agrees to discharge. Patient goals were not met. Patient is being discharged due to not returning since the last visit.  Only attended eval and no follow ups  Gustavus Bryant, PT 02/03/21 3:07 PM

## 2020-12-12 ENCOUNTER — Ambulatory Visit (HOSPITAL_COMMUNITY): Payer: Medicaid Other

## 2020-12-12 ENCOUNTER — Ambulatory Visit (HOSPITAL_COMMUNITY)
Admission: RE | Admit: 2020-12-12 | Discharge: 2020-12-12 | Disposition: A | Discharge status: Home / Self Care | Payer: Medicaid Other | Source: Ambulatory Visit | Attending: Gynecologic Oncology | Admitting: Gynecologic Oncology

## 2020-12-12 ENCOUNTER — Encounter (HOSPITAL_BASED_OUTPATIENT_CLINIC_OR_DEPARTMENT_OTHER): Payer: Medicaid Other | Admitting: Internal Medicine

## 2020-12-12 DIAGNOSIS — Z1509 Genetic susceptibility to other malignant neoplasm: Secondary | ICD-10-CM | POA: Diagnosis present

## 2020-12-12 DIAGNOSIS — Z1501 Genetic susceptibility to malignant neoplasm of breast: Secondary | ICD-10-CM | POA: Insufficient documentation

## 2020-12-12 DIAGNOSIS — Z1502 Genetic susceptibility to malignant neoplasm of ovary: Secondary | ICD-10-CM | POA: Diagnosis present

## 2020-12-12 NOTE — Progress Notes (Addendum)
[name] ([id number]) Visit Report for 12/12/2020 Cellular or Tissue Based Product Details Patient Name: Date of Service: [name] [name] 12/12/2020 2:45 PM Medical Record Number: [id number] Patient Account Number: [id number] Date of Birth/Sex: Treating RN: 1970/11/22 (50 y.o. [name] Primary Care Provider: [name] Other Clinician: Referring Provider: Treating Provider/Extender: [name] in Treatment: [phone] Cellular or Tissue Based Product Type Wound #1 Left Chest Applied to: Performed By: Physician Ricard Dillon., MD Cellular or Tissue Based Product Type: Theraskin Level of Consciousness (Pre-procedure): Awake and Alert Pre-procedure Verification/Time Out Yes - 15:15 Taken: Location: trunk / arms / legs Wound Size (sq cm): 1.3 Product Size (sq cm): 6 Waste Size (sq cm): 0 Amount of Product Applied (sq cm): 6 Instrument Used: Forceps, Scissors Lot #: [id number] Order #: 4 Expiration Date: 11/04/2023 Fenestrated: No Reconstituted: Yes Solution Type: normal saline Solution Amount: 6 mL Lot #: [id number] Solution Expiration Date: 09/05/2021 Secured: Yes Secured With: Steri-Strips, adaptic Dressing Applied: No Procedural Pain: 0 Post Procedural Pain: 0 Response to Treatment: Procedure was tolerated well Level of Consciousness (Post- Awake and Alert procedure): Post Procedure Diagnosis Same as Pre-procedure Electronic Signature(s) Signed: 12/12/2020 4:14:16 PM By: Deon Pilling RN, BSN Signed: 12/17/2020 4:30:32 PM By: [name] MD Previous Signature: 12/12/2020 4:05:05 PM Version By: [name] MD Entered By: Deon Pilling on 12/12/2020 16:13:37 -------------------------------------------------------------------------------- HPI Details Patient Name: Date of Service: [name] [name] 12/12/2020 2:45 PM Medical Record Number: [id number] Patient Account Number: [id number] Date of Birth/Sex:  Treating RN: 15-Apr-1970 (50 y.o. F) Primary Care Provider: [name] Other Clinician: Referring Provider: Treating Provider/Extender: [name] in Treatment: 23 History of Present Illness HPI Description: After canADMISSION 04/09/2020 [name] 50 year old woman who is history of breast cancer in the left breast goes back to 2018. He was found to have Sarah mass in her left breast. [name] and then subsequent chemo that she did not tolerate very well. She had seed implants placed but then underwent external beam Sarah radiation in the spring 2019. 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 Her adjunctive radiation was from 03/11/2017 through 04/21/2017. Unfortunately she had Sarah left breast biopsy on 11/24/2019 and it was again positive for an invasive ductal carcinoma grade 3 he is to resume at receptor 80% positive. HER-2 negative progesterone negative. She opted for bilateral mastectomies which were done on 01/09/2020 on the right with no evidence of malignancy on the left stage IIb invasive ductal carcinoma. She again received chemotherapy but she did not tolerate it because of wound dehiscence and low platelet counts according to the patient. Unfortunately her mastectomy wound dehisced on the left breast. She was taken back to the OR by [name] of general surgery on 33/24/22 and she underwent Sarah debridement and subsequent attempt at closure. She had been previously treated with Augmentin for suspected underlying infection. Once again the wound has dehisced in the mid aspect. It is roughly 1 cm deep there is tunnels both medially and laterally she. She still has sutures in place. Still describes constant pain drainage. She also has Sarah drain still in place that drains about 10 cc/day. Currently she is changing  ABD pad several times Sarah day 4/12; patient went back to see [name] who remove  most of the stitches and debrided her wound. She has been using silver alginate culture I did last time which was Sarah PCR culture showed Peptostreptococcus, Enterococcus faecalis and Staphylococcus epidermidis which is of questionable significance. [name] that the sutures have been removed. There are undermining areas superior medial superior lateral and Sarah deep tunneling area in the inferior part of the wound. There is no obvious purulence. I started her on Augmentin on 04/12/2020 and I will likely extend this for another week AMAZINGLY; she is already been approved for HBO and will start today. [name] Echocardiogram which I reviewed from 2019 showed Sarah normal ejection fraction.. I went over side effects of HBO with the patient and answered any of her questions. Her husband was present 5/3; [name]. She went to see [name] about 2-1/2 weeks ago according to the patient he can remove some of the surrounding debris. She will see him again later this week or early next week. She has been using silver alginate. Sarah lot of this is problematic since she has Medicaid which traditionally does not cover wound care supplies although it traditionally does not cover HBO either Her wound actually looks quite Sarah bit better than the last time I saw this better looking surface healthy looking tissue. She has tunnels at 810 2 in 6 the deepest of these is at 2 at 2.2 cm there is no exposed bone. 5/10; the patient was coming in for nurse visit today but requested that I see the wound because of surface discoloration. She states the Iodoflex makes the surface of this turned "white". She is not really complaining of undue tenderness. She is changing this every second day. The goal of this had been to get the surface of the wound to Sarah vibrant red color and then  potentially look at changing to Sarah silver collagen-based dressing, Puraply, and then perhaps an alternative skin substituteo Derma vest. The patient tells me she has an appoint with her oncologist [name] on the 19th. She apparently think she is going to get another PET scan. Based on this I would like his comment about whether there is possibility of any malignancy in the wound itself or just underneath it. Up to now we have not really thought that was true 5/17; nice improvement in the chest wound. Less depth. We have been using Iodoflex to clean off the wound surface this seems to have done nicely. Patient went to see [name] 2 days ago who has referred her to see Dr. Claudia Desanctis. I certainly agree with the thought the surface of the wound looks Sarah lot better now and I think consideration of plastic surgery is certainly something the patient should discuss. I have talked to the patient about this today and I certainly support Sarah conversation with Dr. Claudia Desanctis. I also wondered about is there any residual cancer in this. [name] apparently did not seem to think so per the patient although I do not see that in the text conversations we have had. I also wonder about Sarah skin substitute if her version of Medicare [[employer]] would cover this. [name] is apparently considering starting her on [place]. Patient is already very scared about the side effects but she will discuss this with [name] on 5/19 5/31; patient's wound about the same in terms of size she still has the tunneling area to the left. Surface of the wound  looks Sarah lot better there has been some filling in the granulation I have been included on serial text between Dr. Callie Fielding surgery] Dr. Ruffin Frederick oncology] and Dr. Para Skeans surgery]. I think there is not Sarah lot of thought that the patient has recurrence of her cancer her MRI was indeterminant but [name] did not feel that there was any reason to think  she has Sarah major recurrence and he was not in favor of doing Sarah dedicated breast MRI. Dr. Claudia Desanctis wants to go ahead with Sarah flap closure I did it. I have not reviewed his note but per the patient [name] latissimus dorsi myocutaneous flap or I think Sarah skin flap from her abdomen. I have talked to the patient I think it is time to attempt this. If I can continue hyperbarics surrounding the flap then we will attempt to do this 6/14; patient using Prisma as the primary dressing here. She has completed hyperbarics and did very well. Her wound is responded nicely. The depth of this and the condition of the granulation tissue looks Sarah lot better. She has undermining areas at 2 and 10:00 both of these seem to be better as well. She is scheduled for flap closure on 7/11 by Dr. Claudia Desanctis 6/29; the patient continues to use Prisma wet-to-dry. Her graft is scheduled for 7/11 by Dr. Claudia Desanctis. We are looking at getting her additional hyperbaric treatment which I think might help with the likelihood of graft survival in this difficult irradiated area 7/21; patient returns to clinic to reinitiate HBO after her surgical procedure by Dr. Claudia Desanctis. On 07/15/2020 she had debridement of left chest wound and Sarah rotational flap closure. She also had excision of right excess axillary tissue. Pathology did not show any malignancy and follow-up Dr. Claudia Desanctis saw the patient yesterday. He noted Sarah 2 cm area of wound dehiscence. Recommended she return for hyperbaric oxygen and she returns today for her first treatment. Follow-up with him in 2 weeks. We were not actually going to see the patient today her appointment to be evaluated was Monday however she was concerned about Sarah "fishy odor". She is also noted some drainage. Not specifically dressing in the wound area but covering with Adaptic 7/25; I am seeing this wound and follow-up. So far her culture is polymicrobial not growing any specific anything specific nevertheless with the Augmentin and the  silver alginate her odor is Sarah lot better. Not as much periwound tenderness. Dimensions measuring 2.7 x 3.3 x 1.6. She has undermining from 10-1 o'clock of about 1 and half centimeters. Necrotic fat on the surface of this... 08/13/2020 upon evaluation today patient appears to be doing decently well in regard to her wound all things considered. Obviously this is much more open than what was hoped for based on the fact that she had Sarah flap performed which was supposed to completely close this wound. Nonetheless currently she tells me that she is not really having any significant pain fortunately the drainage is improved compared to last time there is still some evidence of odor as far as the drainage is concerned but is definitely not as purulent as what was previously noted. With that being said I do believe that there are quite Sarah few sutures which are quite embedded. Nonetheless I believe this needs to be removed. In fact if they are not I am afraid this is again actually grow into the skin and that is can to be even bigger issue. Some of them  even so at this point appeared that there could be difficult to even get out. 8/17; this is the first time I am seeing this in about 3 weeks. The patient had stop by my office yesterday to discuss after completing hyperbaric therapy. Based on what she said I was expecting the worst today however apparently with dressing changes and perhaps debridement last week the surface of the wound actually looks better than what I was expecting. There is considerable depth although the last time I saw this the base of the wound was necrotic there is still some necrotic tissue on the surface of the wound bed however in general the rest the tissue looks healthy even with some bleeding. She has been using cleaning the wound with Dakin's and then applying Prisma and then backing Dakin's wet-to-dry. This seems to work to clean the wound surface up. They also ordered Sarah wound VAC and I  discussed this with her in depth today She is not systemically unwell. She is not running Sarah fever there is no odor. She talks about pain over the lower part of the flap to touch. This does not appear to be related to movement of her shoulder. 8/23; the patient has been using the wound VAC for 1 week. Already Sarah remarkable improvement and she appears to be tolerating this well we had Santyl under the wound VAC however the surface looks so clean today I am doubtful that is going to be necessary She is also being treated with HBO. This is helped her dramatically. Apparently her current certification will be up on 8/31. She is only had 19 dives and we will certainly need to recertify her up for this. Since I saw her last week I put in Sarah secure text message to Dr. Claudia Desanctis through epic to let him know that we were going ahead with Sarah wound VAC. He did not have any objections to this. He is offering her more surgery however in the eventuality that this does not close 8/30; the patient missed 2 treatments of HBO last week because of nausea. I think she is now on Compazine which seems to have helped. Otherwise she is tolerating HBO well. We are collagen under wound VAC. Direct depth of wound measured at 1.2 cm which I think is probably unchanged however the undermining area is down to 0.5 which I think is an improvement 9/6; patient continues with chemotherapy although she says she is troll of her nausea. We are using collagen with Sarah wound VAC on the remaining wound on her chest. No major improvements in the undermining area from 12-5 currently measuring 0.5 cm. This is no better than last week 9/13; patient continuing with chemotherapy. She completes HBO today. We are using silver collagen under wound VAC. No major improvements however the undermining area seems somewhat better to me. 9/20; we are using silver collagen under the wound VAC and although about 60% of the wound looks healthy. At the base of the wound  there is still Sarah very adherent slough. She has been approved for TheraSkin which I would like to use if we can get the wound bed in Sarah cleaner situation. 9/27; we are using Santyl under the wound VAC. Dimensions are slightly better including depth of 0.7 cm. Slight improvement in length and width the undermining area currently at 12-2 done in itself and improvement measures 0.6 which is not too much of improvement in terms of the probable depth 10/4; using Santyl under the wound VAC.  Comes in today with Sarah fairly dramatic improvement in surface area and marked improvement in the undermining area as well as condition of the wound bed. This is gratifying to see in view of this were going to continue the Santyl under the [facility] for another week. 10/11; patient presents for follow-up. She reports continued wound healing with Santyl and wound VAC. She has no issues or complaints today. She denies signs of infection. 10/17; although the wound appears to have contracted the measurements are not that much different and more problematically not much improvement in the depth and condition of the wound bed. She still has Sarah shallow undermining area perhaps it 0.3 that its been somewhat improved. She is using using Santyl under the wound VAC in preparation for Sarah trial of TheraSkin 10/27; wound looked quite good today. We went ahead with TheraSkin #1 today. The wound VAC can be discontinued 11/10; the TheraSkin was very adherent today. Very difficult to remove. Nevertheless the underlying tissue looked very healthy. Minimal undermining. TheraSkin #2 applied today 11/27/2020 upon evaluation today patient actually appears to be doing excellent in regard to her wound. We are using TheraSkin currently which seems to be doing great. With this is actually application #3. Overall the wound is significantly smaller and I think we are headed in the right direction. 12/8; TheraSkin #4. Really dramatic improvement I had not seen  this in about Sarah month. There is no depth Electronic Signature(s) Signed: 12/12/2020 4:05:05 PM By: [name] MD Entered By: [name] on 12/12/2020 15:26:18 -------------------------------------------------------------------------------- Physical Exam Details Patient Name: Date of Service: [name], LO Khan [name] 12/12/2020 2:45 PM Medical Record Number: [id number] Patient Account Number: [id number] Date of Birth/Sex: Treating RN: 08-29-1970 (50 y.o. F) Primary Care Provider: [name] Other Clinician: Referring Provider: Treating Provider/Extender: [name] in Treatment: 35 Constitutional Sitting or standing Blood Pressure is within target range for patient.. Pulse regular and within target range for patient.Marland Kitchen Respirations regular, non-labored and within target range.. Temperature is normal and within the target range for the patient.Marland Kitchen Appears in no distress. Notes Wound exam; very tiny wound just below the anterior axillary line. Dramatically better than when I last saw this Sarah month ago there is no depth no infection. TheraSkin applied in the standard fashion Electronic Signature(s) Signed: 12/12/2020 4:05:05 PM By: [name] MD Entered By: [name] on 12/12/2020 15:27:03 -------------------------------------------------------------------------------- Physician Orders Details Patient Name: Date of Service: [name] [name] 12/12/2020 2:45 PM Medical Record Number: [id number] Patient Account Number: [id number] Date of Birth/Sex: Treating RN: [birth year] (50 y.o. 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 - with Dr. Dellia Nims Return Sarah Nurse Visit: - 1 week post theraskin for outer dressing change Cellular or Tissue Based Products Cellular or Tissue Based Product Type: - 12/12/20 : Theraskin #4 daptic or Mepitel. (DO NOT REMOVE). - may Cellular or Tissue Based Product applied to wound bed, secured with steri-strips, cover with Sarah replace secondary gauze as needed for drainage Bathing/ Shower/ Hygiene May shower with  protection but do not get wound dressing(s) wet. Wound Treatment Wound #1 - Chest Wound Laterality: Left Cleanser: Wound Cleanser 1 x Per Week/15 Days Discharge Instructions: Cleanse the wound with wound cleanser prior to applying Sarah clean dressing using gauze sponges, not tissue or cotton balls. Prim Dressing: TheraSkin ary 1 x Per Week/15 Days Secondary Dressing: Woven Gauze Sponge, Non-Sterile 4x4 in 1 x Per Week/15 Days Discharge Instructions: Apply over primary dressing as directed. Secondary Dressing: Drawtex 4x4 in 1 x Per Week/15 Days Discharge Instructions: Apply over primary dressing as directed. Secondary Dressing: CarboFLEX Odor Control Dressing, 4x4 in 1 x Per Week/15 Days Discharge Instructions: Apply over primary dressing as directed. Secondary Dressing: ADAPTIC TOUCH 3x4.25 in 1 x Per Week/15 Days Discharge Instructions: Apply over primary dressing as directed. Secured With: Tegaderm Transparent Film Dressing 4x4.75 (in/in) 1 x Per Week/15 Days Discharge Instructions: Secure dressing with Tegaderm as directed. Electronic Signature(s) Signed: 12/12/2020 3:55:54 PM By: Deon Pilling RN, BSN Signed: 12/12/2020 4:05:05 PM By: [name] MD Entered By: Deon Pilling on 12/12/2020  15:22:31 -------------------------------------------------------------------------------- Problem List Details Patient Name: Date of Service: [name] 12/12/2020 2:45 PM Medical Record Number: [id number] Patient Account Number: [id number] Date of Birth/Sex: Treating RN: 06-03-1970 (50 y.o. 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 12/12/2020 2:45 PM Medical Record Number: [id number] Patient Account Number: [id number] Date of Birth/Sex: Treating RN: 09/22/1970 (50 y.o. F) Primary Care Provider: [name] Other Clinician: Referring Provider: Treating Provider/Extender: [name] in Treatment: 35 Subjective History of Present Illness (HPI) After canADMISSION 04/09/2020 [name] 50 year old woman who is history of breast cancer in the left breast goes back to 2018. He was found to have Sarah mass in her left breast. [name] and then subsequent chemo that she did not tolerate very well. She had seed implants placed but  then underwent external beam Sarah radiation in the spring 2019. 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 Her adjunctive radiation was from 03/11/2017 through 04/21/2017. Unfortunately she had Sarah left breast biopsy on 11/24/2019 and it was again positive for an invasive ductal carcinoma grade 3 he is to resume at receptor 80% positive. HER-2 negative progesterone negative. She opted for bilateral mastectomies which were done on 01/09/2020 on the right with no evidence of malignancy on the left stage IIb invasive ductal carcinoma. She again received chemotherapy but she did not tolerate it because of wound dehiscence and low platelet counts according to the patient. Unfortunately her mastectomy wound dehisced on the left breast. She was taken back to the OR by [name] of general surgery on 33/24/22 and she underwent Sarah debridement  and subsequent attempt at closure. She had been previously treated with Augmentin for suspected underlying infection. Once again the wound has dehisced in the mid aspect. It is roughly 1 cm deep there is tunnels both medially and laterally she. She still has sutures in place. Still describes constant pain drainage. She also has Sarah drain still in place that drains about 10 cc/day. Currently she is changing ABD pad several times Sarah day 4/12; patient went back to see [name] who remove most of the stitches and debrided her wound. She has been using silver alginate culture I did last time which was Sarah PCR culture showed Peptostreptococcus, Enterococcus faecalis and Staphylococcus epidermidis which is of questionable significance. [name] that the sutures have been removed. There are undermining areas superior medial superior lateral and Sarah deep tunneling area in the inferior part of the wound. There is no obvious purulence. I  started her on Augmentin on 04/12/2020 and I will likely extend this for another week AMAZINGLY; she is already been approved for HBO and will start today. [name] Echocardiogram which I reviewed from 2019 showed Sarah normal ejection fraction.. I went over side effects of HBO with the patient and answered any of her questions. Her husband was present 5/3; [name]. She went to see [name] about 2-1/2 weeks ago according to the patient he can remove some of the surrounding debris. She will see him again later this week or early next week. She has been using silver alginate. Sarah lot of this is problematic since she has Medicaid which traditionally does not cover wound care supplies although it traditionally does not cover HBO either Her wound actually looks quite Sarah bit better than the last time I saw this better looking surface healthy looking tissue. She has tunnels at 810 2 in 6 the deepest of these is at 2 at 2.2 cm there is no exposed bone. 5/10; the patient was coming in for nurse visit today but requested that I see the wound because of surface discoloration. She states the Iodoflex makes the surface of this turned "white". She is not really complaining of undue tenderness. She is changing this every second day. The goal of this had been to get the surface of the wound to Sarah vibrant red color and then potentially look at changing to Sarah silver collagen-based dressing, Puraply, and then perhaps an alternative skin substituteo Derma vest. The patient tells me she has an appoint with her oncologist [name] on the 19th. She apparently think she is going to get another PET scan. Based on this I would like his comment about whether there is possibility of any malignancy in the wound itself or just underneath it. Up to now we have not really thought that was true 5/17; nice improvement in the chest wound. Less depth. We have been using Iodoflex to  clean off the wound surface this seems to have done nicely. Patient went to see [name] 2 days ago who has referred her to see Dr. Claudia Desanctis. I certainly agree with the thought the surface of the wound looks Sarah lot better now and I think consideration of plastic surgery is certainly something the patient should discuss. I have talked to the patient about this today and I certainly support Sarah conversation with Dr. Claudia Desanctis. I also wondered about is there any residual cancer in this. [name] apparently did not seem to  think so per the patient although I do not see that in the text conversations we have had. I also wonder about Sarah skin substitute if her version of Medicare [[employer]] would cover this. [name] is apparently considering starting her on [place]. Patient is already very scared about the side effects but she will discuss this with [name] on 5/19 5/31; patient's wound about the same in terms of size she still has the tunneling area to the left. Surface of the wound looks Sarah lot better there has been some filling in the granulation I have been included on serial text between Dr. Callie Fielding surgery] Dr. Ruffin Frederick oncology] and Dr. Para Skeans surgery]. I think there is not Sarah lot of thought that the patient has recurrence of her cancer her MRI was indeterminant but [name] did not feel that there was any reason to think she has Sarah major recurrence and he was not in favor of doing Sarah dedicated breast MRI. Dr. Claudia Desanctis wants to go ahead with Sarah flap closure I did it. I have not reviewed his note but per the patient [name] latissimus dorsi myocutaneous flap or I think Sarah skin flap from her abdomen. I have talked to the patient I think it is time to attempt this. If I can continue hyperbarics surrounding the flap then we will attempt to do this 6/14; patient using Prisma as the primary dressing here. She has completed hyperbarics and did very well. Her wound is responded  nicely. The depth of this and the condition of the granulation tissue looks Sarah lot better. She has undermining areas at 2 and 10:00 both of these seem to be better as well. She is scheduled for flap closure on 7/11 by Dr. Claudia Desanctis 6/29; the patient continues to use Prisma wet-to-dry. Her graft is scheduled for 7/11 by Dr. Claudia Desanctis. We are looking at getting her additional hyperbaric treatment which I think might help with the likelihood of graft survival in this difficult irradiated area 7/21; patient returns to clinic to reinitiate HBO after her surgical procedure by Dr. Claudia Desanctis. On 07/15/2020 she had debridement of left chest wound and Sarah rotational flap closure. She also had excision of right excess axillary tissue. Pathology did not show any malignancy and follow-up Dr. Claudia Desanctis saw the patient yesterday. He noted Sarah 2 cm area of wound dehiscence. Recommended she return for hyperbaric oxygen and she returns today for her first treatment. Follow-up with him in 2 weeks. We were not actually going to see the patient today her appointment to be evaluated was Monday however she was concerned about Sarah "fishy odor". She is also noted some drainage. Not specifically dressing in the wound area but covering with Adaptic 7/25; I am seeing this wound and follow-up. So far her culture is polymicrobial not growing any specific anything specific nevertheless with the Augmentin and the silver alginate her odor is Sarah lot better. Not as much periwound tenderness. Dimensions measuring 2.7 x 3.3 x 1.6. She has undermining from 10-1 o'clock of about 1 and half centimeters. Necrotic fat on the surface of this... 08/13/2020 upon evaluation today patient appears to be doing decently well in regard to her wound all things considered. Obviously this is much more open than what was hoped for based on the fact that she had Sarah flap performed which was supposed to completely close this wound. Nonetheless currently she tells me that she is not really  having any significant pain fortunately the drainage is  improved compared to last time there is still some evidence of odor as far as the drainage is concerned but is definitely not as purulent as what was previously noted. With that being said I do believe that there are quite Sarah few sutures which are quite embedded. Nonetheless I believe this needs to be removed. In fact if they are not I am afraid this is again actually grow into the skin and that is can to be even bigger issue. Some of them even so at this point appeared that there could be difficult to even get out. 8/17; this is the first time I am seeing this in about 3 weeks. The patient had stop by my office yesterday to discuss after completing hyperbaric therapy. Based on what she said I was expecting the worst today however apparently with dressing changes and perhaps debridement last week the surface of the wound actually looks better than what I was expecting. There is considerable depth although the last time I saw this the base of the wound was necrotic there is still some necrotic tissue on the surface of the wound bed however in general the rest the tissue looks healthy even with some bleeding. She has been using cleaning the wound with Dakin's and then applying Prisma and then backing Dakin's wet-to-dry. This seems to work to clean the wound surface up. They also ordered Sarah wound VAC and I discussed this with her in depth today She is not systemically unwell. She is not running Sarah fever there is no odor. She talks about pain over the lower part of the flap to touch. This does not appear to be related to movement of her shoulder. 8/23; the patient has been using the wound VAC for 1 week. Already Sarah remarkable improvement and she appears to be tolerating this well we had Santyl under the wound VAC however the surface looks so clean today I am doubtful that is going to be necessary She is also being treated with HBO. This is helped her  dramatically. Apparently her current certification will be up on 8/31. She is only had 19 dives and we will certainly need to recertify her up for this. Since I saw her last week I put in Sarah secure text message to Dr. Claudia Desanctis through epic to let him know that we were going ahead with Sarah wound VAC. He did not have any objections to this. He is offering her more surgery however in the eventuality that this does not close 8/30; the patient missed 2 treatments of HBO last week because of nausea. I think she is now on Compazine which seems to have helped. Otherwise she is tolerating HBO well. We are collagen under wound VAC. Direct depth of wound measured at 1.2 cm which I think is probably unchanged however the undermining area is down to 0.5 which I think is an improvement 9/6; patient continues with chemotherapy although she says she is troll of her nausea. We are using collagen with Sarah wound VAC on the remaining wound on her chest. No major improvements in the undermining area from 12-5 currently measuring 0.5 cm. This is no better than last week 9/13; patient continuing with chemotherapy. She completes HBO today. We are using silver collagen under wound VAC. No major improvements however the undermining area seems somewhat better to me. 9/20; we are using silver collagen under the wound VAC and although about 60% of the wound looks healthy. At the base of the wound there is still  Sarah very adherent slough. She has been approved for TheraSkin which I would like to use if we can get the wound bed in Sarah cleaner situation. 9/27; we are using Santyl under the wound VAC. Dimensions are slightly better including depth of 0.7 cm. Slight improvement in length and width the undermining area currently at 12-2 done in itself and improvement measures 0.6 which is not too much of improvement in terms of the probable depth 10/4; using Santyl under the wound VAC. Comes in today with Sarah fairly dramatic improvement in surface  area and marked improvement in the undermining area as well as condition of the wound bed. This is gratifying to see in view of this were going to continue the Santyl under the [facility] for another week. 10/11; patient presents for follow-up. She reports continued wound healing with Santyl and wound VAC. She has no issues or complaints today. She denies signs of infection. 10/17; although the wound appears to have contracted the measurements are not that much different and more problematically not much improvement in the depth and condition of the wound bed. She still has Sarah shallow undermining area perhaps it 0.3 that its been somewhat improved. She is using using Santyl under the wound VAC in preparation for Sarah trial of TheraSkin 10/27; wound looked quite good today. We went ahead with TheraSkin #1 today. The wound VAC can be discontinued 11/10; the TheraSkin was very adherent today. Very difficult to remove. Nevertheless the underlying tissue looked very healthy. Minimal undermining. TheraSkin #2 applied today 11/27/2020 upon evaluation today patient actually appears to be doing excellent in regard to her wound. We are using TheraSkin currently which seems to be doing great. With this is actually application #3. Overall the wound is significantly smaller and I think we are headed in the right direction. 12/8; TheraSkin #4. Really dramatic improvement I had not seen this in about Sarah month. There is no depth Objective Constitutional Sitting or standing Blood Pressure is within target range for patient.. Pulse regular and within target range for patient.Marland Kitchen Respirations regular, non-labored and within target range.. Temperature is normal and within the target range for the patient.Marland Kitchen Appears in no distress. Vitals Time Taken: 3:00 PM, Height: 67 in, Weight: 257 lbs, BMI: 40.2, Temperature: 98.3 F, Pulse: 87 bpm, Respiratory Rate: 20 breaths/min, Blood Pressure: 104/70 mmHg. General Notes: Wound exam; very  tiny wound just below the anterior axillary line. Dramatically better than when I last saw this Sarah month ago there is no depth no infection. TheraSkin applied in the standard fashion Integumentary (Hair, Skin) Wound #1 status is Open. Original cause of wound was Surgical Injury. The date acquired was: 01/16/2020. The wound has been in treatment 35 weeks. The wound is located on the Left Chest. The wound measures 1.3cm length x 1cm width x 0.1cm depth; 1.021cm^2 area and 0.102cm^3 volume. There is Fat Layer (Subcutaneous Tissue) exposed. There is no tunneling or undermining noted. There is Sarah medium amount of serosanguineous drainage noted. The wound margin is distinct with the outline attached to the wound base. There is large (67-100%) red, hyper - granulation within the wound bed. There is no necrotic tissue within the wound bed. 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 Sarah skin graft procedure using Sarah bioengineered skin substitute/cellular or tissue based product was performed by Ricard Dillon., MD with the following instrument(s): Forceps and Scissors. Theraskin was applied and secured with Steri-Strips and adaptic. 6 sq cm of product was utilized and 0 sq cm was wasted. Post Application, no dressing was applied. Sarah Time Out was conducted at 15:15, prior to the start of the procedure. The procedure was tolerated well with Sarah pain level of 0 throughout and Sarah pain level of 0 following the procedure. Post procedure Diagnosis Wound #1: Same as Pre-Procedure . Plan Follow-up Appointments: Return Appointment in 2 weeks. -  with Dr. Dellia Nims Nurse Visit: - 1 week post theraskin for outer dressing change Cellular or Tissue Based Products: Cellular or Tissue Based Product Type: - 12/12/20 : Theraskin #4 Cellular or Tissue Based Product applied to wound bed, secured with steri-strips, cover with Adaptic or Mepitel. (DO NOT REMOVE). - may replace secondary gauze as needed for drainage Bathing/ Shower/ Hygiene: May shower with protection but do not get wound dressing(s) wet. WOUND #1: - Chest Wound Laterality: Left Cleanser: Wound Cleanser 1 x Per Week/15 Days Discharge Instructions: Cleanse the wound with wound cleanser prior to applying Sarah clean dressing using gauze sponges, not tissue or cotton balls. Prim Dressing: TheraSkin 1 x Per Week/15 Days ary Secondary Dressing: Woven Gauze Sponge, Non-Sterile 4x4 in 1 x Per Week/15 Days Discharge Instructions: Apply over primary dressing as directed. Secondary Dressing: Drawtex 4x4 in 1 x Per Week/15 Days Discharge Instructions: Apply over primary dressing as directed. Secondary Dressing: CarboFLEX Odor Control Dressing, 4x4 in 1 x Per Week/15 Days Discharge Instructions: Apply over primary dressing as directed. Secondary Dressing: ADAPTIC TOUCH 3x4.25 in 1 x Per Week/15 Days Discharge Instructions: Apply over primary dressing as directed. Secured With: Tegaderm Transparent Film Dressing 4x4.75 (in/in) 1 x Per Week/15 Days Discharge Instructions: Secure dressing with Tegaderm as directed. 1. Dramatic improvement in this patient's surgical wound 2. She tells [place] that she is in remission in terms of her cancer which is obviously great news 3. TheraSkin #4. This looks like it is close to fully Tax inspector) Signed: 12/12/2020 4:14:16 PM By: Deon Pilling RN, BSN Signed: 12/17/2020 4:30:32 PM By: [name] MD Previous Signature: 12/12/2020 4:05:05 PM Version By: [name] MD Entered By: Deon Pilling on 12/12/2020  16:13:56 -------------------------------------------------------------------------------- SuperBill Details Patient Name: Date of Service: [name] [name] 12/12/2020 Medical Record Number: [id number] Patient Account Number: [id number] Date of Birth/Sex: Treating RN: 01/26/1970 (50 y.o. 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 Facility Procedures CPT4 Code: [id number] Description: 15271 - SKIN SUB GRAFT TRNK/ARM/LEG ICD-10 Diagnosis Description T81.31XD Disruption of external operation (surgical) wound, not elsewhere classified, subs L59.8 Other specified disorders of the skin and subcutaneous tissue related to  radiatio Modifier: equent encounter n Quantity: 1 CPT4 Code: [id number] Description: C3762- Theraskin per 1sq cm extra small -6 sq cm Modifier: Quantity: 6 Physician Procedures : CPT4 Code Description Modifier [id number] - WC PHYS SKIN SUB GRAFT TRNK/ARM/LEG ICD-10 Diagnosis Description T81.31XD Disruption of external operation (surgical) wound, not  elsewhere classified, subsequent encounter L59.8 Other specified disorders  of the skin and subcutaneous tissue related to radiation Quantity: 1 Electronic Signature(s) Signed: 12/12/2020 3:55:54 PM By: Deon Pilling RN, BSN Signed: 12/12/2020 4:05:05 PM By: [name] MD Entered By: Deon Pilling on 12/12/2020 15:47:04

## 2020-12-12 NOTE — Progress Notes (Signed)
[name], [name] ([id number]) Visit Report for 12/12/2020 Arrival Information Details Patient Name: Date of Service: [name] [name] 12/12/2020 2:45 PM Medical Record Number: [id number] Patient Account Number: [id number] Date of Birth/Sex: Treating RN: [birth year] Khan (50 y.o. [name], [name] Primary Care Lavel Rieman: [name] Other Clinician: Referring [name]: Treating [name]/Extender: [name] in Treatment: 12 Visit Information History Since Last Visit Added or deleted any medications: No Patient Arrived: Ambulatory Any new allergies or adverse reactions: No Arrival Time: 15:03 Had Sarah fall or experienced change in No Accompanied By: husband activities of daily living that may affect Transfer Assistance: None risk of falls: Patient Identification Verified: Yes Signs or symptoms of abuse/neglect since last visito No Secondary Verification Process Completed: Yes Hospitalized since last visit: No Patient Requires Transmission-Based Precautions: No Implantable device outside of the clinic excluding No Patient Has Alerts: No cellular tissue based products placed in the center since last visit: Has Dressing in Place as Prescribed: Yes Pain Present Now: No Electronic Signature(s) Signed: 12/12/2020 3:55:54 PM By: Deon Pilling RN, BSN Entered By: Deon Pilling on 12/12/2020 15:04:03 -------------------------------------------------------------------------------- Encounter Discharge Information Details Patient Name: Date of Service: [name] [name] 12/12/2020 2:45 PM Medical Record Number: [id number] Patient Account Number: [id number] Date of Birth/Sex: Treating RN: [birth year] Khan (50 y.o. [name] Primary Care [name]: [name] Other Clinician: Referring [name]: Treating [name] in Treatment: 35 Encounter Discharge Information Items Post Procedure Vitals Discharge  Condition: Stable Temperature (F): 98.3 Ambulatory Status: Ambulatory Pulse (bpm): 87 Discharge Destination: Home Respiratory Rate (breaths/min): 20 Transportation: Private Auto Blood Pressure (mmHg): 104/70 Accompanied By: husband Schedule Follow-up Appointment: Yes Clinical Summary of Care: Electronic Signature(s) Signed: 12/12/2020 3:55:54 PM By: Deon Pilling RN, BSN Entered By: Deon Pilling on 12/12/2020 15:47:52 -------------------------------------------------------------------------------- Lower Extremity Assessment Details Patient Name: Date of Service: Sarah [name] [name] 12/12/2020 2:45 PM Medical Record Number: [id number] Patient Account Number: [id number] Date of Birth/Sex: Treating RN: [name] (50 y.o. [name] Primary Care [name]: [name] Other Clinician: Referring [name]: Treating [name]/Extender: [name] in Treatment: 35 Electronic Signature(s) Signed: 12/12/2020 3:55:54 PM By: Deon Pilling RN, BSN Entered By: Deon Pilling on 12/12/2020 15:04:30 -------------------------------------------------------------------------------- Multi Wound Chart Details Patient Name: Date of Service: [name] [name] 12/12/2020 2:45 PM Medical Record Number: [id number] Patient Account Number: [id number] Date of Birth/Sex: Treating RN: [name] (50 y.o. 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)] [N/Sarah:N/Sarah] Secondary Etiology:  [1:Hypertension, Osteoarthritis, ReceivedN/Sarah] Comorbid History: [1:Chemotherapy, Received Radiation 01/16/2020] [N/Sarah:N/Sarah] Date Acquired: [1:35] [N/Sarah:N/Sarah] Weeks of Treatment: [1:Open] [N/Sarah:N/Sarah] Wound Status: [1:1.3x1x0.1] [N/Sarah:N/Sarah] Measurements L x W x D (cm) [1:1.021] [N/Sarah:N/Sarah] Sarah (cm) : rea [1:0.102] [N/Sarah:N/Sarah] Volume (cm) : [1:84.00%] [N/Sarah:N/Sarah] % Reduction in Area: [1:98.50%] [N/Sarah:N/Sarah] % Reduction in Volume: [1:Full Thickness Without Exposed] [N/Sarah:N/Sarah] Classification: [1:Support Structures Medium] [N/Sarah:N/Sarah] Exudate Amount: [1:Serosanguineous] [N/Sarah:N/Sarah] Exudate Type: [1:red, brown] [N/Sarah:N/Sarah] Exudate Color: [1:Distinct, outline attached] [N/Sarah:N/Sarah] Wound Margin: [1:Large (67-100%)] [N/Sarah:N/Sarah] Granulation Amount: [1:Red, Hyper-granulation] [N/Sarah:N/Sarah] Granulation Quality: [1:None Present (0%)] [N/Sarah:N/Sarah] Necrotic Amount: [1:Fat Layer (Subcutaneous Tissue): Yes N/Sarah] Exposed Structures: [  1:Fascia: No Tendon: No Muscle: No Joint: No Bone: No Medium (34-66%)] [N/Sarah:N/Sarah] Epithelialization: [1:Cellular or Tissue Based Product] [N/Sarah:N/Sarah] Procedures Performed: Treatment Notes Electronic Signature(s) Signed: 12/12/2020 4:05:05 PM By: Sarah Ham MD Entered By: [name] on 12/12/2020 15:25:19 -------------------------------------------------------------------------------- Multi-Disciplinary Care Plan Details Patient Name: Date of Service: [name] [name] 12/12/2020 2:45 PM Medical Record Number: [id number] Patient Account Number: [id number] Date of Birth/Sex: Treating RN: [birth year] (50 y.o. [name] Primary Care Monzerat Handler: [name] Other Clinician: Referring [name]: Treating [name] in Treatment: 35 Active Inactive Wound/Skin Impairment Nursing Diagnoses: Impaired tissue integrity Knowledge deficit related to smoking impact on wound healing Knowledge deficit related to ulceration/compromised skin  integrity Goals: Patient will demonstrate Sarah reduced rate of smoking or cessation of smoking Date Initiated: 04/09/2020 Date Inactivated: 06/18/2020 Target Resolution Date: 06/28/2020 Goal Status: Met Patient will have Sarah decrease in wound volume by X% from date: (specify in notes) Date Initiated: 04/09/2020 Date Inactivated: 07/03/2020 Target Resolution Date: 07/05/2020 Goal Status: Met Patient/caregiver will verbalize understanding of skin care regimen Date Initiated: 04/09/2020 Target Resolution Date: 12/25/2020 Goal Status: Active Ulcer/skin breakdown will have Sarah volume reduction of 30% by week 4 Date Initiated: 04/09/2020 Date Inactivated: 05/14/2020 Target Resolution Date: 05/11/2020 Unmet Reason: see wound Goal Status: Unmet meaurements. Interventions: Assess patient/caregiver ability to obtain necessary supplies Assess patient/caregiver ability to perform ulcer/skin care regimen upon admission and as needed Assess ulceration(s) every visit Provide education on smoking Notes: Electronic Signature(s) Signed: 12/12/2020 3:55:54 PM By: Deon Pilling RN, BSN Entered By: Deon Pilling on 12/12/2020 15:09:04 -------------------------------------------------------------------------------- Pain Assessment Details Patient Name: Date of Service: [name] [name] 12/12/2020 2:45 PM Medical Record Number: [id number] Patient Account Number: [id number] Date of Birth/Sex: Treating RN: [birth year] (50 y.o. [name] Primary Care Riddick Nuon: Other Clinician: Karle Khan Referring Colby Reels: Treating Sheriff Rodenberg/Extender: [name] in Treatment: 35 Active Problems Location of Pain Severity and Description of Pain Patient Has Paino No Site Locations Rate the pain. Current Pain Level: 0 Pain Management and Medication Current Pain Management: Medication: No Cold Application: No Rest: No Massage: No Activity: No T.E.N.S.: No Heat Application: No Leg  drop or elevation: No Is the Current Pain Management Adequate: Adequate How does your wound impact your activities of daily livingo Sleep: No Bathing: No Appetite: No Relationship With Others: No Bladder Continence: No Emotions: No Bowel Continence: No Work: No Toileting: No Drive: No Dressing: No Hobbies: No Engineer, maintenance) Signed: 12/12/2020 3:55:54 PM By: Deon Pilling RN, BSN Entered By: Deon Pilling on 12/12/2020 15:04:25 -------------------------------------------------------------------------------- Patient/Caregiver Education Details Patient Name: Date of Service: [name] 12/8/2022andnbsp2:45 PM Medical Record Number: [id number] Patient Account Number: [id number] Date of Birth/Gender: Treating RN: [birth year] (50 y.o. [name] Primary Care Physician: [name] Other Clinician: Referring Physician: Treating Physician/Extender: [name] in Treatment: 50 Education Assessment Education Provided To: Patient and Caregiver Education Topics Provided Wound/Skin Impairment: Handouts: Skin Care Do's and Dont's Methods: Explain/Verbal Responses: Reinforcements needed Electronic Signature(s) Signed: 12/12/2020 3:55:54 PM By: Deon Pilling RN, BSN Entered By: Deon Pilling on 12/12/2020 15:09:16 -------------------------------------------------------------------------------- Wound Assessment Details Patient Name: Date of Service: [name] [name] 12/12/2020 2:45 PM Medical Record Number: [id number] Patient Account Number: [id number] Date of Birth/Sex: Treating RN: [birth year] (50 y.o. [name] Primary Care Alee Gressman: [name] Other Clinician: Referring Adilyn Humes: Treating Davaughn Hillyard/Extender: [name]  [name] Weeks in Treatment: 35 Wound Status Wound Number: 1 Primary Open Surgical Wound Etiology: Wound Location: Left Chest Secondary Necrosis (Radiation) Wounding  Event: Surgical Injury Etiology: Date Acquired: 01/16/2020 Wound Status: Open Weeks Of Treatment: 35 Comorbid Hypertension, Osteoarthritis, Received Chemotherapy, Clustered Wound: No History: Received Radiation Photos Photo Uploaded By: Deon Pilling on 12/12/2020 15:53:47 Wound Measurements Length: (cm) 1.3 Width: (cm) 1 Depth: (cm) 0.1 Area: (cm) 1.021 Volume: (cm) 0.102 % Reduction in Area: 84% % Reduction in Volume: 98.5% Epithelialization: Medium (34-66%) Tunneling: No Undermining: No Wound Description Classification: Full Thickness Without Exposed Support Structures Wound Margin: Distinct, outline attached Exudate Amount: Medium Exudate Type: Serosanguineous Exudate Color: red, brown Foul Odor After Cleansing: No Slough/Fibrino No Wound Bed Granulation Amount: Large (67-100%) Exposed Structure Granulation Quality: Red, Hyper-granulation Fascia Exposed: No Necrotic Amount: None Present (0%) Fat Layer (Subcutaneous Tissue) Exposed: Yes Tendon Exposed: No Muscle Exposed: No Joint Exposed: No Bone Exposed: No Treatment Notes Wound #1 (Chest) Wound Laterality: Left Cleanser Wound Cleanser Discharge Instruction: Cleanse the wound with wound cleanser prior to applying Sarah clean dressing using gauze sponges, not tissue or cotton balls. Peri-Wound Care Topical Primary Dressing TheraSkin Secondary Dressing Woven Gauze Sponge, Non-Sterile 4x4 in Discharge Instruction: Apply over primary dressing as directed. Drawtex 4x4 in Discharge Instruction: Apply over primary dressing as directed. CarboFLEX Odor Control Dressing, 4x4 in Discharge Instruction: Apply over primary dressing as directed. ADAPTIC TOUCH 3x4.25 in Discharge Instruction: Apply over primary dressing as directed. Secured With Tegaderm Transparent Film Dressing 4x4.75 (in/in) Discharge Instruction: Secure dressing with Tegaderm as directed. Compression Wrap Compression Stockings Add-Ons Electronic  Signature(s) Signed: 12/12/2020 3:55:54 PM By: Deon Pilling RN, BSN Entered By: Deon Pilling on 12/12/2020 15:08:42 -------------------------------------------------------------------------------- Vitals Details Patient Name: Date of Service: [name] [name] 12/12/2020 2:45 PM Medical Record Number: [id number] Patient Account Number: [id number] Date of Birth/Sex: Treating RN: [birth year] (50 y.o. [name], [name] Primary Care Kelven Flater: [name] Other Clinician: Referring Brianne Maina: Treating Duwane Gewirtz/Extender: [name] in Treatment: 35 Vital Signs Time Taken: 15:00 Temperature (F): 98.3 Height (in): 67 Pulse (bpm): 87 Weight (lbs): 257 Respiratory Rate (breaths/min): 20 Body Mass Index (BMI): 40.2 Blood Pressure (mmHg): 104/70 Reference Range: 80 - 120 mg / dl Electronic Signature(s) Signed: 12/12/2020 3:55:54 PM By: Deon Pilling RN, BSN Entered By: Deon Pilling on 12/12/2020 15:04:17

## 2020-12-16 ENCOUNTER — Telehealth: Payer: Self-pay

## 2020-12-16 NOTE — Telephone Encounter (Signed)
Spoke with [name] this afternoon, reviewed her recent ultrasound. Per Dr. Berline Lopes Ultrasound is normal. Patient verbalized understanding and happy with news. Instructed to call with any questions or concerns.

## 2020-12-19 ENCOUNTER — Encounter (HOSPITAL_BASED_OUTPATIENT_CLINIC_OR_DEPARTMENT_OTHER): Payer: Medicaid Other | Admitting: Internal Medicine

## 2020-12-26 ENCOUNTER — Encounter (HOSPITAL_BASED_OUTPATIENT_CLINIC_OR_DEPARTMENT_OTHER): Payer: Medicaid Other | Admitting: Internal Medicine

## 2020-12-26 ENCOUNTER — Other Ambulatory Visit: Payer: Self-pay

## 2020-12-26 DIAGNOSIS — T8131XD Disruption of external operation (surgical) wound, not elsewhere classified, subsequent encounter: Secondary | ICD-10-CM | POA: Diagnosis not present

## 2020-12-26 NOTE — Progress Notes (Signed)
[name], [name] ([id number]) Visit Report for 12/26/2020 Arrival Information Details Patient Name: Date of Service: [name] 12/26/2020 1:30 PM Medical Record Number: [id number] Patient Account Number: [id number] Date of Birth/Sex: Treating RN: 1970-09-25 (50 y.o. [name], [name] Primary Care Generoso Cropper: [name] Other Clinician: Referring Xylah Early: Treating Khyli Swaim/Extender: [name] in Treatment: 53 Visit Information History Since Last Visit Added or deleted any medications: No Patient Arrived: Ambulatory Any new allergies or adverse reactions: No Arrival Time: 13:55 Had a fall or experienced change in No Accompanied By: husband activities of daily living that may affect Transfer Assistance: None risk of falls: Patient Identification Verified: Yes Signs or symptoms of abuse/neglect since last visito No Secondary Verification Process Completed: Yes Hospitalized since last visit: No Patient Requires Transmission-Based Precautions: No Implantable device outside of the clinic excluding No Patient Has Alerts: No cellular tissue based products placed in the center since last visit: Has Dressing in Place as Prescribed: Yes Pain Present Now: No Electronic Signature(s) Signed: 12/26/2020 5:08:09 PM By: Deon Pilling RN, BSN Entered By: Deon Pilling on 12/26/2020 13:55:49 -------------------------------------------------------------------------------- Lower Extremity Assessment Details Patient Name: Date of Service: [name], Sarah V [name] 12/26/2020 1:30 PM Medical Record Number: [id number] Patient Account Number: [id number] Date of Birth/Sex: Treating RN: 04-May-1970 (50 y.o. [name] Primary Care Alias Villagran: [name] Other Clinician: Referring Saryiah Bencosme: Treating Ahlia Lemanski/Extender: [name] in Treatment: 66 Electronic Signature(s) Signed: 12/26/2020 5:08:09 PM By: Deon Pilling  RN, BSN Entered By: Deon Pilling on 12/26/2020 13:56:34 -------------------------------------------------------------------------------- Multi Wound Chart Details Patient Name: Date of Service: [name], Sarah V [name] 12/26/2020 1:30 PM Medical Record Number: [id number] Patient Account Number: [id number] Date of Birth/Sex: Treating RN: 02-07-1970 (50 y.o. [name] Primary Care Ellsworth Waldschmidt: [name] Other Clinician: Referring Tenoch Mcclure: Treating Mayelin Panos/Extender: [name] in Treatment: 37 Vital Signs Height(in): 67 Pulse(bpm): 79 Weight(lbs): 433 Blood Pressure(mmHg): 123/84 Body Mass Index(BMI): 40 Temperature(F): 98.3 Respiratory Rate(breaths/min): 20 Photos: [N/A:N/A] Left Chest N/A N/A Wound Location: Surgical Injury N/A N/A Wounding Event: Open Surgical Wound N/A N/A Primary Etiology: Necrosis (Radiation) N/A N/A Secondary Etiology: Hypertension, Osteoarthritis, ReceivedN/A N/A Comorbid History: Chemotherapy, Received Radiation 01/16/2020 N/A N/A Date Acquired: 43 N/A N/A Weeks of Treatment: Open N/A N/A Wound Status: 0.2x0.2x0.1 N/A N/A Measurements L x W x D (cm) 0.031 N/A N/A A (cm) : rea 0.003 N/A N/A Volume (cm) : 99.50% N/A N/A % Reduction in Area: 100.00% N/A N/A % Reduction in Volume: Full Thickness Without Exposed N/A N/A Classification: Support Structures None Present N/A N/A Exudate Amount: Distinct, outline attached N/A N/A Wound Margin: None Present (0%) N/A N/A Granulation Amount: None Present (0%) N/A N/A Necrotic Amount: Fat Layer (Subcutaneous Tissue): Yes N/A N/A Exposed Structures: Fascia: No Tendon: No Muscle: No Joint: No Bone: No Large (67-100%) N/A N/A Epithelialization: Treatment Notes Electronic Signature(s) Signed: 12/26/2020 3:43:54 PM By: [name] MD Signed: 12/26/2020 5:08:09 PM By: Deon Pilling RN, BSN Entered By: [name] on 12/26/2020  14:13:20 -------------------------------------------------------------------------------- Multi-Disciplinary Care Plan Details Patient Name: Date of Service: [name], Sarah V [name] 12/26/2020 1:30 PM Medical Record Number: [id number] Patient Account Number: [id number] Date of Birth/Sex: Treating RN: 1970/03/14 (50 y.o. [name] Primary Care Isay Perleberg: [name] Other Clinician: Referring Lanise Mergen: Treating Ailene Royal/Extender: [name] in Treatment: 37 Active Inactive Wound/Skin Impairment Nursing Diagnoses: Impaired tissue integrity Knowledge deficit related  to smoking impact on wound healing Knowledge deficit related to ulceration/compromised skin integrity Goals: Patient will demonstrate a reduced rate of smoking or cessation of smoking Date Initiated: 04/09/2020 Date Inactivated: 06/18/2020 Target Resolution Date: 06/28/2020 Goal Status: Met Patient will have a decrease in wound volume by X% from date: (specify in notes) Date Initiated: 04/09/2020 Date Inactivated: 07/03/2020 Target Resolution Date: 07/05/2020 Goal Status: Met Patient/caregiver will verbalize understanding of skin care regimen Date Initiated: 04/09/2020 Target Resolution Date: 01/03/2021 Goal Status: Active Ulcer/skin breakdown will have a volume reduction of 30% by week 4 Date Initiated: 04/09/2020 Date Inactivated: 05/14/2020 Target Resolution Date: 05/11/2020 Unmet Reason: see wound Goal Status: Unmet meaurements. Interventions: Assess patient/caregiver ability to obtain necessary supplies Assess patient/caregiver ability to perform ulcer/skin care regimen upon admission and as needed Assess ulceration(s) every visit Provide education on smoking Notes: Electronic Signature(s) Signed: 12/26/2020 5:08:09 PM By: Deon Pilling RN, BSN Entered By: Deon Pilling on 12/26/2020 14:06:23 -------------------------------------------------------------------------------- Pain  Assessment Details Patient Name: Date of Service: [name], Sarah V [name] 12/26/2020 1:30 PM Medical Record Number: [id number] Patient Account Number: [id number] Date of Birth/Sex: Treating RN: 04/10/1970 (50 y.o. [name] Primary Care Amanat Hackel: [name] Other Clinician: Referring Jaece Ducharme: Treating Darci Lykins/Extender: [name] in Treatment: 37 Active Problems Location of Pain Severity and Description of Pain Patient Has Paino No Site Locations Rate the pain. Current Pain Level: 0 Pain Management and Medication Current Pain Management: Medication: No Cold Application: No Rest: No Massage: No Activity: No T.E.N.S.: No Heat Application: No Leg drop or elevation: No Is the Current Pain Management Adequate: Adequate How does your wound impact your activities of daily livingo Sleep: No Bathing: No Appetite: No Relationship With Others: No Bladder Continence: No Emotions: No Bowel Continence: No Work: No Toileting: No Drive: No Dressing: No Hobbies: No Engineer, maintenance) Signed: 12/26/2020 5:08:09 PM By: Deon Pilling RN, BSN Entered By: Deon Pilling on 12/26/2020 13:56:28 -------------------------------------------------------------------------------- Patient/Caregiver Education Details Patient Name: Date of Service: [name] [name] 12/22/2022andnbsp1:30 PM Medical Record Number: [id number] Patient Account Number: [id number] Date of Birth/Gender: Treating RN: [birth year] (50 y.o. [name] Primary Care Physician: [name] Other Clinician: Referring Physician: Treating Physician/Extender: [name] in Treatment: 69 Education Assessment Education Provided To: Patient Education Topics Provided Wound/Skin Impairment: Handouts: Skin Care Do's and Dont's Methods: Explain/Verbal Responses: Reinforcements needed Electronic Signature(s) Signed: 12/26/2020 5:08:09  PM By: Deon Pilling RN, BSN Entered By: Deon Pilling on 12/26/2020 14:05:42 -------------------------------------------------------------------------------- Wound Assessment Details Patient Name: Date of Service: [name] V [name] 12/26/2020 1:30 PM Medical Record Number: [id number] Patient Account Number: [id number] Date of Birth/Sex: Treating RN: [birth year] (50 y.o. [name], [name] Primary Care Laberta Wilbon: [name] Other Clinician: Referring [place] Pelto: Treating Chondra Boyde/Extender: [name] in Treatment: 37 Wound Status Wound Number: 1 Primary Open Surgical Wound Etiology: Wound Location: Left Chest Secondary Necrosis (Radiation) Wounding Event: Surgical Injury Etiology: Date Acquired: 01/16/2020 Wound Status: Open Weeks Of Treatment: 37 Comorbid Hypertension, Osteoarthritis, Received Chemotherapy, Clustered Wound: No History: Received Radiation Photos Wound Measurements Length: (cm) 0.2 Width: (cm) 0.2 Depth: (cm) 0.1 Area: (cm) 0.031 Volume: (cm) 0.003 % Reduction in Area: 99.5% % Reduction in Volume: 100% Epithelialization: Large (67-100%) Tunneling: No Undermining: No Wound Description Classification: Full Thickness Without Exposed Support Structures Wound Margin: Distinct, outline attached Exudate Amount: None Present Foul Odor After Cleansing: No Slough/Fibrino No Wound Bed Granulation Amount: None  Present (0%) Exposed Structure Necrotic Amount: None Present (0%) Fascia Exposed: No Fat Layer (Subcutaneous Tissue) Exposed: Yes Tendon Exposed: No Muscle Exposed: No Joint Exposed: No Bone Exposed: No Electronic Signature(s) Signed: 12/26/2020 5:08:09 PM By: Deon Pilling RN, BSN Entered By: Deon Pilling on 12/26/2020 14:01:03 -------------------------------------------------------------------------------- Vitals Details Patient Name: Date of Service: [name], Sarah V [name] 12/26/2020 1:30 PM Medical  Record Number: [id number] Patient Account Number: [id number] Date of Birth/Sex: Treating RN: Aug 29, 1970 (50 y.o. [name], [name] Primary Care Jermarion Poffenberger: [name] Other Clinician: Referring Trysten Bernard: Treating Odette Watanabe/Extender: [name] in Treatment: 37 Vital Signs Time Taken: 13:55 Temperature (F): 98.3 Height (in): 67 Pulse (bpm): 79 Weight (lbs): 257 Respiratory Rate (breaths/min): 20 Body Mass Index (BMI): 40.2 Blood Pressure (mmHg): 123/84 Reference Range: 80 - 120 mg / dl Electronic Signature(s) Signed: 12/26/2020 5:08:09 PM By: Deon Pilling RN, BSN Entered By: Deon Pilling on 12/26/2020 13:56:16

## 2020-12-26 NOTE — Progress Notes (Signed)
[name], ART ([id number]) Visit Report for 12/26/2020 HPI Details Patient Name: Date of Service: [name] [name] 12/26/2020 1:30 PM Medical Record Number: [id number] Patient Account Number: [id number] Date of Birth/Sex: Treating RN: Oct 17, 1970 (50 y.o. [name] Primary Care Provider: [name] Other Clinician: Referring Provider: Treating Provider/Extender: [name] in Treatment: 37 History of Present Illness HPI Description: After canADMISSION 04/09/2020 [name] 50 year old woman who is history of breast cancer in the left breast goes back to 2018. He was found to have Sarah mass in her left breast. [name] and then subsequent chemo that she did not tolerate very well. She had seed implants placed but then underwent external beam Sarah radiation in the spring 2019. 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 Her adjunctive radiation was from 03/11/2017 through 04/21/2017. Unfortunately she had Sarah left breast biopsy on 11/24/2019 and it was again positive for an invasive ductal carcinoma grade 3 he is to resume at receptor 80% positive. HER-2 negative progesterone negative. She opted for bilateral mastectomies which were done on 01/09/2020 on the right with no evidence of malignancy on the left stage IIb invasive ductal carcinoma. She again received chemotherapy but she did not tolerate it because of wound dehiscence and low platelet counts according to the patient. Unfortunately her mastectomy wound dehisced on the left breast. She was taken back to the OR by [name] of general surgery on 33/24/22 and she underwent Sarah debridement and subsequent attempt at closure. She had been previously treated with Augmentin for suspected underlying infection. Once again the wound has dehisced in  the mid aspect. It is roughly 1 cm deep there is tunnels both medially and laterally she. She still has sutures in place. Still describes constant pain drainage. She also has Sarah drain still in place that drains about 10 cc/day. Currently she is changing ABD pad several times Sarah day 4/12; patient went back to see [name] who remove most of the stitches and debrided her wound. She has been using silver alginate culture I did last time which was Sarah PCR culture showed Peptostreptococcus, Enterococcus faecalis and Staphylococcus epidermidis which is of questionable significance. [name] that the sutures have been removed. There are undermining areas superior medial superior lateral and Sarah deep tunneling area in the inferior part of the wound. There is no obvious purulence. I started her on Augmentin on 04/12/2020 and I will likely extend this for another week AMAZINGLY; she is already been approved for HBO and will start today. [name] Echocardiogram which I reviewed from 2019 showed Sarah normal ejection fraction.. I went over side effects of HBO with the patient and answered any of her questions. Her husband was present 5/3; [name]. She went to see [name] about 2-1/2 weeks ago according to the patient he can remove some of the surrounding debris. She will see him again later this week or early next week. She has been using silver alginate. Sarah lot of this is problematic since she has Medicaid which traditionally does not cover wound care supplies although it traditionally does not cover HBO either Her wound actually looks quite Sarah bit better than the last time I saw this better looking surface healthy looking  tissue. She has tunnels at 810 2 in 6 the deepest of these is at 2 at 2.2 cm there is no exposed bone. 5/10; the patient was coming in for nurse visit today but requested that I see the wound because of surface  discoloration. She states the Iodoflex makes the surface of this turned "white". She is not really complaining of undue tenderness. She is changing this every second day. The goal of this had been to get the surface of the wound to Sarah vibrant red color and then potentially look at changing to Sarah silver collagen-based dressing, Puraply, and then perhaps an alternative skin substituteo Derma vest. The patient tells me she has an appoint with her oncologist [name] on the 19th. She apparently think she is going to get another PET scan. Based on this I would like his comment about whether there is possibility of any malignancy in the wound itself or just underneath it. Up to now we have not really thought that was true 5/17; nice improvement in the chest wound. Less depth. We have been using Iodoflex to clean off the wound surface this seems to have done nicely. Patient went to see [name] 2 days ago who has referred her to see Dr. Claudia Desanctis. I certainly agree with the thought the surface of the wound looks Sarah lot better now and I think consideration of plastic surgery is certainly something the patient should discuss. I have talked to the patient about this today and I certainly support Sarah conversation with Dr. Claudia Desanctis. I also wondered about is there any residual cancer in this. [name] apparently did not seem to think so per the patient although I do not see that in the text conversations we have had. I also wonder about Sarah skin substitute if her version of Medicare [[employer]] would cover this. [name] is apparently considering starting her on [place]. Patient is already very scared about the side effects but she will discuss this with [name] on 5/19 5/31; patient's wound about the same in terms of size she still has the tunneling area to the left. Surface of the wound looks Sarah lot better there has been some filling in the granulation I have been included on serial text between Dr. Callie Fielding surgery] Dr. Ruffin Frederick oncology] and Dr. Para Skeans surgery]. I think there is not Sarah lot of thought that the patient has recurrence of her cancer her MRI was indeterminant but [name] did not feel that there was any reason to think she has Sarah major recurrence and he was not in favor of doing Sarah dedicated breast MRI. Dr. Claudia Desanctis wants to go ahead with Sarah flap closure I did it. I have not reviewed his note but per the patient [name] latissimus dorsi myocutaneous flap or I think Sarah skin flap from her abdomen. I have talked to the patient I think it is time to attempt this. If I can continue hyperbarics surrounding the flap then we will attempt to do this 6/14; patient using Prisma as the primary dressing here. She has completed hyperbarics and did very well. Her wound is responded nicely. The depth of this and the condition of the granulation tissue looks Sarah lot better. She has undermining areas at 2 and 10:00 both of these seem to be better as well. She is scheduled for flap closure on 7/11 by Dr. Claudia Desanctis 6/29; the patient continues to use Prisma wet-to-dry. Her graft is scheduled for 7/11 by  Dr. Claudia Desanctis. We are looking at getting her additional hyperbaric treatment which I think might help with the likelihood of graft survival in this difficult irradiated area 7/21; patient returns to clinic to reinitiate HBO after her surgical procedure by Dr. Claudia Desanctis. On 07/15/2020 she had debridement of left chest wound and Sarah rotational flap closure. She also had excision of right excess axillary tissue. Pathology did not show any malignancy and follow-up Dr. Claudia Desanctis saw the patient yesterday. He noted Sarah 2 cm area of wound dehiscence. Recommended she return for hyperbaric oxygen and she returns today for her first treatment. Follow-up with him in 2 weeks. We were not actually going to see the patient today her appointment to be evaluated was Monday however she was concerned about Sarah "fishy odor". She is  also noted some drainage. Not specifically dressing in the wound area but covering with Adaptic 7/25; I am seeing this wound and follow-up. So far her culture is polymicrobial not growing any specific anything specific nevertheless with the Augmentin and the silver alginate her odor is Sarah lot better. Not as much periwound tenderness. Dimensions measuring 2.7 x 3.3 x 1.6. She has undermining from 10-1 o'clock of about 1 and half centimeters. Necrotic fat on the surface of this... 08/13/2020 upon evaluation today patient appears to be doing decently well in regard to her wound all things considered. Obviously this is much more open than what was hoped for based on the fact that she had Sarah flap performed which was supposed to completely close this wound. Nonetheless currently she tells me that she is not really having any significant pain fortunately the drainage is improved compared to last time there is still some evidence of odor as far as the drainage is concerned but is definitely not as purulent as what was previously noted. With that being said I do believe that there are quite Sarah few sutures which are quite embedded. Nonetheless I believe this needs to be removed. In fact if they are not I am afraid this is again actually grow into the skin and that is can to be even bigger issue. Some of them even so at this point appeared that there could be difficult to even get out. 8/17; this is the first time I am seeing this in about 3 weeks. The patient had stop by my office yesterday to discuss after completing hyperbaric therapy. Based on what she said I was expecting the worst today however apparently with dressing changes and perhaps debridement last week the surface of the wound actually looks better than what I was expecting. There is considerable depth although the last time I saw this the base of the wound was necrotic there is still some necrotic tissue on the surface of the wound bed however in  general the rest the tissue looks healthy even with some bleeding. She has been using cleaning the wound with Dakin's and then applying Prisma and then backing Dakin's wet-to-dry. This seems to work to clean the wound surface up. They also ordered Sarah wound VAC and I discussed this with her in depth today She is not systemically unwell. She is not running Sarah fever there is no odor. She talks about pain over the lower part of the flap to touch. This does not appear to be related to movement of her shoulder. 8/23; the patient has been using the wound VAC for 1 week. Already Sarah remarkable improvement and she appears to be tolerating this well we had Santyl under  the wound VAC however the surface looks so clean today I am doubtful that is going to be necessary She is also being treated with HBO. This is helped her dramatically. Apparently her current certification will be up on 8/31. She is only had 19 dives and we will certainly need to recertify her up for this. Since I saw her last week I put in Sarah secure text message to Dr. Claudia Desanctis through epic to let him know that we were going ahead with Sarah wound VAC. He did not have any objections to this. He is offering her more surgery however in the eventuality that this does not close 8/30; the patient missed 2 treatments of HBO last week because of nausea. I think she is now on Compazine which seems to have helped. Otherwise she is tolerating HBO well. We are collagen under wound VAC. Direct depth of wound measured at 1.2 cm which I think is probably unchanged however the undermining area is down to 0.5 which I think is an improvement 9/6; patient continues with chemotherapy although she says she is troll of her nausea. We are using collagen with Sarah wound VAC on the remaining wound on her chest. No major improvements in the undermining area from 12-5 currently measuring 0.5 cm. This is no better than last week 9/13; patient continuing with chemotherapy. She completes  HBO today. We are using silver collagen under wound VAC. No major improvements however the undermining area seems somewhat better to me. 9/20; we are using silver collagen under the wound VAC and although about 60% of the wound looks healthy. At the base of the wound there is still Sarah very adherent slough. She has been approved for TheraSkin which I would like to use if we can get the wound bed in Sarah cleaner situation. 9/27; we are using Santyl under the wound VAC. Dimensions are slightly better including depth of 0.7 cm. Slight improvement in length and width the undermining area currently at 12-2 done in itself and improvement measures 0.6 which is not too much of improvement in terms of the probable depth 10/4; using Santyl under the wound VAC. Comes in today with Sarah fairly dramatic improvement in surface area and marked improvement in the undermining area as well as condition of the wound bed. This is gratifying to see in view of this were going to continue the Santyl under the [facility] for another week. 10/11; patient presents for follow-up. She reports continued wound healing with Santyl and wound VAC. She has no issues or complaints today. She denies signs of infection. 10/17; although the wound appears to have contracted the measurements are not that much different and more problematically not much improvement in the depth and condition of the wound bed. She still has Sarah shallow undermining area perhaps it 0.3 that its been somewhat improved. She is using using Santyl under the wound VAC in preparation for Sarah trial of TheraSkin 10/27; wound looked quite good today. We went ahead with TheraSkin #1 today. The wound VAC can be discontinued 11/10; the TheraSkin was very adherent today. Very difficult to remove. Nevertheless the underlying tissue looked very healthy. Minimal undermining. TheraSkin #2 applied today 11/27/2020 upon evaluation today patient actually appears to be doing excellent in regard to  her wound. We are using TheraSkin currently which seems to be doing great. With this is actually application #3. Overall the wound is significantly smaller and I think we are headed in the right direction. 12/8; TheraSkin #4. Really dramatic  improvement I had not seen this in about Sarah month. There is no depth 12/21; wound is too small for another Scientist, forensic) Signed: 12/26/2020 3:43:54 PM By: [name] MD Entered By: [name] on 12/26/2020 14:13:47 -------------------------------------------------------------------------------- Chemical Cauterization Details Patient Name: Date of Service: [name], [name] 12/26/2020 1:30 PM Medical Record Number: [id number] Patient Account Number: [id number] Date of Birth/Sex: Treating RN: [birth year] (50 y.o. [name] Primary Care Provider: [name] Other Clinician: Referring Provider: Treating Provider/Extender: [name] in Treatment: 37 Procedure Performed for: Wound #1 Left Chest Performed By: Physician Ricard Dillon., MD Post Procedure Diagnosis Same as Pre-procedure Electronic Signature(s) Signed: 12/26/2020 3:43:54 PM By: [name] MD Entered By: [name] on 12/26/2020 14:16:37 -------------------------------------------------------------------------------- Physical Exam Details Patient Name: Date of Service: [name], [name] 12/26/2020 1:30 PM Medical Record Number: [id number] Patient Account Number: [id number] Date of Birth/Sex: Treating RN: Aug 31, 1970 (50 y.o. [name] Primary Care Provider: [name] Other Clinician: Referring Provider: Treating Provider/Extender: [name] in Treatment: 37 Constitutional Sitting or standing Blood Pressure is within target range for patient.. Pulse regular and within target range for patient.Marland Kitchen Respirations regular, non-labored and within target range..  Temperature is normal and within the target range for the patient.Marland Kitchen Appears in no distress. Notes Wound exam; the wound is now pinpoint. Slightly raised and hyper granulated. It was even hard to tell that this was still open I use silver nitrate on the remaining area after determining there was indeed an open area [had some drainage]. No evidence of surrounding infection Electronic Signature(s) Signed: 12/26/2020 3:43:54 PM By: [name] MD Entered By: [name] on 12/26/2020 14:14:54 -------------------------------------------------------------------------------- Physician Orders Details Patient Name: Date of Service: [name], [name] 12/26/2020 1:30 PM Medical Record Number: [id number] Patient Account Number: [id number] Date of Birth/Sex: Treating RN: Jun 17, 1970 (50 y.o. 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 - with Dr. Dellia Nims Return Sarah Bathing/ Shower/ Hygiene May shower with protection but do not get wound dressing(s) wet. Wound Treatment Wound #1 - Chest Wound Laterality: Left Cleanser: Wound Cleanser Every Other Day/15 Days Discharge Instructions: Cleanse the wound with wound cleanser prior to applying Sarah clean dressing using gauze sponges, not tissue or cotton balls. Prim Dressing: PolyMem Non-Adhesive Dressing, 4x4 in Every Other Day/15 Days ary Discharge Instructions: Apply to wound bed as  instructed Secured With: Tegaderm Transparent Film Dressing 4x4.75 (in/in) Every Other Day/15 Days Discharge Instructions: Secure dressing with Tegaderm as directed. Electronic Signature(s) Signed: 12/26/2020 3:43:54 PM By: [name] MD Signed: 12/26/2020 5:08:09 PM By: Deon Pilling RN, BSN Entered By: Deon Pilling on 12/26/2020 14:07:41 -------------------------------------------------------------------------------- Problem List Details Patient Name: Date of Service: [name] 12/26/2020 1:30 PM Medical Record Number: [id number] Patient Account Number: [id number] Date of Birth/Sex: Treating RN: 02/21/1970 (50 y.o. 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 12/26/2020 1:30 PM Medical Record Number: [id number] Patient Account Number: [id number] Date of Birth/Sex: Treating RN: [birth year] (50 y.o. [name] Primary Care Provider: [name] Other Clinician: Referring Provider: Treating Provider/Extender: [name] in Treatment:  37 Subjective History of Present Illness (HPI) After canADMISSION 04/09/2020 [name] 50 year old woman who is history of breast cancer in the left breast goes back to 2018. He was found to have Sarah mass in her left breast. [name] and then subsequent chemo that she did not tolerate very well. She had seed implants placed but then underwent external beam Sarah radiation in the spring 2019. 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 Her adjunctive radiation was from 03/11/2017 through 04/21/2017. Unfortunately she had Sarah left breast biopsy on 11/24/2019 and it was again positive for an invasive ductal carcinoma grade 3 he is to resume at receptor 80% positive. HER-2 negative progesterone negative. She opted for bilateral mastectomies which were done on 01/09/2020 on the right with no evidence of malignancy on the left stage IIb invasive ductal carcinoma. She again received chemotherapy but she did not tolerate it because of wound dehiscence and low platelet counts according to the patient. Unfortunately her mastectomy wound dehisced on the left breast. She was taken back to the OR by [name] of general surgery on 33/24/22 and she underwent Sarah debridement and subsequent attempt at closure. She had been previously treated with Augmentin for suspected underlying infection. Once again the wound has dehisced in the mid aspect. It is roughly 1 cm deep there is tunnels both medially and laterally she. She still has sutures in place. Still describes constant pain drainage. She also has Sarah drain still in place that drains about 10 cc/day. Currently she is changing ABD pad several times Sarah day 4/12; patient went back to see [name] who remove most of the stitches and debrided her wound. She has been using silver alginate culture I did last time which  was Sarah PCR culture showed Peptostreptococcus, Enterococcus faecalis and Staphylococcus epidermidis which is of questionable significance. [name] that the sutures have been removed. There are undermining areas superior medial superior lateral and Sarah deep tunneling area in the inferior part of the wound. There is no obvious purulence. I started her on Augmentin on 04/12/2020 and I will likely extend this for another week AMAZINGLY; she is already been approved for HBO and will start today. [name] Echocardiogram which I reviewed from 2019 showed Sarah normal ejection fraction.. I went over side effects of HBO with the patient and answered any of her questions. Her husband was present 5/3; [name]. She went to see [name] about 2-1/2  weeks ago according to the patient he can remove some of the surrounding debris. She will see him again later this week or early next week. She has been using silver alginate. Sarah lot of this is problematic since she has Medicaid which traditionally does not cover wound care supplies although it traditionally does not cover HBO either Her wound actually looks quite Sarah bit better than the last time I saw this better looking surface healthy looking tissue. She has tunnels at 810 2 in 6 the deepest of these is at 2 at 2.2 cm there is no exposed bone. 5/10; the patient was coming in for nurse visit today but requested that I see the wound because of surface discoloration. She states the Iodoflex makes the surface of this turned "white". She is not really complaining of undue tenderness. She is changing this every second day. The goal of this had been to get the surface of the wound to Sarah vibrant red color and then potentially look at changing to Sarah silver collagen-based dressing, Puraply, and then perhaps an alternative skin substituteo Derma vest. The patient tells me she has an appoint with her oncologist  [name] on the 19th. She apparently think she is going to get another PET scan. Based on this I would like his comment about whether there is possibility of any malignancy in the wound itself or just underneath it. Up to now we have not really thought that was true 5/17; nice improvement in the chest wound. Less depth. We have been using Iodoflex to clean off the wound surface this seems to have done nicely. Patient went to see [name] 2 days ago who has referred her to see Dr. Claudia Desanctis. I certainly agree with the thought the surface of the wound looks Sarah lot better now and I think consideration of plastic surgery is certainly something the patient should discuss. I have talked to the patient about this today and I certainly support Sarah conversation with Dr. Claudia Desanctis. I also wondered about is there any residual cancer in this. [name] apparently did not seem to think so per the patient although I do not see that in the text conversations we have had. I also wonder about Sarah skin substitute if her version of Medicare [[employer]] would cover this. [name] is apparently considering starting her on [place]. Patient is already very scared about the side effects but she will discuss this with [name] on 5/19 5/31; patient's wound about the same in terms of size she still has the tunneling area to the left. Surface of the wound looks Sarah lot better there has been some filling in the granulation I have been included on serial text between Dr. Callie Fielding surgery] Dr. Ruffin Frederick oncology] and Dr. Para Skeans surgery]. I think there is not Sarah lot of thought that the patient has recurrence of her cancer her MRI was indeterminant but [name] did not feel that there was any reason to think she has Sarah major recurrence and he was not in favor of doing Sarah dedicated breast MRI. Dr. Claudia Desanctis wants to go ahead with Sarah flap closure I did it. I have not reviewed his note but per the patient [name]  latissimus dorsi myocutaneous flap or I think Sarah skin flap from her abdomen. I have talked to the patient I think it is time to attempt this. If I can continue hyperbarics surrounding the flap then we will attempt to do this 6/14; patient  using Prisma as the primary dressing here. She has completed hyperbarics and did very well. Her wound is responded nicely. The depth of this and the condition of the granulation tissue looks Sarah lot better. She has undermining areas at 2 and 10:00 both of these seem to be better as well. She is scheduled for flap closure on 7/11 by Dr. Claudia Desanctis 6/29; the patient continues to use Prisma wet-to-dry. Her graft is scheduled for 7/11 by Dr. Claudia Desanctis. We are looking at getting her additional hyperbaric treatment which I think might help with the likelihood of graft survival in this difficult irradiated area 7/21; patient returns to clinic to reinitiate HBO after her surgical procedure by Dr. Claudia Desanctis. On 07/15/2020 she had debridement of left chest wound and Sarah rotational flap closure. She also had excision of right excess axillary tissue. Pathology did not show any malignancy and follow-up Dr. Claudia Desanctis saw the patient yesterday. He noted Sarah 2 cm area of wound dehiscence. Recommended she return for hyperbaric oxygen and she returns today for her first treatment. Follow-up with him in 2 weeks. We were not actually going to see the patient today her appointment to be evaluated was Monday however she was concerned about Sarah "fishy odor". She is also noted some drainage. Not specifically dressing in the wound area but covering with Adaptic 7/25; I am seeing this wound and follow-up. So far her culture is polymicrobial not growing any specific anything specific nevertheless with the Augmentin and the silver alginate her odor is Sarah lot better. Not as much periwound tenderness. Dimensions measuring 2.7 x 3.3 x 1.6. She has undermining from 10-1 o'clock of about 1 and half centimeters. Necrotic fat on the  surface of this... 08/13/2020 upon evaluation today patient appears to be doing decently well in regard to her wound all things considered. Obviously this is much more open than what was hoped for based on the fact that she had Sarah flap performed which was supposed to completely close this wound. Nonetheless currently she tells me that she is not really having any significant pain fortunately the drainage is improved compared to last time there is still some evidence of odor as far as the drainage is concerned but is definitely not as purulent as what was previously noted. With that being said I do believe that there are quite Sarah few sutures which are quite embedded. Nonetheless I believe this needs to be removed. In fact if they are not I am afraid this is again actually grow into the skin and that is can to be even bigger issue. Some of them even so at this point appeared that there could be difficult to even get out. 8/17; this is the first time I am seeing this in about 3 weeks. The patient had stop by my office yesterday to discuss after completing hyperbaric therapy. Based on what she said I was expecting the worst today however apparently with dressing changes and perhaps debridement last week the surface of the wound actually looks better than what I was expecting. There is considerable depth although the last time I saw this the base of the wound was necrotic there is still some necrotic tissue on the surface of the wound bed however in general the rest the tissue looks healthy even with some bleeding. She has been using cleaning the wound with Dakin's and then applying Prisma and then backing Dakin's wet-to-dry. This seems to work to clean the wound surface up. They also ordered Sarah wound VAC  and I discussed this with her in depth today She is not systemically unwell. She is not running Sarah fever there is no odor. She talks about pain over the lower part of the flap to touch. This does not appear to  be related to movement of her shoulder. 8/23; the patient has been using the wound VAC for 1 week. Already Sarah remarkable improvement and she appears to be tolerating this well we had Santyl under the wound VAC however the surface looks so clean today I am doubtful that is going to be necessary She is also being treated with HBO. This is helped her dramatically. Apparently her current certification will be up on 8/31. She is only had 19 dives and we will certainly need to recertify her up for this. Since I saw her last week I put in Sarah secure text message to Dr. Claudia Desanctis through epic to let him know that we were going ahead with Sarah wound VAC. He did not have any objections to this. He is offering her more surgery however in the eventuality that this does not close 8/30; the patient missed 2 treatments of HBO last week because of nausea. I think she is now on Compazine which seems to have helped. Otherwise she is tolerating HBO well. We are collagen under wound VAC. Direct depth of wound measured at 1.2 cm which I think is probably unchanged however the undermining area is down to 0.5 which I think is an improvement 9/6; patient continues with chemotherapy although she says she is troll of her nausea. We are using collagen with Sarah wound VAC on the remaining wound on her chest. No major improvements in the undermining area from 12-5 currently measuring 0.5 cm. This is no better than last week 9/13; patient continuing with chemotherapy. She completes HBO today. We are using silver collagen under wound VAC. No major improvements however the undermining area seems somewhat better to me. 9/20; we are using silver collagen under the wound VAC and although about 60% of the wound looks healthy. At the base of the wound there is still Sarah very adherent slough. She has been approved for TheraSkin which I would like to use if we can get the wound bed in Sarah cleaner situation. 9/27; we are using Santyl under the wound VAC.  Dimensions are slightly better including depth of 0.7 cm. Slight improvement in length and width the undermining area currently at 12-2 done in itself and improvement measures 0.6 which is not too much of improvement in terms of the probable depth 10/4; using Santyl under the wound VAC. Comes in today with Sarah fairly dramatic improvement in surface area and marked improvement in the undermining area as well as condition of the wound bed. This is gratifying to see in view of this were going to continue the Santyl under the [facility] for another week. 10/11; patient presents for follow-up. She reports continued wound healing with Santyl and wound VAC. She has no issues or complaints today. She denies signs of infection. 10/17; although the wound appears to have contracted the measurements are not that much different and more problematically not much improvement in the depth and condition of the wound bed. She still has Sarah shallow undermining area perhaps it 0.3 that its been somewhat improved. She is using using Santyl under the wound VAC in preparation for Sarah trial of TheraSkin 10/27; wound looked quite good today. We went ahead with TheraSkin #1 today. The wound VAC can be discontinued 11/10;  the TheraSkin was very adherent today. Very difficult to remove. Nevertheless the underlying tissue looked very healthy. Minimal undermining. TheraSkin #2 applied today 11/27/2020 upon evaluation today patient actually appears to be doing excellent in regard to her wound. We are using TheraSkin currently which seems to be doing great. With this is actually application #3. Overall the wound is significantly smaller and I think we are headed in the right direction. 12/8; TheraSkin #4. Really dramatic improvement I had not seen this in about Sarah month. There is no depth 12/21; wound is too small for another TheraSkin Objective Constitutional Sitting or standing Blood Pressure is within target range for patient.. Pulse  regular and within target range for patient.Marland Kitchen Respirations regular, non-labored and within target range.. Temperature is normal and within the target range for the patient.Marland Kitchen Appears in no distress. Vitals Time Taken: 1:55 PM, Height: 67 in, Weight: 257 lbs, BMI: 40.2, Temperature: 98.3 F, Pulse: 79 bpm, Respiratory Rate: 20 breaths/min, Blood Pressure: 123/84 mmHg. General Notes: Wound exam; the wound is now pinpoint. Slightly raised and hyper granulated. It was even hard to tell that this was still open I use silver nitrate on the remaining area after determining there was indeed an open area [had some drainage]. No evidence of surrounding infection Integumentary (Hair, Skin) Wound #1 status is Open. Original cause of wound was Surgical Injury. The date acquired was: 01/16/2020. The wound has been in treatment 37 weeks. The wound is located on the Left Chest. The wound measures 0.2cm length x 0.2cm width x 0.1cm depth; 0.031cm^2 area and 0.003cm^3 volume. There is Fat Layer (Subcutaneous Tissue) exposed. There is no tunneling or undermining noted. There is Sarah none present amount of drainage noted. The wound margin is distinct with the outline attached to the wound base. There is no granulation within the wound bed. There is no necrotic tissue within the wound bed. 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 - with Dr. Arcola Jansky Shower/ Hygiene: May shower with protection but do not get wound dressing(s) wet. WOUND #1: - Chest Wound Laterality: Left Cleanser: Wound Cleanser Every Other Day/15 Days Discharge Instructions: Cleanse the wound with wound cleanser prior  to applying Sarah clean dressing using gauze sponges, not tissue or cotton balls. Prim Dressing: PolyMem Non-Adhesive Dressing, 4x4 in Every Other Day/15 Days ary Discharge Instructions: Apply to wound bed as instructed Secured With: Tegaderm Transparent Film Dressing 4x4.75 (in/in) Every Other Day/15 Days Discharge Instructions: Secure dressing with Tegaderm as directed. 1. I used silver nitrate to knock down the hyper granulated area and also leaky friable tissue. 2. Plain polymen change every 2 Electronic Signature(s) Signed: 12/26/2020 3:43:54 PM By: [name] MD Entered By: [name] on 12/26/2020 14:15:41 -------------------------------------------------------------------------------- SuperBill Details Patient Name: Date of Service: [name] 12/26/2020 Medical Record Number: [id number] Patient Account Number: [id number] Date of Birth/Sex: Treating RN: 16-Jan-1970 (50 y.o. 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

## 2021-01-02 ENCOUNTER — Other Ambulatory Visit (HOSPITAL_COMMUNITY): Payer: Self-pay

## 2021-01-05 ENCOUNTER — Other Ambulatory Visit (HOSPITAL_COMMUNITY): Payer: Self-pay

## 2021-01-06 ENCOUNTER — Other Ambulatory Visit (HOSPITAL_COMMUNITY): Payer: Self-pay

## 2021-01-07 ENCOUNTER — Other Ambulatory Visit (HOSPITAL_COMMUNITY): Payer: Self-pay

## 2021-01-08 ENCOUNTER — Other Ambulatory Visit (HOSPITAL_COMMUNITY): Payer: Self-pay

## 2021-01-09 ENCOUNTER — Ambulatory Visit: Payer: Medicaid Other | Admitting: Physical Therapy

## 2021-01-09 ENCOUNTER — Encounter (HOSPITAL_BASED_OUTPATIENT_CLINIC_OR_DEPARTMENT_OTHER): Payer: Medicaid Other | Attending: Internal Medicine | Admitting: Internal Medicine

## 2021-01-09 ENCOUNTER — Other Ambulatory Visit: Payer: Self-pay

## 2021-01-09 DIAGNOSIS — T8131XD Disruption of external operation (surgical) wound, not elsewhere classified, subsequent encounter: Secondary | ICD-10-CM | POA: Diagnosis present

## 2021-01-09 DIAGNOSIS — Z171 Estrogen receptor negative status [ER-]: Secondary | ICD-10-CM | POA: Diagnosis not present

## 2021-01-09 DIAGNOSIS — X58XXXD Exposure to other specified factors, subsequent encounter: Secondary | ICD-10-CM | POA: Insufficient documentation

## 2021-01-09 DIAGNOSIS — Z9013 Acquired absence of bilateral breasts and nipples: Secondary | ICD-10-CM | POA: Insufficient documentation

## 2021-01-09 DIAGNOSIS — L598 Other specified disorders of the skin and subcutaneous tissue related to radiation: Secondary | ICD-10-CM | POA: Diagnosis not present

## 2021-01-09 DIAGNOSIS — D0512 Intraductal carcinoma in situ of left breast: Secondary | ICD-10-CM | POA: Diagnosis not present

## 2021-01-09 DIAGNOSIS — Z9010 Acquired absence of unspecified breast and nipple: Secondary | ICD-10-CM | POA: Insufficient documentation

## 2021-01-09 NOTE — Progress Notes (Signed)
[name], [name] ([id number]) Visit Report for 01/09/2021 HPI Details Patient Name: Date of Service: [name] [name] 01/09/2021 1:15 PM Medical Record Number: [id number] Patient Account Number: [id number] Date of Birth/Sex: Treating RN: May 14, 1970 (51 y.o. [name] Primary Care Provider: [name] Other Clinician: Referring Provider: Treating Provider/Extender: [name] in Treatment: 39 History of Present Illness HPI Description: After canADMISSION 04/09/2020 [name] 51 year old woman who is history of breast cancer in the left breast goes back to 2018. He was found to have Sarah mass in her left breast. [name] and then subsequent chemo that she did not tolerate very well. She had seed implants placed but then underwent external beam Sarah radiation in the spring 2019. 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 Her adjunctive radiation was from 03/11/2017 through 04/21/2017. Unfortunately she had Sarah left breast biopsy on 11/24/2019 and it was again positive for an invasive ductal carcinoma grade 3 he is to resume at receptor 80% positive. HER-2 negative progesterone negative. She opted for bilateral mastectomies which were done on 01/09/2020 on the right with no evidence of malignancy on the left stage IIb invasive ductal carcinoma. She again received chemotherapy but she did not tolerate it because of wound dehiscence and low platelet counts according to the patient. Unfortunately her mastectomy wound dehisced on the left breast. She was taken back to the OR by [name] of general surgery on 33/24/22 and she underwent Sarah debridement and subsequent attempt at closure. She had been previously treated with Augmentin for suspected underlying infection. Once again the wound has dehisced in the  mid aspect. It is roughly 1 cm deep there is tunnels both medially and laterally she. She still has sutures in place. Still describes constant pain drainage. She also has Sarah drain still in place that drains about 10 cc/day. Currently she is changing ABD pad several times Sarah day 4/12; patient went back to see [name] who remove most of the stitches and debrided her wound. She has been using silver alginate culture I did last time which was Sarah PCR culture showed Peptostreptococcus, Enterococcus faecalis and Staphylococcus epidermidis which is of questionable significance. [name] that the sutures have been removed. There are undermining areas superior medial superior lateral and Sarah deep tunneling area in the inferior part of the wound. There is no obvious purulence. I started her on Augmentin on 04/12/2020 and I will likely extend this for another week AMAZINGLY; she is already been approved for HBO and will start today. [name] Echocardiogram which I reviewed from 2019 showed Sarah normal ejection fraction.. I went over side effects of HBO with the patient and answered any of her questions. Her husband was present 5/3; [name]. She went to see [name] about 2-1/2 weeks ago according to the patient he can remove some of the surrounding debris. She will see him again later this week or early next week. She has been using silver alginate. Sarah lot of this is problematic since she has Medicaid which traditionally does not cover wound care supplies although it traditionally does not cover HBO either Her wound actually looks quite Sarah bit better than the last time I saw this better looking surface healthy looking  tissue. She has tunnels at 810 2 in 6 the deepest of these is at 2 at 2.2 cm there is no exposed bone. 5/10; the patient was coming in for nurse visit today but requested that I see the wound because of surface discoloration.  She states the Iodoflex makes the surface of this turned "white". She is not really complaining of undue tenderness. She is changing this every second day. The goal of this had been to get the surface of the wound to Sarah vibrant red color and then potentially look at changing to Sarah silver collagen-based dressing, Puraply, and then perhaps an alternative skin substituteo Derma vest. The patient tells me she has an appoint with her oncologist [name] on the 19th. She apparently think she is going to get another PET scan. Based on this I would like his comment about whether there is possibility of any malignancy in the wound itself or just underneath it. Up to now we have not really thought that was true 5/17; nice improvement in the chest wound. Less depth. We have been using Iodoflex to clean off the wound surface this seems to have done nicely. Patient went to see [name] 2 days ago who has referred her to see Dr. Claudia Desanctis. I certainly agree with the thought the surface of the wound looks Sarah lot better now and I think consideration of plastic surgery is certainly something the patient should discuss. I have talked to the patient about this today and I certainly support Sarah conversation with Dr. Claudia Desanctis. I also wondered about is there any residual cancer in this. [name] apparently did not seem to think so per the patient although I do not see that in the text conversations we have had. I also wonder about Sarah skin substitute if her version of Medicare [[employer]] would cover this. [name] is apparently considering starting her on [place]. Patient is already very scared about the side effects but she will discuss this with [name] on 5/19 5/31; patient's wound about the same in terms of size she still has the tunneling area to the left. Surface of the wound looks Sarah lot better there has been some filling in the granulation I have been included on serial text between Dr. Callie Fielding  surgery] Dr. Ruffin Frederick oncology] and Dr. Para Skeans surgery]. I think there is not Sarah lot of thought that the patient has recurrence of her cancer her MRI was indeterminant but [name] did not feel that there was any reason to think she has Sarah major recurrence and he was not in favor of doing Sarah dedicated breast MRI. Dr. Claudia Desanctis wants to go ahead with Sarah flap closure I did it. I have not reviewed his note but per the patient [name] latissimus dorsi myocutaneous flap or I think Sarah skin flap from her abdomen. I have talked to the patient I think it is time to attempt this. If I can continue hyperbarics surrounding the flap then we will attempt to do this 6/14; patient using Prisma as the primary dressing here. She has completed hyperbarics and did very well. Her wound is responded nicely. The depth of this and the condition of the granulation tissue looks Sarah lot better. She has undermining areas at 2 and 10:00 both of these seem to be better as well. She is scheduled for flap closure on 7/11 by Dr. Claudia Desanctis 6/29; the patient continues to use Prisma wet-to-dry. Her graft is scheduled for 7/11 by  Dr. Claudia Desanctis. We are looking at getting her additional hyperbaric treatment which I think might help with the likelihood of graft survival in this difficult irradiated area 7/21; patient returns to clinic to reinitiate HBO after her surgical procedure by Dr. Claudia Desanctis. On 07/15/2020 she had debridement of left chest wound and Sarah rotational flap closure. She also had excision of right excess axillary tissue. Pathology did not show any malignancy and follow-up Dr. Claudia Desanctis saw the patient yesterday. He noted Sarah 2 cm area of wound dehiscence. Recommended she return for hyperbaric oxygen and she returns today for her first treatment. Follow-up with him in 2 weeks. We were not actually going to see the patient today her appointment to be evaluated was Monday however she was concerned about Sarah "fishy odor". She is also noted some  drainage. Not specifically dressing in the wound area but covering with Adaptic 7/25; I am seeing this wound and follow-up. So far her culture is polymicrobial not growing any specific anything specific nevertheless with the Augmentin and the silver alginate her odor is Sarah lot better. Not as much periwound tenderness. Dimensions measuring 2.7 x 3.3 x 1.6. She has undermining from 10-1 o'clock of about 1 and half centimeters. Necrotic fat on the surface of this... 08/13/2020 upon evaluation today patient appears to be doing decently well in regard to her wound all things considered. Obviously this is much more open than what was hoped for based on the fact that she had Sarah flap performed which was supposed to completely close this wound. Nonetheless currently she tells me that she is not really having any significant pain fortunately the drainage is improved compared to last time there is still some evidence of odor as far as the drainage is concerned but is definitely not as purulent as what was previously noted. With that being said I do believe that there are quite Sarah few sutures which are quite embedded. Nonetheless I believe this needs to be removed. In fact if they are not I am afraid this is again actually grow into the skin and that is can to be even bigger issue. Some of them even so at this point appeared that there could be difficult to even get out. 8/17; this is the first time I am seeing this in about 3 weeks. The patient had stop by my office yesterday to discuss after completing hyperbaric therapy. Based on what she said I was expecting the worst today however apparently with dressing changes and perhaps debridement last week the surface of the wound actually looks better than what I was expecting. There is considerable depth although the last time I saw this the base of the wound was necrotic there is still some necrotic tissue on the surface of the wound bed however in general the rest the  tissue looks healthy even with some bleeding. She has been using cleaning the wound with Dakin's and then applying Prisma and then backing Dakin's wet-to-dry. This seems to work to clean the wound surface up. They also ordered Sarah wound VAC and I discussed this with her in depth today She is not systemically unwell. She is not running Sarah fever there is no odor. She talks about pain over the lower part of the flap to touch. This does not appear to be related to movement of her shoulder. 8/23; the patient has been using the wound VAC for 1 week. Already Sarah remarkable improvement and she appears to be tolerating this well we had Santyl under  the wound VAC however the surface looks so clean today I am doubtful that is going to be necessary She is also being treated with HBO. This is helped her dramatically. Apparently her current certification will be up on 8/31. She is only had 19 dives and we will certainly need to recertify her up for this. Since I saw her last week I put in Sarah secure text message to Dr. Claudia Desanctis through epic to let him know that we were going ahead with Sarah wound VAC. He did not have any objections to this. He is offering her more surgery however in the eventuality that this does not close 8/30; the patient missed 2 treatments of HBO last week because of nausea. I think she is now on Compazine which seems to have helped. Otherwise she is tolerating HBO well. We are collagen under wound VAC. Direct depth of wound measured at 1.2 cm which I think is probably unchanged however the undermining area is down to 0.5 which I think is an improvement 9/6; patient continues with chemotherapy although she says she is troll of her nausea. We are using collagen with Sarah wound VAC on the remaining wound on her chest. No major improvements in the undermining area from 12-5 currently measuring 0.5 cm. This is no better than last week 9/13; patient continuing with chemotherapy. She completes HBO today. We are  using silver collagen under wound VAC. No major improvements however the undermining area seems somewhat better to me. 9/20; we are using silver collagen under the wound VAC and although about 60% of the wound looks healthy. At the base of the wound there is still Sarah very adherent slough. She has been approved for TheraSkin which I would like to use if we can get the wound bed in Sarah cleaner situation. 9/27; we are using Santyl under the wound VAC. Dimensions are slightly better including depth of 0.7 cm. Slight improvement in length and width the undermining area currently at 12-2 done in itself and improvement measures 0.6 which is not too much of improvement in terms of the probable depth 10/4; using Santyl under the wound VAC. Comes in today with Sarah fairly dramatic improvement in surface area and marked improvement in the undermining area as well as condition of the wound bed. This is gratifying to see in view of this were going to continue the Santyl under the [facility] for another week. 10/11; patient presents for follow-up. She reports continued wound healing with Santyl and wound VAC. She has no issues or complaints today. She denies signs of infection. 10/17; although the wound appears to have contracted the measurements are not that much different and more problematically not much improvement in the depth and condition of the wound bed. She still has Sarah shallow undermining area perhaps it 0.3 that its been somewhat improved. She is using using Santyl under the wound VAC in preparation for Sarah trial of TheraSkin 10/27; wound looked quite good today. We went ahead with TheraSkin #1 today. The wound VAC can be discontinued 11/10; the TheraSkin was very adherent today. Very difficult to remove. Nevertheless the underlying tissue looked very healthy. Minimal undermining. TheraSkin #2 applied today 11/27/2020 upon evaluation today patient actually appears to be doing excellent in regard to her wound. We are  using TheraSkin currently which seems to be doing great. With this is actually application #3. Overall the wound is significantly smaller and I think we are headed in the right direction. 12/8; TheraSkin #4. Really dramatic  improvement I had not seen this in about Sarah month. There is no depth 12/21; wound is too small for another TheraSkin 01/09/2021; the patient comes in with the same pinpoint spot and with what looks like surrounding slough. I think I gently debrided this last time however under intense illumination it looks as though the surface of this is all epithelialized although not particularly robust Electronic Signature(s) Signed: 01/09/2021 4:38:12 PM By: [name] MD Entered By: [name] on 01/09/2021 14:04:28 -------------------------------------------------------------------------------- Physical Exam Details Patient Name: Date of Service: [name], Sarah V [name] 01/09/2021 1:15 PM Medical Record Number: [id number] Patient Account Number: [id number] Date of Birth/Sex: Treating RN: 1970/12/24 (51 y.o. [name] Primary Care Provider: [name] Other Clinician: Referring Provider: Treating Provider/Extender: [name] in Treatment: 39 Constitutional Sitting or standing Blood Pressure is within target range for patient.. Pulse regular and within target range for patient.Marland Kitchen Respirations regular, non-labored and within target range.. Temperature is normal and within the target range for the patient.Marland Kitchen Appears in no distress. Notes Wound exam; as mentioned there is Sarah pinpoint area that did not look completely closed with what looks to be some small [Sarah few millimeters] area of slough however under intense illumination this is all covered by surface epithelialization. Absolutely no debridement Electronic Signature(s) Signed: 01/09/2021 4:38:12 PM By: [name] MD Entered By: [name] on 01/09/2021  14:05:15 -------------------------------------------------------------------------------- Physician Orders Details Patient Name: Date of Service: Sarah Crochet V [name] 01/09/2021 1:15 PM Medical Record Number: [id number] Patient Account Number: [id number] Date of Birth/Sex: Treating RN: [birth year] (51 y.o. 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 - with Dr. Dellia Nims- to check area in two weeks. Return Sarah keep area covered with calicum alginate, gauze, tegaderm- change every two days. Bathing/ Shower/ Hygiene May shower and wash wound with soap and water. Electronic Signature(s) Signed: 01/09/2021 4:38:12 PM By: [name] MD Signed: 01/09/2021 5:33:22 PM By: Deon Pilling RN, BSN Entered By: Deon Pilling on 01/09/2021 13:45:11 -------------------------------------------------------------------------------- Problem List Details Patient Name: Date of Service: Sarah [name] 01/09/2021 1:15 PM Medical Record Number: [id number] Patient Account Number: [id number] Date of Birth/Sex: Treating RN: [birth year] (51 y.o. 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 01/09/2021 1:15 PM Medical Record Number: [id number] Patient Account Number: [id number] Date of Birth/Sex: Treating RN: 1971-01-05 (50  y.o. [name] Primary Care Provider: [name] Other Clinician: Referring Provider: Treating Provider/Extender: [name] in Treatment: 39 Subjective History of Present Illness (HPI) After canADMISSION 04/09/2020 [name] 51 year old woman who is history of breast cancer in the left breast goes back to 2018. He was found to have Sarah mass in her left breast. [name] and then subsequent chemo that she did not tolerate very well. She had seed implants placed but then underwent external beam Sarah radiation in the spring 2019. 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 Her adjunctive radiation was from 03/11/2017 through 04/21/2017. Unfortunately she had Sarah left breast biopsy on 11/24/2019 and it was again positive for an invasive ductal carcinoma grade 3 he is to resume at receptor 80% positive. HER-2 negative progesterone negative. She opted for bilateral mastectomies which were done on 01/09/2020 on the right with no evidence of malignancy  on the left stage IIb invasive ductal carcinoma. She again received chemotherapy but she did not tolerate it because of wound dehiscence and low platelet counts according to the patient. Unfortunately her mastectomy wound dehisced on the left breast. She was taken back to the OR by [name] of general surgery on 33/24/22 and she underwent Sarah debridement and subsequent attempt at closure. She had been previously treated with Augmentin for suspected underlying infection. Once again the wound has dehisced in the mid aspect. It is roughly 1 cm deep there is tunnels both medially and laterally she. She still has sutures in place. Still describes constant pain drainage. She also has Sarah drain still in place that drains about 10 cc/day. Currently she is changing ABD pad several times Sarah day 4/12; patient went back to see [name] who remove most of the stitches and debrided her wound. She has been using silver alginate culture I did last time which was Sarah PCR culture showed Peptostreptococcus, Enterococcus faecalis and Staphylococcus epidermidis which is of questionable significance. [name] that the sutures have been removed. There are undermining areas superior medial superior lateral and Sarah deep tunneling area in the inferior part of the wound. There is no obvious purulence. I started her on Augmentin on 04/12/2020 and I will likely extend this for another week AMAZINGLY; she is already been approved for HBO and will start today. [name] Echocardiogram which I reviewed from 2019 showed Sarah normal ejection fraction.. I went over side effects of HBO with the patient and answered any of her questions. Her husband was present 5/3; [name]. She went to see [name] about 2-1/2 weeks ago according to the patient he can remove some of the surrounding debris. She will see him again later this week or early next week. She has been  using silver alginate. Sarah lot of this is problematic since she has Medicaid which traditionally does not cover wound care supplies although it traditionally does not cover HBO either Her wound actually looks quite Sarah bit better than the last time I saw this better looking surface healthy looking tissue. She has tunnels at 810 2 in 6 the deepest of these is at 2 at 2.2 cm there is no exposed bone. 5/10; the patient was coming in for nurse visit today but requested that I see the  wound because of surface discoloration. She states the Iodoflex makes the surface of this turned "white". She is not really complaining of undue tenderness. She is changing this every second day. The goal of this had been to get the surface of the wound to Sarah vibrant red color and then potentially look at changing to Sarah silver collagen-based dressing, Puraply, and then perhaps an alternative skin substituteo Derma vest. The patient tells me she has an appoint with her oncologist [name] on the 19th. She apparently think she is going to get another PET scan. Based on this I would like his comment about whether there is possibility of any malignancy in the wound itself or just underneath it. Up to now we have not really thought that was true 5/17; nice improvement in the chest wound. Less depth. We have been using Iodoflex to clean off the wound surface this seems to have done nicely. Patient went to see [name] 2 days ago who has referred her to see Dr. Claudia Desanctis. I certainly agree with the thought the surface of the wound looks Sarah lot better now and I think consideration of plastic surgery is certainly something the patient should discuss. I have talked to the patient about this today and I certainly support Sarah conversation with Dr. Claudia Desanctis. I also wondered about is there any residual cancer in this. [name] apparently did not seem to think so per the patient although I do not see that in the text conversations we have had.  I also wonder about Sarah skin substitute if her version of Medicare [BCCP] would cover this. [name] is apparently considering starting her on [place]. Patient is already very scared about the side effects but she will discuss this with [name] on 5/19 5/31; patient's wound about the same in terms of size she still has the tunneling area to the left. Surface of the wound looks Sarah lot better there has been some filling in the granulation I have been included on serial text between Dr. Callie Fielding surgery] Dr. Ruffin Frederick oncology] and Dr. Para Skeans surgery]. I think there is not Sarah lot of thought that the patient has recurrence of her cancer her MRI was indeterminant but [name] did not feel that there was any reason to think she has Sarah major recurrence and he was not in favor of doing Sarah dedicated breast MRI. Dr. Claudia Desanctis wants to go ahead with Sarah flap closure I did it. I have not reviewed his note but per the patient [name] latissimus dorsi myocutaneous flap or I think Sarah skin flap from her abdomen. I have talked to the patient I think it is time to attempt this. If I can continue hyperbarics surrounding the flap then we will attempt to do this 6/14; patient using Prisma as the primary dressing here. She has completed hyperbarics and did very well. Her wound is responded nicely. The depth of this and the condition of the granulation tissue looks Sarah lot better. She has undermining areas at 2 and 10:00 both of these seem to be better as well. She is scheduled for flap closure on 7/11 by Dr. Claudia Desanctis 6/29; the patient continues to use Prisma wet-to-dry. Her graft is scheduled for 7/11 by Dr. Claudia Desanctis. We are looking at getting her additional hyperbaric treatment which I think might help with the likelihood of graft survival in this difficult irradiated area 7/21; patient returns to clinic to reinitiate HBO after her surgical procedure by Dr.  Pace. On 07/15/2020 she had debridement of left  chest wound and Sarah rotational flap closure. She also had excision of right excess axillary tissue. Pathology did not show any malignancy and follow-up Dr. Claudia Desanctis saw the patient yesterday. He noted Sarah 2 cm area of wound dehiscence. Recommended she return for hyperbaric oxygen and she returns today for her first treatment. Follow-up with him in 2 weeks. We were not actually going to see the patient today her appointment to be evaluated was Monday however she was concerned about Sarah "fishy odor". She is also noted some drainage. Not specifically dressing in the wound area but covering with Adaptic 7/25; I am seeing this wound and follow-up. So far her culture is polymicrobial not growing any specific anything specific nevertheless with the Augmentin and the silver alginate her odor is Sarah lot better. Not as much periwound tenderness. Dimensions measuring 2.7 x 3.3 x 1.6. She has undermining from 10-1 o'clock of about 1 and half centimeters. Necrotic fat on the surface of this... 08/13/2020 upon evaluation today patient appears to be doing decently well in regard to her wound all things considered. Obviously this is much more open than what was hoped for based on the fact that she had Sarah flap performed which was supposed to completely close this wound. Nonetheless currently she tells me that she is not really having any significant pain fortunately the drainage is improved compared to last time there is still some evidence of odor as far as the drainage is concerned but is definitely not as purulent as what was previously noted. With that being said I do believe that there are quite Sarah few sutures which are quite embedded. Nonetheless I believe this needs to be removed. In fact if they are not I am afraid this is again actually grow into the skin and that is can to be even bigger issue. Some of them even so at this point appeared that there could be difficult to even get out. 8/17; this is the first time I am seeing  this in about 3 weeks. The patient had stop by my office yesterday to discuss after completing hyperbaric therapy. Based on what she said I was expecting the worst today however apparently with dressing changes and perhaps debridement last week the surface of the wound actually looks better than what I was expecting. There is considerable depth although the last time I saw this the base of the wound was necrotic there is still some necrotic tissue on the surface of the wound bed however in general the rest the tissue looks healthy even with some bleeding. She has been using cleaning the wound with Dakin's and then applying Prisma and then backing Dakin's wet-to-dry. This seems to work to clean the wound surface up. They also ordered Sarah wound VAC and I discussed this with her in depth today She is not systemically unwell. She is not running Sarah fever there is no odor. She talks about pain over the lower part of the flap to touch. This does not appear to be related to movement of her shoulder. 8/23; the patient has been using the wound VAC for 1 week. Already Sarah remarkable improvement and she appears to be tolerating this well we had Santyl under the wound VAC however the surface looks so clean today I am doubtful that is going to be necessary She is also being treated with HBO. This is helped her dramatically. Apparently her current certification will be up on 8/31. She  is only had 19 dives and we will certainly need to recertify her up for this. Since I saw her last week I put in Sarah secure text message to Dr. Claudia Desanctis through epic to let him know that we were going ahead with Sarah wound VAC. He did not have any objections to this. He is offering her more surgery however in the eventuality that this does not close 8/30; the patient missed 2 treatments of HBO last week because of nausea. I think she is now on Compazine which seems to have helped. Otherwise she is tolerating HBO well. We are collagen under wound  VAC. Direct depth of wound measured at 1.2 cm which I think is probably unchanged however the undermining area is down to 0.5 which I think is an improvement 9/6; patient continues with chemotherapy although she says she is troll of her nausea. We are using collagen with Sarah wound VAC on the remaining wound on her chest. No major improvements in the undermining area from 12-5 currently measuring 0.5 cm. This is no better than last week 9/13; patient continuing with chemotherapy. She completes HBO today. We are using silver collagen under wound VAC. No major improvements however the undermining area seems somewhat better to me. 9/20; we are using silver collagen under the wound VAC and although about 60% of the wound looks healthy. At the base of the wound there is still Sarah very adherent slough. She has been approved for TheraSkin which I would like to use if we can get the wound bed in Sarah cleaner situation. 9/27; we are using Santyl under the wound VAC. Dimensions are slightly better including depth of 0.7 cm. Slight improvement in length and width the undermining area currently at 12-2 done in itself and improvement measures 0.6 which is not too much of improvement in terms of the probable depth 10/4; using Santyl under the wound VAC. Comes in today with Sarah fairly dramatic improvement in surface area and marked improvement in the undermining area as well as condition of the wound bed. This is gratifying to see in view of this were going to continue the Santyl under the [facility] for another week. 10/11; patient presents for follow-up. She reports continued wound healing with Santyl and wound VAC. She has no issues or complaints today. She denies signs of infection. 10/17; although the wound appears to have contracted the measurements are not that much different and more problematically not much improvement in the depth and condition of the wound bed. She still has Sarah shallow undermining area perhaps it 0.3 that  its been somewhat improved. She is using using Santyl under the wound VAC in preparation for Sarah trial of TheraSkin 10/27; wound looked quite good today. We went ahead with TheraSkin #1 today. The wound VAC can be discontinued 11/10; the TheraSkin was very adherent today. Very difficult to remove. Nevertheless the underlying tissue looked very healthy. Minimal undermining. TheraSkin #2 applied today 11/27/2020 upon evaluation today patient actually appears to be doing excellent in regard to her wound. We are using TheraSkin currently which seems to be doing great. With this is actually application #3. Overall the wound is significantly smaller and I think we are headed in the right direction. 12/8; TheraSkin #4. Really dramatic improvement I had not seen this in about Sarah month. There is no depth 12/21; wound is too small for another TheraSkin 01/09/2021; the patient comes in with the same pinpoint spot and with what looks like surrounding slough. I think  I gently debrided this last time however under intense illumination it looks as though the surface of this is all epithelialized although not particularly robust Objective Constitutional Sitting or standing Blood Pressure is within target range for patient.. Pulse regular and within target range for patient.Marland Kitchen Respirations regular, non-labored and within target range.. Temperature is normal and within the target range for the patient.Marland Kitchen Appears in no distress. Vitals Time Taken: 1:30 PM, Height: 67 in, Weight: 257 lbs, BMI: 40.2, Temperature: 98 F, Pulse: 77 bpm, Respiratory Rate: 16 breaths/min, Blood Pressure: 106/74 mmHg. General Notes: Wound exam; as mentioned there is Sarah pinpoint area that did not look completely closed with what looks to be some small [Sarah few millimeters] area of slough however under intense illumination this is all covered by surface epithelialization. Absolutely no debridement Integumentary (Hair, Skin) Wound #1 status is Healed  - Epithelialized. Original cause of wound was Surgical Injury. The date acquired was: 01/16/2020. The wound has been in treatment 39 weeks. The wound is located on the Left Chest. The wound measures 0cm length x 0cm width x 0cm depth; 0cm^2 area and 0cm^3 volume. There is Fat Layer (Subcutaneous Tissue) exposed. There is no tunneling or undermining noted. There is Sarah small amount of serosanguineous drainage noted. The wound margin is distinct with the outline attached to the wound base. There is large (67-100%) pink granulation within the wound bed. There is no necrotic tissue within the wound bed. 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 - with Dr. Dellia Nims- to check area in two weeks. keep area covered with calicum alginate, gauze, tegaderm- change every two days. Bathing/ Shower/ Hygiene: May shower and wash wound with soap and water. 1. I am not convinced that this is still open although it looks fragile 2. Change the primary dressing to calcium alginate and T egaderm 3. This may be closed although we will continue to keep an eye on this for the next 2 to 4 weeks. 4. Sees her surgeon [name] tomorrow Electronic Signature(s) Signed: 01/09/2021 4:38:12 PM By: [name] MD Entered By: [name] on 01/09/2021 14:06:11 -------------------------------------------------------------------------------- SuperBill Details Patient Name: Date of Service: Sarah [name] 01/09/2021 Medical Record Number: [id number] Patient Account Number: [id number] Date of Birth/Sex: Treating RN: August 27, 1970 (51 y.o. 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 Facility Procedures CPT4 Code: [id number] Description: 99213 - WOUND CARE VISIT-LEV 3 EST PT Modifier: Quantity: 1 Physician Procedures : CPT4 Code Description Modifier [id number] 99213 - WC PHYS LEVEL 3 - EST PT ICD-10 Diagnosis Description T81.31XD Disruption of external operation (surgical) wound, not elsewhere classified, subsequent encounter L59.8 Other specified disorders of the skin  and subcutaneous tissue related to radiation D05.12 Intraductal carcinoma in situ of left breast Quantity: 1 Electronic Signature(s) Signed: 01/09/2021 4:38:12 PM By: [name] MD Entered By: [name] on 01/09/2021 14:06:30

## 2021-01-09 NOTE — Progress Notes (Signed)
Chalk, Tariya D. ([id number]) °Visit Report for 01/09/2021 °Arrival Information Details °Patient Name: Date of Service: °[name], [name] 01/09/2021 1:15 PM °Medical Record Number: [id number] °Patient Account Number: [id number] °Date of Birth/Sex: Treating RN: °01/02/1971 (51 y.o. F) Deaton, Bobbi °Primary Care : Johnson, Deborah Other Clinician: °Referring : °Treating /Extender: Robson, Michael °Johnson, Deborah °Weeks in Treatment: 39 °Visit Information History Since Last Visit °Added or deleted any medications: No °Patient Arrived: Ambulatory °Any new allergies or adverse reactions: No °Arrival Time: 13:33 °Had a fall or experienced change in No °Accompanied By: self °activities of daily living that may affect °Transfer Assistance: None °risk of falls: °Patient Identification Verified: Yes °Signs or symptoms of abuse/neglect since last visito No °Secondary Verification Process Completed: Yes °Hospitalized since last visit: No °Patient Requires Transmission-Based Precautions: No °Implantable device outside of the clinic excluding No °Patient Has Alerts: No °cellular tissue based products placed in the center °since last visit: °Has Dressing in Place as Prescribed: Yes °Pain Present Now: No °Electronic Signature(s) °Signed: 01/09/2021 5:33:22 PM By: Deaton, Bobbi RN, BSN °Entered By: Deaton, Bobbi on 01/09/2021 13:34:04 °-------------------------------------------------------------------------------- °Clinic Level of Care Assessment Details °Patient Name: Date of Service: °[name], [name] 01/09/2021 1:15 PM °Medical Record Number: [id number] °Patient Account Number: [id number] °Date of Birth/Sex: Treating RN: °01/12/1970 (51 y.o. F) Deaton, Bobbi °Primary Care : Johnson, Deborah Other Clinician: °Referring : °Treating /Extender: Robson, Michael °Johnson, Deborah °Weeks in Treatment: 39 °Clinic Level of Care Assessment Items °TOOL 4 Quantity Score °X- 1 0 °Use when  only an EandM is performed on FOLLOW-UP visit °ASSESSMENTS - Nursing Assessment / Reassessment °X- 1 10 °Reassessment of Co-morbidities (includes updates in patient status) °X- 1 5 °Reassessment of Adherence to Treatment Plan °ASSESSMENTS - Wound and Skin A ssessment / Reassessment °X - Simple Wound Assessment / Reassessment - one wound 1 5 °[] - 0 °Complex Wound Assessment / Reassessment - multiple wounds °X- 1 10 °Dermatologic / Skin Assessment (not related to wound area) °ASSESSMENTS - Focused Assessment °[] - 0 °Circumferential Edema Measurements - multi extremities °X- 1 10 °Nutritional Assessment / Counseling / Intervention °[] - 0 °Lower Extremity Assessment (monofilament, tuning fork, pulses) °[] - 0 °Peripheral Arterial Disease Assessment (using hand held doppler) °ASSESSMENTS - Ostomy and/or Continence Assessment and Care °[] - 0 °Incontinence Assessment and Management °[] - 0 °Ostomy Care Assessment and Management (repouching, etc.) °PROCESS - Coordination of Care °X - Simple Patient / Family Education for ongoing care 1 15 °[] - 0 °Complex (extensive) Patient / Family Education for ongoing care °X- 1 10 °Staff obtains Consents, Records, T Results / Process Orders °est °[] - 0 °Staff telephones HHA, Nursing Homes / Clarify orders / etc °[] - 0 °Routine Transfer to another Facility (non-emergent condition) °[] - 0 °Routine Hospital Admission (non-emergent condition) °[] - 0 °New Admissions / Insurance Authorizations / Ordering NPWT Apligraf, etc. °, °[] - 0 °Emergency Hospital Admission (emergent condition) °X- 1 10 °Simple Discharge Coordination °[] - 0 °Complex (extensive) Discharge Coordination °PROCESS - Special Needs °[] - 0 °Pediatric / Minor Patient Management °[] - 0 °Isolation Patient Management °[] - 0 °Hearing / Language / Visual special needs °[] - 0 °Assessment of Community assistance (transportation, D/C planning, etc.) °[] - 0 °Additional assistance / Altered mentation °[] - 0 °Support  Surface(s) Assessment (bed, cushion, seat, etc.) °INTERVENTIONS - Wound Cleansing / Measurement °X - Simple Wound Cleansing - one wound 1 5 °[] -   0 °Complex Wound Cleansing - multiple wounds °X- 1 5 °Wound Imaging (photographs - any number of wounds) °[] - 0 °Wound Tracing (instead of photographs) °X- 1 5 °Simple Wound Measurement - one wound °[] - 0 °Complex Wound Measurement - multiple wounds °INTERVENTIONS - Wound Dressings °X - Small Wound Dressing one or multiple wounds 1 10 °[] - 0 °Medium Wound Dressing one or multiple wounds °[] - 0 °Large Wound Dressing one or multiple wounds °[] - 0 °Application of Medications - topical °[] - 0 °Application of Medications - injection °INTERVENTIONS - Miscellaneous °[] - 0 °External ear exam °[] - 0 °Specimen Collection (cultures, biopsies, blood, body fluids, etc.) °[] - 0 °Specimen(s) / Culture(s) sent or taken to Lab for analysis °[] - 0 °Patient Transfer (multiple staff / Hoyer Lift / Similar devices) °[] - 0 °Simple Staple / Suture removal (25 or less) °[] - 0 °Complex Staple / Suture removal (26 or more) °[] - 0 °Hypo / Hyperglycemic Management (close monitor of Blood Glucose) °[] - 0 °Ankle / Brachial Index (ABI) - do not check if billed separately °X- 1 5 °Vital Signs °Has the patient been seen at the hospital within the last three years: Yes °Total Score: 105 °Level Of Care: New/Established - Level 3 °Electronic Signature(s) °Signed: 01/09/2021 5:33:22 PM By: Deaton, Bobbi RN, BSN °Entered By: Deaton, Bobbi on 01/09/2021 13:46:35 °-------------------------------------------------------------------------------- °Encounter Discharge Information Details °Patient Name: Date of Service: °[name], [name] 01/09/2021 1:15 PM °Medical Record Number: [id number] °Patient Account Number: [id number] °Date of Birth/Sex: Treating RN: °01/28/1970 (51 y.o. F) Deaton, Bobbi °Primary Care : Johnson, Deborah Other Clinician: °Referring : °Treating /Extender:  Robson, Michael °Johnson, Deborah °Weeks in Treatment: 39 °Encounter Discharge Information Items °Discharge Condition: Stable °Ambulatory Status: Ambulatory °Discharge Destination: Home °Transportation: Private Auto °Accompanied By: self °Schedule Follow-up Appointment: Yes °Clinical Summary of Care: °Electronic Signature(s) °Signed: 01/09/2021 5:33:22 PM By: Deaton, Bobbi RN, BSN °Entered By: Deaton, Bobbi on 01/09/2021 13:47:41 °-------------------------------------------------------------------------------- °Lower Extremity Assessment Details °Patient Name: Date of Service: °[name], [name] 01/09/2021 1:15 PM °Medical Record Number: [id number] °Patient Account Number: [id number] °Date of Birth/Sex: Treating RN: °11/01/1970 (50 y.o. F) Deaton, Bobbi °Primary Care : Johnson, Deborah Other Clinician: °Referring : °Treating /Extender: Robson, Michael °Johnson, Deborah °Weeks in Treatment: 39 °Electronic Signature(s) °Signed: 01/09/2021 5:33:22 PM By: Deaton, Bobbi RN, BSN °Entered By: Deaton, Bobbi on 01/09/2021 13:34:45 °-------------------------------------------------------------------------------- °Multi Wound Chart Details °Patient Name: Date of Service: °[name], [name] 01/09/2021 1:15 PM °Medical Record Number: [id number] °Patient Account Number: [id number] °Date of Birth/Sex: °Treating RN: °02/18/1970 (50 y.o. F) Deaton, Bobbi °Primary Care : Johnson, Deborah °Other Clinician: °Referring : °Treating /Extender: Robson, Michael °Johnson, Deborah °Weeks in Treatment: 39 °Vital Signs °Height(in): 67 °Pulse(bpm): 77 °Weight(lbs): 257 °Blood Pressure(mmHg): 106/74 °Body Mass Index(BMI): 40 °Temperature(Â°F): 98 °Respiratory Rate(breaths/min): 16 °Photos: [N/A:N/A] °Left Chest N/A N/A °Wound Location: °Surgical Injury N/A N/A °Wounding Event: °Open Surgical Wound N/A N/A °Primary Etiology: °Necrosis (Radiation) N/A N/A °Secondary Etiology: °Hypertension,  Osteoarthritis, ReceivedN/A N/A °Comorbid History: °Chemotherapy, Received Radiation °01/16/2020 N/A N/A °Date Acquired: °39 N/A N/A °Weeks of Treatment: °Healed - Epithelialized N/A N/A °Wound Status: °0x0x0 N/A N/A °Measurements L x W x D (cm) °0 N/A N/A °A (cm²) : °rea °0 N/A N/A °Volume (cm³) : °100.00% N/A N/A °% Reduction in Area: °100.00% N/A N/A °% Reduction in Volume: °Full Thickness Without Exposed N/A N/A °Classification: °Support Structures °Small   N/A N/A °Exudate Amount: °Serosanguineous N/A N/A °Exudate Type: °red, brown N/A N/A °Exudate Color: °Distinct, outline attached N/A N/A °Wound Margin: °Large (67-100%) N/A N/A °Granulation Amount: °Pink N/A N/A °Granulation Quality: °None Present (0%) N/A N/A °Necrotic Amount: °Fat Layer (Subcutaneous Tissue): Yes N/A N/A °Exposed Structures: °Fascia: No °Tendon: No °Muscle: No °Joint: No °Bone: No °Large (67-100%) N/A N/A °Epithelialization: °Treatment Notes °Wound #1 (Chest) Wound Laterality: Left °Cleanser °Peri-Wound Care °Topical °Primary Dressing °Secondary Dressing °Secured With °Compression Wrap °Compression Stockings °Add-Ons °Notes °calcium alginate gauze, tegaderm. °Electronic Signature(s) °Signed: 01/09/2021 4:38:12 PM By: Robson, Michael MD °Signed: 01/09/2021 5:33:22 PM By: Deaton, Bobbi RN, BSN °Entered By: Robson, Michael on 01/09/2021 14:03:06 °-------------------------------------------------------------------------------- °Multi-Disciplinary Care Plan Details °Patient Name: °Date of Service: °[name], [name] 01/09/2021 1:15 PM °Medical Record Number: [id number] °Patient Account Number: [id number] °Date of Birth/Sex: °Treating RN: °06/09/1970 (50 y.o. F) Deaton, Bobbi °Primary Care : Johnson, Deborah °Other Clinician: °Referring : °Treating /Extender: Robson, Michael °Johnson, Deborah °Weeks in Treatment: 39 °Active Inactive °Wound/Skin Impairment °Nursing Diagnoses: °Impaired tissue integrity °Knowledge deficit related  to smoking impact on wound healing °Knowledge deficit related to ulceration/compromised skin integrity °Goals: °Patient will demonstrate a reduced rate of smoking or cessation of smoking °Date Initiated: 04/09/2020 °Date Inactivated: 06/18/2020 °Target Resolution Date: 06/28/2020 °Goal Status: Met °Patient will have a decrease in wound volume by X% from date: (specify in notes) °Date Initiated: 04/09/2020 °Date Inactivated: 07/03/2020 °Target Resolution Date: 07/05/2020 °Goal Status: Met °Patient/caregiver will verbalize understanding of skin care regimen °Date Initiated: 04/09/2020 °Target Resolution Date: 01/31/2021 °Goal Status: Active °Ulcer/skin breakdown will have a volume reduction of 30% by week 4 °Date Initiated: 04/09/2020 °Date Inactivated: 05/14/2020 °Target Resolution Date: 05/11/2020 °Unmet Reason: see wound °Goal Status: Unmet °meaurements. °Interventions: °Assess patient/caregiver ability to obtain necessary supplies °Assess patient/caregiver ability to perform ulcer/skin care regimen upon admission and as needed °Assess ulceration(s) every visit °Provide education on smoking °Notes: °Electronic Signature(s) °Signed: 01/09/2021 5:33:22 PM By: Deaton, Bobbi RN, BSN °Entered By: Deaton, Bobbi on 01/09/2021 13:39:13 °-------------------------------------------------------------------------------- °Pain Assessment Details °Patient Name: °Date of Service: °[name], [name] 01/09/2021 1:15 PM °Medical Record Number: [id number] °Patient Account Number: [id number] °Date of Birth/Sex: °Treating RN: °06/24/1970 (50 y.o. F) Deaton, Bobbi °Primary Care : Johnson, Deborah °Other Clinician: °Referring : °Treating /Extender: Robson, Michael °Johnson, Deborah °Weeks in Treatment: 39 °Active Problems °Location of Pain Severity and Description of Pain °Patient Has Paino No °Site Locations °Rate the pain. °Current Pain Level: 0 °Pain Management and Medication °Current Pain Management: °Medication: No °Cold  Application: No °Rest: No °Massage: No °Activity: No °T.E.N.S.: No °Heat Application: No °Leg drop or elevation: No °Is the Current Pain Management Adequate: Adequate °How does your wound impact your activities of daily livingo °Sleep: No °Bathing: No °Appetite: No °Relationship With Others: No °Bladder Continence: No °Emotions: No °Bowel Continence: No °Work: No °Toileting: No °Drive: No °Dressing: No °Hobbies: No °Electronic Signature(s) °Signed: 01/09/2021 5:33:22 PM By: Deaton, Bobbi RN, BSN °Entered By: Deaton, Bobbi on 01/09/2021 13:34:38 °-------------------------------------------------------------------------------- °Patient/Caregiver Education Details °Patient Name: °Date of Service: °[name], [name] 1/5/2023andnbsp1:15 PM °Medical Record Number: [id number] °Patient Account Number: [id number] °Date of Birth/Gender: °Treating RN: °12/12/1970 (50 y.o. F) Deaton, Bobbi °Primary Care Physician: Johnson, Deborah °Other Clinician: °Referring Physician: °Treating Physician/Extender: Robson, Michael °Johnson, Deborah °Weeks in Treatment: 39 °Education Assessment °Education Provided To: °Patient °Education Topics Provided °Wound/Skin Impairment: °Handouts: Skin Care Do's and Dont's °  Methods: Explain/Verbal Responses: Reinforcements needed Electronic Signature(s) Signed: 01/09/2021 5:33:22 PM By: Deon Pilling RN, BSN Entered By: Deon Pilling on 01/09/2021 13:39:32 -------------------------------------------------------------------------------- Wound Assessment Details Patient Name: Date of Service: [name] 01/09/2021 1:15 PM Medical Record Number: [id number] Patient Account Number: [id number] Date of Birth/Sex: Treating RN: 02/20/1970 (52 y.o. [name], [name] Primary Care Aleyza Salmi: [name] Other Clinician: Referring Natane Heward: Treating Colletta Spillers/Extender: [name] in Treatment: 39 Wound Status Wound Number: 1 Primary Open Surgical  Wound Etiology: Wound Location: Left Chest Secondary Necrosis (Radiation) Wounding Event: Surgical Injury Etiology: Date Acquired: 01/16/2020 Wound Status: Healed - Epithelialized Weeks Of Treatment: 39 Comorbid Hypertension, Osteoarthritis, Received Chemotherapy, Clustered Wound: No History: Received Radiation Photos Wound Measurements Length: (cm) Width: (cm) Depth: (cm) Area: (cm) Volume: (cm) 0 % Reduction in Area: 100% 0 % Reduction in Volume: 100% 0 Epithelialization: Large (67-100%) 0 Tunneling: No 0 Undermining: No Wound Description Classification: Full Thickness Without Exposed Support Structures Wound Margin: Distinct, outline attached Exudate Amount: Small Exudate Type: Serosanguineous Exudate Color: red, brown Foul Odor After Cleansing: No Slough/Fibrino No Wound Bed Granulation Amount: Large (67-100%) Exposed Structure Granulation Quality: Pink Fascia Exposed: No Necrotic Amount: None Present (0%) Fat Layer (Subcutaneous Tissue) Exposed: Yes Tendon Exposed: No Muscle Exposed: No Joint Exposed: No Bone Exposed: No Treatment Notes Wound #1 (Chest) Wound Laterality: Left Cleanser Peri-Wound Care Topical Primary Dressing Secondary Dressing Secured With Compression Wrap Compression Stockings Add-Ons Notes calcium alginate gauze, tegaderm. Electronic Signature(s) Signed: 01/09/2021 5:33:22 PM By: Deon Pilling RN, BSN Entered By: Deon Pilling on 01/09/2021 13:43:10 -------------------------------------------------------------------------------- Vitals Details Patient Name: Date of Service: [name] 01/09/2021 1:15 PM Medical Record Number: [id number] Patient Account Number: [id number] Date of Birth/Sex: Treating RN: 01/08/1970 (51 y.o. [name], [name] Primary Care Corneilus Heggie: [name] Other Clinician: Referring Andon Villard: Treating Gabryela Kimbrell/Extender: [name] in Treatment: 39 Vital  Signs Time Taken: 13:30 Temperature (F): 98 Height (in): 67 Pulse (bpm): 77 Weight (lbs): 257 Respiratory Rate (breaths/min): 16 Body Mass Index (BMI): 40.2 Blood Pressure (mmHg): 106/74 Reference Range: 80 - 120 mg / dl Electronic Signature(s) Signed: 01/09/2021 5:33:22 PM By: Deon Pilling RN, BSN Entered By: Deon Pilling on 01/09/2021 13:34:28

## 2021-01-10 ENCOUNTER — Other Ambulatory Visit (HOSPITAL_COMMUNITY): Payer: Self-pay

## 2021-01-10 ENCOUNTER — Other Ambulatory Visit: Payer: Self-pay

## 2021-01-10 ENCOUNTER — Ambulatory Visit (AMBULATORY_SURGERY_CENTER): Payer: Medicaid Other | Admitting: *Deleted

## 2021-01-10 VITALS — Ht 67.5 in | Wt 256.0 lb

## 2021-01-10 DIAGNOSIS — Z86010 Personal history of colonic polyps: Secondary | ICD-10-CM

## 2021-01-10 MED ORDER — NA SULFATE-K SULFATE-MG SULF 17.5-3.13-1.6 GM/177ML PO SOLN
1.0000 | ORAL | 0 refills | Status: DC
  Filled 2021-01-10: qty 354, 1d supply, fill #0

## 2021-01-10 NOTE — Progress Notes (Signed)
Patient's pre-visit was done today over the phone with the patient. Name,DOB and address verified. Patient denies any allergies to Eggs and Soy. Patient denies any problems with anesthesia/sedation. Patient is not taking any diet pills or blood thinners. No home Oxygen. Prep instructions sent to pt's MyChart-pt aware.Patient understands to call us back with any questions or concerns. Patient is aware of our care-partner policy and [id number] safety protocol.   EMMI education assigned to the patient for the procedure, sent to [place].

## 2021-01-11 ENCOUNTER — Other Ambulatory Visit: Payer: Self-pay | Admitting: General Surgery

## 2021-01-13 ENCOUNTER — Encounter: Payer: Medicaid Other | Admitting: Physical Therapy

## 2021-01-13 ENCOUNTER — Encounter: Payer: Self-pay | Admitting: Oncology

## 2021-01-14 ENCOUNTER — Other Ambulatory Visit (HOSPITAL_COMMUNITY): Payer: Self-pay

## 2021-01-17 ENCOUNTER — Ambulatory Visit (AMBULATORY_SURGERY_CENTER): Payer: Medicaid Other | Admitting: Gastroenterology

## 2021-01-17 ENCOUNTER — Telehealth: Payer: Self-pay | Admitting: Gynecologic Oncology

## 2021-01-17 ENCOUNTER — Other Ambulatory Visit: Payer: Self-pay

## 2021-01-17 ENCOUNTER — Encounter: Payer: Self-pay | Admitting: Gastroenterology

## 2021-01-17 VITALS — BP 120/69 | HR 75 | Temp 98.2°F | Resp 17 | Ht 67.5 in | Wt 256.0 lb

## 2021-01-17 DIAGNOSIS — Z86010 Personal history of colonic polyps: Secondary | ICD-10-CM | POA: Diagnosis not present

## 2021-01-17 DIAGNOSIS — D128 Benign neoplasm of rectum: Secondary | ICD-10-CM | POA: Diagnosis not present

## 2021-01-17 DIAGNOSIS — D122 Benign neoplasm of ascending colon: Secondary | ICD-10-CM

## 2021-01-17 MED ORDER — SODIUM CHLORIDE 0.9 % IV SOLN: 500.0000 mL | Freq: Once | INTRAVENOUS | Status: DC

## 2021-01-17 NOTE — Progress Notes (Signed)
PT taken to PACU. Monitors in place. VSS. Report given to RN. 

## 2021-01-17 NOTE — Progress Notes (Signed)
Called to room to assist during endoscopic procedure.  Patient ID and intended procedure confirmed with present staff. Received instructions for my participation in the procedure from the performing physician.  

## 2021-01-17 NOTE — Patient Instructions (Signed)
YOU HAD AN ENDOSCOPIC PROCEDURE TODAY AT [facility]:   Refer to the procedure report that was given to you for any specific questions about what was found during the examination.  If the procedure report does not answer your questions, please call your gastroenterologist to clarify.  If you requested that your care partner not be given the details of your procedure findings, then the procedure report has been included in a sealed envelope for you to review at your convenience later.  **Handout given on polyps**  YOU SHOULD EXPECT: Some feelings of bloating in the abdomen. Passage of more gas than usual.  Walking can help get rid of the air that was put into your GI tract during the procedure and reduce the bloating. If you had a lower endoscopy (such as a colonoscopy or flexible sigmoidoscopy) you may notice spotting of blood in your stool or on the toilet paper. If you underwent a bowel prep for your procedure, you may not have a normal bowel movement for a few days.  Please Note:  You might notice some irritation and congestion in your nose or some drainage.  This is from the oxygen used during your procedure.  There is no need for concern and it should clear up in a day or so.  SYMPTOMS TO REPORT IMMEDIATELY:  Following lower endoscopy (colonoscopy or flexible sigmoidoscopy):  Excessive amounts of blood in the stool  Significant tenderness or worsening of abdominal pains  Swelling of the abdomen that is new, acute  Fever of 100F or higher   For urgent or emergent issues, a gastroenterologist can be reached at any hour by calling [phone]. Do not use MyChart messaging for urgent concerns.    DIET:  We do recommend a small meal at first, but then you may proceed to your regular diet.  Drink plenty of fluids but you should avoid alcoholic beverages for 24 hours.  ACTIVITY:  You should plan to take it easy for the rest of today and you should NOT DRIVE or use heavy  machinery until tomorrow (because of the sedation medicines used during the test).    FOLLOW UP: Our staff will call the number listed on your records 48-72 hours following your procedure to check on you and address any questions or concerns that you may have regarding the information given to you following your procedure. If we do not reach you, we will leave a message.  We will attempt to reach you two times.  During this call, we will ask if you have developed any symptoms of COVID 19. If you develop any symptoms (ie: fever, flu-like symptoms, shortness of breath, cough etc.) before then, please call [phone].  If you test positive for Covid 19 in the 2 weeks post procedure, please call and report this information to [place].    If any biopsies were taken you will be contacted by phone or by letter within the next 1-3 weeks.  Please call us at [phone] if you have not heard about the biopsies in 3 weeks.    SIGNATURES/CONFIDENTIALITY: You and/or your care partner have signed paperwork which will be entered into your electronic medical record.  These signatures attest to the fact that that the information above on your After Visit Summary has been reviewed and is understood.  Full responsibility of the confidentiality of this discharge information lies with you and/or your care-partner.

## 2021-01-17 NOTE — Progress Notes (Signed)
Review of pertinent gastrointestinal problems: 1.  History of adenomatous colon polyps.  Colonoscopy 12/2019 found a 2.5 cm sessile, centrally umbilicated polyp in the ascending segment, adjacent to the IC valve.  This was a tubular adenoma without high-grade dysplasia on biopsies.  Repeat colonoscopy in hospital setting January 2022 same 25 mm polyp was found.  It was lifted with pill rise submucosal injection and then resected using snare cautery and piecemeal fashion.  Resection and retrieval.  Complete.  Polypectomy defect site was closed using placement of 5 endoclips.  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  Constitutional:  No unintentional weight loss   Past Medical History:  Diagnosis Date   Arthritis    Breast cancer ([place])    Left Breast Cancer   Breast cancer ([place]) 16/60/6004   left    Complication of anesthesia    DAY SURGERY 2010 OR 2011 ASPIRATED AND STAYED OVERNIGHT   History of radiation therapy 03/11/17- 04/21/17   Left breast, 2 Gy in 25 fractions for a total dose of 50 Gy, Boost, 2 Gy in 5 fractions for a total dose of 10 Gy   Hypertension    OFF MEDS  SINCE CHEMO X 5 MONTHS   Malignant neoplasm of upper-outer quadrant of left breast in female, estrogen receptor negative ([place]) 06/23/2016   Personal history of chemotherapy 2019   Left Breast Cancer  07-2016-12-2016   Personal history of radiation therapy 2019   Left Breast Cancer    Past Surgical History:  Procedure Laterality Date   ANKLE SURGERY Right    BREAST CYST EXCISION Right 07/15/2020   Procedure: Excision excess right axillary tissue;  Surgeon: Cindra Presume, MD;  Location: [facility];  Service: Plastics;  Laterality: Right;   BREAST LUMPECTOMY Left 01/27/2017   BREAST LUMPECTOMY WITH RADIOACTIVE SEED AND SENTINEL LYMPH NODE BIOPSY Left 01/27/2017   Procedure: LEFT BREAST  LUMPECTOMY WITH RADIOACTIVE SEED LOCALIZATION, LEFT AXILLARY DEEP LYMPH NODE BIOPSY WITH RADIOACTIVE SEED LOCALIZATION, LEFT AXILLARY SENTINEL LYMPH NODE MAPPING AND BIOPSY WITH BLUE DYE INJECTION;  Surgeon: [name], MD;  Location: [facility];  Service: General;  Laterality: Left;   COLONOSCOPY WITH PROPOFOL N/A 02/01/2020   Procedure: COLONOSCOPY WITH PROPOFOL;  Surgeon: Milus Banister, MD;  Location: [facility];  Service: Endoscopy;  Laterality: N/A;   DEBRIDEMENT AND CLOSURE WOUND Left 03/28/2020   Procedure: DEBRIDEMENT AND CLOSURE OF MASTECTOMY WOUND;  Surgeon: Rolm Bookbinder, MD;  Location: [facility];  Service: General;  Laterality: Left;   DEBRIDEMENT AND CLOSURE WOUND Left 07/15/2020   Procedure: Debridement of left chest wound and rotational flap closure;  Surgeon: Cindra Presume, MD;  Location: [facility];  Service: Plastics;  Laterality: Left;  1.5 hour   ENDOSCOPIC MUCOSAL RESECTION  02/01/2020   Procedure: ENDOSCOPIC MUCOSAL RESECTION;  Surgeon: Milus Banister, MD;  Location: [facility];  Service: Endoscopy;;   HEMOSTASIS CLIP PLACEMENT  02/01/2020   Procedure: HEMOSTASIS CLIP PLACEMENT;  Surgeon: Milus Banister, MD;  Location: [facility];  Service: Endoscopy;;   IR FLUORO GUIDE PORT INSERTION RIGHT  07/14/2016   IR US GUIDE VASC ACCESS RIGHT  07/14/2016   MASTECTOMY W/ SENTINEL NODE BIOPSY Bilateral 01/09/2020   Procedure: BILATERAL MASTECTOMY WITH LEFT AXILLARY SENTINEL LYMPH NODE BIOPSY;  Surgeon: Rolm Bookbinder, MD;  Location: [facility];  Service: General;  Laterality: Bilateral;  PORT-A-CATH REMOVAL Right 01/27/2017   Procedure: REMOVAL PORT-A-CATH;  Surgeon: [name], MD;  Location: [facility];  Service: General;  Laterality: Right;   PORTACATH PLACEMENT Right 01/09/2020   Procedure: INSERTION PORT-A-CATH;  Surgeon: Rolm Bookbinder, MD;  Location: [facility];  Service: General;  Laterality: Right;   SUBMUCOSAL LIFTING INJECTION   02/01/2020   Procedure: SUBMUCOSAL LIFTING INJECTION;  Surgeon: Milus Banister, MD;  Location: [facility];  Service: Endoscopy;;   TUBAL LIGATION     WISDOM TOOTH EXTRACTION      Current Outpatient Medications  Medication Sig Dispense Refill   docusate sodium (COLACE) 100 MG capsule Take 100 mg by mouth daily.     gabapentin (NEURONTIN) 100 MG capsule TAKE 2 CAPSULES BY MOUTH IN THE MORNING AND 2 CAPSULES IN THE AFTERNOON (Patient taking differently: as needed. PRN) 240 capsule 3   gabapentin (NEURONTIN) 300 MG capsule Take 3 capsules (900 mg total) by mouth at bedtime. 180 capsule 4   Na Sulfate-K Sulfate-Mg Sulf 17.5-3.13-1.6 GM/177ML SOLN Take 1 kit by mouth as directed. 354 mL 0   olaparib (LYNPARZA) 150 MG tablet Take 2 tablets (300 mg total) by mouth 2 (two) times daily. Swallow whole. May take with food to decrease nausea and vomiting.Start May 23, 2020 120 tablet 6   ondansetron (ZOFRAN) 4 MG tablet Take 4 mg by mouth every 8 (eight) hours as needed for nausea or vomiting.     potassium chloride SA (KLOR-CON M) 20 MEQ tablet TAKE 1 TABLET BY MOUTH ONCE A DAY 30 tablet 2   prochlorperazine (COMPAZINE) 10 MG tablet Take 1 tablet (10 mg total) by mouth every 6 (six) hours as needed for nausea or vomiting. (Patient not taking: Reported on 01/10/2021) 30 tablet 0   traMADol (ULTRAM) 50 MG tablet TAKE 1 TO 2 TABLETS BY MOUTH EVERY 6 HOURS AS NEEDED. 120 tablet 0   Current Facility-Administered Medications  Medication Dose Route Frequency Provider Last Rate Last Admin   0.9 %  sodium chloride infusion  500 mL Intravenous Once Milus Banister, MD        Allergies as of 01/17/2021 - Review Complete 01/17/2021  Allergen Reaction Noted   Carboplatin Other (See Comments) 01/20/2017    Family History  Problem Relation Age of Onset   Diabetes Mother    Hypertension Mother    Arthritis Mother    Colon cancer Father 96       died .61 metastatic at time of diagnosis   Diabetes Father     Heart disease Father    Hypertension Father    Breast cancer Maternal Grandmother 46       d.37s   Breast cancer Maternal Aunt 52   Cervical cancer Maternal Aunt 55   Breast cancer Maternal Aunt 46       d.50s   Cancer Maternal Uncle        d.62s unspecified type of cancer   Breast cancer Cousin 70       paternal first-cousin (daughter of unaffected aunt)   Cancer Maternal Aunt 23       "Female Cancer"   Cancer Maternal Aunt        unknown cancer   Brain cancer Daughter 34   Esophageal cancer Other    Colon polyps Neg Hx    Rectal cancer Neg Hx    Stomach cancer Neg Hx     Social History   Socioeconomic History   Marital status: Married  Spouse name: Not on file   Number of children: Not on file   Years of education: Not on file   Highest education level: Not on file  Occupational History   Not on file  Tobacco Use   Smoking status: Never   Smokeless tobacco: Never  Vaping Use   Vaping Use: Never used  Substance and Sexual Activity   Alcohol use: No   Drug use: No   Sexual activity: Not Currently  Other Topics Concern   Not on file  Social History Narrative   Not on file   Social Determinants of Health   Financial Resource Strain: Not on file  Food Insecurity: Not on file  Transportation Needs: Not on file  Physical Activity: Not on file  Stress: Not on file  Social Connections: Not on file  Intimate Partner Violence: Not on file     Physical Exam: BP 136/73    Pulse 89    Temp 98.2 F (36.8 C)    Ht 5' 7.5" (1.715 m)    Wt 256 lb (116.1 kg)    LMP  (LMP Unknown)    SpO2 97%    BMI 39.50 kg/m  Constitutional: generally well-appearing Psychiatric: alert and oriented x3 Lungs: CTA bilaterally Heart: no MCR  Assessment and plan: 51 y.o. female with history of colon polyps  Surveillance colonoscopy today  Care is appropriate for the ambulatory setting.  Owens Loffler, MD [facility] 01/17/2021, 1:19 PM

## 2021-01-17 NOTE — Op Note (Signed)
[facility] Patient Name: [name] Procedure Date: 01/17/2021 1:33 PM MRN: [id number] Endoscopist: Milus Banister , MD Age: 51 Referring MD:  Date of Birth: 1970/10/13 Gender: Female Account #: [id number] Procedure:                Colonoscopy Indications:              High risk colon cancer surveillance: Personal                            history of colonic polyps Colonoscopy 12/2019 found                            a 2.5 cm sessile, centrally umbilicated polyp in                            the ascending segment, adjacent to the IC valve.                            This was a tubular adenoma without high-grade                            dysplasia on biopsies. Repeat colonoscopy in                            hospital setting January 2022 same 25 mm polyp was                            found. It was lifted with pill rise submucosal                            injection and then resected using snare cautery and                            piecemeal fashion. Resection and retrieval.                            Complete. Polypectomy defect site was closed using                            placement of 5 endoclips. Pathology shows                            tubulovillous adenoma without high-grade dysplasia Medicines:                Monitored Anesthesia Care Procedure:                Pre-Anesthesia Assessment:                           - Prior to the procedure, a History and Physical                            was performed, and patient medications and  allergies were reviewed. The patient's tolerance of                            previous anesthesia was also reviewed. The risks                            and benefits of the procedure and the sedation                            options and risks were discussed with the patient.                            All questions were answered, and informed consent                            was obtained. Prior  Anticoagulants: The patient has                            taken no previous anticoagulant or antiplatelet                            agents. ASA Grade Assessment: II - A patient with                            mild systemic disease. After reviewing the risks                            and benefits, the patient was deemed in                            satisfactory condition to undergo the procedure.                           After obtaining informed consent, the colonoscope                            was passed under direct vision. Throughout the                            procedure, the patient's blood pressure, pulse, and                            oxygen saturations were monitored continuously. The                            Olympus CF-HQ190L [id number]) Colonoscope was                            introduced through the anus and advanced to the the                            cecum, identified by appendiceal orifice and  ileocecal valve. The colonoscopy was performed                            without difficulty. The patient tolerated the                            procedure well. The quality of the bowel                            preparation was good. The ileocecal valve,                            appendiceal orifice, and rectum were photographed. Scope In: 1:39:36 PM Scope Out: 1:53:35 PM Scope Withdrawal Time: 0 hours 12 minutes 9 seconds  Total Procedure Duration: 0 hours 13 minutes 59 seconds  Findings:                 A 5 mm polyp was found in the ascending colon (this                            was clearly recurrent polyp, at the site of the                            01/2020 polypectomy. The polyp was sessile. The                            polyp was removed with a hot snare. Resection and                            retrieval were complete.                           A 4 mm polyp was found in the recto-sigmoid colon.                            The polyp  was sessile. The polyp was removed with a                            cold snare. Resection and retrieval were complete.                           The exam was otherwise without abnormality on                            direct and retroflexion views. Complications:            No immediate complications. Estimated blood loss:                            None. Estimated Blood Loss:     Estimated blood loss: none. Impression:               - One 5 mm polyp (recurrent at the site of 01/2020  polypectomy) in the ascending colon, removed with a                            hot snare. Resected and retrieved.                           - One 4 mm polyp at the recto-sigmoid colon,                            removed with a cold snare. Resected and retrieved.                           - The examination was otherwise normal on direct                            and retroflexion views. Recommendation:           - Patient has a contact number available for                            emergencies. The signs and symptoms of potential                            delayed complications were discussed with the                            patient. Return to normal activities tomorrow.                            Written discharge instructions were provided to the                            patient.                           - Resume previous diet.                           - Continue present medications.                           - Await pathology results. Milus Banister, MD 01/17/2021 1:57:26 PM This report has been signed electronically.

## 2021-01-17 NOTE — Telephone Encounter (Signed)
Called patient and informed her of potential surgery date of Feb 22 with Dr. Berline Lopes and [name]. She is fine to proceed with this date. In regards to the hysterectomy, she would like to think about it over the weekend and will call our office at the beginning of the week with an update. Advised we would also set her up for a preop appt in the office with myself closer to the surgery date. Advised to call for any needs as well.

## 2021-01-20 ENCOUNTER — Telehealth: Payer: Self-pay | Admitting: Physical Therapy

## 2021-01-20 ENCOUNTER — Ambulatory Visit: Payer: Medicaid Other | Attending: Oncology | Admitting: Physical Therapy

## 2021-01-20 DIAGNOSIS — R293 Abnormal posture: Secondary | ICD-10-CM | POA: Insufficient documentation

## 2021-01-20 DIAGNOSIS — M6281 Muscle weakness (generalized): Secondary | ICD-10-CM | POA: Insufficient documentation

## 2021-01-20 NOTE — Telephone Encounter (Signed)
Patient did not show for appointment.  Patient was called and PT left message to please call us back. She was reminded of no show policy  [employer], PT 01/20/21 3:13 PM

## 2021-01-21 ENCOUNTER — Telehealth: Payer: Self-pay

## 2021-01-21 ENCOUNTER — Telehealth: Payer: Self-pay | Admitting: *Deleted

## 2021-01-21 NOTE — Telephone Encounter (Signed)
Second post procedure follow up call, no answer 

## 2021-01-21 NOTE — Telephone Encounter (Signed)
First attempt, left VM.  

## 2021-01-22 ENCOUNTER — Encounter: Payer: Self-pay | Admitting: Gastroenterology

## 2021-01-23 ENCOUNTER — Telehealth: Payer: Self-pay | Admitting: *Deleted

## 2021-01-23 ENCOUNTER — Encounter (HOSPITAL_BASED_OUTPATIENT_CLINIC_OR_DEPARTMENT_OTHER): Payer: Medicaid Other | Admitting: Internal Medicine

## 2021-01-23 ENCOUNTER — Other Ambulatory Visit: Payer: Self-pay

## 2021-01-23 ENCOUNTER — Telehealth: Payer: Self-pay

## 2021-01-23 DIAGNOSIS — T8131XD Disruption of external operation (surgical) wound, not elsewhere classified, subsequent encounter: Secondary | ICD-10-CM | POA: Diagnosis not present

## 2021-01-23 NOTE — Telephone Encounter (Signed)
Per review of pending surgery for BSO with possible hysterectomy - and inquiry regarding anastrazole and lynparza stop dates.  Per Dr Lindi Adie pt should stop both medications 1 week prior to surgery and resume 1 week post surgery if no complications.  This note will be forwarded to the GYN onc dept navigator.

## 2021-01-23 NOTE — Progress Notes (Signed)
[name], LAVEY ([id number]) Visit Report for 01/23/2021 Arrival Information Details Patient Name: Date of Service: [name] 01/23/2021 12:30 PM Medical Record Number: [id number] Patient Account Number: [id number] Date of Birth/Sex: Treating RN: 03-28-1970 (51 y.o. [name], [name] Primary Care [name]: [name] Other Clinician: Referring Malaika Arnall: Treating Yerachmiel Spinney/Extender: [name] in Treatment: 71 Visit Information History Since Last Visit Added or deleted any medications: No Patient Arrived: Ambulatory Any new allergies or adverse reactions: No Arrival Time: 12:52 Had a fall or experienced change in No Accompanied By: self activities of daily living that may affect Transfer Assistance: None risk of falls: Patient Identification Verified: Yes Signs or symptoms of abuse/neglect since last visito No Secondary Verification Process Completed: Yes Hospitalized since last visit: No Patient Requires Transmission-Based Precautions: No Implantable device outside of the clinic excluding No Patient Has Alerts: No cellular tissue based products placed in the center since last visit: Pain Present Now: No Electronic Signature(s) Signed: 01/23/2021 5:47:38 PM By: Deon Pilling RN, BSN Entered By: Deon Pilling on 01/23/2021 12:52:22 -------------------------------------------------------------------------------- Clinic Level of Care Assessment Details Patient Name: Date of Service: A Sarah A L, [name] 01/23/2021 12:30 PM Medical Record Number: [id number] Patient Account Number: [id number] Date of Birth/Sex: Treating RN: 09-22-1970 (51 y.o. [name], [name] Primary Care Chadwin Fury: [name] Other Clinician: Referring [name]: Treating [name]/Extender: [name] in Treatment: 41 Clinic Level of Care Assessment Items TOOL 4 Quantity Score X- 1 0 Use when only an EandM is performed on  FOLLOW-UP visit ASSESSMENTS - Nursing Assessment / Reassessment X- 1 10 Reassessment of Co-morbidities (includes updates in patient status) X- 1 5 Reassessment of Adherence to Treatment Plan ASSESSMENTS - Wound and Skin A ssessment / Reassessment []  - 0 Simple Wound Assessment / Reassessment - one wound []  - 0 Complex Wound Assessment / Reassessment - multiple wounds X- 1 10 Dermatologic / Skin Assessment (not related to wound area) ASSESSMENTS - Focused Assessment []  - 0 Circumferential Edema Measurements - multi extremities []  - 0 Nutritional Assessment / Counseling / Intervention []  - 0 Lower Extremity Assessment (monofilament, tuning fork, pulses) []  - 0 Peripheral Arterial Disease Assessment (using hand held doppler) ASSESSMENTS - Ostomy and/or Continence Assessment and Care []  - 0 Incontinence Assessment and Management []  - 0 Ostomy Care Assessment and Management (repouching, etc.) PROCESS - Coordination of Care X - Simple Patient / Family Education for ongoing care 1 15 []  - 0 Complex (extensive) Patient / Family Education for ongoing care X- 1 10 Staff obtains Programmer, systems, Records, T Results / Process Orders est []  - 0 Staff telephones HHA, Nursing Homes / Clarify orders / etc []  - 0 Routine Transfer to another Facility (non-emergent condition) []  - 0 Routine Hospital Admission (non-emergent condition) []  - 0 New Admissions / Biomedical engineer / Ordering NPWT Apligraf, etc. , []  - 0 Emergency Hospital Admission (emergent condition) X- 1 10 Simple Discharge Coordination []  - 0 Complex (extensive) Discharge Coordination PROCESS - Special Needs []  - 0 Pediatric / Minor Patient Management []  - 0 Isolation Patient Management []  - 0 Hearing / Language / Visual special needs []  - 0 Assessment of Community assistance (transportation, D/C planning, etc.) []  - 0 Additional assistance / Altered mentation []  - 0 Support Surface(s) Assessment (bed, cushion,  seat, etc.) INTERVENTIONS - Wound Cleansing / Measurement []  - 0 Simple Wound Cleansing - one wound []  - 0 Complex Wound Cleansing - multiple wounds []  -  0 Wound Imaging (photographs - any number of wounds) []  - 0 Wound Tracing (instead of photographs) []  - 0 Simple Wound Measurement - one wound []  - 0 Complex Wound Measurement - multiple wounds INTERVENTIONS - Wound Dressings []  - 0 Small Wound Dressing one or multiple wounds []  - 0 Medium Wound Dressing one or multiple wounds []  - 0 Large Wound Dressing one or multiple wounds []  - 0 Application of Medications - topical []  - 0 Application of Medications - injection INTERVENTIONS - Miscellaneous []  - 0 External ear exam []  - 0 Specimen Collection (cultures, biopsies, blood, body fluids, etc.) []  - 0 Specimen(s) / Culture(s) sent or taken to Lab for analysis []  - 0 Patient Transfer (multiple staff / Civil Service fast streamer / Similar devices) []  - 0 Simple Staple / Suture removal (25 or less) []  - 0 Complex Staple / Suture removal (26 or more) []  - 0 Hypo / Hyperglycemic Management (close monitor of Blood Glucose) []  - 0 Ankle / Brachial Index (ABI) - do not check if billed separately X- 1 5 Vital Signs Has the patient been seen at the hospital within the last three years: Yes Total Score: 65 Level Of Care: New/Established - Level 2 Electronic Signature(s) Signed: 01/23/2021 5:47:38 PM By: Deon Pilling RN, BSN Entered By: Deon Pilling on 01/23/2021 12:56:19 -------------------------------------------------------------------------------- Encounter Discharge Information Details Patient Name: Date of Service: [name], [name] 01/23/2021 12:30 PM Medical Record Number: [id number] Patient Account Number: [id number] Date of Birth/Sex: Treating RN: 1970-06-17 (51 y.o. [name] Primary Care Isella Slatten: [name] Other Clinician: Referring Angelito Hopping: Treating Libero [name]/Extender: [name] in Treatment: [phone] Encounter Discharge Information Items Discharge Condition: Stable Ambulatory Status: Ambulatory Discharge Destination: Home Transportation: Private Auto Accompanied By: self Schedule Follow-up Appointment: No Clinical Summary of Care: Electronic Signature(s) Signed: 01/23/2021 5:47:38 PM By: Deon Pilling RN, BSN Entered By: Deon Pilling on 01/23/2021 12:58:45 -------------------------------------------------------------------------------- Lower Extremity Assessment Details Patient Name: Date of Service: [name], [name] 01/23/2021 12:30 PM Medical Record Number: [id number] Patient Account Number: [id number] Date of Birth/Sex: Treating RN: 04-Sep-1970 (51 y.o. [name] Primary Care Arieon Corcoran: [name] Other Clinician: Referring Asuka Dusseau: Treating Lc Joynt/Extender: [name] in Treatment: 02 Electronic Signature(s) Signed: 01/23/2021 5:47:38 PM By: Deon Pilling RN, BSN Entered By: Deon Pilling on 01/23/2021 12:53:01 -------------------------------------------------------------------------------- Multi-Disciplinary Care Plan Details Patient Name: Date of Service: [name], [name] 01/23/2021 12:30 PM Medical Record Number: [id number] Patient Account Number: [id number] Date of Birth/Sex: Treating RN: 07-04-1970 (51 y.o. [name] Primary Care Adela Esteban: Other Clinician: [name] Referring Crayton Savarese: Treating Madlyn Crosby/Extender: [name] in Treatment: 62 Active Inactive Electronic Signature(s) Signed: 01/23/2021 5:47:38 PM By: Deon Pilling RN, BSN Entered By: Deon Pilling on 01/23/2021 12:54:39 -------------------------------------------------------------------------------- Pain Assessment Details Patient Name: Date of Service: [name], [name] 01/23/2021 12:30 PM Medical Record Number: [id number] Patient Account Number: [id number] Date of  Birth/Sex: Treating RN: March 02, 1970 (51 y.o. [name] Primary Care Rhilynn Preyer: [name] Other Clinician: Referring Virtie Bungert: Treating Kaesyn Johnston/Extender: [name] in Treatment: 41 Active Problems Location of Pain Severity and Description of Pain Patient Has Paino No Site Locations Rate the pain. Current Pain Level: 0 Pain Management and Medication Current Pain Management: Medication: No Cold Application: No Rest: No Massage: No Activity: No T.E.N.S.: No Heat Application: No Leg drop or elevation: No Is the  Current Pain Management Adequate: Adequate How does your wound impact your activities of daily livingo Sleep: No Bathing: No Appetite: No Relationship With Others: No Bladder Continence: No Emotions: No Bowel Continence: No Work: No Toileting: No Drive: No Dressing: No Hobbies: No Engineer, maintenance) Signed: 01/23/2021 5:47:38 PM By: Deon Pilling RN, BSN Entered By: Deon Pilling on 01/23/2021 12:52:44 -------------------------------------------------------------------------------- Patient/Caregiver Education Details Patient Name: Date of Service: [name] 1/19/2023andnbsp12:30 PM Medical Record Number: [id number] Patient Account Number: [id number] Date of Birth/Gender: Treating RN: Feb 26, 1970 (52 y.o. [name] Primary Care Physician: [name] Other Clinician: Referring Physician: Treating Physician/Extender: [name] in Treatment: 21 Education Assessment Education Provided To: Patient Education Topics Provided Wound/Skin Impairment: Handouts: Skin Care Do's and Dont's Methods: Explain/Verbal Responses: Reinforcements needed Electronic Signature(s) Signed: 01/23/2021 5:47:38 PM By: Deon Pilling RN, BSN Entered By: Deon Pilling on 01/23/2021 12:55:05 -------------------------------------------------------------------------------- Vitals  Details Patient Name: Date of Service: [name], [name] 01/23/2021 12:30 PM Medical Record Number: [id number] Patient Account Number: [id number] Date of Birth/Sex: Treating RN: Feb 02, 1970 (52 y.o. [name], [name] Primary Care Magie Ciampa: [name] Other Clinician: Referring Shonta Phillis: Treating Le Ferraz/Extender: [name] in Treatment: 41 Vital Signs Time Taken: 12:52 Temperature (F): 98.8 Height (in): 67 Pulse (bpm): 89 Weight (lbs): 257 Respiratory Rate (breaths/min): 20 Body Mass Index (BMI): 40.2 Blood Pressure (mmHg): 105/74 Reference Range: 80 - 120 mg / dl Electronic Signature(s) Signed: 01/23/2021 5:47:38 PM By: Deon Pilling RN, BSN Entered By: Deon Pilling on 01/23/2021 12:52:36

## 2021-01-23 NOTE — Telephone Encounter (Signed)
Attempted to contact [name] regarding her upcoming surgery. Unable to reach patient and unable to leave voicemail.

## 2021-01-23 NOTE — Progress Notes (Signed)
[name], [name] ([id number]) Visit Report for 01/23/2021 HPI Details Patient Name: Date of Service: [name] 01/23/2021 12:30 PM Medical Record Number: [id number] Patient Account Number: [id number] Date of Birth/Sex: Treating RN: [birth year] (51 y.o. [name] Primary Care Provider: [name] Other Clinician: Referring Provider: Treating Provider/Extender: [name] in Treatment: 41 History of Present Illness HPI Description: After canADMISSION 04/09/2020 This is a 51 year old woman who is history of breast cancer in the left breast goes back to 2018. He was found to have a mass in her left breast. She underwent a left back lumpectomy and then subsequent chemo that she did not tolerate very well. She had seed implants placed but then underwent external beam a radiation in the spring 2019. 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 Her adjunctive radiation was from 03/11/2017 through 04/21/2017. Unfortunately she had a left breast biopsy on 11/24/2019 and it was again positive for an invasive ductal carcinoma grade 3 he is to resume at receptor 80% positive. HER-2 negative progesterone negative. She opted for bilateral mastectomies which were done on 01/09/2020 on the right with no evidence of malignancy on the left stage IIb invasive ductal carcinoma. She again received chemotherapy but she did not tolerate it because of wound dehiscence and low platelet counts according to the patient. Unfortunately her mastectomy wound dehisced on the left breast. She was taken back to the OR by [name] of general surgery on 33/24/22 and she underwent a debridement and subsequent attempt at closure. She had been previously treated with Augmentin for suspected underlying infection. Once again the wound has dehisced in the  mid aspect. It is roughly 1 cm deep there is tunnels both medially and laterally she. She still has sutures in place. Still describes constant pain drainage. She also has a drain still in place that drains about 10 cc/day. Currently she is changing ABD pad several times a day 4/12; patient went back to see [name] who remove most of the stitches and debrided her wound. She has been using silver alginate culture I did last time which was a PCR culture showed Peptostreptococcus, Enterococcus faecalis and Staphylococcus epidermidis which is of questionable significance. She has a larger wound now that the sutures have been removed. There are undermining areas superior medial superior lateral and a deep tunneling area in the inferior part of the wound. There is no obvious purulence. I started her on Augmentin on 04/12/2020 and I will likely extend this for another week AMAZINGLY; she is already been approved for HBO and will start today. [name] Echocardiogram which I reviewed from 2019 showed a normal ejection fraction.. I went over side effects of HBO with the patient and answered any of her questions. Her husband was present 5/3; its been a while since have seen this wound. She went to see [name] about 2-1/2 weeks ago according to the patient he can remove some of the surrounding debris. She will see him again later this week or early next week. She has been using silver alginate. A lot of this is problematic since she has Medicaid which traditionally does not cover wound care supplies although it traditionally does not cover HBO either Her wound actually looks quite a bit better than the last time I saw this better looking surface healthy looking  tissue. She has tunnels at 810 2 in 6 the deepest of these is at 2 at 2.2 cm there is no exposed bone. 5/10; the patient was coming in for nurse visit today but requested that I see the wound because of surface discoloration.  She states the Iodoflex makes the surface of this turned "white". She is not really complaining of undue tenderness. She is changing this every second day. The goal of this had been to get the surface of the wound to a vibrant red color and then potentially look at changing to a silver collagen-based dressing, Puraply, and then perhaps an alternative skin substituteo Derma vest. The patient tells me she has an appoint with her oncologist [name] on the 19th. She apparently think she is going to get another PET scan. Based on this I would like his comment about whether there is possibility of any malignancy in the wound itself or just underneath it. Up to now we have not really thought that was true 5/17; nice improvement in the chest wound. Less depth. We have been using Iodoflex to clean off the wound surface this seems to have done nicely. Patient went to see [name] 2 days ago who has referred her to see Dr. Claudia Desanctis. I certainly agree with the thought the surface of the wound looks a lot better now and I think consideration of plastic surgery is certainly something the patient should discuss. I have talked to the patient about this today and I certainly support a conversation with Dr. Claudia Desanctis. I also wondered about is there any residual cancer in this. [name] apparently did not seem to think so per the patient although I do not see that in the text conversations we have had. I also wonder about a skin substitute if her version of Medicare [[employer]] would cover this. [name] is apparently considering starting her on [place]. Patient is already very scared about the side effects but she will discuss this with [name] on 5/19 5/31; patient's wound about the same in terms of size she still has the tunneling area to the left. Surface of the wound looks a lot better there has been some filling in the granulation I have been included on serial text between Dr. Callie Fielding  surgery] Dr. Ruffin Frederick oncology] and Dr. Para Skeans surgery]. I think there is not a lot of thought that the patient has recurrence of her cancer her MRI was indeterminant but [name] did not feel that there was any reason to think she has a major recurrence and he was not in favor of doing a dedicated breast MRI. Dr. Claudia Desanctis wants to go ahead with a flap closure I did it. I have not reviewed his note but per the patient this is a latissimus dorsi myocutaneous flap or I think a skin flap from her abdomen. I have talked to the patient I think it is time to attempt this. If I can continue hyperbarics surrounding the flap then we will attempt to do this 6/14; patient using Prisma as the primary dressing here. She has completed hyperbarics and did very well. Her wound is responded nicely. The depth of this and the condition of the granulation tissue looks a lot better. She has undermining areas at 2 and 10:00 both of these seem to be better as well. She is scheduled for flap closure on 7/11 by Dr. Claudia Desanctis 6/29; the patient continues to use Prisma wet-to-dry. Her graft is scheduled for 7/11 by  Dr. Claudia Desanctis. We are looking at getting her additional hyperbaric treatment which I think might help with the likelihood of graft survival in this difficult irradiated area 7/21; patient returns to clinic to reinitiate HBO after her surgical procedure by Dr. Claudia Desanctis. On 07/15/2020 she had debridement of left chest wound and a rotational flap closure. She also had excision of right excess axillary tissue. Pathology did not show any malignancy and follow-up Dr. Claudia Desanctis saw the patient yesterday. He noted a 2 cm area of wound dehiscence. Recommended she return for hyperbaric oxygen and she returns today for her first treatment. Follow-up with him in 2 weeks. We were not actually going to see the patient today her appointment to be evaluated was Monday however she was concerned about a "fishy odor". She is also noted some  drainage. Not specifically dressing in the wound area but covering with Adaptic 7/25; I am seeing this wound and follow-up. So far her culture is polymicrobial not growing any specific anything specific nevertheless with the Augmentin and the silver alginate her odor is a lot better. Not as much periwound tenderness. Dimensions measuring 2.7 x 3.3 x 1.6. She has undermining from 10-1 o'clock of about 1 and half centimeters. Necrotic fat on the surface of this... 08/13/2020 upon evaluation today patient appears to be doing decently well in regard to her wound all things considered. Obviously this is much more open than what was hoped for based on the fact that she had a flap performed which was supposed to completely close this wound. Nonetheless currently she tells me that she is not really having any significant pain fortunately the drainage is improved compared to last time there is still some evidence of odor as far as the drainage is concerned but is definitely not as purulent as what was previously noted. With that being said I do believe that there are quite a few sutures which are quite embedded. Nonetheless I believe this needs to be removed. In fact if they are not I am afraid this is again actually grow into the skin and that is can to be even bigger issue. Some of them even so at this point appeared that there could be difficult to even get out. 8/17; this is the first time I am seeing this in about 3 weeks. The patient had stop by my office yesterday to discuss after completing hyperbaric therapy. Based on what she said I was expecting the worst today however apparently with dressing changes and perhaps debridement last week the surface of the wound actually looks better than what I was expecting. There is considerable depth although the last time I saw this the base of the wound was necrotic there is still some necrotic tissue on the surface of the wound bed however in general the rest the  tissue looks healthy even with some bleeding. She has been using cleaning the wound with Dakin's and then applying Prisma and then backing Dakin's wet-to-dry. This seems to work to clean the wound surface up. They also ordered a wound VAC and I discussed this with her in depth today She is not systemically unwell. She is not running a fever there is no odor. She talks about pain over the lower part of the flap to touch. This does not appear to be related to movement of her shoulder. 8/23; the patient has been using the wound VAC for 1 week. Already a remarkable improvement and she appears to be tolerating this well we had Santyl under  the wound VAC however the surface looks so clean today I am doubtful that is going to be necessary She is also being treated with HBO. This is helped her dramatically. Apparently her current certification will be up on 8/31. She is only had 19 dives and we will certainly need to recertify her up for this. Since I saw her last week I put in a secure text message to Dr. Claudia Desanctis through epic to let him know that we were going ahead with a wound VAC. He did not have any objections to this. He is offering her more surgery however in the eventuality that this does not close 8/30; the patient missed 2 treatments of HBO last week because of nausea. I think she is now on Compazine which seems to have helped. Otherwise she is tolerating HBO well. We are collagen under wound VAC. Direct depth of wound measured at 1.2 cm which I think is probably unchanged however the undermining area is down to 0.5 which I think is an improvement 9/6; patient continues with chemotherapy although she says she is troll of her nausea. We are using collagen with a wound VAC on the remaining wound on her chest. No major improvements in the undermining area from 12-5 currently measuring 0.5 cm. This is no better than last week 9/13; patient continuing with chemotherapy. She completes HBO today. We are  using silver collagen under wound VAC. No major improvements however the undermining area seems somewhat better to me. 9/20; we are using silver collagen under the wound VAC and although about 60% of the wound looks healthy. At the base of the wound there is still a very adherent slough. She has been approved for TheraSkin which I would like to use if we can get the wound bed in a cleaner situation. 9/27; we are using Santyl under the wound VAC. Dimensions are slightly better including depth of 0.7 cm. Slight improvement in length and width the undermining area currently at 12-2 done in itself and improvement measures 0.6 which is not too much of improvement in terms of the probable depth 10/4; using Santyl under the wound VAC. Comes in today with a fairly dramatic improvement in surface area and marked improvement in the undermining area as well as condition of the wound bed. This is gratifying to see in view of this were going to continue the Santyl under the [facility] for another week. 10/11; patient presents for follow-up. She reports continued wound healing with Santyl and wound VAC. She has no issues or complaints today. She denies signs of infection. 10/17; although the wound appears to have contracted the measurements are not that much different and more problematically not much improvement in the depth and condition of the wound bed. She still has a shallow undermining area perhaps it 0.3 that its been somewhat improved. She is using using Santyl under the wound VAC in preparation for a trial of TheraSkin 10/27; wound looked quite good today. We went ahead with TheraSkin #1 today. The wound VAC can be discontinued 11/10; the TheraSkin was very adherent today. Very difficult to remove. Nevertheless the underlying tissue looked very healthy. Minimal undermining. TheraSkin #2 applied today 11/27/2020 upon evaluation today patient actually appears to be doing excellent in regard to her wound. We are  using TheraSkin currently which seems to be doing great. With this is actually application #3. Overall the wound is significantly smaller and I think we are headed in the right direction. 12/8; TheraSkin #4. Really dramatic  improvement I had not seen this in about a month. There is no depth 12/21; wound is too small for another TheraSkin 01/09/2021; the patient comes in with the same pinpoint spot and with what looks like surrounding slough. I think I gently debrided this last time however under intense illumination it looks as though the surface of this is all epithelialized although not particularly robust 1/19; the patient comes in for follow-up. The area looks fully epithelialized under direct light Electronic Signature(s) Signed: 01/23/2021 4:44:32 PM By: [name] MD Entered By: [name] on 01/23/2021 13:10:06 -------------------------------------------------------------------------------- Physical Exam Details Patient Name: Date of Service: [name], [name] 01/23/2021 12:30 PM Medical Record Number: [id number] Patient Account Number: [id number] Date of Birth/Sex: Treating RN: 05/06/1970 (51 y.o. [name] Primary Care Provider: [name] Other Clinician: Referring Provider: Treating Provider/Extender: [name] in Treatment: 41 Constitutional Sitting or standing Blood Pressure is within target range for patient.. Pulse regular and within target range for patient.Marland Kitchen Respirations regular, non-labored and within target range.. Temperature is normal and within the target range for the patient.Marland Kitchen Appears in no distress. Notes Wound exam; nothing open here. Her skin is dry in the area. This was a surgical area that also received radiation Electronic Signature(s) Signed: 01/23/2021 4:44:32 PM By: [name] MD Entered By: [name] on 01/23/2021  13:10:34 -------------------------------------------------------------------------------- Physician Orders Details Patient Name: Date of Service: [name], [name] 01/23/2021 12:30 PM Medical Record Number: [id number] Patient Account Number: [id number] Date of Birth/Sex: Treating RN: 1970/01/26 (51 y.o. 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 Electronic Signature(s) Signed: 01/23/2021 4:44:32 PM By: [name] MD Signed: 01/23/2021 5:47:38 PM By: Deon Pilling RN, BSN Entered By: Deon Pilling on 01/23/2021 12:57:56 -------------------------------------------------------------------------------- Problem List Details Patient Name: Date of Service: [name] V [name] 01/23/2021 12:30 PM Medical Record Number: [id number] Patient Account Number: [id number] Date of Birth/Sex: Treating RN: [birth year] (51 y.o. 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 01/23/2021 12:30 PM Medical Record Number: [id number] Patient Account Number: [id number] Date of Birth/Sex: Treating RN: 1970-12-06 (51 y.o. [name] Primary Care Provider: [name] Other Clinician: Referring Provider: Treating Provider/Extender: [name] in Treatment: 41 Subjective History of Present Illness (HPI) After canADMISSION  04/09/2020 This is a 51 year old woman who is history of breast cancer in the left breast goes back to 2018. He was found to have a mass in her left breast. She underwent a left back lumpectomy and then subsequent chemo that she did not tolerate very well. She had seed implants placed but then underwent external beam a radiation in the spring 2019. 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 Her adjunctive radiation was from 03/11/2017 through 04/21/2017. Unfortunately she had a left breast biopsy on 11/24/2019 and it was again positive for an invasive ductal carcinoma grade 3 he is to resume at receptor 80% positive. HER-2 negative progesterone negative. She opted for bilateral mastectomies which were done on 01/09/2020 on the right with no evidence of malignancy on the left stage IIb invasive ductal carcinoma. She again received chemotherapy but she did not tolerate it because of wound dehiscence and low  platelet counts according to the patient. Unfortunately her mastectomy wound dehisced on the left breast. She was taken back to the OR by [name] of general surgery on 33/24/22 and she underwent a debridement and subsequent attempt at closure. She had been previously treated with Augmentin for suspected underlying infection. Once again the wound has dehisced in the mid aspect. It is roughly 1 cm deep there is tunnels both medially and laterally she. She still has sutures in place. Still describes constant pain drainage. She also has a drain still in place that drains about 10 cc/day. Currently she is changing ABD pad several times a day 4/12; patient went back to see [name] who remove most of the stitches and debrided her wound. She has been using silver alginate culture I did last time which was a PCR culture showed Peptostreptococcus, Enterococcus faecalis and Staphylococcus epidermidis which is of questionable significance. She has a larger wound now that the sutures have been removed. There are undermining areas superior medial superior lateral and a deep tunneling area in the inferior part of the wound. There is no obvious purulence. I started her on Augmentin on 04/12/2020 and I will likely extend this for another week AMAZINGLY; she is already been approved for HBO and will start today. [name] Echocardiogram which I reviewed from 2019 showed a normal ejection fraction.. I went over side effects of HBO with the patient and answered any of her questions. Her husband was present 5/3; its been a while since have seen this wound. She went to see [name] about 2-1/2 weeks ago according to the patient he can remove some of the surrounding debris. She will see him again later this week or early next week. She has been using silver alginate. A lot of this is problematic since she has Medicaid which traditionally does not cover wound care supplies although it  traditionally does not cover HBO either Her wound actually looks quite a bit better than the last time I saw this better looking surface healthy looking tissue. She has tunnels at 810 2 in 6 the deepest of these is at 2 at 2.2 cm there is no exposed bone. 5/10; the patient was coming in for nurse visit today but requested that I see the wound because of surface discoloration. She states the Iodoflex makes the surface of this turned "white". She is not really complaining of undue tenderness. She is changing this every second day.  The goal of this had been to get the surface of the wound to a vibrant red color and then potentially look at changing to a silver collagen-based dressing, Puraply, and then perhaps an alternative skin substituteo Derma vest. The patient tells me she has an appoint with her oncologist [name] on the 19th. She apparently think she is going to get another PET scan. Based on this I would like his comment about whether there is possibility of any malignancy in the wound itself or just underneath it. Up to now we have not really thought that was true 5/17; nice improvement in the chest wound. Less depth. We have been using Iodoflex to clean off the wound surface this seems to have done nicely. Patient went to see [name] 2 days ago who has referred her to see Dr. Claudia Desanctis. I certainly agree with the thought the surface of the wound looks a lot better now and I think consideration of plastic surgery is certainly something the patient should discuss. I have talked to the patient about this today and I certainly support a conversation with Dr. Claudia Desanctis. I also wondered about is there any residual cancer in this. [name] apparently did not seem to think so per the patient although I do not see that in the text conversations we have had. I also wonder about a skin substitute if her version of Medicare [BCCP] would cover this. [name] is apparently considering starting her  on [place]. Patient is already very scared about the side effects but she will discuss this with [name] on 5/19 5/31; patient's wound about the same in terms of size she still has the tunneling area to the left. Surface of the wound looks a lot better there has been some filling in the granulation I have been included on serial text between Dr. Callie Fielding surgery] Dr. Ruffin Frederick oncology] and Dr. Para Skeans surgery]. I think there is not a lot of thought that the patient has recurrence of her cancer her MRI was indeterminant but [name] did not feel that there was any reason to think she has a major recurrence and he was not in favor of doing a dedicated breast MRI. Dr. Claudia Desanctis wants to go ahead with a flap closure I did it. I have not reviewed his note but per the patient this is a latissimus dorsi myocutaneous flap or I think a skin flap from her abdomen. I have talked to the patient I think it is time to attempt this. If I can continue hyperbarics surrounding the flap then we will attempt to do this 6/14; patient using Prisma as the primary dressing here. She has completed hyperbarics and did very well. Her wound is responded nicely. The depth of this and the condition of the granulation tissue looks a lot better. She has undermining areas at 2 and 10:00 both of these seem to be better as well. She is scheduled for flap closure on 7/11 by Dr. Claudia Desanctis 6/29; the patient continues to use Prisma wet-to-dry. Her graft is scheduled for 7/11 by Dr. Claudia Desanctis. We are looking at getting her additional hyperbaric treatment which I think might help with the likelihood of graft survival in this difficult irradiated area 7/21; patient returns to clinic to reinitiate HBO after her surgical procedure by Dr. Claudia Desanctis. On 07/15/2020 she had debridement of left chest wound and a rotational flap closure. She also had excision of right excess axillary tissue. Pathology did not show any malignancy and  follow-up Dr. Claudia Desanctis saw the patient yesterday. He noted a 2 cm area of wound dehiscence. Recommended she return for hyperbaric oxygen and she returns today for her first treatment. Follow-up with him in 2 weeks. We were not actually going to see the patient today her appointment to be evaluated was Monday however she was concerned about a "fishy odor". She is also noted some drainage. Not specifically dressing in the wound area but covering with Adaptic 7/25; I am seeing this wound and follow-up. So far her culture is polymicrobial not growing any specific anything specific nevertheless with the Augmentin and the silver alginate her odor is a lot better. Not as much periwound tenderness. Dimensions measuring 2.7 x 3.3 x 1.6. She has undermining from 10-1 o'clock of about 1 and half centimeters. Necrotic fat on the surface of this... 08/13/2020 upon evaluation today patient appears to be doing decently well in regard to her wound all things considered. Obviously this is much more open than what was hoped for based on the fact that she had a flap performed which was supposed to completely close this wound. Nonetheless currently she tells me that she is not really having any significant pain fortunately the drainage is improved compared to last time there is still some evidence of odor as far as the drainage is concerned but is definitely not as purulent as what was previously noted. With that being said I do believe that there are quite a few sutures which are quite embedded. Nonetheless I believe this needs to be removed. In fact if they are not I am afraid this is again actually grow into the skin and that is can to be even bigger issue. Some of them even so at this point appeared that there could be difficult to even get out. 8/17; this is the first time I am seeing this in about 3 weeks. The patient had stop by my office yesterday to discuss after completing hyperbaric therapy. Based on what she said I  was expecting the worst today however apparently with dressing changes and perhaps debridement last week the surface of the wound actually looks better than what I was expecting. There is considerable depth although the last time I saw this the base of the wound was necrotic there is still some necrotic tissue on the surface of the wound bed however in general the rest the tissue looks healthy even with some bleeding. She has been using cleaning the wound with Dakin's and then applying Prisma and then backing Dakin's wet-to-dry. This seems to work to clean the wound surface up. They also ordered a wound VAC and I discussed this with her in depth today She is not systemically unwell. She is not running a fever there is no odor. She talks about pain over the lower part of the flap to touch. This does not appear to be related to movement of her shoulder. 8/23; the patient has been using the wound VAC for 1 week. Already a remarkable improvement and she appears to be tolerating this well we had Santyl under the wound VAC however the surface looks so clean today I am doubtful that is going to be necessary She is also being treated with HBO. This is helped her dramatically. Apparently her current certification will be up on 8/31. She is only had 19 dives and we will certainly need to recertify her up for this. Since I saw her last week I put in a secure text message to Dr. Claudia Desanctis  through epic to let him know that we were going ahead with a wound VAC. He did not have any objections to this. He is offering her more surgery however in the eventuality that this does not close 8/30; the patient missed 2 treatments of HBO last week because of nausea. I think she is now on Compazine which seems to have helped. Otherwise she is tolerating HBO well. We are collagen under wound VAC. Direct depth of wound measured at 1.2 cm which I think is probably unchanged however the undermining area is down to 0.5 which I think is  an improvement 9/6; patient continues with chemotherapy although she says she is troll of her nausea. We are using collagen with a wound VAC on the remaining wound on her chest. No major improvements in the undermining area from 12-5 currently measuring 0.5 cm. This is no better than last week 9/13; patient continuing with chemotherapy. She completes HBO today. We are using silver collagen under wound VAC. No major improvements however the undermining area seems somewhat better to me. 9/20; we are using silver collagen under the wound VAC and although about 60% of the wound looks healthy. At the base of the wound there is still a very adherent slough. She has been approved for TheraSkin which I would like to use if we can get the wound bed in a cleaner situation. 9/27; we are using Santyl under the wound VAC. Dimensions are slightly better including depth of 0.7 cm. Slight improvement in length and width the undermining area currently at 12-2 done in itself and improvement measures 0.6 which is not too much of improvement in terms of the probable depth 10/4; using Santyl under the wound VAC. Comes in today with a fairly dramatic improvement in surface area and marked improvement in the undermining area as well as condition of the wound bed. This is gratifying to see in view of this were going to continue the Santyl under the [facility] for another week. 10/11; patient presents for follow-up. She reports continued wound healing with Santyl and wound VAC. She has no issues or complaints today. She denies signs of infection. 10/17; although the wound appears to have contracted the measurements are not that much different and more problematically not much improvement in the depth and condition of the wound bed. She still has a shallow undermining area perhaps it 0.3 that its been somewhat improved. She is using using Santyl under the wound VAC in preparation for a trial of TheraSkin 10/27; wound looked quite  good today. We went ahead with TheraSkin #1 today. The wound VAC can be discontinued 11/10; the TheraSkin was very adherent today. Very difficult to remove. Nevertheless the underlying tissue looked very healthy. Minimal undermining. TheraSkin #2 applied today 11/27/2020 upon evaluation today patient actually appears to be doing excellent in regard to her wound. We are using TheraSkin currently which seems to be doing great. With this is actually application #3. Overall the wound is significantly smaller and I think we are headed in the right direction. 12/8; TheraSkin #4. Really dramatic improvement I had not seen this in about a month. There is no depth 12/21; wound is too small for another TheraSkin 01/09/2021; the patient comes in with the same pinpoint spot and with what looks like surrounding slough. I think I gently debrided this last time however under intense illumination it looks as though the surface of this is all epithelialized although not particularly robust 1/19; the patient comes in for follow-up.  The area looks fully epithelialized under direct light Objective Constitutional Sitting or standing Blood Pressure is within target range for patient.. Pulse regular and within target range for patient.Marland Kitchen Respirations regular, non-labored and within target range.. Temperature is normal and within the target range for the patient.Marland Kitchen Appears in no distress. Vitals Time Taken: 12:52 PM, Height: 67 in, Weight: 257 lbs, BMI: 40.2, Temperature: 98.8 F, Pulse: 89 bpm, Respiratory Rate: 20 breaths/min, Blood Pressure: 105/74 mmHg. General Notes: Wound exam; nothing open here. Her skin is dry in the area. 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 1. The patient to be discharged from the wound care center 2. No restrictions to her activity 3 I advised her to keep this area moisturized the skin is heavily damaged but all of this looks healthy today Electronic Signature(s) Signed: 01/23/2021 4:44:32 PM By: [name] MD Entered By: [name] on 01/23/2021 13:11:16 -------------------------------------------------------------------------------- SuperBill Details Patient Name: Date of Service: [name] V [name] 01/23/2021 Medical Record Number: [id number] Patient Account Number: [id number] Date of Birth/Sex: Treating RN: December 06, 1970 (51 y.o. 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 Facility Procedures CPT4 Code: [id number] Description: [phone] - WOUND CARE VISIT-LEV 2 EST PT Modifier: Quantity: 1 Physician Procedures : CPT4 Code Description Modifier [id number] - WC PHYS LEVEL 2 - EST PT ICD-10 Diagnosis Description T81.31XD Disruption of external operation (surgical) wound, not elsewhere classified, subsequent encounter L59.8 Other specified disorders of the skin  and subcutaneous tissue related to radiation D05.12 Intraductal carcinoma in situ of left breast Quantity: 1 Electronic Signature(s) Signed: 01/23/2021 4:44:32 PM By: [name] MD Entered  By: [name] on 01/23/2021 13:11:32

## 2021-01-24 NOTE — Telephone Encounter (Signed)
Attempted to contact [name] regarding her upcoming surgery. Left message requesting return call.

## 2021-01-27 ENCOUNTER — Encounter: Payer: Medicaid Other | Admitting: Physical Therapy

## 2021-01-27 NOTE — Telephone Encounter (Signed)
Attempted to contact [name] regarding her upcoming surgery. Left message requesting return call.

## 2021-02-03 ENCOUNTER — Ambulatory Visit: Payer: Medicaid Other | Admitting: Physical Therapy

## 2021-02-04 ENCOUNTER — Other Ambulatory Visit (HOSPITAL_COMMUNITY): Payer: Self-pay

## 2021-02-05 NOTE — Progress Notes (Signed)
Sent message, via epic in basket, requesting orders in epic from surgeon.  

## 2021-02-06 ENCOUNTER — Telehealth: Payer: Self-pay

## 2021-02-06 ENCOUNTER — Other Ambulatory Visit: Payer: Self-pay | Admitting: Gynecologic Oncology

## 2021-02-06 DIAGNOSIS — Z1502 Genetic susceptibility to malignant neoplasm of ovary: Secondary | ICD-10-CM

## 2021-02-06 DIAGNOSIS — Z1509 Genetic susceptibility to other malignant neoplasm: Secondary | ICD-10-CM

## 2021-02-06 NOTE — Telephone Encounter (Signed)
Spoke with [name] this afternoon regarding her upcoming surgery. Pt has requested to leave her uterus during surgery. [name], NP made aware of pt decision.   Pre op appt with [name] is scheduled for 02/20/2021 at 12:00 pm. Pre admission testing appt is rescheduled for 02/20/2021 at 1:00 pm.

## 2021-02-07 ENCOUNTER — Encounter: Payer: Self-pay | Admitting: Oncology

## 2021-02-08 ENCOUNTER — Emergency Department (HOSPITAL_BASED_OUTPATIENT_CLINIC_OR_DEPARTMENT_OTHER): Payer: Medicaid Other

## 2021-02-08 ENCOUNTER — Encounter (HOSPITAL_BASED_OUTPATIENT_CLINIC_OR_DEPARTMENT_OTHER): Payer: Self-pay | Admitting: *Deleted

## 2021-02-08 ENCOUNTER — Other Ambulatory Visit: Payer: Self-pay

## 2021-02-08 ENCOUNTER — Emergency Department (HOSPITAL_BASED_OUTPATIENT_CLINIC_OR_DEPARTMENT_OTHER)
Admission: EM | Admit: 2021-02-08 | Discharge: 2021-02-09 | Disposition: A | Discharge status: Home / Self Care | Payer: Medicaid Other | Attending: Emergency Medicine | Admitting: Emergency Medicine

## 2021-02-08 DIAGNOSIS — I10 Essential (primary) hypertension: Secondary | ICD-10-CM | POA: Insufficient documentation

## 2021-02-08 DIAGNOSIS — U071 COVID-19: Secondary | ICD-10-CM | POA: Insufficient documentation

## 2021-02-08 DIAGNOSIS — R791 Abnormal coagulation profile: Secondary | ICD-10-CM | POA: Diagnosis not present

## 2021-02-08 DIAGNOSIS — R509 Fever, unspecified: Secondary | ICD-10-CM | POA: Diagnosis present

## 2021-02-08 DIAGNOSIS — R112 Nausea with vomiting, unspecified: Secondary | ICD-10-CM

## 2021-02-08 LAB — RESP PANEL BY RT-PCR (FLU A&B, COVID) ARPGX2
Influenza A by PCR: NEGATIVE
Influenza B by PCR: NEGATIVE
SARS Coronavirus 2 by RT PCR: POSITIVE — AB

## 2021-02-08 MED ORDER — SODIUM CHLORIDE 0.9 % IV BOLUS
1000.0000 mL | Freq: Once | INTRAVENOUS | Status: AC
  Administered 2021-02-09: 1000 mL via INTRAVENOUS

## 2021-02-08 NOTE — ED Provider Notes (Signed)
[facility] EMERGENCY DEPARTMENT Provider Note   CSN: [id number] Arrival date & time: 02/08/21  2212     History  Chief Complaint  Patient presents with   Fever    [name] is a 51 y.o. female. 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  Patient states that beginning on Wednesday she had fever up to 102. States that she thought her symptoms were improving, however now feels more "achy and not feelin good." States that she is "not keeping a whole lot down today." States that in the past 24 hours she has had x2 episodes of non-bloody diarrhea as well as nausea, scratchy throat and a dry cough. Took Motrin at 7:30p and tylenol at 8:45p. She denies chest pain, palpitations, shortness of breath, abdominal pain.    Fever Associated symptoms: cough, diarrhea, myalgias, nausea and sore throat   Associated symptoms: no chest pain and no dysuria       Home Medications Prior to Admission medications   Medication Sig Start Date End Date Taking? Authorizing Provider  docusate sodium (COLACE) 100 MG capsule Take 100 mg by mouth daily.    [provider]  gabapentin (NEURONTIN) 100 MG capsule TAKE 2 CAPSULES BY MOUTH IN THE MORNING AND 2 CAPSULES IN THE AFTERNOON Patient taking differently: as needed. PRN 09/30/20 09/30/21  Magrinat, [name], MD  gabapentin (NEURONTIN) 300 MG capsule Take 3 capsules (900 mg total) by mouth at bedtime. 09/30/20   Magrinat, [name], MD  olaparib (LYNPARZA) 150 MG tablet Take 2 tablets (300 mg total) by mouth 2 (two) times daily. Swallow whole. May take with food to decrease nausea and vomiting.Start May 23, 2020 10/30/20   Magrinat, [name], MD  ondansetron (ZOFRAN) 4 MG tablet Take 4 mg by mouth every 8 (eight) hours as needed for nausea or vomiting.    [provider]  potassium chloride  SA (KLOR-CON M) 20 MEQ tablet TAKE 1 TABLET BY MOUTH ONCE A DAY 12/09/20 12/09/21  Magrinat, [name], MD  prochlorperazine (COMPAZINE) 10 MG tablet Take 1 tablet (10 mg total) by mouth every 6 (six) hours as needed for nausea or vomiting. Patient not taking: Reported on 01/10/2021 12/02/20   Magrinat, [name], MD  traMADol (ULTRAM) 50 MG tablet TAKE 1 TO 2 TABLETS BY MOUTH EVERY 6 HOURS AS NEEDED. 12/02/20 05/31/21  Magrinat, [name], MD  venlafaxine XR (EFFEXOR-XR) 75 MG 24 hr capsule Take 1 capsule (75 mg total) by mouth daily with breakfast. Patient not taking: Reported on 03/25/2020 02/12/20 03/28/20  Magrinat, [name], MD      Allergies    Carboplatin    Review of Systems   Review of Systems  Constitutional:  Positive for appetite change, fatigue and fever.  HENT:  Positive for sore throat.   Respiratory:  Positive for cough. Negative for shortness of breath.   Cardiovascular:  Negative for chest pain, palpitations and leg swelling.  Gastrointestinal:  Positive for diarrhea and nausea. Negative for abdominal pain.  Genitourinary:  Negative for dysuria.  Musculoskeletal:  Positive for myalgias.   Physical Exam Updated Vital Signs BP 127/85 (BP Location: Right Arm)    Pulse 100    Temp 98.9 F (37.2 C) (Oral)    Resp 20    Ht 5' 7.5" (1.715 m)    Wt 114.8 kg    LMP  (LMP  Unknown)    SpO2 98%    BMI 39.04 kg/m  Physical Exam Vitals and nursing note reviewed.  Constitutional:      General: She is not in acute distress.    Appearance: Normal appearance. She is ill-appearing. She is not toxic-appearing.  HENT:     Head: Normocephalic and atraumatic.     Nose: Nose normal.     Mouth/Throat:     Mouth: Mucous membranes are moist.     Pharynx: Oropharynx is clear. Posterior oropharyngeal erythema present.  Eyes:     General: No scleral icterus.    Extraocular Movements: Extraocular movements intact.     Pupils: Pupils are equal, round, and reactive to light.  Cardiovascular:     Rate  and Rhythm: Normal rate and regular rhythm.     Pulses: Normal pulses.     Heart sounds: No murmur heard. Pulmonary:     Effort: Pulmonary effort is normal. No respiratory distress.     Breath sounds: Normal breath sounds. No wheezing or rhonchi.  Abdominal:     General: Bowel sounds are normal. There is no distension.     Palpations: Abdomen is soft.  Musculoskeletal:        General: Normal range of motion.     Cervical back: Normal range of motion and neck supple.  Skin:    General: Skin is warm and dry.     Capillary Refill: Capillary refill takes less than 2 seconds.     Findings: No rash.  Neurological:     General: No focal deficit present.     Mental Status: She is alert and oriented to person, place, and time. Mental status is at baseline.  Psychiatric:        Mood and Affect: Mood normal.        Behavior: Behavior normal.        Thought Content: Thought content normal.        Judgment: Judgment normal.    ED Results / Procedures / Treatments   Labs (all labs ordered are listed, but only abnormal results are displayed) Labs Reviewed  RESP PANEL BY RT-PCR (FLU A&B, COVID) ARPGX2 - Abnormal; Notable for the following components:      Result Value   SARS Coronavirus 2 by RT PCR POSITIVE (*)    All other components within normal limits  COMPREHENSIVE METABOLIC PANEL - Abnormal; Notable for the following components:   Potassium 3.3 (*)    Glucose, Bld 104 (*)    Calcium 8.8 (*)    AST 53 (*)    ALT 60 (*)    All other components within normal limits  APTT - Abnormal; Notable for the following components:   aPTT 20 (*)    All other components within normal limits  URINALYSIS, ROUTINE W REFLEX MICROSCOPIC - Abnormal; Notable for the following components:   Hgb urine dipstick TRACE (*)    All other components within normal limits  CBC WITH DIFFERENTIAL/PLATELET - Abnormal; Notable for the following components:   WBC 2.9 (*)    Platelets 140 (*)    Neutro Abs 1.2 (*)     All other components within normal limits  URINALYSIS, MICROSCOPIC (REFLEX) - Abnormal; Notable for the following components:   Bacteria, UA FEW (*)    All other components within normal limits  CULTURE, BLOOD (ROUTINE X 2)  CULTURE, BLOOD (ROUTINE X 2)  URINE CULTURE  LACTIC ACID, PLASMA  PROTIME-INR  CBC WITH DIFFERENTIAL/PLATELET   EKG EKG  Interpretation  Date/Time:  Sunday February 09 2021 00:48:51 EST Ventricular Rate:  82 PR Interval:    QRS Duration: 94 QT Interval:  368 QTC Calculation: 430 R Axis:   -7 Text Interpretation: Normal sinus rhythm Normal ECG No significant change was found Confirmed by Molpus, John ([id number]) on 02/09/2021 1:06:39 AM  Radiology DG Chest Port 1 View  Result Date: 02/09/2021 CLINICAL DATA:  Sepsis.  COVID positive with symptoms for 4 days. EXAM: PORTABLE CHEST 1 VIEW COMPARISON:  04/15/2020 FINDINGS: The heart size and mediastinal contours are within normal limits. Both lungs are clear. The visualized skeletal structures are unremarkable. Power port type central venous catheter with tip over the low SVC region. No pneumothorax. IMPRESSION: No active disease. Electronically Signed   By: William  Stevens M.D.   On: 02/09/2021 01:29    Procedures Procedures   Medications Ordered in ED Medications  sodium chloride 0.9 % bolus 1,000 mL (0 mLs Intravenous Stopped 02/09/21 0407)  potassium chloride SA (KLOR-CON M) CR tablet 40 mEq (40 mEq Oral Given 02/09/21 0202)  ondansetron (ZOFRAN) injection 4 mg (4 mg Intravenous Given 02/09/21 0200)   ED Course/ Medical Decision Making/ A&P                           Medical Decision Making Amount and/or Complexity of Data Reviewed Labs: ordered. Radiology: ordered. ECG/medicine tests: ordered.  Risk Prescription drug management.  50 year old female who presents to the emergency department with sore throat, fever. Patient is currently on oral chemotherapy, so will initiate neutropenic fever work-up.    Patient COVID-19 positive. At this time handing off care of patient to Dr. Molpus. She will need completed sepsis workup. If otherwise stable, reasonable to send home with anti-viral medication, symptomatic medication. If work-up reveals worsening picture, reasonable to admit. Disposition and complete diagnosis pending rest of workup. Please refer to Dr. Molpus note for continued care.  Final Clinical Impression(s) / ED Diagnoses Final diagnoses:  COVID-19 virus infection  Nausea, vomiting and diarrhea    Rx / DC Orders ED Discharge Orders          Ordered    ondansetron (ZOFRAN-ODT) 8 MG disintegrating tablet  Every 8 hours PRN,   Status:  Discontinued        02/09/21 0323    molnupiravir EUA (LAGEVRIO) 200 mg CAPS capsule  2 times daily,   Status:  Discontinued        02/09/21 0323    molnupiravir EUA (LAGEVRIO) 200 mg CAPS capsule  2 times daily        02/09/21 0339    ondansetron (ZOFRAN-ODT) 8 MG disintegrating tablet  Every 8 hours PRN        02/09/21 0339    chlorpheniramine-HYDROcodone (TUSSIONEX PENNKINETIC ER) 10-8 MG/5ML  Every 12 hours PRN,   Status:  Discontinued        02/09/21 0339    chlorpheniramine-HYDROcodone (TUSSIONEX PENNKINETIC ER) 10-8 MG/5ML  Every 12 hours PRN        02 /05/23 0424              Mickie Hillier, PA-C 02/09/21 1609    Long, [facility], MD 02/18/21 [phone]

## 2021-02-08 NOTE — ED Triage Notes (Signed)
Pt reports fever 102+ for several days with n/v/d. Reports scratchy throat and swollen lymph nodes in neck. She is in treatment for breast CA. She took ibuprofen around 1915 and tylenol around 2045 tonight

## 2021-02-09 LAB — CBC WITH DIFFERENTIAL/PLATELET
Abs Immature Granulocytes: 0.01 10*3/uL (ref 0.00–0.07)
Basophils Absolute: 0 10*3/uL (ref 0.0–0.1)
Basophils Relative: 0 %
Eosinophils Absolute: 0.1 10*3/uL (ref 0.0–0.5)
Eosinophils Relative: 3 %
HCT: 36.8 % (ref 36.0–46.0)
Hemoglobin: 12.4 g/dL (ref 12.0–15.0)
Immature Granulocytes: 0 %
Lymphocytes Relative: 49 %
Lymphs Abs: 1.4 10*3/uL (ref 0.7–4.0)
MCH: 30.3 pg (ref 26.0–34.0)
MCHC: 33.7 g/dL (ref 30.0–36.0)
MCV: 90 fL (ref 80.0–100.0)
Monocytes Absolute: 0.2 10*3/uL (ref 0.1–1.0)
Monocytes Relative: 5 %
Neutro Abs: 1.2 10*3/uL — ABNORMAL LOW (ref 1.7–7.7)
Neutrophils Relative %: 43 %
Platelets: 140 10*3/uL — ABNORMAL LOW (ref 150–400)
RBC: 4.09 MIL/uL (ref 3.87–5.11)
RDW: 13.7 % (ref 11.5–15.5)
WBC: 2.9 10*3/uL — ABNORMAL LOW (ref 4.0–10.5)
nRBC: 0 % (ref 0.0–0.2)

## 2021-02-09 LAB — APTT: aPTT: 20 seconds — ABNORMAL LOW (ref 24–36)

## 2021-02-09 LAB — COMPREHENSIVE METABOLIC PANEL
ALT: 60 U/L — ABNORMAL HIGH (ref 0–44)
AST: 53 U/L — ABNORMAL HIGH (ref 15–41)
Albumin: 3.9 g/dL (ref 3.5–5.0)
Alkaline Phosphatase: 99 U/L (ref 38–126)
Anion gap: 10 (ref 5–15)
BUN: 7 mg/dL (ref 6–20)
CO2: 26 mmol/L (ref 22–32)
Calcium: 8.8 mg/dL — ABNORMAL LOW (ref 8.9–10.3)
Chloride: 100 mmol/L (ref 98–111)
Creatinine, Ser: 0.72 mg/dL (ref 0.44–1.00)
GFR, Estimated: >60 mL/min (ref >60)
Glucose, Bld: 104 mg/dL — ABNORMAL HIGH (ref 70–99)
Potassium: 3.3 mmol/L — ABNORMAL LOW (ref 3.5–5.1)
Sodium: 136 mmol/L (ref 135–145)
Total Bilirubin: 0.5 mg/dL (ref 0.3–1.2)
Total Protein: 7.7 g/dL (ref 6.5–8.1)

## 2021-02-09 LAB — URINALYSIS, MICROSCOPIC (REFLEX)

## 2021-02-09 LAB — PROTIME-INR
INR: 0.9 (ref 0.8–1.2)
Prothrombin Time: 11.9 seconds (ref 11.4–15.2)

## 2021-02-09 LAB — URINALYSIS, ROUTINE W REFLEX MICROSCOPIC
Bilirubin Urine: NEGATIVE
Glucose, UA: NEGATIVE mg/dL
Ketones, ur: NEGATIVE mg/dL
Leukocytes,Ua: NEGATIVE
Nitrite: NEGATIVE
Protein, ur: NEGATIVE mg/dL
Specific Gravity, Urine: >=1.03 (ref 1.005–1.030)
pH: 5.5 (ref 5.0–8.0)

## 2021-02-09 LAB — LACTIC ACID, PLASMA: Lactic Acid, Venous: 1.1 mmol/L (ref 0.5–1.9)

## 2021-02-09 MED ORDER — ONDANSETRON 8 MG PO TBDP: 8.0000 mg | ORAL_TABLET | Freq: Three times a day (TID) | ORAL | 0 refills | Status: DC | PRN

## 2021-02-09 MED ORDER — HYDROCOD POLI-CHLORPHE POLI ER 10-8 MG/5ML PO SUER: 5.0000 mL | Freq: Two times a day (BID) | ORAL | 0 refills | Status: DC | PRN

## 2021-02-09 MED ORDER — MOLNUPIRAVIR EUA 200MG CAPSULE: 4.0000 | ORAL_CAPSULE | Freq: Two times a day (BID) | ORAL | 0 refills | Status: AC

## 2021-02-09 MED ORDER — POTASSIUM CHLORIDE CRYS ER 20 MEQ PO TBCR
40.0000 meq | EXTENDED_RELEASE_TABLET | Freq: Once | ORAL | Status: AC
  Administered 2021-02-09: 40 meq via ORAL
  Filled 2021-02-09: qty 2

## 2021-02-09 MED ORDER — MOLNUPIRAVIR EUA 200MG CAPSULE: 4.0000 | ORAL_CAPSULE | Freq: Two times a day (BID) | ORAL | 0 refills | Status: DC

## 2021-02-09 MED ORDER — ONDANSETRON HCL 4 MG/2ML IJ SOLN
4.0000 mg | Freq: Once | INTRAMUSCULAR | Status: AC
  Administered 2021-02-09: 4 mg via INTRAVENOUS
  Filled 2021-02-09: qty 2

## 2021-02-09 NOTE — Progress Notes (Signed)
RT unable to obtain IV access. 

## 2021-02-09 NOTE — ED Provider Notes (Signed)
Nursing notes and vitals signs, including pulse oximetry, reviewed.  Summary of this visit's results, reviewed by myself:  EKG:  EKG Interpretation  Date/Time:  Sunday February 09 2021 00:48:51 EST Ventricular Rate:  82 PR Interval:    QRS Duration: 94 QT Interval:  368 QTC Calculation: 430 R Axis:   -7 Text Interpretation: Normal sinus rhythm Normal ECG No significant change was found Confirmed by Shanon Rosser [phone]) on 02/09/2021 1:06:39 AM        Labs:  Results for orders placed or performed during the hospital encounter of 02/08/21 (from the past 24 hour(s))  Resp Panel by RT-PCR (Flu A&B, Covid) Nasopharyngeal Swab     Status: Abnormal   Collection Time: 02/08/21 10:30 PM   Specimen: Nasopharyngeal Swab; Nasopharyngeal(NP) swabs in vial transport medium  Result Value Ref Range   SARS Coronavirus 2 by RT PCR POSITIVE (A) NEGATIVE   Influenza A by PCR NEGATIVE NEGATIVE   Influenza B by PCR NEGATIVE NEGATIVE  Lactic acid, plasma     Status: None   Collection Time: 02/08/21 11:25 PM  Result Value Ref Range   Lactic Acid, Venous 1.1 0.5 - 1.9 mmol/L  Comprehensive metabolic panel     Status: Abnormal   Collection Time: 02/08/21 11:25 PM  Result Value Ref Range   Sodium 136 135 - 145 mmol/L   Potassium 3.3 (L) 3.5 - 5.1 mmol/L   Chloride 100 98 - 111 mmol/L   CO2 26 22 - 32 mmol/L   Glucose, Bld 104 (H) 70 - 99 mg/dL   BUN 7 6 - 20 mg/dL   Creatinine, Ser 0.72 0.44 - 1.00 mg/dL   Calcium 8.8 (L) 8.9 - 10.3 mg/dL   Total Protein 7.7 6.5 - 8.1 g/dL   Albumin 3.9 3.5 - 5.0 g/dL   AST 53 (H) 15 - 41 U/L   ALT 60 (H) 0 - 44 U/L   Alkaline Phosphatase 99 38 - 126 U/L   Total Bilirubin 0.5 0.3 - 1.2 mg/dL   GFR, Estimated >60 >60 mL/min   Anion gap 10 5 - 15  Protime-INR     Status: None   Collection Time: 02/08/21 11:25 PM  Result Value Ref Range   Prothrombin Time 11.9 11.4 - 15.2 seconds   INR 0.9 0.8 - 1.2  APTT     Status: Abnormal   Collection Time: 02/08/21  11:25 PM  Result Value Ref Range   aPTT 20 (L) 24 - 36 seconds  CBC with Differential/Platelet     Status: Abnormal   Collection Time: 02/09/21 12:50 AM  Result Value Ref Range   WBC 2.9 (L) 4.0 - 10.5 K/uL   RBC 4.09 3.87 - 5.11 MIL/uL   Hemoglobin 12.4 12.0 - 15.0 g/dL   HCT 36.8 36.0 - 46.0 %   MCV 90.0 80.0 - 100.0 fL   MCH 30.3 26.0 - 34.0 pg   MCHC 33.7 30.0 - 36.0 g/dL   RDW 13.7 11.5 - 15.5 %   Platelets 140 (L) 150 - 400 K/uL   nRBC 0.0 0.0 - 0.2 %   Neutrophils Relative % 43 %   Neutro Abs 1.2 (L) 1.7 - 7.7 K/uL   Lymphocytes Relative 49 %   Lymphs Abs 1.4 0.7 - 4.0 K/uL   Monocytes Relative 5 %   Monocytes Absolute 0.2 0.1 - 1.0 K/uL   Eosinophils Relative 3 %   Eosinophils Absolute 0.1 0.0 - 0.5 K/uL   Basophils Relative 0 %  Basophils Absolute 0.0 0.0 - 0.1 K/uL   Immature Granulocytes 0 %   Abs Immature Granulocytes 0.01 0.00 - 0.07 K/uL  Urinalysis, Routine w reflex microscopic Urine, Clean Catch     Status: Abnormal   Collection Time: 02/09/21  3:01 AM  Result Value Ref Range   Color, Urine YELLOW YELLOW   APPearance CLEAR CLEAR   Specific Gravity, Urine >=1.030 1.005 - 1.030   pH 5.5 5.0 - 8.0   Glucose, UA NEGATIVE NEGATIVE mg/dL   Hgb urine dipstick TRACE (A) NEGATIVE   Bilirubin Urine NEGATIVE NEGATIVE   Ketones, ur NEGATIVE NEGATIVE mg/dL   Protein, ur NEGATIVE NEGATIVE mg/dL   Nitrite NEGATIVE NEGATIVE   Leukocytes,Ua NEGATIVE NEGATIVE  Urinalysis, Microscopic (reflex)     Status: Abnormal   Collection Time: 02/09/21  3:01 AM  Result Value Ref Range   RBC / HPF 0-5 0 - 5 RBC/hpf   WBC, UA 0-5 0 - 5 WBC/hpf   Bacteria, UA FEW (A) NONE SEEN   Squamous Epithelial / LPF 0-5 0 - 5   Mucus PRESENT     Imaging Studies: DG Chest Port 1 View  Result Date: 02/09/2021 CLINICAL DATA:  Sepsis.  COVID positive with symptoms for 4 days. EXAM: PORTABLE CHEST 1 VIEW COMPARISON:  04/15/2020 FINDINGS: The heart size and mediastinal contours are within normal  limits. Both lungs are clear. The visualized skeletal structures are unremarkable. Power port type central venous catheter with tip over the low SVC region. No pneumothorax. IMPRESSION: No active disease. Electronically Signed   By: Lucienne Capers M.D.   On: 02/09/2021 01:29    2:40 AM Patient is positive for COVID and although she has a low white count she is not neutropenic.  Blood cultures were ordered as a precaution.  Her lactate is normal.  She is receiving an IV fluid bolus.  She was given potassium repletion due to slightly low potassium likely from GI losses.  We will start her on molnupiravir for her COVID given that she has risk factors for severe disease.    Jaila Schellhorn, Jenny Reichmann, MD 02/09/21 [phone]

## 2021-02-09 NOTE — ED Notes (Signed)
Imaging attempted x 3, staff at bedside drawing labs.

## 2021-02-10 ENCOUNTER — Other Ambulatory Visit (HOSPITAL_COMMUNITY): Payer: Self-pay

## 2021-02-10 LAB — URINE CULTURE

## 2021-02-13 ENCOUNTER — Encounter (HOSPITAL_COMMUNITY): Payer: Medicaid Other

## 2021-02-14 ENCOUNTER — Telehealth: Payer: Self-pay | Admitting: *Deleted

## 2021-02-14 LAB — CULTURE, BLOOD (ROUTINE X 2)
Culture: NO GROWTH
Culture: NO GROWTH
Special Requests: ADEQUATE

## 2021-02-14 NOTE — Telephone Encounter (Signed)
RN attempt x1 to contact pt regarding recent Covid  diagnosis while on [place]  No answer, LVM for pt to return call to the office.

## 2021-02-14 NOTE — Telephone Encounter (Signed)
error 

## 2021-02-18 ENCOUNTER — Telehealth: Payer: Self-pay

## 2021-02-18 NOTE — Telephone Encounter (Signed)
Called patient back and got her Pre-op appointment changed to Tuesday, February 21st @ 9 am with Lenna Sciara, NP.

## 2021-02-18 NOTE — Telephone Encounter (Signed)
Spoke with the patient and gave new appt for pre admission on 2/21 at 8 am

## 2021-02-18 NOTE — Telephone Encounter (Signed)
Patient called to change Pre-op appointment with Lenna Sciara, NP. Informed patient we will call her back with new time and date.

## 2021-02-19 ENCOUNTER — Other Ambulatory Visit (HOSPITAL_COMMUNITY): Payer: Medicaid Other

## 2021-02-19 NOTE — Progress Notes (Addendum)
COVID swab appointment: n/a COVID + 3 weeks  Did not sign hernia consent form because it does not include port removal. Will sign DOS  COVID Vaccine Completed: no Date COVID Vaccine completed: Has received booster: COVID vaccine manufacturer: [employer]   Date of COVID positive in last 90 days: 3 weeks ago at med center in [place]  PCP - Bing Quarry Cardiologist -   Chest x-ray - 02/08/21 Epic EKG - 02/09/21 Epic Stress Test - 03/05/05 Epic ECHO - 06/01/17 Epic Cardiac Cath - n/a Pacemaker/ICD device last checked: n/a Spinal Cord Stimulator: n/a  Bowel Prep - light diet day before  Sleep Study - n/a CPAP -   Fasting Blood Sugar - n/a Checks Blood Sugar _____ times a day  Blood Thinner Instructions: n/a Aspirin Instructions: Last Dose:  Activity level: Can go up a flight of stairs and perform activities of daily living without stopping and without symptoms of chest pain or shortness of breath.     Anesthesia review:   Patient denies shortness of breath, fever, cough and chest pain at PAT appointment   Patient verbalized understanding of instructions that were given to them at the PAT appointment. Patient was also instructed that they will need to review over the PAT instructions again at home before surgery.

## 2021-02-20 ENCOUNTER — Encounter: Payer: Medicaid Other | Admitting: Gynecologic Oncology

## 2021-02-20 ENCOUNTER — Encounter (HOSPITAL_COMMUNITY): Payer: Medicaid Other

## 2021-02-20 NOTE — Patient Instructions (Addendum)
DUE TO COVID-19 ONLY ONE VISITOR IS ALLOWED TO COME WITH YOU AND STAY IN THE WAITING ROOM ONLY DURING PRE OP AND PROCEDURE.   **NO VISITORS ARE ALLOWED IN THE SHORT STAY AREA OR RECOVERY ROOM!!**  IF YOU WILL BE ADMITTED INTO THE HOSPITAL YOU ARE ALLOWED ONLY TWO SUPPORT PEOPLE DURING VISITATION HOURS ONLY (7 AM -8PM)   The support person(s) must pass our screening, gel in and out, and wear a mask at all times, including in the patients room. Patients must also wear a mask when staff or their support person are in the room. Visitors GUEST BADGE MUST BE WORN VISIBLY  One adult visitor may remain with you overnight and MUST be in the room by 8 P.M.  No visitors under the age of 11. Any visitor under the age of 32 must be accompanied by an adult.        Your procedure is scheduled on: 02/26/21   Report to [facility] Main Entrance    Report to admitting at 5:15 AM   Call this number if you have problems the morning of surgery [phone]   Do not eat food :After Midnight.   May have liquids until 4: AM day of surgery  CLEAR LIQUID DIET  Foods Allowed                                                                     Foods Excluded  Water, Black Coffee and tea, regular and decaf                             liquids that you cannot  Plain Jell-O in any flavor  (No red)                                           see through such as: Fruit ices (not with fruit pulp)                                     milk, soups, orange juice              Iced Popsicles (No red)                                    All solid food                                   Apple juices Sports drinks like Gatorade (No red) Lightly seasoned clear broth or consume(fat free) Sugar    The day of surgery:  Drink ONE (1) Pre-Surgery Clear Ensure at 5:30 AM the morning of surgery. Drink in one sitting. Do not sip.  This drink was given to you during your hospital  pre-op appointment visit. Nothing else to  drink after completing the  Pre-Surgery Clear Ensure.          If  you have questions, please contact your surgeons office.  FOLLOW BOWEL PREP AND ANY ADDITIONAL PRE OP INSTRUCTIONS YOU RECEIVED FROM YOUR SURGEON'S OFFICE!!!     Oral Hygiene is also important to reduce your risk of infection.                                    Remember - BRUSH YOUR TEETH THE MORNING OF SURGERY WITH YOUR REGULAR TOOTHPASTE   Take these medicines the morning of surgery with A SIP OF WATER: Gabapentin, Tramadol                               You may not have any metal on your body including hair pins, jewelry, and body piercing             Do not wear make-up, lotions, powders, perfumes, or deodorant  Do not wear nail polish including gel and S&S, artificial/acrylic nails, or any other type of covering on natural nails including finger and toenails. If you have artificial nails, gel coating, etc. that needs to be removed by a nail salon please have this removed prior to surgery or surgery may need to be canceled/ delayed if the surgeon/ anesthesia feels like they are unable to be safely monitored.   Do not shave  48 hours prior to surgery.    Do not bring valuables to the hospital. [place].   Contacts, dentures or bridgework may not be worn into surgery.   Bring small overnight bag day of surgery.              Please read over the following fact sheets you were given: IF YOU HAVE QUESTIONS ABOUT YOUR PRE-OP INSTRUCTIONS PLEASE CALL [facility] - Preparing for Surgery Before surgery, you can play an important role.  Because skin is not sterile, your skin needs to be as free of germs as possible.  You can reduce the number of germs on your skin by washing with CHG (chlorahexidine gluconate) soap before surgery.  CHG is an antiseptic cleaner which kills germs and bonds with the skin to continue killing germs even after  washing. Please DO NOT use if you have an allergy to CHG or antibacterial soaps.  If your skin becomes reddened/irritated stop using the CHG and inform your nurse when you arrive at Short Stay. Do not shave (including legs and underarms) for at least 48 hours prior to the first CHG shower.  You may shave your face/neck.  Please follow these instructions carefully:  1.  Shower with CHG Soap the night before surgery and the  morning of surgery.  2.  If you choose to wash your hair, wash your hair first as usual with your normal  shampoo.  3.  After you shampoo, rinse your hair and body thoroughly to remove the shampoo.                             4.  Use CHG as you would any other liquid soap.  You can apply chg directly to the skin and wash.  Gently with a scrungie or clean washcloth.  5.  Apply the CHG Soap to your  body ONLY FROM THE NECK DOWN.   Do   not use on face/ open                           Wound or open sores. Avoid contact with eyes, ears mouth and   genitals (private parts).                       Wash face,  Genitals (private parts) with your normal soap.             6.  Wash thoroughly, paying special attention to the area where your    surgery  will be performed.  7.  Thoroughly rinse your body with warm water from the neck down.  8.  DO NOT shower/wash with your normal soap after using and rinsing off the CHG Soap.                9.  Pat yourself dry with a clean towel.            10.  Wear clean pajamas.            11.  Place clean sheets on your bed the night of your first shower and do not  sleep with pets. Day of Surgery : Do not apply any lotions/deodorants the morning of surgery.  Please wear clean clothes to the hospital/surgery center.  FAILURE TO FOLLOW THESE INSTRUCTIONS MAY RESULT IN THE CANCELLATION OF YOUR SURGERY  PATIENT SIGNATURE_________________________________  NURSE  SIGNATURE__________________________________  ________________________________________________________________________  WHAT IS A BLOOD TRANSFUSION? Blood Transfusion Information  A transfusion is the replacement of blood or some of its parts. Blood is made up of multiple cells which provide different functions. Red blood cells carry oxygen and are used for blood loss replacement. White blood cells fight against infection. Platelets control bleeding. Plasma helps clot blood. Other blood products are available for specialized needs, such as hemophilia or other clotting disorders. BEFORE THE TRANSFUSION  Who gives blood for transfusions?  Healthy volunteers who are fully evaluated to make sure their blood is safe. This is blood bank blood. Transfusion therapy is the safest it has ever been in the practice of medicine. Before blood is taken from a donor, a complete history is taken to make sure that person has no history of diseases nor engages in risky social behavior (examples are intravenous drug use or sexual activity with multiple partners). The donor's travel history is screened to minimize risk of transmitting infections, such as malaria. The donated blood is tested for signs of infectious diseases, such as HIV and hepatitis. The blood is then tested to be sure it is compatible with you in order to minimize the chance of a transfusion reaction. If you or a relative donates blood, this is often done in anticipation of surgery and is not appropriate for emergency situations. It takes many days to process the donated blood. RISKS AND COMPLICATIONS Although transfusion therapy is very safe and saves many lives, the main dangers of transfusion include:  Getting an infectious disease. Developing a transfusion reaction. This is an allergic reaction to something in the blood you were given. Every precaution is taken to prevent this. The decision to have a blood transfusion has been considered carefully  by your caregiver before blood is given. Blood is not given unless the benefits outweigh the risks. AFTER THE TRANSFUSION Right after receiving a blood transfusion, you will usually feel much  better and more energetic. This is especially true if your red blood cells have gotten low (anemic). The transfusion raises the level of the red blood cells which carry oxygen, and this usually causes an energy increase. The nurse administering the transfusion will monitor you carefully for complications. HOME CARE INSTRUCTIONS  No special instructions are needed after a transfusion. You may find your energy is better. Speak with your caregiver about any limitations on activity for underlying diseases you may have. SEEK MEDICAL CARE IF:  Your condition is not improving after your transfusion. You develop redness or irritation at the intravenous (IV) site. SEEK IMMEDIATE MEDICAL CARE IF:  Any of the following symptoms occur over the next 12 hours: Shaking chills. You have a temperature by mouth above 102 F (38.9 C), not controlled by medicine. Chest, back, or muscle pain. People around you feel you are not acting correctly or are confused. Shortness of breath or difficulty breathing. Dizziness and fainting. You get a rash or develop hives. You have a decrease in urine output. Your urine turns a dark color or changes to pink, red, or brown. Any of the following symptoms occur over the next 10 days: You have a temperature by mouth above 102 F (38.9 C), not controlled by medicine. Shortness of breath. Weakness after normal activity. The white part of the eye turns yellow (jaundice). You have a decrease in the amount of urine or are urinating less often. Your urine turns a dark color or changes to pink, red, or brown. Document Released: 12/20/1999 Document Revised: 03/16/2011 Document Reviewed: 08/08/2007 [facility] Patient Information 2014 [place],  [place].  _______________________________________________________________________

## 2021-02-24 ENCOUNTER — Telehealth: Payer: Self-pay

## 2021-02-24 ENCOUNTER — Encounter: Payer: Self-pay | Admitting: Gynecologic Oncology

## 2021-02-24 NOTE — Telephone Encounter (Signed)
Following up with [name] this afternoon. Patient is overall doing well. RN inquired if she is still taking anastrazole and lynparza. Patient reports she has not taken anastrazole for over a month now but she is still taking lynparza, last dose was this morning. Dr. Berline Lopes notified.  Advised patient to hold the lynparaza and we will check her labs when she comes in for her pre-op visit tomorrow. Patient verbalized understanding.

## 2021-02-25 ENCOUNTER — Other Ambulatory Visit: Payer: Self-pay | Admitting: Internal Medicine

## 2021-02-25 ENCOUNTER — Encounter (HOSPITAL_COMMUNITY)
Admission: RE | Admit: 2021-02-25 | Discharge: 2021-02-25 | Disposition: A | Discharge status: Home / Self Care | Payer: Medicaid Other | Source: Ambulatory Visit | Attending: Gynecologic Oncology | Admitting: Gynecologic Oncology

## 2021-02-25 ENCOUNTER — Other Ambulatory Visit: Payer: Self-pay | Admitting: General Surgery

## 2021-02-25 ENCOUNTER — Telehealth: Payer: Self-pay

## 2021-02-25 ENCOUNTER — Other Ambulatory Visit: Payer: Self-pay

## 2021-02-25 ENCOUNTER — Inpatient Hospital Stay: Payer: Medicaid Other | Attending: Oncology | Admitting: Gynecologic Oncology

## 2021-02-25 ENCOUNTER — Other Ambulatory Visit (HOSPITAL_COMMUNITY): Payer: Self-pay

## 2021-02-25 ENCOUNTER — Encounter (HOSPITAL_COMMUNITY): Payer: Self-pay

## 2021-02-25 VITALS — BP 133/74 | HR 75 | Temp 98.1°F | Resp 16 | Ht 67.5 in | Wt 249.6 lb

## 2021-02-25 DIAGNOSIS — C50912 Malignant neoplasm of unspecified site of left female breast: Secondary | ICD-10-CM | POA: Diagnosis not present

## 2021-02-25 DIAGNOSIS — Z1509 Genetic susceptibility to other malignant neoplasm: Secondary | ICD-10-CM

## 2021-02-25 DIAGNOSIS — Z1502 Genetic susceptibility to malignant neoplasm of ovary: Secondary | ICD-10-CM

## 2021-02-25 DIAGNOSIS — Z1501 Genetic susceptibility to malignant neoplasm of breast: Secondary | ICD-10-CM | POA: Diagnosis not present

## 2021-02-25 DIAGNOSIS — Z01812 Encounter for preprocedural laboratory examination: Secondary | ICD-10-CM | POA: Insufficient documentation

## 2021-02-25 DIAGNOSIS — K429 Umbilical hernia without obstruction or gangrene: Secondary | ICD-10-CM

## 2021-02-25 LAB — COMPREHENSIVE METABOLIC PANEL
ALT: 19 U/L (ref 0–44)
AST: 21 U/L (ref 15–41)
Albumin: 4 g/dL (ref 3.5–5.0)
Alkaline Phosphatase: 82 U/L (ref 38–126)
Anion gap: 6 (ref 5–15)
BUN: 8 mg/dL (ref 6–20)
CO2: 27 mmol/L (ref 22–32)
Calcium: 9.1 mg/dL (ref 8.9–10.3)
Chloride: 104 mmol/L (ref 98–111)
Creatinine, Ser: 0.81 mg/dL (ref 0.44–1.00)
GFR, Estimated: >60 mL/min (ref >60)
Glucose, Bld: 113 mg/dL — ABNORMAL HIGH (ref 70–99)
Potassium: 3.6 mmol/L (ref 3.5–5.1)
Sodium: 137 mmol/L (ref 135–145)
Total Bilirubin: 0.9 mg/dL (ref 0.3–1.2)
Total Protein: 7.7 g/dL (ref 6.5–8.1)

## 2021-02-25 LAB — CBC
HCT: 39.2 % (ref 36.0–46.0)
Hemoglobin: 13.1 g/dL (ref 12.0–15.0)
MCH: 31 pg (ref 26.0–34.0)
MCHC: 33.4 g/dL (ref 30.0–36.0)
MCV: 92.7 fL (ref 80.0–100.0)
Platelets: 184 10*3/uL (ref 150–400)
RBC: 4.23 MIL/uL (ref 3.87–5.11)
RDW: 14.5 % (ref 11.5–15.5)
WBC: 3.8 10*3/uL — ABNORMAL LOW (ref 4.0–10.5)
nRBC: 0 % (ref 0.0–0.2)

## 2021-02-25 LAB — PREGNANCY, URINE: Preg Test, Ur: NEGATIVE

## 2021-02-25 MED ORDER — IBUPROFEN 800 MG PO TABS
800.0000 mg | ORAL_TABLET | Freq: Three times a day (TID) | ORAL | 0 refills | Status: DC | PRN
  Filled 2021-02-25: qty 30, 10d supply, fill #0

## 2021-02-25 NOTE — Telephone Encounter (Signed)
Spoke with [name] this afternoon and reviewed her recent lab work (CBC,CMP). Per [name], NP potassium is within normal range (3.6) and liver enzymes look better, its ok to take tylenol. Patient verbalized understanding.   Advised patient that Dr. Melissa Montane office has been contacted and he is planning on removing her port tomorrow and she does not need to follow any special diet today. Patient happy with this news. Instructed to call with any needs.

## 2021-02-25 NOTE — Anesthesia Preprocedure Evaluation (Addendum)
Anesthesia Evaluation  Patient identified by MRN, date of birth, ID band Patient awake    Reviewed: Allergy & Precautions, NPO status , Patient's Chart, lab work & pertinent test results  Airway Mallampati: II  TM Distance: >3 FB Neck ROM: Full    Dental  (+) Partial Upper, Dental Advisory Given   Pulmonary neg pulmonary ROS,    Pulmonary exam normal breath sounds clear to auscultation       Cardiovascular hypertension, Normal cardiovascular exam Rhythm:Regular Rate:Normal     Neuro/Psych negative neurological ROS  negative psych ROS   GI/Hepatic negative GI ROS, Neg liver ROS,   Endo/Other  Morbid obesity  Renal/GU negative Renal ROS  negative genitourinary   Musculoskeletal negative musculoskeletal ROS (+)   Abdominal   Peds negative pediatric ROS (+)  Hematology negative hematology ROS (+)   Anesthesia Other Findings   Reproductive/Obstetrics negative OB ROS                            Anesthesia Physical Anesthesia Plan  ASA: 2  Anesthesia Plan: General   Post-op Pain Management: Dilaudid IV   Induction: Intravenous  PONV Risk Score and Plan: 3 and Ondansetron, Dexamethasone, Droperidol, Midazolam and Treatment may vary due to age or medical condition  Airway Management Planned: Oral ETT  Additional Equipment:   Intra-op Plan:   Post-operative Plan: Extubation in OR  Informed Consent: I have reviewed the patients History and Physical, chart, labs and discussed the procedure including the risks, benefits and alternatives for the proposed anesthesia with the patient or authorized representative who has indicated his/her understanding and acceptance.     Dental advisory given  Plan Discussed with: CRNA and Surgeon  Anesthesia Plan Comments:         Anesthesia Quick Evaluation

## 2021-02-25 NOTE — Progress Notes (Signed)
Patient here for a pre-operative appointment prior to her scheduled surgery on February 26, 2021. She is scheduled for robotic assisted laparoscopic bilateral salpingo-oophorectomy. She had her pre-admission testing appointment this am at [facility]  The surgery was discussed in detail.  See after visit summary for additional details. Visual aids used to discuss items related to surgery including sequential compression stockings, foley catheter, IV pump, multi-modal pain regimen including tylenol, photo of the surgical robot, female reproductive system to discuss surgery in detail.      Discussed post-op pain management in detail including the aspects of the enhanced recovery pathway.  She has tramadol at home and will contact the office if additional refills are needed. We discussed the use of tylenol post-op and to monitor for a maximum of 4,000 mg in a 24 hour period but advised her we would follow up on her lab work from today to make sure there has been improvement in her liver enzymes that were elevated earlier this month. She will plan to continue Colace at home. Discussed bowel regimen in detail.     Discussed the use of heparin pre-op, SCDs, and measures to take at home to prevent DVT including frequent mobility.  Reportable signs and symptoms of DVT discussed. Post-operative instructions discussed and expectations for after surgery. Incisional care discussed as well including reportable signs and symptoms including erythema, drainage, wound separation.     10 minutes spent with the patient.  Verbalizing understanding of material discussed. No needs or concerns voiced at the end of the visit.   Advised patient to call for any needs.  Advised that her post-operative medication (ibup) had been prescribed and could be picked up at any time.   Our office will follow up with Dr. Cristal Generous office about port-a-cath removal at the time of surgery tomorrow along with the need for follow up with Dr.  Lindi Adie. CBC discussed and there has been improvement in white blood cell count. She did not take her lynparza for the past two days and states she did not take this over the weekend when she was out of town.   This appointment is included in the global surgical bundle as pre-operative teaching and has no charge.

## 2021-02-25 NOTE — Patient Instructions (Addendum)
Preparing for your Surgery  Plan for surgery on February 26, 2021 with Dr. Jeral Pinch at [facility] along with [name]. You will be scheduled for robotic assisted laparoscopic bilateral salpingo-oophorectomy (removal of both ovaries and fallopian tubes).   Pre-operative Testing -(DONE) You will receive a phone call from presurgical testing at [facility] to arrange for a pre-operative appointment and lab work.  -Bring your insurance card, copy of an advanced directive if applicable, medication list  -At that visit, you will be asked to sign a consent for a possible blood transfusion in case a transfusion becomes necessary during surgery.  The need for a blood transfusion is rare but having consent is a necessary part of your care.     -You should not be taking blood thinners or aspirin at least ten days prior to surgery unless instructed by your surgeon.  -Do not take supplements such as fish oil (omega 3), red yeast rice, turmeric before your surgery. You want to avoid medications with aspirin in them including headache powders such as BC or Goody's), Excedrin migraine.  Day Before Surgery at [place] DIET PER DR. Cristal Generous INSTRUCTIONS. You will be asked to take in a light diet the day before surgery. You will be advised you can have clear liquids up until 3 hours before your surgery.    Eat a light diet the day before surgery.  Examples including soups, broths, toast, yogurt, mashed potatoes.  AVOID GAS PRODUCING FOODS. Things to avoid include carbonated beverages (fizzy beverages, sodas), raw fruits and raw vegetables (uncooked), or beans.   If your bowels are filled with gas, your surgeon will have difficulty visualizing your pelvic organs which increases your surgical risks.  Your role in recovery Your role is to become active as soon as directed by your doctor, while still giving yourself time to heal.  Rest when you feel tired. You will be asked  to do the following in order to speed your recovery:  - Cough and breathe deeply. This helps to clear and expand your lungs and can prevent pneumonia after surgery.  - Violet. Do mild physical activity. Walking or moving your legs help your circulation and body functions return to normal. Do not try to get up or walk alone the first time after surgery.   -If you develop swelling on one leg or the other, pain in the back of your leg, redness/warmth in one of your legs, please call the office or go to the Emergency Room to have a doppler to rule out a blood clot. For shortness of breath, chest pain-seek care in the Emergency Room as soon as possible. - Actively manage your pain. Managing your pain lets you move in comfort. We will ask you to rate your pain on a scale of zero to 10. It is your responsibility to tell your doctor or nurse where and how much you hurt so your pain can be treated.  Special Considerations -If you are diabetic, you may be placed on insulin after surgery to have closer control over your blood sugars to promote healing and recovery.  This does not mean that you will be discharged on insulin.  If applicable, your oral antidiabetics will be resumed when you are tolerating a solid diet.  -Your final pathology results from surgery should be available around one week after surgery and the results will be relayed to you when available.  -Dr. Lahoma Crocker is the surgeon  that assists your GYN Oncologist with surgery.  If you end up staying the night, the next day after your surgery you will either see Dr. Berline Lopes or Dr. Lahoma Crocker.  -FMLA forms can be faxed to [phone] and please allow 5-7 business days for completion.  Pain Management After Surgery -You can use the tramadol you have at home for pain and if a different medication is needed, we can always send this in for you.  -Make sure that you have Tylenol and Ibuprofen at home to use on a  regular basis after surgery for pain control. We recommend alternating the medications every hour to six hours since they work differently and are processed in the body differently for pain relief.  -Review the attached handout on narcotic use and their risks and side effects.   Bowel Regimen -You can continue taking your Colace at home and you may need to increase to twice a day after surgery. It is important to prevent constipation and drink adequate amounts of liquids.   Risks of Surgery Risks of surgery are low but include bleeding, infection, damage to surrounding structures, re-operation, blood clots, and very rarely death.   Blood Transfusion Information (For the consent to be signed before surgery)  We will be checking your blood type before surgery so in case of emergencies, we will know what type of blood you would need.                                            WHAT IS A BLOOD TRANSFUSION?  A transfusion is the replacement of blood or some of its parts. Blood is made up of multiple cells which provide different functions. Red blood cells carry oxygen and are used for blood loss replacement. White blood cells fight against infection. Platelets control bleeding. Plasma helps clot blood. Other blood products are available for specialized needs, such as hemophilia or other clotting disorders. BEFORE THE TRANSFUSION  Who gives blood for transfusions?  You may be able to donate blood to be used at a later date on yourself (autologous donation). Relatives can be asked to donate blood. This is generally not any safer than if you have received blood from a stranger. The same precautions are taken to ensure safety when a relative's blood is donated. Healthy volunteers who are fully evaluated to make sure their blood is safe. This is blood bank blood. Transfusion therapy is the safest it has ever been in the practice of medicine. Before blood is taken from a donor, a complete history is  taken to make sure that person has no history of diseases nor engages in risky social behavior (examples are intravenous drug use or sexual activity with multiple partners). The donor's travel history is screened to minimize risk of transmitting infections, such as malaria. The donated blood is tested for signs of infectious diseases, such as HIV and hepatitis. The blood is then tested to be sure it is compatible with you in order to minimize the chance of a transfusion reaction. If you or a relative donates blood, this is often done in anticipation of surgery and is not appropriate for emergency situations. It takes many days to process the donated blood. RISKS AND COMPLICATIONS Although transfusion therapy is very safe and saves many lives, the main dangers of transfusion include:  Getting an infectious disease. Developing a transfusion reaction. This is  an allergic reaction to something in the blood you were given. Every precaution is taken to prevent this. The decision to have a blood transfusion has been considered carefully by your caregiver before blood is given. Blood is not given unless the benefits outweigh the risks.  AFTER SURGERY INSTRUCTIONS  Return to work: 4-6 weeks if applicable  You will need to be off of your lynparza for one week after surgery.   Activity: 1. Be up and out of the bed during the day.  Take a nap if needed.  You may walk up steps but be careful and use the hand rail.  Stair climbing will tire you more than you think, you may need to stop part way and rest.   2. No lifting or straining for 6 weeks over 10 pounds. No pushing, pulling, straining for 6 weeks.  3. No driving for around 1 week(s).  Do not drive if you are taking narcotic pain medicine and make sure that your reaction time has returned.   4. You can shower as soon as the next day after surgery. Shower daily.  Use your regular soap and water (not directly on the incision) and pat your incision(s) dry  afterwards; don't rub.  No tub baths or submerging your body in water until cleared by your surgeon. If you have the soap that was given to you by pre-surgical testing that was used before surgery, you do not need to use it afterwards because this can irritate your incisions.   5. No sexual activity and nothing in the vagina for 4 weeks.  6. You may experience a small amount of clear drainage from your incisions, which is normal.  If the drainage persists, increases, or changes color please call the office.  7. Do not use creams, lotions, or ointments such as neosporin on your incisions after surgery until advised by your surgeon because they can cause removal of the dermabond glue on your incisions.    8. You may experience vaginal spotting after surgery. The spotting is normal but if you experience heavy bleeding, call our office.  9. Take Tylenol (we will see how your labs look from today and advise you based on the results) or ibuprofen first for pain and only use narcotic pain medication for severe pain not relieved by the Tylenol or Ibuprofen.  Monitor your Tylenol intake to a max of 4,000 mg in a 24 hour period. You can alternate these medications after surgery.  Diet: 1. Low sodium Heart Healthy Diet is recommended but you are cleared to resume your normal (before surgery) diet after your procedure.  2. It is safe to use a laxative, such as Miralax or Colace, if you have difficulty moving your bowels. You can continue using your Colace at home.    Wound Care: 1. Keep clean and dry.  Shower daily.  Reasons to call the Doctor: Fever - Oral temperature greater than 100.4 degrees Fahrenheit Foul-smelling vaginal discharge Difficulty urinating Nausea and vomiting Increased pain at the site of the incision that is unrelieved with pain medicine. Difficulty breathing with or without chest pain New calf pain especially if only on one side Sudden, continuing increased vaginal bleeding with  or without clots.   Contacts: For questions or concerns you should contact:  Dr. Jeral Pinch at [phone]  [name], NP at [phone]  After Hours: call [phone] and have the GYN Oncologist paged/contacted (after 5 pm or on the weekends).  Messages sent via mychart are for  non-urgent matters and are not responded to after hours so for urgent needs, please call the after hours number.

## 2021-02-26 ENCOUNTER — Encounter: Payer: Self-pay | Admitting: Oncology

## 2021-02-26 ENCOUNTER — Encounter (HOSPITAL_COMMUNITY)
Admission: RE | Disposition: A | Discharge status: Home / Self Care | Payer: Self-pay | Source: Home / Self Care | Attending: General Surgery

## 2021-02-26 ENCOUNTER — Encounter (HOSPITAL_COMMUNITY): Payer: Self-pay | Admitting: Gynecologic Oncology

## 2021-02-26 ENCOUNTER — Telehealth: Payer: Self-pay | Admitting: Hematology and Oncology

## 2021-02-26 ENCOUNTER — Observation Stay (HOSPITAL_COMMUNITY)
Admission: RE | Admit: 2021-02-26 | Discharge: 2021-02-27 | Disposition: A | Discharge status: Home / Self Care | Payer: Medicaid Other | Attending: General Surgery | Admitting: General Surgery

## 2021-02-26 ENCOUNTER — Ambulatory Visit (HOSPITAL_COMMUNITY): Payer: Medicaid Other | Admitting: Anesthesiology

## 2021-02-26 ENCOUNTER — Ambulatory Visit (HOSPITAL_BASED_OUTPATIENT_CLINIC_OR_DEPARTMENT_OTHER): Payer: Medicaid Other | Admitting: Anesthesiology

## 2021-02-26 ENCOUNTER — Other Ambulatory Visit: Payer: Self-pay

## 2021-02-26 DIAGNOSIS — Z1509 Genetic susceptibility to other malignant neoplasm: Secondary | ICD-10-CM | POA: Diagnosis not present

## 2021-02-26 DIAGNOSIS — Z1502 Genetic susceptibility to malignant neoplasm of ovary: Secondary | ICD-10-CM

## 2021-02-26 DIAGNOSIS — K432 Incisional hernia without obstruction or gangrene: Secondary | ICD-10-CM | POA: Diagnosis present

## 2021-02-26 DIAGNOSIS — Z148 Genetic carrier of other disease: Secondary | ICD-10-CM | POA: Diagnosis not present

## 2021-02-26 DIAGNOSIS — Z9013 Acquired absence of bilateral breasts and nipples: Secondary | ICD-10-CM | POA: Insufficient documentation

## 2021-02-26 DIAGNOSIS — I10 Essential (primary) hypertension: Secondary | ICD-10-CM | POA: Diagnosis not present

## 2021-02-26 DIAGNOSIS — Z4003 Encounter for prophylactic removal of fallopian tube(s): Secondary | ICD-10-CM | POA: Diagnosis not present

## 2021-02-26 DIAGNOSIS — K429 Umbilical hernia without obstruction or gangrene: Principal | ICD-10-CM | POA: Insufficient documentation

## 2021-02-26 DIAGNOSIS — Z1501 Genetic susceptibility to malignant neoplasm of breast: Secondary | ICD-10-CM

## 2021-02-26 DIAGNOSIS — Z853 Personal history of malignant neoplasm of breast: Secondary | ICD-10-CM

## 2021-02-26 DIAGNOSIS — Z4002 Encounter for prophylactic removal of ovary: Secondary | ICD-10-CM

## 2021-02-26 DIAGNOSIS — C50912 Malignant neoplasm of unspecified site of left female breast: Secondary | ICD-10-CM

## 2021-02-26 HISTORY — PX: ROBOTIC ASSISTED TOTAL HYSTERECTOMY WITH BILATERAL SALPINGO OOPHERECTOMY: SHX6086

## 2021-02-26 HISTORY — PX: PORT-A-CATH REMOVAL: SHX5289

## 2021-02-26 HISTORY — PX: INCISIONAL HERNIA REPAIR: SHX193

## 2021-02-26 LAB — TYPE AND SCREEN
ABO/RH(D): B POS
Antibody Screen: NEGATIVE

## 2021-02-26 SURGERY — HYSTERECTOMY, TOTAL, ROBOT-ASSISTED, LAPAROSCOPIC, WITH BILATERAL SALPINGO-OOPHORECTOMY
Anesthesia: General | Site: Pelvis | Laterality: Right

## 2021-02-26 MED ORDER — ACETAMINOPHEN 500 MG PO TABS
1000.0000 mg | ORAL_TABLET | ORAL | Status: AC
  Administered 2021-02-26: 1000 mg via ORAL
  Filled 2021-02-26: qty 2

## 2021-02-26 MED ORDER — ENOXAPARIN SODIUM 40 MG/0.4ML IJ SOSY
40.0000 mg | PREFILLED_SYRINGE | INTRAMUSCULAR | Status: DC
  Administered 2021-02-27: 40 mg via SUBCUTANEOUS
  Filled 2021-02-26: qty 0.4

## 2021-02-26 MED ORDER — ONDANSETRON HCL 4 MG/2ML IJ SOLN
INTRAMUSCULAR | Status: AC
  Filled 2021-02-26: qty 2

## 2021-02-26 MED ORDER — EPHEDRINE SULFATE-NACL 50-0.9 MG/10ML-% IV SOSY
PREFILLED_SYRINGE | INTRAVENOUS | Status: DC | PRN
  Administered 2021-02-26: 10 mg via INTRAVENOUS

## 2021-02-26 MED ORDER — ACETAMINOPHEN 500 MG PO TABS
1000.0000 mg | ORAL_TABLET | Freq: Four times a day (QID) | ORAL | Status: DC
  Administered 2021-02-26 – 2021-02-27 (×2): 1000 mg via ORAL
  Filled 2021-02-26 (×2): qty 2

## 2021-02-26 MED ORDER — DOCUSATE SODIUM 100 MG PO CAPS
100.0000 mg | ORAL_CAPSULE | Freq: Every day | ORAL | Status: DC
  Administered 2021-02-26 – 2021-02-27 (×2): 100 mg via ORAL
  Filled 2021-02-26 (×2): qty 1

## 2021-02-26 MED ORDER — HYDROMORPHONE HCL 1 MG/ML IJ SOLN
0.2500 mg | INTRAMUSCULAR | Status: DC | PRN
  Administered 2021-02-26 (×2): 0.5 mg via INTRAVENOUS

## 2021-02-26 MED ORDER — ONDANSETRON HCL 4 MG/2ML IJ SOLN: 4.0000 mg | Freq: Four times a day (QID) | INTRAMUSCULAR | Status: DC | PRN

## 2021-02-26 MED ORDER — BUPIVACAINE-EPINEPHRINE (PF) 0.25% -1:200000 IJ SOLN
INTRAMUSCULAR | Status: AC
  Filled 2021-02-26: qty 30

## 2021-02-26 MED ORDER — ONDANSETRON HCL 4 MG/2ML IJ SOLN
4.0000 mg | Freq: Once | INTRAMUSCULAR | Status: AC | PRN
  Administered 2021-02-26: 4 mg via INTRAVENOUS

## 2021-02-26 MED ORDER — CHLORHEXIDINE GLUCONATE 0.12 % MT SOLN
15.0000 mL | Freq: Once | OROMUCOSAL | Status: AC
  Administered 2021-02-26: 15 mL via OROMUCOSAL

## 2021-02-26 MED ORDER — LIDOCAINE HCL (PF) 1 % IJ SOLN
INTRAMUSCULAR | Status: AC
  Filled 2021-02-26: qty 30

## 2021-02-26 MED ORDER — ESMOLOL HCL 100 MG/10ML IV SOLN
INTRAVENOUS | Status: AC
  Filled 2021-02-26: qty 10

## 2021-02-26 MED ORDER — OXYCODONE HCL 5 MG PO TABS
5.0000 mg | ORAL_TABLET | ORAL | Status: DC | PRN
  Administered 2021-02-26 – 2021-02-27 (×3): 10 mg via ORAL
  Filled 2021-02-26 (×3): qty 2

## 2021-02-26 MED ORDER — MIDAZOLAM HCL 2 MG/2ML IJ SOLN
INTRAMUSCULAR | Status: AC
  Filled 2021-02-26: qty 2

## 2021-02-26 MED ORDER — ONDANSETRON 4 MG PO TBDP: 4.0000 mg | ORAL_TABLET | Freq: Four times a day (QID) | ORAL | Status: DC | PRN

## 2021-02-26 MED ORDER — LIDOCAINE 2% (20 MG/ML) 5 ML SYRINGE
INTRAMUSCULAR | Status: DC | PRN
  Administered 2021-02-26: 100 mg via INTRAVENOUS

## 2021-02-26 MED ORDER — BUPIVACAINE HCL 0.25 % IJ SOLN
INTRAMUSCULAR | Status: DC | PRN
  Administered 2021-02-26: 25 mL

## 2021-02-26 MED ORDER — MORPHINE SULFATE (PF) 2 MG/ML IV SOLN
2.0000 mg | INTRAVENOUS | Status: DC | PRN
  Administered 2021-02-26 (×2): 2 mg via INTRAVENOUS
  Filled 2021-02-26 (×2): qty 1

## 2021-02-26 MED ORDER — HYDROMORPHONE HCL 1 MG/ML IJ SOLN
INTRAMUSCULAR | Status: AC
  Administered 2021-02-26: 0.5 mg via INTRAVENOUS
  Filled 2021-02-26: qty 2

## 2021-02-26 MED ORDER — FENTANYL CITRATE (PF) 100 MCG/2ML IJ SOLN
INTRAMUSCULAR | Status: AC
  Filled 2021-02-26: qty 2

## 2021-02-26 MED ORDER — HEPARIN SODIUM (PORCINE) 5000 UNIT/ML IJ SOLN
5000.0000 [IU] | INTRAMUSCULAR | Status: AC
  Administered 2021-02-26: 5000 [IU] via SUBCUTANEOUS
  Filled 2021-02-26: qty 1

## 2021-02-26 MED ORDER — SIMETHICONE 80 MG PO CHEW: 40.0000 mg | CHEWABLE_TABLET | Freq: Four times a day (QID) | ORAL | Status: DC | PRN

## 2021-02-26 MED ORDER — FENTANYL CITRATE (PF) 100 MCG/2ML IJ SOLN
INTRAMUSCULAR | Status: DC | PRN
  Administered 2021-02-26 (×2): 50 ug via INTRAVENOUS

## 2021-02-26 MED ORDER — SUGAMMADEX SODIUM 500 MG/5ML IV SOLN
INTRAVENOUS | Status: AC
  Filled 2021-02-26: qty 5

## 2021-02-26 MED ORDER — ROCURONIUM BROMIDE 10 MG/ML (PF) SYRINGE
PREFILLED_SYRINGE | INTRAVENOUS | Status: AC
  Filled 2021-02-26: qty 10

## 2021-02-26 MED ORDER — OXYCODONE HCL 5 MG PO TABS
ORAL_TABLET | ORAL | Status: AC
  Administered 2021-02-26: 5 mg via ORAL
  Filled 2021-02-26: qty 1

## 2021-02-26 MED ORDER — OXYCODONE HCL 5 MG/5ML PO SOLN: 5.0000 mg | Freq: Once | ORAL | Status: AC | PRN

## 2021-02-26 MED ORDER — BUPIVACAINE HCL 0.25 % IJ SOLN
INTRAMUSCULAR | Status: AC
  Filled 2021-02-26: qty 1

## 2021-02-26 MED ORDER — SCOPOLAMINE 1 MG/3DAYS TD PT72
1.0000 | MEDICATED_PATCH | TRANSDERMAL | Status: DC
  Administered 2021-02-26: 1.5 mg via TRANSDERMAL
  Filled 2021-02-26: qty 1

## 2021-02-26 MED ORDER — SODIUM CHLORIDE 0.9 % IV SOLN: INTRAVENOUS | Status: DC

## 2021-02-26 MED ORDER — METHOCARBAMOL 500 MG PO TABS
500.0000 mg | ORAL_TABLET | Freq: Four times a day (QID) | ORAL | Status: DC | PRN
  Administered 2021-02-26 – 2021-02-27 (×2): 500 mg via ORAL
  Filled 2021-02-26 (×2): qty 1

## 2021-02-26 MED ORDER — GABAPENTIN 100 MG PO CAPS: 100.0000 mg | ORAL_CAPSULE | Freq: Two times a day (BID) | ORAL | Status: DC | PRN

## 2021-02-26 MED ORDER — DEXAMETHASONE SODIUM PHOSPHATE 10 MG/ML IJ SOLN
INTRAMUSCULAR | Status: DC | PRN
  Administered 2021-02-26: 4 mg via INTRAVENOUS

## 2021-02-26 MED ORDER — PROPOFOL 10 MG/ML IV BOLUS
INTRAVENOUS | Status: AC
  Filled 2021-02-26: qty 20

## 2021-02-26 MED ORDER — LACTATED RINGERS IV SOLN: INTRAVENOUS | Status: DC

## 2021-02-26 MED ORDER — ENSURE PRE-SURGERY PO LIQD
296.0000 mL | Freq: Once | ORAL | Status: DC
  Filled 2021-02-26: qty 296

## 2021-02-26 MED ORDER — CHLORHEXIDINE GLUCONATE CLOTH 2 % EX PADS: 6.0000 | MEDICATED_PAD | Freq: Once | CUTANEOUS | Status: DC

## 2021-02-26 MED ORDER — DROPERIDOL 2.5 MG/ML IJ SOLN
INTRAMUSCULAR | Status: AC
  Filled 2021-02-26: qty 2

## 2021-02-26 MED ORDER — CELECOXIB 200 MG PO CAPS
200.0000 mg | ORAL_CAPSULE | ORAL | Status: AC
  Administered 2021-02-26: 200 mg via ORAL
  Filled 2021-02-26: qty 1

## 2021-02-26 MED ORDER — CEFAZOLIN SODIUM-DEXTROSE 2-4 GM/100ML-% IV SOLN
2.0000 g | INTRAVENOUS | Status: AC
  Administered 2021-02-26: 2 g via INTRAVENOUS
  Filled 2021-02-26: qty 100

## 2021-02-26 MED ORDER — ONDANSETRON HCL 4 MG/2ML IJ SOLN
INTRAMUSCULAR | Status: DC | PRN
  Administered 2021-02-26: 4 mg via INTRAVENOUS

## 2021-02-26 MED ORDER — MIDAZOLAM HCL 2 MG/2ML IJ SOLN
INTRAMUSCULAR | Status: DC | PRN
  Administered 2021-02-26: 2 mg via INTRAVENOUS

## 2021-02-26 MED ORDER — ORAL CARE MOUTH RINSE: 15.0000 mL | Freq: Once | OROMUCOSAL | Status: AC

## 2021-02-26 MED ORDER — DEXAMETHASONE SODIUM PHOSPHATE 4 MG/ML IJ SOLN: 4.0000 mg | INTRAMUSCULAR | Status: DC

## 2021-02-26 MED ORDER — PROPOFOL 10 MG/ML IV BOLUS
INTRAVENOUS | Status: DC | PRN
  Administered 2021-02-26: 200 mg via INTRAVENOUS

## 2021-02-26 MED ORDER — SUGAMMADEX SODIUM 500 MG/5ML IV SOLN
INTRAVENOUS | Status: DC | PRN
  Administered 2021-02-26: 250 mg via INTRAVENOUS

## 2021-02-26 MED ORDER — OXYCODONE HCL 5 MG PO TABS: 5.0000 mg | ORAL_TABLET | Freq: Once | ORAL | Status: AC | PRN

## 2021-02-26 MED ORDER — STERILE WATER FOR IRRIGATION IR SOLN
Status: DC | PRN
  Administered 2021-02-26: 1000 mL

## 2021-02-26 MED ORDER — DROPERIDOL 2.5 MG/ML IJ SOLN
INTRAMUSCULAR | Status: DC | PRN
  Administered 2021-02-26: .625 mg via INTRAVENOUS

## 2021-02-26 MED ORDER — PHENYLEPHRINE 40 MCG/ML (10ML) SYRINGE FOR IV PUSH (FOR BLOOD PRESSURE SUPPORT)
PREFILLED_SYRINGE | INTRAVENOUS | Status: DC | PRN
  Administered 2021-02-26 (×3): 120 ug via INTRAVENOUS

## 2021-02-26 MED ORDER — LACTATED RINGERS IR SOLN
Status: DC | PRN
  Administered 2021-02-26: 1000 mL

## 2021-02-26 MED ORDER — LIDOCAINE HCL (PF) 2 % IJ SOLN
INTRAMUSCULAR | Status: AC
  Filled 2021-02-26: qty 5

## 2021-02-26 MED ORDER — DOCUSATE SODIUM 100 MG PO CAPS: 100.0000 mg | ORAL_CAPSULE | Freq: Every day | ORAL | Status: DC

## 2021-02-26 MED ORDER — GABAPENTIN 300 MG PO CAPS
600.0000 mg | ORAL_CAPSULE | Freq: Every day | ORAL | Status: DC
Start: 2021-02-26 — End: 2021-02-27
  Administered 2021-02-26: 600 mg via ORAL
  Filled 2021-02-26: qty 2

## 2021-02-26 MED ORDER — DEXAMETHASONE SODIUM PHOSPHATE 10 MG/ML IJ SOLN
INTRAMUSCULAR | Status: AC
  Filled 2021-02-26: qty 1

## 2021-02-26 MED ORDER — ROCURONIUM BROMIDE 10 MG/ML (PF) SYRINGE
PREFILLED_SYRINGE | INTRAVENOUS | Status: DC | PRN
  Administered 2021-02-26: 80 mg via INTRAVENOUS
  Administered 2021-02-26: 20 mg via INTRAVENOUS
  Administered 2021-02-26: 10 mg via INTRAVENOUS

## 2021-02-26 SURGICAL SUPPLY — 132 items
ADH SKN CLS APL DERMABOND .7 (GAUZE/BANDAGES/DRESSINGS) ×3
AGENT HMST KT MTR STRL THRMB (HEMOSTASIS)
APL ESCP 34 STRL LF DISP (HEMOSTASIS)
APPLICATOR SURGIFLO ENDO (HEMOSTASIS) IMPLANT
BACTOSHIELD CHG 4% 4OZ (MISCELLANEOUS) ×1
BAG COUNTER SPONGE SURGICOUNT (BAG) IMPLANT
BAG LAPAROSCOPIC 12 15 PORT 16 (BASKET) IMPLANT
BAG RETRIEVAL 10 (BASKET) ×1
BAG RETRIEVAL 12/15 (BASKET)
BAG SPEC RTRVL LRG 6X4 10 (ENDOMECHANICALS)
BAG SPNG CNTER NS LX DISP (BAG)
BINDER ABDOMINAL 12 ML 46-62 (SOFTGOODS) IMPLANT
BLADE HEX COATED 2.75 (ELECTRODE) ×4 IMPLANT
BLADE SURG 15 STRL LF DISP TIS (BLADE) ×4 IMPLANT
BLADE SURG 15 STRL SS (BLADE) ×4
BLADE SURG SZ10 CARB STEEL (BLADE) IMPLANT
BLADE SURG SZ11 CARB STEEL (BLADE) ×4 IMPLANT
CABLE HIGH FREQUENCY MONO STRZ (ELECTRODE) ×4 IMPLANT
COVER BACK TABLE 60X90IN (DRAPES) ×5 IMPLANT
COVER SURGICAL LIGHT HANDLE (MISCELLANEOUS) ×5 IMPLANT
COVER TIP SHEARS 8 DVNC (MISCELLANEOUS) ×4 IMPLANT
COVER TIP SHEARS 8MM DA VINCI (MISCELLANEOUS) ×4
DERMABOND ADVANCED (GAUZE/BANDAGES/DRESSINGS) ×1
DERMABOND ADVANCED .7 DNX12 (GAUZE/BANDAGES/DRESSINGS) ×8 IMPLANT
DEVICE SECURE STRAP 25 ABSORB (INSTRUMENTS) ×2 IMPLANT
DRAPE ARM DVNC X/XI (DISPOSABLE) ×16 IMPLANT
DRAPE COLUMN DVNC XI (DISPOSABLE) ×4 IMPLANT
DRAPE DA VINCI XI ARM (DISPOSABLE) ×16
DRAPE DA VINCI XI COLUMN (DISPOSABLE) ×4
DRAPE INCISE IOBAN 66X45 STRL (DRAPES) ×5 IMPLANT
DRAPE LAPAROTOMY TRNSV 102X78 (DRAPES) ×4 IMPLANT
DRAPE SHEET LG 3/4 BI-LAMINATE (DRAPES) ×5 IMPLANT
DRAPE SURG IRRIG POUCH 19X23 (DRAPES) ×5 IMPLANT
DRSG OPSITE POSTOP 4X6 (GAUZE/BANDAGES/DRESSINGS) IMPLANT
DRSG OPSITE POSTOP 4X8 (GAUZE/BANDAGES/DRESSINGS) IMPLANT
ELECT PENCIL ROCKER SW 15FT (MISCELLANEOUS) ×1 IMPLANT
ELECT REM PT RETURN 15FT ADLT (MISCELLANEOUS) ×10 IMPLANT
GAUZE 4X4 16PLY ~~LOC~~+RFID DBL (SPONGE) ×5 IMPLANT
GAUZE SPONGE 4X4 12PLY STRL (GAUZE/BANDAGES/DRESSINGS) IMPLANT
GLOVE SURG ENC MOIS LTX SZ6 (GLOVE) ×20 IMPLANT
GLOVE SURG ENC MOIS LTX SZ6.5 (GLOVE) ×11 IMPLANT
GLOVE SURG ENC MOIS LTX SZ7 (GLOVE) ×5 IMPLANT
GLOVE SURG POLYISO LF SZ7 (GLOVE) ×1 IMPLANT
GLOVE SURG UNDER POLY LF SZ6.5 (GLOVE) ×1 IMPLANT
GLOVE SURG UNDER POLY LF SZ7 (GLOVE) ×5 IMPLANT
GLOVE SURG UNDER POLY LF SZ7.5 (GLOVE) ×5 IMPLANT
GOWN STRL REUS W/ TWL LRG LVL3 (GOWN DISPOSABLE) ×20 IMPLANT
GOWN STRL REUS W/ TWL XL LVL3 (GOWN DISPOSABLE) ×4 IMPLANT
GOWN STRL REUS W/TWL LRG LVL3 (GOWN DISPOSABLE) ×29 IMPLANT
GOWN STRL REUS W/TWL XL LVL3 (GOWN DISPOSABLE) ×4 IMPLANT
GRASPER SUT TROCAR 14GX15 (MISCELLANEOUS) ×1 IMPLANT
HOLDER FOLEY CATH W/STRAP (MISCELLANEOUS) IMPLANT
IRRIG SUCT STRYKERFLOW 2 WTIP (MISCELLANEOUS) ×4
IRRIGATION SUCT STRKRFLW 2 WTP (MISCELLANEOUS) ×4 IMPLANT
IV LACTATED RINGERS 1000ML (IV SOLUTION) ×1 IMPLANT
KIT BASIN OR (CUSTOM PROCEDURE TRAY) ×4 IMPLANT
KIT PROCEDURE DA VINCI SI (MISCELLANEOUS)
KIT PROCEDURE DVNC SI (MISCELLANEOUS) IMPLANT
KIT TURNOVER KIT A (KITS) IMPLANT
MANIPULATOR ADVINCU DEL 3.0 PL (MISCELLANEOUS) IMPLANT
MANIPULATOR ADVINCU DEL 3.5 PL (MISCELLANEOUS) IMPLANT
MANIPULATOR UTERINE 4.5 ZUMI (MISCELLANEOUS) IMPLANT
MARKER SKIN DUAL TIP RULER LAB (MISCELLANEOUS) ×5 IMPLANT
MESH VENTRALIGHT ST 6IN CRC (Mesh General) ×1 IMPLANT
NDL HYPO 21X1.5 SAFETY (NEEDLE) ×4 IMPLANT
NDL HYPO 25X1 1.5 SAFETY (NEEDLE) ×4 IMPLANT
NDL SPNL 18GX3.5 QUINCKE PK (NEEDLE) IMPLANT
NDL SPNL 22GX3.5 QUINCKE BK (NEEDLE) ×4 IMPLANT
NEEDLE HYPO 21X1.5 SAFETY (NEEDLE) ×4 IMPLANT
NEEDLE HYPO 25X1 1.5 SAFETY (NEEDLE) ×4 IMPLANT
NEEDLE SPNL 18GX3.5 QUINCKE PK (NEEDLE) IMPLANT
NEEDLE SPNL 22GX3.5 QUINCKE BK (NEEDLE) ×4 IMPLANT
NS IRRIG 1000ML POUR BTL (IV SOLUTION) ×4 IMPLANT
OBTURATOR OPTICAL STANDARD 8MM (TROCAR) ×4
OBTURATOR OPTICAL STND 8 DVNC (TROCAR) ×3
OBTURATOR OPTICALSTD 8 DVNC (TROCAR) ×4 IMPLANT
PACK BASIC VI WITH GOWN DISP (CUSTOM PROCEDURE TRAY) ×4 IMPLANT
PACK ROBOT GYN CUSTOM WL (TRAY / TRAY PROCEDURE) ×5 IMPLANT
PAD POSITIONING PINK XL (MISCELLANEOUS) ×5 IMPLANT
PENCIL SMOKE EVACUATOR (MISCELLANEOUS) IMPLANT
PORT ACCESS TROCAR AIRSEAL 12 (TROCAR) ×4 IMPLANT
PORT ACCESS TROCAR AIRSEAL 5M (TROCAR) ×1
POUCH SPECIMEN RETRIEVAL 10MM (ENDOMECHANICALS) IMPLANT
PROTECTOR NERVE ULNAR (MISCELLANEOUS) IMPLANT
SCISSORS LAP 5X35 DISP (ENDOMECHANICALS) ×4 IMPLANT
SCRUB CHG 4% DYNA-HEX 4OZ (MISCELLANEOUS) ×4 IMPLANT
SEAL CANN UNIV 5-8 DVNC XI (MISCELLANEOUS) ×16 IMPLANT
SEAL XI 5MM-8MM UNIVERSAL (MISCELLANEOUS) ×16
SET TRI-LUMEN FLTR TB AIRSEAL (TUBING) ×5 IMPLANT
SET TUBE SMOKE EVAC HIGH FLOW (TUBING) ×4 IMPLANT
SLEEVE XCEL OPT CAN 5 100 (ENDOMECHANICALS) ×1 IMPLANT
SOL ANTI FOG 6CC (MISCELLANEOUS) ×4 IMPLANT
SOLUTION ANTI FOG 6CC (MISCELLANEOUS)
SPIKE FLUID TRANSFER (MISCELLANEOUS) ×10 IMPLANT
SPONGE T-LAP 18X18 ~~LOC~~+RFID (SPONGE) IMPLANT
SPONGE T-LAP 4X18 ~~LOC~~+RFID (SPONGE) ×4 IMPLANT
STAPLER CANNULA SEAL DVNC XI (STAPLE) IMPLANT
STAPLER CANNULA SEAL XI (STAPLE) ×4
STAPLER VISISTAT 35W (STAPLE) ×4 IMPLANT
STRIP CLOSURE SKIN 1/2X4 (GAUZE/BANDAGES/DRESSINGS) ×1 IMPLANT
SURGIFLO W/THROMBIN 8M KIT (HEMOSTASIS) IMPLANT
SUT MNCRL AB 4-0 PS2 18 (SUTURE) ×5 IMPLANT
SUT NOVA 1 T20/GS 25DT (SUTURE) ×2 IMPLANT
SUT PDS AB 1 TP1 96 (SUTURE) IMPLANT
SUT PROLENE 0 CT 1 CR/8 (SUTURE) IMPLANT
SUT PROLENE 2 0 SH DA (SUTURE) IMPLANT
SUT VIC AB 0 CT1 27 (SUTURE)
SUT VIC AB 0 CT1 27XBRD ANTBC (SUTURE) IMPLANT
SUT VIC AB 2-0 CT1 27 (SUTURE)
SUT VIC AB 2-0 CT1 TAPERPNT 27 (SUTURE) IMPLANT
SUT VIC AB 3-0 SH 27 (SUTURE)
SUT VIC AB 3-0 SH 27XBRD (SUTURE) ×4 IMPLANT
SUT VIC AB 4-0 PS2 18 (SUTURE) ×10 IMPLANT
SYR 10ML LL (SYRINGE) IMPLANT
SYR 20ML LL LF (SYRINGE) ×5 IMPLANT
SYR BULB IRRIG 60ML STRL (SYRINGE) IMPLANT
SYS BAG RETRIEVAL 10MM (BASKET) ×3
SYSTEM BAG RETRIEVAL 10MM (BASKET) IMPLANT
TACKER 5MM HERNIA 3.5CML NAB (ENDOMECHANICALS) ×4 IMPLANT
TAPE CLOTH 4X10 WHT NS (GAUZE/BANDAGES/DRESSINGS) IMPLANT
TOWEL OR 17X26 10 PK STRL BLUE (TOWEL DISPOSABLE) ×5 IMPLANT
TOWEL OR NON WOVEN STRL DISP B (DISPOSABLE) ×4 IMPLANT
TRAP SPECIMEN MUCUS 40CC (MISCELLANEOUS) ×1 IMPLANT
TRAY FOLEY MTR SLVR 14FR STAT (SET/KITS/TRAYS/PACK) IMPLANT
TRAY FOLEY MTR SLVR 16FR STAT (SET/KITS/TRAYS/PACK) ×5 IMPLANT
TRAY LAPAROSCOPIC (CUSTOM PROCEDURE TRAY) ×4 IMPLANT
TROCAR BALLN 12MMX100 BLUNT (TROCAR) IMPLANT
TROCAR XCEL NON-BLD 11X100MML (ENDOMECHANICALS) ×5 IMPLANT
TROCAR XCEL NON-BLD 5MMX100MML (ENDOMECHANICALS) ×3 IMPLANT
UNDERPAD 30X36 HEAVY ABSORB (UNDERPADS AND DIAPERS) ×9 IMPLANT
WATER STERILE IRR 1000ML POUR (IV SOLUTION) ×5 IMPLANT
YANKAUER SUCT BULB TIP 10FT TU (MISCELLANEOUS) IMPLANT

## 2021-02-26 NOTE — Transfer of Care (Signed)
Immediate Anesthesia Transfer of Care Note  Patient: Sicilia D Freeman  Procedure(s) Performed: XI ROBOTIC ASSISTED BILATERAL SALPINGO OOPHORECTOMY (Pelvis) LAPAROSCOPIC INCISIONAL HERNIA WITH MESH (Abdomen) REMOVAL PORT-A-CATH (Right: Chest)  Patient Location: PACU  Anesthesia Type:General  Level of Consciousness: awake, alert , oriented and patient cooperative  Airway & Oxygen Therapy: Patient Spontanous Breathing and Patient connected to face mask oxygen  Post-op Assessment: Report given to RN and Post -op Vital signs reviewed and stable  Post vital signs: Reviewed and stable  Last Vitals:  Vitals Value Taken Time  BP 127/95 02/26/21 1117  Temp    Pulse 88 02/26/21 1124  Resp 20 02/26/21 1124  SpO2 94 % 02/26/21 1124  Vitals shown include unvalidated device data.  Last Pain:  Vitals:   02/26/21 0600  TempSrc:   PainSc: 0-No pain         Complications: No notable events documented.

## 2021-02-26 NOTE — Anesthesia Postprocedure Evaluation (Signed)
Anesthesia Post Note  Patient: [name]  Procedure(s) Performed: XI ROBOTIC ASSISTED BILATERAL SALPINGO OOPHORECTOMY (Pelvis) LAPAROSCOPIC INCISIONAL HERNIA WITH MESH (Abdomen) REMOVAL PORT-A-CATH (Right: Chest)     Patient location during evaluation: PACU Anesthesia Type: General Level of consciousness: awake and alert Pain management: pain level controlled Vital Signs Assessment: post-procedure vital signs reviewed and stable Respiratory status: spontaneous breathing, nonlabored ventilation, respiratory function stable and patient connected to nasal cannula oxygen Cardiovascular status: blood pressure returned to baseline and stable Postop Assessment: no apparent nausea or vomiting Anesthetic complications: no   No notable events documented.  Last Vitals:  Vitals:   02/26/21 1145 02/26/21 1200  BP: 133/81 130/77  Pulse: 88 77  Resp: 13 13  Temp:    SpO2: 90% 99%    Last Pain:  Vitals:   02/26/21 1200  TempSrc:   PainSc: 0-No pain                 Cobey Raineri S

## 2021-02-26 NOTE — Interval H&P Note (Signed)
History and Physical Interval Note:  02/26/2021 8:16 AM  [name]  has presented today for surgery, with the diagnosis of BRCA2 HX of Breast Ca.  The various methods of treatment have been discussed with the patient and family. After consideration of risks, benefits and other options for treatment, the patient has consented to  Procedure(s): XI ROBOTIC ASSISTED BILATERAL SALPINGO OOPHORECTOMY (N/A) LAPAROSCOPIC INCISIONAL HERNIA WITH MESH (N/A) REMOVAL PORT-A-CATH (N/A) as a surgical intervention.  The patient's history has been reviewed, patient examined, no change in status, stable for surgery.  I have reviewed the patient's chart and labs.  Questions were answered to the patient's satisfaction.     Rolm Bookbinder

## 2021-02-26 NOTE — Op Note (Signed)
OPERATIVE NOTE  Pre-operative Diagnosis: BRCA2 mutation, history of breast cancer x2, umbilical/incisional hernia, desires port removal  Post-operative Diagnosis: same  Operation: Robotic-assisted laparoscopic bilateral salpingoophorectomy   Surgeon: Jeral Pinch MD  Assistant Surgeon: Lahoma Crocker MD (an MD assistant was necessary for tissue manipulation, management of robotic instrumentation, retraction and positioning due to the complexity of the case and hospital policies).   Anesthesia: GET  Urine Output: see Dr. Cristal Generous op note  Operative Findings: On EUA, small mobile uterus, no adnexal masses. On intra-abdominal entry, normal upper abdominal survey. 3cm umbilical hernia containing omentum. Otherwise normal omentum. No ascites. Uterus 6cm and normal appearing. Normal appearing bilateral adnexa. No intra-abdominal or pelvic evidence of disease.   Estimated Blood Loss:  75cc for my portion of the surgery      Total IV Fluids: see I&O flowsheet         Specimens: bilateral tubes and ovaries, pelvic washings         Complications:  None apparent; patient tolerated the procedure well.         Disposition: PACU - hemodynamically stable.  Procedure Details  The patient was seen in the Holding Room. The risks, benefits, complications, treatment options, and expected outcomes were discussed with the patient.  The patient concurred with the proposed plan, giving informed consent.  The site of surgery properly noted/marked. The patient was identified as [name] and the procedure verified as a Robotic-assisted bilateral salpingo-oophorectomy with any other indicated procedures.   After induction of anesthesia, the patient was draped and prepped in the usual sterile manner. Patient was placed in supine position after anesthesia and draped and prepped in the usual sterile manner as follows: Her arms were tucked to her side with all appropriate precautions.  The shoulders  were stabilized with padded shoulder blocks applied to the acromium processes.  The patient was placed in the semi-lithotomy position in [place].  The perineum and vagina were prepped with CholoraPrep. The patient was draped after the CholoraPrep had been allowed to dry for 3 minutes.  A Time Out was held and the above information confirmed.  The urethra was prepped with Betadine. Foley catheter was placed.   A sterile speculum was placed in the vagina.  The cervix was grasped with a single-tooth tenaculum. OG tube placement was confirmed and to suction.   Next, a 5 mm skin incision was made 1 cm below the subcostal margin in the midclavicular line.  The 5 mm Optiview port and scope was used for direct entry.  Opening pressure was under 10 mm CO2.  The abdomen was insufflated and the findings were noted as above.   At this point and all points during the procedure, the patient's intra-abdominal pressure did not exceed 15 mmHg. Next, right and left 8 mm ports were placed about 8 cm lateral to the midline. All ports were placed under direct visualization.  The patient was placed in steep Trendelenburg.  A combination of blunt dissection and monopolar electrocautery was used to reduce the umbilical hernia. A 12 mm skin incision was made within the umbilicus and a robotic port placed under direct visualization. The robot was docked in the normal manner.  The right and left peritoneum were opened parallel to the IP ligament to open the retroperitoneal spaces bilaterally. The round ligaments were preserved. The ureter was noted to be on the medial leaf of the broad ligament.  The peritoneum above the ureter was incised and stretched and the infundibulopelvic ligament  was skeletonized, cauterized and cut.  The utero-ovarian ligament and fallopian tube were skeletonized, cauterized and transected just lateral to the uterine fundus, freeing the adnexa. The adnexa were placed in an endocatch bag and removed  though the 12 mm trocar.  Irrigation was used and excellent hemostasis was achieved.  At this point in the procedure, my portion had been completed.  Robotic instruments were removed under direct visulaization.  The robot was undocked. The surgery was turned over to [name].  The tenaculum was removed from the cervix.  Jeral Pinch, MD

## 2021-02-26 NOTE — Op Note (Signed)
Preoperative diagnosis: BRCA mutation, incisional umbilical hernia, no longer needs venous access Postoperative diagnosis: Same as above Procedure: 1.  Laparoscopic incisional 3 cm hernia repair with intraperitoneal 15 cm round Ventralight mesh 2.  Port removal Surgeon: Dr. Serita Grammes Anesthesia: General Estimated blood loss: Minimal Specimens: None Drains: None Complications: None Special count was correct at the end of my procedure Disposition to recovery in stable condition  Indications:51 year old female who had a left breast cancer taking care of by one of my partners. She then over the next year was found to have a BRCA2 mutation. This initially was a VUS but then was reclassified as likely pathogenic. She underwent an MRI at that point which showed a breast cancer on the same breast that she recently had had lumpectomy and radiation therapy. She had bilateral mastectomies at that point. 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 The right side had no evidence of malignancy and healed fine the left side had a pT2 invasive ductal carcinoma that showed it to be weakly estrogen receptor positive. She then underwent adjuvant chemotherapy. She had significant wound healing issues that have been treated by closure and an advancement flap as well as hyperbaric oxygen and this is finally healed.she is now on anastrozole. She is also on olapirib. She is due to have bso and possible hysterectomy with Dr Berline Lopes due to BRCA mutation. She has had a known umbilical hernia ever since the birth of one of her children. This has been stable. It was after a tubal ligation so it is an incisional hernia. She is here today to discuss repair in conjunction with the risk reducing salpingo-oophorectomy.  I discussed with her a laparoscopic repair with mesh in conjunction with a robotic bilateral salpingo-oophorectomy.  She also  desires port removal as she no longer needs venous access.  Procedure: After informed consent was obtained the patient was taken to the operating room.  She was given antibiotics.  SCDs were in place.  She first underwent the robotic salpingo-oophorectomy prior to me arriving.  Upon my arrival we did a timeout.  I used all of the ports that a previous been placed.  There were two 8 mm trocars on either side of the abdomen as well as a 11 mm trocar that was through the umbilical incisional hernia.  There was also a 5 mm trocar in the left upper quadrant.  There was a Ray-Tec that was in the pelvis that I removed.  We then did a sponge count and this was correct.  I first placed an additional 5 mm trocar in the left abdomen.  I ended up placing an additional two 5 mm trocars in the right abdomen as well.  I viewed the hernia and this was about 3 cm.  I then used a 15 cm round ventral light mesh.  I placed cardinal sutures of #1 Novafil.  I then inserted this through the umbilical incision.  I laid this flat.  I then used the suture closure device to close the hernia with #1 Novafil suture.  Following this I placed the mesh up against the abdominal wall.  I measured this out and used the suture passer device to pull the mesh up into apposition with the abdominal wall.  This was done at all 4 locations.  I then used the secure strap tacker to adhere the mesh to the abdominal wall.  This was  in good position.  I did have to remove a small piece of fat at the umbilicus to get this to lie flat before this.  I did take down some of the falciform ligament as well.  The mesh appeared to be in good position.  There was good hemostasis.  I then removed all the trocars and desufflated the abdomen.  I closed all of the trocar sites with 4 Monocryl, glue, and Steri-Strips.  We then moved to the port removal.  I prepped and draped the site.  We then did a surgical timeout. I then infiltrated Marcaine overlying the port.  I  then reentered her old incision.  I remove the port, line, suture material in their entirety.  Hemostasis was observed.  I closed this with 3-0 Vicryl and 4-0 Monocryl.  Glue and Steri-Strips were applied.  She tolerated this well was extubated and transferred to the recovery room stable.

## 2021-02-26 NOTE — Interval H&P Note (Signed)
History and Physical Interval Note:  02/26/2021 7:13 AM  [name]  has presented today for surgery, with the diagnosis of BRCA2 HX of Breast Ca.  The various methods of treatment have been discussed with the patient and family. After consideration of risks, benefits and other options for treatment, the patient has consented to  Procedure(s): XI ROBOTIC ASSISTED BILATERAL SALPINGO OOPHORECTOMY (N/A) LAPAROSCOPIC INCISIONAL HERNIA WITH MESH (N/A) REMOVAL PORT-A-CATH (N/A) as a surgical intervention.  The patient's history has been reviewed, patient examined, no change in status, stable for surgery.  I have reviewed the patient's chart and labs.  Questions were answered to the patient's satisfaction.     Lafonda Mosses

## 2021-02-26 NOTE — H&P (Signed)
Gynecologic Oncology Return Clinic Visit  02/26/21  Treatment History: Oncology History  Malignant neoplasm of upper-outer quadrant of left breast in female, estrogen receptor negative ([place])  06/19/2016 Initial Diagnosis   status post left breast upper outer quadrant and left axillary lymph node biopsy, both positive for a clinical T2-T3 N1, stage IIIB-C invasive ductal carcinoma, grade 3, triple negative, with an MIB-1 of 90%   07/20/2016 - 12/21/2016 Neo-Adjuvant Chemotherapy   neoadjuvant chemotherapy to consist of doxorubicin and cyclophosphamide in dose dense fashion 4 completed 09/10/2016 to be followed by paclitaxel weekly 12 given with carboplatin              (a) cycle 4 of cyclophosphamide and doxorubicin was delayed 10 days and dose decreased 10% because of febrile neutropenia after cycle 3             (b) cycle 6 of Paclitaxel and Carboplatin delayed due to neutropenia, therefore Granix added to Wednesday, Thursday, Friday following chemotherapy days   08/17/2016 Genetic Testing   BRCA2 c.8169T>A and MSH2 c.2050G>T VUS found on the common hereditary cancer panel.  The Hereditary Gene Panel offered by [facility] includes sequencing and/or deletion duplication testing of the following 46 genes: APC, ATM, AXIN2, BARD1, BMPR1A, BRCA1, BRCA2, BRIP1, CDH1, CDKN2A (p14ARF), CDKN2A (p16INK4a), CHEK2, CTNNA1, DICER1, EPCAM (Deletion/duplication testing only), GREM1 (promoter region deletion/duplication testing only), KIT, MEN1, MLH1, MSH2, MSH3, MSH6, MUTYH, NBN, NF1, NHTL1, PALB2, PDGFRA, PMS2, POLD1, POLE, PTEN, RAD50, RAD51C, RAD51D, SDHB, SDHC, SDHD, SMAD4, SMARCA4. STK11, TP53, TSC1, TSC2, and VHL.  The following genes were evaluated for sequence changes only: SDHA and HOXB13 c.251G>A variant only.  The report date is August 17, 2016.    01/27/2017 Surgery   status post left lumpectomy and left axillary sentinel lymph node sampling with pathology showing a complete pathologic response (ypT0  ypN0); 5 left axillary lymph nodes removed      03/11/2017 - 04/21/2017 Radiation Therapy   adjuvant radiation 03/11/2017-04/21/2017  Site/dose:   1. Left breast, 2 Gy in 25 fractions for a total dose of 50 Gy                      2. Boost, 2 Gy in 5 fractions for a total dose of 10 Gy     02/19/2020 -  Chemotherapy    Patient is on Treatment Plan: BREAST ADJUVANT CMF IV Q21D       Recurrent breast cancer, left ([place])  12/16/2019 Initial Diagnosis   Recurrent breast cancer, left ([place])   02/19/2020 -  Chemotherapy    Patient is on Treatment Plan: BREAST ADJUVANT CMF IV Q21D       02/19/2020 Cancer Staging   Staging form: Breast, AJCC 8th Edition - Clinical: Stage Unknown (cT2, cNX, cM0, G2, ER+, PR-, HER2: Equivocal) - Signed by Chauncey Cruel, MD on 02/19/2020      Interval History: Doing well. No pelvic pain. Denies bleeding or discharge. Reports normal bowel function. Lonie Peak stopped at end of last week.  Past Medical/Surgical History: Past Medical History:  Diagnosis Date   Arthritis    Breast cancer ([place]) 17/71/1657   left    Complication of anesthesia    DAY SURGERY 2010 OR 2011 ASPIRATED AND STAYED OVERNIGHT   History of radiation therapy 03/11/17- 04/21/17   Left breast, 2 Gy in 25 fractions for a total dose of 50 Gy, Boost, 2 Gy in 5 fractions for a total dose of 10 Gy   Hypertension  OFF MEDS  SINCE CHEMO X 5 MONTHS   Malignant neoplasm of upper-outer quadrant of left breast in female, estrogen receptor negative ([place]) 06/23/2016    Past Surgical History:  Procedure Laterality Date   ANKLE SURGERY Right    BREAST CYST EXCISION Right 07/15/2020   Procedure: Excision excess right axillary tissue;  Surgeon: Cindra Presume, MD;  Location: [facility];  Service: Plastics;  Laterality: Right;   BREAST LUMPECTOMY Left 01/27/2017   BREAST LUMPECTOMY WITH RADIOACTIVE SEED AND SENTINEL LYMPH NODE BIOPSY Left 01/27/2017   Procedure: LEFT BREAST LUMPECTOMY WITH RADIOACTIVE  SEED LOCALIZATION, LEFT AXILLARY DEEP LYMPH NODE BIOPSY WITH RADIOACTIVE SEED LOCALIZATION, LEFT AXILLARY SENTINEL LYMPH NODE MAPPING AND BIOPSY WITH BLUE DYE INJECTION;  Surgeon: [name], MD;  Location: [facility];  Service: General;  Laterality: Left;   COLONOSCOPY WITH PROPOFOL N/A 02/01/2020   Procedure: COLONOSCOPY WITH PROPOFOL;  Surgeon: Milus Banister, MD;  Location: [facility];  Service: Endoscopy;  Laterality: N/A;   DEBRIDEMENT AND CLOSURE WOUND Left 03/28/2020   Procedure: DEBRIDEMENT AND CLOSURE OF MASTECTOMY WOUND;  Surgeon: Rolm Bookbinder, MD;  Location: [facility];  Service: General;  Laterality: Left;   DEBRIDEMENT AND CLOSURE WOUND Left 07/15/2020   Procedure: Debridement of left chest wound and rotational flap closure;  Surgeon: Cindra Presume, MD;  Location: [facility];  Service: Plastics;  Laterality: Left;  1.5 hour   ENDOSCOPIC MUCOSAL RESECTION  02/01/2020   Procedure: ENDOSCOPIC MUCOSAL RESECTION;  Surgeon: Milus Banister, MD;  Location[facility];  Service: Endoscopy;;   HEMOSTASIS CLIP PLACEMENT  02/01/2020   Procedure: HEMOSTASIS CLIP PLACEMENT;  Surgeon: Milus Banister, MD;  Location: [facility];  Service: Endoscopy;;   IR FLUORO GUIDE PORT INSERTION RIGHT  07/14/2016   IR US GUIDE VASC ACCESS RIGHT  07/14/2016   MASTECTOMY W/ SENTINEL NODE BIOPSY Bilateral 01/09/2020   Procedure: BILATERAL MASTECTOMY WITH LEFT AXILLARY SENTINEL LYMPH NODE BIOPSY;  Surgeon: Rolm Bookbinder, MD;  Location: [facility];  Service: General;  Laterality: Bilateral;   PORT-A-CATH REMOVAL Right 01/27/2017   Procedure: REMOVAL PORT-A-CATH;  Surgeon: [name], MD;  Location: [facility];  Service: General;  Laterality: Right;   PORTACATH PLACEMENT Right 01/09/2020   Procedure: INSERTION PORT-A-CATH;  Surgeon: Rolm Bookbinder, MD;  Location: [facility];  Service: General;  Laterality: Right;   SUBMUCOSAL LIFTING INJECTION  02/01/2020   Procedure:  SUBMUCOSAL LIFTING INJECTION;  Surgeon: Milus Banister, MD;  Location: [facility];  Service: Endoscopy;;   TUBAL LIGATION     WISDOM TOOTH EXTRACTION      Family History  Problem Relation Age of Onset   Diabetes Mother    Hypertension Mother    Arthritis Mother    Colon cancer Father 32       died .5 metastatic at time of diagnosis   Diabetes Father    Heart disease Father    Hypertension Father    Breast cancer Maternal Grandmother 37       d.50s   Breast cancer Maternal Aunt 52   Cervical cancer Maternal Aunt 55   Breast cancer Maternal Aunt 46       d.50s   Cancer Maternal Uncle        d.62s unspecified type of cancer   Breast cancer Cousin 89       paternal first-cousin (daughter of unaffected aunt)   Cancer Maternal Aunt 60       "Female Cancer"   Cancer Maternal  Aunt        unknown cancer   Brain cancer Daughter 57   Esophageal cancer Other    Colon polyps Neg Hx    Rectal cancer Neg Hx    Stomach cancer Neg Hx     Social History   Socioeconomic History   Marital status: Married    Spouse name: Not on file   Number of children: Not on file   Years of education: Not on file   Highest education level: Not on file  Occupational History   Not on file  Tobacco Use   Smoking status: Never   Smokeless tobacco: Never  Vaping Use   Vaping Use: Never used  Substance and Sexual Activity   Alcohol use: Yes    Comment: rare   Drug use: No   Sexual activity: Not Currently  Other Topics Concern   Not on file  Social History Narrative   Not on file   Social Determinants of Health   Financial Resource Strain: Not on file  Food Insecurity: Not on file  Transportation Needs: Not on file  Physical Activity: Not on file  Stress: Not on file  Social Connections: Not on file    Current Medications:  Current Facility-Administered Medications:    ceFAZolin (ANCEF) IVPB 2g/100 mL premix, 2 g, Intravenous, On Call to OR, Rolm Bookbinder, MD   6 CHG cloth  bath night before surgery, , , Once **AND** [START ON 02/27/2021] 6 CHG cloth bath AM of surgery, , , Once **AND** Chlorhexidine Gluconate Cloth 2 % PADS 6 each, 6 each, Topical, Once **AND** Chlorhexidine Gluconate Cloth 2 % PADS 6 each, 6 each, Topical, Once, Rolm Bookbinder, MD   dexamethasone (DECADRON) injection 4 mg, 4 mg, Intravenous, On Call to OR, Cross, Melissa D, NP   [START ON 02/27/2021] feeding supplement (ENSURE PRE-SURGERY) liquid 296 mL, 296 mL, Oral, Once, Rolm Bookbinder, MD   lactated ringers infusion, , Intravenous, Continuous, Suzette Battiest, MD, Last Rate: 10 mL/hr at 02/26/21 0612, New Bag at 02/26/21 0612   scopolamine (TRANSDERM-SCOP) 1 MG/3DAYS 1.5 mg, 1 patch, Transdermal, On Call to OR, Cross, Melissa D, NP, 1.5 mg at 02/26/21 0601  Review of Systems: Denies appetite changes, fevers, chills, fatigue, unexplained weight changes. Denies hearing loss, neck lumps or masses, mouth sores, ringing in ears or voice changes. Denies cough or wheezing.  Denies shortness of breath. Denies chest pain or palpitations. Denies leg swelling. Denies abdominal distention, pain, blood in stools, constipation, diarrhea, nausea, vomiting, or early satiety. Denies pain with intercourse, dysuria, frequency, hematuria or incontinence. Denies hot flashes, pelvic pain, vaginal bleeding or vaginal discharge.   Denies joint pain, back pain or muscle pain/cramps. Denies itching, rash, or wounds. Denies dizziness, headaches, numbness or seizures. Denies swollen lymph nodes or glands, denies easy bruising or bleeding. Denies anxiety, depression, confusion, or decreased concentration.  Physical Exam: BP 136/85    Pulse 90    Temp 98.1 F (36.7 C) (Oral)    Resp 17    Ht 5' 7.5" (1.715 m)    Wt 249 lb 9 oz (113.2 kg)    LMP  (LMP Unknown)    SpO2 99%    BMI 38.51 kg/m  General: Alert, oriented, no acute distress. HEENT: Normocephalic, atraumatic, sclera anicteric. Chest: Clear to  auscultation bilaterally.  No wheezes or rhonchi. Cardiovascular: Regular rate and rhythm, no murmurs. Abdomen: Obese, soft, nontender.  Normoactive bowel sounds.  No masses or hepatosplenomegaly appreciated.  Palpable several centimeter sized  umbilical hernia. Extremities: Grossly normal range of motion.  Warm, well perfused.  No edema bilaterally.  Laboratory & Radiologic Studies: 12/12/20: Pelvic ultrasound IMPRESSION: 1. Normal ultrasound appearance of the uterus. 2. Nonvisualized ovaries  Assessment & Plan: JUDENE LOGUE is a 51 y.o. woman with BRCA2 mutation and personal history of breast cancer x2.  Plan for RRBSO today. Discussed small but present risk of cancer found during risk-reducing surgery. Plan would be for frozen section and any staging procedures indicated if needed.  Jeral Pinch, MD  Division of Gynecologic Oncology  Department of Obstetrics and Gynecology  [facility]

## 2021-02-26 NOTE — Anesthesia Procedure Notes (Signed)
Procedure Name: Intubation Date/Time: 02/26/2021 8:23 AM Performed by: Niel Hummer, CRNA Pre-anesthesia Checklist: Patient identified, Emergency Drugs available, Suction available and Patient being monitored Patient Re-evaluated:Patient Re-evaluated prior to induction Oxygen Delivery Method: Circle system utilized Preoxygenation: Pre-oxygenation with 100% oxygen Induction Type: IV induction Ventilation: Mask ventilation without difficulty Laryngoscope Size: Mac and 4 Grade View: Grade I Tube type: Oral Tube size: 7.0 mm Number of attempts: 2 Airway Equipment and Method: Stylet Placement Confirmation: positive ETCO2, breath sounds checked- equal and bilateral and ETT inserted through vocal cords under direct vision Secured at: 22 cm Tube secured with: Tape Dental Injury: Teeth and Oropharynx as per pre-operative assessment  Comments: DL by CRNA MAC 4, grade 1 view. Tube placed in esophagus. Removed tube, DL again, same view, passed tube easily.

## 2021-02-26 NOTE — H&P (Signed)
51 year old female who had a left breast cancer taking care of by one of my partners. She then over the next year was found to have a BRCA2 mutation. This initially was a VUS but then was reclassified as likely pathogenic. She underwent an MRI at that point which showed a breast cancer on the same breast that she recently had had lumpectomy and radiation therapy. She had bilateral mastectomies at that point. 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 The right side had no evidence of malignancy and healed fine the left side had a pT2 invasive ductal carcinoma that showed it to be weakly estrogen receptor positive. She then underwent adjuvant chemotherapy. She had significant wound healing issues that have been treated by closure and an advancement flap as well as hyperbaric oxygen and this is finally healed.she is now on anastrozole. She is also on olapirib. She is due to have bso and possible hysterectomy with Dr Berline Lopes due to BRCA mutation. She has had a known umbilical hernia ever since the birth of one of her children. This has been stable. It was after a tubal ligation so it is an incisional hernia. She is here today to discuss repair in conjunction with the risk reducing salpingo-oophorectomy.  Review of Systems: A complete review of systems was obtained from the patient. I have reviewed this information and discussed as appropriate with the patient. See HPI as well for other ROS.  Review of Systems  All other systems reviewed and are negative.   Medical History: Past Medical History:  Diagnosis Date   History of cancer  Past Surgical History:  Procedure Laterality Date   MASTECTOMY   No Known Allergies  Current Outpatient Medications on File Prior to Visit  Medication Sig Dispense Refill   gabapentin (NEURONTIN) 300 MG capsule Take by mouth   traMADoL (ULTRAM) 50 mg tablet Take by mouth   LYNPARZA 150  mg Tab tablet   Family History  Problem Relation Age of Onset   Diabetes Mother   High blood pressure (Hypertension) Father   Diabetes Father   Colon cancer Father    Social History   Tobacco Use  Smoking Status Never  Smokeless Tobacco Never    Social History   Socioeconomic History   Marital status: Married  Tobacco Use   Smoking status: Never   Smokeless tobacco: Never  Substance and Sexual Activity   Alcohol use: Never   Drug use: Never   Objective:   Body mass index is 39.66 kg/m.  Physical Exam Constitutional:  Appearance: Normal appearance.  Neurological:  Mental Status: She is alert.   Right mastectomy well-healed without any mass, left mastectomy is now healed without any mass or anything concerning Abdomen is soft and nontender, obese, there is a very small umbilical hernia that I can detect on my exam today. This is not tender.  Assessment and Plan:   Incisional hernia of anterior abdominal wall without obstruction or gangrene  This incisional hernia has been there for some time. Is not really changed. It was present on a CT scan in 2021 as well. She certainly is at higher risk due to her body habitus and if it were just the hernia alone I probably would wait. She is having a gynecologic procedure that will be done robotically. I think it is reasonable to fix the hernia at the same time. I discussed a laparoscopic repair  of this hernia with mesh at the same time as that procedure. We discussed the risks including recurrence, injury to surrounding structures requiring repair. I think she is likely going to stay overnight from my portion of the procedure as well. We will work with Dr. Charisse March office to get her scheduled.

## 2021-02-26 NOTE — Telephone Encounter (Signed)
Sch per 2/20 inbasket, left msg for pt. °

## 2021-02-27 ENCOUNTER — Encounter: Payer: Self-pay | Admitting: Oncology

## 2021-02-27 ENCOUNTER — Other Ambulatory Visit (HOSPITAL_COMMUNITY): Payer: Self-pay

## 2021-02-27 ENCOUNTER — Encounter (HOSPITAL_COMMUNITY): Payer: Self-pay | Admitting: Gynecologic Oncology

## 2021-02-27 DIAGNOSIS — K429 Umbilical hernia without obstruction or gangrene: Secondary | ICD-10-CM | POA: Diagnosis not present

## 2021-02-27 MED ORDER — OXYCODONE HCL 5 MG PO TABS
5.0000 mg | ORAL_TABLET | ORAL | 0 refills | Status: DC | PRN
  Filled 2021-02-27: qty 15, 3d supply, fill #0

## 2021-02-27 MED ORDER — METHOCARBAMOL 500 MG PO TABS
500.0000 mg | ORAL_TABLET | Freq: Four times a day (QID) | ORAL | 1 refills | Status: DC | PRN
Start: 2021-02-27 — End: 2021-03-24
  Filled 2021-02-27: qty 20, 5d supply, fill #0

## 2021-02-27 NOTE — Discharge Instructions (Signed)
[facility]  POST OP INSTRUCTIONS  A  prescription for pain medication may be given to you upon discharge.  Take your pain medication as prescribed, if needed.  If narcotic pain medicine is not needed, then you may take acetaminophen (Tylenol), naprosyn (Alleve) or ibuprofen (Advil) as needed. You may take 800 mg ibuprofen every 8 hours or 650 mg of tylenol every 6 hours for next 72 hours to keep a good level of pain control Ice several times a day for 10 minutes will also help. Take your usually prescribed medications unless otherwise directed. If you need a refill on your pain medication, please contact your pharmacy.  They will contact our office to request authorization. Prescriptions will not be filled after 5 pm or on week-ends. You should follow a light diet the first 24 hours after arrival home, such as soup and crackers, etc.  Be sure to include lots of fluids daily.  Resume your normal diet the day after surgery. Most patients will experience some swelling and bruising at surgical sites.  Ice packs and reclining will help.  Swelling and bruising can take several days to resolve.  It is common to experience some constipation if taking pain medication after surgery.  Increasing fluid intake and taking a stool softener (such as Colace) will usually help or prevent this problem from occurring.  A mild laxative (Milk of Magnesia or Miralax) should be taken according to package directions if there are no bowel movements after 48 hours. I used skin glue on the incisions, you may shower 24 hours after surgery.  The glue will flake off over the next 2-3 weeks.   ACTIVITIES:  You may resume regular (light) daily activities beginning the next day--such as daily self-care, walking, climbing stairs--gradually increasing activities as tolerated.  You may have sexual intercourse when it is comfortable.  Refrain from any heavy lifting or straining until approved by your doctor. This is  over 10 pounds.  You may drive when you are no longer taking prescription pain medication, you can comfortably wear a seatbelt, and you can safely maneuver your car and apply brakes. RETURN TO WORK:  __________________________________________________________ Dennis Bast should see your doctor in the office for a follow-up appointment approximately 2-3 weeks after your surgery.  Make sure that you call for this appointment within a day or two after you arrive home to insure a convenient appointment time. OTHER INSTRUCTIONS:  __________________________________________________________________________________________________________________________________________________________________________________________  WHEN TO CALL YOUR DOCTOR: Fever over 101.0 Inability to urinate Nausea and/or vomiting Extreme swelling or bruising Continued bleeding from incision. Increased pain, redness, or drainage from the incision  The clinic staff is available to answer your questions during regular business hours.  Please dont hesitate to call and ask to speak to one of the nurses for clinical concerns.  If you have a medical emergency, go to the nearest emergency room or call 911.  A surgeon from [facility] is always on call at the hospital   [address] [place], [place], [place]  [address] ?  [address], [place], [place]   [address] [phone] ? [phone] ? FAX [phone] Web site: [email or web address]

## 2021-02-27 NOTE — Progress Notes (Signed)
Discharge instructions given to patient and all questions were answered.  

## 2021-02-27 NOTE — Discharge Summary (Signed)
Physician Discharge Summary  Patient ID: [name] MRN: [id number] DOB/AGE: May 30, 1970 51 y.o.  Admit date: 02/26/2021 Discharge date: 02/27/2021  Admission Diagnoses: Breast cancer BRCA mutation Incisional hernia No longer needs venous access  Discharge Diagnoses:  Principal Problem:   Incisional hernia   Discharged Condition: good  Hospital Course:50 yof I know well from breast cancer treatment.  She has brca mutation.  Underwent robotic bso and then fixed a preexisting incisional hernia with mesh laparoscopically. I also removed port. She is doing well with good pain control, tolerating diet the next morning. Plan for discharge  Consults: None  Significant Diagnostic Studies: none  Treatments: surgery: robotic bso, port removal, lap incisional hernia repair with mesh  Discharge Exam: Blood pressure 101/75, pulse 68, temperature 97.8 F (36.6 C), temperature source Oral, resp. rate 18, height 5' 7.5" (1.715 m), weight 113.2 kg, SpO2 99 %. Port site without hematoma abdomen soft, approp tender incisions clean  Disposition: Discharge disposition: 01-Home or Self Care        Allergies as of 02/27/2021       Reactions   Carboplatin Other (See Comments)   Red, blotchy, hot        Medication List     TAKE these medications    docusate sodium 100 MG capsule Commonly known as: COLACE Take 100 mg by mouth daily.   gabapentin 100 MG capsule Commonly known as: NEURONTIN TAKE 2 CAPSULES BY MOUTH IN THE MORNING AND 2 CAPSULES IN THE AFTERNOON What changed:  how much to take how to take this when to take this reasons to take this   gabapentin 300 MG capsule Commonly known as: NEURONTIN Take 3 capsules (900 mg total) by mouth at bedtime. What changed: how much to take   ibuprofen 800 MG tablet Commonly known as: ADVIL Take 1 tablet by mouth every 8 hours as needed for moderate pain AFTER surgery only   Lynparza 150 MG tablet Generic drug:  olaparib Take 2 tablets (300 mg total) by mouth 2 (two) times daily. Swallow whole. May take with food to decrease nausea and vomiting.Start May 23, 2020   methocarbamol 500 MG tablet Commonly known as: ROBAXIN Take 1 tablet (500 mg total) by mouth every 6 (six) hours as needed for muscle spasms.   ondansetron 8 MG disintegrating tablet Commonly known as: ZOFRAN-ODT Take 1 tablet (8 mg total) by mouth every 8 (eight) hours as needed for vomiting or nausea. 64m ODT q4 hours prn nausea   oxyCODONE 5 MG immediate release tablet Commonly known as: Oxy IR/ROXICODONE Take 1 tablet (5 mg total) by mouth every 4 (four) hours as needed for moderate pain.   potassium chloride SA 20 MEQ tablet Commonly known as: KLOR-CON M TAKE 1 TABLET BY MOUTH ONCE A DAY   traMADol 50 MG tablet Commonly known as: ULTRAM TAKE 1 TO 2 TABLETS BY MOUTH EVERY 6 HOURS AS NEEDED.        Follow-up Information     WRolm Bookbinder MD Follow up in 3 week(s).   Specialty: General Surgery Contact information: [phone]                 Signed: MRolm Bookbinder2/23/2023, 8:19 AM

## 2021-02-27 NOTE — Progress Notes (Signed)
Transition of Care (TOC) Screening Note  Patient Details  Name: BRYTNEE BECHLER Date of Birth: 08-Aug-1970  Transition of Care [facility]) CM/SW Contact:    Sherie Don, LCSW Phone Number: 02/27/2021, 8:58 AM  Transition of Care Department [facility]) has reviewed patient and no TOC needs have been identified at this time. We will continue to monitor patient advancement through interdisciplinary progression rounds. If new patient transition needs arise, please place a TOC consult.

## 2021-02-28 ENCOUNTER — Other Ambulatory Visit (HOSPITAL_COMMUNITY): Payer: Self-pay

## 2021-02-28 ENCOUNTER — Telehealth: Payer: Self-pay

## 2021-02-28 ENCOUNTER — Encounter: Payer: Self-pay | Admitting: Oncology

## 2021-02-28 LAB — CYTOLOGY - NON PAP

## 2021-02-28 MED ORDER — PROAIR RESPICLICK 108 (90 BASE) MCG/ACT IN AEPB
INHALATION_SPRAY | RESPIRATORY_TRACT | 0 refills | Status: DC
  Filled 2021-02-28: qty 1, 25d supply, fill #0
  Filled 2021-02-28: qty 18, 30d supply, fill #0

## 2021-02-28 MED ORDER — ALBUTEROL SULFATE HFA 108 (90 BASE) MCG/ACT IN AERS
INHALATION_SPRAY | RESPIRATORY_TRACT | 0 refills | Status: DC
  Filled 2021-02-28: qty 18, 16d supply, fill #0

## 2021-02-28 NOTE — Telephone Encounter (Signed)
Spoke with Ms. Lepp  this morning. She states she is eating, drinking and urinating well. She has not had a BM yet but is passing gas. She will begin senokot-s 2 tabs bid with 8 oz of water. Encouraged her to drink plenty of water. She denies fever or chills. Incisions are dry and intact. She rates her pain 5/10. Her pain is controlled with ibuprofen, tylenol, and Tramadol.  Instructed to call office with any fever, chills, purulent drainage, uncontrolled pain or any other questions or concerns. Patient verbalizes understanding.   Pt aware of post op appointments as well as the office number [phone] and after hours number [phone] to call if she has any questions or concerns

## 2021-02-28 NOTE — Telephone Encounter (Signed)
Transition Care Management Follow-up Telephone Call Date of discharge and from where: 02/27/2021, [facility]  How have you been since you were released from the hospital? She said that overall she is doing good. Her abdomen is quite sore from the surgery. She also reported that her throat has been scratchy and she has some wheezing. She called Dr Cristal Generous office about the wheezing and was waiting for a call back and he called her back while we were talking. I instructed her to take his call and to call this clinic back if she has any questions.  Any questions or concerns? Yes - noted above. She explained that she wants to see Dr Wynetta Emery again.  She was focused on her cancer and wound care for the past few years and understands she needs to re-establish care again with her PCP.   Items Reviewed: Did the pt receive and understand the discharge instructions provided? Yes  Medications obtained and verified? Yes - she said she has all of her medications and she did not have any questions about the med regime.  Other? No  Any new allergies since your discharge? No  Dietary orders reviewed? No - she said her appetite has not been very good.  Do you have support at home? Yes - her husband and daughters.   Home Care and Equipment/Supplies: Were home health services ordered? no If so, what is the name of the agency? N/a  Has the agency set up a time to come to the patient's home? not applicable Were any new equipment or medical supplies ordered?  No What is the name of the medical supply agency? N/a Were you able to get the supplies/equipment? not applicable Do you have any questions related to the use of the equipment or supplies? No  Functional Questionnaire: (I = Independent and D = Dependent) ADLs: independent but her family is providing assistance if needed  Follow up appointments reviewed:  PCP Hospital f/u appt confirmed? Yes  Scheduled to see Dr Wynetta Emery on 04/03/2021.   [facility] f/u appt confirmed? Yes  Scheduled to see Dr Berline Lopes and the oncologist - 03/24/2021 and Dr Donne Hazel - 03/25/2021.  Are transportation arrangements needed? No  If their condition worsens, is the pt aware to call PCP or go to the Emergency Dept.? Yes Was the patient provided with contact information for the PCP's office or ED? Yes Was to pt encouraged to call back with questions or concerns? Yes

## 2021-03-02 ENCOUNTER — Other Ambulatory Visit: Payer: Self-pay

## 2021-03-02 ENCOUNTER — Emergency Department (HOSPITAL_BASED_OUTPATIENT_CLINIC_OR_DEPARTMENT_OTHER): Payer: Medicaid Other

## 2021-03-02 ENCOUNTER — Encounter (HOSPITAL_BASED_OUTPATIENT_CLINIC_OR_DEPARTMENT_OTHER): Payer: Self-pay | Admitting: Emergency Medicine

## 2021-03-02 ENCOUNTER — Emergency Department (HOSPITAL_BASED_OUTPATIENT_CLINIC_OR_DEPARTMENT_OTHER)
Admission: EM | Admit: 2021-03-02 | Discharge: 2021-03-02 | Disposition: A | Discharge status: Home / Self Care | Payer: Medicaid Other | Attending: Emergency Medicine | Admitting: Emergency Medicine

## 2021-03-02 DIAGNOSIS — T8189XA Other complications of procedures, not elsewhere classified, initial encounter: Secondary | ICD-10-CM | POA: Insufficient documentation

## 2021-03-02 DIAGNOSIS — R000 Tachycardia, unspecified: Secondary | ICD-10-CM | POA: Insufficient documentation

## 2021-03-02 DIAGNOSIS — E876 Hypokalemia: Secondary | ICD-10-CM | POA: Insufficient documentation

## 2021-03-02 DIAGNOSIS — R509 Fever, unspecified: Secondary | ICD-10-CM

## 2021-03-02 LAB — URINALYSIS, ROUTINE W REFLEX MICROSCOPIC
Bilirubin Urine: NEGATIVE
Glucose, UA: NEGATIVE mg/dL
Hgb urine dipstick: NEGATIVE
Ketones, ur: NEGATIVE mg/dL
Leukocytes,Ua: NEGATIVE
Nitrite: NEGATIVE
Protein, ur: NEGATIVE mg/dL
Specific Gravity, Urine: 1.02 (ref 1.005–1.030)
pH: 5.5 (ref 5.0–8.0)

## 2021-03-02 LAB — COMPREHENSIVE METABOLIC PANEL
ALT: 13 U/L (ref 0–44)
AST: 18 U/L (ref 15–41)
Albumin: 3.2 g/dL — ABNORMAL LOW (ref 3.5–5.0)
Alkaline Phosphatase: 80 U/L (ref 38–126)
Anion gap: 7 (ref 5–15)
BUN: <5 mg/dL — ABNORMAL LOW (ref 6–20)
CO2: 29 mmol/L (ref 22–32)
Calcium: 8.2 mg/dL — ABNORMAL LOW (ref 8.9–10.3)
Chloride: 99 mmol/L (ref 98–111)
Creatinine, Ser: 0.72 mg/dL (ref 0.44–1.00)
GFR, Estimated: >60 mL/min (ref >60)
Glucose, Bld: 105 mg/dL — ABNORMAL HIGH (ref 70–99)
Potassium: 3.2 mmol/L — ABNORMAL LOW (ref 3.5–5.1)
Sodium: 135 mmol/L (ref 135–145)
Total Bilirubin: 0.8 mg/dL (ref 0.3–1.2)
Total Protein: 6.7 g/dL (ref 6.5–8.1)

## 2021-03-02 LAB — CBC WITH DIFFERENTIAL/PLATELET
Abs Immature Granulocytes: 0.01 10*3/uL (ref 0.00–0.07)
Basophils Absolute: 0 10*3/uL (ref 0.0–0.1)
Basophils Relative: 0 %
Eosinophils Absolute: 0.4 10*3/uL (ref 0.0–0.5)
Eosinophils Relative: 7 %
HCT: 33.8 % — ABNORMAL LOW (ref 36.0–46.0)
Hemoglobin: 11.1 g/dL — ABNORMAL LOW (ref 12.0–15.0)
Immature Granulocytes: 0 %
Lymphocytes Relative: 22 %
Lymphs Abs: 1.1 10*3/uL (ref 0.7–4.0)
MCH: 30.6 pg (ref 26.0–34.0)
MCHC: 32.8 g/dL (ref 30.0–36.0)
MCV: 93.1 fL (ref 80.0–100.0)
Monocytes Absolute: 0.4 10*3/uL (ref 0.1–1.0)
Monocytes Relative: 8 %
Neutro Abs: 3.2 10*3/uL (ref 1.7–7.7)
Neutrophils Relative %: 63 %
Platelets: 224 10*3/uL (ref 150–400)
RBC: 3.63 MIL/uL — ABNORMAL LOW (ref 3.87–5.11)
RDW: 14.6 % (ref 11.5–15.5)
WBC: 5.2 10*3/uL (ref 4.0–10.5)
nRBC: 0 % (ref 0.0–0.2)

## 2021-03-02 LAB — PROTIME-INR
INR: 1 (ref 0.8–1.2)
Prothrombin Time: 13.1 seconds (ref 11.4–15.2)

## 2021-03-02 LAB — LACTIC ACID, PLASMA: Lactic Acid, Venous: 1.6 mmol/L (ref 0.5–1.9)

## 2021-03-02 LAB — PREGNANCY, URINE: Preg Test, Ur: NEGATIVE

## 2021-03-02 LAB — APTT: aPTT: 31 seconds (ref 24–36)

## 2021-03-02 MED ORDER — IOHEXOL 300 MG/ML  SOLN
100.0000 mL | Freq: Once | INTRAMUSCULAR | Status: AC | PRN
  Administered 2021-03-02: 100 mL via INTRAVENOUS

## 2021-03-02 MED ORDER — LACTATED RINGERS IV BOLUS
1000.0000 mL | Freq: Once | INTRAVENOUS | Status: AC
Start: 2021-03-02 — End: 2021-03-02
  Administered 2021-03-02: 1000 mL via INTRAVENOUS

## 2021-03-02 NOTE — Progress Notes (Signed)
[facility] following for sepsis protocol. Communicated w/ ED RN/provider r/t antibiotic admin.

## 2021-03-02 NOTE — Discharge Instructions (Signed)
As we discussed your work-up here in the ER was quite reassuring.  You do have a small fluid collection at the area of your recent hernia repair it does not appear to be an abscess or infection however I do recommend that you call tomorrow morning to talk to [name] to discuss these findings and your continued symptoms.  Please use Tylenol or ibuprofen for pain/fever.  You may use 600 mg ibuprofen every 6 hours or 1000 mg of Tylenol every 6 hours.  You may choose to alternate between the 2.  This would be most effective.  Not to exceed 4 g of Tylenol within 24 hours.  Not to exceed 3200 mg ibuprofen 24 hours.   You Zofran they have at home for nausea as needed.  Please return immediately to the emergency room--specifically [facility] or [facility]--should you experience any new or concerning symptoms.

## 2021-03-02 NOTE — ED Provider Notes (Signed)
[facility] EMERGENCY DEPARTMENT Provider Note   CSN: [id number] Arrival date & time: 03/02/21  1216     History  Chief Complaint  Patient presents with   Fever    [name] is a 51 y.o. female.   Fever Surgery 4 days ago with oophorectomy and hernia repair.  [name] was general surgeon Dr. Berline Lopes was OB/GYN oncology surgeon.  She states that she recovered on postop day 1 and was discharged home that day.  She states that yesterday she developed a fever she states she has had some abdominal pain but states it is not significantly worsened since she has been home.  Took Tylenol around 10:30 AM and ibuprofen around 11:30 AM.  States that she is occasionally felt that she had a cough but has not been coughing because it causes worsening abdominal pain at her incisions.  Has not noticed any redness discharge swelling or pain at the site of incision.    Home Medications Prior to Admission medications   Medication Sig Start Date End Date Taking? Authorizing Provider  albuterol (VENTOLIN HFA) 108 (90 Base) MCG/ACT inhaler Inhale 1-2 inhalations into the lungs every 6 (six) hours as needed for Wheezing 02/28/21     Albuterol Sulfate (PROAIR RESPICLICK) 638 (90 Base) MCG/ACT AEPB Inhale into the lungs. 02/28/21     docusate sodium (COLACE) 100 MG capsule Take 100 mg by mouth daily.    [provider]  gabapentin (NEURONTIN) 100 MG capsule TAKE 2 CAPSULES BY MOUTH IN THE MORNING AND 2 CAPSULES IN THE AFTERNOON Patient taking differently: Take 100 mg by mouth 2 (two) times daily as needed (neuropathy). 09/30/20 09/30/21  Magrinat, [name], MD  gabapentin (NEURONTIN) 300 MG capsule Take 3 capsules by mouth at bedtime. 09/30/20   Magrinat, [name], MD  ibuprofen (ADVIL) 800 MG tablet Take 1 tablet by mouth every 8 hours as needed for moderate pain AFTER surgery only 02/25/21   Cross, Lenna Sciara D, NP  methocarbamol (ROBAXIN) 500 MG tablet Take 1 tablet (500 mg total) by  mouth every 6 (six) hours as needed for muscle spasms. 02/27/21   Rolm Bookbinder, MD  olaparib [facility]) 150 MG tablet Take 2 tablets (300 mg total) by mouth 2 (two) times daily. Swallow whole. May take with food to decrease nausea and vomiting.Start May 23, 2020 10/30/20   Magrinat, [name], MD  ondansetron (ZOFRAN-ODT) 8 MG disintegrating tablet Take 1 tablet (8 mg total) by mouth every 8 (eight) hours as needed for vomiting or nausea. 8mg  ODT q4 hours prn nausea 02/09/21   Molpus, John, MD  oxyCODONE (OXY IR/ROXICODONE) 5 MG immediate release tablet Take 1 tablet (5 mg total) by mouth every 4 (four) hours as needed for moderate pain. 02/27/21   Rolm Bookbinder, MD  potassium chloride SA (KLOR-CON M) 20 MEQ tablet TAKE 1 TABLET BY MOUTH ONCE A DAY 12/09/20 12/09/21  Magrinat, [name], MD  traMADol (ULTRAM) 50 MG tablet TAKE 1 TO 2 TABLETS BY MOUTH EVERY 6 HOURS AS NEEDED. 12/02/20 05/31/21  Magrinat, [name], MD  venlafaxine XR (EFFEXOR-XR) 75 MG 24 hr capsule Take 1 capsule (75 mg total) by mouth daily with breakfast. Patient not taking: Reported on 03/25/2020 02/12/20 03/28/20  Magrinat, [name], MD      Allergies    Carboplatin    Review of Systems   Review of Systems  Constitutional:  Positive for fever.   Physical Exam Updated Vital Signs BP 101/62    Pulse  91    Temp 98.6 F (37 C) (Oral)    Resp 15    Ht 5\' 7"  (1.702 m)    Wt 112.9 kg    LMP  (LMP Unknown)    SpO2 98%    BMI 39.00 kg/m  Physical Exam Vitals and nursing note reviewed.  Constitutional:      General: She is not in acute distress.    Appearance: She is obese.     Comments: Pleasant 51 year old female nontoxic-appearing.  Able answer questions appropriate follow commands.  HENT:     Head: Normocephalic and atraumatic.     Nose: Nose normal.  Eyes:     General: No scleral icterus. Cardiovascular:     Rate and Rhythm: Regular rhythm. Tachycardia present.     Pulses: Normal pulses.     Heart sounds: Normal heart  sounds.  Pulmonary:     Effort: Pulmonary effort is normal. No respiratory distress.     Breath sounds: No wheezing.  Abdominal:     Palpations: Abdomen is soft.     Tenderness: There is abdominal tenderness.     Comments: Mild tenderness to palpation with deep palpation of the abdomen.  No focal abdominal tenderness no guarding or rebound  Musculoskeletal:     Cervical back: Normal range of motion.     Right lower leg: No edema.     Left lower leg: No edema.  Skin:    General: Skin is warm and dry.     Capillary Refill: Capillary refill takes less than 2 seconds.     Comments: Multiple trocar sites there is some serosanguineous, nonpurulent fluid seeping from umbilical incision site.  No warmth or cellulitic changes over this area or focal tenderness to palpation.  Neurological:     Mental Status: She is alert. Mental status is at baseline.  Psychiatric:        Mood and Affect: Mood normal.        Behavior: Behavior normal.    ED Results / Procedures / Treatments   Labs (all labs ordered are listed, but only abnormal results are displayed) Labs Reviewed  COMPREHENSIVE METABOLIC PANEL - Abnormal; Notable for the following components:      Result Value   Potassium 3.2 (*)    Glucose, Bld 105 (*)    BUN <5 (*)    Calcium 8.2 (*)    Albumin 3.2 (*)    All other components within normal limits  CBC WITH DIFFERENTIAL/PLATELET - Abnormal; Notable for the following components:   RBC 3.63 (*)    Hemoglobin 11.1 (*)    HCT 33.8 (*)    All other components within normal limits  URINALYSIS, ROUTINE W REFLEX MICROSCOPIC - Abnormal; Notable for the following components:   APPearance CLOUDY (*)    All other components within normal limits  CULTURE, BLOOD (ROUTINE X 2)  CULTURE, BLOOD (ROUTINE X 2)  URINE CULTURE  LACTIC ACID, PLASMA  PROTIME-INR  APTT  PREGNANCY, URINE    EKG None  Radiology DG Chest 2 View  Result Date: 03/02/2021 CLINICAL DATA:  Crackles in the left  lower lung fields.  Fever EXAM: CHEST - 2 VIEW COMPARISON:  02/09/2021 FINDINGS: The heart size and mediastinal contours are within normal limits. Both lungs are clear. The visualized skeletal structures are unremarkable. IMPRESSION: No active cardiopulmonary disease. Electronically Signed   By: Elmer Picker M.D.   On: 03/02/2021 13:15   CT ABDOMEN PELVIS W CONTRAST  Result Date: 03/02/2021 CLINICAL  DATA:  Abdominal pain, recent abdominal surgery EXAM: CT ABDOMEN AND PELVIS WITH CONTRAST TECHNIQUE: Multidetector CT imaging of the abdomen and pelvis was performed using the standard protocol following bolus administration of intravenous contrast. RADIATION DOSE REDUCTION: This exam was performed according to the departmental dose-optimization program which includes automated exposure control, adjustment of the mA and/or kV according to patient size and/or use of iterative reconstruction technique. CONTRAST:  120mL OMNIPAQUE IOHEXOL 300 MG/ML  SOLN COMPARISON:  01/18/2019 FINDINGS: Lower chest: Unremarkable. Hepatobiliary: No focal abnormality is seen in the liver. There is large calcified gallbladder stone. There is no wall thickening in the gallbladder. There is no dilation of bile ducts. Pancreas: No focal abnormality is seen. Spleen: Spleen measures 14.9 cm in maximum diameter. Adrenals/Urinary Tract: Adrenals are unremarkable. There is no hydronephrosis. There are no renal or ureteral stones. Urinary bladder is not distended. Stomach/Bowel: Stomach is not distended. Small bowel loops are not dilated. Appendix is not dilated. There is no significant wall thickening in the colon. There is no pericolic stranding. Few diverticula are seen in the colon without signs of focal diverticulitis. Vascular/Lymphatic: Unremarkable. Reproductive: Uterus is unremarkable. Ovaries are not distinctly visualized. There are no dominant adnexal masses. Other: There is no ascites or pneumoperitoneum. Umbilical hernia is seen  containing fluid and fat attenuation. In the left side of the umbilical hernia, there is 2 cm focus of fat attenuation. Overall size of the hernial sac measures 6.2 x 5.5 cm. There is mild stranding in the mesentery in the anterior abdomen, possibly related to recent surgery. There are no loculated intra-fluid collections. There is mild stranding in the subcutaneous fat in the suprapubic region without any loculated fluid collections. There is stranding in the subcutaneous fat in the inferior aspect of left breast. Musculoskeletal: Degenerative changes are noted in the lumbar spine with bony spurs and encroachment of neural foramina, more so at L4-L5 level. IMPRESSION: There is no evidence of intestinal obstruction or pneumoperitoneum. There is no hydronephrosis. Appendix is not dilated. There is 6.2 x 5.5 cm umbilical hernia containing fluid density and foci of fat attenuation. Fluid in the hernial sac may be seroma related to recent surgery or early abscess. Gallbladder stone. There are no imaging signs of acute cholecystitis. Other findings as described in the body of the report. Electronically Signed   By: Elmer Picker M.D.   On: 03/02/2021 14:54    Procedures Procedures    Medications Ordered in ED Medications  lactated ringers bolus 1,000 mL (0 mLs Intravenous Stopped 03/02/21 1415)  iohexol (OMNIPAQUE) 300 MG/ML solution 100 mL (100 mLs Intravenous Contrast Given 03/02/21 1415)    ED Course/ Medical Decision Making/ A&P Clinical Course as of 03/02/21 1828  Sun Mar 02, 2021  1523 IMPRESSION: There is no evidence of intestinal obstruction or pneumoperitoneum. There is no hydronephrosis. Appendix is not dilated.   There is 6.2 x 5.5 cm umbilical hernia containing fluid density and foci of fat attenuation. Fluid in the hernial sac may be seroma related to recent surgery or early abscess.   Gallbladder stone. There are no imaging signs of acute cholecystitis.   Other findings as  described in the body of the report.   [WF]    Clinical Course User Index [WF] Tedd Sias, PA                           Medical Decision Making Amount and/or Complexity of Data Reviewed Labs: ordered.  Radiology: ordered. ECG/medicine tests: ordered.  Risk Prescription drug management.    This patient presents to the ED for concern of fever after abd surgery, this involves a number of treatment options, and is a complaint that carries with it a  risk of complications and morbidity.  The differential diagnosis includes intra-abdominal infection   Co morbidities: Discussed in HPI   Brief History:  Surgery 4 days ago with oophorectomy and hernia repair.  [name] was general surgeon Dr. Berline Lopes was OB/GYN oncology surgeon.  She states that she recovered on postop day 1 and was discharged home that day.  She states that yesterday she developed a fever she states she has had some abdominal pain but states it is not significantly worsened since she has been home.  Took Tylenol around 10:30 AM and ibuprofen around 11:30 AM.  States that she is occasionally felt that she had a cough but has not been coughing because it causes worsening abdominal pain at her incisions.  Has not noticed any redness discharge swelling or pain at the site of incision.  Overall patient is very well-appearing she is slightly tachycardic and febrile we will go ahead and initiate a code sepsis hold antibiotics and initiate only 1 L of LR.  Patient responded well with tachycardia resolution she had already received Tylenol and ibuprofen prior to the ER visit therefore did not redose.  Fever resolved tachycardia resolved lactic within normal limits no white count and work-up overall reassuring.  Cancel code sepsis.   EMR reviewed including pt PMHx, past surgical history and past visits to ER.   See HPI for more details   Lab Tests:  I ordered and independently interpreted labs.  The pertinent results  include:    Labs notable for CBC without leukocytosis or clinically significant anemia.  CMP with mild hypokalemia given LR no need for additional repletion at this time.  No anion gap.  Pregnancy test negative.  Lactic within normal limits x1.  Urinalysis bland.  Coags within normal limits   Imaging Studies:  NAD. I personally reviewed all imaging studies and no acute abnormality found. I agree with radiology interpretation.  I personally viewed all images  CT shows cholelithiasis without evidence of cholecystitis that she is not tender in the right upper abdomen she does have a umbilical hernia that has a small fluid density most likely seroma.  She is not tender in this area and a small amount of serosanguineous fluid is palpably expressed on my exam.  No purulence.  Chest x-ray without infiltrate.  Cardiac Monitoring:  The patient was maintained on a cardiac monitor.  I personally viewed and interpreted the cardiac monitored which showed an underlying rhythm of: NSR NA   Medicines ordered:  I ordered medication including LR for hydration Reevaluation of the patient after these medicines showed that the patient improved I have reviewed the patients home medicines and have made adjustments as needed   Critical Interventions:     Attending:  I discussed this case with my attending physician who cosigned this note including patient's presenting symptoms, physical exam, and planned diagnostics and interventions. Attending physician stated agreement with plan or made changes to plan which were implemented.      Reevaluation:  After the interventions noted above I re-evaluated patient and found that they have :resolved   Social Determinants of Health:  The patient's social determinants of health were a factor in the care of this patient    Problem List / ED  Course:  Fever  Now resolved.  Doubt sepsis no white count suspect that this is also most likely not a PE as she  has no chest pain or difficulty breathing no unilateral or bilateral leg swelling.  Also doubt pneumonia given chest x-ray and urinalysis not compelling for UTI.  Could be atelectasis or viral disease.  She did just recover from COVID-19 less than a month ago.  Return precautions given.  She will follow-up with surgery by calling tomorrow return precautions were given.  Patient is tolerating p.o. and feeling well at time of discharge.   Dispostion:  After consideration of the diagnostic results and the patients response to treatment, I feel that the patent would benefit from discharge with close follow-up with general surgery.  Considered admission but will hold off at this time given her well-appearing presentation and reassuring work-up.    Final Clinical Impression(s) / ED Diagnoses Final diagnoses:  Fever, unspecified fever cause    Rx / DC Orders ED Discharge Orders     None         Tedd Sias, [place] 03/02/21 1854    Blanchie Dessert, MD 03/04/21 [phone]

## 2021-03-02 NOTE — ED Triage Notes (Signed)
Pt arrives pov, ambulatory to triage, reports surgery x 5 days pta, reports fever yesterday upon waking.

## 2021-03-02 NOTE — Progress Notes (Signed)
Code Sepsis monitoring discontinued due to cancellation.  

## 2021-03-02 NOTE — ED Notes (Signed)
Patient transported to CT 

## 2021-03-03 ENCOUNTER — Telehealth: Payer: Self-pay

## 2021-03-03 LAB — URINE CULTURE: Culture: NO GROWTH

## 2021-03-03 NOTE — Telephone Encounter (Signed)
Returning call to [name]. Patient left message stating "I had surgery with Dr. Berline Lopes and [name] last Wednesday 2/22. I was discharged on Thursday 2/23. Last Saturday I developed a low grade temp and called the triage line. They instructed me to go to the ER. The ER could not find the source of the fever but mentioned a seroma around the umbilical area. I called Dr. Cristal Generous office but he is out until Thursday. My fever broke last night around 2 am and I have not had a fever today. I have not had a BM yet either."  RN called Dr. Cristal Generous office and spoke with his nurse [name]. [name] is going to reach out to [name] and will follow up with the patient, it will most likely be tomorrow. [name] states it is ok for her to take a stimulant laxative.   Spoke with [name]. She reports her fever has still not returned and she denies pain. She still has not been able to have a bowel movement but is passing gas. She reports taking senokot-s twice daily since Saturday without a BM. Recommended she try a stimulant laxative such as dulcolax and to drink plenty of fluids. "My husband is going to the store to get milk of magnesia and were going to try that." She reports eating smaller meals and increasing her fluid intake and has been up and moving. Advised [name] that [name] from Dr. Cristal Generous will call and follow up with her tomorrow. Patient verbalized understanding. Instructed to call with any needs.

## 2021-03-07 LAB — CULTURE, BLOOD (ROUTINE X 2)
Culture: NO GROWTH
Culture: NO GROWTH
Special Requests: ADEQUATE
Special Requests: ADEQUATE

## 2021-03-18 ENCOUNTER — Encounter: Payer: Self-pay | Admitting: Gynecologic Oncology

## 2021-03-19 NOTE — Progress Notes (Signed)
? ?Patient Care Team: ?[name], MD as PCP - General (Internal Medicine) ?Magrinat, [name], MD (Inactive) as Consulting Physician (Oncology) ?[name], MD as Attending Physician (Radiation Oncology) ?Gardenia Phlegm, NP as Nurse Practitioner (Hematology and Oncology) ?Rolm Bookbinder, MD as Consulting Physician (General Surgery) ?Cindra Presume, MD as Consulting Physician (Plastic Surgery) ?Mauro Kaufmann, RN as Oncology Nurse Navigator ?Rockwell Germany, RN as Oncology Nurse Navigator ?Milus Banister, MD as Attending Physician (Gastroenterology) ?Ricard Dillon, MD as Consulting Physician (Internal Medicine) ? ?DIAGNOSIS:  ?Encounter Diagnoses  ?Name Primary?  ? Recurrent breast cancer, left ([place]) Yes  ? Malignant neoplasm of upper-outer quadrant of left breast in female, estrogen receptor negative ([place])   ? ? ?SUMMARY OF ONCOLOGIC HISTORY: ?Oncology History  ?Malignant neoplasm of upper-outer quadrant of left breast in female, estrogen receptor negative ([place])  ?06/19/2016 Initial Diagnosis  ? status post left breast upper outer quadrant and left axillary lymph node biopsy, both positive for a clinical T2-T3 N1, stage IIIB-C invasive ductal carcinoma, grade 3, triple negative, with an MIB-1 of 90% ?  ?07/20/2016 - 12/21/2016 Neo-Adjuvant Chemotherapy  ? neoadjuvant chemotherapy to consist of doxorubicin and cyclophosphamide in dose dense fashion ?4 completed 09/10/2016 to be followed by paclitaxel weekly ?12 given with carboplatin  ?            (a) cycle 4 of cyclophosphamide and doxorubicin was delayed 10 days and dose decreased 10% because of febrile neutropenia after cycle 3 ?            (b) cycle 6 of Paclitaxel and Carboplatin delayed due to neutropenia, therefore Granix added to Wednesday, Thursday, Friday following chemotherapy days ?  ?08/17/2016 Genetic Testing  ? BRCA2 c.8169T>A and MSH2 c.2050G>T VUS found on the common hereditary cancer panel.  The Hereditary Gene Panel  offered by [facility] includes sequencing and/or deletion duplication testing of the following 46 genes: APC, ATM, AXIN2, BARD1, BMPR1A, BRCA1, BRCA2, BRIP1, CDH1, CDKN2A (p14ARF), CDKN2A (p16INK4a), CHEK2, CTNNA1, DICER1, EPCAM (Deletion/duplication testing only), GREM1 (promoter region deletion/duplication testing only), KIT, MEN1, MLH1, MSH2, MSH3, MSH6, MUTYH, NBN, NF1, NHTL1, PALB2, PDGFRA, PMS2, POLD1, POLE, PTEN, RAD50, RAD51C, RAD51D, SDHB, SDHC, SDHD, SMAD4, SMARCA4. STK11, TP53, TSC1, TSC2, and VHL.  The following genes were evaluated for sequence changes only: SDHA and HOXB13 c.251G>A variant only.  The report date is August 17, 2016. ? ?  ?01/27/2017 Surgery  ? status post left lumpectomy and left axillary sentinel lymph node sampling with pathology showing a complete pathologic response (ypT0 ypN0); 5 left axillary lymph nodes removed ?  ? ?  ?03/11/2017 - 04/21/2017 Radiation Therapy  ? adjuvant radiation 03/11/2017-04/21/2017  ?Site/dose:   1. Left breast, 2 Gy in 25 fractions for a total dose of 50 Gy ?                     2. Boost, 2 Gy in 5 fractions for a total dose of 10 Gy ?  ?  ?02/19/2020 -  Chemotherapy  ?  Patient is on Treatment Plan: BREAST ADJUVANT CMF IV Q21D ? ?  ? ?  ?Recurrent breast cancer, left ([place])  ?12/16/2019 Initial Diagnosis  ? Recurrent breast cancer, left ([place]) ?  ?02/19/2020 -  Chemotherapy  ?  Patient is on Treatment Plan: BREAST ADJUVANT CMF IV Q21D ? ?  ? ?  ?02/19/2020 Cancer Staging  ? Staging form: Breast, AJCC 8th Edition ?- Clinical: Stage Unknown (cT2, cNX, cM0, G2, ER+,  PR-, HER2: Equivocal) - Signed by Chauncey Cruel, MD on 02/19/2020 ? ?  ? ? ?CHIEF COMPLIANT: Triple negative breast cancer; BRCA2 positive ? ?INTERVAL HISTORY: [name] is a 51 yo with the above mention Triple negative breast cancer; BRCA2 positive. She presents to the clinic today for a follow-up.  She recently underwent a bilateral salpingo-oophorectomy 3 weeks ago.  She is healing and recovering  from that.  She is currently on letrozole and neuropathy.  She is tolerating both of them extremely well.  Denies any nausea vomiting diarrhea or constipation. ? ? ?ALLERGIES:  is allergic to carboplatin. ? ?MEDICATIONS:  ?Current Outpatient Medications  ?Medication Sig Dispense Refill  ? albuterol (VENTOLIN HFA) 108 (90 Base) MCG/ACT inhaler Inhale 1-2 inhalations into the lungs every 6 (six) hours as needed for Wheezing 18 g 0  ? Albuterol Sulfate (PROAIR RESPICLICK) 891 (90 Base) MCG/ACT AEPB Inhale into the lungs. 1 each 0  ? docusate sodium (COLACE) 100 MG capsule Take 100 mg by mouth daily.    ? gabapentin (NEURONTIN) 100 MG capsule TAKE 2 CAPSULES BY MOUTH IN THE MORNING AND 2 CAPSULES IN THE AFTERNOON (Patient taking differently: Take 100 mg by mouth 2 (two) times daily as needed (neuropathy).) 240 capsule 3  ? gabapentin (NEURONTIN) 300 MG capsule Take 3 capsules by mouth at bedtime. 180 capsule 4  ? ibuprofen (ADVIL) 800 MG tablet Take 1 tablet by mouth every 8 hours as needed for moderate pain AFTER surgery only 30 tablet 0  ? methocarbamol (ROBAXIN) 500 MG tablet Take 1 tablet (500 mg total) by mouth every 6 (six) hours as needed for muscle spasms. 20 tablet 1  ? olaparib (LYNPARZA) 150 MG tablet Take 2 tablets (300 mg total) by mouth 2 (two) times daily. Swallow whole. May take with food to decrease nausea and vomiting.Start May 23, 2020 120 tablet 6  ? ondansetron (ZOFRAN-ODT) 8 MG disintegrating tablet Take 1 tablet (8 mg total) by mouth every 8 (eight) hours as needed for vomiting or nausea. 28m ODT q4 hours prn nausea 10 tablet 0  ? oxyCODONE (OXY IR/ROXICODONE) 5 MG immediate release tablet Take 1 tablet (5 mg total) by mouth every 4 (four) hours as needed for moderate pain. 15 tablet 0  ? potassium chloride SA (KLOR-CON M) 20 MEQ tablet TAKE 1 TABLET BY MOUTH ONCE A DAY 30 tablet 2  ? traMADol (ULTRAM) 50 MG tablet TAKE 1 TO 2 TABLETS BY MOUTH EVERY 6 HOURS AS NEEDED. 120 tablet 0  ? ?No current  facility-administered medications for this visit.  ? ? ?PHYSICAL EXAMINATION: ?ECOG PERFORMANCE STATUS: 1 - Symptomatic but completely ambulatory ? ?Vitals:  ? 03/24/21 1501  ?BP: 127/79  ?Pulse: 72  ?Resp: 18  ?Temp: 97.7 ?F (36.5 ?C)  ?SpO2: 98%  ? ?Filed Weights  ? 03/24/21 1501  ?Weight: 259 lb 1.6 oz (117.5 kg)  ? ? ?BREAST: Bilateral mastectomies.  Chest wall scars without any skin breakdown.  (exam performed in the presence of a chaperone) ? ?LABORATORY DATA:  ?I have reviewed the data as listed ?CMP Latest Ref Rng & Units 03/02/2021 02/25/2021 02/08/2021  ?Glucose 70 - 99 mg/dL 105(H) 113(H) 104(H)  ?BUN 6 - 20 mg/dL <5(L) 8 7  ?Creatinine 0.44 - 1.00 mg/dL 0.72 0.81 0.72  ?Sodium 135 - 145 mmol/L 135 137 136  ?Potassium 3.5 - 5.1 mmol/L 3.2(L) 3.6 3.3(L)  ?Chloride 98 - 111 mmol/L 99 104 100  ?CO2 22 - 32 mmol/L 29 27  26  ?Calcium 8.9 - 10.3 mg/dL 8.2(L) 9.1 8.8(L)  ?Total Protein 6.5 - 8.1 g/dL 6.7 7.7 7.7  ?Total Bilirubin 0.3 - 1.2 mg/dL 0.8 0.9 0.5  ?Alkaline Phos 38 - 126 U/L 80 82 99  ?AST 15 - 41 U/L 18 21 53(H)  ?ALT 0 - 44 U/L 13 19 60(H)  ? ? ?Lab Results  ?Component Value Date  ? WBC 5.2 03/02/2021  ? HGB 11.1 (L) 03/02/2021  ? HCT 33.8 (L) 03/02/2021  ? MCV 93.1 03/02/2021  ? PLT 224 03/02/2021  ? NEUTROABS 3.2 03/02/2021  ? ? ?ASSESSMENT & PLAN:  ?Malignant neoplasm of upper-outer quadrant of left breast in female, estrogen receptor negative ([place]) ?06/19/2016: left breast upper outer quadrant and left axillary lymph node biopsy 06/19/2016, both positive for a clinical T2-T3 N1, stage IIIB-C invasive ductal carcinoma, grade 3, triple negative, with an MIB-1 of 90%, BRCA2 mutation ?07/20/2016-09/10/2016: Neoadjuvant dose dense AC followed by Taxol carbo ?01/27/2017: Left lumpectomy: Complete pathologic response 5 lymph nodes negative ?03/11/2017-04/21/2017: Adjuvant radiation ?11/24/2019: Left breast recurrence/new primary: Grade 3 IDC ER 80%, HER2 negative, PR negative, Ki-67 90% ?01/2020: Bilateral  mastectomies: Left: Stage IIb grade 3 IDC weak ER positive ?02/19/2020 CMF x1 (stopped due to surgical wound dehiscence), foundation 1: BRCA2 mutation, a PTEN loss of exon 1 and RB1R 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  ERV B2 an

## 2021-03-21 ENCOUNTER — Other Ambulatory Visit (HOSPITAL_COMMUNITY): Payer: Self-pay

## 2021-03-24 ENCOUNTER — Inpatient Hospital Stay (HOSPITAL_BASED_OUTPATIENT_CLINIC_OR_DEPARTMENT_OTHER): Payer: Medicaid Other | Admitting: Hematology and Oncology

## 2021-03-24 ENCOUNTER — Encounter: Payer: Self-pay | Admitting: Gynecologic Oncology

## 2021-03-24 ENCOUNTER — Other Ambulatory Visit: Payer: Self-pay

## 2021-03-24 ENCOUNTER — Inpatient Hospital Stay: Payer: Medicaid Other | Attending: Hematology and Oncology | Admitting: Gynecologic Oncology

## 2021-03-24 ENCOUNTER — Other Ambulatory Visit (HOSPITAL_COMMUNITY): Payer: Self-pay

## 2021-03-24 VITALS — BP 128/76 | HR 79 | Temp 98.2°F | Resp 16 | Ht 66.93 in | Wt 254.0 lb

## 2021-03-24 VITALS — BP 127/79 | HR 72 | Temp 97.7°F | Resp 18 | Ht 66.93 in | Wt 259.1 lb

## 2021-03-24 DIAGNOSIS — Z1509 Genetic susceptibility to other malignant neoplasm: Secondary | ICD-10-CM | POA: Diagnosis not present

## 2021-03-24 DIAGNOSIS — Z79811 Long term (current) use of aromatase inhibitors: Secondary | ICD-10-CM | POA: Diagnosis not present

## 2021-03-24 DIAGNOSIS — Z9221 Personal history of antineoplastic chemotherapy: Secondary | ICD-10-CM | POA: Insufficient documentation

## 2021-03-24 DIAGNOSIS — Z1501 Genetic susceptibility to malignant neoplasm of breast: Secondary | ICD-10-CM | POA: Insufficient documentation

## 2021-03-24 DIAGNOSIS — Z7189 Other specified counseling: Secondary | ICD-10-CM

## 2021-03-24 DIAGNOSIS — Z1502 Genetic susceptibility to malignant neoplasm of ovary: Secondary | ICD-10-CM | POA: Insufficient documentation

## 2021-03-24 DIAGNOSIS — C50912 Malignant neoplasm of unspecified site of left female breast: Secondary | ICD-10-CM | POA: Diagnosis not present

## 2021-03-24 DIAGNOSIS — Z148 Genetic carrier of other disease: Secondary | ICD-10-CM | POA: Diagnosis not present

## 2021-03-24 DIAGNOSIS — C50412 Malignant neoplasm of upper-outer quadrant of left female breast: Secondary | ICD-10-CM | POA: Diagnosis present

## 2021-03-24 DIAGNOSIS — Z923 Personal history of irradiation: Secondary | ICD-10-CM | POA: Insufficient documentation

## 2021-03-24 DIAGNOSIS — Z90722 Acquired absence of ovaries, bilateral: Secondary | ICD-10-CM | POA: Insufficient documentation

## 2021-03-24 DIAGNOSIS — Z171 Estrogen receptor negative status [ER-]: Secondary | ICD-10-CM

## 2021-03-24 NOTE — Progress Notes (Signed)
Gynecologic Oncology Return Clinic Visit ? ?03/24/2021 ? ?Reason for Visit: Follow-up after recent surgery ? ?Treatment History: ?Oncology History  ?Malignant neoplasm of upper-outer quadrant of left breast in female, estrogen receptor negative ([place])  ?06/19/2016 Initial Diagnosis  ? status post left breast upper outer quadrant and left axillary lymph node biopsy, both positive for a clinical T2-T3 N1, stage IIIB-C invasive ductal carcinoma, grade 3, triple negative, with an MIB-1 of 90% ?  ?07/20/2016 - 12/21/2016 Neo-Adjuvant Chemotherapy  ? neoadjuvant chemotherapy to consist of doxorubicin and cyclophosphamide in dose dense fashion ?4 completed 09/10/2016 to be followed by paclitaxel weekly ?12 given with carboplatin  ?            (a) cycle 4 of cyclophosphamide and doxorubicin was delayed 10 days and dose decreased 10% because of febrile neutropenia after cycle 3 ?            (b) cycle 6 of Paclitaxel and Carboplatin delayed due to neutropenia, therefore Granix added to Wednesday, Thursday, Friday following chemotherapy days ?  ?08/17/2016 Genetic Testing  ? BRCA2 c.8169T>A and MSH2 c.2050G>T VUS found on the common hereditary cancer panel.  The Hereditary Gene Panel offered by [facility] includes sequencing and/or deletion duplication testing of the following 46 genes: APC, ATM, AXIN2, BARD1, BMPR1A, BRCA1, BRCA2, BRIP1, CDH1, CDKN2A (p14ARF), CDKN2A (p16INK4a), CHEK2, CTNNA1, DICER1, EPCAM (Deletion/duplication testing only), GREM1 (promoter region deletion/duplication testing only), KIT, MEN1, MLH1, MSH2, MSH3, MSH6, MUTYH, NBN, NF1, NHTL1, PALB2, PDGFRA, PMS2, POLD1, POLE, PTEN, RAD50, RAD51C, RAD51D, SDHB, SDHC, SDHD, SMAD4, SMARCA4. STK11, TP53, TSC1, TSC2, and VHL.  The following genes were evaluated for sequence changes only: SDHA and HOXB13 c.251G>A variant only.  The report date is August 17, 2016. ? ?  ?01/27/2017 Surgery  ? status post left lumpectomy and left axillary sentinel lymph node sampling with  pathology showing a complete pathologic response (ypT0 ypN0); 5 left axillary lymph nodes removed ?  ? ?  ?03/11/2017 - 04/21/2017 Radiation Therapy  ? adjuvant radiation 03/11/2017-04/21/2017  ?Site/dose:   1. Left breast, 2 Gy in 25 fractions for a total dose of 50 Gy ?                     2. Boost, 2 Gy in 5 fractions for a total dose of 10 Gy ?  ?  ?02/19/2020 -  Chemotherapy  ?  Patient is on Treatment Plan: BREAST ADJUVANT CMF IV Q21D ? ?  ? ?  ?Recurrent breast cancer, left ([place])  ?12/16/2019 Initial Diagnosis  ? Recurrent breast cancer, left ([place]) ?  ?02/19/2020 -  Chemotherapy  ?  Patient is on Treatment Plan: BREAST ADJUVANT CMF IV Q21D ? ?  ? ?  ?02/19/2020 Cancer Staging  ? Staging form: Breast, AJCC 8th Edition ?- Clinical: Stage Unknown (cT2, cNX, cM0, G2, ER+, PR-, HER2: Equivocal) - Signed by Chauncey Cruel, MD on 02/19/2020 ? ?  ? ?02/26/2021: Robotic assisted BSO, laparoscopic incisional hernia repair and port removal. ? ?Was seen to 74 in the emergency department after she presented with fever and abdominal pain.  She was noted to be tachycardic and febrile.  No leukocytosis noted on labs and lactic acid was normal.  CT scan showed fluid collection at the level of her prior umbilical hernia, likely seroma after recent surgery. ? ?Interval History: ?Patient reports overall doing well.  Felt a bulge postop and after developing a fever went to the emergency department.  She reports feeling well since that  visit.  She denies any further fevers or chills.  She continues to note the bulge at her umbilicus, denies any increase in its size.  She thinks maybe it is a little bit more tender but does not know if this is related to not using any pain medication any longer.  She denies any pelvic pain.  She reports normal bowel function.  She denies any bladder symptoms.  Endorses normal appetite. ? ?Past Medical/Surgical History: ?Past Medical History:  ?Diagnosis Date  ? Arthritis   ? Breast cancer ([place]) 11/24/2019   ? left   ? Complication of anesthesia   ? DAY SURGERY 2010 OR 2011 ASPIRATED AND STAYED OVERNIGHT  ? History of radiation therapy 03/11/17- 04/21/17  ? Left breast, 2 Gy in 25 fractions for a total dose of 50 Gy, Boost, 2 Gy in 5 fractions for a total dose of 10 Gy  ? Hypertension   ? OFF MEDS  SINCE CHEMO X 5 MONTHS  ? Malignant neoplasm of upper-outer quadrant of left breast in female, estrogen receptor negative ([place]) 06/23/2016  ? ? ?Past Surgical History:  ?Procedure Laterality Date  ? ANKLE SURGERY Right   ? BREAST CYST EXCISION Right 07/15/2020  ? Procedure: Excision excess right axillary tissue;  Surgeon: Cindra Presume, MD;  Location: [facility];  Service: Plastics;  Laterality: Right;  ? BREAST LUMPECTOMY Left 01/27/2017  ? BREAST LUMPECTOMY WITH RADIOACTIVE SEED AND SENTINEL LYMPH NODE BIOPSY Left 01/27/2017  ? Procedure: LEFT BREAST LUMPECTOMY WITH RADIOACTIVE SEED LOCALIZATION, LEFT AXILLARY DEEP LYMPH NODE BIOPSY WITH RADIOACTIVE SEED LOCALIZATION, LEFT AXILLARY SENTINEL LYMPH NODE MAPPING AND BIOPSY WITH BLUE DYE INJECTION;  Surgeon: [name], MD;  Location: [facility];  Service: General;  Laterality: Left;  ? COLONOSCOPY WITH PROPOFOL N/A 02/01/2020  ? Procedure: COLONOSCOPY WITH PROPOFOL;  Surgeon: Milus Banister, MD;  Location: [facility];  Service: Endoscopy;  Laterality: N/A;  ? DEBRIDEMENT AND CLOSURE WOUND Left 03/28/2020  ? Procedure: DEBRIDEMENT AND CLOSURE OF MASTECTOMY WOUND;  Surgeon: Rolm Bookbinder, MD;  Location: [facility];  Service: General;  Laterality: Left;  ? DEBRIDEMENT AND CLOSURE WOUND Left 07/15/2020  ? Procedure: Debridement of left chest wound and rotational flap closure;  Surgeon: Cindra Presume, MD;  Location: [facility];  Service: Plastics;  Laterality: Left;  1.5 hour  ? ENDOSCOPIC MUCOSAL RESECTION  02/01/2020  ? Procedure: ENDOSCOPIC MUCOSAL RESECTION;  Surgeon: Milus Banister, MD;  Location: [facility];  Service: Endoscopy;;  ? HEMOSTASIS CLIP PLACEMENT  02/01/2020  ?  Procedure: HEMOSTASIS CLIP PLACEMENT;  Surgeon: Milus Banister, MD;  Location: [facility];  Service: Endoscopy;;  ? INCISIONAL HERNIA REPAIR N/A 02/26/2021  ? Procedure: LAPAROSCOPIC INCISIONAL HERNIA WITH MESH;  Surgeon: Rolm Bookbinder, MD;  Location: [facility];  Service: General;  Laterality: N/A;  ? IR FLUORO GUIDE PORT INSERTION RIGHT  07/14/2016  ? IR US GUIDE VASC ACCESS RIGHT  07/14/2016  ? MASTECTOMY W/ SENTINEL NODE BIOPSY Bilateral 01/09/2020  ? Procedure: BILATERAL MASTECTOMY WITH LEFT AXILLARY SENTINEL LYMPH NODE BIOPSY;  Surgeon: Rolm Bookbinder, MD;  Location: [facility];  Service: General;  Laterality: Bilateral;  ? PORT-A-CATH REMOVAL Right 01/27/2017  ? Procedure: REMOVAL PORT-A-CATH;  Surgeon: [name], MD;  Location: [facility];  Service: General;  Laterality: Right;  ? PORT-A-CATH REMOVAL Right 02/26/2021  ? Procedure: REMOVAL PORT-A-CATH;  Surgeon: Rolm Bookbinder, MD;  Location: [facility];  Service: General;  Laterality: Right;  ? PORTACATH PLACEMENT Right 01/09/2020  ? Procedure:  INSERTION PORT-A-CATH;  Surgeon: Rolm Bookbinder, MD;  Location: [facility];  Service: General;  Laterality: Right;  ? ROBOTIC ASSISTED TOTAL HYSTERECTOMY WITH BILATERAL SALPINGO OOPHERECTOMY N/A 02/26/2021  ? Procedure: XI ROBOTIC ASSISTED BILATERAL SALPINGO OOPHORECTOMY;  Surgeon: Lafonda Mosses, MD;  Location: [facility];  Service: Gynecology;  Laterality: N/A;  ? SUBMUCOSAL LIFTING INJECTION  02/01/2020  ? Procedure: SUBMUCOSAL LIFTING INJECTION;  Surgeon: Milus Banister, MD;  Location: [facility];  Service: Endoscopy;;  ? TUBAL LIGATION    ? WISDOM TOOTH EXTRACTION    ? ? ?Family History  ?Problem Relation Age of Onset  ? Diabetes Mother   ? Hypertension Mother   ? Arthritis Mother   ? Colon cancer Father 57  ?     died .76 metastatic at time of diagnosis  ? Diabetes Father   ? Heart disease Father   ? Hypertension Father   ? Breast cancer Maternal Grandmother 58  ?      d.60s  ? Breast cancer Maternal Aunt 52  ? Cervical cancer Maternal Aunt 55  ? Breast cancer Maternal Aunt 66  ?     d.50s  ? Cancer Maternal Uncle   ?     d.62s unspecified type of cancer  ? Breast cance

## 2021-03-24 NOTE — Patient Instructions (Signed)
Overall you are healing well from surgery.  Remember, no heavy lifting for at least 6 weeks after surgery. ? ?Please do not hesitate to reach out if you need anything in the future.  I will release you back to your primary care provider and medical oncologist.  Given your BRCA2 mutation, even after BSO, I recommend a yearly visit and symptom review.  As you and I have discussed, symptoms that would be concerning for the possibility of primary peritoneal cancer include abdominal pain, change to bowel function, pelvic pressure, and unintentional weight loss.  If you were to have any of these between visits, it is important that you like your medical provider know. ?

## 2021-03-24 NOTE — Assessment & Plan Note (Signed)
06/19/2016: left breast upper outer quadrant and left axillary lymph node biopsy 06/19/2016, both positive for a clinical T2-T3 N1, stage IIIB-C invasive ductal carcinoma, grade 3, triple negative, with an MIB-1 of 90%, BRCA2 mutation ?07/20/2016-09/10/2016: Neoadjuvant dose dense AC followed by Taxol carbo ?01/27/2017: Left lumpectomy: Complete pathologic response 5 lymph nodes negative ?03/11/2017-04/21/2017: Adjuvant radiation ?11/24/2019: Left breast recurrence/new primary: Grade 3 IDC ER 80%, HER2 negative, PR negative, Ki-67 90% ?01/2020: Bilateral mastectomies: Left: Stage IIb grade 3 IDC weak ER positive ?02/19/2020 CMF x1 (stopped due to surgical wound dehiscence), foundation 1: BRCA2 mutation, a PTEN loss of exon 1 and RB1R 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  ERV B2 and PI K3 CA were negative, and the microsatellite status was stable, with low mutational burden (4/MB) ? ?Current treatment:  ?Anastrozole started 04/18/2020 ?Olaparib started 08/13/2020: Current dose 300 mg twice daily ? ? ? ? ? ?

## 2021-03-25 ENCOUNTER — Ambulatory Visit: Payer: Medicaid Other | Admitting: Hematology and Oncology

## 2021-03-26 LAB — SURGICAL PATHOLOGY

## 2021-03-29 ENCOUNTER — Other Ambulatory Visit: Payer: Self-pay

## 2021-04-03 ENCOUNTER — Other Ambulatory Visit (HOSPITAL_COMMUNITY): Payer: Self-pay

## 2021-04-03 ENCOUNTER — Ambulatory Visit: Payer: Medicaid Other | Attending: Internal Medicine | Admitting: Internal Medicine

## 2021-04-11 ENCOUNTER — Other Ambulatory Visit (HOSPITAL_COMMUNITY): Payer: Self-pay

## 2021-04-11 ENCOUNTER — Other Ambulatory Visit: Payer: Self-pay | Admitting: Oncology

## 2021-04-11 DIAGNOSIS — E876 Hypokalemia: Secondary | ICD-10-CM

## 2021-04-15 ENCOUNTER — Other Ambulatory Visit (HOSPITAL_COMMUNITY): Payer: Self-pay

## 2021-04-15 ENCOUNTER — Ambulatory Visit: Payer: Medicaid Other | Admitting: Internal Medicine

## 2021-04-16 ENCOUNTER — Other Ambulatory Visit: Payer: Self-pay | Admitting: *Deleted

## 2021-04-16 ENCOUNTER — Other Ambulatory Visit (HOSPITAL_COMMUNITY): Payer: Self-pay

## 2021-04-16 DIAGNOSIS — E876 Hypokalemia: Secondary | ICD-10-CM

## 2021-04-16 MED ORDER — POTASSIUM CHLORIDE CRYS ER 20 MEQ PO TBCR
EXTENDED_RELEASE_TABLET | Freq: Every day | ORAL | 2 refills | Status: DC
  Filled 2021-04-16 – 2021-05-09 (×2): qty 30, 30d supply, fill #0
  Filled 2021-06-09: qty 30, 30d supply, fill #1
  Filled 2021-07-30: qty 30, 30d supply, fill #2

## 2021-04-22 ENCOUNTER — Other Ambulatory Visit (HOSPITAL_COMMUNITY): Payer: Self-pay

## 2021-04-23 ENCOUNTER — Other Ambulatory Visit (HOSPITAL_COMMUNITY): Payer: Self-pay

## 2021-04-24 ENCOUNTER — Other Ambulatory Visit (HOSPITAL_COMMUNITY): Payer: Self-pay

## 2021-04-28 ENCOUNTER — Other Ambulatory Visit (HOSPITAL_COMMUNITY): Payer: Self-pay

## 2021-05-01 ENCOUNTER — Encounter: Payer: Self-pay | Admitting: Oncology

## 2021-05-01 ENCOUNTER — Telehealth: Payer: Self-pay

## 2021-05-01 ENCOUNTER — Other Ambulatory Visit: Payer: Self-pay | Admitting: Hematology and Oncology

## 2021-05-01 ENCOUNTER — Other Ambulatory Visit (HOSPITAL_COMMUNITY): Payer: Self-pay

## 2021-05-01 MED ORDER — TRAMADOL HCL 50 MG PO TABS
50.0000 mg | ORAL_TABLET | Freq: Two times a day (BID) | ORAL | 0 refills | Status: DC | PRN
  Filled 2021-05-01: qty 60, 30d supply, fill #0

## 2021-05-01 NOTE — Telephone Encounter (Signed)
Pt called and requested refill on Tramadol last filled 11/2020. Pt states she still needs this medication d/t her osteoarthritis so she is able to work. Pt reports she typically takes one tablet once or twice a day depending on activity. Originally, this medication was prescribed for the pain to her chest wall d/t dehiscence from surgery, but pt continued to take it d/t osteoarthritis. All information given to MD to determine if appropriate for refill. Pt is aware and verbalized thanks.  ?

## 2021-05-09 ENCOUNTER — Other Ambulatory Visit (HOSPITAL_COMMUNITY): Payer: Self-pay

## 2021-05-09 ENCOUNTER — Other Ambulatory Visit: Payer: Self-pay | Admitting: Oncology

## 2021-05-22 ENCOUNTER — Other Ambulatory Visit: Payer: Self-pay | Admitting: *Deleted

## 2021-05-22 DIAGNOSIS — Z171 Estrogen receptor negative status [ER-]: Secondary | ICD-10-CM

## 2021-05-22 DIAGNOSIS — C50912 Malignant neoplasm of unspecified site of left female breast: Secondary | ICD-10-CM

## 2021-05-22 NOTE — Progress Notes (Signed)
Received call from pt with complaint of generalized bone pain x several weeks.  Verbal orders received from MD for pt to undergo bone scan for evaluation of metastatic disease due to pt hx of triple negative breast cancer.  Orders placed, appt will be scheduled once PA is obtained.

## 2021-05-23 ENCOUNTER — Telehealth: Payer: Self-pay | Admitting: *Deleted

## 2021-05-23 NOTE — Telephone Encounter (Signed)
RN placed call to pt to discuss the need for bone scan. Pt verbalized understanding.

## 2021-05-26 ENCOUNTER — Inpatient Hospital Stay: Payer: Medicaid Other

## 2021-05-26 ENCOUNTER — Encounter (HOSPITAL_COMMUNITY)
Admission: RE | Admit: 2021-05-26 | Discharge: 2021-05-26 | Disposition: A | Discharge status: Home / Self Care | Payer: Medicaid Other | Source: Ambulatory Visit | Attending: Hematology and Oncology | Admitting: Hematology and Oncology

## 2021-05-26 ENCOUNTER — Inpatient Hospital Stay: Payer: Medicaid Other | Admitting: Hematology and Oncology

## 2021-05-26 DIAGNOSIS — C50912 Malignant neoplasm of unspecified site of left female breast: Secondary | ICD-10-CM | POA: Insufficient documentation

## 2021-05-26 DIAGNOSIS — C50412 Malignant neoplasm of upper-outer quadrant of left female breast: Secondary | ICD-10-CM | POA: Diagnosis present

## 2021-05-26 DIAGNOSIS — Z171 Estrogen receptor negative status [ER-]: Secondary | ICD-10-CM | POA: Insufficient documentation

## 2021-05-26 MED ORDER — TECHNETIUM TC 99M MEDRONATE IV KIT
20.0000 | PACK | Freq: Once | INTRAVENOUS | Status: AC | PRN
  Administered 2021-05-26: 20 via INTRAVENOUS

## 2021-05-27 ENCOUNTER — Other Ambulatory Visit (HOSPITAL_COMMUNITY): Payer: Self-pay

## 2021-05-27 ENCOUNTER — Inpatient Hospital Stay: Payer: Medicaid Other | Attending: Hematology and Oncology

## 2021-05-27 ENCOUNTER — Inpatient Hospital Stay (HOSPITAL_BASED_OUTPATIENT_CLINIC_OR_DEPARTMENT_OTHER): Payer: Medicaid Other | Admitting: Hematology and Oncology

## 2021-05-27 DIAGNOSIS — Z148 Genetic carrier of other disease: Secondary | ICD-10-CM | POA: Diagnosis not present

## 2021-05-27 DIAGNOSIS — Z9013 Acquired absence of bilateral breasts and nipples: Secondary | ICD-10-CM | POA: Diagnosis not present

## 2021-05-27 DIAGNOSIS — M898X9 Other specified disorders of bone, unspecified site: Secondary | ICD-10-CM | POA: Diagnosis not present

## 2021-05-27 DIAGNOSIS — C50412 Malignant neoplasm of upper-outer quadrant of left female breast: Secondary | ICD-10-CM | POA: Insufficient documentation

## 2021-05-27 DIAGNOSIS — Z79811 Long term (current) use of aromatase inhibitors: Secondary | ICD-10-CM | POA: Diagnosis not present

## 2021-05-27 DIAGNOSIS — Z923 Personal history of irradiation: Secondary | ICD-10-CM | POA: Diagnosis not present

## 2021-05-27 DIAGNOSIS — Z171 Estrogen receptor negative status [ER-]: Secondary | ICD-10-CM | POA: Insufficient documentation

## 2021-05-27 DIAGNOSIS — C50912 Malignant neoplasm of unspecified site of left female breast: Secondary | ICD-10-CM

## 2021-05-27 DIAGNOSIS — Z9221 Personal history of antineoplastic chemotherapy: Secondary | ICD-10-CM | POA: Diagnosis not present

## 2021-05-27 LAB — CMP (CANCER CENTER ONLY)
ALT: 12 U/L (ref 0–44)
AST: 17 U/L (ref 15–41)
Albumin: 4 g/dL (ref 3.5–5.0)
Alkaline Phosphatase: 74 U/L (ref 38–126)
Anion gap: 5 (ref 5–15)
BUN: 7 mg/dL (ref 6–20)
CO2: 31 mmol/L (ref 22–32)
Calcium: 9.4 mg/dL (ref 8.9–10.3)
Chloride: 103 mmol/L (ref 98–111)
Creatinine: 0.8 mg/dL (ref 0.44–1.00)
GFR, Estimated: >60 mL/min (ref >60)
Glucose, Bld: 121 mg/dL — ABNORMAL HIGH (ref 70–99)
Potassium: 3.6 mmol/L (ref 3.5–5.1)
Sodium: 139 mmol/L (ref 135–145)
Total Bilirubin: 0.9 mg/dL (ref 0.3–1.2)
Total Protein: 7.3 g/dL (ref 6.5–8.1)

## 2021-05-27 LAB — CBC WITH DIFFERENTIAL (CANCER CENTER ONLY)
Abs Immature Granulocytes: 0.01 10*3/uL (ref 0.00–0.07)
Basophils Absolute: 0 10*3/uL (ref 0.0–0.1)
Basophils Relative: 1 %
Eosinophils Absolute: 0.2 10*3/uL (ref 0.0–0.5)
Eosinophils Relative: 4 %
HCT: 38.1 % (ref 36.0–46.0)
Hemoglobin: 12.8 g/dL (ref 12.0–15.0)
Immature Granulocytes: 0 %
Lymphocytes Relative: 26 %
Lymphs Abs: 0.9 10*3/uL (ref 0.7–4.0)
MCH: 30.1 pg (ref 26.0–34.0)
MCHC: 33.6 g/dL (ref 30.0–36.0)
MCV: 89.6 fL (ref 80.0–100.0)
Monocytes Absolute: 0.3 10*3/uL (ref 0.1–1.0)
Monocytes Relative: 7 %
Neutro Abs: 2.3 10*3/uL (ref 1.7–7.7)
Neutrophils Relative %: 62 %
Platelet Count: 166 10*3/uL (ref 150–400)
RBC: 4.25 MIL/uL (ref 3.87–5.11)
RDW: 13.3 % (ref 11.5–15.5)
WBC Count: 3.7 10*3/uL — ABNORMAL LOW (ref 4.0–10.5)
nRBC: 0 % (ref 0.0–0.2)

## 2021-05-27 MED ORDER — ANASTROZOLE 1 MG PO TABS
1.0000 mg | ORAL_TABLET | Freq: Every day | ORAL | 4 refills | Status: DC
  Filled 2021-05-27 – 2021-06-23 (×2): qty 90, 90d supply, fill #0
  Filled 2021-07-30 – 2021-09-25 (×3): qty 90, 90d supply, fill #1
  Filled 2021-11-18 – 2021-12-25 (×3): qty 90, 90d supply, fill #2
  Filled 2022-03-04 – 2022-03-19 (×2): qty 90, 90d supply, fill #3
  Filled 2022-04-30: qty 90, 90d supply, fill #4

## 2021-05-27 NOTE — Assessment & Plan Note (Signed)
06/19/2016:left breast upper outer quadrant and left axillary lymph node biopsy 06/19/2016, both positive for a clinical T2-T3 N1, stage IIIB-C invasive ductal carcinoma, grade 3, triple negative, with an MIB-1 of 90%, BRCA2 mutation 07/20/2016-09/10/2016: Neoadjuvant dose dense AC followed by Taxol carbo 01/27/2017: Left lumpectomy: Complete pathologic response 5 lymph nodes negative 03/11/2017-04/21/2017: Adjuvant radiation 11/24/2019: Left breast recurrence/new primary: Grade 3 IDC ER 80%, HER2 negative, PR negative, Ki-67 90% 01/2020: Bilateral mastectomies: Left: Stage IIb grade 3 IDC weak ER positive 02/19/2020 CMF x1 (stopped due to surgical wound dehiscence), foundation 1: BRCA2 mutation, a PTEN loss of exon 1 and RB1R 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 ERV B2 and PI K3 CA were negative, and the microsatellite status was stable, with low mutational burden (4/MB) Protracted healing from mastectomies Bilateral salpingo-oophorectomy March 2023   Current treatment:  Anastrozole started 04/18/2020 Olaparib started 08/13/2020: Current dose 300 mg twice daily  She will complete 1 year of olaparib as of August 2023 and after that she will discontinue.  Breast cancer surveillance: 1.  Breast exam 05/27/2021: Benign 2. no role of imaging since she had bilateral mastectomies.  Return to clinic in 1 year for follow-up

## 2021-05-27 NOTE — Progress Notes (Signed)
I got  Patient Care Team: [name], MD as PCP - General (Internal Medicine) Magrinat, [name], MD (Inactive) as Consulting Physician (Oncology) [name], MD as Attending Physician (Radiation Oncology) Delice Bison, Charlestine Massed, NP as Nurse Practitioner (Hematology and Oncology) Rolm Bookbinder, MD as Consulting Physician (General Surgery) Pace, Steffanie Dunn, MD as Consulting Physician (Plastic Surgery) Mauro Kaufmann, RN as Oncology Nurse Navigator Rockwell Germany, RN as Oncology Nurse Navigator Milus Banister, MD as Attending Physician (Gastroenterology) Ricard Dillon, MD as Consulting Physician (Internal Medicine)  DIAGNOSIS:  Encounter Diagnosis  Name Primary?   Malignant neoplasm of upper-outer quadrant of left breast in female, estrogen receptor negative ([place])     SUMMARY OF ONCOLOGIC HISTORY: Oncology History  Malignant neoplasm of upper-outer quadrant of left breast in female, estrogen receptor negative ([place])  06/19/2016 Initial Diagnosis   status post left breast upper outer quadrant and left axillary lymph node biopsy, both positive for a clinical T2-T3 N1, stage IIIB-C invasive ductal carcinoma, grade 3, triple negative, with an MIB-1 of 90%    07/20/2016 - 12/21/2016 Neo-Adjuvant Chemotherapy   neoadjuvant chemotherapy to consist of doxorubicin and cyclophosphamide in dose dense fashion 4 completed 09/10/2016 to be followed by paclitaxel weekly 12 given with carboplatin              (a) cycle 4 of cyclophosphamide and doxorubicin was delayed 10 days and dose decreased 10% because of febrile neutropenia after cycle 3             (b) cycle 6 of Paclitaxel and Carboplatin delayed due to neutropenia, therefore Granix added to Wednesday, Thursday, Friday following chemotherapy days    08/17/2016 Genetic Testing   BRCA2 c.8169T>A and MSH2 c.2050G>T VUS found on the common hereditary cancer panel.  The Hereditary Gene Panel offered by [facility] includes  sequencing and/or deletion duplication testing of the following 46 genes: APC, ATM, AXIN2, BARD1, BMPR1A, BRCA1, BRCA2, BRIP1, CDH1, CDKN2A (p14ARF), CDKN2A (p16INK4a), CHEK2, CTNNA1, DICER1, EPCAM (Deletion/duplication testing only), GREM1 (promoter region deletion/duplication testing only), KIT, MEN1, MLH1, MSH2, MSH3, MSH6, MUTYH, NBN, NF1, NHTL1, PALB2, PDGFRA, PMS2, POLD1, POLE, PTEN, RAD50, RAD51C, RAD51D, SDHB, SDHC, SDHD, SMAD4, SMARCA4. STK11, TP53, TSC1, TSC2, and VHL.  The following genes were evaluated for sequence changes only: SDHA and HOXB13 c.251G>A variant only.  The report date is August 17, 2016.     01/27/2017 Surgery   status post left lumpectomy and left axillary sentinel lymph node sampling with pathology showing a complete pathologic response (ypT0 ypN0); 5 left axillary lymph nodes removed       03/11/2017 - 04/21/2017 Radiation Therapy   adjuvant radiation 03/11/2017-04/21/2017  Site/dose:   1. Left breast, 2 Gy in 25 fractions for a total dose of 50 Gy                      2. Boost, 2 Gy in 5 fractions for a total dose of 10 Gy      02/19/2020 - 02/19/2020 Chemotherapy   Patient is on Treatment Plan : BREAST Adjuvant CMF IV q21d      Recurrent breast cancer, left ([place])  12/16/2019 Initial Diagnosis   Recurrent breast cancer, left ([place])    02/19/2020 - 02/19/2020 Chemotherapy   Patient is on Treatment Plan : BREAST Adjuvant CMF IV q21d      02/19/2020 Cancer Staging   Staging form: Breast, AJCC 8th Edition - Clinical: Stage Unknown (cT2, cNX, cM0, G2, ER+, PR-, HER2:  Equivocal) - Signed by Chauncey Cruel, MD on 02/19/2020      CHIEF COMPLIANT: Triple negative breast cancer; 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 She presents to the clinic today for a follow-up. She states she had increase bone pain. She states the bone pain is more in the legs. States  more in the left knee and it starts at night. Bone scan was done and it did not show bone mets journals   ALLERGIES:  is allergic to carboplatin.  MEDICATIONS:  Current Outpatient Medications  Medication Sig Dispense Refill   anastrozole (ARIMIDEX) 1 MG tablet Take 1 tablet (1 mg total) by mouth daily. 90 tablet 4   docusate sodium (COLACE) 100 MG capsule Take 100 mg by mouth daily.     gabapentin (NEURONTIN) 100 MG capsule TAKE 2 CAPSULES BY MOUTH IN THE MORNING AND 2 CAPSULES IN THE AFTERNOON (Patient taking differently: Take 100 mg by mouth 2 (two) times daily as needed (neuropathy).) 240 capsule 3   gabapentin (NEURONTIN) 300 MG capsule Take 3 capsules by mouth at bedtime. 180 capsule 4   olaparib (LYNPARZA) 150 MG tablet Take 2 tablets (300 mg total) by mouth 2 (two) times daily. Swallow whole. May take with food to decrease nausea and vomiting.Start May 23, 2020 120 tablet 6   potassium chloride SA (KLOR-CON M) 20 MEQ tablet TAKE 1 TABLET BY MOUTH ONCE A DAY 30 tablet 2   traMADol (ULTRAM) 50 MG tablet Take 1 tablet by mouth every 12 hours as needed. 60 tablet 0   No current facility-administered medications for this visit.    PHYSICAL EXAMINATION: ECOG PERFORMANCE STATUS: 1 - Symptomatic but completely ambulatory  Vitals:   05/27/21 1122  BP: 122/86  Pulse: 83  Resp: 18  Temp: (!) 97.5 F (36.4 C)  SpO2: 98%   Filed Weights   05/27/21 1122  Weight: 254 lb 1.6 oz (115.3 kg)    BREAST: General search here and just do that on this case I will select no there is no answer at 1 we will do additional health to go with the in there agree that he was volume from this issue 5 no palpable masses or nodules in either right or left breasts. No palpable axillary supraclavicular or infraclavicular adenopathy no breast tenderness or nipple discharge. (exam performed in the presence of a chaperone)  LABORATORY DATA:  I have reviewed the data as listed    Latest Ref Rng & Units  05/27/2021   11:09 AM 03/02/2021   12:52 PM 02/25/2021    8:41 AM  CMP  Glucose 70 - 99 mg/dL 121   105   113    BUN 6 - 20 mg/dL 7   <5   8    Creatinine 0.44 - 1.00 mg/dL 0.80   0.72   0.81    Sodium 135 - 145 mmol/L 139   135   137    Potassium 3.5 - 5.1 mmol/L 3.6   3.2   3.6    Chloride 98 - 111 mmol/L 103   99   104    CO2 22 - 32 mmol/L 31   29   27     Calcium 8.9 - 10.3 mg/dL 9.4   8.2   9.1    Total Protein 6.5 - 8.1 g/dL 7.3   6.7   7.7    Total  Bilirubin 0.3 - 1.2 mg/dL 0.9   0.8   0.9    Alkaline Phos 38 - 126 U/L 74   80   82    AST 15 - 41 U/L 17   18   21     ALT 0 - 44 U/L 12   13   19       Lab Results  Component Value Date   WBC 3.7 (L) 05/27/2021   HGB 12.8 05/27/2021   HCT 38.1 05/27/2021   MCV 89.6 05/27/2021   PLT 166 05/27/2021   NEUTROABS 2.3 05/27/2021    ASSESSMENT & PLAN:  Malignant neoplasm of upper-outer quadrant of left breast in female, estrogen receptor negative ([place]) 06/19/2016: left breast upper outer quadrant and left axillary lymph node biopsy 06/19/2016, both positive for a clinical T2-T3 N1, stage IIIB-C invasive ductal carcinoma, grade 3, triple negative, with an MIB-1 of 90%, BRCA2 mutation 07/20/2016-09/10/2016: Neoadjuvant dose dense AC followed by Taxol carbo 01/27/2017: Left lumpectomy: Complete pathologic response 5 lymph nodes negative 03/11/2017-04/21/2017: Adjuvant radiation 11/24/2019: Left breast recurrence/new primary: Grade 3 IDC ER 80%, HER2 negative, PR negative, Ki-67 90% 01/2020: Bilateral mastectomies: Left: Stage IIb grade 3 IDC weak ER positive 02/19/2020 CMF x1 (stopped due to surgical wound dehiscence), foundation 1: BRCA2 mutation, a PTEN loss of exon 1 and RB1R 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  ERV B2 and PI K3 CA were negative, and the microsatellite status was stable, with low mutational burden (4/MB) Protracted healing from mastectomies Bilateral salpingo-oophorectomy March 2023     Current treatment:  Anastrozole started  04/18/2020 Olaparib started 08/13/2020: Current dose 300 mg twice daily  She will complete 1 year of olaparib by Dec 2023 and after that she will discontinue.  Breast cancer surveillance: 1.  Breast exam 05/27/2021: Benign 2. no role of imaging since she had bilateral mastectomies.  Return to clinic in 1 year for follow-up   Orders Placed This Encounter  Procedures   CBC with Differential ([place] Only)    Standing Status:   Future    Standing Expiration Date:   05/28/2022   CMP ([place] only)    Standing Status:   Future    Standing Expiration Date:   05/28/2022   The patient has a good understanding of the overall plan. she agrees with it. she will call with any problems that may develop before the next visit here. Total time spent: 30 mins including face to face time and time spent for planning, charting and co-ordination of care   Harriette Ohara, MD 05/27/21    I [name] Coins am scribing for Dr. Lindi Adie  I have reviewed the above documentation for accuracy and completeness, and I agree with the above.

## 2021-05-28 ENCOUNTER — Telehealth: Payer: Self-pay | Admitting: Hematology and Oncology

## 2021-05-28 ENCOUNTER — Other Ambulatory Visit (HOSPITAL_COMMUNITY): Payer: Self-pay

## 2021-05-28 NOTE — Telephone Encounter (Signed)
Scheduled appointment per 5/23 los. Left message.

## 2021-06-03 ENCOUNTER — Other Ambulatory Visit (HOSPITAL_COMMUNITY): Payer: Self-pay

## 2021-06-05 ENCOUNTER — Other Ambulatory Visit (HOSPITAL_COMMUNITY): Payer: Self-pay

## 2021-06-09 ENCOUNTER — Other Ambulatory Visit (HOSPITAL_COMMUNITY): Payer: Self-pay

## 2021-06-09 ENCOUNTER — Other Ambulatory Visit: Payer: Self-pay | Admitting: Hematology and Oncology

## 2021-06-09 ENCOUNTER — Other Ambulatory Visit: Payer: Self-pay

## 2021-06-09 NOTE — Telephone Encounter (Signed)
This was a [facility] pt.  Val stated she had a pretty intense breast removal surgery and they had to scrape the bone of her chest wall which took a long time to heal.  Takes tramadol for the continuous pain.

## 2021-06-10 ENCOUNTER — Encounter: Payer: Self-pay | Admitting: Hematology and Oncology

## 2021-06-10 ENCOUNTER — Other Ambulatory Visit (HOSPITAL_COMMUNITY): Payer: Self-pay

## 2021-06-10 MED ORDER — TRAMADOL HCL 50 MG PO TABS
50.0000 mg | ORAL_TABLET | Freq: Two times a day (BID) | ORAL | 0 refills | Status: DC | PRN
  Filled 2021-06-10: qty 60, 30d supply, fill #0

## 2021-06-11 ENCOUNTER — Other Ambulatory Visit (HOSPITAL_COMMUNITY): Payer: Self-pay

## 2021-06-13 ENCOUNTER — Other Ambulatory Visit (HOSPITAL_COMMUNITY): Payer: Self-pay

## 2021-06-23 ENCOUNTER — Other Ambulatory Visit (HOSPITAL_COMMUNITY): Payer: Self-pay

## 2021-06-25 ENCOUNTER — Other Ambulatory Visit (HOSPITAL_COMMUNITY): Payer: Self-pay

## 2021-06-26 ENCOUNTER — Other Ambulatory Visit (HOSPITAL_COMMUNITY): Payer: Self-pay

## 2021-06-27 ENCOUNTER — Other Ambulatory Visit (HOSPITAL_COMMUNITY): Payer: Self-pay

## 2021-06-30 ENCOUNTER — Other Ambulatory Visit (HOSPITAL_COMMUNITY): Payer: Self-pay

## 2021-07-01 ENCOUNTER — Other Ambulatory Visit (HOSPITAL_COMMUNITY): Payer: Self-pay

## 2021-07-10 ENCOUNTER — Other Ambulatory Visit (HOSPITAL_COMMUNITY): Payer: Self-pay

## 2021-07-14 ENCOUNTER — Other Ambulatory Visit (HOSPITAL_COMMUNITY): Payer: Self-pay

## 2021-07-18 ENCOUNTER — Other Ambulatory Visit (HOSPITAL_COMMUNITY): Payer: Self-pay

## 2021-07-30 ENCOUNTER — Other Ambulatory Visit: Payer: Self-pay | Admitting: Adult Health

## 2021-07-30 ENCOUNTER — Other Ambulatory Visit: Payer: Self-pay

## 2021-07-31 ENCOUNTER — Other Ambulatory Visit (HOSPITAL_COMMUNITY): Payer: Self-pay

## 2021-08-01 ENCOUNTER — Encounter: Payer: Self-pay | Admitting: Hematology and Oncology

## 2021-08-01 ENCOUNTER — Other Ambulatory Visit (HOSPITAL_COMMUNITY): Payer: Self-pay

## 2021-08-01 MED ORDER — TRAMADOL HCL 50 MG PO TABS
50.0000 mg | ORAL_TABLET | Freq: Two times a day (BID) | ORAL | 0 refills | Status: DC | PRN
Start: 2021-08-01 — End: 2021-09-11
  Filled 2021-08-01: qty 60, 30d supply, fill #0

## 2021-08-06 ENCOUNTER — Telehealth: Payer: Self-pay | Admitting: Hematology and Oncology

## 2021-08-06 ENCOUNTER — Other Ambulatory Visit (HOSPITAL_COMMUNITY): Payer: Self-pay

## 2021-08-06 NOTE — Telephone Encounter (Signed)
Rescheduled appointment per provider PAL. Patient is aware of the changes made to her upcoming appointment. 

## 2021-08-22 ENCOUNTER — Other Ambulatory Visit: Payer: Self-pay | Admitting: Oncology

## 2021-08-22 ENCOUNTER — Other Ambulatory Visit (HOSPITAL_COMMUNITY): Payer: Self-pay

## 2021-08-27 ENCOUNTER — Other Ambulatory Visit (HOSPITAL_COMMUNITY): Payer: Self-pay

## 2021-08-27 MED ORDER — GABAPENTIN 100 MG PO CAPS
ORAL_CAPSULE | ORAL | 3 refills | Status: DC
  Filled 2021-08-27: qty 120, 30d supply, fill #0
  Filled 2021-09-11 – 2021-09-24 (×2): qty 240, 60d supply, fill #0
  Filled 2021-11-18: qty 240, 60d supply, fill #1
  Filled 2021-12-25 – 2022-01-02 (×4): qty 240, 60d supply, fill #2
  Filled 2022-03-04 – 2022-03-19 (×2): qty 240, 60d supply, fill #3

## 2021-08-27 MED ORDER — GABAPENTIN 300 MG PO CAPS
900.0000 mg | ORAL_CAPSULE | Freq: Every day | ORAL | 4 refills | Status: DC
  Filled 2021-08-27: qty 90, 30d supply, fill #0
  Filled 2021-09-11 – 2021-09-24 (×2): qty 180, 60d supply, fill #0
  Filled 2021-11-18: qty 180, 60d supply, fill #1
  Filled 2021-12-25 – 2022-03-19 (×5): qty 180, 60d supply, fill #2
  Filled 2022-04-30 – 2022-05-04 (×2): qty 180, 60d supply, fill #3
  Filled 2022-08-01: qty 180, 60d supply, fill #4

## 2021-08-29 ENCOUNTER — Inpatient Hospital Stay: Payer: Medicaid Other | Admitting: Hematology and Oncology

## 2021-08-29 ENCOUNTER — Inpatient Hospital Stay: Payer: Medicaid Other

## 2021-09-09 ENCOUNTER — Other Ambulatory Visit (HOSPITAL_COMMUNITY): Payer: Self-pay

## 2021-09-11 ENCOUNTER — Other Ambulatory Visit (HOSPITAL_COMMUNITY): Payer: Self-pay

## 2021-09-11 ENCOUNTER — Other Ambulatory Visit: Payer: Self-pay | Admitting: Hematology and Oncology

## 2021-09-11 ENCOUNTER — Other Ambulatory Visit: Payer: Self-pay

## 2021-09-11 ENCOUNTER — Other Ambulatory Visit: Payer: Self-pay | Admitting: Internal Medicine

## 2021-09-11 DIAGNOSIS — E876 Hypokalemia: Secondary | ICD-10-CM

## 2021-09-11 MED ORDER — TRAMADOL HCL 50 MG PO TABS: 50.0000 mg | ORAL_TABLET | Freq: Two times a day (BID) | ORAL | 0 refills | Status: DC | PRN

## 2021-09-11 MED ORDER — POTASSIUM CHLORIDE CRYS ER 20 MEQ PO TBCR
20.0000 meq | EXTENDED_RELEASE_TABLET | Freq: Every day | ORAL | 2 refills | Status: DC
  Filled 2021-09-11 – 2021-09-24 (×2): qty 30, 30d supply, fill #0
  Filled 2021-11-18: qty 30, 30d supply, fill #1
  Filled 2021-12-25: qty 30, 30d supply, fill #2

## 2021-09-16 ENCOUNTER — Other Ambulatory Visit (HOSPITAL_COMMUNITY): Payer: Self-pay

## 2021-09-16 ENCOUNTER — Other Ambulatory Visit: Payer: Self-pay | Admitting: Hematology and Oncology

## 2021-09-16 ENCOUNTER — Inpatient Hospital Stay: Payer: Medicaid Other | Admitting: Hematology and Oncology

## 2021-09-16 ENCOUNTER — Inpatient Hospital Stay: Payer: Medicaid Other | Attending: Hematology and Oncology

## 2021-09-16 NOTE — Assessment & Plan Note (Deleted)
06/19/2016:left breast upper outer quadrant and left axillary lymph node biopsy 06/19/2016, both positive for a clinical T2-T3 N1, stage IIIB-C invasive ductal carcinoma, grade 3, triple negative, with an MIB-1 of 90%, BRCA2 mutation 07/20/2016-09/10/2016: Neoadjuvant dose dense AC followed by Taxol carbo 01/27/2017: Left lumpectomy: Complete pathologic response 5 lymph nodes negative 03/11/2017-04/21/2017: Adjuvant radiation 11/24/2019: Left breast recurrence/new primary: Grade 3 IDC ER 80%, HER2 negative, PR negative, Ki-67 90% 01/2020: Bilateral mastectomies: Left: Stage IIb grade 3 IDC weak ER positive 02/19/2020 CMF x1 (stopped due to surgical wound dehiscence), foundation 1:BRCA2 mutation, a PTEN loss of exon 1 and RB1R 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 ERV B2 and PI K3 CA were negative, and the microsatellite status was stable, with low mutational burden (4/MB) Protracted healing from mastectomies Bilateral salpingo-oophorectomy March 2023   Current treatment: Anastrozole started 04/18/2020 Olaparib started 08/13/2020: Current dose 300 mg twice daily  She will complete 1 year of olaparib by Dec 2023 and after that she will discontinue.  Breast cancer surveillance: 1.  Breast exam 05/27/2021: Benign 2. no role of imaging since she had bilateral mastectomies.  Return to clinic in 1 year for follow-up

## 2021-09-17 ENCOUNTER — Other Ambulatory Visit (HOSPITAL_COMMUNITY): Payer: Self-pay

## 2021-09-20 ENCOUNTER — Other Ambulatory Visit (HOSPITAL_COMMUNITY): Payer: Self-pay

## 2021-09-22 ENCOUNTER — Other Ambulatory Visit (HOSPITAL_COMMUNITY): Payer: Self-pay

## 2021-09-23 ENCOUNTER — Telehealth: Payer: Self-pay | Admitting: Hematology and Oncology

## 2021-09-23 NOTE — Telephone Encounter (Signed)
Scheduled appointments per 9/12 staff message. Patient is aware.

## 2021-09-24 ENCOUNTER — Encounter: Payer: Self-pay | Admitting: Hematology and Oncology

## 2021-09-24 ENCOUNTER — Other Ambulatory Visit (HOSPITAL_COMMUNITY): Payer: Self-pay

## 2021-09-24 ENCOUNTER — Other Ambulatory Visit: Payer: Self-pay | Admitting: Hematology and Oncology

## 2021-09-25 ENCOUNTER — Other Ambulatory Visit: Payer: Self-pay | Admitting: Hematology and Oncology

## 2021-09-25 ENCOUNTER — Other Ambulatory Visit (HOSPITAL_COMMUNITY): Payer: Self-pay

## 2021-09-25 ENCOUNTER — Encounter: Payer: Self-pay | Admitting: Hematology and Oncology

## 2021-09-25 MED ORDER — TRAMADOL HCL 50 MG PO TABS
50.0000 mg | ORAL_TABLET | Freq: Two times a day (BID) | ORAL | 0 refills | Status: DC | PRN
  Filled 2021-09-25: qty 60, 30d supply, fill #0

## 2021-10-07 NOTE — Progress Notes (Signed)
Patient Care Team: [name], MD as PCP - General (Internal Medicine) Magrinat, [name], MD (Inactive) as Consulting Physician (Oncology) [name], MD as Attending Physician (Radiation Oncology) Delice Bison, Charlestine Massed, NP as Nurse Practitioner (Hematology and Oncology) Rolm Bookbinder, MD as Consulting Physician (General Surgery) Pace, Steffanie Dunn, MD (Inactive) as Consulting Physician (Plastic Surgery) Mauro Kaufmann, RN as Oncology Nurse Navigator Rockwell Germany, RN as Oncology Nurse Navigator Milus Banister, MD as Attending Physician (Gastroenterology) Ricard Dillon, MD as Consulting Physician (Internal Medicine)  DIAGNOSIS: No diagnosis found.  SUMMARY OF ONCOLOGIC HISTORY: Oncology History  Malignant neoplasm of upper-outer quadrant of left breast in female, estrogen receptor negative ([place])  06/19/2016 Initial Diagnosis   status post left breast upper outer quadrant and left axillary lymph node biopsy, both positive for a clinical T2-T3 N1, stage IIIB-C invasive ductal carcinoma, grade 3, triple negative, with an MIB-1 of 90%   07/20/2016 - 12/21/2016 Neo-Adjuvant Chemotherapy   neoadjuvant chemotherapy to consist of doxorubicin and cyclophosphamide in dose dense fashion 4 completed 09/10/2016 to be followed by paclitaxel weekly 12 given with carboplatin              (a) cycle 4 of cyclophosphamide and doxorubicin was delayed 10 days and dose decreased 10% because of febrile neutropenia after cycle 3             (b) cycle 6 of Paclitaxel and Carboplatin delayed due to neutropenia, therefore Granix added to Wednesday, Thursday, Friday following chemotherapy days   08/17/2016 Genetic Testing   BRCA2 c.8169T>A and MSH2 c.2050G>T VUS found on the common hereditary cancer panel.  The Hereditary Gene Panel offered by [facility] includes sequencing and/or deletion duplication testing of the following 46 genes: APC, ATM, AXIN2, BARD1, BMPR1A, BRCA1, BRCA2, BRIP1,  CDH1, CDKN2A (p14ARF), CDKN2A (p16INK4a), CHEK2, CTNNA1, DICER1, EPCAM (Deletion/duplication testing only), GREM1 (promoter region deletion/duplication testing only), KIT, MEN1, MLH1, MSH2, MSH3, MSH6, MUTYH, NBN, NF1, NHTL1, PALB2, PDGFRA, PMS2, POLD1, POLE, PTEN, RAD50, RAD51C, RAD51D, SDHB, SDHC, SDHD, SMAD4, SMARCA4. STK11, TP53, TSC1, TSC2, and VHL.  The following genes were evaluated for sequence changes only: SDHA and HOXB13 c.251G>A variant only.  The report date is August 17, 2016.    01/27/2017 Surgery   status post left lumpectomy and left axillary sentinel lymph node sampling with pathology showing a complete pathologic response (ypT0 ypN0); 5 left axillary lymph nodes removed      03/11/2017 - 04/21/2017 Radiation Therapy   adjuvant radiation 03/11/2017-04/21/2017  Site/dose:   1. Left breast, 2 Gy in 25 fractions for a total dose of 50 Gy                      2. Boost, 2 Gy in 5 fractions for a total dose of 10 Gy     02/19/2020 - 02/19/2020 Chemotherapy   Patient is on Treatment Plan : BREAST Adjuvant CMF IV q21d     Recurrent breast cancer, left ([place])  12/16/2019 Initial Diagnosis   Recurrent breast cancer, left ([place])   02/19/2020 - 02/19/2020 Chemotherapy   Patient is on Treatment Plan : BREAST Adjuvant CMF IV q21d     02/19/2020 Cancer Staging   Staging form: Breast, AJCC 8th Edition - Clinical: Stage Unknown (cT2, cNX, cM0, G2, ER+, PR-, HER2: Equivocal) - Signed by Chauncey Cruel, MD on 02/19/2020     CHIEF COMPLIANT: Triple negative breast cancer; 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 She presents to the clinic today for a follow-up.   ALLERGIES:  is allergic to carboplatin.  MEDICATIONS:  Current Outpatient Medications  Medication Sig Dispense Refill   anastrozole (ARIMIDEX) 1 MG tablet Take 1 tablet (1 mg total) by mouth daily. 90 tablet 4   docusate sodium  (COLACE) 100 MG capsule Take 100 mg by mouth daily.     gabapentin (NEURONTIN) 100 MG capsule Take 2 capsules (200 mg total) by mouth in the morning AND 2 capsules (200 mg total) daily in the afternoon. 240 capsule 3   gabapentin (NEURONTIN) 300 MG capsule Take 3 capsules (900 mg total) by mouth at bedtime. 180 capsule 4   olaparib (LYNPARZA) 150 MG tablet Take 2 tablets (300 mg total) by mouth 2 (two) times daily. Swallow whole. May take with food to decrease nausea and vomiting.Start May 23, 2020 120 tablet 6   potassium chloride SA (KLOR-CON M) 20 MEQ tablet Take 1 tablet (20 mEq total) by mouth daily. 30 tablet 2   traMADol (ULTRAM) 50 MG tablet Take 1 tablet by mouth every 12 hours as needed. 60 tablet 0   No current facility-administered medications for this visit.    PHYSICAL EXAMINATION: ECOG PERFORMANCE STATUS: {CHL ONC ECOG PS:[phone]}  There were no vitals filed for this visit. There were no vitals filed for this visit.  BREAST:*** No palpable masses or nodules in either right or left breasts. No palpable axillary supraclavicular or infraclavicular adenopathy no breast tenderness or nipple discharge. (exam performed in the presence of a chaperone)  LABORATORY DATA:  I have reviewed the data as listed    Latest Ref Rng & Units 05/27/2021   11:09 AM 03/02/2021   12:52 PM 02/25/2021    8:41 AM  CMP  Glucose 70 - 99 mg/dL 121  105  113   BUN 6 - 20 mg/dL 7  <5  8   Creatinine 0.44 - 1.00 mg/dL 0.80  0.72  0.81   Sodium 135 - 145 mmol/L 139  135  137   Potassium 3.5 - 5.1 mmol/L 3.6  3.2  3.6   Chloride 98 - 111 mmol/L 103  99  104   CO2 22 - 32 mmol/L _0 Calcium 8.9 - 10.3 mg/dL 9.4  8.2  9.1   Total Protein 6.5 - 8.1 g/dL 7.3  6.7  7.7   Total Bilirubin 0.3 - 1.2 mg/dL 0.9  0.8  0.9   Alkaline Phos 38 - 126 U/L 74  80  82   AST 15 - 41 U/L _1 ALT 0 - 44 U/L _2 Lab Results  Component Value Date   WBC 3.7 (L) 05/27/2021   HGB 12.8  05/27/2021   HCT 38.1 05/27/2021   MCV 89.6 05/27/2021   PLT 166 05/27/2021   NEUTROABS 2.3 05/27/2021    ASSESSMENT & PLAN:  No problem-specific Assessment & Plan notes found for this encounter.    No orders of the defined types were placed in this encounter.  The patient has a good understanding of the overall plan. she agrees with it. she will call with any problems that may develop before the next visit here. Total time spent: 30 mins including face to face time and time spent for planning, charting and co-ordination of care   [place],  CMA 10/07/21    I [name] Coins am scribing for Dr. Lindi Adie  ***

## 2021-10-09 ENCOUNTER — Inpatient Hospital Stay (HOSPITAL_BASED_OUTPATIENT_CLINIC_OR_DEPARTMENT_OTHER): Payer: Medicaid Other | Admitting: Hematology and Oncology

## 2021-10-09 ENCOUNTER — Other Ambulatory Visit: Payer: Self-pay

## 2021-10-09 ENCOUNTER — Inpatient Hospital Stay: Payer: Medicaid Other | Attending: Hematology and Oncology

## 2021-10-09 ENCOUNTER — Other Ambulatory Visit: Payer: Self-pay | Admitting: *Deleted

## 2021-10-09 DIAGNOSIS — Z79811 Long term (current) use of aromatase inhibitors: Secondary | ICD-10-CM | POA: Diagnosis not present

## 2021-10-09 DIAGNOSIS — Z923 Personal history of irradiation: Secondary | ICD-10-CM | POA: Diagnosis not present

## 2021-10-09 DIAGNOSIS — Z9221 Personal history of antineoplastic chemotherapy: Secondary | ICD-10-CM | POA: Diagnosis not present

## 2021-10-09 DIAGNOSIS — Z171 Estrogen receptor negative status [ER-]: Secondary | ICD-10-CM

## 2021-10-09 DIAGNOSIS — Z1501 Genetic susceptibility to malignant neoplasm of breast: Secondary | ICD-10-CM | POA: Diagnosis not present

## 2021-10-09 DIAGNOSIS — Z79899 Other long term (current) drug therapy: Secondary | ICD-10-CM | POA: Diagnosis not present

## 2021-10-09 DIAGNOSIS — Z170 Estrogen receptor positive status [ER+]: Secondary | ICD-10-CM | POA: Insufficient documentation

## 2021-10-09 DIAGNOSIS — Z9013 Acquired absence of bilateral breasts and nipples: Secondary | ICD-10-CM | POA: Diagnosis not present

## 2021-10-09 DIAGNOSIS — C50412 Malignant neoplasm of upper-outer quadrant of left female breast: Secondary | ICD-10-CM | POA: Diagnosis not present

## 2021-10-09 DIAGNOSIS — Z90722 Acquired absence of ovaries, bilateral: Secondary | ICD-10-CM | POA: Insufficient documentation

## 2021-10-09 DIAGNOSIS — Z853 Personal history of malignant neoplasm of breast: Secondary | ICD-10-CM | POA: Diagnosis not present

## 2021-10-09 DIAGNOSIS — C50912 Malignant neoplasm of unspecified site of left female breast: Secondary | ICD-10-CM | POA: Diagnosis present

## 2021-10-09 LAB — CBC WITH DIFFERENTIAL (CANCER CENTER ONLY)
Abs Immature Granulocytes: 0.02 10*3/uL (ref 0.00–0.07)
Basophils Absolute: 0 10*3/uL (ref 0.0–0.1)
Basophils Relative: 1 %
Eosinophils Absolute: 0.1 10*3/uL (ref 0.0–0.5)
Eosinophils Relative: 3 %
HCT: 40.5 % (ref 36.0–46.0)
Hemoglobin: 13.8 g/dL (ref 12.0–15.0)
Immature Granulocytes: 1 %
Lymphocytes Relative: 35 %
Lymphs Abs: 1.5 10*3/uL (ref 0.7–4.0)
MCH: 29.8 pg (ref 26.0–34.0)
MCHC: 34.1 g/dL (ref 30.0–36.0)
MCV: 87.5 fL (ref 80.0–100.0)
Monocytes Absolute: 0.3 10*3/uL (ref 0.1–1.0)
Monocytes Relative: 6 %
Neutro Abs: 2.3 10*3/uL (ref 1.7–7.7)
Neutrophils Relative %: 54 %
Platelet Count: 176 10*3/uL (ref 150–400)
RBC: 4.63 MIL/uL (ref 3.87–5.11)
RDW: 13.2 % (ref 11.5–15.5)
WBC Count: 4.3 10*3/uL (ref 4.0–10.5)
nRBC: 0 % (ref 0.0–0.2)

## 2021-10-09 LAB — CMP (CANCER CENTER ONLY)
ALT: 16 U/L (ref 0–44)
AST: 17 U/L (ref 15–41)
Albumin: 4.2 g/dL (ref 3.5–5.0)
Alkaline Phosphatase: 90 U/L (ref 38–126)
Anion gap: 6 (ref 5–15)
BUN: 7 mg/dL (ref 6–20)
CO2: 31 mmol/L (ref 22–32)
Calcium: 9.4 mg/dL (ref 8.9–10.3)
Chloride: 102 mmol/L (ref 98–111)
Creatinine: 0.72 mg/dL (ref 0.44–1.00)
GFR, Estimated: >60 mL/min (ref >60)
Glucose, Bld: 124 mg/dL — ABNORMAL HIGH (ref 70–99)
Potassium: 3.4 mmol/L — ABNORMAL LOW (ref 3.5–5.1)
Sodium: 139 mmol/L (ref 135–145)
Total Bilirubin: 0.8 mg/dL (ref 0.3–1.2)
Total Protein: 7.6 g/dL (ref 6.5–8.1)

## 2021-10-09 NOTE — Progress Notes (Signed)
Per MD request RN faxed [employer] testing on recent breast cancer diagnosis from 11/24/2019.

## 2021-10-09 NOTE — Assessment & Plan Note (Addendum)
06/19/2016:left breast upper outer quadrant and left axillary lymph node biopsy 06/19/2016, both positive for a clinical T2-T3 N1, stage IIIB-C invasive ductal carcinoma, grade 3, triple negative, with an MIB-1 of 90%, BRCA2 mutation 07/20/2016-09/10/2016: Neoadjuvant dose dense AC followed by Taxol carbo 01/27/2017: Left lumpectomy: Complete pathologic response 5 lymph nodes negative 03/11/2017-04/21/2017: Adjuvant radiation 11/24/2019: Left breast recurrence/new primary: Grade 3 IDC ER 80%, HER2 negative, PR negative, Ki-67 90% 01/2020: Bilateral mastectomies: Left: Stage IIb grade 3 IDC weak ER positive 02/19/2020 CMF x1 (stopped due to surgical wound dehiscence), foundation 1:BRCA2 mutation, a PTEN loss of exon 1 and RB1R 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 ERV B2 and PI K3 CA were negative, and the microsatellite status was stable, with low mutational burden (4/MB) Protracted healing from mastectomies Bilateral salpingo-oophorectomy March 2023   Current treatment: Anastrozole started 04/18/2020 Anastrozole toxicities: Body aches and pains.  Olaparib started 08/13/2020: Current dose 300 mg twice daily She completed 1 year of therapy and therefore we will discontinue it at this time.   Breast cancer surveillance: 1.  Breast exam 10/09/2021: Benign 2. no role of imaging since she had bilateral mastectomies.  I recommended Signatera testing for minimal residual disease. Return to clinic in 1 year for follow-up

## 2021-10-17 ENCOUNTER — Other Ambulatory Visit (HOSPITAL_COMMUNITY): Payer: Self-pay

## 2021-11-11 ENCOUNTER — Telehealth: Payer: Self-pay | Admitting: *Deleted

## 2021-11-11 LAB — SIGNATERA ONLY (NATERA MANAGED)
SIGNATERA MTM READOUT: 0 MTM/ml
SIGNATERA TEST RESULT: NEGATIVE

## 2021-11-11 NOTE — Telephone Encounter (Signed)
Per MD request, RN placed call to pt regarding negative (Not Detected) recent [employer] testing.  No answer, LVM for pt to return call to the office to review results.

## 2021-11-18 ENCOUNTER — Other Ambulatory Visit: Payer: Self-pay | Admitting: Hematology and Oncology

## 2021-11-19 ENCOUNTER — Other Ambulatory Visit (HOSPITAL_COMMUNITY): Payer: Self-pay

## 2021-11-20 ENCOUNTER — Encounter: Payer: Self-pay | Admitting: Hematology and Oncology

## 2021-11-20 ENCOUNTER — Other Ambulatory Visit (HOSPITAL_COMMUNITY): Payer: Self-pay

## 2021-11-20 MED ORDER — TRAMADOL HCL 50 MG PO TABS
50.0000 mg | ORAL_TABLET | Freq: Two times a day (BID) | ORAL | 0 refills | Status: DC | PRN
  Filled 2021-11-20: qty 60, 30d supply, fill #0

## 2021-11-21 ENCOUNTER — Other Ambulatory Visit (HOSPITAL_COMMUNITY): Payer: Self-pay

## 2021-11-29 ENCOUNTER — Emergency Department (HOSPITAL_BASED_OUTPATIENT_CLINIC_OR_DEPARTMENT_OTHER): Payer: Medicaid Other

## 2021-11-29 ENCOUNTER — Encounter (HOSPITAL_BASED_OUTPATIENT_CLINIC_OR_DEPARTMENT_OTHER): Payer: Self-pay | Admitting: Emergency Medicine

## 2021-11-29 ENCOUNTER — Other Ambulatory Visit: Payer: Self-pay

## 2021-11-29 ENCOUNTER — Emergency Department (HOSPITAL_BASED_OUTPATIENT_CLINIC_OR_DEPARTMENT_OTHER)
Admission: EM | Admit: 2021-11-29 | Discharge: 2021-11-29 | Disposition: A | Discharge status: Home / Self Care | Payer: Medicaid Other | Attending: Emergency Medicine | Admitting: Emergency Medicine

## 2021-11-29 DIAGNOSIS — R051 Acute cough: Secondary | ICD-10-CM | POA: Diagnosis not present

## 2021-11-29 DIAGNOSIS — Z1152 Encounter for screening for COVID-19: Secondary | ICD-10-CM | POA: Diagnosis not present

## 2021-11-29 DIAGNOSIS — R739 Hyperglycemia, unspecified: Secondary | ICD-10-CM | POA: Insufficient documentation

## 2021-11-29 DIAGNOSIS — R509 Fever, unspecified: Secondary | ICD-10-CM | POA: Insufficient documentation

## 2021-11-29 DIAGNOSIS — Z79899 Other long term (current) drug therapy: Secondary | ICD-10-CM | POA: Diagnosis not present

## 2021-11-29 DIAGNOSIS — C50919 Malignant neoplasm of unspecified site of unspecified female breast: Secondary | ICD-10-CM | POA: Insufficient documentation

## 2021-11-29 DIAGNOSIS — D72819 Decreased white blood cell count, unspecified: Secondary | ICD-10-CM | POA: Insufficient documentation

## 2021-11-29 DIAGNOSIS — I10 Essential (primary) hypertension: Secondary | ICD-10-CM | POA: Insufficient documentation

## 2021-11-29 LAB — TROPONIN I (HIGH SENSITIVITY): Troponin I (High Sensitivity): 5 ng/L (ref <18)

## 2021-11-29 LAB — URINALYSIS, ROUTINE W REFLEX MICROSCOPIC
Bilirubin Urine: NEGATIVE
Glucose, UA: NEGATIVE mg/dL
Ketones, ur: NEGATIVE mg/dL
Leukocytes,Ua: NEGATIVE
Nitrite: NEGATIVE
Protein, ur: NEGATIVE mg/dL
Specific Gravity, Urine: >=1.03 (ref 1.005–1.030)
pH: 5.5 (ref 5.0–8.0)

## 2021-11-29 LAB — BASIC METABOLIC PANEL
Anion gap: 8 (ref 5–15)
BUN: 6 mg/dL (ref 6–20)
CO2: 26 mmol/L (ref 22–32)
Calcium: 8.6 mg/dL — ABNORMAL LOW (ref 8.9–10.3)
Chloride: 101 mmol/L (ref 98–111)
Creatinine, Ser: 0.68 mg/dL (ref 0.44–1.00)
GFR, Estimated: >60 mL/min (ref >60)
Glucose, Bld: 107 mg/dL — ABNORMAL HIGH (ref 70–99)
Potassium: 3.7 mmol/L (ref 3.5–5.1)
Sodium: 135 mmol/L (ref 135–145)

## 2021-11-29 LAB — RAPID INFLUENZA A&B ANTIGENS
Influenza A (ARMC): NEGATIVE
Influenza B (ARMC): NEGATIVE

## 2021-11-29 LAB — URINALYSIS, MICROSCOPIC (REFLEX)

## 2021-11-29 LAB — SARS CORONAVIRUS 2 BY RT PCR: SARS Coronavirus 2 by RT PCR: NEGATIVE

## 2021-11-29 LAB — BRAIN NATRIURETIC PEPTIDE: B Natriuretic Peptide: 35.9 pg/mL (ref 0.0–100.0)

## 2021-11-29 MED ORDER — BENZONATATE 100 MG PO CAPS: 100.0000 mg | ORAL_CAPSULE | Freq: Three times a day (TID) | ORAL | 0 refills | Status: DC

## 2021-11-29 MED ORDER — ALBUTEROL SULFATE HFA 108 (90 BASE) MCG/ACT IN AERS
4.0000 | INHALATION_SPRAY | Freq: Once | RESPIRATORY_TRACT | Status: AC
  Administered 2021-11-29: 4 via RESPIRATORY_TRACT
  Filled 2021-11-29: qty 6.7

## 2021-11-29 MED ORDER — BENZONATATE 100 MG PO CAPS
100.0000 mg | ORAL_CAPSULE | Freq: Three times a day (TID) | ORAL | 0 refills | Status: DC
  Filled 2021-11-29: qty 21, 7d supply, fill #0

## 2021-11-29 MED ORDER — AEROCHAMBER PLUS FLO-VU MEDIUM MISC
1.0000 | Freq: Once | Status: AC
  Administered 2021-11-29: 1
  Filled 2021-11-29: qty 1

## 2021-11-29 NOTE — Discharge Instructions (Addendum)
It was a pleasure taking care of you today.  As discussed, all of your labs were reassuring.  Your chest x-ray did not show evidence of pneumonia.  Your COVID and flu test were negative.  I suspect you have a viral infection causing your symptoms.  You may take over-the-counter ibuprofen or Tylenol as needed for fever and bodyaches.  Please follow-up with PCP and/your oncologist if symptoms do not improve over the next few days.  Return to the ER for any worsening symptoms.

## 2021-11-29 NOTE — ED Triage Notes (Signed)
Patient presents with c/o fever, cough and body aches. Patient is taking chemo medication. Patient did home covid test that resulted as negative.

## 2021-11-29 NOTE — ED Provider Notes (Signed)
[facility] EMERGENCY DEPARTMENT Provider Note   CSN: [id number] Arrival date & time: 11/29/21  1149     History  Chief Complaint  Patient presents with   Fever    [name] is a 51 y.o. female with a past medical history significant for hypertension and breast cancer status post mastectomy currently on oral chemotherapy who presents to the ED due to fever, cough, and body aches.  Patient states symptoms started 5 days ago with body aches.  3 days ago she developed a cough and intermittent fever.  Tmax 101 F yesterday and today.  Denies abdominal pain, nausea, vomiting, and diarrhea.  Patient states her daughter is sick with similar symptoms however, was not around her prior to symptom onset.  No urinary symptoms.  She admits to some shortness of breath.  No history of asthma or COPD.  She admits to a cramping sensation in her chest only while coughing.  Denies cardiac history.  No lower extremity edema.  History obtained from patient and past medical records. No interpreter used during encounter.       Home Medications Prior to Admission medications   Medication Sig Start Date End Date Taking? Authorizing Provider  benzonatate (TESSALON) 100 MG capsule Take 1 capsule (100 mg total) by mouth every 8 (eight) hours. 11/29/21  Yes Sierrah Luevano C, PA-C  anastrozole (ARIMIDEX) 1 MG tablet Take 1 tablet (1 mg total) by mouth daily. 05/27/21   Nicholas Lose, MD  docusate sodium (COLACE) 100 MG capsule Take 100 mg by mouth daily.    [provider]  gabapentin (NEURONTIN) 100 MG capsule Take 2 capsules (200 mg total) by mouth in the morning AND 2 capsules (200 mg total) daily in the afternoon. 08/27/21   Nicholas Lose, MD  gabapentin (NEURONTIN) 300 MG capsule Take 3 capsules (900 mg total) by mouth at bedtime. 08/27/21   Nicholas Lose, MD  olaparib (LYNPARZA) 150 MG tablet Take 2 tablets (300 mg total) by mouth 2 (two) times daily. Swallow whole. May take with  food to decrease nausea and vomiting.Start May 23, 2020 10/30/20   Magrinat, [name], MD  potassium chloride SA (KLOR-CON M) 20 MEQ tablet Take 1 tablet (20 mEq total) by mouth daily. 09/11/21   Nicholas Lose, MD  traMADol (ULTRAM) 50 MG tablet Take 1 tablet (50 mg total) by mouth every 12 (twelve) hours as needed. 11/20/21 05/19/22  Nicholas Lose, MD  venlafaxine XR (EFFEXOR-XR) 75 MG 24 hr capsule Take 1 capsule (75 mg total) by mouth daily with breakfast. Patient not taking: Reported on 03/25/2020 02/12/20 03/28/20  Magrinat, [name], MD      Allergies    Carboplatin    Review of Systems   Review of Systems  Constitutional:  Positive for chills, fatigue and fever.  HENT:  Positive for congestion.   Respiratory:  Positive for cough and shortness of breath.   Cardiovascular:  Positive for chest pain. Negative for leg swelling.  Gastrointestinal:  Negative for abdominal pain, diarrhea, nausea and vomiting.  Genitourinary:  Negative for dysuria.  All other systems reviewed and are negative.   Physical Exam Updated Vital Signs BP 133/82   Pulse 88   Temp 99 F (37.2 C)   Resp 18   Ht 5' 7.5" (1.715 m)   Wt 113.9 kg   LMP 06/25/2016   SpO2 99%   BMI 38.73 kg/m  Physical Exam Vitals and nursing note reviewed.  Constitutional:      General:  She is not in acute distress.    Appearance: She is not ill-appearing.  HENT:     Head: Normocephalic.  Eyes:     Pupils: Pupils are equal, round, and reactive to light.  Cardiovascular:     Rate and Rhythm: Normal rate and regular rhythm.     Pulses: Normal pulses.     Heart sounds: Normal heart sounds. No murmur heard.    No friction rub. No gallop.  Pulmonary:     Effort: Pulmonary effort is normal.     Breath sounds: Wheezing and rhonchi present.  Abdominal:     General: Abdomen is flat. There is no distension.     Palpations: Abdomen is soft.     Tenderness: There is no abdominal tenderness. There is no guarding or rebound.   Musculoskeletal:        General: Normal range of motion.     Cervical back: Neck supple.  Skin:    General: Skin is warm and dry.  Neurological:     General: No focal deficit present.     Mental Status: She is alert.  Psychiatric:        Mood and Affect: Mood normal.        Behavior: Behavior normal.     ED Results / Procedures / Treatments   Labs (all labs ordered are listed, but only abnormal results are displayed) Labs Reviewed  CBC WITH DIFFERENTIAL/PLATELET - Abnormal; Notable for the following components:      Result Value   WBC 3.7 (*)    All other components within normal limits  BASIC METABOLIC PANEL - Abnormal; Notable for the following components:   Glucose, Bld 107 (*)    Calcium 8.6 (*)    All other components within normal limits  URINALYSIS, ROUTINE W REFLEX MICROSCOPIC - Abnormal; Notable for the following components:   APPearance HAZY (*)    Hgb urine dipstick SMALL (*)    All other components within normal limits  URINALYSIS, MICROSCOPIC (REFLEX) - Abnormal; Notable for the following components:   Bacteria, UA MANY (*)    All other components within normal limits  SARS CORONAVIRUS 2 BY RT PCR  RAPID INFLUENZA A&B ANTIGENS  URINE CULTURE  BRAIN NATRIURETIC PEPTIDE  TROPONIN I (HIGH SENSITIVITY)    EKG EKG Interpretation  Date/Time:  Saturday November 29 2021 16:06:16 EST Ventricular Rate:  87 PR Interval:  162 QRS Duration: 85 QT Interval:  351 QTC Calculation: 423 R Axis:   -3 Text Interpretation: Sinus rhythm Left ventricular hypertrophy No significant change since last tracing Confirmed by Leanord Asal ([id number]) on 11/29/2021 4:18:46 PM  Radiology DG Chest Portable 1 View  Result Date: 11/29/2021 CLINICAL DATA:  Shortness of breath, cough, fever EXAM: PORTABLE CHEST 1 VIEW COMPARISON:  03/02/2021 FINDINGS: Cardiomegaly. Both lungs are clear. The visualized skeletal structures are unremarkable. IMPRESSION: Cardiomegaly without acute  abnormality of the lungs in AP portable projection. Electronically Signed   By: Delanna Ahmadi M.D.   On: 11/29/2021 16:15    Procedures Procedures    Medications Ordered in ED Medications  albuterol (VENTOLIN HFA) 108 (90 Base) MCG/ACT inhaler 4 puff (4 puffs Inhalation Given 11/29/21 1633)  AeroChamber Plus Flo-Vu Medium MISC 1 each (1 each Other Given 11/29/21 1633)    ED Course/ Medical Decision Making/ A&P Clinical Course as of 11/29/21 1833  Sat Nov 29, 2021  1652 WBC(!): 3.7 [CA]  1652 Glucose(!): 107 [CA]  1827 Bacteria, UA(!): MANY [CA]    Clinical  Course User Index [CA] Suzy Bouchard, PA-C                           Medical Decision Making Amount and/or Complexity of Data Reviewed External Data Reviewed: notes.    Details: Oncology note Labs: ordered. Decision-making details documented in ED Course. Radiology: ordered and independent interpretation performed. Decision-making details documented in ED Course. ECG/medicine tests: ordered and independent interpretation performed. Decision-making details documented in ED Course.  Risk Prescription drug management.   This patient presents to the ED for concern of cough, nasal congestion, this involves an extensive number of treatment options, and is a complaint that carries with it a high risk of complications and morbidity.  The differential diagnosis includes COVID, flu, neutropenic fever, UTI, pneumonia, etc  51 year old female presents to the ED due to fever, body aches, chills, and cough for the past few days.  History of breast cancer currently on oral chemotherapy.  She also endorses shortness of breath and cramping sensation in chest only while coughing.  Upon arrival, patient borderline febrile at 99.6 F with otherwise unremarkable vitals.  Patient not tachycardic or hypoxic.  Patient in no acute distress.  Lungs with expiratory wheeze and rhonchi otherwise unremarkable exam.  Albuterol given.  Routine labs  given high risk of infection since patient is immunocompromised.  UA to rule out infection. COVID/influenza test.  Chest x-ray to rule out pneumonia.  COVID/influenza negative.  CBC significant for leukopenia at 3.7 otherwise unremarkable.  BMP significant for mild hyperglycemia at 107.  Normal renal function.  No major electrolyte derangements.  Chest x-ray personally reviewed interpreted shows cardiomegaly.  No infiltrates.  BNP and troponin added given cardiomegaly.  Upon reassessment, improvement in lung sounds. Patient able to ambulate in the ED and maintain O2 saturation above 95% without difficulty.  Troponin normal.  BNP normal.  Doubt CHF.  EKG demonstrates normal sinus rhythm.  No signs of acute ischemia.  Does demonstrate some left ventricular hypertrophy. Low suspicion for ACS, dissection, or PE. UA significant for small hematuria and many bacteria.  Given no dysuria, will hold on antibiotics pending urine culture.  Suspect possible viral etiology of symptoms. No evidence of sepsis. Patient discharged with symptomatic treatment.  Advised patient to follow-up with PCP/oncologist early next week if symptoms do not improve. Strict ED precautions discussed with patient. Patient states understanding and agrees to plan. Patient discharged home in no acute distress and stable vitals.  Discussed with Dr. Maylon Peppers who agrees with assessment and plan.       Final Clinical Impression(s) / ED Diagnoses Final diagnoses:  Febrile illness  Acute cough    Rx / DC Orders ED Discharge Orders          Ordered    benzonatate (TESSALON) 100 MG capsule  Every 8 hours        11/29/21 1831              Suzy Bouchard, PA-C 11/29/21 [place], [place], DO 11/29/21 2320

## 2021-11-29 NOTE — ED Notes (Signed)
Patient ambulated in hall and patient stated at 94%

## 2021-11-30 ENCOUNTER — Other Ambulatory Visit (HOSPITAL_COMMUNITY): Payer: Self-pay

## 2021-11-30 LAB — CBC WITH DIFFERENTIAL/PLATELET
Abs Immature Granulocytes: 0.01 10*3/uL (ref 0.00–0.07)
Basophils Absolute: 0 10*3/uL (ref 0.0–0.1)
Basophils Relative: 1 %
Eosinophils Absolute: 0.2 10*3/uL (ref 0.0–0.5)
Eosinophils Relative: 4 %
HCT: 38.8 % (ref 36.0–46.0)
Hemoglobin: 12.8 g/dL (ref 12.0–15.0)
Immature Granulocytes: 0 %
Lymphocytes Relative: 27 %
Lymphs Abs: 1 10*3/uL (ref 0.7–4.0)
MCH: 29.2 pg (ref 26.0–34.0)
MCHC: 33 g/dL (ref 30.0–36.0)
MCV: 88.6 fL (ref 80.0–100.0)
Monocytes Absolute: 0.4 10*3/uL (ref 0.1–1.0)
Monocytes Relative: 11 %
Neutro Abs: 2.1 10*3/uL (ref 1.7–7.7)
Neutrophils Relative %: 57 %
Platelets: 160 10*3/uL (ref 150–400)
RBC: 4.38 MIL/uL (ref 3.87–5.11)
RDW: 13.3 % (ref 11.5–15.5)
WBC: 3.7 10*3/uL — ABNORMAL LOW (ref 4.0–10.5)
nRBC: 0 % (ref 0.0–0.2)

## 2021-12-01 ENCOUNTER — Other Ambulatory Visit: Payer: Self-pay | Admitting: *Deleted

## 2021-12-01 ENCOUNTER — Ambulatory Visit
Admission: RE | Admit: 2021-12-01 | Discharge: 2021-12-01 | Disposition: A | Discharge status: Home / Self Care | Payer: Medicaid Other | Source: Ambulatory Visit | Attending: Physician Assistant | Admitting: Physician Assistant

## 2021-12-01 ENCOUNTER — Ambulatory Visit (INDEPENDENT_AMBULATORY_CARE_PROVIDER_SITE_OTHER): Payer: Medicaid Other

## 2021-12-01 ENCOUNTER — Ambulatory Visit: Payer: Self-pay

## 2021-12-01 VITALS — HR 96 | Temp 98.5°F | Resp 18

## 2021-12-01 DIAGNOSIS — R059 Cough, unspecified: Secondary | ICD-10-CM | POA: Diagnosis not present

## 2021-12-01 DIAGNOSIS — R0989 Other specified symptoms and signs involving the circulatory and respiratory systems: Secondary | ICD-10-CM

## 2021-12-01 DIAGNOSIS — J189 Pneumonia, unspecified organism: Secondary | ICD-10-CM

## 2021-12-01 DIAGNOSIS — Z171 Estrogen receptor negative status [ER-]: Secondary | ICD-10-CM

## 2021-12-01 LAB — URINE CULTURE

## 2021-12-01 MED ORDER — HYDROCOD POLI-CHLORPHE POLI ER 10-8 MG/5ML PO SUER: 5.0000 mL | Freq: Two times a day (BID) | ORAL | 0 refills | Status: DC | PRN

## 2021-12-01 MED ORDER — CEFTRIAXONE SODIUM 1 G IJ SOLR
1.0000 g | Freq: Once | INTRAMUSCULAR | Status: AC
  Administered 2021-12-01: 1 g via INTRAMUSCULAR

## 2021-12-01 MED ORDER — LEVOFLOXACIN 500 MG PO TABS: 500.0000 mg | ORAL_TABLET | Freq: Every day | ORAL | 0 refills | Status: AC

## 2021-12-01 NOTE — Telephone Encounter (Signed)
Patient has not been seen at [facility] since 5 years. Needs to re-establish care, new patient apt.  Would recommend UC or Mobile Unit.

## 2021-12-01 NOTE — Telephone Encounter (Signed)
  Chief Complaint: SOB Symptoms: SOB, wheezing, cough, fever Frequency: 11/27/21 Pertinent Negatives: NA Disposition: '[]'$ ED /'[x]'$ Urgent Care (no appt availability in office) / '[]'$ Appointment(In office/virtual)/ '[]'$  [facility]/ '[]'$ Home Care/ '[]'$ Refused Recommended Disposition /'[]'$ [facility]/ '[]'$  Follow-up with PCP Additional Notes: pt went to ED on 11/29/21, states gave her inhaler and cough med but nothing helping. Having to use inhaler q4h and not getting relief. Pt wasn't able to sleep and having coughing spells that are difficult to control. Advised no appts with practice. [facility] not available so scheduled UC appt today at 1500.   Reason for Disposition  [1] MODERATE difficulty breathing (e.g., speaks in phrases, SOB even at rest, pulse 100-120) AND [2] NEW-onset or WORSE than normal  Answer Assessment - Initial Assessment Questions 1. RESPIRATORY STATUS: "Describe your breathing?" (e.g., wheezing, shortness of breath, unable to speak, severe coughing)      SOB wheezing  2. ONSET: "When did this breathing problem begin?"      11/27/21 3. PATTERN "Does the difficult breathing come and go, or has it been constant since it started?"      Constant  4. SEVERITY: "How bad is your breathing?" (e.g., mild, moderate, severe)    - MILD: No SOB at rest, mild SOB with walking, speaks normally in sentences, can lie down, no retractions, pulse < 100.    - MODERATE: SOB at rest, SOB with minimal exertion and prefers to sit, cannot lie down flat, speaks in phrases, mild retractions, audible wheezing, pulse 100-120.    - SEVERE: Very SOB at rest, speaks in single words, struggling to breathe, sitting hunched forward, retractions, pulse > 120      Moderate  7. LUNG HISTORY: "Do you have any history of lung disease?"  (e.g., pulmonary embolus, asthma, emphysema)     no 9. OTHER SYMPTOMS: "Do you have any other symptoms? (e.g., dizziness, runny nose, cough, chest pain, fever)     Cough,  wheezing, fever,  Protocols used: Breathing Difficulty-A-AH

## 2021-12-01 NOTE — Telephone Encounter (Signed)
Noted  

## 2021-12-01 NOTE — Discharge Instructions (Addendum)
Follow up with your Physician for recheck.  Our radiologist has advised that you need followup imagining. This is to make sure that the area that looks like pneumonia resolves and to make sure you do not have a lung mass.

## 2021-12-01 NOTE — ED Provider Notes (Signed)
[facility]    CSN: [id number] Arrival date & time: 12/01/21  1457      History   Chief Complaint Chief Complaint  Patient presents with   Wheezing    wheezing and SOB with cough// Medicaid// [phone]// PEC NT - Entered by patient    HPI [name] is a 51 y.o. female.   Patient complains of a cough and congestion.  Patient reports she began feeling sick 4 days ago.  Patient was seen 2 days ago in the emergency department she had negative testing for flu and COVID.  Patient reports she had a chest x-ray and was told that she did not have pneumonia.  Patient reports that she felt her worse last night patient reports experiencing a temperature of 102.  Patient has a past medical history of breast cancer she is currently followed by Dr. Payton Mccallum  The history is provided by the patient. No language interpreter was used.  Cough Cough characteristics:  Productive Sputum characteristics:  Nondescript Severity:  Severe Onset quality:  Sudden Duration:  4 days Timing:  Constant Progression:  Worsening Chronicity:  New Relieved by:  Nothing Worsened by:  Nothing Associated symptoms: no fever     Past Medical History:  Diagnosis Date   Arthritis    Breast cancer ([place]) 64/68/0321   left    Complication of anesthesia    DAY SURGERY 2010 OR 2011 ASPIRATED AND STAYED OVERNIGHT   History of radiation therapy 03/11/17- 04/21/17   Left breast, 2 Gy in 25 fractions for a total dose of 50 Gy, Boost, 2 Gy in 5 fractions for a total dose of 10 Gy   Hypertension    OFF MEDS  SINCE CHEMO X 5 MONTHS   Malignant neoplasm of upper-outer quadrant of left breast in female, estrogen receptor negative ([place]) 06/23/2016    Patient Active Problem List   Diagnosis Date Noted   Incisional hernia 02/26/2021   S/P bilateral mastectomy 01/09/2020   Recurrent breast cancer, left ([place]) 22/48/2500   Umbilical hernia without obstruction and without gangrene 11/07/2019   BRCA2 gene  mutation positive in female 10/16/2019   Port-A-Cath in place 11/02/2016   Cellulitis 10/24/2016   Family history of breast cancer 10/08/2016   Genetic testing 09/21/2016   Sepsis ([place]) 08/27/2016   Rash 08/27/2016   Hypokalemia 08/27/2016   Neutropenic fever ([place]) 08/26/2016   Constipation 07/27/2016   Malignant neoplasm of upper-outer quadrant of left breast in female, estrogen receptor negative ([place]) 06/23/2016   Left breast mass 06/12/2016   Essential hypertension 06/12/2016   Class 3 severe obesity due to excess calories without serious comorbidity with body mass index (BMI) of 40.0 to 44.9 in adult ([place]) 06/12/2016   Low vitamin D level 06/12/2016   Irregular menses 06/12/2016    Past Surgical History:  Procedure Laterality Date   ANKLE SURGERY Right    BREAST CYST EXCISION Right 07/15/2020   Procedure: Excision excess right axillary tissue;  Surgeon: Cindra Presume, MD;  Location: [place];  Service: Plastics;  Laterality: Right;   BREAST LUMPECTOMY Left 01/27/2017   BREAST LUMPECTOMY WITH RADIOACTIVE SEED AND SENTINEL LYMPH NODE BIOPSY Left 01/27/2017   Procedure: LEFT BREAST LUMPECTOMY WITH RADIOACTIVE SEED LOCALIZATION, LEFT AXILLARY DEEP LYMPH NODE BIOPSY WITH RADIOACTIVE SEED LOCALIZATION, LEFT AXILLARY SENTINEL LYMPH NODE MAPPING AND BIOPSY WITH BLUE DYE INJECTION;  Surgeon: [name], MD;  Location: [place];  Service: General;  Laterality: Left;   COLONOSCOPY WITH PROPOFOL N/A 02/01/2020  Procedure: COLONOSCOPY WITH PROPOFOL;  Surgeon: Milus Banister, MD;  Location[facility];  Service: Endoscopy;  Laterality: N/A;   DEBRIDEMENT AND CLOSURE WOUND Left 03/28/2020   Procedure: DEBRIDEMENT AND CLOSURE OF MASTECTOMY WOUND;  Surgeon: Rolm Bookbinder, MD;  Location: [facility];  Service: General;  Laterality: Left;   DEBRIDEMENT AND CLOSURE WOUND Left 07/15/2020   Procedure: Debridement of left chest wound and rotational flap closure;  Surgeon: Cindra Presume, MD;   Location: [place];  Service: Plastics;  Laterality: Left;  1.5 hour   ENDOSCOPIC MUCOSAL RESECTION  02/01/2020   Procedure: ENDOSCOPIC MUCOSAL RESECTION;  Surgeon: Milus Banister, MD;  [facility];  Service: Endoscopy;;   HEMOSTASIS CLIP PLACEMENT  02/01/2020   Procedure: HEMOSTASIS CLIP PLACEMENT;  Surgeon: Milus Banister, MD;  [facility];  Service: Endoscopy;;   INCISIONAL HERNIA REPAIR N/A 02/26/2021   Procedure: LAPAROSCOPIC INCISIONAL HERNIA WITH MESH;  Surgeon: Rolm Bookbinder, MD;  [facility];  Service: General;  Laterality: N/A;   IR FLUORO GUIDE PORT INSERTION RIGHT  07/14/2016   IR US GUIDE VASC ACCESS RIGHT  07/14/2016   MASTECTOMY W/ SENTINEL NODE BIOPSY Bilateral 01/09/2020   Procedure: BILATERAL MASTECTOMY WITH LEFT AXILLARY SENTINEL LYMPH NODE BIOPSY;  Surgeon: Rolm Bookbinder, MD;  Location: [place];  Service: General;  Laterality: Bilateral;   PORT-A-CATH REMOVAL Right 01/27/2017   Procedure: REMOVAL PORT-A-CATH;  Surgeon: [name], MD;  Location: [place];  Service: General;  Laterality: Right;   PORT-A-CATH REMOVAL Right 02/26/2021   Procedure: REMOVAL PORT-A-CATH;  Surgeon: Rolm Bookbinder, MD;  [facility];  Service: General;  Laterality: Right;   PORTACATH PLACEMENT Right 01/09/2020   Procedure: INSERTION PORT-A-CATH;  Surgeon: Rolm Bookbinder, MD;  Location: [place];  Service: General;  Laterality: Right;   ROBOTIC ASSISTED TOTAL HYSTERECTOMY WITH BILATERAL SALPINGO OOPHERECTOMY N/A 02/26/2021   Procedure: XI ROBOTIC ASSISTED BILATERAL SALPINGO OOPHORECTOMY;  Surgeon: Lafonda Mosses, MD;  [facility];  Service: Gynecology;  Laterality: N/A;   SUBMUCOSAL LIFTING INJECTION  02/01/2020   Procedure: SUBMUCOSAL LIFTING INJECTION;  Surgeon: Milus Banister, MD;  [facility];  Service: Endoscopy;;   TUBAL LIGATION     WISDOM TOOTH EXTRACTION      OB History     Gravida   6   Para      Term      Preterm      AB  2   Living  4      SAB  2   IAB      Ectopic      Multiple      Live Births  4            Home Medications    Prior to Admission medications   Medication Sig Start Date End Date Taking? Authorizing Provider  chlorpheniramine-HYDROcodone (TUSSIONEX) 10-8 MG/5ML Take 5 mLs by mouth every 12 (twelve) hours as needed for cough. 12/01/21  Yes Caryl Ada K, PA-C  levofloxacin (LEVAQUIN) 500 MG tablet Take 1 tablet (500 mg total) by mouth daily for 10 days. 12/01/21 12/11/21 Yes Caryl Ada K, PA-C  anastrozole (ARIMIDEX) 1 MG tablet Take 1 tablet (1 mg total) by mouth daily. 05/27/21   Nicholas Lose, MD  benzonatate (TESSALON) 100 MG capsule Take 1 capsule (100 mg total) by mouth every 8 (eight) hours. 11/29/21   Suzy Bouchard, PA-C  docusate sodium (COLACE) 100 MG capsule Take 100 mg by mouth  daily.    [provider]  gabapentin (NEURONTIN) 100 MG capsule Take 2 capsules (200 mg total) by mouth in the morning AND 2 capsules (200 mg total) daily in the afternoon. 08/27/21   Nicholas Lose, MD  gabapentin (NEURONTIN) 300 MG capsule Take 3 capsules (900 mg total) by mouth at bedtime. 08/27/21   Nicholas Lose, MD  olaparib (LYNPARZA) 150 MG tablet Take 2 tablets (300 mg total) by mouth 2 (two) times daily. Swallow whole. May take with food to decrease nausea and vomiting.Start May 23, 2020 10/30/20   Magrinat, [name], MD  potassium chloride SA (KLOR-CON M) 20 MEQ tablet Take 1 tablet (20 mEq total) by mouth daily. 09/11/21   Nicholas Lose, MD  traMADol (ULTRAM) 50 MG tablet Take 1 tablet (50 mg total) by mouth every 12 (twelve) hours as needed. 11/20/21 05/19/22  Nicholas Lose, MD  venlafaxine XR (EFFEXOR-XR) 75 MG 24 hr capsule Take 1 capsule (75 mg total) by mouth daily with breakfast. Patient not taking: Reported on 03/25/2020 02/12/20 03/28/20  Magrinat, [name], MD    Family History Family History  Problem Relation  Age of Onset   Diabetes Mother    Hypertension Mother    Arthritis Mother    Colon cancer Father 22       died .86 metastatic at time of diagnosis   Diabetes Father    Heart disease Father    Hypertension Father    Breast cancer Maternal Grandmother 27       d.37s   Breast cancer Maternal Aunt 52   Cervical cancer Maternal Aunt 55   Breast cancer Maternal Aunt 46       d.50s   Cancer Maternal Uncle        d.62s unspecified type of cancer   Breast cancer Cousin 18       paternal first-cousin (daughter of unaffected aunt)   Cancer Maternal Aunt 63       "Female Cancer"   Cancer Maternal Aunt        unknown cancer   Brain cancer Daughter 32   Esophageal cancer Other    Colon polyps Neg Hx    Rectal cancer Neg Hx    Stomach cancer Neg Hx     Social History Social History   Tobacco Use   Smoking status: Never   Smokeless tobacco: Never  Vaping Use   Vaping Use: Never used  Substance Use Topics   Alcohol use: Yes    Comment: rare   Drug use: No     Allergies   Carboplatin   Review of Systems Review of Systems  Constitutional:  Negative for fever.  Respiratory:  Positive for cough.   All other systems reviewed and are negative.    Physical Exam Triage Vital Signs ED Triage Vitals [12/01/21 1521]  Enc Vitals Group     BP      Pulse Rate 96     Resp 18     Temp 98.5 F (36.9 C)     Temp Source Oral     SpO2 98 %     Weight      Height      Head Circumference      Peak Flow      Pain Score      Pain Loc      Pain Edu?      Excl. in [place]?    No data found.  Updated Vital Signs Pulse 96   Temp  98.5 F (36.9 C) (Oral)   Resp 18   LMP 06/25/2016   SpO2 98%   Visual Acuity Right Eye Distance:   Left Eye Distance:   Bilateral Distance:    Right Eye Near:   Left Eye Near:    Bilateral Near:     Physical Exam Vitals and nursing note reviewed.  Constitutional:      General: She is not in acute distress.    Appearance: She is  well-developed.  HENT:     Head: Normocephalic and atraumatic.  Eyes:     Conjunctiva/sclera: Conjunctivae normal.  Cardiovascular:     Rate and Rhythm: Normal rate and regular rhythm.     Heart sounds: No murmur heard. Pulmonary:     Effort: Pulmonary effort is normal. No respiratory distress.     Breath sounds: Wheezing and rhonchi present.  Abdominal:     Palpations: Abdomen is soft.     Tenderness: There is no abdominal tenderness.  Musculoskeletal:        General: No swelling.  Skin:    General: Skin is warm and dry.     Capillary Refill: Capillary refill takes less than 2 seconds.  Neurological:     Mental Status: She is alert.  Psychiatric:        Mood and Affect: Mood normal.      UC Treatments / Results  Labs (all labs ordered are listed, but only abnormal results are displayed) Labs Reviewed - No data to display  EKG   Radiology DG Chest 2 View  Result Date: 12/01/2021 CLINICAL DATA:  Cough and chest congestion. History of breast cancer. EXAM: CHEST - 2 VIEW COMPARISON:  11/29/2021, 03/02/2021 and CT chest 07/25/2016. FINDINGS: Trachea is midline. Heart size normal. Possible new rounded opacification in the inferior posterior chest on the lateral view, just anterior to the spine, not well seen on 03/02/2021. Finding is not well seen on the frontal view. Lungs are otherwise clear. No pleural fluid. IMPRESSION: New faint rounded opacity in the inferior posterior chest on the lateral view. If real, finding may be due to pneumonia. Difficult to exclude metastatic disease. Consider CT chest without contrast in further evaluation, as clinically indicated. Electronically Signed   By: Lorin Picket M.D.   On: 12/01/2021 15:56    Procedures Procedures (including critical care time)  Medications Ordered in UC Medications  cefTRIAXone (ROCEPHIN) injection 1 g (1 g Intramuscular Given 12/01/21 1630)    Initial Impression / Assessment and Plan / UC Course  I have  reviewed the triage vital signs and the nursing notes.  Pertinent labs & imaging results that were available during my care of the patient were reviewed by me and considered in my medical decision making (see chart for details).     MDM: Patient's chest x-ray shows a new faint rounded opacity in the inferior posterior chest on the lateral view.  The radiologist has advised follow-up CT scan.  Patient is advised of need for follow-up.  Patient is given an injection of Rocephin here I will start her on Levaquin for pneumonia.  Patient is given a prescription for Tussionex to help with the cough.  I will send copy of patient's chart to Dr. Payton Mccallum and to Dr. Wynetta Emery patient is advised to contact her primary to schedule follow-up Final Clinical Impressions(s) / UC Diagnoses   Final diagnoses:  Pneumonia due to infectious organism, unspecified laterality, unspecified part of lung     Discharge Instructions  Follow up with your Physician for recheck.  Our radiologist has advised that you need followup imagining. This is to make sure that the area that looks like pneumonia resolves and to make sure you do not have a lung mass.   ED Prescriptions     Medication Sig Dispense Auth. Provider   chlorpheniramine-HYDROcodone (TUSSIONEX) 10-8 MG/5ML Take 5 mLs by mouth every 12 (twelve) hours as needed for cough. 50 mL Pedram Goodchild K, PA-C   levofloxacin (LEVAQUIN) 500 MG tablet Take 1 tablet (500 mg total) by mouth daily for 10 days. 10 tablet Fransico Meadow, [place]      I have reviewed the PDMP during this encounter.       Fransico Meadow, [place] 12/01/21 1638

## 2021-12-01 NOTE — ED Triage Notes (Signed)
Pt presents with cough and chest congestion that started 3 days ago. Pt is taking chemo medication. She was prescribed tessolan pearls. Pt reports cough is worse at night.

## 2021-12-01 NOTE — Progress Notes (Signed)
Due to recent finding on Chest Xray verbal orders received from MD to obtain CT Chest for further evaluation.  Orders placed.  Appt will be scheduled once PA is obtained.

## 2021-12-04 ENCOUNTER — Ambulatory Visit (HOSPITAL_COMMUNITY)
Admission: RE | Admit: 2021-12-04 | Discharge: 2021-12-04 | Disposition: A | Discharge status: Home / Self Care | Payer: Medicaid Other | Source: Ambulatory Visit | Attending: Hematology and Oncology | Admitting: Hematology and Oncology

## 2021-12-04 DIAGNOSIS — Z171 Estrogen receptor negative status [ER-]: Secondary | ICD-10-CM | POA: Diagnosis present

## 2021-12-04 DIAGNOSIS — C50412 Malignant neoplasm of upper-outer quadrant of left female breast: Secondary | ICD-10-CM | POA: Insufficient documentation

## 2021-12-04 MED ORDER — SODIUM CHLORIDE (PF) 0.9 % IJ SOLN
INTRAMUSCULAR | Status: AC
  Filled 2021-12-04: qty 50

## 2021-12-04 MED ORDER — IOHEXOL 300 MG/ML  SOLN
75.0000 mL | Freq: Once | INTRAMUSCULAR | Status: AC | PRN
  Administered 2021-12-04: 75 mL via INTRAVENOUS

## 2021-12-06 NOTE — Progress Notes (Incomplete)
Patient Care Team: [name], MD as PCP - General (Internal Medicine) Magrinat, [name], MD (Inactive) as Consulting Physician (Oncology) [name], MD as Attending Physician (Radiation Oncology) Delice Bison, Charlestine Massed, NP as Nurse Practitioner (Hematology and Oncology) Rolm Bookbinder, MD as Consulting Physician (General Surgery) Pace, Steffanie Dunn, MD (Inactive) as Consulting Physician (Plastic Surgery) Mauro Kaufmann, RN as Oncology Nurse Navigator Rockwell Germany, RN as Oncology Nurse Navigator Milus Banister, MD as Attending Physician (Gastroenterology) Ricard Dillon, MD as Consulting Physician (Internal Medicine)  DIAGNOSIS: No diagnosis found.  SUMMARY OF ONCOLOGIC HISTORY: Oncology History  Malignant neoplasm of upper-outer quadrant of left breast in female, estrogen receptor negative ([place])  06/19/2016 Initial Diagnosis   status post left breast upper outer quadrant and left axillary lymph node biopsy, both positive for a clinical T2-T3 N1, stage IIIB-C invasive ductal carcinoma, grade 3, triple negative, with an MIB-1 of 90%   07/20/2016 - 12/21/2016 Neo-Adjuvant Chemotherapy   neoadjuvant chemotherapy to consist of doxorubicin and cyclophosphamide in dose dense fashion 4 completed 09/10/2016 to be followed by paclitaxel weekly 12 given with carboplatin              (a) cycle 4 of cyclophosphamide and doxorubicin was delayed 10 days and dose decreased 10% because of febrile neutropenia after cycle 3             (b) cycle 6 of Paclitaxel and Carboplatin delayed due to neutropenia, therefore Granix added to Wednesday, Thursday, Friday following chemotherapy days   08/17/2016 Genetic Testing   BRCA2 c.8169T>A and MSH2 c.2050G>T VUS found on the common hereditary cancer panel.  The Hereditary Gene Panel offered by [facility] includes sequencing and/or deletion duplication testing of the following 46 genes: APC, ATM, AXIN2, BARD1, BMPR1A, BRCA1, BRCA2, BRIP1,  CDH1, CDKN2A (p14ARF), CDKN2A (p16INK4a), CHEK2, CTNNA1, DICER1, EPCAM (Deletion/duplication testing only), GREM1 (promoter region deletion/duplication testing only), KIT, MEN1, MLH1, MSH2, MSH3, MSH6, MUTYH, NBN, NF1, NHTL1, PALB2, PDGFRA, PMS2, POLD1, POLE, PTEN, RAD50, RAD51C, RAD51D, SDHB, SDHC, SDHD, SMAD4, SMARCA4. STK11, TP53, TSC1, TSC2, and VHL.  The following genes were evaluated for sequence changes only: SDHA and HOXB13 c.251G>A variant only.  The report date is August 17, 2016.    01/27/2017 Surgery   status post left lumpectomy and left axillary sentinel lymph node sampling with pathology showing a complete pathologic response (ypT0 ypN0); 5 left axillary lymph nodes removed      03/11/2017 - 04/21/2017 Radiation Therapy   adjuvant radiation 03/11/2017-04/21/2017  Site/dose:   1. Left breast, 2 Gy in 25 fractions for a total dose of 50 Gy                      2. Boost, 2 Gy in 5 fractions for a total dose of 10 Gy     02/19/2020 - 02/19/2020 Chemotherapy   Patient is on Treatment Plan : BREAST Adjuvant CMF IV q21d     Recurrent breast cancer, left ([place])  12/16/2019 Initial Diagnosis   Recurrent breast cancer, left ([place])   02/19/2020 - 02/19/2020 Chemotherapy   Patient is on Treatment Plan : BREAST Adjuvant CMF IV q21d     02/19/2020 Cancer Staging   Staging form: Breast, AJCC 8th Edition - Clinical: Stage Unknown (cT2, cNX, cM0, G2, ER+, PR-, HER2: Equivocal) - Signed by Chauncey Cruel, MD on 02/19/2020     CHIEF COMPLIANT: Triple negative breast cancer; BRCA2 positive on olaparib on anastrozole/review scans  INTERVAL HISTORY: Sarah [place]  D Khan is a 51 y.o with the above mention Triple negative breast cancer; BRCA2 positive. She presents to the clinic today for a follow-up to review scans.   ALLERGIES:  is allergic to carboplatin.  MEDICATIONS:  Current Outpatient Medications  Medication Sig Dispense Refill   anastrozole (ARIMIDEX) 1 MG tablet Take 1 tablet (1 mg total) by  mouth daily. 90 tablet 4   benzonatate (TESSALON) 100 MG capsule Take 1 capsule (100 mg total) by mouth every 8 (eight) hours. 21 capsule 0   chlorpheniramine-HYDROcodone (TUSSIONEX) 10-8 MG/5ML Take 5 mLs by mouth every 12 (twelve) hours as needed for cough. 50 mL 0   docusate sodium (COLACE) 100 MG capsule Take 100 mg by mouth daily.     gabapentin (NEURONTIN) 100 MG capsule Take 2 capsules (200 mg total) by mouth in the morning AND 2 capsules (200 mg total) daily in the afternoon. 240 capsule 3   gabapentin (NEURONTIN) 300 MG capsule Take 3 capsules (900 mg total) by mouth at bedtime. 180 capsule 4   levofloxacin (LEVAQUIN) 500 MG tablet Take 1 tablet (500 mg total) by mouth daily for 10 days. 10 tablet 0   olaparib (LYNPARZA) 150 MG tablet Take 2 tablets (300 mg total) by mouth 2 (two) times daily. Swallow whole. May take with food to decrease nausea and vomiting.Start May 23, 2020 120 tablet 6   potassium chloride SA (KLOR-CON M) 20 MEQ tablet Take 1 tablet (20 mEq total) by mouth daily. 30 tablet 2   traMADol (ULTRAM) 50 MG tablet Take 1 tablet (50 mg total) by mouth every 12 (twelve) hours as needed. 60 tablet 0   No current facility-administered medications for this visit.    PHYSICAL EXAMINATION: ECOG PERFORMANCE STATUS: {CHL ONC ECOG PS:[phone]}  There were no vitals filed for this visit. There were no vitals filed for this visit.  BREAST:*** No palpable masses or nodules in either right or left breasts. No palpable axillary supraclavicular or infraclavicular adenopathy no breast tenderness or nipple discharge. (exam performed in the presence of a chaperone)  LABORATORY DATA:  I have reviewed the data as listed    Latest Ref Rng & Units 11/29/2021    4:21 PM 10/09/2021    2:33 PM 05/27/2021   11:09 AM  CMP  Glucose 70 - 99 mg/dL 107  124  121   BUN 6 - 20 mg/dL _0 Creatinine 0.44 - 1.00 mg/dL 0.68  0.72  0.80   Sodium 135 - 145 mmol/L 135  139  139   Potassium 3.5  - 5.1 mmol/L 3.7  3.4  3.6   Chloride 98 - 111 mmol/L 101  102  103   CO2 22 - 32 mmol/L _1 Calcium 8.9 - 10.3 mg/dL 8.6  9.4  9.4   Total Protein 6.5 - 8.1 g/dL  7.6  7.3   Total Bilirubin 0.3 - 1.2 mg/dL  0.8  0.9   Alkaline Phos 38 - 126 U/L  90  74   AST 15 - 41 U/L  17  17   ALT 0 - 44 U/L  16  12     Lab Results  Component Value Date   WBC 3.7 (L) 11/29/2021   HGB 12.8 11/29/2021   HCT 38.8 11/29/2021   MCV 88.6 11/29/2021   PLT 160 11/29/2021   NEUTROABS 2.1 11/29/2021    ASSESSMENT & PLAN:  No problem-specific Assessment & Plan notes  found for this encounter.    No orders of the defined types were placed in this encounter.  The patient has a good understanding of the overall plan. she agrees with it. she will call with any problems that may develop before the next visit here. Total time spent: 30 mins including face to face time and time spent for planning, charting and co-ordination of care   Suzzette Righter, [place] 12/06/21    I [name] Coins am scribing for Dr. Lindi Adie  ***

## 2021-12-08 ENCOUNTER — Ambulatory Visit: Payer: Medicaid Other | Admitting: Hematology and Oncology

## 2021-12-11 ENCOUNTER — Other Ambulatory Visit: Payer: Self-pay

## 2021-12-11 ENCOUNTER — Inpatient Hospital Stay: Payer: Medicaid Other | Attending: Hematology and Oncology | Admitting: Hematology and Oncology

## 2021-12-11 NOTE — Assessment & Plan Note (Deleted)
06/19/2016: left breast upper outer quadrant and left axillary lymph node biopsy 06/19/2016, both positive for a clinical T2-T3 N1, stage IIIB-C invasive ductal carcinoma, grade 3, triple negative, with an MIB-1 of 90%, BRCA2 mutation 07/20/2016-09/10/2016: Neoadjuvant dose dense AC followed by Taxol carbo 01/27/2017: Left lumpectomy: Complete pathologic response 5 lymph nodes negative 03/11/2017-04/21/2017: Adjuvant radiation 11/24/2019: Left breast recurrence/new primary: Grade 3 IDC ER 80%, HER2 negative, PR negative, Ki-67 90% 01/2020: Bilateral mastectomies: Left: Stage IIb grade 3 IDC weak ER positive 02/19/2020 CMF x1 (stopped due to surgical wound dehiscence), foundation 1: BRCA2 mutation, a PTEN loss of exon 1 and RB1R 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  ERV B2 and PI K3 CA were negative, and the microsatellite status was stable, with low mutational burden (4/MB) Protracted healing from mastectomies Bilateral salpingo-oophorectomy March 2023    Current treatment:  Anastrozole started 04/18/2020 Anastrozole toxicities: Body aches and pains.   Olaparib 08/13/2020-10/09/2021.    Breast cancer surveillance: 1.  Breast exam 12/11/2021: Benign 2. no role of imaging since she had bilateral mastectomies.   I recommended Signatera testing for minimal residual disease. Return to clinic in 1 year for follow-up

## 2021-12-25 ENCOUNTER — Other Ambulatory Visit: Payer: Self-pay | Admitting: Hematology and Oncology

## 2021-12-26 ENCOUNTER — Other Ambulatory Visit: Payer: Self-pay

## 2021-12-26 ENCOUNTER — Encounter: Payer: Self-pay | Admitting: Hematology and Oncology

## 2021-12-26 ENCOUNTER — Other Ambulatory Visit (HOSPITAL_COMMUNITY): Payer: Self-pay

## 2021-12-26 MED ORDER — TRAMADOL HCL 50 MG PO TABS
50.0000 mg | ORAL_TABLET | Freq: Two times a day (BID) | ORAL | 0 refills | Status: DC | PRN
  Filled 2021-12-26: qty 60, 30d supply, fill #0

## 2021-12-30 ENCOUNTER — Other Ambulatory Visit (HOSPITAL_COMMUNITY): Payer: Self-pay

## 2022-01-01 ENCOUNTER — Other Ambulatory Visit (HOSPITAL_COMMUNITY): Payer: Self-pay

## 2022-01-01 ENCOUNTER — Encounter: Payer: Self-pay | Admitting: Hematology and Oncology

## 2022-01-01 ENCOUNTER — Telehealth: Payer: Medicaid Other | Admitting: Physician Assistant

## 2022-01-01 DIAGNOSIS — B9689 Other specified bacterial agents as the cause of diseases classified elsewhere: Secondary | ICD-10-CM

## 2022-01-01 DIAGNOSIS — J208 Acute bronchitis due to other specified organisms: Secondary | ICD-10-CM | POA: Diagnosis not present

## 2022-01-01 MED ORDER — PREDNISONE 20 MG PO TABS
40.0000 mg | ORAL_TABLET | Freq: Every day | ORAL | 0 refills | Status: DC
  Filled 2022-01-01: qty 10, 5d supply, fill #0

## 2022-01-01 MED ORDER — BENZONATATE 100 MG PO CAPS
100.0000 mg | ORAL_CAPSULE | Freq: Three times a day (TID) | ORAL | 0 refills | Status: DC | PRN
  Filled 2022-01-01: qty 30, 10d supply, fill #0

## 2022-01-01 MED ORDER — DOXYCYCLINE HYCLATE 100 MG PO TABS
100.0000 mg | ORAL_TABLET | Freq: Two times a day (BID) | ORAL | 0 refills | Status: DC
  Filled 2022-01-01: qty 20, 10d supply, fill #0

## 2022-01-01 MED ORDER — PROMETHAZINE-DM 6.25-15 MG/5ML PO SYRP
5.0000 mL | ORAL_SOLUTION | Freq: Four times a day (QID) | ORAL | 0 refills | Status: DC | PRN
  Filled 2022-01-01: qty 60, 3d supply, fill #0
  Filled 2022-01-01: qty 58, 3d supply, fill #0
  Filled 2022-01-02: qty 58, 3d supply, fill #1

## 2022-01-01 NOTE — Progress Notes (Signed)
Virtual Visit Consent   [name], you are scheduled for a virtual visit with a [facility] [place] provider today. Just as with appointments in the office, your consent must be obtained to participate. Your consent will be active for this visit and any virtual visit you may have with one of our providers in the next 365 days. If you have a MyChart account, a copy of this consent can be sent to you electronically.  As this is a virtual visit, video technology does not allow for your provider to perform a traditional examination. This may limit your provider's ability to fully assess your condition. If your provider identifies any concerns that need to be evaluated in person or the need to arrange testing (such as labs, EKG, etc.), we will make arrangements to do so. Although advances in technology are sophisticated, we cannot ensure that it will always work on either your end or our end. If the connection with a video visit is poor, the visit may have to be switched to a telephone visit. With either a video or telephone visit, we are not always able to ensure that we have a secure connection.  By engaging in this virtual visit, you consent to the provision of healthcare and authorize for your insurance to be billed (if applicable) for the services provided during this visit. Depending on your insurance coverage, you may receive a charge related to this service.  I need to obtain your verbal consent now. Are you willing to proceed with your visit today? [name] has provided verbal consent on 01/01/2022 for a virtual visit (video or telephone). [name], PA-C  Date: 01/01/2022 4:10 PM  Virtual Visit via Video Note   IMar Daring, connected with  [name]  ([id number], 1970-10-13) on 01/01/22 at  4:00 PM EST by a video-enabled telemedicine application and verified that I am speaking with the correct person using two identifiers.  Location: Patient: Virtual Visit  Location Patient: Home Provider: Virtual Visit Location Provider: Home Office   I discussed the limitations of evaluation and management by telemedicine and the availability of in person appointments. The patient expressed understanding and agreed to proceed.    History of Present Illness: [name] is a 51 y.o. who identifies as a female who was assigned female at birth, and is being seen today for cough.  HPI: Cough This is a new problem. The problem has been gradually worsening. The problem occurs constantly. The cough is Non-productive. Associated symptoms include a fever, headaches, myalgias and wheezing. Pertinent negatives include no chills, nasal congestion, postnasal drip, rhinorrhea or sore throat. The symptoms are aggravated by lying down. She has tried a beta-agonist inhaler (vicks 44, albuterol, tylenol, motrin) for the symptoms. The treatment provided no relief. Her past medical history is significant for bronchitis and pneumonia.    Was seen at ER and UC on 11/29/21 and 12/01/21, respectively. Diagnosed with pneumonia. Treated with Levaquin 577m x 10 days. Reports had symptom improvement over 3 weeks. At home covid 19 testing was negative.   Problems:  Patient Active Problem List   Diagnosis Date Noted   Incisional hernia 02/26/2021   S/P bilateral mastectomy 01/09/2020   Recurrent breast cancer, left ([facility] 183/15/1761  Umbilical hernia without obstruction and without gangrene 11/07/2019   BRCA2 gene mutation positive in female 10/16/2019   Port-A-Cath in place 11/02/2016   Cellulitis 10/24/2016   Family history of breast cancer 10/08/2016   Genetic testing  09/21/2016   Sepsis ([place]) 08/27/2016   Rash 08/27/2016   Hypokalemia 08/27/2016   Neutropenic fever ([place]) 08/26/2016   Constipation 07/27/2016   Malignant neoplasm of upper-outer quadrant of left breast in female, estrogen receptor negative ([place]) 06/23/2016   Left breast mass 06/12/2016   Essential  hypertension 06/12/2016   Class 3 severe obesity due to excess calories without serious comorbidity with body mass index (BMI) of 40.0 to 44.9 in adult ([place]) 06/12/2016   Low vitamin D level 06/12/2016   Irregular menses 06/12/2016    Allergies:  Allergies  Allergen Reactions   Carboplatin Other (See Comments)    Red, blotchy, hot   Medications:  Current Outpatient Medications:    benzonatate (TESSALON) 100 MG capsule, Take 1 capsule (100 mg total) by mouth 3 (three) times daily as needed., Disp: 30 capsule, Rfl: 0   doxycycline (VIBRA-TABS) 100 MG tablet, Take 1 tablet (100 mg total) by mouth 2 (two) times daily., Disp: 20 tablet, Rfl: 0   predniSONE (DELTASONE) 20 MG tablet, Take 2 tablets (40 mg total) by mouth daily with breakfast., Disp: 10 tablet, Rfl: 0   promethazine-dextromethorphan (PROMETHAZINE-DM) 6.25-15 MG/5ML syrup, Take 5 mLs by mouth 4 (four) times daily as needed., Disp: 118 mL, Rfl: 0   anastrozole (ARIMIDEX) 1 MG tablet, Take 1 tablet (1 mg total) by mouth daily., Disp: 90 tablet, Rfl: 4   docusate sodium (COLACE) 100 MG capsule, Take 100 mg by mouth daily., Disp: , Rfl:    gabapentin (NEURONTIN) 100 MG capsule, Take 2 capsules (200 mg total) by mouth in the morning AND 2 capsules (200 mg total) daily in the afternoon., Disp: 240 capsule, Rfl: 3   gabapentin (NEURONTIN) 300 MG capsule, Take 3 capsules (900 mg total) by mouth at bedtime., Disp: 180 capsule, Rfl: 4   olaparib (LYNPARZA) 150 MG tablet, Take 2 tablets (300 mg total) by mouth 2 (two) times daily. Swallow whole. May take with food to decrease nausea and vomiting.Start May 23, 2020, Disp: 120 tablet, Rfl: 6   potassium chloride SA (KLOR-CON M) 20 MEQ tablet, Take 1 tablet (20 mEq total) by mouth daily., Disp: 30 tablet, Rfl: 2   traMADol (ULTRAM) 50 MG tablet, Take 1 tablet (50 mg total) by mouth every 12 (twelve) hours as needed., Disp: 60 tablet, Rfl: 0  Observations/Objective: Patient is well-developed,  well-nourished in no acute distress.  Resting comfortably at home.  Head is normocephalic, atraumatic.  No labored breathing.  Speech is clear and coherent with logical content.  Patient is alert and oriented at baseline.    Assessment and Plan: 1. Acute bacterial bronchitis - doxycycline (VIBRA-TABS) 100 MG tablet; Take 1 tablet (100 mg total) by mouth 2 (two) times daily.  Dispense: 20 tablet; Refill: 0 - promethazine-dextromethorphan (PROMETHAZINE-DM) 6.25-15 MG/5ML syrup; Take 5 mLs by mouth 4 (four) times daily as needed.  Dispense: 118 mL; Refill: 0 - benzonatate (TESSALON) 100 MG capsule; Take 1 capsule (100 mg total) by mouth 3 (three) times daily as needed.  Dispense: 30 capsule; Refill: 0 - predniSONE (DELTASONE) 20 MG tablet; Take 2 tablets (40 mg total) by mouth daily with breakfast.  Dispense: 10 tablet; Refill: 0  - Worsening over a week despite OTC medications - Will treat with Doxycycline, Prednisone, Promethazine DM and tessalon perles - Can continue Mucinex  - Push fluids.  - Rest.  - Steam and humidifier can help - Seek in person evaluation if worsening or symptoms fail to improve    Follow  Up Instructions: I discussed the assessment and treatment plan with the patient. The patient was provided an opportunity to ask questions and all were answered. The patient agreed with the plan and demonstrated an understanding of the instructions.  A copy of instructions were sent to the patient via MyChart unless otherwise noted below.    The patient was advised to call back or seek an in-person evaluation if the symptoms worsen or if the condition fails to improve as anticipated.  Time:  I spent 10 minutes with the patient via telehealth technology discussing the above problems/concerns.    [name], PA-C

## 2022-01-01 NOTE — Patient Instructions (Signed)
Duanne Moron, thank you for joining [name], PA-C for today's virtual visit.  While this provider is not your primary care provider (PCP), if your PCP is located in our provider database this encounter information will be shared with them immediately following your visit.   [facility] account gives you access to today's visit and all your visits, tests, and labs performed at [facility] " click here if you don't have a [facility] account or go to [email or web address]  Consent: (Patient) [name] provided verbal consent for this virtual visit at the beginning of the encounter.  Current Medications:  Current Outpatient Medications:    benzonatate (TESSALON) 100 MG capsule, Take 1 capsule (100 mg total) by mouth 3 (three) times daily as needed., Disp: 30 capsule, Rfl: 0   doxycycline (VIBRA-TABS) 100 MG tablet, Take 1 tablet (100 mg total) by mouth 2 (two) times daily., Disp: 20 tablet, Rfl: 0   predniSONE (DELTASONE) 20 MG tablet, Take 2 tablets (40 mg total) by mouth daily with breakfast., Disp: 10 tablet, Rfl: 0   promethazine-dextromethorphan (PROMETHAZINE-DM) 6.25-15 MG/5ML syrup, Take 5 mLs by mouth 4 (four) times daily as needed., Disp: 118 mL, Rfl: 0   anastrozole (ARIMIDEX) 1 MG tablet, Take 1 tablet (1 mg total) by mouth daily., Disp: 90 tablet, Rfl: 4   docusate sodium (COLACE) 100 MG capsule, Take 100 mg by mouth daily., Disp: , Rfl:    gabapentin (NEURONTIN) 100 MG capsule, Take 2 capsules (200 mg total) by mouth in the morning AND 2 capsules (200 mg total) daily in the afternoon., Disp: 240 capsule, Rfl: 3   gabapentin (NEURONTIN) 300 MG capsule, Take 3 capsules (900 mg total) by mouth at bedtime., Disp: 180 capsule, Rfl: 4   olaparib (LYNPARZA) 150 MG tablet, Take 2 tablets (300 mg total) by mouth 2 (two) times daily. Swallow whole. May take with food to decrease nausea and vomiting.Start May 23, 2020, Disp: 120 tablet, Rfl:  6   potassium chloride SA (KLOR-CON M) 20 MEQ tablet, Take 1 tablet (20 mEq total) by mouth daily., Disp: 30 tablet, Rfl: 2   traMADol (ULTRAM) 50 MG tablet, Take 1 tablet (50 mg total) by mouth every 12 (twelve) hours as needed., Disp: 60 tablet, Rfl: 0   Medications ordered in this encounter:  Meds ordered this encounter  Medications   doxycycline (VIBRA-TABS) 100 MG tablet    Sig: Take 1 tablet (100 mg total) by mouth 2 (two) times daily.    Dispense:  20 tablet    Refill:  0    Order Specific Question:   Supervising Provider    Answer:   Chase Picket [id number]   promethazine-dextromethorphan (PROMETHAZINE-DM) 6.25-15 MG/5ML syrup    Sig: Take 5 mLs by mouth 4 (four) times daily as needed.    Dispense:  118 mL    Refill:  0    Order Specific Question:   Supervising Provider    Answer:   Chase Picket [[id number]]   benzonatate (TESSALON) 100 MG capsule    Sig: Take 1 capsule (100 mg total) by mouth 3 (three) times daily as needed.    Dispense:  30 capsule    Refill:  0    Order Specific Question:   Supervising Provider    Answer:   Chase Picket [[id number]]   predniSONE (DELTASONE) 20 MG tablet    Sig: Take 2 tablets (40 mg total) by mouth daily with breakfast.  Dispense:  10 tablet    Refill:  0    Order Specific Question:   Supervising Provider    Answer:   Chase Picket [id number]     *If you need refills on other medications prior to your next appointment, please contact your pharmacy*  Follow-Up: Call back or seek an in-person evaluation if the symptoms worsen or if the condition fails to improve as anticipated.  [place] [phone]  Other Instructions Acute Bronchitis, Adult  Acute bronchitis is sudden inflammation of the main airways (bronchi) that come off the windpipe (trachea) in the lungs. The swelling causes the airways to get smaller and make more mucus than normal. This can make it hard to breathe and can cause coughing or  noisy breathing (wheezing). Acute bronchitis may last several weeks. The cough may last longer. Allergies, asthma, and exposure to smoke may make the condition worse. What are the causes? This condition can be caused by germs and by substances that irritate the lungs, including: Cold and flu viruses. The most common cause of this condition is the virus that causes the common cold. Bacteria. This is less common. Breathing in substances that irritate the lungs, including: Smoke from cigarettes and other forms of tobacco. Dust and pollen. Fumes from household cleaning products, gases, or burned fuel. Indoor or outdoor air pollution. What increases the risk? The following factors may make you more likely to develop this condition: A weak body's defense system, also called the immune system. A condition that affects your lungs and breathing, such as asthma. What are the signs or symptoms? Common symptoms of this condition include: Coughing. This may bring up clear, yellow, or green mucus from your lungs (sputum). Wheezing. Runny or stuffy nose. Having too much mucus in your lungs (chest congestion). Shortness of breath. Aches and pains, including sore throat or chest. How is this diagnosed? This condition is usually diagnosed based on: Your symptoms and medical history. A physical exam. You may also have other tests, including tests to rule out other conditions, such as pneumonia. These tests include: A test of lung function. Test of a mucus sample to look for the presence of bacteria. Tests to check the oxygen level in your blood. Blood tests. Chest X-ray. How is this treated? Most cases of acute bronchitis clear up over time without treatment. Your health care provider may recommend: Drinking more fluids to help thin your mucus so it is easier to cough up. Taking inhaled medicine (inhaler) to improve air flow in and out of your lungs. Using a vaporizer or a humidifier. These are  machines that add water to the air to help you breathe better. Taking a medicine that thins mucus and clears congestion (expectorant). Taking a medicine that prevents or stops coughing (cough suppressant). It is not common to take an antibiotic medicine for this condition. Follow these instructions at home:  Take over-the-counter and prescription medicines only as told by your health care provider. Use an inhaler, vaporizer, or humidifier as told by your health care provider. Take two teaspoons (10 mL) of honey at bedtime to lessen coughing at night. Drink enough fluid to keep your urine pale yellow. Do not use any products that contain nicotine or tobacco. These products include cigarettes, chewing tobacco, and vaping devices, such as e-cigarettes. If you need help quitting, ask your health care provider. Get plenty of rest. Return to your normal activities as told by your health care provider. Ask your health care provider  what activities are safe for you. Keep all follow-up visits. This is important. How is this prevented? To lower your risk of getting this condition again: Wash your hands often with soap and water for at least 20 seconds. If soap and water are not available, use hand sanitizer. Avoid contact with people who have cold symptoms. Try not to touch your mouth, nose, or eyes with your hands. Avoid breathing in smoke or chemical fumes. Breathing smoke or chemical fumes will make your condition worse. Get the flu shot every year. Contact a health care provider if: Your symptoms do not improve after 2 weeks. You have trouble coughing up the mucus. Your cough keeps you awake at night. You have a fever. Get help right away if you: Cough up blood. Feel pain in your chest. Have severe shortness of breath. Faint or keep feeling like you are going to faint. Have a severe headache. Have a fever or chills that get worse. These symptoms may represent a serious problem that is an  emergency. Do not wait to see if the symptoms will go away. Get medical help right away. Call your local emergency services (911 in the [place] Do not drive yourself to the hospital. Summary Acute bronchitis is inflammation of the main airways (bronchi) that come off the windpipe (trachea) in the lungs. The swelling causes the airways to get smaller and make more mucus than normal. Drinking more fluids can help thin your mucus so it is easier to cough up. Take over-the-counter and prescription medicines only as told by your health care provider. Do not use any products that contain nicotine or tobacco. These products include cigarettes, chewing tobacco, and vaping devices, such as e-cigarettes. If you need help quitting, ask your health care provider. Contact a health care provider if your symptoms do not improve after 2 weeks. This information is not intended to replace advice given to you by your health care provider. Make sure you discuss any questions you have with your health care provider. Document Revised: 04/03/2021 Document Reviewed: 04/24/2020 Elsevier Patient Education  [place].    If you have been instructed to have an in-person evaluation today at a local Urgent Care facility, please use the link below. It will take you to a list of all of our available [facility], including address, phone number and hours of operation. Please do not delay care.  [facility]  If you or a family member do not have a primary care provider, use the link below to schedule a visit and establish care. When you choose a [facility] primary care physician or advanced practice provider, you gain a long-term partner in health. Find a Primary Care Provider  Learn more about [facility] in-office and virtual care options: [facility]

## 2022-01-02 ENCOUNTER — Other Ambulatory Visit: Payer: Self-pay

## 2022-01-02 ENCOUNTER — Encounter: Payer: Self-pay | Admitting: Hematology and Oncology

## 2022-01-02 ENCOUNTER — Other Ambulatory Visit (HOSPITAL_COMMUNITY): Payer: Self-pay

## 2022-01-07 ENCOUNTER — Other Ambulatory Visit (HOSPITAL_COMMUNITY): Payer: Self-pay

## 2022-01-09 ENCOUNTER — Other Ambulatory Visit: Payer: Self-pay

## 2022-02-10 IMAGING — RF DG FLUORO GUIDE CV LINE
1 series · 1 of 1 positions shown · IV contrast (agent unspecified)
Comparison: None.

CLINICAL DATA: 49-year-old female with surgical port catheter

EXAM:
CENTRAL VENOUS CATHETER WITH FLUOROSCOPY
CONTRAST:  Op note
FLUOROSCOPY TIME:  1 minutes

[Series 1: run · 1 of 1 slices shown]
[im 1/1]
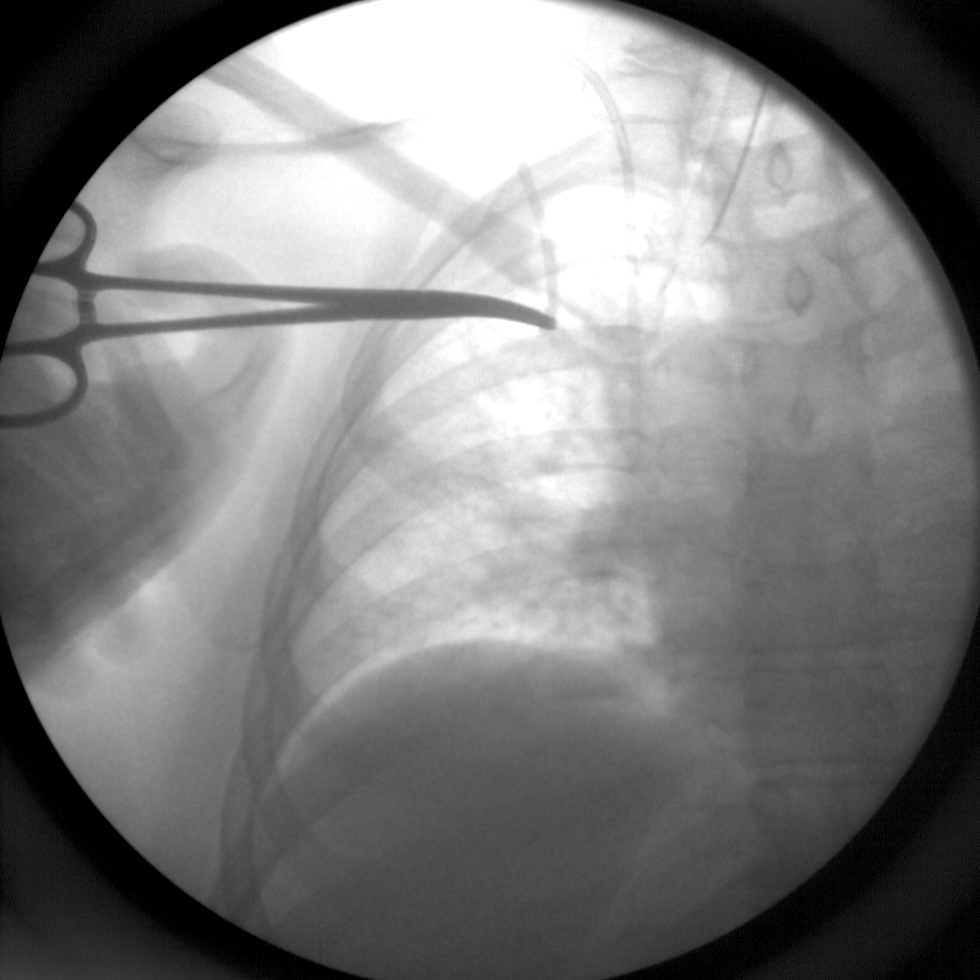

[1 of 1 positions shown; findings below may reference images not displayed]

FINDINGS: Single intraoperative fluoroscopic spot image of the right chest
demonstrates port catheter from right IJ approach with hemostat
projecting in the subclavian region.
IMPRESSION: Limited images in the operating room of the right chest
demonstrating catheter from right IJ approach. Please refer to the
dictated operative report for full details of intraoperative
findings and procedure.

## 2022-02-11 LAB — SIGNATERA

## 2022-02-24 ENCOUNTER — Telehealth: Payer: Self-pay | Admitting: *Deleted

## 2022-02-24 NOTE — Telephone Encounter (Signed)
Per MD request, RN placed call to pt regarding negative (Not Detected) recent [employer] testing.  Pt appreciative of call and verbalized understanding.  

## 2022-02-25 ENCOUNTER — Encounter: Payer: Self-pay | Admitting: Hematology and Oncology

## 2022-03-04 ENCOUNTER — Other Ambulatory Visit: Payer: Self-pay | Admitting: Hematology and Oncology

## 2022-03-04 DIAGNOSIS — E876 Hypokalemia: Secondary | ICD-10-CM

## 2022-03-05 ENCOUNTER — Encounter: Payer: Self-pay | Admitting: Hematology and Oncology

## 2022-03-05 ENCOUNTER — Other Ambulatory Visit: Payer: Self-pay

## 2022-03-05 ENCOUNTER — Other Ambulatory Visit (HOSPITAL_COMMUNITY): Payer: Self-pay

## 2022-03-05 MED ORDER — TRAMADOL HCL 50 MG PO TABS
50.0000 mg | ORAL_TABLET | Freq: Two times a day (BID) | ORAL | 0 refills | Status: DC | PRN
  Filled 2022-03-05 – 2022-03-19 (×2): qty 60, 30d supply, fill #0

## 2022-03-05 MED ORDER — POTASSIUM CHLORIDE CRYS ER 20 MEQ PO TBCR
20.0000 meq | EXTENDED_RELEASE_TABLET | Freq: Every day | ORAL | 2 refills | Status: DC
  Filled 2022-03-05 – 2022-03-19 (×2): qty 30, 30d supply, fill #0
  Filled 2022-04-30: qty 30, 30d supply, fill #1
  Filled 2022-06-27 – 2022-08-01 (×2): qty 30, 30d supply, fill #2

## 2022-03-09 LAB — SIGNATERA
SIGNATERA MTM READOUT: 0 MTM/ml
SIGNATERA TEST RESULT: NEGATIVE

## 2022-03-13 ENCOUNTER — Other Ambulatory Visit (HOSPITAL_COMMUNITY): Payer: Self-pay

## 2022-03-19 ENCOUNTER — Encounter: Payer: Self-pay | Admitting: Hematology and Oncology

## 2022-03-19 ENCOUNTER — Other Ambulatory Visit (HOSPITAL_COMMUNITY): Payer: Self-pay

## 2022-04-03 ENCOUNTER — Other Ambulatory Visit (HOSPITAL_COMMUNITY): Payer: Self-pay

## 2022-04-30 ENCOUNTER — Other Ambulatory Visit: Payer: Self-pay

## 2022-04-30 ENCOUNTER — Other Ambulatory Visit: Payer: Self-pay | Admitting: Hematology and Oncology

## 2022-05-04 ENCOUNTER — Encounter: Payer: Self-pay | Admitting: Hematology and Oncology

## 2022-05-04 ENCOUNTER — Other Ambulatory Visit (HOSPITAL_COMMUNITY): Payer: Self-pay

## 2022-05-04 ENCOUNTER — Other Ambulatory Visit: Payer: Self-pay

## 2022-05-04 MED ORDER — GABAPENTIN 100 MG PO CAPS
ORAL_CAPSULE | ORAL | 3 refills | Status: DC
  Filled 2022-05-04 (×2): qty 240, 60d supply, fill #0
  Filled 2022-08-01: qty 240, 60d supply, fill #1
  Filled 2022-09-06: qty 240, 60d supply, fill #2

## 2022-05-04 MED ORDER — TRAMADOL HCL 50 MG PO TABS
50.0000 mg | ORAL_TABLET | Freq: Two times a day (BID) | ORAL | 0 refills | Status: DC | PRN
  Filled 2022-05-04 (×2): qty 60, 30d supply, fill #0

## 2022-05-07 ENCOUNTER — Encounter: Payer: Self-pay | Admitting: *Deleted

## 2022-05-07 NOTE — Progress Notes (Signed)
Received message from [facility] team stating pt unresponsive for 04/17/22 mobile draw.

## 2022-06-04 ENCOUNTER — Other Ambulatory Visit (HOSPITAL_COMMUNITY): Payer: Self-pay

## 2022-06-27 ENCOUNTER — Other Ambulatory Visit: Payer: Self-pay | Admitting: Hematology and Oncology

## 2022-06-29 ENCOUNTER — Other Ambulatory Visit (HOSPITAL_COMMUNITY): Payer: Self-pay

## 2022-06-29 ENCOUNTER — Encounter: Payer: Self-pay | Admitting: Hematology and Oncology

## 2022-06-29 ENCOUNTER — Other Ambulatory Visit: Payer: Self-pay

## 2022-06-29 MED ORDER — TRAMADOL HCL 50 MG PO TABS
50.0000 mg | ORAL_TABLET | Freq: Two times a day (BID) | ORAL | 0 refills | Status: DC | PRN
  Filled 2022-06-29: qty 60, 30d supply, fill #0

## 2022-06-29 MED ORDER — ANASTROZOLE 1 MG PO TABS
1.0000 mg | ORAL_TABLET | Freq: Every day | ORAL | 4 refills | Status: DC
  Filled 2022-06-29: qty 90, 90d supply, fill #0
  Filled 2022-08-01 – 2023-02-03 (×4): qty 90, 90d supply, fill #1
  Filled 2023-04-04: qty 90, 90d supply, fill #2

## 2022-07-01 ENCOUNTER — Other Ambulatory Visit (HOSPITAL_COMMUNITY): Payer: Self-pay

## 2022-07-02 ENCOUNTER — Telehealth: Payer: Self-pay

## 2022-07-02 NOTE — Telephone Encounter (Signed)
Returned daughter's call regarding Nair cream burn. Daughter states that Pt is currently in [facility] and now has a burn on lower abdomen/upper groin from using Nair cream. Pt denies fever or drainage from burn. Pt asking for antibiotics to be called in to [facility]. Advised daughter that we are unable to prescribe antibiotics without seeing the Pt. Instructed daughter to keep the burn covered with a dry/sterile dressing, use cool/wet compress to relieve pain, apply Vaseline, and to avoid pool/hot tub/bath until healed. Advised daughter that Pt should seek urgent care in [facility] if burn worsens. Daughter verbalized understanding.

## 2022-07-07 ENCOUNTER — Other Ambulatory Visit (HOSPITAL_COMMUNITY): Payer: Self-pay

## 2022-07-23 LAB — SIGNATERA
SIGNATERA MTM READOUT: 0 MTM/ml
SIGNATERA TEST RESULT: NEGATIVE

## 2022-07-27 ENCOUNTER — Telehealth: Payer: Self-pay

## 2022-07-27 NOTE — Telephone Encounter (Signed)
Called pt per MD to advise [employer] testing was negative/not detected. Pt verbalized understanding of results and knows [facility] will be in touch to schedule 3 mo repeat lab.   

## 2022-08-01 ENCOUNTER — Other Ambulatory Visit: Payer: Self-pay | Admitting: Hematology and Oncology

## 2022-08-03 ENCOUNTER — Other Ambulatory Visit: Payer: Self-pay

## 2022-08-05 ENCOUNTER — Encounter: Payer: Self-pay | Admitting: Hematology and Oncology

## 2022-08-05 ENCOUNTER — Other Ambulatory Visit (HOSPITAL_COMMUNITY): Payer: Self-pay

## 2022-08-05 MED ORDER — TRAMADOL HCL 50 MG PO TABS
50.0000 mg | ORAL_TABLET | Freq: Two times a day (BID) | ORAL | 0 refills | Status: DC | PRN
  Filled 2022-08-05: qty 60, 30d supply, fill #0

## 2022-08-05 NOTE — Telephone Encounter (Signed)
PDMP review

## 2022-09-06 ENCOUNTER — Other Ambulatory Visit: Payer: Self-pay | Admitting: Hematology and Oncology

## 2022-09-06 DIAGNOSIS — E876 Hypokalemia: Secondary | ICD-10-CM

## 2022-09-07 ENCOUNTER — Other Ambulatory Visit (HOSPITAL_COMMUNITY): Payer: Self-pay

## 2022-09-08 ENCOUNTER — Other Ambulatory Visit: Payer: Self-pay

## 2022-09-11 ENCOUNTER — Other Ambulatory Visit: Payer: Self-pay | Admitting: *Deleted

## 2022-09-11 ENCOUNTER — Encounter: Payer: Self-pay | Admitting: Hematology and Oncology

## 2022-09-11 ENCOUNTER — Other Ambulatory Visit (HOSPITAL_COMMUNITY): Payer: Self-pay

## 2022-09-11 MED ORDER — GABAPENTIN 300 MG PO CAPS
900.0000 mg | ORAL_CAPSULE | Freq: Every day | ORAL | 4 refills | Status: DC
  Filled 2022-09-11 – 2023-02-03 (×3): qty 180, 60d supply, fill #0
  Filled 2023-04-04: qty 180, 60d supply, fill #1

## 2022-09-11 MED ORDER — GABAPENTIN 100 MG PO CAPS
ORAL_CAPSULE | ORAL | 3 refills | Status: DC
  Filled 2022-09-11: qty 240, 60d supply, fill #0

## 2022-09-11 MED ORDER — POTASSIUM CHLORIDE CRYS ER 20 MEQ PO TBCR
20.0000 meq | EXTENDED_RELEASE_TABLET | Freq: Every day | ORAL | 2 refills | Status: DC
Start: 2022-09-11 — End: 2022-10-12
  Filled 2022-09-11: qty 30, 30d supply, fill #0

## 2022-09-12 ENCOUNTER — Other Ambulatory Visit: Payer: Self-pay | Admitting: Hematology and Oncology

## 2022-09-12 ENCOUNTER — Other Ambulatory Visit (HOSPITAL_COMMUNITY): Payer: Self-pay

## 2022-09-14 ENCOUNTER — Other Ambulatory Visit: Payer: Self-pay

## 2022-09-15 ENCOUNTER — Other Ambulatory Visit (HOSPITAL_COMMUNITY): Payer: Self-pay

## 2022-09-15 MED ORDER — TRAMADOL HCL 50 MG PO TABS
50.0000 mg | ORAL_TABLET | Freq: Two times a day (BID) | ORAL | 0 refills | Status: DC | PRN
  Filled 2022-09-28: qty 60, 30d supply, fill #0

## 2022-09-17 ENCOUNTER — Other Ambulatory Visit (HOSPITAL_COMMUNITY): Payer: Self-pay

## 2022-09-22 ENCOUNTER — Other Ambulatory Visit (HOSPITAL_COMMUNITY): Payer: Self-pay

## 2022-09-28 ENCOUNTER — Other Ambulatory Visit: Payer: Self-pay

## 2022-09-28 ENCOUNTER — Encounter: Payer: Self-pay | Admitting: Hematology and Oncology

## 2022-09-28 ENCOUNTER — Other Ambulatory Visit (HOSPITAL_COMMUNITY): Payer: Self-pay

## 2022-09-30 ENCOUNTER — Encounter: Payer: Self-pay | Admitting: Hematology and Oncology

## 2022-09-30 ENCOUNTER — Other Ambulatory Visit (HOSPITAL_COMMUNITY): Payer: Self-pay

## 2022-09-30 MED ORDER — PROMETHAZINE HCL 12.5 MG PO TABS
12.5000 mg | ORAL_TABLET | Freq: Four times a day (QID) | ORAL | 0 refills | Status: DC | PRN
  Filled 2022-09-30: qty 10, 3d supply, fill #0

## 2022-09-30 MED ORDER — CEPHALEXIN 500 MG PO CAPS
500.0000 mg | ORAL_CAPSULE | Freq: Four times a day (QID) | ORAL | 0 refills | Status: DC
  Filled 2022-09-30: qty 20, 5d supply, fill #0

## 2022-09-30 MED ORDER — OXYCODONE HCL 5 MG PO TABS
5.0000 mg | ORAL_TABLET | Freq: Four times a day (QID) | ORAL | 0 refills | Status: DC
  Filled 2022-09-30: qty 30, 8d supply, fill #0

## 2022-10-01 ENCOUNTER — Emergency Department (HOSPITAL_COMMUNITY): Payer: Medicaid Other | Admitting: Anesthesiology

## 2022-10-01 ENCOUNTER — Inpatient Hospital Stay: Admit: 2022-10-01 | Payer: Medicaid Other | Admitting: General Surgery

## 2022-10-01 ENCOUNTER — Emergency Department (HOSPITAL_BASED_OUTPATIENT_CLINIC_OR_DEPARTMENT_OTHER): Payer: Medicaid Other | Admitting: Anesthesiology

## 2022-10-01 ENCOUNTER — Encounter (HOSPITAL_COMMUNITY)
Admission: EM | Disposition: A | Discharge status: Home / Self Care | Payer: Self-pay | Source: Home / Self Care | Attending: Emergency Medicine

## 2022-10-01 ENCOUNTER — Other Ambulatory Visit: Payer: Self-pay

## 2022-10-01 ENCOUNTER — Ambulatory Visit (HOSPITAL_COMMUNITY)
Admission: EM | Admit: 2022-10-01 | Discharge: 2022-10-01 | Disposition: A | Discharge status: Home / Self Care | Payer: Medicaid Other | Attending: Emergency Medicine | Admitting: Emergency Medicine

## 2022-10-01 ENCOUNTER — Encounter (HOSPITAL_COMMUNITY): Payer: Self-pay

## 2022-10-01 DIAGNOSIS — M96831 Postprocedural hemorrhage and hematoma of a musculoskeletal structure following other procedure: Secondary | ICD-10-CM | POA: Insufficient documentation

## 2022-10-01 DIAGNOSIS — I10 Essential (primary) hypertension: Secondary | ICD-10-CM | POA: Diagnosis not present

## 2022-10-01 DIAGNOSIS — L7631 Postprocedural hematoma of skin and subcutaneous tissue following a dermatologic procedure: Secondary | ICD-10-CM

## 2022-10-01 DIAGNOSIS — Y834 Other reconstructive surgery as the cause of abnormal reaction of the patient, or of later complication, without mention of misadventure at the time of the procedure: Secondary | ICD-10-CM | POA: Insufficient documentation

## 2022-10-01 DIAGNOSIS — M96841 Postprocedural hematoma of a musculoskeletal structure following other procedure: Secondary | ICD-10-CM | POA: Diagnosis not present

## 2022-10-01 DIAGNOSIS — Z9889 Other specified postprocedural states: Secondary | ICD-10-CM

## 2022-10-01 HISTORY — PX: WOUND EXPLORATION: SHX6188

## 2022-10-01 SURGERY — WOUND EXPLORATION
Anesthesia: General

## 2022-10-01 MED ORDER — CEFAZOLIN SODIUM-DEXTROSE 2-4 GM/100ML-% IV SOLN
INTRAVENOUS | Status: AC
  Filled 2022-10-01: qty 100

## 2022-10-01 MED ORDER — 0.9 % SODIUM CHLORIDE (POUR BTL) OPTIME
TOPICAL | Status: DC | PRN
Start: 2022-10-01 — End: 2022-10-01
  Administered 2022-10-01 (×2): 1000 mL

## 2022-10-01 MED ORDER — ONDANSETRON HCL 4 MG/2ML IJ SOLN
INTRAMUSCULAR | Status: AC
  Filled 2022-10-01: qty 2

## 2022-10-01 MED ORDER — LIDOCAINE HCL (CARDIAC) PF 100 MG/5ML IV SOSY
PREFILLED_SYRINGE | INTRAVENOUS | Status: DC | PRN
  Administered 2022-10-01: 40 mg via INTRAVENOUS

## 2022-10-01 MED ORDER — SUCCINYLCHOLINE CHLORIDE 200 MG/10ML IV SOSY
PREFILLED_SYRINGE | INTRAVENOUS | Status: DC | PRN
  Administered 2022-10-01: 120 mg via INTRAVENOUS

## 2022-10-01 MED ORDER — FENTANYL CITRATE (PF) 100 MCG/2ML IJ SOLN
INTRAMUSCULAR | Status: AC
  Filled 2022-10-01: qty 2

## 2022-10-01 MED ORDER — FENTANYL CITRATE (PF) 250 MCG/5ML IJ SOLN
INTRAMUSCULAR | Status: AC
  Filled 2022-10-01: qty 5

## 2022-10-01 MED ORDER — CEFAZOLIN SODIUM-DEXTROSE 2-3 GM-%(50ML) IV SOLR
INTRAVENOUS | Status: DC | PRN
Start: 2022-10-01 — End: 2022-10-01
  Administered 2022-10-01: 2 g via INTRAVENOUS

## 2022-10-01 MED ORDER — FENTANYL CITRATE (PF) 100 MCG/2ML IJ SOLN
25.0000 ug | INTRAMUSCULAR | Status: DC | PRN
  Administered 2022-10-01: 50 ug via INTRAVENOUS

## 2022-10-01 MED ORDER — ROCURONIUM BROMIDE 100 MG/10ML IV SOLN
INTRAVENOUS | Status: DC | PRN
  Administered 2022-10-01: 20 mg via INTRAVENOUS

## 2022-10-01 MED ORDER — LACTATED RINGERS IV SOLN
INTRAVENOUS | Status: DC | PRN
Start: 2022-10-01 — End: 2022-10-01

## 2022-10-01 MED ORDER — PROPOFOL 10 MG/ML IV BOLUS
INTRAVENOUS | Status: AC
  Filled 2022-10-01: qty 20

## 2022-10-01 MED ORDER — PHENYLEPHRINE 80 MCG/ML (10ML) SYRINGE FOR IV PUSH (FOR BLOOD PRESSURE SUPPORT)
PREFILLED_SYRINGE | INTRAVENOUS | Status: DC | PRN
Start: 2022-10-01 — End: 2022-10-01
  Administered 2022-10-01 (×3): 160 ug via INTRAVENOUS
  Administered 2022-10-01 (×2): 80 ug via INTRAVENOUS
  Administered 2022-10-01: 160 ug via INTRAVENOUS

## 2022-10-01 MED ORDER — MIDAZOLAM HCL 2 MG/2ML IJ SOLN
INTRAMUSCULAR | Status: DC | PRN
  Administered 2022-10-01: 2 mg via INTRAVENOUS

## 2022-10-01 MED ORDER — ONDANSETRON HCL 4 MG/2ML IJ SOLN
INTRAMUSCULAR | Status: DC | PRN
  Administered 2022-10-01: 4 mg via INTRAVENOUS

## 2022-10-01 MED ORDER — OXYCODONE HCL 5 MG PO TABS
ORAL_TABLET | ORAL | Status: AC
  Filled 2022-10-01: qty 1

## 2022-10-01 MED ORDER — SUGAMMADEX SODIUM 200 MG/2ML IV SOLN
INTRAVENOUS | Status: DC | PRN
Start: 2022-10-01 — End: 2022-10-01
  Administered 2022-10-01: 200 mg via INTRAVENOUS

## 2022-10-01 MED ORDER — PROPOFOL 10 MG/ML IV BOLUS
INTRAVENOUS | Status: DC | PRN
  Administered 2022-10-01: 160 mg via INTRAVENOUS

## 2022-10-01 MED ORDER — FENTANYL CITRATE (PF) 250 MCG/5ML IJ SOLN
INTRAMUSCULAR | Status: DC | PRN
Start: 2022-10-01 — End: 2022-10-01
  Administered 2022-10-01 (×2): 50 ug via INTRAVENOUS

## 2022-10-01 MED ORDER — ALBUMIN HUMAN 5 % IV SOLN: INTRAVENOUS | Status: DC | PRN

## 2022-10-01 MED ORDER — ONDANSETRON HCL 4 MG/2ML IJ SOLN: 4.0000 mg | Freq: Four times a day (QID) | INTRAMUSCULAR | Status: DC | PRN

## 2022-10-01 MED ORDER — MIDAZOLAM HCL 2 MG/2ML IJ SOLN
INTRAMUSCULAR | Status: AC
  Filled 2022-10-01: qty 2

## 2022-10-01 MED ORDER — OXYCODONE HCL 5 MG PO TABS
5.0000 mg | ORAL_TABLET | Freq: Once | ORAL | Status: AC | PRN
  Administered 2022-10-01: 5 mg via ORAL

## 2022-10-01 MED ORDER — DEXAMETHASONE SODIUM PHOSPHATE 10 MG/ML IJ SOLN
INTRAMUSCULAR | Status: AC
  Filled 2022-10-01: qty 1

## 2022-10-01 MED ORDER — PHENYLEPHRINE HCL-NACL 20-0.9 MG/250ML-% IV SOLN
INTRAVENOUS | Status: DC | PRN
Start: 2022-10-01 — End: 2022-10-01
  Administered 2022-10-01: 30 ug/min via INTRAVENOUS

## 2022-10-01 MED ORDER — PHENYLEPHRINE 80 MCG/ML (10ML) SYRINGE FOR IV PUSH (FOR BLOOD PRESSURE SUPPORT)
PREFILLED_SYRINGE | INTRAVENOUS | Status: AC
  Filled 2022-10-01: qty 10

## 2022-10-01 MED ORDER — OXYCODONE HCL 5 MG/5ML PO SOLN: 5.0000 mg | Freq: Once | ORAL | Status: AC | PRN

## 2022-10-01 SURGICAL SUPPLY — 55 items
APL SKNCLS STERI-STRIP NONHPOA (GAUZE/BANDAGES/DRESSINGS) ×2
ATCH SMKEVC FLXB CAUT HNDSWH (FILTER) ×2 IMPLANT
BAG COUNTER SPONGE SURGICOUNT (BAG) ×2 IMPLANT
BAG DECANTER FOR FLEXI CONT (MISCELLANEOUS) ×2 IMPLANT
BAG SPNG CNTER NS LX DISP (BAG) ×1
BENZOIN TINCTURE PRP APPL 2/3 (GAUZE/BANDAGES/DRESSINGS) ×4 IMPLANT
BLADE KNIFE 20 PERSONNA (BLADE) ×8 IMPLANT
BLADE KNIFE PERSONA 15 (BLADE) ×4 IMPLANT
CLSR STERI-STRIP ANTIMIC 1/2X4 (GAUZE/BANDAGES/DRESSINGS) IMPLANT
CONNECTOR 5 IN 1 STRAIGHT STRL (MISCELLANEOUS) ×2 IMPLANT
COVER SURGICAL LIGHT HANDLE (MISCELLANEOUS) ×2 IMPLANT
DRAIN CHANNEL 10F 3/8 F FF (DRAIN) ×4 IMPLANT
DRAPE LAPAROSCOPIC ABDOMINAL (DRAPES) ×2 IMPLANT
DRAPE LAPAROTOMY TRNSV 102X78 (DRAPES) IMPLANT
DRAPE WARM FLUID 44X44 (DRAPES) IMPLANT
ELECT CAUTERY BLADE 6.4 (BLADE) ×2 IMPLANT
ELECT REM PT RETURN 9FT ADLT (ELECTROSURGICAL) ×1
ELECTRODE REM PT RTRN 9FT ADLT (ELECTROSURGICAL) ×2 IMPLANT
EVACUATOR PREFILTER SMOKE (MISCELLANEOUS) ×2 IMPLANT
EVACUATOR SILICONE 100CC (DRAIN) ×4 IMPLANT
GAUZE PAD ABD 8X10 STRL (GAUZE/BANDAGES/DRESSINGS) ×8 IMPLANT
GAUZE SPONGE 4X4 12PLY STRL (GAUZE/BANDAGES/DRESSINGS) ×4 IMPLANT
GAUZE SPONGE 4X4 12PLY STRL LF (GAUZE/BANDAGES/DRESSINGS) IMPLANT
GAUZE XEROFORM 5X9 LF (GAUZE/BANDAGES/DRESSINGS) ×4 IMPLANT
GLOVE ECLIPSE 7.0 STRL STRAW (GLOVE) ×4 IMPLANT
GOWN STRL REUS W/ TWL LRG LVL3 (GOWN DISPOSABLE) ×6 IMPLANT
GOWN STRL REUS W/TWL LRG LVL3 (GOWN DISPOSABLE) ×3
KIT BASIN OR (CUSTOM PROCEDURE TRAY) ×2 IMPLANT
KIT TURNOVER KIT B (KITS) ×2 IMPLANT
MARKER SKIN DUAL TIP RULER LAB (MISCELLANEOUS) ×2 IMPLANT
NDL SPNL 18GX3.5 QUINCKE PK (NEEDLE) ×4 IMPLANT
NEEDLE SPNL 18GX3.5 QUINCKE PK (NEEDLE) ×2 IMPLANT
NS IRRIG 1000ML POUR BTL (IV SOLUTION) ×4 IMPLANT
PACK GENERAL/GYN (CUSTOM PROCEDURE TRAY) ×2 IMPLANT
PAD ARMBOARD 7.5X6 YLW CONV (MISCELLANEOUS) ×4 IMPLANT
PIN SAFETY STERILE (MISCELLANEOUS) ×2 IMPLANT
PREFILTER EVAC NS 1 1/3-3/8IN (MISCELLANEOUS) ×2 IMPLANT
PREFILTER SMOKE EVAC (FILTER) ×2 IMPLANT
SPECIMEN JAR LARGE (MISCELLANEOUS) ×4 IMPLANT
SPONGE T-LAP 18X18 ~~LOC~~+RFID (SPONGE) ×8 IMPLANT
STAPLER VISISTAT 35W (STAPLE) IMPLANT
STRIP CLOSURE SKIN 1/2X4 (GAUZE/BANDAGES/DRESSINGS) ×8 IMPLANT
SUT MNCRL AB 3-0 PS2 18 (SUTURE) ×8 IMPLANT
SUT MON AB 5-0 PS2 18 (SUTURE) ×4 IMPLANT
SUT PROLENE 3 0 PS 1 (SUTURE) ×8 IMPLANT
SUT VIC AB 2-0 CT1 27 (SUTURE) ×1
SUT VIC AB 2-0 CT1 TAPERPNT 27 (SUTURE) IMPLANT
SUT VIC AB 3-0 SH 27 (SUTURE) ×1
SUT VIC AB 3-0 SH 27X BRD (SUTURE) IMPLANT
SUT VICRYL RAPIDE 4/0 PS 2 (SUTURE) IMPLANT
SYR 50ML SLIP (SYRINGE) ×4 IMPLANT
TOWEL GREEN STERILE (TOWEL DISPOSABLE) ×4 IMPLANT
TOWEL GREEN STERILE FF (TOWEL DISPOSABLE) ×2 IMPLANT
TUBING 1/4  WITH 7/8  ADAPTER (MISCELLANEOUS) ×2 IMPLANT
WATER STERILE IRR 1000ML POUR (IV SOLUTION) ×4 IMPLANT

## 2022-10-01 NOTE — Anesthesia Procedure Notes (Signed)
Procedure Name: Intubation Date/Time: 10/01/2022 9:18 PM  Performed by: Renford Dills, CRNAPre-anesthesia Checklist: Patient identified, Patient being monitored, Timeout performed, Emergency Drugs available and Suction available Patient Re-evaluated:Patient Re-evaluated prior to induction Oxygen Delivery Method: Circle System Utilized Preoxygenation: Pre-oxygenation with 100% oxygen Induction Type: IV induction, Rapid sequence and Cricoid Pressure applied Laryngoscope Size: Miller and 2 Grade View: Grade I Tube type: Oral Tube size: 7.0 mm Number of attempts: 1 Airway Equipment and Method: stylet Placement Confirmation: ETT inserted through vocal cords under direct vision, positive ETCO2 and breath sounds checked- equal and bilateral Secured at: 20 cm Tube secured with: Tape Dental Injury: Teeth and Oropharynx as per pre-operative assessment

## 2022-10-01 NOTE — Anesthesia Preprocedure Evaluation (Signed)
Anesthesia Evaluation  Patient identified by MRN, date of birth, ID band Patient awake    Reviewed: Allergy & Precautions, H&P , NPO status , Patient's Chart, lab work & pertinent test results  Airway Mallampati: II   Neck ROM: full    Dental   Pulmonary neg pulmonary ROS   breath sounds clear to auscultation       Cardiovascular hypertension,  Rhythm:regular Rate:Normal     Neuro/Psych    GI/Hepatic   Endo/Other    Renal/GU      Musculoskeletal  (+) Arthritis ,    Abdominal   Peds  Hematology   Anesthesia Other Findings   Reproductive/Obstetrics H/o breast CA                             Anesthesia Physical Anesthesia Plan  ASA: 2 and emergent  Anesthesia Plan: General   Post-op Pain Management:    Induction: Intravenous, Rapid sequence and Cricoid pressure planned  PONV Risk Score and Plan: 3 and Ondansetron, Dexamethasone, Midazolam and Treatment may vary due to age or medical condition  Airway Management Planned: Oral ETT  Additional Equipment:   Intra-op Plan:   Post-operative Plan: Extubation in OR  Informed Consent: I have reviewed the patients History and Physical, chart, labs and discussed the procedure including the risks, benefits and alternatives for the proposed anesthesia with the patient or authorized representative who has indicated his/her understanding and acceptance.     Dental advisory given  Plan Discussed with: CRNA, Anesthesiologist and Surgeon  Anesthesia Plan Comments:        Anesthesia Quick Evaluation

## 2022-10-01 NOTE — ED Triage Notes (Signed)
Patient arrives POV from home for bleeding from surgical site since she woke up from a nap around 5pm. Dr [name] office advise that she return to the ED and plans to meet for consult. HR 115, BP 109/89 Patient c/o abdominal pressure at 3/10. A&Ox4, ambulatory.

## 2022-10-01 NOTE — Discharge Instructions (Signed)
Please follow discharge instructions from Dr. [name] office

## 2022-10-01 NOTE — H&P (Signed)
Reason for Visit: bleeding  CC:I have noticed a significant amount of drainiage from around my drain site  HPI:  [name] is an 52 y.o.  female who presents with  bleeding from around her drain.  Pt had an extended abdominoplasty this morning; was fine unti lshe got up out of bed to use the restroom.  She stated that when had a significant amount of drainage from around her drain.   She has also noticed since then that she has continued to oooze and her binder has become soiled.    Previous treatment:   Extended abdominoplasty this am, minimal bleeding / drain output during procedure of in recovery room afterwards   Past Medical History:  Diagnosis Date   Arthritis    Breast cancer (HCC) 11/24/2019   left    Complication of anesthesia    DAY SURGERY 2010 OR 2011 ASPIRATED AND STAYED OVERNIGHT   History of radiation therapy 03/11/17- 04/21/17   Left breast, 2 Gy in 25 fractions for a total dose of 50 Gy, Boost, 2 Gy in 5 fractions for a total dose of 10 Gy   Hypertension    OFF MEDS  SINCE CHEMO X 5 MONTHS   Malignant neoplasm of upper-outer quadrant of left breast in female, estrogen receptor negative (HCC) 06/23/2016    Past Surgical History:  Procedure Laterality Date   ANKLE SURGERY Right    BREAST CYST EXCISION Right 07/15/2020   Procedure: Excision excess right axillary tissue;  Surgeon: Allena Napoleon, MD;  Location: [facility];  Service: Plastics;  Laterality: Right;   BREAST LUMPECTOMY Left 01/27/2017   BREAST LUMPECTOMY WITH RADIOACTIVE SEED AND SENTINEL LYMPH NODE BIOPSY Left 01/27/2017   Procedure: LEFT BREAST LUMPECTOMY WITH RADIOACTIVE SEED LOCALIZATION, LEFT AXILLARY DEEP LYMPH NODE BIOPSY WITH RADIOACTIVE SEED LOCALIZATION, LEFT AXILLARY SENTINEL LYMPH NODE MAPPING AND BIOPSY WITH BLUE DYE INJECTION;  Surgeon: Claud Kelp, MD;  Location: [facility];  Service: General;  Laterality: Left;   COLONOSCOPY WITH PROPOFOL N/A 02/01/2020   Procedure: COLONOSCOPY WITH PROPOFOL;   Surgeon: Rachael Fee, MD;  Location: [facility];  Service: Endoscopy;  Laterality: N/A;   DEBRIDEMENT AND CLOSURE WOUND Left 03/28/2020   Procedure: DEBRIDEMENT AND CLOSURE OF MASTECTOMY WOUND;  Surgeon: Emelia Loron, MD;  Location: [facility];  Service: General;  Laterality: Left;   DEBRIDEMENT AND CLOSURE WOUND Left 07/15/2020   Procedure: Debridement of left chest wound and rotational flap closure;  Surgeon: Allena Napoleon, MD;  Location: [facility] OR;  Service: Plastics;  Laterality: Left;  1.5 hour   ENDOSCOPIC MUCOSAL RESECTION  02/01/2020   Procedure: ENDOSCOPIC MUCOSAL RESECTION;  Surgeon: Rachael Fee, MD;  Location: [facility];  Service: Endoscopy;;   HEMOSTASIS CLIP PLACEMENT  02/01/2020   Procedure: HEMOSTASIS CLIP PLACEMENT;  Surgeon: Rachael Fee, MD;  Location: [facility];  Service: Endoscopy;;   INCISIONAL HERNIA REPAIR N/A 02/26/2021   Procedure: LAPAROSCOPIC INCISIONAL HERNIA WITH MESH;  Surgeon: Emelia Loron, MD;  Location: [facility];  Service: General;  Laterality: N/A;   IR FLUORO GUIDE PORT INSERTION RIGHT  07/14/2016   IR US GUIDE VASC ACCESS RIGHT  07/14/2016   MASTECTOMY W/ SENTINEL NODE BIOPSY Bilateral 01/09/2020   Procedure: BILATERAL MASTECTOMY WITH LEFT AXILLARY SENTINEL LYMPH NODE BIOPSY;  Surgeon: Emelia Loron, MD;  Location: [facility];  Service: General;  Laterality: Bilateral;   PORT-A-CATH REMOVAL Right 01/27/2017   Procedure: REMOVAL PORT-A-CATH;  Surgeon: Claud Kelp, MD;  Location: [facility];  Service: General;  Laterality: Right;   PORT-A-CATH REMOVAL Right 02/26/2021   Procedure: REMOVAL PORT-A-CATH;  Surgeon: Emelia Loron, MD;  Location: [facility];  Service: General;  Laterality: Right;   PORTACATH PLACEMENT Right 01/09/2020   Procedure: INSERTION PORT-A-CATH;  Surgeon: Emelia Loron, MD;  Location: [facility];  Service: General;  Laterality: Right;   ROBOTIC ASSISTED TOTAL HYSTERECTOMY WITH  BILATERAL SALPINGO OOPHERECTOMY N/A 02/26/2021   Procedure: XI ROBOTIC ASSISTED BILATERAL SALPINGO OOPHORECTOMY;  Surgeon: Carver Fila, MD;  Location: [facility];  Service: Gynecology;  Laterality: N/A;   SUBMUCOSAL LIFTING INJECTION  02/01/2020   Procedure: SUBMUCOSAL LIFTING INJECTION;  Surgeon: Rachael Fee, MD;  Location: [facility];  Service: Endoscopy;;   TUBAL LIGATION     WISDOM TOOTH EXTRACTION      Family History  Problem Relation Age of Onset   Diabetes Mother    Hypertension Mother    Arthritis Mother    Colon cancer Father 14       died .76 metastatic at time of diagnosis   Diabetes Father    Heart disease Father    Hypertension Father    Breast cancer Maternal Grandmother 56       d.60s   Breast cancer Maternal Aunt 52   Cervical cancer Maternal Aunt 55   Breast cancer Maternal Aunt 46       d.50s   Cancer Maternal Uncle        d.62s unspecified type of cancer   Breast cancer Cousin 65       paternal first-cousin (daughter of unaffected aunt)   Cancer Maternal Aunt 27       "Female Cancer"   Cancer Maternal Aunt        unknown cancer   Brain cancer Daughter 38   Esophageal cancer Other    Colon polyps Neg Hx    Rectal cancer Neg Hx    Stomach cancer Neg Hx     Social History:  reports that she has never smoked. She has never used smokeless tobacco. She reports current alcohol use. She reports that she does not use drugs.  Allergies:  Allergies  Allergen Reactions   Carboplatin Other (See Comments)    Red, blotchy, hot    Medications: I have reviewed the patient's current medications.  No results found. However, due to the size of the patient record, not all encounters were searched. Please check Results Review for a complete set of results.  No results found.  Pertinent items are noted in HPI. Temp:  [98.1 F (36.7 C)] 98.1 F (36.7 C) (09/26 2009) Pulse Rate:  [107-121] 107 (09/26 2030) Resp:  [15-20] 15 (09/26 2030) BP: (109)/(89)  109/89 (09/26 2009) SpO2:  [100 %] 100 % (09/26 2030) Weight:  [76.2 kg] 76.2 kg (09/26 2011) General appearance: alert and cooperative Resp: clear to auscultation bilaterally GI: abd with abd binder in place; evidence of bleeding from around JP drain   Assessment: S/p Abdominoplasty with post operative bleeding Plan: To OR for exploration I have discussed this treatment plan in detail with patient and/or family, including the risks of the recommended treatment and surgery, the benefits and the alternatives.  The patient and/or caregiver understands that additional treatment may be necessary.  [name] 10/01/2022, 8:49 PM

## 2022-10-01 NOTE — ED Notes (Signed)
Report given to PACU anesthesia

## 2022-10-01 NOTE — Transfer of Care (Signed)
Immediate Anesthesia Transfer of Care Note  Patient: [name]  Procedure(s) Performed: ABDOMINAL EXPLORATION  Patient Location: PACU  Anesthesia Type:General  Level of Consciousness: awake  Airway & Oxygen Therapy: Patient Spontanous Breathing and Patient connected to nasal cannula oxygen  Post-op Assessment: Report given to RN and Post -op Vital signs reviewed and stable  Post vital signs: Reviewed and stable  Last Vitals:  Vitals Value Taken Time  BP    Temp    Pulse 98 10/01/22 2233  Resp 16 10/01/22 2233  SpO2 100 % 10/01/22 2233  Vitals shown include unfiled device data.  Last Pain:  Vitals:   10/01/22 2009  TempSrc: Oral  PainSc:          Complications: No notable events documented.

## 2022-10-01 NOTE — ED Provider Notes (Signed)
[place] EMERGENCY DEPARTMENT AT [facility] Provider Note   CSN: [id number] Arrival date & time: 10/01/22  2003     History  Chief Complaint  Patient presents with   Post-op Problem    [name] is a 52 y.o. female.  HPI 52 year old female with past medical history of breast cancer, recent abdominoplasty this morning presenting for evaluation of postop problem.  Patient states that she returned home after today's surgery and was doing well.  She woke up from a nap and when she stood up she noticed that there was some bleeding around her abdomen.  This was at approximately 2 PM.  She has since soaked through 5 ABD pads.  On my evaluation in the emergency department, patient had soaked into a towel as well, which she states had been placed only within the last 30 minutes.  Abdominal binder is still in place and patient has not looked under it.  Patient denies any lightheadedness, dizziness, chest pain, palpitations, shortness of breath.  No numbness, tingling, weakness.    Home Medications Prior to Admission medications   Medication Sig Start Date End Date Taking? Authorizing Provider  anastrozole (ARIMIDEX) 1 MG tablet Take 1 tablet (1 mg total) by mouth daily. 06/29/22   Serena Croissant, MD  benzonatate (TESSALON) 100 MG capsule Take 1 capsule (100 mg total) by mouth 3 (three) times daily as needed. 01/01/22   Margaretann Loveless, PA-C  cephALEXin (KEFLEX) 500 MG capsule Take 1 capsule (500 mg total) by mouth 4 (four) times daily. 09/28/22     docusate sodium (COLACE) 100 MG capsule Take 100 mg by mouth daily.    [provider]  doxycycline (VIBRA-TABS) 100 MG tablet Take 1 tablet (100 mg total) by mouth 2 (two) times daily. 01/01/22   Margaretann Loveless, PA-C  gabapentin (NEURONTIN) 100 MG capsule Take 2 capsules (200 mg total) by mouth in the morning AND 2 capsules (200 mg total) daily in the afternoon. 09/11/22   Serena Croissant, MD  gabapentin (NEURONTIN)  300 MG capsule Take 3 capsules (900 mg total) by mouth at bedtime. 09/11/22   Serena Croissant, MD  olaparib (LYNPARZA) 150 MG tablet Take 2 tablets (300 mg total) by mouth 2 (two) times daily. Swallow whole. May take with food to decrease nausea and vomiting.Start May 23, 2020 10/30/20   Magrinat, Valentino Hue, MD  oxyCODONE (OXY IR/ROXICODONE) 5 MG immediate release tablet Take 1 tablet (5 mg total) by mouth every 6 (six) hours. 09/28/22     potassium chloride SA (KLOR-CON M) 20 MEQ tablet Take 1 tablet (20 mEq total) by mouth daily. 09/11/22   Serena Croissant, MD  predniSONE (DELTASONE) 20 MG tablet Take 2 tablets (40 mg total) by mouth daily with breakfast. 01/01/22   Margaretann Loveless, PA-C  promethazine (PHENERGAN) 12.5 MG tablet Take 1 tablet (12.5 mg total) by mouth every 6 (six) hours as needed for nausea 09/28/22     promethazine-dextromethorphan (PROMETHAZINE-DM) 6.25-15 MG/5ML syrup Take 5 mLs by mouth 4 (four) times daily as needed. 01/01/22   Margaretann Loveless, PA-C  traMADol (ULTRAM) 50 MG tablet Take 1 tablet (50 mg total) by mouth every 12 (twelve) hours as needed. 09/15/22 03/14/23  Serena Croissant, MD  venlafaxine XR (EFFEXOR-XR) 75 MG 24 hr capsule Take 1 capsule (75 mg total) by mouth daily with breakfast. Patient not taking: Reported on 03/25/2020 02/12/20 03/28/20  Magrinat, Valentino Hue, MD      Allergies  Carboplatin    Review of Systems   Review of Systems  Physical Exam Updated Vital Signs BP 109/89 (BP Location: Right Arm)   Pulse (!) 121   Temp 98.1 F (36.7 C) (Oral)   Resp 20   Ht 5' 7.5" (1.715 m)   Wt 76.2 kg   LMP 06/25/2016   SpO2 100%   BMI 25.92 kg/m  Physical Exam Constitutional:      General: She is not in acute distress. HENT:     Head: Normocephalic and atraumatic.     Right Ear: External ear normal.     Left Ear: External ear normal.     Mouth/Throat:     Mouth: Mucous membranes are moist.  Eyes:     Conjunctiva/sclera: Conjunctivae normal.   Cardiovascular:     Rate and Rhythm: Regular rhythm. Tachycardia present.     Pulses: Normal pulses.  Pulmonary:     Effort: Pulmonary effort is normal. No respiratory distress.     Breath sounds: No wheezing.  Abdominal:     General: Abdomen is flat.     Comments: Abdominal binder in place.  JP drain with sanguinous fluid.  She also has bleeding into a towel that was placed over the wound.  Mild abdominal tenderness to palpation  Musculoskeletal:        General: Normal range of motion.     Right lower leg: No edema.     Left lower leg: No edema.  Skin:    General: Skin is warm and dry.     Findings: No rash.  Neurological:     General: No focal deficit present.     Mental Status: She is alert.     Sensory: No sensory deficit.     Motor: No weakness.     ED Results / Procedures / Treatments   Labs (all labs ordered are listed, but only abnormal results are displayed) Labs Reviewed - No data to display  EKG None  Radiology No results found.  Procedures Procedures    Medications Ordered in ED Medications - No data to display  ED Course/ Medical Decision Making/ A&P                                 Medical Decision Making Risk Decision regarding hospitalization.   52 year old female with past medical history and HPI as above.  Patient presenting with abdominal bleeding following abdominoplasty completed earlier in the day.  She has abdominal bleeding that is set through several ABD pads to help.  However, remains hemodynamically stable with the exception of mild tachycardia.  Mentation is normal.  Patient's plastic surgery team is aware the patient is return to the ED and are planning to take her to the OR for revision.  Patient reports that she last ate 3 Pringles and a small amount of water approximately 3 hours ago.  Patient required no additional acute emergency intervention prior to transitioning to the operating room.     Final Clinical Impression(s) / ED  Diagnoses Final diagnoses:  History of abdominoplasty    Rx / DC Orders ED Discharge Orders     None         Lyman Speller, MD 10/01/22 1610    Alvira Monday, MD 10/03/22 1420

## 2022-10-01 NOTE — Op Note (Signed)
[name], WHYNOT MEDICAL RECORD NO: [id number] ACCOUNT NO: [id number] DATE OF BIRTH: 1970/03/21 FACILITY: [facility] LOCATION: [facility] PHYSICIAN: Cordera Stineman C. Izora Ribas, MD  Operative Report   DATE OF PROCEDURE: 10/01/2022  PREOPERATIVE DIAGNOSIS:  Status post abdominoplasty with postoperative bleeding.  POSTOPERATIVE DIAGNOSIS:  Status post abdominoplasty with postoperative bleeding.  PROCEDURE: 1.  Exploration of abdominal wound. 2.  Evacuation of hematoma. 3. Abdominal closure.  INDICATIONS:  The patient is a 53 year old female who underwent an extended abdominoplasty this morning.  There were no intraoperative complications. In recovery room after this procedure, there was minimal output in her Jackson-Pratt drain.  I received  a call this evening that when she awakened from a nap and got up to go to the restroom, there was a significant amount of drainage from around her JP site.  She monitored this for the next couple of hours and I received a return call that she was still  oozing and her abdominal binder was soiled.  She was instructed to go to [facility] Emergency Room for potential reexploration.  On evaluation, she had evidence of bleeding and therefore, she was consented for abdominal exploration.  DESCRIPTION OF PROCEDURE:  The patient was taken to the operating room and placed supine on the operating room table.  The abdomen was prepped and draped in normal sterile fashion.  The lower abdominal incision was opened.  Immediately upon opening the  incision, there was moderate amount of clotted blood.  This was evacuated and the subcutaneous and fascial tissues were thoroughly inspected.  There was a small blood vessel on the anterior abdominal wall that was actively bleeding.  This was cauterized  with good effect.  The bleeding seemed to be coming from the patient's midline and to her right side.  The incision was opened up to its entire length to the right and again after  irrigation and thorough inspection, there were couple of small oozing  areas, however, nothing significant.  The entire abdominal wall was irrigated with saline solution, patted dry and observed for ongoing bleeding, of which there was none.  Therefore, abdominal flap closure was then performed.  The deep fascia was closed  with 2-0 Vicryl followed by subdermal 3-0 Vicryl, followed by subcuticular 4-0 Vicryl stitch.  Staples were placed in the skin for skin flap reinforcement.  Sterile dressings and a new abdominal binder were placed.  Estimated blood loss approximately 250  mL of clot was removed.  The patient tolerated the procedure well and was taken to recovery room in stable condition.   VAI D: 10/01/2022 10:37:16 pm T: 10/01/2022 10:48:00 pm  JOB: [id number]/ [id number]

## 2022-10-02 ENCOUNTER — Encounter (HOSPITAL_COMMUNITY): Payer: Self-pay | Admitting: General Surgery

## 2022-10-02 NOTE — Anesthesia Postprocedure Evaluation (Signed)
Anesthesia Post Note  Patient: [name]  Procedure(s) Performed: ABDOMINAL EXPLORATION     Patient location during evaluation: PACU Anesthesia Type: General Level of consciousness: awake and alert Pain management: pain level controlled Vital Signs Assessment: post-procedure vital signs reviewed and stable Respiratory status: spontaneous breathing, nonlabored ventilation, respiratory function stable and patient connected to nasal cannula oxygen Cardiovascular status: blood pressure returned to baseline and stable Postop Assessment: no apparent nausea or vomiting Anesthetic complications: no   No notable events documented.  Last Vitals:  Vitals:   10/01/22 2300 10/01/22 2315  BP: 92/76   Pulse: 95 93  Resp: 16   Temp:  36.7 C  SpO2: 98% 95%    Last Pain:  Vitals:   10/01/22 2300  TempSrc:   PainSc: 3                  [name] S

## 2022-10-12 ENCOUNTER — Inpatient Hospital Stay: Payer: Medicaid Other | Attending: Hematology and Oncology | Admitting: Hematology and Oncology

## 2022-10-12 VITALS — BP 107/70 | HR 96 | Temp 97.9°F | Resp 17 | Wt 170.7 lb

## 2022-10-12 DIAGNOSIS — Z171 Estrogen receptor negative status [ER-]: Secondary | ICD-10-CM

## 2022-10-12 DIAGNOSIS — Z170 Estrogen receptor positive status [ER+]: Secondary | ICD-10-CM | POA: Diagnosis not present

## 2022-10-12 DIAGNOSIS — Z9013 Acquired absence of bilateral breasts and nipples: Secondary | ICD-10-CM | POA: Diagnosis not present

## 2022-10-12 DIAGNOSIS — Z79811 Long term (current) use of aromatase inhibitors: Secondary | ICD-10-CM | POA: Insufficient documentation

## 2022-10-12 DIAGNOSIS — C50412 Malignant neoplasm of upper-outer quadrant of left female breast: Secondary | ICD-10-CM | POA: Insufficient documentation

## 2022-10-12 NOTE — Progress Notes (Signed)
Patient Care Team: Marcine Matar, MD as PCP - General (Internal Medicine) Magrinat, Valentino Hue, MD (Inactive) as Consulting Physician (Oncology) Lonie Peak, MD as Attending Physician (Radiation Oncology) Axel Filler, Larna Daughters, NP as Nurse Practitioner (Hematology and Oncology) Emelia Loron, MD as Consulting Physician (General Surgery) Pace, Wendy Poet, MD as Consulting Physician (Plastic Surgery) Pershing Proud, RN as Oncology Nurse Navigator Donnelly Angelica, RN as Oncology Nurse Navigator Rachael Fee, MD as Attending Physician (Gastroenterology) Maxwell Caul, MD as Consulting Physician (Internal Medicine)  DIAGNOSIS:  Encounter Diagnosis  Name Primary?   Malignant neoplasm of upper-outer quadrant of left breast in female, estrogen receptor negative (HCC) Yes    SUMMARY OF ONCOLOGIC HISTORY: Oncology History  Malignant neoplasm of upper-outer quadrant of left breast in female, estrogen receptor negative (HCC)  06/19/2016 Initial Diagnosis   status post left breast upper outer quadrant and left axillary lymph node biopsy, both positive for a clinical T2-T3 N1, stage IIIB-C invasive ductal carcinoma, grade 3, triple negative, with an MIB-1 of 90%   07/20/2016 - 12/21/2016 Neo-Adjuvant Chemotherapy   neoadjuvant chemotherapy to consist of doxorubicin and cyclophosphamide in dose dense fashion 4 completed 09/10/2016 to be followed by paclitaxel weekly 12 given with carboplatin              (a) cycle 4 of cyclophosphamide and doxorubicin was delayed 10 days and dose decreased 10% because of febrile neutropenia after cycle 3             (b) cycle 6 of Paclitaxel and Carboplatin delayed due to neutropenia, therefore Granix added to Wednesday, Thursday, Friday following chemotherapy days   08/17/2016 Genetic Testing   BRCA2 c.8169T>A and MSH2 c.2050G>T VUS found on the common hereditary cancer panel.  The Hereditary Gene Panel offered by [facility] includes sequencing  and/or deletion duplication testing of the following 46 genes: APC, ATM, AXIN2, BARD1, BMPR1A, BRCA1, BRCA2, BRIP1, CDH1, CDKN2A (p14ARF), CDKN2A (p16INK4a), CHEK2, CTNNA1, DICER1, EPCAM (Deletion/duplication testing only), GREM1 (promoter region deletion/duplication testing only), KIT, MEN1, MLH1, MSH2, MSH3, MSH6, MUTYH, NBN, NF1, NHTL1, PALB2, PDGFRA, PMS2, POLD1, POLE, PTEN, RAD50, RAD51C, RAD51D, SDHB, SDHC, SDHD, SMAD4, SMARCA4. STK11, TP53, TSC1, TSC2, and VHL.  The following genes were evaluated for sequence changes only: SDHA and HOXB13 c.251G>A variant only.  The report date is August 17, 2016.    01/27/2017 Surgery   status post left lumpectomy and left axillary sentinel lymph node sampling with pathology showing a complete pathologic response (ypT0 ypN0); 5 left axillary lymph nodes removed      03/11/2017 - 04/21/2017 Radiation Therapy   adjuvant radiation 03/11/2017-04/21/2017  Site/dose:   1. Left breast, 2 Gy in 25 fractions for a total dose of 50 Gy                      2. Boost, 2 Gy in 5 fractions for a total dose of 10 Gy     02/19/2020 - 02/19/2020 Chemotherapy   Patient is on Treatment Plan : BREAST Adjuvant CMF IV q21d     Recurrent breast cancer, left (HCC)  12/16/2019 Initial Diagnosis   Recurrent breast cancer, left (HCC)   02/19/2020 - 02/19/2020 Chemotherapy   Patient is on Treatment Plan : BREAST Adjuvant CMF IV q21d     02/19/2020 Cancer Staging   Staging form: Breast, AJCC 8th Edition - Clinical: Stage Unknown (cT2, cNX, cM0, G2, ER+, PR-, HER2: Equivocal) - Signed by Lowella Dell, MD on 02/19/2020  CHIEF COMPLIANT: Follow-up on anastrozole therapy  Discussed the use of AI scribe software for clinical note transcription with the patient, who gave verbal consent to proceed.  History of Present Illness   The patient, with a history of cancer, presents for a routine follow-up appointment. She reports significant lifestyle changes over the past year,  including losing over eighty pounds through increased physical activity and dietary changes. The patient has progressed from walking a quarter of a mile to walking three to five miles daily. As a result of this significant weight loss, the patient underwent abdominoplasty to remove excess skin. The procedure was performed last week and the patient reports a minor complication of a small bleeding blood vessel that required additional medical attention.  The patient continues to manage her cancer with anastrozole and reports taking gabapentin and tramadol as needed. She has reduced her gabapentin intake from nine hundred to three hundred and hopes to continue weaning off of it. The patient also reports taking potassium, but questions the necessity of this supplement given her improved diet.  The patient reports some lumpiness in her chest, which she attributes to muscle growth and fat redistribution from her weight loss and increased physical activity. She expresses no concerns about her cancer and reports negative results from ongoing Neferi tests.         ALLERGIES:  is allergic to carboplatin.  MEDICATIONS:  Current Outpatient Medications  Medication Sig Dispense Refill   anastrozole (ARIMIDEX) 1 MG tablet Take 1 tablet (1 mg total) by mouth daily. 90 tablet 4   docusate sodium (COLACE) 100 MG capsule Take 100 mg by mouth daily.     gabapentin (NEURONTIN) 300 MG capsule Take 3 capsules (900 mg total) by mouth at bedtime. (Patient taking differently: Take 100 mg by mouth at bedtime.) 180 capsule 4   No current facility-administered medications for this visit.    PHYSICAL EXAMINATION: ECOG PERFORMANCE STATUS: 1 - Symptomatic but completely ambulatory  Vitals:   10/12/22 1504  BP: 107/70  Pulse: 96  Resp: 17  Temp: 97.9 F (36.6 C)  SpO2: 100%   Filed Weights   10/12/22 1504  Weight: 170 lb 11.2 oz (77.4 kg)    Physical Exam   MEASUREMENTS: WT- 170 pounds       LABORATORY  DATA:  I have reviewed the data as listed    Latest Ref Rng & Units 11/29/2021    4:21 PM 10/09/2021    2:33 PM 05/27/2021   11:09 AM  CMP  Glucose 70 - 99 mg/dL 409  811  914   BUN 6 - 20 mg/dL 6  7  7    Creatinine 0.44 - 1.00 mg/dL 7.82  9.56  2.13   Sodium 135 - 145 mmol/L 135  139  139   Potassium 3.5 - 5.1 mmol/L 3.7  3.4  3.6   Chloride 98 - 111 mmol/L 101  102  103   CO2 22 - 32 mmol/L 26  31  31    Calcium 8.9 - 10.3 mg/dL 8.6  9.4  9.4   Total Protein 6.5 - 8.1 g/dL  7.6  7.3   Total Bilirubin 0.3 - 1.2 mg/dL  0.8  0.9   Alkaline Phos 38 - 126 U/L  90  74   AST 15 - 41 U/L  17  17   ALT 0 - 44 U/L  16  12     Lab Results  Component Value Date   WBC 3.7 (L) 11/29/2021  HGB 12.8 11/29/2021   HCT 38.8 11/29/2021   MCV 88.6 11/29/2021   PLT 160 11/29/2021   NEUTROABS 2.1 11/29/2021    ASSESSMENT & PLAN:  Malignant neoplasm of upper-outer quadrant of left breast in female, estrogen receptor negative (HCC) 06/19/2016: left breast upper outer quadrant and left axillary lymph node biopsy 06/19/2016, both positive for a clinical T2-T3 N1, stage IIIB-C invasive ductal carcinoma, grade 3, triple negative, with an MIB-1 of 90%, BRCA2 mutation 07/20/2016-09/10/2016: Neoadjuvant dose dense AC followed by Taxol carbo 01/27/2017: Left lumpectomy: Complete pathologic response 5 lymph nodes negative 03/11/2017-04/21/2017: Adjuvant radiation 11/24/2019: Left breast recurrence/new primary: Grade 3 IDC ER 80%, HER2 negative, PR negative, Ki-67 90% 01/2020: Bilateral mastectomies: Left: Stage IIb grade 3 IDC weak ER positive 02/19/2020 CMF x1 (stopped due to surgical wound dehiscence), foundation 1: BRCA2 mutation, a PTEN loss of exon 1 and RB1R 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  ERV B2 and PI K3 CA were negative, and the microsatellite status was stable, with low mutational burden (4/MB) Protracted healing from mastectomies Bilateral salpingo-oophorectomy March 2023     Current treatment:  Anastrozole started  04/18/2020 Anastrozole toxicities: Body aches and pains.   Olaparib 08/13/2020-10/09/21    Breast cancer surveillance: 1.  Breast exam 10/12/2022: Benign 2. no role of imaging since she had bilateral mastectomies.   Signatera testing for minimal residual disease: Neg Return to clinic in 1 year for follow-up   Breast Cancer 7 years post-diagnosis, no current complaints or concerns. Regular monitoring with Signatera tests, all of which have been negative. -Continue current monitoring plan with Signatera tests. -Continue Anastrozole, with plans to reassess duration of therapy in the future.  Weight Loss Significant weight loss achieved through diet modification and increased physical activity. Recently underwent abdominoplasty to remove excess skin. -No specific plan needed, commendable progress.  Pain Management Reports occasional use of Tramadol for bone pain, and Gabapentin 300mg  at night. Has been weaning off Gabapentin, with a goal to discontinue. -Continue current pain management regimen. -Consider further weaning of Gabapentin as tolerated.   General Health Maintenance Patient has made significant lifestyle changes and is actively engaged in maintaining and improving her health. -Encourage continued healthy lifestyle habits. -Schedule follow-up appointment in one year.      No orders of the defined types were placed in this encounter.  The patient has a good understanding of the overall plan. she agrees with it. she will call with any problems that may develop before the next visit here. Total time spent: 30 mins including face to face time and time spent for planning, charting and co-ordination of care   Tamsen Meek, MD 10/12/22

## 2022-10-12 NOTE — Assessment & Plan Note (Signed)
06/19/2016: left breast upper outer quadrant and left axillary lymph node biopsy 06/19/2016, both positive for a clinical T2-T3 N1, stage IIIB-C invasive ductal carcinoma, grade 3, triple negative, with an MIB-1 of 90%, BRCA2 mutation 07/20/2016-09/10/2016: Neoadjuvant dose dense AC followed by Taxol carbo 01/27/2017: Left lumpectomy: Complete pathologic response 5 lymph nodes negative 03/11/2017-04/21/2017: Adjuvant radiation 11/24/2019: Left breast recurrence/new primary: Grade 3 IDC ER 80%, HER2 negative, PR negative, Ki-67 90% 01/2020: Bilateral mastectomies: Left: Stage IIb grade 3 IDC weak ER positive 02/19/2020 CMF x1 (stopped due to surgical wound dehiscence), foundation 1: BRCA2 mutation, a PTEN loss of exon 1 and RB1R 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  ERV B2 and PI K3 CA were negative, and the microsatellite status was stable, with low mutational burden (4/MB) Protracted healing from mastectomies Bilateral salpingo-oophorectomy March 2023     Current treatment:  Anastrozole started 04/18/2020 Anastrozole toxicities: Body aches and pains.   Olaparib 08/13/2020-10/09/21    Breast cancer surveillance: 1.  Breast exam 10/12/2022: Benign 2. no role of imaging since she had bilateral mastectomies.   Signatera testing for minimal residual disease: Neg Return to clinic in 1 year for follow-up

## 2022-11-06 HISTORY — PX: GASTROPLASTY: SHX192

## 2022-11-11 ENCOUNTER — Other Ambulatory Visit: Payer: Self-pay

## 2022-11-11 ENCOUNTER — Other Ambulatory Visit (HOSPITAL_COMMUNITY): Payer: Self-pay

## 2022-11-20 ENCOUNTER — Other Ambulatory Visit (HOSPITAL_COMMUNITY): Payer: Self-pay

## 2023-02-03 ENCOUNTER — Other Ambulatory Visit: Payer: Self-pay

## 2023-02-03 ENCOUNTER — Other Ambulatory Visit: Payer: Self-pay | Admitting: Oncology

## 2023-02-03 ENCOUNTER — Encounter: Payer: Self-pay | Admitting: Hematology and Oncology

## 2023-02-03 ENCOUNTER — Other Ambulatory Visit: Payer: Self-pay | Admitting: Hematology and Oncology

## 2023-02-03 ENCOUNTER — Other Ambulatory Visit (HOSPITAL_COMMUNITY): Payer: Self-pay

## 2023-02-03 MED ORDER — TRAMADOL HCL 50 MG PO TABS
50.0000 mg | ORAL_TABLET | Freq: Two times a day (BID) | ORAL | 0 refills | Status: DC | PRN
Start: 2023-02-03 — End: 2023-04-04
  Filled 2023-02-03: qty 60, 30d supply, fill #0

## 2023-02-03 MED ORDER — OXYCODONE HCL 5 MG PO TABS
5.0000 mg | ORAL_TABLET | ORAL | 0 refills | Status: DC | PRN
Start: 2023-02-03 — End: 2023-04-05
  Filled 2023-02-03: qty 30, 5d supply, fill #0

## 2023-02-03 MED ORDER — CEPHALEXIN 500 MG PO CAPS
500.0000 mg | ORAL_CAPSULE | Freq: Four times a day (QID) | ORAL | 0 refills | Status: DC
  Filled 2023-02-03: qty 20, 5d supply, fill #0

## 2023-02-03 MED ORDER — PROMETHAZINE HCL 12.5 MG PO TABS
12.5000 mg | ORAL_TABLET | Freq: Four times a day (QID) | ORAL | 0 refills | Status: DC | PRN
  Filled 2023-02-03: qty 8, 2d supply, fill #0

## 2023-02-03 NOTE — Telephone Encounter (Signed)
Please refill, takes only as needed

## 2023-02-06 HISTORY — PX: COSMETIC SURGERY: SHX468

## 2023-02-08 ENCOUNTER — Other Ambulatory Visit (HOSPITAL_COMMUNITY): Payer: Self-pay

## 2023-02-09 ENCOUNTER — Other Ambulatory Visit: Payer: Self-pay | Admitting: *Deleted

## 2023-02-09 DIAGNOSIS — Z171 Estrogen receptor negative status [ER-]: Secondary | ICD-10-CM

## 2023-02-09 NOTE — Progress Notes (Signed)
Renewal orders placed for Signatera testing.

## 2023-02-19 LAB — SIGNATERA ONLY (NATERA MANAGED)

## 2023-03-10 ENCOUNTER — Inpatient Hospital Stay: Payer: Medicaid Other | Attending: Hematology and Oncology | Admitting: Hematology and Oncology

## 2023-03-10 DIAGNOSIS — Z171 Estrogen receptor negative status [ER-]: Secondary | ICD-10-CM

## 2023-03-10 DIAGNOSIS — C50412 Malignant neoplasm of upper-outer quadrant of left female breast: Secondary | ICD-10-CM

## 2023-03-10 NOTE — Assessment & Plan Note (Signed)
 06/19/2016: left breast upper outer quadrant and left axillary lymph node biopsy 06/19/2016, both positive for a clinical T2-T3 N1, stage IIIB-C invasive ductal carcinoma, grade 3, triple negative, with an MIB-1 of 90%, BRCA2 mutation 07/20/2016-09/10/2016: Neoadjuvant dose dense AC followed by Taxol carbo 01/27/2017: Left lumpectomy: Complete pathologic response 5 lymph nodes negative 03/11/2017-04/21/2017: Adjuvant radiation 11/24/2019: Left breast recurrence/new primary: Grade 3 IDC ER 80%, HER2 negative, PR negative, Ki-67 90% 01/2020: Bilateral mastectomies: Left: Stage IIb grade 3 IDC weak ER positive 02/19/2020 CMF x1 (stopped due to surgical wound dehiscence), foundation 1: BRCA2 mutation, a PTEN loss of exon 1 and RB1R 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  ERV B2 and PI K3 CA were negative, and the microsatellite status was stable, with low mutational burden (4/MB) Protracted healing from mastectomies Bilateral salpingo-oophorectomy March 2023     Current treatment:  Anastrozole started 04/18/2020 Anastrozole toxicities: Body aches and pains.   Olaparib 08/13/2020-10/09/21    Breast cancer surveillance: 1.  Breast exam 10/12/2022: Benign 2. no role of imaging since she had bilateral mastectomies.   July 2024: Signatera testing: Neg February 2025: Insufficient sample for Signatera  Return to clinic in 1 year for follow-up

## 2023-03-10 NOTE — Progress Notes (Signed)
 HEMATOLOGY-ONCOLOGY TELEPHONE VISIT PROGRESS NOTE  I connected with our patient on 03/10/23 at 12:15 PM EST by telephone and verified that I am speaking with the correct person using two identifiers.  I discussed the limitations, risks, security and privacy concerns of performing an evaluation and management service by telephone and the availability of in person appointments.  I also discussed with the patient that there may be a patient responsible charge related to this service. The patient expressed understanding and agreed to proceed.   History of Present Illness:    History of Present Illness Miss [name], a patient with a history of cancer, presents in a stable condition. She reports significant weight loss of ninety-eight pounds since June of the previous year, attributing this to her own efforts. She has undergone a tummy tuck and arm surgery to remove excess skin resulting from the weight loss. She is three and a half weeks post-op from the arm surgery and reports healing well with no complications. She also mentions a recent Natera test, which was unsuccessful due to poor venous access. She has arranged to have the test done at the clinic instead. The patient also discusses her daughter's acceptance into medical school and her own growing social media presence where she shares her health journey.    Oncology History  Malignant neoplasm of upper-outer quadrant of left breast in female, estrogen receptor negative (HCC)  06/19/2016 Initial Diagnosis   status post left breast upper outer quadrant and left axillary lymph node biopsy, both positive for a clinical T2-T3 N1, stage IIIB-C invasive ductal carcinoma, grade 3, triple negative, with an MIB-1 of 90%   07/20/2016 - 12/21/2016 Neo-Adjuvant Chemotherapy   neoadjuvant chemotherapy to consist of doxorubicin and cyclophosphamide in dose dense fashion 4 completed 09/10/2016 to be followed by paclitaxel weekly 12 given with carboplatin               (a) cycle 4 of cyclophosphamide and doxorubicin was delayed 10 days and dose decreased 10% because of febrile neutropenia after cycle 3             (b) cycle 6 of Paclitaxel and Carboplatin delayed due to neutropenia, therefore Granix added to Wednesday, Thursday, Friday following chemotherapy days   08/17/2016 Genetic Testing   BRCA2 c.8169T>A and MSH2 c.2050G>T VUS found on the common hereditary cancer panel.  The Hereditary Gene Panel offered by [facility] includes sequencing and/or deletion duplication testing of the following 46 genes: APC, ATM, AXIN2, BARD1, BMPR1A, BRCA1, BRCA2, BRIP1, CDH1, CDKN2A (p14ARF), CDKN2A (p16INK4a), CHEK2, CTNNA1, DICER1, EPCAM (Deletion/duplication testing only), GREM1 (promoter region deletion/duplication testing only), KIT, MEN1, MLH1, MSH2, MSH3, MSH6, MUTYH, NBN, NF1, NHTL1, PALB2, PDGFRA, PMS2, POLD1, POLE, PTEN, RAD50, RAD51C, RAD51D, SDHB, SDHC, SDHD, SMAD4, SMARCA4. STK11, TP53, TSC1, TSC2, and VHL.  The following genes were evaluated for sequence changes only: SDHA and HOXB13 c.251G>A variant only.  The report date is August 17, 2016.    01/27/2017 Surgery   status post left lumpectomy and left axillary sentinel lymph node sampling with pathology showing a complete pathologic response (ypT0 ypN0); 5 left axillary lymph nodes removed      03/11/2017 - 04/21/2017 Radiation Therapy   adjuvant radiation 03/11/2017-04/21/2017  Site/dose:   1. Left breast, 2 Gy in 25 fractions for a total dose of 50 Gy                      2. Boost, 2 Gy in 5 fractions for a total dose  of 10 Gy     02/19/2020 - 02/19/2020 Chemotherapy   Patient is on Treatment Plan : BREAST Adjuvant CMF IV q21d     Recurrent breast cancer, left (HCC)  12/16/2019 Initial Diagnosis   Recurrent breast cancer, left (HCC)   02/19/2020 - 02/19/2020 Chemotherapy   Patient is on Treatment Plan : BREAST Adjuvant CMF IV q21d     02/19/2020 Cancer Staging   Staging form: Breast, AJCC 8th Edition -  Clinical: Stage Unknown (cT2, cNX, cM0, G2, ER+, PR-, HER2: Equivocal) - Signed by Lowella Dell, MD on 02/19/2020     REVIEW OF SYSTEMS:   Constitutional: Denies fevers, chills or abnormal weight loss All other systems were reviewed with the patient and are negative. Observations/Objective:     Assessment Plan:  Malignant neoplasm of upper-outer quadrant of left breast in female, estrogen receptor negative (HCC) 06/19/2016: left breast upper outer quadrant and left axillary lymph node biopsy 06/19/2016, both positive for a clinical T2-T3 N1, stage IIIB-C invasive ductal carcinoma, grade 3, triple negative, with an MIB-1 of 90%, BRCA2 mutation 07/20/2016-09/10/2016: Neoadjuvant dose dense AC followed by Taxol carbo 01/27/2017: Left lumpectomy: Complete pathologic response 5 lymph nodes negative 03/11/2017-04/21/2017: Adjuvant radiation 11/24/2019: Left breast recurrence/new primary: Grade 3 IDC ER 80%, HER2 negative, PR negative, Ki-67 90% 01/2020: Bilateral mastectomies: Left: Stage IIb grade 3 IDC weak ER positive 02/19/2020 CMF x1 (stopped due to surgical wound dehiscence), foundation 1: BRCA2 mutation, a PTEN loss of exon 1 and RB1R 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  ERV B2 and PI K3 CA were negative, and the microsatellite status was stable, with low mutational burden (4/MB) Protracted healing from mastectomies Bilateral salpingo-oophorectomy March 2023     Current treatment:  Anastrozole started 04/18/2020 Anastrozole toxicities: Body aches and pains.   Olaparib 08/13/2020-10/09/21    Breast cancer surveillance: 1.  Breast exam 10/12/2022: Benign 2. no role of imaging since she had bilateral mastectomies.   July 2024: Signatera testing: Neg February 2025: Insufficient sample for Signatera.  Phlebotomist had a difficult time getting blood.  Therefore she would like to do the Signatera testing at our clinic.  We will set her up for that.  Her daughter who took care of her during her cancer journey has  been accepted to [employer] and due for med school.  She is very excited about that.  Return to clinic in 1 year for follow-up --------------------------------- Assessment and Plan Assessment & Plan BRCA2 mutation BRCA2 mutation increases her risk for breast and pancreatic cancer. No family history of pancreatic cancer.  She does not want to do any MRIs for her pancreas screening.  Post-surgical recovery Recovering well from abdominoplasty and brachioplasty with good healing.  Blood draw difficulty Experienced difficulty with blood draw due to poor venous access and inexperienced personnel. - Arrange blood draw at clinic with experienced personnel.  Follow-up On annual follow-up schedule unless symptoms or concerns arise. - Schedule annual follow-up unless symptoms or concerns arise.      I discussed the assessment and treatment plan with the patient. The patient was provided an opportunity to ask questions and all were answered. The patient agreed with the plan and demonstrated an understanding of the instructions. The patient was advised to call back or seek an in-person evaluation if the symptoms worsen or if the condition fails to improve as anticipated.   I provided 20 minutes of non-face-to-face time during this encounter.  This includes time for charting and coordination of care   Tamsen Meek,  MD

## 2023-03-12 ENCOUNTER — Telehealth: Payer: Self-pay | Admitting: Hematology and Oncology

## 2023-03-12 NOTE — Telephone Encounter (Signed)
 Scheduled appointments per 3/5 los. Patient is aware of the made appointments.

## 2023-03-17 ENCOUNTER — Inpatient Hospital Stay

## 2023-03-24 ENCOUNTER — Encounter: Payer: Self-pay | Admitting: *Deleted

## 2023-03-24 NOTE — Progress Notes (Signed)
 Receive a message from [facility] representative stating they did not receive sample from pt.  Looking back, pt was a no show to our office for [name] lab draw.  Will re-schedule once patient reaches out.

## 2023-04-04 ENCOUNTER — Other Ambulatory Visit: Payer: Self-pay | Admitting: Hematology and Oncology

## 2023-04-05 ENCOUNTER — Other Ambulatory Visit: Payer: Self-pay

## 2023-04-05 ENCOUNTER — Encounter: Payer: Self-pay | Admitting: Hematology and Oncology

## 2023-04-05 ENCOUNTER — Other Ambulatory Visit (HOSPITAL_COMMUNITY): Payer: Self-pay

## 2023-04-05 MED ORDER — TRAMADOL HCL 50 MG PO TABS
50.0000 mg | ORAL_TABLET | Freq: Two times a day (BID) | ORAL | 0 refills | Status: AC | PRN
Start: 2023-04-05 — End: 2023-10-02
  Filled 2023-04-05: qty 60, 30d supply, fill #0

## 2023-04-05 MED ORDER — SCOPOLAMINE 1 MG/3DAYS TD PT72SCOPOLAMINE 1 MG/3DAYS
1.5000 mg | MEDICATED_PATCH | TRANSDERMAL | 0 refills | Status: DC
Start: 2023-04-05 — End: 2023-08-27
  Filled 2023-04-05: qty 1, 1d supply, fill #0

## 2023-04-05 MED ORDER — CEPHALEXIN 500 MG PO CAPS
500.0000 mg | ORAL_CAPSULE | Freq: Four times a day (QID) | ORAL | 0 refills | Status: DC
  Filled 2023-04-05: qty 12, 3d supply, fill #0

## 2023-04-05 MED ORDER — PROMETHAZINE HCL 12.5 MG PO TABS
12.5000 mg | ORAL_TABLET | Freq: Four times a day (QID) | ORAL | 0 refills | Status: DC | PRN
  Filled 2023-04-05: qty 8, 2d supply, fill #0

## 2023-04-05 MED ORDER — OXYCODONE HCL 5 MG PO TABS
5.0000 mg | ORAL_TABLET | ORAL | 0 refills | Status: DC | PRN
  Filled 2023-04-05: qty 30, 5d supply, fill #0

## 2023-04-07 ENCOUNTER — Other Ambulatory Visit (HOSPITAL_COMMUNITY): Payer: Self-pay

## 2023-04-07 MED ORDER — CLINDAMYCIN HCL 300 MG PO CAPS
ORAL_CAPSULE | ORAL | 0 refills | Status: DC
  Filled 2023-04-07: qty 9, 3d supply, fill #0

## 2023-08-23 ENCOUNTER — Inpatient Hospital Stay (HOSPITAL_COMMUNITY)
Admission: EM | Admit: 2023-08-23 | Discharge: 2023-08-27 | DRG: 916 | Disposition: A | Discharge status: Home / Self Care | Attending: Internal Medicine | Admitting: Internal Medicine

## 2023-08-23 DIAGNOSIS — K5900 Constipation, unspecified: Secondary | ICD-10-CM | POA: Diagnosis present

## 2023-08-23 DIAGNOSIS — Z808 Family history of malignant neoplasm of other organs or systems: Secondary | ICD-10-CM

## 2023-08-23 DIAGNOSIS — R509 Fever, unspecified: Secondary | ICD-10-CM | POA: Diagnosis present

## 2023-08-23 DIAGNOSIS — D6959 Other secondary thrombocytopenia: Secondary | ICD-10-CM | POA: Diagnosis not present

## 2023-08-23 DIAGNOSIS — R0602 Shortness of breath: Secondary | ICD-10-CM | POA: Diagnosis present

## 2023-08-23 DIAGNOSIS — E876 Hypokalemia: Secondary | ICD-10-CM | POA: Diagnosis not present

## 2023-08-23 DIAGNOSIS — Z171 Estrogen receptor negative status [ER-]: Secondary | ICD-10-CM

## 2023-08-23 DIAGNOSIS — T782XXA Anaphylactic shock, unspecified, initial encounter: Principal | ICD-10-CM | POA: Diagnosis present

## 2023-08-23 DIAGNOSIS — C50912 Malignant neoplasm of unspecified site of left female breast: Secondary | ICD-10-CM | POA: Diagnosis present

## 2023-08-23 DIAGNOSIS — Z90710 Acquired absence of both cervix and uterus: Secondary | ICD-10-CM

## 2023-08-23 DIAGNOSIS — R0789 Other chest pain: Secondary | ICD-10-CM | POA: Diagnosis present

## 2023-08-23 DIAGNOSIS — G629 Polyneuropathy, unspecified: Secondary | ICD-10-CM | POA: Diagnosis present

## 2023-08-23 DIAGNOSIS — Z800 Family history of malignant neoplasm of digestive organs: Secondary | ICD-10-CM

## 2023-08-23 DIAGNOSIS — Z1152 Encounter for screening for COVID-19: Secondary | ICD-10-CM

## 2023-08-23 DIAGNOSIS — Z833 Family history of diabetes mellitus: Secondary | ICD-10-CM

## 2023-08-23 DIAGNOSIS — Z8261 Family history of arthritis: Secondary | ICD-10-CM

## 2023-08-23 DIAGNOSIS — Z803 Family history of malignant neoplasm of breast: Secondary | ICD-10-CM

## 2023-08-23 DIAGNOSIS — Z9013 Acquired absence of bilateral breasts and nipples: Secondary | ICD-10-CM

## 2023-08-23 DIAGNOSIS — Z923 Personal history of irradiation: Secondary | ICD-10-CM

## 2023-08-23 DIAGNOSIS — A419 Sepsis, unspecified organism: Secondary | ICD-10-CM | POA: Diagnosis present

## 2023-08-23 DIAGNOSIS — Z9221 Personal history of antineoplastic chemotherapy: Secondary | ICD-10-CM

## 2023-08-23 DIAGNOSIS — Z888 Allergy status to other drugs, medicaments and biological substances status: Secondary | ICD-10-CM

## 2023-08-23 DIAGNOSIS — D72819 Decreased white blood cell count, unspecified: Secondary | ICD-10-CM | POA: Diagnosis not present

## 2023-08-23 DIAGNOSIS — E872 Acidosis, unspecified: Secondary | ICD-10-CM | POA: Diagnosis present

## 2023-08-23 DIAGNOSIS — R21 Rash and other nonspecific skin eruption: Principal | ICD-10-CM | POA: Diagnosis present

## 2023-08-23 DIAGNOSIS — Z8249 Family history of ischemic heart disease and other diseases of the circulatory system: Secondary | ICD-10-CM

## 2023-08-23 DIAGNOSIS — Z79899 Other long term (current) drug therapy: Secondary | ICD-10-CM

## 2023-08-23 DIAGNOSIS — Z79811 Long term (current) use of aromatase inhibitors: Secondary | ICD-10-CM

## 2023-08-23 DIAGNOSIS — Z853 Personal history of malignant neoplasm of breast: Secondary | ICD-10-CM

## 2023-08-23 DIAGNOSIS — I10 Essential (primary) hypertension: Secondary | ICD-10-CM | POA: Diagnosis present

## 2023-08-23 DIAGNOSIS — Z8049 Family history of malignant neoplasm of other genital organs: Secondary | ICD-10-CM

## 2023-08-23 MED ORDER — LACTATED RINGERS IV BOLUS
1000.0000 mL | Freq: Once | INTRAVENOUS | Status: AC
  Administered 2023-08-23: 1000 mL via INTRAVENOUS

## 2023-08-23 MED ORDER — METHYLPREDNISOLONE SODIUM SUCC 125 MG IJ SOLR
125.0000 mg | Freq: Once | INTRAMUSCULAR | Status: AC
  Administered 2023-08-23: 125 mg via INTRAVENOUS
  Filled 2023-08-23: qty 2

## 2023-08-23 MED ORDER — EPINEPHRINE 0.3 MG/0.3ML IJ SOAJ
0.3000 mg | INTRAMUSCULAR | Status: DC | PRN
  Filled 2023-08-23: qty 0.3

## 2023-08-23 MED ORDER — FAMOTIDINE IN NACL 20-0.9 MG/50ML-% IV SOLN
20.0000 mg | Freq: Once | INTRAVENOUS | Status: AC
  Administered 2023-08-23: 20 mg via INTRAVENOUS
  Filled 2023-08-23: qty 50

## 2023-08-23 MED ORDER — ONDANSETRON HCL 4 MG/2ML IJ SOLN
4.0000 mg | Freq: Once | INTRAMUSCULAR | Status: AC
  Administered 2023-08-23: 4 mg via INTRAVENOUS
  Filled 2023-08-23: qty 2

## 2023-08-23 NOTE — ED Provider Notes (Signed)
 [place] EMERGENCY DEPARTMENT AT [facility] Provider Note   CSN: [id number] Arrival date & time: 08/23/23  2251     Patient presents with: No chief complaint on file.   [name] is a 53 y.o. female.  {Add pertinent medical, surgical, social history, OB history to YEP:67052} HPI Patient presents for concern of allergic reaction.  Medical history includes breast cancer, arthritis, HTN.  Breast cancer was diagnosed in 2018.  She has completed treatment and is currently on anastrozole .  Earlier today, she was in her normal state of health.  She did not have exposure to any known allergens.  She denies any history of allergic reactions or anaphylaxis.  Fairly prior to arrival, patient developed a tightness in her abdomen, headache, nausea, and a pruritic rash across her chest and abdomen.  She went to the drugstore to get some Benadryl .  She took 50 mg p.o.  On the way home, she had worsening symptoms.  EMS was called.  EMS gave 1 intramuscular epinephrine  shots.  Patient endorses ongoing tightness in lower chest and abdomen, ongoing nausea, and ongoing pruritus.  She denies any sensation of mouth or throat swelling.    Prior to Admission medications   Medication Sig Start Date End Date Taking? Authorizing Provider  anastrozole  (ARIMIDEX ) 1 MG tablet Take 1 tablet (1 mg total) by mouth daily. 06/29/22   Gudena, Vinay, MD  cephALEXin  (KEFLEX ) 500 MG capsule Take 1 capsule (500 mg total) by mouth 4 (four) times daily. 04/05/23     clindamycin  (CLEOCIN ) 300 MG capsule Take 1 capsule by mouth three times a day 04/07/23     docusate sodium  (COLACE) 100 MG capsule Take 100 mg by mouth daily.    [provider]  gabapentin  (NEURONTIN ) 300 MG capsule Take 3 capsules (900 mg total) by mouth at bedtime. Patient taking differently: Take 100 mg by mouth at bedtime. 09/11/22   Gudena, Vinay, MD  oxyCODONE  (OXY IR/ROXICODONE ) 5 MG immediate release tablet Take 1 tablet (5 mg total) by  mouth every 4-6 hours as needed for pain 04/05/23     promethazine  (PHENERGAN ) 12.5 MG tablet Take 1 tablet by mouth every 6 hours as needed for nausea 04/05/23     scopolamine  (TRANSDERM-SCOP) 1 MG/3DAYS Place 1 patch (1.5 mg total) onto the skin as a single dose 04/05/23     traMADol  (ULTRAM ) 50 MG tablet Take 1 tablet (50 mg total) by mouth every 12 (twelve) hours as needed. 04/05/23 10/02/23  Gudena, Vinay, MD  venlafaxine  XR (EFFEXOR -XR) 75 MG 24 hr capsule Take 1 capsule (75 mg total) by mouth daily with breakfast. Patient not taking: Reported on 03/25/2020 02/12/20 03/28/20  Magrinat, Sandria BROCKS, MD    Allergies: Carboplatin     Review of Systems  Gastrointestinal:  Positive for abdominal pain and nausea.  Skin:  Positive for rash.  Neurological:  Positive for tremors and headaches.  All other systems reviewed and are negative.   Updated Vital Signs LMP 06/25/2016   Physical Exam Vitals and nursing note reviewed.  Constitutional:      General: She is not in acute distress.    Appearance: Normal appearance. She is well-developed. She is not toxic-appearing or diaphoretic.  HENT:     Head: Normocephalic and atraumatic.     Right Ear: External ear normal.     Left Ear: External ear normal.     Nose: Nose normal.     Mouth/Throat:     Mouth: Mucous membranes are  moist.     Pharynx: Oropharynx is clear.  Eyes:     Extraocular Movements: Extraocular movements intact.     Conjunctiva/sclera: Conjunctivae normal.  Cardiovascular:     Rate and Rhythm: Regular rhythm. Tachycardia present.     Heart sounds: No murmur heard. Pulmonary:     Effort: Pulmonary effort is normal. No respiratory distress.     Breath sounds: Normal breath sounds. No wheezing or rales.  Chest:     Chest wall: No tenderness.  Abdominal:     General: There is no distension.     Palpations: Abdomen is soft.     Tenderness: There is no abdominal tenderness.  Musculoskeletal:        General: No swelling.      Cervical back: Normal range of motion and neck supple.     Right lower leg: No edema.     Left lower leg: No edema.  Skin:    General: Skin is warm and dry.     Capillary Refill: Capillary refill takes less than 2 seconds.     Findings: Rash (Urticaria throughout chest and abdomen) present.  Neurological:     General: No focal deficit present.     Mental Status: She is alert and oriented to person, place, and time.  Psychiatric:        Mood and Affect: Mood normal.        Behavior: Behavior normal.     (all labs ordered are listed, but only abnormal results are displayed) Labs Reviewed - No data to display  EKG: None  Radiology: No results found.  {Document cardiac monitor, telemetry assessment procedure when appropriate:32947} Procedures   Medications Ordered in the ED - No data to display    {Click here for ABCD2, HEART and other calculators REFRESH Note before signing:1}                              Medical Decision Making Amount and/or Complexity of Data Reviewed Labs: ordered.  Risk Prescription drug management.   This patient presents to the ED for concern of ***, this involves an extensive number of treatment options, and is a complaint that carries with it a high risk of complications and morbidity.  The differential diagnosis includes ***   Co morbidities / Chronic conditions that complicate the patient evaluation  ***   Additional history obtained:  Additional history obtained from EMR External records from outside source obtained and reviewed including ***   Lab Tests:  I Ordered, and personally interpreted labs.  The pertinent results include:  ***   Imaging Studies ordered:  I ordered imaging studies including ***  I independently visualized and interpreted imaging which showed *** I agree with the radiologist interpretation   Cardiac Monitoring: / EKG:  The patient was maintained on a cardiac monitor.  I personally viewed and  interpreted the cardiac monitored which showed an underlying rhythm of: ***   Problem List / ED Course / Critical interventions / Medication management  Patient presenting for acute onset of urticaria, abdominal tightness, nausea, headache.  On arrival in the ED, vital signs notable for tachycardia, tachypnea, low-grade temperature, and hypertension.  She is alert and oriented on exam.  She has diffuse shaking which she states preceded her epinephrine  shot.  Current breathing is unlabored.  Lungs are clear to auscultation.  Patient was placed on monitor.  Fluids, Solu-Medrol , Pepcid  were ordered.  Zofran  was ordered for  nausea.*** I ordered medication including ***   Reevaluation of the patient after these medicines showed that the patient *** I have reviewed the patients home medicines and have made adjustments as needed   Consultations Obtained:  I requested consultation with the ***,  and discussed lab and imaging findings as well as pertinent plan - they recommend: ***   Social Determinants of Health:  ***   Test / Admission - Considered:  ***   {Document critical care time when appropriate  Document review of labs and clinical decision tools ie CHADS2VASC2, etc  Document your independent review of radiology images and any outside records  Document your discussion with family members, caretakers and with consultants  Document social determinants of health affecting pt's care  Document your decision making why or why not admission, treatments were needed:32947:::1}   Final diagnoses:  None    ED Discharge Orders     None

## 2023-08-23 NOTE — ED Triage Notes (Signed)
 Patient allergic reaction BIB EMS has large hives, tight chest.  Pastient stopped and took 2 25 mg Benadryl  prior to EMS arrival and EMSs gave EPI enroute.  No known triggers but has had allergic reaction before and that trigger was not involved.  Patient denies any difficulty breathing and is speaking full sentences.

## 2023-08-24 ENCOUNTER — Encounter (HOSPITAL_COMMUNITY): Payer: Self-pay

## 2023-08-24 ENCOUNTER — Emergency Department (HOSPITAL_COMMUNITY)

## 2023-08-24 ENCOUNTER — Other Ambulatory Visit: Payer: Self-pay

## 2023-08-24 DIAGNOSIS — C50919 Malignant neoplasm of unspecified site of unspecified female breast: Secondary | ICD-10-CM | POA: Diagnosis not present

## 2023-08-24 DIAGNOSIS — R21 Rash and other nonspecific skin eruption: Secondary | ICD-10-CM | POA: Diagnosis present

## 2023-08-24 DIAGNOSIS — D72819 Decreased white blood cell count, unspecified: Secondary | ICD-10-CM | POA: Diagnosis not present

## 2023-08-24 DIAGNOSIS — Z923 Personal history of irradiation: Secondary | ICD-10-CM | POA: Diagnosis not present

## 2023-08-24 DIAGNOSIS — I10 Essential (primary) hypertension: Secondary | ICD-10-CM | POA: Diagnosis present

## 2023-08-24 DIAGNOSIS — A419 Sepsis, unspecified organism: Secondary | ICD-10-CM | POA: Diagnosis present

## 2023-08-24 DIAGNOSIS — Z79899 Other long term (current) drug therapy: Secondary | ICD-10-CM | POA: Diagnosis not present

## 2023-08-24 DIAGNOSIS — Z800 Family history of malignant neoplasm of digestive organs: Secondary | ICD-10-CM | POA: Diagnosis not present

## 2023-08-24 DIAGNOSIS — R6521 Severe sepsis with septic shock: Secondary | ICD-10-CM

## 2023-08-24 DIAGNOSIS — Z171 Estrogen receptor negative status [ER-]: Secondary | ICD-10-CM | POA: Diagnosis not present

## 2023-08-24 DIAGNOSIS — D6959 Other secondary thrombocytopenia: Secondary | ICD-10-CM | POA: Diagnosis not present

## 2023-08-24 DIAGNOSIS — D696 Thrombocytopenia, unspecified: Secondary | ICD-10-CM | POA: Diagnosis not present

## 2023-08-24 DIAGNOSIS — C50912 Malignant neoplasm of unspecified site of left female breast: Secondary | ICD-10-CM | POA: Diagnosis not present

## 2023-08-24 DIAGNOSIS — E872 Acidosis, unspecified: Secondary | ICD-10-CM | POA: Diagnosis present

## 2023-08-24 DIAGNOSIS — K5900 Constipation, unspecified: Secondary | ICD-10-CM | POA: Diagnosis present

## 2023-08-24 DIAGNOSIS — R509 Fever, unspecified: Secondary | ICD-10-CM | POA: Diagnosis not present

## 2023-08-24 DIAGNOSIS — Z833 Family history of diabetes mellitus: Secondary | ICD-10-CM | POA: Diagnosis not present

## 2023-08-24 DIAGNOSIS — E876 Hypokalemia: Secondary | ICD-10-CM | POA: Diagnosis not present

## 2023-08-24 DIAGNOSIS — T782XXA Anaphylactic shock, unspecified, initial encounter: Secondary | ICD-10-CM | POA: Diagnosis present

## 2023-08-24 DIAGNOSIS — Z9221 Personal history of antineoplastic chemotherapy: Secondary | ICD-10-CM | POA: Diagnosis not present

## 2023-08-24 DIAGNOSIS — G629 Polyneuropathy, unspecified: Secondary | ICD-10-CM | POA: Diagnosis present

## 2023-08-24 DIAGNOSIS — Z808 Family history of malignant neoplasm of other organs or systems: Secondary | ICD-10-CM | POA: Diagnosis not present

## 2023-08-24 DIAGNOSIS — Z79811 Long term (current) use of aromatase inhibitors: Secondary | ICD-10-CM | POA: Diagnosis not present

## 2023-08-24 DIAGNOSIS — Z8249 Family history of ischemic heart disease and other diseases of the circulatory system: Secondary | ICD-10-CM | POA: Diagnosis not present

## 2023-08-24 DIAGNOSIS — Z1152 Encounter for screening for COVID-19: Secondary | ICD-10-CM | POA: Diagnosis not present

## 2023-08-24 DIAGNOSIS — Z853 Personal history of malignant neoplasm of breast: Secondary | ICD-10-CM | POA: Diagnosis not present

## 2023-08-24 DIAGNOSIS — Z8049 Family history of malignant neoplasm of other genital organs: Secondary | ICD-10-CM | POA: Diagnosis not present

## 2023-08-24 DIAGNOSIS — Z8261 Family history of arthritis: Secondary | ICD-10-CM | POA: Diagnosis not present

## 2023-08-24 DIAGNOSIS — E8721 Acute metabolic acidosis: Secondary | ICD-10-CM | POA: Diagnosis not present

## 2023-08-24 DIAGNOSIS — Z803 Family history of malignant neoplasm of breast: Secondary | ICD-10-CM | POA: Diagnosis not present

## 2023-08-24 DIAGNOSIS — Z9013 Acquired absence of bilateral breasts and nipples: Secondary | ICD-10-CM | POA: Diagnosis not present

## 2023-08-24 LAB — HEPATIC FUNCTION PANEL
ALT: 15 U/L (ref 0–44)
AST: 23 U/L (ref 15–41)
Albumin: 3.7 g/dL (ref 3.5–5.0)
Alkaline Phosphatase: 64 U/L (ref 38–126)
Bilirubin, Direct: <0.1 mg/dL (ref 0.0–0.2)
Total Bilirubin: 1 mg/dL (ref 0.0–1.2)
Total Protein: 7 g/dL (ref 6.5–8.1)

## 2023-08-24 LAB — LACTIC ACID, PLASMA
Lactic Acid, Venous: 5.1 mmol/L (ref 0.5–1.9)
Lactic Acid, Venous: 2.5 mmol/L (ref 0.5–1.9)

## 2023-08-24 LAB — CBC WITH DIFFERENTIAL/PLATELET
Abs Immature Granulocytes: 0.07 K/uL (ref 0.00–0.07)
Basophils Absolute: 0 K/uL (ref 0.0–0.1)
Basophils Relative: 0 %
Eosinophils Absolute: 0 K/uL (ref 0.0–0.5)
Eosinophils Relative: 0 %
HCT: 38.9 % (ref 36.0–46.0)
Hemoglobin: 12.6 g/dL (ref 12.0–15.0)
Immature Granulocytes: 1 %
Lymphocytes Relative: 5 %
Lymphs Abs: 0.4 K/uL — ABNORMAL LOW (ref 0.7–4.0)
MCH: 28.5 pg (ref 26.0–34.0)
MCHC: 32.4 g/dL (ref 30.0–36.0)
MCV: 88 fL (ref 80.0–100.0)
Monocytes Absolute: 0.5 K/uL (ref 0.1–1.0)
Monocytes Relative: 6 %
Neutro Abs: 7.4 K/uL (ref 1.7–7.7)
Neutrophils Relative %: 88 %
Platelets: 163 K/uL (ref 150–400)
RBC: 4.42 MIL/uL (ref 3.87–5.11)
RDW: 13.3 % (ref 11.5–15.5)
Smear Review: NORMAL
WBC: 8.4 K/uL (ref 4.0–10.5)
nRBC: 0 % (ref 0.0–0.2)

## 2023-08-24 LAB — RESP PANEL BY RT-PCR (RSV, FLU A&B, COVID)  RVPGX2
Influenza A by PCR: NEGATIVE
Influenza B by PCR: NEGATIVE
Resp Syncytial Virus by PCR: NEGATIVE
SARS Coronavirus 2 by RT PCR: NEGATIVE

## 2023-08-24 LAB — BASIC METABOLIC PANEL WITH GFR
Anion gap: 9 (ref 5–15)
BUN: 14 mg/dL (ref 6–20)
CO2: 22 mmol/L (ref 22–32)
Calcium: 8.7 mg/dL — ABNORMAL LOW (ref 8.9–10.3)
Chloride: 103 mmol/L (ref 98–111)
Creatinine, Ser: 0.61 mg/dL (ref 0.44–1.00)
GFR, Estimated: >60 mL/min (ref >60)
Glucose, Bld: 126 mg/dL — ABNORMAL HIGH (ref 70–99)
Potassium: 3.6 mmol/L (ref 3.5–5.1)
Sodium: 134 mmol/L — ABNORMAL LOW (ref 135–145)

## 2023-08-24 LAB — I-STAT CG4 LACTIC ACID, ED
Lactic Acid, Venous: >15 mmol/L (ref 0.5–1.9)
Lactic Acid, Venous: >15 mmol/L (ref 0.5–1.9)

## 2023-08-24 LAB — URINALYSIS, W/ REFLEX TO CULTURE (INFECTION SUSPECTED)
Bilirubin Urine: NEGATIVE
Glucose, UA: NEGATIVE mg/dL
Hgb urine dipstick: NEGATIVE
Ketones, ur: NEGATIVE mg/dL
Nitrite: NEGATIVE
Protein, ur: NEGATIVE mg/dL
Specific Gravity, Urine: <1.005 — ABNORMAL LOW (ref 1.005–1.030)
pH: 5.5 (ref 5.0–8.0)

## 2023-08-24 LAB — MAGNESIUM: Magnesium: 1.7 mg/dL (ref 1.7–2.4)

## 2023-08-24 LAB — LIPASE, BLOOD: Lipase: 57 U/L — ABNORMAL HIGH (ref 11–51)

## 2023-08-24 MED ORDER — ACETAMINOPHEN 325 MG PO TABS
650.0000 mg | ORAL_TABLET | Freq: Four times a day (QID) | ORAL | Status: DC | PRN
  Administered 2023-08-24 – 2023-08-25 (×4): 650 mg via ORAL
  Filled 2023-08-24 (×4): qty 2

## 2023-08-24 MED ORDER — ACETAMINOPHEN 325 MG PO TABS
650.0000 mg | ORAL_TABLET | Freq: Once | ORAL | Status: AC
  Administered 2023-08-24: 650 mg via ORAL
  Filled 2023-08-24: qty 2

## 2023-08-24 MED ORDER — GABAPENTIN 100 MG PO CAPS
200.0000 mg | ORAL_CAPSULE | Freq: Every day | ORAL | Status: DC
  Administered 2023-08-24 – 2023-08-26 (×3): 200 mg via ORAL
  Filled 2023-08-24 (×3): qty 2

## 2023-08-24 MED ORDER — SODIUM CHLORIDE 0.9 % IV SOLN
2.0000 g | Freq: Once | INTRAVENOUS | Status: AC
  Administered 2023-08-24: 2 g via INTRAVENOUS
  Filled 2023-08-24: qty 12.5

## 2023-08-24 MED ORDER — ONDANSETRON HCL 4 MG/2ML IJ SOLN
4.0000 mg | Freq: Four times a day (QID) | INTRAMUSCULAR | Status: DC | PRN
Start: 2023-08-24 — End: 2023-08-27
  Administered 2023-08-24: 4 mg via INTRAVENOUS
  Filled 2023-08-24: qty 2

## 2023-08-24 MED ORDER — ALBUTEROL SULFATE (2.5 MG/3ML) 0.083% IN NEBU: 2.5000 mg | INHALATION_SOLUTION | RESPIRATORY_TRACT | Status: DC | PRN

## 2023-08-24 MED ORDER — HYDROMORPHONE HCL 1 MG/ML IJ SOLN
0.5000 mg | Freq: Once | INTRAMUSCULAR | Status: AC
  Administered 2023-08-24: 0.5 mg via INTRAVENOUS
  Filled 2023-08-24: qty 1

## 2023-08-24 MED ORDER — ACETAMINOPHEN 650 MG RE SUPP: 650.0000 mg | Freq: Four times a day (QID) | RECTAL | Status: DC | PRN

## 2023-08-24 MED ORDER — LACTATED RINGERS IV BOLUS
1000.0000 mL | Freq: Once | INTRAVENOUS | Status: AC
  Administered 2023-08-24: 1000 mL via INTRAVENOUS

## 2023-08-24 MED ORDER — IOHEXOL 350 MG/ML SOLN
100.0000 mL | Freq: Once | INTRAVENOUS | Status: AC | PRN
  Administered 2023-08-24: 100 mL via INTRAVENOUS

## 2023-08-24 MED ORDER — LACTATED RINGERS IV BOLUS (SEPSIS)
1000.0000 mL | Freq: Once | INTRAVENOUS | Status: AC
  Administered 2023-08-24: 1000 mL via INTRAVENOUS

## 2023-08-24 MED ORDER — ONDANSETRON HCL 4 MG PO TABS: 4.0000 mg | ORAL_TABLET | Freq: Four times a day (QID) | ORAL | Status: DC | PRN

## 2023-08-24 MED ORDER — LACTATED RINGERS IV SOLN: INTRAVENOUS | Status: AC

## 2023-08-24 MED ORDER — DOCUSATE SODIUM 100 MG PO CAPS
200.0000 mg | ORAL_CAPSULE | Freq: Every day | ORAL | Status: DC
  Administered 2023-08-24 – 2023-08-26 (×3): 200 mg via ORAL
  Filled 2023-08-24 (×3): qty 2

## 2023-08-24 MED ORDER — VANCOMYCIN HCL IN DEXTROSE 1-5 GM/200ML-% IV SOLN
1000.0000 mg | Freq: Two times a day (BID) | INTRAVENOUS | Status: DC
  Administered 2023-08-24 – 2023-08-26 (×4): 1000 mg via INTRAVENOUS
  Filled 2023-08-24 (×4): qty 200

## 2023-08-24 MED ORDER — METRONIDAZOLE 500 MG/100ML IV SOLN
500.0000 mg | Freq: Two times a day (BID) | INTRAVENOUS | Status: DC
  Administered 2023-08-24 – 2023-08-26 (×4): 500 mg via INTRAVENOUS
  Filled 2023-08-24 (×4): qty 100

## 2023-08-24 MED ORDER — VANCOMYCIN HCL 1500 MG/300ML IV SOLN
1500.0000 mg | Freq: Once | INTRAVENOUS | Status: AC
  Administered 2023-08-24: 1500 mg via INTRAVENOUS
  Filled 2023-08-24: qty 300

## 2023-08-24 MED ORDER — VANCOMYCIN HCL IN DEXTROSE 1-5 GM/200ML-% IV SOLN
1000.0000 mg | Freq: Once | INTRAVENOUS | Status: AC
  Administered 2023-08-24: 1000 mg via INTRAVENOUS
  Filled 2023-08-24: qty 200

## 2023-08-24 MED ORDER — METRONIDAZOLE 500 MG/100ML IV SOLN
500.0000 mg | Freq: Once | INTRAVENOUS | Status: AC
  Administered 2023-08-24: 500 mg via INTRAVENOUS
  Filled 2023-08-24: qty 100

## 2023-08-24 MED ORDER — OXYCODONE HCL 5 MG PO TABS
5.0000 mg | ORAL_TABLET | ORAL | Status: DC | PRN
  Administered 2023-08-24: 5 mg via ORAL
  Filled 2023-08-24: qty 1

## 2023-08-24 MED ORDER — ORAL CARE MOUTH RINSE: 15.0000 mL | OROMUCOSAL | Status: DC | PRN

## 2023-08-24 MED ORDER — TRAZODONE HCL 50 MG PO TABS
25.0000 mg | ORAL_TABLET | Freq: Every evening | ORAL | Status: DC | PRN
  Filled 2023-08-24: qty 1

## 2023-08-24 MED ORDER — ENOXAPARIN SODIUM 40 MG/0.4ML IJ SOSY
40.0000 mg | PREFILLED_SYRINGE | INTRAMUSCULAR | Status: DC
  Administered 2023-08-24 – 2023-08-27 (×4): 40 mg via SUBCUTANEOUS
  Filled 2023-08-24 (×4): qty 0.4

## 2023-08-24 MED ORDER — SODIUM CHLORIDE 0.9 % IV SOLN
2.0000 g | Freq: Three times a day (TID) | INTRAVENOUS | Status: DC
  Administered 2023-08-24 – 2023-08-26 (×6): 2 g via INTRAVENOUS
  Filled 2023-08-24 (×6): qty 12.5

## 2023-08-24 NOTE — Progress Notes (Signed)
 Pharmacy Antibiotic Note  [name] is a 53 y.o. female admitted on 08/23/2023 with sepsis. Pharmacy has been consulted for vancomycin  and cefepime  dosing.  Plan: Cefepime  2g IV q8H  Vancomycin  1500mg  IV once, followed by 1000mg  IV q12h (eAUC 508, Scr 0.8, IBW, Vd 0.72) Monitor renal function, clinical status Follow up LOT, ability to de-escalate  Weight: 78.9 kg (174 lb)  Temp (24hrs), Avg:99.5 F (37.5 C), Min:97.8 F (36.6 C), Max:100.9 F (38.3 C)  Recent Labs  Lab 08/24/23 0009 08/24/23 0301 08/24/23 0309 08/24/23 0434 08/24/23 0642  WBC 8.4  --   --   --   --   CREATININE 0.61  --   --   --   --   LATICACIDVEN  --  >15.0* >15.0* 5.1* 2.5*    CrCl cannot be calculated (Unknown ideal weight.).    Allergies  Allergen Reactions   Carboplatin  Other (See Comments)    Red, blotchy, hot    Antimicrobials this admission: Vanc 8/19>> Cefepime  8/19>> Flagyl  8/19 >>  Dose adjustments this admission: none  Microbiology results: 8/19 BCx: sent  8/19 Resp PCR: neg  Thank you for allowing pharmacy to be a part of this patient's care.  Dreon Pineda 08/24/2023 8:11 AM

## 2023-08-24 NOTE — ED Notes (Signed)
 For any communication, if husband does not answer phone, please contact daughter Jayla.  Added her number to contacts in event of need.

## 2023-08-24 NOTE — ED Notes (Signed)
 Epi pen placed at bedside.

## 2023-08-24 NOTE — H&P (Signed)
 History and Physical  [name] FMW:[id number] DOB: [birth year] DOA: 08/23/2023  PCP: Vicci Barnie NOVAK, MD   Chief Complaint: Abdominal itching, nausea and fever  HPI: [name] is a 53 y.o. female with medical history significant for breast cancer status postmastectomy and chemoradiation who is being admitted to the hospital with septic shock of unclear etiology.  Patient states that she was completely in her usual state of health with no new medications or other exposures or foods when she had sudden onset yesterday afternoon of red warm confluent area of itching on her upper abdomen.  This lasted for about an hour, then she started feeling very nauseous and lightheaded.  She also started to feel some tightness in her chest, shortness of breath, and feel anxious.  She called EMS, she took 50 mg p.o. Benadryl  prior to EMS, EMS gave epi en route.  She was never hypoxic, vital signs were stable upon arrival, after this she developed fever was found to had lactate greater than 15.  She was given IV Benadryl , IV famotidine , IV Solu-Medrol .  She remained stable, she was given broad-spectrum empiric IV antibiotics, copious IV fluids and now lactic acid has improved to 2.5.  Currently she is resting comfortably and completely asymptomatic, her rash is almost completely resolved.  Review of Systems: Please see HPI for pertinent positives and negatives. A complete 10 system review of systems are otherwise negative.  Past Medical History:  Diagnosis Date   Arthritis    Breast cancer (HCC) 11/24/2019   left    Complication of anesthesia    DAY SURGERY 2010 OR 2011 ASPIRATED AND STAYED OVERNIGHT   History of radiation therapy 03/11/17- 04/21/17   Left breast, 2 Gy in 25 fractions for a total dose of 50 Gy, Boost, 2 Gy in 5 fractions for a total dose of 10 Gy   Hypertension    OFF MEDS  SINCE CHEMO X 5 MONTHS   Malignant neoplasm of upper-outer quadrant of left breast in female, estrogen  receptor negative (HCC) 06/23/2016   Past Surgical History:  Procedure Laterality Date   ANKLE SURGERY Right    BREAST CYST EXCISION Right 07/15/2020   Procedure: Excision excess right axillary tissue;  Surgeon: Elisabeth Craig RAMAN, MD;  Location: [facility];  Service: Plastics;  Laterality: Right;   BREAST LUMPECTOMY Left 01/27/2017   BREAST LUMPECTOMY WITH RADIOACTIVE SEED AND SENTINEL LYMPH NODE BIOPSY Left 01/27/2017   Procedure: LEFT BREAST LUMPECTOMY WITH RADIOACTIVE SEED LOCALIZATION, LEFT AXILLARY DEEP LYMPH NODE BIOPSY WITH RADIOACTIVE SEED LOCALIZATION, LEFT AXILLARY SENTINEL LYMPH NODE MAPPING AND BIOPSY WITH BLUE DYE INJECTION;  Surgeon: Gail Favorite, MD;  Location: [facility];  Service: General;  Laterality: Left;   COLONOSCOPY WITH PROPOFOL  N/A 02/01/2020   Procedure: COLONOSCOPY WITH PROPOFOL ;  Surgeon: Teressa Toribio SQUIBB, MD;  Location: [facility];  Service: Endoscopy;  Laterality: N/A;   DEBRIDEMENT AND CLOSURE WOUND Left 03/28/2020   Procedure: DEBRIDEMENT AND CLOSURE OF MASTECTOMY WOUND;  Surgeon: Ebbie Cough, MD;  Location: [facility];  Service: General;  Laterality: Left;   DEBRIDEMENT AND CLOSURE WOUND Left 07/15/2020   Procedure: Debridement of left chest wound and rotational flap closure;  Surgeon: Elisabeth Craig RAMAN, MD;  Location: [facility] OR;  Service: Plastics;  Laterality: Left;  1.5 hour   ENDOSCOPIC MUCOSAL RESECTION  02/01/2020   Procedure: ENDOSCOPIC MUCOSAL RESECTION;  Surgeon: Teressa Toribio SQUIBB, MD;  Location: [facility];  Service: Endoscopy;;   HEMOSTASIS CLIP PLACEMENT  02/01/2020   Procedure:  HEMOSTASIS CLIP PLACEMENT;  Surgeon: Teressa Toribio SQUIBB, MD;  Location: [facility];  Service: Endoscopy;;   INCISIONAL HERNIA REPAIR N/A 02/26/2021   Procedure: LAPAROSCOPIC INCISIONAL HERNIA WITH MESH;  Surgeon: Ebbie Cough, MD;  Location: [facility];  Service: General;  Laterality: N/A;   IR FLUORO GUIDE PORT INSERTION RIGHT  07/14/2016   IR US  GUIDE VASC ACCESS RIGHT  07/14/2016    MASTECTOMY W/ SENTINEL NODE BIOPSY Bilateral 01/09/2020   Procedure: BILATERAL MASTECTOMY WITH LEFT AXILLARY SENTINEL LYMPH NODE BIOPSY;  Surgeon: Ebbie Cough, MD;  Location: [name];  Service: General;  Laterality: Bilateral;   PORT-A-CATH REMOVAL Right 01/27/2017   Procedure: REMOVAL PORT-A-CATH;  Surgeon: Gail Favorite, MD;  Location: [facility] OR;  Service: General;  Laterality: Right;   PORT-A-CATH REMOVAL Right 02/26/2021   Procedure: REMOVAL PORT-A-CATH;  Surgeon: Ebbie Cough, MD;  Location: [facility];  Service: General;  Laterality: Right;   PORTACATH PLACEMENT Right 01/09/2020   Procedure: INSERTION PORT-A-CATH;  Surgeon: Ebbie Cough, MD;  Location: [facility];  Service: General;  Laterality: Right;   ROBOTIC ASSISTED TOTAL HYSTERECTOMY WITH BILATERAL SALPINGO OOPHERECTOMY N/A 02/26/2021   Procedure: XI ROBOTIC ASSISTED BILATERAL SALPINGO OOPHORECTOMY;  Surgeon: Viktoria Comer SAUNDERS, MD;  Location: [facility];  Service: Gynecology;  Laterality: N/A;   SUBMUCOSAL LIFTING INJECTION  02/01/2020   Procedure: SUBMUCOSAL LIFTING INJECTION;  Surgeon: Teressa Toribio SQUIBB, MD;  Location: [facility];  Service: Endoscopy;;   TUBAL LIGATION     WISDOM TOOTH EXTRACTION     WOUND EXPLORATION N/A 10/01/2022   Procedure: ABDOMINAL EXPLORATION;  Surgeon: Lorretta Dess, MD;  Location: [facility];  Service: Plastics;  Laterality: N/A;   Social History:  reports that she has never smoked. She has never used smokeless tobacco. She reports current alcohol use. She reports that she does not use drugs.  Allergies  Allergen Reactions   Carboplatin  Other (See Comments)    Red, blotchy, hot    Family History  Problem Relation Age of Onset   Diabetes Mother    Hypertension Mother    Arthritis Mother    Colon cancer Father 46       died .76 metastatic at time of diagnosis   Diabetes Father    Heart disease Father    Hypertension Father    Breast cancer Maternal  Grandmother 56       d.60s   Breast cancer Maternal Aunt 52   Cervical cancer Maternal Aunt 55   Breast cancer Maternal Aunt 46       d.50s   Cancer Maternal Uncle        d.62s unspecified type of cancer   Breast cancer Cousin 96       paternal first-cousin (daughter of unaffected aunt)   Cancer Maternal Aunt 75       Female Cancer   Cancer Maternal Aunt        unknown cancer   Brain cancer Daughter 32   Esophageal cancer Other    Colon polyps Neg Hx    Rectal cancer Neg Hx    Stomach cancer Neg Hx      Prior to Admission medications   Medication Sig Start Date End Date Taking? Authorizing Provider  anastrozole  (ARIMIDEX ) 1 MG tablet Take 1 tablet (1 mg total) by mouth daily. 06/29/22   Gudena, Vinay, MD  cephALEXin  (KEFLEX ) 500 MG capsule Take 1 capsule (500 mg total) by mouth 4 (four) times daily. 04/05/23     clindamycin  (CLEOCIN ) 300  MG capsule Take 1 capsule by mouth three times a day 04/07/23     docusate sodium  (COLACE) 100 MG capsule Take 100 mg by mouth daily.    [provider]  gabapentin  (NEURONTIN ) 300 MG capsule Take 3 capsules (900 mg total) by mouth at bedtime. Patient taking differently: Take 100 mg by mouth at bedtime. 09/11/22   Gudena, Vinay, MD  oxyCODONE  (OXY IR/ROXICODONE ) 5 MG immediate release tablet Take 1 tablet (5 mg total) by mouth every 4-6 hours as needed for pain 04/05/23     promethazine  (PHENERGAN ) 12.5 MG tablet Take 1 tablet by mouth every 6 hours as needed for nausea 04/05/23     scopolamine  (TRANSDERM-SCOP) 1 MG/3DAYS Place 1 patch (1.5 mg total) onto the skin as a single dose 04/05/23     traMADol  (ULTRAM ) 50 MG tablet Take 1 tablet (50 mg total) by mouth every 12 (twelve) hours as needed. 04/05/23 10/02/23  Odean Potts, MD  venlafaxine  XR (EFFEXOR -XR) 75 MG 24 hr capsule Take 1 capsule (75 mg total) by mouth daily with breakfast. Patient not taking: Reported on 03/25/2020 02/12/20 03/28/20  Magrinat, Sandria BROCKS, MD    Physical Exam: BP 122/76    Pulse 92   Temp 97.8 F (36.6 C) (Oral)   Resp 16   Wt 78.9 kg   LMP 06/25/2016   SpO2 96%   BMI 26.85 kg/m  General:  Alert, oriented, calm, in no acute distress, pleasant and cooperative resting comfortably on room air Cardiovascular: RRR, no murmurs or rubs, no peripheral edema  Respiratory: clear to auscultation bilaterally, no wheezes, no crackles  Abdomen: soft, nontender, nondistended, normal bowel tones heard  Skin: dry, confluent area of erythema without induration, no significant warmth, no tenderness on the upper abdomen; when compared to photographs on the patient's phone, significantly improved Musculoskeletal: no joint effusions, normal range of motion  Psychiatric: appropriate affect, normal speech  Neurologic: extraocular muscles intact, clear speech, moving all extremities with intact sensorium         Labs on Admission:  Basic Metabolic Panel: Recent Labs  Lab 08/24/23 0009 08/24/23 0100  NA 134*  --   K 3.6  --   CL 103  --   CO2 22  --   GLUCOSE 126*  --   BUN 14  --   CREATININE 0.61  --   CALCIUM 8.7*  --   MG  --  1.7   Liver Function Tests: Recent Labs  Lab 08/24/23 0100  AST 23  ALT 15  ALKPHOS 64  BILITOT 1.0  PROT 7.0  ALBUMIN  3.7   Recent Labs  Lab 08/24/23 0100  LIPASE 57*   No results for input(s): AMMONIA in the last 168 hours. CBC: Recent Labs  Lab 08/24/23 0009  WBC 8.4  NEUTROABS 7.4  HGB 12.6  HCT 38.9  MCV 88.0  PLT 163   Cardiac Enzymes: No results for input(s): CKTOTAL, CKMB, CKMBINDEX, TROPONINI in the last 168 hours. BNP (last 3 results) No results for input(s): BNP in the last 8760 hours.  ProBNP (last 3 results) No results for input(s): PROBNP in the last 8760 hours.  CBG: No results for input(s): GLUCAP in the last 168 hours.  Radiological Exams on Admission: CT Angio Chest PE W and/or Wo Contrast Result Date: 08/24/2023 CLINICAL DATA:  Treated for severe allergic reaction in the  field by M mass, on arrival with shortness of breath and chest tightness. Concern for pulmonary embolism. Also with abdominal pain,  acute, nonlocalized. EXAM: CT ANGIOGRAPHY CHEST CT ABDOMEN AND PELVIS WITH CONTRAST TECHNIQUE: Multidetector CT imaging of the chest was performed using the standard protocol during bolus administration of intravenous contrast. Multiplanar CT image reconstructions and MIPs were obtained to evaluate the vascular anatomy. Multidetector CT imaging of the abdomen and pelvis was performed using the standard protocol during bolus administration of intravenous contrast. RADIATION DOSE REDUCTION: This exam was performed according to the departmental dose-optimization program which includes automated exposure control, adjustment of the mA and/or kV according to patient size and/or use of iterative reconstruction technique. CONTRAST:  OMNIPAQUE  IOHEXOL  350 MG/ML SOLN COMPARISON:  Chest CTs with IV contrast 12/04/2021 and 07/25/2016, CT abdomen pelvis with IV contrast 03/02/2021. FINDINGS: CTA CHEST FINDINGS Cardiovascular: The cardiac size is normal. There is no pericardial effusion. The pulmonary trunk is prominent measuring 3.3 cm indicating arterial hypertension, previously 3.4 cm. No arterial embolism is seen. The aorta and great vessels demonstrate no aneurysm, stenosis or dissection. There are mild calcific plaques in the aortic isthmus. Pulmonary veins are normal in caliber. There are no visible coronary calcifications. Mediastinum/Nodes: The lower poles of thyroid gland, axillary spaces, thoracic trachea, main bronchi, and thoracic esophagus are unremarkable. No intrathoracic adenopathy is seen. Lungs/Pleura: There is post XRT subpleural reticulation anteriorly in the left upper lobe. Central airways clear. Previously there were ground-glass nodules in the left lung which have cleared. There are mild posterior atelectatic changes in both lungs. No consolidation, effusion, nodules or  pneumothorax are seen. Musculoskeletal: Status post bilateral mastectomy. No chest wall mass is seen. No regional bone metastasis is seen. Review of the MIP images confirms the above findings. CT ABDOMEN and PELVIS FINDINGS Hepatobiliary: Solitary 1.5 cm stone in the gallbladder. No wall thickening. No bile duct dilatation. No focal liver abnormality is seen. Pancreas: No abnormality. Spleen: The spleen mildly prominent 14.3 cm AP, unchanged.  No mass. Adrenals/Urinary Tract: Adrenal glands are unremarkable. Kidneys are normal, without renal calculi, focal lesion, or hydronephrosis. Bladder is unremarkable. Stomach/Bowel: No dilatation or wall thickening including the appendix. Vascular/Lymphatic: Aortic atherosclerosis. No enlarged abdominal or pelvic lymph nodes. Reproductive: Uterus and bilateral adnexa are unremarkable. Other: Small umbilical fat hernia. No incarcerated hernia. No free fluid, free air, or focal inflammatory process. Multiple pelvic phleboliths. Musculoskeletal: No regional bone metastasis is seen. Degenerative change noted lumbar spine with mild lumbar levoscoliosis. Early hip DJD. Review of the MIP images confirms the above findings. IMPRESSION: 1. No acute chest CT or CTA findings. 2. Prominent pulmonary trunk indicating arterial hypertension. No arterial embolus is seen. 3. Aortic atherosclerosis. 4. Cholelithiasis without evidence of cholecystitis. 5. Stable mild splenomegaly. 6. Small umbilical fat hernia. Aortic Atherosclerosis (ICD10-I70.0). Electronically Signed   By: Francis Quam M.D.   On: 08/24/2023 06:08   CT ABDOMEN PELVIS W CONTRAST Result Date: 08/24/2023 CLINICAL DATA:  Treated for severe allergic reaction in the field by M mass, on arrival with shortness of breath and chest tightness. Concern for pulmonary embolism. Also with abdominal pain, acute, nonlocalized. EXAM: CT ANGIOGRAPHY CHEST CT ABDOMEN AND PELVIS WITH CONTRAST TECHNIQUE: Multidetector CT imaging of the chest  was performed using the standard protocol during bolus administration of intravenous contrast. Multiplanar CT image reconstructions and MIPs were obtained to evaluate the vascular anatomy. Multidetector CT imaging of the abdomen and pelvis was performed using the standard protocol during bolus administration of intravenous contrast. RADIATION DOSE REDUCTION: This exam was performed according to the departmental dose-optimization program which includes automated exposure control, adjustment of  the mA and/or kV according to patient size and/or use of iterative reconstruction technique. CONTRAST:  OMNIPAQUE  IOHEXOL  350 MG/ML SOLN COMPARISON:  Chest CTs with IV contrast 12/04/2021 and 07/25/2016, CT abdomen pelvis with IV contrast 03/02/2021. FINDINGS: CTA CHEST FINDINGS Cardiovascular: The cardiac size is normal. There is no pericardial effusion. The pulmonary trunk is prominent measuring 3.3 cm indicating arterial hypertension, previously 3.4 cm. No arterial embolism is seen. The aorta and great vessels demonstrate no aneurysm, stenosis or dissection. There are mild calcific plaques in the aortic isthmus. Pulmonary veins are normal in caliber. There are no visible coronary calcifications. Mediastinum/Nodes: The lower poles of thyroid gland, axillary spaces, thoracic trachea, main bronchi, and thoracic esophagus are unremarkable. No intrathoracic adenopathy is seen. Lungs/Pleura: There is post XRT subpleural reticulation anteriorly in the left upper lobe. Central airways clear. Previously there were ground-glass nodules in the left lung which have cleared. There are mild posterior atelectatic changes in both lungs. No consolidation, effusion, nodules or pneumothorax are seen. Musculoskeletal: Status post bilateral mastectomy. No chest wall mass is seen. No regional bone metastasis is seen. Review of the MIP images confirms the above findings. CT ABDOMEN and PELVIS FINDINGS Hepatobiliary: Solitary 1.5 cm stone in  the gallbladder. No wall thickening. No bile duct dilatation. No focal liver abnormality is seen. Pancreas: No abnormality. Spleen: The spleen mildly prominent 14.3 cm AP, unchanged.  No mass. Adrenals/Urinary Tract: Adrenal glands are unremarkable. Kidneys are normal, without renal calculi, focal lesion, or hydronephrosis. Bladder is unremarkable. Stomach/Bowel: No dilatation or wall thickening including the appendix. Vascular/Lymphatic: Aortic atherosclerosis. No enlarged abdominal or pelvic lymph nodes. Reproductive: Uterus and bilateral adnexa are unremarkable. Other: Small umbilical fat hernia. No incarcerated hernia. No free fluid, free air, or focal inflammatory process. Multiple pelvic phleboliths. Musculoskeletal: No regional bone metastasis is seen. Degenerative change noted lumbar spine with mild lumbar levoscoliosis. Early hip DJD. Review of the MIP images confirms the above findings. IMPRESSION: 1. No acute chest CT or CTA findings. 2. Prominent pulmonary trunk indicating arterial hypertension. No arterial embolus is seen. 3. Aortic atherosclerosis. 4. Cholelithiasis without evidence of cholecystitis. 5. Stable mild splenomegaly. 6. Small umbilical fat hernia. Aortic Atherosclerosis (ICD10-I70.0). Electronically Signed   By: Francis Quam M.D.   On: 08/24/2023 06:08   DG Chest Port 1 View Result Date: 08/24/2023 CLINICAL DATA:  Questionable sepsis-evaluate for abnormality EXAM: PORTABLE CHEST 1 VIEW COMPARISON:  12/01/2021 FINDINGS: The heart size and mediastinal contours are within normal limits. Both lungs are clear. The visualized skeletal structures are unremarkable. IMPRESSION: No active disease. Electronically Signed   By: Norman Gatlin M.D.   On: 08/24/2023 01:24   Assessment/Plan MELYNA HURON is a 53 y.o. female with medical history significant for breast cancer status postmastectomy and chemoradiation who is being admitted to the hospital with septic shock of unclear  etiology.  Septic shock-with fever, tachycardia, initial lactate greater than 15.  Differential includes allergic reaction versus sudden acute bacterial infection.  Workup is unrevealing for possible source. -Inpatient admission -Monitor on progressive -Continue IV fluids -Empiric IV vancomycin , IV cefepime , IV Flagyl  -Follow-up blood cultures  Neuropathy-continue home dose gabapentin   DVT prophylaxis: Lovenox      Code Status: Full Code  Consults called: None  Admission status: The appropriate patient status for this patient is INPATIENT. Inpatient status is judged to be reasonable and necessary in order to provide the required intensity of service to ensure the patient's safety. The patient's presenting symptoms, physical exam findings, and initial  radiographic and laboratory data in the context of their chronic comorbidities is felt to place them at high risk for further clinical deterioration. Furthermore, it is not anticipated that the patient will be medically stable for discharge from the hospital within 2 midnights of admission.    I certify that at the point of admission it is my clinical judgment that the patient will require inpatient hospital care spanning beyond 2 midnights from the point of admission due to high intensity of service, high risk for further deterioration and high frequency of surveillance required  Time spent: 59 minutes  Brittiney Dicostanzo CHRISTELLA Gail MD [facility] Pager [phone]  If 7PM-7AM, please contact night-coverage [email or web address] Password [email or web address]  08/24/2023, 8:00 AM

## 2023-08-24 NOTE — ED Notes (Signed)
 Melvenia, MD made aware of critical mini lab value.

## 2023-08-25 DIAGNOSIS — C50919 Malignant neoplasm of unspecified site of unspecified female breast: Secondary | ICD-10-CM

## 2023-08-25 DIAGNOSIS — R21 Rash and other nonspecific skin eruption: Secondary | ICD-10-CM

## 2023-08-25 DIAGNOSIS — E876 Hypokalemia: Secondary | ICD-10-CM

## 2023-08-25 DIAGNOSIS — A419 Sepsis, unspecified organism: Secondary | ICD-10-CM | POA: Diagnosis not present

## 2023-08-25 LAB — CBC
HCT: 37 % (ref 36.0–46.0)
Hemoglobin: 11.6 g/dL — ABNORMAL LOW (ref 12.0–15.0)
MCH: 28.4 pg (ref 26.0–34.0)
MCHC: 31.4 g/dL (ref 30.0–36.0)
MCV: 90.5 fL (ref 80.0–100.0)
Platelets: 109 K/uL — ABNORMAL LOW (ref 150–400)
RBC: 4.09 MIL/uL (ref 3.87–5.11)
RDW: 13.5 % (ref 11.5–15.5)
WBC: 4.4 K/uL (ref 4.0–10.5)
nRBC: 0 % (ref 0.0–0.2)

## 2023-08-25 LAB — BASIC METABOLIC PANEL WITH GFR
Anion gap: 7 (ref 5–15)
BUN: 11 mg/dL (ref 6–20)
CO2: 25 mmol/L (ref 22–32)
Calcium: 8.6 mg/dL — ABNORMAL LOW (ref 8.9–10.3)
Chloride: 106 mmol/L (ref 98–111)
Creatinine, Ser: 0.59 mg/dL (ref 0.44–1.00)
GFR, Estimated: >60 mL/min (ref >60)
Glucose, Bld: 117 mg/dL — ABNORMAL HIGH (ref 70–99)
Potassium: 3 mmol/L — ABNORMAL LOW (ref 3.5–5.1)
Sodium: 138 mmol/L (ref 135–145)

## 2023-08-25 LAB — HIV ANTIBODY (ROUTINE TESTING W REFLEX): HIV Screen 4th Generation wRfx: NONREACTIVE

## 2023-08-25 MED ORDER — IBUPROFEN 200 MG PO TABS
400.0000 mg | ORAL_TABLET | Freq: Once | ORAL | Status: AC
  Administered 2023-08-25: 400 mg via ORAL
  Filled 2023-08-25: qty 2

## 2023-08-25 MED ORDER — OXYCODONE HCL 5 MG PO TABS: 5.0000 mg | ORAL_TABLET | ORAL | Status: DC | PRN

## 2023-08-25 MED ORDER — POTASSIUM CHLORIDE CRYS ER 20 MEQ PO TBCR
40.0000 meq | EXTENDED_RELEASE_TABLET | Freq: Two times a day (BID) | ORAL | Status: AC
Start: 2023-08-25 — End: 2023-08-25
  Administered 2023-08-25 (×2): 40 meq via ORAL
  Filled 2023-08-25 (×2): qty 2

## 2023-08-25 NOTE — Progress Notes (Signed)
 Progress Note   Patient: [name] FMW:[id number] DOB: 1970/11/05 DOA: 08/23/2023     1 DOS: the patient was seen and examined on 08/25/2023   Brief hospital course: [name] is a 54 y.o. female with medical history significant for breast cancer status postmastectomy and chemoradiation presented with sudden onset of large area, maculopapular rash on her abdomen  initially treated for allergic reaction noted to have fever, elevated lactic acid, tachycardic got empiric broad spectrum antibiotics admitted to [facility] service for septic shock of unclear etiology.  Assessment and Plan: Septic shock-with fever, tachycardia, initial lactate greater than 15.   Differential includes allergic reaction versus sudden acute bacterial infection.   IV fluids per sepsis protocol. Lactic acid improved. WBC normal. Workup is unrevealing for possible source. ID consulted. Continue Empiric IV vancomycin , IV cefepime , IV Flagyl . Follow-up blood cultures. Continue to monitor vitals closely.   Hypokalemia- Continue oral potassium supplements.   Neuropathy-continue home dose gabapentin .    Out of bed to chair. Incentive spirometry. Nursing supportive care. Fall, aspiration precautions. Diet:  Diet Orders (From admission, onward)     Start     Ordered   08/24/23 0801  Diet regular Room service appropriate? Yes; Fluid consistency: Thin  Diet effective now       Question Answer Comment  Room service appropriate? Yes   Fluid consistency: Thin      08/24/23 0800           DVT prophylaxis: enoxaparin  (LOVENOX ) injection 40 mg Start: 08/24/23 1000  Level of care: Progressive   Code Status: Full Code  Subjective: Patient is seen and examined today morning. She is feeling better. Has abdominal rash better. Complains of headache.   Physical Exam: Vitals:   08/24/23 2007 08/24/23 2315 08/25/23 0445 08/25/23 1334  BP: 118/65 133/79 123/67 (!) 118/58  Pulse: 89 91 91 86  Resp: 18 18 16 16    Temp: 97.7 F (36.5 C) 99.1 F (37.3 C) 98 F (36.7 C) 98.3 F (36.8 C)  TempSrc: Oral Oral  Oral  SpO2: 97% 100% 97% 97%  Weight:      Height:        General -  Middle aged Caucasian female, no apparent distress HEENT - PERRLA, EOMI, atraumatic head, non tender sinuses. Lung - Clear, diffuse rales, no rhonchi, wheezes. Heart - S1, S2 heard, no murmurs, rubs, trace pedal edema. Abdomen - Soft, non tender, maculo papular rash over abdominal wall., bowel sounds good Neuro - Alert, awake and oriented x 3, non focal exam. Skin - Warm and dry.  Data Reviewed:      Latest Ref Rng & Units 08/25/2023    5:01 AM 08/24/2023   12:09 AM 11/29/2021    4:21 PM  CBC  WBC 4.0 - 10.5 K/uL 4.4  8.4  3.7   Hemoglobin 12.0 - 15.0 g/dL 88.3  87.3  87.1   Hematocrit 36.0 - 46.0 % 37.0  38.9  38.8   Platelets 150 - 400 K/uL 109  163  160       Latest Ref Rng & Units 08/25/2023    5:01 AM 08/24/2023   12:09 AM 11/29/2021    4:21 PM  BMP  Glucose 70 - 99 mg/dL 882  873  892   BUN 6 - 20 mg/dL 11  14  6    Creatinine 0.44 - 1.00 mg/dL 9.40  9.38  9.31   Sodium 135 - 145 mmol/L 138  134  135   Potassium  3.5 - 5.1 mmol/L 3.0  3.6  3.7   Chloride 98 - 111 mmol/L 106  103  101   CO2 22 - 32 mmol/L 25  22  26    Calcium 8.9 - 10.3 mg/dL 8.6  8.7  8.6    CT Angio Chest PE W and/or Wo Contrast Result Date: 08/24/2023 CLINICAL DATA:  Treated for severe allergic reaction in the field by M mass, on arrival with shortness of breath and chest tightness. Concern for pulmonary embolism. Also with abdominal pain, acute, nonlocalized. EXAM: CT ANGIOGRAPHY CHEST CT ABDOMEN AND PELVIS WITH CONTRAST TECHNIQUE: Multidetector CT imaging of the chest was performed using the standard protocol during bolus administration of intravenous contrast. Multiplanar CT image reconstructions and MIPs were obtained to evaluate the vascular anatomy. Multidetector CT imaging of the abdomen and pelvis was performed using the standard  protocol during bolus administration of intravenous contrast. RADIATION DOSE REDUCTION: This exam was performed according to the departmental dose-optimization program which includes automated exposure control, adjustment of the mA and/or kV according to patient size and/or use of iterative reconstruction technique. CONTRAST:  OMNIPAQUE  IOHEXOL  350 MG/ML SOLN COMPARISON:  Chest CTs with IV contrast 12/04/2021 and 07/25/2016, CT abdomen pelvis with IV contrast 03/02/2021. FINDINGS: CTA CHEST FINDINGS Cardiovascular: The cardiac size is normal. There is no pericardial effusion. The pulmonary trunk is prominent measuring 3.3 cm indicating arterial hypertension, previously 3.4 cm. No arterial embolism is seen. The aorta and great vessels demonstrate no aneurysm, stenosis or dissection. There are mild calcific plaques in the aortic isthmus. Pulmonary veins are normal in caliber. There are no visible coronary calcifications. Mediastinum/Nodes: The lower poles of thyroid gland, axillary spaces, thoracic trachea, main bronchi, and thoracic esophagus are unremarkable. No intrathoracic adenopathy is seen. Lungs/Pleura: There is post XRT subpleural reticulation anteriorly in the left upper lobe. Central airways clear. Previously there were ground-glass nodules in the left lung which have cleared. There are mild posterior atelectatic changes in both lungs. No consolidation, effusion, nodules or pneumothorax are seen. Musculoskeletal: Status post bilateral mastectomy. No chest wall mass is seen. No regional bone metastasis is seen. Review of the MIP images confirms the above findings. CT ABDOMEN and PELVIS FINDINGS Hepatobiliary: Solitary 1.5 cm stone in the gallbladder. No wall thickening. No bile duct dilatation. No focal liver abnormality is seen. Pancreas: No abnormality. Spleen: The spleen mildly prominent 14.3 cm AP, unchanged.  No mass. Adrenals/Urinary Tract: Adrenal glands are unremarkable. Kidneys are normal,  without renal calculi, focal lesion, or hydronephrosis. Bladder is unremarkable. Stomach/Bowel: No dilatation or wall thickening including the appendix. Vascular/Lymphatic: Aortic atherosclerosis. No enlarged abdominal or pelvic lymph nodes. Reproductive: Uterus and bilateral adnexa are unremarkable. Other: Small umbilical fat hernia. No incarcerated hernia. No free fluid, free air, or focal inflammatory process. Multiple pelvic phleboliths. Musculoskeletal: No regional bone metastasis is seen. Degenerative change noted lumbar spine with mild lumbar levoscoliosis. Early hip DJD. Review of the MIP images confirms the above findings. IMPRESSION: 1. No acute chest CT or CTA findings. 2. Prominent pulmonary trunk indicating arterial hypertension. No arterial embolus is seen. 3. Aortic atherosclerosis. 4. Cholelithiasis without evidence of cholecystitis. 5. Stable mild splenomegaly. 6. Small umbilical fat hernia. Aortic Atherosclerosis (ICD10-I70.0). Electronically Signed   By: Francis Quam M.D.   On: 08/24/2023 06:08   CT ABDOMEN PELVIS W CONTRAST Result Date: 08/24/2023 CLINICAL DATA:  Treated for severe allergic reaction in the field by M mass, on arrival with shortness of breath and chest tightness. Concern for  pulmonary embolism. Also with abdominal pain, acute, nonlocalized. EXAM: CT ANGIOGRAPHY CHEST CT ABDOMEN AND PELVIS WITH CONTRAST TECHNIQUE: Multidetector CT imaging of the chest was performed using the standard protocol during bolus administration of intravenous contrast. Multiplanar CT image reconstructions and MIPs were obtained to evaluate the vascular anatomy. Multidetector CT imaging of the abdomen and pelvis was performed using the standard protocol during bolus administration of intravenous contrast. RADIATION DOSE REDUCTION: This exam was performed according to the departmental dose-optimization program which includes automated exposure control, adjustment of the mA and/or kV according to patient  size and/or use of iterative reconstruction technique. CONTRAST:  OMNIPAQUE  IOHEXOL  350 MG/ML SOLN COMPARISON:  Chest CTs with IV contrast 12/04/2021 and 07/25/2016, CT abdomen pelvis with IV contrast 03/02/2021. FINDINGS: CTA CHEST FINDINGS Cardiovascular: The cardiac size is normal. There is no pericardial effusion. The pulmonary trunk is prominent measuring 3.3 cm indicating arterial hypertension, previously 3.4 cm. No arterial embolism is seen. The aorta and great vessels demonstrate no aneurysm, stenosis or dissection. There are mild calcific plaques in the aortic isthmus. Pulmonary veins are normal in caliber. There are no visible coronary calcifications. Mediastinum/Nodes: The lower poles of thyroid gland, axillary spaces, thoracic trachea, main bronchi, and thoracic esophagus are unremarkable. No intrathoracic adenopathy is seen. Lungs/Pleura: There is post XRT subpleural reticulation anteriorly in the left upper lobe. Central airways clear. Previously there were ground-glass nodules in the left lung which have cleared. There are mild posterior atelectatic changes in both lungs. No consolidation, effusion, nodules or pneumothorax are seen. Musculoskeletal: Status post bilateral mastectomy. No chest wall mass is seen. No regional bone metastasis is seen. Review of the MIP images confirms the above findings. CT ABDOMEN and PELVIS FINDINGS Hepatobiliary: Solitary 1.5 cm stone in the gallbladder. No wall thickening. No bile duct dilatation. No focal liver abnormality is seen. Pancreas: No abnormality. Spleen: The spleen mildly prominent 14.3 cm AP, unchanged.  No mass. Adrenals/Urinary Tract: Adrenal glands are unremarkable. Kidneys are normal, without renal calculi, focal lesion, or hydronephrosis. Bladder is unremarkable. Stomach/Bowel: No dilatation or wall thickening including the appendix. Vascular/Lymphatic: Aortic atherosclerosis. No enlarged abdominal or pelvic lymph nodes. Reproductive: Uterus and  bilateral adnexa are unremarkable. Other: Small umbilical fat hernia. No incarcerated hernia. No free fluid, free air, or focal inflammatory process. Multiple pelvic phleboliths. Musculoskeletal: No regional bone metastasis is seen. Degenerative change noted lumbar spine with mild lumbar levoscoliosis. Early hip DJD. Review of the MIP images confirms the above findings. IMPRESSION: 1. No acute chest CT or CTA findings. 2. Prominent pulmonary trunk indicating arterial hypertension. No arterial embolus is seen. 3. Aortic atherosclerosis. 4. Cholelithiasis without evidence of cholecystitis. 5. Stable mild splenomegaly. 6. Small umbilical fat hernia. Aortic Atherosclerosis (ICD10-I70.0). Electronically Signed   By: Francis Quam M.D.   On: 08/24/2023 06:08   DG Chest Port 1 View Result Date: 08/24/2023 CLINICAL DATA:  Questionable sepsis-evaluate for abnormality EXAM: PORTABLE CHEST 1 VIEW COMPARISON:  12/01/2021 FINDINGS: The heart size and mediastinal contours are within normal limits. Both lungs are clear. The visualized skeletal structures are unremarkable. IMPRESSION: No active disease. Electronically Signed   By: Norman Gatlin M.D.   On: 08/24/2023 01:24    Family Communication: Discussed with patient, understand and agree. All questions answered.  Disposition: Status is: Inpatient Remains inpatient appropriate because: sepsis work up  Planned Discharge Destination: Home     Time spent: 46 minutes  Author: Concepcion Riser, MD 08/25/2023 5:37 PM Secure chat 7am to 7pm For on call  review [email or web address].

## 2023-08-25 NOTE — Consult Note (Incomplete)
 [facility]                                                                                        Patient Identification: Patient Name: [name] MRN: [id number] Admit Date: 08/23/2023 10:57 PM Today's Date: 08/25/2023 Reason for consult: fevers, rash Requesting provider: Dr Concepcion   Principal Problem:   Septic shock (HCC)   Antibiotics:  Vancomycin  8/18- Cefepime  8/18- Metronidazole  8/18-  Lines/Hardware:  Assessment # Acute onset rash # Fevers # Lactic acidosis - differentials:  unknown allergic reaction, does not seem to be consistent with bacterial toxic shock syndrome ( Cr normal, Liver enzymes and coags not done, mental status intact, did not require vasopressors, no evidence of multi system involvement) - Prior rash has already resolved - Doubt cellulitis or necrotizing SSTI - Lactic acid is improving   Recommendations  - fu blood cx - will switch antibiotics to IV cefazolin  only for now  - Universal/standard isolation precautions  - Following  Rest of the management as per the primary team. Please call with questions or concerns.  Thank you for the consult  __________________________________________________________________________________________________________ HPI and Hospital Course: 53 Y O Female with prior h/o breast ca s/p mastectomy, chemoradiation in remission, total hysterectomy with b/l salphingo-oophorectomy who presented to ED on 8/19 due to concerns for acute allergic reaction.   She was at her usual state of health and was at work Monday [employer]) and was going home when she left some left sided headache and fatigue. This was followed by sudden onset red warm confluent areas with itching in her upper abdomen. She went to [facility] to get Benadryl  and took doses and felt getting worse with severe nausea, dizziness, chest tightness, SOB and  anxiousness. EMS called, and gave epi pen en route.  At ED, developed fever, tachycardic, tachypneic, diffuse shaking  Labs remarkable for Lipase 57, CBC and CMP unremarkable Influenza A/B, RSV and SARScov2 HIV NR UA with some leukocytes, rare bacteria, RBC 0-5, WBC 6-10, unremarkable for UTI  Was given IV benadryl , IV famotidine , IV soulemdrol, IVF, IV zofran , IV Vancomycin , cefepime  and metronidazole  Rash almost resolved during her ED stay   CXR, CTAPE and CT abdomen/pelvis with unremarkable findings.  Denies smoking, alcohol and recreational drugs. No new lotion, cream, medication or unusual food intake. No known injury, insect or animal bite. Has a dig at home for many years. Reports being on Ozempic and loss of over 100 lbs over a year. Her rash in the abdomen has resolved but she feels rt side of abdomen with some swelling and mild tenderness.   ROS: General- Denies fever, chills, loss of appetite and loss of weight HEENT - Denies headache, blurry vision, neck pain, sinus pain Chest - Denies any chest pain, SOB or cough CVS- Denies any dizziness/lightheadedness, syncopal attacks, palpitations Abdomen- Denies any nausea, vomiting, abdominal pain, hematochezia and diarrhea Neuro - Denies any weakness, numbness, tingling sensation Psych - Denies any changes in mood irritability or depressive symptoms GU- Denies any burning, dysuria, hematuria or increased frequency of urination Skin - rashes resolved  MSK - denies any joint  pain/swelling or restricted ROM   Past Medical History:  Diagnosis Date   Arthritis    Breast cancer (HCC) 11/24/2019   left    Complication of anesthesia    DAY SURGERY 2010 OR 2011 ASPIRATED AND STAYED OVERNIGHT   History of radiation therapy 03/11/17- 04/21/17   Left breast, 2 Gy in 25 fractions for a total dose of 50 Gy, Boost, 2 Gy in 5 fractions for a total dose of 10 Gy   Hypertension    OFF MEDS  SINCE CHEMO X 5 MONTHS   Malignant neoplasm of  upper-outer quadrant of left breast in female, estrogen receptor negative (HCC) 06/23/2016   Past Surgical History:  Procedure Laterality Date   ANKLE SURGERY Right    BREAST CYST EXCISION Right 07/15/2020   Procedure: Excision excess right axillary tissue;  Surgeon: Elisabeth Craig RAMAN, MD;  Location: [facility] OR;  Service: Plastics;  Laterality: Right;   BREAST LUMPECTOMY Left 01/27/2017   BREAST LUMPECTOMY WITH RADIOACTIVE SEED AND SENTINEL LYMPH NODE BIOPSY Left 01/27/2017   Procedure: LEFT BREAST LUMPECTOMY WITH RADIOACTIVE SEED LOCALIZATION, LEFT AXILLARY DEEP LYMPH NODE BIOPSY WITH RADIOACTIVE SEED LOCALIZATION, LEFT AXILLARY SENTINEL LYMPH NODE MAPPING AND BIOPSY WITH BLUE DYE INJECTION;  Surgeon: Gail Favorite, MD;  Location: [facility] OR;  Service: General;  Laterality: Left;   COLONOSCOPY WITH PROPOFOL  N/A 02/01/2020   Procedure: COLONOSCOPY WITH PROPOFOL ;  Surgeon: Teressa Toribio SQUIBB, MD;  Location: [facility];  Service: Endoscopy;  Laterality: N/A;   DEBRIDEMENT AND CLOSURE WOUND Left 03/28/2020   Procedure: DEBRIDEMENT AND CLOSURE OF MASTECTOMY WOUND;  Surgeon: Ebbie Cough, MD;  Location: [facility];  Service: General;  Laterality: Left;   DEBRIDEMENT AND CLOSURE WOUND Left 07/15/2020   Procedure: Debridement of left chest wound and rotational flap closure;  Surgeon: Elisabeth Craig RAMAN, MD;  [facility];  Service: Plastics;  Laterality: Left;  1.5 hour   ENDOSCOPIC MUCOSAL RESECTION  02/01/2020   Procedure: ENDOSCOPIC MUCOSAL RESECTION;  Surgeon: Teressa Toribio SQUIBB, MD;  Location: [facility];  Service: Endoscopy;;   HEMOSTASIS CLIP PLACEMENT  02/01/2020   Procedure: HEMOSTASIS CLIP PLACEMENT;  Surgeon: Teressa Toribio SQUIBB, MD;  [facility]: [facility];  Service: Endoscopy;;   INCISIONAL HERNIA REPAIR N/A 02/26/2021   Procedure: LAPAROSCOPIC INCISIONAL HERNIA WITH MESH;  Surgeon: Ebbie Cough, MD;  Location: [facility];  Service: General;  Laterality: N/A;   IR FLUORO GUIDE PORT INSERTION RIGHT   07/14/2016   IR US  GUIDE VASC ACCESS RIGHT  07/14/2016   MASTECTOMY W/ SENTINEL NODE BIOPSY Bilateral 01/09/2020   Procedure: BILATERAL MASTECTOMY WITH LEFT AXILLARY SENTINEL LYMPH NODE BIOPSY;  Surgeon: Ebbie Cough, MD;  Location: [facility];  Service: General;  Laterality: Bilateral;   PORT-A-CATH REMOVAL Right 01/27/2017   Procedure: REMOVAL PORT-A-CATH;  Surgeon: Gail Favorite, MD;  Location: [facility] OR;  Service: General;  Laterality: Right;   PORT-A-CATH REMOVAL Right 02/26/2021   Procedure: REMOVAL PORT-A-CATH;  Surgeon: Ebbie Cough, MD;  Location: [facility];  Service: General;  Laterality: Right;   PORTACATH PLACEMENT Right 01/09/2020   Procedure: INSERTION PORT-A-CATH;  Surgeon: Ebbie Cough, MD;  Location: [facility];  Service: General;  Laterality: Right;   ROBOTIC ASSISTED TOTAL HYSTERECTOMY WITH BILATERAL SALPINGO OOPHERECTOMY N/A 02/26/2021   Procedure: XI ROBOTIC ASSISTED BILATERAL SALPINGO OOPHORECTOMY;  Surgeon: Viktoria Comer SAUNDERS, MD;  Location: [facility];  Service: Gynecology;  Laterality: N/A;   SUBMUCOSAL LIFTING INJECTION  02/01/2020   Procedure: SUBMUCOSAL LIFTING INJECTION;  Surgeon: Teressa Toribio  P, MD;  Location: [facility];  Service: Endoscopy;;   TUBAL LIGATION     WISDOM TOOTH EXTRACTION     WOUND EXPLORATION N/A 10/01/2022   Procedure: ABDOMINAL EXPLORATION;  Surgeon: Lorretta Dess, MD;  Location: [facility];  Service: Plastics;  Laterality: N/A;   Scheduled Meds:  docusate sodium   200 mg Oral QHS   enoxaparin  (LOVENOX ) injection  40 mg Subcutaneous Q24H   gabapentin   200 mg Oral QHS   Continuous Infusions:  ceFEPime  (MAXIPIME ) IV 2 g (08/25/23 0757)   metronidazole  500 mg (08/25/23 0510)   vancomycin  1,000 mg (08/25/23 0900)   PRN Meds:.acetaminophen  **OR** acetaminophen , albuterol , EPINEPHrine , ondansetron  **OR** ondansetron  (ZOFRAN ) IV, mouth rinse, oxyCODONE , traZODone   Allergies  Allergen Reactions   Carboplatin   Other (See Comments)    Red, blotchy, hot   Social History   Socioeconomic History   Marital status: Married    Spouse name: Not on file   Number of children: Not on file   Years of education: Not on file   Highest education level: Not on file  Occupational History   Not on file  Tobacco Use   Smoking status: Never   Smokeless tobacco: Never  Vaping Use   Vaping status: Never Used  Substance and Sexual Activity   Alcohol use: Yes    Comment: rare   Drug use: No   Sexual activity: Not Currently  Other Topics Concern   Not on file  Social History Narrative   Not on file   Social Drivers of Health   Financial Resource Strain: Not on file  Food Insecurity: No Food Insecurity (08/24/2023)   Hunger Vital Sign    Worried About Running Out of Food in the Last Year: Never true    Ran Out of Food in the Last Year: Never true  Transportation Needs: No Transportation Needs (08/24/2023)   PRAPARE - Administrator, Civil Service (Medical): No    Lack of Transportation (Non-Medical): No  Physical Activity: Not on file  Stress: Not on file  Social Connections: Not on file  Intimate Partner Violence: Not At Risk (08/24/2023)   Humiliation, Afraid, Rape, and Kick questionnaire    Fear of Current or Ex-Partner: No    Emotionally Abused: No    Physically Abused: No    Sexually Abused: No    Family History  Problem Relation Age of Onset   Diabetes Mother    Hypertension Mother    Arthritis Mother    Colon cancer Father 49       died .76 metastatic at time of diagnosis   Diabetes Father    Heart disease Father    Hypertension Father    Breast cancer Maternal Grandmother 56       d.60s   Breast cancer Maternal Aunt 52   Cervical cancer Maternal Aunt 55   Breast cancer Maternal Aunt 46       d.50s   Cancer Maternal Uncle        d.62s unspecified type of cancer   Breast cancer Cousin 40       paternal first-cousin (daughter of unaffected aunt)   Cancer Maternal  Aunt 47       Female Cancer   Cancer Maternal Aunt        unknown cancer   Brain cancer Daughter 40   Esophageal cancer Other    Colon polyps Neg Hx    Rectal cancer Neg Hx    Stomach cancer Neg Hx  Vitals BP 115/62 (BP Location: Right Wrist)   Pulse 76   Temp 97.8 F (36.6 C) (Oral)   Resp 16   Ht 5' 7 (1.702 m)   Wt 78.5 kg   LMP 06/25/2016   SpO2 98%   BMI 27.10 kg/m    Physical Exam Constitutional:  adult female lying in the bed, comfortable    Comments: HEENT, no facial or lip swelling   Cardiovascular:     Rate and Rhythm: Normal rate and regular rhythm.     Heart sounds: s1s2  Pulmonary:     Effort: Pulmonary effort is normal.     Comments: Normal breath sounds   Abdominal:     Palpations: Abdomen is soft.     Tenderness: non distended, rt side of abdomen with mild swelling, ? Tenderness, no fluctuance or crepitus or erythema or warmth. No signs of nec fasc  Musculoskeletal:        General: No swelling or tenderness in peripheral joints   Skin:    Comments: no active rash  Neurological:     General: awake, alert and oriented, non focal   Psychiatric:        Mood and Affect: Mood normal.    Pertinent Microbiology Results for orders placed or performed during the hospital encounter of 08/23/23  Resp panel by RT-PCR (RSV, Flu A&B, Covid) Anterior Nasal Swab     Status: None   Collection Time: 08/24/23  1:25 AM   Specimen: Anterior Nasal Swab  Result Value Ref Range Status   SARS Coronavirus 2 by RT PCR NEGATIVE NEGATIVE Final    Comment: (NOTE) SARS-CoV-2 target nucleic acids are NOT DETECTED.  The SARS-CoV-2 RNA is generally detectable in upper respiratory specimens during the acute phase of infection. The lowest concentration of SARS-CoV-2 viral copies this assay can detect is 138 copies/mL. A negative result does not preclude SARS-Cov-2 infection and should not be used as the sole basis for treatment or other patient management  decisions. A negative result may occur with  improper specimen collection/handling, submission of specimen other than nasopharyngeal swab, presence of viral mutation(s) within the areas targeted by this assay, and inadequate number of viral copies(<138 copies/mL). A negative result must be combined with clinical observations, patient history, and epidemiological information. The expected result is Negative.  Fact Sheet for Patients:  BloggerCourse.com  Fact Sheet for Healthcare Providers:  SeriousBroker.it  This test is no t yet approved or cleared by the [place]  FDA and  has been authorized for detection and/or diagnosis of SARS-CoV-2 by FDA under an Emergency Use Authorization (EUA). This EUA will remain  in effect (meaning this test can be used) for the duration of the COVID-19 declaration under Section 564(b)(1) of the Act, 21 U.S.C.section 360bbb-3(b)(1), unless the authorization is terminated  or revoked sooner.       Influenza A by PCR NEGATIVE NEGATIVE Final   Influenza B by PCR NEGATIVE NEGATIVE Final    Comment: (NOTE) The Xpert Xpress SARS-CoV-2/FLU/RSV plus assay is intended as an aid in the diagnosis of influenza from Nasopharyngeal swab specimens and should not be used as a sole basis for treatment. Nasal washings and aspirates are unacceptable for Xpert Xpress SARS-CoV-2/FLU/RSV testing.  Fact Sheet for Patients: BloggerCourse.com  Fact Sheet for Healthcare Providers: SeriousBroker.it  This test is not yet approved or cleared by the [place]  FDA and has been authorized for detection and/or diagnosis of SARS-CoV-2 by FDA under an Emergency Use Authorization (EUA). This EUA will  remain in effect (meaning this test can be used) for the duration of the COVID-19 declaration under Section 564(b)(1) of the Act, 21 U.S.C. section 360bbb-3(b)(1), unless the  authorization is terminated or revoked.     Resp Syncytial Virus by PCR NEGATIVE NEGATIVE Final    Comment: (NOTE) Fact Sheet for Patients: BloggerCourse.com  Fact Sheet for Healthcare Providers: SeriousBroker.it  This test is not yet approved or cleared by the [place]  FDA and has been authorized for detection and/or diagnosis of SARS-CoV-2 by FDA under an Emergency Use Authorization (EUA). This EUA will remain in effect (meaning this test can be used) for the duration of the COVID-19 declaration under Section 564(b)(1) of the Act, 21 U.S.C. section 360bbb-3(b)(1), unless the authorization is terminated or revoked.  Performed at [facility], [address] [place], [place] [address]   Blood Culture (routine x 2)     Status: None (Preliminary result)   Collection Time: 08/24/23  2:31 AM   Specimen: BLOOD  Result Value Ref Range Status   Specimen Description   Final    BLOOD BLOOD RIGHT HAND Performed at [facility], [address], [place], [place] [address]    Special Requests   Final    BOTTLES DRAWN AEROBIC AND ANAEROBIC Blood Culture adequate volume Performed at [facility], [address] [place], [place] [address]    Culture   Final    NO GROWTH 1 DAY Performed at [facility], [address] [place], [place] [address]    Report Status PENDING  Incomplete  Blood Culture (routine x 2)     Status: None (Preliminary result)   Collection Time: 08/24/23  2:31 AM   Specimen: BLOOD  Result Value Ref Range Status   Specimen Description   Final    BLOOD BLOOD RIGHT ARM Performed at [facility], [address] [place], [place] [address]    Special Requests   Final    BOTTLES DRAWN AEROBIC AND ANAEROBIC Blood Culture adequate volume Performed at [facility], [address], [place], [place] [address]     Culture   Final    NO GROWTH 1 DAY Performed at [facility], [address] [place], [place] [address]    Report Status PENDING  Incomplete   *Note: Due to a large number of results and/or encounters for the requested time period, some results have not been displayed. A complete set of results can be found in Results Review.   Pertinent Lab seen by me:    Latest Ref Rng & Units 08/26/2023    5:21 AM 08/25/2023    5:01 AM 08/24/2023   12:09 AM  CBC  WBC 4.0 - 10.5 K/uL 4.2  4.4  8.4   Hemoglobin 12.0 - 15.0 g/dL 88.6  88.3  87.3   Hematocrit 36.0 - 46.0 % 36.9  37.0  38.9   Platelets 150 - 400 K/uL 120  109  163       Latest Ref Rng & Units 08/26/2023    5:21 AM 08/25/2023    5:01 AM 08/24/2023    1:00 AM  CMP  Glucose 70 - 99 mg/dL 888  882    BUN 6 - 20 mg/dL 10  11    Creatinine 9.55 - 1.00 mg/dL 9.41  9.40    Sodium 864 - 145 mmol/L 143  138    Potassium 3.5 - 5.1 mmol/L 3.6  3.0  Chloride 98 - 111 mmol/L 110  106    CO2 22 - 32 mmol/L 25  25    Calcium 8.9 - 10.3 mg/dL 8.4  8.6    Total Protein 6.5 - 8.1 g/dL   7.0   Total Bilirubin 0.0 - 1.2 mg/dL   1.0   Alkaline Phos 38 - 126 U/L   64   AST 15 - 41 U/L   23   ALT 0 - 44 U/L   15     Pertinent Imagings/Other Imagings Plain films and CT images have been personally visualized and interpreted; radiology reports have been reviewed. Decision making incorporated into the Impression / Recommendations.  CT Angio Chest PE W and/or Wo Contrast Result Date: 08/24/2023 CLINICAL DATA:  Treated for severe allergic reaction in the field by M mass, on arrival with shortness of breath and chest tightness. Concern for pulmonary embolism. Also with abdominal pain, acute, nonlocalized. EXAM: CT ANGIOGRAPHY CHEST CT ABDOMEN AND PELVIS WITH CONTRAST TECHNIQUE: Multidetector CT imaging of the chest was performed using the standard protocol during bolus administration of intravenous contrast. Multiplanar CT image reconstructions and  MIPs were obtained to evaluate the vascular anatomy. Multidetector CT imaging of the abdomen and pelvis was performed using the standard protocol during bolus administration of intravenous contrast. RADIATION DOSE REDUCTION: This exam was performed according to the departmental dose-optimization program which includes automated exposure control, adjustment of the mA and/or kV according to patient size and/or use of iterative reconstruction technique. CONTRAST:  OMNIPAQUE  IOHEXOL  350 MG/ML SOLN COMPARISON:  Chest CTs with IV contrast 12/04/2021 and 07/25/2016, CT abdomen pelvis with IV contrast 03/02/2021. FINDINGS: CTA CHEST FINDINGS Cardiovascular: The cardiac size is normal. There is no pericardial effusion. The pulmonary trunk is prominent measuring 3.3 cm indicating arterial hypertension, previously 3.4 cm. No arterial embolism is seen. The aorta and great vessels demonstrate no aneurysm, stenosis or dissection. There are mild calcific plaques in the aortic isthmus. Pulmonary veins are normal in caliber. There are no visible coronary calcifications. Mediastinum/Nodes: The lower poles of thyroid gland, axillary spaces, thoracic trachea, main bronchi, and thoracic esophagus are unremarkable. No intrathoracic adenopathy is seen. Lungs/Pleura: There is post XRT subpleural reticulation anteriorly in the left upper lobe. Central airways clear. Previously there were ground-glass nodules in the left lung which have cleared. There are mild posterior atelectatic changes in both lungs. No consolidation, effusion, nodules or pneumothorax are seen. Musculoskeletal: Status post bilateral mastectomy. No chest wall mass is seen. No regional bone metastasis is seen. Review of the MIP images confirms the above findings. CT ABDOMEN and PELVIS FINDINGS Hepatobiliary: Solitary 1.5 cm stone in the gallbladder. No wall thickening. No bile duct dilatation. No focal liver abnormality is seen. Pancreas: No abnormality. Spleen: The  spleen mildly prominent 14.3 cm AP, unchanged.  No mass. Adrenals/Urinary Tract: Adrenal glands are unremarkable. Kidneys are normal, without renal calculi, focal lesion, or hydronephrosis. Bladder is unremarkable. Stomach/Bowel: No dilatation or wall thickening including the appendix. Vascular/Lymphatic: Aortic atherosclerosis. No enlarged abdominal or pelvic lymph nodes. Reproductive: Uterus and bilateral adnexa are unremarkable. Other: Small umbilical fat hernia. No incarcerated hernia. No free fluid, free air, or focal inflammatory process. Multiple pelvic phleboliths. Musculoskeletal: No regional bone metastasis is seen. Degenerative change noted lumbar spine with mild lumbar levoscoliosis. Early hip DJD. Review of the MIP images confirms the above findings. IMPRESSION: 1. No acute chest CT or CTA findings. 2. Prominent pulmonary trunk indicating arterial hypertension. No arterial embolus is seen. 3. Aortic  atherosclerosis. 4. Cholelithiasis without evidence of cholecystitis. 5. Stable mild splenomegaly. 6. Small umbilical fat hernia. Aortic Atherosclerosis (ICD10-I70.0). Electronically Signed   By: Francis Quam M.D.   On: 08/24/2023 06:08   CT ABDOMEN PELVIS W CONTRAST Result Date: 08/24/2023 CLINICAL DATA:  Treated for severe allergic reaction in the field by M mass, on arrival with shortness of breath and chest tightness. Concern for pulmonary embolism. Also with abdominal pain, acute, nonlocalized. EXAM: CT ANGIOGRAPHY CHEST CT ABDOMEN AND PELVIS WITH CONTRAST TECHNIQUE: Multidetector CT imaging of the chest was performed using the standard protocol during bolus administration of intravenous contrast. Multiplanar CT image reconstructions and MIPs were obtained to evaluate the vascular anatomy. Multidetector CT imaging of the abdomen and pelvis was performed using the standard protocol during bolus administration of intravenous contrast. RADIATION DOSE REDUCTION: This exam was performed according to the  departmental dose-optimization program which includes automated exposure control, adjustment of the mA and/or kV according to patient size and/or use of iterative reconstruction technique. CONTRAST:  OMNIPAQUE  IOHEXOL  350 MG/ML SOLN COMPARISON:  Chest CTs with IV contrast 12/04/2021 and 07/25/2016, CT abdomen pelvis with IV contrast 03/02/2021. FINDINGS: CTA CHEST FINDINGS Cardiovascular: The cardiac size is normal. There is no pericardial effusion. The pulmonary trunk is prominent measuring 3.3 cm indicating arterial hypertension, previously 3.4 cm. No arterial embolism is seen. The aorta and great vessels demonstrate no aneurysm, stenosis or dissection. There are mild calcific plaques in the aortic isthmus. Pulmonary veins are normal in caliber. There are no visible coronary calcifications. Mediastinum/Nodes: The lower poles of thyroid gland, axillary spaces, thoracic trachea, main bronchi, and thoracic esophagus are unremarkable. No intrathoracic adenopathy is seen. Lungs/Pleura: There is post XRT subpleural reticulation anteriorly in the left upper lobe. Central airways clear. Previously there were ground-glass nodules in the left lung which have cleared. There are mild posterior atelectatic changes in both lungs. No consolidation, effusion, nodules or pneumothorax are seen. Musculoskeletal: Status post bilateral mastectomy. No chest wall mass is seen. No regional bone metastasis is seen. Review of the MIP images confirms the above findings. CT ABDOMEN and PELVIS FINDINGS Hepatobiliary: Solitary 1.5 cm stone in the gallbladder. No wall thickening. No bile duct dilatation. No focal liver abnormality is seen. Pancreas: No abnormality. Spleen: The spleen mildly prominent 14.3 cm AP, unchanged.  No mass. Adrenals/Urinary Tract: Adrenal glands are unremarkable. Kidneys are normal, without renal calculi, focal lesion, or hydronephrosis. Bladder is unremarkable. Stomach/Bowel: No dilatation or wall thickening  including the appendix. Vascular/Lymphatic: Aortic atherosclerosis. No enlarged abdominal or pelvic lymph nodes. Reproductive: Uterus and bilateral adnexa are unremarkable. Other: Small umbilical fat hernia. No incarcerated hernia. No free fluid, free air, or focal inflammatory process. Multiple pelvic phleboliths. Musculoskeletal: No regional bone metastasis is seen. Degenerative change noted lumbar spine with mild lumbar levoscoliosis. Early hip DJD. Review of the MIP images confirms the above findings. IMPRESSION: 1. No acute chest CT or CTA findings. 2. Prominent pulmonary trunk indicating arterial hypertension. No arterial embolus is seen. 3. Aortic atherosclerosis. 4. Cholelithiasis without evidence of cholecystitis. 5. Stable mild splenomegaly. 6. Small umbilical fat hernia. Aortic Atherosclerosis (ICD10-I70.0). Electronically Signed   By: Francis Quam M.D.   On: 08/24/2023 06:08   DG Chest Port 1 View Result Date: 08/24/2023 CLINICAL DATA:  Questionable sepsis-evaluate for abnormality EXAM: PORTABLE CHEST 1 VIEW COMPARISON:  12/01/2021 FINDINGS: The heart size and mediastinal contours are within normal limits. Both lungs are clear. The visualized skeletal structures are unremarkable. IMPRESSION: No active disease. Electronically  Signed   By: Norman Gatlin M.D.   On: 08/24/2023 01:24   I spent 80 minutes involved in face-to-face and non-face-to-face activities for this patient on the day of the visit. Professional time spent includes the following activities: Preparing to see the patient (review of tests), Obtaining and reviewing separately obtained history (ED note, H and P note, hospitalist progress note), Performing a medically appropriate examination and evaluation , Ordering medications/labs, referring and communicating with other health care professionals, Documenting clinical information in the EMR, Independently interpreting results (not separately reported), Communicating results to the  patient, Counseling and educating the patient/family and Care coordination (not separately reported).  Electronically signed by:   Plan d/w requesting provider as well as ID pharm D  Of note, portions of this note may have been created with voice recognition software. While this note has been edited for accuracy, occasional wrong-word or 'sound-a-like' substitutions may have occurred due to the inherent limitations of voice recognition software.   Annalee Orem, MD Infectious Disease Physician [facility] Pager: [phone]

## 2023-08-25 NOTE — TOC Initial Note (Signed)
 Transition of Care [facility]) - Initial/Assessment Note    Patient Details  Name: [name] MRN: [id number] Date of Birth: 1970-12-15  [facility] CM/SW Contact:    Bascom Service, RN Phone Number: 08/25/2023, 12:24 PM  Clinical Narrative: d/c plan home.                  Expected Discharge Plan: Home/Self Care Barriers to Discharge: Continued Medical Work up   Patient Goals and CMS Choice            Expected Discharge Plan and Services                                              Prior Living Arrangements/Services                       Activities of Daily Living   ADL Screening (condition at time of admission) Independently performs ADLs?: Yes (appropriate for developmental age) Is the patient deaf or have difficulty hearing?: No Does the patient have difficulty seeing, even when wearing glasses/contacts?: No Does the patient have difficulty concentrating, remembering, or making decisions?: No  Permission Sought/Granted                  Emotional Assessment              Admission diagnosis:  Rash [R21] Fever in adult [R50.9] Septic shock (HCC) [A41.9, R65.21] Patient Active Problem List   Diagnosis Date Noted   Septic shock (HCC) 08/24/2023   Incisional hernia 02/26/2021   S/P bilateral mastectomy 01/09/2020   Recurrent breast cancer, left (HCC) 12/16/2019   Umbilical hernia without obstruction and without gangrene 11/07/2019   BRCA2 gene mutation positive in female 10/16/2019   Port-A-Cath in place 11/02/2016   Cellulitis 10/24/2016   Family history of breast cancer 10/08/2016   Genetic testing 09/21/2016   Sepsis (HCC) 08/27/2016   Rash 08/27/2016   Hypokalemia 08/27/2016   Neutropenic fever (HCC) 08/26/2016   Constipation 07/27/2016   Malignant neoplasm of upper-outer quadrant of left breast in female, estrogen receptor negative (HCC) 06/23/2016   Left breast mass 06/12/2016   Essential hypertension  06/12/2016   Class 3 severe obesity due to excess calories without serious comorbidity with body mass index (BMI) of 40.0 to 44.9 in adult 06/12/2016   Low vitamin D  level 06/12/2016   Irregular menses 06/12/2016   PCP:  Vicci Barnie NOVAK, MD Pharmacy:   [facility] - [facility] [address] [place] [address] Phone: [phone] Fax: [phone]  [id number] - [address], [place] - [address] AT [facility] OF [address] & [address] [place] [address] Phone: [phone] Fax: [phone]  [facility] [id number] - [place], [place] - [address] AT [facility] OF [address] [place] [address] Phone: [phone] Fax: [phone]     Social Drivers of Health (SDOH) Social History: SDOH Screenings   Food Insecurity: No Food Insecurity (08/24/2023)  Housing: Low Risk  (08/24/2023)  Transportation Needs: No Transportation Needs (08/24/2023)  Utilities: Not At Risk (08/24/2023)  Tobacco Use: Low Risk  (08/24/2023)   SDOH Interventions:     Readmission Risk Interventions     No data to display

## 2023-08-26 DIAGNOSIS — E876 Hypokalemia: Secondary | ICD-10-CM | POA: Diagnosis not present

## 2023-08-26 DIAGNOSIS — C50919 Malignant neoplasm of unspecified site of unspecified female breast: Secondary | ICD-10-CM | POA: Diagnosis not present

## 2023-08-26 DIAGNOSIS — A419 Sepsis, unspecified organism: Secondary | ICD-10-CM | POA: Diagnosis not present

## 2023-08-26 DIAGNOSIS — E8721 Acute metabolic acidosis: Secondary | ICD-10-CM

## 2023-08-26 DIAGNOSIS — R21 Rash and other nonspecific skin eruption: Secondary | ICD-10-CM | POA: Diagnosis not present

## 2023-08-26 DIAGNOSIS — R509 Fever, unspecified: Secondary | ICD-10-CM

## 2023-08-26 LAB — CBC
HCT: 36.9 % (ref 36.0–46.0)
Hemoglobin: 11.3 g/dL — ABNORMAL LOW (ref 12.0–15.0)
MCH: 28.1 pg (ref 26.0–34.0)
MCHC: 30.6 g/dL (ref 30.0–36.0)
MCV: 91.8 fL (ref 80.0–100.0)
Platelets: 120 K/uL — ABNORMAL LOW (ref 150–400)
RBC: 4.02 MIL/uL (ref 3.87–5.11)
RDW: 13.5 % (ref 11.5–15.5)
WBC: 4.2 K/uL (ref 4.0–10.5)
nRBC: 0 % (ref 0.0–0.2)

## 2023-08-26 LAB — BASIC METABOLIC PANEL WITH GFR
Anion gap: 8 (ref 5–15)
BUN: 10 mg/dL (ref 6–20)
CO2: 25 mmol/L (ref 22–32)
Calcium: 8.4 mg/dL — ABNORMAL LOW (ref 8.9–10.3)
Chloride: 110 mmol/L (ref 98–111)
Creatinine, Ser: 0.58 mg/dL (ref 0.44–1.00)
GFR, Estimated: >60 mL/min (ref >60)
Glucose, Bld: 111 mg/dL — ABNORMAL HIGH (ref 70–99)
Potassium: 3.6 mmol/L (ref 3.5–5.1)
Sodium: 143 mmol/L (ref 135–145)

## 2023-08-26 LAB — LACTIC ACID, PLASMA
Lactic Acid, Venous: 2.2 mmol/L (ref 0.5–1.9)
Lactic Acid, Venous: 0.7 mmol/L (ref 0.5–1.9)

## 2023-08-26 MED ORDER — CEFAZOLIN SODIUM-DEXTROSE 2-4 GM/100ML-% IV SOLN
2.0000 g | Freq: Three times a day (TID) | INTRAVENOUS | Status: DC
  Administered 2023-08-26 – 2023-08-27 (×3): 2 g via INTRAVENOUS
  Filled 2023-08-26 (×3): qty 100

## 2023-08-26 NOTE — Progress Notes (Signed)
 Progress Note   Patient: [name] FMW:[id number] DOB: April 28, 1970 DOA: 08/23/2023     2 DOS: the patient was seen and examined on 08/26/2023   Brief hospital course: [name] is a 53 y.o. female with medical history significant for breast cancer status postmastectomy and chemoradiation presented with sudden onset of large area, maculopapular rash on her abdomen  initially treated for allergic reaction noted to have fever, elevated lactic acid, tachycardic got empiric broad spectrum antibiotics admitted to TRH service for septic shock of unclear etiology.  Assessment and Plan: Septic shock-with fever, tachycardia, initial lactate greater than 15.  Differential includes allergic reaction versus sudden acute bacterial infection.   S/p IV fluids per sepsis protocol. Lactic acid improved. WBC normal. Sepsis resolved.  Workup is unrevealing for possible source. ID consult appreciated doubt cellulitis or necrotizing SSTI. Empiric IV vancomycin , IV cefepime , IV Flagyl  changed to IV Ancef . Follow blood cultures. Continue to monitor vitals closely.   Hypokalemia- Continue oral potassium supplements. Follow electrolytes.   Neuropathy-continue home dose gabapentin .  H/o breast cancer s/p chemo, radiation- Outpatient oncology follow up.    Out of bed to chair. Incentive spirometry. Nursing supportive care. Fall, aspiration precautions. Diet:  Diet Orders (From admission, onward)     Start     Ordered   08/24/23 0801  Diet regular Room service appropriate? Yes; Fluid consistency: Thin  Diet effective now       Question Answer Comment  Room service appropriate? Yes   Fluid consistency: Thin      08/24/23 0800           DVT prophylaxis: enoxaparin  (LOVENOX ) injection 40 mg Start: 08/24/23 1000  Level of care: Progressive   Code Status: Full Code  Subjective: Patient is seen and examined today morning. Rash seems better. Has mild headache. Eating poor. Has  constipation.  Physical Exam: Vitals:   08/25/23 1334 08/25/23 2051 08/26/23 0449 08/26/23 1357  BP: (!) 118/58 110/71 115/62 132/82  Pulse: 86 77 76 82  Resp: 16 18 16 18   Temp: 98.3 F (36.8 C) 98.7 F (37.1 C) 97.8 F (36.6 C) 98.2 F (36.8 C)  TempSrc: Oral  Oral Oral  SpO2: 97% 99% 98% 98%  Weight:      Height:        General -  Middle aged Caucasian female, no apparent distress HEENT - PERRLA, EOMI, atraumatic head, non tender sinuses. Lung - Clear, diffuse rales, no rhonchi, wheezes. Heart - S1, S2 heard, no murmurs, rubs, trace pedal edema. Abdomen - Soft, non tender, ruddy rash over abdomen better, bowel sounds good Neuro - Alert, awake and oriented x 3, non focal exam. Skin - Warm and dry.  Data Reviewed:      Latest Ref Rng & Units 08/26/2023    5:21 AM 08/25/2023    5:01 AM 08/24/2023   12:09 AM  CBC  WBC 4.0 - 10.5 K/uL 4.2  4.4  8.4   Hemoglobin 12.0 - 15.0 g/dL 88.6  88.3  87.3   Hematocrit 36.0 - 46.0 % 36.9  37.0  38.9   Platelets 150 - 400 K/uL 120  109  163       Latest Ref Rng & Units 08/26/2023    5:21 AM 08/25/2023    5:01 AM 08/24/2023   12:09 AM  BMP  Glucose 70 - 99 mg/dL 888  882  873   BUN 6 - 20 mg/dL 10  11  14    Creatinine 0.44 -  1.00 mg/dL 9.41  9.40  9.38   Sodium 135 - 145 mmol/L 143  138  134   Potassium 3.5 - 5.1 mmol/L 3.6  3.0  3.6   Chloride 98 - 111 mmol/L 110  106  103   CO2 22 - 32 mmol/L 25  25  22    Calcium 8.9 - 10.3 mg/dL 8.4  8.6  8.7    No results found.   Family Communication: Discussed with patient, understand and agree. All questions answered.  Disposition: Status is: Inpatient Remains inpatient appropriate because: fever work up  Planned Discharge Destination: Home     Time spent: 42 minutes  Author: Concepcion Riser, MD 08/26/2023 3:09 PM Secure chat 7am to 7pm For on call review [email or web address].

## 2023-08-27 ENCOUNTER — Other Ambulatory Visit (HOSPITAL_COMMUNITY): Payer: Self-pay

## 2023-08-27 DIAGNOSIS — C50912 Malignant neoplasm of unspecified site of left female breast: Secondary | ICD-10-CM | POA: Diagnosis not present

## 2023-08-27 DIAGNOSIS — T782XXA Anaphylactic shock, unspecified, initial encounter: Secondary | ICD-10-CM

## 2023-08-27 DIAGNOSIS — D72819 Decreased white blood cell count, unspecified: Secondary | ICD-10-CM

## 2023-08-27 DIAGNOSIS — Z9013 Acquired absence of bilateral breasts and nipples: Secondary | ICD-10-CM

## 2023-08-27 DIAGNOSIS — R21 Rash and other nonspecific skin eruption: Secondary | ICD-10-CM | POA: Diagnosis not present

## 2023-08-27 DIAGNOSIS — D696 Thrombocytopenia, unspecified: Secondary | ICD-10-CM

## 2023-08-27 LAB — CBC
HCT: 38.4 % (ref 36.0–46.0)
Hemoglobin: 11.8 g/dL — ABNORMAL LOW (ref 12.0–15.0)
MCH: 28 pg (ref 26.0–34.0)
MCHC: 30.7 g/dL (ref 30.0–36.0)
MCV: 91 fL (ref 80.0–100.0)
Platelets: 145 K/uL — ABNORMAL LOW (ref 150–400)
RBC: 4.22 MIL/uL (ref 3.87–5.11)
RDW: 13.1 % (ref 11.5–15.5)
WBC: 3.9 K/uL — ABNORMAL LOW (ref 4.0–10.5)
nRBC: 0 % (ref 0.0–0.2)

## 2023-08-27 LAB — BASIC METABOLIC PANEL WITH GFR
Anion gap: 10 (ref 5–15)
BUN: 9 mg/dL (ref 6–20)
CO2: 25 mmol/L (ref 22–32)
Calcium: 8.4 mg/dL — ABNORMAL LOW (ref 8.9–10.3)
Chloride: 102 mmol/L (ref 98–111)
Creatinine, Ser: 0.5 mg/dL (ref 0.44–1.00)
GFR, Estimated: >60 mL/min (ref >60)
Glucose, Bld: 112 mg/dL — ABNORMAL HIGH (ref 70–99)
Potassium: 3.3 mmol/L — ABNORMAL LOW (ref 3.5–5.1)
Sodium: 137 mmol/L (ref 135–145)

## 2023-08-27 MED ORDER — EPINEPHRINE 0.3 MG/0.3ML IJ SOAJ
0.3000 mg | INTRAMUSCULAR | 2 refills | Status: AC | PRN
  Filled 2023-08-27: qty 2, 2d supply, fill #0

## 2023-08-27 NOTE — Discharge Instructions (Signed)
 Please take all prescribed medications exactly as instructed. Please consume a regular diet Please increase your physical activity as tolerated. Please maintain all outpatient follow-up appointments including follow-up with your primary care provder.   Please also seek an evaluation with an Allergist for identification of possible triggers. Please return to the emergency department if you develop recurrent shortness of breath, weakness, rash, chest pain or fevers in excess of 100.4 F.

## 2023-08-27 NOTE — Discharge Summary (Signed)
 Physician Discharge Summary   Patient: [name] MRN: [id number] DOB: May 18, 1970  Admit date:     08/23/2023  Discharge date: 08/27/23  Discharge Physician: Zachary JINNY Ba   PCP: Vicci Barnie NOVAK, MD   Recommendations at discharge:   Please take all prescribed medications exactly as instructed. Please consume a regular diet Please increase your physical activity as tolerated. Please maintain all outpatient follow-up appointments including follow-up with your primary care provder.   Please also seek an evaluation with an Allergist for identification of possible triggers. Please return to the emergency department if you develop recurrent shortness of breath, weakness, rash, chest pain or fevers in excess of 100.4 F.  Discharge Diagnoses: Principal Problem:   Anaphylaxis Active Problems:   Rash   Recurrent breast cancer, left (HCC)   S/P bilateral mastectomy  Resolved Problems:   * No resolved hospital problems. *   Hospital Course: 53 y.o. female with medical history significant for breast cancer status postmastectomy and chemoradiation who presented to [facility] emergency department with complaints of sudden onset of diffuse rash, nausea, lightheadedness and shortness of breath.     Upon evaluation in the emergency department patient exhibited severe lactic acidosis with concern either for severe allergic reaction or infectious process.  The hospitalist group was then called to assess the patient for admission of the hospital.    Patient was initially placed on broad-spectrum empiric antibiotic therapy.  Infectious disease consultation was obtained.  Infectious workup ended up being negative and it was felt the patient was likely suffering from an allergic reaction/anaphylaxis.    Patient clinically improved to baseline and was discharged home in improved and stable condition on 08/27/2023.  Patient was discharged home with an epinephrine  pen and advised to  schedule a follow-up appointment with [place] [facility] for further workup and identification of triggers.      Pain control - [place]  Controlled Substance Reporting System database was reviewed. and patient was instructed, not to drive, operate heavy machinery, perform activities at heights, swimming or participation in water  activities or provide baby-sitting services while on Pain, Sleep and Anxiety Medications; until their outpatient Physician has advised to do so again. Also recommended to not to take more than prescribed Pain, Sleep and Anxiety Medications.   Consultants: Infectious Disease Procedures performed: none  Disposition: Home Diet recommendation:  Discharge Diet Orders (From admission, onward)     Start     Ordered   08/27/23 0000  Diet general        08/27/23 1648           Regular diet  DISCHARGE MEDICATION: Allergies as of 08/27/2023       Reactions   Carboplatin  Other (See Comments)   Red, blotchy, hot        Medication List     STOP taking these medications    anastrozole  1 MG tablet Commonly known as: ARIMIDEX    cephALEXin  500 MG capsule Commonly known as: KEFLEX    clindamycin  300 MG capsule Commonly known as: CLEOCIN    oxyCODONE  5 MG immediate release tablet Commonly known as: Oxy IR/ROXICODONE    PRESCRIPTION MEDICATION   promethazine  12.5 MG tablet Commonly known as: PHENERGAN    scopolamine  1 MG/3DAYS Commonly known as: TRANSDERM-SCOP       TAKE these medications    diphenhydrAMINE  25 MG tablet Commonly known as: BENADRYL  Take 50 mg by mouth once.   docusate sodium  100 MG capsule Commonly known as: COLACE Take 100 mg by  mouth at bedtime.   EPINEPHrine  0.3 mg/0.3 mL Soaj injection Commonly known as: EPI-PEN Inject 0.3 mg into the muscle as needed (worsening symptoms).   gabapentin  100 MG capsule Commonly known as: NEURONTIN  Take 200 mg by mouth at bedtime. What changed: Another medication with the  same name was removed. Continue taking this medication, and follow the directions you see here.   traMADol  50 MG tablet Commonly known as: ULTRAM  Take 1 tablet (50 mg total) by mouth every 12 (twelve) hours as needed. What changed:  when to take this reasons to take this        Follow-up Information     Vicci Barnie NOVAK, MD. Schedule an appointment as soon as possible for a visit in 1 week(s).   Specialty: Internal Medicine Contact information: [address] [place] [address] [place] [address] [phone]         [facility] [place]. Schedule an appointment as soon as possible for a visit.   Specialty: Allergy and Asthma  Contact information: [address]  [place]  [address] [phone]                Discharge Exam: Fredricka Weights   08/23/23 2302 08/24/23 1512  Weight: 78.9 kg 78.5 kg    Constitutional: Awake alert and oriented x3, no associated distress.   Respiratory: clear to auscultation bilaterally, no wheezing, no crackles. Normal respiratory effort. No accessory muscle use.  Cardiovascular: Regular rate and rhythm, no murmurs / rubs / gallops. No extremity edema. 2+ pedal pulses. No carotid bruits.  Abdomen: Abdomen is soft and nontender.  No evidence of intra-abdominal masses.  Positive bowel sounds noted in all quadrants.   Musculoskeletal: No joint deformity upper and lower extremities. Good ROM, no contractures. Normal muscle tone.     Condition at discharge: fair  The results of significant diagnostics from this hospitalization (including imaging, microbiology, ancillary and laboratory) are listed below for reference.   Imaging Studies: CT Angio Chest PE W and/or Wo Contrast Result Date: 08/24/2023 CLINICAL DATA:  Treated for severe allergic reaction in the field by M mass, on arrival with shortness of breath and chest tightness. Concern for pulmonary embolism. Also with abdominal pain, acute,  nonlocalized. EXAM: CT ANGIOGRAPHY CHEST CT ABDOMEN AND PELVIS WITH CONTRAST TECHNIQUE: Multidetector CT imaging of the chest was performed using the standard protocol during bolus administration of intravenous contrast. Multiplanar CT image reconstructions and MIPs were obtained to evaluate the vascular anatomy. Multidetector CT imaging of the abdomen and pelvis was performed using the standard protocol during bolus administration of intravenous contrast. RADIATION DOSE REDUCTION: This exam was performed according to the departmental dose-optimization program which includes automated exposure control, adjustment of the mA and/or kV according to patient size and/or use of iterative reconstruction technique. CONTRAST:  OMNIPAQUE  IOHEXOL  350 MG/ML SOLN COMPARISON:  Chest CTs with IV contrast 12/04/2021 and 07/25/2016, CT abdomen pelvis with IV contrast 03/02/2021. FINDINGS: CTA CHEST FINDINGS Cardiovascular: The cardiac size is normal. There is no pericardial effusion. The pulmonary trunk is prominent measuring 3.3 cm indicating arterial hypertension, previously 3.4 cm. No arterial embolism is seen. The aorta and great vessels demonstrate no aneurysm, stenosis or dissection. There are mild calcific plaques in the aortic isthmus. Pulmonary veins are normal in caliber. There are no visible coronary calcifications. Mediastinum/Nodes: The lower poles of thyroid gland, axillary spaces, thoracic trachea, main bronchi, and thoracic esophagus are unremarkable. No intrathoracic adenopathy is seen. Lungs/Pleura:  There is post XRT subpleural reticulation anteriorly in the left upper lobe. Central airways clear. Previously there were ground-glass nodules in the left lung which have cleared. There are mild posterior atelectatic changes in both lungs. No consolidation, effusion, nodules or pneumothorax are seen. Musculoskeletal: Status post bilateral mastectomy. No chest wall mass is seen. No regional bone metastasis is seen.  Review of the MIP images confirms the above findings. CT ABDOMEN and PELVIS FINDINGS Hepatobiliary: Solitary 1.5 cm stone in the gallbladder. No wall thickening. No bile duct dilatation. No focal liver abnormality is seen. Pancreas: No abnormality. Spleen: The spleen mildly prominent 14.3 cm AP, unchanged.  No mass. Adrenals/Urinary Tract: Adrenal glands are unremarkable. Kidneys are normal, without renal calculi, focal lesion, or hydronephrosis. Bladder is unremarkable. Stomach/Bowel: No dilatation or wall thickening including the appendix. Vascular/Lymphatic: Aortic atherosclerosis. No enlarged abdominal or pelvic lymph nodes. Reproductive: Uterus and bilateral adnexa are unremarkable. Other: Small umbilical fat hernia. No incarcerated hernia. No free fluid, free air, or focal inflammatory process. Multiple pelvic phleboliths. Musculoskeletal: No regional bone metastasis is seen. Degenerative change noted lumbar spine with mild lumbar levoscoliosis. Early hip DJD. Review of the MIP images confirms the above findings. IMPRESSION: 1. No acute chest CT or CTA findings. 2. Prominent pulmonary trunk indicating arterial hypertension. No arterial embolus is seen. 3. Aortic atherosclerosis. 4. Cholelithiasis without evidence of cholecystitis. 5. Stable mild splenomegaly. 6. Small umbilical fat hernia. Aortic Atherosclerosis (ICD10-I70.0). Electronically Signed   By: Francis Quam M.D.   On: 08/24/2023 06:08   CT ABDOMEN PELVIS W CONTRAST Result Date: 08/24/2023 CLINICAL DATA:  Treated for severe allergic reaction in the field by M mass, on arrival with shortness of breath and chest tightness. Concern for pulmonary embolism. Also with abdominal pain, acute, nonlocalized. EXAM: CT ANGIOGRAPHY CHEST CT ABDOMEN AND PELVIS WITH CONTRAST TECHNIQUE: Multidetector CT imaging of the chest was performed using the standard protocol during bolus administration of intravenous contrast. Multiplanar CT image reconstructions and MIPs  were obtained to evaluate the vascular anatomy. Multidetector CT imaging of the abdomen and pelvis was performed using the standard protocol during bolus administration of intravenous contrast. RADIATION DOSE REDUCTION: This exam was performed according to the departmental dose-optimization program which includes automated exposure control, adjustment of the mA and/or kV according to patient size and/or use of iterative reconstruction technique. CONTRAST:  OMNIPAQUE  IOHEXOL  350 MG/ML SOLN COMPARISON:  Chest CTs with IV contrast 12/04/2021 and 07/25/2016, CT abdomen pelvis with IV contrast 03/02/2021. FINDINGS: CTA CHEST FINDINGS Cardiovascular: The cardiac size is normal. There is no pericardial effusion. The pulmonary trunk is prominent measuring 3.3 cm indicating arterial hypertension, previously 3.4 cm. No arterial embolism is seen. The aorta and great vessels demonstrate no aneurysm, stenosis or dissection. There are mild calcific plaques in the aortic isthmus. Pulmonary veins are normal in caliber. There are no visible coronary calcifications. Mediastinum/Nodes: The lower poles of thyroid gland, axillary spaces, thoracic trachea, main bronchi, and thoracic esophagus are unremarkable. No intrathoracic adenopathy is seen. Lungs/Pleura: There is post XRT subpleural reticulation anteriorly in the left upper lobe. Central airways clear. Previously there were ground-glass nodules in the left lung which have cleared. There are mild posterior atelectatic changes in both lungs. No consolidation, effusion, nodules or pneumothorax are seen. Musculoskeletal: Status post bilateral mastectomy. No chest wall mass is seen. No regional bone metastasis is seen. Review of the MIP images confirms the above findings. CT ABDOMEN and PELVIS FINDINGS Hepatobiliary: Solitary 1.5 cm stone in the gallbladder. No  wall thickening. No bile duct dilatation. No focal liver abnormality is seen. Pancreas: No abnormality. Spleen: The  spleen mildly prominent 14.3 cm AP, unchanged.  No mass. Adrenals/Urinary Tract: Adrenal glands are unremarkable. Kidneys are normal, without renal calculi, focal lesion, or hydronephrosis. Bladder is unremarkable. Stomach/Bowel: No dilatation or wall thickening including the appendix. Vascular/Lymphatic: Aortic atherosclerosis. No enlarged abdominal or pelvic lymph nodes. Reproductive: Uterus and bilateral adnexa are unremarkable. Other: Small umbilical fat hernia. No incarcerated hernia. No free fluid, free air, or focal inflammatory process. Multiple pelvic phleboliths. Musculoskeletal: No regional bone metastasis is seen. Degenerative change noted lumbar spine with mild lumbar levoscoliosis. Early hip DJD. Review of the MIP images confirms the above findings. IMPRESSION: 1. No acute chest CT or CTA findings. 2. Prominent pulmonary trunk indicating arterial hypertension. No arterial embolus is seen. 3. Aortic atherosclerosis. 4. Cholelithiasis without evidence of cholecystitis. 5. Stable mild splenomegaly. 6. Small umbilical fat hernia. Aortic Atherosclerosis (ICD10-I70.0). Electronically Signed   By: Francis Quam M.D.   On: 08/24/2023 06:08   DG Chest Port 1 View Result Date: 08/24/2023 CLINICAL DATA:  Questionable sepsis-evaluate for abnormality EXAM: PORTABLE CHEST 1 VIEW COMPARISON:  12/01/2021 FINDINGS: The heart size and mediastinal contours are within normal limits. Both lungs are clear. The visualized skeletal structures are unremarkable. IMPRESSION: No active disease. Electronically Signed   By: Norman Gatlin M.D.   On: 08/24/2023 01:24    Microbiology: Results for orders placed or performed during the hospital encounter of 08/23/23  Resp panel by RT-PCR (RSV, Flu A&B, Covid) Anterior Nasal Swab     Status: None   Collection Time: 08/24/23  1:25 AM   Specimen: Anterior Nasal Swab  Result Value Ref Range Status   SARS Coronavirus 2 by RT PCR NEGATIVE NEGATIVE Final    Comment:  (NOTE) SARS-CoV-2 target nucleic acids are NOT DETECTED.  The SARS-CoV-2 RNA is generally detectable in upper respiratory specimens during the acute phase of infection. The lowest concentration of SARS-CoV-2 viral copies this assay can detect is 138 copies/mL. A negative result does not preclude SARS-Cov-2 infection and should not be used as the sole basis for treatment or other patient management decisions. A negative result may occur with  improper specimen collection/handling, submission of specimen other than nasopharyngeal swab, presence of viral mutation(s) within the areas targeted by this assay, and inadequate number of viral copies(<138 copies/mL). A negative result must be combined with clinical observations, patient history, and epidemiological information. The expected result is Negative.  Fact Sheet for Patients:  BloggerCourse.com  Fact Sheet for Healthcare Providers:  SeriousBroker.it  This test is no t yet approved or cleared by the [place]  FDA and  has been authorized for detection and/or diagnosis of SARS-CoV-2 by FDA under an Emergency Use Authorization (EUA). This EUA will remain  in effect (meaning this test can be used) for the duration of the COVID-19 declaration under Section 564(b)(1) of the Act, 21 U.S.C.section 360bbb-3(b)(1), unless the authorization is terminated  or revoked sooner.       Influenza A by PCR NEGATIVE NEGATIVE Final   Influenza B by PCR NEGATIVE NEGATIVE Final    Comment: (NOTE) The Xpert Xpress SARS-CoV-2/FLU/RSV plus assay is intended as an aid in the diagnosis of influenza from Nasopharyngeal swab specimens and should not be used as a sole basis for treatment. Nasal washings and aspirates are unacceptable for Xpert Xpress SARS-CoV-2/FLU/RSV testing.  Fact Sheet for Patients: BloggerCourse.com  Fact Sheet for Healthcare  Providers: SeriousBroker.it  This  test is not yet approved or cleared by the [place]  FDA and has been authorized for detection and/or diagnosis of SARS-CoV-2 by FDA under an Emergency Use Authorization (EUA). This EUA will remain in effect (meaning this test can be used) for the duration of the COVID-19 declaration under Section 564(b)(1) of the Act, 21 U.S.C. section 360bbb-3(b)(1), unless the authorization is terminated or revoked.     Resp Syncytial Virus by PCR NEGATIVE NEGATIVE Final    Comment: (NOTE) Fact Sheet for Patients: BloggerCourse.com  Fact Sheet for Healthcare Providers: SeriousBroker.it  This test is not yet approved or cleared by the [place]  FDA and has been authorized for detection and/or diagnosis of SARS-CoV-2 by FDA under an Emergency Use Authorization (EUA). This EUA will remain in effect (meaning this test can be used) for the duration of the COVID-19 declaration under Section 564(b)(1) of the Act, 21 U.S.C. section 360bbb-3(b)(1), unless the authorization is terminated or revoked.  Performed at [facility], [address], [place], [place] [address]   Blood Culture (routine x 2)     Status: None (Preliminary result)   Collection Time: 08/24/23  2:31 AM   Specimen: BLOOD  Result Value Ref Range Status   Specimen Description   Final    BLOOD BLOOD RIGHT HAND Performed at [facility], [address], [place], [place] [address]    Special Requests   Final    BOTTLES DRAWN AEROBIC AND ANAEROBIC Blood Culture adequate volume Performed at [facility], [address], [place], [place] [address]    Culture   Final    NO GROWTH 3 DAYS Performed at [facility], [address], [place], [place] [address]    Report Status PENDING  Incomplete  Blood Culture (routine x 2)     Status: None (Preliminary result)    Collection Time: 08/24/23  2:31 AM   Specimen: BLOOD  Result Value Ref Range Status   Specimen Description   Final    BLOOD BLOOD RIGHT ARM Performed at [facility], [address], [place], [place] [address]    Special Requests   Final    BOTTLES DRAWN AEROBIC AND ANAEROBIC Blood Culture adequate volume Performed at [facility], [address], [place], [place] [address]    Culture   Final    NO GROWTH 3 DAYS Performed at [facility], [address] [place], [place] [address]    Report Status PENDING  Incomplete   *Note: Due to a large number of results and/or encounters for the requested time period, some results have not been displayed. A complete set of results can be found in Results Review.    Labs: CBC: Recent Labs  Lab 08/24/23 0009 08/25/23 0501 08/26/23 0521 08/27/23 0500  WBC 8.4 4.4 4.2 3.9*  NEUTROABS 7.4  --   --   --   HGB 12.6 11.6* 11.3* 11.8*  HCT 38.9 37.0 36.9 38.4  MCV 88.0 90.5 91.8 91.0  PLT 163 109* 120* 145*   Basic Metabolic Panel: Recent Labs  Lab 08/24/23 0009 08/24/23 0100 08/25/23 0501 08/26/23 0521 08/27/23 0500  NA 134*  --  138 143 137  K 3.6  --  3.0* 3.6 3.3*  CL 103  --  106 110 102  CO2 22  --  25 25 25   GLUCOSE 126*  --  117* 111* 112*  BUN 14  --  11 10 9  CREATININE 0.61  --  0.59 0.58 0.50  CALCIUM 8.7*  --  8.6* 8.4* 8.4*  MG  --  1.7  --   --   --    Liver Function Tests: Recent Labs  Lab 08/24/23 0100  AST 23  ALT 15  ALKPHOS 64  BILITOT 1.0  PROT 7.0  ALBUMIN  3.7   CBG: No results for input(s): GLUCAP in the last 168 hours.  Discharge time spent: greater than 30 minutes.  Signed: Zachary JINNY Ba, MD [facility] 08/27/2023

## 2023-08-27 NOTE — Progress Notes (Signed)
 [facility] Infectious Diseases Follow Up Note  Patient Identification: Patient Name: [name] MRN: [id number] Admit Date: 08/23/2023 10:57 PM Age: 53 y.o.Today's Date: 08/27/2023  Reason for Visit: Acute allergic reaction, rash  Principal Problem:   Septic shock (HCC)  Antibiotics:  Vancomycin  8/18 - 8/21 Cefepime  8/18 -8/21 Metronidazole  8/18 - 8/21   Lines/Hardware:  Interval Events: Remains afebrile Labs remarkable for K3.3, lactic acid normal 0.7, WBC 3.9, hemoglobin 11.8 and platelets 145   Assessment 12 Y O Female with prior h/o breast ca s/p mastectomy, chemoradiation in remission, total hysterectomy with b/l salphingo-oophorectomy who presented to ED on 8/19 due to concerns for acute allergic reaction with   # Acute onset rash, resolved  # Fevers, resolved # Lactic acidosis, resolved  - differentials:  unknown allergic reaction, does not seem to be consistent with bacterial toxic shock syndrome ( Cr normal, Liver enzymes and coags not done, mental status intact, did not require vasopressors, no evidence of multi system involvement) - Prior rash has already resolved - Doubt cellulitis or necrotizing SSTI or infective etiology    # Hypokalemia - to be repleted # Leukopenia/Thrombocytopenia - likely 2/2 above and expect to improve  Recommendations - DC cefazolin   - Universal/standard isolation precaution ID will sign off.  Recall back with questions or concerns.  Rest of the management as per the primary team. Thank you for the consult. Please page with pertinent questions or concerns.  ______________________________________________________________________ Subjective patient seen and examined at the bedside.  No complaints.  Rash is resolved.   Vitals BP 133/72 (BP Location: Right Arm)   Pulse 74   Temp 98.4 F (36.9 C)   Resp 15   Ht 5' 7 (1.702 m)   Wt 78.5 kg   LMP 06/25/2016   SpO2 100%    BMI 27.10 kg/m     Physical Exam Constitutional: Adult female sitting up in the bed, comfortable    Comments: ENT WNL  Cardiovascular:     Rate and Rhythm: Normal rate and regular rhythm.     Heart sounds:  Pulmonary:     Effort: Pulmonary effort is normal.     Comments:   Abdominal:     Palpations: Abdomen is soft.     Tenderness: Prior rash is resolved, with some remaining discoloration.  No signs of cellulitis or abscess or nec fasc  Musculoskeletal:        General: No swelling or tenderness peripheral joints  Skin:    Comments: No rashes no oral ulcers  Neurological:     General: Awake, alert and oriented, grossly nonfocal  Psychiatric:        Mood and Affect: Mood normal.   Pertinent Microbiology Results for orders placed or performed during the hospital encounter of 08/23/23  Resp panel by RT-PCR (RSV, Flu A&B, Covid) Anterior Nasal Swab     Status: None   Collection Time: 08/24/23  1:25 AM   Specimen: Anterior Nasal Swab  Result Value Ref Range Status   SARS Coronavirus 2 by RT PCR NEGATIVE NEGATIVE Final    Comment: (NOTE) SARS-CoV-2 target nucleic acids are NOT DETECTED.  The SARS-CoV-2 RNA is generally detectable in upper respiratory specimens during the acute phase of infection. The lowest concentration of SARS-CoV-2 viral copies this assay can detect is 138 copies/mL. A negative result does not preclude SARS-Cov-2 infection and should not be used as the sole basis for treatment or other patient management decisions. A negative result may occur with  improper  specimen collection/handling, submission of specimen other than nasopharyngeal swab, presence of viral mutation(s) within the areas targeted by this assay, and inadequate number of viral copies(<138 copies/mL). A negative result must be combined with clinical observations, patient history, and epidemiological information. The expected result is Negative.  Fact Sheet for Patients:   BloggerCourse.com  Fact Sheet for Healthcare Providers:  SeriousBroker.it  This test is no t yet approved or cleared by the [place]  FDA and  has been authorized for detection and/or diagnosis of SARS-CoV-2 by FDA under an Emergency Use Authorization (EUA). This EUA will remain  in effect (meaning this test can be used) for the duration of the COVID-19 declaration under Section 564(b)(1) of the Act, 21 U.S.C.section 360bbb-3(b)(1), unless the authorization is terminated  or revoked sooner.       Influenza A by PCR NEGATIVE NEGATIVE Final   Influenza B by PCR NEGATIVE NEGATIVE Final    Comment: (NOTE) The Xpert Xpress SARS-CoV-2/FLU/RSV plus assay is intended as an aid in the diagnosis of influenza from Nasopharyngeal swab specimens and should not be used as a sole basis for treatment. Nasal washings and aspirates are unacceptable for Xpert Xpress SARS-CoV-2/FLU/RSV testing.  Fact Sheet for Patients: BloggerCourse.com  Fact Sheet for Healthcare Providers: SeriousBroker.it  This test is not yet approved or cleared by the [place]  FDA and has been authorized for detection and/or diagnosis of SARS-CoV-2 by FDA under an Emergency Use Authorization (EUA). This EUA will remain in effect (meaning this test can be used) for the duration of the COVID-19 declaration under Section 564(b)(1) of the Act, 21 U.S.C. section 360bbb-3(b)(1), unless the authorization is terminated or revoked.     Resp Syncytial Virus by PCR NEGATIVE NEGATIVE Final    Comment: (NOTE) Fact Sheet for Patients: BloggerCourse.com  Fact Sheet for Healthcare Providers: SeriousBroker.it  This test is not yet approved or cleared by the [place]  FDA and has been authorized for detection and/or diagnosis of SARS-CoV-2 by FDA under an Emergency Use  Authorization (EUA). This EUA will remain in effect (meaning this test can be used) for the duration of the COVID-19 declaration under Section 564(b)(1) of the Act, 21 U.S.C. section 360bbb-3(b)(1), unless the authorization is terminated or revoked.  Performed at [facility], [address] [place], [place] [address]   Blood Culture (routine x 2)     Status: None (Preliminary result)   Collection Time: 08/24/23  2:31 AM   Specimen: BLOOD  Result Value Ref Range Status   Specimen Description   Final    BLOOD BLOOD RIGHT HAND Performed at [facility], [address] [place], [place] [address]    Special Requests   Final    BOTTLES DRAWN AEROBIC AND ANAEROBIC Blood Culture adequate volume Performed at [facility], [address], [place], [place] [address]    Culture   Final    NO GROWTH 2 DAYS Performed at [facility], [address] [place], [place] [address]    Report Status PENDING  Incomplete  Blood Culture (routine x 2)     Status: None (Preliminary result)   Collection Time: 08/24/23  2:31 AM   Specimen: BLOOD  Result Value Ref Range Status   Specimen Description   Final    BLOOD BLOOD RIGHT ARM Performed at [facility], [address] [place], [place] [address]    Special Requests   Final    BOTTLES DRAWN AEROBIC AND ANAEROBIC Blood  Culture adequate volume Performed at [facility], [address] [place], [place] [address]    Culture   Final    NO GROWTH 2 DAYS Performed at [facility], [address], [place], [place] [address]    Report Status PENDING  Incomplete   *Note: Due to a large number of results and/or encounters for the requested time period, some results have not been displayed. A complete set of results can be found in Results Review.   Pertinent Lab.    Latest Ref Rng & Units 08/27/2023    5:00 AM 08/26/2023    5:21 AM 08/25/2023     5:01 AM  CBC  WBC 4.0 - 10.5 K/uL 3.9  4.2  4.4   Hemoglobin 12.0 - 15.0 g/dL 88.1  88.6  88.3   Hematocrit 36.0 - 46.0 % 38.4  36.9  37.0   Platelets 150 - 400 K/uL 145  120  109       Latest Ref Rng & Units 08/27/2023    5:00 AM 08/26/2023    5:21 AM 08/25/2023    5:01 AM  CMP  Glucose 70 - 99 mg/dL 887  888  882   BUN 6 - 20 mg/dL 9  10  11    Creatinine 0.44 - 1.00 mg/dL 9.49  9.41  9.40   Sodium 135 - 145 mmol/L 137  143  138   Potassium 3.5 - 5.1 mmol/L 3.3  3.6  3.0   Chloride 98 - 111 mmol/L 102  110  106   CO2 22 - 32 mmol/L 25  25  25    Calcium 8.9 - 10.3 mg/dL 8.4  8.4  8.6    Pertinent Imaging today Plain films and CT images have been personally visualized and interpreted; radiology reports have been reviewed. Decision making incorporated into the Impression /   No results found.   I spent 35 minutes involved in face-to-face and non-face-to-face activities for this patient on the day of the visit. Professional time spent includes the following activities: Preparing to see the patient (review of tests), Obtaining and reviewing separately obtained history (hospitalist progress note), Performing a medically appropriate examination and evaluation, discontinuing  medications, referring and communicating with other health care professionals, Documenting clinical information in the EMR, Independently interpreting results (not separately reported), Communicating results to the patient, Counseling and educating the patient and Care coordination (not separately reported).   Plan d/w requesting provider as well as ID pharm D  Of note, portions of this note may have been created with voice recognition software. While this note has been edited for accuracy, occasional wrong-word or 'sound-a-like' substitutions may have occurred due to the inherent limitations of voice recognition software.   Electronically signed by:   Annalee Orem, MD Infectious Disease Physician [facility] for Infectious Disease Pager: [phone]

## 2023-08-27 NOTE — Plan of Care (Signed)
°  Problem: Clinical Measurements: °Goal: Diagnostic test results will improve °Outcome: Progressing °  °Problem: Clinical Measurements: °Goal: Respiratory complications will improve °Outcome: Progressing °  °Problem: Clinical Measurements: °Goal: Cardiovascular complication will be avoided °Outcome: Progressing °  °Problem: Activity: °Goal: Risk for activity intolerance will decrease °Outcome: Progressing °  °Problem: Nutrition: °Goal: Adequate nutrition will be maintained °Outcome: Progressing °  °

## 2023-08-27 NOTE — Hospital Course (Signed)
 53 y.o. female with medical history significant for breast cancer status postmastectomy and chemoradiation who presented to [facility] emergency department with complaints of sudden onset of diffuse rash, nausea, lightheadedness and shortness of breath.     Upon evaluation in the emergency department patient exhibited severe lactic acidosis with concern either for severe allergic reaction or infectious process.  The hospitalist group was then called to assess the patient for admission of the hospital.    Patient was initially placed on broad-spectrum empiric antibiotic therapy.  Infectious disease consultation was obtained.  Infectious workup ended up being negative and it was felt the patient was likely suffering from an allergic reaction/anaphylaxis.    Patient clinically improved to baseline and was discharged home in improved and stable condition on 08/27/2023.  Patient was discharged home with an epinephrine  pen and advised to schedule a follow-up appointment with [facility] allergy and asthma for further workup and identification of triggers.

## 2023-08-28 ENCOUNTER — Other Ambulatory Visit (HOSPITAL_COMMUNITY): Payer: Self-pay

## 2023-08-29 LAB — CULTURE, BLOOD (ROUTINE X 2)
Culture: NO GROWTH
Culture: NO GROWTH
Special Requests: ADEQUATE
Special Requests: ADEQUATE

## 2023-08-30 ENCOUNTER — Telehealth: Payer: Self-pay | Admitting: *Deleted

## 2023-08-30 ENCOUNTER — Telehealth: Payer: Self-pay | Admitting: Hematology and Oncology

## 2023-08-30 NOTE — Transitions of Care (Post Inpatient/ED Visit) (Signed)
 08/30/2023  Name: [name] MRN: [id number] DOB: 27-Feb-1970  Today's TOC FU Call Status: Today's TOC FU Call Status:: Successful TOC FU Call Completed TOC FU Call Complete Date: 08/30/23 Patient's Name and Date of Birth confirmed.  Transition Care Management Follow-up Telephone Call Date of Discharge: 08/27/23 Discharge Facility: [facility] Type of Discharge: Inpatient Admission Primary Inpatient Discharge Diagnosis:: Anaphylaxis How have you been since you were released from the hospital?: Better Any questions or concerns?: Yes Patient Questions/Concerns:: Patient concerned about what caused her allergic reaction. Patient Questions/Concerns Addressed: Other: (RNCM advised patient to schedule with Allergy and Asthma for an evaluation)  Items Reviewed: Did you receive and understand the discharge instructions provided?: Yes Medications obtained,verified, and reconciled?: Yes (Medications Reviewed) Any new allergies since your discharge?: No Dietary orders reviewed?: Yes Type of Diet Ordered:: General diet Do you have support at home?: Yes People in Home [RPT]: spouse Name of Support/Comfort Primary Source: Spouse/Mohamad  Medications Reviewed Today: Medications Reviewed Today     Reviewed by Lucky Andrea LABOR, RN (Registered Nurse) on 08/30/23 at 1601  Med List Status: <None>   Medication Order Taking? Sig Documenting Provider Last Dose Status Informant  diphenhydrAMINE  (BENADRYL ) 25 MG tablet [id number] Yes Take 50 mg by mouth once.  Patient taking differently: Take 50 mg by mouth every 6 (six) hours as needed.   [provider]  Active Self, Pharmacy Records  docusate sodium  (COLACE) 100 MG capsule [id number] Yes Take 100 mg by mouth at bedtime. [provider]  Active Self, Pharmacy Records  EPINEPHrine  0.3 mg/0.3 mL IJ SOAJ injection [id number] Yes Inject 0.3 mg into the muscle as needed (worsening symptoms). Shalhoub, Zachary PARAS, MD  Active    gabapentin  (NEURONTIN ) 100 MG capsule [id number] Yes Take 200 mg by mouth at bedtime. [provider]  Active Self, Pharmacy Records           Med Note (CRUTHIS, CHLOE C   Tue Aug 24, 2023  2:03 PM) Pt is adamant this is the dose she is taking. Dispense report does not support this claim.   traMADol  (ULTRAM ) 50 MG tablet [id number] Yes Take 1 tablet (50 mg total) by mouth every 12 (twelve) hours as needed.  Patient taking differently: Take 50 mg by mouth daily as needed for severe pain (pain score 7-10) or moderate pain (pain score 4-6).   Odean Potts, MD  Active Self, Pharmacy Records   Patient not taking:   Discontinued 03/28/20 1200 (Stop Taking at Discharge)           Home Care and Equipment/Supplies: Were Home Health Services Ordered?: No Any new equipment or medical supplies ordered?: No  Functional Questionnaire: Do you need assistance with bathing/showering or dressing?: No Do you need assistance with meal preparation?: No Do you need assistance with eating?: No Do you have difficulty maintaining continence: No Do you need assistance with getting out of bed/getting out of a chair/moving?: No Do you have difficulty managing or taking your medications?: No  Follow up appointments reviewed: PCP Follow-up appointment confirmed?: No (Patient prefers to only follow up with Oncologist, Dr. Odean) MD Provider Line Number:[phone] Given: No Specialist Hospital Follow-up appointment confirmed?: NA Do you need transportation to your follow-up appointment?: No Do you understand care options if your condition(s) worsen?: Yes-patient verbalized understanding  SDOH Interventions Today    Flowsheet Row Most Recent Value  SDOH Interventions   Food Insecurity Interventions Intervention Not Indicated  Housing Interventions Intervention Not Indicated  Transportation  Interventions Intervention Not Indicated  Utilities Interventions Intervention Not Indicated    Goals  Addressed             This Visit's Progress    [facility] Transitions of Care (TOC) Care Plan       Problems:  Recent Hospitalization for treatment of Anaphylaxis Knowledge Deficit Related to unknown cause for Anaphylaxis  Goal:  Over the next 30 days, the patient will not experience hospital readmission  Interventions:  Transitions of Care: Doctor Visits  - discussed the importance of doctor visits Medication review-verified patient has Epi Pen on hand to use as needed Provided therapeutic listening Advised patient to contact Asthma and Allergy to schedule an appointment for evaluation Advised patient to contact Dr. Rodman) for earlier appointment-Patient prefers to follow with Oncology due to her extensive history Discussed referral to [facility] LCSW for managing stress-patient declines at this time  Patient Self Care Activities:  Attend all scheduled provider appointments Call provider office for new concerns or questions  Notify RN Care Manager of [facility] call rescheduling needs Participate in Transition of Care Program/Attend [facility] scheduled calls  Plan:  Telephone follow up appointment with care management team member scheduled for:  09/08/23 at 3:30pm        Andrea Dimes RN, BSN   [facility] RN Care Manager [phone]

## 2023-08-30 NOTE — Telephone Encounter (Signed)
 I lVM informing [name] of her re-scheduled lab appointment. It has changed from 9/12 at 2:30 to 9/12 at 2:45. I asked [name] to return my call if she needs to re-schedule.

## 2023-08-30 NOTE — Transitions of Care (Post Inpatient/ED Visit) (Signed)
   08/30/2023  Name: [name] MRN: [id number] DOB: 1970/07/30  Today's TOC FU Call Status: Today's TOC FU Call Status:: Unsuccessful Call (1st Attempt) Unsuccessful Call (1st Attempt) Date: 08/30/23  Attempted to reach the patient regarding the most recent Inpatient/ED visit.  Ms. Hendler was unable to talk at this time and request a call back after lunchtime today. RNCM will attempt an outreach later today.  Follow Up Plan: Additional outreach attempts will be made to reach the patient to complete the Transitions of Care (Post Inpatient/ED visit) call.   Andrea Dimes RN, BSN [place]  [facility] RN Care Manager [phone]

## 2023-09-08 ENCOUNTER — Other Ambulatory Visit (HOSPITAL_COMMUNITY): Payer: Self-pay

## 2023-09-08 ENCOUNTER — Telehealth: Payer: Self-pay | Admitting: *Deleted

## 2023-09-08 ENCOUNTER — Encounter: Payer: Self-pay | Admitting: *Deleted

## 2023-09-09 ENCOUNTER — Encounter: Payer: Self-pay | Admitting: *Deleted

## 2023-09-10 ENCOUNTER — Encounter: Payer: Self-pay | Admitting: *Deleted

## 2023-09-13 ENCOUNTER — Telehealth: Payer: Self-pay | Admitting: *Deleted

## 2023-09-13 NOTE — Transitions of Care (Post Inpatient/ED Visit) (Signed)
   09/13/2023  Name: [name] MRN: [id number] DOB: 07-Aug-1970  RNCM received call/message from patient's spouse/DPR/Mohamad Muckle requesting assistance. DPR shared with RNCM that patient was currently admitted at a facility(non [facility]) and he would like assistance with a discharge plan. RNCM explained that the admitting facility would develop and provide a discharge plan of care. This RNCM advised DPR to connect with the facility and request a family meeting to discuss discharge plan of care. DPR/Spouse voiced understanding.  Andrea Dimes RN, BSN [place]  [facility] RN Care Manager [phone]

## 2023-09-14 ENCOUNTER — Other Ambulatory Visit (HOSPITAL_COMMUNITY): Payer: Self-pay

## 2023-09-14 ENCOUNTER — Encounter: Payer: Self-pay | Admitting: Hematology and Oncology

## 2023-09-14 MED ORDER — FLUOXETINE HCL 20 MG PO CAPS
20.0000 mg | ORAL_CAPSULE | Freq: Every day | ORAL | 1 refills | Status: DC
  Filled 2023-09-14: qty 30, 30d supply, fill #0

## 2023-09-14 MED ORDER — DOCUSATE SODIUM 100 MG PO CAPS
100.0000 mg | ORAL_CAPSULE | Freq: Every day | ORAL | 1 refills | Status: DC
  Filled 2023-09-14: qty 30, 30d supply, fill #0

## 2023-09-14 MED ORDER — TRAZODONE HCL 50 MG PO TABS
50.0000 mg | ORAL_TABLET | Freq: Every evening | ORAL | 1 refills | Status: DC | PRN
  Filled 2023-09-14: qty 30, 30d supply, fill #0

## 2023-09-14 MED ORDER — HYDROXYZINE HCL 50 MG PO TABS
50.0000 mg | ORAL_TABLET | Freq: Three times a day (TID) | ORAL | 1 refills | Status: DC | PRN
  Filled 2023-09-14: qty 90, 30d supply, fill #0

## 2023-09-14 MED ORDER — GABAPENTIN 100 MG PO CAPS
200.0000 mg | ORAL_CAPSULE | Freq: Every day | ORAL | 1 refills | Status: AC
  Filled 2023-09-14: qty 60, 30d supply, fill #0
  Filled 2023-12-16: qty 60, 30d supply, fill #1

## 2023-09-17 ENCOUNTER — Inpatient Hospital Stay

## 2023-09-17 ENCOUNTER — Inpatient Hospital Stay: Attending: Hematology and Oncology

## 2023-10-14 ENCOUNTER — Telehealth: Payer: Self-pay | Admitting: Hematology and Oncology

## 2023-10-14 ENCOUNTER — Inpatient Hospital Stay: Payer: Medicaid Other | Admitting: Hematology and Oncology

## 2023-10-14 NOTE — Telephone Encounter (Signed)
 left vm for pt to call back and reschedule missed appt

## 2023-10-14 NOTE — Assessment & Plan Note (Deleted)
 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  ERV B2 and PI K3 CA were negative, and the microsatellite status was stable, with low mutational burden (4/MB) Protracted healing from mastectomies Bilateral salpingo-oophorectomy March 2023     Current treatment:  Anastrozole  started 04/18/2020 Anastrozole  toxicities: Body aches and pains.   Olaparib  08/13/2020-10/09/21    Breast cancer surveillance: 1.  Breast exam 10/12/2022: Benign 2. no role of imaging since she had bilateral mastectomies.   July 2024: Signatera testing: Neg February 2025: Insufficient sample for Signatera.  Phlebotomist had a difficult time getting blood.  Therefore she would like to do the Signatera testing at our clinic.  We will set her up for that.   Her daughter who took care of her during her cancer journey has been accepted to [employer] and due for med school.  She is very excited about that.   Return to clinic in 1 year for follow-up

## 2023-11-01 ENCOUNTER — Telehealth: Payer: Self-pay | Admitting: Gastroenterology

## 2023-11-01 ENCOUNTER — Inpatient Hospital Stay: Attending: Hematology and Oncology

## 2023-11-01 ENCOUNTER — Inpatient Hospital Stay: Admitting: Hematology and Oncology

## 2023-11-01 VITALS — BP 90/68 | HR 90 | Temp 98.7°F | Resp 18 | Ht 67.0 in | Wt 183.1 lb

## 2023-11-01 DIAGNOSIS — Z1722 Progesterone receptor negative status: Secondary | ICD-10-CM | POA: Insufficient documentation

## 2023-11-01 DIAGNOSIS — Z171 Estrogen receptor negative status [ER-]: Secondary | ICD-10-CM

## 2023-11-01 DIAGNOSIS — Z923 Personal history of irradiation: Secondary | ICD-10-CM | POA: Insufficient documentation

## 2023-11-01 DIAGNOSIS — Z170 Estrogen receptor positive status [ER+]: Secondary | ICD-10-CM | POA: Diagnosis not present

## 2023-11-01 DIAGNOSIS — Z9221 Personal history of antineoplastic chemotherapy: Secondary | ICD-10-CM | POA: Insufficient documentation

## 2023-11-01 DIAGNOSIS — C50412 Malignant neoplasm of upper-outer quadrant of left female breast: Secondary | ICD-10-CM | POA: Insufficient documentation

## 2023-11-01 DIAGNOSIS — Z9013 Acquired absence of bilateral breasts and nipples: Secondary | ICD-10-CM | POA: Insufficient documentation

## 2023-11-01 DIAGNOSIS — Z1732 Human epidermal growth factor receptor 2 negative status: Secondary | ICD-10-CM | POA: Diagnosis not present

## 2023-11-01 LAB — GENETIC SCREENING ORDER

## 2023-11-01 NOTE — Telephone Encounter (Signed)
 Good afternoon Dr. Suzann,   I received a call from this patient stating that she would like to schedule for a colonoscopy this year due to her insurance ending this year. Patient stated she was seen here at our practice with Dr. Teressa. Patient last colonosocopy was January 13 th of 2023 and has a recalled placed for January 2026. Would you please advise on how to schedule this patient.    Thank you.

## 2023-11-01 NOTE — Assessment & Plan Note (Addendum)
 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  ERV B2 and PI K3 CA were negative, and the microsatellite status was stable, with low mutational burden (4/MB) Protracted healing from mastectomies Bilateral salpingo-oophorectomy March 2023 Hospitalization: 08/23/2023-08/27/2023: Anaphylaxis to unknown substance  Current treatment:  Anastrozole  started 04/18/2020 Anastrozole  toxicities: Body aches and pains.   Olaparib  08/13/2020-10/09/21    Breast cancer surveillance: 1.  Breast exam 10/12/2022: Benign 2. no role of imaging since she had bilateral mastectomies.   July 2024: Signatera testing: Neg February 2025: Insufficient sample for Signatera.  Phlebotomist had a difficult time getting blood.  Therefore she would like to do the Signatera testing at our clinic.  We will set her up for that.   Her daughter who took care of her during her cancer journey has been accepted to [employer] and due for med school.  She is very excited about that.   Return to clinic in 1 year for follow-up

## 2023-11-01 NOTE — Progress Notes (Signed)
 Patient Care Team: Vicci Barnie NOVAK, MD as PCP - General (Internal Medicine) Izell Domino, MD as Attending Physician (Radiation Oncology) Crawford, Morna Pickle, NP as Nurse Practitioner (Hematology and Oncology) Ebbie Cough, MD as Consulting Physician (General Surgery) Pace, Craig RAMAN, MD as Consulting Physician (Plastic Surgery) Tyree Nanetta SAILOR, RN as Oncology Nurse Navigator Teressa Toribio SQUIBB, MD (Inactive) as Attending Physician (Gastroenterology) Rufus Ozell MATSU, MD as Consulting Physician (Internal Medicine)  DIAGNOSIS:  Encounter Diagnosis  Name Primary?   Malignant neoplasm of upper-outer quadrant of left breast in female, estrogen receptor negative (HCC) Yes    SUMMARY OF ONCOLOGIC HISTORY: Oncology History  Malignant neoplasm of upper-outer quadrant of left breast in female, estrogen receptor negative (HCC)  06/19/2016 Initial Diagnosis   status post left breast upper outer quadrant and left axillary lymph node biopsy, both positive for a clinical T2-T3 N1, stage IIIB-C invasive ductal carcinoma, grade 3, triple negative, with an MIB-1 of 90%   07/20/2016 - 12/21/2016 Neo-Adjuvant Chemotherapy   neoadjuvant chemotherapy to consist of doxorubicin  and cyclophosphamide  in dose dense fashion 4 completed 09/10/2016 to be followed by paclitaxel  weekly 12 given with carboplatin               (a) cycle 4 of cyclophosphamide  and doxorubicin  was delayed 10 days and dose decreased 10% because of febrile neutropenia after cycle 3             (b) cycle 6 of Paclitaxel  and Carboplatin  delayed due to neutropenia, therefore Granix  added to Wednesday, Thursday, Friday following chemotherapy days   08/17/2016 Genetic Testing   BRCA2 c.8169T>A and MSH2 c.2050G>T VUS found on the common hereditary cancer panel.  The Hereditary Gene Panel offered by [facility] includes sequencing and/or deletion duplication testing of the following 46 genes: APC, ATM, AXIN2, BARD1, BMPR1A, BRCA1,  BRCA2, BRIP1, CDH1, CDKN2A (p14ARF), CDKN2A (p16INK4a), CHEK2, CTNNA1, DICER1, EPCAM (Deletion/duplication testing only), GREM1 (promoter region deletion/duplication testing only), KIT, MEN1, MLH1, MSH2, MSH3, MSH6, MUTYH, NBN, NF1, NHTL1, PALB2, PDGFRA, PMS2, POLD1, POLE, PTEN, RAD50, RAD51C, RAD51D, SDHB, SDHC, SDHD, SMAD4, SMARCA4. STK11, TP53, TSC1, TSC2, and VHL.  The following genes were evaluated for sequence changes only: SDHA and HOXB13 c.251G>A variant only.  The report date is August 17, 2016.    01/27/2017 Surgery   status post left lumpectomy and left axillary sentinel lymph node sampling with pathology showing a complete pathologic response (ypT0 ypN0); 5 left axillary lymph nodes removed      03/11/2017 - 04/21/2017 Radiation Therapy   adjuvant radiation 03/11/2017-04/21/2017  Site/dose:   1. Left breast, 2 Gy in 25 fractions for a total dose of 50 Gy                      2. Boost, 2 Gy in 5 fractions for a total dose of 10 Gy     02/19/2020 - 02/19/2020 Chemotherapy   Patient is on Treatment Plan : BREAST Adjuvant CMF IV q21d     Recurrent breast cancer, left (HCC)  12/16/2019 Initial Diagnosis   Recurrent breast cancer, left (HCC)   02/19/2020 - 02/19/2020 Chemotherapy   Patient is on Treatment Plan : BREAST Adjuvant CMF IV q21d     02/19/2020 Cancer Staging   Staging form: Breast, AJCC 8th Edition - Clinical: Stage Unknown (cT2, cNX, cM0, G2, ER+, PR-, HER2: Equivocal) - Signed by Layla Sandria BROCKS, MD on 02/19/2020     CHIEF COMPLIANT: Surveillance of breast cancer  HISTORY OF PRESENT ILLNESS: History  of Present Illness [name] is a 53 year old female who presents for follow-up after a recent hospitalization for elevated lactic acid levels.  She was hospitalized for a week due to elevated lactic acid levels, initially suspected to be due to an infection, but no infection was identified despite multiple antibiotics. On the day of hospitalization, she consumed a  [employer] and Cheese and a small slice of cake, and no new medications  She notes weight loss with changes in her chest appearance but denies new breast pain or discomfort. She experiences anxiety related to scans and regular blood tests, including Signatera, and has had issues with blood draws at home.  She is considering scheduling another colonoscopy, as it has been approximately three years since a mass was removed in late 2021 or early 2022.     ALLERGIES:  is allergic to carboplatin .  MEDICATIONS:  Current Outpatient Medications  Medication Sig Dispense Refill   docusate sodium  (COLACE) 100 MG capsule Take 100 mg by mouth at bedtime.     EPINEPHrine  0.3 mg/0.3 mL IJ SOAJ injection Inject 0.3 mg into the muscle as needed (worsening symptoms). 2 each 2   gabapentin  (NEURONTIN ) 100 MG capsule Take 200 mg by mouth at bedtime.     gabapentin  (NEURONTIN ) 100 MG capsule Take 2 capsules (200 mg total) by mouth at bedtime. (Patient taking differently: Take 200 mg by mouth at bedtime. 600 nightly) 60 capsule 1   No current facility-administered medications for this visit.    PHYSICAL EXAMINATION: ECOG PERFORMANCE STATUS: 1 - Symptomatic but completely ambulatory  Vitals:   11/01/23 1056  BP: 90/68  Pulse: 90  Resp: 18  Temp: 98.7 F (37.1 C)  SpO2: 100%   Filed Weights   11/01/23 1056  Weight: 183 lb 1.6 oz (83.1 kg)   Physical examination: Chest examination no palpable lumps lungs bilateral chest wall axilla  LABORATORY DATA:  I have reviewed the data as listed    Latest Ref Rng & Units 08/27/2023    5:00 AM 08/26/2023    5:21 AM 08/25/2023    5:01 AM  CMP  Glucose 70 - 99 mg/dL 887  888  882   BUN 6 - 20 mg/dL 9  10  11    Creatinine 0.44 - 1.00 mg/dL 9.49  9.41  9.40   Sodium 135 - 145 mmol/L 137  143  138   Potassium 3.5 - 5.1 mmol/L 3.3  3.6  3.0   Chloride 98 - 111 mmol/L 102  110  106   CO2 22 - 32 mmol/L 25  25  25    Calcium 8.9 - 10.3 mg/dL 8.4  8.4  8.6      Lab Results  Component Value Date   WBC 3.9 (L) 08/27/2023   HGB 11.8 (L) 08/27/2023   HCT 38.4 08/27/2023   MCV 91.0 08/27/2023   PLT 145 (L) 08/27/2023   NEUTROABS 7.4 08/24/2023    ASSESSMENT & PLAN:  Malignant neoplasm of upper-outer quadrant of left breast in female, estrogen receptor negative (HCC) 06/19/2016: left breast upper outer quadrant and left axillary lymph node biopsy 06/19/2016, both positive for a clinical T2-T3 N1, stage IIIB-C invasive ductal carcinoma, grade 3, triple negative, with an MIB-1 of 90%, BRCA2 mutation 07/20/2016-09/10/2016: Neoadjuvant dose dense AC followed by Taxol  carbo 01/27/2017: Left lumpectomy: Complete pathologic response 5 lymph nodes negative 03/11/2017-04/21/2017: Adjuvant radiation 11/24/2019: Left breast recurrence/new primary: Grade 3 IDC ER 80%, HER2 negative, PR negative, Ki-67 90% 01/2020:  Bilateral mastectomies: Left: Stage IIb grade 3 IDC weak ER positive 02/19/2020 CMF x1 (stopped due to surgical wound dehiscence), foundation 1: BRCA2 mutation, a PTEN loss of exon 1 and RB1R 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  ERV B2 and PI K3 CA were negative, and the microsatellite status was stable, with low mutational burden (4/MB) Protracted healing from mastectomies Bilateral salpingo-oophorectomy March 2023 Hospitalization: 08/23/2023-08/27/2023: Anaphylaxis to unknown substance  Current treatment:  Anastrozole  started 04/18/2020 Anastrozole  toxicities: Body aches and pains.   Olaparib  08/13/2020-10/09/21    Breast cancer surveillance: 1.  Breast exam 10/12/2022: Benign 2. no role of imaging since she had bilateral mastectomies.   July 2024: Signatera testing: Neg February 2025: Insufficient sample for Signatera.  Phlebotomist had a difficult time getting blood.  Therefore she would like to do the Signatera testing at our clinic.  We will set her up for that.   Her daughter who took care of her during her cancer journey has been accepted to [employer] and due for  med school.  She is very excited about that.   She will follow-up with the gastroenterology clinic about the colonoscopy. Return to clinic in 1 year for follow-up  No orders of the defined types were placed in this encounter.  The patient has a good understanding of the overall plan. she agrees with it. she will call with any problems that may develop before the next visit here.  I personally spent a total of 30 minutes in the care of the patient today including preparing to see the patient, getting/reviewing separately obtained history, performing a medically appropriate exam/evaluation, counseling and educating, placing orders, referring and communicating with other health care professionals, documenting clinical information in the EHR, independently interpreting results, communicating results, and coordinating care.   Viinay K Vidit Boissonneault, MD 11/01/23

## 2023-11-03 NOTE — Telephone Encounter (Signed)
 PT is scheduled for 12/11

## 2023-11-12 ENCOUNTER — Encounter: Payer: Self-pay | Admitting: *Deleted

## 2023-11-12 DIAGNOSIS — C50919 Malignant neoplasm of unspecified site of unspecified female breast: Secondary | ICD-10-CM | POA: Insufficient documentation

## 2023-11-30 ENCOUNTER — Ambulatory Visit: Admitting: *Deleted

## 2023-11-30 ENCOUNTER — Telehealth: Payer: Self-pay | Admitting: *Deleted

## 2023-11-30 ENCOUNTER — Other Ambulatory Visit (HOSPITAL_COMMUNITY): Payer: Self-pay

## 2023-11-30 VITALS — Ht 67.0 in | Wt 177.0 lb

## 2023-11-30 DIAGNOSIS — Z860100 Personal history of colon polyps, unspecified: Secondary | ICD-10-CM

## 2023-11-30 DIAGNOSIS — Z800 Family history of malignant neoplasm of digestive organs: Secondary | ICD-10-CM

## 2023-11-30 MED ORDER — NA SULFATE-K SULFATE-MG SULF 17.5-3.13-1.6 GM/177ML PO SOLN
1.0000 | Freq: Once | ORAL | 0 refills | Status: AC
  Filled 2023-11-30 – 2023-12-13 (×2): qty 354, 1d supply, fill #0

## 2023-11-30 NOTE — Progress Notes (Signed)
 Pt's name and DOB verified at the beginning of the pre-visit with 2 identifiers  Pt denies any difficulty with ambulating,sitting, laying down or rolling side to side  Pt has no issues moving head neck or swallowing  No egg or soy allergy known to patient   No issues known to pt with past sedation did have one episode of aspiration 15 years ago but denies any issues now   No FH of Malignant Hyperthermia  Pt is not on home 02   Pt is not on blood thinners   Pt has frequent issues with constipation RN instructed pt to use Miralax  per bottles instructions a week before prep days. Pt states they will  Pt is not on dialysis  Pt denise any abnormal heart rhythms   Pt denies any upcoming cardiac testing  Patient's chart reviewed by Norleen Schillings CNRA prior to pre-visit and patient appropriate for the [facility].  Pre-visit completed and red dot placed by patient's name on their procedure day (on provider's schedule).    Visit by phone  Pt states weight is 177 lb   Pt given  both [facility] main # and MD on call # prior to instructions.  Informed pt to come in at the time discussed and is shown on PV instructions.  Pt instructed to use Singlecare.com or GoodRx for a price reduction on prep  Instructed pt where to find PV instructions in My Chart..  Instructed pt on all aspects of written instructions including med holds clothing to wear and foods to eat and not eat as well as after procedure legal restrictions and to call MD on call if needed.. Pt states understanding. Instructed pt to review instructions again prior to procedure and call main # given if has any questions or any issues. Pt states they will.

## 2023-11-30 NOTE — Telephone Encounter (Signed)
 Attempt to reach pt for pre-visit. LM with call back #.   Will attempt other number in profile LM  Will attempt to reach again in 5 min due to no other # listed in profile  Third attempt to reach pt reached   .

## 2023-12-10 ENCOUNTER — Other Ambulatory Visit (HOSPITAL_COMMUNITY): Payer: Self-pay

## 2023-12-13 ENCOUNTER — Other Ambulatory Visit (HOSPITAL_COMMUNITY): Payer: Self-pay

## 2023-12-14 NOTE — Progress Notes (Unsigned)
 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 The patient agreed with the plan.   HPI: [name] is a 53 y.o. 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  Patient underwent colonoscopy in 2021 that disclosed a 2.5 cm polyp in the proximal ascending colon adjacent to the ICV.  Initial biopsies showed tubular adenoma with high-grade dysplasia.  EMR was performed for which pathology showed tubulovillous adenoma without high-grade dysplasia.  Last colonoscopy in 2023 disclosed 2 tubular adenomas.  One was documented to be at the site of prior polyp.  Family history of colorectal cancer in a first-degree relative-father age 47.  No family history of colon polyps.   Past Medical History:  Diagnosis Date   Anxiety    Arthritis    Breast cancer (HCC) 11/24/2019   Bilateral breasts   Complication of anesthesia    DAY SURGERY 2010 OR 2011 ASPIRATED AND STAYED OVERNIGHT   Constipation    Depression    History of radiation therapy 03/11/17- 04/21/17   Left breast, 2 Gy in 25 fractions for a total dose of 50 Gy, Boost, 2 Gy in 5 fractions for a total dose of 10 Gy   Hypertension    OFF MEDS  SINCE CHEMO X 5 MONTHS   Malignant neoplasm of upper-outer quadrant of left breast in female, estrogen receptor negative (HCC) 06/23/2016    Past Surgical History:  Procedure Laterality Date   ANKLE SURGERY Right    BREAST CYST EXCISION Right 07/15/2020   Procedure: Excision excess right axillary tissue;  Surgeon: Elisabeth Craig RAMAN, MD;  Location: [facility] OR;  Service: Plastics;  Laterality:  Right;   BREAST LUMPECTOMY Left 01/27/2017   BREAST LUMPECTOMY WITH RADIOACTIVE SEED AND SENTINEL LYMPH NODE BIOPSY Left 01/27/2017   Procedure: LEFT BREAST LUMPECTOMY WITH RADIOACTIVE SEED LOCALIZATION, LEFT AXILLARY DEEP LYMPH NODE BIOPSY WITH RADIOACTIVE SEED LOCALIZATION, LEFT AXILLARY SENTINEL LYMPH NODE MAPPING AND BIOPSY WITH BLUE DYE INJECTION;  Surgeon: Gail Favorite, MD;  Location: [facility] OR;  Service: General;  Laterality: Left;   COLONOSCOPY WITH PROPOFOL  N/A 02/01/2020   Procedure: COLONOSCOPY WITH PROPOFOL ;  Surgeon: Teressa Toribio SQUIBB, MD;  Location: [facility];  Service: Endoscopy;  Laterality: N/A;   DEBRIDEMENT AND CLOSURE WOUND Left 03/28/2020   Procedure: DEBRIDEMENT AND CLOSURE OF MASTECTOMY WOUND;  Surgeon: Ebbie Cough, MD;  Location: [facility];  Service: General;  Laterality: Left;   DEBRIDEMENT AND CLOSURE WOUND Left 07/15/2020   Procedure: Debridement of left chest wound and rotational flap closure;  Surgeon: Elisabeth Craig RAMAN, MD;  Location: [facility] OR;  Service: Plastics;  Laterality: Left;  1.5 hour   ENDOSCOPIC MUCOSAL RESECTION  02/01/2020   Procedure: ENDOSCOPIC MUCOSAL RESECTION;  Surgeon: Teressa Toribio SQUIBB, MD;  Location: [facility];  Service: Endoscopy;;   HEMOSTASIS CLIP PLACEMENT  02/01/2020   Procedure: HEMOSTASIS CLIP PLACEMENT;  Surgeon: Teressa Toribio SQUIBB, MD;  Location: [facility];  Service: Endoscopy;;   INCISIONAL HERNIA REPAIR N/A 02/26/2021   Procedure: LAPAROSCOPIC INCISIONAL HERNIA WITH MESH;  Surgeon: Ebbie Cough, MD;  Location: [facility];  Service: General;  Laterality: N/A;   IR FLUORO GUIDE PORT  INSERTION RIGHT  07/14/2016   IR US  GUIDE VASC ACCESS RIGHT  07/14/2016   MASTECTOMY W/ SENTINEL NODE BIOPSY Bilateral 01/09/2020   Procedure: BILATERAL MASTECTOMY WITH LEFT AXILLARY SENTINEL LYMPH NODE BIOPSY;  Surgeon: Ebbie Cough, MD;  Location: [facility];  Service: General;  Laterality: Bilateral;   PORT-A-CATH REMOVAL Right  01/27/2017   Procedure: REMOVAL PORT-A-CATH;  Surgeon: Gail Favorite, MD;  Location: [facility] OR;  Service: General;  Laterality: Right;   PORT-A-CATH REMOVAL Right 02/26/2021   Procedure: REMOVAL PORT-A-CATH;  Surgeon: Ebbie Cough, MD;  Location: [facility];  Service: General;  Laterality: Right;   PORTACATH PLACEMENT Right 01/09/2020   Procedure: INSERTION PORT-A-CATH;  Surgeon: Ebbie Cough, MD;  Location: [facility];  Service: General;  Laterality: Right;   ROBOTIC ASSISTED TOTAL HYSTERECTOMY WITH BILATERAL SALPINGO OOPHERECTOMY N/A 02/26/2021   Procedure: XI ROBOTIC ASSISTED BILATERAL SALPINGO OOPHORECTOMY;  Surgeon: Viktoria Comer SAUNDERS, MD;  Location: [facility];  Service: Gynecology;  Laterality: N/A;   SUBMUCOSAL LIFTING INJECTION  02/01/2020   Procedure: SUBMUCOSAL LIFTING INJECTION;  Surgeon: Teressa Toribio SQUIBB, MD;  Location: [facility];  Service: Endoscopy;;   TUBAL LIGATION     WISDOM TOOTH EXTRACTION     WOUND EXPLORATION N/A 10/01/2022   Procedure: ABDOMINAL EXPLORATION;  Surgeon: Lorretta Dess, MD;  Location: [facility];  Service: Plastics;  Laterality: N/A;    Prior to Admission medications   Medication Sig Start Date End Date Taking? Authorizing Provider  ALOE PO Take by mouth daily at 12 noon.    [provider]  docusate sodium  (COLACE) 100 MG capsule Take 100 mg by mouth at bedtime.    [provider]  EPINEPHrine  0.3 mg/0.3 mL IJ SOAJ injection Inject 0.3 mg into the muscle as needed (worsening symptoms). 08/27/23   Shalhoub, Zachary PARAS, MD  gabapentin  (NEURONTIN ) 100 MG capsule Take 200 mg by mouth at bedtime. Patient taking differently: Take 100 mg by mouth as needed.    [provider]  gabapentin  (NEURONTIN ) 100 MG capsule Take 2 capsules (200 mg total) by mouth at bedtime. Patient taking differently: Take 600 mg by mouth at bedtime. 09/14/23     Na Sulfate-K Sulfate-Mg Sulfate concentrate (SUPREP) 17.5-3.13-1.6 GM/177ML SOLN Take 1  kit (354 mLs total) by mouth once for 1 dose. 11/30/23 12/14/23  Suzann Inocente HERO, MD  OVER THE COUNTER MEDICATION daily at 12 noon. Spark suppliment with B vitamins Non caffeine energy drink    [provider]  OVER THE COUNTER MEDICATION daily at 12 noon. Fiber gummies    [provider]    Current Outpatient Medications  Medication Sig Dispense Refill   ALOE PO Take by mouth daily at 12 noon.     docusate sodium  (COLACE) 100 MG capsule Take 100 mg by mouth at bedtime.     EPINEPHrine  0.3 mg/0.3 mL IJ SOAJ injection Inject 0.3 mg into the muscle as needed (worsening symptoms). 2 each 2   gabapentin  (NEURONTIN ) 100 MG capsule Take 200 mg by mouth at bedtime. (Patient taking differently: Take 100 mg by mouth as needed.)     gabapentin  (NEURONTIN ) 100 MG capsule Take 2 capsules (200 mg total) by mouth at bedtime. (Patient taking differently: Take 600 mg by mouth at bedtime.) 60 capsule 1   Na Sulfate-K Sulfate-Mg Sulfate concentrate (SUPREP) 17.5-3.13-1.6 GM/177ML SOLN Take 1 kit (354 mLs total) by mouth once for 1 dose. 354 mL 0   OVER THE COUNTER MEDICATION daily at 12 noon. Spark  suppliment with B vitamins Non caffeine energy drink     OVER THE COUNTER MEDICATION daily at 12 noon. Fiber gummies     No current facility-administered medications for this visit.    Allergies as of 12/16/2023 - Review Complete 11/30/2023  Allergen Reaction Noted   Carboplatin  Other (See Comments) 01/20/2017    Family History  Problem Relation Age of Onset   Diabetes Mother    Hypertension Mother    Arthritis Mother    Colon cancer Father 58       died .76 metastatic at time of diagnosis   Diabetes Father    Heart disease Father    Hypertension Father    Breast cancer Maternal Grandmother 56       d.60s   Breast cancer Maternal Aunt 52   Cervical cancer Maternal Aunt 55   Breast cancer Maternal Aunt 46       d.50s   Cancer Maternal Uncle        d.62s unspecified type of cancer    Breast cancer Cousin 25       paternal first-cousin (daughter of unaffected aunt)   Cancer Maternal Aunt 55       Female Cancer   Cancer Maternal Aunt        unknown cancer   Brain cancer Daughter 1   Esophageal cancer Other    Colon polyps Neg Hx    Rectal cancer Neg Hx    Stomach cancer Neg Hx     Social History   Socioeconomic History   Marital status: Married    Spouse name: Not on file   Number of children: Not on file   Years of education: Not on file   Highest education level: Not on file  Occupational History   Not on file  Tobacco Use   Smoking status: Never   Smokeless tobacco: Never  Vaping Use   Vaping status: Never Used  Substance and Sexual Activity   Alcohol use: Yes    Comment: rare   Drug use: No   Sexual activity: Not Currently  Other Topics Concern   Not on file  Social History Narrative   Not on file   Social Drivers of Health   Financial Resource Strain: Not on file  Food Insecurity: No Food Insecurity (08/30/2023)   Hunger Vital Sign    Worried About Running Out of Food in the Last Year: Never true    Ran Out of Food in the Last Year: Never true  Transportation Needs: No Transportation Needs (08/30/2023)   PRAPARE - Administrator, Civil Service (Medical): No    Lack of Transportation (Non-Medical): No  Physical Activity: Not on file  Stress: Not on file  Social Connections: Not on file  Intimate Partner Violence: Not At Risk (08/30/2023)   Humiliation, Afraid, Rape, and Kick questionnaire    Fear of Current or Ex-Partner: No    Emotionally Abused: No    Physically Abused: No    Sexually Abused: No    Review of Systems:  All other review of systems negative except as mentioned in the HPI.  Physical Exam: Vital signs LMP 06/25/2016   General:   Alert,  Well-developed, well-nourished, pleasant and cooperative in NAD Airway:  Mallampati 2 Lungs:  Clear throughout to auscultation.   Heart:  Regular rate and rhythm; no  murmurs, clicks, rubs,  or gallops. Abdomen:  Soft, nontender and nondistended. Normal bowel sounds.   Neuro/Psych:  Normal mood and affect.  A and O x 3  Inocente Hausen, MD [facility]

## 2023-12-16 ENCOUNTER — Encounter: Payer: Self-pay | Admitting: Pediatrics

## 2023-12-16 ENCOUNTER — Ambulatory Visit: Admitting: Pediatrics

## 2023-12-16 ENCOUNTER — Other Ambulatory Visit: Payer: Self-pay

## 2023-12-16 ENCOUNTER — Other Ambulatory Visit: Payer: Self-pay | Admitting: Hematology and Oncology

## 2023-12-16 ENCOUNTER — Encounter: Payer: Self-pay | Admitting: Hematology and Oncology

## 2023-12-16 ENCOUNTER — Other Ambulatory Visit (HOSPITAL_COMMUNITY): Payer: Self-pay

## 2023-12-16 ENCOUNTER — Other Ambulatory Visit: Payer: Self-pay | Admitting: *Deleted

## 2023-12-16 VITALS — BP 118/58 | HR 73 | Temp 97.2°F | Resp 15 | Ht 67.0 in | Wt 177.0 lb

## 2023-12-16 DIAGNOSIS — Z800 Family history of malignant neoplasm of digestive organs: Secondary | ICD-10-CM

## 2023-12-16 DIAGNOSIS — Z860101 Personal history of adenomatous and serrated colon polyps: Secondary | ICD-10-CM | POA: Diagnosis not present

## 2023-12-16 DIAGNOSIS — Z1211 Encounter for screening for malignant neoplasm of colon: Secondary | ICD-10-CM

## 2023-12-16 DIAGNOSIS — Z860100 Personal history of colon polyps, unspecified: Secondary | ICD-10-CM

## 2023-12-16 MED ORDER — SODIUM CHLORIDE 0.9 % IV SOLN: 500.0000 mL | INTRAVENOUS | Status: DC

## 2023-12-16 MED ORDER — GABAPENTIN 300 MG PO CAPS
600.0000 mg | ORAL_CAPSULE | Freq: Every day | ORAL | 2 refills | Status: AC
  Filled 2023-12-16: qty 180, 90d supply, fill #0

## 2023-12-16 MED ORDER — TRAMADOL HCL 50 MG PO TABS
50.0000 mg | ORAL_TABLET | Freq: Four times a day (QID) | ORAL | 0 refills | Status: AC | PRN
  Filled 2023-12-16: qty 60, 8d supply, fill #0

## 2023-12-16 NOTE — Progress Notes (Signed)
 Received call from pt stating when she was hospitalized, they removed her prescription for Gabapentin  600 mg p.o Q HS as well as Tramadol  50 mg p.o PRN.  Per MD okay to refill Gabapentin  and MD will send over for Tramadol .  Pt educated and verbalized understanding.

## 2023-12-16 NOTE — Progress Notes (Signed)
 Pt's states no medical or surgical changes since previsit or office visit.

## 2023-12-16 NOTE — Progress Notes (Signed)
 Tramadol  refilled on behalf of Dr Odean.  [name]

## 2023-12-16 NOTE — Patient Instructions (Signed)
-  repeat colonoscopy in 5 years for surveillance recommended.    -Continue present medications   YOU HAD AN ENDOSCOPIC PROCEDURE TODAY AT THE [facility]:   Refer to the procedure report that was given to you for any specific questions about what was found during the examination.  If the procedure report does not answer your questions, please call your gastroenterologist to clarify.  If you requested that your care partner not be given the details of your procedure findings, then the procedure report has been included in a sealed envelope for you to review at your convenience later.  YOU SHOULD EXPECT: Some feelings of bloating in the abdomen. Passage of more gas than usual.  Walking can help get rid of the air that was put into your GI tract during the procedure and reduce the bloating. If you had a lower endoscopy (such as a colonoscopy or flexible sigmoidoscopy) you may notice spotting of blood in your stool or on the toilet paper. If you underwent a bowel prep for your procedure, you may not have a normal bowel movement for a few days.  Please Note:  You might notice some irritation and congestion in your nose or some drainage.  This is from the oxygen used during your procedure.  There is no need for concern and it should clear up in a day or so.  SYMPTOMS TO REPORT IMMEDIATELY:  Following lower endoscopy (colonoscopy or flexible sigmoidoscopy):  Excessive amounts of blood in the stool  Significant tenderness or worsening of abdominal pains  Swelling of the abdomen that is new, acute  Fever of 100F or higher   For urgent or emergent issues, a gastroenterologist can be reached at any hour by calling [phone]. Do not use MyChart messaging for urgent concerns.    DIET:  We do recommend a small meal at first, but then you may proceed to your regular diet.  Drink plenty of fluids but you should avoid alcoholic beverages for 24 hours.  ACTIVITY:  You should plan to take it  easy for the rest of today and you should NOT DRIVE or use heavy machinery until tomorrow (because of the sedation medicines used during the test).    FOLLOW UP: Our staff will call the number listed on your records the next business day following your procedure.  We will call around 7:15- 8:00 am to check on you and address any questions or concerns that you may have regarding the information given to you following your procedure. If we do not reach you, we will leave a message.     If any biopsies were taken you will be contacted by phone or by letter within the next 1-3 weeks.  Please call us at [phone] if you have not heard about the biopsies in 3 weeks.    SIGNATURES/CONFIDENTIALITY: You and/or your care partner have signed paperwork which will be entered into your electronic medical record.  These signatures attest to the fact that that the information above on your After Visit Summary has been reviewed and is understood.  Full responsibility of the confidentiality of this discharge information lies with you and/or your care-partner.

## 2023-12-16 NOTE — Op Note (Signed)
 [facility] Patient Name: [name] Procedure Date: 12/16/2023 11:19 AM MRN: [id number] Endoscopist: Inocente Hausen , MD, [id number] Age: 53 Referring MD:  Date of Birth: Aug 13, 1970 Gender: Female Account #: [id number] Procedure:                Colonoscopy Indications:              High risk colon cancer surveillance: Personal                            history of adenoma with high grade dysplasia, High                            risk colon cancer surveillance: Personal history of                            adenoma with villous component, Last colonoscopy:                            January 2023; 2.5 cm polyp at ICV status post EMR                            in 2021 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 Colonoscopy 2023 showed small                            residual polyp at prior polypectomy site. Family                            history of colorectal cancer in first-degree                            relativefather age 30 Medicines:                Monitored Anesthesia Care Procedure:                Pre-Anesthesia Assessment:                           - Prior to the procedure, a History and Physical                            was performed, and patient medications and                            allergies were reviewed. The patient's tolerance of                            previous anesthesia was also reviewed. The risks                            and benefits of the procedure and the sedation  options and risks were discussed with the patient.                            All questions were answered, and informed consent                            was obtained. Prior Anticoagulants: The patient has                            taken no anticoagulant or antiplatelet agents. ASA                            Grade Assessment: II - A patient with mild systemic                            disease.  After reviewing the risks and benefits,                            the patient was deemed in satisfactory condition to                            undergo the procedure.                           After obtaining informed consent, the colonoscope                            was passed under direct vision. Throughout the                            procedure, the patient's blood pressure, pulse, and                            oxygen  saturations were monitored continuously. The                            Olympus Scope SN: [id number] was introduced through                            the anus and advanced to the terminal ileum. The                            colonoscopy was performed without difficulty. The                            patient tolerated the procedure well. The quality                            of the bowel preparation was good. The terminal                            ileum, ileocecal valve, appendiceal orifice, and  rectum were photographed. Scope In: 11:34:06 AM Scope Out: 11:45:18 AM Scope Withdrawal Time: 0 hours 8 minutes 44 seconds  Total Procedure Duration: 0 hours 11 minutes 12 seconds  Findings:                 The perianal and digital rectal examinations were                            normal. Pertinent negatives include normal                            sphincter tone and no palpable rectal lesions.                           The colon (entire examined portion) appeared                            normal. The site of previous large polyp at ICV was                            carefully inspected in multiple views and there was                            no evidence of recurrent polyp growth at that site.                           The terminal ileum appeared normal.                           The retroflexed view of the distal rectum and anal                            verge was normal and showed no anal or rectal                             abnormalities. Complications:            No immediate complications. Estimated blood loss:                            None. Estimated Blood Loss:     Estimated blood loss: none. Impression:               - The entire examined colon is normal. The site of                            previous large polyp at ICV was carefully inspected                            in multiple views and there was no evidence of                            recurrent polyp growth at that site.                           -  The examined portion of the ileum was normal.                           - The distal rectum and anal verge are normal on                            retroflexion view.                           - No specimens collected. Recommendation:           - Discharge patient to home (ambulatory).                           - Repeat colonoscopy in 5 years for surveillance.                           - The findings and recommendations were discussed                            with the patient's family.                           - Patient has a contact number available for                            emergencies. The signs and symptoms of potential                            delayed complications were discussed with the                            patient. Return to normal activities tomorrow.                            Written discharge instructions were provided to the                            patient. Inocente Hausen, MD 12/16/2023 11:50:51 AM This report has been signed electronically.

## 2023-12-17 ENCOUNTER — Telehealth: Payer: Self-pay

## 2023-12-17 NOTE — Telephone Encounter (Signed)
°  Follow up Call-     12/16/2023   10:41 AM  Call back number  Post procedure Call Back phone  # [phone]  Permission to leave phone message Yes     Follow up call, LVM

## 2024-03-16 ENCOUNTER — Inpatient Hospital Stay

## 2024-03-16 ENCOUNTER — Inpatient Hospital Stay: Admitting: Hematology and Oncology
